# Patient Record
Sex: Female | Born: 1935
Health system: Southern US, Community
[De-identification: ages and names within clinical notes are randomized; demographics above are authoritative.]

## PROBLEM LIST (undated history)

## (undated) DIAGNOSIS — I10 Essential (primary) hypertension: Secondary | ICD-10-CM

## (undated) DIAGNOSIS — R7611 Nonspecific reaction to tuberculin skin test without active tuberculosis: Secondary | ICD-10-CM

## (undated) DIAGNOSIS — R197 Diarrhea, unspecified: Secondary | ICD-10-CM

## (undated) DIAGNOSIS — E1129 Type 2 diabetes mellitus with other diabetic kidney complication: Secondary | ICD-10-CM

## (undated) DIAGNOSIS — J069 Acute upper respiratory infection, unspecified: Secondary | ICD-10-CM

## (undated) DIAGNOSIS — Z9289 Personal history of other medical treatment: Secondary | ICD-10-CM

## (undated) DIAGNOSIS — I639 Cerebral infarction, unspecified: Secondary | ICD-10-CM

## (undated) DIAGNOSIS — I739 Peripheral vascular disease, unspecified: Secondary | ICD-10-CM

## (undated) DIAGNOSIS — E785 Hyperlipidemia, unspecified: Secondary | ICD-10-CM

## (undated) DIAGNOSIS — R079 Chest pain, unspecified: Secondary | ICD-10-CM

## (undated) DIAGNOSIS — D649 Anemia, unspecified: Secondary | ICD-10-CM

## (undated) DIAGNOSIS — M899 Disorder of bone, unspecified: Secondary | ICD-10-CM

## (undated) DIAGNOSIS — I70209 Unspecified atherosclerosis of native arteries of extremities, unspecified extremity: Secondary | ICD-10-CM

## (undated) DIAGNOSIS — R809 Proteinuria, unspecified: Secondary | ICD-10-CM

## (undated) DIAGNOSIS — B0229 Other postherpetic nervous system involvement: Secondary | ICD-10-CM

## (undated) DIAGNOSIS — I96 Gangrene, not elsewhere classified: Secondary | ICD-10-CM

## (undated) DIAGNOSIS — M25569 Pain in unspecified knee: Secondary | ICD-10-CM

## (undated) DIAGNOSIS — N289 Disorder of kidney and ureter, unspecified: Secondary | ICD-10-CM

## (undated) DIAGNOSIS — I129 Hypertensive chronic kidney disease with stage 1 through stage 4 chronic kidney disease, or unspecified chronic kidney disease: Secondary | ICD-10-CM

## (undated) DIAGNOSIS — E1165 Type 2 diabetes mellitus with hyperglycemia: Secondary | ICD-10-CM

## (undated) DIAGNOSIS — E119 Type 2 diabetes mellitus without complications: Secondary | ICD-10-CM

## (undated) DIAGNOSIS — M1990 Unspecified osteoarthritis, unspecified site: Secondary | ICD-10-CM

## (undated) DIAGNOSIS — M949 Disorder of cartilage, unspecified: Secondary | ICD-10-CM

## (undated) DIAGNOSIS — N182 Chronic kidney disease, stage 2 (mild): Secondary | ICD-10-CM

## (undated) HISTORY — DX: Other postherpetic nervous system involvement: B02.29

## (undated) HISTORY — DX: Nonspecific reaction to tuberculin skin test without active tuberculosis: R76.11

## (undated) HISTORY — DX: Disorder of kidney and ureter, unspecified: N28.9

## (undated) HISTORY — DX: Hyperlipidemia, unspecified: E78.5

## (undated) HISTORY — DX: Unspecified atherosclerosis of native arteries of extremities, unspecified extremity: I70.209

## (undated) HISTORY — DX: Type 2 diabetes mellitus with other diabetic kidney complication: E11.29

## (undated) HISTORY — DX: Essential (primary) hypertension: I10

## (undated) HISTORY — PX: TONSILLECTOMY: SUR1361

## (undated) HISTORY — PX: OTHER SURGICAL HISTORY: SHX169

## (undated) HISTORY — DX: Type 2 diabetes mellitus with hyperglycemia: E11.65

## (undated) HISTORY — DX: Diarrhea, unspecified: R19.7

## (undated) HISTORY — DX: Proteinuria, unspecified: R80.9

## (undated) HISTORY — DX: Acute upper respiratory infection, unspecified: J06.9

## (undated) HISTORY — DX: Hypercalcemia: E83.52

## (undated) HISTORY — DX: Hypertensive chronic kidney disease with stage 1 through stage 4 chronic kidney disease, or unspecified chronic kidney disease: I12.9

## (undated) HISTORY — DX: Chest pain, unspecified: R07.9

## (undated) HISTORY — DX: Disorder of bone, unspecified: M94.9

## (undated) HISTORY — DX: Peripheral vascular disease, unspecified: I73.9

## (undated) HISTORY — DX: Pain in unspecified knee: M25.569

## (undated) HISTORY — DX: Anemia, unspecified: D64.9

## (undated) HISTORY — DX: Chronic kidney disease, stage 2 (mild): N18.2

## (undated) HISTORY — DX: Disorder of bone, unspecified: M89.9

## (undated) HISTORY — DX: Cerebral infarction, unspecified: I63.9

## (undated) HISTORY — PX: COLONOSCOPY: SHX174

---

## 2000-01-29 ENCOUNTER — Ambulatory Visit (HOSPITAL_COMMUNITY): Admission: RE | Admit: 2000-01-29 | Discharge: 2000-01-29 | Payer: Self-pay | Admitting: Family Medicine

## 2001-05-28 ENCOUNTER — Emergency Department (HOSPITAL_COMMUNITY): Admission: EM | Admit: 2001-05-28 | Discharge: 2001-05-28 | Payer: Self-pay | Admitting: Emergency Medicine

## 2001-05-28 ENCOUNTER — Encounter: Payer: Self-pay | Admitting: Emergency Medicine

## 2004-02-20 ENCOUNTER — Encounter: Admission: RE | Admit: 2004-02-20 | Discharge: 2004-02-20 | Payer: Self-pay | Admitting: Cardiology

## 2004-03-03 ENCOUNTER — Ambulatory Visit (HOSPITAL_COMMUNITY): Admission: RE | Admit: 2004-03-03 | Discharge: 2004-03-03 | Payer: Self-pay | Admitting: *Deleted

## 2004-03-09 ENCOUNTER — Encounter: Admission: RE | Admit: 2004-03-09 | Discharge: 2004-03-09 | Payer: Self-pay | Admitting: Cardiology

## 2004-04-06 ENCOUNTER — Encounter: Admission: RE | Admit: 2004-04-06 | Discharge: 2004-07-05 | Payer: Self-pay | Admitting: Cardiology

## 2004-05-01 ENCOUNTER — Ambulatory Visit (HOSPITAL_COMMUNITY): Admission: RE | Admit: 2004-05-01 | Discharge: 2004-05-01 | Payer: Self-pay | Admitting: Cardiology

## 2004-06-25 ENCOUNTER — Encounter (HOSPITAL_COMMUNITY): Admission: RE | Admit: 2004-06-25 | Discharge: 2004-09-23 | Payer: Self-pay | Admitting: Gastroenterology

## 2004-07-02 ENCOUNTER — Encounter (INDEPENDENT_AMBULATORY_CARE_PROVIDER_SITE_OTHER): Payer: Self-pay | Admitting: *Deleted

## 2004-07-02 ENCOUNTER — Ambulatory Visit (HOSPITAL_COMMUNITY): Admission: RE | Admit: 2004-07-02 | Discharge: 2004-07-02 | Payer: Self-pay | Admitting: Gastroenterology

## 2005-01-30 ENCOUNTER — Emergency Department (HOSPITAL_COMMUNITY): Admission: EM | Admit: 2005-01-30 | Discharge: 2005-01-30 | Payer: Self-pay | Admitting: Emergency Medicine

## 2006-02-14 ENCOUNTER — Encounter: Admission: RE | Admit: 2006-02-14 | Discharge: 2006-02-14 | Payer: Self-pay | Admitting: Cardiology

## 2006-09-05 ENCOUNTER — Emergency Department (HOSPITAL_COMMUNITY): Admission: EM | Admit: 2006-09-05 | Discharge: 2006-09-05 | Payer: Self-pay | Admitting: Emergency Medicine

## 2006-09-06 ENCOUNTER — Emergency Department (HOSPITAL_COMMUNITY): Admission: EM | Admit: 2006-09-06 | Discharge: 2006-09-06 | Payer: Self-pay | Admitting: Family Medicine

## 2007-01-10 ENCOUNTER — Emergency Department (HOSPITAL_COMMUNITY): Admission: EM | Admit: 2007-01-10 | Discharge: 2007-01-10 | Payer: Self-pay | Admitting: Emergency Medicine

## 2007-02-15 ENCOUNTER — Encounter: Admission: RE | Admit: 2007-02-15 | Discharge: 2007-02-15 | Payer: Self-pay | Admitting: Orthopedic Surgery

## 2007-11-17 ENCOUNTER — Encounter: Admission: RE | Admit: 2007-11-17 | Discharge: 2007-11-17 | Payer: Self-pay | Admitting: Family Medicine

## 2009-11-21 ENCOUNTER — Encounter: Admission: RE | Admit: 2009-11-21 | Discharge: 2009-11-21 | Payer: Self-pay | Admitting: Sports Medicine

## 2009-12-24 LAB — HM DEXA SCAN

## 2010-01-14 ENCOUNTER — Encounter: Admission: RE | Admit: 2010-01-14 | Discharge: 2010-01-14 | Payer: Self-pay | Admitting: Internal Medicine

## 2010-05-19 ENCOUNTER — Encounter: Admission: RE | Admit: 2010-05-19 | Discharge: 2010-05-19 | Payer: Self-pay | Admitting: Internal Medicine

## 2011-05-14 NOTE — Op Note (Signed)
NAME:  AVERII, Monique Cabrera                        ACCOUNT NO.:  [id number]   MEDICAL RECORD NO.:  [id number]                   PATIENT TYPE:  AMB   LOCATION:  ENDO                                 FACILITY:  [facility]   PHYSICIAN:  Jeryl Columbia, M.D.                 DATE OF BIRTH:  08-May-1935   DATE OF PROCEDURE:  07/02/2004  DATE OF DISCHARGE:                                 OPERATIVE REPORT   PROCEDURE:  Colonoscopy with polypectomy.   INDICATIONS:  Iron deficiency, guaiac positivity.   Consent was signed after risks, benefits, methods, options thoroughly  discussed in the office.   MEDICINES USED:  Demerol 70, Versed 7.   PROCEDURE:  Rectal inspection is pertinent for external hemorrhoids, small.  Digital exam was negative.  The video pediatric adjustable colonoscope was  inserted and easily advanced around the colon to the cecum.  This did  require some abdominal pressure but no position changes.  No signs of  bleeding were seen on insertion.  The cecum is identified by the appendiceal  orifice and the ileocecal valve.  The scope was inserted short-ways in the  terminal ileum, which was normal.  No blood was seen coming from above.  The  scope was slowly withdrawn.  The prep was adequate.  There was some liquid  stool that required washing and suctioning, but on slow withdrawal through  the colon, the cecum, ascending, transverse, and descending were normal.  As  the scope was withdrawn around the sigmoid, in the distal sigmoid, a small  polyp was seen.  Snare electrocautery was applied, and the polyp was  suctioned through the scope and collected in the trap.  There was a nice  white coagulum without any obvious residual polypoid tissue.  She had some  difficulty holding air.  No other abnormality was seen as we slowly withdrew  back to the rectum.  Anorectal pull-through and retroflexion confirmed some  small hemorrhoids.  The scope was reinserted short-ways up the left side of  the colon.  Air was suctioned.  Scope removed.  The patient tolerated the  procedure well.  There were no obvious immediate complications.   ENDOSCOPIC DIAGNOSIS:  1. Internal and external hemorrhoids.  2. One small sigmoid polyp, snared.  3. Otherwise within normal limits to the terminal ileum without any signs of     bleeding.   PLAN:  Will await pathology.  Probably recheck colon screening in five years  and continue workup with a one-time EGD.                                               Jeryl Columbia, M.D.    MEM/MEDQ  D:  07/02/2004  T:  07/02/2004  Job:  [id number]   cc:  Ardyth Gal. Spruill, M.D.  [address] [place] [address]  Fax: [phone]

## 2011-05-14 NOTE — Op Note (Signed)
NAME:  Monique Cabrera, Monique Cabrera                        ACCOUNT NO.:  [id number]   MEDICAL RECORD NO.:  [id number]                   PATIENT TYPE:  AMB   LOCATION:  ENDO                                 FACILITY:  [facility]   PHYSICIAN:  Jeryl Columbia, M.D.                 DATE OF BIRTH:  Sep 02, 1935   DATE OF PROCEDURE:  07/02/2004  DATE OF DISCHARGE:                                 OPERATIVE REPORT   PROCEDURE:  Esophagogastroduodenoscopy with biopsy.   INDICATIONS:  Upper tract symptoms, history of ulcers.  Iron deficiency and  guaiac positivity on Plavix.  Nondiagnostic colonoscopy.   Consent was signed prior to any premeds given after risks, benefits,  methods, options thoroughly discussed in the office.  Additional medicines  for this procedure, 2 of Versed only.   PROCEDURE:  The video endoscope was inserted by direct vision.  The  procedure mid esophagus was normal.  The distal esophagus did have a small  hiatal hernia with a widely patent small fibrous ring.  The scope was passed  easily into the stomach, where some atrophic gastritis was seen.  Advanced  through a normal antrum, normal pylorus, into a normal duodenal bulb and  around the C loop to a normal second portion of the duodenum.  No blood was  seen distally.  The scope was slowly withdrawn back into the bulb, and a  good look there ruled out abnormalities in all locations.  The scope was  withdrawn back to the stomach and retroflexed.  High in the cardia, a small  submucosal, probably lipoma, was seen with nice, smooth round circumference  and normal overlying mucosa.  This was right at the cardia.  Photo  documentation was obtained.  The fundus and angularis were normal, as were  the lesser and greater curve except for some atrophic gastritis.  Straight  visualization in the stomach confirmed the atrophic gastritis.  Ruled out  any other abnormalities.  A few biopsies of the gastritis were obtained.  Air was suctioned.  The  scope was slowly withdrawn.  Again, a good look at  the esophagus was normal.  The scope was removed.  The patient tolerated the  procedure well.  There were no obvious immediate complications.   ENDOSCOPIC DIAGNOSIS:  1. Small hiatal hernia with a widely patent ring.  2. Atrophic gastritis, status post biopsy.  3. Cardia, probably submucosal lipoma, small and smooth without worrisome     features.  4. Otherwise normal esophagogastroduodenoscopy without any bleeding.   PLAN:  Hold aspirin and Plavix for now.  Long-term pump inhibitors.  Will  try to use only one of the above at some point in the future if definitely  needed, per Dr. Montez Morita.  In the meantime, would go ahead and begin iron.  Follow up in six weeks to recheck guaiac and CBC symptoms and make sure no  further workup plans are needed.  I would like a one-time small bowel follow-  through or capsule endoscopy.                                               Jeryl Columbia, M.D.    MEM/MEDQ  D:  07/02/2004  T:  07/02/2004  Job:  [id number]   cc:   Ardyth Gal. Spruill, M.D.  [address] [place] [address]  Fax: [phone]

## 2011-12-03 LAB — HM MAMMOGRAPHY: HM Mammogram: NEGATIVE

## 2012-01-11 ENCOUNTER — Ambulatory Visit: Payer: Self-pay

## 2012-08-21 ENCOUNTER — Encounter (HOSPITAL_COMMUNITY): Payer: Self-pay

## 2012-08-21 ENCOUNTER — Emergency Department (HOSPITAL_COMMUNITY): Payer: Medicare Other

## 2012-08-21 ENCOUNTER — Emergency Department (HOSPITAL_COMMUNITY)
Admission: EM | Admit: 2012-08-21 | Discharge: 2012-08-21 | Disposition: A | Discharge status: Home Health | Payer: Medicare Other | Attending: Emergency Medicine | Admitting: Emergency Medicine

## 2012-08-21 DIAGNOSIS — Z7982 Long term (current) use of aspirin: Secondary | ICD-10-CM | POA: Insufficient documentation

## 2012-08-21 DIAGNOSIS — Z79899 Other long term (current) drug therapy: Secondary | ICD-10-CM | POA: Insufficient documentation

## 2012-08-21 DIAGNOSIS — I10 Essential (primary) hypertension: Secondary | ICD-10-CM | POA: Insufficient documentation

## 2012-08-21 DIAGNOSIS — E119 Type 2 diabetes mellitus without complications: Secondary | ICD-10-CM | POA: Insufficient documentation

## 2012-08-21 DIAGNOSIS — R109 Unspecified abdominal pain: Secondary | ICD-10-CM

## 2012-08-21 HISTORY — DX: Essential (primary) hypertension: I10

## 2012-08-21 LAB — CBC WITH DIFFERENTIAL/PLATELET
Basophils Relative: 0 % (ref 0–1)
Eosinophils Absolute: 0.1 10*3/uL (ref 0.0–0.7)
HCT: 37.2 % (ref 36.0–46.0)
Hemoglobin: 12.5 g/dL (ref 12.0–15.0)
Lymphs Abs: 1.8 10*3/uL (ref 0.7–4.0)
Monocytes Absolute: 0.6 10*3/uL (ref 0.1–1.0)
Platelets: 245 10*3/uL (ref 150–400)
RBC: 4.15 MIL/uL (ref 3.87–5.11)
RDW: 12.5 % (ref 11.5–15.5)
WBC: 6.7 10*3/uL (ref 4.0–10.5)

## 2012-08-21 LAB — BASIC METABOLIC PANEL
Calcium: 10.7 mg/dL — ABNORMAL HIGH (ref 8.4–10.5)
Chloride: 100 mEq/L (ref 96–112)
Creatinine, Ser: 1.12 mg/dL — ABNORMAL HIGH (ref 0.50–1.10)
GFR calc Af Amer: 54 mL/min — ABNORMAL LOW (ref >90)
GFR calc non Af Amer: 47 mL/min — ABNORMAL LOW (ref >90)

## 2012-08-21 LAB — URINALYSIS, ROUTINE W REFLEX MICROSCOPIC
Bilirubin Urine: NEGATIVE
Protein, ur: 100 mg/dL — AB
Specific Gravity, Urine: 1.021 (ref 1.005–1.030)
pH: 5.5 (ref 5.0–8.0)

## 2012-08-21 LAB — HEPATIC FUNCTION PANEL
ALT: 14 U/L (ref 0–35)
AST: 20 U/L (ref 0–37)
Albumin: 4.2 g/dL (ref 3.5–5.2)
Alkaline Phosphatase: 57 U/L (ref 39–117)
Total Bilirubin: 0.3 mg/dL (ref 0.3–1.2)
Total Protein: 7.6 g/dL (ref 6.0–8.3)

## 2012-08-21 LAB — URINE MICROSCOPIC-ADD ON

## 2012-08-21 MED ORDER — MORPHINE SULFATE 4 MG/ML IJ SOLN
4.0000 mg | Freq: Once | INTRAMUSCULAR | Status: AC
  Administered 2012-08-21: 4 mg via INTRAVENOUS
  Filled 2012-08-21: qty 1

## 2012-08-21 MED ORDER — HYDROCODONE-ACETAMINOPHEN 5-500 MG PO TABS: 1.0000 | ORAL_TABLET | Freq: Four times a day (QID) | ORAL | Status: AC | PRN

## 2012-08-21 NOTE — ED Notes (Signed)
Flank pain on left side, seen md for same.

## 2012-08-21 NOTE — ED Provider Notes (Signed)
History     CSN: [id number]  Arrival date & time 08/21/12  1403   First MD Initiated Contact with Patient 08/21/12 1609      Chief Complaint  Patient presents with  . Flank Pain    (Consider location/radiation/quality/duration/timing/severity/associated sxs/prior treatment) Patient is a 77 y.o. female presenting with flank pain. The history is provided by the patient.  Flank Pain This is a new problem. The current episode started in the past 7 days. The problem occurs intermittently. The problem has been gradually worsening. Associated symptoms include abdominal pain (LUQ). Pertinent negatives include no chest pain, chills, coughing, fever, nausea, numbness, vomiting or weakness. Exacerbated by: laying down at night. She has tried nothing for the symptoms.    Past Medical History  Diagnosis Date  . Diabetes mellitus   . Hypertension     No past surgical history on file.  No family history on file.  History  Substance Use Topics  . Smoking status: Never Smoker   . Smokeless tobacco: Not on file  . Alcohol Use: No    OB History    Grav Para Term Preterm Abortions TAB SAB Ect Mult Living                  Review of Systems  Constitutional: Negative for fever and chills.  Respiratory: Negative for cough and shortness of breath.   Cardiovascular: Negative for chest pain and leg swelling.  Gastrointestinal: Positive for abdominal pain (LUQ). Negative for nausea, vomiting, diarrhea and constipation.  Genitourinary: Positive for flank pain. Negative for dysuria, urgency, frequency, hematuria, decreased urine volume and difficulty urinating.  Musculoskeletal: Positive for back pain (left back).  Neurological: Negative for weakness and numbness.  All other systems reviewed and are negative.    Allergies  Review of patient's allergies indicates no known allergies.  Home Medications   Current Outpatient Rx  Name Route Sig Dispense Refill  . AMLODIPINE BESYLATE 10 MG  PO TABS Oral Take 10 mg by mouth daily.    . ASPIRIN 81 MG PO TABS Oral Take 81 mg by mouth daily.    Marland Kitchen CARVEDILOL 25 MG PO TABS Oral Take 25 mg by mouth 2 (two) times daily with a meal.    . CLOPIDOGREL BISULFATE 75 MG PO TABS Oral Take 75 mg by mouth daily.    . ENALAPRIL MALEATE 10 MG PO TABS Oral Take 10 mg by mouth daily.    . INSULIN GLARGINE 100 UNIT/ML [place] SOLN Subcutaneous Inject 44 Units into the skin at bedtime.    Marland Kitchen LOVASTATIN 20 MG PO TABS Oral Take 20 mg by mouth at bedtime.    Marland Kitchen METFORMIN HCL 500 MG PO TABS Oral Take 1,000 mg by mouth 2 (two) times daily with a meal.    . VALSARTAN-HYDROCHLOROTHIAZIDE 320-25 MG PO TABS Oral Take 1 tablet by mouth daily.      BP 140/57  Pulse 65  Temp 98.3 F (36.8 C) (Oral)  Resp 18  SpO2 100%  Physical Exam  Nursing note and vitals reviewed. Constitutional: She is oriented to person, place, and time. She appears well-developed and well-nourished. No distress.  HENT:  Head: Normocephalic and atraumatic.  Right Ear: External ear normal.  Left Ear: External ear normal.  Nose: Nose normal.  Mouth/Throat: Oropharynx is clear and moist.  Eyes: Right eye exhibits no discharge. Left eye exhibits no discharge.  Neck: Neck supple.  Cardiovascular: Normal rate, regular rhythm, normal heart sounds and intact distal pulses.  Pulmonary/Chest: Effort normal and breath sounds normal. No respiratory distress. She has no wheezes. She has no rales.  Abdominal: Soft. She exhibits no distension. There is tenderness in the left upper quadrant. There is CVA tenderness (left sided).  Musculoskeletal: She exhibits no edema.  Neurological: She is alert and oriented to person, place, and time.  Skin: Skin is warm and dry. She is not diaphoretic. No pallor.    ED Course  Procedures (including critical care time)  Labs Reviewed  BASIC METABOLIC PANEL - Abnormal; Notable for the following:    Glucose, Bld 131 (*)     Creatinine, Ser 1.12 (*)     Calcium  10.7 (*)     GFR calc non Af Amer 47 (*)     GFR calc Af Amer 54 (*)     All other components within normal limits  URINALYSIS, ROUTINE W REFLEX MICROSCOPIC - Abnormal; Notable for the following:    APPearance CLOUDY (*)     Protein, ur 100 (*)     Leukocytes, UA TRACE (*)     All other components within normal limits  URINE MICROSCOPIC-ADD ON - Abnormal; Notable for the following:    Squamous Epithelial / LPF MANY (*)     Bacteria, UA MANY (*)     Casts HYALINE CASTS (*)     All other components within normal limits  CBC WITH DIFFERENTIAL  HEPATIC FUNCTION PANEL  LIPASE, BLOOD  URINE CULTURE   Ct Abdomen Pelvis Wo Contrast  08/21/2012  *RADIOLOGY REPORT*  Clinical Data: Left flank pain.  CT ABDOMEN AND PELVIS WITHOUT CONTRAST  Technique:  Multidetector CT imaging of the abdomen and pelvis was performed following the standard protocol without intravenous contrast.  Comparison: 11/17/2007  Findings: There are no renal or ureteral calculi.  No hydronephrosis.  There is an area of parenchymal scarring in the tip of the upper pole of the right kidney and there is a parapelvic cyst in the lower pole of the left kidney.  Both of these areas are minimally more prominent than on 11/17/2007.  There are multiple small benign-appearing cysts in the liver, minimally larger than on the prior exam.  There are multiple vascular calcifications in the spleen.  Pancreas and adrenal glands are normal.  There is a small hiatal hernia.  The bowel is normal including the terminal ileum and appendix.  No diverticular disease.  Uterus has been removed.  Ovaries are normal.  There is mild bilateral arthritis in the hips.  There is fairly severe bilateral facet arthritis in the lower lumbar spine particularly at L4-5 and L5-S1.  IMPRESSION: No acute abnormalities of the abdomen or pelvis.   Original Report Authenticated By: Larey Seat, M.D.      1. Left flank pain       MDM  77 yo female with 4-5 days of  left flank and back pain. Intermittently worsens but patient appears comfortable here. Urine with dirty catch, but no specific urine symptoms. Sent for culture but at this time will not treat. Abd exam benign. With anterior and posterior LUQ and left back symptoms. With tenderness in multiple locations atypical cardiac very unlikely. No abnormal lung sounds or cough to suggest PNA. No rash, but could be early zoster. D/w patient about this and need for PCP follow up and to watch for rash.        Sherwood Gambler, MD 08/21/12 2350

## 2012-08-22 NOTE — ED Provider Notes (Signed)
I saw and evaluated the patient, reviewed the resident's note and I agree with the findings and plan. Patient with flank pain. Negative x-ray and CT. Urinalysis was not clear infection but culture was sent. Patient was discharged home  Jasper Riling. Alvino Chapel, MD 08/22/12 1436

## 2012-08-23 LAB — URINE CULTURE: Colony Count: >=100000

## 2012-12-05 LAB — HM MAMMOGRAPHY: HM Mammogram: NEGATIVE

## 2013-03-23 ENCOUNTER — Other Ambulatory Visit: Payer: Self-pay | Admitting: *Deleted

## 2013-03-23 DIAGNOSIS — E1129 Type 2 diabetes mellitus with other diabetic kidney complication: Secondary | ICD-10-CM

## 2013-03-23 DIAGNOSIS — E785 Hyperlipidemia, unspecified: Secondary | ICD-10-CM

## 2013-03-23 DIAGNOSIS — D649 Anemia, unspecified: Secondary | ICD-10-CM

## 2013-04-05 ENCOUNTER — Other Ambulatory Visit: Payer: Self-pay

## 2013-04-06 ENCOUNTER — Other Ambulatory Visit: Payer: Medicare Other

## 2013-04-09 ENCOUNTER — Ambulatory Visit: Payer: Self-pay | Admitting: Pharmacotherapy

## 2013-04-12 ENCOUNTER — Other Ambulatory Visit: Payer: Medicare Other

## 2013-04-12 DIAGNOSIS — D649 Anemia, unspecified: Secondary | ICD-10-CM

## 2013-04-12 DIAGNOSIS — E785 Hyperlipidemia, unspecified: Secondary | ICD-10-CM

## 2013-04-12 DIAGNOSIS — E1129 Type 2 diabetes mellitus with other diabetic kidney complication: Secondary | ICD-10-CM

## 2013-04-13 ENCOUNTER — Encounter: Payer: Self-pay | Admitting: *Deleted

## 2013-04-13 LAB — CBC WITH DIFFERENTIAL/PLATELET
Basophils Absolute: 0 10*3/uL (ref 0.0–0.2)
Basos: 1 % (ref 0–3)
Eos: 3 % (ref 0–5)
Eosinophils Absolute: 0.2 10*3/uL (ref 0.0–0.4)
HCT: 33.5 % — ABNORMAL LOW (ref 34.0–46.6)
Hemoglobin: 11.3 g/dL (ref 11.1–15.9)
Immature Grans (Abs): 0 10*3/uL (ref 0.0–0.1)
Immature Granulocytes: 0 % (ref 0–2)
Lymphocytes Absolute: 2.1 10*3/uL (ref 0.7–3.1)
Lymphs: 36 % (ref 14–46)
MCH: 31 pg (ref 26.6–33.0)
MCHC: 33.7 g/dL (ref 31.5–35.7)
MCV: 92 fL (ref 79–97)
Monocytes Absolute: 0.5 10*3/uL (ref 0.1–0.9)
Monocytes: 8 % (ref 4–12)
Neutrophils Absolute: 3 10*3/uL (ref 1.4–7.0)
Neutrophils Relative %: 52 % (ref 40–74)
RBC: 3.64 x10E6/uL — ABNORMAL LOW (ref 3.77–5.28)
RDW: 13.8 % (ref 12.3–15.4)
WBC: 5.8 10*3/uL (ref 3.4–10.8)

## 2013-04-13 LAB — BASIC METABOLIC PANEL
BUN/Creatinine Ratio: 15 (ref 11–26)
BUN: 16 mg/dL (ref 8–27)
CO2: 24 mmol/L (ref 19–28)
Calcium: 10.1 mg/dL (ref 8.6–10.2)
Chloride: 107 mmol/L (ref 97–108)
Creatinine, Ser: 1.07 mg/dL — ABNORMAL HIGH (ref 0.57–1.00)
GFR calc Af Amer: 59 mL/min/{1.73_m2} — ABNORMAL LOW (ref >59)
GFR calc non Af Amer: 51 mL/min/{1.73_m2} — ABNORMAL LOW (ref >59)
Glucose: 79 mg/dL (ref 65–99)
Potassium: 4.3 mmol/L (ref 3.5–5.2)
Sodium: 142 mmol/L (ref 134–144)

## 2013-04-13 LAB — HEMOGLOBIN A1C
Est. average glucose Bld gHb Est-mCnc: 171 mg/dL
Hgb A1c MFr Bld: 7.6 % — ABNORMAL HIGH (ref 4.8–5.6)

## 2013-04-16 ENCOUNTER — Ambulatory Visit (INDEPENDENT_AMBULATORY_CARE_PROVIDER_SITE_OTHER): Payer: Medicare Other | Admitting: Internal Medicine

## 2013-04-16 ENCOUNTER — Encounter: Payer: Self-pay | Admitting: Internal Medicine

## 2013-04-16 VITALS — BP 132/68 | HR 67 | Temp 98.0°F | Resp 16 | Ht 65.0 in | Wt 152.0 lb

## 2013-04-16 DIAGNOSIS — E785 Hyperlipidemia, unspecified: Secondary | ICD-10-CM | POA: Insufficient documentation

## 2013-04-16 DIAGNOSIS — I739 Peripheral vascular disease, unspecified: Secondary | ICD-10-CM

## 2013-04-16 DIAGNOSIS — D649 Anemia, unspecified: Secondary | ICD-10-CM | POA: Insufficient documentation

## 2013-04-16 DIAGNOSIS — B0229 Other postherpetic nervous system involvement: Secondary | ICD-10-CM | POA: Insufficient documentation

## 2013-04-16 DIAGNOSIS — E162 Hypoglycemia, unspecified: Secondary | ICD-10-CM | POA: Insufficient documentation

## 2013-04-16 DIAGNOSIS — M949 Disorder of cartilage, unspecified: Secondary | ICD-10-CM

## 2013-04-16 DIAGNOSIS — E1129 Type 2 diabetes mellitus with other diabetic kidney complication: Secondary | ICD-10-CM

## 2013-04-16 DIAGNOSIS — M899 Disorder of bone, unspecified: Secondary | ICD-10-CM | POA: Insufficient documentation

## 2013-04-16 DIAGNOSIS — I70209 Unspecified atherosclerosis of native arteries of extremities, unspecified extremity: Secondary | ICD-10-CM

## 2013-04-16 DIAGNOSIS — I10 Essential (primary) hypertension: Secondary | ICD-10-CM | POA: Insufficient documentation

## 2013-04-16 DIAGNOSIS — N182 Chronic kidney disease, stage 2 (mild): Secondary | ICD-10-CM | POA: Insufficient documentation

## 2013-04-16 DIAGNOSIS — N058 Unspecified nephritic syndrome with other morphologic changes: Secondary | ICD-10-CM

## 2013-04-16 NOTE — Assessment & Plan Note (Signed)
Had low glucose one morning (presumably).  Needs to eat breakfast in morning before work--is aware of this and has been since that day. Discussed importance of checking glucose before breakfast and after at least one meal a day.

## 2013-04-16 NOTE — Progress Notes (Signed)
Patient ID: Monique Cabrera, female   DOB: 1935/10/27, 78 y.o.   MRN: [id number]  No Known Allergies  Chief Complaint  Patient presents with  . Medical Managment of Chronic Issues    patient had an unexplained episode of confusion that lasted 30 minutes about 7 days ago    HPI: Patient is a 78 y.o.  seen in the office today for medical management of chronic diseases.  Had episode at work where she felt fine until suddenly.  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  Felt like she couldn't see the words.  Was feeling strange.  Got worse and worse for 30 mins.  Usually if it's sugar, she gets nervous and shaky.  That didn't happen this time.  She couldn't concentrate and focus.  Put her head down, but couldn't answer people asking her what was wrong.  Was brought home.  Got OJ at home thinking she was hypoglycemic.  Had skipped breakfast that morning.  Did not take humalog that morning b/c glucose was less than 150.  Did feel better soon after the OJ and cleared up.  Does not check glucose regularly.  Sometimes checks in am and at night.    Had gotten her heart results which were ok.  Saw Dr. Einar Gip.  Given ntg pills to take when she has any chest pain.  He seemed to think her symptoms were more acid reflux.    Review of Systems:  Review of Systems  Constitutional: Negative for fever, chills, weight loss and malaise/fatigue.  Eyes: Negative for blurred vision.  Respiratory: Negative for shortness of breath.   Cardiovascular: Negative for chest pain.  Gastrointestinal: Positive for heartburn. Negative for nausea, vomiting, abdominal pain and constipation.  Genitourinary: Negative for dysuria.  Musculoskeletal: Negative for myalgias and falls.  Skin: Negative for rash.  Neurological: Negative for weakness and headaches.       See hpi  Psychiatric/Behavioral: Negative for depression and memory loss.     Past Medical History  Diagnosis Date  .  Diabetes mellitus   . Hypertension   . Chest pain, unspecified   . Herpes zoster with other nervous system complications   . Nonspecific reaction to tuberculin skin test without active tuberculosis   . Acute upper respiratory infections of unspecified site   . Type II or unspecified type diabetes mellitus with renal manifestations, uncontrolled(250.42)   . Hypertensive renal disease, benign   . Proteinuria   . Type II or unspecified type diabetes mellitus with renal manifestations, not stated as uncontrolled(250.40)   . Anemia, unspecified   . Unspecified disorder of kidney and ureter   . Disorder of bone and cartilage, unspecified   . Pain in joint, lower leg   . Diarrhea   . Hypercalcemia   . Type II or unspecified type diabetes mellitus without mention of complication, uncontrolled   . Type II or unspecified type diabetes mellitus without mention of complication, not stated as uncontrolled   . Other and unspecified hyperlipidemia   . Unspecified essential hypertension   . Atherosclerosis of native arteries of the extremities, unspecified    Past Surgical History  Procedure Laterality Date  . Removal of cyst from hand    . Hysterectomy    . Tonsillectomy     Social History:   reports that she has quit smoking. Her smoking use included Cigarettes. She smoked 0.00 packs per day. She does not have any smokeless tobacco  history on file. She reports that she does not drink alcohol or use illicit drugs.  Family History  Problem Relation Age of Onset  . Diabetes Mother   . Diabetes Father   . Diabetes Sister   . Diabetes Sister     Medications: Patient's Medications  New Prescriptions   No medications on file  Previous Medications   AMLODIPINE (NORVASC) 10 MG TABLET    Take 10 mg by mouth daily.   CARVEDILOL (COREG) 25 MG TABLET    Take 25 mg by mouth 2 (two) times daily with a meal.   CLOPIDOGREL (PLAVIX) 75 MG TABLET    Take 75 mg by mouth daily.   INSULIN GLARGINE  (LANTUS) 100 UNIT/ML INJECTION    Inject 44 Units into the skin at bedtime.   INSULIN LISPRO (HUMALOG KWIKPEN) 100 UNIT/ML INJECTION    Inject into the skin 3 (three) times daily before meals. Inject 5 units before every meal for diabetes Dx. 250.00   LOVASTATIN (MEVACOR) 20 MG TABLET    Take 20 mg by mouth at bedtime.   METFORMIN (GLUCOPHAGE) 500 MG TABLET    Take 1,000 mg by mouth 2 (two) times daily with a meal.   NITROSTAT 0.4 MG SL TABLET       OMEPRAZOLE (PRILOSEC) 20 MG CAPSULE       VALSARTAN-HYDROCHLOROTHIAZIDE (DIOVAN-HCT) 320-25 MG PER TABLET    Take 1 tablet by mouth daily.  Modified Medications   No medications on file  Discontinued Medications   ENALAPRIL (VASOTEC) 10 MG TABLET       LIDOCAINE (LIDODERM) 5 %    Place 1 patch onto the skin daily. Remove & Discard patch within 12 hours for shingles     Physical Exam:  Filed Vitals:   04/16/13 1332  BP: 132/68  Pulse: 67  Temp: 98 F (36.7 C)  TempSrc: Oral  Resp: 16  Height: 5\' 5"  (1.651 m)  Weight: 152 lb (68.947 kg)  SpO2: 99%  Physical Exam  Constitutional: She is oriented to person, place, and time. No distress.  HENT:  Head: Normocephalic and atraumatic.  Eyes: EOM are normal. Pupils are equal, round, and reactive to light.  Cardiovascular: Normal rate, regular rhythm, normal heart sounds and intact distal pulses.   Pulmonary/Chest: Effort normal and breath sounds normal.  Abdominal: Soft. Bowel sounds are normal. She exhibits no distension. There is no tenderness.  Musculoskeletal: Normal range of motion. She exhibits no edema and no tenderness.  Neurological: She is alert and oriented to person, place, and time. She has normal reflexes. No cranial nerve deficit. She exhibits normal muscle tone. Coordination normal.  Skin: Skin is warm and dry.       Labs reviewed: Basic Metabolic Panel:  Recent Labs  08/21/12 1633 04/12/13 0901  NA 138 142  K 4.2 4.3  CL 100 107  CO2 25 24  GLUCOSE 131* 79   BUN 19 16  CREATININE 1.12* 1.07*  CALCIUM 10.7* 10.1   Liver Function Tests:  Recent Labs  08/21/12 1633  AST 20  ALT 14  ALKPHOS 57  BILITOT 0.3  PROT 7.6  ALBUMIN 4.2    Recent Labs  08/21/12 1633  LIPASE 24   No results found for this basename: AMMONIA,  in the last 8760 hours CBC:  Recent Labs  08/21/12 1633 04/12/13 0901  WBC 6.7 5.8  NEUTROABS 4.2 3.0  HGB 12.5 11.3  HCT 37.2 33.5*  MCV 89.6 92  PLT 245  --  03/04/2011 CMP: Glucose 108, BUN 19, Creatinine 0.96 HgbA1c 7.4 Urine Microalbumin  341.7 Lipid: Cholesterol 143, Triglycerides 68, HDL 56, VLDL 14, LDL 73 05/28/2011 CMP: Glucose 106, BUN 23, Creatinine 1.09, Calcium 10.3 HA1C: 7.6 Lipid: Cholesterol 142, Triglycerides 79, HDL 50, VLDL 16, LDL 76 10/01/2011 CMP: Glucose 165, BUN 24, Creatinine 1.16 HA1C: 7.3 Lipid: CHolesterol 168, Trilgyceride 96, HDL 52VLDL 19, LDL 97 01/24/2012 CMP Glucose 124 Bun 20 Creatinine 1.17  HA1C 7.2  Lipid Panel Cholesterol 144 Triglycerides 70 Hdl 61 Ldl 69  Microalbumin Random 135.4  05/25/2012 CMP Glucose 97Bun 23 Creatinine 1.08 HA1C 8.5 08/03/2012  CMP: glucose 218, BUN 16, Creatinine 1.11 A1C: 7.6 12/11/12 HbA1c 8.5 Trig Chol 70, LDL 144, HDL 69  Past Procedures: 12/24/2009-Bone Density: Osteopenia 01/14/2010-Chest X-Ray: No acute findings 04/10/2010-Arterial Doppler: Occluded mid to distal right superficial femoral artery. Occlusion of the distal right posterior tibial artery. Occluded mid to distal left SFA and popliteal artery. Occlusion of the left posterior tibial artery. Resting ankle brachial indices suggest severe disease bilaterally. 05/19/2010-CT Head: No acute intracranial abnormality. Mild white matter changes which are nonspecific 11/18/2010-Mammogram: Negative 12/03/2011-Mammogram: Negative  Assessment/Plan 1. Disorder of bone and cartilage, unspecified 2010 had osteopenia on bone density, needs f/u on this and vitamin D level in the future.  2.  Other and unspecified hyperlipidemia F/u FLP before next visit  3. Unspecified essential hypertension Goal <130/80 due to her vasculopathy   4. Atherosclerosis of native arteries of the extremities, unspecified Just added this to her ongoing chronic problems  5. Chronic kidney disease (CKD), stage II (mild) F/u bmp, due to DMII and vascular disease, avoid nsaids, maintain good hydration  6. Peripheral arterial disease -stable, no new symptoms or signs of ischemia  7. Anemia -stable  8. Postherpetic neuralgia Over abdomen where had shingles  9. DM (diabetes mellitus) type II controlled with renal manifestation --need to f/u on level of control - Hemoglobin A1c; Future - Lipid panel; Future  10. Hypertension -cont current meds, nothing to suggest hypotension or bradycardia caused the event.   - Basic metabolic panel; Future  11. Hypoglycemia Suspect this was the etiology of her symptoms--advised to check her glucose regularly each morning fasting  Labs/tests ordered:  Bmp, flp, hba1c

## 2013-04-16 NOTE — Patient Instructions (Signed)
Check your sugar before breakfast and alternating after breakfast, lunch, or dinner and write it in your book.  Bring this for me and Cathey to see.  Follow the diet I gave you today, and make sure to eat breakfast to prevent low sugar.

## 2013-04-16 NOTE — Assessment & Plan Note (Signed)
Last hbac 7.6.  Likes to snack on fruit.  Suspect she is having postprandial highs and lows fasting, but is not consistent about glucose checks.

## 2013-04-23 ENCOUNTER — Ambulatory Visit (INDEPENDENT_AMBULATORY_CARE_PROVIDER_SITE_OTHER): Payer: Medicare Other | Admitting: Pharmacotherapy

## 2013-04-23 ENCOUNTER — Encounter: Payer: Self-pay | Admitting: Pharmacotherapy

## 2013-04-23 VITALS — BP 126/62 | HR 66 | Temp 98.2°F | Resp 15 | Wt 152.4 lb

## 2013-04-23 DIAGNOSIS — N182 Chronic kidney disease, stage 2 (mild): Secondary | ICD-10-CM

## 2013-04-23 DIAGNOSIS — E785 Hyperlipidemia, unspecified: Secondary | ICD-10-CM

## 2013-04-23 DIAGNOSIS — I10 Essential (primary) hypertension: Secondary | ICD-10-CM

## 2013-04-23 DIAGNOSIS — E1129 Type 2 diabetes mellitus with other diabetic kidney complication: Secondary | ICD-10-CM

## 2013-04-23 NOTE — Progress Notes (Signed)
  Subjective:    Monique Cabrera is a 78 y.o. female who presents for follow-up of Type 2 diabetes mellitus.   A1C is 7.6% She did bring her blood glucose meter, but she has not been checking every day. Average BG:  148m/dl (only checked 4 times in 30 days) Did have 1 episode of hypoglycemia.  She was unable to function and required assistance.  Her son was able to help her.  She has not missed any doses her medications. She has not missed any meals since her episode of hypoglycemia. Making healthy food choices. Has been walking for exercise.  Walks to get the mail now (was driving).  Her endurance has improved. Denies problems with feet.   Has a cardiology OV on 04/30/13 for PAD assessment Some blurry vision in the morning.  But she does have cataracts.  Last eye exam February 2014.  She goes every 6 months. Nocturia once or twice a night. Does have some polydipsia. Denies peripheral edema.  Has not been to nephrologist recently.  Next OV not until October 2014.  Review of Systems A comprehensive review of systems was negative except for: Eyes: positive for cataracts and blurry vision Cardiovascular: positive for chest pain the cardiologist thought was due to GERD Gastrointestinal: positive for reflux symptoms Genitourinary: positive for nocturia Endocrine: positive for diabetic symptoms including blurry vision and polydipsia    Objective:    BP 126/62  Pulse 66  Temp(Src) 98.2 F (36.8 C) (Oral)  Resp 15  Wt 152 lb 6.4 oz (69.128 kg)  BMI 25.36 kg/m2  General:  alert, cooperative, appears stated age and no distress  Oropharynx: normal findings: lips normal without lesions   Eyes:  negative findings: lids and lashes normal, conjunctivae and sclerae normal and corneas clear   Ears:  external ears normal        Lung: clear to auscultation bilaterally  Heart:  regular rate and rhythm     Extremities: extremities normal, atraumatic, no cyanosis or edema  Skin: dry      Neuro: mental status, speech normal, alert and oriented x3 and gait and station normal   Lab Review Glucose (mg/dL)  Date Value  04/12/2013 79      Glucose, Bld (mg/dL)  Date Value  08/21/2012 131*     CO2 (mmol/L)  Date Value  04/12/2013 24   08/21/2012 25      BUN (mg/dL)  Date Value  04/12/2013 16   08/21/2012 19      Creatinine, Ser (mg/dL)  Date Value  04/12/2013 1.07*  08/21/2012 1.12*     04/12/13 A1C 7.6%  Assessment:    Diabetes Mellitus type II, under good control. A1C has improved. HTN at goal <140/80 Lipids - no recent lipid panel available.  No myalgias with lovastatin CKD stable   Plan:    1.  Rx changes: none 2.  Continue Lantus 44 units daily 3.  Continue Humalog 5 units before meals. 4.  Continue Metformin 10034mtwice daily (SCr 1.07). 5.  Needs to self-monitor blood glucose more often.  Explained importance of improved self-care. 6.  Counseled on recognition and treatment of hypoglycemia. 7.  HTN well controlled on Diovan-HCT and Coreg  8.  CKD stable with SCr 1.07.  Not due to see nephrologist until October 2014 9.  No myalgias with lovastatin.  Will check lipids prior to next OV.

## 2013-06-14 ENCOUNTER — Other Ambulatory Visit: Payer: Medicare Other

## 2013-06-18 ENCOUNTER — Other Ambulatory Visit: Payer: Medicare Other | Admitting: Internal Medicine

## 2013-07-05 ENCOUNTER — Other Ambulatory Visit: Payer: Self-pay | Admitting: Geriatric Medicine

## 2013-07-05 MED ORDER — CARVEDILOL 25 MG PO TABS: 25.0000 mg | ORAL_TABLET | Freq: Two times a day (BID) | ORAL | Status: DC

## 2013-07-05 MED ORDER — METFORMIN HCL 500 MG PO TABS: 1000.0000 mg | ORAL_TABLET | Freq: Two times a day (BID) | ORAL | Status: DC

## 2013-07-05 MED ORDER — VALSARTAN-HYDROCHLOROTHIAZIDE 320-25 MG PO TABS: 1.0000 | ORAL_TABLET | Freq: Every day | ORAL | Status: DC

## 2013-07-30 ENCOUNTER — Other Ambulatory Visit: Payer: Self-pay | Admitting: Geriatric Medicine

## 2013-07-30 MED ORDER — INSULIN GLARGINE 100 UNIT/ML SOLOSTAR PEN: PEN_INJECTOR | SUBCUTANEOUS | Status: DC

## 2013-08-15 ENCOUNTER — Other Ambulatory Visit: Payer: Self-pay | Admitting: Geriatric Medicine

## 2013-08-15 MED ORDER — CLOPIDOGREL BISULFATE 75 MG PO TABS: 75.0000 mg | ORAL_TABLET | Freq: Every day | ORAL | Status: DC

## 2013-08-16 ENCOUNTER — Other Ambulatory Visit: Payer: Medicare Other

## 2013-08-16 DIAGNOSIS — E785 Hyperlipidemia, unspecified: Secondary | ICD-10-CM

## 2013-08-16 DIAGNOSIS — E1129 Type 2 diabetes mellitus with other diabetic kidney complication: Secondary | ICD-10-CM

## 2013-08-17 LAB — COMPREHENSIVE METABOLIC PANEL
ALT: 8 IU/L (ref 0–32)
AST: 12 IU/L (ref 0–40)
Albumin/Globulin Ratio: 1.8 (ref 1.1–2.5)
Albumin: 4 g/dL (ref 3.5–4.8)
Alkaline Phosphatase: 66 IU/L (ref 39–117)
BUN/Creatinine Ratio: 18 (ref 11–26)
BUN: 20 mg/dL (ref 8–27)
CO2: 23 mmol/L (ref 18–29)
Calcium: 10.1 mg/dL (ref 8.6–10.2)
Chloride: 104 mmol/L (ref 97–108)
Creatinine, Ser: 1.1 mg/dL — ABNORMAL HIGH (ref 0.57–1.00)
GFR calc Af Amer: 56 mL/min/{1.73_m2} — ABNORMAL LOW (ref >59)
GFR calc non Af Amer: 49 mL/min/{1.73_m2} — ABNORMAL LOW (ref >59)
Globulin, Total: 2.2 g/dL (ref 1.5–4.5)
Glucose: 99 mg/dL (ref 65–99)
Potassium: 4.5 mmol/L (ref 3.5–5.2)
Sodium: 141 mmol/L (ref 134–144)
Total Bilirubin: 0.2 mg/dL (ref 0.0–1.2)
Total Protein: 6.2 g/dL (ref 6.0–8.5)

## 2013-08-17 LAB — MICROALBUMIN / CREATININE URINE RATIO
Creatinine, Ur: 102.2 mg/dL (ref 15.0–278.0)
MICROALB/CREAT RATIO: 144.4 mg/g creat — ABNORMAL HIGH (ref 0.0–30.0)
Microalbumin, Urine: 147.6 ug/mL — ABNORMAL HIGH (ref 0.0–17.0)

## 2013-08-17 LAB — LIPID PANEL
Chol/HDL Ratio: 2.4 ratio units (ref 0.0–4.4)
Cholesterol, Total: 142 mg/dL (ref 100–199)
HDL: 58 mg/dL (ref >39)
LDL Calculated: 68 mg/dL (ref 0–99)
Triglycerides: 78 mg/dL (ref 0–149)
VLDL Cholesterol Cal: 16 mg/dL (ref 5–40)

## 2013-08-17 LAB — HEMOGLOBIN A1C
Est. average glucose Bld gHb Est-mCnc: 189 mg/dL
Hgb A1c MFr Bld: 8.2 % — ABNORMAL HIGH (ref 4.8–5.6)

## 2013-08-20 ENCOUNTER — Encounter: Payer: Self-pay | Admitting: Pharmacotherapy

## 2013-08-20 ENCOUNTER — Ambulatory Visit (INDEPENDENT_AMBULATORY_CARE_PROVIDER_SITE_OTHER): Payer: Medicare Other | Admitting: Pharmacotherapy

## 2013-08-20 VITALS — BP 118/68 | HR 57 | Temp 98.0°F | Ht 65.0 in | Wt 153.0 lb

## 2013-08-20 DIAGNOSIS — E1129 Type 2 diabetes mellitus with other diabetic kidney complication: Secondary | ICD-10-CM

## 2013-08-20 DIAGNOSIS — I10 Essential (primary) hypertension: Secondary | ICD-10-CM

## 2013-08-20 DIAGNOSIS — N182 Chronic kidney disease, stage 2 (mild): Secondary | ICD-10-CM

## 2013-08-20 MED ORDER — INSULIN LISPRO 100 UNIT/ML (KWIKPEN): 5.0000 [IU] | PEN_INJECTOR | Freq: Three times a day (TID) | SUBCUTANEOUS | Status: DC

## 2013-08-20 NOTE — Progress Notes (Signed)
  Subjective:    Monique Cabrera is a 78 y.o. female who presents for follow-up of Type 2 diabetes mellitus.   She stopped her Humalog for several weeks.  Didn't think she needed it. Recently resumed Humalog at 7 units with meals. She increased her Lantus to 46 units daily. She doesn't check her BG very often.  Says her AM values are ~100, but bedtime >200. Average BG:  151m/dl (only 19 checks in 30 days)  Denies problems with feet. Having some blurry vision.  Eye exam is due in October 2014. Nocturia 1-2 times per night. No peripheral edema.  Not making healthy food choices.  Avoids skipping meals. No routine exercise.  Review of Systems Eyes: positive for visual disturbance Genitourinary:positive for nocturia Endocrine: positive for diabetic symptoms including blurry vision, polyuria and skin dryness    Objective:    BP 118/68  Pulse 57  Temp(Src) 98 F (36.7 C) (Oral)  Ht 5' 5"$  (1.651 m)  Wt 153 lb (69.4 kg)  BMI 25.46 kg/m2  SpO2 99%  General:  alert, cooperative, appears stated age and no distress  Oropharynx: normal findings: lips normal without lesions and gums healthy   Eyes:  negative findings: lids and lashes normal and corneas clear   Ears:  external ears normal        Lung: clear to auscultation bilaterally  Heart:  regular rate and rhythm     Extremities: no edema  Skin: warm and dry, no hyperpigmentation, vitiligo, or suspicious lesions     Neuro: normal without focal findings, mental status, speech normal, alert and oriented x3 and gait and station normal   Lab Review Glucose (mg/dL)  Date Value  08/16/2013 99   04/12/2013 79      Glucose, Bld (mg/dL)  Date Value  08/21/2012 131*     CO2 (mmol/L)  Date Value  08/16/2013 23   04/12/2013 24   08/21/2012 25      BUN (mg/dL)  Date Value  08/16/2013 20   04/12/2013 16   08/21/2012 19      Creatinine, Ser (mg/dL)  Date Value  08/16/2013 1.10*  04/12/2013 1.07*  08/21/2012 1.12*      08/16/13: A1C 8.2% AST:  12 ALT:  8 Total cholesterol:  142 Triglycerides:  78 HDL:  58 LDL  68 Microalbumin:  147.6   Assessment:    Diabetes Mellitus type II, under fair control.  BP at goal <140/80 LDL at goal <100   Plan:    1.  Rx changes: Lantus 46 units daily.  Restart Humalog 5 units with each meals.  Counseled on basal / bolus therapy and why it is important for her to take her Humalog with each meal as prescribed. 2.  Continue Metformin. 3.  Counseled on hypoglycemia.  Explained real v relative hypoglycemia. 4.  Reviewed nutrition goals. 5.  Counseled on benefit of routine exercise.  Goal is 30-45 minutes 5 x week. 6.  Counseled on complications of uncontrolled DM. 7.  HTN at goal <140/80.  Continue current RX. (valsartan/HCTZ, amlodipine, carvedilol) 8.  LDL at goal <100.  Continue lovastatin.  No myalgias noted.

## 2013-08-20 NOTE — Patient Instructions (Signed)
Lantus 46 units daily. Humalog 5 units with EVERY meal. Bring medications to next office visit. Check sugar at least twice daily.

## 2013-09-10 ENCOUNTER — Other Ambulatory Visit: Payer: Self-pay | Admitting: *Deleted

## 2013-09-10 MED ORDER — AMLODIPINE BESYLATE 10 MG PO TABS: ORAL_TABLET | ORAL | Status: DC

## 2013-09-24 ENCOUNTER — Ambulatory Visit (INDEPENDENT_AMBULATORY_CARE_PROVIDER_SITE_OTHER): Payer: Medicare Other | Admitting: Internal Medicine

## 2013-09-24 ENCOUNTER — Encounter: Payer: Self-pay | Admitting: Internal Medicine

## 2013-09-24 VITALS — BP 142/78 | HR 70 | Temp 99.2°F | Wt 153.4 lb

## 2013-09-24 DIAGNOSIS — R0989 Other specified symptoms and signs involving the circulatory and respiratory systems: Secondary | ICD-10-CM

## 2013-09-24 DIAGNOSIS — N058 Unspecified nephritic syndrome with other morphologic changes: Secondary | ICD-10-CM

## 2013-09-24 DIAGNOSIS — E785 Hyperlipidemia, unspecified: Secondary | ICD-10-CM

## 2013-09-24 DIAGNOSIS — I10 Essential (primary) hypertension: Secondary | ICD-10-CM

## 2013-09-24 DIAGNOSIS — IMO0002 Reserved for concepts with insufficient information to code with codable children: Secondary | ICD-10-CM

## 2013-09-24 DIAGNOSIS — E1165 Type 2 diabetes mellitus with hyperglycemia: Secondary | ICD-10-CM

## 2013-09-24 DIAGNOSIS — I739 Peripheral vascular disease, unspecified: Secondary | ICD-10-CM

## 2013-09-24 DIAGNOSIS — E1129 Type 2 diabetes mellitus with other diabetic kidney complication: Secondary | ICD-10-CM

## 2013-09-24 DIAGNOSIS — Z23 Encounter for immunization: Secondary | ICD-10-CM

## 2013-09-24 NOTE — Progress Notes (Signed)
Patient ID: Monique Cabrera, female   DOB: 1935/06/22, 78 y.o.   MRN: [id number] Location:  [facility] / [place]  No Known Allergies  Chief Complaint  Patient presents with  . Medical Managment of Chronic Issues    1 month follow up  . Fatigue    more than usual since August 2014   . Headache    off/on- ongoing concern     HPI: Patient is a 78 y.o. white female seen in the office today for medical management of chronic diseases. Pt. is on Lantus and she increased it 46 from 44 because CBG 433. Saw Cathey on the 25th and she agreed with the increase. Pt. Last sugar was 151 but had been between 119-125. Cathey informed patient to start taking humalog 5units at each meal. Pt. States that since then it has been controlled.   Pt. states that heart MD (at [facility]) prescribed a new medication (Unsure of the name)  But pt. states that he told her a side effect was SOB so patient never took it. Also states that he is getting a CT of bilateral ankles because he thinks there is a blockage. Pt has been having a hard time walking long distance d/t pain in lower legs.  States that GERD is controlled with Prilosec.    Review of Systems:  Review of Systems  Constitutional: Negative for fever, chills and weight loss.  Respiratory: Negative for shortness of breath.   Cardiovascular: Negative for chest pain.  Gastrointestinal: Positive for diarrhea.       Intermittently   Musculoskeletal: Negative for falls.  Neurological: Negative for dizziness.       States that she feels that her equilibrium may be off because she sometimes walks sideways been occuring for 2-3 weeks  Psychiatric/Behavioral: Negative for depression and memory loss.   Past Medical History  Diagnosis Date  . Diabetes mellitus   . Hypertension   . Chest pain, unspecified   . Herpes zoster with other nervous system complications(053.19)   . Nonspecific reaction to tuberculin skin test  without active tuberculosis(795.51)   . Acute upper respiratory infections of unspecified site   . Type II or unspecified type diabetes mellitus with renal manifestations, uncontrolled(250.42)   . Hypertensive renal disease, benign   . Proteinuria   . Type II or unspecified type diabetes mellitus with renal manifestations, not stated as uncontrolled(250.40)   . Anemia, unspecified   . Unspecified disorder of kidney and ureter   . Disorder of bone and cartilage, unspecified   . Pain in joint, lower leg   . Diarrhea   . Hypercalcemia   . Other and unspecified hyperlipidemia   . Unspecified essential hypertension   . Atherosclerosis of native arteries of the extremities, unspecified   . DM (diabetes mellitus) type II controlled with renal manifestation   . Postherpetic neuralgia   . Nonspecific tuberculin test reaction   . Anemia   . Peripheral arterial disease   . Chronic kidney disease (CKD), stage II (mild)     Past Surgical History  Procedure Laterality Date  . Removal of cyst from hand    . Hysterectomy    . Tonsillectomy      Social History:   reports that she has quit smoking. Her smoking use included Cigarettes. She smoked 0.00 packs per day. She does not have any smokeless tobacco history on file. She reports that she does not drink alcohol or use illicit drugs.  Family  History  Problem Relation Age of Onset  . Diabetes Mother   . Diabetes Father   . Diabetes Sister   . Diabetes Sister     Medications: Patient's Medications  New Prescriptions   No medications on file  Previous Medications   AMBULATORY NON FORMULARY MEDICATION    Medication Name: Patient is taking medication for kidneys-? Name of medicine   AMLODIPINE (NORVASC) 10 MG TABLET    Take one tablet by mouth daily for high blood pressure   CARVEDILOL (COREG) 25 MG TABLET    Take 1 tablet (25 mg total) by mouth 2 (two) times daily with a meal.   CLOPIDOGREL (PLAVIX) 75 MG TABLET    Take 1 tablet (75 mg  total) by mouth daily.   ENALAPRIL (VASOTEC) 10 MG TABLET    Take 10 mg by mouth daily.   INSULIN GLARGINE (LANTUS SOLOSTAR) 100 UNIT/ML SOPN    Inject 44 units under the skin daily for diabetes.   INSULIN LISPRO (HUMALOG KWIKPEN) 100 UNIT/ML SOPN    Inject 5 Units into the skin 3 (three) times daily with meals.   LOVASTATIN (MEVACOR) 20 MG TABLET    Take 20 mg by mouth at bedtime.   METFORMIN (GLUCOPHAGE) 500 MG TABLET    Take 2 tablets (1,000 mg total) by mouth 2 (two) times daily with a meal.   NITROSTAT 0.4 MG SL TABLET       OMEPRAZOLE (PRILOSEC) 20 MG CAPSULE       ONE TOUCH ULTRA TEST TEST STRIP       VALSARTAN-HYDROCHLOROTHIAZIDE (DIOVAN-HCT) 320-25 MG PER TABLET    Take 1 tablet by mouth daily.  Modified Medications   No medications on file  Discontinued Medications   No medications on file   Physical Exam: Filed Vitals:   09/24/13 1433  BP: 142/78  Pulse: 70  Temp: 99.2 F (37.3 C)  TempSrc: Oral  Weight: 153 lb 6.4 oz (69.582 kg)  SpO2: 99%   Physical Exam  Constitutional: She is oriented to person, place, and time. She appears well-developed and well-nourished.  Cardiovascular: Normal rate, regular rhythm, normal heart sounds and intact distal pulses.   Right carotid bruit  Pulmonary/Chest: Effort normal and breath sounds normal.  Abdominal: Soft. Bowel sounds are normal.  Neurological: She is alert and oriented to person, place, and time.  Skin: Skin is warm and dry.  Psychiatric: She has a normal mood and affect.   Labs reviewed: Basic Metabolic Panel:  Recent Labs  04/12/13 0901 08/16/13 0909  NA 142 141  K 4.3 4.5  CL 107 104  CO2 24 23  GLUCOSE 79 99  BUN 16 20  CREATININE 1.07* 1.10*  CALCIUM 10.1 10.1   Liver Function Tests:  Recent Labs  08/16/13 0909  AST 12  ALT 8  ALKPHOS 66  BILITOT 0.2  PROT 6.2  CBC:  Recent Labs  04/12/13 0901  WBC 5.8  NEUTROABS 3.0  HGB 11.3  HCT 33.5*  MCV 92   Lipid Panel:  Recent Labs   08/16/13 0909  HDL 58  LDLCALC 68  TRIG 78  CHOLHDL 2.4   Lab Results  Component Value Date   HGBA1C 8.2* 08/16/2013   Assessment/Plan 1. Right carotid bruit - Ambulatory referral to Cardiology--for carotid doppler--pt says she has never had this and has PAD of her lower extremities that is currently being evaluated by Dr. Einar Gip -is having difficulty with equilibrium of uncertain etiology--may be due to her poorly controlled DMII (  is taking her regimen as prescribed, but I do not think she is very adherent to her diet) 2. Type II diabetes mellitus with renal manifestations, uncontrolled -cont current meds, has f/u hba1c ordered already for november -glucose better controlled, she says, but then tells me about how she can tell when it's high???   3. Hypertension -right around goal today, cont current therapy 4. Hyperlipidemia LDL goal < 100 -last lipids at goal with statin therapy 5. Peripheral arterial disease -I believe she had ABIs done at Dr. Irven Shelling, but she is saying she had a CT of her legs???  Labs/tests ordered:  Carotid dopplers, has labs ordered by Tivis Ringer already for her DMII Next appt:  3 mos

## 2013-10-01 ENCOUNTER — Other Ambulatory Visit: Payer: Self-pay | Admitting: Cardiology

## 2013-10-01 DIAGNOSIS — I739 Peripheral vascular disease, unspecified: Secondary | ICD-10-CM

## 2013-10-03 ENCOUNTER — Ambulatory Visit
Admission: RE | Admit: 2013-10-03 | Discharge: 2013-10-03 | Disposition: A | Discharge status: Home / Self Care | Payer: Medicare Other | Source: Ambulatory Visit | Attending: Cardiology | Admitting: Cardiology

## 2013-10-03 DIAGNOSIS — I739 Peripheral vascular disease, unspecified: Secondary | ICD-10-CM

## 2013-10-08 ENCOUNTER — Other Ambulatory Visit: Payer: Self-pay | Admitting: *Deleted

## 2013-10-08 MED ORDER — LOVASTATIN 20 MG PO TABS: ORAL_TABLET | ORAL | Status: DC

## 2013-11-15 ENCOUNTER — Other Ambulatory Visit: Payer: Medicare Other

## 2013-11-16 ENCOUNTER — Other Ambulatory Visit: Payer: Medicare Other

## 2013-11-16 DIAGNOSIS — E1129 Type 2 diabetes mellitus with other diabetic kidney complication: Secondary | ICD-10-CM

## 2013-11-16 DIAGNOSIS — E785 Hyperlipidemia, unspecified: Secondary | ICD-10-CM

## 2013-11-16 DIAGNOSIS — I10 Essential (primary) hypertension: Secondary | ICD-10-CM

## 2013-11-17 LAB — COMPREHENSIVE METABOLIC PANEL
ALT: 7 IU/L (ref 0–32)
AST: 11 IU/L (ref 0–40)
Albumin/Globulin Ratio: 2 (ref 1.1–2.5)
Albumin: 4.1 g/dL (ref 3.5–4.8)
Alkaline Phosphatase: 71 IU/L (ref 39–117)
BUN/Creatinine Ratio: 15 (ref 11–26)
BUN: 15 mg/dL (ref 8–27)
CO2: 25 mmol/L (ref 18–29)
Calcium: 10.3 mg/dL — ABNORMAL HIGH (ref 8.6–10.2)
Chloride: 101 mmol/L (ref 97–108)
Creatinine, Ser: 1.02 mg/dL — ABNORMAL HIGH (ref 0.57–1.00)
GFR calc Af Amer: 62 mL/min/{1.73_m2} (ref >59)
GFR calc non Af Amer: 54 mL/min/{1.73_m2} — ABNORMAL LOW (ref >59)
Globulin, Total: 2.1 g/dL (ref 1.5–4.5)
Glucose: 140 mg/dL — ABNORMAL HIGH (ref 65–99)
Potassium: 3.7 mmol/L (ref 3.5–5.2)
Sodium: 143 mmol/L (ref 134–144)
Total Bilirubin: 0.4 mg/dL (ref 0.0–1.2)
Total Protein: 6.2 g/dL (ref 6.0–8.5)

## 2013-11-17 LAB — LIPID PANEL
Chol/HDL Ratio: 2.9 ratio units (ref 0.0–4.4)
Cholesterol, Total: 176 mg/dL (ref 100–199)
HDL: 60 mg/dL (ref >39)
LDL Calculated: 98 mg/dL (ref 0–99)
Triglycerides: 88 mg/dL (ref 0–149)
VLDL Cholesterol Cal: 18 mg/dL (ref 5–40)

## 2013-11-17 LAB — HEMOGLOBIN A1C
Est. average glucose Bld gHb Est-mCnc: 192 mg/dL
Hgb A1c MFr Bld: 8.3 % — ABNORMAL HIGH (ref 4.8–5.6)

## 2013-11-19 ENCOUNTER — Encounter: Payer: Self-pay | Admitting: *Deleted

## 2013-11-19 ENCOUNTER — Encounter: Payer: Self-pay | Admitting: Pharmacotherapy

## 2013-11-19 ENCOUNTER — Ambulatory Visit (INDEPENDENT_AMBULATORY_CARE_PROVIDER_SITE_OTHER): Payer: Medicare Other | Admitting: Pharmacotherapy

## 2013-11-19 VITALS — BP 150/78 | HR 71 | Temp 97.4°F | Resp 18 | Ht 65.0 in | Wt 152.4 lb

## 2013-11-19 DIAGNOSIS — IMO0002 Reserved for concepts with insufficient information to code with codable children: Secondary | ICD-10-CM

## 2013-11-19 DIAGNOSIS — E785 Hyperlipidemia, unspecified: Secondary | ICD-10-CM

## 2013-11-19 DIAGNOSIS — E1165 Type 2 diabetes mellitus with hyperglycemia: Secondary | ICD-10-CM

## 2013-11-19 DIAGNOSIS — I10 Essential (primary) hypertension: Secondary | ICD-10-CM

## 2013-11-19 DIAGNOSIS — N058 Unspecified nephritic syndrome with other morphologic changes: Secondary | ICD-10-CM

## 2013-11-19 DIAGNOSIS — E1129 Type 2 diabetes mellitus with other diabetic kidney complication: Secondary | ICD-10-CM

## 2013-11-19 MED ORDER — CANAGLIFLOZIN 100 MG PO TABS: 100.0000 mg | ORAL_TABLET | Freq: Every day | ORAL | Status: DC

## 2013-11-19 NOTE — Progress Notes (Signed)
Subjective:    Monique Cabrera is a 78 y.o. female who presents for follow-up of Type 2 diabetes mellitus.   A1C is 8.3%.  No significant change from 3 months ago (A1C 8.2%) She has had another "episode" at work.  Not sure if her BG is high or low.  She feels "off" and breaks into a sweat and feels real hot.  She then gets so she feels sick and her mind is "fuzzy".  She drinks OJ and it helps. She is taking Lantus 44 units every morning. She is taking Humalog 5 units with each meal.  Her breakfast is actually in carb, but high in fat.  She has the "episode" when she skips breakfast. No exercise. She has been having leg pains.  Saw vein specialist.  She has been told she needs stents in her leg veins. Plans on taking a LOA from work after Christmas.  Has a new great-grandson she wants to spend time with.  Denies problems with feet. She is having blurry vision.  Eyesight not as good as it was.  Does have small cataracts. Eye exam is due. Nocturia at least once per night.  Denies polyuria. Denies polyphagia.  Few episodes of hypoglycemia. Fasting BG:  90-140 Not checking during the day. Some polydipsia.  Drinks a lot of water.   Review of Systems A comprehensive review of systems was negative except for: Eyes: positive for cataracts Genitourinary: positive for nocturia Endocrine: positive for diabetic symptoms including blurry vision and polydipsia    Objective:    BP 150/78  Pulse 71  Temp(Src) 97.4 F (36.3 C) (Oral)  Resp 18  Ht 5' 5"$  (1.651 m)  Wt 152 lb 6.4 oz (69.128 kg)  BMI 25.36 kg/m2  SpO2 96%  General:  alert, cooperative and no distress  Oropharynx: normal findings: lips normal without lesions and gums healthy   Eyes:  negative findings: lids and lashes normal and conjunctivae and sclerae normal   Ears:  external ears are normal        Lung: clear to auscultation bilaterally  Heart:  regular rate and rhythm     Extremities: no edema  Skin: dry and  warm     Neuro: mental status, speech normal, alert and oriented x3 and gait and station normal   Lab Review Glucose (mg/dL)  Date Value  11/16/2013 140*  08/16/2013 99   04/12/2013 79      Glucose, Bld (mg/dL)  Date Value  08/21/2012 131*     CO2 (mmol/L)  Date Value  11/16/2013 25   08/16/2013 23   04/12/2013 24      BUN (mg/dL)  Date Value  11/16/2013 15   08/16/2013 20   04/12/2013 16   08/21/2012 19      Creatinine, Ser (mg/dL)  Date Value  11/16/2013 1.02*  08/16/2013 1.10*  04/12/2013 1.07*    11/16/13: A1C:  8.3% AST:  11 ALT:  7 Total cholesterol:  175 Triglycerides:  88 HDL:  60 LDL:  98  Assessment:    Diabetes Mellitus type II, under fair control. A1C basically unchanged. BP above goal <140/80 on 4 agents. LDL at goal <100   Plan:    1.  Rx changes: start Invokana 170m daily in the morning.  Counseled on risk / benefit.  Counseled on importance of good personal hygeine and need for hydration. 2.  Continue Lantus 44 units daily. 3.  Continue Humalog 5 units with meals.  Counseled on why she must  take this only when eating.  If she skips the meal, skip the Humalog. 4.  Counseled on hypoglycemia. 5.  Counseled on nutrition goals.  Needs to stop skipping meals. 6.  Counseled on benefit of routine exercise.  Options are limited due to leg issues.  Goal is 30-45 minutes 5 x week. 7.  HTN above target of <140/80.  Continue valsartan/HCTZ, amlodipine, and carvedilol.  Addition of Invokana may help to lower SBP. 8.  LDL is at goal <100, HDL at goal >40 on lovastatin.   9.  Sees Dr. Mariea Clonts in about 4-6 weeks.  I'll follow up with her in 3 months.  Lab prior:  A1C, CMP, microalbumin.

## 2013-11-19 NOTE — Patient Instructions (Signed)
Start Invokana 100mg  every morning. Only take Humalog when eating. Call if hypoglycemia.

## 2013-12-10 ENCOUNTER — Other Ambulatory Visit: Payer: Self-pay | Admitting: *Deleted

## 2013-12-10 MED ORDER — VALSARTAN-HYDROCHLOROTHIAZIDE 320-25 MG PO TABS: 1.0000 | ORAL_TABLET | Freq: Every day | ORAL | Status: DC

## 2013-12-10 MED ORDER — CLOPIDOGREL BISULFATE 75 MG PO TABS: 75.0000 mg | ORAL_TABLET | Freq: Every day | ORAL | Status: DC

## 2013-12-10 MED ORDER — INSULIN GLARGINE 100 UNIT/ML SOLOSTAR PEN: PEN_INJECTOR | SUBCUTANEOUS | Status: DC

## 2013-12-28 ENCOUNTER — Other Ambulatory Visit: Payer: Self-pay | Admitting: *Deleted

## 2013-12-28 MED ORDER — LOVASTATIN 20 MG PO TABS: ORAL_TABLET | ORAL | Status: DC

## 2013-12-31 ENCOUNTER — Ambulatory Visit: Payer: Medicare Other | Admitting: Internal Medicine

## 2014-01-18 ENCOUNTER — Encounter: Payer: Self-pay | Admitting: *Deleted

## 2014-01-21 ENCOUNTER — Ambulatory Visit: Payer: Medicare Other | Admitting: Internal Medicine

## 2014-02-18 ENCOUNTER — Encounter: Payer: Self-pay | Admitting: Pharmacotherapy

## 2014-02-18 ENCOUNTER — Ambulatory Visit (INDEPENDENT_AMBULATORY_CARE_PROVIDER_SITE_OTHER): Payer: Medicare Other | Admitting: Pharmacotherapy

## 2014-02-18 VITALS — BP 130/80 | HR 73 | Resp 12 | Wt 141.6 lb

## 2014-02-18 DIAGNOSIS — IMO0002 Reserved for concepts with insufficient information to code with codable children: Secondary | ICD-10-CM

## 2014-02-18 DIAGNOSIS — I10 Essential (primary) hypertension: Secondary | ICD-10-CM

## 2014-02-18 DIAGNOSIS — E1165 Type 2 diabetes mellitus with hyperglycemia: Secondary | ICD-10-CM

## 2014-02-18 DIAGNOSIS — E785 Hyperlipidemia, unspecified: Secondary | ICD-10-CM

## 2014-02-18 DIAGNOSIS — E1129 Type 2 diabetes mellitus with other diabetic kidney complication: Secondary | ICD-10-CM

## 2014-02-18 NOTE — Patient Instructions (Signed)
Restart Invokana 100mg  daily.  Pick up at drug store. Continue insulin and metformin.

## 2014-02-18 NOTE — Progress Notes (Signed)
  Subjective:    Monique Cabrera is a 79 y.o.African American female who presents for follow-up of Type 2 diabetes mellitus.   She stopped taking Invokana when her samples ran out - did not understand she needed to pick up RX at the pharmacy.  Her BG was better when she was taking her Invokana, felt better. Fasting today. Her last A1C was >8%  She is not self-monitoring her blood glucose. Denies hypoglycemia. Has forgotten to take some doses of her medications.  She has been under stress.  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  Her great-grandchild was born premature with health issues.  She is now taking care of the baby. She had to move apartments.  She is on Lantus 46 units daily. She does take Humalog 5 units with breakfast and supper.  She often skips lunch. Continues the metformin.  Denies problems with feet. Has noticed some difficulty telling dark colors apart. Nocturia at least twice per night. No peripheral edema.   Review of Systems A comprehensive review of systems was negative except for: Genitourinary: positive for nocturia Behavioral/Psych: positive for stress    Objective:    BP 130/80  Pulse 73  Resp 12  Wt 141 lb 9.6 oz (64.229 kg)  SpO2 99%  General:  alert, cooperative, appears stated age and no distress      Eyes:  negative findings: lids and lashes normal and conjunctivae and sclerae normal   Ears:  external ears normal        Lung: clear to auscultation bilaterally  Heart:  regular rate and rhythm     Extremities: No edema noted  Skin: warm and dry, no hyperpigmentation, vitiligo, or suspicious lesions     Neuro: mental status, speech normal, alert and oriented x3 and gait and station normal   Lab Review Glucose (mg/dL)  Date Value  11/16/2013 140*  08/16/2013 99   04/12/2013 79      Glucose, Bld (mg/dL)  Date Value  08/21/2012 131*     CO2 (mmol/L)  Date Value   11/16/2013 25   08/16/2013 23   04/12/2013 24      BUN (mg/dL)  Date Value  11/16/2013 15   08/16/2013 20   04/12/2013 16   08/21/2012 19      Creatinine, Ser (mg/dL)  Date Value  11/16/2013 1.02*  08/16/2013 1.10*  04/12/2013 1.07*       Assessment:    Diabetes Mellitus type II, under fair control.  BP at goal <140/90   Plan:    1.  Rx changes: restart Invokana 11m daily. 2.  Continue Lantus 44 units daily. 3.  Continue Humalog 5 units with meals. 4.  Continue Metformin 10027mtwice daily (SCr 1.02). 5.  Labs today:  A1C, CMP, microalbumin. 6.  Counseled on nutrition goals.  Needs to stop skipping meals. 7.  Counseled on stress mgmt.  She will consider talking to her pastor for counseling. 8.  HTN at goal <140/90.

## 2014-02-19 LAB — COMPREHENSIVE METABOLIC PANEL
ALT: 9 IU/L (ref 0–32)
AST: 13 IU/L (ref 0–40)
Albumin/Globulin Ratio: 1.8 (ref 1.1–2.5)
Albumin: 4 g/dL (ref 3.5–4.8)
Alkaline Phosphatase: 72 IU/L (ref 39–117)
BUN/Creatinine Ratio: 12 (ref 11–26)
BUN: 12 mg/dL (ref 8–27)
CO2: 20 mmol/L (ref 18–29)
Calcium: 10.2 mg/dL (ref 8.7–10.3)
Chloride: 101 mmol/L (ref 97–108)
Creatinine, Ser: 1.04 mg/dL — ABNORMAL HIGH (ref 0.57–1.00)
GFR calc Af Amer: 60 mL/min/{1.73_m2} (ref >59)
GFR calc non Af Amer: 52 mL/min/{1.73_m2} — ABNORMAL LOW (ref >59)
Globulin, Total: 2.2 g/dL (ref 1.5–4.5)
Glucose: 256 mg/dL — ABNORMAL HIGH (ref 65–99)
Potassium: 4.1 mmol/L (ref 3.5–5.2)
Sodium: 137 mmol/L (ref 134–144)
Total Bilirubin: 0.4 mg/dL (ref 0.0–1.2)
Total Protein: 6.2 g/dL (ref 6.0–8.5)

## 2014-02-19 LAB — MICROALBUMIN / CREATININE URINE RATIO
Creatinine, Ur: 72.8 mg/dL (ref 15.0–278.0)
MICROALB/CREAT RATIO: 1183.5 mg/g creat — ABNORMAL HIGH (ref 0.0–30.0)
Microalbumin, Urine: 861.6 ug/mL — ABNORMAL HIGH (ref 0.0–17.0)

## 2014-02-19 LAB — HEMOGLOBIN A1C
Est. average glucose Bld gHb Est-mCnc: 232 mg/dL
Hgb A1c MFr Bld: 9.7 % — ABNORMAL HIGH (ref 4.8–5.6)

## 2014-02-23 ENCOUNTER — Other Ambulatory Visit: Payer: Self-pay | Admitting: Internal Medicine

## 2014-03-08 ENCOUNTER — Encounter: Payer: Self-pay | Admitting: *Deleted

## 2014-03-11 ENCOUNTER — Ambulatory Visit (INDEPENDENT_AMBULATORY_CARE_PROVIDER_SITE_OTHER): Payer: Medicare Other | Admitting: Internal Medicine

## 2014-03-11 ENCOUNTER — Telehealth: Payer: Self-pay | Admitting: *Deleted

## 2014-03-11 ENCOUNTER — Encounter: Payer: Self-pay | Admitting: Internal Medicine

## 2014-03-11 VITALS — BP 110/64 | HR 68 | Temp 98.2°F | Wt 142.0 lb

## 2014-03-11 DIAGNOSIS — E785 Hyperlipidemia, unspecified: Secondary | ICD-10-CM

## 2014-03-11 DIAGNOSIS — E13319 Other specified diabetes mellitus with unspecified diabetic retinopathy without macular edema: Secondary | ICD-10-CM

## 2014-03-11 DIAGNOSIS — IMO0002 Reserved for concepts with insufficient information to code with codable children: Secondary | ICD-10-CM

## 2014-03-11 DIAGNOSIS — I70219 Atherosclerosis of native arteries of extremities with intermittent claudication, unspecified extremity: Secondary | ICD-10-CM | POA: Insufficient documentation

## 2014-03-11 DIAGNOSIS — E1139 Type 2 diabetes mellitus with other diabetic ophthalmic complication: Secondary | ICD-10-CM

## 2014-03-11 DIAGNOSIS — E1129 Type 2 diabetes mellitus with other diabetic kidney complication: Secondary | ICD-10-CM | POA: Insufficient documentation

## 2014-03-11 DIAGNOSIS — E1339 Other specified diabetes mellitus with other diabetic ophthalmic complication: Secondary | ICD-10-CM

## 2014-03-11 DIAGNOSIS — E11319 Type 2 diabetes mellitus with unspecified diabetic retinopathy without macular edema: Secondary | ICD-10-CM

## 2014-03-11 DIAGNOSIS — E1165 Type 2 diabetes mellitus with hyperglycemia: Secondary | ICD-10-CM

## 2014-03-11 DIAGNOSIS — H35049 Retinal micro-aneurysms, unspecified, unspecified eye: Secondary | ICD-10-CM

## 2014-03-11 DIAGNOSIS — B1920 Unspecified viral hepatitis C without hepatic coma: Secondary | ICD-10-CM

## 2014-03-11 DIAGNOSIS — B182 Chronic viral hepatitis C: Secondary | ICD-10-CM | POA: Insufficient documentation

## 2014-03-11 DIAGNOSIS — Z23 Encounter for immunization: Secondary | ICD-10-CM

## 2014-03-11 MED ORDER — TETANUS-DIPHTH-ACELL PERTUSSIS 5-2.5-18.5 LF-MCG/0.5 IM SUSP: 0.5000 mL | Freq: Once | INTRAMUSCULAR | Status: DC

## 2014-03-11 NOTE — Progress Notes (Signed)
Patient ID: Monique Cabrera, female   DOB: 09-10-1935, 79 y.o.   MRN: [id number]   Location:  [facility]  Code Status: DNR  No Known Allergies  Chief Complaint  Patient presents with  . Medical Managment of Chronic Issues    3 month f/u   . other    ? unable to give blood due to a trace of Hep C, wants to ask questions    HPI: Patient is a 79 y.o. black female seen in the office today for medical mgt of chronic diseases.  Was giving blood about 24 years ago.  Was told she couldn't give blood anymore b/c after they took it the last time they found hep c in it.  Has never had any symptoms.  Now sees commercial about it and she wonders if she has it.    Continues to skip meals sometimes.  [place] working.  Eats breakfast.  Watches grandbaby.  Snacks frequently.  Eats chips and other starchy food she shouldn't.  Is on invokana, lantus 44 units, humalog 5 with meals and metformin 1000 mg po bid.    Is due to go to eye doctor.  Tivis Ringer notes difficulty with distinguishing colors.    Does not want to take the pletal with plavix as recommended by Dr. Einar Gip for her possible high grade stenosis of the distal sfa bilaterally and also refuses angiogram.  I explained that my concern and Dr. Irven Shelling is that her circulation is so poor, she could end up losing a foot or leg.  She says her pain is not bad enough to get anything done about this.    Worked at hospital for 15 Years.    Seems to be on both lisinopril and valsartan/hctz.  She says Dr. Florene Glen started the lisinopril--I wonder if he knew she was also on valsartan/hctz.    Review of Systems:  Review of Systems  Constitutional: Negative for fever, chills, weight loss and malaise/fatigue.  HENT: Negative for congestion and hearing loss.   Eyes: Negative for blurred vision.       Some increased difficulty seeing colors, wears glasses, see hpi  Cardiovascular: Positive for claudication. Negative  for chest pain, palpitations and leg swelling.  Gastrointestinal: Negative for abdominal pain, constipation, blood in stool and melena.  Genitourinary: Negative for dysuria.  Musculoskeletal: Positive for myalgias. Negative for back pain, falls and joint pain.  Skin: Negative for rash.  Neurological: Positive for sensory change. Negative for dizziness, tingling, focal weakness, loss of consciousness, weakness and headaches.  Endo/Heme/Allergies: Does not bruise/bleed easily.  Psychiatric/Behavioral: Negative for depression and memory loss. The patient is not nervous/anxious and does not have insomnia.     Past Medical History  Diagnosis Date  . Diabetes mellitus   . Hypertension   . Chest pain, unspecified   . Herpes zoster with other nervous system complications(053.19)   . Nonspecific reaction to tuberculin skin test without active tuberculosis(795.51)   . Acute upper respiratory infections of unspecified site   . Type II or unspecified type diabetes mellitus with renal manifestations, uncontrolled   . Hypertensive renal disease, benign   . Proteinuria   . Type II or unspecified type diabetes mellitus with renal manifestations, not stated as uncontrolled   . Anemia, unspecified   . Unspecified disorder of kidney and ureter   . Disorder of bone and cartilage, unspecified   . Pain in joint, lower leg   . Diarrhea   .  Hypercalcemia   . Other and unspecified hyperlipidemia   . Unspecified essential hypertension   . Atherosclerosis of native arteries of the extremities, unspecified   . DM (diabetes mellitus) type II controlled with renal manifestation   . Postherpetic neuralgia   . Nonspecific tuberculin test reaction   . Anemia   . Peripheral arterial disease   . Chronic kidney disease (CKD), stage II (mild)     Past Surgical History  Procedure Laterality Date  . Removal of cyst from hand    . Hysterectomy    . Tonsillectomy    . Removal of tumor from foot      Social  History:   reports that she has quit smoking. Her smoking use included Cigarettes. She smoked 0.00 packs per day. She does not have any smokeless tobacco history on file. She reports that she does not drink alcohol or use illicit drugs.  Family History  Problem Relation Age of Onset  . Diabetes Mother   . Diabetes Father   . Diabetes Sister   . Diabetes Sister     Medications: Patient's Medications  New Prescriptions   No medications on file  Previous Medications   AMLODIPINE (NORVASC) 10 MG TABLET    Take one tablet by mouth daily for high blood pressure   B-D ULTRAFINE III SHORT PEN 31G X 8 MM MISC    USE WITH LANTUS EVERY DAY FOR BLOOD SUGAR   CANAGLIFLOZIN (INVOKANA) 100 MG TABS    Take 1 tablet (100 mg total) by mouth daily.   CARVEDILOL (COREG) 25 MG TABLET    Take 1 tablet (25 mg total) by mouth 2 (two) times daily with a meal.   CILOSTAZOL (PLETAL) 100 MG TABLET    Take 100 mg by mouth daily.   CLOPIDOGREL (PLAVIX) 75 MG TABLET    Take 1 tablet (75 mg total) by mouth daily.   ENALAPRIL (VASOTEC) 10 MG TABLET    Take 10 mg by mouth daily.   INSULIN GLARGINE (LANTUS SOLOSTAR) 100 UNIT/ML SOPN    Inject 44 units under the skin daily for diabetes.   INSULIN LISPRO (HUMALOG KWIKPEN) 100 UNIT/ML SOPN    Inject 5 Units into the skin 3 (three) times daily with meals.   LOVASTATIN (MEVACOR) 20 MG TABLET    Take one tablet by mouth every day for cholesterol   METFORMIN (GLUCOPHAGE) 500 MG TABLET    Take 2 tablets (1,000 mg total) by mouth 2 (two) times daily with a meal.   NITROSTAT 0.4 MG SL TABLET       OMEPRAZOLE (PRILOSEC) 20 MG CAPSULE       ONE TOUCH ULTRA TEST TEST STRIP       TDAP (BOOSTRIX) 5-2.5-18.5 LF-MCG/0.5 INJECTION    Inject 0.5 mLs into the muscle once.   VALSARTAN-HYDROCHLOROTHIAZIDE (DIOVAN-HCT) 320-25 MG PER TABLET    Take 1 tablet by mouth daily.  Modified Medications   No medications on file  Discontinued Medications   AMBULATORY NON FORMULARY MEDICATION     Medication Name: Patient is taking medication for kidneys-? Name of medicine     Physical Exam: Filed Vitals:   03/11/14 1537  BP: 110/64  Pulse: 68  Temp: 98.2 F (36.8 C)  TempSrc: Oral  Weight: 142 lb (64.411 kg)  SpO2: 98%  Physical Exam  Constitutional: She is oriented to person, place, and time. No distress.  Chronically ill appearing black female  HENT:  Head: Normocephalic and atraumatic.  Eyes: EOM are normal. Pupils  are equal, round, and reactive to light.  Wears glasses  Neck: Neck supple. No JVD present.  Cardiovascular: Normal rate, regular rhythm, normal heart sounds and intact distal pulses.   Pulmonary/Chest: Effort normal and breath sounds normal. No respiratory distress. She has no rales.  Abdominal: Soft. Bowel sounds are normal. She exhibits no distension and no mass. There is no tenderness.  Musculoskeletal: Normal range of motion. She exhibits no edema and no tenderness.  Neurological: She is alert and oriented to person, place, and time.  Skin: Skin is warm and dry.  Psychiatric: She has a normal mood and affect.    Labs reviewed: Basic Metabolic Panel:  Recent Labs  08/16/13 0909 11/16/13 1018 02/18/14 0927  NA 141 143 137  K 4.5 3.7 4.1  CL 104 101 101  CO2 23 25 20   GLUCOSE 99 140* 256*  BUN 20 15 12   CREATININE 1.10* 1.02* 1.04*  CALCIUM 10.1 10.3* 10.2   Liver Function Tests:  Recent Labs  08/16/13 0909 11/16/13 1018 02/18/14 0927  AST 12 11 13   ALT 8 7 9   ALKPHOS 66 71 72  BILITOT 0.2 0.4 0.4  PROT 6.2 6.2 6.2   CBC:  Recent Labs  04/12/13 0901  WBC 5.8  NEUTROABS 3.0  HGB 11.3  HCT 33.5*  MCV 92   Lipid Panel:  Recent Labs  08/16/13 0909 11/16/13 1018  HDL 58 60  LDLCALC 68 98  TRIG 78 88  CHOLHDL 2.4 2.9   Lab Results  Component Value Date   HGBA1C 9.7* 02/18/2014   Assessment/Plan 1. Atherosclerosis of native arteries of the extremities with intermittent claudication -she has had ABIs done at Dr.  Irven Shelling office--his assessment was that she needed further studies but she refused angiogram -she also refuses to take pletal along with her plavix as he recommended--says she doesn't want to take more meds and she read that the side effects including death outweighed the benefits -fortunately, she no longer smokes -her walking is significantly limited due to her claudication, but the pain does resolve quickly with rest -I discussed in detail with her that her "possible high grade stenosis of her distal superficial femoral artery bilaterally" could eventually cause complete obstruction and limb loss -I recommended she have the angiogram for further assessment, but she refuses -I explained the symptoms of acute limb ischemia to her and explained that that would be an emergency at that point, and her leg may not be salvageable in that situation--she continued to refuse intervention saying that when she has pain at rest, she will get further testing done and possible intervention  2. Diabetic retinopathy -with h/o hemorrhages behind the eye requiring laser treatments--I don't have the records from this, but she previously saw someone at a facility on [address] about this--I emphasized that she needs an ophthalmologist at this point (see hpi for more details) - Ambulatory referral to Ophthalmology  3. 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 Type II diabetes mellitus with renal manifestations, uncontrolled -poorly controlled--she is not adherent with checking her glucose and does not follow a strict diabetic diet (talked about eating potato chips today) -she continues to follow with Tivis Ringer who is counseling her regularly -will check diabetic foot exam next visit -referral to ophtho done today -cont invokana, lantus, novolog and metformin  5. Hepatitis C -apparently was  told 24 years ago that she  was positive for hep c--? Whether she cleared this infection or developed chronic hep C -need to check Hepatitis Acute Panel to assess -fortunately, transaminases are normal -she is not sexually active--has not been since her husband passed away  6. Hyperlipidemia LDL goal < 100 -cont mevacor -last lipids at goal of <100 in November, has good HDL so will not push to less than 70  7. Need for prophylactic vaccination with combined diphtheria-tetanus-pertussis (DTP) vaccine -script given to take to [facility] for her boostrix: - Tdap ([place]) 5-2.5-18.5 LF-MCG/0.5 injection; Inject 0.5 mLs into the muscle once.  Dispense: 0.5 mL; Refill: 0  I asked our staff to check with Dr. Abel Presto office [facility]) to find out if Dr. Florene Glen wanted her to to be on both lisinopril and valsartan/hctz.  She has been on the valsartan/hctz long term and she tells me lisinopril was recently added.  I have his note from Sept 2014, but it does not mention any med changes at that time.   Labs/tests ordered: Orders Placed This Encounter  Procedures  . Hepatitis Acute Panel  . Ambulatory referral to Ophthalmology    Referral Priority:  Routine    Referral Type:  Consultation    Referral Reason:  Specialty Services Required    Requested Specialty:  Ophthalmology    Number of Visits Requested:  1    Next appt:  3 mos

## 2014-03-11 NOTE — Telephone Encounter (Signed)
She is taking Lisinopril and Valsartan/HCTZ.  Per Dr Mariea Clonts pt shouldn't be on both.  Left message to rtn call @ Dr Abel Presto office [phone]), with Pricilla Holm.

## 2014-03-12 LAB — HEPATITIS PANEL, ACUTE
Hep A IgM: NEGATIVE
Hep B C IgM: NEGATIVE
Hep C Virus Ab: <0.1 s/co ratio (ref 0.0–0.9)
Hepatitis B Surface Ag: NEGATIVE

## 2014-03-13 NOTE — Telephone Encounter (Signed)
Per [facility] @ Dr Abel Presto office, pt is not taking Lisinipril and

## 2014-03-13 NOTE — Telephone Encounter (Signed)
lft message again for a rtn call.

## 2014-03-21 NOTE — Telephone Encounter (Signed)
Good, please discontinue lisinopril in her med list.

## 2014-03-25 ENCOUNTER — Other Ambulatory Visit: Payer: Self-pay | Admitting: Internal Medicine

## 2014-03-25 NOTE — Telephone Encounter (Signed)
Medication removed already.

## 2014-03-26 LAB — SPECIMEN STATUS REPORT

## 2014-04-03 LAB — HCV RNA BY PCR, QN RFX GENO: HCV Quant Baseline: NOT DETECTED IU/mL

## 2014-04-03 LAB — SPECIMEN STATUS REPORT

## 2014-05-09 ENCOUNTER — Other Ambulatory Visit: Payer: Medicare Other

## 2014-05-09 DIAGNOSIS — E1165 Type 2 diabetes mellitus with hyperglycemia: Principal | ICD-10-CM

## 2014-05-09 DIAGNOSIS — E1129 Type 2 diabetes mellitus with other diabetic kidney complication: Secondary | ICD-10-CM

## 2014-05-09 DIAGNOSIS — IMO0002 Reserved for concepts with insufficient information to code with codable children: Secondary | ICD-10-CM

## 2014-05-09 DIAGNOSIS — E785 Hyperlipidemia, unspecified: Secondary | ICD-10-CM

## 2014-05-10 LAB — COMPREHENSIVE METABOLIC PANEL
ALT: 9 IU/L (ref 0–32)
AST: 15 IU/L (ref 0–40)
Albumin/Globulin Ratio: 1.9 (ref 1.1–2.5)
Albumin: 4.3 g/dL (ref 3.5–4.8)
Alkaline Phosphatase: 70 IU/L (ref 39–117)
BUN/Creatinine Ratio: 17 (ref 11–26)
BUN: 22 mg/dL (ref 8–27)
CO2: 24 mmol/L (ref 18–29)
Calcium: 10.1 mg/dL (ref 8.7–10.3)
Chloride: 100 mmol/L (ref 97–108)
Creatinine, Ser: 1.28 mg/dL — ABNORMAL HIGH (ref 0.57–1.00)
GFR calc Af Amer: 47 mL/min/{1.73_m2} — ABNORMAL LOW (ref >59)
GFR calc non Af Amer: 41 mL/min/{1.73_m2} — ABNORMAL LOW (ref >59)
Globulin, Total: 2.3 g/dL (ref 1.5–4.5)
Glucose: 142 mg/dL — ABNORMAL HIGH (ref 65–99)
Potassium: 4.1 mmol/L (ref 3.5–5.2)
Sodium: 138 mmol/L (ref 134–144)
Total Bilirubin: 0.2 mg/dL (ref 0.0–1.2)
Total Protein: 6.6 g/dL (ref 6.0–8.5)

## 2014-05-10 LAB — HEMOGLOBIN A1C
Est. average glucose Bld gHb Est-mCnc: 206 mg/dL
Hgb A1c MFr Bld: 8.8 % — ABNORMAL HIGH (ref 4.8–5.6)

## 2014-05-10 LAB — LIPID PANEL
Chol/HDL Ratio: 3 ratio units (ref 0.0–4.4)
Cholesterol, Total: 182 mg/dL (ref 100–199)
HDL: 61 mg/dL (ref >39)
LDL Calculated: 103 mg/dL — ABNORMAL HIGH (ref 0–99)
Triglycerides: 90 mg/dL (ref 0–149)
VLDL Cholesterol Cal: 18 mg/dL (ref 5–40)

## 2014-05-13 ENCOUNTER — Encounter: Payer: Self-pay | Admitting: Pharmacotherapy

## 2014-05-13 ENCOUNTER — Ambulatory Visit (INDEPENDENT_AMBULATORY_CARE_PROVIDER_SITE_OTHER): Payer: Medicare Other | Admitting: Pharmacotherapy

## 2014-05-13 VITALS — BP 128/86 | HR 70 | Temp 98.1°F | Wt 141.0 lb

## 2014-05-13 DIAGNOSIS — I10 Essential (primary) hypertension: Secondary | ICD-10-CM

## 2014-05-13 DIAGNOSIS — E785 Hyperlipidemia, unspecified: Secondary | ICD-10-CM

## 2014-05-13 DIAGNOSIS — IMO0002 Reserved for concepts with insufficient information to code with codable children: Secondary | ICD-10-CM

## 2014-05-13 DIAGNOSIS — E1165 Type 2 diabetes mellitus with hyperglycemia: Secondary | ICD-10-CM

## 2014-05-13 DIAGNOSIS — N182 Chronic kidney disease, stage 2 (mild): Secondary | ICD-10-CM

## 2014-05-13 DIAGNOSIS — E1129 Type 2 diabetes mellitus with other diabetic kidney complication: Secondary | ICD-10-CM

## 2014-05-13 NOTE — Patient Instructions (Signed)
A1C is much better.  New goal <8%. Need to check blood sugar twice a day. Take Humalog with meals.

## 2014-05-13 NOTE — Progress Notes (Signed)
  Subjective:    Monique Cabrera is a 79 y.o.African Amercian female who presents for follow-up of Type 2 diabetes mellitus.   A1C a little better. Did not bring BGM.  Not checking BG every day.  She only checks when she feels dizzy or has a HA. Denies hypoglycemia. Has some positional vertigo. Not staying well hydrated. Continues to skip meals. Doing a little exercise. No edema. Denies problems with feet. Her vision is poor - some vision loss, blurry vision. No UTI or vaginal yeast. Nocturia x 2 each night.  Complains of a scratchy throat / dry cough.  Post-nasal drip.  Lantus 46 units Skipping doses of Humalog - should be taking 5 units with each meal.   Review of Systems A comprehensive review of systems was negative except for: Eyes: positive for vision loss Ears, nose, mouth, throat, and face: positive for sore throat and post nasal drip Genitourinary: positive for nocturia Endocrine: positive for diabetic symptoms including blurry vision and polyuria    Objective:    BP 128/86  Pulse 70  Temp(Src) 98.1 F (36.7 C) (Oral)  Wt 141 lb (63.957 kg)  SpO2 95%  General:  alert, cooperative and no distress  Oropharynx: normal findings: lips normal without lesions and gums healthy   Eyes:  negative findings: lids and lashes normal and conjunctivae and sclerae normal   Ears:  external ears normal        Lung: clear to auscultation bilaterally  Heart:  regular rate and rhythm     Extremities: no edema  Skin: warm and dry, no hyperpigmentation, vitiligo, or suspicious lesions     Neuro: mental status, speech normal, alert and oriented x3 and gait and station normal   Lab Review Glucose (mg/dL)  Date Value  05/09/2014 142*  02/18/2014 256*  11/16/2013 140*     Glucose, Bld (mg/dL)  Date Value  08/21/2012 131*     CO2 (mmol/L)  Date Value  05/09/2014 24   02/18/2014 20   11/16/2013 25      BUN (mg/dL)  Date Value  05/09/2014 22   02/18/2014 12   11/16/2013  15   08/21/2012 19      Creatinine, Ser (mg/dL)  Date Value  05/09/2014 1.28*  02/18/2014 1.04*  11/16/2013 1.02*    05/09/14: A1C:  8.8% AST:  15 ALT:  9 Total cholesterol:  182 Triglycerides:  90 HDL:  61 LDL:  103 Estimated CrCl:  47 ml/min   Assessment:    Diabetes Mellitus type II, under fair control.  BP at goal <140/90 LDL close to goal <100   Plan:    1.  Rx changes: Needs to take Humalog with meals - stop skipping doses. 2.  Continue Lantus 46 units daily. 3.  Continue Invokana 100mg  daily. 4.  Continue metformin. 5.  Counseled on nutrition goals. 6.  Counseled on exercise goals. 7.  HTN at goal <140/90 8.  Lipids close to goal of LDL <100

## 2014-05-27 ENCOUNTER — Encounter (HOSPITAL_COMMUNITY): Payer: Self-pay | Admitting: Emergency Medicine

## 2014-05-27 ENCOUNTER — Emergency Department (INDEPENDENT_AMBULATORY_CARE_PROVIDER_SITE_OTHER)
Admission: EM | Admit: 2014-05-27 | Discharge: 2014-05-27 | Disposition: A | Discharge status: Home / Self Care | Payer: Medicare Other | Source: Home / Self Care | Attending: Emergency Medicine | Admitting: Emergency Medicine

## 2014-05-27 DIAGNOSIS — L089 Local infection of the skin and subcutaneous tissue, unspecified: Secondary | ICD-10-CM

## 2014-05-27 DIAGNOSIS — L723 Sebaceous cyst: Secondary | ICD-10-CM

## 2014-05-27 HISTORY — PX: INCISION AND DRAINAGE: SHX5863

## 2014-05-27 MED ORDER — DOXYCYCLINE HYCLATE 100 MG PO CAPS: 100.0000 mg | ORAL_CAPSULE | Freq: Two times a day (BID) | ORAL | Status: DC

## 2014-05-27 NOTE — Discharge Instructions (Signed)

## 2014-05-27 NOTE — ED Provider Notes (Signed)
  Chief Complaint   Chief Complaint  Patient presents with  . Skin Problem    History of Present Illness   Monique Cabrera is a 79 year old female who has had a four-day history of painful swelling of the helix of the left ear just above the tragus. This is sore to touch. It has not drained any pus. She denies any fever or chills. She's had no other skin lesions, no history of skin infections, MRSA, abscesses, or boils.  Review of Systems   Other than as noted above, the patient denies any of the following symptoms: Systemic:  No fever, chills or sweats. Skin:  No rash or itching.  [place]   Past medical history, family history, social history, meds, and allergies were reviewed. She has diabetes and hypertension. Current meds include amlodipine, Invokana, carvedilol, Plavix, enalapril, Lantus, Humalog, Mevacor, Glucophage, and Diovan/HCTZ.  Physical Examination     Vital signs:  BP 161/67  Pulse 70  Temp(Src) 98.3 F (36.8 C) (Oral)  Resp 16  SpO2 96% Skin:  On the anterior aspect of the helix of the left ear right above the tragus there was swelling, erythema, and tenderness to palpation, the ear canal and TM were normal.  There was no crepitus, necrosis, ecchymosis, or herrhagic bullae. Skin exam was otherwise normal.  No rash. Ext:  Distal pulses were full, patient has full ROM of all joints.  Procedure Note:  Verbal informed consent was obtained.  The patient was informed of the risks and benefits of the procedure and understands and accepts.  A time out was called and the identity of the patient and correct procedure was confirmed.   The abscess area described above was prepped with Betadine and alcohol and anesthetized with 1 mL of 2% Xylocaine without epinephrine.  Using a #11 scalpel blade, a singe straight incision was made into the area of fluctulence, yielding a small amount of prurulent drainage the abscess cavity was drained with gentle pressure, yielding a small cyst.   Routine cultures were obtained.  Bleeding was stopped with firm pressure.  A sterile pressure dressing was applied.   Assessment   The encounter diagnosis was Infected sebaceous cyst.  Plan     1.  Meds:  The following meds were prescribed:   Discharge Medication List as of 05/27/2014  1:56 PM    START taking these medications   Details  doxycycline (VIBRAMYCIN) 100 MG capsule Take 1 capsule (100 mg total) by mouth 2 (two) times daily., Starting 05/27/2014, Until Discontinued, Normal        2.  Patient Education/Counseling:  The patient was given appropriate handouts, self care instructions, and instructed in symptomatic relief.  Patient was instructed in wound care.  3.  Follow up:  The patient was instructed to return if he should become worse in any way.       Harden Mo, MD 05/27/14 [phone]

## 2014-05-27 NOTE — ED Notes (Signed)
States she woke w sore place on her left ear

## 2014-05-30 LAB — CULTURE, ROUTINE-ABSCESS: Gram Stain: NONE SEEN

## 2014-06-17 ENCOUNTER — Ambulatory Visit (INDEPENDENT_AMBULATORY_CARE_PROVIDER_SITE_OTHER): Payer: Medicare Other | Admitting: Internal Medicine

## 2014-06-17 ENCOUNTER — Encounter: Payer: Self-pay | Admitting: Internal Medicine

## 2014-06-17 VITALS — BP 118/72 | HR 72 | Temp 98.1°F | Wt 143.0 lb

## 2014-06-17 DIAGNOSIS — Z9119 Patient's noncompliance with other medical treatment and regimen: Secondary | ICD-10-CM

## 2014-06-17 DIAGNOSIS — E1165 Type 2 diabetes mellitus with hyperglycemia: Secondary | ICD-10-CM

## 2014-06-17 DIAGNOSIS — E1129 Type 2 diabetes mellitus with other diabetic kidney complication: Secondary | ICD-10-CM

## 2014-06-17 DIAGNOSIS — IMO0002 Reserved for concepts with insufficient information to code with codable children: Secondary | ICD-10-CM

## 2014-06-17 DIAGNOSIS — I70219 Atherosclerosis of native arteries of extremities with intermittent claudication, unspecified extremity: Secondary | ICD-10-CM

## 2014-06-17 DIAGNOSIS — Z91199 Patient's noncompliance with other medical treatment and regimen due to unspecified reason: Secondary | ICD-10-CM

## 2014-06-17 DIAGNOSIS — N182 Chronic kidney disease, stage 2 (mild): Secondary | ICD-10-CM

## 2014-06-17 NOTE — Progress Notes (Signed)
Patient ID: Monique Cabrera, female   DOB: 1935/10/16, 79 y.o.   MRN: [id number]   Location:  [facility]  Code Status: full code  No Known Allergies  Chief Complaint  Patient presents with  . Medical Management of Chronic Issues    blood sugar, blood pressure. Went to [facility] 05/27/14 for sebacous cyst left ear, they did I&D    HPI: Patient is a 79 y.o. black female seen in the office today for med mgt chronic diseases.    Went to urgent care--had cyst on top of left ear--went and had it lanced and put on abx.  Has resolved and no further problems.  Says she has mood swings, but talks with God about her problems, also realizes that a lot of people are worse off than she is.   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  He wears her out.  Gets irritated if she has him for a while.   Doing a little bit more walking b/c takes baby out in stroller.  Improvement from the nothing she was doing.   Saw Cathey and reviewed Cathey's note hba1c slowly improving. Doesn't like checking her sugar.  Does well for a month, then slacks off.  Cholesterol going up.  Will check her sugar if she has a high one.   Has no pain whatsoever. Had discussion about advanced care planning.   Is entirely independent in all ADLs, IADLs.    Review of Systems:  Review of Systems  Constitutional: Negative for fever.  HENT: Negative for congestion.   Eyes: Negative for blurred vision.  Respiratory: Negative for shortness of breath.   Cardiovascular: Negative for chest pain.  Gastrointestinal: Negative for abdominal pain.  Genitourinary: Negative for dysuria.  Musculoskeletal: Negative for myalgias and falls.  Skin: Negative for rash.  Neurological: Negative for dizziness.  Endo/Heme/Allergies: Does not bruise/bleed easily.  Psychiatric/Behavioral: Negative for memory loss.     Past Medical History  Diagnosis Date  . Diabetes mellitus    . Hypertension   . Chest pain, unspecified   . Herpes zoster with other nervous system complications(053.19)   . Nonspecific reaction to tuberculin skin test without active tuberculosis(795.51)   . Acute upper respiratory infections of unspecified site   . Type II or unspecified type diabetes mellitus with renal manifestations, uncontrolled   . Hypertensive renal disease, benign   . Proteinuria   . Type II or unspecified type diabetes mellitus with renal manifestations, not stated as uncontrolled   . Anemia, unspecified   . Unspecified disorder of kidney and ureter   . Disorder of bone and cartilage, unspecified   . Pain in joint, lower leg   . Diarrhea   . Hypercalcemia   . Other and unspecified hyperlipidemia   . Unspecified essential hypertension   . Atherosclerosis of native arteries of the extremities, unspecified   . DM (diabetes mellitus) type II controlled with renal manifestation   . Postherpetic neuralgia   . Nonspecific tuberculin test reaction   . Anemia   . Peripheral arterial disease   . Chronic kidney disease (CKD), stage II (mild)     Past Surgical History  Procedure Laterality Date  . Removal of cyst from hand    . Hysterectomy    . Tonsillectomy    . Removal of tumor from foot    . Incision and drainage Left 05/27/14    sebacous cyst, ear  Social History:   reports that she has quit smoking. Her smoking use included Cigarettes. She smoked 0.00 packs per day. She has never used smokeless tobacco. She reports that she does not drink alcohol or use illicit drugs.  Family History  Problem Relation Age of Onset  . Diabetes Mother   . Diabetes Father   . Diabetes Sister   . Diabetes Sister     Medications: Patient's Medications  New Prescriptions   No medications on file  Previous Medications   AMLODIPINE (NORVASC) 10 MG TABLET    Take one tablet by mouth daily for high blood pressure   B-D ULTRAFINE III SHORT PEN 31G X 8 MM MISC    USE WITH LANTUS  EVERY DAY FOR BLOOD SUGAR   CANAGLIFLOZIN (INVOKANA) 100 MG TABS    Take 1 tablet (100 mg total) by mouth daily.   CARVEDILOL (COREG) 25 MG TABLET    Take 1 tablet (25 mg total) by mouth 2 (two) times daily with a meal.   CLOPIDOGREL (PLAVIX) 75 MG TABLET    Take 1 tablet (75 mg total) by mouth daily.   DOXYCYCLINE (VIBRAMYCIN) 100 MG CAPSULE    Take 1 capsule (100 mg total) by mouth 2 (two) times daily.   ENALAPRIL (VASOTEC) 10 MG TABLET    Take 10 mg by mouth daily.   INSULIN GLARGINE (LANTUS SOLOSTAR) 100 UNIT/ML SOPN    Inject 44 units under the skin daily for diabetes.   INSULIN LISPRO (HUMALOG KWIKPEN) 100 UNIT/ML SOPN    Inject 5 Units into the skin 3 (three) times daily with meals.   LOVASTATIN (MEVACOR) 20 MG TABLET    Take one tablet by mouth every day for cholesterol   METFORMIN (GLUCOPHAGE) 500 MG TABLET    Take 2 tablets (1,000 mg total) by mouth 2 (two) times daily with a meal.   NITROSTAT 0.4 MG SL TABLET       ONE TOUCH ULTRA TEST TEST STRIP       TDAP (BOOSTRIX) 5-2.5-18.5 LF-MCG/0.5 INJECTION    Inject 0.5 mLs into the muscle once.   VALSARTAN-HYDROCHLOROTHIAZIDE (DIOVAN-HCT) 320-25 MG PER TABLET    Take 1 tablet by mouth daily.  Modified Medications   No medications on file  Discontinued Medications   No medications on file     Physical Exam: Filed Vitals:   06/17/14 1500  BP: 118/72  Pulse: 72  Temp: 98.1 F (36.7 C)  TempSrc: Oral  Weight: 143 lb (64.864 kg)  Physical Exam  Constitutional: She is oriented to person, place, and time. She appears well-developed and well-nourished.  Eyes:  glasses  Cardiovascular: Normal rate, regular rhythm, normal heart sounds and intact distal pulses.   Pulmonary/Chest: Effort normal and breath sounds normal.  Abdominal: Soft. Bowel sounds are normal. She exhibits no distension and no mass. There is no tenderness.  Musculoskeletal: Normal range of motion.  Neurological: She is alert and oriented to person, place, and time.    Skin: Skin is warm and dry.     Labs reviewed: Basic Metabolic Panel:  Recent Labs  11/16/13 1018 02/18/14 0927 05/09/14 0918  NA 143 137 138  K 3.7 4.1 4.1  CL 101 101 100  CO2 25 20 24   GLUCOSE 140* 256* 142*  BUN 15 12 22   CREATININE 1.02* 1.04* 1.28*  CALCIUM 10.3* 10.2 10.1   Liver Function Tests:  Recent Labs  11/16/13 1018 02/18/14 0927 05/09/14 0918  AST 11 13 15   ALT 7 9  9  ALKPHOS 71 72 70  BILITOT 0.4 0.4 0.2  PROT 6.2 6.2 6.6  Lipid Panel:  Recent Labs  08/16/13 0909 11/16/13 1018 05/09/14 0918  HDL 58 60 61  LDLCALC 68 98 103*  TRIG 78 88 90  CHOLHDL 2.4 2.9 3.0   Lab Results  Component Value Date   HGBA1C 8.8* 05/09/2014  Assessment/Plan 1. Type II diabetes mellitus with renal manifestations, uncontrolled -poorly controlled due to poor dietary and medication adherence -is supposed to be taking invokana, but ran out of samples and didn't call or do anything--Cathey had sent prescription to pharmacy and she just would have had to pick it up -cont lantus and humalog but needs to check glucose Also continue metformin 1000mg  po bid with meals -cont arb  2. Chronic kidney disease (CKD), stage II (mild) - cont arb -check for secondary hyperparathyroidism - Calcium, ionized; Future - PTH, Intact and Calcium; Future - Alkaline phosphatase; Future -avoid nsaids and other nephrotoxic agents and dose adjust -monitor and d/c metformin if cr creeps up  3. Atherosclerosis of native arteries of the extremities with intermittent claudication -noted, discussed importance of good glucose control, diet and exercise with this also -history changes each visit so difficult to tell if this is true claudication  4. Patient nonadherence -she was educated again today on importance of taking her meds, checking her glucose and following a diabetic diet and exercising--she was reminded about communicating with the office if she does not have a medication and the  pharmacy has been unable to get it for her  Labs/tests ordered:   Orders Placed This Encounter  Procedures  . Calcium, ionized    Standing Status: Future     Number of Occurrences:      Standing Expiration Date: 10/13/2014  . PTH, Intact and Calcium    Standing Status: Future     Number of Occurrences:      Standing Expiration Date: 10/13/2014  . Alkaline phosphatase    Standing Status: Future     Number of Occurrences:      Standing Expiration Date: 10/13/2014    Next appt:  3 mos

## 2014-06-21 ENCOUNTER — Other Ambulatory Visit: Payer: Self-pay | Admitting: *Deleted

## 2014-06-21 MED ORDER — CLOPIDOGREL BISULFATE 75 MG PO TABS: 75.0000 mg | ORAL_TABLET | Freq: Every day | ORAL | Status: DC

## 2014-06-21 NOTE — Telephone Encounter (Signed)
[employer] sent letter stating that patient can get #90 day supply Rx's on her medications for the same price as a 30 day supply. Dr. Mariea Clonts signed letter and we faxed it back to [facility] fax [phone]. Patient Notified. Patient stated that she needs a refill on her Plavix. Faxed Rx for Plavix  #90 into [facility]  Member ID #: [id number]

## 2014-07-02 ENCOUNTER — Other Ambulatory Visit: Payer: Self-pay

## 2014-07-02 DIAGNOSIS — E1165 Type 2 diabetes mellitus with hyperglycemia: Principal | ICD-10-CM

## 2014-07-02 DIAGNOSIS — E1129 Type 2 diabetes mellitus with other diabetic kidney complication: Secondary | ICD-10-CM

## 2014-07-02 DIAGNOSIS — IMO0002 Reserved for concepts with insufficient information to code with codable children: Secondary | ICD-10-CM

## 2014-07-02 MED ORDER — INSULIN GLARGINE 100 UNIT/ML SOLOSTAR PEN: PEN_INJECTOR | SUBCUTANEOUS | Status: DC

## 2014-07-02 MED ORDER — VALSARTAN-HYDROCHLOROTHIAZIDE 320-25 MG PO TABS: 1.0000 | ORAL_TABLET | Freq: Every day | ORAL | Status: DC

## 2014-07-02 MED ORDER — OMEPRAZOLE 20 MG PO CPDR: 20.0000 mg | DELAYED_RELEASE_CAPSULE | Freq: Every day | ORAL | Status: DC

## 2014-07-02 MED ORDER — CARVEDILOL 25 MG PO TABS: 25.0000 mg | ORAL_TABLET | Freq: Two times a day (BID) | ORAL | Status: DC

## 2014-07-02 MED ORDER — LOVASTATIN 20 MG PO TABS: ORAL_TABLET | ORAL | Status: DC

## 2014-07-02 MED ORDER — METFORMIN HCL 500 MG PO TABS: 1000.0000 mg | ORAL_TABLET | Freq: Two times a day (BID) | ORAL | Status: DC

## 2014-07-02 MED ORDER — AMLODIPINE BESYLATE 10 MG PO TABS: ORAL_TABLET | ORAL | Status: DC

## 2014-07-02 MED ORDER — CANAGLIFLOZIN 100 MG PO TABS: 100.0000 mg | ORAL_TABLET | Freq: Every day | ORAL | Status: DC

## 2014-07-02 MED ORDER — ENALAPRIL MALEATE 10 MG PO TABS: 10.0000 mg | ORAL_TABLET | Freq: Every day | ORAL | Status: DC

## 2014-07-02 NOTE — Telephone Encounter (Signed)
Patient called triage line requesting all rx's to be sent for 90. RX's sent. I called the pharmacy and had them void any rx's on file for 30 day supply for patient is wanting all medications for 90 day supplies

## 2014-07-30 ENCOUNTER — Encounter (HOSPITAL_COMMUNITY): Payer: Self-pay | Admitting: Emergency Medicine

## 2014-07-30 ENCOUNTER — Emergency Department (HOSPITAL_COMMUNITY): Payer: Medicare Other

## 2014-07-30 ENCOUNTER — Emergency Department (HOSPITAL_COMMUNITY)
Admission: EM | Admit: 2014-07-30 | Discharge: 2014-07-31 | Disposition: A | Discharge status: Home / Self Care | Payer: Medicare Other | Attending: Emergency Medicine | Admitting: Emergency Medicine

## 2014-07-30 DIAGNOSIS — Z87891 Personal history of nicotine dependence: Secondary | ICD-10-CM | POA: Diagnosis not present

## 2014-07-30 DIAGNOSIS — Z862 Personal history of diseases of the blood and blood-forming organs and certain disorders involving the immune mechanism: Secondary | ICD-10-CM | POA: Insufficient documentation

## 2014-07-30 DIAGNOSIS — E119 Type 2 diabetes mellitus without complications: Secondary | ICD-10-CM | POA: Diagnosis not present

## 2014-07-30 DIAGNOSIS — Z7902 Long term (current) use of antithrombotics/antiplatelets: Secondary | ICD-10-CM | POA: Insufficient documentation

## 2014-07-30 DIAGNOSIS — Z8619 Personal history of other infectious and parasitic diseases: Secondary | ICD-10-CM | POA: Diagnosis not present

## 2014-07-30 DIAGNOSIS — Z79899 Other long term (current) drug therapy: Secondary | ICD-10-CM | POA: Diagnosis not present

## 2014-07-30 DIAGNOSIS — M79602 Pain in left arm: Secondary | ICD-10-CM

## 2014-07-30 DIAGNOSIS — M79609 Pain in unspecified limb: Secondary | ICD-10-CM | POA: Diagnosis not present

## 2014-07-30 DIAGNOSIS — Z792 Long term (current) use of antibiotics: Secondary | ICD-10-CM | POA: Diagnosis not present

## 2014-07-30 DIAGNOSIS — R739 Hyperglycemia, unspecified: Secondary | ICD-10-CM

## 2014-07-30 DIAGNOSIS — E1129 Type 2 diabetes mellitus with other diabetic kidney complication: Secondary | ICD-10-CM | POA: Diagnosis not present

## 2014-07-30 DIAGNOSIS — I129 Hypertensive chronic kidney disease with stage 1 through stage 4 chronic kidney disease, or unspecified chronic kidney disease: Secondary | ICD-10-CM | POA: Diagnosis not present

## 2014-07-30 DIAGNOSIS — N182 Chronic kidney disease, stage 2 (mild): Secondary | ICD-10-CM | POA: Diagnosis not present

## 2014-07-30 DIAGNOSIS — Z794 Long term (current) use of insulin: Secondary | ICD-10-CM | POA: Diagnosis not present

## 2014-07-30 DIAGNOSIS — E785 Hyperlipidemia, unspecified: Secondary | ICD-10-CM | POA: Diagnosis not present

## 2014-07-30 LAB — CBC WITH DIFFERENTIAL/PLATELET
Basophils Absolute: 0 10*3/uL (ref 0.0–0.1)
Basophils Relative: 0 % (ref 0–1)
Eosinophils Absolute: 0.2 10*3/uL (ref 0.0–0.7)
Eosinophils Relative: 4 % (ref 0–5)
HCT: 33.6 % — ABNORMAL LOW (ref 36.0–46.0)
Hemoglobin: 11.3 g/dL — ABNORMAL LOW (ref 12.0–15.0)
LYMPHS PCT: 33 % (ref 12–46)
Lymphs Abs: 1.7 10*3/uL (ref 0.7–4.0)
MCH: 30.3 pg (ref 26.0–34.0)
MCHC: 33.6 g/dL (ref 30.0–36.0)
MCV: 90.1 fL (ref 78.0–100.0)
Monocytes Absolute: 0.4 10*3/uL (ref 0.1–1.0)
Monocytes Relative: 7 % (ref 3–12)
NEUTROS ABS: 3 10*3/uL (ref 1.7–7.7)
Neutrophils Relative %: 56 % (ref 43–77)
PLATELETS: 192 10*3/uL (ref 150–400)
RBC: 3.73 MIL/uL — AB (ref 3.87–5.11)
RDW: 12.1 % (ref 11.5–15.5)
WBC: 5.3 10*3/uL (ref 4.0–10.5)

## 2014-07-30 LAB — URINALYSIS, ROUTINE W REFLEX MICROSCOPIC
Bilirubin Urine: NEGATIVE
Hgb urine dipstick: NEGATIVE
Ketones, ur: NEGATIVE mg/dL
LEUKOCYTES UA: NEGATIVE
NITRITE: NEGATIVE
PH: 6 (ref 5.0–8.0)
Protein, ur: 100 mg/dL — AB
SPECIFIC GRAVITY, URINE: 1.025 (ref 1.005–1.030)
Urobilinogen, UA: 0.2 mg/dL (ref 0.0–1.0)

## 2014-07-30 LAB — COMPREHENSIVE METABOLIC PANEL
ALBUMIN: 3.6 g/dL (ref 3.5–5.2)
ALT: 12 U/L (ref 0–35)
AST: 14 U/L (ref 0–37)
Alkaline Phosphatase: 90 U/L (ref 39–117)
Anion gap: 15 (ref 5–15)
BUN: 14 mg/dL (ref 6–23)
CALCIUM: 9.6 mg/dL (ref 8.4–10.5)
CO2: 21 meq/L (ref 19–32)
CREATININE: 1.02 mg/dL (ref 0.50–1.10)
Chloride: 100 mEq/L (ref 96–112)
GFR calc Af Amer: 60 mL/min — ABNORMAL LOW (ref >90)
GFR, EST NON AFRICAN AMERICAN: 52 mL/min — AB (ref >90)
Glucose, Bld: 345 mg/dL — ABNORMAL HIGH (ref 70–99)
Potassium: 3.8 mEq/L (ref 3.7–5.3)
Sodium: 136 mEq/L — ABNORMAL LOW (ref 137–147)
Total Bilirubin: 0.2 mg/dL — ABNORMAL LOW (ref 0.3–1.2)
Total Protein: 6.7 g/dL (ref 6.0–8.3)

## 2014-07-30 LAB — I-STAT TROPONIN, ED: Troponin i, poc: 0 ng/mL (ref 0.00–0.08)

## 2014-07-30 LAB — URINE MICROSCOPIC-ADD ON

## 2014-07-30 LAB — CBG MONITORING, ED
GLUCOSE-CAPILLARY: 330 mg/dL — AB (ref 70–99)
Glucose-Capillary: 225 mg/dL — ABNORMAL HIGH (ref 70–99)

## 2014-07-30 MED ORDER — ASPIRIN 81 MG PO CHEW
324.0000 mg | CHEWABLE_TABLET | Freq: Once | ORAL | Status: AC
  Administered 2014-07-31: 324 mg via ORAL
  Filled 2014-07-30: qty 4

## 2014-07-30 NOTE — ED Provider Notes (Signed)
CSN: [id number]     Arrival date & time 07/30/14  1911 History   First MD Initiated Contact with Patient 07/30/14 2252     Chief Complaint  Patient presents with  . Hyperglycemia     (Consider location/radiation/quality/duration/timing/severity/associated sxs/prior Treatment) HPI Comments: 79 year old female with a history of diabetes as well as high blood pressure and high cholesterol, stage II kidney disease, peripheral vascular disease of her legs presents with a complaint of hyperglycemia. 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 She states that approximately 3:00 PM she developed a pain in her left forearm. This is unusual for her, she does have pain in her left shoulder when she holds her grandchild but not usually in the forearm. This has been intermittent, nothing seems to make this better or worse, it is not associated with fevers, chills, nausea, vomiting, coughing or shortness of breath. She states that exertion helps the pain go away, she has no associated radiation to her shoulder, her back and has no pain in her chest. She states that when she walks she does have pain in her bilateral legs below the knees and has been worked up for claudication by a Hydrographic surveyor, she declined intervention at that time. This has not worsened over time. She endorses having normal studies in the past including a stress test which she states was normal. She is unable to tell me who performed this or where it was performed but believes that it was within the last 2 years. She has been told to take nitroglycerin as needed for her chest pain which she was told was not related to her heart. At this time her symptoms are mild. She denies frequency of urination but does have  intermittent dysuria, denies excessive thirst, denies lightheadedness or near syncope. Because of her hyperglycemia she did take increased amounts of insulin prior to arrival, she took extra Lantus this evening hoping that would help with her arm pain. When she took the extra medication it seemed to make her pain go away.  Patient is a 79 y.o. female presenting with hyperglycemia. The history is provided by the patient and medical records.  Hyperglycemia   Past Medical History  Diagnosis Date  . Diabetes mellitus   . Hypertension   . Chest pain, unspecified   . Herpes zoster with other nervous system complications(053.19)   . Nonspecific reaction to tuberculin skin test without active tuberculosis(795.51)   . Acute upper respiratory infections of unspecified site   . Type II or unspecified type diabetes mellitus with renal manifestations, uncontrolled   . Hypertensive renal disease, benign   . Proteinuria   . Type II or unspecified type diabetes mellitus with renal manifestations, not stated as uncontrolled   . Anemia, unspecified   . Unspecified disorder of kidney and ureter   . Disorder of bone and cartilage, unspecified   . Pain in joint, lower leg   . Diarrhea   . Hypercalcemia   . Other and unspecified hyperlipidemia   . Unspecified essential hypertension   . Atherosclerosis of native arteries of the extremities, unspecified   . DM (diabetes mellitus) type II controlled with renal manifestation   . Postherpetic neuralgia   .  Nonspecific tuberculin test reaction   . Anemia   . Peripheral arterial disease   . Chronic kidney disease (CKD), stage II (mild)    Past Surgical History  Procedure Laterality Date  . Removal of cyst from hand    . Hysterectomy    . Tonsillectomy    . Removal of tumor from foot    . Incision and drainage Left 05/27/14    sebacous cyst, ear   Family History  Problem Relation Age of Onset  . Diabetes Mother   . Diabetes Father   . Diabetes Sister    . Diabetes Sister    History  Substance Use Topics  . Smoking status: Former Smoker    Types: Cigarettes  . Smokeless tobacco: Never Used     Comment: Quit about age 35   . Alcohol Use: No   OB History   Grav Para Term Preterm Abortions TAB SAB Ect Mult Living                 Review of Systems  All other systems reviewed and are negative.     Allergies  Review of patient's allergies indicates no known allergies.  Home Medications   Prior to Admission medications   Medication Sig Start Date End Date Taking? Authorizing Provider  amLODipine (NORVASC) 10 MG tablet Take 10 mg by mouth daily. Take one tablet by mouth daily for high blood pressure 07/02/14  Yes Tiffany L Reed, DO  Canagliflozin 100 MG TABS Take 100 mg by mouth daily. 07/02/14  Yes Tiffany L Reed, DO  carvedilol (COREG) 25 MG tablet Take 1 tablet (25 mg total) by mouth 2 (two) times daily with a meal. 07/02/14  Yes Tiffany L Reed, DO  clopidogrel (PLAVIX) 75 MG tablet Take 1 tablet (75 mg total) by mouth daily. 06/21/14  Yes Tiffany L Reed, DO  doxycycline (VIBRAMYCIN) 100 MG capsule Take 100 mg by mouth 2 (two) times daily. Take everyday per patient 05/27/14  Yes Harden Mo, MD  enalapril (VASOTEC) 10 MG tablet Take 10 mg by mouth at bedtime. 07/02/14  Yes Tiffany L Reed, DO  Insulin Glargine (LANTUS SOLOSTAR) 100 UNIT/ML Solostar Pen Inject 44 units under the skin daily for diabetes. 07/02/14  Yes Tiffany L Reed, DO  insulin lispro (HUMALOG KWIKPEN) 100 UNIT/ML SOPN Inject 5 Units into the skin 3 (three) times daily with meals. 08/20/13  Yes Tivis Ringer, RPH-CPP  lovastatin (MEVACOR) 20 MG tablet Take 20 mg by mouth at bedtime. Take one tablet by mouth every day for cholesterol 07/02/14  Yes Tiffany L Reed, DO  metFORMIN (GLUCOPHAGE) 500 MG tablet Take 2 tablets (1,000 mg total) by mouth 2 (two) times daily with a meal. 07/02/14  Yes Tiffany L Reed, DO  NITROSTAT 0.4 MG SL tablet Place 0.4 mg under the tongue every 5 (five)  minutes as needed for chest pain.  03/13/13  Yes Historical Provider, MD  B-D ULTRAFINE III SHORT PEN 31G X 8 MM MISC USE WITH LANTUS EVERY DAY FOR BLOOD SUGAR    Tiffany L Reed, DO  ONE TOUCH ULTRA TEST test strip  07/31/13   Historical Provider, MD   BP 164/69  Pulse 69  Temp(Src) 98.4 F (36.9 C) (Oral)  Resp 12  Ht 5\' 5"  (1.651 m)  Wt 142 lb 6 oz (64.581 kg)  BMI 23.69 kg/m2  SpO2 100% Physical Exam  Nursing note and vitals reviewed. Constitutional: She appears well-developed and well-nourished. No distress.  HENT:  Head:  Normocephalic and atraumatic.  Mouth/Throat: Oropharynx is clear and moist. No oropharyngeal exudate.  Eyes: Conjunctivae and EOM are normal. Pupils are equal, round, and reactive to light. Right eye exhibits no discharge. Left eye exhibits no discharge. No scleral icterus.  Neck: Normal range of motion. Neck supple. No JVD present. No thyromegaly present.  Cardiovascular: Normal rate, regular rhythm, normal heart sounds and intact distal pulses.  Exam reveals no gallop and no friction rub.   No murmur heard. No murmurs rubs or gallops, no JVD  Pulmonary/Chest: Effort normal and breath sounds normal. No respiratory distress. She has no wheezes. She has no rales.  Abdominal: Soft. Bowel sounds are normal. She exhibits no distension and no mass. There is no tenderness.  Musculoskeletal: Normal range of motion. She exhibits no edema and no tenderness.  Weak but palpable pulses at the bilateral dorsalis pedis and posterior tibial arteries, capillary refill less than 2 seconds at the toes. No peripheral edema  Lymphadenopathy:    She has no cervical adenopathy.  Neurological: She is alert. Coordination normal.  Skin: Skin is warm and dry. No rash noted. No erythema.  Psychiatric: She has a normal mood and affect. Her behavior is normal.    ED Course  Procedures (including critical care time) Labs Review Labs Reviewed  COMPREHENSIVE METABOLIC PANEL - Abnormal;  Notable for the following:    Sodium 136 (*)    Glucose, Bld 345 (*)    Total Bilirubin 0.2 (*)    GFR calc non Af Amer 52 (*)    GFR calc Af Amer 60 (*)    All other components within normal limits  CBC WITH DIFFERENTIAL - Abnormal; Notable for the following:    RBC 3.73 (*)    Hemoglobin 11.3 (*)    HCT 33.6 (*)    All other components within normal limits  URINALYSIS, ROUTINE W REFLEX MICROSCOPIC - Abnormal; Notable for the following:    APPearance HAZY (*)    Glucose, UA >1000 (*)    Protein, ur 100 (*)    All other components within normal limits  URINE MICROSCOPIC-ADD ON - Abnormal; Notable for the following:    Squamous Epithelial / LPF FEW (*)    All other components within normal limits  CBG MONITORING, ED - Abnormal; Notable for the following:    Glucose-Capillary 330 (*)    All other components within normal limits  CBG MONITORING, ED - Abnormal; Notable for the following:    Glucose-Capillary 225 (*)    All other components within normal limits  CBG MONITORING, ED - Abnormal; Notable for the following:    Glucose-Capillary 163 (*)    All other components within normal limits  I-STAT TROPOININ, ED    Imaging Review Dg Chest 2 View  07/31/2014   CLINICAL DATA:  Short of breath  EXAM: CHEST  2 VIEW  COMPARISON:  01/11/2012  FINDINGS: The heart size and mediastinal contours are within normal limits. Both lungs are clear. The visualized skeletal structures are unremarkable.  IMPRESSION: No active cardiopulmonary disease.   Electronically Signed   By: Franchot Gallo M.D.   On: 07/31/2014 00:39     EKG Interpretation None      MDM   Final diagnoses:  Hyperglycemia  Pain of left upper extremity    The patient has no reproducible arm pain on exam, she has no swelling of her arms, normal sensation and strength of the bilateral upper and lower extremities. She will need further workup including  a troponin and an EKG though given her past if she truly has had a normal  stress test within the last year she would be lower risk for acute myocardial infarction or coronary artery disease. That being said she is known to have significant peripheral arterial disease of her lower extremities and diabetes which are risk factors in and of themselves. She has had no chest pain or shortness of breath.  ED ECG REPORT  I personally interpreted this EKG   Date: 07/31/2014   Rate: 60  Rhythm: normal sinus rhythm  QRS Axis: left  Intervals: normal  ST/T Wave abnormalities: nonspecific ST/T changes  Conduction Disutrbances:left anterior fascicular block  Narrative Interpretation:   Old EKG Reviewed: unchanged c/w 03/01/13.    The patient was informed that her EKG was abnormal but unchanged from prior EKG, her lab work has been normal and her chest x-ray has been normal. She is pain-free, she does have a gradually rising blood pressure but has not had her evening blood pressure medication which she states she wants to take immediately when she goes on. She is already on Plavix, she appears stable for discharge and agrees to return to the hospital immediately if her symptoms worsen.  Johnna Acosta, MD 07/31/14 [phone]

## 2014-07-30 NOTE — ED Notes (Signed)
Pt states took her blood sugar at home and it was over 400 which is very elevated than her normal CBG. Pt also concerned because she was having pain in her left arm.

## 2014-07-31 DIAGNOSIS — E119 Type 2 diabetes mellitus without complications: Secondary | ICD-10-CM | POA: Diagnosis not present

## 2014-07-31 LAB — CBG MONITORING, ED: Glucose-Capillary: 163 mg/dL — ABNORMAL HIGH (ref 70–99)

## 2014-07-31 NOTE — Discharge Instructions (Signed)
Please call your doctor for a followup appointment within 24-48 hours. When you talk to your doctor please let them know that you were seen in the emergency department and have them acquire all of your records so that they can discuss the findings with you and formulate a treatment plan to fully care for your new and ongoing problems. ° °

## 2014-08-19 ENCOUNTER — Ambulatory Visit (INDEPENDENT_AMBULATORY_CARE_PROVIDER_SITE_OTHER): Payer: Medicare Other | Admitting: Pharmacotherapy

## 2014-08-19 ENCOUNTER — Encounter: Payer: Self-pay | Admitting: Pharmacotherapy

## 2014-08-19 ENCOUNTER — Ambulatory Visit: Payer: Medicare Other | Admitting: Pharmacotherapy

## 2014-08-19 VITALS — BP 132/70 | HR 58 | Temp 97.8°F | Ht 64.75 in | Wt 140.6 lb

## 2014-08-19 DIAGNOSIS — N182 Chronic kidney disease, stage 2 (mild): Secondary | ICD-10-CM

## 2014-08-19 DIAGNOSIS — I10 Essential (primary) hypertension: Secondary | ICD-10-CM

## 2014-08-19 DIAGNOSIS — E1129 Type 2 diabetes mellitus with other diabetic kidney complication: Secondary | ICD-10-CM

## 2014-08-19 DIAGNOSIS — I70219 Atherosclerosis of native arteries of extremities with intermittent claudication, unspecified extremity: Secondary | ICD-10-CM

## 2014-08-19 MED ORDER — GLUCOSE BLOOD VI STRP: ORAL_STRIP | Status: DC

## 2014-08-19 MED ORDER — INSULIN LISPRO 100 UNIT/ML (KWIKPEN): 5.0000 [IU] | PEN_INJECTOR | Freq: Three times a day (TID) | SUBCUTANEOUS | Status: DC

## 2014-08-19 MED ORDER — NITROGLYCERIN 0.4 MG SL SUBL: 0.4000 mg | SUBLINGUAL_TABLET | SUBLINGUAL | Status: DC | PRN

## 2014-08-19 NOTE — Patient Instructions (Signed)
1.  Continue Lantus 46 units daily 2.  Continue Humalog 5 units with each meal. 3.  Continue Invokana 100mg  daily 4.  Continue Metformin 1000mg  twice daily 5.  Must monitor blood sugar daily. 6.  Drink lots of water. 7.  No skipping meals.

## 2014-08-19 NOTE — Progress Notes (Signed)
Subjective:    Monique Cabrera is a 79 y.o.African American female who presents for follow-up of Type 2 diabetes mellitus.   She went to the ED 07/30/14 with BG >400 and pain in left arm.  Cardiac workup was negative.  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  She has been eating more "sweets" Only SMBG once a week .  Out of test strips.  Did not bring blood glucose meter. Much more concerned with lows that highs Reports lowest BG: 81m/dl She says her average is in the 200's.  She reports taking Lantus 46 units daily rather than 44 units. "issues" with Invokana.  Stopped taking for a while because of the "bad drug" commercials. No dysuria or vaginal yeast infections.  She has had no personal ADR with Invokana. Drinking more water.  Has polydipsia. Having some itching after urinating.  No vaginal discharge. Eating smaller portions to keep weight off.  Has been skipping supper. Scared of gaining weight. No routine exercise.  Limited due to PVD.  Needs surgical intervention (stent) but doesn't want it. Nocturia once per night. Not checking feet.  Denies problems with feet. Vision getting worse.  Saw eye doctor a few months ago.  Has cataracts.  She does not want to take more insulin.  Review of Systems A comprehensive review of systems was negative except for: Eyes: positive for cataracts Cardiovascular: positive for claudication and left arm pain that sent her to ED Genitourinary: positive for nocturia Endocrine: positive for diabetic symptoms including polydipsia and polyuria    Objective:    BP 132/70  Pulse 58  Temp(Src) 97.8 F (36.6 C) (Oral)  Ht 5' 4.75" (1.645 m)  Wt 140 lb 9.6 oz (63.776 kg)  BMI 23.57 kg/m2  SpO2 98%  General:  alert, cooperative and no distress  Oropharynx: normal findings: lips normal without lesions and gums healthy   Eyes:  negative  findings: lids and lashes normal and conjunctivae and sclerae normal   Ears:  external ears normal        Lung: clear to auscultation bilaterally  Heart:  regular rate and rhythm     Extremities: no edema noted  Skin: dry     Neuro: mental status, speech normal, alert and oriented x3 and gait and station normal   Lab Review Glucose (mg/dL)  Date Value  05/09/2014 142*  02/18/2014 256*  11/16/2013 140*     Glucose, Bld (mg/dL)  Date Value  07/30/2014 345*  08/21/2012 131*     CO2 (mEq/L)  Date Value  07/30/2014 21   05/09/2014 24   02/18/2014 20      BUN (mg/dL)  Date Value  07/30/2014 14   05/09/2014 22   02/18/2014 12   11/16/2013 15   08/21/2012 19      Creatinine, Ser (mg/dL)  Date Value  07/30/2014 1.02   05/09/2014 1.28*  02/18/2014 1.04*     Will check A1C, CMP, lipid, and microalbumin today.   Assessment:    Diabetes Mellitus type II, under poor control. Due to lack of self-care habits and missing doses of insulin. HTN at goal <140/90 Will check lipids today.   Plan:    1.  Rx changes: none.  Needs to take all RX as prescribed. 2.  Continue Lantus 46 units daily. 3.  Continue Humalog 5 units with each meal. 4.  Continue Invokana 127m daily.  Counseled at length on risk and benefit of medications.  She is aware and wants to continue the medication. 5.  Continue Metformin 10042mtwice daily. 6.  Counseled on nutrition goals.  Needs to stop skipping meals. 7.  Exercise goal is 30-45 minutes 5 x week. 8.  BP at goal <140/90.  Continue enalapril, amlodipine, and carvedilol. 9.  On losartan without ADR.   Will check lipids today. 1026 Keep Sept OV with Dr. ReMariea Clonts Needs to SMBG daily and bring log to OV for review. 11.  RTC in 3 months (A1C, CMP prior)

## 2014-08-20 LAB — LIPID PANEL
Chol/HDL Ratio: 2.7 ratio units (ref 0.0–4.4)
Cholesterol, Total: 171 mg/dL (ref 100–199)
HDL: 64 mg/dL (ref >39)
LDL Calculated: 95 mg/dL (ref 0–99)
Triglycerides: 61 mg/dL (ref 0–149)
VLDL Cholesterol Cal: 12 mg/dL (ref 5–40)

## 2014-08-20 LAB — COMPREHENSIVE METABOLIC PANEL
ALT: 9 IU/L (ref 0–32)
AST: 15 IU/L (ref 0–40)
Albumin/Globulin Ratio: 1.9 (ref 1.1–2.5)
Albumin: 4.3 g/dL (ref 3.5–4.8)
Alkaline Phosphatase: 76 IU/L (ref 39–117)
BUN/Creatinine Ratio: 16 (ref 11–26)
BUN: 21 mg/dL (ref 8–27)
CO2: 23 mmol/L (ref 18–29)
Calcium: 10.4 mg/dL — ABNORMAL HIGH (ref 8.7–10.3)
Chloride: 101 mmol/L (ref 97–108)
Creatinine, Ser: 1.31 mg/dL — ABNORMAL HIGH (ref 0.57–1.00)
GFR calc Af Amer: 45 mL/min/{1.73_m2} — ABNORMAL LOW (ref >59)
GFR calc non Af Amer: 39 mL/min/{1.73_m2} — ABNORMAL LOW (ref >59)
Globulin, Total: 2.3 g/dL (ref 1.5–4.5)
Glucose: 156 mg/dL — ABNORMAL HIGH (ref 65–99)
Potassium: 4.9 mmol/L (ref 3.5–5.2)
Sodium: 140 mmol/L (ref 134–144)
Total Bilirubin: 0.4 mg/dL (ref 0.0–1.2)
Total Protein: 6.6 g/dL (ref 6.0–8.5)

## 2014-08-20 LAB — MICROALBUMIN / CREATININE URINE RATIO
Creatinine, Ur: 55 mg/dL (ref 15.0–278.0)
MICROALB/CREAT RATIO: 444.4 mg/g creat — ABNORMAL HIGH (ref 0.0–30.0)
Microalbumin, Urine: 244.4 ug/mL — ABNORMAL HIGH (ref 0.0–17.0)

## 2014-08-20 LAB — HEMOGLOBIN A1C
Est. average glucose Bld gHb Est-mCnc: 203 mg/dL
Hgb A1c MFr Bld: 8.7 % — ABNORMAL HIGH (ref 4.8–5.6)

## 2014-08-21 ENCOUNTER — Encounter: Payer: Self-pay | Admitting: *Deleted

## 2014-08-26 ENCOUNTER — Ambulatory Visit: Payer: Medicare Other | Admitting: Pharmacotherapy

## 2014-09-12 ENCOUNTER — Other Ambulatory Visit: Payer: Medicare Other

## 2014-09-16 ENCOUNTER — Ambulatory Visit: Payer: Medicare Other | Admitting: Pharmacotherapy

## 2014-09-19 ENCOUNTER — Ambulatory Visit (INDEPENDENT_AMBULATORY_CARE_PROVIDER_SITE_OTHER): Payer: Medicare Other | Admitting: Internal Medicine

## 2014-09-19 ENCOUNTER — Encounter: Payer: Self-pay | Admitting: Internal Medicine

## 2014-09-19 VITALS — BP 124/72 | HR 78 | Temp 98.9°F | Resp 10 | Ht 65.0 in | Wt 141.0 lb

## 2014-09-19 DIAGNOSIS — E1129 Type 2 diabetes mellitus with other diabetic kidney complication: Secondary | ICD-10-CM

## 2014-09-19 DIAGNOSIS — Z23 Encounter for immunization: Secondary | ICD-10-CM

## 2014-09-19 DIAGNOSIS — N182 Chronic kidney disease, stage 2 (mild): Secondary | ICD-10-CM

## 2014-09-19 DIAGNOSIS — E785 Hyperlipidemia, unspecified: Secondary | ICD-10-CM

## 2014-09-19 DIAGNOSIS — I70219 Atherosclerosis of native arteries of extremities with intermittent claudication, unspecified extremity: Secondary | ICD-10-CM

## 2014-09-19 DIAGNOSIS — B182 Chronic viral hepatitis C: Secondary | ICD-10-CM

## 2014-09-19 DIAGNOSIS — IMO0002 Reserved for concepts with insufficient information to code with codable children: Secondary | ICD-10-CM

## 2014-09-19 DIAGNOSIS — E1165 Type 2 diabetes mellitus with hyperglycemia: Secondary | ICD-10-CM

## 2014-09-19 NOTE — Progress Notes (Signed)
Patient ID: Monique Cabrera, female   DOB: 06-07-1935, 79 y.o.   MRN: [id number]   Location:  [facility]  Code Status: full code  No Known Allergies  Chief Complaint  Patient presents with  . Medical Management of Chronic Issues    3 month follow-up, discuss labs (copy printed)   . Medication Management    Discuss if patient should continue Prilosec   . Medication Management    Patient would like to disucss taking ACEI and ARB [employer] is concerned that patient is taking both)     HPI: Patient is a 79 y.o. seen in the office today for f/u of chronic diseases.   Sugar yesterday 105, 115 last night and 169 this morning.  Did have sugar in her urine due to the invokana.  Has been taking it like she's supposed to.    Asking about prilosec.  No difficulty with acid reflux in a long time.   She is not taking both ace and arb so I'm not sure whey the housecalls nurse said she was.  She's on norvasc and enalapril only for bp.     Doing better b/c checking sugars first thing in morning and when gives herself insulin.  Last saw eye doctor in April--[facility].  Only slight improvement in hba1c since May.  Continues to follow with Tivis Ringer.    Review of Systems:  Review of Systems  Constitutional: Negative for fever and chills.  Eyes: Negative for blurred vision.  Respiratory: Negative for shortness of breath.   Cardiovascular: Negative for chest pain.  Gastrointestinal: Negative for heartburn, abdominal pain, constipation, blood in stool and melena.  Genitourinary: Negative for dysuria, urgency and frequency.  Musculoskeletal: Negative for falls and myalgias.  Skin: Negative for rash.  Neurological: Negative for dizziness and headaches.  Endo/Heme/Allergies: Does not bruise/bleed easily.  Psychiatric/Behavioral: Negative for depression and memory loss.    Past Medical History  Diagnosis Date  . Diabetes mellitus    . Hypertension   . Chest pain, unspecified   . Herpes zoster with other nervous system complications(053.19)   . Nonspecific reaction to tuberculin skin test without active tuberculosis(795.51)   . Acute upper respiratory infections of unspecified site   . Type II or unspecified type diabetes mellitus with renal manifestations, uncontrolled   . Hypertensive renal disease, benign   . Proteinuria   . Type II or unspecified type diabetes mellitus with renal manifestations, not stated as uncontrolled   . Anemia, unspecified   . Unspecified disorder of kidney and ureter   . Disorder of bone and cartilage, unspecified   . Pain in joint, lower leg   . Diarrhea   . Hypercalcemia   . Other and unspecified hyperlipidemia   . Unspecified essential hypertension   . Atherosclerosis of native arteries of the extremities, unspecified   . DM (diabetes mellitus) type II controlled with renal manifestation   . Postherpetic neuralgia   . Nonspecific tuberculin test reaction   . Anemia   . Peripheral arterial disease   . Chronic kidney disease (CKD), stage II (mild)     Past Surgical History  Procedure Laterality Date  . Removal of cyst from hand    . Hysterectomy    . Tonsillectomy    . Removal of tumor from foot    . Incision and drainage Left 05/27/14    sebacous cyst, ear    Social History:   reports that she has  quit smoking. Her smoking use included Cigarettes. She smoked 0.00 packs per day. She has never used smokeless tobacco. She reports that she does not drink alcohol or use illicit drugs.  Family History  Problem Relation Age of Onset  . Diabetes Mother   . Diabetes Father   . Diabetes Sister   . Diabetes Sister     Medications: Patient's Medications  New Prescriptions   No medications on file  Previous Medications   AMLODIPINE (NORVASC) 10 MG TABLET    Take 10 mg by mouth daily. Take one tablet by mouth daily for high blood pressure   B-D ULTRAFINE III SHORT PEN 31G X 8  MM MISC    USE WITH LANTUS EVERY DAY FOR BLOOD SUGAR   CANAGLIFLOZIN 100 MG TABS    Take 100 mg by mouth daily. For Diabetes (Invokana)   CARVEDILOL (COREG) 25 MG TABLET    Take 1 tablet (25 mg total) by mouth 2 (two) times daily with a meal.   CLOPIDOGREL (PLAVIX) 75 MG TABLET    Take 1 tablet (75 mg total) by mouth daily.   ENALAPRIL (VASOTEC) 10 MG TABLET    Take 10 mg by mouth at bedtime.   GLUCOSE BLOOD (ONE TOUCH ULTRA TEST) TEST STRIP    Use as directed.  Dx: 250.40   INSULIN LISPRO (HUMALOG KWIKPEN) 100 UNIT/ML KIWKPEN    Inject 0.05 mLs (5 Units total) into the skin 3 (three) times daily with meals.   LOVASTATIN (MEVACOR) 20 MG TABLET    Take 20 mg by mouth at bedtime. Take one tablet by mouth every day for cholesterol   METFORMIN (GLUCOPHAGE) 500 MG TABLET    Take 2 tablets (1,000 mg total) by mouth 2 (two) times daily with a meal.   NITROGLYCERIN (NITROSTAT) 0.4 MG SL TABLET    Place 1 tablet (0.4 mg total) under the tongue every 5 (five) minutes as needed for chest pain.   OMEPRAZOLE (PRILOSEC) 20 MG CAPSULE    Take 20 mg by mouth at bedtime.   Modified Medications   Modified Medication Previous Medication   INSULIN GLARGINE (LANTUS) 100 UNIT/ML SOLOSTAR PEN Insulin Glargine (LANTUS SOLOSTAR) 100 UNIT/ML Solostar Pen      Inject 46 units under the skin daily for diabetes.    Inject 44 units under the skin daily for diabetes.  Discontinued Medications   DOXYCYCLINE (VIBRAMYCIN) 100 MG CAPSULE    Take 100 mg by mouth 2 (two) times daily. Take everyday per patient     Physical Exam: Filed Vitals:   09/19/14 1042  BP: 124/72  Pulse: 78  Temp: 98.9 F (37.2 C)  TempSrc: Oral  Resp: 10  Height: 5\' 5"  (1.651 m)  Weight: 141 lb (63.957 kg)  SpO2: 98%  Physical Exam  Constitutional: She is oriented to person, place, and time. She appears well-developed and well-nourished.  Eyes:  glasses  Cardiovascular: Normal rate, regular rhythm, normal heart sounds and intact distal pulses.    Pulmonary/Chest: Effort normal and breath sounds normal.  Abdominal: Soft. Bowel sounds are normal. She exhibits no distension and no mass. There is no tenderness.  Musculoskeletal: Normal range of motion. She exhibits no edema and no tenderness.  See diabetic foot exam  Neurological: She is alert and oriented to person, place, and time.  Skin: Skin is warm and dry.  Psychiatric: She has a normal mood and affect.    Labs reviewed: Basic Metabolic Panel:  Recent Labs  05/09/14 0918 07/30/14 2000 08/19/14  1001  NA 138 136* 140  K 4.1 3.8 4.9  CL 100 100 101  CO2 24 21 23   GLUCOSE 142* 345* 156*  BUN 22 14 21   CREATININE 1.28* 1.02 1.31*  CALCIUM 10.1 9.6 10.4*   Liver Function Tests:  Recent Labs  05/09/14 0918 07/30/14 2000 08/19/14 1001  AST 15 14 15   ALT 9 12 9   ALKPHOS 70 90 76  BILITOT 0.2 0.2* 0.4  PROT 6.6 6.7 6.6  ALBUMIN  --  3.6  --    No results found for this basename: LIPASE, AMYLASE,  in the last 8760 hours No results found for this basename: AMMONIA,  in the last 8760 hours CBC:  Recent Labs  07/30/14 2000  WBC 5.3  NEUTROABS 3.0  HGB 11.3*  HCT 33.6*  MCV 90.1  PLT 192   Lipid Panel:  Recent Labs  11/16/13 1018 05/09/14 0918 08/19/14 1001  HDL 60 61 64  LDLCALC 98 103* 95  TRIG 88 90 61  CHOLHDL 2.9 3.0 2.7   Lab Results  Component Value Date   HGBA1C 8.7* 08/19/2014   Assessment/Plan 1. Type II diabetes mellitus with renal manifestations, uncontrolled - improved slightly from last hba1c, will see what it is with next labs before sees Cathey -says she is doing better with latest med changes and is actually checking her sugar at least 2x per day now - Ambulatory referral to Podiatry--previously saw Dr. Paulla Dolly for toenail trimming due to thickened nails and onychymycosis -MA called to get records from her last ophtho visit in April 2015  2. Chronic hepatitis C without hepatic coma -has not been treated, diabetes not well  controlled and pt not very compliant with regimen so doubt she will be able to get treated easily for hep c either  3. Atherosclerosis of native arteries of the extremities with intermittent claudication -does have palpable pulses and no ulcerations  4. Chronic kidney disease (CKD), stage II (mild) -continue to avoid nsaids, cont ace (is not on arb too so I am not sure what the insurance nurse was talking about)  5. Hyperlipidemia with target LDL less than 100 -cont mevacor for this and appropriate diet and exercise  6. Need for prophylactic vaccination and inoculation against influenza -flu shot given  Labs/tests ordered:   Orders Placed This Encounter  Procedures  . Ambulatory referral to Podiatry    Referral Priority:  Routine    Referral Type:  Consultation    Referral Reason:  Specialty Services Required    Requested Specialty:  Podiatry    Number of Visits Requested:  1    Next appt:  3 mos

## 2014-09-20 LAB — HM MAMMOGRAPHY: HM Mammogram: NORMAL

## 2014-10-02 ENCOUNTER — Encounter: Payer: Self-pay | Admitting: Podiatry

## 2014-10-02 ENCOUNTER — Ambulatory Visit (INDEPENDENT_AMBULATORY_CARE_PROVIDER_SITE_OTHER): Payer: Medicare Other | Admitting: Podiatry

## 2014-10-02 VITALS — BP 158/71 | HR 67 | Resp 13 | Ht 65.0 in | Wt 140.0 lb

## 2014-10-02 DIAGNOSIS — B353 Tinea pedis: Secondary | ICD-10-CM

## 2014-10-02 DIAGNOSIS — B351 Tinea unguium: Secondary | ICD-10-CM

## 2014-10-02 DIAGNOSIS — M79676 Pain in unspecified toe(s): Secondary | ICD-10-CM

## 2014-10-02 NOTE — Progress Notes (Signed)
   Subjective:    Patient ID: Monique Cabrera, female    DOB: 04-Oct-1935, 80 y.o.   MRN: [id number]  HPI Comments: Monique Cabrera, 79 year old female, presents the office they for diabetic risk assessment and painful elongated nails. She states that her blood sugar checked just is 115 on the higher side. She has a history of ulceration to her right foot. Denies any intermittent claudication symptoms. Does have some tingling and numbness to her feet. Also states that she has some peeling to her feet for which has been using some moisturizer. No other complaints at this time.  Diabetes      Review of Systems  All other systems reviewed and are negative.      Objective:   Physical Exam AAO x3, NAD DP/PT pulses palpable b/l. CRT < 3 sec Slight decrease in protective sensation with [name] monofilament, vibratory sensation. Achilles tendon reflex intact. Nails hypertrophic, dystrophic, elongated, brittle, yellow-brown discoloration x10. No surrounding erythema or drainage. Dry scaly skin on the plantar aspect of the feet. This is likely result of tinea pedis. No open lesions or pre-ulcerative lesions. MMT 5/5, ROM WNL      Assessment & Plan:  79 year old female symptomatically onychomycosis, likely tinea pedis. -Various treatment options discussed including alternatives, risks, complications. -Nail sharply debrided [id number] without complications. -Recommended OTC Lotrisone cream for likely tinea pedis. -Discussed with her [employer] symptoms of neuropathy. Discussed. Treatment with her primary care physician. -Discussed importance of daily foot inspection. -Followup in 3 months or sooner if any problems are to arise or any change in symptoms. In the meantime call the office with any questions, concerns

## 2014-10-02 NOTE — Patient Instructions (Signed)
Onychomycosis/Fungal Toenails  WHAT IS IT? An infection that lies within the keratin of your nail plate that is caused by a fungus.  WHY ME? Fungal infections affect all ages, sexes, races, and creeds.  There may be many factors that predispose you to a fungal infection such as age, coexisting medical conditions such as diabetes, or an autoimmune disease; stress, medications, fatigue, genetics, etc.  Bottom line: fungus thrives in a warm, moist environment and your shoes offer such a location.  IS IT CONTAGIOUS? Theoretically, yes.  You do not want to share shoes, nail clippers or files with someone who has fungal toenails.  Walking around barefoot in the same room or sleeping in the same bed is unlikely to transfer the organism.  It is important to realize, however, that fungus can spread easily from one nail to the next on the same foot.  HOW DO WE TREAT THIS?  There are several ways to treat this condition.  Treatment may depend on many factors such as age, medications, pregnancy, liver and kidney conditions, etc.  It is best to ask your doctor which options are available to you.  1. No treatment.   Unlike many other medical concerns, you can live with this condition.  However for many people this can be a painful condition and may lead to ingrown toenails or a bacterial infection.  It is recommended that you keep the nails cut short to help reduce the amount of fungal nail. 2. Topical treatment.  These range from herbal remedies to prescription strength nail lacquers.  About 40-50% effective, topicals require twice daily application for approximately 9 to 12 months or until an entirely new nail has grown out.  The most effective topicals are medical grade medications available through physicians offices. 3. Oral antifungal medications.  With an 80-90% cure rate, the most common oral medication requires 3 to 4 months of therapy and stays in your system for a year as the new nail grows out.  Oral  antifungal medications do require blood work to make sure it is a safe drug for you.  A liver function panel will be performed prior to starting the medication and after the first month of treatment.  It is important to have the blood work performed to avoid any harmful side effects.  In general, this medication safe but blood work is required. 4. Laser Therapy.  This treatment is performed by applying a specialized laser to the affected nail plate.  This therapy is noninvasive, fast, and non-painful.  It is not covered by insurance and is therefore, out of pocket.  The results have been very good with a 80-95% cure rate.  [facility] is the only practice in the area to offer this therapy. Permanent Nail Avulsion.  Removing the entire nail so that a new nail will not grow back.Athlete's Foot Athlete's foot (tinea pedis) is a fungal infection of the skin on the feet. It often occurs on the skin between the toes or underneath the toes. It can also occur on the soles of the feet. Athlete's foot is more likely to occur in hot, humid weather. Not washing your feet or changing your socks often enough can contribute to athlete's foot. The infection can spread from person to person (contagious). CAUSES Athlete's foot is caused by a fungus. This fungus thrives in warm, moist places. Most people get athlete's foot by sharing shower stalls, towels, and wet floors with an infected person. People with weakened immune systems, including  those with diabetes, may be more likely to get athlete's foot. SYMPTOMS   Itchy areas between the toes or on the soles of the feet.  White, flaky, or scaly areas between the toes or on the soles of the feet.  Tiny, intensely itchy blisters between the toes or on the soles of the feet.  Tiny cuts on the skin. These cuts can develop a bacterial infection.  Thick or discolored toenails. DIAGNOSIS  Your caregiver can usually tell what the problem is by doing a physical  exam. Your caregiver may also take a skin sample from the rash area. The skin sample may be examined under a microscope, or it may be tested to see if fungus will grow in the sample. A sample may also be taken from your toenail for testing. TREATMENT  Over-the-counter and prescription medicines can be used to kill the fungus. These medicines are available as powders or creams. Your caregiver can suggest medicines for you. Fungal infections respond slowly to treatment. You may need to continue using your medicine for several weeks. PREVENTION   Do not share towels.  Wear sandals in wet areas, such as shared locker rooms and shared showers.  Keep your feet dry. Wear shoes that allow air to circulate. Wear cotton or wool socks. HOME CARE INSTRUCTIONS   Take medicines as directed by your caregiver. Do not use steroid creams on athlete's foot.  Keep your feet clean and cool. Wash your feet daily and dry them thoroughly, especially between your toes.  Change your socks every day. Wear cotton or wool socks. In hot climates, you may need to change your socks 2 to 3 times per day.  Wear sandals or canvas tennis shoes with good air circulation.  If you have blisters, soak your feet in Burow's solution or Epsom salts for 20 to 30 minutes, 2 times a day to dry out the blisters. Make sure you dry your feet thoroughly afterward. SEEK MEDICAL CARE IF:   You have a fever.  You have swelling, soreness, warmth, or redness in your foot.  You are not getting better after 7 days of treatment.  You are not completely cured after 30 days.  You have any problems caused by your medicines. MAKE SURE YOU:   Understand these instructions.  Will watch your condition.  Will get help right away if you are not doing well or get worse. Document Released: 12/10/2000 Document Revised: 03/06/2012 Document Reviewed: 10/01/2011 [facility] Patient Information 2015 [place], [place]. This information is not intended to  replace advice given to you by your health care provider. Make sure you discuss any questions you have with your health care provider. Diabetes and Foot Care Diabetes may cause you to have problems because of poor blood supply (circulation) to your feet and legs. This may cause the skin on your feet to become thinner, break easier, and heal more slowly. Your skin may become dry, and the skin may peel and crack. You may also have nerve damage in your legs and feet causing decreased feeling in them. You may not notice minor injuries to your feet that could lead to infections or more serious problems. Taking care of your feet is one of the most important things you can do for yourself.  HOME CARE INSTRUCTIONS  Wear shoes at all times, even in the house. Do not go barefoot. Bare feet are easily injured.  Check your feet daily for blisters, cuts, and redness. If you cannot see the bottom of your  feet, use a mirror or ask someone for help.  Wash your feet with warm water (do not use hot water) and mild soap. Then pat your feet and the areas between your toes until they are completely dry. Do not soak your feet as this can dry your skin.  Apply a moisturizing lotion or petroleum jelly (that does not contain alcohol and is unscented) to the skin on your feet and to dry, brittle toenails. Do not apply lotion between your toes.  Trim your toenails straight across. Do not dig under them or around the cuticle. File the edges of your nails with an emery board or nail file.  Do not cut corns or calluses or try to remove them with medicine.  Wear clean socks or stockings every day. Make sure they are not too tight. Do not wear knee-high stockings since they may decrease blood flow to your legs.  Wear shoes that fit properly and have enough cushioning. To break in new shoes, wear them for just a few hours a day. This prevents you from injuring your feet. Always look in your shoes before you put them on to be sure  there are no objects inside.  Do not cross your legs. This may decrease the blood flow to your feet.  If you find a minor scrape, cut, or break in the skin on your feet, keep it and the skin around it clean and dry. These areas may be cleansed with mild soap and water. Do not cleanse the area with peroxide, alcohol, or iodine.  When you remove an adhesive bandage, be sure not to damage the skin around it.  If you have a wound, look at it several times a day to make sure it is healing.  Do not use heating pads or hot water bottles. They may burn your skin. If you have lost feeling in your feet or legs, you may not know it is happening until it is too late.  Make sure your health care provider performs a complete foot exam at least annually or more often if you have foot problems. Report any cuts, sores, or bruises to your health care provider immediately. SEEK MEDICAL CARE IF:   You have an injury that is not healing.  You have cuts or breaks in the skin.  You have an ingrown nail.  You notice redness on your legs or feet.  You feel burning or tingling in your legs or feet.  You have pain or cramps in your legs and feet.  Your legs or feet are numb.  Your feet always feel cold. SEEK IMMEDIATE MEDICAL CARE IF:   There is increasing redness, swelling, or pain in or around a wound.  There is a red line that goes up your leg.  Pus is coming from a wound.  You develop a fever or as directed by your health care provider.  You notice a bad smell coming from an ulcer or wound. Document Released: 12/10/2000 Document Revised: 08/15/2013 Document Reviewed: 05/22/2013 [facility] Patient Information 2015 [place], [place]. This information is not intended to replace advice given to you by your health care provider. Make sure you discuss any questions you have with your health care provider.

## 2014-11-14 ENCOUNTER — Other Ambulatory Visit: Payer: Medicare Other

## 2014-11-14 DIAGNOSIS — E1129 Type 2 diabetes mellitus with other diabetic kidney complication: Secondary | ICD-10-CM

## 2014-11-15 LAB — COMPREHENSIVE METABOLIC PANEL
ALT: 11 IU/L (ref 0–32)
AST: 14 IU/L (ref 0–40)
Albumin/Globulin Ratio: 1.7 (ref 1.1–2.5)
Albumin: 4.1 g/dL (ref 3.5–4.8)
Alkaline Phosphatase: 77 IU/L (ref 39–117)
BUN/Creatinine Ratio: 14 (ref 11–26)
BUN: 17 mg/dL (ref 8–27)
CO2: 22 mmol/L (ref 18–29)
Calcium: 10.2 mg/dL (ref 8.7–10.3)
Chloride: 103 mmol/L (ref 97–108)
Creatinine, Ser: 1.18 mg/dL — ABNORMAL HIGH (ref 0.57–1.00)
GFR calc Af Amer: 51 mL/min/{1.73_m2} — ABNORMAL LOW (ref >59)
GFR calc non Af Amer: 45 mL/min/{1.73_m2} — ABNORMAL LOW (ref >59)
Globulin, Total: 2.4 g/dL (ref 1.5–4.5)
Glucose: 120 mg/dL — ABNORMAL HIGH (ref 65–99)
Potassium: 4.2 mmol/L (ref 3.5–5.2)
Sodium: 140 mmol/L (ref 134–144)
Total Bilirubin: 0.4 mg/dL (ref 0.0–1.2)
Total Protein: 6.5 g/dL (ref 6.0–8.5)

## 2014-11-15 LAB — HEMOGLOBIN A1C
Est. average glucose Bld gHb Est-mCnc: 163 mg/dL
Hgb A1c MFr Bld: 7.3 % — ABNORMAL HIGH (ref 4.8–5.6)

## 2014-11-18 ENCOUNTER — Encounter: Payer: Self-pay | Admitting: Pharmacotherapy

## 2014-11-18 ENCOUNTER — Ambulatory Visit (INDEPENDENT_AMBULATORY_CARE_PROVIDER_SITE_OTHER): Payer: Medicare Other | Admitting: Pharmacotherapy

## 2014-11-18 VITALS — BP 136/88 | HR 53 | Temp 97.2°F | Resp 10 | Ht 65.0 in | Wt 141.0 lb

## 2014-11-18 DIAGNOSIS — E1165 Type 2 diabetes mellitus with hyperglycemia: Secondary | ICD-10-CM

## 2014-11-18 DIAGNOSIS — I10 Essential (primary) hypertension: Secondary | ICD-10-CM

## 2014-11-18 DIAGNOSIS — IMO0002 Reserved for concepts with insufficient information to code with codable children: Secondary | ICD-10-CM

## 2014-11-18 DIAGNOSIS — E1129 Type 2 diabetes mellitus with other diabetic kidney complication: Secondary | ICD-10-CM

## 2014-11-18 DIAGNOSIS — N182 Chronic kidney disease, stage 2 (mild): Secondary | ICD-10-CM

## 2014-11-18 MED ORDER — EMPAGLIFLOZIN 10 MG PO TABS: 10.0000 mg | ORAL_TABLET | Freq: Every day | ORAL | Status: DC

## 2014-11-18 NOTE — Patient Instructions (Signed)
Start Jardiance 10 mg daily

## 2014-11-18 NOTE — Progress Notes (Signed)
  Subjective:    Monique Cabrera is a 79 y.o.African American female who presents for follow-up of Type 2 diabetes mellitus.   A1C much improved. She thinks her BG are better than before. She forgot to bring her blood glucose meter.  Fasting BG 120's Lowest BG:  65mg /dl Never >200.  She stopped taking Invokana last week. About 2 weeks ago she had another vaginal yeast infection. Yeast infection now resolved.  Eating healthier. Skips dinner at times. No routine exercise. She is working again.  Teacher's assistant for 4 classrooms. Denies problems with feet.  She did see a podiatrist.  Performed toenail care. No peripheral edema. Wears corrective lenses. Nocturia once per night.  Review of Systems A comprehensive review of systems was negative except for: Eyes: positive for contacts/glasses Genitourinary: positive for vaginal discharge and nocturia fungal toenail    Objective:    BP 136/88 mmHg  Pulse 53  Temp(Src) 97.2 F (36.2 C) (Oral)  Resp 10  Ht 5\' 5"  (1.651 m)  Wt 141 lb (63.957 kg)  BMI 23.46 kg/m2  SpO2 99%  General:  alert, cooperative and no distress  Oropharynx: normal findings: lips normal without lesions and gums healthy   Eyes:  negative findings: lids and lashes normal and conjunctivae and sclerae normal   Ears:  external ears normal        Lung: clear to auscultation bilaterally  Heart:  regular rate and rhythm     Extremities: no edema noted  Skin: fungal toenail     Neuro: mental status, speech normal, alert and oriented x3 and gait and station normal   Lab Review GLUCOSE (mg/dL)  Date Value  11/14/2014 120*  08/19/2014 156*  05/09/2014 142*   GLUCOSE, BLD (mg/dL)  Date Value  07/30/2014 345*  08/21/2012 131*   CO2  Date Value  11/14/2014 22 mmol/L  08/19/2014 23 mmol/L  07/30/2014 21 mEq/L   BUN (mg/dL)  Date Value  11/14/2014 17  08/19/2014 21  07/30/2014 14  05/09/2014 22  08/21/2012 19   CREATININE, SER (mg/dL)   Date Value  11/14/2014 1.18*  08/19/2014 1.31*  07/30/2014 1.02    11/14/14: A1C:  7.3%  Assessment:    Diabetes Mellitus type II, under good control. A1C much improved. BP at goal <140/90   Plan:    1.  Rx changes: change Invokana to Jardiance 10mg  daily to see if she tolertaes different agent in SGLT-2 class. 2.  Continue Lantus 46 units daily. 3. Continue Humalog 5 units with meals. 4.  Continue Metformin 1000mg  twice daily. 5.  Counseled on nutrition goals. 6.  Exercise goal is 30-45 minutes 5 x week. 7.  BP at goal.  Continue enalapril, valsartan HCT, amlodipine, carvedilol. 8.  RTC in 3 months.

## 2014-12-23 ENCOUNTER — Encounter: Payer: Self-pay | Admitting: Internal Medicine

## 2014-12-23 ENCOUNTER — Ambulatory Visit: Payer: Medicare Other | Admitting: Internal Medicine

## 2014-12-23 DIAGNOSIS — Z0289 Encounter for other administrative examinations: Secondary | ICD-10-CM

## 2014-12-30 ENCOUNTER — Encounter: Payer: Self-pay | Admitting: Internal Medicine

## 2015-01-03 ENCOUNTER — Ambulatory Visit (INDEPENDENT_AMBULATORY_CARE_PROVIDER_SITE_OTHER): Payer: 59 | Admitting: Podiatry

## 2015-01-03 DIAGNOSIS — M79676 Pain in unspecified toe(s): Secondary | ICD-10-CM

## 2015-01-03 DIAGNOSIS — B351 Tinea unguium: Secondary | ICD-10-CM | POA: Diagnosis not present

## 2015-01-03 NOTE — Patient Instructions (Signed)
Diabetes and Foot Care Diabetes may cause you to have problems because of poor blood supply (circulation) to your feet and legs. This may cause the skin on your feet to become thinner, break easier, and heal more slowly. Your skin may become dry, and the skin may peel and crack. You may also have nerve damage in your legs and feet causing decreased feeling in them. You may not notice minor injuries to your feet that could lead to infections or more serious problems. Taking care of your feet is one of the most important things you can do for yourself.  HOME CARE INSTRUCTIONS  Wear shoes at all times, even in the house. Do not go barefoot. Bare feet are easily injured.  Check your feet daily for blisters, cuts, and redness. If you cannot see the bottom of your feet, use a mirror or ask someone for help.  Wash your feet with warm water (do not use hot water) and mild soap. Then pat your feet and the areas between your toes until they are completely dry. Do not soak your feet as this can dry your skin.  Apply a moisturizing lotion or petroleum jelly (that does not contain alcohol and is unscented) to the skin on your feet and to dry, brittle toenails. Do not apply lotion between your toes.  Trim your toenails straight across. Do not dig under them or around the cuticle. File the edges of your nails with an emery board or nail file.  Do not cut corns or calluses or try to remove them with medicine.  Wear clean socks or stockings every day. Make sure they are not too tight. Do not wear knee-high stockings since they may decrease blood flow to your legs.  Wear shoes that fit properly and have enough cushioning. To break in new shoes, wear them for just a few hours a day. This prevents you from injuring your feet. Always look in your shoes before you put them on to be sure there are no objects inside.  Do not cross your legs. This may decrease the blood flow to your feet.  If you find a minor scrape,  cut, or break in the skin on your feet, keep it and the skin around it clean and dry. These areas may be cleansed with mild soap and water. Do not cleanse the area with peroxide, alcohol, or iodine.  When you remove an adhesive bandage, be sure not to damage the skin around it.  If you have a wound, look at it several times a day to make sure it is healing.  Do not use heating pads or hot water bottles. They may burn your skin. If you have lost feeling in your feet or legs, you may not know it is happening until it is too late.  Make sure your health care provider performs a complete foot exam at least annually or more often if you have foot problems. Report any cuts, sores, or bruises to your health care provider immediately. SEEK MEDICAL CARE IF:   You have an injury that is not healing.  You have cuts or breaks in the skin.  You have an ingrown nail.  You notice redness on your legs or feet.  You feel burning or tingling in your legs or feet.  You have pain or cramps in your legs and feet.  Your legs or feet are numb.  Your feet always feel cold. SEEK IMMEDIATE MEDICAL CARE IF:   There is increasing redness,   swelling, or pain in or around a wound.  There is a red line that goes up your leg.  Pus is coming from a wound.  You develop a fever or as directed by your health care provider.  You notice a bad smell coming from an ulcer or wound. Document Released: 12/10/2000 Document Revised: 08/15/2013 Document Reviewed: 05/22/2013 ExitCare Patient Information 2015 [name]. This information is not intended to replace advice given to you by your health care provider. Make sure you discuss any questions you have with your health care provider.  

## 2015-01-06 ENCOUNTER — Other Ambulatory Visit: Payer: Self-pay | Admitting: Internal Medicine

## 2015-01-06 NOTE — Progress Notes (Signed)
Patient ID: Monique Cabrera, female   DOB: 1935-05-28, 80 y.o.   MRN: [id number]  Subjective: 80 year old female presents the fifth of diabetic risk assessment and for painful elongated toenails. She says her nails are painful with pressure to all of her nails. She has a states of the right hallux nail did split since last appointment. She denies any recent redness or drainage from the nail sites. Her last HbA1c check on November 19 was 7.3. Denies any recent ulceration to the area. No other complaints at this time.  Objective: AAO 3, NAD DP/PT pulses palpable, CRT less than 3 seconds Decrease in protective sensation with Simms Weinstein monofilament, decreased vibratory sensation. Achilles tendon reflex appears to be intact. Nails hypertrophic, dystrophic, elongated, brittle, discolored 10. No surrounding erythema or drainage from the nail sites. There continues to be some dry, scaly, pealing skin on the plantar aspect of feet with no underlying erythema. No open lesions or pre-ulcer lesions identified at this time. No interdigital maceration.  No overlying edema, erythema, increase in warmth to bilateral lower x-rays. There is no tenderness to palpation to bilateral lower x-rays. MMT 5/5, ROM WNL No pain with calf compression, swelling, warmth, erythema.  Assessment: 80 year old female with symptomatic onychomycosis  Plan: -Various treatment options were discussed the patient including alternatives, risks, complications. -Nails were sharply debrided 10 without complication/bleeding to patient comfort. -At this time recommended moisturizer to the feet however do not apply interdigitally.   -Continue daily foot inspections. -Follow-up in 3 months or sooner should any palms arise. In the meantime, encouraged call the office for any questions, concerns, change in symptoms.

## 2015-02-13 ENCOUNTER — Other Ambulatory Visit: Payer: Medicare Other

## 2015-02-14 ENCOUNTER — Other Ambulatory Visit: Payer: 59

## 2015-02-14 DIAGNOSIS — IMO0002 Reserved for concepts with insufficient information to code with codable children: Secondary | ICD-10-CM

## 2015-02-14 DIAGNOSIS — E1129 Type 2 diabetes mellitus with other diabetic kidney complication: Secondary | ICD-10-CM | POA: Diagnosis not present

## 2015-02-14 DIAGNOSIS — E1165 Type 2 diabetes mellitus with hyperglycemia: Secondary | ICD-10-CM | POA: Diagnosis not present

## 2015-02-15 LAB — LIPID PANEL
Chol/HDL Ratio: 2.7 ratio units (ref 0.0–4.4)
Cholesterol, Total: 205 mg/dL — ABNORMAL HIGH (ref 100–199)
HDL: 77 mg/dL (ref >39)
LDL Calculated: 110 mg/dL — ABNORMAL HIGH (ref 0–99)
Triglycerides: 88 mg/dL (ref 0–149)
VLDL Cholesterol Cal: 18 mg/dL (ref 5–40)

## 2015-02-15 LAB — COMPREHENSIVE METABOLIC PANEL
ALT: 19 IU/L (ref 0–32)
AST: 24 IU/L (ref 0–40)
Albumin/Globulin Ratio: 1.5 (ref 1.1–2.5)
Albumin: 3.8 g/dL (ref 3.5–4.8)
Alkaline Phosphatase: 90 IU/L (ref 39–117)
BUN/Creatinine Ratio: 15 (ref 11–26)
BUN: 18 mg/dL (ref 8–27)
Bilirubin Total: 0.4 mg/dL (ref 0.0–1.2)
CO2: 18 mmol/L (ref 18–29)
Calcium: 9.4 mg/dL (ref 8.7–10.3)
Chloride: 98 mmol/L (ref 97–108)
Creatinine, Ser: 1.2 mg/dL — ABNORMAL HIGH (ref 0.57–1.00)
GFR calc Af Amer: 50 mL/min/{1.73_m2} — ABNORMAL LOW (ref >59)
GFR calc non Af Amer: 44 mL/min/{1.73_m2} — ABNORMAL LOW (ref >59)
Globulin, Total: 2.5 g/dL (ref 1.5–4.5)
Glucose: 292 mg/dL — ABNORMAL HIGH (ref 65–99)
Potassium: 4.4 mmol/L (ref 3.5–5.2)
Sodium: 134 mmol/L (ref 134–144)
Total Protein: 6.3 g/dL (ref 6.0–8.5)

## 2015-02-15 LAB — MICROALBUMIN / CREATININE URINE RATIO
Creatinine, Urine: 91.8 mg/dL (ref 15.0–278.0)
MICROALB/CREAT RATIO: 2163.3 mg/g{creat} — ABNORMAL HIGH (ref 0.0–30.0)
Microalbumin, Urine: 1985.9 ug/mL — ABNORMAL HIGH (ref 0.0–17.0)

## 2015-02-15 LAB — HEMOGLOBIN A1C
Est. average glucose Bld gHb Est-mCnc: 200 mg/dL
Hgb A1c MFr Bld: 8.6 % — ABNORMAL HIGH (ref 4.8–5.6)

## 2015-02-17 ENCOUNTER — Ambulatory Visit (INDEPENDENT_AMBULATORY_CARE_PROVIDER_SITE_OTHER): Payer: 59 | Admitting: Pharmacotherapy

## 2015-02-17 ENCOUNTER — Encounter: Payer: Self-pay | Admitting: Pharmacotherapy

## 2015-02-17 VITALS — BP 136/72 | HR 62 | Temp 98.1°F | Resp 18 | Ht 65.0 in | Wt 141.4 lb

## 2015-02-17 DIAGNOSIS — R809 Proteinuria, unspecified: Secondary | ICD-10-CM

## 2015-02-17 DIAGNOSIS — E119 Type 2 diabetes mellitus without complications: Secondary | ICD-10-CM | POA: Diagnosis not present

## 2015-02-17 DIAGNOSIS — N182 Chronic kidney disease, stage 2 (mild): Secondary | ICD-10-CM | POA: Diagnosis not present

## 2015-02-17 DIAGNOSIS — E1129 Type 2 diabetes mellitus with other diabetic kidney complication: Secondary | ICD-10-CM | POA: Diagnosis not present

## 2015-02-17 DIAGNOSIS — E1165 Type 2 diabetes mellitus with hyperglycemia: Secondary | ICD-10-CM

## 2015-02-17 DIAGNOSIS — IMO0002 Reserved for concepts with insufficient information to code with codable children: Secondary | ICD-10-CM

## 2015-02-17 DIAGNOSIS — I10 Essential (primary) hypertension: Secondary | ICD-10-CM

## 2015-02-17 NOTE — Patient Instructions (Signed)
Increase Lantus to 50 units every day Take your Humalog 5 units with each meal

## 2015-02-17 NOTE — Progress Notes (Signed)
  Subjective:    Monique Cabrera is a 80 y.o.African American female who presents for follow-up of Type 2 diabetes mellitus.   A1C is higher - now at 8.6% Microalbumin >1000 Stopped taking Jardiance due to itching.  She has had nausea, diarrhea, abdominal pain since Friday.  Feeling better today. She is not drinking a lot. She is a little dehydrated.  She is currently taking Lantus 50 units daily Not taking Humalog with meals. Not making healthy food choices. No routine exercise. Denies problems with feet.  Goes to podiatrist for foot care. No peripheral edema. Wearing corrective lenses. She does take her metformin. Nocturia twice per night.   Review of Systems A comprehensive review of systems was negative except for: Constitutional: positive for fatigue Eyes: positive for contacts/glasses Gastrointestinal: positive for change in bowel habits and diarrhea Genitourinary: positive for nocturia    Objective:    BP 136/72 mmHg  Pulse 62  Temp(Src) 98.1 F (36.7 C) (Oral)  Resp 18  Ht 5\' 5"  (1.651 m)  Wt 141 lb 6.4 oz (64.139 kg)  BMI 23.53 kg/m2  SpO2 97%  General:  alert, cooperative and fatigued  Oropharynx: normal findings: lips normal without lesions and gums healthy   Eyes:  negative findings: lids and lashes normal and corneas clear   Ears:  external ears are OK        Lung: clear to auscultation bilaterally  Heart:  regular rate and rhythm     Extremities: no edema, redness or tenderness in the calves or thighs  Skin: dry     Neuro: mental status, speech normal, alert and oriented x3 and gait and station normal   Lab Review GLUCOSE (mg/dL)  Date Value  02/14/2015 292*  11/14/2014 120*  08/19/2014 156*   GLUCOSE, BLD (mg/dL)  Date Value  07/30/2014 345*  08/21/2012 131*   CO2 (mmol/L)  Date Value  02/14/2015 18  11/14/2014 22  08/19/2014 23   BUN (mg/dL)  Date Value  02/14/2015 18  11/14/2014 17  08/19/2014 21  07/30/2014 14   08/21/2012 19   CREATININE, SER (mg/dL)  Date Value  02/14/2015 1.20*  11/14/2014 1.18*  08/19/2014 1.31*      A1C 8.6% microalbumin >1900 LDL:  110   Assessment:    Diabetes Mellitus type II, under poor control.   BP at goal <140/90 LDL above target of <100 Microalbumin very high   Plan:    1.  Rx changes: increase Lantus 50 units daily.  MUST take Humalog 5 units with each meal.   2.  Continue Metformin 3.  Counseled at length on complications of uncontrolled DM. 4.  Counseled on nutrition goals. 5.  Needs routine exercise.  Goal is 30-45 minutes 5 x week. 6.  HTN at goal. 7.  LDL now >100.  Improved nutrition and exercise should help.  On lovastatin. 8.  Microalbumin very high.  Refer to nephrology per Dr. Mariea Clonts.

## 2015-02-21 ENCOUNTER — Emergency Department (HOSPITAL_COMMUNITY)
Admission: EM | Admit: 2015-02-21 | Discharge: 2015-02-21 | Disposition: A | Discharge status: Home / Self Care | Payer: Medicaid Other | Attending: Emergency Medicine | Admitting: Emergency Medicine

## 2015-02-21 ENCOUNTER — Encounter (HOSPITAL_COMMUNITY): Payer: Self-pay | Admitting: Emergency Medicine

## 2015-02-21 DIAGNOSIS — Z79899 Other long term (current) drug therapy: Secondary | ICD-10-CM | POA: Diagnosis not present

## 2015-02-21 DIAGNOSIS — N182 Chronic kidney disease, stage 2 (mild): Secondary | ICD-10-CM | POA: Diagnosis not present

## 2015-02-21 DIAGNOSIS — E11649 Type 2 diabetes mellitus with hypoglycemia without coma: Secondary | ICD-10-CM | POA: Insufficient documentation

## 2015-02-21 DIAGNOSIS — E1129 Type 2 diabetes mellitus with other diabetic kidney complication: Secondary | ICD-10-CM | POA: Insufficient documentation

## 2015-02-21 DIAGNOSIS — Z794 Long term (current) use of insulin: Secondary | ICD-10-CM | POA: Insufficient documentation

## 2015-02-21 DIAGNOSIS — E161 Other hypoglycemia: Secondary | ICD-10-CM | POA: Diagnosis not present

## 2015-02-21 DIAGNOSIS — Z862 Personal history of diseases of the blood and blood-forming organs and certain disorders involving the immune mechanism: Secondary | ICD-10-CM | POA: Insufficient documentation

## 2015-02-21 DIAGNOSIS — E785 Hyperlipidemia, unspecified: Secondary | ICD-10-CM | POA: Diagnosis not present

## 2015-02-21 DIAGNOSIS — Z7902 Long term (current) use of antithrombotics/antiplatelets: Secondary | ICD-10-CM | POA: Diagnosis not present

## 2015-02-21 DIAGNOSIS — Z8619 Personal history of other infectious and parasitic diseases: Secondary | ICD-10-CM | POA: Diagnosis not present

## 2015-02-21 DIAGNOSIS — Z87891 Personal history of nicotine dependence: Secondary | ICD-10-CM | POA: Insufficient documentation

## 2015-02-21 DIAGNOSIS — I129 Hypertensive chronic kidney disease with stage 1 through stage 4 chronic kidney disease, or unspecified chronic kidney disease: Secondary | ICD-10-CM | POA: Insufficient documentation

## 2015-02-21 DIAGNOSIS — E162 Hypoglycemia, unspecified: Secondary | ICD-10-CM

## 2015-02-21 DIAGNOSIS — R7309 Other abnormal glucose: Secondary | ICD-10-CM | POA: Diagnosis not present

## 2015-02-21 LAB — CBC WITH DIFFERENTIAL/PLATELET
BASOS ABS: 0 10*3/uL (ref 0.0–0.1)
Basophils Relative: 0 % (ref 0–1)
EOS PCT: 2 % (ref 0–5)
Eosinophils Absolute: 0.1 10*3/uL (ref 0.0–0.7)
HCT: 38.9 % (ref 36.0–46.0)
Hemoglobin: 13.6 g/dL (ref 12.0–15.0)
LYMPHS ABS: 2.5 10*3/uL (ref 0.7–4.0)
Lymphocytes Relative: 27 % (ref 12–46)
MCH: 31.3 pg (ref 26.0–34.0)
MCHC: 35 g/dL (ref 30.0–36.0)
MCV: 89.6 fL (ref 78.0–100.0)
MONO ABS: 0.7 10*3/uL (ref 0.1–1.0)
Monocytes Relative: 8 % (ref 3–12)
NEUTROS ABS: 5.8 10*3/uL (ref 1.7–7.7)
Neutrophils Relative %: 63 % (ref 43–77)
Platelets: 271 10*3/uL (ref 150–400)
RBC: 4.34 MIL/uL (ref 3.87–5.11)
RDW: 12.3 % (ref 11.5–15.5)
WBC: 9.1 10*3/uL (ref 4.0–10.5)

## 2015-02-21 LAB — COMPREHENSIVE METABOLIC PANEL
ALBUMIN: 3.7 g/dL (ref 3.5–5.2)
ALT: 16 U/L (ref 0–35)
ANION GAP: 8 (ref 5–15)
AST: 22 U/L (ref 0–37)
Alkaline Phosphatase: 84 U/L (ref 39–117)
BILIRUBIN TOTAL: 0.4 mg/dL (ref 0.3–1.2)
BUN: 15 mg/dL (ref 6–23)
CHLORIDE: 105 mmol/L (ref 96–112)
CO2: 25 mmol/L (ref 19–32)
Calcium: 10.3 mg/dL (ref 8.4–10.5)
Creatinine, Ser: 1.15 mg/dL — ABNORMAL HIGH (ref 0.50–1.10)
GFR calc Af Amer: 52 mL/min — ABNORMAL LOW (ref >90)
GFR calc non Af Amer: 45 mL/min — ABNORMAL LOW (ref >90)
Glucose, Bld: 94 mg/dL (ref 70–99)
Potassium: 3.4 mmol/L — ABNORMAL LOW (ref 3.5–5.1)
Sodium: 138 mmol/L (ref 135–145)
Total Protein: 6.9 g/dL (ref 6.0–8.3)

## 2015-02-21 LAB — CBG MONITORING, ED
GLUCOSE-CAPILLARY: 44 mg/dL — AB (ref 70–99)
GLUCOSE-CAPILLARY: 54 mg/dL — AB (ref 70–99)
GLUCOSE-CAPILLARY: 91 mg/dL (ref 70–99)
Glucose-Capillary: 114 mg/dL — ABNORMAL HIGH (ref 70–99)

## 2015-02-21 NOTE — ED Notes (Signed)
CBG 44, juice and peanut butter with crackers given. We'll check again in 15 min.

## 2015-02-21 NOTE — Discharge Instructions (Signed)
Hypoglycemia °Hypoglycemia occurs when the glucose in your blood is too low. Glucose is a type of sugar that is your body's main energy source. Hormones, such as insulin and glucagon, control the level of glucose in the blood. Insulin lowers blood glucose and glucagon increases blood glucose. Having too much insulin in your blood stream, or not eating enough food containing sugar, can result in hypoglycemia. Hypoglycemia can happen to people with or without diabetes. It can develop quickly and can be a medical emergency.  °CAUSES  °· Missing or delaying meals. °· Not eating enough carbohydrates at meals. °· Taking too much diabetes medicine. °· Not timing your oral diabetes medicine or insulin doses with meals, snacks, and exercise. °· Nausea and vomiting. °· Certain medicines. °· Severe illnesses, such as hepatitis, kidney disorders, and certain eating disorders. °· Increased activity or exercise without eating something extra or adjusting medicines. °· Drinking too much alcohol. °· A nerve disorder that affects body functions like your heart rate, blood pressure, and digestion (autonomic neuropathy). °· A condition where the stomach muscles do not function properly (gastroparesis). Therefore, medicines and food may not absorb properly. °· Rarely, a tumor of the pancreas can produce too much insulin. °SYMPTOMS  °· Hunger. °· Sweating (diaphoresis). °· Change in body temperature. °· Shakiness. °· Headache. °· Anxiety. °· Lightheadedness. °· Irritability. °· Difficulty concentrating. °· Dry mouth. °· Tingling or numbness in the hands or feet. °· Restless sleep or sleep disturbances. °· Altered speech and coordination. °· Change in mental status. °· Seizures or prolonged convulsions. °· Combativeness. °· Drowsiness (lethargic). °· Weakness. °· Increased heart rate or palpitations. °· Confusion. °· Pale, gray skin color. °· Blurred or double vision. °· Fainting. °DIAGNOSIS  °A physical exam and medical history will be  performed. Your caregiver may make a diagnosis based on your symptoms. Blood tests and other lab tests may be performed to confirm a diagnosis. Once the diagnosis is made, your caregiver will see if your signs and symptoms go away once your blood glucose is raised.  °TREATMENT  °Usually, you can easily treat your hypoglycemia when you notice symptoms. °· Check your blood glucose. If it is less than 70 mg/dl, take one of the following:   °¨ 3-4 glucose tablets.   °¨ ½ cup juice.   °¨ ½ cup regular soda.   °¨ 1 cup skim milk.   °¨ ½-1 tube of glucose gel.   °¨ 5-6 hard candies.   °· Avoid high-fat drinks or food that may delay a rise in blood glucose levels. °· Do not take more than the recommended amount of sugary foods, drinks, gel, or tablets. Doing so will cause your blood glucose to go too high.   °· Wait 10-15 minutes and recheck your blood glucose. If it is still less than 70 mg/dl or below your target range, repeat treatment.   °· Eat a snack if it is more than 1 hour until your next meal.   °There may be a time when your blood glucose may go so low that you are unable to treat yourself at home when you start to notice symptoms. You may need someone to help you. You may even faint or be unable to swallow. If you cannot treat yourself, someone will need to bring you to the hospital.  °HOME CARE INSTRUCTIONS °· If you have diabetes, follow your diabetes management plan by: °¨ Taking your medicines as directed. °¨ Following your exercise plan. °¨ Following your meal plan. Do not skip meals. Eat on time. °¨ Testing your blood   glucose regularly. Check your blood glucose before and after exercise. If you exercise longer or different than usual, be sure to check blood glucose more frequently. °¨ Wearing your medical alert jewelry that says you have diabetes. °· Identify the cause of your hypoglycemia. Then, develop ways to prevent the recurrence of hypoglycemia. °· Do not take a hot bath or shower right after an  insulin shot. °· Always carry treatment with you. Glucose tablets are the easiest to carry. °· If you are going to drink alcohol, drink it only with meals. °· Tell friends or family members ways to keep you safe during a seizure. This may include removing hard or sharp objects from the area or turning you on your side. °· Maintain a healthy weight. °SEEK MEDICAL CARE IF:  °· You are having problems keeping your blood glucose in your target range. °· You are having frequent episodes of hypoglycemia. °· You feel you might be having side effects from your medicines. °· You are not sure why your blood glucose is dropping so low. °· You notice a change in vision or a new problem with your vision. °SEEK IMMEDIATE MEDICAL CARE IF:  °· Confusion develops. °· A change in mental status occurs. °· The inability to swallow develops. °· Fainting occurs. °Document Released: 12/13/2005 Document Revised: 12/18/2013 Document Reviewed: 04/10/2012 °ExitCare® Patient Information ©2015 [name]. This information is not intended to replace advice given to you by your health care provider. Make sure you discuss any questions you have with your health care provider. ° °

## 2015-02-21 NOTE — ED Provider Notes (Addendum)
CSN: [id number]     Arrival date & time 02/21/15  0045 History  This chart was scribed for Monique Morn, MD by Delphia Grates, ED Scribe. This patient was seen in room A13C/A13C and the patient's care was started at 12:58 AM.   Chief Complaint  Patient presents with  . Hypoglycemia    The history is provided by the patient and the EMS personnel. No language interpreter was used.     HPI Comments: Monique Cabrera is a 80 y.o. female, with history of DM, who presents to the Emergency Department for hypoglycemia. Per EMS, patient is from home and was found unresponsive by her son. Patient was last seen normal at approximately 2.5 hours ago at 2230, prior to going to bed. Upon EMS arrival, they report a CBG of 30 with a heart rate in the 40s that was improved with D50. EMS reports the patient's last dose of insulin was yesterday morning (February 19, 2015). Patient is alert and oriented at this time, but is unable to recall any precipitating factors. Patient states she last ate a salad approximately 5 hours ago, which is typical of her diet/appetite. Patient states she feels fine at present. She reports nausea, vomiting, an diarrhea last week, but none today. Patient was seen by PCP 4 days ago.   Past Medical History  Diagnosis Date  . Diabetes mellitus   . Hypertension   . Chest pain, unspecified   . Herpes zoster with other nervous system complications(053.19)   . Nonspecific reaction to tuberculin skin test without active tuberculosis(795.51)   . Acute upper respiratory infections of unspecified site   . Type II or unspecified type diabetes mellitus with renal manifestations, uncontrolled   . Hypertensive renal disease, benign   . Proteinuria   . Type II or unspecified type diabetes mellitus with renal manifestations, not stated as uncontrolled   . Anemia, unspecified   . Unspecified disorder of kidney and ureter   . Disorder of bone and cartilage, unspecified   . Pain in joint,  lower leg   . Diarrhea   . Hypercalcemia   . Other and unspecified hyperlipidemia   . Unspecified essential hypertension   . Atherosclerosis of native arteries of the extremities, unspecified   . DM (diabetes mellitus) type II controlled with renal manifestation   . Postherpetic neuralgia   . Nonspecific tuberculin test reaction   . Anemia   . Peripheral arterial disease   . Chronic kidney disease (CKD), stage II (mild)    Past Surgical History  Procedure Laterality Date  . Removal of cyst from hand    . Hysterectomy    . Tonsillectomy    . Removal of tumor from foot    . Incision and drainage Left 05/27/14    sebacous cyst, ear   Family History  Problem Relation Age of Onset  . Diabetes Mother   . Diabetes Father   . Diabetes Sister   . Diabetes Sister    History  Substance Use Topics  . Smoking status: Former Smoker    Types: Cigarettes  . Smokeless tobacco: Former Systems developer     Comment: Quit about age 60   . Alcohol Use: No   OB History    No data available     Review of Systems  A complete 10 system review of systems was obtained and all systems are negative except as noted in the HPI and PMH.    Allergies  Invokana and Jardiance  Home Medications  Prior to Admission medications   Medication Sig Start Date End Date Taking? Authorizing Provider  amLODipine (NORVASC) 10 MG tablet Take 10 mg by mouth daily. Take one tablet by mouth daily for high blood pressure 07/02/14   Tiffany L Reed, DO  B-D ULTRAFINE III SHORT PEN 31G X 8 MM MISC USE WITH LANTUS EVERY DAY FOR BLOOD SUGAR    Tiffany L Reed, DO  carvedilol (COREG) 25 MG tablet Take 1 tablet (25 mg total) by mouth 2 (two) times daily with a meal. 07/02/14   Tiffany L Reed, DO  clopidogrel (PLAVIX) 75 MG tablet Take 1 tablet (75 mg total) by mouth daily. 06/21/14   Tiffany L Reed, DO  enalapril (VASOTEC) 10 MG tablet Take 10 mg by mouth at bedtime. 07/02/14   Tiffany L Reed, DO  glucose blood (ONE TOUCH ULTRA TEST) test  strip Use as directed.  Dx: 250.40 08/19/14   Tivis Ringer, RPH-CPP  Insulin Glargine (LANTUS) 100 UNIT/ML Solostar Pen Inject 50 units under the skin daily for diabetes.  07/02/14   Tiffany L Reed, DO  insulin lispro (HUMALOG KWIKPEN) 100 UNIT/ML KiwkPen Inject 0.05 mLs (5 Units total) into the skin 3 (three) times daily with meals. 08/19/14   Tivis Ringer, RPH-CPP  lovastatin (MEVACOR) 20 MG tablet Take 20 mg by mouth at bedtime. Take one tablet by mouth every day for cholesterol 07/02/14   Tiffany L Reed, DO  metFORMIN (GLUCOPHAGE) 500 MG tablet Take 2 tablets (1,000 mg total) by mouth 2 (two) times daily with a meal. 07/02/14   Tiffany L Reed, DO  nitroGLYCERIN (NITROSTAT) 0.4 MG SL tablet Place 1 tablet (0.4 mg total) under the tongue every 5 (five) minutes as needed for chest pain. 08/19/14   Tivis Ringer, RPH-CPP   Triage Vitals: BP 135/44 mmHg  Pulse 57  SpO2 99%  Physical Exam  Constitutional: She is oriented to person, place, and time. She appears well-developed and well-nourished. No distress.  HENT:  Head: Normocephalic and atraumatic.  Eyes: EOM are normal.  Neck: Normal range of motion.  Cardiovascular: Normal rate, regular rhythm and normal heart sounds.   Pulmonary/Chest: Effort normal and breath sounds normal.  Abdominal: Soft. She exhibits no distension. There is no tenderness.  Musculoskeletal: Normal range of motion.  Neurological: She is alert and oriented to person, place, and time.  Skin: Skin is warm and dry.  Psychiatric: She has a normal mood and affect. Judgment normal.  Nursing note and vitals reviewed.   ED Course  Procedures (including critical care time)  DIAGNOSTIC STUDIES: Oxygen Saturation is 99% on room air, normal by my interpretation.    COORDINATION OF CARE: At 0102 Discussed treatment plan with patient. Patient agrees.    Labs Review Labs Reviewed  COMPREHENSIVE METABOLIC PANEL - Abnormal; Notable for the following:    Potassium 3.4 (*)     Creatinine, Ser 1.15 (*)    GFR calc non Af Amer 45 (*)    GFR calc Af Amer 52 (*)    All other components within normal limits  CBG MONITORING, ED - Abnormal; Notable for the following:    Glucose-Capillary 114 (*)    All other components within normal limits  CBC WITH DIFFERENTIAL/PLATELET    Imaging Review No results found.   EKG Interpretation None      MDM   Final diagnoses:  None    3:34 AM Patient feels much better at this time.  Discharge home in good condition.  She is  tolerating orals fluids and food at this time.  Primary care follow-up.  At this time I'll not make any changes to her diabetic regimen.  I will ask that she call her primary care physician in the morning. Her blood sugar did drop in the ER again, however this was taken prior to her eating. She only ate a salad for dinner. She reports she and her family are going to get pancakes at this time.   I personally performed the services described in this documentation, which was scribed in my presence. The recorded information has been reviewed and is accurate.      Monique Morn, MD 02/21/15 [place], MD 02/21/15 [phone]

## 2015-02-21 NOTE — ED Notes (Signed)
Pt found unresponsive by her son at home, CBG check by [facility] on arrival was 30, respiration 6/min, HR 44 carotic. One Amp of D50 given by [facility] and CBG back to 194. Pt denies any pain or discomfort, AO x 4, states last time seen well was at 2230 before going to bed.

## 2015-02-27 ENCOUNTER — Encounter: Payer: Self-pay | Admitting: Internal Medicine

## 2015-02-27 ENCOUNTER — Ambulatory Visit (INDEPENDENT_AMBULATORY_CARE_PROVIDER_SITE_OTHER): Payer: 59 | Admitting: Internal Medicine

## 2015-02-27 VITALS — BP 150/76 | HR 73 | Temp 98.3°F | Resp 18 | Ht 65.0 in | Wt 146.0 lb

## 2015-02-27 DIAGNOSIS — N182 Chronic kidney disease, stage 2 (mild): Secondary | ICD-10-CM | POA: Diagnosis not present

## 2015-02-27 DIAGNOSIS — E162 Hypoglycemia, unspecified: Secondary | ICD-10-CM | POA: Diagnosis not present

## 2015-02-27 DIAGNOSIS — E1129 Type 2 diabetes mellitus with other diabetic kidney complication: Secondary | ICD-10-CM | POA: Diagnosis not present

## 2015-02-27 DIAGNOSIS — IMO0002 Reserved for concepts with insufficient information to code with codable children: Secondary | ICD-10-CM

## 2015-02-27 DIAGNOSIS — E1165 Type 2 diabetes mellitus with hyperglycemia: Secondary | ICD-10-CM | POA: Diagnosis not present

## 2015-02-27 DIAGNOSIS — I10 Essential (primary) hypertension: Secondary | ICD-10-CM

## 2015-02-27 NOTE — Patient Instructions (Addendum)
Take a small snack before bedtime each night.  If your sugars are still staying low in the morning, call me back and we will change your insulin.  Bring your glucometer to all visits.  Bring your blood pressure cuff with you next time and the MAs can explain it to you.  Low Blood Sugar Low blood sugar (hypoglycemia) means that the level of sugar in your blood is lower than it should be. Signs of low blood sugar include:  Getting sweaty.  Feeling hungry.  Feeling dizzy or weak.  Feeling sleepier than normal.  Feeling nervous.  Headaches.  Having a fast heartbeat. Low blood sugar can happen fast and can be an emergency. Your doctor can do tests to check your blood sugar level. You can have low blood sugar and not have diabetes. HOME CARE  Check your blood sugar as told by your doctor. If it is less than 70 mg/dl or as told by your doctor, take 1 of the following:  3 to 4 glucose tablets.   cup clear juice.   cup soda pop, not diet.  1 cup milk.  5 to 6 hard candies.  Recheck blood sugar after 15 minutes. Repeat until it is at the right level.  Eat a snack if it is more than 1 hour until the next meal.  Only take medicine as told by your doctor.  Do not skip meals. Eat on time.  Do not drink alcohol except with meals.  Check your blood glucose before driving.  Check your blood glucose before and after exercise.  Always carry treatment with you, such as glucose pills.  Always wear a medical alert bracelet if you have diabetes. GET HELP RIGHT AWAY IF:   Your blood glucose goes below 70 mg/dl or as told by your doctor, and you:  Are confused.  Are not able to swallow.  Pass out (faint).  You cannot treat yourself. You may need someone to help you.  You have low blood sugar problems often.  You have problems from your medicines.  You are not feeling better after 3 to 4 days.  You have vision changes. MAKE SURE YOU:   Understand these  instructions.  Will watch this condition.  Will get help right away if you are not doing well or get worse. Document Released: 03/09/2010 Document Revised: 03/06/2012 Document Reviewed: 03/09/2010 [facility] Patient Information 2015 [place], [place]. This information is not intended to replace advice given to you by your health care provider. Make sure you discuss any questions you have with your health care provider.

## 2015-02-27 NOTE — Progress Notes (Signed)
Patient ID: Monique Cabrera, female   DOB: 1935-05-07, 80 y.o.   MRN: [id number]   Location:  [facility]  Allergies  Allergen Reactions  . Invokana [Canagliflozin]     Vaginal Itching and irritation   . Jardiance [Empagliflozin] Itching    Chief Complaint  Patient presents with  . Medical Management of Chronic Issues    low BS    HPI: Patient is a 80 y.o. black female seen in the office today for hypoglycemia.  She had ben having n/v/d, abdominal pain and dizziness when seen here by Tivis Ringer 02/17/15.  Her urine microalbumin was very high.  She was referred to nephrology.  She stopped jardiance due to itching. hba1c had improved to 8.6.    Feels the best she's felt in 2 weeks.   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    Did not bring glucometer.  Has been checking it in mornings when she first gets up and it's "very low" and she feels shaky--60, 50, 69.  Last night she took a snack before bed.  This am sugar was 127.  Has woken up sweaty.    She tells me her syncopal episode scared her enough that she is now complying with her diet.    Has nephrology appt 03/20/15.  Has a bp cuff but does not know how to read it.    Review of Systems:  Review of Systems  Constitutional: Positive for weight loss, malaise/fatigue and diaphoresis.  Eyes: Positive for blurred vision.  Respiratory: Negative for shortness of breath.   Cardiovascular: Negative for chest pain.  Gastrointestinal: Negative for nausea, vomiting, abdominal pain and diarrhea.  Genitourinary: Negative for dysuria, urgency and frequency.  Musculoskeletal: Negative for falls.  Neurological: Positive for dizziness, loss of consciousness and weakness.     Past Medical History  Diagnosis Date  . Diabetes mellitus   . Hypertension   . Chest pain, unspecified   . Herpes zoster with other nervous system complications(053.19)   . Nonspecific reaction to  tuberculin skin test without active tuberculosis(795.51)   . Acute upper respiratory infections of unspecified site   . Type II or unspecified type diabetes mellitus with renal manifestations, uncontrolled   . Hypertensive renal disease, benign   . Proteinuria   . Type II or unspecified type diabetes mellitus with renal manifestations, not stated as uncontrolled   . Anemia, unspecified   . Unspecified disorder of kidney and ureter   . Disorder of bone and cartilage, unspecified   . Pain in joint, lower leg   . Diarrhea   . Hypercalcemia   . Other and unspecified hyperlipidemia   . Unspecified essential hypertension   . Atherosclerosis of native arteries of the extremities, unspecified   . DM (diabetes mellitus) type II controlled with renal manifestation   . Postherpetic neuralgia   . Nonspecific tuberculin test reaction   . Anemia   . Peripheral arterial disease   . Chronic kidney disease (CKD), stage II (mild)     Past Surgical History  Procedure Laterality Date  . Removal of cyst from hand    . Hysterectomy    . Tonsillectomy    . Removal of tumor from foot    . Incision and drainage Left 05/27/14    sebacous cyst, ear    Social History:   reports that she has quit smoking. Her smoking use included Cigarettes. She has quit using smokeless tobacco. She reports that  she does not drink alcohol or use illicit drugs.  Family History  Problem Relation Age of Onset  . Diabetes Mother   . Diabetes Father   . Diabetes Sister   . Diabetes Sister     Medications: Patient's Medications  New Prescriptions   No medications on file  Previous Medications   AMLODIPINE (NORVASC) 10 MG TABLET    Take 10 mg by mouth daily. Take one tablet by mouth daily for high blood pressure   B-D ULTRAFINE III SHORT PEN 31G X 8 MM MISC    USE WITH LANTUS EVERY DAY FOR BLOOD SUGAR   CARVEDILOL (COREG) 25 MG TABLET    Take 1 tablet (25 mg total) by mouth 2 (two) times daily with a meal.    CLOPIDOGREL (PLAVIX) 75 MG TABLET    Take 1 tablet (75 mg total) by mouth daily.   ENALAPRIL (VASOTEC) 10 MG TABLET    Take 10 mg by mouth at bedtime.   GLUCOSE BLOOD (ONE TOUCH ULTRA TEST) TEST STRIP    Use as directed.  Dx: 250.40   INSULIN GLARGINE (LANTUS) 100 UNIT/ML SOLOSTAR PEN    Inject 50 units under the skin daily for diabetes.    INSULIN LISPRO (HUMALOG KWIKPEN) 100 UNIT/ML KIWKPEN    Inject 0.05 mLs (5 Units total) into the skin 3 (three) times daily with meals.   LOVASTATIN (MEVACOR) 20 MG TABLET    Take 20 mg by mouth at bedtime. Take one tablet by mouth every day for cholesterol   METFORMIN (GLUCOPHAGE) 500 MG TABLET    Take 2 tablets (1,000 mg total) by mouth 2 (two) times daily with a meal.   NITROGLYCERIN (NITROSTAT) 0.4 MG SL TABLET    Place 1 tablet (0.4 mg total) under the tongue every 5 (five) minutes as needed for chest pain.  Modified Medications   No medications on file  Discontinued Medications   No medications on file     Physical Exam: Filed Vitals:   02/27/15 1521  BP: 150/76  Pulse: 73  Temp: 98.3 F (36.8 C)  TempSrc: Oral  Resp: 18  Height: 5\' 5"  (1.651 m)  Weight: 146 lb (66.225 kg)  SpO2: 97%  Physical Exam  Constitutional: She is oriented to person, place, and time.  Cardiovascular: Normal rate, regular rhythm, normal heart sounds and intact distal pulses.   Pulmonary/Chest: Effort normal and breath sounds normal.  Musculoskeletal: Normal range of motion.  Neurological: She is alert and oriented to person, place, and time.    Labs reviewed: Basic Metabolic Panel:  Recent Labs  11/14/14 0911 02/14/15 1021 02/21/15 0127  NA 140 134 138  K 4.2 4.4 3.4*  CL 103 98 105  CO2 22 18 25   GLUCOSE 120* 292* 94  BUN 17 18 15   CREATININE 1.18* 1.20* 1.15*  CALCIUM 10.2 9.4 10.3   Liver Function Tests:  Recent Labs  07/30/14 2000  11/14/14 0911 02/14/15 1021 02/21/15 0127  AST 14  < > 14 24 22   ALT 12  < > 11 19 16   ALKPHOS 90  < > 77  90 84  BILITOT 0.2*  < > 0.4 0.4 0.4  PROT 6.7  < > 6.5 6.3 6.9  ALBUMIN 3.6  --   --   --  3.7  < > = values in this interval not displayed. No results for input(s): LIPASE, AMYLASE in the last 8760 hours. No results for input(s): AMMONIA in the last 8760 hours. CBC:  Recent  Labs  07/30/14 2000 02/21/15 0127  WBC 5.3 9.1  NEUTROABS 3.0 5.8  HGB 11.3* 13.6  HCT 33.6* 38.9  MCV 90.1 89.6  PLT 192 271   Lipid Panel:  Recent Labs  05/09/14 0918 08/19/14 1001 02/14/15 1021  CHOL 182 171 205*  HDL 61 64 77  LDLCALC 103* 95 110*  TRIG 90 61 88  CHOLHDL 3.0 2.7 2.7   Lab Results  Component Value Date   HGBA1C 8.6* 02/14/2015    Assessment/Plan 1. Type II diabetes mellitus with renal manifestations, uncontrolled -cont lantus and humalog and metformin, but due to low glucose in early ams, take a bedtime snack as tried the previous night with benefit -cont to actually eat regular meals, walk for exercise, and take meds as prescribed  2. Chronic kidney disease (CKD), stage II (mild) -has been referred to nephrology due to proteinuria  3. Essential hypertension -cont vasotec and coreg and norvasc -bring bp cuff to next visit so we can explain it and calibrate it  4. Hypoglycemia -bedtime snack, to call me if still has lows  Labs/tests ordered:  No orders of the defined types were placed in this encounter.  labs already ordered by Tivis Ringer  Next appt:  3 mos  Jaclyn Carew L. Amonie Wisser, D.O. [facility] [address], [place] [address] Cell Phone (Mon-Fri 8am-5pm):  [phone] On Call:  [phone] & follow prompts after 5pm & weekends Office Phone:  [phone] Office Fax:  [phone]

## 2015-03-20 DIAGNOSIS — E1121 Type 2 diabetes mellitus with diabetic nephropathy: Secondary | ICD-10-CM | POA: Diagnosis not present

## 2015-03-20 DIAGNOSIS — N183 Chronic kidney disease, stage 3 (moderate): Secondary | ICD-10-CM | POA: Diagnosis not present

## 2015-03-20 DIAGNOSIS — I10 Essential (primary) hypertension: Secondary | ICD-10-CM | POA: Diagnosis not present

## 2015-03-24 ENCOUNTER — Other Ambulatory Visit: Payer: Self-pay | Admitting: Internal Medicine

## 2015-03-24 MED ORDER — ENALAPRIL MALEATE 20 MG PO TABS
10.0000 mg | ORAL_TABLET | Freq: Every day | ORAL | Status: DC
Start: 2015-03-24 — End: 2015-06-19

## 2015-04-04 ENCOUNTER — Ambulatory Visit (INDEPENDENT_AMBULATORY_CARE_PROVIDER_SITE_OTHER): Payer: Medicare Other | Admitting: Podiatry

## 2015-04-04 ENCOUNTER — Encounter: Payer: Self-pay | Admitting: Podiatry

## 2015-04-04 VITALS — BP 152/62 | HR 65 | Resp 18

## 2015-04-04 DIAGNOSIS — B351 Tinea unguium: Secondary | ICD-10-CM | POA: Diagnosis not present

## 2015-04-04 DIAGNOSIS — M79676 Pain in unspecified toe(s): Secondary | ICD-10-CM | POA: Diagnosis not present

## 2015-04-07 NOTE — Progress Notes (Signed)
Patient ID: Monique Cabrera, female   DOB: [birth year], 80 y.o.   MRN: [id number]  Subjective: 80 y.o.-year-old female returns the office today for painful, elongated, thickened toenails. Denies any redness or drainage around the nails. Denies any acute changes since last appointment and no new complaints today. Denies any systemic complaints such as fevers, chills, nausea, vomiting.   Objective: AAO 3, NAD DP/PT pulses palpable, CRT less than 3 seconds Protective sensation decreased with Simms Weinstein monofilament, Achilles tendon reflex intact.  Nails hypertrophic, dystrophic, elongated, brittle, discolored 10. There is tenderness overlying the nails 1-5 bilaterally. There is no surrounding erythema or drainage along the nail sites. No open lesions or pre-ulcerative lesions are identified. Bilateral HAV No other areas of tenderness bilateral lower extremities. No overlying edema, erythema, increased warmth. No pain with calf compression, swelling, warmth, erythema.  Assessment: Patient presents with symptomatic onychomycosis  Plan: -Treatment options including alternatives, risks, complications were discussed -Nails sharply debrided 10 without complication/bleeding. -Discussed daily foot inspection. If there are any changes, to call the office immediately.  -Follow-up in 3 months or sooner if any problems are to arise. In the meantime, encouraged to call the office with any questions, concerns, changes symptoms.

## 2015-05-08 ENCOUNTER — Other Ambulatory Visit: Payer: Medicare Other

## 2015-05-08 DIAGNOSIS — E1129 Type 2 diabetes mellitus with other diabetic kidney complication: Secondary | ICD-10-CM | POA: Diagnosis not present

## 2015-05-08 DIAGNOSIS — IMO0002 Reserved for concepts with insufficient information to code with codable children: Secondary | ICD-10-CM

## 2015-05-08 DIAGNOSIS — E1165 Type 2 diabetes mellitus with hyperglycemia: Principal | ICD-10-CM

## 2015-05-09 LAB — HEMOGLOBIN A1C
Est. average glucose Bld gHb Est-mCnc: 183 mg/dL
Hgb A1c MFr Bld: 8 % — ABNORMAL HIGH (ref 4.8–5.6)

## 2015-05-09 LAB — COMPREHENSIVE METABOLIC PANEL
ALT: 10 IU/L (ref 0–32)
AST: 16 IU/L (ref 0–40)
Albumin/Globulin Ratio: 1.5 (ref 1.1–2.5)
Albumin: 3.9 g/dL (ref 3.5–4.8)
Alkaline Phosphatase: 94 IU/L (ref 39–117)
BUN/Creatinine Ratio: 18 (ref 11–26)
BUN: 21 mg/dL (ref 8–27)
Bilirubin Total: 0.2 mg/dL (ref 0.0–1.2)
CO2: 24 mmol/L (ref 18–29)
Calcium: 10.2 mg/dL (ref 8.7–10.3)
Chloride: 100 mmol/L (ref 97–108)
Creatinine, Ser: 1.15 mg/dL — ABNORMAL HIGH (ref 0.57–1.00)
GFR calc Af Amer: 53 mL/min/{1.73_m2} — ABNORMAL LOW (ref >59)
GFR calc non Af Amer: 46 mL/min/{1.73_m2} — ABNORMAL LOW (ref >59)
Globulin, Total: 2.6 g/dL (ref 1.5–4.5)
Glucose: 185 mg/dL — ABNORMAL HIGH (ref 65–99)
Potassium: 4 mmol/L (ref 3.5–5.2)
Sodium: 139 mmol/L (ref 134–144)
Total Protein: 6.5 g/dL (ref 6.0–8.5)

## 2015-05-12 ENCOUNTER — Ambulatory Visit (INDEPENDENT_AMBULATORY_CARE_PROVIDER_SITE_OTHER): Payer: Medicare Other | Admitting: Pharmacotherapy

## 2015-05-12 ENCOUNTER — Encounter: Payer: Self-pay | Admitting: Pharmacotherapy

## 2015-05-12 VITALS — BP 140/80 | HR 70 | Temp 97.4°F | Resp 18 | Ht 65.0 in | Wt 146.0 lb

## 2015-05-12 DIAGNOSIS — E1165 Type 2 diabetes mellitus with hyperglycemia: Secondary | ICD-10-CM

## 2015-05-12 DIAGNOSIS — N182 Chronic kidney disease, stage 2 (mild): Secondary | ICD-10-CM | POA: Diagnosis not present

## 2015-05-12 DIAGNOSIS — E1129 Type 2 diabetes mellitus with other diabetic kidney complication: Secondary | ICD-10-CM | POA: Diagnosis not present

## 2015-05-12 DIAGNOSIS — IMO0002 Reserved for concepts with insufficient information to code with codable children: Secondary | ICD-10-CM

## 2015-05-12 DIAGNOSIS — I10 Essential (primary) hypertension: Secondary | ICD-10-CM | POA: Diagnosis not present

## 2015-05-12 NOTE — Progress Notes (Signed)
  Subjective:    Monique Cabrera is a 80 y.o.African American female who presents for follow-up of Type 2 diabetes mellitus.   A1C has improved from 8.6% to 8.0% She feels her BG are better.  She still is not consistent with taking her blood glucose.  Rare hypoglycemia - 3 times in the last 3 months.  Lowest BG was 33m/dl She tries to get fasting BG every morning. Self reports her BG is 90-120 in the morning.   Trying to eat healthy.  Rarely skips meals.  Uses Glucerna if she thinks she is going to miss a meal. No routine exercise. No peripheral edema. Denies problems with feet.  Sees podiatrist every 3 months. Wears glasses.  Eye exam is due.  She reports her vision is getting worse - cataracts. Nocturia once per night.  Skipping lunch time Humalog.    Review of Systems A comprehensive review of systems was negative except for: Eyes: positive for contacts/glasses eye exam due    Objective:    BP 140/80 mmHg  Pulse 70  Temp(Src) 97.4 F (36.3 C) (Oral)  Resp 18  Ht 5' 5"$  (1.651 m)  Wt 146 lb (66.225 kg)  BMI 24.30 kg/m2  SpO2 97%  General:  alert, cooperative and no distress  Oropharynx: normal findings: lips normal without lesions and gums healthy   Eyes:  negative findings: lids and lashes normal and conjunctivae and sclerae normal   Ears:  external ears normal        Lung: clear to auscultation bilaterally  Heart:  regular rate and rhythm     Extremities: no edema, redness or tenderness in the calves or thighs  Skin: warm and dry, no hyperpigmentation, vitiligo, or suspicious lesions     Neuro: mental status, speech normal, alert and oriented x3 and gait and station normal   Lab Review GLUCOSE (mg/dL)  Date Value  05/08/2015 185*  02/14/2015 292*  11/14/2014 120*   GLUCOSE, BLD (mg/dL)  Date Value  02/21/2015 94  07/30/2014 345*  08/21/2012 131*   CO2 (mmol/L)  Date Value  05/08/2015 24  02/21/2015 25  02/14/2015 18   BUN (mg/dL)  Date Value   05/08/2015 21  02/21/2015 15  02/14/2015 18  11/14/2014 17  07/30/2014 14  08/21/2012 19   CREATININE, SER (mg/dL)  Date Value  05/08/2015 1.15*  02/21/2015 1.15*  02/14/2015 1.20*       Assessment:    Diabetes Mellitus type II, under fair control. A1C improved to 8.0% BP at goal <140/90   Plan:    1.  Rx changes: Needs to take all doses of insulin as prescribed. 2.  Continue Lantus 50 units daily. 3.  Continue Metformin. 4.  Counseled on nutrition goals. 5.  Counseled on need for routine exercise.  Goal is 30-45 minutes 5 x week. 6.  HTN well controlled.

## 2015-05-12 NOTE — Patient Instructions (Signed)
Don't miss any doses of Humalog

## 2015-06-05 ENCOUNTER — Ambulatory Visit: Payer: 59 | Admitting: Internal Medicine

## 2015-06-11 ENCOUNTER — Other Ambulatory Visit: Payer: Self-pay | Admitting: Internal Medicine

## 2015-06-19 ENCOUNTER — Encounter: Payer: Self-pay | Admitting: Internal Medicine

## 2015-06-19 ENCOUNTER — Ambulatory Visit (INDEPENDENT_AMBULATORY_CARE_PROVIDER_SITE_OTHER): Payer: Medicare Other | Admitting: Internal Medicine

## 2015-06-19 VITALS — BP 136/76 | HR 59 | Temp 97.8°F | Resp 20 | Ht 65.0 in | Wt 146.4 lb

## 2015-06-19 DIAGNOSIS — E785 Hyperlipidemia, unspecified: Secondary | ICD-10-CM

## 2015-06-19 DIAGNOSIS — B182 Chronic viral hepatitis C: Secondary | ICD-10-CM | POA: Diagnosis not present

## 2015-06-19 DIAGNOSIS — E1129 Type 2 diabetes mellitus with other diabetic kidney complication: Secondary | ICD-10-CM

## 2015-06-19 DIAGNOSIS — E13319 Other specified diabetes mellitus with unspecified diabetic retinopathy without macular edema: Secondary | ICD-10-CM | POA: Diagnosis not present

## 2015-06-19 DIAGNOSIS — H35049 Retinal micro-aneurysms, unspecified, unspecified eye: Secondary | ICD-10-CM

## 2015-06-19 DIAGNOSIS — E1165 Type 2 diabetes mellitus with hyperglycemia: Secondary | ICD-10-CM

## 2015-06-19 DIAGNOSIS — N182 Chronic kidney disease, stage 2 (mild): Secondary | ICD-10-CM | POA: Diagnosis not present

## 2015-06-19 DIAGNOSIS — IMO0002 Reserved for concepts with insufficient information to code with codable children: Secondary | ICD-10-CM

## 2015-06-19 DIAGNOSIS — I10 Essential (primary) hypertension: Secondary | ICD-10-CM | POA: Diagnosis not present

## 2015-06-22 NOTE — Progress Notes (Signed)
Patient ID: Monique Cabrera, female   DOB: [birth year], 80 y.o.   MRN: [id number]   Location:  [facility] DOS:  06/19/15 but computer difficulty at time of appt  Code Status: full code Goals of Care: Advanced Directive information Does patient have an advance directive?: No, Would patient like information on creating an advanced directive?: Yes - Educational materials given   Chief Complaint  Patient presents with  . Medical Management of Chronic Issues    3 month follow-up, labs printed    HPI: Patient is a 80 y.o. black female seen in the office today for med mgt of chronic diseases.  She is historically a noncompliant diabetic.  She is doing a bit better recently, but still does not eat full meals.  Has breakfast, lunch, but snacks for dinner (sandwich or chicken nuggets, chips or pizza).  Discussed that these items have a lot of simple carbs and will cause her glucose to spike and drop and do not keep her insulin levels in her body steady either.  She does have a bedtime snack of an apple.  She says she has not had any lows but a few highs in the 200s.    She was previously referred for her cscope, but there was a billing issue.  Then she says she was told she wasn't due yet b/c her last one was 2007 so due in 2017.    She still has not gone to an ophthalmologist despite discussions for several years--she now says hers has retired so new referral will be placed.  Her blood pressure has been up due to stress in her family (could be the chips and pizza, of course, too).  Says a family member committed suicide.    Review of Systems:  Review of Systems  Constitutional: Negative for fever, chills and malaise/fatigue.  HENT: Negative for congestion.   Eyes: Positive for blurred vision.  Respiratory: Negative for shortness of breath.   Cardiovascular: Negative for chest pain.  Gastrointestinal: Negative for abdominal pain.  Genitourinary: Negative  for dysuria.  Musculoskeletal: Negative for falls.  Skin: Negative for rash.  Neurological: Negative for dizziness, weakness and headaches.  Psychiatric/Behavioral:       Mind clearer today as sugars improve    Past Medical History  Diagnosis Date  . Diabetes mellitus   . Hypertension   . Chest pain, unspecified   . Herpes zoster with other nervous system complications(053.19)   . Nonspecific reaction to tuberculin skin test without active tuberculosis(795.51)   . Acute upper respiratory infections of unspecified site   . Type II or unspecified type diabetes mellitus with renal manifestations, uncontrolled   . Hypertensive renal disease, benign   . Proteinuria   . Type II or unspecified type diabetes mellitus with renal manifestations, not stated as uncontrolled   . Anemia, unspecified   . Unspecified disorder of kidney and ureter   . Disorder of bone and cartilage, unspecified   . Pain in joint, lower leg   . Diarrhea   . Hypercalcemia   . Other and unspecified hyperlipidemia   . Unspecified essential hypertension   . Atherosclerosis of native arteries of the extremities, unspecified   . DM (diabetes mellitus) type II controlled with renal manifestation   . Postherpetic neuralgia   . Nonspecific tuberculin test reaction   . Anemia   . Peripheral arterial disease   . Chronic kidney disease (CKD), stage II (mild)     Past  Surgical History  Procedure Laterality Date  . Removal of cyst from hand    . Hysterectomy    . Tonsillectomy    . Removal of tumor from foot    . Incision and drainage Left 05/27/14    sebacous cyst, ear    Allergies  Allergen Reactions  . Invokana [Canagliflozin]     Vaginal Itching and irritation   . Jardiance [Empagliflozin] Itching   Medications: Patient's Medications  New Prescriptions   No medications on file  Previous Medications   AMLODIPINE (NORVASC) 10 MG TABLET    Take 10 mg by mouth daily. Take one tablet by mouth daily for high  blood pressure   B-D ULTRAFINE III SHORT PEN 31G X 8 MM MISC    USE WITH LANTUS EVERY DAY FOR BLOOD SUGAR   CARVEDILOL (COREG) 25 MG TABLET    Take 1 tablet (25 mg total) by mouth 2 (two) times daily with a meal.   CLOPIDOGREL (PLAVIX) 75 MG TABLET    Take 1 tablet (75 mg total) by mouth daily.   ENALAPRIL (VASOTEC) 20 MG TABLET    Take 20 mg by mouth at bedtime.   GLUCOSE BLOOD (ONE TOUCH ULTRA TEST) TEST STRIP    Use as directed.  Dx: 250.40   INSULIN GLARGINE (LANTUS) 100 UNIT/ML SOLOSTAR PEN    Inject 50 units under the skin daily for diabetes.    INSULIN LISPRO (HUMALOG KWIKPEN) 100 UNIT/ML KIWKPEN    Inject 0.05 mLs (5 Units total) into the skin 3 (three) times daily with meals.   LOVASTATIN (MEVACOR) 20 MG TABLET    Take 20 mg by mouth at bedtime. Take one tablet by mouth every day for cholesterol   METFORMIN (GLUCOPHAGE) 500 MG TABLET    TAKE 2 TABLETS (1,000 MG TOTAL) BY MOUTH 2 (TWO) TIMES DAILY WITH A MEAL.   NITROGLYCERIN (NITROSTAT) 0.4 MG SL TABLET    Place 1 tablet (0.4 mg total) under the tongue every 5 (five) minutes as needed for chest pain.   OMEPRAZOLE (PRILOSEC) 20 MG CAPSULE    Take 20 mg by mouth as needed.   VALSARTAN-HYDROCHLOROTHIAZIDE (DIOVAN-HCT) 320-25 MG PER TABLET      Modified Medications   No medications on file  Discontinued Medications   ENALAPRIL (VASOTEC) 20 MG TABLET    Take 0.5 tablets (10 mg total) by mouth at bedtime.   VALSARTAN-HYDROCHLOROTHIAZIDE (DIOVAN-HCT) 320-25 MG PER TABLET    TAKE 1 TABLET BY MOUTH DAILY.    Physical Exam: Filed Vitals:   06/19/15 1135  BP: 136/76  Pulse: 59  Temp: 97.8 F (36.6 C)  TempSrc: Oral  Resp: 20  Height: 5\' 5"  (1.651 m)  Weight: 146 lb 6.4 oz (66.407 kg)  SpO2: 98%   Physical Exam  Constitutional: She is oriented to person, place, and time. She appears well-nourished. No distress.  Cardiovascular: Normal rate, regular rhythm, normal heart sounds and intact distal pulses.   Pulmonary/Chest: Effort normal  and breath sounds normal. No respiratory distress.  Abdominal: Soft. Bowel sounds are normal. She exhibits no distension. There is no tenderness.  Musculoskeletal: Normal range of motion.  Neurological: She is alert and oriented to person, place, and time.  Psychiatric: She has a normal mood and affect.    Labs reviewed: Basic Metabolic Panel:  Recent Labs  02/14/15 1021 02/21/15 0127 05/08/15 0836  NA 134 138 139  K 4.4 3.4* 4.0  CL 98 105 100  CO2 18 25 24   GLUCOSE 292*  94 185*  BUN 18 15 21   CREATININE 1.20* 1.15* 1.15*  CALCIUM 9.4 10.3 10.2   Liver Function Tests:  Recent Labs  07/30/14 2000  02/14/15 1021 02/21/15 0127 05/08/15 0836  AST 14  < > 24 22 16   ALT 12  < > 19 16 10   ALKPHOS 90  < > 90 84 94  BILITOT 0.2*  < > 0.4 0.4 0.2  PROT 6.7  < > 6.3 6.9 6.5  ALBUMIN 3.6  --   --  3.7  --   < > = values in this interval not displayed. No results for input(s): LIPASE, AMYLASE in the last 8760 hours. No results for input(s): AMMONIA in the last 8760 hours. CBC:  Recent Labs  07/30/14 2000 02/21/15 0127  WBC 5.3 9.1  NEUTROABS 3.0 5.8  HGB 11.3* 13.6  HCT 33.6* 38.9  MCV 90.1 89.6  PLT 192 271   Lipid Panel:  Recent Labs  08/19/14 1001 02/14/15 1021  CHOL 171 205*  HDL 64 77  LDLCALC 95 110*  TRIG 61 88  CHOLHDL 2.7 2.7   Lab Results  Component Value Date   HGBA1C 8.0* 05/08/2015    Assessment/Plan 1. Type II diabetes mellitus with renal manifestations, uncontrolled - control is very gradually improving, but still not at goal -is following with Cathey and has improved her lifestyle some -again counseled about regular meals and discussed how simple carb snacks do not help her -cont metformin, lovastatin, lantus and humalog, enalapril, plavix -try to find alternative snacks to prevent glucose and insulin spikes  2. Chronic kidney disease (CKD), stage II (mild) -cont ace, avoid nsaids  3. Essential hypertension -bp less than 140/90,  but usually better than this--blames on stress, cont to monitor  4. Chronic hepatitis C without hepatic coma -has not been treated, I will refer her to the [facility] of Health next time for Hep A and B vaccines and further instruction on treatment options  5. Hyperlipidemia with target LDL less than 100 -cont statin therapy--last LDL still 110, should be ideally <70 with her diabetes, but I'd be thrilled if she reached less than 100 with her nonadherence  6. 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 Ambulatory referral to Ophthalmology    Referral Priority:  Routine    Referral Type:  Consultation    Referral Reason:  Specialty Services Required    Requested Specialty:  Ophthalmology    Number of Visits Requested:  1    Next appt:  4 mos (has labs before sees Cathey next) Cruzito Standre L. Jayleana Colberg, D.O. [facility] [address], [place] [address] Cell Phone (Mon-Fri 8am-5pm):  [phone] On Call:  [phone] & follow prompts after 5pm & weekends Office Phone:  [phone] Office Fax:  [phone]

## 2015-07-11 ENCOUNTER — Ambulatory Visit: Payer: Medicare Other | Admitting: Podiatry

## 2015-07-21 ENCOUNTER — Ambulatory Visit (INDEPENDENT_AMBULATORY_CARE_PROVIDER_SITE_OTHER): Payer: Medicare Other | Admitting: Podiatry

## 2015-07-21 ENCOUNTER — Encounter: Payer: Self-pay | Admitting: Podiatry

## 2015-07-21 DIAGNOSIS — M79676 Pain in unspecified toe(s): Secondary | ICD-10-CM

## 2015-07-21 DIAGNOSIS — B351 Tinea unguium: Secondary | ICD-10-CM

## 2015-07-21 NOTE — Progress Notes (Signed)
Patient ID: Monique Cabrera, female   DOB: [birth year], 80 y.o.   MRN: [id number]  Subjective: 80 y.o. returns the office today for painful, elongated, thickened toenails which she is unable to trim herself. Denies any redness or drainage around the nails. Denies any acute changes since last appointment and no new complaints today. Denies any systemic complaints such as fevers, chills, nausea, vomiting.   Objective: AAO 3, NAD DP/PT pulses palpable 1/4, CRT less than 3 seconds Protective sensation decreased with Simms Weinstein monofilament, Achilles tendon reflex intact.  Nails hypertrophic, dystrophic, elongated, brittle, discolored 10. There is tenderness overlying the nails 1-5 bilaterally. There is no surrounding erythema or drainage along the nail sites. No open lesions or pre-ulcerative lesions are identified. No other areas of tenderness bilateral lower extremities. No overlying edema, erythema, increased warmth. No pain with calf compression, swelling, warmth, erythema.  Assessment: Patient presents with symptomatic onychomycosis  Plan: -Treatment options including alternatives, risks, complications were discussed -Nails sharply debrided 10 without complication/bleeding. -Discussed daily foot inspection. If there are any changes, to call the office immediately.  -Follow-up in 3 months or sooner if any problems are to arise. In the meantime, encouraged to call the office with any questions, concerns, changes symptoms.   Celesta Gentile, DPM

## 2015-08-04 DIAGNOSIS — H25013 Cortical age-related cataract, bilateral: Secondary | ICD-10-CM | POA: Diagnosis not present

## 2015-08-04 DIAGNOSIS — E11351 Type 2 diabetes mellitus with proliferative diabetic retinopathy with macular edema: Secondary | ICD-10-CM | POA: Diagnosis not present

## 2015-08-04 DIAGNOSIS — H2513 Age-related nuclear cataract, bilateral: Secondary | ICD-10-CM | POA: Diagnosis not present

## 2015-08-04 DIAGNOSIS — Z794 Long term (current) use of insulin: Secondary | ICD-10-CM | POA: Diagnosis not present

## 2015-08-04 LAB — HM DIABETES EYE EXAM

## 2015-08-05 ENCOUNTER — Encounter: Payer: Self-pay | Admitting: *Deleted

## 2015-08-05 DIAGNOSIS — H2511 Age-related nuclear cataract, right eye: Secondary | ICD-10-CM | POA: Diagnosis not present

## 2015-08-05 DIAGNOSIS — E11359 Type 2 diabetes mellitus with proliferative diabetic retinopathy without macular edema: Secondary | ICD-10-CM | POA: Diagnosis not present

## 2015-08-05 DIAGNOSIS — E11349 Type 2 diabetes mellitus with severe nonproliferative diabetic retinopathy without macular edema: Secondary | ICD-10-CM | POA: Diagnosis not present

## 2015-08-05 DIAGNOSIS — H2512 Age-related nuclear cataract, left eye: Secondary | ICD-10-CM | POA: Diagnosis not present

## 2015-08-14 ENCOUNTER — Other Ambulatory Visit: Payer: Medicare Other

## 2015-08-14 DIAGNOSIS — E1165 Type 2 diabetes mellitus with hyperglycemia: Secondary | ICD-10-CM | POA: Diagnosis not present

## 2015-08-14 DIAGNOSIS — E1129 Type 2 diabetes mellitus with other diabetic kidney complication: Secondary | ICD-10-CM

## 2015-08-14 DIAGNOSIS — IMO0002 Reserved for concepts with insufficient information to code with codable children: Secondary | ICD-10-CM

## 2015-08-15 LAB — COMPREHENSIVE METABOLIC PANEL
ALT: 9 IU/L (ref 0–32)
AST: 15 IU/L (ref 0–40)
Albumin/Globulin Ratio: 1.5 (ref 1.1–2.5)
Albumin: 3.8 g/dL (ref 3.5–4.8)
Alkaline Phosphatase: 70 IU/L (ref 39–117)
BUN/Creatinine Ratio: 12 (ref 11–26)
BUN: 17 mg/dL (ref 8–27)
Bilirubin Total: 0.2 mg/dL (ref 0.0–1.2)
CO2: 24 mmol/L (ref 18–29)
Calcium: 9.8 mg/dL (ref 8.7–10.3)
Chloride: 103 mmol/L (ref 97–108)
Creatinine, Ser: 1.44 mg/dL — ABNORMAL HIGH (ref 0.57–1.00)
GFR calc Af Amer: 40 mL/min/{1.73_m2} — ABNORMAL LOW (ref >59)
GFR calc non Af Amer: 35 mL/min/{1.73_m2} — ABNORMAL LOW (ref >59)
Globulin, Total: 2.6 g/dL (ref 1.5–4.5)
Glucose: 109 mg/dL — ABNORMAL HIGH (ref 65–99)
Potassium: 4.1 mmol/L (ref 3.5–5.2)
Sodium: 141 mmol/L (ref 134–144)
Total Protein: 6.4 g/dL (ref 6.0–8.5)

## 2015-08-15 LAB — HEMOGLOBIN A1C
Est. average glucose Bld gHb Est-mCnc: 206 mg/dL
Hgb A1c MFr Bld: 8.8 % — ABNORMAL HIGH (ref 4.8–5.6)

## 2015-08-18 ENCOUNTER — Encounter: Payer: Self-pay | Admitting: Pharmacotherapy

## 2015-08-18 ENCOUNTER — Ambulatory Visit (INDEPENDENT_AMBULATORY_CARE_PROVIDER_SITE_OTHER): Payer: Medicare Other | Admitting: Pharmacotherapy

## 2015-08-18 VITALS — BP 140/78 | HR 83 | Temp 98.4°F | Resp 20 | Ht 65.0 in | Wt 148.0 lb

## 2015-08-18 DIAGNOSIS — E1136 Type 2 diabetes mellitus with diabetic cataract: Secondary | ICD-10-CM

## 2015-08-18 NOTE — Patient Instructions (Signed)
Lantus 54 units daily Do not miss any doses of Humalog - take 5 units with each meal

## 2015-08-18 NOTE — Progress Notes (Signed)
  Subjective:    Monique Cabrera is a 80 y.o.African American female who presents for follow-up of Type 2 diabetes mellitus.   A1C higher - now 8.8% (was 8.0%) She admits her BG are higher this summer. She has been eating junk food all day. No routine exercise. No peripheral edema Denies problems with feet. Had eye exam this month.  Dr Gershon Crane sent her to a retina specialist ([facility]).  Also has cataracts.  Legally blind in left eye. Nocturia 1-2 times per night.  Self reports BG in the 200's She is taking Lantus 54 units daily She is missing most doses of Humalog Denies missing metformin.  Review of Systems A comprehensive review of systems was negative except for: Eyes: positive for cataracts, contacts/glasses and legally blind in left eye Genitourinary: positive for nocturia Musculoskeletal: positive for muscular pain under left arm - occurs at night.  NO lumps in breast.  Non-radiating, just localized pain.    Objective:    BP 140/78 mmHg  Pulse 83  Temp(Src) 98.4 F (36.9 C) (Oral)  Resp 20  Ht 5' 5"$  (1.651 m)  Wt 148 lb (67.132 kg)  BMI 24.63 kg/m2  SpO2 99%  General:  alert, cooperative and no distress  Oropharynx: normal findings: lips normal without lesions and gums healthy   Eyes:  negative findings: lids and lashes normal and conjunctivae and sclerae normal   Ears:  external ears normal        Lung: clear to auscultation bilaterally  Heart:  regular rate and rhythm     Extremities: no edema, redness or tenderness in the calves or thighs  Skin: warm and dry, no hyperpigmentation, vitiligo, or suspicious lesions     Neuro: mental status, speech normal, alert and oriented x3 and gait and station normal   Lab Review GLUCOSE (mg/dL)  Date Value  08/14/2015 109*  05/08/2015 185*  02/14/2015 292*   GLUCOSE, BLD (mg/dL)  Date Value  02/21/2015 94  07/30/2014 345*  08/21/2012 131*   CO2 (mmol/L)  Date Value  08/14/2015 24  05/08/2015 24  02/21/2015  25   BUN (mg/dL)  Date Value  08/14/2015 17  05/08/2015 21  02/21/2015 15  02/14/2015 18  07/30/2014 14  08/21/2012 19   CREATININE, SER (mg/dL)  Date Value  08/14/2015 1.44*  05/08/2015 1.15*  02/21/2015 1.15*       Assessment:    Diabetes Mellitus type II, under poor control. A1C goal is <7% BP at goal <140/90 Left sided muscle pain   Plan:    1.  Rx changes: none 2.  Need to take Humalog with each meal - no missing doses. 3.  Increase Lantus 54 units daily. 4.  Continue Metformin. 5.  Counseled on nutrition goals 6.  Counseled on need for routine exercise.  Goal is 30-45 minutes 5 x week. 7.  BP at goal <140/90 8.  She is to try an Aleve (1) to see if pain improves.  Avoid sleeping on side.  She will follow up with Dr. Isaiah Blakes.

## 2015-08-24 ENCOUNTER — Emergency Department (HOSPITAL_COMMUNITY)
Admission: EM | Admit: 2015-08-24 | Discharge: 2015-08-24 | Disposition: A | Discharge status: Home / Self Care | Payer: Medicare Other | Attending: Emergency Medicine | Admitting: Emergency Medicine

## 2015-08-24 ENCOUNTER — Encounter (HOSPITAL_COMMUNITY): Payer: Self-pay

## 2015-08-24 DIAGNOSIS — Z7902 Long term (current) use of antithrombotics/antiplatelets: Secondary | ICD-10-CM | POA: Insufficient documentation

## 2015-08-24 DIAGNOSIS — E11649 Type 2 diabetes mellitus with hypoglycemia without coma: Secondary | ICD-10-CM | POA: Diagnosis not present

## 2015-08-24 DIAGNOSIS — Z7982 Long term (current) use of aspirin: Secondary | ICD-10-CM | POA: Diagnosis not present

## 2015-08-24 DIAGNOSIS — I129 Hypertensive chronic kidney disease with stage 1 through stage 4 chronic kidney disease, or unspecified chronic kidney disease: Secondary | ICD-10-CM | POA: Diagnosis not present

## 2015-08-24 DIAGNOSIS — E162 Hypoglycemia, unspecified: Secondary | ICD-10-CM | POA: Diagnosis not present

## 2015-08-24 DIAGNOSIS — E161 Other hypoglycemia: Secondary | ICD-10-CM | POA: Diagnosis not present

## 2015-08-24 DIAGNOSIS — N182 Chronic kidney disease, stage 2 (mild): Secondary | ICD-10-CM | POA: Diagnosis not present

## 2015-08-24 DIAGNOSIS — Z87891 Personal history of nicotine dependence: Secondary | ICD-10-CM | POA: Insufficient documentation

## 2015-08-24 DIAGNOSIS — Z862 Personal history of diseases of the blood and blood-forming organs and certain disorders involving the immune mechanism: Secondary | ICD-10-CM | POA: Diagnosis not present

## 2015-08-24 DIAGNOSIS — F4489 Other dissociative and conversion disorders: Secondary | ICD-10-CM | POA: Diagnosis not present

## 2015-08-24 DIAGNOSIS — Z79899 Other long term (current) drug therapy: Secondary | ICD-10-CM | POA: Insufficient documentation

## 2015-08-24 LAB — CBC WITH DIFFERENTIAL/PLATELET
Basophils Absolute: 0 10*3/uL (ref 0.0–0.1)
Basophils Relative: 1 % (ref 0–1)
Eosinophils Absolute: 0.2 10*3/uL (ref 0.0–0.7)
Eosinophils Relative: 4 % (ref 0–5)
HCT: 36.3 % (ref 36.0–46.0)
Hemoglobin: 12.5 g/dL (ref 12.0–15.0)
LYMPHS ABS: 1.8 10*3/uL (ref 0.7–4.0)
LYMPHS PCT: 28 % (ref 12–46)
MCH: 31.5 pg (ref 26.0–34.0)
MCHC: 34.4 g/dL (ref 30.0–36.0)
MCV: 91.4 fL (ref 78.0–100.0)
Monocytes Absolute: 0.5 10*3/uL (ref 0.1–1.0)
Monocytes Relative: 7 % (ref 3–12)
Neutro Abs: 4 10*3/uL (ref 1.7–7.7)
Neutrophils Relative %: 60 % (ref 43–77)
PLATELETS: 197 10*3/uL (ref 150–400)
RBC: 3.97 MIL/uL (ref 3.87–5.11)
RDW: 12.6 % (ref 11.5–15.5)
WBC: 6.6 10*3/uL (ref 4.0–10.5)

## 2015-08-24 LAB — BASIC METABOLIC PANEL
Anion gap: 13 (ref 5–15)
BUN: 20 mg/dL (ref 6–20)
CHLORIDE: 105 mmol/L (ref 101–111)
CO2: 23 mmol/L (ref 22–32)
CREATININE: 1.33 mg/dL — AB (ref 0.44–1.00)
Calcium: 10.3 mg/dL (ref 8.9–10.3)
GFR calc Af Amer: 43 mL/min — ABNORMAL LOW (ref >60)
GFR calc non Af Amer: 37 mL/min — ABNORMAL LOW (ref >60)
GLUCOSE: 108 mg/dL — AB (ref 65–99)
POTASSIUM: 4 mmol/L (ref 3.5–5.1)
Sodium: 141 mmol/L (ref 135–145)

## 2015-08-24 LAB — CBG MONITORING, ED
Glucose-Capillary: 150 mg/dL — ABNORMAL HIGH (ref 65–99)
Glucose-Capillary: 149 mg/dL — ABNORMAL HIGH (ref 65–99)

## 2015-08-24 MED ORDER — SODIUM CHLORIDE 0.9 % IV SOLN: INTRAVENOUS | Status: DC

## 2015-08-24 NOTE — Discharge Instructions (Signed)
As discussed, your evaluation today has been largely reassuring.  But, it is important that you monitor your condition carefully, and do not hesitate to return to the ED if you develop new, or concerning changes in your condition.  Otherwise, please follow-up with your physician for appropriate ongoing care.  Low Blood Sugar Low blood sugar (hypoglycemia) means that the level of sugar in your blood is lower than it should be. Signs of low blood sugar include:  Getting sweaty.  Feeling hungry.  Feeling dizzy or weak.  Feeling sleepier than normal.  Feeling nervous.  Headaches.  Having a fast heartbeat. Low blood sugar can happen fast and can be an emergency. Your doctor can do tests to check your blood sugar level. You can have low blood sugar and not have diabetes. HOME CARE  Check your blood sugar as told by your doctor. If it is less than 70 mg/dl or as told by your doctor, take 1 of the following:  3 to 4 glucose tablets.   cup clear juice.   cup soda pop, not diet.  1 cup milk.  5 to 6 hard candies.  Recheck blood sugar after 15 minutes. Repeat until it is at the right level.  Eat a snack if it is more than 1 hour until the next meal.  Only take medicine as told by your doctor.  Do not skip meals. Eat on time.  Do not drink alcohol except with meals.  Check your blood glucose before driving.  Check your blood glucose before and after exercise.  Always carry treatment with you, such as glucose pills.  Always wear a medical alert bracelet if you have diabetes. GET HELP RIGHT AWAY IF:   Your blood glucose goes below 70 mg/dl or as told by your doctor, and you:  Are confused.  Are not able to swallow.  Pass out (faint).  You cannot treat yourself. You may need someone to help you.  You have low blood sugar problems often.  You have problems from your medicines.  You are not feeling better after 3 to 4 days.  You have vision changes. MAKE SURE  YOU:   Understand these instructions.  Will watch this condition.  Will get help right away if you are not doing well or get worse. Document Released: 03/09/2010 Document Revised: 03/06/2012 Document Reviewed: 03/09/2010 [facility] Patient Information 2015 [place], [place]. This information is not intended to replace advice given to you by your health care provider. Make sure you discuss any questions you have with your health care provider.

## 2015-08-24 NOTE — ED Provider Notes (Signed)
CSN: [id number]     Arrival date & time 08/24/15  1202 History   First MD Initiated Contact with Patient 08/24/15 1205     Chief Complaint  Patient presents with  . Hypoglycemia     (Consider location/radiation/quality/duration/timing/severity/associated sxs/prior Treatment) HPI Patient presents after an episode of disorientation, diaphoresis. Currently the patient has minimal complaints, states that she feels generally well. She acknowledges recently changing her home insulin dosing from 50 up to 56 units, as her blood glucose has been unusually high. Otherwise, no recent health issues. Per report, the patient was found by family members in the hours prior to ED arrival, altered, minimally responsive, diaphoretic. Initial CBG was 30. Patient received glucose by EMS providers, and glucose was 47. She subsequently received D50, and CBG went to 228.  Past Medical History  Diagnosis Date  . Diabetes mellitus   . Hypertension   . Chest pain, unspecified   . Herpes zoster with other nervous system complications(053.19)   . Nonspecific reaction to tuberculin skin test without active tuberculosis(795.51)   . Acute upper respiratory infections of unspecified site   . Type II or unspecified type diabetes mellitus with renal manifestations, uncontrolled   . Hypertensive renal disease, benign   . Proteinuria   . Type II or unspecified type diabetes mellitus with renal manifestations, not stated as uncontrolled   . Anemia, unspecified   . Unspecified disorder of kidney and ureter   . Disorder of bone and cartilage, unspecified   . Pain in joint, lower leg   . Diarrhea   . Hypercalcemia   . Other and unspecified hyperlipidemia   . Unspecified essential hypertension   . Atherosclerosis of native arteries of the extremities, unspecified   . DM (diabetes mellitus) type II controlled with renal manifestation   . Postherpetic neuralgia   . Nonspecific tuberculin test reaction   . Anemia    . Peripheral arterial disease   . Chronic kidney disease (CKD), stage II (mild)    Past Surgical History  Procedure Laterality Date  . Removal of cyst from hand    . Hysterectomy    . Tonsillectomy    . Removal of tumor from foot    . Incision and drainage Left 05/27/14    sebacous cyst, ear   Family History  Problem Relation Age of Onset  . Diabetes Mother   . Diabetes Father   . Diabetes Sister   . Diabetes Sister    Social History  Substance Use Topics  . Smoking status: Former Smoker    Types: Cigarettes  . Smokeless tobacco: Never Used     Comment: Quit about age 60   . Alcohol Use: No   OB History    No data available     Review of Systems  Constitutional:       Per HPI, otherwise negative  HENT:       Per HPI, otherwise negative  Respiratory:       Per HPI, otherwise negative  Cardiovascular:       Per HPI, otherwise negative  Gastrointestinal: Negative for vomiting.  Endocrine:       Negative aside from HPI  Genitourinary:       Neg aside from HPI   Musculoskeletal:       Per HPI, otherwise negative  Skin: Negative.   Neurological: Positive for weakness. Negative for syncope.      Allergies  Invokana and Jardiance  Home Medications   Prior to Admission medications  Medication Sig Start Date End Date Taking? Authorizing Provider  amLODipine (NORVASC) 10 MG tablet Take 10 mg by mouth daily. Take one tablet by mouth daily for high blood pressure 07/02/14  Yes Tiffany L Reed, DO  aspirin 81 MG chewable tablet Chew 81 mg by mouth daily.   Yes Historical Provider, MD  B-D ULTRAFINE III SHORT PEN 31G X 8 MM MISC USE WITH LANTUS EVERY DAY FOR BLOOD SUGAR   Yes Tiffany L Reed, DO  carvedilol (COREG) 25 MG tablet Take 1 tablet (25 mg total) by mouth 2 (two) times daily with a meal. 07/02/14  Yes Tiffany L Reed, DO  clopidogrel (PLAVIX) 75 MG tablet Take 1 tablet (75 mg total) by mouth daily. 06/21/14  Yes Tiffany L Reed, DO  enalapril (VASOTEC) 20 MG tablet  Take 20 mg by mouth at bedtime.   Yes Historical Provider, MD  glucose blood (ONE TOUCH ULTRA TEST) test strip Use as directed.  Dx: 250.40 08/19/14  Yes Tivis Ringer, RPH-CPP  Insulin Glargine (LANTUS) 100 UNIT/ML Solostar Pen Inject 54 Units into the skin daily. 07/02/14  Yes Tiffany L Reed, DO  insulin lispro (HUMALOG KWIKPEN) 100 UNIT/ML KiwkPen Inject 0.05 mLs (5 Units total) into the skin 3 (three) times daily with meals. 08/19/14  Yes Tivis Ringer, RPH-CPP  lovastatin (MEVACOR) 20 MG tablet Take 20 mg by mouth at bedtime. Take one tablet by mouth every day for cholesterol 07/02/14  Yes Tiffany L Reed, DO  metFORMIN (GLUCOPHAGE) 500 MG tablet TAKE 2 TABLETS (1,000 MG TOTAL) BY MOUTH 2 (TWO) TIMES DAILY WITH A MEAL. 06/11/15  Yes Tiffany L Reed, DO  nitroGLYCERIN (NITROSTAT) 0.4 MG SL tablet Place 1 tablet (0.4 mg total) under the tongue every 5 (five) minutes as needed for chest pain. 08/19/14  Yes Tivis Ringer, RPH-CPP  omeprazole (PRILOSEC) 20 MG capsule Take 20 mg by mouth daily as needed (heartburn).    Yes Historical Provider, MD  valsartan-hydrochlorothiazide (DIOVAN-HCT) 320-25 MG per tablet Take 1 tablet by mouth daily.  02/16/15  Yes Historical Provider, MD   BP 148/48 mmHg  Pulse 44  Resp 18  Ht 5\' 5"  (1.651 m)  Wt 146 lb (66.225 kg)  BMI 24.30 kg/m2  SpO2 100% Physical Exam  Constitutional: She is oriented to person, place, and time. She appears well-developed and well-nourished. No distress.  HENT:  Head: Normocephalic and atraumatic.  Eyes: Conjunctivae and EOM are normal.  Cardiovascular: Normal rate and regular rhythm.   Pulmonary/Chest: Effort normal and breath sounds normal. No stridor. No respiratory distress.  Abdominal: She exhibits no distension.  Musculoskeletal: She exhibits no edema.  Neurological: She is alert and oriented to person, place, and time. No cranial nerve deficit.  Skin: Skin is warm and dry.  Psychiatric: She has a normal mood and affect.  Nursing  note and vitals reviewed.   ED Course  Procedures (including critical care time) Labs Review Labs Reviewed  BASIC METABOLIC PANEL - Abnormal; Notable for the following:    Glucose, Bld 108 (*)    Creatinine, Ser 1.33 (*)    GFR calc non Af Amer 37 (*)    GFR calc Af Amer 43 (*)    All other components within normal limits  CBG MONITORING, ED - Abnormal; Notable for the following:    Glucose-Capillary 150 (*)    All other components within normal limits  CBG MONITORING, ED - Abnormal; Notable for the following:    Glucose-Capillary 149 (*)    All  other components within normal limits  CBC WITH DIFFERENTIAL/PLATELET  URINALYSIS, ROUTINE W REFLEX MICROSCOPIC (NOT AT [facility])     Initial Accu-Chek in the emergency room and 150  On repeat exam the patient appears well. Vital signs remain similar.  2:57 PM Patient in no distress, awake and alert. I discussed all findings with her and 2 daughters. We discussed the need for primary care follow-up, reverting to her typical insulin dosing, close monitoring. MDM  Patient presents after being found listless, diaphoretic by family members. Here the patient has largely reassuring labs, and her hypoglycemia resolved. No evidence for concurrent infection, imminent decompensation, and the patient was discharged in stable condition.  Carmin Muskrat, MD 08/24/15 [phone]

## 2015-08-24 NOTE — ED Notes (Signed)
[facility]- pt coming form home after family called for AMS and diaphoresis. Pt is diabetic. CBG initially 30, up to 61 after oral glucose and dropped again to 47. Pt given 1/2 amp of D50 and CBG up to 228. Pt still reports some weakness.

## 2015-08-29 ENCOUNTER — Other Ambulatory Visit: Payer: Self-pay | Admitting: Internal Medicine

## 2015-09-23 DIAGNOSIS — M8580 Other specified disorders of bone density and structure, unspecified site: Secondary | ICD-10-CM | POA: Diagnosis not present

## 2015-09-23 DIAGNOSIS — Z1231 Encounter for screening mammogram for malignant neoplasm of breast: Secondary | ICD-10-CM | POA: Diagnosis not present

## 2015-09-23 LAB — HM DEXA SCAN

## 2015-09-23 LAB — HM MAMMOGRAPHY: HM MAMMO: NORMAL

## 2015-09-26 ENCOUNTER — Encounter: Payer: Self-pay | Admitting: *Deleted

## 2015-10-09 ENCOUNTER — Encounter: Payer: Self-pay | Admitting: *Deleted

## 2015-10-10 ENCOUNTER — Encounter: Payer: Self-pay | Admitting: *Deleted

## 2015-10-20 ENCOUNTER — Ambulatory Visit: Payer: Medicare Other | Admitting: Internal Medicine

## 2015-10-23 ENCOUNTER — Ambulatory Visit (INDEPENDENT_AMBULATORY_CARE_PROVIDER_SITE_OTHER): Payer: Medicare Other | Admitting: Internal Medicine

## 2015-10-23 ENCOUNTER — Encounter: Payer: Self-pay | Admitting: Internal Medicine

## 2015-10-23 VITALS — BP 140/80 | HR 77 | Temp 98.5°F | Resp 20 | Ht 65.0 in | Wt 150.8 lb

## 2015-10-23 DIAGNOSIS — E785 Hyperlipidemia, unspecified: Secondary | ICD-10-CM

## 2015-10-23 DIAGNOSIS — E11649 Type 2 diabetes mellitus with hypoglycemia without coma: Secondary | ICD-10-CM | POA: Diagnosis not present

## 2015-10-23 DIAGNOSIS — Z23 Encounter for immunization: Secondary | ICD-10-CM

## 2015-10-23 DIAGNOSIS — N182 Chronic kidney disease, stage 2 (mild): Secondary | ICD-10-CM | POA: Diagnosis not present

## 2015-10-23 DIAGNOSIS — E1122 Type 2 diabetes mellitus with diabetic chronic kidney disease: Secondary | ICD-10-CM | POA: Diagnosis not present

## 2015-10-23 DIAGNOSIS — E1165 Type 2 diabetes mellitus with hyperglycemia: Secondary | ICD-10-CM

## 2015-10-23 DIAGNOSIS — N183 Chronic kidney disease, stage 3 unspecified: Secondary | ICD-10-CM

## 2015-10-23 DIAGNOSIS — IMO0002 Reserved for concepts with insufficient information to code with codable children: Secondary | ICD-10-CM

## 2015-10-23 DIAGNOSIS — Z794 Long term (current) use of insulin: Secondary | ICD-10-CM

## 2015-10-23 DIAGNOSIS — E1169 Type 2 diabetes mellitus with other specified complication: Secondary | ICD-10-CM

## 2015-10-23 MED ORDER — LOVASTATIN 20 MG PO TABS: 20.0000 mg | ORAL_TABLET | Freq: Every day | ORAL | Status: DC

## 2015-10-23 MED ORDER — ZOSTER VACCINE LIVE 19400 UNT/0.65ML ~~LOC~~ SOLR: 0.6500 mL | Freq: Once | SUBCUTANEOUS | Status: DC

## 2015-10-23 MED ORDER — NITROGLYCERIN 0.4 MG SL SUBL: 0.4000 mg | SUBLINGUAL_TABLET | SUBLINGUAL | Status: DC | PRN

## 2015-10-23 NOTE — Progress Notes (Signed)
Patient ID: DELONDA DECLERCK, female   DOB: 1935-09-11, 79 y.o.   MRN: [id number]   Location: [facility] Provider: Rexene Edison. Mariea Clonts, D.O., C.M.D.  Goals of Care: Advanced Directive information Does patient have an advance directive?: No, Would patient like information on creating an advanced directive?: Yes - Educational materials givencounseled on need for advance directive   Chief Complaint  Patient presents with  . Medical Management of Chronic Issues    4 month follow-up for Hypertension, Hyperglycemia, DM  . OTHER    Discuss Advanced Directive    HPI: Patient is a 80 y.o. female seen in the office today for med mgt of chronic diseases and hypoglycemia episodes with ED visits.  Felt ok when she woke up on a Sunday.  As morning went on, she felt shaky.  She left to go to church.  Saw she had a run in her stockings so she went back in to change. Felt sweaty, clothes felt tight and took her jacket off, then ended up taking her skirt off.  Son came back in to check on her and she didn't know what was going on.  Felt out of sorts like she didn't have control over herself.  Went to hospital.  CBG was low again.  Came back up while EMS was there, but then increased again easily.  Pt had increased her insulin a bit b/c her sugars were too high.  So went back to 50 units again.  She admits she probably did not eat breakfast on Sunday.    She had another episode about 3 wks ago after not feeling well, she left work in the afternoon to drive home.  As she was driving, she felt something was off.  Pulled over into a friend's driveway.  Sat down and rested, then passed out.  Friend brought her home.  Sometimes takes pills late, but faithfully takes her insulin.  Laid down, got her OJ, and then she was ok.  Eats breakfast and lunch, but sometimes does not eat a full dinner.  Tries to eat before bed or stomach hurts in morning.    Says something is going on in her head.  Getting frequent  headaches mostly at night.  Has been taking advil for this and it will go away and come back.  Says she sometimes cannot find words or sentences come out jumbled.    Still doesn't check glucose even daily.   Takes if she feels bad.    Review of Systems:  Review of Systems  Constitutional: Positive for malaise/fatigue and diaphoresis. Negative for fever, chills and weight loss.       Weight gain  HENT: Negative for congestion.   Eyes: Positive for blurred vision.  Respiratory: Negative for shortness of breath.   Cardiovascular: Negative for chest pain.  Gastrointestinal: Negative for abdominal pain.  Genitourinary: Negative for dysuria.  Musculoskeletal: Negative for myalgias and falls.  Skin: Negative for rash.  Neurological: Positive for weakness. Negative for dizziness.  Endo/Heme/Allergies: Does not bruise/bleed easily.  Psychiatric/Behavioral: Positive for memory loss.    Past Medical History  Diagnosis Date  . Diabetes mellitus   . Hypertension   . Chest pain, unspecified   . Herpes zoster with other nervous system complications(053.19)   . Nonspecific reaction to tuberculin skin test without active tuberculosis(795.51)   . Acute upper respiratory infections of unspecified site   . Type II or unspecified type diabetes mellitus with renal manifestations, uncontrolled   . Hypertensive renal  disease, benign   . Proteinuria   . Type II or unspecified type diabetes mellitus with renal manifestations, not stated as uncontrolled   . Anemia, unspecified   . Unspecified disorder of kidney and ureter   . Disorder of bone and cartilage, unspecified   . Pain in joint, lower leg   . Diarrhea   . Hypercalcemia   . Other and unspecified hyperlipidemia   . Unspecified essential hypertension   . Atherosclerosis of native arteries of the extremities, unspecified   . DM (diabetes mellitus) type II controlled with renal manifestation ([place])   . Postherpetic neuralgia   . Nonspecific  tuberculin test reaction   . Anemia   . Peripheral arterial disease ([place])   . Chronic kidney disease (CKD), stage II (mild)     Past Surgical History  Procedure Laterality Date  . Removal of cyst from hand    . Hysterectomy    . Tonsillectomy    . Removal of tumor from foot    . Incision and drainage Left 05/27/14    sebacous cyst, ear    Allergies  Allergen Reactions  . Invokana [Canagliflozin]     Vaginal Itching and irritation   . Jardiance [Empagliflozin] Itching      Medication List       This list is accurate as of: 10/23/15  1:35 PM.  Always use your most recent med list.               amLODipine 10 MG tablet  Commonly known as:  NORVASC  Take 10 mg by mouth daily. Take one tablet by mouth daily for high blood pressure     aspirin 81 MG chewable tablet  Chew 81 mg by mouth daily.     B-D ULTRAFINE III SHORT PEN 31G X 8 MM Misc  Generic drug:  Insulin Pen Needle  USE WITH LANTUS EVERY DAY FOR BLOOD SUGAR     carvedilol 25 MG tablet  Commonly known as:  COREG  Take 1 tablet (25 mg total) by mouth 2 (two) times daily with a meal.     clopidogrel 75 MG tablet  Commonly known as:  PLAVIX  Take 1 tablet (75 mg total) by mouth daily.     enalapril 20 MG tablet  Commonly known as:  VASOTEC  Take 20 mg by mouth at bedtime.     Insulin Glargine 100 UNIT/ML Solostar Pen  Commonly known as:  LANTUS  Inject 54 Units into the skin daily.     insulin lispro 100 UNIT/ML KiwkPen  Commonly known as:  HUMALOG KWIKPEN  Inject 0.05 mLs (5 Units total) into the skin 3 (three) times daily with meals.     lovastatin 20 MG tablet  Commonly known as:  MEVACOR  Take 20 mg by mouth at bedtime. Take one tablet by mouth every day for cholesterol     metFORMIN 500 MG tablet  Commonly known as:  GLUCOPHAGE  TAKE 2 TABLETS (1,000 MG TOTAL) BY MOUTH 2 (TWO) TIMES DAILY WITH A MEAL.     nitroGLYCERIN 0.4 MG SL tablet  Commonly known as:  NITROSTAT  Place 1 tablet (0.4 mg  total) under the tongue every 5 (five) minutes as needed for chest pain.     omeprazole 20 MG capsule  Commonly known as:  PRILOSEC  TAKE 1 CAPSULE (20 MG TOTAL) BY MOUTH DAILY.     ONE TOUCH ULTRA TEST test strip  Generic drug:  glucose blood  USE AS DIRECTED.  DX: 250.40     valsartan-hydrochlorothiazide 320-25 MG tablet  Commonly known as:  DIOVAN-HCT  Take 1 tablet by mouth daily.        Health Maintenance  Topic Date Due  . TETANUS/TDAP  06/25/1956  . PNA vac Low Risk Adult (2 of 2 - PCV13) 12/27/2006  . ZOSTAVAX  12/29/2015 (Originally 06/25/1997)  . HEMOGLOBIN A1C  02/14/2016  . URINE MICROALBUMIN  02/15/2016  . FOOT EXAM  02/18/2016  . INFLUENZA VACCINE  07/27/2016  . OPHTHALMOLOGY EXAM  08/03/2016  . DEXA SCAN  Completed    Physical Exam: Filed Vitals:   10/23/15 1304  BP: 140/80  Pulse: 77  Temp: 98.5 F (36.9 C)  TempSrc: Oral  Resp: 20  Height: 5\' 5"  (1.651 m)  Weight: 150 lb 12.8 oz (68.402 kg)  SpO2: 96%   Body mass index is 25.09 kg/(m^2). Physical Exam  Constitutional: She is oriented to person, place, and time. She appears well-developed and well-nourished. No distress.  Cardiovascular: Normal rate, regular rhythm and normal heart sounds.   Pulmonary/Chest: Effort normal and breath sounds normal.  Musculoskeletal: Normal range of motion.  Neurological: She is alert and oriented to person, place, and time.  Psychiatric: She has a normal mood and affect.    Labs reviewed: Basic Metabolic Panel:  Recent Labs  05/08/15 0836 08/14/15 0938 08/24/15 1258  NA 139 141 141  K 4.0 4.1 4.0  CL 100 103 105  CO2 24 24 23   GLUCOSE 185* 109* 108*  BUN 21 17 20   CREATININE 1.15* 1.44* 1.33*  CALCIUM 10.2 9.8 10.3   Liver Function Tests:  Recent Labs  02/21/15 0127 05/08/15 0836 08/14/15 0938  AST 22 16 15   ALT 16 10 9   ALKPHOS 84 94 70  BILITOT 0.4 0.2 0.2  PROT 6.9 6.5 6.4  ALBUMIN 3.7 3.9 3.8   No results for input(s): LIPASE,  AMYLASE in the last 8760 hours. No results for input(s): AMMONIA in the last 8760 hours. CBC:  Recent Labs  02/21/15 0127 08/24/15 1258  WBC 9.1 6.6  NEUTROABS 5.8 4.0  HGB 13.6 12.5  HCT 38.9 36.3  MCV 89.6 91.4  PLT 271 197   Lipid Panel:  Recent Labs  02/14/15 1021  CHOL 205*  HDL 77  LDLCALC 110*  TRIG 88  CHOLHDL 2.7   Lab Results  Component Value Date   HGBA1C 8.8* 08/14/2015    Assessment/Plan 1. Hypoglycemia associated with diabetes ([place]) -majority of today's visit was focused on addressing this -I previously spent an entire visit on the same subject -this time she was given a hypoglycemia handout with pictures of her symptoms she experiences, educated also verbally on how to handle these situations and advised to check her glucose prior to taking her insulin to help avoid this problem -she needs to eat regular meals and should not be taking her mealtime insulin and waiting prolonged times before eating--she expressed understanding and said she will adjust her morning routine and try to eat a snack late afternoon (fruit, healthy option) as this was the other time she's been getting lows--this will also hopefully prevent poor choices after work  2. Uncontrolled type 2 diabetes mellitus with stage 3 chronic kidney disease, with long-term current use of insulin ([place]) -has improved, but now with lows--is back down to 50 units of lantus due to the last episode  -educated as above  3. Chronic kidney disease (CKD), stage II (mild) -stable, cont to monitor, avoid nsaids and  other nephrotoxic meds  4. Hyperlipidemia associated with type 2 diabetes mellitus ([facility]) - stable--cont lovastatin (MEVACOR) 20 MG tablet; Take 1 tablet (20 mg total) by mouth at bedtime. Take one tablet by mouth every day for cholesterol  Dispense: 30 tablet; Refill: 3  5. Need for shingles vaccine -RX provided to get at pharmacy--says she will do this - zoster vaccine live, PF, (ZOSTAVAX) 36644  UNT/0.65ML injection; Inject 19,400 Units into the skin once.  Dispense: 1 each; Refill: 0  6. Need for vaccination with 13-polyvalent pneumococcal conjugate vaccine -we were going to give this but had none so will give at next appt  Labs/tests ordered:  Keep as scheduled Next appt:  11/18/2015 labs, then appt with [place]. Sabrea Sankey, D.O. [facility] [address], [place] [address] Cell Phone (Mon-Fri 8am-5pm):  [phone] On Call:  [phone] & follow prompts after 5pm & weekends Office Phone:  [phone] Office Fax:  [phone]

## 2015-10-23 NOTE — Patient Instructions (Addendum)
Take your glucose before your morning insulin. Take your humalog just before you are about to eat your meal.  You need to eat something each time you take humalog. Try to take an afternoon snack to work like an apple or grapes as a snack instead of crackers, chips, unhealthy choices when you get home and feel too hungry.

## 2015-10-28 ENCOUNTER — Ambulatory Visit (INDEPENDENT_AMBULATORY_CARE_PROVIDER_SITE_OTHER): Payer: Medicare Other | Admitting: Podiatry

## 2015-10-28 ENCOUNTER — Encounter: Payer: Self-pay | Admitting: Podiatry

## 2015-10-28 DIAGNOSIS — B351 Tinea unguium: Secondary | ICD-10-CM

## 2015-10-28 DIAGNOSIS — M79676 Pain in unspecified toe(s): Secondary | ICD-10-CM | POA: Diagnosis not present

## 2015-10-28 NOTE — Progress Notes (Signed)
Patient ID: Monique Cabrera, female   DOB: 12-Aug-1935, 81 y.o.   MRN: [id number] Complaint:  Visit Type: Patient returns to my office for continued preventative foot care services. Complaint: Patient states" my nails have grown long and thick and become painful to walk and wear shoes" Patient has been diagnosed with DM with neuropathy.. The patient presents for preventative foot care services. No changes to ROS  Podiatric Exam: Vascular: dorsalis pedis and posterior tibial pulses are palpable bilateral. Capillary return is immediate. Temperature gradient is WNL. Skin turgor WNL  Sensorium: Diminished  Semmes Weinstein monofilament test. Normal tactile sensation bilaterally. Nail Exam: Pt has thick disfigured discolored nails with subungual debris noted bilateral entire nail hallux through fifth toenails Ulcer Exam: There is no evidence of ulcer or pre-ulcerative changes or infection. Orthopedic Exam: Muscle tone and strength are WNL. No limitations in general ROM. No crepitus or effusions noted. Foot type and digits show no abnormalities. Bony prominences are unremarkable. Skin: No Porokeratosis. No infection or ulcers  Diagnosis:  Onychomycosis, , Pain in right toe, pain in left toes  Treatment & Plan Procedures and Treatment: Consent by patient was obtained for treatment procedures. The patient understood the discussion of treatment and procedures well. All questions were answered thoroughly reviewed. Debridement of mycotic and hypertrophic toenails, 1 through 5 bilateral and clearing of subungual debris. No ulceration, no infection noted.  Return Visit-Office Procedure: Patient instructed to return to the office for a follow up visit 3 months for continued evaluation and treatment.

## 2015-11-07 DIAGNOSIS — I10 Essential (primary) hypertension: Secondary | ICD-10-CM | POA: Diagnosis not present

## 2015-11-07 DIAGNOSIS — E1121 Type 2 diabetes mellitus with diabetic nephropathy: Secondary | ICD-10-CM | POA: Diagnosis not present

## 2015-11-07 DIAGNOSIS — N183 Chronic kidney disease, stage 3 (moderate): Secondary | ICD-10-CM | POA: Diagnosis not present

## 2015-11-08 ENCOUNTER — Other Ambulatory Visit: Payer: Self-pay | Admitting: Internal Medicine

## 2015-11-18 ENCOUNTER — Other Ambulatory Visit: Payer: Medicare Other

## 2015-11-24 ENCOUNTER — Ambulatory Visit (INDEPENDENT_AMBULATORY_CARE_PROVIDER_SITE_OTHER): Payer: Medicare Other | Admitting: Pharmacotherapy

## 2015-11-24 ENCOUNTER — Encounter: Payer: Self-pay | Admitting: Pharmacotherapy

## 2015-11-24 VITALS — BP 170/82 | HR 69 | Temp 97.9°F | Resp 20 | Ht 65.0 in | Wt 150.2 lb

## 2015-11-24 DIAGNOSIS — E1136 Type 2 diabetes mellitus with diabetic cataract: Secondary | ICD-10-CM

## 2015-11-24 DIAGNOSIS — E1122 Type 2 diabetes mellitus with diabetic chronic kidney disease: Secondary | ICD-10-CM | POA: Diagnosis not present

## 2015-11-24 DIAGNOSIS — Z23 Encounter for immunization: Secondary | ICD-10-CM | POA: Diagnosis not present

## 2015-11-24 DIAGNOSIS — Z794 Long term (current) use of insulin: Secondary | ICD-10-CM

## 2015-11-24 DIAGNOSIS — IMO0002 Reserved for concepts with insufficient information to code with codable children: Secondary | ICD-10-CM

## 2015-11-24 DIAGNOSIS — N182 Chronic kidney disease, stage 2 (mild): Secondary | ICD-10-CM

## 2015-11-24 DIAGNOSIS — E1165 Type 2 diabetes mellitus with hyperglycemia: Secondary | ICD-10-CM | POA: Diagnosis not present

## 2015-11-24 DIAGNOSIS — I10 Essential (primary) hypertension: Secondary | ICD-10-CM | POA: Diagnosis not present

## 2015-11-24 DIAGNOSIS — N183 Chronic kidney disease, stage 3 (moderate): Secondary | ICD-10-CM | POA: Diagnosis not present

## 2015-11-24 MED ORDER — CLONIDINE HCL 0.1 MG PO TABS: 0.1000 mg | ORAL_TABLET | Freq: Two times a day (BID) | ORAL | Status: DC

## 2015-11-24 MED ORDER — GLUCOSE BLOOD VI STRP: ORAL_STRIP | Status: DC

## 2015-11-24 MED ORDER — INSULIN LISPRO 100 UNIT/ML (KWIKPEN): 5.0000 [IU] | PEN_INJECTOR | Freq: Three times a day (TID) | SUBCUTANEOUS | Status: DC

## 2015-11-24 NOTE — Progress Notes (Signed)
  Subjective:    Monique Cabrera is a 80 y.o.African American  female who presents for follow-up of Type 2 diabetes mellitus.   No labs since August.  Last A1C was 8.8%.  Plans to get labs today.  Needs new BGM. She has seen nephrologist in November 2106.  He wants tighter BG control also. She self reports her average BG in the 150-160 range.  She did take her antihypertensives this morning. She has not been taking her Humalog - "I ran out" a week ago. Continues Lantus and metformin.  Struggles with healthy eating habits.  Often skips supper. Binges on snacks. No routine exercise. Wears corrective lenses. No peripheral edema. Denies problems with feet.  Sees podiatrist regularly. Nocturia once per night.   Review of Systems A comprehensive review of systems was negative except for: Eyes: positive for contacts/glasses Genitourinary: positive for nocturia Endocrine: positive for diabetic symptoms including blurry vision    Objective:    BP 170/82 mmHg  Pulse 69  Temp(Src) 97.9 F (36.6 C) (Oral)  Resp 20  Ht 5\' 5"  (1.651 m)  Wt 150 lb 3.2 oz (68.13 kg)  BMI 24.99 kg/m2  SpO2 97%  General:  alert, cooperative and no distress  Oropharynx: normal findings: lips normal without lesions and gums healthy   Eyes:  negative findings: lids and lashes normal and conjunctivae and sclerae normal, positive findings: has some cataracts   Ears:  external ears normal        Lung: clear to auscultation bilaterally  Heart:  regular rate and rhythm     Extremities: extremities normal, atraumatic, no cyanosis or edema  Skin: warm and dry, no hyperpigmentation, vitiligo, or suspicious lesions     Neuro: mental status, speech normal, alert and oriented x3 and gait and station normal   Lab Review GLUCOSE (mg/dL)  Date Value  08/14/2015 109*  05/08/2015 185*  02/14/2015 292*   GLUCOSE, BLD (mg/dL)  Date Value  08/24/2015 108*  02/21/2015 94  07/30/2014 345*   CO2 (mmol/L)   Date Value  08/24/2015 23  08/14/2015 24  05/08/2015 24   BUN (mg/dL)  Date Value  08/24/2015 20  08/14/2015 17  05/08/2015 21  02/21/2015 15  02/14/2015 18  07/30/2014 14   CREATININE, SER (mg/dL)  Date Value  08/24/2015 1.33*  08/14/2015 1.44*  05/08/2015 1.15*      Will get labs today.   Assessment:    Diabetes Mellitus type II, under fair control.   BP above target <140/90 CKD - stable per nephrology.   Plan:    1.  Rx changes: will wait to make DM changes pending labs. 2.  Repeat BP:  158/80.  Start clonidine 0.1mg  twice daily.  Continue enalapril, amlodipine, carvedilol, valsartan, HCTZ. 3.  Counseled on risk / benefit of clonidine. 4.  Counseled on nutrition goals and portion control. 5   Counseled on need for routine exercise.  Goal is 30-45 minutes 5 x week. 6.  Continue Humalog 5 units with each meal.  Counseled on importance of taking insulin exactly as prescribed. 7.  Continue Lantus 50 units daily. 8.  Continue metformin. 9.  RTC in 1 month to follow up BP.

## 2015-11-24 NOTE — Patient Instructions (Signed)
Start clonidine 0.1 mg twice daily.

## 2015-11-24 NOTE — Addendum Note (Signed)
Addended by: Eilene Ghazi on: 11/24/2015 11:54 AM   Modules accepted: Orders

## 2015-11-25 LAB — COMPREHENSIVE METABOLIC PANEL
ALT: 8 IU/L (ref 0–32)
AST: 11 IU/L (ref 0–40)
Albumin/Globulin Ratio: 1.5 (ref 1.1–2.5)
Albumin: 3.8 g/dL (ref 3.5–4.8)
Alkaline Phosphatase: 83 IU/L (ref 39–117)
BUN/Creatinine Ratio: 18 (ref 11–26)
BUN: 25 mg/dL (ref 8–27)
Bilirubin Total: 0.3 mg/dL (ref 0.0–1.2)
CO2: 21 mmol/L (ref 18–29)
Calcium: 10.1 mg/dL (ref 8.7–10.3)
Chloride: 100 mmol/L (ref 97–106)
Creatinine, Ser: 1.38 mg/dL — ABNORMAL HIGH (ref 0.57–1.00)
GFR calc Af Amer: 42 mL/min/{1.73_m2} — ABNORMAL LOW (ref >59)
GFR calc non Af Amer: 37 mL/min/{1.73_m2} — ABNORMAL LOW (ref >59)
Globulin, Total: 2.6 g/dL (ref 1.5–4.5)
Glucose: 279 mg/dL — ABNORMAL HIGH (ref 65–99)
Potassium: 4.2 mmol/L (ref 3.5–5.2)
Sodium: 137 mmol/L (ref 136–144)
Total Protein: 6.4 g/dL (ref 6.0–8.5)

## 2015-11-25 LAB — LIPID PANEL
Chol/HDL Ratio: 3.8 ratio units (ref 0.0–4.4)
Cholesterol, Total: 204 mg/dL — ABNORMAL HIGH (ref 100–199)
HDL: 53 mg/dL (ref >39)
LDL Calculated: 125 mg/dL — ABNORMAL HIGH (ref 0–99)
Triglycerides: 131 mg/dL (ref 0–149)
VLDL Cholesterol Cal: 26 mg/dL (ref 5–40)

## 2015-11-25 LAB — HEMOGLOBIN A1C
Est. average glucose Bld gHb Est-mCnc: 192 mg/dL
Hgb A1c MFr Bld: 8.3 % — ABNORMAL HIGH (ref 4.8–5.6)

## 2015-12-17 DIAGNOSIS — E113592 Type 2 diabetes mellitus with proliferative diabetic retinopathy without macular edema, left eye: Secondary | ICD-10-CM | POA: Diagnosis not present

## 2015-12-17 DIAGNOSIS — E113491 Type 2 diabetes mellitus with severe nonproliferative diabetic retinopathy without macular edema, right eye: Secondary | ICD-10-CM | POA: Diagnosis not present

## 2015-12-17 DIAGNOSIS — H2512 Age-related nuclear cataract, left eye: Secondary | ICD-10-CM | POA: Diagnosis not present

## 2015-12-17 DIAGNOSIS — H353121 Nonexudative age-related macular degeneration, left eye, early dry stage: Secondary | ICD-10-CM | POA: Diagnosis not present

## 2015-12-17 DIAGNOSIS — H2511 Age-related nuclear cataract, right eye: Secondary | ICD-10-CM | POA: Diagnosis not present

## 2016-01-05 ENCOUNTER — Ambulatory Visit: Payer: Medicare Other | Admitting: Pharmacotherapy

## 2016-01-09 ENCOUNTER — Other Ambulatory Visit: Payer: Self-pay | Admitting: Internal Medicine

## 2016-01-09 MED ORDER — OMEPRAZOLE 20 MG PO CPDR: DELAYED_RELEASE_CAPSULE | ORAL | Status: DC

## 2016-01-09 MED ORDER — VALSARTAN-HYDROCHLOROTHIAZIDE 320-25 MG PO TABS: ORAL_TABLET | ORAL | Status: DC

## 2016-01-09 NOTE — Telephone Encounter (Signed)
[facility] 

## 2016-01-19 ENCOUNTER — Ambulatory Visit (INDEPENDENT_AMBULATORY_CARE_PROVIDER_SITE_OTHER): Payer: Medicare Other | Admitting: Pharmacotherapy

## 2016-01-19 ENCOUNTER — Encounter: Payer: Self-pay | Admitting: Pharmacotherapy

## 2016-01-19 VITALS — BP 132/78 | HR 77 | Temp 98.1°F | Ht 65.0 in | Wt 147.0 lb

## 2016-01-19 DIAGNOSIS — E1122 Type 2 diabetes mellitus with diabetic chronic kidney disease: Secondary | ICD-10-CM | POA: Diagnosis not present

## 2016-01-19 DIAGNOSIS — H2511 Age-related nuclear cataract, right eye: Secondary | ICD-10-CM | POA: Diagnosis not present

## 2016-01-19 DIAGNOSIS — H25011 Cortical age-related cataract, right eye: Secondary | ICD-10-CM | POA: Diagnosis not present

## 2016-01-19 DIAGNOSIS — E1165 Type 2 diabetes mellitus with hyperglycemia: Secondary | ICD-10-CM | POA: Diagnosis not present

## 2016-01-19 DIAGNOSIS — H2513 Age-related nuclear cataract, bilateral: Secondary | ICD-10-CM | POA: Diagnosis not present

## 2016-01-19 DIAGNOSIS — I10 Essential (primary) hypertension: Secondary | ICD-10-CM

## 2016-01-19 DIAGNOSIS — Z794 Long term (current) use of insulin: Secondary | ICD-10-CM | POA: Diagnosis not present

## 2016-01-19 DIAGNOSIS — N183 Chronic kidney disease, stage 3 (moderate): Secondary | ICD-10-CM

## 2016-01-19 DIAGNOSIS — H25013 Cortical age-related cataract, bilateral: Secondary | ICD-10-CM | POA: Diagnosis not present

## 2016-01-19 DIAGNOSIS — N182 Chronic kidney disease, stage 2 (mild): Secondary | ICD-10-CM | POA: Diagnosis not present

## 2016-01-19 DIAGNOSIS — IMO0002 Reserved for concepts with insufficient information to code with codable children: Secondary | ICD-10-CM

## 2016-01-19 NOTE — Progress Notes (Signed)
  Subjective:    Monique Cabrera is a 81 y.o.African American female who presents for follow-up of Type 2 diabetes mellitus.   Has not tolerated clonidine.  Made her feel sick.  Now admits that she had been missing her prior BP medication. Last A1C was 8.3% in November 2016  Has not been checking her blood glucose very often. She said she had a bad episode of hypoglycemia - says "it was in the 40's"  EMS was called 3 weeks ago. Uses OJ for hypoglycemia treatment.  Forgot to bring her blood glucose meter. She reports her highest BG: 123m/dl Denies missing doses of insulin. She is trying to eat better choices.  Not skipping meals as often.  Eating more fruit for snacks. No routine exercise.  Limited due to right knee pain. Wears glasses. Has eye exam today. Nocturia once per night. Denies problems with feet. No peripheral edema.  Review of Systems A comprehensive review of systems was negative except for: Eyes: positive for contacts/glasses Genitourinary: positive for nocturia Musculoskeletal: positive for arthralgias    Objective:    BP 132/78 mmHg  Pulse 77  Temp(Src) 98.1 F (36.7 C) (Oral)  Ht 5' 5"$  (1.651 m)  Wt 147 lb (66.679 kg)  BMI 24.46 kg/m2  SpO2 96%  General:  alert, cooperative and no distress  Oropharynx: normal findings: lips normal without lesions and gums healthy   Eyes:  negative findings: lids and lashes normal and conjunctivae and sclerae normal   Ears:  external ears normal        Lung: clear to auscultation bilaterally  Heart:  regular rate and rhythm     Extremities: extremities normal, atraumatic, no cyanosis or edema  Skin: warm and dry, no hyperpigmentation, vitiligo, or suspicious lesions     Neuro: mental status, speech normal, alert and oriented x3 and gait and station normal   Lab Review GLUCOSE (mg/dL)  Date Value  11/24/2015 279*  08/14/2015 109*  05/08/2015 185*   GLUCOSE, BLD (mg/dL)  Date Value  08/24/2015 108*   02/21/2015 94  07/30/2014 345*   CO2 (mmol/L)  Date Value  11/24/2015 21  08/24/2015 23  08/14/2015 24   BUN (mg/dL)  Date Value  11/24/2015 25  08/24/2015 20  08/14/2015 17  05/08/2015 21  02/21/2015 15  07/30/2014 14   CREATININE, SER (mg/dL)  Date Value  11/24/2015 1.38*  08/24/2015 1.33*  08/14/2015 1.44*   A1C, CMP today    Assessment:    Diabetes Mellitus type II, under fair control. Self reported BG are OK.  Will check A1C today. BP at goal <140/90  Plan:    1.  Rx changes: none 2.  Continue Lantus 50 units daily 3.  Continue Humalog with meals. 4.  Counseled on hypoglycemia. 5   Reviewed nutrition goals. 6.  Exercise goal is 30-45 minutes 5 x week. 7.  BP at goal <140/90

## 2016-01-19 NOTE — Patient Instructions (Signed)
Continue current insulin doses.

## 2016-01-20 ENCOUNTER — Other Ambulatory Visit: Payer: Self-pay | Admitting: Internal Medicine

## 2016-01-20 LAB — COMPREHENSIVE METABOLIC PANEL
ALT: 12 IU/L (ref 0–32)
AST: 16 IU/L (ref 0–40)
Albumin/Globulin Ratio: 1.5 (ref 1.1–2.5)
Albumin: 4 g/dL (ref 3.5–4.8)
Alkaline Phosphatase: 72 IU/L (ref 39–117)
BUN/Creatinine Ratio: 14 (ref 11–26)
BUN: 16 mg/dL (ref 8–27)
Bilirubin Total: 0.5 mg/dL (ref 0.0–1.2)
CO2: 24 mmol/L (ref 18–29)
Calcium: 10.1 mg/dL (ref 8.7–10.3)
Chloride: 99 mmol/L (ref 96–106)
Creatinine, Ser: 1.18 mg/dL — ABNORMAL HIGH (ref 0.57–1.00)
GFR calc Af Amer: 51 mL/min/{1.73_m2} — ABNORMAL LOW (ref >59)
GFR calc non Af Amer: 44 mL/min/{1.73_m2} — ABNORMAL LOW (ref >59)
Globulin, Total: 2.7 g/dL (ref 1.5–4.5)
Glucose: 321 mg/dL — ABNORMAL HIGH (ref 65–99)
Potassium: 4.1 mmol/L (ref 3.5–5.2)
Sodium: 137 mmol/L (ref 134–144)
Total Protein: 6.7 g/dL (ref 6.0–8.5)

## 2016-01-20 LAB — MICROALBUMIN / CREATININE URINE RATIO
Creatinine, Urine: 78.7 mg/dL
MICROALB/CREAT RATIO: 1529.7 mg/g creat — ABNORMAL HIGH (ref 0.0–30.0)
Microalbumin, Urine: 1203.9 ug/mL

## 2016-01-20 LAB — HEMOGLOBIN A1C
Est. average glucose Bld gHb Est-mCnc: 209 mg/dL
Hgb A1c MFr Bld: 8.9 % — ABNORMAL HIGH (ref 4.8–5.6)

## 2016-01-23 ENCOUNTER — Ambulatory Visit: Payer: Medicare Other | Admitting: Internal Medicine

## 2016-01-26 ENCOUNTER — Ambulatory Visit (INDEPENDENT_AMBULATORY_CARE_PROVIDER_SITE_OTHER): Payer: Medicare Other | Admitting: Internal Medicine

## 2016-01-26 ENCOUNTER — Encounter: Payer: Self-pay | Admitting: Internal Medicine

## 2016-01-26 VITALS — BP 118/70 | HR 60 | Temp 97.6°F | Ht 65.0 in | Wt 148.0 lb

## 2016-01-26 DIAGNOSIS — I10 Essential (primary) hypertension: Secondary | ICD-10-CM | POA: Diagnosis not present

## 2016-01-26 DIAGNOSIS — Z794 Long term (current) use of insulin: Secondary | ICD-10-CM

## 2016-01-26 DIAGNOSIS — E1122 Type 2 diabetes mellitus with diabetic chronic kidney disease: Secondary | ICD-10-CM

## 2016-01-26 DIAGNOSIS — E1165 Type 2 diabetes mellitus with hyperglycemia: Secondary | ICD-10-CM

## 2016-01-26 DIAGNOSIS — N182 Chronic kidney disease, stage 2 (mild): Secondary | ICD-10-CM | POA: Diagnosis not present

## 2016-01-26 DIAGNOSIS — H269 Unspecified cataract: Secondary | ICD-10-CM | POA: Diagnosis not present

## 2016-01-26 DIAGNOSIS — N183 Chronic kidney disease, stage 3 unspecified: Secondary | ICD-10-CM

## 2016-01-26 DIAGNOSIS — IMO0002 Reserved for concepts with insufficient information to code with codable children: Secondary | ICD-10-CM

## 2016-01-26 NOTE — Patient Instructions (Signed)
Follow instructions given by Dr. Kellie Moor office for before your cataract surgery.  Try to eat three meals per day and have healthy fruits or veggies as snacks rather than chips, etc.

## 2016-01-26 NOTE — Progress Notes (Signed)
Patient ID: Monique Cabrera, female   DOB: 16-Mar-1935, 81 y.o.   MRN: [id number]   Location: [facility] [facility] Provider: Rexene Edison. Mariea Clonts, D.O., C.M.D.  Goals of Care: Advanced Directive information Does patient have an advance directive?: No  Info provided  Chief Complaint  Patient presents with  . Medical Management of Chronic Issues    medication management, blood sugar, cholesterol. Blood sugar at home 01/25/16 was 126. Does not check BS's daily.    HPI: Patient is a 81 y.o. female seen in the office today for med mgt of chronic diseases.  About one month ago she had an episode of hypoglycemia to 46.  Stayed home and was treated there rather than going to hospital.  Once episode since, knew she was getting low due to sweating.  Drank OJ right away.  Waited until it came up to 3 and then was ok.  Says she is looking at there paper I gave her for symptoms of lows  It happens very quickly for her.  Is keeping OJ in her bedroom in case.  Talked about consistent meals.  Has gotten some fruit as snacks now.  Is only eating breakfast and lunch.  She wants to try smoothies so suggested just one at supper to avoid hyperglycemia from sweet fruits.  Green tea--asks if she could lose weight with this--not sure it's factual that that's the case.      Blood pressure is at goal now.  On valsartan/hctz, vasotec, coreg and amlodipine.  Did not tolerate clonidine bid.    Has cataract surgery on right eye with Dr. Gershon Crane.  Takes plavix for her PAD since before I started to see her.  Is to hold metformin, insulin before her procedure.  Also sees Dr. Zadie Rhine for retinopathy.    Review of Systems:  Review of Systems  Constitutional: Positive for diaphoresis. Negative for fever and chills.       With low glucose  HENT: Negative for congestion.   Eyes: Positive for blurred vision.  Respiratory: Negative for shortness of breath.   Cardiovascular: Negative for chest pain, palpitations and leg swelling.    Gastrointestinal: Negative for abdominal pain.  Genitourinary: Negative for dysuria.  Musculoskeletal: Negative for falls.  Neurological: Negative for dizziness and loss of consciousness.  Psychiatric/Behavioral: Positive for memory loss.       Related to her poor glucose control    Past Medical History  Diagnosis Date  . Diabetes mellitus   . Hypertension   . Chest pain, unspecified   . Herpes zoster with other nervous system complications(053.19)   . Nonspecific reaction to tuberculin skin test without active tuberculosis(795.51)   . Acute upper respiratory infections of unspecified site   . Type II or unspecified type diabetes mellitus with renal manifestations, uncontrolled   . Hypertensive renal disease, benign   . Proteinuria   . Type II or unspecified type diabetes mellitus with renal manifestations, not stated as uncontrolled   . Anemia, unspecified   . Unspecified disorder of kidney and ureter   . Disorder of bone and cartilage, unspecified   . Pain in joint, lower leg   . Diarrhea   . Hypercalcemia   . Other and unspecified hyperlipidemia   . Unspecified essential hypertension   . Atherosclerosis of native arteries of the extremities, unspecified   . DM (diabetes mellitus) type II controlled with renal manifestation ([place])   . Postherpetic neuralgia   . Nonspecific tuberculin test reaction   . Anemia   .  Peripheral arterial disease ([place])   . Chronic kidney disease (CKD), stage II (mild)     Past Surgical History  Procedure Laterality Date  . Removal of cyst from hand    . Hysterectomy    . Tonsillectomy    . Removal of tumor from foot    . Incision and drainage Left 05/27/14    sebacous cyst, ear    Allergies  Allergen Reactions  . Invokana [Canagliflozin]     Vaginal Itching and irritation   . Jardiance [Empagliflozin] Itching      Medication List       This list is accurate as of: 01/26/16 11:01 AM.  Always use your most recent med list.                amLODipine 10 MG tablet  Commonly known as:  NORVASC  TAKE ONE TABLET BY MOUTH DAILY FOR HIGH BLOOD PRESSURE     aspirin 81 MG chewable tablet  Chew 81 mg by mouth daily.     B-D ULTRAFINE III SHORT PEN 31G X 8 MM Misc  Generic drug:  Insulin Pen Needle  USE WITH LANTUS EVERY DAY FOR BLOOD SUGAR     carvedilol 25 MG tablet  Commonly known as:  COREG  Take one tablet by mouth twice daily with a meal     clopidogrel 75 MG tablet  Commonly known as:  PLAVIX  Take one tablet by mouth once daily     enalapril 20 MG tablet  Commonly known as:  VASOTEC  Take 20 mg by mouth at bedtime.     glucose blood test strip  Commonly known as:  ONETOUCH VERIO  Use as instructed     Insulin Glargine 100 UNIT/ML Solostar Pen  Commonly known as:  LANTUS  Inject 50 Units into the skin daily.     insulin lispro 100 UNIT/ML KiwkPen  Commonly known as:  HUMALOG KWIKPEN  Inject 0.05 mLs (5 Units total) into the skin 3 (three) times daily.     lovastatin 20 MG tablet  Commonly known as:  MEVACOR  Take 1 tablet (20 mg total) by mouth at bedtime. Take one tablet by mouth every day for cholesterol     metFORMIN 500 MG tablet  Commonly known as:  GLUCOPHAGE  TAKE 2 TABLETS (1,000 MG TOTAL) BY MOUTH 2 (TWO) TIMES DAILY WITH A MEAL.     nitroGLYCERIN 0.4 MG SL tablet  Commonly known as:  NITROSTAT  Place 1 tablet (0.4 mg total) under the tongue every 5 (five) minutes as needed for chest pain.     omeprazole 20 MG capsule  Commonly known as:  PRILOSEC  Take one tablet by mouth once daily for stomach     valsartan-hydrochlorothiazide 320-25 MG tablet  Commonly known as:  DIOVAN-HCT  Take one tablet by mouth once daily        Health Maintenance  Topic Date Due  . TETANUS/TDAP  06/25/1956  . ZOSTAVAX  06/25/1997  . FOOT EXAM  02/18/2016  . HEMOGLOBIN A1C  07/18/2016  . INFLUENZA VACCINE  07/27/2016  . OPHTHALMOLOGY EXAM  08/03/2016  . DEXA SCAN  Completed  . PNA vac Low Risk  Adult  Completed    Physical Exam: Filed Vitals:   01/26/16 1044  BP: 118/70  Pulse: 60  Temp: 97.6 F (36.4 C)  TempSrc: Oral  Height: 5\' 5"  (1.651 m)  Weight: 148 lb (67.132 kg)  SpO2: 98%   Body mass index is 24.63  kg/(m^2). Physical Exam  Constitutional: She is oriented to person, place, and time. She appears well-developed and well-nourished.  Cardiovascular: Normal rate, regular rhythm, normal heart sounds and intact distal pulses.   Pulmonary/Chest: Effort normal and breath sounds normal. No respiratory distress.  Abdominal: Soft. Bowel sounds are normal. She exhibits no distension and no mass. There is no tenderness.  Musculoskeletal: Normal range of motion. She exhibits no tenderness.  Neurological: She is alert and oriented to person, place, and time.  Skin: Skin is warm and dry.  Psychiatric: She has a normal mood and affect.    Labs reviewed: Basic Metabolic Panel:  Recent Labs  08/24/15 1258 11/24/15 0938 01/19/16 0930  NA 141 137 137  K 4.0 4.2 4.1  CL 105 100 99  CO2 23 21 24   GLUCOSE 108* 279* 321*  BUN 20 25 16   CREATININE 1.33* 1.38* 1.18*  CALCIUM 10.3 10.1 10.1   Liver Function Tests:  Recent Labs  08/14/15 0938 11/24/15 0938 01/19/16 0930  AST 15 11 16   ALT 9 8 12   ALKPHOS 70 83 72  BILITOT 0.2 0.3 0.5  PROT 6.4 6.4 6.7  ALBUMIN 3.8 3.8 4.0   No results for input(s): LIPASE, AMYLASE in the last 8760 hours. No results for input(s): AMMONIA in the last 8760 hours. CBC:  Recent Labs  02/21/15 0127 08/24/15 1258  WBC 9.1 6.6  NEUTROABS 5.8 4.0  HGB 13.6 12.5  HCT 38.9 36.3  MCV 89.6 91.4  PLT 271 197   Lipid Panel:  Recent Labs  02/14/15 1021 11/24/15 0938  CHOL 205* 204*  HDL 77 53  LDLCALC 110* 125*  TRIG 88 131  CHOLHDL 2.7 3.8   Lab Results  Component Value Date   HGBA1C 8.9* 01/19/2016   Assessment/Plan 1. Uncontrolled type 2 diabetes mellitus with stage 3 chronic kidney disease, with long-term current use  of insulin (HCC) -poorly controlled -still not adherent with three meals per day and eats unhealthy snacks  2. Essential hypertension -bp at goal this time, cont same regimen which includes ace and arb  3. Chronic kidney disease (CKD), stage II (mild) -stable at this time -avoid nsaids -drink more water  4. Right cataract -is for surgery later this week -follow instructions about meds to hold and those to take the morning of procedure--she was going to hold all and then her bp would have been through the roof  Labs/tests ordered:  Ordered by Dr. Sabra Heck Next appt:  05/27/2016 for med mgt  Garvis Downum L. Memori Sammon, D.O. [facility] [address], [place] [address] Cell Phone (Mon-Fri 8am-5pm):  [phone] On Call:  [phone] & follow prompts after 5pm & weekends Office Phone:  [phone] Office Fax:  [phone]

## 2016-01-28 DIAGNOSIS — H2511 Age-related nuclear cataract, right eye: Secondary | ICD-10-CM | POA: Diagnosis not present

## 2016-01-28 DIAGNOSIS — H25011 Cortical age-related cataract, right eye: Secondary | ICD-10-CM | POA: Diagnosis not present

## 2016-01-29 DIAGNOSIS — H25012 Cortical age-related cataract, left eye: Secondary | ICD-10-CM | POA: Diagnosis not present

## 2016-01-29 DIAGNOSIS — H2512 Age-related nuclear cataract, left eye: Secondary | ICD-10-CM | POA: Diagnosis not present

## 2016-02-04 DIAGNOSIS — H25012 Cortical age-related cataract, left eye: Secondary | ICD-10-CM | POA: Diagnosis not present

## 2016-02-04 DIAGNOSIS — H2512 Age-related nuclear cataract, left eye: Secondary | ICD-10-CM | POA: Diagnosis not present

## 2016-02-05 ENCOUNTER — Other Ambulatory Visit: Payer: Self-pay | Admitting: Internal Medicine

## 2016-02-10 ENCOUNTER — Ambulatory Visit: Payer: Medicare Other | Admitting: Podiatry

## 2016-02-24 ENCOUNTER — Encounter: Payer: Self-pay | Admitting: Podiatry

## 2016-02-24 ENCOUNTER — Ambulatory Visit (INDEPENDENT_AMBULATORY_CARE_PROVIDER_SITE_OTHER): Payer: Medicare Other | Admitting: Podiatry

## 2016-02-24 DIAGNOSIS — M79676 Pain in unspecified toe(s): Secondary | ICD-10-CM | POA: Diagnosis not present

## 2016-02-24 DIAGNOSIS — B351 Tinea unguium: Secondary | ICD-10-CM

## 2016-02-24 NOTE — Progress Notes (Signed)
Patient ID: Monique Cabrera, female   DOB: 1935/11/23, 81 y.o.   MRN: [id number] Complaint:  Visit Type: Patient returns to my office for continued preventative foot care services. Complaint: Patient states" my nails have grown long and thick and become painful to walk and wear shoes" Patient has been diagnosed with DM with neuropathy.. The patient presents for preventative foot care services. No changes to ROS  Podiatric Exam: Vascular: dorsalis pedis and posterior tibial pulses are palpable bilateral. Capillary return is immediate. Temperature gradient is WNL. Skin turgor WNL  Sensorium: Diminished  Semmes Weinstein monofilament test. Normal tactile sensation bilaterally. Nail Exam: Pt has thick disfigured discolored nails with subungual debris noted bilateral entire nail hallux through fifth toenails Ulcer Exam: There is no evidence of ulcer or pre-ulcerative changes or infection. Orthopedic Exam: Muscle tone and strength are WNL. No limitations in general ROM. No crepitus or effusions noted. Foot type and digits show no abnormalities. Bony prominences are unremarkable. Skin: No Porokeratosis. No infection or ulcers  Diagnosis:  Onychomycosis, , Pain in right toe, pain in left toes  Treatment & Plan Procedures and Treatment: Consent by patient was obtained for treatment procedures. The patient understood the discussion of treatment and procedures well. All questions were answered thoroughly reviewed. Debridement of mycotic and hypertrophic toenails, 1 through 5 bilateral and clearing of subungual debris. No ulceration, no infection noted.  Return Visit-Office Procedure: Patient instructed to return to the office for a follow up visit 3 months for continued evaluation and treatment.   Gardiner Barefoot DPM

## 2016-03-02 ENCOUNTER — Other Ambulatory Visit: Payer: Self-pay | Admitting: Nurse Practitioner

## 2016-03-02 MED ORDER — INSULIN GLARGINE 100 UNIT/ML SOLOSTAR PEN: 50.0000 [IU] | PEN_INJECTOR | Freq: Every day | SUBCUTANEOUS | Status: DC

## 2016-03-02 NOTE — Telephone Encounter (Signed)
Refill sent to pharmacy.   

## 2016-03-06 ENCOUNTER — Other Ambulatory Visit: Payer: Self-pay | Admitting: Internal Medicine

## 2016-03-08 DIAGNOSIS — Z961 Presence of intraocular lens: Secondary | ICD-10-CM | POA: Diagnosis not present

## 2016-04-14 ENCOUNTER — Other Ambulatory Visit: Payer: Medicare Other

## 2016-04-14 DIAGNOSIS — N183 Chronic kidney disease, stage 3 (moderate): Secondary | ICD-10-CM | POA: Diagnosis not present

## 2016-04-14 DIAGNOSIS — Z794 Long term (current) use of insulin: Principal | ICD-10-CM

## 2016-04-14 DIAGNOSIS — IMO0002 Reserved for concepts with insufficient information to code with codable children: Secondary | ICD-10-CM

## 2016-04-14 DIAGNOSIS — E1165 Type 2 diabetes mellitus with hyperglycemia: Principal | ICD-10-CM

## 2016-04-14 DIAGNOSIS — E1122 Type 2 diabetes mellitus with diabetic chronic kidney disease: Secondary | ICD-10-CM | POA: Diagnosis not present

## 2016-04-15 LAB — COMPREHENSIVE METABOLIC PANEL
ALT: 6 IU/L (ref 0–32)
AST: 16 IU/L (ref 0–40)
Albumin/Globulin Ratio: 1.4 (ref 1.2–2.2)
Albumin: 4.1 g/dL (ref 3.5–4.8)
Alkaline Phosphatase: 71 IU/L (ref 39–117)
BUN/Creatinine Ratio: 13 (ref 12–28)
BUN: 16 mg/dL (ref 8–27)
Bilirubin Total: 0.4 mg/dL (ref 0.0–1.2)
CO2: 28 mmol/L (ref 18–29)
Calcium: 10.2 mg/dL (ref 8.7–10.3)
Chloride: 94 mmol/L — ABNORMAL LOW (ref 96–106)
Creatinine, Ser: 1.21 mg/dL — ABNORMAL HIGH (ref 0.57–1.00)
GFR calc Af Amer: 50 mL/min/{1.73_m2} — ABNORMAL LOW (ref >59)
GFR calc non Af Amer: 43 mL/min/{1.73_m2} — ABNORMAL LOW (ref >59)
Globulin, Total: 3 g/dL (ref 1.5–4.5)
Glucose: 123 mg/dL — ABNORMAL HIGH (ref 65–99)
Potassium: 3.5 mmol/L (ref 3.5–5.2)
Sodium: 139 mmol/L (ref 134–144)
Total Protein: 7.1 g/dL (ref 6.0–8.5)

## 2016-04-15 LAB — HEMOGLOBIN A1C
Est. average glucose Bld gHb Est-mCnc: 269 mg/dL
Hgb A1c MFr Bld: 11 % — ABNORMAL HIGH (ref 4.8–5.6)

## 2016-04-19 ENCOUNTER — Ambulatory Visit (INDEPENDENT_AMBULATORY_CARE_PROVIDER_SITE_OTHER): Payer: Medicare Other | Admitting: Pharmacotherapy

## 2016-04-19 ENCOUNTER — Encounter: Payer: Self-pay | Admitting: Pharmacotherapy

## 2016-04-19 VITALS — BP 142/78 | HR 64 | Temp 98.4°F | Ht 65.0 in | Wt 147.0 lb

## 2016-04-19 DIAGNOSIS — I10 Essential (primary) hypertension: Secondary | ICD-10-CM

## 2016-04-19 DIAGNOSIS — E1122 Type 2 diabetes mellitus with diabetic chronic kidney disease: Secondary | ICD-10-CM

## 2016-04-19 DIAGNOSIS — N182 Chronic kidney disease, stage 2 (mild): Secondary | ICD-10-CM

## 2016-04-19 DIAGNOSIS — E1165 Type 2 diabetes mellitus with hyperglycemia: Secondary | ICD-10-CM | POA: Diagnosis not present

## 2016-04-19 DIAGNOSIS — N183 Chronic kidney disease, stage 3 (moderate): Secondary | ICD-10-CM | POA: Diagnosis not present

## 2016-04-19 DIAGNOSIS — IMO0002 Reserved for concepts with insufficient information to code with codable children: Secondary | ICD-10-CM

## 2016-04-19 DIAGNOSIS — Z794 Long term (current) use of insulin: Secondary | ICD-10-CM

## 2016-04-19 NOTE — Progress Notes (Signed)
Subjective:    Monique Cabrera is a 81 y.o.African American female who presents for follow-up of Type 2 diabetes mellitus.   A1C now 11.0% (up from 8.9%).  She states she is taking her medications. Renal function essentially stable She is supposed to be taking Lantus, Humalog, and Metformin.  She admits to not taking her Humalog and metformin several times.  She did not bring her blood glucose meter. She admits her glucose is "too high" She says she had a 56 last week.  Says "I have been sick in the bed all last week"  Felt nausea and stomach pain, felt dizzy.  BG was "HI"  >500.  She responded by taking her BG and drinking OJ. She says "something is wrong"  She feels different than she ever has.  She feels old.  Does not want to participate in activities.  She feels overwhelmed that she now feels like she has diabetes.  She had cataract removal.  Can see better, but still has blurry vision in left eye.  Legally blind in left eye.  Can't read very well - even with her glasses. No peripheral edema. Has a bunion on left great toe.  Has appointment with podiatrist in May. Nocturia at least twice per night. Denies polyuria during the day. Staying well hydrated with water during the day.  Has been eating a lot of Easter candy. No routine exercise.  She admits to missing her enalapril most of the time.  Review of Systems A comprehensive review of systems was negative except for: Constitutional: positive for fatigue Eyes: positive for cataracts and blurry vision Gastrointestinal: positive for change in bowel habits, constipation and diarrhea Genitourinary: positive for nocturia Musculoskeletal: positive for muscle weakness    Objective:    BP 142/78 mmHg  Pulse 64  Temp(Src) 98.4 F (36.9 C) (Oral)  Ht 5' 5"$  (1.651 m)  Wt 147 lb (66.679 kg)  BMI 24.46 kg/m2  SpO2 99%  General:  alert, cooperative and no distress  Oropharynx: normal findings: lips normal without lesions and  gums healthy   Eyes:  negative findings: lids and lashes normal and conjunctivae and sclerae normal   Ears:  external ears normal        Lung: clear to auscultation bilaterally  Heart:  regular rate and rhythm     Extremities: bunion on left great toe  Skin: dry     Neuro: mental status, speech normal, alert and oriented x3 and gait and station normal   Lab Review GLUCOSE (mg/dL)  Date Value  04/14/2016 123*  01/19/2016 321*  11/24/2015 279*   GLUCOSE, BLD (mg/dL)  Date Value  08/24/2015 108*  02/21/2015 94  07/30/2014 345*   CO2 (mmol/L)  Date Value  04/14/2016 28  01/19/2016 24  11/24/2015 21   BUN (mg/dL)  Date Value  04/14/2016 16  01/19/2016 16  11/24/2015 25  08/24/2015 20  02/21/2015 15  07/30/2014 14   CREATININE, SER (mg/dL)  Date Value  04/14/2016 1.21*  01/19/2016 1.18*  11/24/2015 1.38*       Assessment:    Diabetes Mellitus type II, under poor control. A1C above goal <7% BP above target <140/90 due to missed enalapril doses. Renal function essentially stable.   Plan:    1.  Rx changes: Restart Humalog 5 units with each meal.  Must take all doses exactly as prescribed. 2.  Counseled at length on complications of uncontrolled DM.  Made patient aware that many of her symptoms she is  expressing are directly related to uncontrolled hyperglycemia. 3.  Continue Lantus 50 units daily. 4.  Continue metformin. 5.  Exercise goal is 30-45 minutes 5 x week. 6.  Counseled on nutrition goals. 7.  BP above target due to missed enalapril dose.  Advised her to change administration time of enalapril to supper to avoid missed doses. 8.  RTC in 6 weeks to follow up Humalog efficacy.

## 2016-04-19 NOTE — Patient Instructions (Signed)
You MUST take Humalog 5 units with each meal.  No missed doses.

## 2016-04-24 ENCOUNTER — Other Ambulatory Visit: Payer: Self-pay | Admitting: Nurse Practitioner

## 2016-04-25 DIAGNOSIS — E162 Hypoglycemia, unspecified: Secondary | ICD-10-CM | POA: Diagnosis not present

## 2016-04-25 DIAGNOSIS — E161 Other hypoglycemia: Secondary | ICD-10-CM | POA: Diagnosis not present

## 2016-04-27 ENCOUNTER — Telehealth: Payer: Self-pay | Admitting: *Deleted

## 2016-04-27 DIAGNOSIS — R404 Transient alteration of awareness: Secondary | ICD-10-CM | POA: Diagnosis not present

## 2016-04-27 DIAGNOSIS — E162 Hypoglycemia, unspecified: Secondary | ICD-10-CM | POA: Diagnosis not present

## 2016-04-27 NOTE — Telephone Encounter (Signed)
Patient called regarding her BS being too low, and she has passed out. She stated that she may not be eating enough food, because her appetite is not good. We discussed her eating habits and the process that she is using may not be working for her, I told her that I would give her some meal plans and food that she should not eat. She will keep a chart of her food that she is eating and what her BS is daily.

## 2016-04-28 ENCOUNTER — Telehealth: Payer: Self-pay

## 2016-04-28 MED ORDER — PANTOPRAZOLE SODIUM 40 MG PO TBEC: 40.0000 mg | DELAYED_RELEASE_TABLET | Freq: Every day | ORAL | Status: DC

## 2016-04-28 NOTE — Telephone Encounter (Signed)
Patient aware of medication change. New RX for Protonix 40 mg sent to the pharmacy.   I called the pharmacy to inform them of medication change.

## 2016-04-28 NOTE — Telephone Encounter (Signed)
She should really be on protonix 40mg  daily if her insurance will cover it instead of omeprazole.  Not sure why it's taken this long for it to be flagged.  Suspect she cannot afford more than omeprazole.  Continue plavix.

## 2016-04-28 NOTE — Telephone Encounter (Signed)
Pharmacy is calling to confirm that it is ok to continue patient on Plavix and omeprazole. Medications flag a warning with each other, omeprazole decreases the effectiveness of Plavix.  Please advise

## 2016-04-29 ENCOUNTER — Encounter: Payer: Self-pay | Admitting: Internal Medicine

## 2016-04-29 ENCOUNTER — Ambulatory Visit (INDEPENDENT_AMBULATORY_CARE_PROVIDER_SITE_OTHER): Payer: Medicare Other | Admitting: Internal Medicine

## 2016-04-29 VITALS — BP 140/60 | HR 68 | Temp 98.0°F | Ht 65.0 in | Wt 150.0 lb

## 2016-04-29 DIAGNOSIS — Z794 Long term (current) use of insulin: Secondary | ICD-10-CM

## 2016-04-29 DIAGNOSIS — IMO0002 Reserved for concepts with insufficient information to code with codable children: Secondary | ICD-10-CM

## 2016-04-29 DIAGNOSIS — N183 Chronic kidney disease, stage 3 unspecified: Secondary | ICD-10-CM

## 2016-04-29 DIAGNOSIS — E1165 Type 2 diabetes mellitus with hyperglycemia: Secondary | ICD-10-CM

## 2016-04-29 DIAGNOSIS — E1122 Type 2 diabetes mellitus with diabetic chronic kidney disease: Secondary | ICD-10-CM

## 2016-04-29 DIAGNOSIS — E11649 Type 2 diabetes mellitus with hypoglycemia without coma: Secondary | ICD-10-CM | POA: Diagnosis not present

## 2016-04-29 DIAGNOSIS — I10 Essential (primary) hypertension: Secondary | ICD-10-CM | POA: Diagnosis not present

## 2016-04-29 DIAGNOSIS — K219 Gastro-esophageal reflux disease without esophagitis: Secondary | ICD-10-CM

## 2016-04-29 NOTE — Patient Instructions (Addendum)
Check sugar first thing in am.   Take your lantus insulin. Eat breakfast.  Take 5 units of humalog Eat lunch.  Check sugar again and if over 150, take 5 units of humalog; if under 150, do not take humalog Eat a small snack when you get home from work like fruit or yogurt Eat dinner.  Check sugar again and if over 150, take 5 units of humalog Before bed, eat a small snack like 1/2 a peanut butter sandwich or apple with peanut butter or fruit with cottage cheese or veggies with hummus  If sugar is less than 70, drink OJ or regular soda, then eat a snack like the bedtime snacks above.    STOP OMEPRAZOLE (PRILOSEC).  TAKE PROTONIX (AT PHARMACY) INSTEAD FOR YOUR REFLUX.

## 2016-04-29 NOTE — Progress Notes (Signed)
Patient ID: Monique Cabrera, female   DOB: [birth year], 81 y.o.   MRN: [id number]   Location:  [facility] clinic Provider:  Mikia Delaluz L. Mariea Clonts, D.O., C.M.D.  Code Status: full code Goals of Care:  Advanced Directives 04/19/2016  Does patient have an advance directive? No  Would patient like information on creating an advanced directive? Yes - Geneticist, molecular Complaint  Patient presents with  . Medical Management of Chronic Issues    discuss blood sugar    HPI: Patient is a 81 y.o. female seen today for medical management of chronic diseases.  She has concerns about her blood sugars.  Says sugars keep falling.  Had felt bad since Easter Sunday.  Had an ulcer several years ago.  Had been told it would heal, but it could flare back up.  Had EGD and that physician said he couldn't see where she's had an ulcer.  Was hurting across the top of her abdomen.  Felt nauseated, but did not vomit.  Started to drink milk to help the symptoms.  It went away and hasn't bothered her since.    Had been eating candy.  Say around Easter sugar was as high as 500.  Took her insulin and it came down which is in 120s.    Hba1c 11 now.  Cathey counseled on importance of checking her sugar, eating regularly and taking her medicine.  Has not missed her medicine and has been checking her sugar and insulin.  Is taking with her to work.  Doing what she should.  Now sugar is dropping.  Son has had to call ambulance 3 times due to low sugars:  Sun, Mon, and Wed.      Had been 126 when she got up yesterday.  Had been walking around and it dropped.  Got nervous, shaky and couldn't talk and felt like her tongue was swollen.  CBG was 56 yesterday.  Admits she was not checking her sugar like she should before last visit with Cathey and missed her meds on saturdays b/c she did not follow a schedule.    Ate a snack last night as I had advised probably over a year ago after the paramedic told her to take that.     Past Medical History  Diagnosis Date  . Diabetes mellitus   . Hypertension   . Chest pain, unspecified   . Herpes zoster with other nervous system complications(053.19)   . Nonspecific reaction to tuberculin skin test without active tuberculosis(795.51)   . Acute upper respiratory infections of unspecified site   . Type II or unspecified type diabetes mellitus with renal manifestations, uncontrolled   . Hypertensive renal disease, benign   . Proteinuria   . Type II or unspecified type diabetes mellitus with renal manifestations, not stated as uncontrolled   . Anemia, unspecified   . Unspecified disorder of kidney and ureter   . Disorder of bone and cartilage, unspecified   . Pain in joint, lower leg   . Diarrhea   . Hypercalcemia   . Other and unspecified hyperlipidemia   . Unspecified essential hypertension   . Atherosclerosis of native arteries of the extremities, unspecified   . DM (diabetes mellitus) type II controlled with renal manifestation ([place])   . Postherpetic neuralgia   . Nonspecific tuberculin test reaction   . Anemia   . Peripheral arterial disease ([place])   . Chronic kidney disease (CKD), stage II (mild)     Past Surgical History  Procedure Laterality Date  . Removal of cyst from hand    . Hysterectomy    . Tonsillectomy    . Removal of tumor from foot    . Incision and drainage Left 05/27/14    sebacous cyst, ear    Allergies  Allergen Reactions  . Invokana [Canagliflozin] Other (See Comments)    Vagina itching and swelling Vaginal Itching and irritation   . Jardiance [Empagliflozin] Itching and Other (See Comments)    Vaginal itching and swelling      Medication List       This list is accurate as of: 04/29/16  3:01 PM.  Always use your most recent med list.               amLODipine 10 MG tablet  Commonly known as:  NORVASC  TAKE ONE TABLET BY MOUTH DAILY FOR HIGH BLOOD PRESSURE     aspirin 81 MG chewable tablet  Chew 81 mg by mouth daily.      B-D ULTRAFINE III SHORT PEN 31G X 8 MM Misc  Generic drug:  Insulin Pen Needle  USE WITH LANTUS EVERY DAY FOR BLOOD SUGAR     carvedilol 25 MG tablet  Commonly known as:  COREG  Take one tablet by mouth twice daily with a meal     clopidogrel 75 MG tablet  Commonly known as:  PLAVIX  Take one tablet by mouth once daily     enalapril 20 MG tablet  Commonly known as:  VASOTEC  Take 20 mg by mouth at bedtime.     glucose blood test strip  Commonly known as:  ONETOUCH VERIO  Use as instructed     insulin lispro 100 UNIT/ML KiwkPen  Commonly known as:  HUMALOG KWIKPEN  Inject 0.05 mLs (5 Units total) into the skin 3 (three) times daily.     LANTUS SOLOSTAR 100 UNIT/ML Solostar Pen  Generic drug:  Insulin Glargine  Inject 50 Units into the skin daily.     lovastatin 20 MG tablet  Commonly known as:  MEVACOR  Take 1 tablet (20 mg total) by mouth at bedtime. Take one tablet by mouth every day for cholesterol     metFORMIN 500 MG tablet  Commonly known as:  GLUCOPHAGE  TAKE 2 TABLETS (1,000 MG TOTAL) BY MOUTH 2 (TWO) TIMES DAILY WITH A MEAL.     pantoprazole 40 MG tablet  Commonly known as:  PROTONIX  Take 1 tablet (40 mg total) by mouth daily.     valsartan-hydrochlorothiazide 320-25 MG tablet  Commonly known as:  DIOVAN-HCT  Take one tablet by mouth once daily        Review of Systems:  ROS  Health Maintenance  Topic Date Due  . TETANUS/TDAP  06/25/1956  . ZOSTAVAX  06/25/1997  . FOOT EXAM  02/18/2016  . INFLUENZA VACCINE  07/27/2016  . OPHTHALMOLOGY EXAM  08/03/2016  . HEMOGLOBIN A1C  10/14/2016  . DEXA SCAN  Completed  . PNA vac Low Risk Adult  Completed    Physical Exam: Filed Vitals:   04/29/16 1455  Height: 5\' 5"  (1.651 m)  Weight: 150 lb (68.04 kg)   Body mass index is 24.96 kg/(m^2). Physical Exam  Labs reviewed: Basic Metabolic Panel:  Recent Labs  11/24/15 0938 01/19/16 0930 04/14/16 0922  NA 137 137 139  K 4.2 4.1 3.5  CL 100  99 94*  CO2 21 24 28   GLUCOSE 279* 321* 123*  BUN 25 16 16  CREATININE 1.38* 1.18* 1.21*  CALCIUM 10.1 10.1 10.2   Liver Function Tests:  Recent Labs  11/24/15 0938 01/19/16 0930 04/14/16 0922  AST 11 16 16   ALT 8 12 6   ALKPHOS 83 72 71  BILITOT 0.3 0.5 0.4  PROT 6.4 6.7 7.1  ALBUMIN 3.8 4.0 4.1   No results for input(s): LIPASE, AMYLASE in the last 8760 hours. No results for input(s): AMMONIA in the last 8760 hours. CBC:  Recent Labs  08/24/15 1258  WBC 6.6  NEUTROABS 4.0  HGB 12.5  HCT 36.3  MCV 91.4  PLT 197   Lipid Panel:  Recent Labs  11/24/15 0938  CHOL 204*  HDL 53  LDLCALC 125*  TRIG 131  CHOLHDL 3.8   Lab Results  Component Value Date   HGBA1C 11.0* 04/14/2016    Assessment/Plan 1. Uncontrolled type 2 diabetes mellitus with stage 3 chronic kidney disease, with long-term current use of insulin ([place]) -detailed instructions provided on when to check glucose, take insulin, treat hypoglycemia and when and what to eat -she felt better at the end of the visit  2. Hypoglycemia due to type 2 diabetes mellitus (HCC) -sugars low overnight several times this week -counseled on need for bedtime snack  3. Essential hypertension -bp at upper limits today -cont amlodipine, coreg, enalapril, valsartan/hct  4. Gastroesophageal reflux disease without esophagitis -change prilosec to protonix to get rid of interaction with plavix  Next appt:  Keep appt next month  Ameri Cahoon L. Jamesrobert Ohanesian, D.O. [facility] [address], [place] [address] Cell Phone (Mon-Fri 8am-5pm):  [phone] On Call:  [phone] & follow prompts after 5pm & weekends Office Phone:  [phone] Office Fax:  [phone]

## 2016-05-25 ENCOUNTER — Ambulatory Visit: Payer: Medicare Other | Admitting: Podiatry

## 2016-05-27 ENCOUNTER — Encounter: Payer: Self-pay | Admitting: Internal Medicine

## 2016-05-27 ENCOUNTER — Ambulatory Visit: Payer: Medicare Other | Admitting: Internal Medicine

## 2016-05-31 ENCOUNTER — Encounter: Payer: Self-pay | Admitting: Pharmacotherapy

## 2016-05-31 ENCOUNTER — Ambulatory Visit (INDEPENDENT_AMBULATORY_CARE_PROVIDER_SITE_OTHER): Payer: Medicare Other | Admitting: Pharmacotherapy

## 2016-05-31 VITALS — BP 122/70 | HR 73 | Temp 98.2°F | Wt 144.4 lb

## 2016-05-31 DIAGNOSIS — E1122 Type 2 diabetes mellitus with diabetic chronic kidney disease: Secondary | ICD-10-CM | POA: Diagnosis not present

## 2016-05-31 DIAGNOSIS — Z794 Long term (current) use of insulin: Secondary | ICD-10-CM

## 2016-05-31 DIAGNOSIS — I10 Essential (primary) hypertension: Secondary | ICD-10-CM | POA: Diagnosis not present

## 2016-05-31 DIAGNOSIS — IMO0002 Reserved for concepts with insufficient information to code with codable children: Secondary | ICD-10-CM

## 2016-05-31 DIAGNOSIS — E1165 Type 2 diabetes mellitus with hyperglycemia: Secondary | ICD-10-CM

## 2016-05-31 DIAGNOSIS — N183 Chronic kidney disease, stage 3 (moderate): Secondary | ICD-10-CM

## 2016-05-31 NOTE — Patient Instructions (Signed)
Eat a bedtime snack if blood sugar is <100 before bedtime.

## 2016-05-31 NOTE — Progress Notes (Signed)
  Subjective:    Monique Cabrera is a 81 y.o.African American female who presents for follow-up of Type 2 diabetes mellitus.  A1C rose to 11% due to non-adherence with insulin. Was instructed to restart Humalog with meals.  Forgot to bring blood glucose meter. She says she is doing so much better.   She says she is taking her insulin as prescribed. No hypoglycemia since her OV with Dr. Mariea Clonts. She reports BG in the 120's.  She is making healthier food choices. She is walking for exercise. Denies problems with vision.  Not wearing her glasses today.  No peripheral edema. Has a bunion on right foot.  Has a podiatry appointment scheduled. Nocturia twice per night. No dysuria.  Review of Systems A comprehensive review of systems was negative except for: Genitourinary: positive for nocturia Musculoskeletal: positive for foot pain    Objective:    BP 122/70 mmHg  Pulse 73  Temp(Src) 98.2 F (36.8 C) (Oral)  Wt 144 lb 6.4 oz (65.499 kg)  SpO2 98%  General:  alert, cooperative and no distress  Oropharynx: normal findings: lips normal without lesions and gums healthy   Eyes:  negative findings: lids and lashes normal and conjunctivae and sclerae normal   Ears:  external ears normal        Lung: clear to auscultation bilaterally  Heart:  regular rate and rhythm     Extremities: bunion on right foot  Skin: dry     Neuro: mental status, speech normal, alert and oriented x3 and gait and station normal   Lab Review GLUCOSE (mg/dL)  Date Value  04/14/2016 123*  01/19/2016 321*  11/24/2015 279*   GLUCOSE, BLD (mg/dL)  Date Value  08/24/2015 108*  02/21/2015 94  07/30/2014 345*   CO2 (mmol/L)  Date Value  04/14/2016 28  01/19/2016 24  11/24/2015 21   BUN (mg/dL)  Date Value  04/14/2016 16  01/19/2016 16  11/24/2015 25  08/24/2015 20  02/21/2015 15  07/30/2014 14   CREATININE, SER (mg/dL)  Date Value  04/14/2016 1.21*  01/19/2016 1.18*  11/24/2015 1.38*       Assessment:    Diabetes Mellitus type II, under good control. Self reported BG are much improved. BP at goal <140/90   Plan:    1.  Rx changes: none 2.  Place LibrePro continuous glucose monitor to left arm for 14 days to better determine BG patterns for safe adjustment of insulin doses. 3.  Continue Lantus 50 units once daily. 4.  Continue Humalog 5 units with each meal. 5.  Continue metformin. 6.  Counseled on hypoglycemia 7.  BP at goal <140/90 8.  RTC in 2 weeks to follow up CGM interpretation.

## 2016-06-05 ENCOUNTER — Other Ambulatory Visit: Payer: Self-pay | Admitting: Internal Medicine

## 2016-06-08 ENCOUNTER — Other Ambulatory Visit: Payer: Self-pay

## 2016-06-10 DIAGNOSIS — I10 Essential (primary) hypertension: Secondary | ICD-10-CM | POA: Diagnosis not present

## 2016-06-10 DIAGNOSIS — E1121 Type 2 diabetes mellitus with diabetic nephropathy: Secondary | ICD-10-CM | POA: Diagnosis not present

## 2016-06-10 DIAGNOSIS — N183 Chronic kidney disease, stage 3 (moderate): Secondary | ICD-10-CM | POA: Diagnosis not present

## 2016-06-11 ENCOUNTER — Encounter: Payer: Self-pay | Admitting: Internal Medicine

## 2016-06-11 ENCOUNTER — Ambulatory Visit (INDEPENDENT_AMBULATORY_CARE_PROVIDER_SITE_OTHER): Payer: Medicare Other | Admitting: Internal Medicine

## 2016-06-11 VITALS — BP 122/60 | HR 71 | Temp 98.4°F | Ht 65.0 in | Wt 144.0 lb

## 2016-06-11 DIAGNOSIS — N183 Chronic kidney disease, stage 3 unspecified: Secondary | ICD-10-CM

## 2016-06-11 DIAGNOSIS — Z794 Long term (current) use of insulin: Secondary | ICD-10-CM

## 2016-06-11 DIAGNOSIS — E1165 Type 2 diabetes mellitus with hyperglycemia: Secondary | ICD-10-CM

## 2016-06-11 DIAGNOSIS — N182 Chronic kidney disease, stage 2 (mild): Secondary | ICD-10-CM

## 2016-06-11 DIAGNOSIS — K219 Gastro-esophageal reflux disease without esophagitis: Secondary | ICD-10-CM | POA: Diagnosis not present

## 2016-06-11 DIAGNOSIS — IMO0002 Reserved for concepts with insufficient information to code with codable children: Secondary | ICD-10-CM

## 2016-06-11 DIAGNOSIS — E785 Hyperlipidemia, unspecified: Secondary | ICD-10-CM

## 2016-06-11 DIAGNOSIS — E1169 Type 2 diabetes mellitus with other specified complication: Secondary | ICD-10-CM | POA: Diagnosis not present

## 2016-06-11 DIAGNOSIS — E11649 Type 2 diabetes mellitus with hypoglycemia without coma: Secondary | ICD-10-CM

## 2016-06-11 DIAGNOSIS — E1122 Type 2 diabetes mellitus with diabetic chronic kidney disease: Secondary | ICD-10-CM

## 2016-06-11 DIAGNOSIS — I10 Essential (primary) hypertension: Secondary | ICD-10-CM | POA: Diagnosis not present

## 2016-06-11 NOTE — Patient Instructions (Signed)
Please reschedule your appointment with Dr. Gershon Crane.   Make sure you keep your podiatry visit.   I will send Dr. Florene Glen my note and your lab results.

## 2016-06-11 NOTE — Progress Notes (Signed)
Location:  [facility] clinic Provider:  Kani Chauvin L. Mariea Clonts, D.O., C.M.D.  Goals of Care:  Advanced Directives 06/11/2016  Does patient have an advance directive? No  Would patient like information on creating an advanced directive? -  we have provided her with educational material and a blank living will/hcpoa form, but she has not completed this and returned a copy at this time  Chief Complaint  Patient presents with  . Medical Management of Chronic Issues    4 mth follow-up    HPI: Patient is a 81 y.o. female seen today for medical management of chronic diseases.    She comes back Monday for [place] to review her continuous glucose monitor.   Has not had any problems at all with low sugar since her last visit with me.  Has been following the directions at times.  Has been higher than it should be some mornings on occasion. Was 113 today.  She tells me she is taking her insulin and eating properly.    She says Dr. Florene Glen said her kidneys were stable and she does not need to f/u with him for 1 year.  He did request she have a BMP today.  She says that she was having difficulty remembering her medications at the appt--we need to confirm with his office what he wants her taking.    Is to go to podiatry soon.  I will do her foot exam today.  Discussed avoiding shoes that rub against her bunion.  She wants sandals, but the ones she has rub over that area and she can't stand the flip flop kind.  Discussed diabetic shoes, but she is not interested.  She forgot her appt with Dr. Gershon Crane.  She has called to reschedule but no one answered so she's going to call again today, she says.  Does have retina specialist appt 6/21.    BP was good today.  Is on both ace and arb.  I suspect that was the concern at nephrology also, but she is taking both right now--need to confirm with nephrology.    She has again refused tetanus and zostavax--will never get them done at pharmacy.    Past Medical History    Diagnosis Date  . Diabetes mellitus   . Hypertension   . Chest pain, unspecified   . Herpes zoster with other nervous system complications(053.19)   . Nonspecific reaction to tuberculin skin test without active tuberculosis(795.51)   . Acute upper respiratory infections of unspecified site   . Type II or unspecified type diabetes mellitus with renal manifestations, uncontrolled   . Hypertensive renal disease, benign   . Proteinuria   . Type II or unspecified type diabetes mellitus with renal manifestations, not stated as uncontrolled   . Anemia, unspecified   . Unspecified disorder of kidney and ureter   . Disorder of bone and cartilage, unspecified   . Pain in joint, lower leg   . Diarrhea   . Hypercalcemia   . Other and unspecified hyperlipidemia   . Unspecified essential hypertension   . Atherosclerosis of native arteries of the extremities, unspecified   . DM (diabetes mellitus) type II controlled with renal manifestation ([place])   . Postherpetic neuralgia   . Nonspecific tuberculin test reaction   . Anemia   . Peripheral arterial disease ([place])   . Chronic kidney disease (CKD), stage II (mild)     Past Surgical History  Procedure Laterality Date  . Removal of cyst from hand    .  Hysterectomy    . Tonsillectomy    . Removal of tumor from foot    . Incision and drainage Left 05/27/14    sebacous cyst, ear    Allergies  Allergen Reactions  . Invokana [Canagliflozin] Other (See Comments)    Vagina itching and swelling Vaginal Itching and irritation   . Jardiance [Empagliflozin] Itching and Other (See Comments)    Vaginal itching and swelling      Medication List       This list is accurate as of: 06/11/16  9:53 AM.  Always use your most recent med list.               amLODipine 10 MG tablet  Commonly known as:  NORVASC  TAKE ONE TABLET BY MOUTH DAILY FOR HIGH BLOOD PRESSURE     aspirin 81 MG chewable tablet  Chew 81 mg by mouth daily. Reported on 05/31/2016      B-D ULTRAFINE III SHORT PEN 31G X 8 MM Misc  Generic drug:  Insulin Pen Needle  USE WITH LANTUS EVERY DAY FOR BLOOD SUGAR     carvedilol 25 MG tablet  Commonly known as:  COREG  Take one tablet by mouth twice daily with a meal     clopidogrel 75 MG tablet  Commonly known as:  PLAVIX  Take one tablet by mouth once daily     enalapril 20 MG tablet  Commonly known as:  VASOTEC  Take 20 mg by mouth at bedtime. Reported on 05/31/2016     glucose blood test strip  Commonly known as:  ONETOUCH VERIO  Use as instructed     insulin lispro 100 UNIT/ML KiwkPen  Commonly known as:  HUMALOG KWIKPEN  Inject 0.05 mLs (5 Units total) into the skin 3 (three) times daily.     LANTUS SOLOSTAR 100 UNIT/ML Solostar Pen  Generic drug:  Insulin Glargine  Inject 50 Units into the skin daily. Reported on 05/31/2016     lovastatin 20 MG tablet  Commonly known as:  MEVACOR  Take 1 tablet (20 mg total) by mouth at bedtime. Take one tablet by mouth every day for cholesterol     metFORMIN 500 MG tablet  Commonly known as:  GLUCOPHAGE  TAKE 2 TABLETS BY MOUTH TWICE A DAY WITH MEALS     pantoprazole 40 MG tablet  Commonly known as:  PROTONIX  Take 1 tablet (40 mg total) by mouth daily.     valsartan-hydrochlorothiazide 320-25 MG tablet  Commonly known as:  DIOVAN-HCT  Take one tablet by mouth once daily        Review of Systems:  Review of Systems  Constitutional: Negative for fever, chills and malaise/fatigue.  HENT: Negative for congestion.   Eyes: Negative for blurred vision.       Retinopathy  Respiratory: Negative for shortness of breath.   Cardiovascular: Negative for chest pain, palpitations and leg swelling.  Gastrointestinal: Negative for abdominal pain.  Genitourinary: Negative for dysuria.  Musculoskeletal: Negative for falls.  Skin: Negative for rash.  Neurological: Positive for tingling, sensory change and focal weakness. Negative for dizziness, loss of consciousness, weakness  and headaches.  Endo/Heme/Allergies:       Uncontrolled dm due to noncompliance/nonadherence  Psychiatric/Behavioral: Positive for memory loss. Negative for depression.       Due to poor glucose control    Health Maintenance  Topic Date Due  . FOOT EXAM  02/18/2016  . INFLUENZA VACCINE  07/27/2016  . OPHTHALMOLOGY EXAM  08/03/2016  . HEMOGLOBIN A1C  10/14/2016  . DEXA SCAN  Completed  . PNA vac Low Risk Adult  Completed    Physical Exam: Filed Vitals:   06/11/16 0924  BP: 122/60  Pulse: 71  Temp: 98.4 F (36.9 C)  TempSrc: Oral  Height: 5\' 5"  (1.651 m)  Weight: 144 lb (65.318 kg)  SpO2: 98%   Body mass index is 23.96 kg/(m^2). Physical Exam  Constitutional: She is oriented to person, place, and time. No distress.  Cardiovascular: Normal rate, regular rhythm, normal heart sounds and intact distal pulses.   Pulmonary/Chest: Effort normal and breath sounds normal. No respiratory distress.  Abdominal: Soft. Bowel sounds are normal. She exhibits no distension and no mass. There is no tenderness.  Musculoskeletal: Normal range of motion.  See diabetic foot exam  Neurological: She is alert and oriented to person, place, and time.  Skin: Skin is warm and dry.  Psychiatric: She has a normal mood and affect.    Labs reviewed: Basic Metabolic Panel:  Recent Labs  11/24/15 0938 01/19/16 0930 04/14/16 0922  NA 137 137 139  K 4.2 4.1 3.5  CL 100 99 94*  CO2 21 24 28   GLUCOSE 279* 321* 123*  BUN 25 16 16   CREATININE 1.38* 1.18* 1.21*  CALCIUM 10.1 10.1 10.2   Liver Function Tests:  Recent Labs  11/24/15 0938 01/19/16 0930 04/14/16 0922  AST 11 16 16   ALT 8 12 6   ALKPHOS 83 72 71  BILITOT 0.3 0.5 0.4  PROT 6.4 6.7 7.1  ALBUMIN 3.8 4.0 4.1   No results for input(s): LIPASE, AMYLASE in the last 8760 hours. No results for input(s): AMMONIA in the last 8760 hours. CBC:  Recent Labs  08/24/15 1258  WBC 6.6  NEUTROABS 4.0  HGB 12.5  HCT 36.3  MCV 91.4    PLT 197   Lipid Panel:  Recent Labs  11/24/15 0938  CHOL 204*  HDL 53  LDLCALC 125*  TRIG 131  CHOLHDL 3.8   Lab Results  Component Value Date   HGBA1C 11.0* 04/14/2016    Assessment/Plan 1. Uncontrolled type 2 diabetes mellitus with stage 3 chronic kidney disease, with long-term current use of insulin ([place]) - says she is following the regimen I prescribed and has not had lows since, but occasional fasting glucose being higher than usual -wearing continuous glucose monitor thru [employer] so we can actually see what is going on as she is not honest with me - Basic metabolic panel  2. Essential hypertension -bp is at goal -on ace and arb which is not typical--I wonder which one she's meant to be taking--she has no idea -get Dr. Abel Presto notes  3. Hypoglycemia due to type 2 diabetes mellitus ([place]) -improved by report--await continuous monitoring results from Dr. Sabra Heck  4. Chronic kidney disease (CKD), stage II (mild) - following with nephrology for diabetic glomerulosclerosis--she is uncertain of the meds she takes from Dr. Florene Glen--? How many she actually does take, is out of, etc.   - Basic metabolic panel  5. Gastroesophageal reflux disease without esophagitis -continues on protonix which is the only ppi she can take while on plavix for her PAD  6. Hyperlipidemia associated with type 2 diabetes mellitus ([place]) -continues on lovastatin 20mg  daily -will need to adjust if no better next time as her LDL was 125 in Nov with goal of <70 assuming she is taking the medication currently  Labs/tests ordered:   Orders Placed This Encounter  Procedures  .  Basic metabolic panel    Order Specific Question:  Has the patient fasted?    Answer:  No   Next appt:  06/14/2016 with [place], f/u with me in 3 mos.09/13/2016  Deliliah Spranger L. Brinn Westby, D.O. [facility] [address], [place] [address] Cell Phone (Mon-Fri 8am-5pm):   [phone] On Call:  [phone] & follow prompts after 5pm & weekends Office Phone:  [phone] Office Fax:  [phone]

## 2016-06-14 ENCOUNTER — Ambulatory Visit (INDEPENDENT_AMBULATORY_CARE_PROVIDER_SITE_OTHER): Payer: Medicare Other | Admitting: Pharmacotherapy

## 2016-06-14 ENCOUNTER — Encounter: Payer: Self-pay | Admitting: Pharmacotherapy

## 2016-06-14 VITALS — BP 142/70 | HR 66 | Temp 97.3°F | Ht 65.0 in | Wt 144.2 lb

## 2016-06-14 DIAGNOSIS — IMO0002 Reserved for concepts with insufficient information to code with codable children: Secondary | ICD-10-CM

## 2016-06-14 DIAGNOSIS — Z794 Long term (current) use of insulin: Secondary | ICD-10-CM | POA: Diagnosis not present

## 2016-06-14 DIAGNOSIS — E1122 Type 2 diabetes mellitus with diabetic chronic kidney disease: Secondary | ICD-10-CM

## 2016-06-14 DIAGNOSIS — E1165 Type 2 diabetes mellitus with hyperglycemia: Secondary | ICD-10-CM | POA: Diagnosis not present

## 2016-06-14 DIAGNOSIS — I10 Essential (primary) hypertension: Secondary | ICD-10-CM

## 2016-06-14 DIAGNOSIS — N183 Chronic kidney disease, stage 3 (moderate): Secondary | ICD-10-CM | POA: Diagnosis not present

## 2016-06-14 MED ORDER — INSULIN LISPRO 100 UNIT/ML (KWIKPEN): 8.0000 [IU] | PEN_INJECTOR | Freq: Three times a day (TID) | SUBCUTANEOUS | Status: DC

## 2016-06-14 NOTE — Progress Notes (Signed)
Subjective:    Monique Cabrera is a 81 y.o.African American female who presents for follow-up of Type 2 diabetes mellitus.   We placed a LibrePro continuous glucose monitor on patient 2 weeks ago.  She is here today for download and interpretation.  She has noticed high BG (190-220) in the morning Afternoon BG in the 130s No hypoglycemia Lowest BG: 111mg /dl  Continues metformin Lantus 50 units daily Humalog 5 units with each meal.  Admits to missing 1 or 2 lunchtime doses.  CGMS interpretation: Estimated A1C:  11% Actual A1C:  11% (04/14/16) Average BG: 270mg /dl Time spent in target range (70-150):  11% Time spent above target range:  88% Time spent below target range:  1% Hypoglycemic events:  1 Duration of hypoglycemia:  105 minutes. Pattern indicates that when she does take all doses of insulin, her average BG is 162-185.  When she does not take all insulin doses her average BG: ranges:  240-452mg /dl.  Wears corrective lenses.  Missed eye exam. Denies vision problems. Denies problems with feet and legs. No peripheral edema. Nocturia twice per night. Denies dysuria. Staying well hydrated.  Eating about the same. Exercise is "iffy"   Review of Systems A comprehensive review of systems was negative except for: Eyes: positive for contacts/glasses Genitourinary: positive for nocturia Integument/breast: positive for dry skin Endocrine: positive for diabetic symptoms including polydipsia, polyuria and skin dryness    Objective:    BP 142/70 mmHg  Pulse 66  Temp(Src) 97.3 F (36.3 C) (Oral)  Ht 5\' 5"  (1.651 m)  Wt 144 lb 3.2 oz (65.409 kg)  BMI 24.00 kg/m2  SpO2 98%  General:  alert, cooperative and no distress  Oropharynx: normal findings: lips normal without lesions and gums healthy   Eyes:  negative findings: lids and lashes normal and conjunctivae and sclerae normal   Ears:  external ears normal        Lung: clear to auscultation bilaterally  Heart:   regular rate and rhythm     Extremities: no edema, redness or tenderness in the calves or thighs  Skin: dry     Neuro: mental status, speech normal, alert and oriented x3 and gait and station normal   Lab Review GLUCOSE (mg/dL)  Date Value  06/11/2016 331*  04/14/2016 123*  01/19/2016 321*   GLUCOSE, BLD (mg/dL)  Date Value  08/24/2015 108*  02/21/2015 94  07/30/2014 345*   CO2 (mmol/L)  Date Value  06/11/2016 19  04/14/2016 28  01/19/2016 24   BUN (mg/dL)  Date Value  06/11/2016 21  04/14/2016 16  01/19/2016 16  08/24/2015 20  02/21/2015 15  07/30/2014 14   CREATININE, SER (mg/dL)  Date Value  06/11/2016 1.28*  04/14/2016 1.21*  01/19/2016 1.18*       Assessment:    Diabetes Mellitus type II, under poor control. A1C above goal <7% BP above goal <140/90   Plan:    1.  Rx changes: Increase Humalog 8 units with each meal. 2.  Continue Lantus 50 units daily. 3.  Continue Metformin 1000mg  twice daily. 4.  Reviewed CGMS results with patient.  Explained why she cannot miss any dose of insulin.  Counseled on complications of uncontrolled DM. 5.  Exercise goal is 30-45 minutes 5 x week. 6.  BP slightly above target <140/90.  She missed dose of enalapril last night.  She frequently misses evening doses of medication.   7.  Counseled at length on importance of taking all RX as prescribed.  Explained her high risk for CV complications, renal failure, blindness due to non-adherence of medication. 8.  RTC in 1 month

## 2016-06-14 NOTE — Patient Instructions (Signed)
Continue Lantus 50 units every morning. Increase Humalog 8 units with each meal.   Cannot miss any doses of insulin.

## 2016-06-15 ENCOUNTER — Other Ambulatory Visit: Payer: Self-pay | Admitting: Nurse Practitioner

## 2016-06-16 DIAGNOSIS — Z961 Presence of intraocular lens: Secondary | ICD-10-CM | POA: Diagnosis not present

## 2016-06-16 DIAGNOSIS — E113491 Type 2 diabetes mellitus with severe nonproliferative diabetic retinopathy without macular edema, right eye: Secondary | ICD-10-CM | POA: Diagnosis not present

## 2016-06-16 DIAGNOSIS — E113552 Type 2 diabetes mellitus with stable proliferative diabetic retinopathy, left eye: Secondary | ICD-10-CM | POA: Diagnosis not present

## 2016-06-23 ENCOUNTER — Other Ambulatory Visit: Payer: Self-pay | Admitting: *Deleted

## 2016-06-23 ENCOUNTER — Other Ambulatory Visit: Payer: Self-pay | Admitting: Internal Medicine

## 2016-07-05 ENCOUNTER — Other Ambulatory Visit: Payer: Self-pay

## 2016-07-05 DIAGNOSIS — N186 End stage renal disease: Principal | ICD-10-CM

## 2016-07-05 DIAGNOSIS — E1122 Type 2 diabetes mellitus with diabetic chronic kidney disease: Secondary | ICD-10-CM

## 2016-07-05 DIAGNOSIS — Z794 Long term (current) use of insulin: Secondary | ICD-10-CM

## 2016-07-05 DIAGNOSIS — N183 Chronic kidney disease, stage 3 unspecified: Secondary | ICD-10-CM

## 2016-07-07 ENCOUNTER — Other Ambulatory Visit: Payer: Medicare Other

## 2016-07-07 DIAGNOSIS — N183 Chronic kidney disease, stage 3 unspecified: Secondary | ICD-10-CM

## 2016-07-07 DIAGNOSIS — E1122 Type 2 diabetes mellitus with diabetic chronic kidney disease: Secondary | ICD-10-CM

## 2016-07-07 DIAGNOSIS — Z794 Long term (current) use of insulin: Secondary | ICD-10-CM | POA: Diagnosis not present

## 2016-07-07 DIAGNOSIS — N186 End stage renal disease: Principal | ICD-10-CM

## 2016-07-07 LAB — COMPREHENSIVE METABOLIC PANEL
ALT: 8 U/L (ref 6–29)
AST: 12 U/L (ref 10–35)
Albumin: 3.7 g/dL (ref 3.6–5.1)
Alkaline Phosphatase: 67 U/L (ref 33–130)
BUN: 19 mg/dL (ref 7–25)
CO2: 24 mmol/L (ref 20–31)
Calcium: 9.1 mg/dL (ref 8.6–10.4)
Chloride: 106 mmol/L (ref 98–110)
Creat: 1.49 mg/dL — ABNORMAL HIGH (ref 0.60–0.93)
Glucose, Bld: 151 mg/dL — ABNORMAL HIGH (ref 65–99)
Potassium: 3.9 mmol/L (ref 3.5–5.3)
Sodium: 138 mmol/L (ref 135–146)
Total Bilirubin: 0.4 mg/dL (ref 0.2–1.2)
Total Protein: 6.2 g/dL (ref 6.1–8.1)

## 2016-07-07 LAB — SPECIMEN STATUS REPORT

## 2016-07-08 LAB — HEMOGLOBIN A1C
Hgb A1c MFr Bld: 9 % — ABNORMAL HIGH (ref <5.7)
Mean Plasma Glucose: 212 mg/dL

## 2016-07-12 ENCOUNTER — Ambulatory Visit (INDEPENDENT_AMBULATORY_CARE_PROVIDER_SITE_OTHER): Payer: Medicare Other | Admitting: Pharmacotherapy

## 2016-07-12 ENCOUNTER — Encounter: Payer: Self-pay | Admitting: Pharmacotherapy

## 2016-07-12 VITALS — BP 132/64 | HR 92 | Temp 98.4°F | Ht 65.0 in | Wt 145.6 lb

## 2016-07-12 DIAGNOSIS — N183 Chronic kidney disease, stage 3 (moderate): Secondary | ICD-10-CM | POA: Diagnosis not present

## 2016-07-12 DIAGNOSIS — E1165 Type 2 diabetes mellitus with hyperglycemia: Secondary | ICD-10-CM | POA: Diagnosis not present

## 2016-07-12 DIAGNOSIS — IMO0002 Reserved for concepts with insufficient information to code with codable children: Secondary | ICD-10-CM

## 2016-07-12 DIAGNOSIS — E1122 Type 2 diabetes mellitus with diabetic chronic kidney disease: Secondary | ICD-10-CM | POA: Diagnosis not present

## 2016-07-12 DIAGNOSIS — N182 Chronic kidney disease, stage 2 (mild): Secondary | ICD-10-CM

## 2016-07-12 DIAGNOSIS — Z794 Long term (current) use of insulin: Secondary | ICD-10-CM

## 2016-07-12 DIAGNOSIS — I10 Essential (primary) hypertension: Secondary | ICD-10-CM | POA: Diagnosis not present

## 2016-07-12 NOTE — Patient Instructions (Signed)
Keep up the good work!  Stay motivated.

## 2016-07-12 NOTE — Progress Notes (Signed)
  Subjective:    Monique Cabrera is a 81 y.o. African American  female who presents for follow-up of Type 2 diabetes mellitus.   A1C now 9.0% (was 11%) FBG improved to 152m/dl (was 3382mdl) EGFR:  32 ml/min  [facility]  She feels better.  Not as much pain. Most BG are <20070ml now Hypoglycemia x 2  Trying to make healthy food choices.  Still skipping meals.  Struggles with breakfast choices. Wears glasses. Denies problems with vision.  Went to retinologist recently.  He wants to do a laser treatment for right eye. No peripheral edema. Denies problems with feet. Dry skin improved. Nocturia 1-2 times per night. No dysuria. Staying well hydrated.  Lantus 50 units daily. She is doing her Humalog 8 units with each meal she eats.   Review of Systems A comprehensive review of systems was negative except for: Eyes: positive for contacts/glasses Genitourinary: positive for nocturia    Objective:    BP 132/64 mmHg  Pulse 92  Temp(Src) 98.4 F (36.9 C)  Ht 5' 5" (1.651 m)  Wt 145 lb 9.6 oz (66.044 kg)  BMI 24.23 kg/m2  General:  alert, cooperative and no distress  Oropharynx: normal findings: lips normal without lesions and gums healthy   Eyes:  negative findings: lids and lashes normal and conjunctivae and sclerae normal   Ears:  external ears normal        Lung: clear to auscultation bilaterally  Heart:  regular rate and rhythm     Extremities: extremities normal, atraumatic, no cyanosis or edema  Skin: dry     Neuro: mental status, speech normal, alert and oriented x3 and gait and station normal   Lab Review GLUCOSE (mg/dL)  Date Value  06/11/2016 331*  04/14/2016 123*  01/19/2016 321*   GLUCOSE, BLD (mg/dL)  Date Value  07/07/2016 151*  08/24/2015 108*  02/21/2015 94   CO2 (mmol/L)  Date Value  07/07/2016 24  06/11/2016 19  04/14/2016 28   BUN (mg/dL)  Date Value  07/07/2016 19  06/11/2016 21  04/14/2016 16   01/19/2016 16  08/24/2015 20  02/21/2015 15   CREAT (mg/dL)  Date Value  07/07/2016 1.49*   CREATININE, SER (mg/dL)  Date Value  06/11/2016 1.28*  04/14/2016 1.21*  01/19/2016 1.18*       Assessment:    Diabetes Mellitus type II, under fair control. A1C above target <8% - but much improved - down 2% from last check. BP at goal <140/90 Kidney function stable.   Plan:    1.  Rx changes: none 2.  Continue Lantus 50 units daily. 3.  Continue Humalog 8 units with each meal. 4.  Continue Metformin 1000m96mD. 5.  Counseled on nutrition goals. 6.  Praised improved exercise efforts.  Goal is for 30-45 minutes 5 x week. 7.  BP at goal <140/90

## 2016-07-13 LAB — BASIC METABOLIC PANEL
BUN/Creatinine Ratio: 16 (ref 12–28)
BUN: 21 mg/dL (ref 8–27)
CO2: 19 mmol/L (ref 18–29)
Calcium: 9.8 mg/dL (ref 8.7–10.3)
Chloride: 98 mmol/L (ref 96–106)
Creatinine, Ser: 1.28 mg/dL — ABNORMAL HIGH (ref 0.57–1.00)
GFR calc Af Amer: 46 mL/min/{1.73_m2} — ABNORMAL LOW (ref >59)
GFR calc non Af Amer: 40 mL/min/{1.73_m2} — ABNORMAL LOW (ref >59)
Glucose: 331 mg/dL — ABNORMAL HIGH (ref 65–99)
Potassium: 4.2 mmol/L (ref 3.5–5.2)
Sodium: 136 mmol/L (ref 134–144)

## 2016-07-15 ENCOUNTER — Encounter: Payer: Self-pay | Admitting: *Deleted

## 2016-07-22 DIAGNOSIS — Z961 Presence of intraocular lens: Secondary | ICD-10-CM | POA: Diagnosis not present

## 2016-07-29 DIAGNOSIS — E161 Other hypoglycemia: Secondary | ICD-10-CM | POA: Diagnosis not present

## 2016-08-25 DIAGNOSIS — E113491 Type 2 diabetes mellitus with severe nonproliferative diabetic retinopathy without macular edema, right eye: Secondary | ICD-10-CM | POA: Diagnosis not present

## 2016-09-13 ENCOUNTER — Encounter: Payer: Self-pay | Admitting: Internal Medicine

## 2016-09-13 ENCOUNTER — Ambulatory Visit (INDEPENDENT_AMBULATORY_CARE_PROVIDER_SITE_OTHER): Payer: Medicare Other | Admitting: Internal Medicine

## 2016-09-13 VITALS — BP 150/80 | HR 72 | Temp 98.0°F | Wt 144.0 lb

## 2016-09-13 DIAGNOSIS — I10 Essential (primary) hypertension: Secondary | ICD-10-CM

## 2016-09-13 DIAGNOSIS — N182 Chronic kidney disease, stage 2 (mild): Secondary | ICD-10-CM

## 2016-09-13 DIAGNOSIS — N183 Chronic kidney disease, stage 3 unspecified: Secondary | ICD-10-CM

## 2016-09-13 DIAGNOSIS — Z794 Long term (current) use of insulin: Secondary | ICD-10-CM

## 2016-09-13 DIAGNOSIS — E11649 Type 2 diabetes mellitus with hypoglycemia without coma: Secondary | ICD-10-CM

## 2016-09-13 DIAGNOSIS — E1122 Type 2 diabetes mellitus with diabetic chronic kidney disease: Secondary | ICD-10-CM | POA: Diagnosis not present

## 2016-09-13 DIAGNOSIS — E1165 Type 2 diabetes mellitus with hyperglycemia: Secondary | ICD-10-CM

## 2016-09-13 DIAGNOSIS — IMO0002 Reserved for concepts with insufficient information to code with codable children: Secondary | ICD-10-CM

## 2016-09-13 DIAGNOSIS — Z23 Encounter for immunization: Secondary | ICD-10-CM | POA: Diagnosis not present
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
## 2016-09-13 NOTE — Patient Instructions (Signed)
Please bring ALL of your medications next visit to be reviewed.

## 2016-09-16 ENCOUNTER — Telehealth: Payer: Self-pay

## 2016-09-16 NOTE — Telephone Encounter (Signed)
Called patient to let her know that Dr. Mariea Clonts completed the [facility] forms. There is a page that her eye doctor needs to complete, asked who he is, Dr. Gershon Crane. Took forms to Dr. Kellie Moor office, they will review her records, and call her if she needs to come in. Will call her when they complete it.

## 2016-10-07 ENCOUNTER — Other Ambulatory Visit: Payer: Medicare Other

## 2016-10-07 DIAGNOSIS — E1165 Type 2 diabetes mellitus with hyperglycemia: Principal | ICD-10-CM

## 2016-10-07 DIAGNOSIS — E1122 Type 2 diabetes mellitus with diabetic chronic kidney disease: Secondary | ICD-10-CM

## 2016-10-07 DIAGNOSIS — N182 Chronic kidney disease, stage 2 (mild): Secondary | ICD-10-CM

## 2016-10-07 DIAGNOSIS — N183 Chronic kidney disease, stage 3 (moderate): Principal | ICD-10-CM

## 2016-10-07 DIAGNOSIS — IMO0002 Reserved for concepts with insufficient information to code with codable children: Secondary | ICD-10-CM

## 2016-10-07 DIAGNOSIS — Z794 Long term (current) use of insulin: Principal | ICD-10-CM

## 2016-10-07 LAB — COMPLETE METABOLIC PANEL WITHOUT GFR
ALT: 8 U/L (ref 6–29)
AST: 13 U/L (ref 10–35)
Albumin: 3.8 g/dL (ref 3.6–5.1)
Alkaline Phosphatase: 73 U/L (ref 33–130)
BUN: 30 mg/dL — ABNORMAL HIGH (ref 7–25)
CO2: 21 mmol/L (ref 20–31)
Calcium: 9.6 mg/dL (ref 8.6–10.4)
Chloride: 101 mmol/L (ref 98–110)
Creat: 1.4 mg/dL — ABNORMAL HIGH (ref 0.60–0.93)
GFR, Est African American: 41 mL/min — ABNORMAL LOW
GFR, Est Non African American: 36 mL/min — ABNORMAL LOW
Glucose, Bld: 282 mg/dL — ABNORMAL HIGH (ref 65–99)
Potassium: 3.8 mmol/L (ref 3.5–5.3)
Sodium: 134 mmol/L — ABNORMAL LOW (ref 135–146)
Total Bilirubin: 0.4 mg/dL (ref 0.2–1.2)
Total Protein: 6.5 g/dL (ref 6.1–8.1)

## 2016-10-08 LAB — HEMOGLOBIN A1C
Hgb A1c MFr Bld: 9.5 % — ABNORMAL HIGH (ref <5.7)
Mean Plasma Glucose: 226 mg/dL

## 2016-10-11 ENCOUNTER — Encounter: Payer: Self-pay | Admitting: Pharmacotherapy

## 2016-10-11 ENCOUNTER — Ambulatory Visit (INDEPENDENT_AMBULATORY_CARE_PROVIDER_SITE_OTHER): Payer: Medicare Other | Admitting: Pharmacotherapy

## 2016-10-11 VITALS — BP 130/78 | HR 80 | Temp 98.1°F | Resp 20 | Ht 65.0 in | Wt 146.4 lb

## 2016-10-11 DIAGNOSIS — IMO0002 Reserved for concepts with insufficient information to code with codable children: Secondary | ICD-10-CM

## 2016-10-11 DIAGNOSIS — Z794 Long term (current) use of insulin: Secondary | ICD-10-CM | POA: Diagnosis not present

## 2016-10-11 DIAGNOSIS — E1122 Type 2 diabetes mellitus with diabetic chronic kidney disease: Secondary | ICD-10-CM

## 2016-10-11 DIAGNOSIS — N183 Chronic kidney disease, stage 3 (moderate): Secondary | ICD-10-CM | POA: Diagnosis not present

## 2016-10-11 DIAGNOSIS — I10 Essential (primary) hypertension: Secondary | ICD-10-CM

## 2016-10-11 DIAGNOSIS — E1165 Type 2 diabetes mellitus with hyperglycemia: Secondary | ICD-10-CM

## 2016-10-11 DIAGNOSIS — N182 Chronic kidney disease, stage 2 (mild): Secondary | ICD-10-CM

## 2016-10-11 MED ORDER — INSULIN LISPRO 100 UNIT/ML (KWIKPEN)
PEN_INJECTOR | SUBCUTANEOUS | 1 refills | Status: DC
Start: 2016-10-11 — End: 2017-01-10
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
## 2016-10-11 NOTE — Patient Instructions (Signed)
Increase Humalog 8 units breakfast, 10 units lunch and supper

## 2016-12-09 ENCOUNTER — Encounter: Payer: Self-pay | Admitting: Internal Medicine

## 2016-12-09 ENCOUNTER — Ambulatory Visit (INDEPENDENT_AMBULATORY_CARE_PROVIDER_SITE_OTHER): Payer: Medicare Other | Admitting: Internal Medicine

## 2016-12-09 VITALS — BP 148/70 | HR 64 | Temp 98.0°F | Wt 147.0 lb

## 2016-12-09 DIAGNOSIS — E1122 Type 2 diabetes mellitus with diabetic chronic kidney disease: Secondary | ICD-10-CM

## 2016-12-09 DIAGNOSIS — E785 Hyperlipidemia, unspecified: Secondary | ICD-10-CM | POA: Diagnosis not present

## 2016-12-09 DIAGNOSIS — E162 Hypoglycemia, unspecified: Secondary | ICD-10-CM | POA: Diagnosis not present

## 2016-12-09 DIAGNOSIS — E1165 Type 2 diabetes mellitus with hyperglycemia: Secondary | ICD-10-CM | POA: Diagnosis not present

## 2016-12-09 DIAGNOSIS — E11319 Type 2 diabetes mellitus with unspecified diabetic retinopathy without macular edema: Secondary | ICD-10-CM | POA: Diagnosis not present

## 2016-12-09 DIAGNOSIS — N183 Chronic kidney disease, stage 3 unspecified: Secondary | ICD-10-CM

## 2016-12-09 DIAGNOSIS — Z794 Long term (current) use of insulin: Secondary | ICD-10-CM

## 2016-12-09 DIAGNOSIS — IMO0002 Reserved for concepts with insufficient information to code with codable children: Secondary | ICD-10-CM

## 2016-12-09 DIAGNOSIS — I10 Essential (primary) hypertension: Secondary | ICD-10-CM

## 2016-12-09 NOTE — Progress Notes (Signed)
Location:  [facility] clinic Provider:  Fiona Coto L. Mariea Clonts, D.O., C.M.D.  Code Status: full code Goals of Care:  Advanced Directives 12/09/2016  Does Patient Have a Medical Advance Directive? No  Would patient like information on creating a medical advance directive? No - Patient declined  Pre-existing out of facility DNR order (yellow form or pink MOST form) -   Chief Complaint  Patient presents with  . Medical Management of Chronic Issues    3 mth follow-up    HPI: Patient is a 81 y.o. female seen today for medical management of chronic diseases.    Says she is doing well with eating and taking her insulin most of the time.  She says she is sometimes a little late eating or eats less than usual.  Still having some lows.  When she gets up in the morning, it's 225 or 2stg.  Around 2-2:30, she checks her sugar and it's low around 60.  She's had only breakfast, not lunch.  Then she eats crackers with peanut butter or sandwich.  Going on for 2-3 weeks.  She's been more active cleaning up the house.  Discussed that if she is doing more, she needs to eat more.  Talked about following a consistent schedule.  Says she was reading about what she should eat.  Says she actually ate off of that paper we gave her and she did not have a low after following it.   Continues on lantus 50 units daily.  humalog 08/05/09.  Metformin.   Last hba1c 9.5.    She took all of her bp meds this am.  bp still borderline.  Did not bring the bottle for her bp med she is supposedly taking from renal that they did not have in their list either.  She's going to bring it to [employer].     Past Medical History:  Diagnosis Date  . Acute upper respiratory infections of unspecified site   . Anemia   . Anemia, unspecified   . Atherosclerosis of native arteries of the extremities, unspecified   . Chest pain, unspecified   . Chronic kidney disease (CKD), stage II (mild)   . Diabetes mellitus   . Diarrhea   . Disorder of bone and  cartilage, unspecified   . DM (diabetes mellitus) type II controlled with renal manifestation ([place])   . Herpes zoster with other nervous system complications(053.19)   . Hypercalcemia   . Hypertension   . Hypertensive renal disease, benign   . Nonspecific reaction to tuberculin skin test without active tuberculosis(795.51)   . Nonspecific tuberculin test reaction   . Other and unspecified hyperlipidemia   . Pain in joint, lower leg   . Peripheral arterial disease ([place])   . Postherpetic neuralgia   . Proteinuria   . Type II or unspecified type diabetes mellitus with renal manifestations, not stated as uncontrolled(250.40)   . Type II or unspecified type diabetes mellitus with renal manifestations, uncontrolled(250.42)   . Unspecified disorder of kidney and ureter   . Unspecified essential hypertension     Past Surgical History:  Procedure Laterality Date  . hysterectomy    . INCISION AND DRAINAGE Left 05/27/14   sebacous cyst, ear  . removal of cyst from hand    . removal of tumor from foot    . TONSILLECTOMY      Allergies  Allergen Reactions  . Invokana [Canagliflozin] Other (See Comments)    Vagina itching and swelling Vaginal Itching and irritation   .  Jardiance [Empagliflozin] Itching and Other (See Comments)    Vaginal itching and swelling      Medication List       Accurate as of 12/09/16  8:52 AM. Always use your most recent med list.          amLODipine 10 MG tablet Commonly known as:  NORVASC TAKE ONE TABLET BY MOUTH DAILY FOR HIGH BLOOD PRESSURE   aspirin 81 MG chewable tablet Chew 81 mg by mouth daily. Reported on 05/31/2016   B-D ULTRAFINE III SHORT PEN 31G X 8 MM Misc Generic drug:  Insulin Pen Needle USE WITH LANTUS EVERY DAY FOR BLOOD SUGAR   carvedilol 25 MG tablet Commonly known as:  COREG Take one tablet by mouth twice daily with a meal   clopidogrel 75 MG tablet Commonly known as:  PLAVIX Take one tablet by mouth once daily   glucose  blood test strip Commonly known as:  ONETOUCH VERIO Use as instructed   insulin lispro 100 UNIT/ML KiwkPen Commonly known as:  HUMALOG KWIKPEN 8 units breakfast, 10 units lunch & supper   LANTUS SOLOSTAR 100 UNIT/ML Solostar Pen Generic drug:  Insulin Glargine INJECT 44 UNITS UNDER THE SKIN DAILY FOR DIABETES.   lovastatin 20 MG tablet Commonly known as:  MEVACOR Take 1 tablet (20 mg total) by mouth at bedtime. Take one tablet by mouth every day for cholesterol   metFORMIN 500 MG tablet Commonly known as:  GLUCOPHAGE TAKE 2 TABLETS BY MOUTH TWICE A DAY WITH MEALS   pantoprazole 40 MG tablet Commonly known as:  PROTONIX Take 1 tablet (40 mg total) by mouth daily.   valsartan-hydrochlorothiazide 320-25 MG tablet Commonly known as:  DIOVAN-HCT Take one tablet by mouth once daily       Review of Systems:  Review of Systems  Constitutional: Negative for chills and fever.  Eyes: Negative for blurred vision.       Glasses  Respiratory: Negative for cough and shortness of breath.   Cardiovascular: Negative for chest pain.  Gastrointestinal: Negative for abdominal pain, blood in stool, constipation and melena.  Genitourinary: Negative for dysuria.  Musculoskeletal: Negative for falls.  Skin: Negative for itching and rash.  Neurological: Positive for sensory change.  Endo/Heme/Allergies:       Hypoglycemia, diabetes  Psychiatric/Behavioral: Positive for memory loss.    Health Maintenance  Topic Date Due  . OPHTHALMOLOGY EXAM  08/03/2016  . HEMOGLOBIN A1C  04/07/2017  . FOOT EXAM  06/11/2017  . INFLUENZA VACCINE  Completed  . DEXA SCAN  Completed  . PNA vac Low Risk Adult  Completed    Physical Exam: Vitals:   12/09/16 0838  BP: (!) 148/70  Pulse: 64  Temp: 98 F (36.7 C)  TempSrc: Oral  SpO2: 98%  Weight: 147 lb (66.7 kg)   Body mass index is 24.46 kg/m. Physical Exam  Constitutional: She is oriented to person, place, and time. She appears well-developed  and well-nourished. No distress.  Cardiovascular: Normal rate, regular rhythm and normal heart sounds.   Pulmonary/Chest: Effort normal and breath sounds normal. No respiratory distress.  Abdominal: Bowel sounds are normal.  Musculoskeletal: Normal range of motion.  Neurological: She is alert and oriented to person, place, and time.  Seems to forget our discussions b/w visits   Skin: Skin is warm and dry.  Psychiatric: She has a normal mood and affect.  A little anxious and jittery    Labs reviewed: Basic Metabolic Panel:  Recent Labs  06/11/16 1025  07/07/16 0920 10/07/16 0949  NA 136 138 134*  K 4.2 3.9 3.8  CL 98 106 101  CO2 19 24 21   GLUCOSE 331* 151* 282*  BUN 21 19 30*  CREATININE 1.28* 1.49* 1.40*  CALCIUM 9.8 9.1 9.6   Liver Function Tests:  Recent Labs  04/14/16 0922 07/07/16 0920 10/07/16 0949  AST 16 12 13   ALT 6 8 8   ALKPHOS 71 67 73  BILITOT 0.4 0.4 0.4  PROT 7.1 6.2 6.5  ALBUMIN 4.1 3.7 3.8   No results for input(s): LIPASE, AMYLASE in the last 8760 hours. No results for input(s): AMMONIA in the last 8760 hours. CBC: No results for input(s): WBC, NEUTROABS, HGB, HCT, MCV, PLT in the last 8760 hours. Lipid Panel: No results for input(s): CHOL, HDL, LDLCALC, TRIG, CHOLHDL, LDLDIRECT in the last 8760 hours. Lab Results  Component Value Date   HGBA1C 9.5 (H) 10/07/2016    Assessment/Plan 1. Uncontrolled type 2 diabetes mellitus with stage 3 chronic kidney disease, with long-term current use of insulin (HCC) Lab Results  Component Value Date   HGBA1C 9.5 (H) 10/07/2016  -cont same insulin regimen -problem is that she does not eat right--skips meals and only eats small amounts of not recommended foods -counseled again about eating a good breakfast--she did notice that she felt much better yesterday when she did that  2. Hyperlipidemia with target LDL less than 100 - Lab Results  Component Value Date   CHOL 204 (H) 11/24/2015   HDL 53  11/24/2015   LDLCALC 125 (H) 11/24/2015   TRIG 131 11/24/2015   CHOLHDL 3.8 11/24/2015  -LDL still elevated last time--needs recheck  3. Diabetic retinopathy of both eyes associated with type 2 diabetes mellitus, macular edema presence unspecified, unspecified retinopathy severity ([place]) -f/u with Dr. Gershon Crane in July as planned  4. Hypoglycemia -ongoing, still not eating right--reinforced verbally and on paper today and noted correlations  5. Essential hypertension, benign -bp above goal, but anxious and jittery b/c she does not do what she's instructed so suspect bp at home at goal  Labs/tests ordered: No orders of the defined types were placed in this encounter. Has labs with cathey and AWV with Alisa coming up See me in 3 mos  Next appt:  01/06/2017   Geniene List L. Ahnaf Caponi, D.O. [facility] [address], [place] [address] Cell Phone (Mon-Fri 8am-5pm):  [phone] On Call:  [phone] & follow prompts after 5pm & weekends Office Phone:  [phone] Office Fax:  [phone]

## 2016-12-09 NOTE — Patient Instructions (Signed)
Please eat your breakfast after checking your sugar in the morning.    Be sure to eat something every 3-4 hours.  You do not want to wait until your sugar is low because it is dangerous.  Your sugar can go low and you may not have symptoms.

## 2016-12-13 ENCOUNTER — Ambulatory Visit: Payer: Medicare Other | Admitting: Internal Medicine

## 2017-01-06 ENCOUNTER — Ambulatory Visit (INDEPENDENT_AMBULATORY_CARE_PROVIDER_SITE_OTHER): Payer: Medicare Other

## 2017-01-06 ENCOUNTER — Other Ambulatory Visit: Payer: Medicare Other

## 2017-01-06 VITALS — BP 156/78 | HR 67 | Temp 98.1°F | Ht 65.0 in | Wt 147.2 lb

## 2017-01-06 DIAGNOSIS — Z0000 Encounter for general adult medical examination without abnormal findings: Secondary | ICD-10-CM

## 2017-01-06 DIAGNOSIS — N183 Chronic kidney disease, stage 3 (moderate): Principal | ICD-10-CM

## 2017-01-06 DIAGNOSIS — E1122 Type 2 diabetes mellitus with diabetic chronic kidney disease: Secondary | ICD-10-CM

## 2017-01-06 DIAGNOSIS — E1165 Type 2 diabetes mellitus with hyperglycemia: Principal | ICD-10-CM

## 2017-01-06 DIAGNOSIS — IMO0002 Reserved for concepts with insufficient information to code with codable children: Secondary | ICD-10-CM

## 2017-01-06 DIAGNOSIS — Z794 Long term (current) use of insulin: Principal | ICD-10-CM

## 2017-01-06 LAB — COMPLETE METABOLIC PANEL WITH GFR
ALT: 7 U/L (ref 6–29)
AST: 14 U/L (ref 10–35)
Albumin: 3.7 g/dL (ref 3.6–5.1)
Alkaline Phosphatase: 68 U/L (ref 33–130)
BUN: 28 mg/dL — ABNORMAL HIGH (ref 7–25)
CO2: 24 mmol/L (ref 20–31)
Calcium: 9.8 mg/dL (ref 8.6–10.4)
Chloride: 106 mmol/L (ref 98–110)
Creat: 1.49 mg/dL — ABNORMAL HIGH (ref 0.60–0.93)
GFR, Est African American: 38 mL/min — ABNORMAL LOW (ref >=60)
GFR, Est Non African American: 33 mL/min — ABNORMAL LOW (ref >=60)
Glucose, Bld: 148 mg/dL — ABNORMAL HIGH (ref 65–99)
Potassium: 4.2 mmol/L (ref 3.5–5.3)
Sodium: 138 mmol/L (ref 135–146)
Total Bilirubin: 0.3 mg/dL (ref 0.2–1.2)
Total Protein: 6.6 g/dL (ref 6.1–8.1)

## 2017-01-06 NOTE — Progress Notes (Signed)
Subjective:   Monique Cabrera is a 82 y.o. female who presents for an Initial Medicare Annual Wellness Visit.  Review of Systems     Cardiac Risk Factors include: advanced age (>45men, >21 women);diabetes mellitus;dyslipidemia;hypertension;sedentary lifestyle     Objective:    Today's Vitals   01/06/17 0929  BP: (!) 156/78  Pulse: 67  Temp: 98.1 F (36.7 C)  TempSrc: Oral  SpO2: 99%  Weight: 147 lb 3.2 oz (66.8 kg)  Height: 5\' 5"  (1.651 m)   Body mass index is 24.5 kg/m.   Current Medications (verified) Outpatient Encounter Prescriptions as of 01/06/2017  Medication Sig  . amLODipine (NORVASC) 10 MG tablet TAKE ONE TABLET BY MOUTH DAILY FOR HIGH BLOOD PRESSURE  . aspirin 81 MG chewable tablet Chew 81 mg by mouth daily. Reported on 05/31/2016  . B-D ULTRAFINE III SHORT PEN 31G X 8 MM MISC USE WITH LANTUS EVERY DAY FOR BLOOD SUGAR  . carvedilol (COREG) 25 MG tablet Take one tablet by mouth twice daily with a meal  . clopidogrel (PLAVIX) 75 MG tablet Take one tablet by mouth once daily  . glucose blood (ONETOUCH VERIO) test strip Use as instructed  . insulin lispro (HUMALOG KWIKPEN) 100 UNIT/ML KiwkPen 8 units breakfast, 10 units lunch & supper  . LANTUS SOLOSTAR 100 UNIT/ML Solostar Pen INJECT 44 UNITS UNDER THE SKIN DAILY FOR DIABETES.  Marland Kitchen lovastatin (MEVACOR) 20 MG tablet Take 1 tablet (20 mg total) by mouth at bedtime. Take one tablet by mouth every day for cholesterol  . metFORMIN (GLUCOPHAGE) 500 MG tablet TAKE 2 TABLETS BY MOUTH TWICE A DAY WITH MEALS  . pantoprazole (PROTONIX) 40 MG tablet Take 1 tablet (40 mg total) by mouth daily.  . valsartan-hydrochlorothiazide (DIOVAN-HCT) 320-25 MG tablet Take one tablet by mouth once daily   No facility-administered encounter medications on file as of 01/06/2017.     Allergies (verified) Invokana [canagliflozin] and Jardiance [empagliflozin]   History: Past Medical History:  Diagnosis Date  . Acute upper respiratory  infections of unspecified site   . Anemia   . Anemia, unspecified   . Atherosclerosis of native arteries of the extremities, unspecified   . Chest pain, unspecified   . Chronic kidney disease (CKD), stage II (mild)   . Diabetes mellitus   . Diarrhea   . Disorder of bone and cartilage, unspecified   . DM (diabetes mellitus) type II controlled with renal manifestation ([place])   . Herpes zoster with other nervous system complications(053.19)   . Hypercalcemia   . Hypertension   . Hypertensive renal disease, benign   . Nonspecific reaction to tuberculin skin test without active tuberculosis(795.51)   . Nonspecific tuberculin test reaction   . Other and unspecified hyperlipidemia   . Pain in joint, lower leg   . Peripheral arterial disease ([place])   . Postherpetic neuralgia   . Proteinuria   . Type II or unspecified type diabetes mellitus with renal manifestations, not stated as uncontrolled(250.40)   . Type II or unspecified type diabetes mellitus with renal manifestations, uncontrolled(250.42)   . Unspecified disorder of kidney and ureter   . Unspecified essential hypertension    Past Surgical History:  Procedure Laterality Date  . hysterectomy    . INCISION AND DRAINAGE Left 05/27/14   sebacous cyst, ear  . removal of cyst from hand    . removal of tumor from foot    . TONSILLECTOMY     Family History  Problem Relation Age of Onset  .  Diabetes Mother   . Diabetes Father   . Diabetes Sister   . Diabetes Sister    Social History   Occupational History  . Not on file.   Social History Main Topics  . Smoking status: Former Smoker    Types: Cigarettes  . Smokeless tobacco: Never Used     Comment: Quit about age 45   . Alcohol use No  . Drug use: No  . Sexual activity: No    Tobacco Counseling Counseling given: No   Activities of Daily Living In your present state of health, do you have any difficulty performing the following activities: 01/06/2017 04/29/2016  Hearing? N  N  Vision? N N  Difficulty concentrating or making decisions? N N  Walking or climbing stairs? N N  Dressing or bathing? N N  Doing errands, shopping? Y N  Preparing Food and eating ? N -  Using the Toilet? N -  In the past six months, have you accidently leaked urine? N -  Do you have problems with loss of bowel control? N -  Managing your Medications? N -  Managing your Finances? N -  Housekeeping or managing your Housekeeping? N -  Some recent data might be hidden    Immunizations and Health Maintenance Immunization History  Administered Date(s) Administered  . Influenza,inj,Quad PF,36+ Mos 09/24/2013, 09/19/2014, 09/13/2016  . Influenza-Unspecified 11/22/2009, 10/01/2011, 09/10/2015  . PPD Test 01/12/2010  . Pneumococcal Conjugate-13 11/24/2015  . Pneumococcal Polysaccharide-23 12/27/2005   Health Maintenance Due  Topic Date Due  . OPHTHALMOLOGY EXAM  08/03/2016    Patient Care Team: [name], DO as PCP - General (Geriatric Medicine) Estanislado Emms, MD as Consulting Physician (Nephrology) Adrian Prows, MD as Consulting Physician (Cardiology)  Indicate any recent Medical Services you may have received from other than [facility] providers in the past year (date may be approximate).     Assessment:   This is a routine wellness examination for [name].   Hearing/Vision screen Hearing Screening Comments: Pt states she has never had a hearing screen. Denies any decreases in hearing. Unable to do office hearing screen (battery died).  Vision Screening Comments: Last eye exam done in  Aug. 2017 with Dr. Gershon Crane and with Dr. Zadie Rhine. Hx of cataracts.   Dietary issues and exercise activities discussed: Current Exercise Habits: The patient does not participate in regular exercise at present  Goals    . Weight (lb) < 135 lb (61.2 kg)          Starting 01/06/2017, I will attempt to decrease my current weight of 147 lb to my goal weight of 135 lb.       Depression Screen PHQ  2/9 Scores 01/06/2017 12/09/2016 09/13/2016 04/29/2016 04/19/2016 11/18/2014 09/19/2014  PHQ - 2 Score 0 0 0 0 0 0 0    Fall Risk Fall Risk  01/06/2017 12/09/2016 09/13/2016 06/14/2016 05/31/2016  Falls in the past year? No No No No No  Number falls in past yr: - - - - -  Injury with Fall? - - - - -  Risk for fall due to : - - - - -    Cognitive Function: MMSE - Mini Mental State Exam 01/06/2017  Orientation to time 5  Orientation to Place 5  Registration 3  Attention/ Calculation 5  Recall 3  Language- name 2 objects 2  Language- repeat 1  Language- follow 3 step command 3  Language- read & follow direction 1  Write a sentence 1  Copy design 1  Total score 30        Screening Tests Health Maintenance  Topic Date Due  . OPHTHALMOLOGY EXAM  08/03/2016  . HEMOGLOBIN A1C  04/07/2017  . FOOT EXAM  06/11/2017  . INFLUENZA VACCINE  Completed  . DEXA SCAN  Completed  . PNA vac Low Risk Adult  Completed      Plan:    I have personally reviewed and addressed the Medicare Annual Wellness questionnaire and have noted the following in the patient's chart:  A. Medical and social history B. Use of alcohol, tobacco or illicit drugs  C. Current medications and supplements D. Functional ability and status E.  Nutritional status F.  Physical activity G. Advance directives H. List of other physicians I.  Hospitalizations, surgeries, and ER visits in previous 12 months J.  [place] to include hearing, vision, cognitive, depression L. Referrals and appointments - none  In addition, I have reviewed and discussed with patient certain preventive protocols, quality metrics, and best practice recommendations. A written personalized care plan for preventive services as well as general preventive health recommendations were provided to patient.  See attached scanned questionnaire for additional information.   Signed,   Allyn Kenner, LPN Health Advisor

## 2017-01-06 NOTE — Progress Notes (Signed)
   I reviewed health advisor's note, was available for consultation and agree with the assessment and plan as written.  Pt tends to be nonadherent with regular meals and mealtime insulin leading to hypoglycemia and worsened cognitive deficits (did not expect 30/30) considering these problems.  Will monitor.  Gino Garrabrant L. Jeanie Mccard, D.O. [facility] [address], [place] [address] Cell Phone (Mon-Fri 8am-5pm):  [phone] On Call:  [phone] & follow prompts after 5pm & weekends Office Phone:  [phone] Office Fax:  [phone]  Quick Notes   Health Maintenance:  UTD on health maintenance    Abnormal Screen: None; MMSE-30/30 Passed clock test    Patient Concerns:   None    Nurse Concerns:   None

## 2017-01-06 NOTE — Patient Instructions (Addendum)
Ms. Runkles , Thank you for taking time to come for your Medicare Wellness Visit. I appreciate your ongoing commitment to your health goals. Please review the following plan we discussed and let me know if I can assist you in the future.   These are the goals we discussed: Goals    . Weight (lb) < 135 lb (61.2 kg)          Starting 01/06/2017, I will attempt to decrease my current weight of 147 lb to my goal weight of 135 lb.        This is a list of the screening recommended for you and due dates:  Health Maintenance  Topic Date Due  . Eye exam for diabetics  08/03/2016  . Hemoglobin A1C  04/07/2017  . Complete foot exam   06/11/2017  . Flu Shot  Completed  . DEXA scan (bone density measurement)  Completed  . Pneumonia vaccines  Completed  Preventive Care for Adults  A healthy lifestyle and preventive care can promote health and wellness. Preventive health guidelines for adults include the following key practices.  . A routine yearly physical is a good way to check with your health care provider about your health and preventive screening. It is a chance to share any concerns and updates on your health and to receive a thorough exam.  . Visit your dentist for a routine exam and preventive care every 6 months. Brush your teeth twice a day and floss once a day. Good oral hygiene prevents tooth decay and gum disease.  . The frequency of eye exams is based on your age, health, family medical history, use  of contact lenses, and other factors. Follow your health care provider's ecommendations for frequency of eye exams.  . Eat a healthy diet. Foods like vegetables, fruits, whole grains, low-fat dairy products, and lean protein foods contain the nutrients you need without too many calories. Decrease your intake of foods high in solid fats, added sugars, and salt. Eat the right amount of calories for you. Get information about a proper diet from your health care provider, if necessary.  .  Regular physical exercise is one of the most important things you can do for your health. Most adults should get at least 150 minutes of moderate-intensity exercise (any activity that increases your heart rate and causes you to sweat) each week. In addition, most adults need muscle-strengthening exercises on 2 or more days a week.  Silver Sneakers may be a benefit available to you. To determine eligibility, you may visit the website: www.silversneakers.com or contact program at 901-617-5054 Mon-Fri between 8AM-8PM.   . Maintain a healthy weight. The body mass index (BMI) is a screening tool to identify possible weight problems. It provides an estimate of body fat based on height and weight. Your health care provider can find your BMI and can help you achieve or maintain a healthy weight.   For adults 20 years and older: ? A BMI below 18.5 is considered underweight. ? A BMI of 18.5 to 24.9 is normal. ? A BMI of 25 to 29.9 is considered overweight. ? A BMI of 30 and above is considered obese.   . Maintain normal blood lipids and cholesterol levels by exercising and minimizing your intake of saturated fat. Eat a balanced diet with plenty of fruit and vegetables. Blood tests for lipids and cholesterol should begin at age 47 and be repeated every 5 years. If your lipid or cholesterol levels are high, you are  over 80, or you are at high risk for heart disease, you may need your cholesterol levels checked more frequently. Ongoing high lipid and cholesterol levels should be treated with medicines if diet and exercise are not working.  . If you smoke, find out from your health care provider how to quit. If you do not use tobacco, please do not start.  . If you choose to drink alcohol, please do not consume more than 2 drinks per day. One drink is considered to be 12 ounces (355 mL) of beer, 5 ounces (148 mL) of wine, or 1.5 ounces (44 mL) of liquor.  . If you are 17-79 years old, ask your health care  provider if you should take aspirin to prevent strokes.  . Use sunscreen. Apply sunscreen liberally and repeatedly throughout the day. You should seek shade when your shadow is shorter than you. Protect yourself by wearing long sleeves, pants, a wide-brimmed hat, and sunglasses year round, whenever you are outdoors.  . Once a month, do a whole body skin exam, using a mirror to look at the skin on your back. Tell your health care provider of new moles, moles that have irregular borders, moles that are larger than a pencil eraser, or moles that have changed in shape or color.

## 2017-01-07 ENCOUNTER — Other Ambulatory Visit: Payer: Self-pay | Admitting: Internal Medicine

## 2017-01-07 DIAGNOSIS — E1169 Type 2 diabetes mellitus with other specified complication: Secondary | ICD-10-CM

## 2017-01-07 DIAGNOSIS — E785 Hyperlipidemia, unspecified: Principal | ICD-10-CM

## 2017-01-07 LAB — HEMOGLOBIN A1C
Hgb A1c MFr Bld: 8.5 % — ABNORMAL HIGH (ref <5.7)
Mean Plasma Glucose: 197 mg/dL

## 2017-01-10 ENCOUNTER — Encounter: Payer: Self-pay | Admitting: Pharmacotherapy

## 2017-01-10 ENCOUNTER — Ambulatory Visit (INDEPENDENT_AMBULATORY_CARE_PROVIDER_SITE_OTHER): Payer: Medicare Other | Admitting: Pharmacotherapy

## 2017-01-10 VITALS — BP 130/72 | HR 67 | Ht 65.0 in | Wt 146.0 lb

## 2017-01-10 DIAGNOSIS — E1122 Type 2 diabetes mellitus with diabetic chronic kidney disease: Secondary | ICD-10-CM

## 2017-01-10 DIAGNOSIS — IMO0002 Reserved for concepts with insufficient information to code with codable children: Secondary | ICD-10-CM

## 2017-01-10 DIAGNOSIS — Z794 Long term (current) use of insulin: Secondary | ICD-10-CM

## 2017-01-10 DIAGNOSIS — E1165 Type 2 diabetes mellitus with hyperglycemia: Secondary | ICD-10-CM | POA: Diagnosis not present

## 2017-01-10 DIAGNOSIS — I10 Essential (primary) hypertension: Secondary | ICD-10-CM

## 2017-01-10 DIAGNOSIS — N183 Chronic kidney disease, stage 3 (moderate): Secondary | ICD-10-CM | POA: Diagnosis not present

## 2017-01-10 MED ORDER — INSULIN LISPRO 100 UNIT/ML (KWIKPEN): PEN_INJECTOR | SUBCUTANEOUS | 1 refills | Status: DC

## 2017-01-10 MED ORDER — INSULIN GLARGINE 100 UNIT/ML SOLOSTAR PEN: 50.0000 [IU] | PEN_INJECTOR | Freq: Every day | SUBCUTANEOUS | 7 refills | Status: DC

## 2017-01-10 NOTE — Patient Instructions (Signed)
Keep up the good work! Increase Humalog to 8 units breakfast, 12 units lunch and supper

## 2017-01-10 NOTE — Progress Notes (Signed)
  Subjective:    Monique Cabrera is a 82 y.o.African American female who presents for follow-up of Type 2 diabetes mellitus.   A1C: 8.5% (was 9.5%) She reports she is not "eating well" causing her BG to fluctuate. She doesn't like that she isn't able to drive anywhere.   Forgot to bring blood glucose meter. She says her BG in the mornings are in the 120's Her afternoon BG are too high. Lantus is 50 units daily Humalog 8 units breakfast, 10 units lunch & supper Continues metformin.  No routine exercise. Wears glasses. Has dry skin. No peripheral edema. Denies problems with feet. Nocturia 1-2 times per night. No dysuria Staying well hydrated    Review of Systems A comprehensive review of systems was negative except for: Eyes: positive for contacts/glasses Genitourinary: positive for nocturia Integument/breast: positive for dryness    Objective:    BP 130/72   Pulse 67   Ht 5\' 5"  (1.651 m)   Wt 146 lb (66.2 kg)   SpO2 98%   BMI 24.30 kg/m   General:  alert, cooperative and no distress  Oropharynx: normal findings: lips normal without lesions and gums healthy   Eyes:  negative findings: lids and lashes normal and conjunctivae and sclerae normal   Ears:  external ears normal        Lung: clear to auscultation bilaterally  Heart:  regular rate and rhythm     Extremities: extremities normal, atraumatic, no cyanosis or edema  Skin: dry     Neuro: mental status, speech normal, alert and oriented x3 and gait and station normal   Lab Review Glucose, Bld (mg/dL)  Date Value  01/06/2017 148 (H)  10/07/2016 282 (H)  07/07/2016 151 (H)   CO2 (mmol/L)  Date Value  01/06/2017 24  10/07/2016 21  07/07/2016 24   BUN (mg/dL)  Date Value  01/06/2017 28 (H)  10/07/2016 30 (H)  07/07/2016 19  06/11/2016 21  04/14/2016 16  01/19/2016 16   Creat (mg/dL)  Date Value  01/06/2017 1.49 (H)  10/07/2016 1.40 (H)  07/07/2016 1.49 (H)       Assessment:    Diabetes Mellitus type II, under fair control. A1C is down 1% - best it has been since 2016. BP at goal <140/90   Plan:    1.  Rx changes: Increase Humalog to 8 units breakfast, 12 units lunch & supper.  2.  Continue Lantus 50 units daily. 3.  Continue Metformin. 4.  Counseled on nutrition goals. 5.  Exercise goal is 30-45 minutes 5 x week. 6.  BP at goal <140/90.

## 2017-01-27 DIAGNOSIS — I639 Cerebral infarction, unspecified: Secondary | ICD-10-CM

## 2017-01-27 HISTORY — DX: Cerebral infarction, unspecified: I63.9

## 2017-02-17 ENCOUNTER — Observation Stay (HOSPITAL_COMMUNITY): Payer: Medicare Other

## 2017-02-17 ENCOUNTER — Encounter (HOSPITAL_COMMUNITY): Payer: Self-pay

## 2017-02-17 ENCOUNTER — Emergency Department (HOSPITAL_COMMUNITY): Payer: Medicare Other

## 2017-02-17 ENCOUNTER — Inpatient Hospital Stay (HOSPITAL_COMMUNITY)
Admission: EM | Admit: 2017-02-17 | Discharge: 2017-02-19 | DRG: 065 | Disposition: A | Discharge status: Home Health | Payer: Medicare Other | Attending: Internal Medicine | Admitting: Internal Medicine

## 2017-02-17 DIAGNOSIS — N183 Chronic kidney disease, stage 3 (moderate): Secondary | ICD-10-CM | POA: Diagnosis not present

## 2017-02-17 DIAGNOSIS — I10 Essential (primary) hypertension: Secondary | ICD-10-CM | POA: Diagnosis present

## 2017-02-17 DIAGNOSIS — Z90710 Acquired absence of both cervix and uterus: Secondary | ICD-10-CM

## 2017-02-17 DIAGNOSIS — H5122 Internuclear ophthalmoplegia, left eye: Secondary | ICD-10-CM | POA: Diagnosis not present

## 2017-02-17 DIAGNOSIS — R509 Fever, unspecified: Secondary | ICD-10-CM | POA: Diagnosis not present

## 2017-02-17 DIAGNOSIS — R42 Dizziness and giddiness: Secondary | ICD-10-CM | POA: Diagnosis not present

## 2017-02-17 DIAGNOSIS — R269 Unspecified abnormalities of gait and mobility: Secondary | ICD-10-CM

## 2017-02-17 DIAGNOSIS — D649 Anemia, unspecified: Secondary | ICD-10-CM | POA: Diagnosis not present

## 2017-02-17 DIAGNOSIS — N182 Chronic kidney disease, stage 2 (mild): Secondary | ICD-10-CM | POA: Diagnosis not present

## 2017-02-17 DIAGNOSIS — I639 Cerebral infarction, unspecified: Secondary | ICD-10-CM | POA: Diagnosis not present

## 2017-02-17 DIAGNOSIS — Z833 Family history of diabetes mellitus: Secondary | ICD-10-CM | POA: Diagnosis not present

## 2017-02-17 DIAGNOSIS — E1136 Type 2 diabetes mellitus with diabetic cataract: Secondary | ICD-10-CM | POA: Diagnosis present

## 2017-02-17 DIAGNOSIS — H8110 Benign paroxysmal vertigo, unspecified ear: Secondary | ICD-10-CM | POA: Diagnosis not present

## 2017-02-17 DIAGNOSIS — E785 Hyperlipidemia, unspecified: Secondary | ICD-10-CM | POA: Diagnosis present

## 2017-02-17 DIAGNOSIS — I129 Hypertensive chronic kidney disease with stage 1 through stage 4 chronic kidney disease, or unspecified chronic kidney disease: Secondary | ICD-10-CM | POA: Diagnosis not present

## 2017-02-17 DIAGNOSIS — Z7982 Long term (current) use of aspirin: Secondary | ICD-10-CM | POA: Diagnosis not present

## 2017-02-17 DIAGNOSIS — E1151 Type 2 diabetes mellitus with diabetic peripheral angiopathy without gangrene: Secondary | ICD-10-CM | POA: Diagnosis present

## 2017-02-17 DIAGNOSIS — H5462 Unqualified visual loss, left eye, normal vision right eye: Secondary | ICD-10-CM | POA: Diagnosis present

## 2017-02-17 DIAGNOSIS — Z794 Long term (current) use of insulin: Secondary | ICD-10-CM

## 2017-02-17 DIAGNOSIS — B0229 Other postherpetic nervous system involvement: Secondary | ICD-10-CM | POA: Diagnosis present

## 2017-02-17 DIAGNOSIS — Z888 Allergy status to other drugs, medicaments and biological substances status: Secondary | ICD-10-CM

## 2017-02-17 DIAGNOSIS — Z7902 Long term (current) use of antithrombotics/antiplatelets: Secondary | ICD-10-CM | POA: Diagnosis not present

## 2017-02-17 DIAGNOSIS — I739 Peripheral vascular disease, unspecified: Secondary | ICD-10-CM | POA: Diagnosis present

## 2017-02-17 DIAGNOSIS — E041 Nontoxic single thyroid nodule: Secondary | ICD-10-CM | POA: Diagnosis present

## 2017-02-17 DIAGNOSIS — Z87891 Personal history of nicotine dependence: Secondary | ICD-10-CM

## 2017-02-17 DIAGNOSIS — J432 Centrilobular emphysema: Secondary | ICD-10-CM | POA: Diagnosis not present

## 2017-02-17 DIAGNOSIS — E1122 Type 2 diabetes mellitus with diabetic chronic kidney disease: Secondary | ICD-10-CM | POA: Diagnosis not present

## 2017-02-17 DIAGNOSIS — H5440 Blindness, one eye, unspecified eye: Secondary | ICD-10-CM | POA: Diagnosis not present

## 2017-02-17 HISTORY — DX: Dizziness and giddiness: R42

## 2017-02-17 LAB — BASIC METABOLIC PANEL
Anion gap: 11 (ref 5–15)
BUN: 26 mg/dL — AB (ref 6–20)
CO2: 22 mmol/L (ref 22–32)
CREATININE: 1.44 mg/dL — AB (ref 0.44–1.00)
Calcium: 10.1 mg/dL (ref 8.9–10.3)
Chloride: 103 mmol/L (ref 101–111)
GFR calc Af Amer: 39 mL/min — ABNORMAL LOW (ref >60)
GFR, EST NON AFRICAN AMERICAN: 34 mL/min — AB (ref >60)
Glucose, Bld: 100 mg/dL — ABNORMAL HIGH (ref 65–99)
Potassium: 3.5 mmol/L (ref 3.5–5.1)
SODIUM: 136 mmol/L (ref 135–145)

## 2017-02-17 LAB — CBC WITH DIFFERENTIAL/PLATELET
BASOS ABS: 0 10*3/uL (ref 0.0–0.1)
Basophils Relative: 1 %
EOS ABS: 0.2 10*3/uL (ref 0.0–0.7)
EOS PCT: 3 %
HCT: 34.1 % — ABNORMAL LOW (ref 36.0–46.0)
Hemoglobin: 11.7 g/dL — ABNORMAL LOW (ref 12.0–15.0)
LYMPHS ABS: 2 10*3/uL (ref 0.7–4.0)
Lymphocytes Relative: 25 %
MCH: 30.5 pg (ref 26.0–34.0)
MCHC: 34.3 g/dL (ref 30.0–36.0)
MCV: 89 fL (ref 78.0–100.0)
Monocytes Absolute: 0.6 10*3/uL (ref 0.1–1.0)
Monocytes Relative: 7 %
Neutro Abs: 5.3 10*3/uL (ref 1.7–7.7)
Neutrophils Relative %: 64 %
PLATELETS: 222 10*3/uL (ref 150–400)
RBC: 3.83 MIL/uL — AB (ref 3.87–5.11)
RDW: 12.7 % (ref 11.5–15.5)
WBC: 8.1 10*3/uL (ref 4.0–10.5)

## 2017-02-17 LAB — URINALYSIS, ROUTINE W REFLEX MICROSCOPIC
Bilirubin Urine: NEGATIVE
GLUCOSE, UA: NEGATIVE mg/dL
Hgb urine dipstick: NEGATIVE
Ketones, ur: NEGATIVE mg/dL
Leukocytes, UA: NEGATIVE
Nitrite: NEGATIVE
PH: 6 (ref 5.0–8.0)
Protein, ur: 30 mg/dL — AB
RBC / HPF: NONE SEEN RBC/hpf (ref 0–5)
SPECIFIC GRAVITY, URINE: 1.006 (ref 1.005–1.030)

## 2017-02-17 LAB — GLUCOSE, CAPILLARY
GLUCOSE-CAPILLARY: 161 mg/dL — AB (ref 65–99)
Glucose-Capillary: 106 mg/dL — ABNORMAL HIGH (ref 65–99)

## 2017-02-17 LAB — TROPONIN I

## 2017-02-17 LAB — CBG MONITORING, ED: GLUCOSE-CAPILLARY: 92 mg/dL (ref 65–99)

## 2017-02-17 MED ORDER — INSULIN GLARGINE 100 UNIT/ML ~~LOC~~ SOLN
50.0000 [IU] | Freq: Every day | SUBCUTANEOUS | Status: DC
  Administered 2017-02-17: 50 [IU] via SUBCUTANEOUS
  Filled 2017-02-17: qty 0.5

## 2017-02-17 MED ORDER — ACETAMINOPHEN 325 MG PO TABS: 650.0000 mg | ORAL_TABLET | Freq: Four times a day (QID) | ORAL | Status: DC | PRN

## 2017-02-17 MED ORDER — CARVEDILOL 12.5 MG PO TABS
12.5000 mg | ORAL_TABLET | Freq: Two times a day (BID) | ORAL | Status: DC
  Administered 2017-02-18 – 2017-02-19 (×3): 12.5 mg via ORAL
  Filled 2017-02-17 (×4): qty 1

## 2017-02-17 MED ORDER — HALOPERIDOL LACTATE 5 MG/ML IJ SOLN
1.0000 mg | Freq: Once | INTRAMUSCULAR | Status: AC
  Administered 2017-02-18: 1 mg via INTRAVENOUS
  Filled 2017-02-17: qty 1

## 2017-02-17 MED ORDER — HYDRALAZINE HCL 20 MG/ML IJ SOLN: 5.0000 mg | Freq: Four times a day (QID) | INTRAMUSCULAR | Status: DC | PRN

## 2017-02-17 MED ORDER — ONDANSETRON HCL 4 MG PO TABS: 4.0000 mg | ORAL_TABLET | Freq: Four times a day (QID) | ORAL | Status: DC | PRN

## 2017-02-17 MED ORDER — LORAZEPAM 2 MG/ML IJ SOLN: 0.5000 mg | Freq: Once | INTRAMUSCULAR | Status: DC

## 2017-02-17 MED ORDER — SODIUM CHLORIDE 0.9 % IV SOLN
INTRAVENOUS | Status: DC
  Administered 2017-02-17: 19:00:00 via INTRAVENOUS

## 2017-02-17 MED ORDER — LORAZEPAM 2 MG/ML IJ SOLN
0.5000 mg | Freq: Once | INTRAMUSCULAR | Status: AC
  Administered 2017-02-17: 0.5 mg via INTRAVENOUS
  Filled 2017-02-17: qty 1

## 2017-02-17 MED ORDER — ONDANSETRON HCL 4 MG/2ML IJ SOLN: 4.0000 mg | Freq: Four times a day (QID) | INTRAMUSCULAR | Status: DC | PRN

## 2017-02-17 MED ORDER — ENOXAPARIN SODIUM 30 MG/0.3ML ~~LOC~~ SOLN
30.0000 mg | SUBCUTANEOUS | Status: DC
  Administered 2017-02-17 – 2017-02-18 (×2): 30 mg via SUBCUTANEOUS
  Filled 2017-02-17 (×2): qty 0.3

## 2017-02-17 MED ORDER — PANTOPRAZOLE SODIUM 40 MG PO TBEC
40.0000 mg | DELAYED_RELEASE_TABLET | Freq: Every day | ORAL | Status: DC
  Administered 2017-02-17 – 2017-02-19 (×3): 40 mg via ORAL
  Filled 2017-02-17 (×3): qty 1

## 2017-02-17 MED ORDER — ASPIRIN 81 MG PO CHEW
81.0000 mg | CHEWABLE_TABLET | Freq: Every day | ORAL | Status: DC
  Administered 2017-02-17 – 2017-02-19 (×3): 81 mg via ORAL
  Filled 2017-02-17 (×3): qty 1

## 2017-02-17 MED ORDER — CLOPIDOGREL BISULFATE 75 MG PO TABS
75.0000 mg | ORAL_TABLET | Freq: Every day | ORAL | Status: DC
  Administered 2017-02-17 – 2017-02-19 (×3): 75 mg via ORAL
  Filled 2017-02-17 (×3): qty 1

## 2017-02-17 MED ORDER — AMLODIPINE BESYLATE 10 MG PO TABS
10.0000 mg | ORAL_TABLET | Freq: Every day | ORAL | Status: DC
  Administered 2017-02-17 – 2017-02-19 (×3): 10 mg via ORAL
  Filled 2017-02-17 (×3): qty 1

## 2017-02-17 MED ORDER — MECLIZINE HCL 25 MG PO TABS
25.0000 mg | ORAL_TABLET | Freq: Once | ORAL | Status: AC
  Administered 2017-02-17: 25 mg via ORAL
  Filled 2017-02-17: qty 1

## 2017-02-17 MED ORDER — SODIUM CHLORIDE 0.9 % IV BOLUS (SEPSIS)
1000.0000 mL | Freq: Once | INTRAVENOUS | Status: AC
  Administered 2017-02-17: 1000 mL via INTRAVENOUS

## 2017-02-17 MED ORDER — LEVALBUTEROL HCL 0.63 MG/3ML IN NEBU
0.6300 mg | INHALATION_SOLUTION | Freq: Four times a day (QID) | RESPIRATORY_TRACT | Status: DC
  Filled 2017-02-17: qty 3

## 2017-02-17 MED ORDER — PRAVASTATIN SODIUM 20 MG PO TABS
10.0000 mg | ORAL_TABLET | Freq: Every day | ORAL | Status: DC
  Administered 2017-02-18: 10 mg via ORAL
  Filled 2017-02-17 (×2): qty 1

## 2017-02-17 MED ORDER — LEVALBUTEROL HCL 0.63 MG/3ML IN NEBU: 0.6300 mg | INHALATION_SOLUTION | Freq: Four times a day (QID) | RESPIRATORY_TRACT | Status: DC | PRN

## 2017-02-17 MED ORDER — INSULIN ASPART 100 UNIT/ML ~~LOC~~ SOLN
0.0000 [IU] | Freq: Three times a day (TID) | SUBCUTANEOUS | Status: DC
  Administered 2017-02-18: 2 [IU] via SUBCUTANEOUS
  Administered 2017-02-18: 7 [IU] via SUBCUTANEOUS
  Administered 2017-02-19: 2 [IU] via SUBCUTANEOUS
  Administered 2017-02-19: 5 [IU] via SUBCUTANEOUS

## 2017-02-17 MED ORDER — ACETAMINOPHEN 650 MG RE SUPP: 650.0000 mg | Freq: Four times a day (QID) | RECTAL | Status: DC | PRN

## 2017-02-17 MED ORDER — STROKE: EARLY STAGES OF RECOVERY BOOK
Freq: Once | Status: DC
  Filled 2017-02-17: qty 1

## 2017-02-17 MED ORDER — SCOPOLAMINE 1 MG/3DAYS TD PT72
1.0000 | MEDICATED_PATCH | TRANSDERMAL | Status: DC
  Administered 2017-02-17: 1.5 mg via TRANSDERMAL
  Filled 2017-02-17: qty 1

## 2017-02-17 NOTE — ED Notes (Signed)
Pt assisted with trying to walk and got very dizzy, edp at bedside, pt assisted back into bed, still cannot give urine sample, will continue to monitor

## 2017-02-17 NOTE — Progress Notes (Signed)
Pt came down for MRI brain.  Pt was given ativan while up stairs.  Pt was not following commands, moving, when asked to hold still she would reply "I know."  But as soon as she was put back in the scanner would repeatedly move her entire body, scooted down out of the head holder.  RN was called who came and gave her additional meds.  Actually made pt more restless.  Unable to complete exam at this time.

## 2017-02-17 NOTE — Progress Notes (Signed)
MRI notified order obtained for medication prior to imaging. RN will be notified technician will send for patient

## 2017-02-17 NOTE — ED Notes (Signed)
Attempted report x1. 

## 2017-02-17 NOTE — H&P (Addendum)
[facility] History and Physical  Monique Cabrera AST:[id number] DOB: 1935-04-13 DOA: 02/17/2017  Referring physician:   PCP: Hollace Kinnier, DO   Chief Complaint: Vertigo   HPI:  82 year old female with a history of diabetes, hypertension, chronic kidney disease, peripheral arterial disease presented with acute onset of dizziness, unsteady gait, left facial pain and left ear pain. Onset, last night, associated with double vision and imbalance. Patient has been   fasting for today's visit her church members . Apparently her sugars have been running high at home . She has been afebrile, denies palpitations, chest pain, shortness of breath, nausea vomiting abdominal pain. Patient denies any recent falls. No tinnitus  Workup in the ER shows, blood pressure elevated, 989-211 systolic, a creatinine that baseline. 1.5, sodium 136, potassium 3.5, white blood cell count within normal limits, hemoglobin 11.7, UA negative, CT of the head negative. MRI of the brain was attempted after being administered some Ativan which made the patient even more restless.      Review of Systems: negative for the following  Constitutional: Denies fever, chills, diaphoresis, appetite change and positive fatigue.  HEENT: Denies photophobia, eye pain, redness, hearing loss, ear pain, congestion, sore throat, rhinorrhea, sneezing, mouth sores, trouble swallowing, neck pain, neck stiffness and tinnitus.  Respiratory: Denies SOB, DOE, cough, chest tightness, and wheezing.  Cardiovascular: Denies chest pain, palpitations and leg swelling.  Gastrointestinal: Denies nausea, vomiting, abdominal pain, diarrhea, constipation, blood in stool and abdominal distention.  Genitourinary: Denies dysuria, urgency, frequency, hematuria, flank pain and difficulty urinating.  Musculoskeletal: Denies myalgias, back pain, joint swelling, arthralgias and gait problem.  Skin: Denies pallor, rash and wound.  Neurological: Positive  dizziness, denies seizures, syncope, weakness, light-headedness, numbness and headaches.  Hematological: Denies adenopathy. Easy bruising, personal or family bleeding history  Psychiatric/Behavioral: Denies suicidal ideation, mood changes, confusion, nervousness, sleep disturbance and agitation       Past Medical History:  Diagnosis Date  . Acute upper respiratory infections of unspecified site   . Anemia   . Anemia, unspecified   . Atherosclerosis of native arteries of the extremities, unspecified   . Chest pain, unspecified   . Chronic kidney disease (CKD), stage II (mild)   . Diabetes mellitus   . Diarrhea   . Disorder of bone and cartilage, unspecified   . DM (diabetes mellitus) type II controlled with renal manifestation ([place])   . Herpes zoster with other nervous system complications(053.19)   . Hypercalcemia   . Hypertension   . Hypertensive renal disease, benign   . Nonspecific reaction to tuberculin skin test without active tuberculosis(795.51)   . Nonspecific tuberculin test reaction   . Other and unspecified hyperlipidemia   . Pain in joint, lower leg   . Peripheral arterial disease ([place])   . Postherpetic neuralgia   . Proteinuria   . Type II or unspecified type diabetes mellitus with renal manifestations, not stated as uncontrolled(250.40)   . Type II or unspecified type diabetes mellitus with renal manifestations, uncontrolled(250.42)   . Unspecified disorder of kidney and ureter   . Unspecified essential hypertension      Past Surgical History:  Procedure Laterality Date  . hysterectomy    . INCISION AND DRAINAGE Left 05/27/14   sebacous cyst, ear  . removal of cyst from hand    . removal of tumor from foot    . TONSILLECTOMY        Social History:  reports that she has quit smoking. Her smoking use included Cigarettes.  She has never used smokeless tobacco. She reports that she does not drink alcohol or use drugs.    Allergies  Allergen Reactions  .  Invokana [Canagliflozin] Other (See Comments)    Vagina itching and swelling Vaginal Itching and irritation   . Jardiance [Empagliflozin] Itching and Other (See Comments)    Vaginal itching and swelling    Family History  Problem Relation Age of Onset  . Diabetes Mother   . Diabetes Father   . Diabetes Sister   . Diabetes Sister         Prior to Admission medications   Medication Sig Start Date End Date Taking? Authorizing Provider  amLODipine (NORVASC) 10 MG tablet TAKE ONE TABLET BY MOUTH DAILY FOR HIGH BLOOD PRESSURE 03/08/16  Yes Tiffany L Reed, DO  aspirin 81 MG chewable tablet Chew 81 mg by mouth daily. Reported on 05/31/2016   Yes Historical Provider, MD  B-D ULTRAFINE III SHORT PEN 31G X 8 MM MISC USE WITH LANTUS EVERY DAY FOR BLOOD SUGAR 01/20/16  Yes Tiffany L Reed, DO  carvedilol (COREG) 25 MG tablet Take one tablet by mouth twice daily with a meal 01/09/16  Yes Tiffany L Reed, DO  clopidogrel (PLAVIX) 75 MG tablet Take one tablet by mouth once daily 01/09/16  Yes Tiffany L Reed, DO  enalapril (VASOTEC) 20 MG tablet Take 40 mg by mouth daily. Prescribed by kidney specialist Dr.Powell   Yes Historical Provider, MD  glucose blood (ONETOUCH VERIO) test strip Use as instructed 11/24/15  Yes Cathey Miller, RPH-CPP  Insulin Glargine (LANTUS SOLOSTAR) 100 UNIT/ML Solostar Pen Inject 50 Units into the skin daily. 01/10/17  Yes Tivis Ringer, RPH-CPP  insulin lispro (HUMALOG KWIKPEN) 100 UNIT/ML KiwkPen 8 units breakfast, 12 units lunch & supper 01/10/17  Yes Cathey Miller, RPH-CPP  lovastatin (MEVACOR) 20 MG tablet TAKE 1 TABLET (20 MG TOTAL) BY MOUTH AT BEDTIME FOR CHOLESTEROL. 01/07/17  Yes Tiffany L Reed, DO  metFORMIN (GLUCOPHAGE) 500 MG tablet TAKE 2 TABLETS BY MOUTH TWICE A DAY WITH MEALS 06/07/16  Yes Tiffany L Reed, DO  naproxen sodium (ANAPROX) 220 MG tablet Take 220 mg by mouth 2 (two) times daily as needed (pain).   Yes Historical Provider, MD  pantoprazole (PROTONIX) 40 MG tablet  Take 1 tablet (40 mg total) by mouth daily. 06/08/16  Yes Tiffany Lynelle Doctor, DO  valsartan-hydrochlorothiazide (DIOVAN-HCT) 320-25 MG tablet Take one tablet by mouth once daily 01/09/16  Yes [name], DO     Physical Exam: Vitals:   02/17/17 1422 02/17/17 1430 02/17/17 1615 02/17/17 1740  BP:  165/68 183/68 (!) 179/65  Pulse: 62 70 (!) 59 (!) 59  Resp:  17 15   Temp:    97.9 F (36.6 C)  TempSrc:    Oral  SpO2: 98% 100% 100% 100%  Weight:      Height:          Constitutional: NAD, calm, comfortable Vitals:   02/17/17 1422 02/17/17 1430 02/17/17 1615 02/17/17 1740  BP:  165/68 183/68 (!) 179/65  Pulse: 62 70 (!) 59 (!) 59  Resp:  17 15   Temp:    97.9 F (36.6 C)  TempSrc:    Oral  SpO2: 98% 100% 100% 100%  Weight:      Height:       Eyes: PERRL, lids and conjunctivae normal ENMT: Mucous membranes are moist. Posterior pharynx clear of any exudate or lesions.Normal dentition.  Neck: normal, supple, no masses, no thyromegaly  Respiratory: clear to auscultation bilaterally, no wheezing, no crackles. Normal respiratory effort. No accessory muscle use.  Cardiovascular: Regular rate and rhythm, no murmurs / rubs / gallops. No extremity edema. 2+ pedal pulses. No carotid bruits.  Abdomen: no tenderness, no masses palpated. No hepatosplenomegaly. Bowel sounds positive.  Musculoskeletal: no clubbing / cyanosis. No joint deformity upper and lower extremities. Good ROM, no contractures. Normal muscle tone.  Skin: no rashes, lesions, ulcers. No induration NeurologicCN II-XII intact, EOMs intact though pt loses conjugate gaze with looking to the right with left eye unable to full look right, no pronator drift, grip strengths equal bilaterally; strength 5/5 in all extremities, sensation intact in all extremities; finger to nose, heel to shin, rapid alternating movements normal.  Gait testing deferred Psychiatric: Normal judgment and insight. Alert and oriented x 3. Normal mood.      Labs on Admission: I have personally reviewed following labs and imaging studies  CBC:  Recent Labs Lab 02/17/17 0915  WBC 8.1  NEUTROABS 5.3  HGB 11.7*  HCT 34.1*  MCV 89.0  PLT 665    Basic Metabolic Panel:  Recent Labs Lab 02/17/17 0915  NA 136  K 3.5  CL 103  CO2 22  GLUCOSE 100*  BUN 26*  CREATININE 1.44*  CALCIUM 10.1    GFR: Estimated Creatinine Clearance: 28.5 mL/min (by C-G formula based on SCr of 1.44 mg/dL (H)).  Liver Function Tests: No results for input(s): AST, ALT, ALKPHOS, BILITOT, PROT, ALBUMIN in the last 168 hours. No results for input(s): LIPASE, AMYLASE in the last 168 hours. No results for input(s): AMMONIA in the last 168 hours.  Coagulation Profile: No results for input(s): INR, PROTIME in the last 168 hours. No results for input(s): DDIMER in the last 72 hours.  Cardiac Enzymes: No results for input(s): CKTOTAL, CKMB, CKMBINDEX, TROPONINI in the last 168 hours.  BNP (last 3 results) No results for input(s): PROBNP in the last 8760 hours.  HbA1C: No results for input(s): HGBA1C in the last 72 hours. Lab Results  Component Value Date   HGBA1C 8.5 (H) 01/06/2017   HGBA1C 9.5 (H) 10/07/2016   HGBA1C 9.0 (H) 07/07/2016     CBG:  Recent Labs Lab 02/17/17 0838  GLUCAP 92    Lipid Profile: No results for input(s): CHOL, HDL, LDLCALC, TRIG, CHOLHDL, LDLDIRECT in the last 72 hours.  Thyroid Function Tests: No results for input(s): TSH, T4TOTAL, FREET4, T3FREE, THYROIDAB in the last 72 hours.  Anemia Panel: No results for input(s): VITAMINB12, FOLATE, FERRITIN, TIBC, IRON, RETICCTPCT in the last 72 hours.  Urine analysis:    Component Value Date/Time   COLORURINE STRAW (A) 02/17/2017 1200   APPEARANCEUR CLEAR 02/17/2017 1200   LABSPEC 1.006 02/17/2017 1200   PHURINE 6.0 02/17/2017 1200   GLUCOSEU NEGATIVE 02/17/2017 1200   HGBUR NEGATIVE 02/17/2017 1200   BILIRUBINUR NEGATIVE 02/17/2017 1200   KETONESUR NEGATIVE  02/17/2017 1200   PROTEINUR 30 (A) 02/17/2017 1200   UROBILINOGEN 0.2 07/30/2014 1945   NITRITE NEGATIVE 02/17/2017 1200   LEUKOCYTESUR NEGATIVE 02/17/2017 1200    Sepsis Labs: @LABRCNTIP (procalcitonin:4,lacticidven:4) )No results found for this or any previous visit (from the past 240 hour(s)).       Radiological Exams on Admission: Ct Head Wo Contrast  Result Date: 02/17/2017 CLINICAL DATA:  Dizziness starting last night EXAM: CT HEAD WITHOUT CONTRAST TECHNIQUE: Contiguous axial images were obtained from the base of the skull through the vertex without intravenous contrast. COMPARISON:  05/19/2010 FINDINGS: Brain: No  intracranial hemorrhage, mass effect or midline shift. No acute cortical infarction. No mass lesion is noted on this unenhanced scan. Stable mild cerebral atrophy. Again noted mild periventricular and patchy subcortical white matter decreased attenuation consistent with chronic small vessel ischemic changes. Ventricular size is stable from prior exam. Vascular: Atherosclerotic calcifications of carotid siphon. Skull: No skull fracture is noted.  No bony lesion. Sinuses/Orbits: Mild mucosal thickening with partial opacification right ethmoid air cells. No paranasal sinuses air-fluid levels. Other: None IMPRESSION: No acute intracranial abnormality. Stable atrophy and chronic white matter disease. Atherosclerotic calcifications of carotid siphon again noted. No definite acute cortical infarction. Mucosal thickening with partial opacification right ethmoid air cells. Electronically Signed   By: Lahoma Crocker M.D.   On: 02/17/2017 11:03   Ct Head Wo Contrast  Result Date: 02/17/2017 CLINICAL DATA:  Dizziness starting last night EXAM: CT HEAD WITHOUT CONTRAST TECHNIQUE: Contiguous axial images were obtained from the base of the skull through the vertex without intravenous contrast. COMPARISON:  05/19/2010 FINDINGS: Brain: No intracranial hemorrhage, mass effect or midline shift. No acute  cortical infarction. No mass lesion is noted on this unenhanced scan. Stable mild cerebral atrophy. Again noted mild periventricular and patchy subcortical white matter decreased attenuation consistent with chronic small vessel ischemic changes. Ventricular size is stable from prior exam. Vascular: Atherosclerotic calcifications of carotid siphon. Skull: No skull fracture is noted.  No bony lesion. Sinuses/Orbits: Mild mucosal thickening with partial opacification right ethmoid air cells. No paranasal sinuses air-fluid levels. Other: None IMPRESSION: No acute intracranial abnormality. Stable atrophy and chronic white matter disease. Atherosclerotic calcifications of carotid siphon again noted. No definite acute cortical infarction. Mucosal thickening with partial opacification right ethmoid air cells. Electronically Signed   By: Lahoma Crocker M.D.   On: 02/17/2017 11:03      EKG: Independently reviewed.    Assessment/Plan Principal Problem:   Acute onset of severe vertigo Given vertiginous symptoms and unsteady gait she will be admitted to telemetry Will hydrate with IV fluids and start meclizine Will need to reattempt MRI of the brain in the morning, to rule out CVA/PRESS syndrome, if one done this evening is suboptimal PTOT evaluation for vestibular rehabilitation Scopolamine patch Check orthostatics, IV fluid that the patient orthostatics are positive Patient is on aspirin and Plavix for peripheral arterial disease which will be continued  neurology consult if MRI brain is positive      Essential hypertension, benign Uncontrolled on admission, continue home medications with the exception of diuretics Continue Coreg, Norvasc, hold  Vasotec and Diovan/HCTZ    Chronic kidney disease (CKD), stage II (mild) Baseline creatinine appears to be 1.4 Will hydrate gently with IV fluids that the patient has been fasting   Peripheral arterial disease (HCC)-continue aspirin and Plavix  Diabetes  mellitus type 2 Check hemoglobin A1c, continue Lantus Hold Glucophage Start sliding-scale insulin  Dyslipidemia continue statin    DVT prophylaxis:  Lovenox     Code Status Orders Full code        consults called:None  Family Communication: Admission, patients condition and plan of care including tests being ordered have been discussed with the patient  who indicates understanding and agree with the plan and Code Status  Admission status: Observation  Disposition plan: Further plan will depend as patient's clinical course evolves and further radiologic and laboratory data become available. Likely home when stable      [name] MD [facility] Pager [phone]  If 7PM-7AM, please contact night-coverage [email or web address] Password [email or web address]  02/17/2017, 5:44 PM

## 2017-02-17 NOTE — ED Triage Notes (Signed)
Pt from home feeling dizzy since last night. Pt thinks her blood sugar is messed up.

## 2017-02-17 NOTE — ED Notes (Signed)
MRI informed RN that pt not holding still for scan. RN obtained order for more ativan and administered to pt.

## 2017-02-17 NOTE — Progress Notes (Signed)
Patient arrived to unit, report given by ED nurse to [name], RN Charge. MD paged to bedside, orders recieved. VSS, NAD noted, patient oriented to self and situation, denies pain at this time. Diet order called in by family per patient request. Denies questions/concerns at this time.

## 2017-02-17 NOTE — ED Notes (Signed)
IV Team at bedside 

## 2017-02-17 NOTE — Progress Notes (Signed)
0.5 mg Ativan IV entered for MRI. Ativan increased patient confusion prior to previous unsuccessful MRI. MD paged for alternative. RN will continue to monitor.

## 2017-02-17 NOTE — ED Provider Notes (Signed)
[facility] DEPT Provider Note   CSN: [id number] Arrival date & time: 02/17/17  0813     History   Chief Complaint Chief Complaint  Patient presents with  . Dizziness    HPI Monique Cabrera is a 82 y.o. female.  HPI   Pt with hx DM, HTN, CKD p/w dizziness and feeling off balance since last night.  States with both eyes open, she sees things spinning around her, worse with moving her head.  When she walks she is bumping into walls.  Denies any pain, recent illness, vomiting, change in bowel habits, urinary symptoms.  Has been fasting for the past two days with her church.  Thought her blood sugar might have been high because she feels similar when this is the case.  Denies any recent falls.    Past Medical History:  Diagnosis Date  . Acute upper respiratory infections of unspecified site   . Anemia   . Anemia, unspecified   . Atherosclerosis of native arteries of the extremities, unspecified   . Chest pain, unspecified   . Chronic kidney disease (CKD), stage II (mild)   . Diabetes mellitus   . Diarrhea   . Disorder of bone and cartilage, unspecified   . DM (diabetes mellitus) type II controlled with renal manifestation ([place])   . Herpes zoster with other nervous system complications(053.19)   . Hypercalcemia   . Hypertension   . Hypertensive renal disease, benign   . Nonspecific reaction to tuberculin skin test without active tuberculosis(795.51)   . Nonspecific tuberculin test reaction   . Other and unspecified hyperlipidemia   . Pain in joint, lower leg   . Peripheral arterial disease ([place])   . Postherpetic neuralgia   . Proteinuria   . Type II or unspecified type diabetes mellitus with renal manifestations, not stated as uncontrolled(250.40)   . Type II or unspecified type diabetes mellitus with renal manifestations, uncontrolled(250.42)   . Unspecified disorder of kidney and ureter   . Unspecified essential hypertension     Patient Active Problem List   Diagnosis Date Noted  . Acute onset of severe vertigo 02/17/2017  . Atherosclerosis of native arteries of the extremities with intermittent claudication 03/11/2014  . Diabetic retinopathy ([place]) 03/11/2014  . Retinal hemorrhage due to secondary diabetes ([place]) 03/11/2014  . Type II diabetes mellitus with renal manifestations, uncontrolled ([place]) 03/11/2014  . Hepatitis C 03/11/2014  . Hyperlipidemia with target LDL less than 100 03/11/2014  . Hypoglycemia 04/16/2013  . Disorder of bone and cartilage, unspecified   . Other and unspecified hyperlipidemia   . Essential hypertension, benign   . Atherosclerosis of native arteries of the extremities, unspecified   . Chronic kidney disease (CKD), stage II (mild)   . Peripheral arterial disease ([place])   . Anemia   . Postherpetic neuralgia   . Hypertension     Past Surgical History:  Procedure Laterality Date  . hysterectomy    . INCISION AND DRAINAGE Left 05/27/14   sebacous cyst, ear  . removal of cyst from hand    . removal of tumor from foot    . TONSILLECTOMY      OB History    No data available       Home Medications    Prior to Admission medications   Medication Sig Start Date End Date Taking? Authorizing Provider  amLODipine (NORVASC) 10 MG tablet TAKE ONE TABLET BY MOUTH DAILY FOR HIGH BLOOD PRESSURE 03/08/16  Yes Tiffany L Reed, DO  aspirin  81 MG chewable tablet Chew 81 mg by mouth daily. Reported on 05/31/2016   Yes Historical Provider, MD  B-D ULTRAFINE III SHORT PEN 31G X 8 MM MISC USE WITH LANTUS EVERY DAY FOR BLOOD SUGAR 01/20/16  Yes Tiffany L Reed, DO  carvedilol (COREG) 25 MG tablet Take one tablet by mouth twice daily with a meal 01/09/16  Yes Tiffany L Reed, DO  clopidogrel (PLAVIX) 75 MG tablet Take one tablet by mouth once daily 01/09/16  Yes Tiffany L Reed, DO  enalapril (VASOTEC) 20 MG tablet Take 40 mg by mouth daily. Prescribed by kidney specialist Dr.Powell   Yes Historical Provider, MD  glucose blood (ONETOUCH  VERIO) test strip Use as instructed 11/24/15  Yes Cathey Miller, RPH-CPP  Insulin Glargine (LANTUS SOLOSTAR) 100 UNIT/ML Solostar Pen Inject 50 Units into the skin daily. 01/10/17  Yes Tivis Ringer, RPH-CPP  insulin lispro (HUMALOG KWIKPEN) 100 UNIT/ML KiwkPen 8 units breakfast, 12 units lunch & supper 01/10/17  Yes Cathey Miller, RPH-CPP  lovastatin (MEVACOR) 20 MG tablet TAKE 1 TABLET (20 MG TOTAL) BY MOUTH AT BEDTIME FOR CHOLESTEROL. 01/07/17  Yes Tiffany L Reed, DO  metFORMIN (GLUCOPHAGE) 500 MG tablet TAKE 2 TABLETS BY MOUTH TWICE A DAY WITH MEALS 06/07/16  Yes Tiffany L Reed, DO  naproxen sodium (ANAPROX) 220 MG tablet Take 220 mg by mouth 2 (two) times daily as needed (pain).   Yes Historical Provider, MD  pantoprazole (PROTONIX) 40 MG tablet Take 1 tablet (40 mg total) by mouth daily. 06/08/16  Yes Tiffany Lynelle Doctor, DO  valsartan-hydrochlorothiazide (DIOVAN-HCT) 320-25 MG tablet Take one tablet by mouth once daily 01/09/16  Yes [place], DO    Family History Family History  Problem Relation Age of Onset  . Diabetes Mother   . Diabetes Father   . Diabetes Sister   . Diabetes Sister     Social History Social History  Substance Use Topics  . Smoking status: Former Smoker    Types: Cigarettes  . Smokeless tobacco: Never Used     Comment: Quit about age 39   . Alcohol use No     Allergies   Invokana [canagliflozin] and Jardiance [empagliflozin]   Review of Systems Review of Systems  All other systems reviewed and are negative.    Physical Exam Updated Vital Signs BP 183/68   Pulse (!) 59   Temp 98 F (36.7 C) (Oral)   Resp 15   Ht 5\' 5"  (1.651 m)   Wt 65.3 kg   SpO2 100%   BMI 23.96 kg/m   Physical Exam  Constitutional: She appears well-developed and well-nourished. No distress.  HENT:  Head: Normocephalic and atraumatic.  Neck: Neck supple.  Cardiovascular: Normal rate and regular rhythm.   Pulmonary/Chest: Effort normal and breath sounds normal. No  respiratory distress. She has no wheezes. She has no rales.  Abdominal: Soft. She exhibits no distension. There is no tenderness. There is no rebound and no guarding.  Neurological: She is alert.  CN II-XII intact, EOMs intact though pt loses conjugate gaze with looking to the right with left eye unable to full look right, no pronator drift, grip strengths equal bilaterally; strength 5/5 in all extremities, sensation intact in all extremities; finger to nose, heel to shin, rapid alternating movements normal.  Gait testing deferred.      Skin: She is not diaphoretic.  Nursing note and vitals reviewed.  After meclizine, pt is unsteady with gait.  She takes 1 step and starts  to fall to the side.    ED Treatments / Results  Labs (all labs ordered are listed, but only abnormal results are displayed) Labs Reviewed  BASIC METABOLIC PANEL - Abnormal; Notable for the following:       Result Value   Glucose, Bld 100 (*)    BUN 26 (*)    Creatinine, Ser 1.44 (*)    GFR calc non Af Amer 34 (*)    GFR calc Af Amer 39 (*)    All other components within normal limits  CBC WITH DIFFERENTIAL/PLATELET - Abnormal; Notable for the following:    RBC 3.83 (*)    Hemoglobin 11.7 (*)    HCT 34.1 (*)    All other components within normal limits  URINALYSIS, ROUTINE W REFLEX MICROSCOPIC - Abnormal; Notable for the following:    Color, Urine STRAW (*)    Protein, ur 30 (*)    Bacteria, UA RARE (*)    Squamous Epithelial / LPF 0-5 (*)    All other components within normal limits  TROPONIN I  TROPONIN I  CBG MONITORING, ED    EKG  EKG Interpretation None       Radiology Ct Head Wo Contrast  Result Date: 02/17/2017 CLINICAL DATA:  Dizziness starting last night EXAM: CT HEAD WITHOUT CONTRAST TECHNIQUE: Contiguous axial images were obtained from the base of the skull through the vertex without intravenous contrast. COMPARISON:  05/19/2010 FINDINGS: Brain: No intracranial hemorrhage, mass effect or  midline shift. No acute cortical infarction. No mass lesion is noted on this unenhanced scan. Stable mild cerebral atrophy. Again noted mild periventricular and patchy subcortical white matter decreased attenuation consistent with chronic small vessel ischemic changes. Ventricular size is stable from prior exam. Vascular: Atherosclerotic calcifications of carotid siphon. Skull: No skull fracture is noted.  No bony lesion. Sinuses/Orbits: Mild mucosal thickening with partial opacification right ethmoid air cells. No paranasal sinuses air-fluid levels. Other: None IMPRESSION: No acute intracranial abnormality. Stable atrophy and chronic white matter disease. Atherosclerotic calcifications of carotid siphon again noted. No definite acute cortical infarction. Mucosal thickening with partial opacification right ethmoid air cells. Electronically Signed   By: Lahoma Crocker M.D.   On: 02/17/2017 11:03    Procedures Procedures (including critical care time)  Medications Ordered in ED Medications  0.9 %  sodium chloride infusion (not administered)   stroke: mapping our early stages of recovery book (not administered)  sodium chloride 0.9 % bolus 1,000 mL (0 mLs Intravenous Stopped 02/17/17 1429)  meclizine (ANTIVERT) tablet 25 mg (25 mg Oral Given 02/17/17 0933)  LORazepam (ATIVAN) injection 0.5 mg (0.5 mg Intravenous Given 02/17/17 1203)  LORazepam (ATIVAN) injection 0.5 mg (0.5 mg Intravenous Given 02/17/17 1303)     Initial Impression / Assessment and Plan / ED Course  I have reviewed the triage vital signs and the nursing notes.  Pertinent labs & imaging results that were available during my care of the patient were reviewed by me and considered in my medical decision making (see chart for details).  Clinical Course as of Feb 17 1631  Thu Feb 17, 2017  1627 Per MRI Anderson [place]), pt was fidgeting a lot after first dose of ativan.  After second dose she became even more fidgety.  They will attempt again after  ativan is out of her system.    [EW]  0630 Pt admitted to [facility], Dr Allyson Sabal accepting.    [EW]    Clinical Course User Index [EW] Clayton Bibles, PA-C  Afebrile nontoxic patient with DM, CKD p/w vertiginous symptoms and unsteady gait .  After IVF and meclizine she remains very unsteady.  I ambulated with patient and she is unable to take 1-2 steps without lurching to the side.  MRI ordered.  Pt was given ativan for claustrophobia but per MRI she was unable to follow directions.  We then gave her a second dose of ativan but she was apparently unable to complete the MRI.  It is unclear if she was too sedated or not sedated enough - per tech she was calm but could not remember to stay still.  Pt is currently still off her baseline with the ativan and cannot walk, still is off balance and starts to fall when attempting to ambulate. MRI staff described paradoxical reaction to ativan with pt becoming more fidgety after ativan.  Pt admitted to [facility] for symptomatic vertigo despite tx with IVF, meclizine, and ativan and r/o stroke with MRI when pt is able.  Dr Allyson Sabal accepting.    Final Clinical Impressions(s) / ED Diagnoses   Final diagnoses:  Dizziness    New Prescriptions New Prescriptions   No medications on file     Clayton Bibles, PA-C 02/17/17 [place], MD 02/21/17 [phone]

## 2017-02-17 NOTE — Progress Notes (Signed)
MRI technician called for patient to go for MRI. Patient very confused, poor attention span, does not follow commands well. Patient administered ativan prior to last MRI which increased confusion. TRH paged. RN will continue to monitor patient.

## 2017-02-18 ENCOUNTER — Encounter (HOSPITAL_COMMUNITY): Payer: Self-pay | Admitting: *Deleted

## 2017-02-18 ENCOUNTER — Inpatient Hospital Stay (HOSPITAL_COMMUNITY): Payer: Medicare Other

## 2017-02-18 ENCOUNTER — Observation Stay (HOSPITAL_COMMUNITY): Payer: Medicare Other

## 2017-02-18 DIAGNOSIS — I6789 Other cerebrovascular disease: Secondary | ICD-10-CM | POA: Diagnosis not present

## 2017-02-18 DIAGNOSIS — E1151 Type 2 diabetes mellitus with diabetic peripheral angiopathy without gangrene: Secondary | ICD-10-CM | POA: Diagnosis present

## 2017-02-18 DIAGNOSIS — J432 Centrilobular emphysema: Secondary | ICD-10-CM | POA: Diagnosis present

## 2017-02-18 DIAGNOSIS — N183 Chronic kidney disease, stage 3 (moderate): Secondary | ICD-10-CM | POA: Diagnosis present

## 2017-02-18 DIAGNOSIS — D649 Anemia, unspecified: Secondary | ICD-10-CM | POA: Diagnosis present

## 2017-02-18 DIAGNOSIS — Z794 Long term (current) use of insulin: Secondary | ICD-10-CM | POA: Diagnosis not present

## 2017-02-18 DIAGNOSIS — I639 Cerebral infarction, unspecified: Secondary | ICD-10-CM

## 2017-02-18 DIAGNOSIS — E041 Nontoxic single thyroid nodule: Secondary | ICD-10-CM | POA: Diagnosis present

## 2017-02-18 DIAGNOSIS — H5122 Internuclear ophthalmoplegia, left eye: Secondary | ICD-10-CM | POA: Diagnosis present

## 2017-02-18 DIAGNOSIS — I129 Hypertensive chronic kidney disease with stage 1 through stage 4 chronic kidney disease, or unspecified chronic kidney disease: Secondary | ICD-10-CM | POA: Diagnosis present

## 2017-02-18 DIAGNOSIS — H5462 Unqualified visual loss, left eye, normal vision right eye: Secondary | ICD-10-CM | POA: Diagnosis present

## 2017-02-18 DIAGNOSIS — E1136 Type 2 diabetes mellitus with diabetic cataract: Secondary | ICD-10-CM | POA: Diagnosis present

## 2017-02-18 DIAGNOSIS — Z7902 Long term (current) use of antithrombotics/antiplatelets: Secondary | ICD-10-CM | POA: Diagnosis not present

## 2017-02-18 DIAGNOSIS — E785 Hyperlipidemia, unspecified: Secondary | ICD-10-CM | POA: Diagnosis present

## 2017-02-18 DIAGNOSIS — Z7982 Long term (current) use of aspirin: Secondary | ICD-10-CM | POA: Diagnosis not present

## 2017-02-18 DIAGNOSIS — Z888 Allergy status to other drugs, medicaments and biological substances status: Secondary | ICD-10-CM | POA: Diagnosis not present

## 2017-02-18 DIAGNOSIS — H5440 Blindness, one eye, unspecified eye: Secondary | ICD-10-CM | POA: Diagnosis present

## 2017-02-18 DIAGNOSIS — B0229 Other postherpetic nervous system involvement: Secondary | ICD-10-CM | POA: Diagnosis present

## 2017-02-18 DIAGNOSIS — Z90710 Acquired absence of both cervix and uterus: Secondary | ICD-10-CM | POA: Diagnosis not present

## 2017-02-18 DIAGNOSIS — Z833 Family history of diabetes mellitus: Secondary | ICD-10-CM | POA: Diagnosis not present

## 2017-02-18 DIAGNOSIS — Z87891 Personal history of nicotine dependence: Secondary | ICD-10-CM | POA: Diagnosis not present

## 2017-02-18 DIAGNOSIS — E1122 Type 2 diabetes mellitus with diabetic chronic kidney disease: Secondary | ICD-10-CM | POA: Diagnosis present

## 2017-02-18 DIAGNOSIS — H8110 Benign paroxysmal vertigo, unspecified ear: Secondary | ICD-10-CM

## 2017-02-18 DIAGNOSIS — I63533 Cerebral infarction due to unspecified occlusion or stenosis of bilateral posterior cerebral arteries: Secondary | ICD-10-CM | POA: Diagnosis not present

## 2017-02-18 DIAGNOSIS — R42 Dizziness and giddiness: Secondary | ICD-10-CM | POA: Diagnosis not present

## 2017-02-18 DIAGNOSIS — I10 Essential (primary) hypertension: Secondary | ICD-10-CM | POA: Diagnosis not present

## 2017-02-18 DIAGNOSIS — R269 Unspecified abnormalities of gait and mobility: Secondary | ICD-10-CM

## 2017-02-18 DIAGNOSIS — I63513 Cerebral infarction due to unspecified occlusion or stenosis of bilateral middle cerebral arteries: Secondary | ICD-10-CM | POA: Diagnosis not present

## 2017-02-18 HISTORY — DX: Cerebral infarction, unspecified: I63.9

## 2017-02-18 LAB — LIPID PANEL
Cholesterol: 187 mg/dL (ref 0–200)
HDL: 61 mg/dL (ref >40)
LDL CALC: 102 mg/dL — AB (ref 0–99)
Total CHOL/HDL Ratio: 3.1 RATIO
Triglycerides: 121 mg/dL (ref <150)
VLDL: 24 mg/dL (ref 0–40)

## 2017-02-18 LAB — GLUCOSE, CAPILLARY
GLUCOSE-CAPILLARY: 77 mg/dL (ref 65–99)
GLUCOSE-CAPILLARY: 321 mg/dL — AB (ref 65–99)
Glucose-Capillary: 179 mg/dL — ABNORMAL HIGH (ref 65–99)
Glucose-Capillary: 102 mg/dL — ABNORMAL HIGH (ref 65–99)

## 2017-02-18 LAB — TROPONIN I: Troponin I: <0.03 ng/mL (ref <0.03)

## 2017-02-18 LAB — HEMOGLOBIN A1C
HEMOGLOBIN A1C: 8.3 % — AB (ref 4.8–5.6)
MEAN PLASMA GLUCOSE: 192

## 2017-02-18 LAB — VITAMIN B12: VITAMIN B 12: 274 pg/mL (ref 180–914)

## 2017-02-18 LAB — ECHOCARDIOGRAM COMPLETE
Height: 65 in
Weight: 2304 oz

## 2017-02-18 MED ORDER — INSULIN GLARGINE 100 UNIT/ML ~~LOC~~ SOLN
40.0000 [IU] | Freq: Every day | SUBCUTANEOUS | Status: DC
  Filled 2017-02-18 (×2): qty 0.4

## 2017-02-18 NOTE — Progress Notes (Signed)
Patient transported for MRI. CCMD notified.

## 2017-02-18 NOTE — Progress Notes (Signed)
Inpatient Diabetes Program Recommendations  AACE/ADA: New Consensus Statement on Inpatient Glycemic Control (2015)  Target Ranges:  Prepandial:   less than 140 mg/dL      Peak postprandial:   less than 180 mg/dL (1-2 hours)      Critically ill patients:  140 - 180 mg/dL   Lab Results  Component Value Date   GLUCAP 77 02/18/2017   HGBA1C 8.3 (H) 02/17/2017    Review of Glycemic Control  Diabetes history: dm2, elderly, GFR=39  Inpatient Diabetes Program Recommendations:   Insulin - Basal: Please consider decreasing Lantus to 42 units qhs (home dose is 50 units qhs) as low FBG's noted.  Correction (SSI): Agree with Novolog 0-9 units tidac  Insulin - Meal Coverage: Agree with holding home regimen of Novolog 8 units w/ breakfast and 12 units with lunch and dinner.   Oral Agents: Agree with holding Metformin 1000 mg bid  Thank you,  Windy Carina, RN, MSN Diabetes Coordinator Inpatient Diabetes Program [phone] (Team Pager)

## 2017-02-18 NOTE — Consult Note (Signed)
NEURO HOSPITALIST CONSULT NOTE   Requesting physician: Dr. Allyson Sabal   Reason for Consult: New stroke   History obtained from:  Patient and Chart     HPI:                                                                                                                                          Monique Cabrera is an 82 y.o. woman with PMHx of HTN, type 2 DM, hyperlipidemia, and peripheral vascular disease who was admitted on 02/17/17 for vision changes, dizziness, and feeling off balance. She reports her symptoms initially started Wed (2/21) night around 6PM. She describes feeling cross-eyed and that her vision is blurry. She denies double vision. She states when she closes her eyes she feels a spinning sensation but this will improve when she opens her eyes. She noticed she was bumping into things when she was walking and felt she was drifting to the right side. She notes when she covers her left eye she can see normally. She has prior hx of "retinal leakage" and cataract extraction in the left eye and has been told she is legally blind in that eye. She sometimes gets similar symptoms of vision changes and feeling dizzy when her blood sugar is low so she checked her BG and states it was normal. She also checked her BP at the time and this was normal too. She cannot remember the exact values. She describes getting frequent hypoglycemia, several times a week. She reports her DM was doing well until about 1.5 years ago she started to get hypoglycemia and then subsequent hyperglycemia. She denies any associated weakness, changes in speech, or difficulty swallowing. She denies tingling/numbness recently, but states she does get this occasionally in her toes and fingers. She takes aspirin 81 mg daily and plavix 75 mg daily for her peripheral vascular disease.   Past Medical History:  Diagnosis Date  . Acute upper respiratory infections of unspecified site   . Anemia   . Anemia, unspecified    . Atherosclerosis of native arteries of the extremities, unspecified   . Chest pain, unspecified   . Chronic kidney disease (CKD), stage II (mild)   . Diabetes mellitus   . Diarrhea   . Disorder of bone and cartilage, unspecified   . DM (diabetes mellitus) type II controlled with renal manifestation ([place])   . Herpes zoster with other nervous system complications(053.19)   . Hypercalcemia   . Hypertension   . Hypertensive renal disease, benign   . Nonspecific reaction to tuberculin skin test without active tuberculosis(795.51)   . Nonspecific tuberculin test reaction   . Other and unspecified hyperlipidemia   . Pain in joint, lower leg   . Peripheral arterial disease ([place])   . Postherpetic neuralgia   .  Proteinuria   . Type II or unspecified type diabetes mellitus with renal manifestations, not stated as uncontrolled(250.40)   . Type II or unspecified type diabetes mellitus with renal manifestations, uncontrolled(250.42)   . Unspecified disorder of kidney and ureter   . Unspecified essential hypertension     Past Surgical History:  Procedure Laterality Date  . hysterectomy    . INCISION AND DRAINAGE Left 05/27/14   sebacous cyst, ear  . removal of cyst from hand    . removal of tumor from foot    . TONSILLECTOMY      Family History  Problem Relation Age of Onset  . Diabetes Mother   . Diabetes Father   . Diabetes Sister   . Diabetes Sister     Family History: Mother DM Father CVA, DM  Social History:  reports that she has quit smoking. Her smoking use included Cigarettes. She has never used smokeless tobacco. She reports that she does not drink alcohol or use drugs.  Allergies  Allergen Reactions  . Invokana [Canagliflozin] Other (See Comments)    Vagina itching and swelling Vaginal Itching and irritation   . Jardiance [Empagliflozin] Itching and Other (See Comments)    Vaginal itching and swelling    MEDICATIONS:                                                                                                                      I have reviewed the patient's current medications.   ROS:                                                                                                                                       History obtained from the patient  General ROS: negative for - chills, fatigue, fever, night sweats, weight gain or weight loss Psychological ROS: negative for - behavioral disorder, hallucinations, memory difficulties, mood swings or suicidal ideation Ophthalmic ROS: negative for - eye pain or loss of vision ENT ROS: negative for - epistaxis, nasal discharge, oral lesions, sore throat, tinnitus Allergy and Immunology ROS: negative for - hives or itchy/watery eyes Hematological and Lymphatic ROS: negative for - bleeding problems, bruising or swollen lymph nodes Endocrine ROS: negative for - galactorrhea, hair pattern changes, polydipsia/polyuria or temperature intolerance Respiratory ROS: negative for - cough, hemoptysis, shortness of breath or wheezing Cardiovascular ROS: negative for - chest pain, dyspnea on exertion, edema or irregular heartbeat  Gastrointestinal ROS: negative for - abdominal pain, diarrhea, hematemesis, nausea/vomiting or stool incontinence Genito-Urinary ROS: negative for - dysuria, hematuria, incontinence or urinary frequency/urgency Musculoskeletal ROS: negative for - joint swelling or muscular weakness Neurological ROS: as noted in HPI Dermatological ROS: negative for rash and skin lesion changes   Blood pressure (!) 142/81, pulse 74, temperature 98.6 F (37 C), temperature source Oral, resp. rate 18, height 5\' 5"  (1.651 m), weight 144 lb (65.3 kg), SpO2 100 %.   Neurologic Examination:                                                                                                      HEENT-  [place]/AT, sclera anicteric, mucus membranes moist Cardiovascular- RRR, no m/g/r Lungs- CTA bilaterally, breaths  non-labored Abdomen- BS+, soft, non-tender  Extremities- no peripheral edema, moves all extremities  Lymph-no adenopathy palpable Musculoskeletal-no joint tenderness, deformity or swelling, no muscular tenderness noted, full range of motion without pain Skin-warm and dry, no hyperpigmentation, vitiligo, or suspicious lesions  Neurological Examination Mental Status: Alert and oriented to person, place, time, and situation. Thought content appropriate.  Speech fluent without evidence of aphasia.  Able to follow 3 step commands without difficulty. Cranial Nerves: II: Visual fields grossly normal, pupils equal, round, reactive to light and accommodation III,IV, VI: ptosis not present, EOMI but left eye unable to go medially from midline when looking to right.  V,VII: smile symmetric, facial light touch sensation normal bilaterally VIII: hearing normal bilaterally IX,X: uvula rises symmetrically XI: bilateral shoulder shrug XII: midline tongue extension Motor: Right : Upper extremity   5/5    Left:     Upper extremity   5/5  Lower extremity   5/5     Lower extremity   5/5 Tone and bulk:normal tone throughout; no atrophy noted Sensory: Light touch intact symmetrically and bilaterally. Reduced pinprick in lower extremities from toes to ankles bilaterally.  Deep Tendon Reflexes: 2+ in upper extremities. Diminished in lower extremities. Plantars: Right: downgoing   Left: downgoing Cerebellar: normal finger-to-nose, normal rapid alternating movements and normal heel-to-shin test Gait: Drifts towards right side with ambulation       Lab Results: Basic Metabolic Panel:  Recent Labs Lab 02/17/17 0915  NA 136  K 3.5  CL 103  CO2 22  GLUCOSE 100*  BUN 26*  CREATININE 1.44*  CALCIUM 10.1    CBC:  Recent Labs Lab 02/17/17 0915  WBC 8.1  NEUTROABS 5.3  HGB 11.7*  HCT 34.1*  MCV 89.0  PLT 222    Cardiac Enzymes:  Recent Labs Lab 02/17/17 1842 02/17/17 2232  TROPONINI  <0.03 <0.03    Lipid Panel:  Recent Labs Lab 02/17/17 2355  CHOL 187  TRIG 121  HDL 61  CHOLHDL 3.1  VLDL 24  LDLCALC 102*    CBG:  Recent Labs Lab 02/17/17 0838 02/17/17 1808 02/17/17 2124 02/18/17 0635  GLUCAP 92 106* 161* 25    Microbiology: Results for orders placed or performed during the hospital encounter of 05/27/14  Culture, routine-abscess     Status: None  Collection Time: 05/27/14  2:24 PM  Result Value Ref Range Status   Specimen Description ABSCESS EAR LEFT  Final   Special Requests NONE  Final   Gram Stain   Final    NO WBC SEEN NO SQUAMOUS EPITHELIAL CELLS SEEN NO ORGANISMS SEEN Performed at [facility]   Culture   Final    FEW STAPHYLOCOCCUS SPECIES (COAGULASE NEGATIVE) Performed at [facility]   Report Status 05/30/2014 FINAL  Final    Imaging: Ct Head Wo Contrast  Result Date: 02/17/2017 CLINICAL DATA:  Dizziness starting last night EXAM: CT HEAD WITHOUT CONTRAST TECHNIQUE: Contiguous axial images were obtained from the base of the skull through the vertex without intravenous contrast. COMPARISON:  05/19/2010 FINDINGS: Brain: No intracranial hemorrhage, mass effect or midline shift. No acute cortical infarction. No mass lesion is noted on this unenhanced scan. Stable mild cerebral atrophy. Again noted mild periventricular and patchy subcortical white matter decreased attenuation consistent with chronic small vessel ischemic changes. Ventricular size is stable from prior exam. Vascular: Atherosclerotic calcifications of carotid siphon. Skull: No skull fracture is noted.  No bony lesion. Sinuses/Orbits: Mild mucosal thickening with partial opacification right ethmoid air cells. No paranasal sinuses air-fluid levels. Other: None IMPRESSION: No acute intracranial abnormality. Stable atrophy and chronic white matter disease. Atherosclerotic calcifications of carotid siphon again noted. No definite acute cortical infarction. Mucosal  thickening with partial opacification right ethmoid air cells. Electronically Signed   By: Lahoma Crocker M.D.   On: 02/17/2017 11:03   Mr Brain Wo Contrast  Result Date: 02/18/2017 CLINICAL DATA:  Dizziness.  Off balance. EXAM: MRI HEAD WITHOUT CONTRAST TECHNIQUE: Multiplanar, multiecho pulse sequences of the brain and surrounding structures were obtained without intravenous contrast. COMPARISON:  Head CT from yesterday FINDINGS: Brain: Punctate acute infarct left para median along the upper floor of the fourth ventricle. Mild microvascular ischemic change for age in the cerebral white matter and pons. Remote lacunar infarct in the left periatrial white matter. Mild for age generalized volume loss. No acute or chronic blood products, hydrocephalus, or mass. Vascular: Major flow voids are preserved Skull and upper cervical spine: Negative Sinuses/Orbits: Bilateral cataract resection IMPRESSION: 1. Punctate acute infarct along the floor of the upper fourth ventricle. 2. Mild for age chronic microvascular ischemia and atrophy. Electronically Signed   By: Monte Fantasia M.D.   On: 02/18/2017 07:47   Dg Chest Port 1 View  Result Date: 02/17/2017 CLINICAL DATA:  fever EXAM: PORTABLE CHEST - 1 VIEW COMPARISON:  07/30/2014 FINDINGS: Lungs are clear. Heart size and mediastinal contours are within normal limits. Atheromatous aorta. No effusion. Visualized bones unremarkable. IMPRESSION: No acute cardiopulmonary disease. Aortic Atherosclerosis (ICD10-170.0) Electronically Signed   By: Lucrezia Europe M.D.   On: 02/17/2017 20:13     Assessment/Plan:  Acute Punctate Infarct in Fourth Ventricle: Very small infarct, which could explain her visual, vertigo, and gait disturbances. Echo completed and shows normal systolic function, grade 1 diastolic dysfunction, no clot. She is already on aspirin and Plavix at home for her peripheral vascular disease. She does not note any issues with bleeding or bruising. Would continue this  regimen now and modify her other risk factors (HTN, DM, hyperlipidemia) as these could be improved.  - f/u Carotid dopplers - PT/OT/SLP  Peripheral Neuropathy: She has some evidence of peripheral neuropathy on exam with decreased sensation to pinprick in a stocking-like distribution. This is most likely from her long standing diabetes and likely contributing to her gait disturbance.  Will check vitamin B12 and folate levels to be complete. Hopefully gait will improve with PT.   HTN: BP 142/81. We are close to 48 hours since her neuro symptoms started. Continue Amlodipine 10 mg daily and Coreg 12.5 mg BID. Would not be aggressive with BP control at this time. Can be further controlled as an outpatient.   Type 2 DM: HbA1c 8.5 in Jan this year. HbA1c here is 8.3. She reports frequent hypoglycemia every week. She is likely overcorrecting her hypoglycemia which is leading hyperglycemia. Recommend to decrease her Lantus to 40 units QHS. Her BG dropped from 161 last night to 77 this morning. She may need titration of her mealtime insulin but this can be done as an outpatient.   Hyperlipidemia: LDL 102. Would like for LDL to be less than 70. Will switch her from Pravastatin 10 mg daily to Atorvastatin 40 mg daily.    Attending note to follow  Albin Felling, MD, MPH Internal Medicine Resident, PGY-III Pager: [phone]

## 2017-02-18 NOTE — Evaluation (Signed)
Occupational Therapy Evaluation Patient Details Name: Monique Cabrera MRN: [id number] DOB: 1935-07-10 Today's Date: 02/18/2017    History of Present Illness Monique Cabrera is an 82 y.o. woman with PMHx of HTN, type 2 DM, hyperlipidemia, and peripheral vascular disease who was admitted on 02/17/17 for vision changes, dizziness, and feeling off balance. MRI: Punctate acute infarct left para median along the floor of the upper fourth ventricle and a remote infarct in left periatrial white matter   Clinical Impression   This 82 yo female admitted with above presents to acute OT with deficits below (see OT problem list) thus affecting her PLOF of being totally independent with basic ADLs and IADLs. She will benefit from acute OT with follow up [place].    Follow Up Recommendations  Home health OT;Supervision/Assistance - 24 hour    Equipment Recommendations  Other (comment) (pt states she will get a tub seat on her own)       Precautions / Restrictions Precautions Precautions: Fall      Mobility Bed Mobility Overal bed mobility: Modified Independent                Transfers Overall transfer level: Needs assistance Equipment used: None Transfers: Sit to/from Stand Sit to Stand: Min assist         General transfer comment: When first up in room I did not see any issues from a visual standpoint; however once we walked out in the hallway pt with tendency to go to the right and when entering room she was close to door frame, set off hand gel pump since she was so close to it on right and ran into the foot of bed on her right    Balance Overall balance assessment: Needs assistance Sitting-balance support: No upper extremity supported;Feet supported Sitting balance-Leahy Scale: Good     Standing balance support: No upper extremity supported   Standing balance comment: good statically, fair with dynamics                            ADL Overall ADL's : Needs  assistance/impaired Eating/Feeding: Independent;Sitting   Grooming: Supervision/safety;Wash/dry hands;Standing   Upper Body Bathing: Supervision/ safety;Sitting   Lower Body Bathing: Supervison/ safety;Sit to/from stand   Upper Body Dressing : Supervision/safety;Sitting   Lower Body Dressing: Supervision/safety;Sit to/from stand   Toilet Transfer: Min guard;Ambulation;Regular Toilet;Grab bars (no AD)   Toileting- Clothing Manipulation and Hygiene: Supervision/safety;Sit to/from stand         General ADL Comments: We did discuss safety in the kitchen as far as vision is concerned     Vision Baseline Vision/History: Wears glasses Wears Glasses: At all times Patient Visual Report: Blurring of vision (when she looks right) Vision Assessment?: Yes Eye Alignment: Within Functional Limits Ocular Range of Motion:  (left eye restricted to right) Alignment/Gaze Preference: Within Defined Limits Tracking/Visual Pursuits: Left eye does not track medially Saccades: Within functional limits Convergence: Impaired (comment) (cannot adduct left eye) Visual Fields: No apparent deficits Diplopia Assessment:  (blurriness diappears with one eye closed) Additional Comments: describes vision as blurry/fuzzy when looking to right ( as if seeing double but pretty much totally overlapping)            Pertinent Vitals/Pain Pain Assessment: No/denies pain     Hand Dominance Right   Extremity/Trunk Assessment Upper Extremity Assessment Upper Extremity Assessment: Overall WFL for tasks assessed           Communication  Communication Communication: No difficulties   Cognition Arousal/Alertness: Awake/alert Behavior During Therapy: WFL for tasks assessed/performed Overall Cognitive Status: Within Functional Limits for tasks assessed                                Home Living Family/patient expects to be discharged to:: Private residence Living Arrangements:  Children Available Help at Discharge: Family;Available 24 hours/day Type of Home: Apartment Home Access: Level entry     Home Layout: One level     Bathroom Shower/Tub: Tub/shower unit;Curtain Shower/tub characteristics: Architectural technologist: Standard     Home Equipment: None          Prior Functioning/Environment Level of Independence: Independent        Comments: pt DOES NOT drive        OT Problem List: Impaired balance (sitting and/or standing);Impaired vision/perception      OT Treatment/Interventions: Visual/perceptual remediation/compensation;Patient/family education;Balance training    OT Goals(Current goals can be found in the care plan section) Acute Rehab OT Goals Patient Stated Goal: to go home OT Goal Formulation: With patient Time For Goal Achievement: 02/25/17 Potential to Achieve Goals: Good  OT Frequency: Min 2X/week              End of Session Equipment Utilized During Treatment: Gait belt  Activity Tolerance: Patient tolerated treatment well Patient left: in chair;with call bell/phone within reach;with chair alarm set  OT Visit Diagnosis: Unsteadiness on feet (R26.81);Other (comment) (vision deficits left eye)                ADL either performed or assessed with clinical judgement  Time: [phone] OT Time Calculation (min): 29 min Charges:  OT General Charges $OT Visit: 1 Procedure OT Evaluation $OT Eval Moderate Complexity: 1 Procedure OT Treatments $Self Care/Home Management : 8-22 mins [place], OTR/L [phone] 02/18/2017

## 2017-02-18 NOTE — Evaluation (Addendum)
Physical Therapy Evaluation Patient Details Name: Monique Cabrera MRN: [id number] DOB: [birth year] Today's Date: 02/18/2017   History of Present Illness  Monique Cabrera is an 82 y.o. woman with PMHx of HTN, type 2 DM, hyperlipidemia, and peripheral vascular disease who was admitted on 02/17/17 for vision changes, dizziness, and feeling off balance. MRI: Punctate acute infarct left para median along the floor of the upper fourth ventricle and a remote infarct in left periatrial Arvle Grabe matter  Clinical Impression  Pt admitted with above diagnosis. Pt currently with functional limitations due to the deficits listed below (see PT Problem List). Pt was positive for right BPPV and treated pt x 2 to try and ensure that crystals were moved.  Pt reports dizziness better but still with slight unsteadiness therefore tried pt with RW and pt able to ambulate with min guard assist with RW.  Son to be present inittially at all times per pt. Will follow acutely.  Pt will benefit from skilled PT to increase their independence and safety with mobility to allow discharge to the venue listed below.      Follow Up Recommendations Home health PT;Supervision/Assistance - 24 hour    Equipment Recommendations  Rolling walker with 5" wheels    Recommendations for Other Services       Precautions / Restrictions Precautions Precautions: Fall Restrictions Weight Bearing Restrictions: No      Mobility  Bed Mobility Overal bed mobility: Modified Independent                Transfers Overall transfer level: Needs assistance Equipment used: Rolling walker (2 wheeled);None Transfers: Sit to/from Stand Sit to Stand: Min guard         General transfer comment: Steadying assist needed without device but steady to standing with device.   Ambulation/Gait Ambulation/Gait assistance: Min guard;Min assist Ambulation Distance (Feet): 120 Feet Assistive device: Rolling walker (2 wheeled);None Gait  Pattern/deviations: Step-through pattern;Decreased stride length;Staggering right;Drifts right/left   Gait velocity interpretation: Below normal speed for age/gender General Gait Details: Initially did not walk with device with pt able to ambulate with min assist at times as she would stagger right occasionally although would self correct most of time.  Tried RW at end of session and pt did much better with this. Still needed cues for focusing on object when she gets unsteady and pt has some difficulty following commands at times as she doesnt' seem to process what you are saying.    Stairs            Wheelchair Mobility    Modified Rankin (Stroke Patients Only)       Balance Overall balance assessment: Needs assistance Sitting-balance support: No upper extremity supported;Feet supported Sitting balance-Leahy Scale: Good     Standing balance support: Bilateral upper extremity supported;During functional activity Standing balance-Leahy Scale: Poor Standing balance comment: good statically with fair balance dynamically needing min assist to steady.             High level balance activites: Direction changes;Turns;Sudden stops High Level Balance Comments: min guard assist with RW with above.  did much better with challenges with RW.              Pertinent Vitals/Pain Pain Assessment: No/denies pain   VSS Home Living Family/patient expects to be discharged to:: Private residence Living Arrangements: Children Available Help at Discharge: Family;Available 24 hours/day Type of Home: Apartment Home Access: Level entry     Home Layout: One level Home Equipment: None  Prior Function Level of Independence: Independent         Comments: pt DOES NOT drive     Hand Dominance   Dominant Hand: Right    Extremity/Trunk Assessment   Upper Extremity Assessment Upper Extremity Assessment: Defer to OT evaluation    Lower Extremity Assessment Lower Extremity  Assessment: Generalized weakness    Cervical / Trunk Assessment Cervical / Trunk Assessment: Normal  Communication   Communication: No difficulties  Cognition Arousal/Alertness: Awake/alert Behavior During Therapy: WFL for tasks assessed/performed Overall Cognitive Status: Impaired/Different from baseline Area of Impairment: Attention;Following commands;Safety/judgement;Awareness;Problem solving   Current Attention Level: Focused   Following Commands: Follows one step commands inconsistently;Follows one step commands with increased time Safety/Judgement: Decreased awareness of safety Awareness: Intellectual Problem Solving: Difficulty sequencing;Requires verbal cues;Slow processing General Comments: Pt had a little trouble following directions needing repetition.  Unsure if she will be able to follow all vertigo precautions without f/u.  Recommend HHPT and HHOT.    General Comments General comments (skin integrity, edema, etc.): Pt positive for right BPPV.  treated for right BPPV once and then walked.  Pt felt better but still a little dizzy therefore performed treatment again for right BPPV.  After second treatment, pt reports dizziness even better. Tried to teach pt some compensation techniques however pt again due to slow processing, had trouble remembering unless cued.      Exercises     Assessment/Plan    PT Assessment Patient needs continued PT services  PT Problem List Decreased strength;Decreased activity tolerance;Decreased balance;Decreased mobility;Decreased safety awareness;Decreased knowledge of precautions;Decreased knowledge of use of DME       PT Treatment Interventions DME instruction;Gait training;Functional mobility training;Therapeutic exercise;Therapeutic activities;Balance training;Patient/family education (canalith repositioning manuever)    PT Goals (Current goals can be found in the Care Plan section)  Acute Rehab PT Goals Patient Stated Goal: to go  home PT Goal Formulation: With patient Time For Goal Achievement: 03/04/17 Potential to Achieve Goals: Good    Frequency Min 3X/week   Barriers to discharge        Co-evaluation               End of Session Equipment Utilized During Treatment: Gait belt Activity Tolerance: Patient limited by fatigue Patient left: in chair;with call bell/phone within reach;with chair alarm set Nurse Communication: Mobility status PT Visit Diagnosis: Unsteadiness on feet (R26.81);Other abnormalities of gait and mobility (R26.89);Dizziness and giddiness (R42);BPPV BPPV - Right/Left : Right         Time: 3244-0102 PT Time Calculation (min) (ACUTE ONLY): 20 min   Charges:   PT Evaluation $PT Eval Moderate Complexity: 1 Procedure     PT G Codes:         Monique Cabrera February 27, 2017, 2:55 PM [facility] Acute Rehabilitation [phone] (pager)

## 2017-02-18 NOTE — Progress Notes (Addendum)
[facility] PROGRESS NOTE  Monique Cabrera IRS:[id number] DOB: 12/31/1934 DOA: 02/17/2017   PCP: Hollace Kinnier, DO     Assessment/Plan: Principal Problem:   Acute onset of severe vertigo Active Problems:   Essential hypertension, benign   Chronic kidney disease (CKD), stage II (mild)   Peripheral arterial disease (HCC)   Vertigo   82 year old female with a history of diabetes, hypertension, chronic kidney disease, peripheral arterial disease presented with acute onset of dizziness, unsteady gait, left facial pain and left ear pain. Onset, last night, associated with double vision and imbalance. Patient has been   fasting for today's visit her church members . Apparently her sugars have been running high at home. Admitted for acute onset of vertigo/probable CVA   Assessment and plan  Acute onset of severe vertigo Given vertiginous symptoms and unsteady gait she will be admitted to telemetry Will hydrate with IV fluids and start meclizine Repeat MRI positive Punctate acute infarct along the floor of the upper fourth ventricle. Neurology consulted PTOT evaluation , Patient is on aspirin and Plavix for peripheral arterial disease which will be continued       Essential hypertension, benign Uncontrolled on admission, continue home medications with the exception of diuretics Continue Coreg, Norvasc, hold  Vasotec and Diovan/HCTZ    Chronic kidney disease (CKD), stage II (mild) Baseline creatinine appears to be 1.4, repeat BMP     Peripheral arterial disease (HCC)-continue aspirin and Plavix  Diabetes mellitus type 2 Check hemoglobin A1c, continue Lantus, reduce dose due to borderline low CBG today Hold Glucophage Start sliding-scale insulin  Dyslipidemia continue statin LDL 102   DVT prophylaxsis Lovenox  Code Status:  Full code      Family Communication: Discussed in detail with the patient, all imaging results, lab results explained to the  patient   Disposition Plan:  Neurology consult      Consultants:  Neurology  Procedures:  None  Antibiotics: Anti-infectives    None         HPI/Subjective: Still has vertigo and diplopia  Objective: Vitals:   02/17/17 2119 02/17/17 2254 02/18/17 0046 02/18/17 0517  BP: 113/80 (!) 145/75 (!) 164/62 (!) 151/62  Pulse: 66  67 69  Resp: 18  18 16   Temp: 98.8 F (37.1 C)     TempSrc: Oral     SpO2: 100%  99% 99%  Weight:      Height:        Intake/Output Summary (Last 24 hours) at 02/18/17 0804 Last data filed at 02/18/17 0300  Gross per 24 hour  Intake            98.33 ml  Output                0 ml  Net            98.33 ml    Exam:  Examination:  General exam: Appears calm and comfortable  Respiratory system: Clear to auscultation. Respiratory effort normal. Cardiovascular system: S1 & S2 heard, RRR. No JVD, murmurs, rubs, gallops or clicks. No pedal edema. Gastrointestinal system: Abdomen is nondistended, soft and nontender. No organomegaly or masses felt. Normal bowel sounds heard. Central nervous system: Alert and oriented. No focal neurological deficits. Extremities: Symmetric 5 x 5 power. Skin: No rashes, lesions or ulcers Psychiatry: Judgement and insight appear normal. Mood & affect appropriate.     Data Reviewed: I have personally reviewed following labs and imaging studies  Micro Results  No results found for this or any previous visit (from the past 240 hour(s)).  Radiology Reports Ct Head Wo Contrast  Result Date: 02/17/2017 CLINICAL DATA:  Dizziness starting last night EXAM: CT HEAD WITHOUT CONTRAST TECHNIQUE: Contiguous axial images were obtained from the base of the skull through the vertex without intravenous contrast. COMPARISON:  05/19/2010 FINDINGS: Brain: No intracranial hemorrhage, mass effect or midline shift. No acute cortical infarction. No mass lesion is noted on this unenhanced scan. Stable mild cerebral atrophy. Again noted  mild periventricular and patchy subcortical white matter decreased attenuation consistent with chronic small vessel ischemic changes. Ventricular size is stable from prior exam. Vascular: Atherosclerotic calcifications of carotid siphon. Skull: No skull fracture is noted.  No bony lesion. Sinuses/Orbits: Mild mucosal thickening with partial opacification right ethmoid air cells. No paranasal sinuses air-fluid levels. Other: None IMPRESSION: No acute intracranial abnormality. Stable atrophy and chronic white matter disease. Atherosclerotic calcifications of carotid siphon again noted. No definite acute cortical infarction. Mucosal thickening with partial opacification right ethmoid air cells. Electronically Signed   By: Lahoma Crocker M.D.   On: 02/17/2017 11:03   Mr Brain Wo Contrast  Result Date: 02/18/2017 CLINICAL DATA:  Dizziness.  Off balance. EXAM: MRI HEAD WITHOUT CONTRAST TECHNIQUE: Multiplanar, multiecho pulse sequences of the brain and surrounding structures were obtained without intravenous contrast. COMPARISON:  Head CT from yesterday FINDINGS: Brain: Punctate acute infarct left para median along the upper floor of the fourth ventricle. Mild microvascular ischemic change for age in the cerebral white matter and pons. Remote lacunar infarct in the left periatrial white matter. Mild for age generalized volume loss. No acute or chronic blood products, hydrocephalus, or mass. Vascular: Major flow voids are preserved Skull and upper cervical spine: Negative Sinuses/Orbits: Bilateral cataract resection IMPRESSION: 1. Punctate acute infarct along the floor of the upper fourth ventricle. 2. Mild for age chronic microvascular ischemia and atrophy. Electronically Signed   By: Monte Fantasia M.D.   On: 02/18/2017 07:47   Dg Chest Port 1 View  Result Date: 02/17/2017 CLINICAL DATA:  fever EXAM: PORTABLE CHEST - 1 VIEW COMPARISON:  07/30/2014 FINDINGS: Lungs are clear. Heart size and mediastinal contours are  within normal limits. Atheromatous aorta. No effusion. Visualized bones unremarkable. IMPRESSION: No acute cardiopulmonary disease. Aortic Atherosclerosis (ICD10-170.0) Electronically Signed   By: Lucrezia Europe M.D.   On: 02/17/2017 20:13     CBC  Recent Labs Lab 02/17/17 0915  WBC 8.1  HGB 11.7*  HCT 34.1*  PLT 222  MCV 89.0  MCH 30.5  MCHC 34.3  RDW 12.7  LYMPHSABS 2.0  MONOABS 0.6  EOSABS 0.2  BASOSABS 0.0    Chemistries   Recent Labs Lab 02/17/17 0915  NA 136  K 3.5  CL 103  CO2 22  GLUCOSE 100*  BUN 26*  CREATININE 1.44*  CALCIUM 10.1   ------------------------------------------------------------------------------------------------------------------ estimated creatinine clearance is 28.5 mL/min (by C-G formula based on SCr of 1.44 mg/dL (H)). ------------------------------------------------------------------------------------------------------------------  Recent Labs  02/17/17 1842  HGBA1C 8.3*   ------------------------------------------------------------------------------------------------------------------  Recent Labs  02/17/17 2355  CHOL 187  HDL 61  LDLCALC 102*  TRIG 121  CHOLHDL 3.1   ------------------------------------------------------------------------------------------------------------------ No results for input(s): TSH, T4TOTAL, T3FREE, THYROIDAB in the last 72 hours.  Invalid input(s): FREET3 ------------------------------------------------------------------------------------------------------------------ No results for input(s): VITAMINB12, FOLATE, FERRITIN, TIBC, IRON, RETICCTPCT in the last 72 hours.  Coagulation profile No results for input(s): INR, PROTIME in the last 168 hours.  No results for input(s): DDIMER in  the last 72 hours.  Cardiac Enzymes  Recent Labs Lab 02/17/17 1842 02/17/17 2232  TROPONINI <0.03 <0.03    ------------------------------------------------------------------------------------------------------------------ Invalid input(s): POCBNP   CBG:  Recent Labs Lab 02/17/17 0838 02/17/17 1808 02/17/17 2124 02/18/17 0635  GLUCAP 92 106* 161* 77       Studies: Ct Head Wo Contrast  Result Date: 02/17/2017 CLINICAL DATA:  Dizziness starting last night EXAM: CT HEAD WITHOUT CONTRAST TECHNIQUE: Contiguous axial images were obtained from the base of the skull through the vertex without intravenous contrast. COMPARISON:  05/19/2010 FINDINGS: Brain: No intracranial hemorrhage, mass effect or midline shift. No acute cortical infarction. No mass lesion is noted on this unenhanced scan. Stable mild cerebral atrophy. Again noted mild periventricular and patchy subcortical white matter decreased attenuation consistent with chronic small vessel ischemic changes. Ventricular size is stable from prior exam. Vascular: Atherosclerotic calcifications of carotid siphon. Skull: No skull fracture is noted.  No bony lesion. Sinuses/Orbits: Mild mucosal thickening with partial opacification right ethmoid air cells. No paranasal sinuses air-fluid levels. Other: None IMPRESSION: No acute intracranial abnormality. Stable atrophy and chronic white matter disease. Atherosclerotic calcifications of carotid siphon again noted. No definite acute cortical infarction. Mucosal thickening with partial opacification right ethmoid air cells. Electronically Signed   By: Lahoma Crocker M.D.   On: 02/17/2017 11:03   Mr Brain Wo Contrast  Result Date: 02/18/2017 CLINICAL DATA:  Dizziness.  Off balance. EXAM: MRI HEAD WITHOUT CONTRAST TECHNIQUE: Multiplanar, multiecho pulse sequences of the brain and surrounding structures were obtained without intravenous contrast. COMPARISON:  Head CT from yesterday FINDINGS: Brain: Punctate acute infarct left para median along the upper floor of the fourth ventricle. Mild microvascular ischemic  change for age in the cerebral white matter and pons. Remote lacunar infarct in the left periatrial white matter. Mild for age generalized volume loss. No acute or chronic blood products, hydrocephalus, or mass. Vascular: Major flow voids are preserved Skull and upper cervical spine: Negative Sinuses/Orbits: Bilateral cataract resection IMPRESSION: 1. Punctate acute infarct along the floor of the upper fourth ventricle. 2. Mild for age chronic microvascular ischemia and atrophy. Electronically Signed   By: Monte Fantasia M.D.   On: 02/18/2017 07:47   Dg Chest Port 1 View  Result Date: 02/17/2017 CLINICAL DATA:  fever EXAM: PORTABLE CHEST - 1 VIEW COMPARISON:  07/30/2014 FINDINGS: Lungs are clear. Heart size and mediastinal contours are within normal limits. Atheromatous aorta. No effusion. Visualized bones unremarkable. IMPRESSION: No acute cardiopulmonary disease. Aortic Atherosclerosis (ICD10-170.0) Electronically Signed   By: Lucrezia Europe M.D.   On: 02/17/2017 20:13      Lab Results  Component Value Date   HGBA1C 8.3 (H) 02/17/2017   HGBA1C 8.5 (H) 01/06/2017   HGBA1C 9.5 (H) 10/07/2016   Lab Results  Component Value Date   LDLCALC 102 (H) 02/17/2017   CREATININE 1.44 (H) 02/17/2017       Scheduled Meds: .  stroke: mapping our early stages of recovery book   Does not apply Once  . amLODipine  10 mg Oral Daily  . aspirin  81 mg Oral Daily  . carvedilol  12.5 mg Oral BID WC  . clopidogrel  75 mg Oral Daily  . enoxaparin (LOVENOX) injection  30 mg Subcutaneous Q24H  . insulin aspart  0-9 Units Subcutaneous TID WC  . insulin glargine  50 Units Subcutaneous QHS  . pantoprazole  40 mg Oral Daily  . pravastatin  10 mg Oral q1800  . scopolamine  1  patch Transdermal Q72H   Continuous Infusions: . sodium chloride Stopped (02/17/17 1940)     LOS: 0 days    Time spent: >30 MINS    [facility]  [facility] Pager [phone]. If 7PM-7AM, please contact night-coverage at  [email or web address], password [name] 02/18/2017, 8:04 AM  LOS: 0 days

## 2017-02-18 NOTE — Progress Notes (Signed)
MRI notified IV re-established.

## 2017-02-18 NOTE — Progress Notes (Signed)
  Echocardiogram 2D Echocardiogram has been performed.  Diamond Nickel 02/18/2017, 9:49 AM

## 2017-02-18 NOTE — Care Management Note (Signed)
Case Management Note  Patient Details  Name: Monique Cabrera MRN: [id number] Date of Birth: 11-25-1935  Subjective/Objective:  Pt admitted with CVA. She is from home.                   Action/Plan: Awaiting PT/OT recommendations. CM following for d/c needs, physician orders.   Expected Discharge Date:                  Expected Discharge Plan:     In-House Referral:     Discharge planning Services     Post Acute Care Choice:    Choice offered to:     DME Arranged:    DME Agency:     HH Arranged:    HH Agency:     Status of Service:  In process, will continue to follow  If discussed at Long Length of Stay Meetings, dates discussed:    Additional Comments:  Pollie Friar, RN 02/18/2017, 1:35 PM

## 2017-02-19 ENCOUNTER — Inpatient Hospital Stay (HOSPITAL_COMMUNITY): Payer: Medicare Other

## 2017-02-19 LAB — GLUCOSE, CAPILLARY
Glucose-Capillary: 158 mg/dL — ABNORMAL HIGH (ref 65–99)
Glucose-Capillary: 225 mg/dL — ABNORMAL HIGH (ref 65–99)

## 2017-02-19 LAB — COMPREHENSIVE METABOLIC PANEL
ALBUMIN: 3.3 g/dL — AB (ref 3.5–5.0)
ALT: 10 U/L — AB (ref 14–54)
AST: 15 U/L (ref 15–41)
Alkaline Phosphatase: 53 U/L (ref 38–126)
Anion gap: 10 (ref 5–15)
BUN: 30 mg/dL — AB (ref 6–20)
CHLORIDE: 103 mmol/L (ref 101–111)
CO2: 23 mmol/L (ref 22–32)
CREATININE: 1.45 mg/dL — AB (ref 0.44–1.00)
Calcium: 9.9 mg/dL (ref 8.9–10.3)
GFR calc Af Amer: 39 mL/min — ABNORMAL LOW (ref >60)
GFR calc non Af Amer: 33 mL/min — ABNORMAL LOW (ref >60)
GLUCOSE: 149 mg/dL — AB (ref 65–99)
POTASSIUM: 3.5 mmol/L (ref 3.5–5.1)
SODIUM: 136 mmol/L (ref 135–145)
Total Bilirubin: 0.5 mg/dL (ref 0.3–1.2)
Total Protein: 6.3 g/dL — ABNORMAL LOW (ref 6.5–8.1)

## 2017-02-19 LAB — CBC
HEMATOCRIT: 32.3 % — AB (ref 36.0–46.0)
Hemoglobin: 10.9 g/dL — ABNORMAL LOW (ref 12.0–15.0)
MCH: 29.9 pg (ref 26.0–34.0)
MCHC: 33.7 g/dL (ref 30.0–36.0)
MCV: 88.5 fL (ref 78.0–100.0)
Platelets: 228 10*3/uL (ref 150–400)
RBC: 3.65 MIL/uL — ABNORMAL LOW (ref 3.87–5.11)
RDW: 12.5 % (ref 11.5–15.5)
WBC: 6.2 10*3/uL (ref 4.0–10.5)

## 2017-02-19 LAB — HEMOGLOBIN A1C
Hgb A1c MFr Bld: 8.4 % — ABNORMAL HIGH (ref 4.8–5.6)
MEAN PLASMA GLUCOSE: 194 mg/dL

## 2017-02-19 MED ORDER — SCOPOLAMINE 1 MG/3DAYS TD PT72: 1.0000 | MEDICATED_PATCH | TRANSDERMAL | 12 refills | Status: DC

## 2017-02-19 MED ORDER — ASPIRIN EC 325 MG PO TBEC: 325.0000 mg | DELAYED_RELEASE_TABLET | Freq: Every day | ORAL | 3 refills | Status: DC

## 2017-02-19 MED ORDER — PRAVASTATIN SODIUM 40 MG PO TABS: 40.0000 mg | ORAL_TABLET | Freq: Every day | ORAL | Status: DC

## 2017-02-19 MED ORDER — ENALAPRIL MALEATE 20 MG PO TABS: 40.0000 mg | ORAL_TABLET | Freq: Every day | ORAL | 0 refills | Status: DC

## 2017-02-19 MED ORDER — IOPAMIDOL (ISOVUE-370) INJECTION 76%
INTRAVENOUS | Status: AC
  Administered 2017-02-19: 50 mL
  Filled 2017-02-19: qty 50

## 2017-02-19 NOTE — Progress Notes (Signed)
SLP Cancellation Note  Patient Details Name: Monique Cabrera MRN: [id number] DOB: 1935/04/22   Cancelled treatment:       Reason Eval/Treat Not Completed: Patient at procedure or test/unavailable  Deneise Lever, MS CF-SLP Speech-Language Pathologist [phone]  Aliene Altes 02/19/2017, 2:49 PM

## 2017-02-19 NOTE — Progress Notes (Signed)
STROKE TEAM PROGRESS NOTE   HISTORY OF PRESENT ILLNESS (per record) Monique Cabrera is an 82 y.o. woman with PMHx of HTN, type 2 DM, hyperlipidemia, and peripheral vascular disease who was admitted on 02/17/17 for vision changes, dizziness, and feeling off balance. She reports her symptoms initially started Wed (2/21) night around 6PM. She describes feeling cross-eyed and that her vision is blurry. She denies double vision. She states when she closes her eyes she feels a spinning sensation but this will improve when she opens her eyes. She noticed she was bumping into things when she was walking and felt she was drifting to the right side. She notes when she covers her left eye she can see normally. She has prior hx of "retinal leakage" and cataract extraction in the left eye and has been told she is legally blind in that eye. She sometimes gets similar symptoms of vision changes and feeling dizzy when her blood sugar is low so she checked her BG and states it was normal. She also checked her BP at the time and this was normal too. She cannot remember the exact values. She describes getting frequent hypoglycemia, several times a week. She reports her DM was doing well until about 1.5 years ago she started to get hypoglycemia and then subsequent hyperglycemia. She denies any associated weakness, changes in speech, or difficulty swallowing. She denies tingling/numbness recently, but states she does get this occasionally in her toes and fingers. She takes aspirin 81 mg daily and plavix 75 mg daily for her peripheral vascular disease.    SUBJECTIVE (INTERVAL HISTORY) Dr. Allyson Sabal is at the bedside.  Pt stated that her vision improved. No dizziness today. Worked with PT and recommend HH. She denies smoking, alcohol or illicit drugs. Follows with PCP for DM control. Pt stated that she is taking ASA 81 and plavix at home.    OBJECTIVE Temp:  [97.9 F (36.6 C)-98.7 F (37.1 C)] 97.9 F (36.6 C) (02/24  0535) Pulse Rate:  [64-85] 64 (02/24 0535) Cardiac Rhythm: Normal sinus rhythm (02/23 1901) Resp:  [18-20] 18 (02/24 0535) BP: (128-173)/(63-92) 142/68 (02/24 0535) SpO2:  [97 %-100 %] 99 % (02/24 0535)  CBC:   Recent Labs Lab 02/17/17 0915 02/19/17 0349  WBC 8.1 6.2  NEUTROABS 5.3  --   HGB 11.7* 10.9*  HCT 34.1* 32.3*  MCV 89.0 88.5  PLT 222 027    Basic Metabolic Panel:   Recent Labs Lab 02/17/17 0915 02/19/17 0349  NA 136 136  K 3.5 3.5  CL 103 103  CO2 22 23  GLUCOSE 100* 149*  BUN 26* 30*  CREATININE 1.44* 1.45*  CALCIUM 10.1 9.9    Lipid Panel:     Component Value Date/Time   CHOL 187 02/17/2017 2355   CHOL 204 (H) 11/24/2015 0938   TRIG 121 02/17/2017 2355   HDL 61 02/17/2017 2355   HDL 53 11/24/2015 0938   CHOLHDL 3.1 02/17/2017 2355   VLDL 24 02/17/2017 2355   LDLCALC 102 (H) 02/17/2017 2355   LDLCALC 125 (H) 11/24/2015 0938   HgbA1c:  Lab Results  Component Value Date   HGBA1C 8.4 (H) 02/17/2017   Urine Drug Screen: No results found for: LABOPIA, COCAINSCRNUR, LABBENZ, AMPHETMU, THCU, LABBARB    IMAGING I have personally reviewed the radiological images below and agree with the radiology interpretations.  Ct Angio Head and Neck W Or Wo Contrast 02/19/2017 1. Atherosclerotic calcifications at the aortic arch in both carotid bifurcations without significant  stenosis.  2. Dense atherosclerotic calcifications within the cavernous internal carotid artery bilaterally without a significant stenosis relative to the ICA terminus.  3. Atherosclerotic changes in the M1 segments bilaterally with approximately 50% narrowing on the left.  4. High-grade stenosis of the left anterior M2 segment.  5. Moderate diffuse distal small vessel disease.  6. Moderate stenoses of proximal P2 segments bilaterally.  7. Atherosclerotic irregularity of the basilar artery without a focal stenosis relative to the more distal vessel.  8. Multilevel spondylosis of the  cervical spine.  9. Moderate generalized atrophy.  10. The brainstem infarct is below the resolution of this study.  11. Dominant left thyroid nodule. Recommend further evaluation with thyroid ultrasound.   If patient is clinically hyperthyroid, consider nuclear medicine thyroid uptake and scan.   Imaging findings of potential clinical significance:  12. Centrilobular emphysema, aortic atherosclerosis   Ct Head Wo Contrast 02/17/2017 No acute intracranial abnormality. Stable atrophy and chronic white matter disease. Atherosclerotic calcifications of carotid siphon again noted. No definite acute cortical infarction. Mucosal thickening with partial opacification right ethmoid air cells.   Mr Brain Wo Contrast 02/18/2017 1. Punctate acute infarct along the floor of the upper fourth ventricle.  2. Mild for age chronic microvascular ischemia and atrophy.   Dg Chest Port 1 View 02/17/2017 No acute cardiopulmonary disease. Aortic Atherosclerosis.  Trans-thoracic echocardiogram 02/18/2017 Study Conclusions - Left ventricle: The cavity size was normal. There was severe   focal basal hypertrophy. Systolic function was normal. The   estimated ejection fraction was in the range of 60% to 65%. Wall   motion was normal; there were no regional wall motion   abnormalities. There was an increased relative contribution of   atrial contraction to ventricular filling. Doppler parameters are   consistent with abnormal left ventricular relaxation (grade 1   diastolic dysfunction). - Aortic valve: Trileaflet; normal thickness, moderately calcified   leaflets. - Pulmonary arteries: PA peak pressure: 33 mm Hg (S).   PHYSICAL EXAM  Temp:  [97.8 F (36.6 C)-98.7 F (37.1 C)] 97.8 F (36.6 C) (02/24 0936) Pulse Rate:  [63-85] 63 (02/24 0936) Resp:  [18-20] 20 (02/24 0936) BP: (128-173)/(54-92) 139/54 (02/24 0936) SpO2:  [97 %-100 %] 100 % (02/24 0936)  General - Well nourished, well developed, in no  apparent distress.  Ophthalmologic - Fundi not visualized due to eye movement.  Cardiovascular - Regular rate and rhythm.  Mental Status -  Level of arousal and orientation to time, place, and person were intact. Language including expression, naming, repetition, comprehension was assessed and found intact. Attention span and concentration were normal. Recent and remote memory were intact. Fund of Knowledge was assessed and was intact.  Cranial Nerves II - XII - II - Visual field intact OU. III, IV, VI - Extraocular movements intact except left eye adduction impaired for both left gaze and convergence. V - Facial sensation intact bilaterally. VII - Facial movement intact bilaterally. VIII - Hearing & vestibular intact bilaterally. X - Palate elevates symmetrically. XI - Chin turning & shoulder shrug intact bilaterally. XII - Tongue protrusion intact.  Motor Strength - The patient's strength was normal in all extremities and pronator drift was absent.  Bulk was normal and fasciculations were absent.   Motor Tone - Muscle tone was assessed at the neck and appendages and was normal.  Reflexes - The patient's reflexes were 1+ in all extremities and she had no pathological reflexes.  Sensory - Light touch, temperature/pinprick were assessed and were  symmetrical.    Coordination - The patient had normal movements in the hands and feet with no ataxia or dysmetria.  Tremor was absent.  Gait and Station - The patient's transfers, posture, gait, station, and turns were observed as normal.   ASSESSMENT/PLAN Monique Cabrera is a 82 y.o. female with history of HTN, DM, hyperlipidemia, CKD, anemia, peripheral vascular disease, and she is legally blind in her left eye, presenting with visual changes, vertigo, and feeling off balance.  She did not receive IV t-PA due to late presentation.  Stroke:  Punctate acute infarct along the floor of the upper fourth ventricle, most likely due to  small vessel disease.   Resultant  Left eye adduction impaired  MRI -  Punctate acute infarct along the floor of the upper fourth ventricle.   CTA H&N - High-grade stenosis of the left anterior M2 segment, diffuse athero.   2D Echo - EF 60-65%. No cardiac source of emboli identified.  LDL - 102  HgbA1c - 8.4  VTE prophylaxis - Lovenox Diet Carb Modified Fluid consistency: Thin; Room service appropriate? Yes  aspirin 81 mg daily and clopidogrel 75 mg daily prior to admission, now on aspirin 81 mg daily and clopidogrel 75 mg daily. Recommend ASA 325mg  and plavix for 3 months and then plavix alone due to intracranial stenosis.  Patient counseled to be compliant with her antithrombotic medications  Ongoing aggressive stroke risk factor management  Therapy recommendations: Home Health PT and OT recommended.  Disposition: Pending  Hypertension  Stable  Permissive hypertension (OK if < 220/120) but gradually normalize in 5-7 days  Long-term BP goal normotensive  Hyperlipidemia  Home meds: Mevacor 20 mg daily resumed in hospital -> Pravachol 10 mg daily  LDL 105, goal < 70  Increase Pravachol to 40 mg daily  Continue statin at discharge  Diabetes  HgbA1c 8.4, goal < 7.0  Uncontrolled  SSI  On lantus  Close PCP follow up for better DM control  Other Stroke Risk Factors  Advanced age  Former smoker  Other Active Problems  Dominant left thyroid nodule - further evaluation recommended  Chronic kidney disease - 30 / 1.45  Anemia - 10.9 / 32.3  Hospital day # 1  Neurology will sign off. Please call with questions. Pt will follow up with Cecille Rubin NP at [name] in about 6 weeks. Thanks for the consult.  Rosalin Hawking, MD PhD Stroke Neurology 02/19/2017 12:52 PM  To contact Stroke Continuity provider, please refer to [email or web address] After hours, contact General Neurology

## 2017-02-19 NOTE — Care Management Note (Signed)
Case Management Note  Patient Details  Name: Monique Cabrera MRN: [id number] Date of Birth: 1935/01/16  Subjective/Objective:                 Spoke with patient. Patient would like to have Page provided with [facility]. Referral made to [facility], clinical liaison for [facility] PT OT [facility]. Patient states she needs RW as well. Referral placed to [name] for RW to be delivered to room prior to DC. Bedside RN updated.    Action/Plan:   Expected Discharge Date:  02/19/17               Expected Discharge Plan:  [place]  In-House Referral:     Discharge planning Services  CM Consult  Post Acute Care Choice:  Durable Medical Equipment, Home Health Choice offered to:  Patient  DME Arranged:  [place] DME Agency:  [place] Arranged:  PT, OT, Nurse's Aide [place] Agency:  [place]  Status of Service:  Completed, signed off  If discussed at [place] of Stay Meetings, dates discussed:    Additional Comments:  Carles Collet, RN 02/19/2017, 10:55 AM

## 2017-02-19 NOTE — Discharge Summary (Addendum)
Physician Discharge Summary  Monique Cabrera MRN: [id number] DOB/AGE: 1935/12/13 82 y.o.  PCP: Hollace Kinnier, DO   Admit date: 02/17/2017 Discharge date: 02/19/2017  Discharge Diagnoses:    Principal Problem:   Acute onset of severe vertigo Active Problems:   Essential hypertension, benign   Chronic kidney disease (CKD), stage II (mild)   Peripheral arterial disease (HCC)   Vertigo   Acute CVA (cerebrovascular accident) ([place])   Acute ischemic stroke ([place])   Internuclear ophthalmoplegia of left eye   Benign paroxysmal positional vertigo   Abnormality of gait    Follow-up recommendations Follow-up with PCP in 3-5 days , including all  additional recommended appointments as below Follow-up CBC, CMP in 3-5 days Patient will follow-up with neurology in 4-6 weeks for her stroke Patient is being discharged with home health Metformin discontinued because of renal insufficiency Monitor renal function closely in the setting of ACE inhibitor and ARB and HCTZ  Patient needs outpatient evaluation for Dominant left thyroid nodule. Recommend further evaluation with thyroid ultrasound. If patient is clinically hyperthyroid, consider nuclear medicine thyroid uptake and scan. She will need endocrinology referral for this to be arranged for by PCP     Current Discharge Medication List    START taking these medications   Details  aspirin EC 325 MG tablet Take 1 tablet (325 mg total) by mouth daily. Qty: 30 tablet, Refills: 3    scopolamine (TRANSDERM-SCOP) 1 MG/3DAYS Place 1 patch (1.5 mg total) onto the skin every 3 (three) days. Qty: 10 patch, Refills: 12      CONTINUE these medications which have CHANGED   Details  enalapril (VASOTEC) 20 MG tablet Take 2 tablets (40 mg total) by mouth daily. Prescribed by kidney specialist Dr.Powell Qty: 30 tablet, Refills: 0      CONTINUE these medications which have NOT CHANGED   Details  amLODipine (NORVASC) 10 MG tablet TAKE ONE TABLET  BY MOUTH DAILY FOR HIGH BLOOD PRESSURE Qty: 90 tablet, Refills: 1    B-D ULTRAFINE III SHORT PEN 31G X 8 MM MISC USE WITH LANTUS EVERY DAY FOR BLOOD SUGAR Qty: 100 each, Refills: 11    carvedilol (COREG) 25 MG tablet Take one tablet by mouth twice daily with a meal Qty: 180 tablet, Refills: 3    clopidogrel (PLAVIX) 75 MG tablet Take one tablet by mouth once daily Qty: 90 tablet, Refills: 3    glucose blood (ONETOUCH VERIO) test strip Use as instructed Qty: 100 each, Refills: 12   Associated Diagnoses: Uncontrolled type 2 diabetes mellitus with stage 3 chronic kidney disease, with long-term current use of insulin (HCC)    Insulin Glargine (LANTUS SOLOSTAR) 100 UNIT/ML Solostar Pen Inject 50 Units into the skin daily. Qty: 15 pen, Refills: 7    insulin lispro (HUMALOG KWIKPEN) 100 UNIT/ML KiwkPen 8 units breakfast, 12 units lunch & supper Qty: 5 pen, Refills: 1   Associated Diagnoses: Uncontrolled type 2 diabetes mellitus with stage 3 chronic kidney disease, with long-term current use of insulin (HCC)    lovastatin (MEVACOR) 20 MG tablet TAKE 1 TABLET (20 MG TOTAL) BY MOUTH AT BEDTIME FOR CHOLESTEROL. Qty: 30 tablet, Refills: 2   Associated Diagnoses: Hyperlipidemia associated with type 2 diabetes mellitus (HCC)    pantoprazole (PROTONIX) 40 MG tablet Take 1 tablet (40 mg total) by mouth daily. Qty: 90 tablet, Refills: 3    valsartan-hydrochlorothiazide (DIOVAN-HCT) 320-25 MG tablet Take one tablet by mouth once daily Qty: 90 tablet, Refills: 3  STOP taking these medications     aspirin 81 MG chewable tablet      metFORMIN (GLUCOPHAGE) 500 MG tablet      naproxen sodium (ANAPROX) 220 MG tablet          Discharge Condition: Stable   Discharge Instructions Get Medicines reviewed and adjusted: Please take all your medications with you for your next visit with your Primary MD  Please request your Primary MD to go over all hospital tests and procedure/radiological  results at the follow up, please ask your Primary MD to get all Hospital records sent to his/her office.  If you experience worsening of your admission symptoms, develop shortness of breath, life threatening emergency, suicidal or homicidal thoughts you must seek medical attention immediately by calling 911 or calling your MD immediately if symptoms less severe.  You must read complete instructions/literature along with all the possible adverse reactions/side effects for all the Medicines you take and that have been prescribed to you. Take any new Medicines after you have completely understood and accpet all the possible adverse reactions/side effects.   Do not drive when taking Pain medications.   Do not take more than prescribed Pain, Sleep and Anxiety Medications  Special Instructions: If you have smoked or chewed Tobacco in the last 2 yrs please stop smoking, stop any regular Alcohol and or any Recreational drug use.  Wear Seat belts while driving.  Please note  You were cared for by a hospitalist during your hospital stay. Once you are discharged, your primary care physician will handle any further medical issues. Please note that NO REFILLS for any discharge medications will be authorized once you are discharged, as it is imperative that you return to your primary care physician (or establish a relationship with a primary care physician if you do not have one) for your aftercare needs so that they can reassess your need for medications and monitor your lab values.     Allergies  Allergen Reactions  . Invokana [Canagliflozin] Other (See Comments)    Vagina itching and swelling Vaginal Itching and irritation   . Jardiance [Empagliflozin] Itching and Other (See Comments)    Vaginal itching and swelling      Disposition: 01-Home or Self Care   Consults:  Neurology     Significant Diagnostic Studies:  Ct Angio Head W Or Wo Contrast  Result Date: 02/19/2017 CLINICAL  DATA:  Recent brainstem stroke and vertigo. Hypertension and dizziness. EXAM: CT ANGIOGRAPHY HEAD AND NECK TECHNIQUE: Multidetector CT imaging of the head and neck was performed using the standard protocol during bolus administration of intravenous contrast. Multiplanar CT image reconstructions and MIPs were obtained to evaluate the vascular anatomy. Carotid stenosis measurements (when applicable) are obtained utilizing NASCET criteria, using the distal internal carotid diameter as the denominator. CONTRAST:  MRI of the brain 02/18/2017. COMPARISON:  MRI of the brain 02/18/2017 FINDINGS: CT HEAD FINDINGS Brain: Moderate generalized atrophy is again seen. Basal ganglia and insular cortex are intact. The brainstem infarct is below the resolution of CT. Mild white matter changes are evident. The ventricles are of normal size. No significant extra-axial fluid collection is present. Vascular: Dense atherosclerotic calcifications are present within the cavernous internal carotid artery is. These extend into the M1 segments bilaterally. There is no hyperdense vessel. Skull: The calvarium is within normal limits. No focal lytic or blastic lesions are present. Sinuses: An anterior right ethmoid air cell is opacified. Mild mucosal thickening is present in the ethmoid air cells bilaterally.  There is mild mucosal thickening in the right maxillary sinus as well. No fluid levels are present. The paranasal sinuses and mastoid air cells are otherwise clear. Orbits: Bilateral lens replacements are present. The globes and orbits are otherwise within normal limits. Review of the MIP images confirms the above findings CTA NECK FINDINGS Aortic arch: A 3 vessel arch configuration is present. Atherosclerotic calcifications are present at the arch without aneurysm or focal stenosis of the great vessels. Right carotid system: Right common carotid artery is tortuous without significant stenosis. Mild atherosclerotic changes are present at  the right carotid bifurcation. There is no significant stenosis. The cervical right ICA demonstrates mild tortuosity but is otherwise normal. Left carotid system: The left common carotid artery is mildly tortuous. Atherosclerotic calcifications are present at the carotid bifurcation without a significant stenosis. There is mild a moderate tortuosity of the cervical left ICA is well without significant stenosis. Vertebral arteries: The vertebral arteries originate from the subclavian arteries bilaterally. The left vertebral artery is the dominant vessel. Moderate tortuosity is present in the right vertebral artery with focal kinking at the C4-5 foramen. There is no focal stenosis. Skeleton: Chronic endplate changes and loss of disc height are present at C5-6 and C6-7. Facet spurring is greatest at C4-5 with grade 1 anterolisthesis. Osseous foraminal narrowing is present on the right at C4-5 and C5-6 in particular. No focal lytic or blastic lesions are present. The patient is edentulous. Other neck: A dominant nodule within the left lobe of the thyroid measures 18 x 12 x 12 mm. No significant cervical adenopathy is present. The salivary glands are within normal limits. Upper chest: Centrilobular emphysema is evident. An 8 mm spiculated nodule is present in the left upper lobe. No other focal nodule or mass lesion is present. No significant mediastinal adenopathy is present. Review of the MIP images confirms the above findings CTA HEAD FINDINGS Anterior circulation: Dense atherosclerotic calcifications are present within the cavernous internal carotid arteries bilaterally without a significant stenosis through the ICA termini. Atherosclerotic irregularity is present within the M1 segments, left greater than right. A 50% stenosis is present in the mid left M1 segment. Anterior communicating artery is patent. MCA bifurcations are intact bilaterally. There is a high-grade stenosis in the proximal left anterior left M2  segment. There is moderate attenuation of distal ACA branch vessels bilaterally without a significant proximal stenosis. Distal MCA narrowing is present bilaterally as well. Posterior circulation: Left vertebral artery is the dominant vessel. PICA origins are visualized and normal. There is mild atherosclerotic change in the basilar artery without a significant stenosis relative to the more distal vessel. Both posterior cerebral arteries originate from the basilar tip. There is moderate proximal P2 segment stenosis bilaterally. There is significant attenuation of distal PCA branch vessels bilaterally. Venous sinuses: The dural sinuses are patent. The right transverse sinus is dominant. The straight sinus and deep cerebral veins are intact. The cortical veins are unremarkable. Anatomic variants: None Delayed phase: The postcontrast images demonstrate no pathologic enhancement. Review of the MIP images confirms the above findings IMPRESSION: 1. Atherosclerotic calcifications at the aortic arch in both carotid bifurcations without significant stenosis. 2. Dense atherosclerotic calcifications within the cavernous internal carotid artery bilaterally without a significant stenosis relative to the ICA terminus. 3. Atherosclerotic changes in the M1 segments bilaterally with approximately 50% narrowing on the left. 4. High-grade stenosis of the left anterior M2 segment. 5. Moderate diffuse distal small vessel disease. 6. Moderate stenoses of proximal P2 segments bilaterally.  7. Atherosclerotic irregularity of the basilar artery without a focal stenosis relative to the more distal vessel. 8. Multilevel spondylosis of the cervical spine. 9. Moderate generalized atrophy. 10. The brainstem infarct is below the resolution of this study. 11. Dominant left thyroid nodule. Recommend further evaluation with thyroid ultrasound. If patient is clinically hyperthyroid, consider nuclear medicine thyroid uptake and scan. Imaging findings  of potential clinical significance: Centrilobular emphysema, aortic atherosclerosis Electronically Signed   By: San Morelle M.D.   On: 02/19/2017 07:22   Ct Head Wo Contrast  Result Date: 02/17/2017 CLINICAL DATA:  Dizziness starting last night EXAM: CT HEAD WITHOUT CONTRAST TECHNIQUE: Contiguous axial images were obtained from the base of the skull through the vertex without intravenous contrast. COMPARISON:  05/19/2010 FINDINGS: Brain: No intracranial hemorrhage, mass effect or midline shift. No acute cortical infarction. No mass lesion is noted on this unenhanced scan. Stable mild cerebral atrophy. Again noted mild periventricular and patchy subcortical white matter decreased attenuation consistent with chronic small vessel ischemic changes. Ventricular size is stable from prior exam. Vascular: Atherosclerotic calcifications of carotid siphon. Skull: No skull fracture is noted.  No bony lesion. Sinuses/Orbits: Mild mucosal thickening with partial opacification right ethmoid air cells. No paranasal sinuses air-fluid levels. Other: None IMPRESSION: No acute intracranial abnormality. Stable atrophy and chronic white matter disease. Atherosclerotic calcifications of carotid siphon again noted. No definite acute cortical infarction. Mucosal thickening with partial opacification right ethmoid air cells. Electronically Signed   By: Lahoma Crocker M.D.   On: 02/17/2017 11:03   Ct Angio Neck W Or Wo Contrast  Result Date: 02/19/2017 CLINICAL DATA:  Recent brainstem stroke and vertigo. Hypertension and dizziness. EXAM: CT ANGIOGRAPHY HEAD AND NECK TECHNIQUE: Multidetector CT imaging of the head and neck was performed using the standard protocol during bolus administration of intravenous contrast. Multiplanar CT image reconstructions and MIPs were obtained to evaluate the vascular anatomy. Carotid stenosis measurements (when applicable) are obtained utilizing NASCET criteria, using the distal internal carotid  diameter as the denominator. CONTRAST:  MRI of the brain 02/18/2017. COMPARISON:  MRI of the brain 02/18/2017 FINDINGS: CT HEAD FINDINGS Brain: Moderate generalized atrophy is again seen. Basal ganglia and insular cortex are intact. The brainstem infarct is below the resolution of CT. Mild white matter changes are evident. The ventricles are of normal size. No significant extra-axial fluid collection is present. Vascular: Dense atherosclerotic calcifications are present within the cavernous internal carotid artery is. These extend into the M1 segments bilaterally. There is no hyperdense vessel. Skull: The calvarium is within normal limits. No focal lytic or blastic lesions are present. Sinuses: An anterior right ethmoid air cell is opacified. Mild mucosal thickening is present in the ethmoid air cells bilaterally. There is mild mucosal thickening in the right maxillary sinus as well. No fluid levels are present. The paranasal sinuses and mastoid air cells are otherwise clear. Orbits: Bilateral lens replacements are present. The globes and orbits are otherwise within normal limits. Review of the MIP images confirms the above findings CTA NECK FINDINGS Aortic arch: A 3 vessel arch configuration is present. Atherosclerotic calcifications are present at the arch without aneurysm or focal stenosis of the great vessels. Right carotid system: Right common carotid artery is tortuous without significant stenosis. Mild atherosclerotic changes are present at the right carotid bifurcation. There is no significant stenosis. The cervical right ICA demonstrates mild tortuosity but is otherwise normal. Left carotid system: The left common carotid artery is mildly tortuous. Atherosclerotic calcifications are present at  the carotid bifurcation without a significant stenosis. There is mild a moderate tortuosity of the cervical left ICA is well without significant stenosis. Vertebral arteries: The vertebral arteries originate from the  subclavian arteries bilaterally. The left vertebral artery is the dominant vessel. Moderate tortuosity is present in the right vertebral artery with focal kinking at the C4-5 foramen. There is no focal stenosis. Skeleton: Chronic endplate changes and loss of disc height are present at C5-6 and C6-7. Facet spurring is greatest at C4-5 with grade 1 anterolisthesis. Osseous foraminal narrowing is present on the right at C4-5 and C5-6 in particular. No focal lytic or blastic lesions are present. The patient is edentulous. Other neck: A dominant nodule within the left lobe of the thyroid measures 18 x 12 x 12 mm. No significant cervical adenopathy is present. The salivary glands are within normal limits. Upper chest: Centrilobular emphysema is evident. An 8 mm spiculated nodule is present in the left upper lobe. No other focal nodule or mass lesion is present. No significant mediastinal adenopathy is present. Review of the MIP images confirms the above findings CTA HEAD FINDINGS Anterior circulation: Dense atherosclerotic calcifications are present within the cavernous internal carotid arteries bilaterally without a significant stenosis through the ICA termini. Atherosclerotic irregularity is present within the M1 segments, left greater than right. A 50% stenosis is present in the mid left M1 segment. Anterior communicating artery is patent. MCA bifurcations are intact bilaterally. There is a high-grade stenosis in the proximal left anterior left M2 segment. There is moderate attenuation of distal ACA branch vessels bilaterally without a significant proximal stenosis. Distal MCA narrowing is present bilaterally as well. Posterior circulation: Left vertebral artery is the dominant vessel. PICA origins are visualized and normal. There is mild atherosclerotic change in the basilar artery without a significant stenosis relative to the more distal vessel. Both posterior cerebral arteries originate from the basilar tip. There  is moderate proximal P2 segment stenosis bilaterally. There is significant attenuation of distal PCA branch vessels bilaterally. Venous sinuses: The dural sinuses are patent. The right transverse sinus is dominant. The straight sinus and deep cerebral veins are intact. The cortical veins are unremarkable. Anatomic variants: None Delayed phase: The postcontrast images demonstrate no pathologic enhancement. Review of the MIP images confirms the above findings IMPRESSION: 1. Atherosclerotic calcifications at the aortic arch in both carotid bifurcations without significant stenosis. 2. Dense atherosclerotic calcifications within the cavernous internal carotid artery bilaterally without a significant stenosis relative to the ICA terminus. 3. Atherosclerotic changes in the M1 segments bilaterally with approximately 50% narrowing on the left. 4. High-grade stenosis of the left anterior M2 segment. 5. Moderate diffuse distal small vessel disease. 6. Moderate stenoses of proximal P2 segments bilaterally. 7. Atherosclerotic irregularity of the basilar artery without a focal stenosis relative to the more distal vessel. 8. Multilevel spondylosis of the cervical spine. 9. Moderate generalized atrophy. 10. The brainstem infarct is below the resolution of this study. 11. Dominant left thyroid nodule. Recommend further evaluation with thyroid ultrasound. If patient is clinically hyperthyroid, consider nuclear medicine thyroid uptake and scan. Imaging findings of potential clinical significance: Centrilobular emphysema, aortic atherosclerosis Electronically Signed   By: San Morelle M.D.   On: 02/19/2017 07:22   Mr Brain Wo Contrast  Result Date: 02/18/2017 CLINICAL DATA:  Dizziness.  Off balance. EXAM: MRI HEAD WITHOUT CONTRAST TECHNIQUE: Multiplanar, multiecho pulse sequences of the brain and surrounding structures were obtained without intravenous contrast. COMPARISON:  Head CT from yesterday FINDINGS: Brain: Punctate  acute infarct left para median along the upper floor of the fourth ventricle. Mild microvascular ischemic change for age in the cerebral white matter and pons. Remote lacunar infarct in the left periatrial white matter. Mild for age generalized volume loss. No acute or chronic blood products, hydrocephalus, or mass. Vascular: Major flow voids are preserved Skull and upper cervical spine: Negative Sinuses/Orbits: Bilateral cataract resection IMPRESSION: 1. Punctate acute infarct along the floor of the upper fourth ventricle. 2. Mild for age chronic microvascular ischemia and atrophy. Electronically Signed   By: Monte Fantasia M.D.   On: 02/18/2017 07:47   Dg Chest Port 1 View  Result Date: 02/17/2017 CLINICAL DATA:  fever EXAM: PORTABLE CHEST - 1 VIEW COMPARISON:  07/30/2014 FINDINGS: Lungs are clear. Heart size and mediastinal contours are within normal limits. Atheromatous aorta. No effusion. Visualized bones unremarkable. IMPRESSION: No acute cardiopulmonary disease. Aortic Atherosclerosis (ICD10-170.0) Electronically Signed   By: Lucrezia Europe M.D.   On: 02/17/2017 20:13    2-D echo LV EF: 60% -   65%  ------------------------------------------------------------------- Indications:      CVA 436.  ------------------------------------------------------------------- History:   PMH:  Chronic kidney disease. Anemia.  Risk factors: Hypertension. Diabetes mellitus.  ------------------------------------------------------------------- Study Conclusions  - Left ventricle: The cavity size was normal. There was severe   focal basal hypertrophy. Systolic function was normal. The   estimated ejection fraction was in the range of 60% to 65%. Wall   motion was normal; there were no regional wall motion   abnormalities. There was an increased relative contribution of   atrial contraction to ventricular filling. Doppler parameters are   consistent with abnormal left ventricular relaxation (grade 1    diastolic dysfunction). - Aortic valve: Trileaflet; normal thickness, moderately calcified   leaflets. - Pulmonary arteries: PA peak pressure: 33 mm Hg (S).   Filed Weights   02/17/17 0835  Weight: 65.3 kg (144 lb)     Microbiology: No results found for this or any previous visit (from the past 240 hour(s)).     Blood Culture    Component Value Date/Time   SDES ABSCESS EAR LEFT 05/27/2014 1424   SPECREQUEST NONE 05/27/2014 1424   CULT  05/27/2014 1424    FEW STAPHYLOCOCCUS SPECIES (COAGULASE NEGATIVE) Performed at [facility] 05/30/2014 FINAL 05/27/2014 1424      Labs: Results for orders placed or performed during the hospital encounter of 02/17/17 (from the past 48 hour(s))  Urinalysis, Routine w reflex microscopic     Status: Abnormal   Collection Time: 02/17/17 12:00 PM  Result Value Ref Range   Color, Urine STRAW (A) YELLOW   APPearance CLEAR CLEAR   Specific Gravity, Urine 1.006 1.005 - 1.030   pH 6.0 5.0 - 8.0   Glucose, UA NEGATIVE NEGATIVE mg/dL   Hgb urine dipstick NEGATIVE NEGATIVE   Bilirubin Urine NEGATIVE NEGATIVE   Ketones, ur NEGATIVE NEGATIVE mg/dL   Protein, ur 30 (A) NEGATIVE mg/dL   Nitrite NEGATIVE NEGATIVE   Leukocytes, UA NEGATIVE NEGATIVE   RBC / HPF NONE SEEN 0 - 5 RBC/hpf   WBC, UA 0-5 0 - 5 WBC/hpf   Bacteria, UA RARE (A) NONE SEEN   Squamous Epithelial / LPF 0-5 (A) NONE SEEN   Mucous PRESENT   Glucose, capillary     Status: Abnormal   Collection Time: 02/17/17  6:08 PM  Result Value Ref Range   Glucose-Capillary 106 (H) 65 - 99 mg/dL  Troponin I  Status: None   Collection Time: 02/17/17  6:42 PM  Result Value Ref Range   Troponin I <0.03 <0.03 ng/mL  Hemoglobin A1c     Status: Abnormal   Collection Time: 02/17/17  6:42 PM  Result Value Ref Range   Hgb A1c MFr Bld 8.3 (H) 4.8 - 5.6 %    Comment: (NOTE)         Pre-diabetes: 5.7 - 6.4         Diabetes: >6.4         Glycemic control for adults with  diabetes: <7.0    Mean Plasma Glucose 192     Comment: (NOTE) Performed At: [facility] [place], [place] [id number] Lindon Romp MD UV:[id number]   Glucose, capillary     Status: Abnormal   Collection Time: 02/17/17  9:24 PM  Result Value Ref Range   Glucose-Capillary 161 (H) 65 - 99 mg/dL   Comment 1 Notify RN    Comment 2 Document in Chart   Troponin I     Status: None   Collection Time: 02/17/17 10:32 PM  Result Value Ref Range   Troponin I <0.03 <0.03 ng/mL  Hemoglobin A1c     Status: Abnormal   Collection Time: 02/17/17 11:54 PM  Result Value Ref Range   Hgb A1c MFr Bld 8.4 (H) 4.8 - 5.6 %    Comment: (NOTE)         Pre-diabetes: 5.7 - 6.4         Diabetes: >6.4         Glycemic control for adults with diabetes: <7.0    Mean Plasma Glucose 194 mg/dL    Comment: (NOTE) Performed At: [name] [place], [place] [id number] Lindon Romp MD [id number]   Lipid panel     Status: Abnormal   Collection Time: 02/17/17 11:55 PM  Result Value Ref Range   Cholesterol 187 0 - 200 mg/dL   Triglycerides 121 <150 mg/dL   HDL 61 >40 mg/dL   Total CHOL/HDL Ratio 3.1 RATIO   VLDL 24 0 - 40 mg/dL   LDL Cholesterol 102 (H) 0 - 99 mg/dL    Comment:        Total Cholesterol/HDL:CHD Risk Coronary Heart Disease Risk Table                     Men   Women  1/2 Average Risk   3.4   3.3  Average Risk       5.0   4.4  2 X Average Risk   9.6   7.1  3 X Average Risk  23.4   11.0        Use the calculated Patient Ratio above and the CHD Risk Table to determine the patient's CHD Risk.        ATP III CLASSIFICATION (LDL):  <100     mg/dL   Optimal  100-129  mg/dL   Near or Above                    Optimal  130-159  mg/dL   Borderline  160-189  mg/dL   High  >190     mg/dL   Very High   Glucose, capillary     Status: None   Collection Time: 02/18/17  6:35 AM  Result Value Ref Range   Glucose-Capillary 77 65 - 99 mg/dL    Comment 1 Notify RN  Comment 2 Document in Chart   Glucose, capillary     Status: Abnormal   Collection Time: 02/18/17 11:02 AM  Result Value Ref Range   Glucose-Capillary 179 (H) 65 - 99 mg/dL  Vitamin B12     Status: None   Collection Time: 02/18/17 12:30 PM  Result Value Ref Range   Vitamin B-12 274 180 - 914 pg/mL    Comment: (NOTE) This assay is not validated for testing neonatal or myeloproliferative syndrome specimens for Vitamin B12 levels.   Glucose, capillary     Status: Abnormal   Collection Time: 02/18/17  4:17 PM  Result Value Ref Range   Glucose-Capillary 321 (H) 65 - 99 mg/dL  Glucose, capillary     Status: Abnormal   Collection Time: 02/18/17 10:38 PM  Result Value Ref Range   Glucose-Capillary 102 (H) 65 - 99 mg/dL   Comment 1 Notify RN    Comment 2 Document in Chart   Comprehensive metabolic panel     Status: Abnormal   Collection Time: 02/19/17  3:49 AM  Result Value Ref Range   Sodium 136 135 - 145 mmol/L   Potassium 3.5 3.5 - 5.1 mmol/L   Chloride 103 101 - 111 mmol/L   CO2 23 22 - 32 mmol/L   Glucose, Bld 149 (H) 65 - 99 mg/dL   BUN 30 (H) 6 - 20 mg/dL   Creatinine, Ser 1.45 (H) 0.44 - 1.00 mg/dL   Calcium 9.9 8.9 - 10.3 mg/dL   Total Protein 6.3 (L) 6.5 - 8.1 g/dL   Albumin 3.3 (L) 3.5 - 5.0 g/dL   AST 15 15 - 41 U/L   ALT 10 (L) 14 - 54 U/L   Alkaline Phosphatase 53 38 - 126 U/L   Total Bilirubin 0.5 0.3 - 1.2 mg/dL   GFR calc non Af Amer 33 (L) >60 mL/min   GFR calc Af Amer 39 (L) >60 mL/min    Comment: (NOTE) The eGFR has been calculated using the CKD EPI equation. This calculation has not been validated in all clinical situations. eGFR's persistently <60 mL/min signify possible Chronic Kidney Disease.    Anion gap 10 5 - 15  CBC     Status: Abnormal   Collection Time: 02/19/17  3:49 AM  Result Value Ref Range   WBC 6.2 4.0 - 10.5 K/uL   RBC 3.65 (L) 3.87 - 5.11 MIL/uL   Hemoglobin 10.9 (L) 12.0 - 15.0 g/dL   HCT 32.3 (L) 36.0 - 46.0  %   MCV 88.5 78.0 - 100.0 fL   MCH 29.9 26.0 - 34.0 pg   MCHC 33.7 30.0 - 36.0 g/dL   RDW 12.5 11.5 - 15.5 %   Platelets 228 150 - 400 K/uL  Glucose, capillary     Status: Abnormal   Collection Time: 02/19/17  6:49 AM  Result Value Ref Range   Glucose-Capillary 158 (H) 65 - 99 mg/dL     Lipid Panel     Component Value Date/Time   CHOL 187 02/17/2017 2355   CHOL 204 (H) 11/24/2015 0938   TRIG 121 02/17/2017 2355   HDL 61 02/17/2017 2355   HDL 53 11/24/2015 0938   CHOLHDL 3.1 02/17/2017 2355   VLDL 24 02/17/2017 2355   LDLCALC 102 (H) 02/17/2017 2355   LDLCALC 125 (H) 11/24/2015 0938     Lab Results  Component Value Date   HGBA1C 8.4 (H) 02/17/2017   HGBA1C 8.3 (H) 02/17/2017   HGBA1C 8.5 (H) 01/06/2017  HPI :  82 year old female with a history of diabetes, hypertension, chronic kidney disease, peripheral arterial disease presented with acute onset of dizziness, unsteady gait, left facial pain and left ear pain. Onset, last night, associated with double vision and imbalance. Patient has been   fasting for today's visit her church members . Apparently her sugars have been running high at home . She has been afebrile, denies palpitations, chest pain, shortness of breath, nausea vomiting abdominal pain. Patient denies any recent falls. No tinnitus  Workup in the ER shows, blood pressure elevated, 660-630 systolic, a creatinine that baseline. 1.5, sodium 136, potassium 3.5, white blood cell count within normal limits, hemoglobin 11.7, UA negative, CT of the head negative. MRI of the brain was attempted after being administered some Ativan which made the patient even more restless.  HOSPITAL COURSE:   Acute onset of severe vertigo 1. Acute ischemic stroke 2. Left internuclear ophthalmoplegia Given vertiginous symptoms and unsteady gait she  was admitted to telemetry Hydrated with IV fluids, given meclizine and scopolamine patch Telemetry within normal limits Repeat MRI  positive Punctate acute infarct along the floor of the upper fourth ventricle. Neurology consulted CT angina head and neck did not show any major large vessel occlusion TTE shows severe focal basal hypertrophy of the left ventricle; ejection fraction 60-65 percent; grade 1 diastolic dysfunction Neurology recommended to continue aspirin 325 mg  and Plavix 75 mg  for the stroke for at least 3 months Patient follow-up with neurology in 4-6 weeks PTOT evaluation ,-recommended home health      Essential hypertension, benign On multiple antihypertensive medications, creatinine at baseline is 1.2, now 1.4 Uncontrolled on admission, as the patient had not taken any of her regular medications cautiously continue home medications, with close monitoring of her renal function    Chronic kidney disease (CKD), stage II (mild) Baseline creatinine appears to be 1.4,     Peripheral arterial disease (HCC)-continue aspirin and Plavix  Diabetes mellitus type 2 Hemoglobin A1c 8.4, continue Lantus, NovoLog Discontinued Glucophage due to renal insufficiency    Dyslipidemia continue statin LDL 102    Discharge Exam:   Blood pressure (!) 142/68, pulse 64, temperature 97.9 F (36.6 C), temperature source Oral, resp. rate 18, height '5\' 5"'$  (1.651 m), weight 65.3 kg (144 lb), SpO2 99 %.  Cardiovascular: Heart is regular without murmur. No carotid bruits. Lungs: Clear to auscultation bilaterally. Neuro: Cranial nerves: She appears to have a left internuclear ophthalmoplegia. The remainder of her cranial nerve examination is unremarkable. Motor: Shows normal bulk, tone, strength throughout. No tremor. No drift. Sensation: This is intact to light touch. Coordination: Finger-nose and heel-to-shin are without dysmetria. DTRs: 2+ in the upper extremities, absent in the lower extremities. Toes are downgoing. Gait: She was observed ambulating with physical therapy. Initially, she was walking well  but as she continue walking seems to develop more unsteadiness with a tendency to drift to her right.    Follow-up Information    REED, TIFFANY, DO. Call.   Specialty:  Geriatric Medicine Why:  Hospital follow-up Contact information: [address] [place] [address] [phone]        Xu,Jindong, MD. Call.   Specialty:  Neurology Why:  To make follow-up appointment. You would need to see him in 4-6 weeks Contact information: [address] [place] [address] [phone]           Signed: Reyne Dumas 02/19/2017, 9:24 AM        Time spent >  45 mins

## 2017-02-19 NOTE — Progress Notes (Signed)
Occupational Therapy Treatment Patient Details Name: Monique Cabrera MRN: [id number] DOB: 15-Mar-1935 Today's Date: 02/19/2017    History of present illness SHARLYN ODONNEL is an 82 y.o. woman with PMHx of HTN, type 2 DM, hyperlipidemia, and peripheral vascular disease who was admitted on 02/17/17 for vision changes, dizziness, and feeling off balance. MRI: Punctate acute infarct left para median along the floor of the upper fourth ventricle and a remote infarct in left periatrial white matter   OT comments  Pt able to perform ADLs with supervision.  Reviewed walker safety, safe technique for tub transfer and need to sit to shower as well as recommendation for direct supervision with cooking, medication management and that she is to use RW at all times.  Follow Up Recommendations  Home health OT;Supervision/Assistance - 24 hour    Equipment Recommendations  None recommended by OT    Recommendations for Other Services      Precautions / Restrictions Precautions Precautions: Fall       Mobility Bed Mobility                  Transfers Overall transfer level: Needs assistance Equipment used: Rolling walker (2 wheeled);None Transfers: Sit to/from [employer] to Stand: Supervision Stand pivot transfers: Min guard            Balance Overall balance assessment: Needs assistance Sitting-balance support: No upper extremity supported;Feet supported Sitting balance-Leahy Scale: Good     Standing balance support: No upper extremity supported Standing balance-Leahy Scale: Fair                     ADL Overall ADL's : Needs assistance/impaired         Upper Body Bathing: Set up   Lower Body Bathing: Supervison/ safety;Sit to/from stand                   Tub/ Shower Transfer: Min guard;Shower seat;Ambulation Tub/Shower Transfer Details (indicate cue type and reason): Pt instructed in safe techniqe for tub transfer.  Discussed  options for tub seats.  She has fold down chair she plans to use at home.  Recommended to her that she not attempt showering unless seated due to risk of falls  Functional mobility during ADLs: Supervision/safety;Rolling walker General ADL Comments: Discussed safety concerns with pt and that it is recommended she have DIRECT supervision with cooking, medication management, and that she use the RW at all times at home.  She verbalized understanding       Vision                 Additional Comments: pt with dysconjugate gaze.  Lt eye is sluggish during pursuits.  Pt reports shadowing of objects which clears with one eye closed.  She reports she has a h/o visual deficits including being legally blind in Lt eye.  She is unable to read regular sized print with her glasses, but reports she utilized a magnifier PTA.   Pt able to negotiate around her room without running into objects today, and will have assist at home.    Perception     Praxis      Cognition   Behavior During Therapy: WFL for tasks assessed/performed Overall Cognitive Status: Impaired/Different from baseline Area of Impairment: Attention;Following commands;Problem solving   Current Attention Level: Selective    Following Commands: Follows multi-step commands consistently (with cues ) Safety/Judgement: Decreased awareness of safety Awareness: Intellectual Problem Solving: Difficulty sequencing;Requires verbal cues General  Comments: Pt requires information to be presented repeatedly at times       Exercises     Shoulder Instructions       General Comments      Pertinent Vitals/ Pain       Pain Assessment: No/denies pain  Home Living                                          Prior Functioning/Environment              Frequency  Min 2X/week        Progress Toward Goals  OT Goals(current goals can now be found in the care plan section)  Progress towards OT goals: Progressing  toward goals     Plan Discharge plan remains appropriate    Co-evaluation                 End of Session Equipment Utilized During Treatment: Rolling walker  OT Visit Diagnosis: Unsteadiness on feet (R26.81)   Activity Tolerance Patient tolerated treatment well   Patient Left in chair;with call bell/phone within reach;with chair alarm set   Nurse Communication Mobility status        Time: [id number] OT Time Calculation (min): 26 min  Charges: OT Treatments $Self Care/Home Management : 23-37 mins  [facility], OTR/L [phone]    Lucille Passy M 02/19/2017, 3:55 PM

## 2017-02-21 DIAGNOSIS — Z7982 Long term (current) use of aspirin: Secondary | ICD-10-CM | POA: Diagnosis not present

## 2017-02-21 DIAGNOSIS — E1169 Type 2 diabetes mellitus with other specified complication: Secondary | ICD-10-CM | POA: Diagnosis not present

## 2017-02-21 DIAGNOSIS — E1122 Type 2 diabetes mellitus with diabetic chronic kidney disease: Secondary | ICD-10-CM | POA: Diagnosis not present

## 2017-02-21 DIAGNOSIS — H5122 Internuclear ophthalmoplegia, left eye: Secondary | ICD-10-CM | POA: Diagnosis not present

## 2017-02-21 DIAGNOSIS — H8110 Benign paroxysmal vertigo, unspecified ear: Secondary | ICD-10-CM | POA: Diagnosis not present

## 2017-02-21 DIAGNOSIS — E1151 Type 2 diabetes mellitus with diabetic peripheral angiopathy without gangrene: Secondary | ICD-10-CM | POA: Diagnosis not present

## 2017-02-21 DIAGNOSIS — I129 Hypertensive chronic kidney disease with stage 1 through stage 4 chronic kidney disease, or unspecified chronic kidney disease: Secondary | ICD-10-CM | POA: Diagnosis not present

## 2017-02-21 DIAGNOSIS — R2689 Other abnormalities of gait and mobility: Secondary | ICD-10-CM | POA: Diagnosis not present

## 2017-02-21 DIAGNOSIS — Z794 Long term (current) use of insulin: Secondary | ICD-10-CM | POA: Diagnosis not present

## 2017-02-21 DIAGNOSIS — I69398 Other sequelae of cerebral infarction: Secondary | ICD-10-CM | POA: Diagnosis not present

## 2017-02-21 DIAGNOSIS — Z7902 Long term (current) use of antithrombotics/antiplatelets: Secondary | ICD-10-CM | POA: Diagnosis not present

## 2017-02-21 DIAGNOSIS — N182 Chronic kidney disease, stage 2 (mild): Secondary | ICD-10-CM | POA: Diagnosis not present

## 2017-02-21 DIAGNOSIS — E785 Hyperlipidemia, unspecified: Secondary | ICD-10-CM | POA: Diagnosis not present

## 2017-02-21 LAB — FOLATE RBC
FOLATE, RBC: 1343 ng/mL (ref >498)
Folate, Hemolysate: 480.9 ng/mL
Hematocrit: 35.8 % (ref 34.0–46.6)

## 2017-02-23 DIAGNOSIS — N182 Chronic kidney disease, stage 2 (mild): Secondary | ICD-10-CM | POA: Diagnosis not present

## 2017-02-23 DIAGNOSIS — E1151 Type 2 diabetes mellitus with diabetic peripheral angiopathy without gangrene: Secondary | ICD-10-CM | POA: Diagnosis not present

## 2017-02-23 DIAGNOSIS — Z7982 Long term (current) use of aspirin: Secondary | ICD-10-CM | POA: Diagnosis not present

## 2017-02-23 DIAGNOSIS — R2689 Other abnormalities of gait and mobility: Secondary | ICD-10-CM | POA: Diagnosis not present

## 2017-02-23 DIAGNOSIS — Z7902 Long term (current) use of antithrombotics/antiplatelets: Secondary | ICD-10-CM | POA: Diagnosis not present

## 2017-02-23 DIAGNOSIS — H5122 Internuclear ophthalmoplegia, left eye: Secondary | ICD-10-CM | POA: Diagnosis not present

## 2017-02-23 DIAGNOSIS — Z794 Long term (current) use of insulin: Secondary | ICD-10-CM | POA: Diagnosis not present

## 2017-02-23 DIAGNOSIS — I129 Hypertensive chronic kidney disease with stage 1 through stage 4 chronic kidney disease, or unspecified chronic kidney disease: Secondary | ICD-10-CM | POA: Diagnosis not present

## 2017-02-23 DIAGNOSIS — E1122 Type 2 diabetes mellitus with diabetic chronic kidney disease: Secondary | ICD-10-CM | POA: Diagnosis not present

## 2017-02-23 DIAGNOSIS — I69398 Other sequelae of cerebral infarction: Secondary | ICD-10-CM | POA: Diagnosis not present

## 2017-02-23 DIAGNOSIS — E785 Hyperlipidemia, unspecified: Secondary | ICD-10-CM | POA: Diagnosis not present

## 2017-02-23 DIAGNOSIS — H8110 Benign paroxysmal vertigo, unspecified ear: Secondary | ICD-10-CM | POA: Diagnosis not present

## 2017-02-23 DIAGNOSIS — E1169 Type 2 diabetes mellitus with other specified complication: Secondary | ICD-10-CM | POA: Diagnosis not present

## 2017-02-28 DIAGNOSIS — E1122 Type 2 diabetes mellitus with diabetic chronic kidney disease: Secondary | ICD-10-CM | POA: Diagnosis not present

## 2017-02-28 DIAGNOSIS — H8110 Benign paroxysmal vertigo, unspecified ear: Secondary | ICD-10-CM | POA: Diagnosis not present

## 2017-02-28 DIAGNOSIS — Z7982 Long term (current) use of aspirin: Secondary | ICD-10-CM | POA: Diagnosis not present

## 2017-02-28 DIAGNOSIS — I69398 Other sequelae of cerebral infarction: Secondary | ICD-10-CM | POA: Diagnosis not present

## 2017-02-28 DIAGNOSIS — Z7902 Long term (current) use of antithrombotics/antiplatelets: Secondary | ICD-10-CM | POA: Diagnosis not present

## 2017-02-28 DIAGNOSIS — H5122 Internuclear ophthalmoplegia, left eye: Secondary | ICD-10-CM | POA: Diagnosis not present

## 2017-02-28 DIAGNOSIS — R2689 Other abnormalities of gait and mobility: Secondary | ICD-10-CM | POA: Diagnosis not present

## 2017-02-28 DIAGNOSIS — E785 Hyperlipidemia, unspecified: Secondary | ICD-10-CM | POA: Diagnosis not present

## 2017-02-28 DIAGNOSIS — I129 Hypertensive chronic kidney disease with stage 1 through stage 4 chronic kidney disease, or unspecified chronic kidney disease: Secondary | ICD-10-CM | POA: Diagnosis not present

## 2017-02-28 DIAGNOSIS — E1151 Type 2 diabetes mellitus with diabetic peripheral angiopathy without gangrene: Secondary | ICD-10-CM | POA: Diagnosis not present

## 2017-02-28 DIAGNOSIS — N182 Chronic kidney disease, stage 2 (mild): Secondary | ICD-10-CM | POA: Diagnosis not present

## 2017-02-28 DIAGNOSIS — Z794 Long term (current) use of insulin: Secondary | ICD-10-CM | POA: Diagnosis not present

## 2017-02-28 DIAGNOSIS — E1169 Type 2 diabetes mellitus with other specified complication: Secondary | ICD-10-CM | POA: Diagnosis not present

## 2017-03-01 ENCOUNTER — Other Ambulatory Visit: Payer: Self-pay | Admitting: *Deleted

## 2017-03-01 DIAGNOSIS — Z7902 Long term (current) use of antithrombotics/antiplatelets: Secondary | ICD-10-CM | POA: Diagnosis not present

## 2017-03-01 DIAGNOSIS — N183 Chronic kidney disease, stage 3 (moderate): Principal | ICD-10-CM

## 2017-03-01 DIAGNOSIS — E785 Hyperlipidemia, unspecified: Secondary | ICD-10-CM | POA: Diagnosis not present

## 2017-03-01 DIAGNOSIS — E1122 Type 2 diabetes mellitus with diabetic chronic kidney disease: Secondary | ICD-10-CM | POA: Diagnosis not present

## 2017-03-01 DIAGNOSIS — I69398 Other sequelae of cerebral infarction: Secondary | ICD-10-CM | POA: Diagnosis not present

## 2017-03-01 DIAGNOSIS — E1169 Type 2 diabetes mellitus with other specified complication: Secondary | ICD-10-CM | POA: Diagnosis not present

## 2017-03-01 DIAGNOSIS — E1151 Type 2 diabetes mellitus with diabetic peripheral angiopathy without gangrene: Secondary | ICD-10-CM | POA: Diagnosis not present

## 2017-03-01 DIAGNOSIS — N182 Chronic kidney disease, stage 2 (mild): Secondary | ICD-10-CM | POA: Diagnosis not present

## 2017-03-01 DIAGNOSIS — Z7982 Long term (current) use of aspirin: Secondary | ICD-10-CM | POA: Diagnosis not present

## 2017-03-01 DIAGNOSIS — I129 Hypertensive chronic kidney disease with stage 1 through stage 4 chronic kidney disease, or unspecified chronic kidney disease: Secondary | ICD-10-CM | POA: Diagnosis not present

## 2017-03-01 DIAGNOSIS — H8110 Benign paroxysmal vertigo, unspecified ear: Secondary | ICD-10-CM | POA: Diagnosis not present

## 2017-03-01 DIAGNOSIS — Z794 Long term (current) use of insulin: Secondary | ICD-10-CM | POA: Diagnosis not present

## 2017-03-01 DIAGNOSIS — E1165 Type 2 diabetes mellitus with hyperglycemia: Principal | ICD-10-CM

## 2017-03-01 DIAGNOSIS — R2689 Other abnormalities of gait and mobility: Secondary | ICD-10-CM | POA: Diagnosis not present

## 2017-03-01 DIAGNOSIS — IMO0002 Reserved for concepts with insufficient information to code with codable children: Secondary | ICD-10-CM

## 2017-03-01 DIAGNOSIS — H5122 Internuclear ophthalmoplegia, left eye: Secondary | ICD-10-CM | POA: Diagnosis not present

## 2017-03-01 MED ORDER — GLUCOSE BLOOD VI STRP: ORAL_STRIP | 12 refills | Status: DC

## 2017-03-01 NOTE — Telephone Encounter (Signed)
Patient walked into office requesting Refill on test strips. Faxed.

## 2017-03-02 DIAGNOSIS — E1122 Type 2 diabetes mellitus with diabetic chronic kidney disease: Secondary | ICD-10-CM | POA: Diagnosis not present

## 2017-03-02 DIAGNOSIS — Z7902 Long term (current) use of antithrombotics/antiplatelets: Secondary | ICD-10-CM | POA: Diagnosis not present

## 2017-03-02 DIAGNOSIS — H8110 Benign paroxysmal vertigo, unspecified ear: Secondary | ICD-10-CM | POA: Diagnosis not present

## 2017-03-02 DIAGNOSIS — H5122 Internuclear ophthalmoplegia, left eye: Secondary | ICD-10-CM | POA: Diagnosis not present

## 2017-03-02 DIAGNOSIS — Z794 Long term (current) use of insulin: Secondary | ICD-10-CM | POA: Diagnosis not present

## 2017-03-02 DIAGNOSIS — I69398 Other sequelae of cerebral infarction: Secondary | ICD-10-CM | POA: Diagnosis not present

## 2017-03-02 DIAGNOSIS — R2689 Other abnormalities of gait and mobility: Secondary | ICD-10-CM | POA: Diagnosis not present

## 2017-03-02 DIAGNOSIS — I129 Hypertensive chronic kidney disease with stage 1 through stage 4 chronic kidney disease, or unspecified chronic kidney disease: Secondary | ICD-10-CM | POA: Diagnosis not present

## 2017-03-02 DIAGNOSIS — E1169 Type 2 diabetes mellitus with other specified complication: Secondary | ICD-10-CM | POA: Diagnosis not present

## 2017-03-02 DIAGNOSIS — E1151 Type 2 diabetes mellitus with diabetic peripheral angiopathy without gangrene: Secondary | ICD-10-CM | POA: Diagnosis not present

## 2017-03-02 DIAGNOSIS — Z7982 Long term (current) use of aspirin: Secondary | ICD-10-CM | POA: Diagnosis not present

## 2017-03-02 DIAGNOSIS — E785 Hyperlipidemia, unspecified: Secondary | ICD-10-CM | POA: Diagnosis not present

## 2017-03-02 DIAGNOSIS — N182 Chronic kidney disease, stage 2 (mild): Secondary | ICD-10-CM | POA: Diagnosis not present

## 2017-03-03 ENCOUNTER — Other Ambulatory Visit: Payer: Self-pay | Admitting: *Deleted

## 2017-03-03 DIAGNOSIS — E1169 Type 2 diabetes mellitus with other specified complication: Secondary | ICD-10-CM | POA: Diagnosis not present

## 2017-03-03 DIAGNOSIS — I129 Hypertensive chronic kidney disease with stage 1 through stage 4 chronic kidney disease, or unspecified chronic kidney disease: Secondary | ICD-10-CM | POA: Diagnosis not present

## 2017-03-03 DIAGNOSIS — H5122 Internuclear ophthalmoplegia, left eye: Secondary | ICD-10-CM | POA: Diagnosis not present

## 2017-03-03 DIAGNOSIS — E1122 Type 2 diabetes mellitus with diabetic chronic kidney disease: Secondary | ICD-10-CM | POA: Diagnosis not present

## 2017-03-03 DIAGNOSIS — Z7902 Long term (current) use of antithrombotics/antiplatelets: Secondary | ICD-10-CM | POA: Diagnosis not present

## 2017-03-03 DIAGNOSIS — N182 Chronic kidney disease, stage 2 (mild): Secondary | ICD-10-CM | POA: Diagnosis not present

## 2017-03-03 DIAGNOSIS — R2689 Other abnormalities of gait and mobility: Secondary | ICD-10-CM | POA: Diagnosis not present

## 2017-03-03 DIAGNOSIS — Z794 Long term (current) use of insulin: Secondary | ICD-10-CM | POA: Diagnosis not present

## 2017-03-03 DIAGNOSIS — E1151 Type 2 diabetes mellitus with diabetic peripheral angiopathy without gangrene: Secondary | ICD-10-CM | POA: Diagnosis not present

## 2017-03-03 DIAGNOSIS — Z7982 Long term (current) use of aspirin: Secondary | ICD-10-CM | POA: Diagnosis not present

## 2017-03-03 DIAGNOSIS — E785 Hyperlipidemia, unspecified: Secondary | ICD-10-CM | POA: Diagnosis not present

## 2017-03-03 DIAGNOSIS — I69398 Other sequelae of cerebral infarction: Secondary | ICD-10-CM | POA: Diagnosis not present

## 2017-03-03 DIAGNOSIS — H8110 Benign paroxysmal vertigo, unspecified ear: Secondary | ICD-10-CM | POA: Diagnosis not present

## 2017-03-03 NOTE — Patient Outreach (Signed)
[place] [facility]) Care Management  03/03/2017  VIVEKA WILMETH [birth year] [id number]  EMMI-Stroke referral via red dasboard for lost interest in things they use to enjoy:  Telephone call to patient; who advised that she was currently receiving home occupational therapy session & would not be able to take call at this time.   Plan: Follow up.  Sherrin Daisy, RN BSN [place] Management Coordinator [name] Care Management  [phone]

## 2017-03-04 ENCOUNTER — Other Ambulatory Visit: Payer: Self-pay | Admitting: *Deleted

## 2017-03-04 NOTE — Patient Outreach (Signed)
[place] [name]) Care Management  03/04/2017  Monique Cabrera 05-07-1935 [id number]  EMMI-Stroke referral:  Telephone call to patient who was advised of reason for call.  HIPPA verification received from patient.  Patient voices that she was recently hospitalized 2/22-2/24/2018 with symptoms of dizziness & being off balance.. States she was diagnosed with stroke.  States currently no further symptoms of dizziness. States had some symptoms of anxiety & not being able to sleep. Patient is fearful of having another stroke & currently had been advised to rest & take it easy and cannot do her housekeeping & get about like she is accustomed to doing.  Voices she lives alone but has access to calling a friend if needs to talk. Patient voices that she has home physical & occupational therapy services 4 times weekly since hospital discharge. Walking without assistance & had not had dizziness since hospital stay.  Patient was advised of importance of rest & repair of the body.  Advised that she needs to follow guidelines given to her by her doctors and therapists and gradually advance to activity that she is able to do safely. Patient voices understanding.  States she is sleeping better and understands she can talk to her doctor about her episodes of anxiety.   Patient states she is aware of other stroke symptoms besides dizziness & being off balance & knows to call 911. States she will be receiving her Medic Alert via insurance benefit. States she has Stroke Booklet and has been reading it. Voices that she has all of medications & takes them as prescribed by her doctor. States she has follow up appointment with primary care March 15th & has arranged transportation to visit through her insurance transportation benefit. Voices she also has scheduled appointment with neurologist.  Patient voices no further concerns & states does not need any further educational materials on stroke.  EMMI-Stroke call  completed.  Plan: Send case to care management assistant for closure.   Sherrin Daisy, RN BSN [place] Management Coordinator [facility] Care Management  [phone]

## 2017-03-06 ENCOUNTER — Other Ambulatory Visit: Payer: Self-pay | Admitting: Internal Medicine

## 2017-03-07 ENCOUNTER — Encounter: Payer: Medicare Other | Admitting: Internal Medicine

## 2017-03-07 DIAGNOSIS — H8110 Benign paroxysmal vertigo, unspecified ear: Secondary | ICD-10-CM | POA: Diagnosis not present

## 2017-03-07 DIAGNOSIS — N182 Chronic kidney disease, stage 2 (mild): Secondary | ICD-10-CM | POA: Diagnosis not present

## 2017-03-07 DIAGNOSIS — Z7902 Long term (current) use of antithrombotics/antiplatelets: Secondary | ICD-10-CM | POA: Diagnosis not present

## 2017-03-07 DIAGNOSIS — H5122 Internuclear ophthalmoplegia, left eye: Secondary | ICD-10-CM | POA: Diagnosis not present

## 2017-03-07 DIAGNOSIS — E785 Hyperlipidemia, unspecified: Secondary | ICD-10-CM | POA: Diagnosis not present

## 2017-03-07 DIAGNOSIS — E1151 Type 2 diabetes mellitus with diabetic peripheral angiopathy without gangrene: Secondary | ICD-10-CM | POA: Diagnosis not present

## 2017-03-07 DIAGNOSIS — R2689 Other abnormalities of gait and mobility: Secondary | ICD-10-CM | POA: Diagnosis not present

## 2017-03-07 DIAGNOSIS — E1169 Type 2 diabetes mellitus with other specified complication: Secondary | ICD-10-CM | POA: Diagnosis not present

## 2017-03-07 DIAGNOSIS — I69398 Other sequelae of cerebral infarction: Secondary | ICD-10-CM | POA: Diagnosis not present

## 2017-03-07 DIAGNOSIS — Z7982 Long term (current) use of aspirin: Secondary | ICD-10-CM | POA: Diagnosis not present

## 2017-03-07 DIAGNOSIS — Z794 Long term (current) use of insulin: Secondary | ICD-10-CM | POA: Diagnosis not present

## 2017-03-07 DIAGNOSIS — E1122 Type 2 diabetes mellitus with diabetic chronic kidney disease: Secondary | ICD-10-CM | POA: Diagnosis not present

## 2017-03-07 DIAGNOSIS — I129 Hypertensive chronic kidney disease with stage 1 through stage 4 chronic kidney disease, or unspecified chronic kidney disease: Secondary | ICD-10-CM | POA: Diagnosis not present

## 2017-03-08 DIAGNOSIS — E1151 Type 2 diabetes mellitus with diabetic peripheral angiopathy without gangrene: Secondary | ICD-10-CM | POA: Diagnosis not present

## 2017-03-08 DIAGNOSIS — R2689 Other abnormalities of gait and mobility: Secondary | ICD-10-CM | POA: Diagnosis not present

## 2017-03-08 DIAGNOSIS — I69398 Other sequelae of cerebral infarction: Secondary | ICD-10-CM | POA: Diagnosis not present

## 2017-03-08 DIAGNOSIS — Z7982 Long term (current) use of aspirin: Secondary | ICD-10-CM | POA: Diagnosis not present

## 2017-03-08 DIAGNOSIS — Z794 Long term (current) use of insulin: Secondary | ICD-10-CM | POA: Diagnosis not present

## 2017-03-08 DIAGNOSIS — E785 Hyperlipidemia, unspecified: Secondary | ICD-10-CM | POA: Diagnosis not present

## 2017-03-08 DIAGNOSIS — Z7902 Long term (current) use of antithrombotics/antiplatelets: Secondary | ICD-10-CM | POA: Diagnosis not present

## 2017-03-08 DIAGNOSIS — H8110 Benign paroxysmal vertigo, unspecified ear: Secondary | ICD-10-CM | POA: Diagnosis not present

## 2017-03-08 DIAGNOSIS — I129 Hypertensive chronic kidney disease with stage 1 through stage 4 chronic kidney disease, or unspecified chronic kidney disease: Secondary | ICD-10-CM | POA: Diagnosis not present

## 2017-03-08 DIAGNOSIS — N182 Chronic kidney disease, stage 2 (mild): Secondary | ICD-10-CM | POA: Diagnosis not present

## 2017-03-08 DIAGNOSIS — E1169 Type 2 diabetes mellitus with other specified complication: Secondary | ICD-10-CM | POA: Diagnosis not present

## 2017-03-08 DIAGNOSIS — H5122 Internuclear ophthalmoplegia, left eye: Secondary | ICD-10-CM | POA: Diagnosis not present

## 2017-03-08 DIAGNOSIS — E1122 Type 2 diabetes mellitus with diabetic chronic kidney disease: Secondary | ICD-10-CM | POA: Diagnosis not present

## 2017-03-09 DIAGNOSIS — E785 Hyperlipidemia, unspecified: Secondary | ICD-10-CM | POA: Diagnosis not present

## 2017-03-09 DIAGNOSIS — R2689 Other abnormalities of gait and mobility: Secondary | ICD-10-CM | POA: Diagnosis not present

## 2017-03-09 DIAGNOSIS — N182 Chronic kidney disease, stage 2 (mild): Secondary | ICD-10-CM | POA: Diagnosis not present

## 2017-03-09 DIAGNOSIS — I129 Hypertensive chronic kidney disease with stage 1 through stage 4 chronic kidney disease, or unspecified chronic kidney disease: Secondary | ICD-10-CM | POA: Diagnosis not present

## 2017-03-09 DIAGNOSIS — Z7982 Long term (current) use of aspirin: Secondary | ICD-10-CM | POA: Diagnosis not present

## 2017-03-09 DIAGNOSIS — I69398 Other sequelae of cerebral infarction: Secondary | ICD-10-CM | POA: Diagnosis not present

## 2017-03-09 DIAGNOSIS — Z794 Long term (current) use of insulin: Secondary | ICD-10-CM | POA: Diagnosis not present

## 2017-03-09 DIAGNOSIS — E1122 Type 2 diabetes mellitus with diabetic chronic kidney disease: Secondary | ICD-10-CM | POA: Diagnosis not present

## 2017-03-09 DIAGNOSIS — Z7902 Long term (current) use of antithrombotics/antiplatelets: Secondary | ICD-10-CM | POA: Diagnosis not present

## 2017-03-09 DIAGNOSIS — E1151 Type 2 diabetes mellitus with diabetic peripheral angiopathy without gangrene: Secondary | ICD-10-CM | POA: Diagnosis not present

## 2017-03-09 DIAGNOSIS — H8110 Benign paroxysmal vertigo, unspecified ear: Secondary | ICD-10-CM | POA: Diagnosis not present

## 2017-03-09 DIAGNOSIS — E1169 Type 2 diabetes mellitus with other specified complication: Secondary | ICD-10-CM | POA: Diagnosis not present

## 2017-03-09 DIAGNOSIS — H5122 Internuclear ophthalmoplegia, left eye: Secondary | ICD-10-CM | POA: Diagnosis not present

## 2017-03-10 ENCOUNTER — Encounter: Payer: Self-pay | Admitting: Internal Medicine

## 2017-03-10 ENCOUNTER — Ambulatory Visit (INDEPENDENT_AMBULATORY_CARE_PROVIDER_SITE_OTHER): Payer: Medicare Other | Admitting: Internal Medicine

## 2017-03-10 VITALS — BP 128/70 | HR 60 | Temp 98.1°F | Wt 147.0 lb

## 2017-03-10 DIAGNOSIS — N183 Chronic kidney disease, stage 3 unspecified: Secondary | ICD-10-CM

## 2017-03-10 DIAGNOSIS — Z794 Long term (current) use of insulin: Secondary | ICD-10-CM

## 2017-03-10 DIAGNOSIS — I10 Essential (primary) hypertension: Secondary | ICD-10-CM | POA: Diagnosis not present

## 2017-03-10 DIAGNOSIS — E11319 Type 2 diabetes mellitus with unspecified diabetic retinopathy without macular edema: Secondary | ICD-10-CM

## 2017-03-10 DIAGNOSIS — H8149 Vertigo of central origin, unspecified ear: Secondary | ICD-10-CM

## 2017-03-10 DIAGNOSIS — I639 Cerebral infarction, unspecified: Secondary | ICD-10-CM

## 2017-03-10 DIAGNOSIS — E1122 Type 2 diabetes mellitus with diabetic chronic kidney disease: Secondary | ICD-10-CM

## 2017-03-10 DIAGNOSIS — IMO0002 Reserved for concepts with insufficient information to code with codable children: Secondary | ICD-10-CM

## 2017-03-10 DIAGNOSIS — E1165 Type 2 diabetes mellitus with hyperglycemia: Secondary | ICD-10-CM

## 2017-03-10 DIAGNOSIS — H814 Vertigo of central origin: Secondary | ICD-10-CM

## 2017-03-10 NOTE — Progress Notes (Signed)
Location:  [facility] clinic Provider:  Aurilla Coulibaly L. Mariea Clonts, D.O., C.M.D.  Code Status: full code Goals of Care:  Advanced Directives 02/17/2017  Does Patient Have a Medical Advance Directive? No  Would patient like information on creating a medical advance directive? -  Pre-existing out of facility DNR order (yellow form or pink MOST form) -   Chief Complaint  Patient presents with  . Medical Management of Chronic Issues    15mth follow-up, hospital follow-up    HPI: Patient is a 82 y.o. female seen today for medical management of chronic diseases.    Since her stroke, her nerves are torn up.  Has trouble sleeping.  She continuously thinks if anything hurts that she's having another stroke.  Gets up and stands at front door to get fresh air or walks around the block.   She thought she had a low sugar with the fuzzy feeling in her head.  She took it and it was alright.  She thought it would go away (on a wed night). Had juice.  Went to bed, but head felt even fuzzier Thursday morning.  Asked grandson to take her to the hospital.  CT on 2/22 showed only atrophy and chronic white matter disease.  MRI showed punctate infarct on the floor of the upper 4th ventricle, mild for age chronic microvascular ischemia and atrophy.  She is using the scopolamine patch and changing every three days.  She also was started on full dose asa and enalapril was increase to 40mg  daily.  Metformin was stopped as well as baby asa and naproxen.    Using cane for balance now. Getting home therapy and they've been coming out and helping her.  They finish today.  Admits balance is poor.    BP controlled today at 128/70.    hba1c day of admission was good for her at 8.4 on 2/22 but metformin was stopped due to renal dysfunction.    Says eyesight is much worse since stroke.  Still has left ear and side of face pain every now and then, but not persistent.  No weakness of extremities.  Balance still an issue.    She says she is  eating regularly and taking her insulin.  Says it was a wake up call with the stroke.  Claims to be doing what she's supposed to.  Has had several episodes in the past of hypoglycemia that did not teach her this.    Needs me to request a medical alert button through [employer].  Past Medical History:  Diagnosis Date  . Acute upper respiratory infections of unspecified site   . Anemia   . Anemia, unspecified   . Atherosclerosis of native arteries of the extremities, unspecified   . Chest pain, unspecified   . Chronic kidney disease (CKD), stage II (mild)   . Diabetes mellitus   . Diarrhea   . Disorder of bone and cartilage, unspecified   . DM (diabetes mellitus) type II controlled with renal manifestation ([place])   . Herpes zoster with other nervous system complications(053.19)   . Hypercalcemia   . Hypertension   . Hypertensive renal disease, benign   . Nonspecific reaction to tuberculin skin test without active tuberculosis(795.51)   . Nonspecific tuberculin test reaction   . Other and unspecified hyperlipidemia   . Pain in joint, lower leg   . Peripheral arterial disease ([place])   . Postherpetic neuralgia   . Proteinuria   . Type II or unspecified type diabetes mellitus  with renal manifestations, not stated as uncontrolled(250.40)   . Type II or unspecified type diabetes mellitus with renal manifestations, uncontrolled(250.42)   . Unspecified disorder of kidney and ureter   . Unspecified essential hypertension     Past Surgical History:  Procedure Laterality Date  . hysterectomy    . INCISION AND DRAINAGE Left 05/27/14   sebacous cyst, ear  . removal of cyst from hand    . removal of tumor from foot    . TONSILLECTOMY      Allergies  Allergen Reactions  . Invokana [Canagliflozin] Other (See Comments)    Vagina itching and swelling Vaginal Itching and irritation   . Jardiance [Empagliflozin] Itching and Other (See Comments)    Vaginal itching and swelling     Allergies as of 03/10/2017      Reactions   Invokana [canagliflozin] Other (See Comments)   Vagina itching and swelling Vaginal Itching and irritation    Jardiance [empagliflozin] Itching, Other (See Comments)   Vaginal itching and swelling      Medication List       Accurate as of 03/10/17  8:15 AM. Always use your most recent med list.          amLODipine 10 MG tablet Commonly known as:  NORVASC TAKE ONE TABLET BY MOUTH DAILY FOR HIGH BLOOD PRESSURE   aspirin EC 325 MG tablet Take 1 tablet (325 mg total) by mouth daily.   B-D ULTRAFINE III SHORT PEN 31G X 8 MM Misc Generic drug:  Insulin Pen Needle USE WITH LANTUS EVERY DAY FOR BLOOD SUGAR   carvedilol 25 MG tablet Commonly known as:  COREG Take one tablet by mouth twice daily with a meal   clopidogrel 75 MG tablet Commonly known as:  PLAVIX TAKE ONE TABLET BY MOUTH ONCE DAILY   enalapril 20 MG tablet Commonly known as:  VASOTEC Take 2 tablets (40 mg total) by mouth daily. Prescribed by kidney specialist Dr.Powell   glucose blood test strip Commonly known as:  ONETOUCH VERIO Use to test blood sugar three times daily. Dx: E11.9   Insulin Glargine 100 UNIT/ML Solostar Pen Commonly known as:  LANTUS SOLOSTAR Inject 50 Units into the skin daily.   insulin lispro 100 UNIT/ML KiwkPen Commonly known as:  HUMALOG KWIKPEN 8 units breakfast, 12 units lunch & supper   lovastatin 20 MG tablet Commonly known as:  MEVACOR TAKE 1 TABLET (20 MG TOTAL) BY MOUTH AT BEDTIME FOR CHOLESTEROL.   pantoprazole 40 MG tablet Commonly known as:  PROTONIX Take 1 tablet (40 mg total) by mouth daily.   scopolamine 1 MG/3DAYS Commonly known as:  TRANSDERM-SCOP Place 1 patch (1.5 mg total) onto the skin every 3 (three) days.   valsartan-hydrochlorothiazide 320-25 MG tablet Commonly known as:  DIOVAN-HCT TAKE ONE TABLET BY MOUTH ONCE DAILY       Review of Systems:  Review of Systems  Constitutional: Negative for chills,  fever and malaise/fatigue.  HENT: Negative for congestion and hearing loss.        Left ear and side of face pain  Eyes: Positive for blurred vision.       Left eye blurrier since stroke, if covers it, she's ok  Respiratory: Negative for cough and shortness of breath.   Cardiovascular: Negative for chest pain, palpitations and leg swelling.  Gastrointestinal: Negative for abdominal pain, blood in stool, constipation and melena.  Genitourinary: Negative for dysuria.  Musculoskeletal: Negative for falls and myalgias.  Skin: Negative for itching and  rash.  Neurological: Negative for dizziness, loss of consciousness and weakness.       Balance poor, using cane s/p stroke  Endo/Heme/Allergies: Does not bruise/bleed easily.  Psychiatric/Behavioral: Positive for memory loss. Negative for depression. The patient is nervous/anxious. The patient does not have insomnia.     Health Maintenance  Topic Date Due  . OPHTHALMOLOGY EXAM  08/03/2016  . FOOT EXAM  06/11/2017  . HEMOGLOBIN A1C  08/17/2017  . INFLUENZA VACCINE  Completed  . DEXA SCAN  Completed  . PNA vac Low Risk Adult  Completed    Physical Exam: Vitals:   03/10/17 0757  BP: 128/70  Pulse: 60  Temp: 98.1 F (36.7 C)  TempSrc: Oral  SpO2: 99%  Weight: 147 lb (66.7 kg)   Body mass index is 24.46 kg/m. Physical Exam  Constitutional: She is oriented to person, place, and time. She appears well-developed and well-nourished. No distress.  HENT:  Head: Normocephalic and atraumatic.  Eyes: EOM are normal. Pupils are equal, round, and reactive to light.  Left vision blurry  Cardiovascular: Normal rate, regular rhythm and intact distal pulses.   Murmur heard. Pulmonary/Chest: Effort normal and breath sounds normal.  Abdominal: Bowel sounds are normal.  Musculoskeletal: Normal range of motion.  Neurological: She is alert and oriented to person, place, and time.  Some short term memory loss; balance poor  Skin: Skin is warm and  dry. Capillary refill takes less than 2 seconds.  Psychiatric: She has a normal mood and affect.  A bit more anxious than usual     Labs reviewed: Basic Metabolic Panel:  Recent Labs  01/06/17 0927 02/17/17 0915 02/19/17 0349  NA 138 136 136  K 4.2 3.5 3.5  CL 106 103 103  CO2 24 22 23   GLUCOSE 148* 100* 149*  BUN 28* 26* 30*  CREATININE 1.49* 1.44* 1.45*  CALCIUM 9.8 10.1 9.9   Liver Function Tests:  Recent Labs  10/07/16 0949 01/06/17 0927 02/19/17 0349  AST 13 14 15   ALT 8 7 10*  ALKPHOS 73 68 53  BILITOT 0.4 0.3 0.5  PROT 6.5 6.6 6.3*  ALBUMIN 3.8 3.7 3.3*   No results for input(s): LIPASE, AMYLASE in the last 8760 hours. No results for input(s): AMMONIA in the last 8760 hours. CBC:  Recent Labs  02/17/17 0915 02/18/17 1230 02/19/17 0349  WBC 8.1  --  6.2  NEUTROABS 5.3  --   --   HGB 11.7*  --  10.9*  HCT 34.1* 35.8 32.3*  MCV 89.0  --  88.5  PLT 222  --  228   Lipid Panel:  Recent Labs  02/17/17 2355  CHOL 187  HDL 61  LDLCALC 102*  TRIG 121  CHOLHDL 3.1   Lab Results  Component Value Date   HGBA1C 8.4 (H) 02/17/2017    Procedures since last visit: Ct Angio Head W Or Wo Contrast  Result Date: 02/19/2017 CLINICAL DATA:  Recent brainstem stroke and vertigo. Hypertension and dizziness. EXAM: CT ANGIOGRAPHY HEAD AND NECK TECHNIQUE: Multidetector CT imaging of the head and neck was performed using the standard protocol during bolus administration of intravenous contrast. Multiplanar CT image reconstructions and MIPs were obtained to evaluate the vascular anatomy. Carotid stenosis measurements (when applicable) are obtained utilizing NASCET criteria, using the distal internal carotid diameter as the denominator. CONTRAST:  MRI of the brain 02/18/2017. COMPARISON:  MRI of the brain 02/18/2017 FINDINGS: CT HEAD FINDINGS Brain: Moderate generalized atrophy is again seen. Basal ganglia  and insular cortex are intact. The brainstem infarct is below the  resolution of CT. Mild white matter changes are evident. The ventricles are of normal size. No significant extra-axial fluid collection is present. Vascular: Dense atherosclerotic calcifications are present within the cavernous internal carotid artery is. These extend into the M1 segments bilaterally. There is no hyperdense vessel. Skull: The calvarium is within normal limits. No focal lytic or blastic lesions are present. Sinuses: An anterior right ethmoid air cell is opacified. Mild mucosal thickening is present in the ethmoid air cells bilaterally. There is mild mucosal thickening in the right maxillary sinus as well. No fluid levels are present. The paranasal sinuses and mastoid air cells are otherwise clear. Orbits: Bilateral lens replacements are present. The globes and orbits are otherwise within normal limits. Review of the MIP images confirms the above findings CTA NECK FINDINGS Aortic arch: A 3 vessel arch configuration is present. Atherosclerotic calcifications are present at the arch without aneurysm or focal stenosis of the great vessels. Right carotid system: Right common carotid artery is tortuous without significant stenosis. Mild atherosclerotic changes are present at the right carotid bifurcation. There is no significant stenosis. The cervical right ICA demonstrates mild tortuosity but is otherwise normal. Left carotid system: The left common carotid artery is mildly tortuous. Atherosclerotic calcifications are present at the carotid bifurcation without a significant stenosis. There is mild a moderate tortuosity of the cervical left ICA is well without significant stenosis. Vertebral arteries: The vertebral arteries originate from the subclavian arteries bilaterally. The left vertebral artery is the dominant vessel. Moderate tortuosity is present in the right vertebral artery with focal kinking at the C4-5 foramen. There is no focal stenosis. Skeleton: Chronic endplate changes and loss of disc  height are present at C5-6 and C6-7. Facet spurring is greatest at C4-5 with grade 1 anterolisthesis. Osseous foraminal narrowing is present on the right at C4-5 and C5-6 in particular. No focal lytic or blastic lesions are present. The patient is edentulous. Other neck: A dominant nodule within the left lobe of the thyroid measures 18 x 12 x 12 mm. No significant cervical adenopathy is present. The salivary glands are within normal limits. Upper chest: Centrilobular emphysema is evident. An 8 mm spiculated nodule is present in the left upper lobe. No other focal nodule or mass lesion is present. No significant mediastinal adenopathy is present. Review of the MIP images confirms the above findings CTA HEAD FINDINGS Anterior circulation: Dense atherosclerotic calcifications are present within the cavernous internal carotid arteries bilaterally without a significant stenosis through the ICA termini. Atherosclerotic irregularity is present within the M1 segments, left greater than right. A 50% stenosis is present in the mid left M1 segment. Anterior communicating artery is patent. MCA bifurcations are intact bilaterally. There is a high-grade stenosis in the proximal left anterior left M2 segment. There is moderate attenuation of distal ACA branch vessels bilaterally without a significant proximal stenosis. Distal MCA narrowing is present bilaterally as well. Posterior circulation: Left vertebral artery is the dominant vessel. PICA origins are visualized and normal. There is mild atherosclerotic change in the basilar artery without a significant stenosis relative to the more distal vessel. Both posterior cerebral arteries originate from the basilar tip. There is moderate proximal P2 segment stenosis bilaterally. There is significant attenuation of distal PCA branch vessels bilaterally. Venous sinuses: The dural sinuses are patent. The right transverse sinus is dominant. The straight sinus and deep cerebral veins are  intact. The cortical veins are unremarkable. Anatomic  variants: None Delayed phase: The postcontrast images demonstrate no pathologic enhancement. Review of the MIP images confirms the above findings IMPRESSION: 1. Atherosclerotic calcifications at the aortic arch in both carotid bifurcations without significant stenosis. 2. Dense atherosclerotic calcifications within the cavernous internal carotid artery bilaterally without a significant stenosis relative to the ICA terminus. 3. Atherosclerotic changes in the M1 segments bilaterally with approximately 50% narrowing on the left. 4. High-grade stenosis of the left anterior M2 segment. 5. Moderate diffuse distal small vessel disease. 6. Moderate stenoses of proximal P2 segments bilaterally. 7. Atherosclerotic irregularity of the basilar artery without a focal stenosis relative to the more distal vessel. 8. Multilevel spondylosis of the cervical spine. 9. Moderate generalized atrophy. 10. The brainstem infarct is below the resolution of this study. 11. Dominant left thyroid nodule. Recommend further evaluation with thyroid ultrasound. If patient is clinically hyperthyroid, consider nuclear medicine thyroid uptake and scan. Imaging findings of potential clinical significance: Centrilobular emphysema, aortic atherosclerosis Electronically Signed   By: San Morelle M.D.   On: 02/19/2017 07:22   Ct Head Wo Contrast  Result Date: 02/17/2017 CLINICAL DATA:  Dizziness starting last night EXAM: CT HEAD WITHOUT CONTRAST TECHNIQUE: Contiguous axial images were obtained from the base of the skull through the vertex without intravenous contrast. COMPARISON:  05/19/2010 FINDINGS: Brain: No intracranial hemorrhage, mass effect or midline shift. No acute cortical infarction. No mass lesion is noted on this unenhanced scan. Stable mild cerebral atrophy. Again noted mild periventricular and patchy subcortical white matter decreased attenuation consistent with chronic small  vessel ischemic changes. Ventricular size is stable from prior exam. Vascular: Atherosclerotic calcifications of carotid siphon. Skull: No skull fracture is noted.  No bony lesion. Sinuses/Orbits: Mild mucosal thickening with partial opacification right ethmoid air cells. No paranasal sinuses air-fluid levels. Other: None IMPRESSION: No acute intracranial abnormality. Stable atrophy and chronic white matter disease. Atherosclerotic calcifications of carotid siphon again noted. No definite acute cortical infarction. Mucosal thickening with partial opacification right ethmoid air cells. Electronically Signed   By: Lahoma Crocker M.D.   On: 02/17/2017 11:03   Ct Angio Neck W Or Wo Contrast  Result Date: 02/19/2017 CLINICAL DATA:  Recent brainstem stroke and vertigo. Hypertension and dizziness. EXAM: CT ANGIOGRAPHY HEAD AND NECK TECHNIQUE: Multidetector CT imaging of the head and neck was performed using the standard protocol during bolus administration of intravenous contrast. Multiplanar CT image reconstructions and MIPs were obtained to evaluate the vascular anatomy. Carotid stenosis measurements (when applicable) are obtained utilizing NASCET criteria, using the distal internal carotid diameter as the denominator. CONTRAST:  MRI of the brain 02/18/2017. COMPARISON:  MRI of the brain 02/18/2017 FINDINGS: CT HEAD FINDINGS Brain: Moderate generalized atrophy is again seen. Basal ganglia and insular cortex are intact. The brainstem infarct is below the resolution of CT. Mild white matter changes are evident. The ventricles are of normal size. No significant extra-axial fluid collection is present. Vascular: Dense atherosclerotic calcifications are present within the cavernous internal carotid artery is. These extend into the M1 segments bilaterally. There is no hyperdense vessel. Skull: The calvarium is within normal limits. No focal lytic or blastic lesions are present. Sinuses: An anterior right ethmoid air cell is  opacified. Mild mucosal thickening is present in the ethmoid air cells bilaterally. There is mild mucosal thickening in the right maxillary sinus as well. No fluid levels are present. The paranasal sinuses and mastoid air cells are otherwise clear. Orbits: Bilateral lens replacements are present. The globes and orbits are otherwise  within normal limits. Review of the MIP images confirms the above findings CTA NECK FINDINGS Aortic arch: A 3 vessel arch configuration is present. Atherosclerotic calcifications are present at the arch without aneurysm or focal stenosis of the great vessels. Right carotid system: Right common carotid artery is tortuous without significant stenosis. Mild atherosclerotic changes are present at the right carotid bifurcation. There is no significant stenosis. The cervical right ICA demonstrates mild tortuosity but is otherwise normal. Left carotid system: The left common carotid artery is mildly tortuous. Atherosclerotic calcifications are present at the carotid bifurcation without a significant stenosis. There is mild a moderate tortuosity of the cervical left ICA is well without significant stenosis. Vertebral arteries: The vertebral arteries originate from the subclavian arteries bilaterally. The left vertebral artery is the dominant vessel. Moderate tortuosity is present in the right vertebral artery with focal kinking at the C4-5 foramen. There is no focal stenosis. Skeleton: Chronic endplate changes and loss of disc height are present at C5-6 and C6-7. Facet spurring is greatest at C4-5 with grade 1 anterolisthesis. Osseous foraminal narrowing is present on the right at C4-5 and C5-6 in particular. No focal lytic or blastic lesions are present. The patient is edentulous. Other neck: A dominant nodule within the left lobe of the thyroid measures 18 x 12 x 12 mm. No significant cervical adenopathy is present. The salivary glands are within normal limits. Upper chest: Centrilobular  emphysema is evident. An 8 mm spiculated nodule is present in the left upper lobe. No other focal nodule or mass lesion is present. No significant mediastinal adenopathy is present. Review of the MIP images confirms the above findings CTA HEAD FINDINGS Anterior circulation: Dense atherosclerotic calcifications are present within the cavernous internal carotid arteries bilaterally without a significant stenosis through the ICA termini. Atherosclerotic irregularity is present within the M1 segments, left greater than right. A 50% stenosis is present in the mid left M1 segment. Anterior communicating artery is patent. MCA bifurcations are intact bilaterally. There is a high-grade stenosis in the proximal left anterior left M2 segment. There is moderate attenuation of distal ACA branch vessels bilaterally without a significant proximal stenosis. Distal MCA narrowing is present bilaterally as well. Posterior circulation: Left vertebral artery is the dominant vessel. PICA origins are visualized and normal. There is mild atherosclerotic change in the basilar artery without a significant stenosis relative to the more distal vessel. Both posterior cerebral arteries originate from the basilar tip. There is moderate proximal P2 segment stenosis bilaterally. There is significant attenuation of distal PCA branch vessels bilaterally. Venous sinuses: The dural sinuses are patent. The right transverse sinus is dominant. The straight sinus and deep cerebral veins are intact. The cortical veins are unremarkable. Anatomic variants: None Delayed phase: The postcontrast images demonstrate no pathologic enhancement. Review of the MIP images confirms the above findings IMPRESSION: 1. Atherosclerotic calcifications at the aortic arch in both carotid bifurcations without significant stenosis. 2. Dense atherosclerotic calcifications within the cavernous internal carotid artery bilaterally without a significant stenosis relative to the ICA  terminus. 3. Atherosclerotic changes in the M1 segments bilaterally with approximately 50% narrowing on the left. 4. High-grade stenosis of the left anterior M2 segment. 5. Moderate diffuse distal small vessel disease. 6. Moderate stenoses of proximal P2 segments bilaterally. 7. Atherosclerotic irregularity of the basilar artery without a focal stenosis relative to the more distal vessel. 8. Multilevel spondylosis of the cervical spine. 9. Moderate generalized atrophy. 10. The brainstem infarct is below the resolution of this study.  11. Dominant left thyroid nodule. Recommend further evaluation with thyroid ultrasound. If patient is clinically hyperthyroid, consider nuclear medicine thyroid uptake and scan. Imaging findings of potential clinical significance: Centrilobular emphysema, aortic atherosclerosis Electronically Signed   By: San Morelle M.D.   On: 02/19/2017 07:22   Mr Brain Wo Contrast  Result Date: 02/18/2017 CLINICAL DATA:  Dizziness.  Off balance. EXAM: MRI HEAD WITHOUT CONTRAST TECHNIQUE: Multiplanar, multiecho pulse sequences of the brain and surrounding structures were obtained without intravenous contrast. COMPARISON:  Head CT from yesterday FINDINGS: Brain: Punctate acute infarct left para median along the upper floor of the fourth ventricle. Mild microvascular ischemic change for age in the cerebral white matter and pons. Remote lacunar infarct in the left periatrial white matter. Mild for age generalized volume loss. No acute or chronic blood products, hydrocephalus, or mass. Vascular: Major flow voids are preserved Skull and upper cervical spine: Negative Sinuses/Orbits: Bilateral cataract resection IMPRESSION: 1. Punctate acute infarct along the floor of the upper fourth ventricle. 2. Mild for age chronic microvascular ischemia and atrophy. Electronically Signed   By: Monte Fantasia M.D.   On: 02/18/2017 07:47   Dg Chest Port 1 View  Result Date: 02/17/2017 CLINICAL DATA:   fever EXAM: PORTABLE CHEST - 1 VIEW COMPARISON:  07/30/2014 FINDINGS: Lungs are clear. Heart size and mediastinal contours are within normal limits. Atheromatous aorta. No effusion. Visualized bones unremarkable. IMPRESSION: No acute cardiopulmonary disease. Aortic Atherosclerosis (ICD10-170.0) Electronically Signed   By: Lucrezia Europe M.D.   On: 02/17/2017 20:13    Assessment/Plan 1. Acute ischemic stroke (HCC) -with interocular ophthalmoplegia and central vertigo -pt has longstanding noncompliance with diabetes mgt (insulin and po intake) with tons of issues with hypoglycemia and hba1c over 8.5 until her last check when she entered the hospital with her stroke  2. Central nervous system origin vertigo, unspecified laterality -due to stroke in 4th ventricle -cont scopolamine patch, finish PT and use cane  3. 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 Uncontrolled type 2 diabetes mellitus with stage 3 chronic kidney disease, with long-term current use of insulin (HCC) -cont insulin therapy, no lows lately, claims she is being compliant but has historically been dishonest with me about her po intake and insulin use -metformin was stopped in hospital (but hba1c was finally less than 8.5), ace was increased  5. Essential hypertension, benign -bp at goal today with increased enalapril -apparently renal does want her on arb/hctz and ace at the same time  Labs/tests ordered:  No orders of the defined types were placed in this encounter.  Next appt:  04/07/2017   Kentavious Michele L. Elice Crigger, D.O. [facility] [address], [place] [address] Cell Phone (Mon-Fri 8am-5pm):  [phone] On Call:   [phone] & follow prompts after 5pm & weekends Office Phone:  [phone] Office Fax:  [phone]

## 2017-03-11 DIAGNOSIS — E1169 Type 2 diabetes mellitus with other specified complication: Secondary | ICD-10-CM | POA: Diagnosis not present

## 2017-03-11 DIAGNOSIS — E1151 Type 2 diabetes mellitus with diabetic peripheral angiopathy without gangrene: Secondary | ICD-10-CM | POA: Diagnosis not present

## 2017-03-11 DIAGNOSIS — N182 Chronic kidney disease, stage 2 (mild): Secondary | ICD-10-CM | POA: Diagnosis not present

## 2017-03-11 DIAGNOSIS — Z7902 Long term (current) use of antithrombotics/antiplatelets: Secondary | ICD-10-CM | POA: Diagnosis not present

## 2017-03-11 DIAGNOSIS — E785 Hyperlipidemia, unspecified: Secondary | ICD-10-CM | POA: Diagnosis not present

## 2017-03-11 DIAGNOSIS — E1122 Type 2 diabetes mellitus with diabetic chronic kidney disease: Secondary | ICD-10-CM | POA: Diagnosis not present

## 2017-03-11 DIAGNOSIS — Z7982 Long term (current) use of aspirin: Secondary | ICD-10-CM | POA: Diagnosis not present

## 2017-03-11 DIAGNOSIS — H5122 Internuclear ophthalmoplegia, left eye: Secondary | ICD-10-CM | POA: Diagnosis not present

## 2017-03-11 DIAGNOSIS — I69398 Other sequelae of cerebral infarction: Secondary | ICD-10-CM | POA: Diagnosis not present

## 2017-03-11 DIAGNOSIS — Z794 Long term (current) use of insulin: Secondary | ICD-10-CM | POA: Diagnosis not present

## 2017-03-11 DIAGNOSIS — H8110 Benign paroxysmal vertigo, unspecified ear: Secondary | ICD-10-CM | POA: Diagnosis not present

## 2017-03-11 DIAGNOSIS — I129 Hypertensive chronic kidney disease with stage 1 through stage 4 chronic kidney disease, or unspecified chronic kidney disease: Secondary | ICD-10-CM | POA: Diagnosis not present

## 2017-03-11 DIAGNOSIS — R2689 Other abnormalities of gait and mobility: Secondary | ICD-10-CM | POA: Diagnosis not present

## 2017-03-12 DIAGNOSIS — E161 Other hypoglycemia: Secondary | ICD-10-CM | POA: Diagnosis not present

## 2017-03-12 DIAGNOSIS — R739 Hyperglycemia, unspecified: Secondary | ICD-10-CM | POA: Diagnosis not present

## 2017-03-14 ENCOUNTER — Telehealth: Payer: Self-pay | Admitting: *Deleted

## 2017-03-14 NOTE — Telephone Encounter (Signed)
Ok to refill 

## 2017-03-14 NOTE — Telephone Encounter (Signed)
Patient walked into office and stated that she needed a refill on her [place]. Pharmacy will not refill because it is too early. Filled on 02/19/17. Patient stated that she is short 4 patches because they came off in the shower. Wants a refill. Please Advise.

## 2017-03-15 ENCOUNTER — Other Ambulatory Visit: Payer: Self-pay | Admitting: Internal Medicine

## 2017-03-15 MED ORDER — SCOPOLAMINE 1 MG/3DAYS TD PT72: 1.0000 | MEDICATED_PATCH | TRANSDERMAL | 12 refills | Status: DC

## 2017-03-15 NOTE — Telephone Encounter (Signed)
Faxed Refill to pharmacy and patient notified and agreed.

## 2017-03-17 ENCOUNTER — Other Ambulatory Visit: Payer: Self-pay | Admitting: Internal Medicine

## 2017-03-18 ENCOUNTER — Other Ambulatory Visit: Payer: Self-pay | Admitting: Internal Medicine

## 2017-03-22 ENCOUNTER — Other Ambulatory Visit: Payer: Self-pay

## 2017-03-22 NOTE — Patient Outreach (Signed)
I called [facility] to clarify why Monique Cabrera is on both an ARB and ACE inhibitor.  I spoke with Georgie Chard, LPN and she is going to send a message to Dr. Florene Glen. She will call me back once she hears back from Dr. Florene Glen.  I will follow up in a week if I do not hear back from her.   Deanne Coffer, PharmD, [employer] [phone]

## 2017-03-25 ENCOUNTER — Other Ambulatory Visit: Payer: Self-pay

## 2017-03-25 NOTE — Patient Outreach (Signed)
I received a call from Georgie Chard, LPN from Dr. Abel Presto office in regards to the message I left regarding the enalapril and valsartan-HCTZ duplication of therapy question.  I had asked if he wanted Ms. Bollig to be on both medications.  He stated he did not prescribe the valsartan-HCTZ.  I stated that the primary care provider thought he wanted Ms. Ernest to be on both the ACE inhibitor and the ARB.  I wanted to make sure he wanted her to be on both even though I could not find an indication for her to be on both.  Combination therapy leads to increased adverse effects and no more benefit than individual treatment (ONTARGET trial).  He stated he was fine with being on just one of them.  I will send a message to the primary care provider to see if she is willing to stop either the enalapril or valsartan-HCTZ.    Deanne Coffer, PharmD, [employer] [phone]

## 2017-03-29 ENCOUNTER — Ambulatory Visit: Payer: Self-pay

## 2017-03-31 ENCOUNTER — Other Ambulatory Visit: Payer: Self-pay | Admitting: Internal Medicine

## 2017-03-31 DIAGNOSIS — E1169 Type 2 diabetes mellitus with other specified complication: Secondary | ICD-10-CM

## 2017-03-31 DIAGNOSIS — E785 Hyperlipidemia, unspecified: Principal | ICD-10-CM

## 2017-04-07 ENCOUNTER — Other Ambulatory Visit: Payer: Medicare Other

## 2017-04-07 DIAGNOSIS — E1165 Type 2 diabetes mellitus with hyperglycemia: Principal | ICD-10-CM

## 2017-04-07 DIAGNOSIS — IMO0002 Reserved for concepts with insufficient information to code with codable children: Secondary | ICD-10-CM

## 2017-04-07 DIAGNOSIS — N183 Chronic kidney disease, stage 3 (moderate): Principal | ICD-10-CM

## 2017-04-07 DIAGNOSIS — Z794 Long term (current) use of insulin: Principal | ICD-10-CM

## 2017-04-07 DIAGNOSIS — E1122 Type 2 diabetes mellitus with diabetic chronic kidney disease: Secondary | ICD-10-CM

## 2017-04-07 LAB — COMPLETE METABOLIC PANEL WITH GFR
ALT: 17 U/L (ref 6–29)
AST: 17 U/L (ref 10–35)
Albumin: 3.8 g/dL (ref 3.6–5.1)
Alkaline Phosphatase: 57 U/L (ref 33–130)
BUN: 25 mg/dL (ref 7–25)
CO2: 24 mmol/L (ref 20–31)
Calcium: 10 mg/dL (ref 8.6–10.4)
Chloride: 104 mmol/L (ref 98–110)
Creat: 1.52 mg/dL — ABNORMAL HIGH (ref 0.60–0.93)
GFR, Est African American: 37 mL/min — ABNORMAL LOW (ref >=60)
GFR, Est Non African American: 32 mL/min — ABNORMAL LOW (ref >=60)
Glucose, Bld: 102 mg/dL — ABNORMAL HIGH (ref 65–99)
Potassium: 4 mmol/L (ref 3.5–5.3)
Sodium: 136 mmol/L (ref 135–146)
Total Bilirubin: 0.4 mg/dL (ref 0.2–1.2)
Total Protein: 6.7 g/dL (ref 6.1–8.1)

## 2017-04-08 LAB — MICROALBUMIN / CREATININE URINE RATIO
Creatinine, Urine: 64 mg/dL (ref 20–320)
Microalb Creat Ratio: 509 mcg/mg creat — ABNORMAL HIGH (ref <30)
Microalb, Ur: 32.6 mg/dL

## 2017-04-08 LAB — HEMOGLOBIN A1C
Hgb A1c MFr Bld: 7.3 % — ABNORMAL HIGH (ref <5.7)
Mean Plasma Glucose: 163 mg/dL

## 2017-04-11 ENCOUNTER — Encounter: Payer: Self-pay | Admitting: Pharmacotherapy

## 2017-04-11 ENCOUNTER — Ambulatory Visit (INDEPENDENT_AMBULATORY_CARE_PROVIDER_SITE_OTHER): Payer: Medicare Other | Admitting: Pharmacotherapy

## 2017-04-11 VITALS — BP 130/72 | HR 82 | Resp 12 | Ht 65.0 in | Wt 146.4 lb

## 2017-04-11 DIAGNOSIS — IMO0002 Reserved for concepts with insufficient information to code with codable children: Secondary | ICD-10-CM

## 2017-04-11 DIAGNOSIS — Z794 Long term (current) use of insulin: Secondary | ICD-10-CM

## 2017-04-11 DIAGNOSIS — N183 Chronic kidney disease, stage 3 (moderate): Secondary | ICD-10-CM | POA: Diagnosis not present

## 2017-04-11 DIAGNOSIS — E1122 Type 2 diabetes mellitus with diabetic chronic kidney disease: Secondary | ICD-10-CM

## 2017-04-11 DIAGNOSIS — E1165 Type 2 diabetes mellitus with hyperglycemia: Secondary | ICD-10-CM | POA: Diagnosis not present

## 2017-04-11 DIAGNOSIS — I10 Essential (primary) hypertension: Secondary | ICD-10-CM | POA: Diagnosis not present

## 2017-04-11 NOTE — Patient Instructions (Signed)
Keep up the good work

## 2017-04-11 NOTE — Progress Notes (Signed)
  Subjective:    Monique Cabrera is a 82 y.o.African American female who presents for follow-up of Type 2 diabetes mellitus.   Last A1C was 8.4% - now it is 7.3%.  Much improvement! Currently on Lantus and Humalog.  Sees the neurologist 04/19/17. Has blurry vision - has been "off" since her recent stroke. Her balance is still "off".  She is getting better about asking for help. Every morning has a non-specific "sick" episode.  No vomiting, diarrhea, constipation, nausea.  "I just feel sick".  Did not occur today. She is starting to feel a lot better. Wearing a scopolamine patch behind left ear.  Average BG: 150-160 range No hypoglycemia  Walking daily for exercise. Tries to eat healthy. No peripheral edema. Denies problems with feet. Nocturia at least once per night No dysuria. Staying well hydrated.   Review of Systems A comprehensive review of systems was negative except for: Eyes: positive for contacts/glasses and some blurriness Genitourinary: positive for nocturia Neurological: positive for vertigo    Objective:    BP 130/72   Pulse 82   Resp 12   Ht 5' 5"$  (1.651 m)   Wt 146 lb 6.4 oz (66.4 kg)   SpO2 98%   BMI 24.36 kg/m   General:  alert, cooperative and no distress  Oropharynx: normal findings: lips normal without lesions and gums healthy   Eyes:  negative findings: lids and lashes normal and conjunctivae and sclerae normal   Ears:  external ears normal        Lung: clear to auscultation bilaterally  Heart:  regular rate and rhythm     Extremities: extremities normal, atraumatic, no cyanosis or edema  Skin: dry     Neuro: mental status, speech normal, alert and oriented x3 and gait and station normal   Lab Review Glucose, Bld (mg/dL)  Date Value  04/07/2017 102 (H)  02/19/2017 149 (H)  02/17/2017 100 (H)   CO2 (mmol/L)  Date Value  04/07/2017 24  02/19/2017 23  02/17/2017 22   BUN (mg/dL)  Date Value  04/07/2017 25  02/19/2017 30 (H)   02/17/2017 26 (H)  06/11/2016 21  04/14/2016 16  01/19/2016 16   Creat (mg/dL)  Date Value  04/07/2017 1.52 (H)  01/06/2017 1.49 (H)  10/07/2016 1.40 (H)   Creatinine, Ser (mg/dL)  Date Value  02/19/2017 1.45 (H)  02/17/2017 1.44 (H)       Assessment:    Diabetes Mellitus type II, under good control.   A1C close to goal <7% BP at goal <130/80   Plan:    1.  Rx changes: none  2.  Continue Lantus 50 units daily 3.  Continue Humalog 8 units breakfast, 12 units lunch & supper. 4.  A1C is the lowest it has been in over a year. 5.  Counseled on nutrition goals 6.  Praised exercise efforts!  Goal is 30-45 minutes 5 x week. 7.  BP at goal <130/80

## 2017-04-12 ENCOUNTER — Ambulatory Visit: Payer: Self-pay

## 2017-04-19 ENCOUNTER — Ambulatory Visit (INDEPENDENT_AMBULATORY_CARE_PROVIDER_SITE_OTHER): Payer: Medicare Other | Admitting: Nurse Practitioner

## 2017-04-19 ENCOUNTER — Encounter: Payer: Self-pay | Admitting: Pharmacotherapy

## 2017-04-19 ENCOUNTER — Encounter: Payer: Self-pay | Admitting: Nurse Practitioner

## 2017-04-19 VITALS — BP 138/71 | HR 72 | Ht 65.0 in | Wt 149.8 lb

## 2017-04-19 DIAGNOSIS — I10 Essential (primary) hypertension: Secondary | ICD-10-CM

## 2017-04-19 DIAGNOSIS — R269 Unspecified abnormalities of gait and mobility: Secondary | ICD-10-CM | POA: Diagnosis not present

## 2017-04-19 DIAGNOSIS — I639 Cerebral infarction, unspecified: Secondary | ICD-10-CM

## 2017-04-19 DIAGNOSIS — E785 Hyperlipidemia, unspecified: Secondary | ICD-10-CM | POA: Diagnosis not present

## 2017-04-19 NOTE — Patient Instructions (Signed)
Stressed the importance of management of risk factors to prevent further stroke Continue aspirin and Plavix for secondary stroke prevention, stop aspirin in May 17, 2017.  Maintain strict control of hypertension with blood pressure goal below 130/90, today's reading 138/71 continue antihypertensive medications Control of diabetes with hemoglobin A1c below 6.5 followed by primary care most recent hemoglobin A1c 7.3  continue diabetic medications Cholesterol with LDL cholesterol less than 70, followed by primary care,   continue Lovastatin Exercise by walking, slowly increase , eat healthy diet with whole grains follow diabetic diet,  fresh fruits and vegetables Discussed risk for recurrent stroke/ TIA and answered additional questions Follow up in 3 months

## 2017-04-19 NOTE — Progress Notes (Signed)
I have read the note, and I agree with the clinical assessment and plan.  Skyeler Smola KEITH   

## 2017-04-19 NOTE — Progress Notes (Signed)
[facility]  PATIENT: Monique Cabrera DOB: [birth year]   REASON FOR VISIT:hospital follow up for stroke HISTORY FROM:patient     HISTORY OF PRESENT ILLNESS: [name], 82 year old female returns for hospital follow-up after having a stroke. She has a history of hypertension diabetes and hyperlipidemia chronic kidney disease anemia peripheral vascular disease and she is legally blind in her left eye. She was admitted on 02/23/2017 for vision changes dizziness and being off balance she was already taking aspirin 81 and Plavix for her peripheral vascular disease. MRI of the brain acute infarct along the floor of the upper fourth ventricle. CTA head and neck high-grade stenosis of the left anterior M2  segment diffuse atherosclerosis. 2-D echo 60-65% EF no cardiac source of emboli identified. LDL 102 hemoglobin A1c 8.4. It was recommended that she be on aspirin 325 Plavix for 3 months then Plavix alone due to her intracranial stenosis. She returns today for reevaluation in the stroke. She has not had further stroke or TIA symptoms. She has no bruising and no bleeding from Plavix and aspirin. She is now on lovastatin for hyperlipidemia without myalgias. Blood pressure in the office today 138/71. She is trying to exercise some. Her therapies have concluded. She is legally blind in the left eye. She returns for reevaluation    REVIEW OF SYSTEMS: Full 14 system review of systems performed and notable only for those listed, all others are neg:  Constitutional: neg  Cardiovascular: neg Ear/Nose/Throat: neg  Skin: neg Eyes: Blurred vision Respiratory: neg Gastroitestinal: neg  Hematology/Lymphatic: neg  Endocrine: neg Musculoskeletal:neg Allergy/Immunology: neg Neurological: neg Psychiatric: neg Sleep : neg   ALLERGIES: Allergies  Allergen Reactions  . Invokana [Canagliflozin] Other (See Comments)    Vagina itching and swelling Vaginal Itching and irritation   .  Jardiance [Empagliflozin] Itching and Other (See Comments)    Vaginal itching and swelling    HOME MEDICATIONS: Outpatient Medications Prior to Visit  Medication Sig Dispense Refill  . amLODipine (NORVASC) 10 MG tablet TAKE ONE TABLET BY MOUTH DAILY FOR HIGH BLOOD PRESSURE 90 tablet 1  . aspirin EC 325 MG tablet Take 1 tablet (325 mg total) by mouth daily. 30 tablet 3  . B-D ULTRAFINE III SHORT PEN 31G X 8 MM MISC USE WITH LANTUS EVERY DAY FOR BLOOD SUGAR 100 each 11  . carvedilol (COREG) 25 MG tablet Take one tablet by mouth twice daily with a meal 180 tablet 3  . clopidogrel (PLAVIX) 75 MG tablet TAKE ONE TABLET BY MOUTH ONCE DAILY 90 tablet 2  . enalapril (VASOTEC) 20 MG tablet Take 2 tablets (40 mg total) by mouth daily. Prescribed by kidney specialist Dr.Powell 30 tablet 0  . glucose blood (ONETOUCH VERIO) test strip Use to test blood sugar three times daily. Dx: E11.9 100 each 12  . Insulin Glargine (LANTUS SOLOSTAR) 100 UNIT/ML Solostar Pen Inject 50 Units into the skin daily. 15 pen 7  . insulin lispro (HUMALOG KWIKPEN) 100 UNIT/ML KiwkPen 8 units breakfast, 12 units lunch & supper 5 pen 1  . lovastatin (MEVACOR) 20 MG tablet TAKE 1 TABLET (20 MG TOTAL) BY MOUTH AT BEDTIME FOR CHOLESTEROL. 30 tablet 2  . pantoprazole (PROTONIX) 40 MG tablet Take 1 tablet (40 mg total) by mouth daily. 90 tablet 3  . scopolamine (TRANSDERM-SCOP) 1 MG/3DAYS Place 1 patch (1.5 mg total) onto the skin every 3 (three) days. 10 patch 12  . valsartan-hydrochlorothiazide (DIOVAN-HCT) 320-25 MG tablet TAKE ONE TABLET BY MOUTH ONCE DAILY  90 tablet 2   No facility-administered medications prior to visit.     PAST MEDICAL HISTORY: Past Medical History:  Diagnosis Date  . Acute upper respiratory infections of unspecified site   . Anemia   . Anemia, unspecified   . Atherosclerosis of native arteries of the extremities, unspecified   . Chest pain, unspecified   . Chronic kidney disease (CKD), stage II (mild)    . Diabetes mellitus   . Diarrhea   . Disorder of bone and cartilage, unspecified   . DM (diabetes mellitus) type II controlled with renal manifestation ([place])   . Herpes zoster with other nervous system complications(053.19)   . Hypercalcemia   . Hypertension   . Hypertensive renal disease, benign   . Nonspecific reaction to tuberculin skin test without active tuberculosis(795.51)   . Nonspecific tuberculin test reaction   . Other and unspecified hyperlipidemia   . Pain in joint, lower leg   . Peripheral arterial disease ([place])   . Postherpetic neuralgia   . Proteinuria   . Stroke ([place]) 01/2017  . Type II or unspecified type diabetes mellitus with renal manifestations, not stated as uncontrolled(250.40)   . Type II or unspecified type diabetes mellitus with renal manifestations, uncontrolled(250.42)   . Unspecified disorder of kidney and ureter   . Unspecified essential hypertension     PAST SURGICAL HISTORY: Past Surgical History:  Procedure Laterality Date  . hysterectomy    . INCISION AND DRAINAGE Left 05/27/14   sebacous cyst, ear  . removal of cyst from hand    . removal of tumor from foot    . TONSILLECTOMY      FAMILY HISTORY: Family History  Problem Relation Age of Onset  . Diabetes Mother   . Diabetes Father   . Diabetes Sister   . Diabetes Sister     SOCIAL HISTORY: Social History   Social History  . Marital status: Widowed    Spouse name: N/A  . Number of children: 6  . Years of education: 64   Occupational History  .      retired, [employer]   Social History Main Topics  . Smoking status: Former Smoker    Types: Cigarettes  . Smokeless tobacco: Never Used     Comment: Quit about age 67   . Alcohol use No  . Drug use: No  . Sexual activity: No   Other Topics Concern  . Not on file   Social History Narrative   Lives alone, son there occass   Caffeine- coffee 2-3 cups daily, soda off and on     PHYSICAL EXAM  Vitals:   04/19/17 1239  BP:  138/71  Pulse: 72  Weight: 149 lb 12.8 oz (67.9 kg)  Height: 5\' 5"  (1.651 m)   Body mass index is 24.93 kg/m.  Generalized: Well developed, in no acute distress  Head: normocephalic and atraumatic,. Oropharynx benign  Neck: Supple, no carotid bruits  Cardiac: Regular rate rhythm, no murmur  Musculoskeletal: No deformity   Neurological examination   Mentation: Alert oriented to time, place, history taking. Attention span and concentration appropriate. Recent and remote memory intact.  Follows all commands speech and language fluent.   Cranial nerve II-XII: Extraocular movements were full Except left adduction impaired, visual field were full on confrontational test. Facial sensation and strength were normal. hearing was intact to finger rubbing bilaterally. Uvula tongue midline. head turning and shoulder shrug were normal and symmetric.Tongue protrusion into cheek strength was normal. Motor: normal  bulk and tone, full strength in the BUE, BLE, fine finger movements normal, no pronator drift. No focal weakness Sensory: normal and symmetric to light touch, pinprick, and  Vibration, in the upper and lower extremities Coordination: finger-nose-finger, heel-to-shin bilaterally, no dysmetria Reflexes: 1+ upper lower and symmetric plantar responses were flexor bilaterally. Gait and Station: Rising up from seated position without assistance, normal stance,  moderate stride, good arm swing, smooth turning, able to perform tiptoe, and heel walking without difficulty. Tandem gait is mildly unsteady  DIAGNOSTIC DATA (LABS, IMAGING, TESTING) - I reviewed patient records, labs, notes, testing and imaging myself where available.  Lab Results  Component Value Date   WBC 6.2 02/19/2017   HGB 10.9 (L) 02/19/2017   HCT 32.3 (L) 02/19/2017   MCV 88.5 02/19/2017   PLT 228 02/19/2017      Component Value Date/Time   NA 136 04/07/2017 0915   NA 136 06/11/2016 1025   K 4.0 04/07/2017 0915   CL 104  04/07/2017 0915   CO2 24 04/07/2017 0915   GLUCOSE 102 (H) 04/07/2017 0915   BUN 25 04/07/2017 0915   BUN 21 06/11/2016 1025   CREATININE 1.52 (H) 04/07/2017 0915   CALCIUM 10.0 04/07/2017 0915   PROT 6.7 04/07/2017 0915   PROT 7.1 04/14/2016 0922   ALBUMIN 3.8 04/07/2017 0915   ALBUMIN 4.1 04/14/2016 0922   AST 17 04/07/2017 0915   ALT 17 04/07/2017 0915   ALKPHOS 57 04/07/2017 0915   BILITOT 0.4 04/07/2017 0915   BILITOT 0.4 04/14/2016 0922   GFRNONAA 32 (L) 04/07/2017 0915   GFRAA 37 (L) 04/07/2017 0915   Lab Results  Component Value Date   CHOL 187 02/17/2017   HDL 61 02/17/2017   LDLCALC 102 (H) 02/17/2017   TRIG 121 02/17/2017   CHOLHDL 3.1 02/17/2017   Lab Results  Component Value Date   HGBA1C 7.3 (H) 04/07/2017   Lab Results  Component Value Date   VITAMINB12 274 02/18/2017   ASSESSMENT AND PLAN  82 y.o. year old female  has a past medical history of  Chronic kidney disease (CKD), stage II (mild);  DM (diabetes mellitus) type II controlled with renal manifestation ([place]); Hypertension; Hypertensive renal disease, benign;  Peripheral arterial disease ([place]);  Stroke [facility]) (01/2017);  here for hospital follow up. The patient is a current patient of Dr. Erlinda Hong  who is out of the office today . This note is sent to the work in doctor.      PLAN: Stressed the importance of management of risk factors to prevent further stroke Continue aspirin and Plavix for secondary stroke prevention, stop aspirin in May 17, 2017.  Maintain strict control of hypertension with blood pressure goal below 130/90, today's reading 138/71 continue antihypertensive medications Control of diabetes with hemoglobin A1c below 6.5 followed by primary care most recent hemoglobin A1c 7.3  continue diabetic medications Cholesterol with LDL cholesterol less than 70, followed by primary care,   continue Lovastatin Exercise by walking, slowly increase , eat healthy diet with whole grains follow diabetic diet,   fresh fruits and vegetables Discussed risk for recurrent stroke/ TIA and answered additional questions Follow up in 3 months This was a prolonged visit requiring 30 minutes and medical decision making of high complexity with extensive review of history, hospital chart, counseling and answering questions Dennie Bible, [facility], [facility], APRN  [facility] [address] [place], [place] [address] [phone]

## 2017-06-03 DIAGNOSIS — Z7902 Long term (current) use of antithrombotics/antiplatelets: Secondary | ICD-10-CM | POA: Diagnosis not present

## 2017-06-03 DIAGNOSIS — E11649 Type 2 diabetes mellitus with hypoglycemia without coma: Secondary | ICD-10-CM | POA: Diagnosis not present

## 2017-06-03 DIAGNOSIS — Z7982 Long term (current) use of aspirin: Secondary | ICD-10-CM | POA: Diagnosis not present

## 2017-06-03 DIAGNOSIS — R471 Dysarthria and anarthria: Secondary | ICD-10-CM | POA: Diagnosis not present

## 2017-06-03 DIAGNOSIS — Z794 Long term (current) use of insulin: Secondary | ICD-10-CM | POA: Diagnosis not present

## 2017-06-03 DIAGNOSIS — R7989 Other specified abnormal findings of blood chemistry: Secondary | ICD-10-CM | POA: Diagnosis not present

## 2017-06-03 DIAGNOSIS — Z79899 Other long term (current) drug therapy: Secondary | ICD-10-CM | POA: Diagnosis not present

## 2017-06-03 DIAGNOSIS — I10 Essential (primary) hypertension: Secondary | ICD-10-CM | POA: Diagnosis not present

## 2017-06-10 ENCOUNTER — Ambulatory Visit (INDEPENDENT_AMBULATORY_CARE_PROVIDER_SITE_OTHER): Payer: Medicare Other | Admitting: Internal Medicine

## 2017-06-10 ENCOUNTER — Encounter: Payer: Self-pay | Admitting: Internal Medicine

## 2017-06-10 VITALS — BP 132/60 | HR 60 | Temp 98.4°F | Wt 151.0 lb

## 2017-06-10 DIAGNOSIS — E1165 Type 2 diabetes mellitus with hyperglycemia: Secondary | ICD-10-CM | POA: Diagnosis not present

## 2017-06-10 DIAGNOSIS — Z8673 Personal history of transient ischemic attack (TIA), and cerebral infarction without residual deficits: Secondary | ICD-10-CM | POA: Diagnosis not present

## 2017-06-10 DIAGNOSIS — E1122 Type 2 diabetes mellitus with diabetic chronic kidney disease: Secondary | ICD-10-CM

## 2017-06-10 DIAGNOSIS — Z794 Long term (current) use of insulin: Secondary | ICD-10-CM

## 2017-06-10 DIAGNOSIS — N182 Chronic kidney disease, stage 2 (mild): Secondary | ICD-10-CM

## 2017-06-10 DIAGNOSIS — N183 Chronic kidney disease, stage 3 unspecified: Secondary | ICD-10-CM

## 2017-06-10 DIAGNOSIS — H8149 Vertigo of central origin, unspecified ear: Secondary | ICD-10-CM | POA: Diagnosis not present

## 2017-06-10 DIAGNOSIS — I10 Essential (primary) hypertension: Secondary | ICD-10-CM | POA: Diagnosis not present

## 2017-06-10 DIAGNOSIS — H814 Vertigo of central origin: Secondary | ICD-10-CM

## 2017-06-10 DIAGNOSIS — IMO0002 Reserved for concepts with insufficient information to code with codable children: Secondary | ICD-10-CM

## 2017-06-10 DIAGNOSIS — K219 Gastro-esophageal reflux disease without esophagitis: Secondary | ICD-10-CM

## 2017-06-10 NOTE — Patient Instructions (Signed)
Stop enalapril Stop aspirin (finished 3 months of therapy after stroke).

## 2017-06-10 NOTE — Progress Notes (Signed)
Location:  [facility] clinic Provider:  Koda Defrank L. Mariea Clonts, D.O., C.M.D.  Code Status: full code Goals of Care:  Advanced Directives 06/10/2017  Does Patient Have a Medical Advance Directive? No  Would patient like information on creating a medical advance directive? No - Patient declined  Pre-existing out of facility DNR order (yellow form or pink MOST form) -     Chief Complaint  Patient presents with  . Medical Management of Chronic Issues    108mth follow-up    HPI: Patient is a 82 y.o. female seen today for medical management of chronic diseases.    6/8, she had another hypoglycemia episode.  She ate a small breakfast (sounds like mostly carbs except the PB) and then traveled to visit her sister.  They did not eat at lunchtime, and the family was busy so it got late.  They were about to go eat, and she thought she was going to make it, but her nephew saw her acting strange, she fell asleep.  She has the candy in her pocket book and the glucose pills, but family did not know what to do.  They took her to the hospital.  She ate there and was ok.    Started with having nausea in the mornings.  2 mos after her stroke, that stopped.  Now it is happening in the afternoons. Does not actually vomit, but feels so nauseous.  Happening daily.  Yesterday, took her insulin, but no pills so thinks it is some pill.  Having a glass of OJ seems to help the feeling. She also feels like she needs air and has to go outside on the porch.  Does eat before her pills.  CBGs are 170s or 200s in the afternoons.    Discussed with her that Dr. Florene Glen approved discontinuing either her enalapril or her valsartan/hctz.  We picked enalapril today.    Tongue will burn when she eats at times and foods don't taste as good.  Happening since stroke.    She has continued to use the scopolamine patches since her stroke.  When she first gets up, she is sometimes unsteady.  Still bumps into things more.  Also seems like this happens  when she is due to change the patch.  Past Medical History:  Diagnosis Date  . Acute upper respiratory infections of unspecified site   . Anemia   . Anemia, unspecified   . Atherosclerosis of native arteries of the extremities, unspecified   . Chest pain, unspecified   . Chronic kidney disease (CKD), stage II (mild)   . Diabetes mellitus   . Diarrhea   . Disorder of bone and cartilage, unspecified   . DM (diabetes mellitus) type II controlled with renal manifestation ([place])   . Herpes zoster with other nervous system complications(053.19)   . Hypercalcemia   . Hypertension   . Hypertensive renal disease, benign   . Nonspecific reaction to tuberculin skin test without active tuberculosis(795.51)   . Nonspecific tuberculin test reaction   . Other and unspecified hyperlipidemia   . Pain in joint, lower leg   . Peripheral arterial disease ([place])   . Postherpetic neuralgia   . Proteinuria   . Stroke ([place]) 01/2017  . Type II or unspecified type diabetes mellitus with renal manifestations, not stated as uncontrolled(250.40)   . Type II or unspecified type diabetes mellitus with renal manifestations, uncontrolled(250.42)   . Unspecified disorder of kidney and ureter   . Unspecified essential hypertension  Past Surgical History:  Procedure Laterality Date  . hysterectomy    . INCISION AND DRAINAGE Left 05/27/14   sebacous cyst, ear  . removal of cyst from hand    . removal of tumor from foot    . TONSILLECTOMY      Allergies  Allergen Reactions  . Invokana [Canagliflozin] Other (See Comments)    Vagina itching and swelling Vaginal Itching and irritation   . Jardiance [Empagliflozin] Itching and Other (See Comments)    Vaginal itching and swelling    Allergies as of 06/10/2017      Reactions   Invokana [canagliflozin] Other (See Comments)   Vagina itching and swelling Vaginal Itching and irritation    Jardiance [empagliflozin] Itching, Other (See Comments)   Vaginal  itching and swelling      Medication List       Accurate as of 06/10/17  9:58 AM. Always use your most recent med list.          amLODipine 10 MG tablet Commonly known as:  NORVASC TAKE ONE TABLET BY MOUTH DAILY FOR HIGH BLOOD PRESSURE   aspirin EC 325 MG tablet Take 1 tablet (325 mg total) by mouth daily.   B-D ULTRAFINE III SHORT PEN 31G X 8 MM Misc Generic drug:  Insulin Pen Needle USE WITH LANTUS EVERY DAY FOR BLOOD SUGAR   carvedilol 25 MG tablet Commonly known as:  COREG Take one tablet by mouth twice daily with a meal   clopidogrel 75 MG tablet Commonly known as:  PLAVIX TAKE ONE TABLET BY MOUTH ONCE DAILY   enalapril 20 MG tablet Commonly known as:  VASOTEC Take 2 tablets (40 mg total) by mouth daily. Prescribed by kidney specialist Dr.Powell   glucose blood test strip Commonly known as:  ONETOUCH VERIO Use to test blood sugar three times daily. Dx: E11.9   Insulin Glargine 100 UNIT/ML Solostar Pen Commonly known as:  LANTUS SOLOSTAR Inject 50 Units into the skin daily.   insulin lispro 100 UNIT/ML KiwkPen Commonly known as:  HUMALOG KWIKPEN 8 units breakfast, 12 units lunch & supper   lovastatin 20 MG tablet Commonly known as:  MEVACOR TAKE 1 TABLET (20 MG TOTAL) BY MOUTH AT BEDTIME FOR CHOLESTEROL.   pantoprazole 40 MG tablet Commonly known as:  PROTONIX Take 1 tablet (40 mg total) by mouth daily.   scopolamine 1 MG/3DAYS Commonly known as:  TRANSDERM-SCOP Place 1 patch (1.5 mg total) onto the skin every 3 (three) days.   valsartan-hydrochlorothiazide 320-25 MG tablet Commonly known as:  DIOVAN-HCT TAKE ONE TABLET BY MOUTH ONCE DAILY       Review of Systems:  Review of Systems  Constitutional: Negative for chills, fever and malaise/fatigue.  HENT:       Mouth pain  Eyes: Positive for blurred vision.  Respiratory: Negative for shortness of breath.   Cardiovascular: Negative for chest pain and palpitations.  Gastrointestinal: Negative  for abdominal pain.  Genitourinary: Negative for dysuria.  Musculoskeletal: Positive for joint pain. Negative for falls and myalgias.       In left foot  Skin: Negative for itching and rash.  Neurological: Positive for tingling and sensory change. Negative for weakness.  Endo/Heme/Allergies:       Diabetes with hypoglycemia issues from not eating  Psychiatric/Behavioral: Positive for memory loss. Negative for depression.    Health Maintenance  Topic Date Due  . OPHTHALMOLOGY EXAM  08/03/2016  . FOOT EXAM  06/11/2017  . INFLUENZA VACCINE  07/27/2017  .  HEMOGLOBIN A1C  10/07/2017  . DEXA SCAN  Completed  . PNA vac Low Risk Adult  Completed    Physical Exam: Vitals:   06/10/17 0950  BP: (!) 150/80  Pulse: 60  Temp: 98.4 F (36.9 C)  TempSrc: Oral  SpO2: 97%  Weight: 151 lb (68.5 kg)   Body mass index is 25.13 kg/m. Physical Exam  Constitutional: She is oriented to person, place, and time. She appears well-developed and well-nourished. No distress.  Cardiovascular: Normal rate, regular rhythm, normal heart sounds and intact distal pulses.   Pulmonary/Chest: Effort normal and breath sounds normal. No respiratory distress.  Musculoskeletal: Normal range of motion.  Neurological: She is alert and oriented to person, place, and time. A sensory deficit is present.  Some short term memory loss   Skin: Skin is warm and dry. Capillary refill takes less than 2 seconds.  Scab on right MTP joint  Psychiatric: She has a normal mood and affect.    Labs reviewed: Basic Metabolic Panel:  Recent Labs  02/17/17 0915 02/19/17 0349 04/07/17 0915  NA 136 136 136  K 3.5 3.5 4.0  CL 103 103 104  CO2 22 23 24   GLUCOSE 100* 149* 102*  BUN 26* 30* 25  CREATININE 1.44* 1.45* 1.52*  CALCIUM 10.1 9.9 10.0   Liver Function Tests:  Recent Labs  01/06/17 0927 02/19/17 0349 04/07/17 0915  AST 14 15 17   ALT 7 10* 17  ALKPHOS 68 53 57  BILITOT 0.3 0.5 0.4  PROT 6.6 6.3* 6.7    ALBUMIN 3.7 3.3* 3.8   No results for input(s): LIPASE, AMYLASE in the last 8760 hours. No results for input(s): AMMONIA in the last 8760 hours. CBC:  Recent Labs  02/17/17 0915 02/18/17 1230 02/19/17 0349  WBC 8.1  --  6.2  NEUTROABS 5.3  --   --   HGB 11.7*  --  10.9*  HCT 34.1* 35.8 32.3*  MCV 89.0  --  88.5  PLT 222  --  228   Lipid Panel:  Recent Labs  02/17/17 2355  CHOL 187  HDL 61  LDLCALC 102*  TRIG 121  CHOLHDL 3.1   Lab Results  Component Value Date   HGBA1C 7.3 (H) 04/07/2017    Assessment/Plan 1. Uncontrolled type 2 diabetes mellitus with stage 3 chronic kidney disease, with long-term current use of insulin (HCC) - cont ARB, lantus and humalog - again counseled on importance of eating regularly and not missing her usual meal times -advised to carry something in her purse to eat if she's with others not ready to eat, already has her glucose tabs and candies  - Ambulatory referral to Podiatry due to foot pain and needs regular toenail trimming with declining vision  2. Essential hypertension, benign -bp at goal on recheck -stop enalapril due to duplicate therapy with valsartan/hctz and approved by Dr. Florene Glen per [facility] consult--appreciate her assistance with this b/c it's been ongoing and we've been unable to get an answer for a long time before this  3. History of stroke -has residual vertigo, increased cognitive loss, and unusual burning pain in her mouth with majority of foods--no thrush seen -per neurology, pt was only to be on asa for 3 mos after stroke, so will d/c asa 325mg , continue plavix  4. Central nervous system origin vertigo, unspecified laterality -due to stroke in February -cont scopolamine patches b/c she is still starting with vertigo when they are wearing off  5. Chronic kidney disease (CKD),  stage II (mild) -Avoid nephrotoxic agents like nsaids, dose adjust renally excreted meds, hydrate. -cont f/u with Dr. Florene Glen  6.  Gastroesophageal reflux disease without esophagitis -cont protonix -suspect she had some exacerbation of this in afternoons b/c of asa and plavix  Labs/tests ordered:  No new, has labs before visit with Cathey--keep these Next appt:  10/06/2017   Octa Uplinger L. Owyn Raulston, D.O. [facility] [address], [place] [address] Cell Phone (Mon-Fri 8am-5pm):  [phone] On Call:  [phone] & follow prompts after 5pm & weekends Office Phone:  [phone] Office Fax:  [phone]

## 2017-06-20 ENCOUNTER — Other Ambulatory Visit: Payer: Self-pay | Admitting: Internal Medicine

## 2017-06-20 ENCOUNTER — Other Ambulatory Visit: Payer: Self-pay | Admitting: Nurse Practitioner

## 2017-06-25 ENCOUNTER — Other Ambulatory Visit: Payer: Self-pay | Admitting: Internal Medicine

## 2017-06-26 ENCOUNTER — Encounter (HOSPITAL_COMMUNITY): Payer: Self-pay | Admitting: Emergency Medicine

## 2017-06-26 ENCOUNTER — Emergency Department (HOSPITAL_COMMUNITY)
Admission: EM | Admit: 2017-06-26 | Discharge: 2017-06-27 | Disposition: A | Discharge status: Home / Self Care | Payer: Medicare Other | Attending: Emergency Medicine | Admitting: Emergency Medicine

## 2017-06-26 DIAGNOSIS — D649 Anemia, unspecified: Secondary | ICD-10-CM | POA: Diagnosis not present

## 2017-06-26 DIAGNOSIS — Z8673 Personal history of transient ischemic attack (TIA), and cerebral infarction without residual deficits: Secondary | ICD-10-CM | POA: Diagnosis not present

## 2017-06-26 DIAGNOSIS — Z79899 Other long term (current) drug therapy: Secondary | ICD-10-CM | POA: Insufficient documentation

## 2017-06-26 DIAGNOSIS — N289 Disorder of kidney and ureter, unspecified: Secondary | ICD-10-CM

## 2017-06-26 DIAGNOSIS — N189 Chronic kidney disease, unspecified: Secondary | ICD-10-CM | POA: Diagnosis not present

## 2017-06-26 DIAGNOSIS — Z87891 Personal history of nicotine dependence: Secondary | ICD-10-CM | POA: Diagnosis not present

## 2017-06-26 DIAGNOSIS — E1165 Type 2 diabetes mellitus with hyperglycemia: Secondary | ICD-10-CM | POA: Diagnosis not present

## 2017-06-26 DIAGNOSIS — I129 Hypertensive chronic kidney disease with stage 1 through stage 4 chronic kidney disease, or unspecified chronic kidney disease: Secondary | ICD-10-CM | POA: Diagnosis not present

## 2017-06-26 DIAGNOSIS — N182 Chronic kidney disease, stage 2 (mild): Secondary | ICD-10-CM | POA: Diagnosis not present

## 2017-06-26 DIAGNOSIS — E871 Hypo-osmolality and hyponatremia: Secondary | ICD-10-CM | POA: Diagnosis not present

## 2017-06-26 DIAGNOSIS — R739 Hyperglycemia, unspecified: Secondary | ICD-10-CM

## 2017-06-26 LAB — BASIC METABOLIC PANEL
Anion gap: 8 (ref 5–15)
BUN: 25 mg/dL — ABNORMAL HIGH (ref 6–20)
CHLORIDE: 102 mmol/L (ref 101–111)
CO2: 20 mmol/L — AB (ref 22–32)
CREATININE: 1.84 mg/dL — AB (ref 0.44–1.00)
Calcium: 9.4 mg/dL (ref 8.9–10.3)
GFR calc non Af Amer: 25 mL/min — ABNORMAL LOW (ref >60)
GFR, EST AFRICAN AMERICAN: 29 mL/min — AB (ref >60)
GLUCOSE: 209 mg/dL — AB (ref 65–99)
Potassium: 3.5 mmol/L (ref 3.5–5.1)
Sodium: 130 mmol/L — ABNORMAL LOW (ref 135–145)

## 2017-06-26 LAB — URINALYSIS, ROUTINE W REFLEX MICROSCOPIC
Bilirubin Urine: NEGATIVE
Glucose, UA: 150 mg/dL — AB
Hgb urine dipstick: NEGATIVE
Ketones, ur: NEGATIVE mg/dL
Leukocytes, UA: NEGATIVE
Nitrite: NEGATIVE
PROTEIN: 100 mg/dL — AB
Specific Gravity, Urine: 1.008 (ref 1.005–1.030)
pH: 6 (ref 5.0–8.0)

## 2017-06-26 LAB — CBC
HCT: 30.3 % — ABNORMAL LOW (ref 36.0–46.0)
Hemoglobin: 10.4 g/dL — ABNORMAL LOW (ref 12.0–15.0)
MCH: 30.7 pg (ref 26.0–34.0)
MCHC: 34.3 g/dL (ref 30.0–36.0)
MCV: 89.4 fL (ref 78.0–100.0)
Platelets: 218 10*3/uL (ref 150–400)
RBC: 3.39 MIL/uL — AB (ref 3.87–5.11)
RDW: 12.3 % (ref 11.5–15.5)
WBC: 7 10*3/uL (ref 4.0–10.5)

## 2017-06-26 LAB — CBG MONITORING, ED: GLUCOSE-CAPILLARY: 189 mg/dL — AB (ref 65–99)

## 2017-06-26 NOTE — ED Provider Notes (Signed)
[place] DEPT Provider Note   CSN: [id number] Arrival date & time: 06/26/17  2241  By signing my name below, I, Mayer Masker, attest that this documentation has been prepared under the direction and in the presence of Delora Fuel, MD. Electronically Signed: Mayer Masker, Scribe. 06/26/17. 11:23 PM.  History   Chief Complaint Chief Complaint  Patient presents with  . Hyperglycemia   The history is provided by the patient. No language interpreter was used.    HPI Comments: Monique Cabrera is a 82 y.o. female who presents to the Emergency Department complaining of rapid-onset hyperglycemia that occurred prior to arrival. She states she checked her blood sugar and it was 400 mg/dl, but thinks her meter might be off because it was fluctuating when she took another reading. She denies increased urination, nausea, vomiting, or any pain. She did not take any extra insulin when she checked her blood sugar.  Past Medical History:  Diagnosis Date  . Acute upper respiratory infections of unspecified site   . Anemia   . Anemia, unspecified   . Atherosclerosis of native arteries of the extremities, unspecified   . Chest pain, unspecified   . Chronic kidney disease (CKD), stage II (mild)   . Diabetes mellitus   . Diarrhea   . Disorder of bone and cartilage, unspecified   . DM (diabetes mellitus) type II controlled with renal manifestation ([place])   . Herpes zoster with other nervous system complications(053.19)   . Hypercalcemia   . Hypertension   . Hypertensive renal disease, benign   . Nonspecific reaction to tuberculin skin test without active tuberculosis(795.51)   . Nonspecific tuberculin test reaction   . Other and unspecified hyperlipidemia   . Pain in joint, lower leg   . Peripheral arterial disease ([place])   . Postherpetic neuralgia   . Proteinuria   . Stroke ([place]) 01/2017  . Type II or unspecified type diabetes mellitus with renal manifestations, not stated as  uncontrolled(250.40)   . Type II or unspecified type diabetes mellitus with renal manifestations, uncontrolled(250.42)   . Unspecified disorder of kidney and ureter   . Unspecified essential hypertension     Patient Active Problem List   Diagnosis Date Noted  . Acute CVA (cerebrovascular accident) ([place]) 02/18/2017  . Acute ischemic stroke ([place])   . Internuclear ophthalmoplegia of left eye   . Benign paroxysmal positional vertigo   . Abnormality of gait   . Acute onset of severe vertigo 02/17/2017  . Vertigo 02/17/2017  . Atherosclerosis of native arteries of the extremities with intermittent claudication 03/11/2014  . Diabetic retinopathy ([place]) 03/11/2014  . Retinal hemorrhage due to secondary diabetes ([place]) 03/11/2014  . Type II diabetes mellitus with renal manifestations, uncontrolled ([place]) 03/11/2014  . Hepatitis C 03/11/2014  . Hyperlipidemia 03/11/2014  . Hypoglycemia 04/16/2013  . Disorder of bone and cartilage, unspecified   . Other and unspecified hyperlipidemia   . Essential hypertension, benign   . Atherosclerosis of native arteries of the extremities, unspecified   . Chronic kidney disease (CKD), stage II (mild)   . Peripheral arterial disease ([place])   . Anemia   . Postherpetic neuralgia   . Hypertension     Past Surgical History:  Procedure Laterality Date  . hysterectomy    . INCISION AND DRAINAGE Left 05/27/14   sebacous cyst, ear  . removal of cyst from hand    . removal of tumor from foot    . TONSILLECTOMY      OB History  No data available       Home Medications    Prior to Admission medications   Medication Sig Start Date End Date Taking? Authorizing Provider  amLODipine (NORVASC) 10 MG tablet TAKE ONE TABLET BY MOUTH DAILY FOR HIGH BLOOD PRESSURE 03/18/17   Reed, Tiffany L, DO  carvedilol (COREG) 25 MG tablet TAKE 1 TABLET BY MOUTH TWICE A DAY WITH A MEAL 06/20/17   Reed, Tiffany L, DO  clopidogrel (PLAVIX) 75 MG tablet TAKE ONE TABLET BY MOUTH  ONCE DAILY 03/07/17   Reed, Tiffany L, DO  glucose blood (ONETOUCH VERIO) test strip Use to test blood sugar three times daily. Dx: E11.9 03/01/17   Reed, Tiffany L, DO  Insulin Glargine (LANTUS SOLOSTAR) 100 UNIT/ML Solostar Pen Inject 50 Units into the skin daily. 01/10/17   Tivis Ringer, RPH-CPP  Insulin Glargine (LANTUS SOLOSTAR) 100 UNIT/ML Solostar Pen Inject 50 Units into the skin daily at 10 pm. 06/20/17   Mariea Clonts, Tiffany L, DO  insulin lispro (HUMALOG KWIKPEN) 100 UNIT/ML KiwkPen 8 units breakfast, 12 units lunch & supper 01/10/17   Tivis Ringer, RPH-CPP  Insulin Pen Needle (B-D ULTRAFINE III SHORT PEN) 31G X 8 MM MISC Use to check blood sugar every day. Dx: 11.29; 11.65 06/20/17   Reed, Tiffany L, DO  lovastatin (MEVACOR) 20 MG tablet TAKE 1 TABLET (20 MG TOTAL) BY MOUTH AT BEDTIME FOR CHOLESTEROL. 03/31/17   Reed, Tiffany L, DO  pantoprazole (PROTONIX) 40 MG tablet Take 1 tablet (40 mg total) by mouth daily. 06/08/16   Reed, Tiffany L, DO  scopolamine (TRANSDERM-SCOP) 1 MG/3DAYS Place 1 patch (1.5 mg total) onto the skin every 3 (three) days. 03/15/17   Reed, Tiffany L, DO  valsartan-hydrochlorothiazide (DIOVAN-HCT) 320-25 MG tablet TAKE ONE TABLET BY MOUTH ONCE DAILY 03/07/17   [name], DO    Family History Family History  Problem Relation Age of Onset  . Diabetes Mother   . Diabetes Father   . Diabetes Sister   . Diabetes Sister     Social History Social History  Substance Use Topics  . Smoking status: Former Smoker    Types: Cigarettes  . Smokeless tobacco: Never Used     Comment: Quit about age 89   . Alcohol use No     Allergies   Invokana [canagliflozin] and Jardiance [empagliflozin]   Review of Systems Review of Systems  Gastrointestinal: Negative for nausea and vomiting.  Genitourinary: Negative for frequency.  All other systems reviewed and are negative.    Physical Exam Updated Vital Signs BP (!) 180/69 (BP Location: Left Arm)   Pulse 72   Temp 98.4  F (36.9 C) (Oral)   Resp 18   Ht 5\' 5"  (1.651 m)   Wt 147 lb (66.7 kg)   SpO2 99%   BMI 24.46 kg/m   Physical Exam  Constitutional: She is oriented to person, place, and time. She appears well-developed and well-nourished.  HENT:  Head: Normocephalic and atraumatic.  Eyes: EOM are normal. Pupils are equal, round, and reactive to light.  Neck: Normal range of motion. Neck supple. No JVD present.  Cardiovascular: Normal rate, regular rhythm and normal heart sounds.   No murmur heard. Pulmonary/Chest: Effort normal and breath sounds normal. She has no wheezes. She has no rales. She exhibits no tenderness.  Abdominal: Soft. Bowel sounds are normal. She exhibits no distension and no mass. There is no tenderness.  Musculoskeletal: Normal range of motion. She exhibits no edema.  Lymphadenopathy:  She has no cervical adenopathy.  Neurological: She is alert and oriented to person, place, and time. No cranial nerve deficit. She exhibits normal muscle tone. Coordination normal.  Skin: Skin is warm and dry. No rash noted.  Psychiatric: She has a normal mood and affect. Her behavior is normal. Judgment and thought content normal.  Nursing note and vitals reviewed.    ED Treatments / Results  DIAGNOSTIC STUDIES: Oxygen Saturation is 99% on RA, normal by my interpretation.    COORDINATION OF CARE: 11:23 PM Discussed treatment plan with pt at bedside and pt agreed to plan.  Labs (all labs ordered are listed, but only abnormal results are displayed) Labs Reviewed  BASIC METABOLIC PANEL - Abnormal; Notable for the following:       Result Value   Sodium 130 (*)    CO2 20 (*)    Glucose, Bld 209 (*)    BUN 25 (*)    Creatinine, Ser 1.84 (*)    GFR calc non Af Amer 25 (*)    GFR calc Af Amer 29 (*)    All other components within normal limits  CBC - Abnormal; Notable for the following:    RBC 3.39 (*)    Hemoglobin 10.4 (*)    HCT 30.3 (*)    All other components within normal  limits  URINALYSIS, ROUTINE W REFLEX MICROSCOPIC - Abnormal; Notable for the following:    Color, Urine STRAW (*)    Glucose, UA 150 (*)    Protein, ur 100 (*)    Bacteria, UA RARE (*)    Squamous Epithelial / LPF 0-5 (*)    All other components within normal limits  CBG MONITORING, ED - Abnormal; Notable for the following:    Glucose-Capillary 189 (*)    All other components within normal limits   Procedures Procedures (including critical care time)  Medications Ordered in ED Medications - No data to display   Initial Impression / Assessment and Plan / ED Course  I have reviewed the triage vital signs and the nursing notes.  Pertinent lab results that were available during my care of the patient were reviewed by me and considered in my medical decision making (see chart for details).  Report of elevated glucose at home, but glucose only mildly elevated in the ED. Suspect malfunctioning monitor at home. She has not changed batteries and long time, and that may be the source of her incorrect readings. Screening labs obtained here do show worsening of baseline renal function. Mild hyponatremia is present which is not felt to be clinically significant. Mild anemia is present which is unchanged from baseline and probably represents anemia of chronic disease. Old records were reviewed, and she does have prior ED visits for both hypoglycemia and hyperglycemia. She is referred back to her PCP and only to have her creatinine rechecked to make sure that it is not continuing to worsen.  Final Clinical Impressions(s) / ED Diagnoses   Final diagnoses:  Hyperglycemia  Renal insufficiency  Hyponatremia  Normochromic normocytic anemia    New Prescriptions New Prescriptions   No medications on file   I personally performed the services described in this documentation, which was scribed in my presence. The recorded information has been reviewed and is accurate.       Delora Fuel,  MD 73/22/02 [phone]

## 2017-06-26 NOTE — ED Triage Notes (Signed)
Pt presents C/O of hyperglycemia, checked sugar at home and noted to be upper 400's. No complaints, ambulatory.

## 2017-06-27 ENCOUNTER — Encounter: Payer: Self-pay | Admitting: Nurse Practitioner

## 2017-06-27 ENCOUNTER — Telehealth: Payer: Self-pay

## 2017-06-27 ENCOUNTER — Other Ambulatory Visit: Payer: Self-pay | Admitting: *Deleted

## 2017-06-27 ENCOUNTER — Ambulatory Visit (INDEPENDENT_AMBULATORY_CARE_PROVIDER_SITE_OTHER): Payer: Medicare Other | Admitting: Nurse Practitioner

## 2017-06-27 VITALS — BP 132/80 | HR 76 | Temp 97.7°F | Resp 18 | Ht 65.0 in | Wt 151.4 lb

## 2017-06-27 DIAGNOSIS — E1122 Type 2 diabetes mellitus with diabetic chronic kidney disease: Secondary | ICD-10-CM

## 2017-06-27 DIAGNOSIS — E1165 Type 2 diabetes mellitus with hyperglycemia: Secondary | ICD-10-CM

## 2017-06-27 DIAGNOSIS — E1169 Type 2 diabetes mellitus with other specified complication: Secondary | ICD-10-CM

## 2017-06-27 DIAGNOSIS — N183 Chronic kidney disease, stage 3 (moderate): Secondary | ICD-10-CM | POA: Diagnosis not present

## 2017-06-27 DIAGNOSIS — IMO0002 Reserved for concepts with insufficient information to code with codable children: Secondary | ICD-10-CM

## 2017-06-27 DIAGNOSIS — E108 Type 1 diabetes mellitus with unspecified complications: Secondary | ICD-10-CM | POA: Diagnosis not present

## 2017-06-27 DIAGNOSIS — E785 Hyperlipidemia, unspecified: Principal | ICD-10-CM

## 2017-06-27 DIAGNOSIS — R7309 Other abnormal glucose: Secondary | ICD-10-CM | POA: Diagnosis not present

## 2017-06-27 DIAGNOSIS — Z794 Long term (current) use of insulin: Secondary | ICD-10-CM

## 2017-06-27 MED ORDER — LOVASTATIN 20 MG PO TABS: ORAL_TABLET | ORAL | 0 refills | Status: DC

## 2017-06-27 MED ORDER — ONETOUCH VERIO IQ SYSTEM W/DEVICE KIT: PACK | 0 refills | Status: DC

## 2017-06-27 NOTE — Telephone Encounter (Signed)
Patient walked in c/o fluctuating blood sugar readings. Patient checked blood sugar about 12:15 pm today (non fasting) 49, a few min later patient checked again and it was 67. Last night blood sugar was 450, patient went to the ER.   Patient concerned about readings. Scheduled patient to see Janett Billow @ 1:30 pm

## 2017-06-27 NOTE — Progress Notes (Signed)
Careteam: Patient Care Team: [name], DO as PCP - General (Geriatric Medicine) Estanislado Emms, MD as Consulting Physician (Nephrology) Adrian Prows, MD as Consulting Physician (Cardiology)  Advanced Directive information Does Patient Have a Medical Advance Directive?: No  Allergies  Allergen Reactions  . Invokana [Canagliflozin] Other (See Comments)    Vagina itching and swelling Vaginal Itching and irritation   . Jardiance [Empagliflozin] Itching and Other (See Comments)    Vaginal itching and swelling    Chief Complaint  Patient presents with  . Acute Visit    Pt is being seen due to blood sugars being up and down. She also wants to have glucose meter checked and calibrated.      HPI: Patient is a 82 y.o. female seen in the office today due to blood sugars being up and down.  Pt with hx of DM, anemia, CAD, CKD, hyperlipidemia, htn, CVA, and others She went to the ED yesterday due to blood sugar being 400. When she was in the ED it was 200.  ED thought her meter may be off.  Today her meter has given her several different readings.  This morning her meter read 110 then she was feeling off so she took her blood sugar again and it was 49 but did not feel that bad.  She had eaten bacon, eggs and bread for breakfast. Took blood sugar at 11:30 and that is when she got the 49 reading.  She retook it after a min (did not eat or drink anything) then it was 39 Reports she has had this meter for several years and never changed the battery but her son attempted to change in the other day and when he did it would not turn on so they placed the old battery back into the machine and she questions if it has been working right since. Reports she feels fine but was alarmed by the abnormal readings Pt with hx of uncontrolled diabetes, she has several hypoglycemic episodes in the past and does not eat correctly.   Review of Systems:  Review of Systems  Constitutional: Negative for  chills, fever and malaise/fatigue.  HENT:       Mouth pain  Eyes: Negative for blurred vision.  Respiratory: Negative for shortness of breath.   Cardiovascular: Negative for chest pain and palpitations.  Gastrointestinal: Negative for abdominal pain.  Genitourinary: Negative for dysuria.  Musculoskeletal: Negative for falls.  Skin: Negative for itching and rash.  Neurological: Positive for tingling and sensory change. Negative for weakness.  Endo/Heme/Allergies:       Hx of Diabetes with hypoglycemia issues from not eating Blood sugars off since changing battery in meter.   Psychiatric/Behavioral: Positive for memory loss. Negative for depression.    Past Medical History:  Diagnosis Date  . Acute upper respiratory infections of unspecified site   . Anemia   . Anemia, unspecified   . Atherosclerosis of native arteries of the extremities, unspecified   . Chest pain, unspecified   . Chronic kidney disease (CKD), stage II (mild)   . Diabetes mellitus   . Diarrhea   . Disorder of bone and cartilage, unspecified   . DM (diabetes mellitus) type II controlled with renal manifestation ([place])   . Herpes zoster with other nervous system complications(053.19)   . Hypercalcemia   . Hypertension   . Hypertensive renal disease, benign   . Nonspecific reaction to tuberculin skin test without active tuberculosis(795.51)   . Nonspecific tuberculin test reaction   .  Other and unspecified hyperlipidemia   . Pain in joint, lower leg   . Peripheral arterial disease ([place])   . Postherpetic neuralgia   . Proteinuria   . Stroke ([place]) 01/2017  . Type II or unspecified type diabetes mellitus with renal manifestations, not stated as uncontrolled(250.40)   . Type II or unspecified type diabetes mellitus with renal manifestations, uncontrolled(250.42)   . Unspecified disorder of kidney and ureter   . Unspecified essential hypertension    Past Surgical History:  Procedure Laterality Date  .  hysterectomy    . INCISION AND DRAINAGE Left 05/27/14   sebacous cyst, ear  . removal of cyst from hand    . removal of tumor from foot    . TONSILLECTOMY     Social History:   reports that she has quit smoking. Her smoking use included Cigarettes. She has never used smokeless tobacco. She reports that she does not drink alcohol or use drugs.  Family History  Problem Relation Age of Onset  . Diabetes Mother   . Diabetes Father   . Diabetes Sister   . Diabetes Sister     Medications: Patient's Medications  New Prescriptions   No medications on file  Previous Medications   AMLODIPINE (NORVASC) 10 MG TABLET    TAKE ONE TABLET BY MOUTH DAILY FOR HIGH BLOOD PRESSURE   CARVEDILOL (COREG) 25 MG TABLET    TAKE 1 TABLET BY MOUTH TWICE A DAY WITH A MEAL   CLOPIDOGREL (PLAVIX) 75 MG TABLET    TAKE ONE TABLET BY MOUTH ONCE DAILY   GLUCOSE BLOOD (ONETOUCH VERIO) TEST STRIP    Use to test blood sugar three times daily. Dx: E11.9   INSULIN GLARGINE (LANTUS SOLOSTAR) 100 UNIT/ML SOLOSTAR PEN    Inject 50 Units into the skin daily.   INSULIN LISPRO (HUMALOG KWIKPEN) 100 UNIT/ML KIWKPEN    8 units breakfast, 12 units lunch & supper   INSULIN PEN NEEDLE (B-D ULTRAFINE III SHORT PEN) 31G X 8 MM MISC    Use to check blood sugar every day. Dx: 11.29; 11.65   LOVASTATIN (MEVACOR) 20 MG TABLET    TAKE 1 TABLET (20 MG TOTAL) BY MOUTH AT BEDTIME FOR CHOLESTEROL.   VALSARTAN-HYDROCHLOROTHIAZIDE (DIOVAN-HCT) 320-25 MG TABLET    TAKE ONE TABLET BY MOUTH ONCE DAILY  Modified Medications   No medications on file  Discontinued Medications   No medications on file     Physical Exam:  Vitals:   06/27/17 1319  BP: 132/80  Pulse: 76  Resp: 18  Temp: 97.7 F (36.5 C)  TempSrc: Oral  SpO2: 98%  Weight: 151 lb 6.4 oz (68.7 kg)  Height: 5\' 5"  (1.651 m)   Body mass index is 25.19 kg/m.  Physical Exam  Constitutional: She is oriented to person, place, and time. She appears well-developed and  well-nourished. No distress.  HENT:  Head: Normocephalic and atraumatic.  Eyes: Pupils are equal, round, and reactive to light.  Cardiovascular: Normal rate, regular rhythm and normal heart sounds.   Pulmonary/Chest: Effort normal and breath sounds normal. No respiratory distress.  Musculoskeletal: Normal range of motion.  Neurological: She is alert and oriented to person, place, and time.  Some short term memory loss   Skin: Skin is warm and dry.  Psychiatric: She has a normal mood and affect.    Labs reviewed: Basic Metabolic Panel:  Recent Labs  02/19/17 0349 04/07/17 0915 06/26/17 2252  NA 136 136 130*  K 3.5 4.0 3.5  CL 103 104 102  CO2 23 24 20*  GLUCOSE 149* 102* 209*  BUN 30* 25 25*  CREATININE 1.45* 1.52* 1.84*  CALCIUM 9.9 10.0 9.4   Liver Function Tests:  Recent Labs  01/06/17 0927 02/19/17 0349 04/07/17 0915  AST 14 15 17   ALT 7 10* 17  ALKPHOS 68 53 57  BILITOT 0.3 0.5 0.4  PROT 6.6 6.3* 6.7  ALBUMIN 3.7 3.3* 3.8   No results for input(s): LIPASE, AMYLASE in the last 8760 hours. No results for input(s): AMMONIA in the last 8760 hours. CBC:  Recent Labs  02/17/17 0915 02/18/17 1230 02/19/17 0349 06/26/17 2252  WBC 8.1  --  6.2 7.0  NEUTROABS 5.3  --   --   --   HGB 11.7*  --  10.9* 10.4*  HCT 34.1* 35.8 32.3* 30.3*  MCV 89.0  --  88.5 89.4  PLT 222  --  228 218   Lipid Panel:  Recent Labs  02/17/17 2355  CHOL 187  HDL 61  LDLCALC 102*  TRIG 121  CHOLHDL 3.1   TSH: No results for input(s): TSH in the last 8760 hours. A1C: Lab Results  Component Value Date   HGBA1C 7.3 (H) 04/07/2017     Assessment/Plan 1. Uncontrolled type 2 diabetes mellitus with stage 3 chronic kidney disease, with long-term current use of insulin (HCC) -blood sugars reviewed and they are variable from before when she stated her son changed the battery. Ranging from 86-380's. No other low blood sugars. Will send in new meter and strips to pharmacy.  Stressed importance of dietary compliance. To keep upcoming follow up with Tivis Ringer, pharm D   [place] Harle Battiest  [facility] [phone] 8 am - 5 pm) [phone] (after hours)

## 2017-06-27 NOTE — Patient Instructions (Signed)
Sending a new meter to your pharmacy Make sure you are eating properly   Diabetes Mellitus and Food It is important for you to manage your blood sugar (glucose) level. Your blood glucose level can be greatly affected by what you eat. Eating healthier foods in the appropriate amounts throughout the day at about the same time each day will help you control your blood glucose level. It can also help slow or prevent worsening of your diabetes mellitus. Healthy eating may even help you improve the level of your blood pressure and reach or maintain a healthy weight. General recommendations for healthful eating and cooking habits include:  Eating meals and snacks regularly. Avoid going long periods of time without eating to lose weight.  Eating a diet that consists mainly of plant-based foods, such as fruits, vegetables, nuts, legumes, and whole grains.  Using low-heat cooking methods, such as baking, instead of high-heat cooking methods, such as deep frying.  Work with your dietitian to make sure you understand how to use the Nutrition Facts information on food labels. How can food affect me? Carbohydrates Carbohydrates affect your blood glucose level more than any other type of food. Your dietitian will help you determine how many carbohydrates to eat at each meal and teach you how to count carbohydrates. Counting carbohydrates is important to keep your blood glucose at a healthy level, especially if you are using insulin or taking certain medicines for diabetes mellitus. Alcohol Alcohol can cause sudden decreases in blood glucose (hypoglycemia), especially if you use insulin or take certain medicines for diabetes mellitus. Hypoglycemia can be a life-threatening condition. Symptoms of hypoglycemia (sleepiness, dizziness, and disorientation) are similar to symptoms of having too much alcohol. If your health care provider has given you approval to drink alcohol, do so in moderation and use the following  guidelines:  Women should not have more than one drink per day, and men should not have more than two drinks per day. One drink is equal to: ? 12 oz of beer. ? 5 oz of wine. ? 1 oz of hard liquor.  Do not drink on an empty stomach.  Keep yourself hydrated. Have water, diet soda, or unsweetened iced tea.  Regular soda, juice, and other mixers might contain a lot of carbohydrates and should be counted.  What foods are not recommended? As you make food choices, it is important to remember that all foods are not the same. Some foods have fewer nutrients per serving than other foods, even though they might have the same number of calories or carbohydrates. It is difficult to get your body what it needs when you eat foods with fewer nutrients. Examples of foods that you should avoid that are high in calories and carbohydrates but low in nutrients include:  Trans fats (most processed foods list trans fats on the Nutrition Facts label).  Regular soda.  Juice.  Candy.  Sweets, such as cake, pie, doughnuts, and cookies.  Fried foods.  What foods can I eat? Eat nutrient-rich foods, which will nourish your body and keep you healthy. The food you should eat also will depend on several factors, including:  The calories you need.  The medicines you take.  Your weight.  Your blood glucose level.  Your blood pressure level.  Your cholesterol level.  You should eat a variety of foods, including:  Protein. ? Lean cuts of meat. ? Proteins low in saturated fats, such as fish, egg whites, and beans. Avoid processed meats.  Fruits and  vegetables. ? Fruits and vegetables that may help control blood glucose levels, such as apples, mangoes, and yams.  Dairy products. ? Choose fat-free or low-fat dairy products, such as milk, yogurt, and cheese.  Grains, bread, pasta, and rice. ? Choose whole grain products, such as multigrain bread, whole oats, and brown rice. These foods may help  control blood pressure.  Fats. ? Foods containing healthful fats, such as nuts, avocado, olive oil, canola oil, and fish.  Does everyone with diabetes mellitus have the same meal plan? Because every person with diabetes mellitus is different, there is not one meal plan that works for everyone. It is very important that you meet with a dietitian who will help you create a meal plan that is just right for you. This information is not intended to replace advice given to you by your health care provider. Make sure you discuss any questions you have with your health care provider. Document Released: 09/09/2005 Document Revised: 05/20/2016 Document Reviewed: 11/09/2013 Elsevier Interactive Patient Education  2017 [employer].

## 2017-06-27 NOTE — Discharge Instructions (Signed)
Have your creatinine (test of kidney function) checked in the next several weeks.   Change the battery in your home glucose monitor. Take it with you when you see your doctor - use it at the same time your doctor checks your blood sugar. The two readings should be very close if your monitor is working properly.

## 2017-07-01 ENCOUNTER — Other Ambulatory Visit: Payer: Self-pay | Admitting: Nurse Practitioner

## 2017-07-01 ENCOUNTER — Other Ambulatory Visit: Payer: Self-pay | Admitting: *Deleted

## 2017-07-01 ENCOUNTER — Telehealth: Payer: Self-pay

## 2017-07-01 DIAGNOSIS — E1122 Type 2 diabetes mellitus with diabetic chronic kidney disease: Secondary | ICD-10-CM

## 2017-07-01 DIAGNOSIS — N183 Chronic kidney disease, stage 3 (moderate): Principal | ICD-10-CM

## 2017-07-01 DIAGNOSIS — E1165 Type 2 diabetes mellitus with hyperglycemia: Secondary | ICD-10-CM

## 2017-07-01 DIAGNOSIS — IMO0002 Reserved for concepts with insufficient information to code with codable children: Secondary | ICD-10-CM

## 2017-07-01 DIAGNOSIS — Z0100 Encounter for examination of eyes and vision without abnormal findings: Secondary | ICD-10-CM

## 2017-07-01 DIAGNOSIS — Z794 Long term (current) use of insulin: Principal | ICD-10-CM

## 2017-07-01 MED ORDER — GLUCOSE BLOOD VI STRP: ORAL_STRIP | 3 refills | Status: DC

## 2017-07-01 NOTE — Telephone Encounter (Signed)
I called patient to confirm dose, patient confirmed 55 units

## 2017-07-01 NOTE — Telephone Encounter (Signed)
1.) Spoke with patient, patient would like to go to:  [facility] [address]  [phone] Dr. Tempie Hoist  Referral order placed   2.) Patient also questioned the status of podiatry referral placed a month ago. I informed patient that messages was left for her to return call regarding podiatry referral  Per Earlie Server patient can call [facility] @ [phone], patient informed.

## 2017-07-07 ENCOUNTER — Ambulatory Visit (INDEPENDENT_AMBULATORY_CARE_PROVIDER_SITE_OTHER): Payer: Medicare Other | Admitting: Nurse Practitioner

## 2017-07-07 ENCOUNTER — Other Ambulatory Visit: Payer: Medicare Other

## 2017-07-07 ENCOUNTER — Encounter: Payer: Self-pay | Admitting: Nurse Practitioner

## 2017-07-07 VITALS — BP 112/68 | HR 74 | Temp 98.2°F | Resp 17 | Ht 65.0 in | Wt 151.2 lb

## 2017-07-07 DIAGNOSIS — N183 Chronic kidney disease, stage 3 (moderate): Principal | ICD-10-CM

## 2017-07-07 DIAGNOSIS — E1165 Type 2 diabetes mellitus with hyperglycemia: Principal | ICD-10-CM

## 2017-07-07 DIAGNOSIS — E1122 Type 2 diabetes mellitus with diabetic chronic kidney disease: Secondary | ICD-10-CM

## 2017-07-07 DIAGNOSIS — IMO0002 Reserved for concepts with insufficient information to code with codable children: Secondary | ICD-10-CM

## 2017-07-07 DIAGNOSIS — Z794 Long term (current) use of insulin: Principal | ICD-10-CM

## 2017-07-07 NOTE — Progress Notes (Signed)
Careteam: Patient Care Team: [name], DO as PCP - General (Geriatric Medicine) Estanislado Emms, MD as Consulting Physician (Nephrology) Adrian Prows, MD as Consulting Physician (Cardiology)  Advanced Directive information Does Patient Have a Medical Advance Directive?: No  Allergies  Allergen Reactions  . Invokana [Canagliflozin] Other (See Comments)    Vagina itching and swelling Vaginal Itching and irritation   . Jardiance [Empagliflozin] Itching and Other (See Comments)    Vaginal itching and swelling    Chief Complaint  Patient presents with  . Acute Visit    Pt is being seen due to low blood sugar this morning. Pt took insulin without eating.   Tawni Pummel    Grandson in room      HPI: Patient is a 82 y.o. female seen in the office today to discuss blood sugar. Came to office today after taking insulin and not eating.  States her blood sugar was 35 then on recheck it was 71 (after OJ). Has high blood sugars as well.  Grandson with her today Pt is not eating correctly she takes her insulin and then does not eat.  Did not get her new meter because she thought it was too expensive however called pharmacy to verify and meter is completely covered by insurance.  States she normally does not take insulin and not eat but Yolanda Bonine states she has had many episodes of hypoglycemia due to skipped meals.  Does not really know what she is supposed to be eating despite education given.   Review of Systems:  By grandson and pt Review of Systems  Constitutional: Negative for chills, fever and malaise/fatigue.  Eyes: Negative for blurred vision.  Respiratory: Negative for shortness of breath.   Cardiovascular: Negative for chest pain and palpitations.  Gastrointestinal: Negative for abdominal pain.  Genitourinary: Negative for dysuria.  Musculoskeletal: Negative for falls.  Skin: Negative for itching and rash.  Neurological: Positive for tingling and sensory change. Negative  for weakness.  Endo/Heme/Allergies:       Hx of Diabetes with hypoglycemia issues from not eating States Blood sugars off since changing battery in meter however suspect she is down playing symptoms and meter is correct    Psychiatric/Behavioral: Positive for memory loss. Negative for depression.    Past Medical History:  Diagnosis Date  . Acute upper respiratory infections of unspecified site   . Anemia   . Anemia, unspecified   . Atherosclerosis of native arteries of the extremities, unspecified   . Chest pain, unspecified   . Chronic kidney disease (CKD), stage II (mild)   . Diabetes mellitus   . Diarrhea   . Disorder of bone and cartilage, unspecified   . DM (diabetes mellitus) type II controlled with renal manifestation ([place])   . Herpes zoster with other nervous system complications(053.19)   . Hypercalcemia   . Hypertension   . Hypertensive renal disease, benign   . Nonspecific reaction to tuberculin skin test without active tuberculosis(795.51)   . Nonspecific tuberculin test reaction   . Other and unspecified hyperlipidemia   . Pain in joint, lower leg   . Peripheral arterial disease ([place])   . Postherpetic neuralgia   . Proteinuria   . Stroke ([place]) 01/2017  . Type II or unspecified type diabetes mellitus with renal manifestations, not stated as uncontrolled(250.40)   . Type II or unspecified type diabetes mellitus with renal manifestations, uncontrolled(250.42)   . Unspecified disorder of kidney and ureter   . Unspecified essential hypertension  Past Surgical History:  Procedure Laterality Date  . hysterectomy    . INCISION AND DRAINAGE Left 05/27/14   sebacous cyst, ear  . removal of cyst from hand    . removal of tumor from foot    . TONSILLECTOMY     Social History:   reports that she has quit smoking. Her smoking use included Cigarettes. She has never used smokeless tobacco. She reports that she does not drink alcohol or use drugs.  Family History  Problem  Relation Age of Onset  . Diabetes Mother   . Diabetes Father   . Diabetes Sister   . Diabetes Sister     Medications: Patient's Medications  New Prescriptions   No medications on file  Previous Medications   AMLODIPINE (NORVASC) 10 MG TABLET    TAKE ONE TABLET BY MOUTH DAILY FOR HIGH BLOOD PRESSURE   BLOOD GLUCOSE MONITORING SUPPL (ONETOUCH VERIO IQ SYSTEM) W/DEVICE KIT    Use as directed to check blood glucose. Dx: E11.9   CARVEDILOL (COREG) 25 MG TABLET    TAKE 1 TABLET BY MOUTH TWICE A DAY WITH A MEAL   CLOPIDOGREL (PLAVIX) 75 MG TABLET    TAKE ONE TABLET BY MOUTH ONCE DAILY   GLUCOSE BLOOD (ONETOUCH VERIO) TEST STRIP    Use to test blood sugar three times daily. Dx: E11.9   INSULIN GLARGINE (LANTUS SOLOSTAR) 100 UNIT/ML SOLOSTAR PEN    Inject 50 units under the skin daily for Diabetes   INSULIN LISPRO (HUMALOG KWIKPEN) 100 UNIT/ML KIWKPEN    8 units breakfast, 12 units lunch & supper   INSULIN PEN NEEDLE (B-D ULTRAFINE III SHORT PEN) 31G X 8 MM MISC    Use to check blood sugar every day. Dx: 11.29; 11.65   LOVASTATIN (MEVACOR) 20 MG TABLET    TAKE 1 TABLET (20 MG TOTAL) BY MOUTH AT BEDTIME FOR CHOLESTEROL.   VALSARTAN-HYDROCHLOROTHIAZIDE (DIOVAN-HCT) 320-25 MG TABLET    TAKE ONE TABLET BY MOUTH ONCE DAILY  Modified Medications   No medications on file  Discontinued Medications   INSULIN GLARGINE (LANTUS SOLOSTAR) 100 UNIT/ML SOLOSTAR PEN    Inject 50 Units into the skin daily.     Physical Exam:  Vitals:   07/07/17 1111  BP: 112/68  Pulse: 74  Resp: 17  Temp: 98.2 F (36.8 C)  TempSrc: Oral  SpO2: 98%  Weight: 151 lb 3.2 oz (68.6 kg)  Height: '5\' 5"'  (1.651 m)   Body mass index is 25.16 kg/m.  Physical Exam  Constitutional: She is oriented to person, place, and time. She appears well-developed and well-nourished. No distress.  HENT:  Head: Normocephalic and atraumatic.  Eyes: Pupils are equal, round, and reactive to light.  Cardiovascular: Normal rate, regular  rhythm and normal heart sounds.   Pulmonary/Chest: Effort normal and breath sounds normal. No respiratory distress.  Musculoskeletal: Normal range of motion.  Neurological: She is alert and oriented to person, place, and time.  Some short term memory loss   Skin: Skin is warm and dry.  Psychiatric: She has a normal mood and affect.    Labs reviewed: Basic Metabolic Panel:  Recent Labs  02/19/17 0349 04/07/17 0915 06/26/17 2252  NA 136 136 130*  K 3.5 4.0 3.5  CL 103 104 102  CO2 23 24 20*  GLUCOSE 149* 102* 209*  BUN 30* 25 25*  CREATININE 1.45* 1.52* 1.84*  CALCIUM 9.9 10.0 9.4   Liver Function Tests:  Recent Labs  01/06/17 0927 02/19/17 0349  04/07/17 0915  AST '14 15 17  ' ALT 7 10* 17  ALKPHOS 68 53 57  BILITOT 0.3 0.5 0.4  PROT 6.6 6.3* 6.7  ALBUMIN 3.7 3.3* 3.8   No results for input(s): LIPASE, AMYLASE in the last 8760 hours. No results for input(s): AMMONIA in the last 8760 hours. CBC:  Recent Labs  02/17/17 0915 02/18/17 1230 02/19/17 0349 06/26/17 2252  WBC 8.1  --  6.2 7.0  NEUTROABS 5.3  --   --   --   HGB 11.7*  --  10.9* 10.4*  HCT 34.1* 35.8 32.3* 30.3*  MCV 89.0  --  88.5 89.4  PLT 222  --  228 218   Lipid Panel:  Recent Labs  02/17/17 2355  CHOL 187  HDL 61  LDLCALC 102*  TRIG 121  CHOLHDL 3.1   TSH: No results for input(s): TSH in the last 8760 hours. A1C: Lab Results  Component Value Date   HGBA1C 7.3 (H) 04/07/2017     Assessment/Plan 1. Uncontrolled type 2 diabetes mellitus with chronic kidney disease, unspecified CKD stage, unspecified whether long term insulin use ([place]) -discussed importance of eating while on insulin. Discussed not taking meal coverage without eating a meal. Discussed importance of taking blood sugars. -suspect pt is down playing symptoms around family and more support and reminders from family needed. Grandson agrees to get more involved. Discussed difference between simple sugars and complex  carbohydrates and proteins and how some quickly breakdown and others take longer. Referral to diabetic educator for further education on this.  Grandson at visit and educated on need to eat proper meals and proper insulin administration. He plans to attend diabetic education to help . -educated on severity of severe hypoglycemic which could involve seizure and possible death.  - Ambulatory referral to diabetic education on proper meals and eating habits.   To keep follow up with cathy miller pharm D on 07/18/17  [employer]. Harle Battiest  [facility] [phone] 8 am - 5 pm) [phone] (after hours)

## 2017-07-07 NOTE — Patient Instructions (Signed)
Diabetes Mellitus and Food It is important for you to manage your blood sugar (glucose) level. Your blood glucose level can be greatly affected by what you eat. Eating healthier foods in the appropriate amounts throughout the day at about the same time each day will help you control your blood glucose level. It can also help slow or prevent worsening of your diabetes mellitus. Healthy eating may even help you improve the level of your blood pressure and reach or maintain a healthy weight. General recommendations for healthful eating and cooking habits include:  Eating meals and snacks regularly. Avoid going long periods of time without eating to lose weight.  Eating a diet that consists mainly of plant-based foods, such as fruits, vegetables, nuts, legumes, and whole grains.  Using low-heat cooking methods, such as baking, instead of high-heat cooking methods, such as deep frying.  Work with your dietitian to make sure you understand how to use the Nutrition Facts information on food labels. How can food affect me? Carbohydrates Carbohydrates affect your blood glucose level more than any other type of food. Your dietitian will help you determine how many carbohydrates to eat at each meal and teach you how to count carbohydrates. Counting carbohydrates is important to keep your blood glucose at a healthy level, especially if you are using insulin or taking certain medicines for diabetes mellitus. Alcohol Alcohol can cause sudden decreases in blood glucose (hypoglycemia), especially if you use insulin or take certain medicines for diabetes mellitus. Hypoglycemia can be a life-threatening condition. Symptoms of hypoglycemia (sleepiness, dizziness, and disorientation) are similar to symptoms of having too much alcohol. If your health care provider has given you approval to drink alcohol, do so in moderation and use the following guidelines:  Women should not have more than one drink per day, and men  should not have more than two drinks per day. One drink is equal to: ? 12 oz of beer. ? 5 oz of wine. ? 1 oz of hard liquor.  Do not drink on an empty stomach.  Keep yourself hydrated. Have water, diet soda, or unsweetened iced tea.  Regular soda, juice, and other mixers might contain a lot of carbohydrates and should be counted.  What foods are not recommended? As you make food choices, it is important to remember that all foods are not the same. Some foods have fewer nutrients per serving than other foods, even though they might have the same number of calories or carbohydrates. It is difficult to get your body what it needs when you eat foods with fewer nutrients. Examples of foods that you should avoid that are high in calories and carbohydrates but low in nutrients include:  Trans fats (most processed foods list trans fats on the Nutrition Facts label).  Regular soda.  Juice.  Candy.  Sweets, such as cake, pie, doughnuts, and cookies.  Fried foods.  What foods can I eat? Eat nutrient-rich foods, which will nourish your body and keep you healthy. The food you should eat also will depend on several factors, including:  The calories you need.  The medicines you take.  Your weight.  Your blood glucose level.  Your blood pressure level.  Your cholesterol level.  You should eat a variety of foods, including:  Protein. ? Lean cuts of meat. ? Proteins low in saturated fats, such as fish, egg whites, and beans. Avoid processed meats.  Fruits and vegetables. ? Fruits and vegetables that may help control blood glucose levels, such as apples,   mangoes, and yams.  Dairy products. ? Choose fat-free or low-fat dairy products, such as milk, yogurt, and cheese.  Grains, bread, pasta, and rice. ? Choose whole grain products, such as multigrain bread, whole oats, and brown rice. These foods may help control blood pressure.  Fats. ? Foods containing healthful fats, such as  nuts, avocado, olive oil, canola oil, and fish.  Does everyone with diabetes mellitus have the same meal plan? Because every person with diabetes mellitus is different, there is not one meal plan that works for everyone. It is very important that you meet with a dietitian who will help you create a meal plan that is just right for you. This information is not intended to replace advice given to you by your health care provider. Make sure you discuss any questions you have with your health care provider. Document Released: 09/09/2005 Document Revised: 05/20/2016 Document Reviewed: 11/09/2013 Elsevier Interactive Patient Education  2017 Elsevier Inc.  

## 2017-07-11 ENCOUNTER — Other Ambulatory Visit: Payer: Medicare Other

## 2017-07-11 ENCOUNTER — Ambulatory Visit: Payer: Medicare Other | Admitting: Pharmacotherapy

## 2017-07-11 DIAGNOSIS — E1165 Type 2 diabetes mellitus with hyperglycemia: Principal | ICD-10-CM

## 2017-07-11 DIAGNOSIS — Z794 Long term (current) use of insulin: Principal | ICD-10-CM

## 2017-07-11 DIAGNOSIS — IMO0002 Reserved for concepts with insufficient information to code with codable children: Secondary | ICD-10-CM

## 2017-07-11 DIAGNOSIS — N183 Chronic kidney disease, stage 3 (moderate): Principal | ICD-10-CM

## 2017-07-11 DIAGNOSIS — E1122 Type 2 diabetes mellitus with diabetic chronic kidney disease: Secondary | ICD-10-CM

## 2017-07-11 LAB — COMPLETE METABOLIC PANEL WITH GFR
ALT: 17 U/L (ref 6–29)
AST: 16 U/L (ref 10–35)
Albumin: 3.7 g/dL (ref 3.6–5.1)
Alkaline Phosphatase: 72 U/L (ref 33–130)
BUN: 30 mg/dL — ABNORMAL HIGH (ref 7–25)
CO2: 23 mmol/L (ref 20–31)
Calcium: 9.8 mg/dL (ref 8.6–10.4)
Chloride: 106 mmol/L (ref 98–110)
Creat: 1.65 mg/dL — ABNORMAL HIGH (ref 0.60–0.88)
GFR, Est African American: 34 mL/min — ABNORMAL LOW (ref >=60)
GFR, Est Non African American: 29 mL/min — ABNORMAL LOW (ref >=60)
Glucose, Bld: 167 mg/dL — ABNORMAL HIGH (ref 65–99)
Potassium: 4.1 mmol/L (ref 3.5–5.3)
Sodium: 138 mmol/L (ref 135–146)
Total Bilirubin: 0.4 mg/dL (ref 0.2–1.2)
Total Protein: 6.5 g/dL (ref 6.1–8.1)

## 2017-07-12 ENCOUNTER — Encounter: Payer: Self-pay | Admitting: Podiatry

## 2017-07-12 ENCOUNTER — Ambulatory Visit (INDEPENDENT_AMBULATORY_CARE_PROVIDER_SITE_OTHER): Payer: Medicare Other | Admitting: Podiatry

## 2017-07-12 VITALS — BP 124/50 | HR 61

## 2017-07-12 DIAGNOSIS — E1142 Type 2 diabetes mellitus with diabetic polyneuropathy: Secondary | ICD-10-CM

## 2017-07-12 DIAGNOSIS — B351 Tinea unguium: Secondary | ICD-10-CM | POA: Diagnosis not present

## 2017-07-12 DIAGNOSIS — E1151 Type 2 diabetes mellitus with diabetic peripheral angiopathy without gangrene: Secondary | ICD-10-CM

## 2017-07-12 LAB — HEMOGLOBIN A1C
Hgb A1c MFr Bld: 7.6 % — ABNORMAL HIGH (ref <5.7)
Mean Plasma Glucose: 171 mg/dL

## 2017-07-12 NOTE — Patient Instructions (Signed)

## 2017-07-12 NOTE — Progress Notes (Signed)
   Subjective:    Patient ID: Monique Cabrera, female    DOB: 1935-05-21, 82 y.o.   MRN: [id number]  HPI This patient presents today complaining of thickened and elongated toenails which she says are cough walking wearing shoes and she is requesting nail debridement. Patient was last seen for a similar service on 02/24/2016. Patient has attempted trim her nails herself, however, was unable to do so Patient is a diabetic with history of claudication Patient has history of hepatitis C Patient is a former smoker   Review of Systems  All other systems reviewed and are negative.      Objective:   Physical Exam  Orientated 3 DP and PT pulses 0/4 bilaterally Capillary reflex delay bilaterally Sensation to 10 g monofilament wire intact 2/9 bilaterally Vibratory sensation reactive bilaterally Ankle reflex reactive bilaterally No open skin lesions bilaterally Atrophic skin with absent hair growth bilaterally Toenails elongated, brittle, deformed, discolored 6-10 Low-grade edema third left toe associated with history of trauma 4+ months ago HAV bilaterally Manual motor testing dorsi flexion, plantar flexion 5/5 bilaterally      Assessment & Plan:   Assessment: Diabetic with peripheral neuropathy and peripheral arterial disease Mycotic toenails 6-10  Plan: Debridement toenails 6-10 mechanically and electronically without any bleeding  Reappoint 3 months

## 2017-07-13 DIAGNOSIS — E161 Other hypoglycemia: Secondary | ICD-10-CM | POA: Diagnosis not present

## 2017-07-13 DIAGNOSIS — R739 Hyperglycemia, unspecified: Secondary | ICD-10-CM | POA: Diagnosis not present

## 2017-07-18 ENCOUNTER — Ambulatory Visit (INDEPENDENT_AMBULATORY_CARE_PROVIDER_SITE_OTHER): Payer: Medicare Other | Admitting: Pharmacotherapy

## 2017-07-18 ENCOUNTER — Encounter: Payer: Self-pay | Admitting: Pharmacotherapy

## 2017-07-18 VITALS — BP 140/70 | HR 65 | Temp 98.8°F | Ht 65.0 in | Wt 152.0 lb

## 2017-07-18 DIAGNOSIS — I10 Essential (primary) hypertension: Secondary | ICD-10-CM

## 2017-07-18 DIAGNOSIS — N183 Chronic kidney disease, stage 3 (moderate): Secondary | ICD-10-CM

## 2017-07-18 DIAGNOSIS — E1165 Type 2 diabetes mellitus with hyperglycemia: Secondary | ICD-10-CM

## 2017-07-18 DIAGNOSIS — IMO0002 Reserved for concepts with insufficient information to code with codable children: Secondary | ICD-10-CM

## 2017-07-18 DIAGNOSIS — Z794 Long term (current) use of insulin: Secondary | ICD-10-CM

## 2017-07-18 DIAGNOSIS — E1122 Type 2 diabetes mellitus with diabetic chronic kidney disease: Secondary | ICD-10-CM | POA: Diagnosis not present

## 2017-07-18 MED ORDER — VALSARTAN-HYDROCHLOROTHIAZIDE 320-25 MG PO TABS: 1.0000 | ORAL_TABLET | Freq: Every day | ORAL | 2 refills | Status: DC

## 2017-07-18 MED ORDER — INSULIN LISPRO 100 UNIT/ML (KWIKPEN): PEN_INJECTOR | SUBCUTANEOUS | 1 refills | Status: DC

## 2017-07-18 MED ORDER — INSULIN GLARGINE 100 UNIT/ML SOLOSTAR PEN: PEN_INJECTOR | SUBCUTANEOUS | 1 refills | Status: DC

## 2017-07-18 NOTE — Patient Instructions (Signed)
Start taking Humalog 6 units with each meal.  Need to eat 3 meals per day.  Need to find an activity that keeps you happy

## 2017-07-18 NOTE — Progress Notes (Signed)
  Subjective:    Monique Cabrera is a 82 y.o.African American female who presents for follow-up of Type 2 diabetes mellitus.   A1C: 7.6% (up from 7.3%)  She says she has been having "drops" in blood sugar without warning.  Frequently calling EMS. She says she is eating when she takes her insulin. Lantus 50 units every morning. Humalog 8 units with breakfast.  Often doesn't take her lunch & supper blood glucose.  Average BG: 127m/dl - has only SMBG 18 times in 90 days. No hypoglycemia in her meter.  However, she says EMS told her the BG was as low as 258mdl.  Struggles with healthy eating habits.  Eats smaller portions. Drinks more juice than water. Not much exercise due to leg pain. No peripheral edema. Numbness in legs and feet. She "feels nasty  Inside"  Cannot be specific with her symptoms.  Denies abdominal pain, headache, dizziness, nausea. Nocturia once per night No dysuria She is having low back pain. Denies new problems with vision.  Review of Systems A comprehensive review of systems was negative except for: Constitutional: positive for fatigue Eyes: positive for contacts/glasses Genitourinary: positive for nocturia Musculoskeletal: positive for arthralgias and back pain Neurological: positive for weakness    Objective:    BP 140/70   Pulse 65   Temp 98.8 F (37.1 C) (Oral)   Ht 5' 5"$  (1.651 m)   Wt 152 lb (68.9 kg)   SpO2 98%   BMI 25.29 kg/m   General:  alert, cooperative and no distress  Oropharynx: normal findings: lips normal without lesions and gums healthy   Eyes:  negative findings: lids and lashes normal and conjunctivae and sclerae normal   Ears:  external ears normal        Lung: clear to auscultation bilaterally  Heart:  regular rate and rhythm     Extremities: extremities normal, atraumatic, no cyanosis or edema  Skin: warm and dry, no hyperpigmentation, vitiligo, or suspicious lesions     Neuro: mental status, speech normal, alert and  oriented x3 and gait and station normal   Lab Review Glucose, Bld (mg/dL)  Date Value  07/11/2017 167 (H)  06/26/2017 209 (H)  04/07/2017 102 (H)   CO2 (mmol/L)  Date Value  07/11/2017 23  06/26/2017 20 (L)  04/07/2017 24   BUN (mg/dL)  Date Value  07/11/2017 30 (H)  06/26/2017 25 (H)  04/07/2017 25  06/11/2016 21  04/14/2016 16  01/19/2016 16   Creat (mg/dL)  Date Value  07/11/2017 1.65 (H)  04/07/2017 1.52 (H)  01/06/2017 1.49 (H)   Creatinine, Ser (mg/dL)  Date Value  06/26/2017 1.84 (H)  02/19/2017 1.45 (H)  02/17/2017 1.44 (H)       Assessment:    Diabetes Mellitus type II, under good control.  A1C up a little due to not taking insulin as prescribed. BP above target <130/80   Plan:    1.  Rx changes: Humalog 6 units with each meal.  2.  Continue Lantus 50 units daily 3.  Counseled on importance of taking insulin as prescribed. 4.  Counseled on hypoglycemia 5.  Counseled on nutrition goals. 6.  Counseled on importance of routine exercise.  Goal is 30-45 minutes 5 x week. 7.  BP above goal <130/80 - possibly due to needing refills on her medication.  BP usually OK.  Will continue current RX and monitor.

## 2017-07-20 ENCOUNTER — Encounter: Payer: Self-pay | Admitting: Nurse Practitioner

## 2017-07-20 ENCOUNTER — Ambulatory Visit (INDEPENDENT_AMBULATORY_CARE_PROVIDER_SITE_OTHER): Payer: Medicare Other | Admitting: Nurse Practitioner

## 2017-07-20 VITALS — BP 130/80 | HR 69 | Wt 149.6 lb

## 2017-07-20 DIAGNOSIS — E785 Hyperlipidemia, unspecified: Secondary | ICD-10-CM | POA: Diagnosis not present

## 2017-07-20 DIAGNOSIS — R269 Unspecified abnormalities of gait and mobility: Secondary | ICD-10-CM

## 2017-07-20 DIAGNOSIS — I10 Essential (primary) hypertension: Secondary | ICD-10-CM

## 2017-07-20 DIAGNOSIS — I639 Cerebral infarction, unspecified: Secondary | ICD-10-CM | POA: Diagnosis not present

## 2017-07-20 NOTE — Progress Notes (Addendum)
[facility]  PATIENT: Monique Cabrera DOB: [birth year]   REASON FOR VISIT: follow up for stroke Feb 2018 HISTORY FROM:patient     HISTORY OF PRESENT ILLNESS: UPDATE 07/20/17 CM [name], 82 year old female returns for follow-up with history of stroke event in February 2018. She has risk factors of age hyperlipidemia hypertension and diabetes and chronic kidney disease. She is currently on Plavix for secondary stroke prevention without further stroke TIA symptoms. She has no bruising and bleeding. She remains on lovastatin for cholesterol without myalgias she is still having fluctuations in her blood sugars are most recent hemoglobin A1c 7.6 on 07/11/2017. She has had adjustments to her insulin. Blood pressure in the office today 130/80. She is back to baseline from a stroke standpoint. 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 She returns for reevaluation   HISTORY 04/19/17 CMMs. Monique Cabrera, 82 year old female returns for hospital follow-up after having a stroke. She has a history of hypertension diabetes and hyperlipidemia chronic kidney disease anemia peripheral vascular disease and she is legally blind in her left eye. She was admitted on 02/23/2017 for vision changes dizziness and being off balance she was already taking aspirin 81 and Plavix for her peripheral vascular disease. MRI of the brain acute infarct along the floor of the upper fourth ventricle. CTA head and neck high-grade stenosis of the left anterior M2  segment diffuse atherosclerosis. 2-D echo 60-65% EF no cardiac source of emboli identified. LDL 102 hemoglobin A1c 8.4. It was recommended that she be on aspirin 325 Plavix for 3 months then Plavix alone due to her intracranial stenosis. She returns today for reevaluation in the stroke. She has not had further stroke or TIA symptoms. She has no bruising and no bleeding from Plavix and aspirin. She is now on lovastatin for  hyperlipidemia without myalgias. Blood pressure in the office today 138/71. She is trying to exercise some. Her therapies have concluded. She is legally blind in the left eye. She returns for reevaluation. Patient no longer drives    REVIEW OF SYSTEMS: Full 14 system review of systems performed and notable only for those listed, all others are neg:  Constitutional: neg  Cardiovascular: neg Ear/Nose/Throat: neg  Skin: neg Eyes: Blurred vision Respiratory: neg Gastroitestinal: neg  Hematology/Lymphatic: neg  Endocrine: neg Musculoskeletal:neg Allergy/Immunology: neg Neurological: neg Psychiatric: neg Sleep : neg   ALLERGIES: Allergies  Allergen Reactions  . Invokana [Canagliflozin] Other (See Comments)    Vagina itching and swelling Vaginal Itching and irritation   . Jardiance [Empagliflozin] Itching and Other (See Comments)    Vaginal itching and swelling    HOME MEDICATIONS: Outpatient Medications Prior to Visit  Medication Sig Dispense Refill  . amLODipine (NORVASC) 10 MG tablet TAKE ONE TABLET BY MOUTH DAILY FOR HIGH BLOOD PRESSURE 90 tablet 1  . Blood Glucose Monitoring Suppl (ONETOUCH VERIO IQ SYSTEM) w/Device KIT Use as directed to check blood glucose. Dx: E11.9 1 kit 0  . carvedilol (COREG) 25 MG tablet TAKE 1 TABLET BY MOUTH TWICE A DAY WITH A MEAL 180 tablet 1  . clopidogrel (PLAVIX) 75 MG tablet TAKE ONE TABLET BY MOUTH ONCE DAILY 90 tablet 2  . glucose blood (ONETOUCH VERIO) test strip Use to test blood sugar three times daily. Dx: E11.9 300 each 3  . Insulin Glargine (LANTUS SOLOSTAR) 100 UNIT/ML Solostar Pen Inject 50 units under the skin daily for Diabetes 15 pen 1  . insulin lispro (HUMALOG KWIKPEN)  100 UNIT/ML KiwkPen 6 units breakfast, 6 units lunch & supper 5 pen 1  . Insulin Pen Needle (B-D ULTRAFINE III SHORT PEN) 31G X 8 MM MISC Use to check blood sugar every day. Dx: 11.29; 11.65 100 each 1  . lovastatin (MEVACOR) 20 MG tablet TAKE 1 TABLET (20 MG TOTAL)  BY MOUTH AT BEDTIME FOR CHOLESTEROL. 90 tablet 0  . valsartan-hydrochlorothiazide (DIOVAN-HCT) 320-25 MG tablet Take 1 tablet by mouth daily. 90 tablet 2   No facility-administered medications prior to visit.     PAST MEDICAL HISTORY: Past Medical History:  Diagnosis Date  . Acute upper respiratory infections of unspecified site   . Anemia   . Anemia, unspecified   . Atherosclerosis of native arteries of the extremities, unspecified   . Chest pain, unspecified   . Chronic kidney disease (CKD), stage II (mild)   . Diabetes mellitus   . Diarrhea   . Disorder of bone and cartilage, unspecified   . DM (diabetes mellitus) type II controlled with renal manifestation ([place])   . Herpes zoster with other nervous system complications(053.19)   . Hypercalcemia   . Hypertension   . Hypertensive renal disease, benign   . Nonspecific reaction to tuberculin skin test without active tuberculosis(795.51)   . Nonspecific tuberculin test reaction   . Other and unspecified hyperlipidemia   . Pain in joint, lower leg   . Peripheral arterial disease ([place])   . Postherpetic neuralgia   . Proteinuria   . Stroke ([place]) 01/2017  . Type II or unspecified type diabetes mellitus with renal manifestations, not stated as uncontrolled(250.40)   . Type II or unspecified type diabetes mellitus with renal manifestations, uncontrolled(250.42)   . Unspecified disorder of kidney and ureter   . Unspecified essential hypertension     PAST SURGICAL HISTORY: Past Surgical History:  Procedure Laterality Date  . hysterectomy    . INCISION AND DRAINAGE Left 05/27/14   sebacous cyst, ear  . removal of cyst from hand    . removal of tumor from foot    . TONSILLECTOMY      FAMILY HISTORY: Family History  Problem Relation Age of Onset  . Diabetes Mother   . Diabetes Father   . Diabetes Sister   . Diabetes Sister     SOCIAL HISTORY: Social History   Social History  . Marital status: Widowed    Spouse name:  N/A  . Number of children: 6  . Years of education: 22   Occupational History  .      retired, [employer]   Social History Main Topics  . Smoking status: Former Smoker    Types: Cigarettes  . Smokeless tobacco: Never Used     Comment: Quit about age 87   . Alcohol use No  . Drug use: No  . Sexual activity: No   Other Topics Concern  . Not on file   Social History Narrative   Lives alone, son there occass   Caffeine- coffee 2-3 cups daily, soda off and on     PHYSICAL EXAM  Vitals:   07/20/17 0901  BP: (!) 130/80   Pulse: 69  Weight: 149 lb 9.6 oz (67.9 kg)   Body mass index is 24.89 kg/m.  Generalized: Well developed, in no acute distress  Head: normocephalic and atraumatic,. Oropharynx benign  Neck: Supple, no carotid bruits  Cardiac: Regular rate rhythm, no murmur  Musculoskeletal: No deformity   Neurological examination   Mentation: Alert oriented to time, place,  history taking. Attention span and concentration appropriate. Recent and remote memory intact.  Follows all commands speech and language fluent.   Cranial nerve II-XII: Extraocular movements were full Except left adduction impaired, visual field were full on confrontational test. Facial sensation and strength were normal. hearing was intact to finger rubbing bilaterally. Uvula tongue midline. head turning and shoulder shrug were normal and symmetric.Tongue protrusion into cheek strength was normal. Motor: normal bulk and tone, full strength in the BUE, BLE, fine finger movements normal, no pronator drift. No focal weakness Sensory: normal and symmetric to light touch, pinprick, and  Vibration, in the upper and lower extremities Coordination: finger-nose-finger, heel-to-shin bilaterally, no dysmetria Reflexes: 1+ upper lower and symmetric plantar responses were flexor bilaterally. Gait and Station: Rising up from seated position without assistance, normal stance,  moderate stride, good arm swing, smooth  turning, able to perform tiptoe, and heel walking without difficulty. Tandem gait is mildly unsteady. No assistive device  DIAGNOSTIC DATA (LABS, IMAGING, TESTING) - I reviewed patient records, labs, notes, testing and imaging myself where available.  Lab Results  Component Value Date   WBC 7.0 06/26/2017   HGB 10.4 (L) 06/26/2017   HCT 30.3 (L) 06/26/2017   MCV 89.4 06/26/2017   PLT 218 06/26/2017      Component Value Date/Time   NA 138 07/11/2017 0831   NA 136 06/11/2016 1025   K 4.1 07/11/2017 0831   CL 106 07/11/2017 0831   CO2 23 07/11/2017 0831   GLUCOSE 167 (H) 07/11/2017 0831   BUN 30 (H) 07/11/2017 0831   BUN 21 06/11/2016 1025   CREATININE 1.65 (H) 07/11/2017 0831   CALCIUM 9.8 07/11/2017 0831   PROT 6.5 07/11/2017 0831   PROT 7.1 04/14/2016 0922   ALBUMIN 3.7 07/11/2017 0831   ALBUMIN 4.1 04/14/2016 0922   AST 16 07/11/2017 0831   ALT 17 07/11/2017 0831   ALKPHOS 72 07/11/2017 0831   BILITOT 0.4 07/11/2017 0831   BILITOT 0.4 04/14/2016 0922   GFRNONAA 29 (L) 07/11/2017 0831   GFRAA 34 (L) 07/11/2017 0831   Lab Results  Component Value Date   CHOL 187 02/17/2017   HDL 61 02/17/2017   LDLCALC 102 (H) 02/17/2017   TRIG 121 02/17/2017   CHOLHDL 3.1 02/17/2017   Lab Results  Component Value Date   HGBA1C 7.6 (H) 07/11/2017   Lab Results  Component Value Date   VITAMINB12 274 02/18/2017   ASSESSMENT AND PLAN  82 y.o. year old female  has a past medical history of  Chronic kidney disease (CKD), stage II (mild);  DM (diabetes mellitus) type II controlled with renal manifestation ([place]); Hypertension; Hypertensive renal disease, benign;  Peripheral arterial disease ([place]);  Stroke [facility]) (01/2017);  here for stroke follow up. The patient is a current patient of Dr. Erlinda Hong  who is out of the office today . This note is sent to the work in doctor.      PLAN: Stressed the importance of management of risk factors to prevent further stroke Continue Plavix for  secondary stroke prevention,   Maintain strict control of hypertension with blood pressure goal below 130/90, today's reading 130/80 continue antihypertensive medications Control of diabetes with hemoglobin A1c below 6.5 followed by primary care most recent hemoglobin A1c 7.6 on 07/11/17   continue diabetic medications and close followup  Cholesterol with LDL cholesterol less than 70, followed by primary care,   continue Lovastatin Exercise by walking, 30 min daily eat healthy diet with whole grains  follow diabetic diet,  fresh fruits and vegetables Will discharge I spent 25 minutes in total face to face time with the patient more than 50% of which was spent counseling and coordination of care, reviewing test results reviewing medications and discussing and reviewing the diagnosis of stroke and management of risk factors. Written information given Dennie Bible, [name], [facility], APRN  [facility] [address], [place], [place] [address] [phone]  I reviewed the above note and documentation by the Nurse Practitioner and agree with the history, physical exam, assessment and plan as outlined above. I was immediately available for face-to-face consultation. Star Age, MD, PhD [facility])

## 2017-07-20 NOTE — Patient Instructions (Addendum)
Stressed the importance of management of risk factors to prevent further stroke Continue Plavix for secondary stroke prevention,   Maintain strict control of hypertension with blood pressure goal below 130/90, today's reading 130/80 continue antihypertensive medications Control of diabetes with hemoglobin A1c below 6.5 followed by primary care most recent hemoglobin A1c 7.6 on 07/11/17   continue diabetic medications and close followup  Cholesterol with LDL cholesterol less than 70, followed by primary care,   continue Lovastatin Exercise by walking,  eat healthy diet with whole grains follow diabetic diet,  fresh fruits and vegetables Will discharge  Stroke Prevention Some health problems and behaviors may make it more likely for you to have a stroke. Below are ways to lessen your risk of having a stroke.  Be active for at least 30 minutes on most or all days.  Do not smoke. Try not to be around others who smoke.  Do not drink too much alcohol. ? Do not have more than 2 drinks a day if you are a man. ? Do not have more than 1 drink a day if you are a woman and are not pregnant.  Eat healthy foods, such as fruits and vegetables. If you were put on a specific diet, follow the diet as told.  Keep your cholesterol levels under control through diet and medicines. Look for foods that are low in saturated fat, trans fat, cholesterol, and are high in fiber.  If you have diabetes, follow all diet plans and take your medicine as told.  Ask your doctor if you need treatment to lower your blood pressure. If you have high blood pressure (hypertension), follow all diet plans and take your medicine as told by your doctor.  If you are 54-34 years old, have your blood pressure checked every 3-5 years. If you are age 54 or older, have your blood pressure checked every year.  Keep a healthy weight. Eat foods that are low in calories, salt, saturated fat, trans fat, and cholesterol.  Do not take  drugs.  Avoid birth control pills, if this applies. Talk to your doctor about the risks of taking birth control pills.  Talk to your doctor if you have sleep problems (sleep apnea).  Take all medicine as told by your doctor. ? You may be told to take aspirin or blood thinner medicine. Take this medicine as told by your doctor. ? Understand your medicine instructions.  Make sure any other conditions you have are being taken care of.  Get help right away if:  You suddenly lose feeling (you feel numb) or have weakness in your face, arm, or leg.  Your face or eyelid hangs down to one side.  You suddenly feel confused.  You have trouble talking (aphasia) or understanding what people are saying.  You suddenly have trouble seeing in one or both eyes.  You suddenly have trouble walking.  You are dizzy.  You lose your balance or your movements are clumsy (uncoordinated).  You suddenly have a very bad headache and you do not know the cause.  You have new chest pain.  Your heart feels like it is fluttering or skipping a beat (irregular heartbeat). Do not wait to see if the symptoms above go away. Get help right away. Call your local emergency services (911 in [place] Do not drive yourself to the hospital. This information is not intended to replace advice given to you by your health care provider. Make sure you discuss any questions you have with your  health care provider. Document Released: 06/13/2012 Document Revised: 05/20/2016 Document Reviewed: 06/15/2013 Elsevier Interactive Patient Education  [employer].

## 2017-07-21 DIAGNOSIS — I639 Cerebral infarction, unspecified: Secondary | ICD-10-CM | POA: Diagnosis not present

## 2017-07-21 DIAGNOSIS — I10 Essential (primary) hypertension: Secondary | ICD-10-CM | POA: Diagnosis not present

## 2017-07-21 DIAGNOSIS — E1121 Type 2 diabetes mellitus with diabetic nephropathy: Secondary | ICD-10-CM | POA: Diagnosis not present

## 2017-07-21 DIAGNOSIS — N183 Chronic kidney disease, stage 3 (moderate): Secondary | ICD-10-CM | POA: Diagnosis not present

## 2017-07-25 ENCOUNTER — Encounter: Payer: Self-pay | Admitting: Internal Medicine

## 2017-07-27 ENCOUNTER — Other Ambulatory Visit: Payer: Self-pay | Admitting: Internal Medicine

## 2017-07-27 ENCOUNTER — Other Ambulatory Visit: Payer: Self-pay | Admitting: Nurse Practitioner

## 2017-07-27 MED ORDER — INSULIN GLARGINE 100 UNIT/ML SOLOSTAR PEN: PEN_INJECTOR | SUBCUTANEOUS | 3 refills | Status: DC

## 2017-08-18 ENCOUNTER — Encounter: Payer: Self-pay | Admitting: Registered"

## 2017-08-18 ENCOUNTER — Encounter: Payer: Medicare Other | Attending: Internal Medicine | Admitting: Registered"

## 2017-08-18 DIAGNOSIS — E119 Type 2 diabetes mellitus without complications: Secondary | ICD-10-CM

## 2017-08-18 DIAGNOSIS — E1122 Type 2 diabetes mellitus with diabetic chronic kidney disease: Secondary | ICD-10-CM | POA: Insufficient documentation

## 2017-08-18 DIAGNOSIS — Z713 Dietary counseling and surveillance: Secondary | ICD-10-CM | POA: Diagnosis not present

## 2017-08-18 DIAGNOSIS — E1165 Type 2 diabetes mellitus with hyperglycemia: Secondary | ICD-10-CM | POA: Diagnosis not present

## 2017-08-18 NOTE — Progress Notes (Signed)
Diabetes Self-Management Education  Visit Type: First/Initial  Appt. Start Time: 1115 Appt. End Time: 1215  08/18/2017  [name], identified by name and date of birth, is a 82 y.o. female with a diagnosis of Diabetes: Type 2.   ASSESSMENT Pt states she has had several hypoglycemic episodes where she described delirious symptoms. Pt states she didn't know that taking insulin without eating would cause a problem until recently. Pt reports checking blood sugar often now, every time she feels funny, but since her doctor reduced her insulin medication and she has been more diligent about eating regularly, she has not had very low BG readings as before.  Pt states she had a TIA in February and has recovered well except balance is affected but she did not report any falls.  Pt states she is not sleeping as well as she used to. RD discussed role of sleep in BG control as well as overall health and strategies to improve.     Diabetes Self-Management Education - 08/18/17 1119      Visit Information   Visit Type First/Initial     Initial Visit   Diabetes Type Type 2   Are you currently following a meal plan? Yes   What type of meal plan do you follow? 3 complete meals day    Are you taking your medications as prescribed? Yes   Date Diagnosed 56 (maybe)     Health Coping   How would you rate your overall health? Good     Psychosocial Assessment   Patient Belief/Attitude about Diabetes Motivated to manage diabetes   How often do you need to have someone help you when you read instructions, pamphlets, or other written materials from your doctor or pharmacy? 1 - Never   What is the last grade level you completed in school? college     Complications   Last HgB A1C per patient/outside source 7.3 %  july 2018   How often do you check your blood sugar? 3-4 times/day   Fasting Blood glucose range (mg/dL) 130-179  150-160   Number of hypoglycemic episodes per month 3   Can you  tell when your blood sugar is low? Yes   What do you do if your blood sugar is low? OJ and crackers   Number of hyperglycemic episodes per week 1   Can you tell when your blood sugar is high? No   Have you had a dilated eye exam in the past 12 months? No  looking for a new optometrist   Have you had a dental exam in the past 12 months? No   Are you checking your feet? Yes   How many days per week are you checking your feet? 2     Dietary Intake   Breakfast bacon eggs, grits, OR oatmeal, toast meat OR liver pudding   Snack (morning) pb crackers    Lunch Turnip greens in the can, OR soup cream of chicken, sandwich OR salad   Snack (afternoon) snack bars    Dinner Chicken, potatoes, broccoli   Snack (evening) diet coke    Beverage(s) diet coke, coffee      Exercise   How many days per week to you exercise? 3   How many minutes per day do you exercise? 15   Total minutes per week of exercise 45     Patient Education   Previous Diabetes Education No   Nutrition management  Role of diet in the treatment of diabetes and  the relationship between the three main macronutrients and blood glucose level;Food label reading, portion sizes and measuring food.   Monitoring Identified appropriate SMBG and/or A1C goals.     Individualized Goals (developed by patient)   Nutrition General guidelines for healthy choices and portions discussed   Monitoring  test my blood glucose as discussed     Outcomes   Expected Outcomes Demonstrated interest in learning. Expect positive outcomes   Future DMSE PRN   Program Status Completed    Individualized Plan for Diabetes Self-Management Training:   Learning Objective:  Patient will have a greater understanding of diabetes self-management. Patient education plan is to attend individual and/or group sessions per assessed needs and concerns.   Patient Instructions   To help with sleep consider not turning on bright lights in the bathroom.  Consider  choosing caffienne free, diet soda in the evening. Ask your doctor about dark soda.  Look into [employer] Continuous Glucose Monitoring system.  Continue eating balanced meals  Aim to balance your snacks with protein; bananas and pB, grapes and cheese.  Continue eating nutrient dense foods like beans, greens and fish regularly  Expected Outcomes:  Demonstrated interest in learning. Expect positive outcomes  Education material provided: Carb sheet, Snack sheet, My Plate, Freestyle libre brochure  If problems or questions, patient to contact team via:  Phone  Future DSME appointment: PRN

## 2017-08-18 NOTE — Patient Instructions (Addendum)
   To help with sleep consider not turning on bright lights in the bathroom.  Consider choosing caffienne free, diet soda in the evening. Ask your doctor about dark soda.  Look into Fre[employer] Continuous Glucose Monitoring system.  Continue eating balanced meals  Aim to balance your snacks with protein; bananas and pB, grapes and cheese.  Continue eating nutrient dense foods like beans, greens and fish regularly

## 2017-08-22 ENCOUNTER — Ambulatory Visit: Payer: Medicare Other

## 2017-09-02 ENCOUNTER — Other Ambulatory Visit: Payer: Self-pay

## 2017-09-02 NOTE — Patient Outreach (Signed)
[place] [name]) Care Management  09/02/2017  GERALENE AFSHAR 14-Jan-1935 [id number]   Medication Adherence call to Mrs. Hedaya Latendresse the reason for this call is because Mrs. Wiens is showing past due under [employer] [place] [employer]on her Lovastatin 20 mg spoke to Mrs. [place]ita she said she still has some medication that she always call her medication when she is running low on her bottle because the pharmacy always fill the prescription ahead of time she does not need any at this time.    [place] Management Direct Dial [phone]  Fax [phone] [email or web address]

## 2017-09-05 DIAGNOSIS — I10 Essential (primary) hypertension: Secondary | ICD-10-CM | POA: Diagnosis not present

## 2017-09-05 DIAGNOSIS — I639 Cerebral infarction, unspecified: Secondary | ICD-10-CM | POA: Diagnosis not present

## 2017-09-05 DIAGNOSIS — N183 Chronic kidney disease, stage 3 (moderate): Secondary | ICD-10-CM | POA: Diagnosis not present

## 2017-09-05 DIAGNOSIS — E1121 Type 2 diabetes mellitus with diabetic nephropathy: Secondary | ICD-10-CM | POA: Diagnosis not present

## 2017-09-06 ENCOUNTER — Other Ambulatory Visit: Payer: Self-pay | Admitting: *Deleted

## 2017-09-06 NOTE — Patient Outreach (Signed)
[place] [facility]) Care Management  09/06/2017  Monique Cabrera [birth year] [id number]  Referral via Patient Engagement Tool-DX Diabetes, HTN:  Telephone call #1 to patient.  Patient advised of reason for call & of [name] care management services. HIPPA verification received from patient. Patient agreed to screening assessment.    States major health concerns are Diabetes & Blood pressure. States she has had DM over 15 years & is learning things about it that she never knew.  Voices that she does own meter & checks her blood sugar several times a day depending on what the blood sugar level is before breakfast. States goal for A1c level is below 7.0 (last level on 06/1617 was 7.6).   States she had stroke 01/2017 and was having elevated blood pressure but now level is okay. States goal is to keep between 120/70-130/80.  States last visit to kidney doctor BP was low  & she was taken off one of medications. States she does own blood pressure monitor & has not taken blood pressure since level had been down. Encouraged patient to take blood pressure & record at least 1-2 times every 2 weeks.  Patient agreed to record BP and take to MD appointments.   States she gets medications at local pharmacy without problems. States she manages own medications & takes according to MD instructions.   Voices that she has transportation services via insurance & has no problems getting to medical appointments.   Patient voices she knows when to call emergency services & has Medic Alert.  States she has support from son as needed.   Patient voices that she has been to 1 class on diabetes done by Registered Dietician but would like to learn more about DM & managing her condition. Patient consents to [facility] care management .  Plan:  Refer to care management assistant to assign to [facility] for disease management.      Sherrin Daisy, RN BSN [place] Management Coordinator [facility] Care Management  [phone]

## 2017-09-12 ENCOUNTER — Other Ambulatory Visit: Payer: Self-pay

## 2017-09-12 ENCOUNTER — Encounter: Payer: Self-pay | Admitting: Internal Medicine

## 2017-09-12 NOTE — Patient Outreach (Signed)
[place] [name]) Care Management  [place]  09/12/2017   Monique Cabrera 1935/09/08 [id number]  Subjective: Telephone call to patient for initial assessment.  Patient reports she is doing well.  She reports that she lives alone but her children check on her frequently. Patient reports that she is independent with activities of daily living.  Patient reports that her A1c was 7.3 but her doctor wants it less than 7.  Patient has not seen her eye doctor in over a year.  Encouraged patient to have eyes checked yearly.  She verbalized understanding. Patient checks her sugar at least daily but not before meals.  Encouraged to check sugar before meals.  Last sugar check was 218.  Discussed with patient diet and lowering blood sugars.  She verbalized understanding.    Objective:   Encounter Medications:  Outpatient Encounter Prescriptions as of 09/12/2017  Medication Sig  . amLODipine (NORVASC) 10 MG tablet TAKE ONE TABLET BY MOUTH DAILY FOR HIGH BLOOD PRESSURE  . Blood Glucose Monitoring Suppl (ONETOUCH VERIO IQ SYSTEM) w/Device KIT Use as directed to check blood glucose. Dx: E11.9  . carvedilol (COREG) 25 MG tablet TAKE 1 TABLET BY MOUTH TWICE A DAY WITH A MEAL  . cloNIDine (CATAPRES) 0.1 MG tablet Take 0.1 mg by mouth at bedtime.  . clopidogrel (PLAVIX) 75 MG tablet TAKE ONE TABLET BY MOUTH ONCE DAILY  . glucose blood (ONETOUCH VERIO) test strip Use to test blood sugar three times daily. Dx: E11.9  . Insulin Glargine (LANTUS SOLOSTAR) 100 UNIT/ML Solostar Pen Inject 50 units under the skin daily for Diabetes  . insulin lispro (HUMALOG KWIKPEN) 100 UNIT/ML KiwkPen 6 units breakfast, 6 units lunch & supper  . lovastatin (MEVACOR) 20 MG tablet TAKE 1 TABLET (20 MG TOTAL) BY MOUTH AT BEDTIME FOR CHOLESTEROL.  . pantoprazole (PROTONIX) 40 MG tablet TAKE 1 TABLET (40 MG TOTAL) BY MOUTH DAILY.  . valsartan-hydrochlorothiazide (DIOVAN-HCT) 320-25 MG tablet Take 1 tablet by mouth  daily.  . Insulin Pen Needle (B-D ULTRAFINE III SHORT PEN) 31G X 8 MM MISC Use to check blood sugar every day. Dx: 11.29; 11.65   No facility-administered encounter medications on file as of 09/12/2017.     Functional Status:  In your present state of health, do you have any difficulty performing the following activities: 09/12/2017 02/18/2017  Hearing? N N  Vision? N N  Difficulty concentrating or making decisions? N N  Walking or climbing stairs? N Y  Dressing or bathing? N N  Doing errands, shopping? N [place] and eating ? N -  Using the Toilet? N -  In the past six months, have you accidently leaked urine? N -  Do you have problems with loss of bowel control? N -  Managing your Medications? N -  Managing your Finances? N -  Housekeeping or managing your Housekeeping? N -  Some recent data might be hidden    Fall/Depression Screening: Fall Risk  09/12/2017 09/06/2017 08/18/2017  Falls in the past year? No No No  Number falls in past yr: - - -  Comment - - -  Injury with Fall? - - -  Risk for fall due to : - Impaired balance/gait;Medication side effect -   PHQ 2/9 Scores 09/12/2017 09/06/2017 08/18/2017 06/10/2017 03/10/2017 03/04/2017 01/06/2017  PHQ - 2 Score 0 0 0 0 0 1 0    Assessment: Patient will benefit from health coach outreach for disease management and  support.     Plan:  [facility] CM Care Plan Problem One     Most Recent Value  Care Plan Problem One  Elevated A1c   Role Documenting the Problem One  [place] for Problem One  Active  THN Long Term Goal   Patient will decrease A1c by 0.2 points within 90 days.  THN Long Term Goal Start Date  09/12/17  Interventions for Problem One [place] discussed with patient goal of lowering A1c and diet.    THN CM Short Term Goal #1   Patient will report checking blood sugars prior to breakfast within 30 days.  THN CM Short Term Goal #1 Start Date  09/12/17  Interventions for  Short Term Goal #1  RN Health Coach discussed with patient checking blood sugar before breakfast and importance.   THN CM Short Term Goal #2   Patient will report checking feet daily within 30 days.   THN CM Short Term Goal #2 Start Date  09/12/17  Interventions for Short Term Goal #2  [place] discussed with patient importance of checking feet daily.       RN Health Coach will provide ongoing education for patient on diabetes through phone calls and sending printed information to patient for further discussion.  RN Health Coach will send welcome packet with consent to patient as well as printed information on diabetes.  RN Health Coach will send initial barriers letter, assessment, and care plan to primary care physician.  RN Health Coach will contact patient in the month of October and patient agrees to next outreach.

## 2017-09-19 ENCOUNTER — Encounter: Payer: Self-pay | Admitting: Podiatry

## 2017-09-20 ENCOUNTER — Encounter: Payer: Self-pay | Admitting: Podiatry

## 2017-09-30 NOTE — Progress Notes (Signed)
[facility] Note  10/03/2017     CHIEF COMPLAINT Patient presents for Retina Evaluation and Diabetic Eye Exam   HISTORY OF PRESENT ILLNESS: Monique Cabrera is a 82 y.o. female who presents to the clinic today for:   HPI    Retina Evaluation  In both eyes.  This started 20 years ago.  Associated Symptoms Floaters, Flashes and Glare.  Negative for Pain, Distortion, Redness, Trauma, Shoulder/Hip pain, Fatigue, Weight Loss, Jaw Claudication, Blind Spot, Photophobia, Scalp Tenderness and Fever.  Context:  distance vision, driving, night driving, reading, near vision and watching TV.  Treatments tried include no treatments.  I, the attending physician,  performed the HPI with the patient and updated documentation appropriately.        Diabetic Eye Exam  Vision is worsening.  Associated Symptoms Floaters, Flashes and Glare.  Negative for Blind Spot, Scalp Tenderness, Photophobia, Fever, Weight Loss, Jaw Claudication, Pain, Distortion, Redness, Trauma, Shoulder/Hip pain and Fatigue.  Diabetes characteristics include Type 2 and on insulin.  This started 20 years ago.  Blood sugar level fluctuates.  Last Blood Glucose 175.  Last A1C 7.  Associated Diagnosis Kidney Disease.  I, the attending physician,  performed the HPI with the patient and updated documentation appropriately.        Comments  Patient referred by  DR. Morton Peters, DO for retinal exam due to floaters, flashes and glare OU, Patient is uncontrolled  2 diabetic with Kidney disease . 10-02-17 CBG 175. She denies pain and is not using eye gtts or taking any vits. Last saw retina specialist in 2017 and was a former pt of Dr. Ricki Miller.     Last edited by Bernarda Caffey, MD on 10/04/2017  9:13 AM. (History)      Referring physician: [name], DO [place], [place] [id number]  HISTORICAL INFORMATION:   Selected notes from the MEDICAL RECORD NUMBER Referral from Dr. Morton Peters for DM exam; Ocular Hx  - pseudophakia OU PMH - DM type II; CKD; HTN; Stroke; Herpes Zoster;    CURRENT MEDICATIONS: No current outpatient prescriptions on file. (Ophthalmic Drugs)   No current facility-administered medications for this visit.  (Ophthalmic Drugs)   Current Outpatient Prescriptions (Other)  Medication Sig  . amLODipine (NORVASC) 10 MG tablet TAKE ONE TABLET BY MOUTH DAILY FOR HIGH BLOOD PRESSURE  . Blood Glucose Monitoring Suppl (ONETOUCH VERIO IQ SYSTEM) w/Device KIT Use as directed to check blood glucose. Dx: E11.9  . carvedilol (COREG) 25 MG tablet TAKE 1 TABLET BY MOUTH TWICE A DAY WITH A MEAL  . cloNIDine (CATAPRES) 0.1 MG tablet Take 0.1 mg by mouth at bedtime.  . clopidogrel (PLAVIX) 75 MG tablet TAKE ONE TABLET BY MOUTH ONCE DAILY  . glucose blood (ONETOUCH VERIO) test strip Use to test blood sugar three times daily. Dx: E11.9  . Insulin Glargine (LANTUS SOLOSTAR) 100 UNIT/ML Solostar Pen Inject 50 units under the skin daily for Diabetes  . insulin lispro (HUMALOG KWIKPEN) 100 UNIT/ML KiwkPen 6 units breakfast, 6 units lunch & supper  . Insulin Pen Needle (B-D ULTRAFINE III SHORT PEN) 31G X 8 MM MISC Use to check blood sugar every day. Dx: 11.29; 11.65  . lovastatin (MEVACOR) 20 MG tablet TAKE 1 TABLET (20 MG TOTAL) BY MOUTH AT BEDTIME FOR CHOLESTEROL.  . pantoprazole (PROTONIX) 40 MG tablet TAKE 1 TABLET (40 MG TOTAL) BY MOUTH DAILY.  . valsartan-hydrochlorothiazide (DIOVAN-HCT) 320-25 MG tablet Take 1  tablet by mouth daily.   No current facility-administered medications for this visit.  (Other)      REVIEW OF SYSTEMS: ROS    Positive for: Genitourinary, Endocrine, Cardiovascular, Eyes   Negative for: Constitutional, Gastrointestinal, Neurological, Skin, Musculoskeletal, HENT, Respiratory, Psychiatric, Allergic/Imm, Heme/Lymph   Last edited by Zenovia Jordan, LPN on 40/08/8118  1:47 PM. (History)       ALLERGIES Allergies  Allergen Reactions  . Invokana [Canagliflozin] Other  (See Comments)    Vagina itching and swelling Vaginal Itching and irritation   . Jardiance [Empagliflozin] Itching and Other (See Comments)    Vaginal itching and swelling    PAST MEDICAL HISTORY Past Medical History:  Diagnosis Date  . Acute upper respiratory infections of unspecified site   . Anemia   . Anemia, unspecified   . Atherosclerosis of native arteries of the extremities, unspecified   . Chest pain, unspecified   . Chronic kidney disease (CKD), stage II (mild)   . Diabetes mellitus   . Diarrhea   . Disorder of bone and cartilage, unspecified   . DM (diabetes mellitus) type II controlled with renal manifestation ([place])   . Herpes zoster with other nervous system complications(053.19)   . Hypercalcemia   . Hypertension   . Hypertensive renal disease, benign   . Nonspecific reaction to tuberculin skin test without active tuberculosis(795.51)   . Nonspecific tuberculin test reaction   . Other and unspecified hyperlipidemia   . Pain in joint, lower leg   . Peripheral arterial disease ([place])   . Postherpetic neuralgia   . Proteinuria   . Stroke ([place]) 01/2017  . Type II or unspecified type diabetes mellitus with renal manifestations, not stated as uncontrolled(250.40)   . Type II or unspecified type diabetes mellitus with renal manifestations, uncontrolled(250.42)   . Unspecified disorder of kidney and ureter   . Unspecified essential hypertension    Past Surgical History:  Procedure Laterality Date  . hysterectomy    . INCISION AND DRAINAGE Left 05/27/14   sebacous cyst, ear  . PRP Left    Dr. Ricki Miller  . removal of cyst from hand    . removal of tumor from foot    . TONSILLECTOMY      FAMILY HISTORY Family History  Problem Relation Age of Onset  . Diabetes Mother   . Diabetes Father   . Diabetes Sister   . Diabetes Sister     SOCIAL HISTORY Social History  Substance Use Topics  . Smoking status: Former Smoker    Types: Cigarettes  . Smokeless  tobacco: Never Used     Comment: Quit about age 14   . Alcohol use No         OPHTHALMIC EXAM:  Base Eye Exam    Visual Acuity (Snellen - Linear)      Right Left   Dist cc 20/80 +2 cf at face   Dist ph cc NI NI   Correction:  Glasses       Tonometry (Tonopen, 1:27 PM)      Right Left   Pressure 15 20       Pupils      Dark Light Shape React APD   Right 2 2 Round Minimal None   Left 3  Irregular NR None   Double checked and edited by [facility]       Visual Fields      Left Right     Full   Restrictions Total superior temporal, inferior temporal, superior  nasal, inferior nasal deficiencies    Double check by [facility] 2       Extraocular Movement      Right Left    Full Full       Neuro/Psych    Oriented x3:  Yes   Mood/Affect:  Normal       Dilation    Both eyes:  1.0% Mydriacyl, 2.5% Phenylephrine @ 1:40 PM        Slit Lamp and Fundus Exam    External Exam      Right Left   External Normal Normal       Slit Lamp Exam      Right Left   Lids/Lashes Dermatochalasis - upper lid; pigmented lesion non marginal on lower lid Dermatochalasis - upper lid   Conjunctiva/Sclera White and quiet White and quiet   Cornea 2+ [place] - pigmented; well healed cataract wounds 2+ [place] - pigmented; well healed cataract wounds   Anterior Chamber Deep and quiet Deep and quiet   [place] round and moderate dilation, 77m; No NVI; atrophy at 0800 round and moderate dilation, 575m No NVI; atrophy at 0130   Lens Posterior chamber intraocular lens - in good postion; 1+ PC folds Posterior chamber intraocular lens - in good postion; open PC;    Vitreous Vitreous syneresis Vitreous syneresis; anterior pigment       Fundus Exam      Right Left   Disc Pallor; pigment depostion along rim Pallor   C/D Ratio 0.5 0.8   Macula Retinal pigment epithelial mottling; ERM; Mas; Pigmented scars temporally Scarring with pigment depostion; +MAs, Dot blot hemorrhages temporally    Vessels Vascular  attenuation Vascular attenuation   Periphery Attached; 360 PRP laser scars - room for fill in; scattered dot and blot hemorrhages;  Attached; scattered dot blot hemorrhages; heavy PRP scarring 360;        Refraction    Wearing Rx      Sphere Cylinder Axis Add   Right -2.00 +1.75 029 +2.50   Left -2.00 +1.75 037 +2.50   Age:  1y51yrType:  Bifocal   [facility]    Manifest Refraction      Sphere Cylinder Axis Dist VA   Right -1.50 +1.75 030 20/40   Left [place]   CF (NI)   [facility]       IMAGING AND PROCEDURES  Imaging and Procedures for 10/04/17  OCT, Retina - OU - Both Eyes     Right Eye Quality was good. Central Foveal Thickness: 175. Progression has no prior data. Findings include outer retinal atrophy, normal foveal contour, epiretinal membrane, no IRF, no SRF, central atrophy (Central outer retinal atrophy; focal laser scars temporally).   Left Eye Quality was good. Central Foveal Thickness: 217. Progression has no prior data. Findings include abnormal foveal contour, outer retinal atrophy, central atrophy, no SRF, no IRF, pigment epithelial detachment (Extensive ellipsoid loss).   Notes Images taken, stored on drive   Diagnosis / Impression:  Central outer retinal atrophy OU (OS >> OD) No DME  Clinical management:  See below  Abbreviations: NFP - Normal foveal profile. CME - cystoid macular edema. PED - pigment epithelial detachment. IRF - intraretinal fluid. SRF - subretinal fluid. EZ - ellipsoid zone. ERM - epiretinal membrane. ORA - outer retinal atrophy. ORT - outer retinal tubulation. SRHM - subretinal hyper-reflective material       Fluorescein Angiography Optos (Transit OD)  Right Eye Progression has no prior data. Early phase findings include retinal neovascularization, microaneurysm, window defect, vascular perfusion defect. Mid/Late phase findings include retinal neovascularization, microaneurysm, staining, vascular perfusion defect.    Left Eye Progression has no prior data. Early phase findings include window defect, microaneurysm, vascular perfusion defect. Mid/Late phase findings include window defect, microaneurysm, vascular perfusion defect.   Notes OD: scattered Mas, 3 focal areas of leakage consistent with early NV (inf temp macula and sup nasal to disc); late peripheral leakage and peripheral nonperfusion. OS: heavy laser; scattered Mas; mild peripheral nonperfusion              ASSESSMENT/PLAN:    ICD-10-CM   1. Stable proliferative diabetic retinopathy of both eyes associated with type 2 diabetes mellitus ([place]) P71.0626 OCT, Retina - OU - Both Eyes    Fluorescein Angiography [place] (Transit OD)    Fluorescein Angiography Optos (Transit OD)  2. Low vision, one eye H54.50   3. Pseudophakia of both eyes Z96.1     1. Stable proliferative diabetic retinopathy, both eyes - The incidence, risk factors for progression, natural history and treatment options for diabetic retinopathy  were discussed with patient.   - The need for close monitoring of blood glucose, blood pressure, and serum lipids, avoiding cigarette or any type of tobacco, and the need for long term follow up was also discussed with patient. - No diabetic macular edema, both eyes  - FA shows 3 areas of mild punctate leakage OD - recommend light PRP fill in OD - pt wishes to proceed with additional laser in 2 wks - f/u in 2 wks for fill in PRP OD (poor dilator)  2. Low vision / legal blindness OS - extensive laser scarring in macula OS - reports low vision OS since ~2002-2003 - functionally monocular  3. Pseudophakia OU  - s/p CE/IOL OU  - doing well  - monitor   Ophthalmic Meds Ordered this visit:  No orders of the defined types were placed in this encounter.      Return in about 2 weeks (around 10/17/2017) for Dilated exam; fill in PRP OD.  There are no Patient Instructions on file for this visit.   Explained the  diagnoses, plan, and follow up with the patient and they expressed understanding.  Patient expressed understanding of the importance of proper follow up care.   Gardiner Sleeper, M.D., Ph.D. Vitreoretinal Surgeon [facility] 10/04/17     Abbreviations: M myopia (nearsighted); A astigmatism; H hyperopia (farsighted); P presbyopia; Mrx spectacle prescription;  CTL contact lenses; OD right eye; OS left eye; OU both eyes  XT exotropia; ET esotropia; PEK punctate epithelial keratitis; PEE punctate epithelial erosions; DES dry eye syndrome; MGD meibomian gland dysfunction; ATs artificial tears; PFAT's preservative free artificial tears; [place] nuclear sclerotic cataract; PSC posterior subcapsular cataract; ERM epi-retinal membrane; PVD posterior vitreous detachment; RD retinal detachment; DM diabetes mellitus; DR diabetic retinopathy; NPDR non-proliferative diabetic retinopathy; PDR proliferative diabetic retinopathy; CSME clinically significant macular edema; DME diabetic macular edema; dbh dot blot hemorrhages; CWS cotton wool spot; POAG primary open angle glaucoma; C/D cup-to-disc ratio; HVF humphrey visual field; GVF goldmann visual field; OCT optical coherence tomography; IOP intraocular pressure; BRVO Branch retinal vein occlusion; CRVO central retinal vein occlusion; CRAO central retinal artery occlusion; BRAO branch retinal artery occlusion; RT retinal tear; SB scleral buckle; PPV pars plana vitrectomy; VH Vitreous hemorrhage; PRP panretinal laser photocoagulation; IVK intravitreal kenalog; VMT vitreomacular traction; MH Macular hole;  NVD neovascularization of the disc; NVE neovascularization elsewhere; AREDS age related eye disease study; ARMD age related macular degeneration; POAG primary open angle glaucoma; EBMD epithelial/anterior basement membrane dystrophy; ACIOL anterior chamber intraocular lens; IOL intraocular lens; PCIOL posterior chamber intraocular lens; Phaco/IOL  phacoemulsification with intraocular lens placement; [place] photorefractive keratectomy; LASIK laser assisted in situ keratomileusis; HTN hypertension; DM diabetes mellitus; COPD chronic obstructive pulmonary disease

## 2017-10-03 ENCOUNTER — Ambulatory Visit (INDEPENDENT_AMBULATORY_CARE_PROVIDER_SITE_OTHER): Payer: Medicare Other | Admitting: Ophthalmology

## 2017-10-03 ENCOUNTER — Encounter (INDEPENDENT_AMBULATORY_CARE_PROVIDER_SITE_OTHER): Payer: Medicare Other | Admitting: Ophthalmology

## 2017-10-03 ENCOUNTER — Encounter (INDEPENDENT_AMBULATORY_CARE_PROVIDER_SITE_OTHER): Payer: Self-pay | Admitting: Ophthalmology

## 2017-10-03 ENCOUNTER — Other Ambulatory Visit: Payer: Self-pay | Admitting: *Deleted

## 2017-10-03 DIAGNOSIS — E785 Hyperlipidemia, unspecified: Principal | ICD-10-CM

## 2017-10-03 DIAGNOSIS — Z961 Presence of intraocular lens: Secondary | ICD-10-CM

## 2017-10-03 DIAGNOSIS — H5450 Low vision, one eye, unspecified eye: Secondary | ICD-10-CM

## 2017-10-03 DIAGNOSIS — E113553 Type 2 diabetes mellitus with stable proliferative diabetic retinopathy, bilateral: Secondary | ICD-10-CM

## 2017-10-03 DIAGNOSIS — E1169 Type 2 diabetes mellitus with other specified complication: Secondary | ICD-10-CM

## 2017-10-03 MED ORDER — LOVASTATIN 20 MG PO TABS: ORAL_TABLET | ORAL | 0 refills | Status: DC

## 2017-10-03 NOTE — Telephone Encounter (Signed)
[facility] 

## 2017-10-05 ENCOUNTER — Ambulatory Visit: Payer: Self-pay

## 2017-10-05 ENCOUNTER — Other Ambulatory Visit: Payer: Self-pay

## 2017-10-05 NOTE — Progress Notes (Signed)
Order(s) created erroneously. Erroneous order ID: [id number]  Order canceled by: Milas Hock  Order cancel date/time: 10/05/2017 2:03 PM

## 2017-10-05 NOTE — Patient Outreach (Signed)
[facility]) Care Management  10/05/2017  JALYNE BRODZINSKI [birth year] [id number]   Outreach attempt # 1  to patient. No answer.  Unable to leave a message.  Plan: RN HEALTH COACH will make outreach attempt to patient within two business days.    Lazaro Arms RN, BSN, [place] Direct Dial:  [phone] Fax: [phone]

## 2017-10-06 ENCOUNTER — Ambulatory Visit (INDEPENDENT_AMBULATORY_CARE_PROVIDER_SITE_OTHER): Payer: Medicare Other | Admitting: Internal Medicine

## 2017-10-06 ENCOUNTER — Telehealth: Payer: Self-pay | Admitting: *Deleted

## 2017-10-06 ENCOUNTER — Encounter: Payer: Self-pay | Admitting: Internal Medicine

## 2017-10-06 VITALS — BP 130/80 | HR 60 | Temp 98.6°F | Wt 152.0 lb

## 2017-10-06 DIAGNOSIS — E1129 Type 2 diabetes mellitus with other diabetic kidney complication: Secondary | ICD-10-CM

## 2017-10-06 DIAGNOSIS — R438 Other disturbances of smell and taste: Secondary | ICD-10-CM

## 2017-10-06 DIAGNOSIS — IMO0002 Reserved for concepts with insufficient information to code with codable children: Secondary | ICD-10-CM

## 2017-10-06 DIAGNOSIS — E11319 Type 2 diabetes mellitus with unspecified diabetic retinopathy without macular edema: Secondary | ICD-10-CM

## 2017-10-06 DIAGNOSIS — I739 Peripheral vascular disease, unspecified: Secondary | ICD-10-CM

## 2017-10-06 DIAGNOSIS — I10 Essential (primary) hypertension: Secondary | ICD-10-CM

## 2017-10-06 DIAGNOSIS — E785 Hyperlipidemia, unspecified: Secondary | ICD-10-CM

## 2017-10-06 DIAGNOSIS — M8589 Other specified disorders of bone density and structure, multiple sites: Secondary | ICD-10-CM

## 2017-10-06 DIAGNOSIS — E1169 Type 2 diabetes mellitus with other specified complication: Secondary | ICD-10-CM

## 2017-10-06 DIAGNOSIS — N182 Chronic kidney disease, stage 2 (mild): Secondary | ICD-10-CM

## 2017-10-06 DIAGNOSIS — E1165 Type 2 diabetes mellitus with hyperglycemia: Secondary | ICD-10-CM | POA: Diagnosis not present

## 2017-10-06 NOTE — Progress Notes (Signed)
Location:  [facility] clinic Provider:  Haden Suder L. Mariea Clonts, D.O., C.M.D.  Code Status: full code Goals of Care:  Advanced Directives 08/18/2017  Does Patient Have a Medical Advance Directive? No  Would patient like information on creating a medical advance directive? Yes (MAU/Ambulatory/Procedural Areas - Information given)  Pre-existing out of facility DNR order (yellow form or pink MOST form) -   Chief Complaint  Patient presents with  . Medical Management of Chronic Issues    94mh follow-up    HPI: Patient is a 82y.o. female seen today for medical management of chronic diseases.    Apparently, her nephrologist told her she should not take protonix.  The last note I have is from there is in Sept with plans for a 6 mo f/u.  She was continued on this as noted in this system in June due to ongoing symptoms in the afternoons.  She does not feel like she's had any recent symptoms of reflux.  We discussed that we can try to taper it.  She had been having a bitter taste of her tongue after her stroke and I suspected it may have been GERD (that was her June appt).  Discussed it can get worse when she's taken off.  Sees nephrologist for her proteinuria.    She saw her ophthalmologist for retinopathy and we have this note--appreciate this.  Stable proliferative diabetic retinopathy, both eyes was diagnosis.  She has historically seen Dr. SGershon Craneand Dr. RZadie Rhinebefore.  Now needs to go to get new glasses.  LDL not at goal.  Last was 102 in Feb of this year.    Has been eating and taking her insulin.  Says she's felt better ever since her trip to the beach with her daughter.  She's walking less due to leg pain.  Will walk to the trash.  Has to stop and wait before she comes all the way home.  Walks about monthly to the office of the complex.  Legs hurt at ankles, sits a little, then walks back.  Had prior ABIs in 2014 that showed arterial disease by Dr. GEinar Gip  Seems she needs a f/u test at this point due to  worsening claudication.  [facility]  She has quit mammograms at 89    Has upcoming podiatry visit for nail trimming and foot exam  Past Medical History:  Diagnosis Date  . Acute upper respiratory infections of unspecified site   . Anemia   . Anemia, unspecified   . Atherosclerosis of native arteries of the extremities, unspecified   . Chest pain, unspecified   . Chronic kidney disease (CKD), stage II (mild)   . Diabetes mellitus   . Diarrhea   . Disorder of bone and cartilage, unspecified   . DM (diabetes mellitus) type II controlled with renal manifestation ([place]   . Herpes zoster with other nervous system complications(053.19)   . Hypercalcemia   . Hypertension   . Hypertensive renal disease, benign   . Nonspecific reaction to tuberculin skin test without active tuberculosis(795.51)   . Nonspecific tuberculin test reaction   . Other and unspecified hyperlipidemia   . Pain in joint, lower leg   . Peripheral arterial disease ([place]   . Postherpetic neuralgia   . Proteinuria   . Stroke ([place] 01/2017  . Type II or unspecified type diabetes mellitus with renal manifestations, not stated as uncontrolled(250.40)   . Type II or unspecified type diabetes  mellitus with renal manifestations, uncontrolled(250.42)   . Unspecified disorder of kidney and ureter   . Unspecified essential hypertension     Past Surgical History:  Procedure Laterality Date  . hysterectomy    . INCISION AND DRAINAGE Left 05/27/14   sebacous cyst, ear  . PRP Left    Dr. Ricki Miller  . removal of cyst from hand    . removal of tumor from foot    . TONSILLECTOMY      Allergies  Allergen Reactions  . Invokana [Canagliflozin] Other (See Comments)    Vagina itching and swelling Vaginal Itching and irritation   . Jardiance [Empagliflozin] Itching and Other (See Comments)    Vaginal itching and swelling    Outpatient Encounter Prescriptions as of 10/06/2017    Medication Sig  . amLODipine (NORVASC) 10 MG tablet TAKE ONE TABLET BY MOUTH DAILY FOR HIGH BLOOD PRESSURE  . carvedilol (COREG) 25 MG tablet TAKE 1 TABLET BY MOUTH TWICE A DAY WITH A MEAL  . cloNIDine (CATAPRES) 0.1 MG tablet Take 0.1 mg by mouth at bedtime.  . clopidogrel (PLAVIX) 75 MG tablet TAKE ONE TABLET BY MOUTH ONCE DAILY  . glucose blood (ONETOUCH VERIO) test strip Use to test blood sugar three times daily. Dx: E11.9  . Insulin Glargine (LANTUS SOLOSTAR) 100 UNIT/ML Solostar Pen Inject 50 units under the skin daily for Diabetes  . insulin lispro (HUMALOG KWIKPEN) 100 UNIT/ML KiwkPen 6 units breakfast, 6 units lunch & supper  . Insulin Pen Needle (B-D ULTRAFINE III SHORT PEN) 31G X 8 MM MISC Use to check blood sugar every day. Dx: 11.29; 11.65  . lovastatin (MEVACOR) 20 MG tablet TAKE 1 TABLET (20 MG TOTAL) BY MOUTH AT BEDTIME FOR CHOLESTEROL.  . pantoprazole (PROTONIX) 40 MG tablet TAKE 1 TABLET (40 MG TOTAL) BY MOUTH DAILY.  . valsartan-hydrochlorothiazide (DIOVAN-HCT) 320-25 MG tablet Take 1 tablet by mouth daily.  . [DISCONTINUED] Blood Glucose Monitoring Suppl (ONETOUCH VERIO IQ SYSTEM) w/Device KIT Use as directed to check blood glucose. Dx: E11.9   No facility-administered encounter medications on file as of 10/06/2017.     Review of Systems:  Review of Systems  Constitutional: Negative for chills, fever and malaise/fatigue.  HENT: Negative for congestion.   Eyes:       Retinopathy  Respiratory: Negative for cough and shortness of breath.   Cardiovascular: Positive for claudication. Negative for chest pain and PND.  Gastrointestinal: Negative for abdominal pain, blood in stool, constipation and melena.  Genitourinary: Negative for dysuria.  Musculoskeletal: Negative for falls.  Neurological: Negative for dizziness, loss of consciousness and weakness.  Endo/Heme/Allergies: Does not bruise/bleed easily.       Diabetes  Psychiatric/Behavioral: Positive for memory loss.  Negative for depression.    Health Maintenance  Topic Date Due  . OPHTHALMOLOGY EXAM  08/03/2016  . FOOT EXAM  06/11/2017  . INFLUENZA VACCINE  07/27/2017  . HEMOGLOBIN A1C  01/11/2018  . DEXA SCAN  Completed  . PNA vac Low Risk Adult  Completed    Physical Exam: Vitals:   10/06/17 0822  BP: 130/80  Pulse: 60  Temp: 98.6 F (37 C)  TempSrc: Oral  SpO2: 95%  Weight: 152 lb (68.9 kg)   Body mass index is 25.29 kg/m. Physical Exam  Constitutional: She is oriented to person, place, and time. She appears well-developed. No distress.  Cardiovascular: Normal rate, regular rhythm, normal heart sounds and intact distal pulses.   Pulses weak, but palpable dorsalis pedis, feet warm  Pulmonary/Chest: Effort normal and breath sounds normal. No respiratory distress.  Abdominal: Bowel sounds are normal.  Neurological: She is alert and oriented to person, place, and time.  Poor historian  Psychiatric: She has a normal mood and affect.    Labs reviewed: Basic Metabolic Panel:  Recent Labs  04/07/17 0915 06/26/17 2252 07/11/17 0831  NA 136 130* 138  K 4.0 3.5 4.1  CL 104 102 106  CO2 24 20* 23  GLUCOSE 102* 209* 167*  BUN 25 25* 30*  CREATININE 1.52* 1.84* 1.65*  CALCIUM 10.0 9.4 9.8   Liver Function Tests:  Recent Labs  02/19/17 0349 04/07/17 0915 07/11/17 0831  AST '15 17 16  ' ALT 10* 17 17  ALKPHOS 53 57 72  BILITOT 0.5 0.4 0.4  PROT 6.3* 6.7 6.5  ALBUMIN 3.3* 3.8 3.7   No results for input(s): LIPASE, AMYLASE in the last 8760 hours. No results for input(s): AMMONIA in the last 8760 hours. CBC:  Recent Labs  02/17/17 0915 02/18/17 1230 02/19/17 0349 06/26/17 2252  WBC 8.1  --  6.2 7.0  NEUTROABS 5.3  --   --   --   HGB 11.7*  --  10.9* 10.4*  HCT 34.1* 35.8 32.3* 30.3*  MCV 89.0  --  88.5 89.4  PLT 222  --  228 218   Lipid Panel:  Recent Labs  02/17/17 2355  CHOL 187  HDL 61  LDLCALC 102*  TRIG 121  CHOLHDL 3.1   Lab Results  Component  Value Date   HGBA1C 7.6 (H) 07/11/2017    Assessment/Plan 1. Hyperlipidemia associated with type 2 diabetes mellitus ([place]) -on lovastatin 42m--cont this and get FLP before visit with Cathey with other labs - Lipid panel; Future  2. Type II diabetes mellitus with renal manifestations, uncontrolled (HCC) -cont insulin therapy, arb, statin therapy  3. Diabetic retinopathy of both eyes associated with type 2 diabetes mellitus, macular edema presence unspecified, unspecified retinopathy severity ([place] -keep regular f/u with retina specialist, also f/u for optometry for new glasses  4. Essential hypertension, benign -bp at goal with current therapy  5. Chronic kidney disease (CKD), stage II (mild) -Avoid nephrotoxic agents like nsaids, dose adjust renally excreted meds, hydrate. -will stop protonix due to her absence of symptoms  6. Bitter taste -resolved, unclear if due to neurologic issue or reflux-will see if it returns off protonix  7. Osteopenia of multiple sites - counseled on importance of bone density, stopping protonix should help this also - DG Bone Density; Future  8. PAD (peripheral artery disease) ([place] -noted in 2014 in study by Dr. GEinar Gip- will do f/u ABI now due to increased difficulty with claudication--may need intervention? -encouraged more walking - VAS [place]OWER EXT ART SEG MULTI ([place]; Future  Labs/tests ordered:   Orders Placed This Encounter  Procedures  . DG Bone Density    Standing Status:   Future    Standing Expiration Date:   12/06/2018    Order Specific Question:   Reason for Exam (SYMPTOM  OR DIAGNOSIS REQUIRED)    Answer:   osteopenia, due for 2 year f/u bone density    Order Specific Question:   Preferred imaging location?    Answer:   External    Comments:   [facility]   . Lipid panel    Standing Status:   Future    Standing Expiration Date:   11/17/2018    Order Specific Question:   Has the patient  fasted?    Answer:   Yes    Next appt:  10/20/2017   Kalei Meda L. Luceal Hollibaugh, D.O. [facility] [address], [place] [address] Cell Phone (Mon-Fri 8am-5pm):  [phone] On Call:  [phone] & follow prompts after 5pm & weekends Office Phone:  [phone] Office Fax:  [phone]

## 2017-10-06 NOTE — Telephone Encounter (Signed)
Monique Cabrera with [facility] [address] called and stated that they received an order placed by Dr. Mariea Clonts for segmentals but they do not do this at that location and the order needs to be changed to [facility] location.

## 2017-10-06 NOTE — Telephone Encounter (Signed)
I just want the usual arterial doppler with ABIs.  I thought I had the right one saved from previous orders, but apparently not.

## 2017-10-06 NOTE — Patient Instructions (Signed)
Stop protonix. You may have increased acid reflux for a couple of weeks for which you may take over the counter zantac if needed  Also, please get your bone density done at [facility].

## 2017-10-07 ENCOUNTER — Other Ambulatory Visit: Payer: Self-pay

## 2017-10-07 NOTE — Patient Outreach (Signed)
[facility] [facility]) Care Management  10/07/2017  Monique Cabrera 1935/03/30 [id number]   Outreach attempt # 2  to patient. Line busy.  Unable to leave a message.  Plan: RN HEALTH COACH will make outreach attempt to patient within five business days.    Lazaro Arms RN, BSN, [place] Direct Dial: [phone]

## 2017-10-10 ENCOUNTER — Ambulatory Visit: Payer: Medicare Other | Admitting: Podiatry

## 2017-10-14 ENCOUNTER — Other Ambulatory Visit: Payer: Self-pay | Admitting: Licensed Clinical Social Worker

## 2017-10-14 ENCOUNTER — Other Ambulatory Visit: Payer: Self-pay

## 2017-10-14 NOTE — Patient Outreach (Addendum)
[facility]) Care Management  10/14/2017  Monique Cabrera 11-16-1935 [id number]  Assessment- CSW received new referral on patient. Patient reported to [facility] that she is often stays home and wishes to gain some socialization. Patient is interested in both the [facility] and [facility] CSW completed initial call and was able to reach patient. HIPPA verifications were provided. CSW introduced self, reason for call and of social work services. Patient reports not needing a home visit. She shares that she prefers for information to be mailed out to her. CSW completed review of socialization resources: [place], [employer], [employer], Social research officer, government. Patient is very Patent attorney of information. She denies having any other social work concerns. She reports having stable transportation through [employer]. She states that she has not used [facility] before but is agreeable to start using service for non medical appointments. CSW provided [facility] reservation number. Patient is agreeable to social work discharge.  CSW will update [facility] RNCM.  CSW completed call to [facility] and requested that their newsletter be delivered by mail each month.  Plan-CSW will send requested community resources to patient's residence. CSW encouraged patient to contact her if she has any questions or concerns. CSW will close case at this time.  Eula Fried, BSW, MSW, [email or web address] Phone: [phone] Fax: [phone]

## 2017-10-14 NOTE — Patient Outreach (Signed)
[facility]) Care Management  [place]  10/14/2017   Monique Cabrera 07/10/1935 [id number]  Subjective: RN Health Coach called and spoke with the patient for monthly outreach.  HIPAA Verified. The patient was weary of me calling because she stated that she has been bombarded with calls about appointments that she knew nothing about.  It has become aggravating to her to the point she will not answer the phone if she does not know the number.  I re-explained who I was the services that we offer at [facility] and she was satisfied.  RN Health Coach advised her to call her physician office and discuss with them about the appointments. The patient stated that she has not checked her blood sugar this morning.  She stated that she is taking her medication regularly and eating 3 times a day.  She is feeling better after changing her eating habits and has not had any lows.  She is proud of her change but has gained weight and she is not happy with that. RN Health Coach talked with her about her diet and commended her on eating three times a day. The patient stated that she has an appointment with her podiatrist next week on the 23 rd. She also told me that she has been having pain in her legs and she rated the pain about an 8.  She stated that she has an appointment to have her legs checked on 1st of November. The patient stated that she stays in her home all day and has no one to interact with.  She would like to get out of the home and interact with people.  I advised her that I would make a referral with a social worker who could provide her with some resources and the patient agreed.  Objective:   Encounter Medications:  Outpatient Encounter Prescriptions as of 10/14/2017  Medication Sig  . amLODipine (NORVASC) 10 MG tablet TAKE ONE TABLET BY MOUTH DAILY FOR HIGH BLOOD PRESSURE  . carvedilol (COREG) 25 MG tablet TAKE 1 TABLET BY MOUTH TWICE A DAY WITH A MEAL  .  cloNIDine (CATAPRES) 0.1 MG tablet Take 0.1 mg by mouth at bedtime.  . clopidogrel (PLAVIX) 75 MG tablet TAKE ONE TABLET BY MOUTH ONCE DAILY  . glucose blood (ONETOUCH VERIO) test strip Use to test blood sugar three times daily. Dx: E11.9  . Insulin Glargine (LANTUS SOLOSTAR) 100 UNIT/ML Solostar Pen Inject 50 units under the skin daily for Diabetes  . insulin lispro (HUMALOG KWIKPEN) 100 UNIT/ML KiwkPen 6 units breakfast, 6 units lunch & supper  . Insulin Pen Needle (B-D ULTRAFINE III SHORT PEN) 31G X 8 MM MISC Use to check blood sugar every day. Dx: 11.29; 11.65  . lovastatin (MEVACOR) 20 MG tablet TAKE 1 TABLET (20 MG TOTAL) BY MOUTH AT BEDTIME FOR CHOLESTEROL.  Marland Kitchen valsartan-hydrochlorothiazide (DIOVAN-HCT) 320-25 MG tablet Take 1 tablet by mouth daily.   No facility-administered encounter medications on file as of 10/14/2017.     Functional Status:  In your present state of health, do you have any difficulty performing the following activities: 09/12/2017 02/18/2017  Hearing? N N  Vision? N N  Difficulty concentrating or making decisions? N N  Walking or climbing stairs? N Y  Dressing or bathing? N N  Doing errands, shopping? N [place] and eating ? N -  Using the Toilet? N -  In the past six months, have  you accidently leaked urine? N -  Do you have problems with loss of bowel control? N -  Managing your Medications? N -  Managing your Finances? N -  Housekeeping or managing your Housekeeping? N -  Some recent data might be hidden    Fall/Depression Screening: Fall Risk  10/14/2017 10/06/2017 09/12/2017  Falls in the past year? No No No  Number falls in past yr: - - -  Comment - - -  Injury with Fall? - - -  Risk for fall due to : - - -   PHQ 2/9 Scores 10/14/2017 10/06/2017 09/12/2017 09/06/2017 08/18/2017 06/10/2017 03/10/2017  PHQ - 2 Score 2 0 0 0 0 0 0  PHQ- 9 Score 3 - - - - - -    Assessment:Patient continues to benefit from health coach outreach  for disease management and support.   THN CM Care Plan Problem One     Most Recent Value  Care Plan Problem One  Elevated A1c   Role Documenting the Problem One  [place] for Problem One  Active  THN Long Term Goal   Patient will decrease A1c by 0.2 points within 90 days.  THN Long Term Goal Start Date  09/12/17  Interventions for Problem One [place] reinterated with patient goal of lowering A1c and diet.    THN CM Short Term Goal #1   Patient will report checking blood sugars prior to breakfast within 30 days.  THN CM Short Term Goal #1 Start Date  09/12/17  Interventions for Short Term Goal #1  RN Health Coach reinterated with patient checking blood sugar before breakfast and importance.   THN CM Short Term Goal #2   Patient will report checking feet daily within 30 days.   THN CM Short Term Goal #2 Start Date  09/12/17  Interventions for Short Term Goal #2  [place] voiced with patient importance of checking feet daily.          Plan:  RN Health Coach will contact patient in the month of November and patient agrees to next outreach.   Lazaro Arms RN, BSN, [place] Direct Dial:  [phone] Fax: [phone]

## 2017-10-17 ENCOUNTER — Encounter (INDEPENDENT_AMBULATORY_CARE_PROVIDER_SITE_OTHER): Payer: Medicare Other | Admitting: Ophthalmology

## 2017-10-17 ENCOUNTER — Encounter (HOSPITAL_COMMUNITY): Payer: Medicare Other

## 2017-10-17 NOTE — Patient Outreach (Signed)
Request received from Eula Fried, [place], to mail patient personal care resources.  Information mailed today.

## 2017-10-18 ENCOUNTER — Encounter: Payer: Self-pay | Admitting: Podiatry

## 2017-10-18 ENCOUNTER — Ambulatory Visit (INDEPENDENT_AMBULATORY_CARE_PROVIDER_SITE_OTHER): Payer: Medicare Other | Admitting: Podiatry

## 2017-10-18 DIAGNOSIS — E1151 Type 2 diabetes mellitus with diabetic peripheral angiopathy without gangrene: Secondary | ICD-10-CM

## 2017-10-18 DIAGNOSIS — E1142 Type 2 diabetes mellitus with diabetic polyneuropathy: Secondary | ICD-10-CM | POA: Diagnosis not present

## 2017-10-18 DIAGNOSIS — B351 Tinea unguium: Secondary | ICD-10-CM

## 2017-10-18 NOTE — Patient Instructions (Signed)

## 2017-10-18 NOTE — Progress Notes (Signed)
Patient ID: Monique Cabrera, female   DOB: 05/04/1935, 82 y.o.   MRN: [id number]   Subjective: This patient presents today complaining of thickened and elongated toenails which she says are cough walking wearing shoes and she is requesting nail debridement. Patient was last seen for a similar service on 02/24/2016. Patient has attempted trim her nails herself, however, was unable to do so Patient is a diabetic with history of claudication Patient has history of hepatitis C Patient is a former smoker  Objective: Orientated 3 DP and PT pulses 0/4 bilaterally Capillary reflex delay bilaterally Sensation to 10 g monofilament wire intact 2/9 bilaterally Vibratory sensation reactive bilaterally Ankle reflex reactive bilaterally No open skin lesions bilaterally Atrophic skin with absent hair growth bilaterally Toenails elongated, brittle, deformed, discolored 6-10 Low-grade edema third left toe associated with history of trauma 4+ months ago HAV bilaterally Manual motor testing dorsi flexion, plantar flexion 5/5 bilaterally  Assessment: Diabetic with peripheral neuropathy and peripheral arterial disease Mycotic toenails 6-10  Plan: Debridement toenails 6-10 mechanically and electronically without any bleeding  Reappoint 3 months

## 2017-10-20 ENCOUNTER — Other Ambulatory Visit: Payer: Medicare Other

## 2017-10-20 DIAGNOSIS — N183 Chronic kidney disease, stage 3 (moderate): Principal | ICD-10-CM

## 2017-10-20 DIAGNOSIS — E785 Hyperlipidemia, unspecified: Secondary | ICD-10-CM | POA: Diagnosis not present

## 2017-10-20 DIAGNOSIS — E1169 Type 2 diabetes mellitus with other specified complication: Secondary | ICD-10-CM

## 2017-10-20 DIAGNOSIS — E1165 Type 2 diabetes mellitus with hyperglycemia: Principal | ICD-10-CM

## 2017-10-20 DIAGNOSIS — IMO0002 Reserved for concepts with insufficient information to code with codable children: Secondary | ICD-10-CM

## 2017-10-20 DIAGNOSIS — Z794 Long term (current) use of insulin: Principal | ICD-10-CM

## 2017-10-20 DIAGNOSIS — E1122 Type 2 diabetes mellitus with diabetic chronic kidney disease: Secondary | ICD-10-CM

## 2017-10-20 LAB — LIPID PANEL
Cholesterol: 159 mg/dL (ref <200)
HDL: 57 mg/dL (ref >50)
LDL Cholesterol (Calc): 84 mg/dL (calc)
Non-HDL Cholesterol (Calc): 102 mg/dL (calc) (ref <130)
Total CHOL/HDL Ratio: 2.8 (calc) (ref <5.0)
Triglycerides: 88 mg/dL (ref <150)

## 2017-10-21 LAB — COMPLETE METABOLIC PANEL WITHOUT GFR
AG Ratio: 1.3 (calc) (ref 1.0–2.5)
ALT: 10 U/L (ref 6–29)
AST: 13 U/L (ref 10–35)
Albumin: 3.9 g/dL (ref 3.6–5.1)
Alkaline phosphatase (APISO): 72 U/L (ref 33–130)
BUN/Creatinine Ratio: 16 (calc) (ref 6–22)
BUN: 23 mg/dL (ref 7–25)
CO2: 25 mmol/L (ref 20–32)
Calcium: 9.8 mg/dL (ref 8.6–10.4)
Chloride: 103 mmol/L (ref 98–110)
Creat: 1.43 mg/dL — ABNORMAL HIGH (ref 0.60–0.88)
GFR, Est African American: 40 mL/min/{1.73_m2} — ABNORMAL LOW
GFR, Est Non African American: 35 mL/min/{1.73_m2} — ABNORMAL LOW
Globulin: 2.9 g/dL (ref 1.9–3.7)
Glucose, Bld: 178 mg/dL — ABNORMAL HIGH (ref 65–99)
Potassium: 4.2 mmol/L (ref 3.5–5.3)
Sodium: 135 mmol/L (ref 135–146)
Total Bilirubin: 0.4 mg/dL (ref 0.2–1.2)
Total Protein: 6.8 g/dL (ref 6.1–8.1)

## 2017-10-21 LAB — HEMOGLOBIN A1C
Hgb A1c MFr Bld: 8.8 % of total Hgb — ABNORMAL HIGH (ref <5.7)
Mean Plasma Glucose: 206 (calc)
eAG (mmol/L): 11.4 (calc)

## 2017-10-24 ENCOUNTER — Ambulatory Visit (INDEPENDENT_AMBULATORY_CARE_PROVIDER_SITE_OTHER): Payer: Medicare Other | Admitting: Pharmacotherapy

## 2017-10-24 ENCOUNTER — Encounter: Payer: Self-pay | Admitting: Pharmacotherapy

## 2017-10-24 ENCOUNTER — Telehealth: Payer: Self-pay

## 2017-10-24 VITALS — BP 136/64 | HR 54 | Resp 12 | Ht 65.0 in | Wt 151.0 lb

## 2017-10-24 DIAGNOSIS — E1129 Type 2 diabetes mellitus with other diabetic kidney complication: Secondary | ICD-10-CM | POA: Diagnosis not present

## 2017-10-24 DIAGNOSIS — E1165 Type 2 diabetes mellitus with hyperglycemia: Secondary | ICD-10-CM | POA: Diagnosis not present

## 2017-10-24 DIAGNOSIS — N182 Chronic kidney disease, stage 2 (mild): Secondary | ICD-10-CM | POA: Diagnosis not present

## 2017-10-24 DIAGNOSIS — I10 Essential (primary) hypertension: Secondary | ICD-10-CM

## 2017-10-24 DIAGNOSIS — IMO0002 Reserved for concepts with insufficient information to code with codable children: Secondary | ICD-10-CM

## 2017-10-24 MED ORDER — LOVASTATIN 40 MG PO TABS: 40.0000 mg | ORAL_TABLET | Freq: Every day | ORAL | 1 refills | Status: DC

## 2017-10-24 MED ORDER — CLONIDINE HCL 0.1 MG PO TABS: 0.1000 mg | ORAL_TABLET | Freq: Every day | ORAL | 1 refills | Status: DC

## 2017-10-24 MED ORDER — AMLODIPINE BESYLATE 10 MG PO TABS: 10.0000 mg | ORAL_TABLET | Freq: Every day | ORAL | 1 refills | Status: DC

## 2017-10-24 NOTE — Telephone Encounter (Signed)
Discussed with patient, patient verbalized understanding of results. Patient repeated information I provided to her. Patient will take 2 20mg  tablets to equal 40 mg and when she runs out will get new rx for 40 mg.   RX sent  Patient also asked for a refill on Amlodipine, rx sent

## 2017-10-24 NOTE — Patient Instructions (Signed)
Stop the daily orange juice.  Take Humalog 6 units with each meal.

## 2017-10-24 NOTE — Telephone Encounter (Signed)
-----   Message from [name], DO sent at 10/21/2017  2:02 PM EDT ----- Bad cholesterol is improved from what it was.  Goal is less than 70 and she's now at 84.  Increase lovastatin to 40mg  from 20mg  each day.  Will need to send new Rx.  Be sure to explain very clearly to her.

## 2017-10-24 NOTE — Progress Notes (Signed)
  Subjective:    Monique Cabrera is a 82 y.o.African American female who presents for follow-up of Type 2 diabetes mellitus.   A1C: 8.8% (was 7.6%) LDL: 84 EGFR:  76m/min  She says her BG are "doing a whole lot better" She is reports average 125-140 range.  Rarely >200.  She is checking fasting only. Denies hypoglycemia. Drinking more orange juice because she is afraid of lows. Lantus 50 units once daily. Admits to missing her Humalog frequently.  No routine exercise. Wears glasses.  She is having blurry vision.  Requires a magnifying glass for reading. No peripheral edema. Denies problems with feet.  Saw podiatrist last week. Nocturia once per night.  No dysuria Needs to drink more water.  Review of Systems A comprehensive review of systems was negative except for: Eyes: positive for contacts/glasses Genitourinary: positive for nocturia Endocrine: positive for diabetic symptoms including blurry vision and skin dryness    Objective:    BP 136/64   Pulse (!) 54   Resp 12   Ht 5' 5" (1.651 m)   Wt 151 lb (68.5 kg)   BMI 25.13 kg/m   General:  alert, cooperative and no distress  Oropharynx: normal findings: lips normal without lesions and gums healthy   Eyes:  negative findings: lids and lashes normal and conjunctivae and sclerae normal   Ears:  external ears normal        Lung: clear to auscultation bilaterally  Heart:  regular rate and rhythm     Extremities: extremities normal, atraumatic, no cyanosis or edema  Skin: dry     Neuro: mental status, speech normal, alert and oriented x3 and gait and station normal   Lab Review Glucose, Bld (mg/dL)  Date Value  10/20/2017 178 (H)  07/11/2017 167 (H)  06/26/2017 209 (H)   CO2 (mmol/L)  Date Value  10/20/2017 25  07/11/2017 23  06/26/2017 20 (L)   BUN (mg/dL)  Date Value  10/20/2017 23  07/11/2017 30 (H)  06/26/2017 25 (H)  06/11/2016 21  04/14/2016 16  01/19/2016 16   Creat (mg/dL)  Date Value   10/20/2017 1.43 (H)  07/11/2017 1.65 (H)  04/07/2017 1.52 (H)   Creatinine, Ser (mg/dL)  Date Value  06/26/2017 1.84 (H)       Assessment:    Diabetes Mellitus type II, under poor control. Due to missed doses of Humalog BP close to goal <130/80   Plan:    1.  Rx changes: none  2.  Counseled on importance of taking insulin as prescribed. 3.  Humalog 6 units with each meal.  No missed doses. 4.  Continue Lantus 50 units once daily 5.  Counseled on nutrition goals.  Needs to stop the OJ frequently during the day. 6.  Counseled on benefit of routine exercise.  Goal is 30-45 minutes 5 x week. 7.  BP close to goal <130/80.

## 2017-10-27 ENCOUNTER — Ambulatory Visit (HOSPITAL_COMMUNITY)
Admission: RE | Admit: 2017-10-27 | Discharge: 2017-10-27 | Disposition: A | Discharge status: Home / Self Care | Payer: Medicare Other | Source: Ambulatory Visit | Attending: Internal Medicine | Admitting: Internal Medicine

## 2017-10-27 DIAGNOSIS — I739 Peripheral vascular disease, unspecified: Secondary | ICD-10-CM | POA: Diagnosis not present

## 2017-10-27 DIAGNOSIS — R9439 Abnormal result of other cardiovascular function study: Secondary | ICD-10-CM | POA: Diagnosis not present

## 2017-11-07 ENCOUNTER — Other Ambulatory Visit: Payer: Self-pay | Admitting: *Deleted

## 2017-11-07 DIAGNOSIS — N183 Chronic kidney disease, stage 3 (moderate): Principal | ICD-10-CM

## 2017-11-07 DIAGNOSIS — E1122 Type 2 diabetes mellitus with diabetic chronic kidney disease: Secondary | ICD-10-CM

## 2017-11-07 DIAGNOSIS — E1165 Type 2 diabetes mellitus with hyperglycemia: Principal | ICD-10-CM

## 2017-11-07 DIAGNOSIS — Z794 Long term (current) use of insulin: Principal | ICD-10-CM

## 2017-11-07 DIAGNOSIS — IMO0002 Reserved for concepts with insufficient information to code with codable children: Secondary | ICD-10-CM

## 2017-11-07 MED ORDER — INSULIN LISPRO 100 UNIT/ML (KWIKPEN): PEN_INJECTOR | SUBCUTANEOUS | 1 refills | Status: DC

## 2017-11-23 DIAGNOSIS — E1151 Type 2 diabetes mellitus with diabetic peripheral angiopathy without gangrene: Secondary | ICD-10-CM | POA: Diagnosis not present

## 2017-11-23 DIAGNOSIS — E7800 Pure hypercholesterolemia, unspecified: Secondary | ICD-10-CM | POA: Diagnosis not present

## 2017-11-23 DIAGNOSIS — I739 Peripheral vascular disease, unspecified: Secondary | ICD-10-CM | POA: Diagnosis not present

## 2017-11-23 DIAGNOSIS — R9431 Abnormal electrocardiogram [ECG] [EKG]: Secondary | ICD-10-CM | POA: Diagnosis not present

## 2017-11-23 DIAGNOSIS — Z794 Long term (current) use of insulin: Secondary | ICD-10-CM | POA: Diagnosis not present

## 2017-12-08 ENCOUNTER — Other Ambulatory Visit: Payer: Self-pay | Admitting: Internal Medicine

## 2017-12-12 DIAGNOSIS — D649 Anemia, unspecified: Secondary | ICD-10-CM | POA: Diagnosis not present

## 2017-12-12 DIAGNOSIS — I10 Essential (primary) hypertension: Secondary | ICD-10-CM | POA: Diagnosis not present

## 2017-12-12 DIAGNOSIS — E7800 Pure hypercholesterolemia, unspecified: Secondary | ICD-10-CM | POA: Diagnosis not present

## 2017-12-12 DIAGNOSIS — I739 Peripheral vascular disease, unspecified: Secondary | ICD-10-CM | POA: Diagnosis not present

## 2017-12-12 DIAGNOSIS — E119 Type 2 diabetes mellitus without complications: Secondary | ICD-10-CM | POA: Diagnosis not present

## 2017-12-12 DIAGNOSIS — Z8673 Personal history of transient ischemic attack (TIA), and cerebral infarction without residual deficits: Secondary | ICD-10-CM | POA: Diagnosis not present

## 2017-12-12 DIAGNOSIS — Z0189 Encounter for other specified special examinations: Secondary | ICD-10-CM | POA: Diagnosis not present

## 2017-12-15 ENCOUNTER — Other Ambulatory Visit: Payer: Self-pay

## 2017-12-15 NOTE — Patient Outreach (Signed)
[name]) Care Management  12/15/2017   Monique Cabrera 02/02/1935 [id number]  Subjective:  Telephone call placed to the patient for monthly outreach. HIPAA verified. The patient states that she has been feeling well. The patient denies any pain or falls.  The patient had not checked her blood sugar today but yesterday's reading was 300 one hour after eating. .[place] encouraged the patient to check her blood sugars fasting in the morning and 2 hours after eating.  The patient stated that she has not been following a diabetic diet and drinking sodas.  The patient states that she has not had any lows.  RN Health Coach how this can affect her a1c. Patient verbalized understanding  The patient had a stress test at the beginning of the week and will go tomorrow to receive the results. She also states that Dr Einar Gip changed  her cholesterol medication to atorvastatin 20 mg daily.  Th patient has an appointment scheduled with her primary care physician in January.  Objective:   Current Medications:  Current Outpatient Medications  Medication Sig Dispense Refill  . amLODipine (NORVASC) 10 MG tablet Take 1 tablet (10 mg total) by mouth daily. for high blood pressure 90 tablet 1  . atorvastatin (LIPITOR) 20 MG tablet Take 20 mg by mouth daily.    . carvedilol (COREG) 25 MG tablet TAKE 1 TABLET BY MOUTH TWICE A DAY WITH A MEAL 180 tablet 1  . cloNIDine (CATAPRES) 0.1 MG tablet Take 1 tablet (0.1 mg total) by mouth at bedtime. 90 tablet 1  . clopidogrel (PLAVIX) 75 MG tablet TAKE ONE TABLET BY MOUTH ONCE DAILY 90 tablet 2  . glucose blood (ONETOUCH VERIO) test strip Use to test blood sugar three times daily. Dx: E11.9 300 each 3  . Insulin Glargine (LANTUS SOLOSTAR) 100 UNIT/ML Solostar Pen Inject 50 units under the skin daily for Diabetes 15 pen 3  . insulin lispro (HUMALOG KWIKPEN) 100 UNIT/ML KiwkPen 6 units breakfast, 6 units lunch & supper 5 pen 1  . Insulin Pen Needle  (B-D ULTRAFINE III SHORT PEN) 31G X 8 MM MISC Use to check blood sugar every day. Dx: 11.29; 11.65 100 each 1  . valsartan-hydrochlorothiazide (DIOVAN-HCT) 320-25 MG tablet Take 1 tablet by mouth daily. 90 tablet 2  . lovastatin (MEVACOR) 40 MG tablet Take 1 tablet (40 mg total) by mouth at bedtime. For cholesterol (Patient not taking: Reported on 12/15/2017) 90 tablet 1   No current facility-administered medications for this visit.     Functional Status:  In your present state of health, do you have any difficulty performing the following activities: 09/12/2017 02/18/2017  Hearing? N N  Vision? N N  Difficulty concentrating or making decisions? N N  Walking or climbing stairs? N Y  Dressing or bathing? N N  Doing errands, shopping? N [place] and eating ? N -  Using the Toilet? N -  In the past six months, have you accidently leaked urine? N -  Do you have problems with loss of bowel control? N -  Managing your Medications? N -  Managing your Finances? N -  Housekeeping or managing your Housekeeping? N -  Some recent data might be hidden    Fall/Depression Screening: Fall Risk  12/15/2017 10/14/2017 10/06/2017  Falls in the past year? No No No  Number falls in past yr: - - -  Comment - - -  Injury with Fall? - - -  Risk for fall due to : - - -   PHQ 2/9 Scores 10/14/2017 10/06/2017 09/12/2017 09/06/2017 08/18/2017 06/10/2017 03/10/2017  PHQ - 2 Score 2 0 0 0 0 0 0  PHQ- 9 Score 3 - - - - - -    Assessment: Patient continues to benefit from health coach outreach for disease management and support.   [facility] CM Care Plan Problem One     Most Recent Value  Care Plan Problem One  Elevated A1c   Role Documenting the Problem One  [place] for Problem One  Active  [facility] Long Term Goal   Patient will decrease A1c by 0.2 points within 90 days.  [facility] Long Term Goal Start Date  09/12/17  Interventions for Problem One [place]  reenforced with patient goal of lowering A1c and diet.    [facility] CM Short Term Goal #1   Patient will report checking blood sugars prior to breakfast within 30 days.  [facility] CM Short Term Goal #1 Start Date  09/12/17  Interventions for Short Term Goal #1  RN Health Coach reenforced with patient checking blood sugar before breakfast and importance.   [facility] CM Short Term Goal #2   Patient will report checking feet daily within 30 days.   [facility] CM Short Term Goal #2 Start Date  09/12/17  Interventions for Short Term Goal #2  [place] discussed with patient importance of checking feet daily.        Plan: RN Health Coach will contact patient in the month of January  and patient agrees to next outreach.  Lazaro Arms RN, BSN, [place] Direct Dial:  [phone] Fax: [phone]

## 2017-12-16 DIAGNOSIS — E7800 Pure hypercholesterolemia, unspecified: Secondary | ICD-10-CM | POA: Diagnosis not present

## 2017-12-16 DIAGNOSIS — I739 Peripheral vascular disease, unspecified: Secondary | ICD-10-CM | POA: Diagnosis not present

## 2017-12-16 DIAGNOSIS — E1151 Type 2 diabetes mellitus with diabetic peripheral angiopathy without gangrene: Secondary | ICD-10-CM | POA: Diagnosis not present

## 2017-12-16 DIAGNOSIS — Z794 Long term (current) use of insulin: Secondary | ICD-10-CM | POA: Diagnosis not present

## 2018-01-17 ENCOUNTER — Ambulatory Visit: Payer: Medicare Other | Admitting: Podiatry

## 2018-01-18 ENCOUNTER — Other Ambulatory Visit: Payer: Self-pay

## 2018-01-18 ENCOUNTER — Other Ambulatory Visit: Payer: Self-pay | Admitting: Licensed Clinical Social Worker

## 2018-01-18 NOTE — Patient Outreach (Signed)
[place] [name]) Care Management  01/18/2018  Monique Cabrera Dec 11, 1935 [id number]  Assessment- CSW received in basket message from [place] stating that patient has had some recent transportation issues and asked I could offer any resources. CSW completed call to patient and assessed her transportation options. Patient answered and provided HIPPA verifications. CSW introduced self, reason for call and of [facility] social work services. Patient reports having transportation through [place] but that she has had difficulty reaching them to schedule arrangements lately. CSW questioned if patient was still active with [employer] and she confirmed that she was but that she does not use service. Patient reports that she would prefer a free source of transportation and does not wish to use [employer] as it cost $1.50 each way. CSW provided education on [employer] transportation and patient feels that she has this service too but is not sure. CSW completed call to [employer] and confirmed that patient is active with program and eligible to use services at anytime. Patient was educated on how to make transportation arrangements through program and was encouraged to contact program several weeks in advance to make transportation arrangements as slots fill up fast. CSW questioned if patient would like for CSW to complete home visit to provide transportation resources and instructions in person. Patient declined this and confirmed that CSW mailing instructions on how to schedule rides with [employer] will be fine. CSW will update [facility] RNCM and will not open case at this time at this time.  Eula Fried, BSW, MSW, [email or web address] Phone: [phone] Fax: [phone]

## 2018-01-18 NOTE — Patient Outreach (Signed)
[facility]) Care Management  [place]  01/18/2018   Monique Cabrera 17-Jul-1935 [id number]  Subjective: Telephone call placed to the patient for monthly assessment.  HIPAA verified. The patient states that she is doing well. She denies any pain or falls. She states that she did not check her blood sugar today but yesterday it was 145 fasting. It has been running in the 200's.  She states that she is more aware of the things she eats but is having a problem with the portion size. RN Health Coach discussed portion size and will send educational material to the patient.  She verbalized understanding. She states that she will buy a 2 liter soda and drink it all week.  RN Health Coach explained that this will also increase her blood sugar and gave her options. She verbalized understanding.   The patient states that she has not been exercising much because of her leg pain.  RN Health Coach will send some material for chair exercises.  The patient also has an appointment with her PCP on the 64 th of this month.  She has complained about the transportation that she has.  They don't contact her to follow up on her appointments making her miss them.  RN Health Coach will contact a social worker to see if the patient has any other options.  Objective:   Encounter Medications:  Outpatient Encounter Medications as of 01/18/2018  Medication Sig  . amLODipine (NORVASC) 10 MG tablet Take 1 tablet (10 mg total) by mouth daily. for high blood pressure  . atorvastatin (LIPITOR) 20 MG tablet Take 20 mg by mouth daily.  . carvedilol (COREG) 25 MG tablet TAKE 1 TABLET BY MOUTH TWICE A DAY WITH A MEAL  . cloNIDine (CATAPRES) 0.1 MG tablet Take 1 tablet (0.1 mg total) by mouth at bedtime.  . clopidogrel (PLAVIX) 75 MG tablet TAKE ONE TABLET BY MOUTH ONCE DAILY  . glucose blood (ONETOUCH VERIO) test strip Use to test blood sugar three times daily. Dx: E11.9  . Insulin Glargine (LANTUS SOLOSTAR)  100 UNIT/ML Solostar Pen Inject 50 units under the skin daily for Diabetes  . insulin lispro (HUMALOG KWIKPEN) 100 UNIT/ML KiwkPen 6 units breakfast, 6 units lunch & supper  . Insulin Pen Needle (B-D ULTRAFINE III SHORT PEN) 31G X 8 MM MISC Use to check blood sugar every day. Dx: 11.29; 11.65  . valsartan-hydrochlorothiazide (DIOVAN-HCT) 320-25 MG tablet Take 1 tablet by mouth daily.  [place] Kitchen lovastatin (MEVACOR) 40 MG tablet Take 1 tablet (40 mg total) by mouth at bedtime. For cholesterol (Patient not taking: Reported on 12/15/2017)   No facility-administered encounter medications on file as of 01/18/2018.     Functional Status:  In your present state of health, do you have any difficulty performing the following activities: 09/12/2017 02/18/2017  Hearing? N N  Vision? N N  Difficulty concentrating or making decisions? N N  Walking or climbing stairs? N Y  Dressing or bathing? N N  Doing errands, shopping? N N  Preparing Food and eating ? N -  Using the Toilet? N -  In the past six months, have you accidently leaked urine? N -  Do you have problems with loss of bowel control? N -  Managing your Medications? N -  Managing your Finances? N -  Housekeeping or managing your Housekeeping? N -  Some recent data might be hidden    Fall/Depression Screening: Fall Risk  01/18/2018 12/15/2017 10/14/2017  Falls in  the past year? No No No  Number falls in past yr: - - -  Comment - - -  Injury with Fall? - - -  Risk for fall due to : - - -   PHQ 2/9 Scores 10/14/2017 10/06/2017 09/12/2017 09/06/2017 08/18/2017 06/10/2017 03/10/2017  PHQ - 2 Score 2 0 0 0 0 0 0  PHQ- 9 Score 3 - - - - - -    Assessment: Patient continues to benefit from health coach outreach for disease management and support.   [facility] CM Care Plan Problem One     Most Recent Value  Care Plan Problem One  Elevated A1c   Role Documenting the Problem One  [place] for Problem One  Active  [facility] Long Term Goal   Patient  will decrease A1c by 0.2 points within 90 days.  [facility] Long Term Goal Start Date  09/12/17  Interventions for Problem One [place]  tealked with the patient about portion size of her foods and will send educational information to help.  [facility] CM Short Term Goal #1   Patient will report checking blood sugars prior to breakfast within 30 days.  [facility] CM Short Term Goal #1 Start Date  01/18/18  Interventions for Short Term Goal #1  RN Health Coach discussed with the patient about cheking her blood sugars at alternating times  and writing them down along with her foods eaten when high  [facility] CM Short Term Goal #2   Patient will report checking feet daily within 30 days.   [facility] CM Short Term Goal #2 Start Date  09/12/17  [facility] CM Short Term Goal #2 Met Date  01/18/18  Interventions for Short Term Goal #2  [place] discussed with patient importance of checking feet daily.         Plan:

## 2018-01-19 ENCOUNTER — Other Ambulatory Visit: Payer: Medicare Other

## 2018-01-19 NOTE — Patient Outreach (Signed)
Request received from Brooke Joyce, LCSW to mail patient personal care resources.  Information mailed today. 

## 2018-01-20 ENCOUNTER — Other Ambulatory Visit: Payer: Medicare Other

## 2018-01-20 DIAGNOSIS — E1129 Type 2 diabetes mellitus with other diabetic kidney complication: Secondary | ICD-10-CM

## 2018-01-20 DIAGNOSIS — IMO0002 Reserved for concepts with insufficient information to code with codable children: Secondary | ICD-10-CM

## 2018-01-20 DIAGNOSIS — E1165 Type 2 diabetes mellitus with hyperglycemia: Principal | ICD-10-CM

## 2018-01-21 LAB — MICROALBUMIN / CREATININE URINE RATIO
Creatinine, Urine: 74 mg/dL (ref 20–275)
Microalb Creat Ratio: 1627 mcg/mg creat — ABNORMAL HIGH (ref <30)
Microalb, Ur: 120.4 mg/dL

## 2018-01-21 LAB — COMPLETE METABOLIC PANEL WITH GFR
AG Ratio: 1.3 (calc) (ref 1.0–2.5)
ALT: 16 U/L (ref 6–29)
AST: 15 U/L (ref 10–35)
Albumin: 3.9 g/dL (ref 3.6–5.1)
Alkaline phosphatase (APISO): 85 U/L (ref 33–130)
BUN/Creatinine Ratio: 21 (calc) (ref 6–22)
BUN: 28 mg/dL — ABNORMAL HIGH (ref 7–25)
CO2: 24 mmol/L (ref 20–32)
Calcium: 10.1 mg/dL (ref 8.6–10.4)
Chloride: 106 mmol/L (ref 98–110)
Creat: 1.36 mg/dL — ABNORMAL HIGH (ref 0.60–0.88)
GFR, Est African American: 42 mL/min/{1.73_m2} — ABNORMAL LOW (ref >=60)
GFR, Est Non African American: 37 mL/min/{1.73_m2} — ABNORMAL LOW (ref >=60)
Globulin: 3 g/dL (calc) (ref 1.9–3.7)
Glucose, Bld: 254 mg/dL — ABNORMAL HIGH (ref 65–99)
Potassium: 3.9 mmol/L (ref 3.5–5.3)
Sodium: 136 mmol/L (ref 135–146)
Total Bilirubin: 0.4 mg/dL (ref 0.2–1.2)
Total Protein: 6.9 g/dL (ref 6.1–8.1)

## 2018-01-21 LAB — HEMOGLOBIN A1C
Hgb A1c MFr Bld: 8.5 % of total Hgb — ABNORMAL HIGH (ref <5.7)
Mean Plasma Glucose: 197 (calc)
eAG (mmol/L): 10.9 (calc)

## 2018-01-22 ENCOUNTER — Other Ambulatory Visit: Payer: Self-pay | Admitting: Internal Medicine

## 2018-01-23 ENCOUNTER — Encounter: Payer: Self-pay | Admitting: Pharmacotherapy

## 2018-01-23 ENCOUNTER — Ambulatory Visit (INDEPENDENT_AMBULATORY_CARE_PROVIDER_SITE_OTHER): Payer: Medicare Other | Admitting: Pharmacotherapy

## 2018-01-23 VITALS — BP 138/72 | HR 75 | Temp 98.7°F | Ht 65.0 in | Wt 151.0 lb

## 2018-01-23 DIAGNOSIS — IMO0002 Reserved for concepts with insufficient information to code with codable children: Secondary | ICD-10-CM

## 2018-01-23 DIAGNOSIS — I10 Essential (primary) hypertension: Secondary | ICD-10-CM

## 2018-01-23 DIAGNOSIS — E1129 Type 2 diabetes mellitus with other diabetic kidney complication: Secondary | ICD-10-CM | POA: Diagnosis not present

## 2018-01-23 DIAGNOSIS — N182 Chronic kidney disease, stage 2 (mild): Secondary | ICD-10-CM

## 2018-01-23 DIAGNOSIS — E1165 Type 2 diabetes mellitus with hyperglycemia: Secondary | ICD-10-CM

## 2018-01-23 NOTE — Patient Instructions (Signed)
Start to write down every time you take your Humalog so that you do not miss a dose.

## 2018-01-23 NOTE — Progress Notes (Signed)
  Subjective:    Monique Cabrera is a 83 y.o.African American female who presents for follow-up of Type 2 diabetes mellitus.   A1C: 8.5% (was 8.8%) eGFR:  9m/min SCr: 1.36  Lantus 50 units daily Humalog 6 units with each meal.  She admits to missing lunchtime dose at times.  Did not bring blood glucose meter. She reports her BG have been higher - admits to "holiday eating" Says this morning was '145mg'$ /dl.  Lowest in the morning was '95mg'$ /dl Did have a '52mg'$ /dl while in church yesterday. This was her only hypoglycemic episode since last OV.  She is trying to eat better. No routine exercise. [facility] is following her - they told her they would send her a list of chair exercise.  No peripheral edema. Wears glasses.  Denies problems with vision.  Eye exam due. Denies problems with feet Nocturia at least 1 time per night No dysuria.  Review of Systems A comprehensive review of systems was negative except for: Eyes: positive for contacts/glasses Genitourinary: positive for nocturia    Objective:    BP 138/72   Pulse 75   Temp 98.7 F (37.1 C) (Oral)   Ht '5\' 5"'$  (1.651 m)   Wt 151 lb (68.5 kg)   SpO2 98%   BMI 25.13 kg/m   General:  alert, cooperative and no distress  Oropharynx: normal findings: lips normal without lesions and gums healthy   Eyes:  negative findings: lids and lashes normal and conjunctivae and sclerae normal   Ears:  external ears normal        Lung: clear to auscultation bilaterally  Heart:  regular rate and rhythm     Extremities: extremities normal, atraumatic, no cyanosis or edema  Skin: warm and dry, no hyperpigmentation, vitiligo, or suspicious lesions     Neuro: mental status, speech normal, alert and oriented x3 and gait and station normal   Lab Review Glucose, Bld (mg/dL)  Date Value  01/20/2018 254 (H)  10/20/2017 178 (H)  07/11/2017 167 (H)   CO2 (mmol/L)  Date Value  01/20/2018 24  10/20/2017 25  07/11/2017 23   BUN (mg/dL)  Date  Value  01/20/2018 28 (H)  10/20/2017 23  07/11/2017 30 (H)  06/11/2016 21  04/14/2016 16  01/19/2016 16   Creat (mg/dL)  Date Value  01/20/2018 1.36 (H)  10/20/2017 1.43 (H)  07/11/2017 1.65 (H)       Assessment:    Diabetes Mellitus type II, under fair control. A1C above target <7% due to missing doses of Humalog BP at goal <130/80    Plan:    1.  Rx changes: none  2.  Counseled on importance of taking insulin as prescribed.  Suspect A1C and DM control will bee closer to goal if she actually takes all insulin doses as prescribed. 3.  Continue Lantus 50 units daily. 4.  Continue Humalog 6 units with each meal. 5.  Counseled on nutrition goals. 6.  Counseled on benefit of routine exercise.  Goal is 30-45 minutes 5 x week. 7.  BP at goal <130/80

## 2018-01-26 DIAGNOSIS — H524 Presbyopia: Secondary | ICD-10-CM | POA: Diagnosis not present

## 2018-01-26 DIAGNOSIS — H52201 Unspecified astigmatism, right eye: Secondary | ICD-10-CM | POA: Diagnosis not present

## 2018-01-26 DIAGNOSIS — E119 Type 2 diabetes mellitus without complications: Secondary | ICD-10-CM | POA: Diagnosis not present

## 2018-01-26 DIAGNOSIS — H5211 Myopia, right eye: Secondary | ICD-10-CM | POA: Diagnosis not present

## 2018-01-26 DIAGNOSIS — Z961 Presence of intraocular lens: Secondary | ICD-10-CM | POA: Diagnosis not present

## 2018-01-26 LAB — HM DIABETES EYE EXAM

## 2018-01-27 ENCOUNTER — Encounter: Payer: Self-pay | Admitting: *Deleted

## 2018-02-06 ENCOUNTER — Ambulatory Visit (INDEPENDENT_AMBULATORY_CARE_PROVIDER_SITE_OTHER): Payer: Medicare Other | Admitting: Internal Medicine

## 2018-02-06 ENCOUNTER — Encounter: Payer: Self-pay | Admitting: Internal Medicine

## 2018-02-06 ENCOUNTER — Ambulatory Visit (INDEPENDENT_AMBULATORY_CARE_PROVIDER_SITE_OTHER): Payer: Medicare Other

## 2018-02-06 VITALS — BP 142/70 | HR 70 | Temp 98.1°F | Ht 65.0 in | Wt 151.0 lb

## 2018-02-06 VITALS — BP 152/64 | HR 70 | Temp 98.1°F | Ht 65.0 in | Wt 151.0 lb

## 2018-02-06 DIAGNOSIS — N182 Chronic kidney disease, stage 2 (mild): Secondary | ICD-10-CM

## 2018-02-06 DIAGNOSIS — E1142 Type 2 diabetes mellitus with diabetic polyneuropathy: Secondary | ICD-10-CM

## 2018-02-06 DIAGNOSIS — Z658 Other specified problems related to psychosocial circumstances: Secondary | ICD-10-CM

## 2018-02-06 DIAGNOSIS — Z9189 Other specified personal risk factors, not elsewhere classified: Secondary | ICD-10-CM | POA: Diagnosis not present

## 2018-02-06 DIAGNOSIS — E11319 Type 2 diabetes mellitus with unspecified diabetic retinopathy without macular edema: Secondary | ICD-10-CM | POA: Diagnosis not present

## 2018-02-06 DIAGNOSIS — E1165 Type 2 diabetes mellitus with hyperglycemia: Secondary | ICD-10-CM

## 2018-02-06 DIAGNOSIS — R4589 Other symptoms and signs involving emotional state: Secondary | ICD-10-CM

## 2018-02-06 DIAGNOSIS — Z0000 Encounter for general adult medical examination without abnormal findings: Secondary | ICD-10-CM | POA: Diagnosis not present

## 2018-02-06 DIAGNOSIS — E785 Hyperlipidemia, unspecified: Secondary | ICD-10-CM

## 2018-02-06 DIAGNOSIS — IMO0002 Reserved for concepts with insufficient information to code with codable children: Secondary | ICD-10-CM

## 2018-02-06 DIAGNOSIS — R110 Nausea: Secondary | ICD-10-CM

## 2018-02-06 DIAGNOSIS — E1169 Type 2 diabetes mellitus with other specified complication: Secondary | ICD-10-CM

## 2018-02-06 DIAGNOSIS — E1129 Type 2 diabetes mellitus with other diabetic kidney complication: Secondary | ICD-10-CM

## 2018-02-06 DIAGNOSIS — Z5982 Transportation insecurity: Secondary | ICD-10-CM

## 2018-02-06 NOTE — Patient Instructions (Signed)
[name] , Thank you for taking time to come for your Medicare Wellness Visit. I appreciate your ongoing commitment to your health goals. Please review the following plan we discussed and let me know if I can assist you in the future.   Screening recommendations/referrals: Colonoscopy up to date, you are over age 81 Mammogram up to date, you are over age 34 Bone Density up to date Recommended yearly ophthalmology/optometry visit for glaucoma screening and checkup Recommended yearly dental visit for hygiene and checkup  Vaccinations: Influenza vaccine up to date, due 2019 fall season Pneumococcal vaccine up to date Tdap vaccine due, declined Shingles vaccine due, declined    Advanced directives: Advance directive discussed with you today. I have provided a copy for you to complete at home and have notarized. Once this is complete please bring a copy in to our office so we can scan it into your chart.  Conditions/risks identified: none  Next appointment: [name], RN 02/07/2019 9:15am   Preventive Care 65 Years and Older, Female Preventive care refers to lifestyle choices and visits with your health care provider that can promote health and wellness. What does preventive care include?  A yearly physical exam. This is also called an annual well check.  Dental exams once or twice a year.  Routine eye exams. Ask your health care provider how often you should have your eyes checked.  Personal lifestyle choices, including:  Daily care of your teeth and gums.  Regular physical activity.  Eating a healthy diet.  Avoiding tobacco and drug use.  Limiting alcohol use.  Practicing safe sex.  Taking low-dose aspirin every day.  Taking vitamin and mineral supplements as recommended by your health care provider. What happens during an annual well check? The services and screenings done by your health care provider during your annual well check will depend on your age, overall  health, lifestyle risk factors, and family history of disease. Counseling  Your health care provider may ask you questions about your:  Alcohol use.  Tobacco use.  Drug use.  Emotional well-being.  Home and relationship well-being.  Sexual activity.  Eating habits.  History of falls.  Memory and ability to understand (cognition).  Work and work Statistician.  Reproductive health. Screening  You may have the following tests or measurements:  Height, weight, and BMI.  Blood pressure.  Lipid and cholesterol levels. These may be checked every 5 years, or more frequently if you are over 8 years old.  Skin check.  Lung cancer screening. You may have this screening every year starting at age 51 if you have a 30-pack-year history of smoking and currently smoke or have quit within the past 15 years.  Fecal occult blood test (FOBT) of the stool. You may have this test every year starting at age 56.  Flexible sigmoidoscopy or colonoscopy. You may have a sigmoidoscopy every 5 years or a colonoscopy every 10 years starting at age 62.  Hepatitis C blood test.  Hepatitis B blood test.  Sexually transmitted disease (STD) testing.  Diabetes screening. This is done by checking your blood sugar (glucose) after you have not eaten for a while (fasting). You may have this done every 1-3 years.  Bone density scan. This is done to screen for osteoporosis. You may have this done starting at age 58.  Mammogram. This may be done every 1-2 years. Talk to your health care provider about how often you should have regular mammograms. Talk with your health care provider  about your test results, treatment options, and if necessary, the need for more tests. Vaccines  Your health care provider may recommend certain vaccines, such as:  Influenza vaccine. This is recommended every year.  Tetanus, diphtheria, and acellular pertussis (Tdap, Td) vaccine. You may need a Td booster every 10  years.  Zoster vaccine. You may need this after age 73.  Pneumococcal 13-valent conjugate (PCV13) vaccine. One dose is recommended after age 51.  Pneumococcal polysaccharide (PPSV23) vaccine. One dose is recommended after age 53. Talk to your health care provider about which screenings and vaccines you need and how often you need them. This information is not intended to replace advice given to you by your health care provider. Make sure you discuss any questions you have with your health care provider. Document Released: 01/09/2016 Document Revised: 09/01/2016 Document Reviewed: 10/14/2015 Elsevier Interactive Patient Education  2017 [place] Prevention in the Home Falls can cause injuries. They can happen to people of all ages. There are many things you can do to make your home safe and to help prevent falls. What can I do on the outside of my home?  Regularly fix the edges of walkways and driveways and fix any cracks.  Remove anything that might make you trip as you walk through a door, such as a raised step or threshold.  Trim any bushes or trees on the path to your home.  Use bright outdoor lighting.  Clear any walking paths of anything that might make someone trip, such as rocks or tools.  Regularly check to see if handrails are loose or broken. Make sure that both sides of any steps have handrails.  Any raised decks and porches should have guardrails on the edges.  Have any leaves, snow, or ice cleared regularly.  Use sand or salt on walking paths during winter.  Clean up any spills in your garage right away. This includes oil or grease spills. What can I do in the bathroom?  Use night lights.  Install grab bars by the toilet and in the tub and shower. Do not use towel bars as grab bars.  Use non-skid mats or decals in the tub or shower.  If you need to sit down in the shower, use a plastic, non-slip stool.  Keep the floor dry. Clean up any water that  spills on the floor as soon as it happens.  Remove soap buildup in the tub or shower regularly.  Attach bath mats securely with double-sided non-slip rug tape.  Do not have throw rugs and other things on the floor that can make you trip. What can I do in the bedroom?  Use night lights.  Make sure that you have a light by your bed that is easy to reach.  Do not use any sheets or blankets that are too big for your bed. They should not hang down onto the floor.  Have a firm chair that has side arms. You can use this for support while you get dressed.  Do not have throw rugs and other things on the floor that can make you trip. What can I do in the kitchen?  Clean up any spills right away.  Avoid walking on wet floors.  Keep items that you use a lot in easy-to-reach places.  If you need to reach something above you, use a strong step stool that has a grab bar.  Keep electrical cords out of the way.  Do not use floor polish or  wax that makes floors slippery. If you must use wax, use non-skid floor wax.  Do not have throw rugs and other things on the floor that can make you trip. What can I do with my stairs?  Do not leave any items on the stairs.  Make sure that there are handrails on both sides of the stairs and use them. Fix handrails that are broken or loose. Make sure that handrails are as long as the stairways.  Check any carpeting to make sure that it is firmly attached to the stairs. Fix any carpet that is loose or worn.  Avoid having throw rugs at the top or bottom of the stairs. If you do have throw rugs, attach them to the floor with carpet tape.  Make sure that you have a light switch at the top of the stairs and the bottom of the stairs. If you do not have them, ask someone to add them for you. What else can I do to help prevent falls?  Wear shoes that:  Do not have high heels.  Have rubber bottoms.  Are comfortable and fit you well.  Are closed at the  toe. Do not wear sandals.  If you use a stepladder:  Make sure that it is fully opened. Do not climb a closed stepladder.  Make sure that both sides of the stepladder are locked into place.  Ask someone to hold it for you, if possible.  Clearly mark and make sure that you can see:  Any grab bars or handrails.  First and last steps.  Where the edge of each step is.  Use tools that help you move around (mobility aids) if they are needed. These include:  Canes.  Walkers.  Scooters.  Crutches.  Turn on the lights when you go into a dark area. Replace any light bulbs as soon as they burn out.  Set up your furniture so you have a clear path. Avoid moving your furniture around.  If any of your floors are uneven, fix them.  If there are any pets around you, be aware of where they are.  Review your medicines with your doctor. Some medicines can make you feel dizzy. This can increase your chance of falling. Ask your doctor what other things that you can do to help prevent falls. This information is not intended to replace advice given to you by your health care provider. Make sure you discuss any questions you have with your health care provider. Document Released: 10/09/2009 Document Revised: 05/20/2016 Document Reviewed: 01/17/2015 Elsevier Interactive Patient Education  2017 [employer].

## 2018-02-06 NOTE — Patient Instructions (Addendum)
Start doing the walks to the mailbox.  Do some of the [facility] chair exercises.   Try to do better about eating early in the morning to see if that helps your nauseated.   You can get a diabetes medical ID from the american diabetes association. You will hear from [facility] or one of her colleagues about your financial situation and transportation.

## 2018-02-06 NOTE — Progress Notes (Signed)
Location:  [facility] clinic Provider:  Kaspar Albornoz L. Mariea Clonts, D.O., C.M.D.  Code Status: full code--needs discussion Goals of Care:  Advanced Directives 02/06/2018  Does Patient Have a Medical Advance Directive? No  Would patient like information on creating a medical advance directive? No - Patient declined  Pre-existing out of facility DNR order (yellow form or pink MOST form) -     Chief Complaint  Patient presents with  . Medical Management of Chronic Issues    34mth follow-up    HPI: Patient is a 83 y.o. female seen today for medical management of chronic diseases.    She feels lonesome.  She hates to be alone. Worries about something happening to her at night.  Says her children don't come enough.  Says it's hard to get transportation and having trouble paying for food.  Feels bored.  Talked about retirement community, but does like to have her own space.  Says one of her daughters is talking about getting a new place and she'd like to have her own space there.   Goes to church on wed and Sunday and loves to be able to get out and leave her home.  Will have days where she doesn't get to talk to or see anyone.    Her check is being reduced and till she pays her rent, she has hardly any money left for bills, food, etc.  She says it said that her part b would no longer be covered?  Gets food stamps and $17.  Also does not have transportation for appts.  Cannot drive due to her visual decline and stroke she had.  29/30 on mmse.    Needs foot exam.  She is writing down each time she's taking her humalog.  Sometimes she'll eat and not write it and then forget when it was she took it.  hba1c down to 8.5 from 8.8.  She is trying not to miss doses of insulin.  She is walking, too when the weather is good.  She walked all the way to the mailbox and back. Did not have to sit and rest.  Then walked to the dumpster and put her trash out and walked back.    Has morning nausea--counseled on need to eat.     Past Medical History:  Diagnosis Date  . Acute upper respiratory infections of unspecified site   . Anemia   . Anemia, unspecified   . Atherosclerosis of native arteries of the extremities, unspecified   . Chest pain, unspecified   . Chronic kidney disease (CKD), stage II (mild)   . Diabetes mellitus   . Diarrhea   . Disorder of bone and cartilage, unspecified   . DM (diabetes mellitus) type II controlled with renal manifestation ([place])   . Herpes zoster with other nervous system complications(053.19)   . Hypercalcemia   . Hypertension   . Hypertensive renal disease, benign   . Nonspecific reaction to tuberculin skin test without active tuberculosis(795.51)   . Nonspecific tuberculin test reaction   . Other and unspecified hyperlipidemia   . Pain in joint, lower leg   . Peripheral arterial disease ([place])   . Postherpetic neuralgia   . Proteinuria   . Stroke ([place]) 01/2017  . Type II or unspecified type diabetes mellitus with renal manifestations, not stated as uncontrolled(250.40)   . Type II or unspecified type diabetes mellitus with renal manifestations, uncontrolled(250.42)   . Unspecified disorder of kidney and ureter   . Unspecified essential hypertension  Past Surgical History:  Procedure Laterality Date  . hysterectomy    . INCISION AND DRAINAGE Left 05/27/14   sebacous cyst, ear  . PRP Left    Dr. Ricki Miller  . removal of cyst from hand    . removal of tumor from foot    . TONSILLECTOMY      Allergies  Allergen Reactions  . Invokana [Canagliflozin] Other (See Comments)    Vagina itching and swelling Vaginal Itching and irritation   . Jardiance [Empagliflozin] Itching and Other (See Comments)    Vaginal itching and swelling    Outpatient Encounter Medications as of 02/06/2018  Medication Sig  . amLODipine (NORVASC) 10 MG tablet Take 1 tablet (10 mg total) by mouth daily. for high blood pressure  . atorvastatin (LIPITOR) 20 MG tablet Take 20 mg by mouth  daily.  . carvedilol (COREG) 25 MG tablet TAKE 1 TABLET BY MOUTH TWICE A DAY WITH A MEAL  . cloNIDine (CATAPRES) 0.1 MG tablet Take 1 tablet (0.1 mg total) by mouth at bedtime.  . clopidogrel (PLAVIX) 75 MG tablet TAKE ONE TABLET BY MOUTH ONCE DAILY  . glucose blood (ONETOUCH VERIO) test strip Use to test blood sugar three times daily. Dx: E11.9  . Insulin Glargine (LANTUS SOLOSTAR) 100 UNIT/ML Solostar Pen Inject 50 units under the skin daily for Diabetes  . insulin lispro (HUMALOG KWIKPEN) 100 UNIT/ML KiwkPen 6 units breakfast, 6 units lunch & supper  . Insulin Pen Needle (B-D ULTRAFINE III SHORT PEN) 31G X 8 MM MISC Use to check blood sugar every day. Dx: 11.29; 11.65  . valsartan-hydrochlorothiazide (DIOVAN-HCT) 320-25 MG tablet Take 1 tablet by mouth daily.   No facility-administered encounter medications on file as of 02/06/2018.     Review of Systems:  Review of Systems  Constitutional: Positive for malaise/fatigue. Negative for chills and fever.  HENT: Positive for hearing loss. Negative for congestion.   Eyes: Positive for blurred vision.  Respiratory: Negative for cough and shortness of breath.   Cardiovascular: Negative for chest pain, palpitations and leg swelling.  Gastrointestinal: Negative for abdominal pain, blood in stool, constipation and melena.  Genitourinary: Negative for dysuria.  Musculoskeletal: Negative for falls and joint pain.  Neurological: Positive for tingling and sensory change. Negative for dizziness and loss of consciousness.  Psychiatric/Behavioral: Positive for memory loss. The patient is nervous/anxious.        Loneliness    Health Maintenance  Topic Date Due  . FOOT EXAM  06/11/2017  . HEMOGLOBIN A1C  07/20/2018  . OPHTHALMOLOGY EXAM  01/26/2019  . INFLUENZA VACCINE  Completed  . DEXA SCAN  Completed  . PNA vac Low Risk Adult  Completed    Physical Exam: Vitals:   02/06/18 1039  BP: (!) 152/64  Pulse: 70  Temp: 98.1 F (36.7 C)    TempSrc: Oral  SpO2: 95%  Weight: 151 lb (68.5 kg)  Height: 5\' 5"  (1.651 m)   Body mass index is 25.13 kg/m. Physical Exam  Constitutional: She is oriented to person, place, and time. She appears well-developed and well-nourished. No distress.  Cardiovascular: Normal rate, regular rhythm, normal heart sounds and intact distal pulses.  Pulmonary/Chest: Effort normal and breath sounds normal. No respiratory distress.  Abdominal: Soft. Bowel sounds are normal. She exhibits no distension. There is no tenderness.  Musculoskeletal: Normal range of motion.  Neurological: She is alert and oriented to person, place, and time. A sensory deficit is present. No cranial nerve deficit.  Diabetic Foot Exam -  Simple   Simple Foot Form Diabetic Foot exam was performed with the following findings:  Yes  02/06/2018 12:30 PM  Visual Inspection No deformities, no ulcerations, no other skin breakdown bilaterally:  Yes Sensation Testing See comments:  Yes Pulse Check See comments:  Yes Comments Diminished pulses of bilateral LEs--DP and PT pulses; fungal toenails,  diminished sensation in great toes     Skin: Skin is warm and dry. Capillary refill takes less than 2 seconds.  Psychiatric: She has a normal mood and affect.    Labs reviewed: Basic Metabolic Panel: Recent Labs    07/11/17 0831 10/20/17 0928 01/20/18 0906  NA 138 135 136  K 4.1 4.2 3.9  CL 106 103 106  CO2 23 25 24   GLUCOSE 167* 178* 254*  BUN 30* 23 28*  CREATININE 1.65* 1.43* 1.36*  CALCIUM 9.8 9.8 10.1   Liver Function Tests: Recent Labs    02/19/17 0349 04/07/17 0915 07/11/17 0831 10/20/17 0928 01/20/18 0906  AST 15 17 16 13 15   ALT 10* 17 17 10 16   ALKPHOS 53 57 72  --   --   BILITOT 0.5 0.4 0.4 0.4 0.4  PROT 6.3* 6.7 6.5 6.8 6.9  ALBUMIN 3.3* 3.8 3.7  --   --    No results for input(s): LIPASE, AMYLASE in the last 8760 hours. No results for input(s): AMMONIA in the last 8760 hours. CBC: Recent Labs     02/17/17 0915 02/18/17 1230 02/19/17 0349 06/26/17 2252  WBC 8.1  --  6.2 7.0  NEUTROABS 5.3  --   --   --   HGB 11.7*  --  10.9* 10.4*  HCT 34.1* 35.8 32.3* 30.3*  MCV 89.0  --  88.5 89.4  PLT 222  --  228 218   Lipid Panel: Recent Labs    02/17/17 2355 10/20/17 0928  CHOL 187 159  HDL 61 57  LDLCALC 102*  --   TRIG 121 88  CHOLHDL 3.1 2.8   Lab Results  Component Value Date   HGBA1C 8.5 (H) 01/20/2018    Assessment/Plan 1. Type II diabetes mellitus with renal manifestations, uncontrolled (HCC) -slight improvement in hba1c with more consistent use of humalog--cont this and recording when used -try to eat healthier foods, but apparently cost is now an issue with change in social security check--C3 referral was placed   2. Chronic kidney disease (CKD), stage II (mild) -renal function stable, hydrate, monitor  3. Diabetic retinopathy of both eyes associated with type 2 diabetes mellitus, macular edema presence unspecified, unspecified retinopathy severity ([place]) -ongoing, cont regular ophtho visits, no driving  4. Hyperlipidemia associated with type 2 diabetes mellitus ([place]) -cont statin therapy and work on diet and increase exercise to help this and lower sugar and bp  5. Loneliness -would benefit from AL or at least improved transportation resources--C3 referral placed  6. Diabetic polyneuropathy associated with type 2 diabetes mellitus (HCC) -cont lantus with humalog meal coverage, statin, ARB, improved diet and exercise program, monitoring with Dr. Tivis Ringer  7. Nausea without vomiting -my suspicion is that this is due to inadequate intake early mornings  Labs/tests ordered:  No orders of the defined types were placed in this encounter. ordered by [facility]  Next appt:  04/20/2018--keep regular visits, f/u with me in 4 mos  Jillianne Gamino L. Jennifier Smitherman, D.O. [facility] [address], [place] [address] Cell Phone  (Mon-Fri 8am-5pm):  [phone] On Call:  [phone] &  follow prompts after 5pm & weekends Office Phone:  [phone] Office Fax:  [phone]

## 2018-02-06 NOTE — Progress Notes (Signed)
Subjective:   Monique Cabrera is a 83 y.o. female who presents for Medicare Annual (Subsequent) preventive examination.  Last AWV 01/06/2017     Objective:     Vitals: BP (!) 142/70 (BP Location: Right Arm, Patient Position: Sitting)   Pulse 70   Temp 98.1 F (36.7 C) (Oral)   Ht 5\' 5"  (1.651 m)   Wt 151 lb (68.5 kg)   SpO2 95%   BMI 25.13 kg/m   Body mass index is 25.13 kg/m.  Advanced Directives 02/06/2018 08/18/2017 07/07/2017 06/27/2017 06/26/2017 06/10/2017 02/17/2017  Does Patient Have a Medical Advance Directive? No No No No No No No  Would patient like information on creating a medical advance directive? Yes (MAU/Ambulatory/Procedural Areas - Information given) Yes (MAU/Ambulatory/Procedural Areas - Information given) - - - No - Patient declined -  Pre-existing out of facility DNR order (yellow form or pink MOST form) - - - - - - -    Tobacco Social History   Tobacco Use  Smoking Status Former Smoker  . Types: Cigarettes  Smokeless Tobacco Never Used  Tobacco Comment   Quit about age 8      Counseling given: Not Answered Comment: Quit about age 63    Clinical Intake:  Pre-visit preparation completed: No  Pain : No/denies pain     Nutritional Risks: None Diabetes: Yes CBG done?: No Did pt. bring in CBG monitor from home?: No  How often do you need to have someone help you when you read instructions, pamphlets, or other written materials from your doctor or pharmacy?: 1 - Never What is the last grade level you completed in school?: [employer]  Interpreter Needed?: No  Information entered by :: [name], RN  Past Medical History:  Diagnosis Date  . Acute upper respiratory infections of unspecified site   . Anemia   . Anemia, unspecified   . Atherosclerosis of native arteries of the extremities, unspecified   . Chest pain, unspecified   . Chronic kidney disease (CKD), stage II (mild)   . Diabetes mellitus   . Diarrhea   . Disorder of bone and  cartilage, unspecified   . DM (diabetes mellitus) type II controlled with renal manifestation ([place])   . Herpes zoster with other nervous system complications(053.19)   . Hypercalcemia   . Hypertension   . Hypertensive renal disease, benign   . Nonspecific reaction to tuberculin skin test without active tuberculosis(795.51)   . Nonspecific tuberculin test reaction   . Other and unspecified hyperlipidemia   . Pain in joint, lower leg   . Peripheral arterial disease ([place])   . Postherpetic neuralgia   . Proteinuria   . Stroke ([place]) 01/2017  . Type II or unspecified type diabetes mellitus with renal manifestations, not stated as uncontrolled(250.40)   . Type II or unspecified type diabetes mellitus with renal manifestations, uncontrolled(250.42)   . Unspecified disorder of kidney and ureter   . Unspecified essential hypertension    Past Surgical History:  Procedure Laterality Date  . hysterectomy    . INCISION AND DRAINAGE Left 05/27/14   sebacous cyst, ear  . PRP Left    Dr. Ricki Miller  . removal of cyst from hand    . removal of tumor from foot    . TONSILLECTOMY     Family History  Problem Relation Age of Onset  . Diabetes Mother   . Diabetes Father   . Diabetes Sister   . Diabetes Sister    Social History  Socioeconomic History  . Marital status: Widowed    Spouse name: None  . Number of children: 6  . Years of education: 26  . Highest education level: None  Social Needs  . Financial resource strain: Not hard at all  . Food insecurity - worry: Never true  . Food insecurity - inability: Never true  . Transportation needs - medical: Yes  . Transportation needs - non-medical: Yes  Occupational History    Comment: retired, [employer]  Tobacco Use  . Smoking status: Former Smoker    Types: Cigarettes  . Smokeless tobacco: Never Used  . Tobacco comment: Quit about age 85   Substance and Sexual Activity  . Alcohol use: No    Alcohol/week: 0.0 oz  . Drug use: No  . Sexual  activity: No  Other Topics Concern  . None  Social History Narrative   Lives alone, son there occass   Caffeine- coffee 2-3 cups daily, soda off and on    Outpatient Encounter Medications as of 02/06/2018  Medication Sig  . amLODipine (NORVASC) 10 MG tablet Take 1 tablet (10 mg total) by mouth daily. for high blood pressure  . atorvastatin (LIPITOR) 20 MG tablet Take 20 mg by mouth daily.  . carvedilol (COREG) 25 MG tablet TAKE 1 TABLET BY MOUTH TWICE A DAY WITH A MEAL  . cloNIDine (CATAPRES) 0.1 MG tablet Take 1 tablet (0.1 mg total) by mouth at bedtime.  . clopidogrel (PLAVIX) 75 MG tablet TAKE ONE TABLET BY MOUTH ONCE DAILY  . glucose blood (ONETOUCH VERIO) test strip Use to test blood sugar three times daily. Dx: E11.9  . Insulin Glargine (LANTUS SOLOSTAR) 100 UNIT/ML Solostar Pen Inject 50 units under the skin daily for Diabetes  . insulin lispro (HUMALOG KWIKPEN) 100 UNIT/ML KiwkPen 6 units breakfast, 6 units lunch & supper  . Insulin Pen Needle (B-D ULTRAFINE III SHORT PEN) 31G X 8 MM MISC Use to check blood sugar every day. Dx: 11.29; 11.65  . valsartan-hydrochlorothiazide (DIOVAN-HCT) 320-25 MG tablet Take 1 tablet by mouth daily.   No facility-administered encounter medications on file as of 02/06/2018.     Activities of Daily Living In your present state of health, do you have any difficulty performing the following activities: 02/06/2018 09/12/2017  Hearing? N N  Vision? N N  Difficulty concentrating or making decisions? Y N  Walking or climbing stairs? Y N  Dressing or bathing? N N  Doing errands, shopping? Y N  Preparing Food and eating ? N N  Using the Toilet? N N  In the past six months, have you accidently leaked urine? N N  Do you have problems with loss of bowel control? N N  Managing your Medications? N N  Managing your Finances? N N  Housekeeping or managing your Housekeeping? N N  Some recent data might be hidden    Patient Care Team: [name], DO  as PCP - General (Geriatric Medicine) Estanislado Emms, MD as Consulting Physician (Nephrology) Adrian Prows, MD as Consulting Physician (Cardiology) Lazaro Arms, RN as [place] Management Rutherford Guys, MD as Consulting Physician (Ophthalmology)    Assessment:   This is a routine wellness examination for [place].  Exercise Activities and Dietary recommendations Current Exercise Habits: The patient does not participate in regular exercise at present, Exercise limited by: orthopedic condition(s)  Goals    . Exercise 3x per week (30 min per time)     Patient would like to do chair  exercises and walk 2-3 times a week.    . Weight (lb) < 135 lb (61.2 kg)     Starting 01/06/2017, I will attempt to decrease my current weight of 147 lb to my goal weight of 135 lb.        Fall Risk Fall Risk  02/06/2018 01/18/2018 12/15/2017 10/14/2017 10/06/2017  Falls in the past year? No No No No No  Number falls in past yr: - - - - -  Comment - - - - -  Injury with Fall? - - - - -  Risk for fall due to : - - - - -   Is the patient's home free of loose throw rugs in walkways, pet beds, electrical cords, etc?   yes      Grab bars in the bathroom? yes      Handrails on the stairs?   yes      Adequate lighting?   yes  Timed Get Up and Go performed: 20 seconds, fall risk  Depression Screen PHQ 2/9 Scores 02/06/2018 10/14/2017 10/06/2017 09/12/2017  PHQ - 2 Score 0 2 0 0  PHQ- 9 Score - 3 - -     Cognitive Function MMSE - Mini Mental State Exam 02/06/2018 01/06/2017  Not completed: (No Data) -  Orientation to time 5 5  Orientation to Place 5 5  Registration 3 3  Attention/ Calculation 5 5  Recall 2 3  Language- name 2 objects 2 2  Language- repeat 1 1  Language- follow 3 step command 3 3  Language- read & follow direction 1 1  Write a sentence 1 1  Copy design 1 1  Total score 29 30        Immunization History  Administered Date(s) Administered  . Influenza,inj,Quad  PF,6+ Mos 09/24/2013, 09/19/2014, 09/13/2016  . Influenza-Unspecified 11/22/2009, 10/01/2011, 09/10/2015, 09/26/2017  . PPD Test 01/12/2010  . Pneumococcal Conjugate-13 11/24/2015  . Pneumococcal Polysaccharide-23 12/27/2005    Qualifies for Shingles Vaccine? Yes, educated and declined  Screening Tests Health Maintenance  Topic Date Due  . FOOT EXAM  06/11/2017  . HEMOGLOBIN A1C  07/20/2018  . OPHTHALMOLOGY EXAM  01/26/2019  . INFLUENZA VACCINE  Completed  . DEXA SCAN  Completed  . PNA vac Low Risk Adult  Completed    Cancer Screenings: Lung: Low Dose CT Chest recommended if Age 65-80 years, 30 pack-year currently smoking OR have quit w/in 15years. Patient does not qualify. Breast:  Up to date on Mammogram? Yes   Up to date of Bone Density/Dexa? Yes Colorectal: up to date  Additional Screenings:  Hepatitis B/HIV/Syphillis:Not indicated Hepatitis C Screening: Declined     Plan:    I have personally reviewed and addressed the Medicare Annual Wellness questionnaire and have noted the following in the patient's chart:  A. Medical and social history B. Use of alcohol, tobacco or illicit drugs  C. Current medications and supplements D. Functional ability and status E.  Nutritional status F.  Physical activity G. Advance directives H. List of other physicians I.  Hospitalizations, surgeries, and ER visits in previous 12 months J.  [place] to include hearing, vision, cognitive, depression L. Referrals and appointments -C3  In addition, I have reviewed and discussed with patient certain preventive protocols, quality metrics, and best practice recommendations. A written personalized care plan for preventive services as well as general preventive health recommendations were provided to patient.  See attached scanned questionnaire for additional information.   Signed,  [name], RN Nurse Health Advisor   Quick Notes   Health Maintenance: Foot exam due.  TDAP and shingrix due and declined.     Abnormal Screen: BP 142/70.  MMSE 29/30, passed clock drawing     Patient Concerns: C3 referral for lack of transportation and trouble paying for food Nauseous in the mornings     Nurse Concerns: None

## 2018-02-17 ENCOUNTER — Other Ambulatory Visit: Payer: Self-pay

## 2018-02-17 ENCOUNTER — Other Ambulatory Visit: Payer: Self-pay | Admitting: Licensed Clinical Social Worker

## 2018-02-17 NOTE — Patient Outreach (Signed)
[name]) Care Management  02/17/2018  Monique Cabrera [birth year] [id number]  Assessment- CSW received new referral on patient for food assistance resources. CSW completed initial outreach call to patient and patient successfully answered. HIPPA verifications were provided. CSW introduced self, reason for call and of [facility] social work services. Patient admits to recent difficultly with affording food. Patient receives $17 in food stamps per month. Patient is also active with [employer] which is a program that delivers groceries (1-2 times per month) directly to patient's home. Patient reports that she has been active with program for some time now but that they have not been able to provide as much groceries as they use to and she is unsure why. Patient states that she is very thankful for what program provides her so she has not questioned why there has been a decrease in in the food she receives. Patient prefers to gain other food resources outside of [employer]. CSW provided education on available food pantries within [facility] as well as their requirements. CSW also provided education on where to get free meals daily within the community. Patient denies wanting Mobile Meals as she will not be eligible for [employer] program if she is active with them. Patient is agreeable to CSW mailing out all food assistance resources discussed today. Patient denies having any other social work needs and was very Patent attorney of phone call and resource education. CSW will mail requested resources and update [place].  Plan- CSW will send request to [place] [employer] to mail out food assistance resources as well as financial resources. CSW will not open case at this time.  Eula Fried, BSW, MSW, [email or web address] Phone: [phone] Fax: [phone]

## 2018-02-17 NOTE — Patient Outreach (Signed)
[place] [facility]) Care Management  [place]  02/17/2018   Monique Cabrera 12-08-1935 [id number]  Subjective: Telephone call to the patient for monthly assessment. HIPAA verified.  The patient states that she is feeling well.  She denies any pain or falls.  She states that her blood sugars have been running a little high but better than they were.  Yesterday it was 145.  Her blood sugars have been running between 125-140.  The patient has been forgetting to take her insulin on a regular basis.  RN Health Coach talked with the patient about her insulin and the effects it has on her blood sugars and health.  The patient verbalized understanding. The patient also stated that she is having trouble paying for her food.  RN Health Coach will send a referral to Social Work to help with food resources.  The patient saw her physician on 02-06-2018 and her a1c has went down from  8.8 to 8.5.  The patient was very excited that she has been able to exercise more by walking further than she has been able to.  She said she will not over do it but will walk at least three times a week.  I congratulated the patient on a good job that she has done and encouraged her to keep up the good work.  She has her next appointment on April 25 th with he primary care.     Encounter Medications:  Outpatient Encounter Medications as of 02/17/2018  Medication Sig  . amLODipine (NORVASC) 10 MG tablet Take 1 tablet (10 mg total) by mouth daily. for high blood pressure  . atorvastatin (LIPITOR) 20 MG tablet Take 20 mg by mouth daily.  . carvedilol (COREG) 25 MG tablet TAKE 1 TABLET BY MOUTH TWICE A DAY WITH A MEAL  . cloNIDine (CATAPRES) 0.1 MG tablet Take 1 tablet (0.1 mg total) by mouth at bedtime.  . clopidogrel (PLAVIX) 75 MG tablet TAKE ONE TABLET BY MOUTH ONCE DAILY  . glucose blood (ONETOUCH VERIO) test strip Use to test blood sugar three times daily. Dx: E11.9  . Insulin Glargine (LANTUS SOLOSTAR) 100  UNIT/ML Solostar Pen Inject 50 units under the skin daily for Diabetes  . insulin lispro (HUMALOG KWIKPEN) 100 UNIT/ML KiwkPen 6 units breakfast, 6 units lunch & supper  . Insulin Pen Needle (B-D ULTRAFINE III SHORT PEN) 31G X 8 MM MISC Use to check blood sugar every day. Dx: 11.29; 11.65  . valsartan-hydrochlorothiazide (DIOVAN-HCT) 320-25 MG tablet Take 1 tablet by mouth daily.   No facility-administered encounter medications on file as of 02/17/2018.     Functional Status:  In your present state of health, do you have any difficulty performing the following activities: 02/06/2018 09/12/2017  Hearing? N N  Vision? N N  Difficulty concentrating or making decisions? Y N  Walking or climbing stairs? Y N  Dressing or bathing? N N  Doing errands, shopping? Y N  Preparing Food and eating ? N N  Using the Toilet? N N  In the past six months, have you accidently leaked urine? N N  Do you have problems with loss of bowel control? N N  Managing your Medications? N N  Managing your Finances? N N  Housekeeping or managing your Housekeeping? N N  Some recent data might be hidden    Fall/Depression Screening: Fall Risk  02/17/2018 02/06/2018 01/18/2018  Falls in the past year? No No No  Number falls in past yr: - - -  Comment - - -  Injury with Fall? - - -  Risk for fall due to : - - -   PHQ 2/9 Scores 02/06/2018 10/14/2017 10/06/2017 09/12/2017 09/06/2017 08/18/2017 06/10/2017  PHQ - 2 Score 0 2 0 0 0 0 0  PHQ- 9 Score - 3 - - - - -    Assessment: Patient continues to benefit from health coach outreach for disease management and support.     Plan: RN Health Coach will contact patient in the month of March and patient agrees to next outreach.           RN Health Coach will make a referral to social work for food resources.

## 2018-02-20 NOTE — Patient Outreach (Signed)
Request received from Brooke Joyce, LCSW to mail patient personal care resources.  Information mailed today. 

## 2018-03-24 ENCOUNTER — Other Ambulatory Visit: Payer: Self-pay

## 2018-03-24 NOTE — Patient Outreach (Signed)
[facility]) Care Management  [place]  03/24/2018   Monique Cabrera Nov 14, 1935 [id number]  Subjective: Telephone call placed to the patient for monthly assessment. HIPAA verified.  The patient states that she is doing well.  She denies any pain but she states that she did have fall.  She said that she was in a rush and the room was not well lighted.  She did not injure herself. She is being adherent with her medications.  The patient had not checked her blood sugar yet this morning but yesterday it was 175.  RNHC advised the patient about her food and fluid intake.  The patient verbalized understanding.  The patient has joined the recreational center and exercising two to three days a week. She states that she is happier getting out of the house. She haas an appointment for recheck on April 29 th.   Encounter Medications:  Outpatient Encounter Medications as of 03/24/2018  Medication Sig  . amLODipine (NORVASC) 10 MG tablet Take 1 tablet (10 mg total) by mouth daily. for high blood pressure  . atorvastatin (LIPITOR) 20 MG tablet Take 20 mg by mouth daily.  . carvedilol (COREG) 25 MG tablet TAKE 1 TABLET BY MOUTH TWICE A DAY WITH A MEAL  . cloNIDine (CATAPRES) 0.1 MG tablet Take 1 tablet (0.1 mg total) by mouth at bedtime.  . clopidogrel (PLAVIX) 75 MG tablet TAKE ONE TABLET BY MOUTH ONCE DAILY  . glucose blood (ONETOUCH VERIO) test strip Use to test blood sugar three times daily. Dx: E11.9  . Insulin Glargine (LANTUS SOLOSTAR) 100 UNIT/ML Solostar Pen Inject 50 units under the skin daily for Diabetes  . insulin lispro (HUMALOG KWIKPEN) 100 UNIT/ML KiwkPen 6 units breakfast, 6 units lunch & supper  . Insulin Pen Needle (B-D ULTRAFINE III SHORT PEN) 31G X 8 MM MISC Use to check blood sugar every day. Dx: 11.29; 11.65  . valsartan-hydrochlorothiazide (DIOVAN-HCT) 320-25 MG tablet Take 1 tablet by mouth daily.   No facility-administered encounter medications on file as  of 03/24/2018.     Functional Status:  In your present state of health, do you have any difficulty performing the following activities: 02/06/2018 09/12/2017  Hearing? N N  Vision? N N  Difficulty concentrating or making decisions? Y N  Walking or climbing stairs? Y N  Dressing or bathing? N N  Doing errands, shopping? Y N  Preparing Food and eating ? N N  Using the Toilet? N N  In the past six months, have you accidently leaked urine? N N  Do you have problems with loss of bowel control? N N  Managing your Medications? N N  Managing your Finances? N N  Housekeeping or managing your Housekeeping? N N  Some recent data might be hidden    Fall/Depression Screening: Fall Risk  02/17/2018 02/06/2018 01/18/2018  Falls in the past year? No No No  Number falls in past yr: - - -  Comment - - -  Injury with Fall? - - -  Risk for fall due to : - - -   PHQ 2/9 Scores 02/06/2018 10/14/2017 10/06/2017 09/12/2017 09/06/2017 08/18/2017 06/10/2017  PHQ - 2 Score 0 2 0 0 0 0 0  PHQ- 9 Score - 3 - - - - -    Assessment: Patient continues to benefit from health coach outreach for disease management and support.    THN CM Care Plan Problem One     Most Recent Value  THN Long  Term Goal   Patient will decrease A1c by 0.2 points within 90 days.  THN Long Term Goal Start Date  03/24/18  [name] CM Short Term Goal #1 Start Date  03/24/18  [facility] CM Short Term Goal #1 Met Date  03/24/18  Interventions for Short Term Goal #1  Patient stated that she is recording her blood sugars daily     Plan: RN Health Coach will contact patient in the month of April  and patient agrees to next outreach.  Lazaro Arms RN, BSN, [place] Direct Dial:  [phone]  Fax: [phone]

## 2018-04-20 ENCOUNTER — Other Ambulatory Visit: Payer: Medicare Other

## 2018-04-20 DIAGNOSIS — IMO0002 Reserved for concepts with insufficient information to code with codable children: Secondary | ICD-10-CM

## 2018-04-20 DIAGNOSIS — E1165 Type 2 diabetes mellitus with hyperglycemia: Principal | ICD-10-CM

## 2018-04-20 DIAGNOSIS — E1129 Type 2 diabetes mellitus with other diabetic kidney complication: Secondary | ICD-10-CM | POA: Diagnosis not present

## 2018-04-20 LAB — COMPLETE METABOLIC PANEL WITH GFR
AG Ratio: 1.3 (calc) (ref 1.0–2.5)
ALT: 15 U/L (ref 6–29)
AST: 17 U/L (ref 10–35)
Albumin: 3.9 g/dL (ref 3.6–5.1)
Alkaline phosphatase (APISO): 83 U/L (ref 33–130)
BUN/Creatinine Ratio: 22 (calc) (ref 6–22)
BUN: 33 mg/dL — ABNORMAL HIGH (ref 7–25)
CO2: 25 mmol/L (ref 20–32)
Calcium: 10.1 mg/dL (ref 8.6–10.4)
Chloride: 107 mmol/L (ref 98–110)
Creat: 1.47 mg/dL — ABNORMAL HIGH (ref 0.60–0.88)
GFR, Est African American: 39 mL/min/{1.73_m2} — ABNORMAL LOW (ref >=60)
GFR, Est Non African American: 33 mL/min/{1.73_m2} — ABNORMAL LOW (ref >=60)
Globulin: 3 g/dL (calc) (ref 1.9–3.7)
Glucose, Bld: 146 mg/dL — ABNORMAL HIGH (ref 65–99)
Potassium: 4.3 mmol/L (ref 3.5–5.3)
Sodium: 138 mmol/L (ref 135–146)
Total Bilirubin: 0.5 mg/dL (ref 0.2–1.2)
Total Protein: 6.9 g/dL (ref 6.1–8.1)

## 2018-04-21 LAB — HEMOGLOBIN A1C
Hgb A1c MFr Bld: 8 % of total Hgb — ABNORMAL HIGH (ref <5.7)
Mean Plasma Glucose: 183 (calc)
eAG (mmol/L): 10.1 (calc)

## 2018-04-24 ENCOUNTER — Ambulatory Visit (INDEPENDENT_AMBULATORY_CARE_PROVIDER_SITE_OTHER): Payer: Medicare Other | Admitting: Pharmacotherapy

## 2018-04-24 ENCOUNTER — Encounter: Payer: Self-pay | Admitting: Pharmacotherapy

## 2018-04-24 VITALS — BP 140/80 | HR 65 | Ht 65.0 in | Wt 153.0 lb

## 2018-04-24 DIAGNOSIS — I10 Essential (primary) hypertension: Secondary | ICD-10-CM | POA: Diagnosis not present

## 2018-04-24 DIAGNOSIS — E1165 Type 2 diabetes mellitus with hyperglycemia: Secondary | ICD-10-CM

## 2018-04-24 DIAGNOSIS — E1129 Type 2 diabetes mellitus with other diabetic kidney complication: Secondary | ICD-10-CM | POA: Diagnosis not present

## 2018-04-24 DIAGNOSIS — IMO0002 Reserved for concepts with insufficient information to code with codable children: Secondary | ICD-10-CM

## 2018-04-24 NOTE — Patient Instructions (Signed)
Be sure to take Humalog with each meal.  Continue to write down all Humalog doses.  Please make sure she has an office visit in the next 3 months with Dr Mariea Clonts.

## 2018-04-24 NOTE — Progress Notes (Signed)
Subjective:    Monique Cabrera is a 83 y.o.African American female who presents for follow-up of Type 2 diabetes mellitus.  She does not endorse any acute diabetic symptoms and reports that she feels like her sugars are much more stabilized than they have been in the past.   Current diabetic medications include Lantus 50 units once daily, Humalog 6 units TID with meals   Current diet: enjoys eating starch-rich foods such as hashbrowns/potatatoes/french fries, trying to watch portions, started slimfast  Current exercise: endorses attending seated exercises  Vision: seeing eye doctor annually Feet: no tingling/burning, some numbness in big toes Nocturia: 1-2 times per night  Hydration: drinking lots of wtaer Dysuria: denies Peripheral Edema: denies  Denies polydipsia, polyuria, and polyphagia  Patient brought in her meter and a logbook of Humalog doses for review. Current monitoring regimen: home blood tests - 1-2 times daily Home blood sugar records: fasting range: 135-166, post-prandial sugars up to 189 Any episodes of hypoglycemia? yes - 1 event after taking Humalog and not eating Consistently missing Humalog doses because she feels like she's not eating enough to take the dose  Is She on ACE inhibitor or angiotensin II receptor blocker?  Yes  valsartan + HCTZ (Diovan HCT)  Review of Systems Pertinent items are noted in HPI.    Objective:    BP 140/80   Pulse 65   Ht 5' 5"$  (1.651 m)   Wt 153 lb (69.4 kg)   SpO2 96%   BMI 25.46 kg/m   General:  alert, cooperative and no distress                          Extremities: extremities normal, atraumatic, no cyanosis or edema  Skin: warm and dry, no hyperpigmentation, vitiligo, or suspicious lesions     Neuro: normal without focal findings, mental status, speech normal, alert and oriented x3 and PERLA   Lab Review Glucose, Bld (mg/dL)  Date Value  04/20/2018 146 (H)  01/20/2018 254 (H)  10/20/2017 178 (H)   CO2  (mmol/L)  Date Value  04/20/2018 25  01/20/2018 24  10/20/2017 25   BUN (mg/dL)  Date Value  04/20/2018 33 (H)  01/20/2018 28 (H)  10/20/2017 23  06/11/2016 21  04/14/2016 16  01/19/2016 16   Creat (mg/dL)  Date Value  04/20/2018 1.47 (H)  01/20/2018 1.36 (H)  10/20/2017 1.43 (H)   Hemoglobin A1c: 8.0   Assessment:    Diabetes Mellitus type II, under good control. Hemoglobin A1c has improved from 8.6 to 8.0. Taking into account her hypoglycemia unawareness, age, and risk for falls she is under good control with room for improved Humalog compliance. Fasting blood sugars are in acceptable range for her age and co morbidities. If she were to stay compliant to her Humalog doses, post-prandial sugars would improve and her A1c would likely drop below 8%.    Plan:    1.  Rx changes: none. Encouraged Humalog compliance. She was instructed to write down her Humalog doses for accountability.  2.  Education: Reviewed 'ABCs' of diabetes management (respective goals in parentheses):  A1C (<8), blood pressure (<130/80), and cholesterol (LDL <100). Extensive amount of time educating her on portion sizes, the plate method, and to stay compliant with her Humalog during meals. 3.  Compliance at present is estimated to be poor. Efforts to improve compliance (if necessary) will be directed at dietary modifications: decrease starch portion sizes, increased exercise and regular blood  sugar monitoring: 1-2 times daily. 4. Follow up: 3 months   5. BP above goal <130/80 today.  She is stable, and will continue current dose of valsartan/HCTZ.

## 2018-04-25 ENCOUNTER — Other Ambulatory Visit: Payer: Self-pay

## 2018-04-25 NOTE — Patient Outreach (Signed)
[place] [facility]) Care Management  [place]  04/25/2018   Monique Cabrera [birth year] [id number]  Subjective: Telephone call to the patient for monthly assessment.  HIPAA verified.  The patient states that she is doing well.  She denies any pain or falls.  She states that she was seen at her physician office yesterday and her a1c has come down from 8.5 three months ago to 8.0.  She checked her blood sugar this morning and it was 223.  She states that she is not sure why it is that high this morning.  She states that she has not had a low blood sugar. RN Health Coach talked with the patient about her food intake and she states that she is more aware of what she eats and is eating three meals a day.  She is adherent with her medications.  She is going to the [facility] and walking three days a week.     Encounter Medications:  Outpatient Encounter Medications as of 04/25/2018  Medication Sig  . amLODipine (NORVASC) 10 MG tablet Take 1 tablet (10 mg total) by mouth daily. for high blood pressure  . atorvastatin (LIPITOR) 20 MG tablet Take 20 mg by mouth daily.  . carvedilol (COREG) 25 MG tablet TAKE 1 TABLET BY MOUTH TWICE A DAY WITH A MEAL  . cloNIDine (CATAPRES) 0.1 MG tablet Take 1 tablet (0.1 mg total) by mouth at bedtime.  . clopidogrel (PLAVIX) 75 MG tablet TAKE ONE TABLET BY MOUTH ONCE DAILY  . glucose blood (ONETOUCH VERIO) test strip Use to test blood sugar three times daily. Dx: E11.9  . Insulin Glargine (LANTUS SOLOSTAR) 100 UNIT/ML Solostar Pen Inject 50 units under the skin daily for Diabetes  . insulin lispro (HUMALOG KWIKPEN) 100 UNIT/ML KiwkPen 6 units breakfast, 6 units lunch & supper  . Insulin Pen Needle (B-D ULTRAFINE III SHORT PEN) 31G X 8 MM MISC Use to check blood sugar every day. Dx: 11.29; 11.65  . valsartan-hydrochlorothiazide (DIOVAN-HCT) 320-25 MG tablet Take 1 tablet by mouth daily.   No facility-administered encounter medications on file as  of 04/25/2018.     Functional Status:  In your present state of health, do you have any difficulty performing the following activities: 02/06/2018 09/12/2017  Hearing? N N  Vision? N N  Difficulty concentrating or making decisions? Y N  Walking or climbing stairs? Y N  Dressing or bathing? N N  Doing errands, shopping? Y N  Preparing Food and eating ? N N  Using the Toilet? N N  In the past six months, have you accidently leaked urine? N N  Do you have problems with loss of bowel control? N N  Managing your Medications? N N  Managing your Finances? N N  Housekeeping or managing your Housekeeping? N N  Some recent data might be hidden    Fall/Depression Screening: Fall Risk  04/25/2018 03/24/2018 02/17/2018  Falls in the past year? No Yes No  Number falls in past yr: - 1 -  Comment - - -  Injury with Fall? - No -  Risk for fall due to : - - -  Follow up - Falls evaluation completed;Education provided -   [facility] 2/9 Scores 02/06/2018 10/14/2017 10/06/2017 09/12/2017 09/06/2017 08/18/2017 06/10/2017  PHQ - 2 Score 0 2 0 0 0 0 0  PHQ- 9 Score - 3 - - - - -    Assessment: Patient will continue to benefit from health coach outreach for disease  management and support.  THN CM Care Plan Problem One     Most Recent Value  Care Plan for Problem One  Active  THN Long Term Goal   In 90 days the patient will decrease her a1c of 8.0 down by 0.2 points  THN Long Term Goal Start Date  04/25/18  Interventions for Problem One Long Term Goal  Discused with the patient about food intake , exercise and medication adherence     Plan: [place] will contact patient in the month of May and patient agrees to next outreach.  Lazaro Arms RN, BSN, [place] Direct Dial:  [phone]  Fax: [phone]

## 2018-05-10 ENCOUNTER — Other Ambulatory Visit: Payer: Self-pay | Admitting: *Deleted

## 2018-05-10 MED ORDER — INSULIN GLARGINE 100 UNIT/ML SOLOSTAR PEN: PEN_INJECTOR | SUBCUTANEOUS | 3 refills | Status: DC

## 2018-05-10 NOTE — Telephone Encounter (Signed)
Patient requested refill and 90 day supply. Faxed.

## 2018-05-26 ENCOUNTER — Other Ambulatory Visit: Payer: Self-pay

## 2018-05-26 NOTE — Patient Outreach (Signed)
[facility]) Care Management  05/26/2018  Monique Cabrera Aug 24, 1935 [id number]   1st attempt to outreach the patient for monthly assessment. No answer. Unable to leave a message.   Plan: RN Health Coach will send letter. RN Health Coach will make another attempt to the patient within four business days.  Lazaro Arms RN, BSN, [place] Direct Dial:  [phone]  Fax: [phone]

## 2018-06-02 ENCOUNTER — Other Ambulatory Visit: Payer: Self-pay

## 2018-06-02 NOTE — Patient Outreach (Signed)
[place] [facility]) Care Management  [place]  06/02/2018   Monique Cabrera May 14, 1935 [id number]  Subjective: Successful outreach to the patient for assessment. HIPAA verified.  The patient had not checked her blood sugar this morning yet and had eaten thirty minutes ago but yesterday it was 230.  She knows that she had eaten so sweets and that is what made her blood sugar high.  I discussed with her about monitoring her food intake and how it affects her blood sugars. I also talked with her about the proper times to check her blood sugars.  She verbalized understanding.  She states that she was having problems with her transportation and missed an appointment on June 3rd to see her Urologist.  She has spoken with her Education officer, museum and they have taken care of the problem.  She has been adherent with her medications and is still walking for exercise. The patient states that she has rescheduled an appointment with her urologist for June 21 st and has an appointment with her primary care on Monday June 10 th.    Encounter Medications:  Outpatient Encounter Medications as of 06/02/2018  Medication Sig  . amLODipine (NORVASC) 10 MG tablet Take 1 tablet (10 mg total) by mouth daily. for high blood pressure  . atorvastatin (LIPITOR) 20 MG tablet Take 20 mg by mouth daily.  . carvedilol (COREG) 25 MG tablet TAKE 1 TABLET BY MOUTH TWICE A DAY WITH A MEAL  . cloNIDine (CATAPRES) 0.1 MG tablet Take 1 tablet (0.1 mg total) by mouth at bedtime.  . clopidogrel (PLAVIX) 75 MG tablet TAKE ONE TABLET BY MOUTH ONCE DAILY  . glucose blood (ONETOUCH VERIO) test strip Use to test blood sugar three times daily. Dx: E11.9  . Insulin Glargine (LANTUS SOLOSTAR) 100 UNIT/ML Solostar Pen Inject 50 units under the skin daily for Diabetes  . insulin lispro (HUMALOG KWIKPEN) 100 UNIT/ML KiwkPen 6 units breakfast, 6 units lunch & supper  . Insulin Pen Needle (B-D ULTRAFINE III SHORT PEN) 31G X 8 MM MISC  Use to check blood sugar every day. Dx: 11.29; 11.65  . valsartan-hydrochlorothiazide (DIOVAN-HCT) 320-25 MG tablet Take 1 tablet by mouth daily.   No facility-administered encounter medications on file as of 06/02/2018.     Functional Status:  In your present state of health, do you have any difficulty performing the following activities: 02/06/2018 09/12/2017  Hearing? N N  Vision? N N  Difficulty concentrating or making decisions? Y N  Walking or climbing stairs? Y N  Dressing or bathing? N N  Doing errands, shopping? Y N  Preparing Food and eating ? N N  Using the Toilet? N N  In the past six months, have you accidently leaked urine? N N  Do you have problems with loss of bowel control? N N  Managing your Medications? N N  Managing your Finances? N N  Housekeeping or managing your Housekeeping? N N  Some recent data might be hidden    Fall/Depression Screening: Fall Risk  06/02/2018 04/25/2018 03/24/2018  Falls in the past year? No No Yes  Number falls in past yr: - - 1  Comment - - -  Injury with Fall? - - No  Risk for fall due to : - - -  Follow up - - Falls evaluation completed;Education provided   [facility] 2/9 Scores 02/06/2018 10/14/2017 10/06/2017 09/12/2017 09/06/2017 08/18/2017 06/10/2017  PHQ - 2 Score 0 2 0 0 0 0 0  PHQ- 9  Score - 3 - - - - -    Assessment: Patient will continue to benefit from health coach outreach for disease management and support.  THN CM Care Plan Problem One     Most Recent Value  THN Long Term Goal   In 90 days the patient will decrease her a1c of 8.0 down by 0.2 points  THN Long Term Goal Start Date  06/02/18  Interventions for Problem One Long Term Goal  reinterated to the patient about monitoring her diet, cutting back on sweets and medications adherence      Plan: [place] will contact patient in the month of July and patient agrees to next outreach.  Lazaro Arms RN, BSN, [place] Direct Dial:  [phone]  Fax: [phone]

## 2018-06-05 ENCOUNTER — Encounter: Payer: Self-pay | Admitting: Internal Medicine

## 2018-06-05 ENCOUNTER — Ambulatory Visit (INDEPENDENT_AMBULATORY_CARE_PROVIDER_SITE_OTHER): Payer: Medicare Other | Admitting: Internal Medicine

## 2018-06-05 VITALS — BP 126/70 | HR 63 | Temp 98.5°F | Ht 65.0 in | Wt 151.0 lb

## 2018-06-05 DIAGNOSIS — E1165 Type 2 diabetes mellitus with hyperglycemia: Secondary | ICD-10-CM | POA: Diagnosis not present

## 2018-06-05 DIAGNOSIS — E1169 Type 2 diabetes mellitus with other specified complication: Secondary | ICD-10-CM

## 2018-06-05 DIAGNOSIS — E785 Hyperlipidemia, unspecified: Secondary | ICD-10-CM | POA: Diagnosis not present

## 2018-06-05 DIAGNOSIS — D229 Melanocytic nevi, unspecified: Secondary | ICD-10-CM | POA: Diagnosis not present

## 2018-06-05 DIAGNOSIS — I739 Peripheral vascular disease, unspecified: Secondary | ICD-10-CM

## 2018-06-05 DIAGNOSIS — E113553 Type 2 diabetes mellitus with stable proliferative diabetic retinopathy, bilateral: Secondary | ICD-10-CM | POA: Insufficient documentation

## 2018-06-05 DIAGNOSIS — B182 Chronic viral hepatitis C: Secondary | ICD-10-CM

## 2018-06-05 DIAGNOSIS — G5603 Carpal tunnel syndrome, bilateral upper limbs: Secondary | ICD-10-CM | POA: Diagnosis not present

## 2018-06-05 DIAGNOSIS — IMO0002 Reserved for concepts with insufficient information to code with codable children: Secondary | ICD-10-CM

## 2018-06-05 DIAGNOSIS — E1129 Type 2 diabetes mellitus with other diabetic kidney complication: Secondary | ICD-10-CM

## 2018-06-05 NOTE — Patient Instructions (Addendum)
I recommend you get wrist splints for each wrist at the pharmacy and wear them at night.  It may help the tingling and cramping of your fingers.  If it does not, we can check a nerve conduction study or an evaluation by a hand surgeon.  Keep appt for fasting labs in July as planned.    Avoid nsaids like aleve, motrin/ibuprofen, goody's powder, etc.  Use only tylenol for headaches.  Try to write yourself a schedule at home to keep on track with your insulin and sugar checks, taking your medications.

## 2018-06-05 NOTE — Progress Notes (Signed)
Location:  [facility] clinic Provider:  Fuller Makin L. Mariea Clonts, D.O., C.M.D.  Code Status: full code Goals of Care:  Advanced Directives 06/05/2018  Does Patient Have a Medical Advance Directive? No  Would patient like information on creating a medical advance directive? No - Patient declined  Pre-existing out of facility DNR order (yellow form or pink MOST form) -   Chief Complaint  Patient presents with  . Medical Management of Chronic Issues    46mth follow-up    HPI: Patient is a 83 y.o. female seen today for medical management of chronic diseases.    DMII:   Lab Results  Component Value Date   HGBA1C 8.0 (H) 04/20/2018  Says she is doing ok.  Didn't bring meter.  Says sugars have been pretty good. A few 225 or so.  None in 300s or more. No lows. 130s in the mornings.  She might forget to check and eat sometimes.  Also late taking her medicine at times.    She is now having headaches every morning.  When she gets up and about, it goes away.  Discussed avoiding nsaids and using tylenol only.   This is always before her pills.  Sometimes her neck also hurts.  Says her pillows are lousy.  Says she has a problem with her hands and gets leg cramps.  Is avoiding salty foods.  Happens in the bed and has to jump up and walk around for it to go away.   Fingers will crook over and she can't move them like she did.  Her son got her "arthritis gloves". When she is combing her hair, it may happen.  Fingers are numb and tingling.  Does not affect thumbs, just other fingers.  She'll squeeze them and work them around and then can use them.    She accidentally through out her new glasses with her mail she wanted to throw out.  Her son took it out to the dumpster.  Notices her memory is just not what it used to be.  She also still feels down.  She says the program [place] [facility] rec where she was exercising and socializing.  It ended 5/10 and she's back to sittng at home.  Children have been coming over  to take her out a little bit.   Doesn't want to feel sorry for herself.  Has to reset her mindset.    Past Medical History:  Diagnosis Date  . Acute upper respiratory infections of unspecified site   . Anemia   . Anemia, unspecified   . Atherosclerosis of native arteries of the extremities, unspecified   . Chest pain, unspecified   . Chronic kidney disease (CKD), stage II (mild)   . Diabetes mellitus   . Diarrhea   . Disorder of bone and cartilage, unspecified   . DM (diabetes mellitus) type II controlled with renal manifestation ([place])   . Herpes zoster with other nervous system complications(053.19)   . Hypercalcemia   . Hypertension   . Hypertensive renal disease, benign   . Nonspecific reaction to tuberculin skin test without active tuberculosis(795.51)   . Nonspecific tuberculin test reaction   . Other and unspecified hyperlipidemia   . Pain in joint, lower leg   . Peripheral arterial disease ([place])   . Postherpetic neuralgia   . Proteinuria   . Stroke ([place]) 01/2017  . Type II or unspecified type diabetes mellitus with renal manifestations, not stated as uncontrolled(250.40)   . Type II  or unspecified type diabetes mellitus with renal manifestations, uncontrolled(250.42)   . Unspecified disorder of kidney and ureter   . Unspecified essential hypertension     Past Surgical History:  Procedure Laterality Date  . hysterectomy    . INCISION AND DRAINAGE Left 05/27/14   sebacous cyst, ear  . PRP Left    Dr. Ricki Miller  . removal of cyst from hand    . removal of tumor from foot    . TONSILLECTOMY      Allergies  Allergen Reactions  . Invokana [Canagliflozin] Other (See Comments)    Vagina itching and swelling Vaginal Itching and irritation   . Jardiance [Empagliflozin] Itching and Other (See Comments)    Vaginal itching and swelling    Outpatient Encounter Medications as of 06/05/2018  Medication Sig  . amLODipine (NORVASC) 10 MG tablet Take 1 tablet (10 mg total) by  mouth daily. for high blood pressure  . atorvastatin (LIPITOR) 20 MG tablet Take 20 mg by mouth daily.  . carvedilol (COREG) 25 MG tablet TAKE 1 TABLET BY MOUTH TWICE A DAY WITH A MEAL  . cloNIDine (CATAPRES) 0.1 MG tablet Take 1 tablet (0.1 mg total) by mouth at bedtime.  . clopidogrel (PLAVIX) 75 MG tablet TAKE ONE TABLET BY MOUTH ONCE DAILY  . glucose blood (ONETOUCH VERIO) test strip Use to test blood sugar three times daily. Dx: E11.9  . Insulin Glargine (LANTUS SOLOSTAR) 100 UNIT/ML Solostar Pen Inject 50 units under the skin daily for Diabetes  . insulin lispro (HUMALOG KWIKPEN) 100 UNIT/ML KiwkPen 6 units breakfast, 6 units lunch & supper  . Insulin Pen Needle (B-D ULTRAFINE III SHORT PEN) 31G X 8 MM MISC Use to check blood sugar every day. Dx: 11.29; 11.65  . valsartan-hydrochlorothiazide (DIOVAN-HCT) 320-25 MG tablet Take 1 tablet by mouth daily.   No facility-administered encounter medications on file as of 06/05/2018.     Review of Systems:  Review of Systems  Constitutional: Negative for chills and fever.  HENT: Positive for tinnitus. Negative for congestion.   Eyes: Positive for blurred vision.       Lost her glasses  Respiratory: Negative for cough and shortness of breath.   Cardiovascular: Negative for chest pain.  Gastrointestinal: Negative for abdominal pain.  Genitourinary: Negative for dysuria.  Musculoskeletal: Positive for neck pain. Negative for falls.  Skin: Negative for itching and rash.       Atypical raised nevus on right temple  Neurological: Positive for tingling, sensory change and headaches.  Endo/Heme/Allergies: Does not bruise/bleed easily.  Psychiatric/Behavioral: Positive for memory loss. Negative for depression.    Health Maintenance  Topic Date Due  . INFLUENZA VACCINE  07/27/2018  . HEMOGLOBIN A1C  10/20/2018  . OPHTHALMOLOGY EXAM  01/26/2019  . FOOT EXAM  02/06/2019  . DEXA SCAN  Completed  . PNA vac Low Risk Adult  Completed    Physical  Exam: Vitals:   06/05/18 1014  BP: 126/70  Pulse: 63  Temp: 98.5 F (36.9 C)  TempSrc: Oral  SpO2: 97%  Weight: 151 lb (68.5 kg)  Height: 5\' 5"  (1.651 m)   Body mass index is 25.13 kg/m. Physical Exam  Constitutional: She is oriented to person, place, and time. No distress.  HENT:  Head: Normocephalic and atraumatic.  Cardiovascular: Normal rate, regular rhythm, normal heart sounds and intact distal pulses.  Pulmonary/Chest: Effort normal and breath sounds normal. No respiratory distress.  Abdominal: Soft. Bowel sounds are normal. She exhibits no distension. There is  no tenderness.  Musculoskeletal: Normal range of motion.  Neurological: She is alert and oriented to person, place, and time.  Skin: Capillary refill takes less than 2 seconds.  Atypical raised nevus on right temple  Psychiatric: She has a normal mood and affect.    Labs reviewed: Basic Metabolic Panel: Recent Labs    10/20/17 0928 01/20/18 0906 04/20/18 0909  NA 135 136 138  K 4.2 3.9 4.3  CL 103 106 107  CO2 25 24 25   GLUCOSE 178* 254* 146*  BUN 23 28* 33*  CREATININE 1.43* 1.36* 1.47*  CALCIUM 9.8 10.1 10.1   Liver Function Tests: Recent Labs    07/11/17 0831 10/20/17 0928 01/20/18 0906 04/20/18 0909  AST 16 13 15 17   ALT 17 10 16 15   ALKPHOS 72  --   --   --   BILITOT 0.4 0.4 0.4 0.5  PROT 6.5 6.8 6.9 6.9  ALBUMIN 3.7  --   --   --    No results for input(s): LIPASE, AMYLASE in the last 8760 hours. No results for input(s): AMMONIA in the last 8760 hours. CBC: Recent Labs    06/26/17 2252  WBC 7.0  HGB 10.4*  HCT 30.3*  MCV 89.4  PLT 218   Lipid Panel: Recent Labs    10/20/17 0928  CHOL 159  HDL 57  LDLCALC 84  TRIG 88  CHOLHDL 2.8   Lab Results  Component Value Date   HGBA1C 8.0 (H) 04/20/2018   Assessment/Plan 1. Type II diabetes mellitus with renal manifestations, uncontrolled (HCC) - last hba1c actually quite good for her and w/o lows by her report, but still  having issues remembering to check cbgs and insulin as directed and to eat regularly--recommended she make herself a schedule since she's not currently following any schedule for things in life so she can remember these important things - Ambulatory referral to Podiatry  2. PAD (peripheral artery disease) (HCC) - no changes, one foot stays dryer than other - Ambulatory referral to Podiatry--her previous podiatrist, Dr. Little Ishikawa, is no longer in her insurance network  3. Chronic hepatitis C without hepatic coma (HCC) -previously treated per pt  4. Stable proliferative diabetic retinopathy of both eyes associated with type 2 diabetes mellitus (HCC) -cont current regimen, but must be more adherent  5. Hyperlipidemia associated with type 2 diabetes mellitus ([place]) -cont lipitor therapy  6. Carpal tunnel syndrome, bilateral upper limbs -new complaint, likely due to her diabetes, but could also have cervical stenosis--pt is not a great historian to get accurate details of symptoms -advised to try wrist splints at night for at least a month and if no better, then I will send her for a nerve conduction study   7. Nevus - usually covered by her hair -is growing on right temple -would like it removed so will refer to derm - Ambulatory referral to Dermatology   Labs/tests ordered:   Orders Placed This Encounter  Procedures  . Ambulatory referral to Dermatology    Referral Priority:   Routine    Referral Type:   Consultation    Referral Reason:   Specialty Services Required    Requested Specialty:   Dermatology    Number of Visits Requested:   1  . Ambulatory referral to Podiatry    Referral Priority:   Routine    Referral Type:   Consultation    Referral Reason:   Specialty Services Required    Requested Specialty:   Podiatry  Number of Visits Requested:   1    Next appt:  10/12/2018 to see me, but needs lab appt in July or early august for hba1c  Barri Neidlinger L. Sammantha Mehlhaff,  D.O. [facility] [address],  [address] Cell Phone (Mon-Fri 8am-5pm):  [phone] On Call:  [phone] & follow prompts after 5pm & weekends Office Phone:  [phone] Office Fax:  [phone]

## 2018-06-16 DIAGNOSIS — N183 Chronic kidney disease, stage 3 (moderate): Secondary | ICD-10-CM | POA: Diagnosis not present

## 2018-06-16 DIAGNOSIS — I639 Cerebral infarction, unspecified: Secondary | ICD-10-CM | POA: Diagnosis not present

## 2018-06-16 DIAGNOSIS — E1121 Type 2 diabetes mellitus with diabetic nephropathy: Secondary | ICD-10-CM | POA: Diagnosis not present

## 2018-06-16 DIAGNOSIS — I129 Hypertensive chronic kidney disease with stage 1 through stage 4 chronic kidney disease, or unspecified chronic kidney disease: Secondary | ICD-10-CM | POA: Diagnosis not present

## 2018-07-03 ENCOUNTER — Ambulatory Visit: Payer: Self-pay

## 2018-07-04 ENCOUNTER — Other Ambulatory Visit: Payer: Self-pay

## 2018-07-04 NOTE — Patient Outreach (Signed)
[facility]) Care Management  [place]  07/04/2018   Monique Cabrera 12-Jul-1935 [id number]  Subjective: Telephone call to the patient for assessment. HIPAA verified.  The patient states she is doing fine.  She checked her blood sugar this morning and it was 225.  I discussed with the patient about her food and fluid intake and she states that she understood.  She denies any pain but said the she had a fall crossing the street and her foot slipped.  She said that she just sat  on the ground and did not injure herself.  I talked with her about being careful of her surroundings and slowing down.  She stated that she understood.  The patient states that she has been feeling bored and misses going to the program at the center.  I advised the patient to call again and find out when the next program will begin.  The patient was seen at her doctor's office on 06/05/2018.  Her next appointment will not be until 10/17/82019.  She is also due to have her labs redrawn in July or early August to check her a1c.    Encounter Medications:  Outpatient Encounter Medications as of 07/04/2018  Medication Sig  . amLODipine (NORVASC) 10 MG tablet Take 1 tablet (10 mg total) by mouth daily. for high blood pressure  . atorvastatin (LIPITOR) 20 MG tablet Take 20 mg by mouth daily.  . carvedilol (COREG) 25 MG tablet TAKE 1 TABLET BY MOUTH TWICE A DAY WITH A MEAL  . cloNIDine (CATAPRES) 0.1 MG tablet Take 1 tablet (0.1 mg total) by mouth at bedtime.  . clopidogrel (PLAVIX) 75 MG tablet TAKE ONE TABLET BY MOUTH ONCE DAILY  . Insulin Glargine (LANTUS SOLOSTAR) 100 UNIT/ML Solostar Pen Inject 50 units under the skin daily for Diabetes  . insulin lispro (HUMALOG KWIKPEN) 100 UNIT/ML KiwkPen 6 units breakfast, 6 units lunch & supper  . Insulin Pen Needle (B-D ULTRAFINE III SHORT PEN) 31G X 8 MM MISC Use to check blood sugar every day. Dx: 11.29; 11.65  . valsartan-hydrochlorothiazide (DIOVAN-HCT)  320-25 MG tablet Take 1 tablet by mouth daily.  Marland Kitchen glucose blood (ONETOUCH VERIO) test strip Use to test blood sugar three times daily. Dx: E11.9   No facility-administered encounter medications on file as of 07/04/2018.     Functional Status:  In your present state of health, do you have any difficulty performing the following activities: 02/06/2018 09/12/2017  Hearing? N N  Vision? N N  Difficulty concentrating or making decisions? Y N  Walking or climbing stairs? Y N  Dressing or bathing? N N  Doing errands, shopping? Y N  Preparing Food and eating ? N N  Using the Toilet? N N  In the past six months, have you accidently leaked urine? N N  Do you have problems with loss of bowel control? N N  Managing your Medications? N N  Managing your Finances? N N  Housekeeping or managing your Housekeeping? N N  Some recent data might be hidden    Fall/Depression Screening: Fall Risk  06/05/2018 06/02/2018 04/25/2018  Falls in the past year? No No No  Number falls in past yr: - - -  Comment - - -  Injury with Fall? - - -  Risk for fall due to : - - -  Follow up - - -   PHQ 2/9 Scores 06/05/2018 02/06/2018 10/14/2017 10/06/2017 09/12/2017 09/06/2017 08/18/2017  PHQ - 2 Score 0 0  2 0 0 0 0  PHQ- 9 Score - - 3 - - - -    Assessment: Patient will continue to benefit from health coach outreach for disease management and support.  THN CM Care Plan Problem One     Most Recent Value  THN Long Term Goal   In 90 days the patient will decrease her a1c of 8.0 down by 0.2 points  THN Long Term Goal Start Date  07/04/18  Interventions for Problem One Long Term Goal  discussed food, fluid intake and medication adherence     Plan: RN Health Coach will contact patient in the month of August and patient agrees to next outreach.  Lazaro Arms RN, BSN, [place] Direct Dial:  [phone]  Fax: [phone]

## 2018-07-06 DIAGNOSIS — R402441 Other coma, without documented Glasgow coma scale score, or with partial score reported, in the field [EMT or ambulance]: Secondary | ICD-10-CM | POA: Diagnosis not present

## 2018-07-06 DIAGNOSIS — I959 Hypotension, unspecified: Secondary | ICD-10-CM | POA: Diagnosis not present

## 2018-07-06 DIAGNOSIS — E161 Other hypoglycemia: Secondary | ICD-10-CM | POA: Diagnosis not present

## 2018-07-06 DIAGNOSIS — E162 Hypoglycemia, unspecified: Secondary | ICD-10-CM | POA: Diagnosis not present

## 2018-07-15 ENCOUNTER — Other Ambulatory Visit: Payer: Self-pay | Admitting: Internal Medicine

## 2018-07-15 DIAGNOSIS — Z794 Long term (current) use of insulin: Principal | ICD-10-CM

## 2018-07-15 DIAGNOSIS — E1122 Type 2 diabetes mellitus with diabetic chronic kidney disease: Secondary | ICD-10-CM

## 2018-07-15 DIAGNOSIS — IMO0002 Reserved for concepts with insufficient information to code with codable children: Secondary | ICD-10-CM

## 2018-07-15 DIAGNOSIS — N183 Chronic kidney disease, stage 3 (moderate): Principal | ICD-10-CM

## 2018-07-15 DIAGNOSIS — E1165 Type 2 diabetes mellitus with hyperglycemia: Principal | ICD-10-CM

## 2018-07-19 ENCOUNTER — Other Ambulatory Visit: Payer: Self-pay | Admitting: Internal Medicine

## 2018-07-24 ENCOUNTER — Other Ambulatory Visit: Payer: Medicare Other

## 2018-07-24 ENCOUNTER — Telehealth: Payer: Self-pay

## 2018-07-24 DIAGNOSIS — E1165 Type 2 diabetes mellitus with hyperglycemia: Secondary | ICD-10-CM | POA: Diagnosis not present

## 2018-07-24 DIAGNOSIS — IMO0002 Reserved for concepts with insufficient information to code with codable children: Secondary | ICD-10-CM

## 2018-07-24 DIAGNOSIS — E1129 Type 2 diabetes mellitus with other diabetic kidney complication: Secondary | ICD-10-CM | POA: Diagnosis not present

## 2018-07-24 NOTE — Telephone Encounter (Signed)
Patient refused appointment this week due to transportation. Patient states she has to give transportation a 3 day notice.

## 2018-07-24 NOTE — Telephone Encounter (Signed)
Please advise on blood sugar parameters

## 2018-07-24 NOTE — Telephone Encounter (Signed)
I discussed Dr.Reed's response with patient, patient verbalized understanding and states she will follow these instructions. I have copied and pasted instructions into a word documented and mailed instructions to patient.   I asked if patient can have one of her daughters attend appointment and patient stated they all have jobs yet she will see if one can attend

## 2018-07-24 NOTE — Telephone Encounter (Signed)
Patient was in office to get labs and stopped in the clinical area to discuss low blood sugar episodes:  Patient with low blood sugar episode 2 weeks ago, blood sugar dropped to 32, patient did not have any warning symptoms this time to indicate low blood sugar episode. Patient's daughter was at home with her, and called EMS due to patient looking weird. EMS came out and evaluated patient, patient was disoriented. EMS was able to get blood sugar back up (patient was given glucose gel packet)  to a normal range and offered to take patient to the emergency room, patient refused.   Patient states she is tired of having these episodes and would like for Dr.Reed to help. Patient admits to eating regular meals daily. Patient is also eating glucose tablet through out the day, patient takes 3 at a time. Patient does not have an idea of how many tablets she is consuming daily states greater than 6 tablets daily  I confirmed dosage and instructions of all diabetic medications, patient taking medication as prescribed.  Patient took blood sugar yesterday around 2:00 pm (no symptoms, just decided to check) and BS was 56, patient took 3 glucose tablets.  Patient rechecked BS about 30 min later and BS increased to 100 something.   Patient has a fear that she is going to die and no one is going to find her until days later due to low blood sugar episodes, this is patient's reasoning for taking glucose tablets.   Patient states she needs blood sugar parameters and a plan to avoid low blood sugar episodes, please advise.

## 2018-07-24 NOTE — Telephone Encounter (Signed)
She should not take her mealtime insulin if her sugar is under 120.   She should always take her lantus unless we tell her otherwise.

## 2018-07-24 NOTE — Telephone Encounter (Signed)
I recommend the following: When she gets up, she should check her glucose, then eat breakfast and take her morning insulin. At lunch, she should eat a full meal, then check her sugar and take her insulin At dinner, she should eat a full meal, then check her sugar and take her insulin Near bedtime, she should take a snack, then check her sugar and take her long acting insulin (lantus).  This has been a recurrent problem for her.  I recommend that she ask her daughter to come with her to her next visit so we can review her diabetes regimen, eating habits and memory concerns.

## 2018-07-25 LAB — COMPLETE METABOLIC PANEL WITH GFR
AG Ratio: 1.5 (calc) (ref 1.0–2.5)
ALT: 15 U/L (ref 6–29)
AST: 15 U/L (ref 10–35)
Albumin: 4.1 g/dL (ref 3.6–5.1)
Alkaline phosphatase (APISO): 82 U/L (ref 33–130)
BUN/Creatinine Ratio: 23 (calc) — ABNORMAL HIGH (ref 6–22)
BUN: 35 mg/dL — ABNORMAL HIGH (ref 7–25)
CO2: 24 mmol/L (ref 20–32)
Calcium: 10.1 mg/dL (ref 8.6–10.4)
Chloride: 105 mmol/L (ref 98–110)
Creat: 1.5 mg/dL — ABNORMAL HIGH (ref 0.60–0.88)
GFR, Est African American: 37 mL/min/{1.73_m2} — ABNORMAL LOW (ref >=60)
GFR, Est Non African American: 32 mL/min/{1.73_m2} — ABNORMAL LOW (ref >=60)
Globulin: 2.8 g/dL (calc) (ref 1.9–3.7)
Glucose, Bld: 166 mg/dL — ABNORMAL HIGH (ref 65–99)
Potassium: 4 mmol/L (ref 3.5–5.3)
Sodium: 137 mmol/L (ref 135–146)
Total Bilirubin: 0.5 mg/dL (ref 0.2–1.2)
Total Protein: 6.9 g/dL (ref 6.1–8.1)

## 2018-07-25 LAB — HEMOGLOBIN A1C
Hgb A1c MFr Bld: 8.1 % of total Hgb — ABNORMAL HIGH (ref <5.7)
Mean Plasma Glucose: 186 (calc)
eAG (mmol/L): 10.3 (calc)

## 2018-07-27 ENCOUNTER — Encounter: Payer: Self-pay | Admitting: *Deleted

## 2018-08-07 ENCOUNTER — Other Ambulatory Visit: Payer: Self-pay

## 2018-08-07 NOTE — Patient Outreach (Signed)
[facility]) Care Management  08/07/2018   Monique Cabrera 11/07/1935 [id number]  Subjective: Successful telephone call to the patient for assessment.  HIPAA verified.  The patient states that she is doing well today.  Her blood sugar this morning was 185.  She states that three week ago her blood sugar was very low it was 30.  She states that she hot, sweating and some confusion. Her daughter was with her when it happened and EMS was called.  She states that she has not had any lows in a long time.  She has seen her physician since the event and they felt she was not eating enough and taking her meds.  I discussed fasting goals, discussed food intake and importance of eating  meals and compliance, reviewed signs and symptoms of hypoglycemia.  She verbalized understanding.  She expressed to me in great lengths that she does not have a full understanding on how her meds work.  I explained to the patient that I would put in a referral to pharmacy to talk with her to answer all of her questions.  She states that she has had diabetic education class last year that she attended and was able to receive some information. She states that she is compliant with her meds.  She denies any falls.  She states that she woke up this morning with back pain that she rates at a 8/10.  She has a follow up appointment with her PCP on 10/12/18.    Current Medications:  Current Outpatient Medications  Medication Sig Dispense Refill  . amLODipine (NORVASC) 10 MG tablet Take 1 tablet (10 mg total) by mouth daily. for high blood pressure 90 tablet 1  . atorvastatin (LIPITOR) 20 MG tablet Take 20 mg by mouth daily.    . carvedilol (COREG) 25 MG tablet TAKE 1 TABLET BY MOUTH TWICE A DAY WITH A MEAL 180 tablet 1  . cloNIDine (CATAPRES) 0.1 MG tablet Take 1 tablet (0.1 mg total) by mouth at bedtime. 90 tablet 1  . clopidogrel (PLAVIX) 75 MG tablet TAKE ONE TABLET BY MOUTH ONCE DAILY 90 tablet 2  . Insulin  Glargine (LANTUS SOLOSTAR) 100 UNIT/ML Solostar Pen Inject 50 units under the skin daily for Diabetes 15 pen 3  . insulin lispro (HUMALOG KWIKPEN) 100 UNIT/ML KiwkPen 6 units breakfast, 6 units lunch & supper 5 pen 1  . Insulin Pen Needle (B-D ULTRAFINE III SHORT PEN) 31G X 8 MM MISC Use to check blood sugar every day. Dx: 11.29; 11.65 100 each 1  . ONETOUCH VERIO test strip USE TO TEST BLOOD SUGAR THREE TIMES DAILY. DX: E11.9 300 each 3  . valsartan-hydrochlorothiazide (DIOVAN-HCT) 320-25 MG tablet TAKE 1 TABLET BY MOUTH EVERY DAY 90 tablet 1   No current facility-administered medications for this visit.     Functional Status:  In your present state of health, do you have any difficulty performing the following activities: 02/06/2018 09/12/2017  Hearing? N N  Vision? N N  Difficulty concentrating or making decisions? Y N  Walking or climbing stairs? Y N  Dressing or bathing? N N  Doing errands, shopping? Y N  Preparing Food and eating ? N N  Using the Toilet? N N  In the past six months, have you accidently leaked urine? N N  Do you have problems with loss of bowel control? N N  Managing your Medications? N N  Managing your Finances? N N  Housekeeping or managing your Housekeeping? N  N  Some recent data might be hidden    Fall/Depression Screening: Fall Risk  08/07/2018 06/05/2018 06/02/2018  Falls in the past year? No No No  Number falls in past yr: - - -  Comment - - -  Injury with Fall? - - -  Risk for fall due to : - - -  Follow up - - -   PHQ 2/9 Scores 06/05/2018 02/06/2018 10/14/2017 10/06/2017 09/12/2017 09/06/2017 08/18/2017  PHQ - 2 Score 0 0 2 0 0 0 0  PHQ- 9 Score - - 3 - - - -    Assessment: Patient will continue to benefit from health coach outreach for disease management and support.  THN CM Care Plan Problem One     Most Recent Value  THN Long Term Goal   In 90 days the patient will decrease her a1c of 8.0 down by 0.2 points  THN Long Term Goal Start Date  08/07/18   Interventions for Problem One Long Term Goal  Talked about food intake and medication adherence     Plan:  RN Health Coach will contact patient in the month of September and patient agrees to next outreach.  Lazaro Arms RN, BSN, [place] Direct Dial:  [phone]  Fax: [phone]

## 2018-08-09 ENCOUNTER — Telehealth: Payer: Self-pay | Admitting: Pharmacist

## 2018-08-09 NOTE — Patient Outreach (Signed)
[facility]) Care Management  08/09/2018  MERISSA RENWICK 03/17/1935 [id number]   Patient was called regarding medication management and to answer questions per referral. Unfortunately, she did not answer the phone. HIPAA compliant message was left on her voicemail.  Plan:  Send patient an unsuccessful contact letter Call patient back in 3-5 business days.   Elayne Guerin, PharmD, [place] Clinical Pharmacist [phone]

## 2018-08-16 ENCOUNTER — Other Ambulatory Visit: Payer: Self-pay | Admitting: Pharmacist

## 2018-08-16 NOTE — Patient Outreach (Addendum)
[place] [facility]) Care Management  [place]   08/16/2018  Monique Cabrera [birth year] [id number]  Subjective: Patient was called regarding medication management and to answer questions. HIPAA identifiers were obtained. Patient is an 83 year old female with multiple medical conditions including but not limited to:  Hyperlipidemia, hypertension, Type 2 diabetes, PAD, CKD stage III, chronic hepatitis, anemia and history of CVA.   Objective:  HgA1c-8.1% LDL-84 HDL-47 TG-88 SCr-1.50 Fasting Blood Sugar this morning-97mg /dl  Encounter Medications: Outpatient Encounter Medications as of 08/16/2018  Medication Sig Note  . amLODipine (NORVASC) 10 MG tablet Take 1 tablet (10 mg total) by mouth daily. for high blood pressure   . atorvastatin (LIPITOR) 20 MG tablet Take 20 mg by mouth daily.   . carvedilol (COREG) 25 MG tablet TAKE 1 TABLET BY MOUTH TWICE A DAY WITH A MEAL   . cloNIDine (CATAPRES) 0.1 MG tablet Take 1 tablet (0.1 mg total) by mouth at bedtime. 08/16/2018: Takes in the morning  . clopidogrel (PLAVIX) 75 MG tablet TAKE ONE TABLET BY MOUTH ONCE DAILY   . Insulin Glargine (LANTUS SOLOSTAR) 100 UNIT/ML Solostar Pen Inject 50 units under the skin daily for Diabetes   . insulin lispro (HUMALOG KWIKPEN) 100 UNIT/ML KiwkPen 6 units breakfast, 6 units lunch & supper   . Insulin Pen Needle (B-D ULTRAFINE III SHORT PEN) 31G X 8 MM MISC Use to check blood sugar every day. Dx: 11.29; 11.65   . ONETOUCH VERIO test strip USE TO TEST BLOOD SUGAR THREE TIMES DAILY. DX: E11.9   . valsartan-hydrochlorothiazide (DIOVAN-HCT) 320-25 MG tablet TAKE 1 TABLET BY MOUTH EVERY DAY    No facility-administered encounter medications on file as of 08/16/2018.     Functional Status: In your present state of health, do you have any difficulty performing the following activities: 02/06/2018 09/12/2017  Hearing? N N  Vision? N N  Difficulty concentrating or making decisions? Y N  Walking or  climbing stairs? Y N  Dressing or bathing? N N  Doing errands, shopping? Y N  Preparing Food and eating ? N N  Using the Toilet? N N  In the past six months, have you accidently leaked urine? N N  Do you have problems with loss of bowel control? N N  Managing your Medications? N N  Managing your Finances? N N  Housekeeping or managing your Housekeeping? N N  Some recent data might be hidden    Fall/Depression Screening: Fall Risk  08/07/2018 06/05/2018 06/02/2018  Falls in the past year? No No No  Number falls in past yr: - - -  Comment - - -  Injury with Fall? - - -  Risk for fall due to : - - -  Follow up - - -   [facility] 2/9 Scores 06/05/2018 02/06/2018 10/14/2017 10/06/2017 09/12/2017 09/06/2017 08/18/2017  PHQ - 2 Score 0 0 2 0 0 0 0  PHQ- 9 Score - - 3 - - - -   Assessment:  Patient's medications were reviewed via telephone:  Cardiology: Atorvastatin, Amlodipine, Carvedilol, Clonidine, Clopidogrel, Valsartan-HCTZ  Endocrine: Lantus Humalog  Medication Review Findings:  Adherence- Humalog-patient reported injecting herself with Humalog 30 minutes after each meal.  Patient was re-educated on how Humalog works and was encouraged to inject herself at the first bite of her meal vs 30 minutes afterwards to help with decreasing her risk of hypoglycemia.  Hypoglycemia management was also discussed.  Patient requested educational materials on hypertension and diabetes. She is working  with [place], Lazaro Arms, RN. After consulting with Traci via telephone an educational plan was devised that will be incorporated in the patients Health Coach calls.   Plan: Close pharmacy case Route note to PCP Send patient a printed medication list out of her chart   Elayne Guerin, PharmD, [place] Clinical Pharmacist [phone]

## 2018-09-12 ENCOUNTER — Other Ambulatory Visit: Payer: Self-pay

## 2018-09-12 NOTE — Patient Outreach (Signed)
[place] [facility]) Care Management  09/12/2018   Monique Cabrera 08-23-1935 [id number]  Subjective: Telephone call to the patient for assessment. HIPAA verified.  The patient states that she is doing well.  She denies any pain or falls.  The patient reports that she did not check her blood sugar yet this morning because she had just woke up.  Her blood sugar yesterday was 165. She has not had any lows since we last talked. She states that she is taking her medications as prescribed.  When the patient was asked did she talk with the pharmacist  concerning her medications she stated that she did not remember speaking with one.  I reminded the patient that in the notes a [facility] pharmacist spoke with her on 08/16/2018 regarding information about her Humalog insulin.  Some of the information started to come back to her. She states that she feels more comfortable about taking her insulin with the information that she has learned.  The patient states that she will start her exercise program back tomorrow at the center and she is happy about that.  RN Health Coach encouraged the patient to get her flu shot and she reports that she will get one at her pharmacy on October 1st.  The patient's next appointment with her primary physician will be on the 17th of October.    Current Medications:  Current Outpatient Medications  Medication Sig Dispense Refill  . amLODipine (NORVASC) 10 MG tablet Take 1 tablet (10 mg total) by mouth daily. for high blood pressure 90 tablet 1  . atorvastatin (LIPITOR) 20 MG tablet Take 20 mg by mouth daily.    . carvedilol (COREG) 25 MG tablet TAKE 1 TABLET BY MOUTH TWICE A DAY WITH A MEAL 180 tablet 1  . cloNIDine (CATAPRES) 0.1 MG tablet Take 1 tablet (0.1 mg total) by mouth at bedtime. 90 tablet 1  . clopidogrel (PLAVIX) 75 MG tablet TAKE ONE TABLET BY MOUTH ONCE DAILY 90 tablet 2  . Insulin Glargine (LANTUS SOLOSTAR) 100 UNIT/ML Solostar Pen Inject 50 units under the  skin daily for Diabetes 15 pen 3  . insulin lispro (HUMALOG KWIKPEN) 100 UNIT/ML KiwkPen 6 units breakfast, 6 units lunch & supper 5 pen 1  . Insulin Pen Needle (B-D ULTRAFINE III SHORT PEN) 31G X 8 MM MISC Use to check blood sugar every day. Dx: 11.29; 11.65 100 each 1  . ONETOUCH VERIO test strip USE TO TEST BLOOD SUGAR THREE TIMES DAILY. DX: E11.9 300 each 3  . valsartan-hydrochlorothiazide (DIOVAN-HCT) 320-25 MG tablet TAKE 1 TABLET BY MOUTH EVERY DAY 90 tablet 1   No current facility-administered medications for this visit.     Functional Status:  In your present state of health, do you have any difficulty performing the following activities: 02/06/2018 09/12/2017  Hearing? N N  Vision? N N  Difficulty concentrating or making decisions? Y N  Walking or climbing stairs? Y N  Dressing or bathing? N N  Doing errands, shopping? Y N  Preparing Food and eating ? N N  Using the Toilet? N N  In the past six months, have you accidently leaked urine? N N  Do you have problems with loss of bowel control? N N  Managing your Medications? N N  Managing your Finances? N N  Housekeeping or managing your Housekeeping? N N  Some recent data might be hidden    Fall/Depression Screening: Fall Risk  09/12/2018 08/07/2018 06/05/2018  Falls in the past year?  No No No  Number falls in past yr: - - -  Comment - - -  Injury with Fall? - - -  Risk for fall due to : - - -  Follow up - - -   PHQ 2/9 Scores 06/05/2018 02/06/2018 10/14/2017 10/06/2017 09/12/2017 09/06/2017 08/18/2017  PHQ - 2 Score 0 0 2 0 0 0 0  PHQ- 9 Score - - 3 - - - -    Assessment: Patient will continue to benefit from health coach outreach for disease management and support.   THN CM Care Plan Problem One     Most Recent Value  THN Long Term Goal   In 90 days the patient will decrease her a1c of 8.0 down by 0.2 points  THN Long Term Goal Start Date  09/12/18  Interventions for Problem One Long Term Goal  discussed food intake , cbg  readings, medication adherence and exercsie       Plan: RN Health Coach will contact patient in the month of December and patient agrees to next outreach.  Lazaro Arms RN, BSN, [place] Direct Dial:  [phone]  Fax: [phone]

## 2018-10-10 ENCOUNTER — Other Ambulatory Visit: Payer: Self-pay | Admitting: Internal Medicine

## 2018-10-12 ENCOUNTER — Encounter: Payer: Self-pay | Admitting: Internal Medicine

## 2018-10-12 ENCOUNTER — Ambulatory Visit (INDEPENDENT_AMBULATORY_CARE_PROVIDER_SITE_OTHER): Payer: Medicare Other | Admitting: Internal Medicine

## 2018-10-12 VITALS — BP 120/60 | HR 61 | Temp 98.5°F | Ht 65.0 in | Wt 156.0 lb

## 2018-10-12 DIAGNOSIS — E1169 Type 2 diabetes mellitus with other specified complication: Secondary | ICD-10-CM | POA: Diagnosis not present

## 2018-10-12 DIAGNOSIS — E1165 Type 2 diabetes mellitus with hyperglycemia: Secondary | ICD-10-CM

## 2018-10-12 DIAGNOSIS — G4762 Sleep related leg cramps: Secondary | ICD-10-CM

## 2018-10-12 DIAGNOSIS — Z23 Encounter for immunization: Secondary | ICD-10-CM

## 2018-10-12 DIAGNOSIS — I10 Essential (primary) hypertension: Secondary | ICD-10-CM | POA: Diagnosis not present

## 2018-10-12 DIAGNOSIS — Z7189 Other specified counseling: Secondary | ICD-10-CM

## 2018-10-12 DIAGNOSIS — E785 Hyperlipidemia, unspecified: Secondary | ICD-10-CM

## 2018-10-12 DIAGNOSIS — E1129 Type 2 diabetes mellitus with other diabetic kidney complication: Secondary | ICD-10-CM

## 2018-10-12 DIAGNOSIS — IMO0002 Reserved for concepts with insufficient information to code with codable children: Secondary | ICD-10-CM

## 2018-10-12 MED ORDER — ZOSTER VAC RECOMB ADJUVANTED 50 MCG/0.5ML IM SUSR: 0.5000 mL | Freq: Once | INTRAMUSCULAR | 1 refills | Status: AC

## 2018-10-12 NOTE — Patient Instructions (Addendum)
Try taking centrum silver multivitamin once a day.  Generic is ok.      Come on 10/30 or 10/31 for fasting labs.    I'd like you to get your shingles shots done at the pharmacy.  I have sent a prescription to the pharmacy.

## 2018-10-12 NOTE — Progress Notes (Signed)
Location:  [facility] clinic Provider:  Keimora Swartout L. Mariea Cabrera, D.O., C.M.D.  Code Status:DNR discussed and completed today Goals of Care:  Advanced Directives 06/05/2018  Does Patient Have a Medical Advance Directive? No  Would patient like information on creating a medical advance directive? No - Patient declined  Pre-existing out of facility DNR order (yellow form or pink MOST form) -   Chief Complaint  Patient presents with  . Medical Management of Chronic Issues    36mth follow-up    HPI: Patient is a 83 y.o. female seen today for medical management of chronic diseases.    Says doing ok as a whole.  Has not had labs yet b/c not three months since last hba1c.    Having cramps in legs at night.    Is eating her three meals.   Sometimes snacks.  Eats small amounts.  Asks about taking a vitamin.  Suggested senior MVI.  No low sugars lately.  Her pastor's wife explained to her the importance of taking humalog right after eating not waiting.  She listened to her and understood it better.    Says she is drinking enough water.    Already had her flu shot.  She's back to going to her program and seems much happier and content.  Also had her children over for the weekend.  They go out to eat, and now she's bowling with the group.  Discussed about importance of shingles vaccine.    Past Medical History:  Diagnosis Date  . Acute upper respiratory infections of unspecified site   . Anemia   . Anemia, unspecified   . Atherosclerosis of native arteries of the extremities, unspecified   . Chest pain, unspecified   . Chronic kidney disease (CKD), stage II (mild)   . Diabetes mellitus   . Diarrhea   . Disorder of bone and cartilage, unspecified   . DM (diabetes mellitus) type II controlled with renal manifestation ([place])   . Herpes zoster with other nervous system complications(053.19)   . Hypercalcemia   . Hypertension   . Hypertensive renal disease, benign   . Nonspecific reaction to  tuberculin skin test without active tuberculosis(795.51)   . Nonspecific tuberculin test reaction   . Other and unspecified hyperlipidemia   . Pain in joint, lower leg   . Peripheral arterial disease ([place])   . Postherpetic neuralgia   . Proteinuria   . Stroke ([place]) 01/2017  . Type II or unspecified type diabetes mellitus with renal manifestations, not stated as uncontrolled(250.40)   . Type II or unspecified type diabetes mellitus with renal manifestations, uncontrolled(250.42)   . Unspecified disorder of kidney and ureter   . Unspecified essential hypertension     Past Surgical History:  Procedure Laterality Date  . hysterectomy    . INCISION AND DRAINAGE Left 05/27/14   sebacous cyst, ear  . PRP Left    Dr. Ricki Cabrera  . removal of cyst from hand    . removal of tumor from foot    . TONSILLECTOMY      Allergies  Allergen Reactions  . Invokana [Canagliflozin] Other (See Comments)    Vagina itching and swelling Vaginal Itching and irritation   . Jardiance [Empagliflozin] Itching and Other (See Comments)    Vaginal itching and swelling    Outpatient Encounter Medications as of 10/12/2018  Medication Sig  . amLODipine (NORVASC) 10 MG tablet Take 1 tablet (10 mg total) by mouth daily. for high blood pressure  . atorvastatin (LIPITOR)  20 MG tablet Take 20 mg by mouth daily.  . carvedilol (COREG) 25 MG tablet TAKE 1 TABLET BY MOUTH TWICE A DAY WITH A MEAL  . cloNIDine (CATAPRES) 0.1 MG tablet Take 1 tablet (0.1 mg total) by mouth at bedtime.  . clopidogrel (PLAVIX) 75 MG tablet TAKE ONE TABLET BY MOUTH ONCE DAILY  . Insulin Glargine (LANTUS SOLOSTAR) 100 UNIT/ML Solostar Pen INJECT 50 UNITS UNDER THE SKIN DAILY FOR DIABETES  . insulin lispro (HUMALOG KWIKPEN) 100 UNIT/ML KiwkPen 6 units breakfast, 6 units lunch & supper  . Insulin Pen Needle (B-D ULTRAFINE III SHORT PEN) 31G X 8 MM MISC Use to check blood sugar every day. Dx: 11.29; 11.65  . ONETOUCH VERIO test strip USE TO TEST  BLOOD SUGAR THREE TIMES DAILY. DX: E11.9  . valsartan-hydrochlorothiazide (DIOVAN-HCT) 320-25 MG tablet TAKE 1 TABLET BY MOUTH EVERY DAY  . Zoster Vaccine Adjuvanted [facility]) injection Inject 0.5 mLs into the muscle once for 1 dose.   No facility-administered encounter medications on file as of 10/12/2018.     Review of Systems:  Review of Systems  Constitutional: Negative for chills and fever.  HENT: Negative for congestion.   Eyes: Negative for blurred vision.       Glasses  Respiratory: Negative for cough and shortness of breath.   Cardiovascular: Positive for claudication. Negative for chest pain, palpitations, orthopnea, leg swelling and PND.  Gastrointestinal: Negative for abdominal pain.  Genitourinary: Negative for dysuria.  Musculoskeletal: Negative for falls.  Neurological: Positive for tingling and sensory change. Negative for dizziness and loss of consciousness.  Endo/Heme/Allergies: Does not bruise/bleed easily.  Psychiatric/Behavioral: Positive for memory loss. Negative for depression. The patient is not nervous/anxious.        Without hypoglycemia, cognition much clearer    Health Maintenance  Topic Date Due  . HEMOGLOBIN A1C  01/24/2019  . OPHTHALMOLOGY EXAM  01/26/2019  . FOOT EXAM  02/06/2019  . INFLUENZA VACCINE  Completed  . DEXA SCAN  Completed  . PNA vac Low Risk Adult  Completed    Physical Exam: Vitals:   10/12/18 0838  BP: 120/60  Pulse: 61  Temp: 98.5 F (36.9 C)  TempSrc: Oral  SpO2: 95%  Weight: 156 lb (70.8 kg)  Height: 5\' 5"  (1.651 m)   Body mass index is 25.96 kg/m. Physical Exam  Constitutional: No distress.  Cardiovascular: Normal rate, regular rhythm, normal heart sounds and intact distal pulses.  Pulmonary/Chest: Effort normal and breath sounds normal. No respiratory distress.  Abdominal: Bowel sounds are normal.  Musculoskeletal: Normal range of motion.  Neurological: She is alert.  Skin: Skin is warm and dry.  Psychiatric:  She has a normal mood and affect.    Labs reviewed: Basic Metabolic Panel: Recent Labs    01/20/18 0906 04/20/18 0909 07/24/18 0823  NA 136 138 137  K 3.9 4.3 4.0  CL 106 107 105  CO2 24 25 24   GLUCOSE 254* 146* 166*  BUN 28* 33* 35*  CREATININE 1.36* 1.47* 1.50*  CALCIUM 10.1 10.1 10.1   Liver Function Tests: Recent Labs    01/20/18 0906 04/20/18 0909 07/24/18 0823  AST 15 17 15   ALT 16 15 15   BILITOT 0.4 0.5 0.5  PROT 6.9 6.9 6.9   No results for input(s): LIPASE, AMYLASE in the last 8760 hours. No results for input(s): AMMONIA in the last 8760 hours. CBC: No results for input(s): WBC, NEUTROABS, HGB, HCT, MCV, PLT in the last 8760 hours. Lipid Panel: Recent  Labs    10/20/17 0928  CHOL 159  HDL 57  LDLCALC 84  TRIG 88  CHOLHDL 2.8   Lab Results  Component Value Date   HGBA1C 8.1 (H) 07/24/2018    Procedures since last visit: No results found.  Assessment/Plan 1. Type II diabetes mellitus with renal manifestations, uncontrolled ([place]) - not yet due for labs and did not bring meter -no lows lately - check labs end of month when due - Hemoglobin A1c; Future - COMPLETE METABOLIC PANEL WITH GFR; Future - CBC with Differential/Platelet; Future  2. Essential hypertension, benign -bp at goal, cont same regimen  3. Nocturnal leg cramps -new concern, ? Electrolyte issue--check with labs, more likely due to poor arterial circulation and need for stent that she has refused in her leg  4. Hyperlipidemia associated with type 2 diabetes mellitus ([place]) -cont lipitor therapy - Lipid panel; Future  5. Need for shingles vaccine - Zoster Vaccine Adjuvanted [facility]) injection; Inject 0.5 mLs into the muscle once for 1 dose.  Dispense: 0.5 mL; Refill: 1  6. ACP (advance care planning) Last ACP Note 07/14/2018 to 10/12/2018       ACP (Advance Care Planning) by [name], DO at 10/12/2018  8:30 AM    Date of Service   Author Author Type Status Note Type  File Time  10/12/2018 Addend [name], DO Physician Signed ACP (Advance Care Planning) 10/12/2018             Discussed code status with Monique Cabrera. She reports that if she had a cardiopulmonary arrest, she would NOT want resuscitation.  Her husband required several sessions of CPR in his 64s with sepsis and did not survive.  She believes that God puts doctors here to help them with their problems, but if it's time for her to move on, he decides this and she wants to be left to pass away naturally.   [place] form completed.  Copy will be scanned in the documents and original given to patient to keep on her refrigerator or with her meds.  I reminded her the importance of discussing this again with her children and filling in her living will.    Code status:  DNR  17 mins spent on ACP.              Labs/tests ordered:   Orders Placed This Encounter  Procedures  . Hemoglobin A1c    Standing Status:   Future    Standing Expiration Date:   10/13/2019  . COMPLETE METABOLIC PANEL WITH GFR    Standing Status:   Future    Standing Expiration Date:   10/13/2019  . CBC with Differential/Platelet    Standing Status:   Future    Standing Expiration Date:   10/13/2019  . Lipid panel    Standing Status:   Future    Standing Expiration Date:   10/13/2019   Next appt:  02/07/2019   Eliyah Mcshea L. Shawndale Kilpatrick, D.O. [facility] [address], [place] [address] Cell Phone (Mon-Fri 8am-5pm):  [phone] On Call:  [phone] & follow prompts after 5pm & weekends Office Phone:  [phone] Office Fax:  [phone]

## 2018-10-12 NOTE — ACP (Advance Care Planning) (Signed)
Discussed code status with Monique Cabrera. She reports that if she had a cardiopulmonary arrest, she would NOT want resuscitation.  Her husband required several sessions of CPR in his 59s with sepsis and did not survive.  She believes that God puts doctors here to help them with their problems, but if it's time for her to move on, he decides this and she wants to be left to pass away naturally.   [place] form completed.  Copy will be scanned in the documents and original given to patient to keep on her refrigerator or with her meds.  I reminded her the importance of discussing this again with her children and filling in her living will.    Code status:  DNR  17 mins spent on ACP.

## 2018-10-24 ENCOUNTER — Other Ambulatory Visit: Payer: Self-pay | Admitting: Internal Medicine

## 2018-10-24 DIAGNOSIS — E1165 Type 2 diabetes mellitus with hyperglycemia: Principal | ICD-10-CM

## 2018-10-24 DIAGNOSIS — N183 Chronic kidney disease, stage 3 (moderate): Principal | ICD-10-CM

## 2018-10-24 DIAGNOSIS — Z794 Long term (current) use of insulin: Principal | ICD-10-CM

## 2018-10-24 DIAGNOSIS — IMO0002 Reserved for concepts with insufficient information to code with codable children: Secondary | ICD-10-CM

## 2018-10-24 DIAGNOSIS — E1122 Type 2 diabetes mellitus with diabetic chronic kidney disease: Secondary | ICD-10-CM

## 2018-10-25 ENCOUNTER — Other Ambulatory Visit: Payer: Medicare Other

## 2018-10-25 DIAGNOSIS — E1169 Type 2 diabetes mellitus with other specified complication: Secondary | ICD-10-CM

## 2018-10-25 DIAGNOSIS — E785 Hyperlipidemia, unspecified: Secondary | ICD-10-CM | POA: Diagnosis not present

## 2018-10-25 DIAGNOSIS — E1129 Type 2 diabetes mellitus with other diabetic kidney complication: Secondary | ICD-10-CM

## 2018-10-25 DIAGNOSIS — IMO0002 Reserved for concepts with insufficient information to code with codable children: Secondary | ICD-10-CM

## 2018-10-25 DIAGNOSIS — E1165 Type 2 diabetes mellitus with hyperglycemia: Secondary | ICD-10-CM

## 2018-10-26 ENCOUNTER — Encounter: Payer: Self-pay | Admitting: *Deleted

## 2018-10-26 LAB — CBC WITH DIFFERENTIAL/PLATELET
Basophils Absolute: 42 cells/uL (ref 0–200)
Basophils Relative: 0.5 %
Eosinophils Absolute: 92 cells/uL (ref 15–500)
Eosinophils Relative: 1.1 %
HCT: 32.5 % — ABNORMAL LOW (ref 35.0–45.0)
Hemoglobin: 11.2 g/dL — ABNORMAL LOW (ref 11.7–15.5)
Lymphs Abs: 1495 cells/uL (ref 850–3900)
MCH: 31.2 pg (ref 27.0–33.0)
MCHC: 34.5 g/dL (ref 32.0–36.0)
MCV: 90.5 fL (ref 80.0–100.0)
MPV: 10.3 fL (ref 7.5–12.5)
Monocytes Relative: 12.2 %
Neutro Abs: 5746 cells/uL (ref 1500–7800)
Neutrophils Relative %: 68.4 %
Platelets: 232 10*3/uL (ref 140–400)
RBC: 3.59 10*6/uL — ABNORMAL LOW (ref 3.80–5.10)
RDW: 12.1 % (ref 11.0–15.0)
Total Lymphocyte: 17.8 %
WBC mixed population: 1025 cells/uL — ABNORMAL HIGH (ref 200–950)
WBC: 8.4 10*3/uL (ref 3.8–10.8)

## 2018-10-26 LAB — COMPLETE METABOLIC PANEL WITH GFR
AG Ratio: 1.4 (calc) (ref 1.0–2.5)
ALT: 11 U/L (ref 6–29)
AST: 15 U/L (ref 10–35)
Albumin: 3.8 g/dL (ref 3.6–5.1)
Alkaline phosphatase (APISO): 66 U/L (ref 33–130)
BUN/Creatinine Ratio: 15 (calc) (ref 6–22)
BUN: 27 mg/dL — ABNORMAL HIGH (ref 7–25)
CO2: 26 mmol/L (ref 20–32)
Calcium: 9.9 mg/dL (ref 8.6–10.4)
Chloride: 101 mmol/L (ref 98–110)
Creat: 1.75 mg/dL — ABNORMAL HIGH (ref 0.60–0.88)
GFR, Est African American: 31 mL/min/{1.73_m2} — ABNORMAL LOW (ref >=60)
GFR, Est Non African American: 27 mL/min/{1.73_m2} — ABNORMAL LOW (ref >=60)
Globulin: 2.7 g/dL (calc) (ref 1.9–3.7)
Glucose, Bld: 219 mg/dL — ABNORMAL HIGH (ref 65–99)
Potassium: 4 mmol/L (ref 3.5–5.3)
Sodium: 137 mmol/L (ref 135–146)
Total Bilirubin: 0.6 mg/dL (ref 0.2–1.2)
Total Protein: 6.5 g/dL (ref 6.1–8.1)

## 2018-10-26 LAB — LIPID PANEL
Cholesterol: 123 mg/dL (ref <200)
HDL: 49 mg/dL — ABNORMAL LOW (ref >50)
LDL Cholesterol (Calc): 58 mg/dL (calc)
Non-HDL Cholesterol (Calc): 74 mg/dL (calc) (ref <130)
Total CHOL/HDL Ratio: 2.5 (calc) (ref <5.0)
Triglycerides: 81 mg/dL (ref <150)

## 2018-10-26 LAB — HEMOGLOBIN A1C
Hgb A1c MFr Bld: 8.7 % of total Hgb — ABNORMAL HIGH (ref <5.7)
Mean Plasma Glucose: 203 (calc)
eAG (mmol/L): 11.2 (calc)

## 2018-12-12 ENCOUNTER — Other Ambulatory Visit: Payer: Self-pay

## 2018-12-12 NOTE — Patient Outreach (Signed)
[place] [facility]) Care Management  12/12/2018   Monique Cabrera 1935/09/20 [id number]  Subjective: Successful telephone call to the patient for assessment,  HIPAA verified.  The patient states that she is doing fine.  She denies any pain or falls.  She states that she is taking her medications.  She does go on trips with the center and she states that when she goes on a trip out of town and eats she will not take her insulin with her.  She said " I will not be at the table and take my medication.  I don't want to take it with me."  Reinforced with the patient about diet and food choices.  She verbalized understanding.  She states that her blood sugar this morning was 195.  Her a1c was checked in October and it has gone up from 8.0 to 8.7.  She states that she does monitor food and not sure why it is going up.  Her next appointment with her physician is 01/28/19.    Current Medications:  Current Outpatient Medications  Medication Sig Dispense Refill  . amLODipine (NORVASC) 10 MG tablet Take 1 tablet (10 mg total) by mouth daily. for high blood pressure 90 tablet 1  . atorvastatin (LIPITOR) 20 MG tablet Take 20 mg by mouth daily.    . carvedilol (COREG) 25 MG tablet TAKE 1 TABLET BY MOUTH TWICE A DAY WITH A MEAL 180 tablet 1  . cloNIDine (CATAPRES) 0.1 MG tablet Take 1 tablet (0.1 mg total) by mouth at bedtime. 90 tablet 1  . clopidogrel (PLAVIX) 75 MG tablet TAKE ONE TABLET BY MOUTH ONCE DAILY 90 tablet 2  . Insulin Glargine (LANTUS SOLOSTAR) 100 UNIT/ML Solostar Pen INJECT 50 UNITS UNDER THE SKIN DAILY FOR DIABETES 15 mL 1  . insulin lispro (HUMALOG KWIKPEN) 100 UNIT/ML KiwkPen INJECT 6 UNITS BREAKFAST, 6 UNITS LUNCH & SUPPER 15 pen 1  . Insulin Pen Needle (B-D ULTRAFINE III SHORT PEN) 31G X 8 MM MISC Use to check blood sugar every day. Dx: 11.29; 11.65 100 each 1  . ONETOUCH VERIO test strip USE TO TEST BLOOD SUGAR THREE TIMES DAILY. DX: E11.9 300 each 3  .  valsartan-hydrochlorothiazide (DIOVAN-HCT) 320-25 MG tablet TAKE 1 TABLET BY MOUTH EVERY DAY 90 tablet 1   No current facility-administered medications for this visit.     Functional Status:  In your present state of health, do you have any difficulty performing the following activities: 02/06/2018  Hearing? N  Vision? N  Difficulty concentrating or making decisions? Y  Walking or climbing stairs? Y  Dressing or bathing? N  Doing errands, shopping? Y  Preparing Food and eating ? N  Using the Toilet? N  In the past six months, have you accidently leaked urine? N  Do you have problems with loss of bowel control? N  Managing your Medications? N  Managing your Finances? N  Housekeeping or managing your Housekeeping? N  Some recent data might be hidden    Fall/Depression Screening: Fall Risk  12/12/2018 12/12/2018 10/12/2018  Falls in the past year? 0 0 No  Number falls in past yr: - - -  Comment - - -  Injury with Fall? - - -  Risk for fall due to : - - -  Follow up - - -   PHQ 2/9 Scores 10/12/2018 06/05/2018 02/06/2018 10/14/2017 10/06/2017 09/12/2017 09/06/2017  PHQ - 2 Score 0 0 0 2 0 0 0  PHQ- 9 Score - - -  3 - - -    Assessment: Patient will continue to benefit from health coach outreach for disease management and support. THN CM Care Plan Problem One     Most Recent Value  THN Long Term Goal   In 60 days the patient will decrease her a1c of 8.7 down by 0.2 points  THN Long Term Goal Start Date  12/12/18  Interventions for Problem One Long Term Goal  Reinterated to the patient about dietand medication adherence      Plan: [place] will contact patient in the month of February and patient agrees to next outreach.   Lazaro Arms RN, BSN, [place] Direct Dial:  [phone]  Fax: [phone]

## 2018-12-26 ENCOUNTER — Other Ambulatory Visit: Payer: Self-pay | Admitting: Internal Medicine

## 2019-01-07 ENCOUNTER — Other Ambulatory Visit: Payer: Self-pay | Admitting: Internal Medicine

## 2019-01-08 ENCOUNTER — Telehealth: Payer: Self-pay | Admitting: *Deleted

## 2019-01-08 DIAGNOSIS — E1169 Type 2 diabetes mellitus with other specified complication: Secondary | ICD-10-CM

## 2019-01-08 DIAGNOSIS — E785 Hyperlipidemia, unspecified: Secondary | ICD-10-CM

## 2019-01-08 DIAGNOSIS — E1165 Type 2 diabetes mellitus with hyperglycemia: Principal | ICD-10-CM

## 2019-01-08 DIAGNOSIS — E1129 Type 2 diabetes mellitus with other diabetic kidney complication: Secondary | ICD-10-CM

## 2019-01-08 DIAGNOSIS — IMO0002 Reserved for concepts with insufficient information to code with codable children: Secondary | ICD-10-CM

## 2019-01-08 DIAGNOSIS — I10 Essential (primary) hypertension: Secondary | ICD-10-CM

## 2019-01-08 NOTE — Telephone Encounter (Signed)
Let' schedule her for cbc, cmp, flp, hba1c the same day as her wellness visit if it's at a reasonable time for her to come fasting.

## 2019-01-08 NOTE — Telephone Encounter (Signed)
Patient called and stated that she has an appointment on 02/15/2019 with you for a follow up and wants to know if she can get fasting lab work prior to her appointment. Stated that she normally gets it before and then can discuss at appointment. Please Advise.     Also has a AWV with [name] on 02/07/2019

## 2019-01-08 NOTE — Telephone Encounter (Signed)
Patient notified and agreed. Appointment scheduled for 02/07/2019 and lab orders placed.

## 2019-01-15 ENCOUNTER — Other Ambulatory Visit: Payer: Self-pay | Admitting: Internal Medicine

## 2019-01-15 DIAGNOSIS — E1121 Type 2 diabetes mellitus with diabetic nephropathy: Secondary | ICD-10-CM | POA: Diagnosis not present

## 2019-01-15 DIAGNOSIS — I129 Hypertensive chronic kidney disease with stage 1 through stage 4 chronic kidney disease, or unspecified chronic kidney disease: Secondary | ICD-10-CM | POA: Diagnosis not present

## 2019-01-15 DIAGNOSIS — I639 Cerebral infarction, unspecified: Secondary | ICD-10-CM | POA: Diagnosis not present

## 2019-01-15 DIAGNOSIS — N183 Chronic kidney disease, stage 3 (moderate): Secondary | ICD-10-CM | POA: Diagnosis not present

## 2019-01-15 DIAGNOSIS — E1122 Type 2 diabetes mellitus with diabetic chronic kidney disease: Secondary | ICD-10-CM | POA: Diagnosis not present

## 2019-02-07 ENCOUNTER — Ambulatory Visit (INDEPENDENT_AMBULATORY_CARE_PROVIDER_SITE_OTHER): Payer: Medicare Other | Admitting: Family

## 2019-02-07 ENCOUNTER — Ambulatory Visit: Payer: Self-pay

## 2019-02-07 ENCOUNTER — Other Ambulatory Visit: Payer: Medicare Other

## 2019-02-07 ENCOUNTER — Encounter: Payer: Self-pay | Admitting: Family

## 2019-02-07 VITALS — BP 124/64 | HR 58 | Temp 98.2°F | Resp 18 | Ht 65.0 in | Wt 146.4 lb

## 2019-02-07 DIAGNOSIS — E1165 Type 2 diabetes mellitus with hyperglycemia: Secondary | ICD-10-CM | POA: Diagnosis not present

## 2019-02-07 DIAGNOSIS — IMO0002 Reserved for concepts with insufficient information to code with codable children: Secondary | ICD-10-CM

## 2019-02-07 DIAGNOSIS — I10 Essential (primary) hypertension: Secondary | ICD-10-CM

## 2019-02-07 DIAGNOSIS — E1169 Type 2 diabetes mellitus with other specified complication: Secondary | ICD-10-CM | POA: Diagnosis not present

## 2019-02-07 DIAGNOSIS — E785 Hyperlipidemia, unspecified: Secondary | ICD-10-CM | POA: Diagnosis not present

## 2019-02-07 DIAGNOSIS — E1129 Type 2 diabetes mellitus with other diabetic kidney complication: Secondary | ICD-10-CM

## 2019-02-07 DIAGNOSIS — Z0000 Encounter for general adult medical examination without abnormal findings: Secondary | ICD-10-CM | POA: Diagnosis not present

## 2019-02-07 NOTE — Progress Notes (Signed)
Subjective:   Monique Cabrera is a 84 y.o. female who presents for Medicare Annual (Subsequent) preventive examination.  Review of Systems:   Cardiac Risk Factors include: diabetes mellitus;advanced age (>6men, >68 women);hypertension;dyslipidemia     Objective:     Vitals: BP 124/64   Pulse (!) 58   Temp 98.2 F (36.8 C) (Oral)   Resp 18   Ht 5\' 5"  (1.651 m)   Wt 146 lb 6.4 oz (66.4 kg)   SpO2 98%   BMI 24.36 kg/m   Body mass index is 24.36 kg/m.  Advanced Directives 02/07/2019 06/05/2018 02/06/2018 02/06/2018 08/18/2017 07/07/2017 06/27/2017  Does Patient Have a Medical Advance Directive? Yes No No No No No No  Type of Advance Directive Out of facility DNR (pink MOST or yellow form) - - - - - -  Does patient want to make changes to medical advance directive? No - Patient declined - - - - - -  Would patient like information on creating a medical advance directive? - No - Patient declined No - Patient declined Yes (MAU/Ambulatory/Procedural Areas - Information given) Yes (MAU/Ambulatory/Procedural Areas - Information given) - -  Pre-existing out of facility DNR order (yellow form or pink MOST form) Yellow form placed in chart (order not valid for inpatient use) - - - - - -    Tobacco Social History   Tobacco Use  Smoking Status Former Smoker  . Types: Cigarettes  Smokeless Tobacco Never Used  Tobacco Comment   Quit about age 49      Counseling given: Not Answered Comment: Quit about age 43    Clinical Intake:  Pre-visit preparation completed: No  Pain : No/denies pain     BMI - recorded: 24.36 Nutritional Status: BMI of 19-24  Normal Nutritional Risks: None Diabetes: Yes CBG done?: No Did pt. bring in CBG monitor from home?: No Glucose Meter Downloaded?: No  How often do you need to have someone help you when you read instructions, pamphlets, or other written materials from your doctor or pharmacy?: 1 - Never What is the last grade level you completed in  school?: 2 years college   Interpreter Needed?: No  Information entered by :: Verdis Koval FNP-C  Past Medical History:  Diagnosis Date  . Acute upper respiratory infections of unspecified site   . Anemia   . Anemia, unspecified   . Atherosclerosis of native arteries of the extremities, unspecified   . Chest pain, unspecified   . Chronic kidney disease (CKD), stage II (mild)   . Diabetes mellitus   . Diarrhea   . Disorder of bone and cartilage, unspecified   . DM (diabetes mellitus) type II controlled with renal manifestation ([place])   . Herpes zoster with other nervous system complications(053.19)   . Hypercalcemia   . Hypertension   . Hypertensive renal disease, benign   . Nonspecific reaction to tuberculin skin test without active tuberculosis(795.51)   . Nonspecific tuberculin test reaction   . Other and unspecified hyperlipidemia   . Pain in joint, lower leg   . Peripheral arterial disease ([place])   . Postherpetic neuralgia   . Proteinuria   . Stroke ([place]) 01/2017  . Type II or unspecified type diabetes mellitus with renal manifestations, not stated as uncontrolled(250.40)   . Type II or unspecified type diabetes mellitus with renal manifestations, uncontrolled(250.42)   . Unspecified disorder of kidney and ureter   . Unspecified essential hypertension    Past Surgical History:  Procedure Laterality Date  .  hysterectomy    . INCISION AND DRAINAGE Left 05/27/14   sebacous cyst, ear  . PRP Left    Dr. Ricki Miller  . removal of cyst from hand    . removal of tumor from foot    . TONSILLECTOMY     Family History  Problem Relation Age of Onset  . Diabetes Mother   . Diabetes Father   . Diabetes Sister   . Diabetes Sister    Social History   Socioeconomic History  . Marital status: Widowed    Spouse name: Not on file  . Number of children: 6  . Years of education: 79  . Highest education level: Not on file  Occupational History    Comment: retired, [employer]  Social  Needs  . Financial resource strain: Not hard at all  . Food insecurity:    Worry: Never true    Inability: Never true  . Transportation needs:    Medical: Yes    Non-medical: Yes  Tobacco Use  . Smoking status: Former Smoker    Types: Cigarettes  . Smokeless tobacco: Never Used  . Tobacco comment: Quit about age 8   Substance and Sexual Activity  . Alcohol use: No    Alcohol/week: 0.0 standard drinks  . Drug use: No  . Sexual activity: Never  Lifestyle  . Physical activity:    Days per week: 0 days    Minutes per session: 0 min  . Stress: Not at all  Relationships  . Social connections:    Talks on phone: More than three times a week    Gets together: Twice a week    Attends religious service: 1 to 4 times per year    Active member of club or organization: No    Attends meetings of clubs or organizations: Never    Relationship status: Widowed  Other Topics Concern  . Not on file  Social History Narrative   Lives alone, son there occass   Caffeine- coffee 2-3 cups daily, soda off and on    Outpatient Encounter Medications as of 02/07/2019  Medication Sig  . amLODipine (NORVASC) 10 MG tablet TAKE 1 TABLET (10 MG TOTAL) BY MOUTH DAILY. FOR HIGH BLOOD PRESSURE  . atorvastatin (LIPITOR) 20 MG tablet Take 20 mg by mouth daily.  . carvedilol (COREG) 25 MG tablet TAKE 1 TABLET BY MOUTH TWICE A DAY WITH A MEAL  . cloNIDine (CATAPRES) 0.1 MG tablet Take 1 tablet (0.1 mg total) by mouth at bedtime.  . clopidogrel (PLAVIX) 75 MG tablet TAKE ONE TABLET BY MOUTH ONCE DAILY  . Insulin Glargine (LANTUS) 100 UNIT/ML Solostar Pen INJECT 50 UNITS UNDER THE SKIN DAILY FOR DIABETES  . insulin lispro (HUMALOG KWIKPEN) 100 UNIT/ML KiwkPen INJECT 6 UNITS BREAKFAST, 6 UNITS LUNCH & SUPPER  . Insulin Pen Needle (B-D ULTRAFINE III SHORT PEN) 31G X 8 MM MISC Use to check blood sugar every day. Dx: 11.29; 11.65  . ONETOUCH VERIO test strip USE TO TEST BLOOD SUGAR THREE TIMES DAILY. DX: E11.9  .  valsartan-hydrochlorothiazide (DIOVAN-HCT) 320-25 MG tablet TAKE 1 TABLET BY MOUTH EVERY DAY   No facility-administered encounter medications on file as of 02/07/2019.     Activities of Daily Living In your present state of health, do you have any difficulty performing the following activities: 02/07/2019  Hearing? N  Vision? Y  Comment follows up with Opthalmology   Difficulty concentrating or making decisions? Y  Comment forgetful sometimes   Walking or climbing stairs?  N  Dressing or bathing? N  Doing errands, shopping? N  Preparing Food and eating ? N  Using the Toilet? N  In the past six months, have you accidently leaked urine? N  Do you have problems with loss of bowel control? N  Managing your Medications? N  Managing your Finances? N  Housekeeping or managing your Housekeeping? N  Some recent data might be hidden    Patient Care Team: [name], DO as PCP - General (Geriatric Medicine) Estanislado Emms, MD as Consulting Physician (Nephrology) Adrian Prows, MD as Consulting Physician (Cardiology) Lazaro Arms, RN as [place] Management Rutherford Guys, MD as Consulting Physician (Ophthalmology)    Assessment:   This is a routine wellness examination for [place].  Exercise Activities and Dietary recommendations Current Exercise Habits: Structured exercise class, Type of exercise: Other - see comments;yoga;walking(chair exercise ), Time (Minutes): 60, Frequency (Times/Week): 1, Weekly Exercise (Minutes/Week): 60, Intensity: Moderate, Exercise limited by: None identified  Goals    . Exercise 3x per week (30 min per time)     Patient would like to do chair exercises and walk 2-3 times a week.    . Weight (lb) < 135 lb (61.2 kg)     Starting 01/06/2017, I will attempt to decrease my current weight of 147 lb to my goal weight of 135 lb.        Fall Risk Fall Risk  02/07/2019 12/12/2018 12/12/2018 10/12/2018 09/12/2018  Falls in the past year? 0 0 0  No No  Number falls in past yr: 0 - - - -  Comment - - - - -  Injury with Fall? 0 - - - -  Risk for fall due to : - - - - -  Follow up - - - - -   Is the patient's home free of loose throw rugs in walkways, pet beds, electrical cords, etc?   no      Grab bars in the bathroom? yes      Handrails on the stairs?   yes      Adequate lighting?   yes   Depression Screen PHQ 2/9 Scores 02/07/2019 10/12/2018 06/05/2018 02/06/2018  PHQ - 2 Score 0 0 0 0  PHQ- 9 Score - - - -     Cognitive Function MMSE - Mini Mental State Exam 02/07/2019 02/06/2018 01/06/2017  Not completed: - (No Data) -  Orientation to time 5 5 5   Orientation to Place 5 5 5   Registration 3 3 3   Attention/ Calculation 5 5 5   Recall 3 2 3   Language- name 2 objects 2 2 2   Language- repeat 1 1 1   Language- follow 3 step command 3 3 3   Language- read & follow direction 1 1 1   Write a sentence 1 1 1   Copy design 0 1 1  Total score 29 29 30     Immunization History  Administered Date(s) Administered  . Influenza, High Dose Seasonal PF 09/30/2018  . Influenza,inj,Quad PF,6+ Mos 09/24/2013, 09/19/2014, 09/13/2016  . Influenza-Unspecified 11/22/2009, 10/01/2011, 09/10/2015, 09/26/2017  . PPD Test 01/12/2010  . Pneumococcal Conjugate-13 11/24/2015  . Pneumococcal Polysaccharide-23 12/27/2005    Qualifies for Shingles Vaccine? Had shingles  Screening Tests Health Maintenance  Topic Date Due  . OPHTHALMOLOGY EXAM  01/26/2019  . FOOT EXAM  02/06/2019  . HEMOGLOBIN A1C  04/26/2019  . INFLUENZA VACCINE  Completed  . DEXA SCAN  Completed  . PNA vac Low Risk Adult  Completed  Cancer Screenings: Lung: Low Dose CT Chest recommended if Age 86-80 years, 30 pack-year currently smoking OR have quit w/in 15years. Patient does not qualify. Breast:  Up to date on Mammogram? No   Up to date of Bone Density/Dexa? Yes Colorectal:Declined    Additional Screenings:  Hepatitis C Screening: Declined     Plan:  Declined  colonoscopy  Declined TDap vaccine   I have personally reviewed and noted the following in the patient's chart:   . Medical and social history . Use of alcohol, tobacco or illicit drugs  . Current medications and supplements . Functional ability and status . Nutritional status . Physical activity . Advanced directives . List of other physicians . Hospitalizations, surgeries, and ER visits in previous 12 months . Vitals . Screenings to include cognitive, depression, and falls . Referrals and appointments  In addition, I have reviewed and discussed with patient certain preventive protocols, quality metrics, and best practice recommendations. A written personalized care plan for preventive services as well as general preventive health recommendations were provided to patient.   Sandrea Hughs, NP  02/07/2019

## 2019-02-07 NOTE — Patient Instructions (Addendum)
Ms. Ferrentino , Thank you for taking time to come for your Medicare Wellness Visit. I appreciate your ongoing commitment to your health goals. Please review the following plan we discussed and let me know if I can assist you in the future.   Screening recommendations/referrals: Colonoscopy: Declined  Mammogram: N/A  Bone Density: Up to date  Recommended yearly ophthalmology/optometry visit for glaucoma screening and checkup Recommended yearly dental visit for hygiene and checkup  Vaccinations: Influenza vaccine: Up to date  Pneumococcal vaccine: Up to date  Tdap vaccine: Every 10 years  Shingles vaccine: Declined    Advanced directives: will provide copy   Conditions/risks identified:Type 2 DM,HTN,Hypelipidemia,Age > 65 yrs   Next appointment: 1 year   Preventive Care 59 Years and Older, Female Preventive care refers to lifestyle choices and visits with your health care provider that can promote health and wellness. What does preventive care include?  A yearly physical exam. This is also called an annual well check.  Dental exams once or twice a year.  Routine eye exams. Ask your health care provider how often you should have your eyes checked.  Personal lifestyle choices, including:  Daily care of your teeth and gums.  Regular physical activity.  Eating a healthy diet.  Avoiding tobacco and drug use.  Limiting alcohol use.  Practicing safe sex.  Taking low-dose aspirin every day.  Taking vitamin and mineral supplements as recommended by your health care provider. What happens during an annual well check? The services and screenings done by your health care provider during your annual well check will depend on your age, overall health, lifestyle risk factors, and family history of disease. Counseling  Your health care provider may ask you questions about your:  Alcohol use.  Tobacco use.  Drug use.  Emotional well-being.  Home and relationship  well-being.  Sexual activity.  Eating habits.  History of falls.  Memory and ability to understand (cognition).  Work and work Statistician.  Reproductive health. Screening  You may have the following tests or measurements:  Height, weight, and BMI.  Blood pressure.  Lipid and cholesterol levels. These may be checked every 5 years, or more frequently if you are over 44 years old.  Skin check.  Lung cancer screening. You may have this screening every year starting at age 4 if you have a 30-pack-year history of smoking and currently smoke or have quit within the past 15 years.  Fecal occult blood test (FOBT) of the stool. You may have this test every year starting at age 24.  Flexible sigmoidoscopy or colonoscopy. You may have a sigmoidoscopy every 5 years or a colonoscopy every 10 years starting at age 71.  Hepatitis C blood test.  Hepatitis B blood test.  Sexually transmitted disease (STD) testing.  Diabetes screening. This is done by checking your blood sugar (glucose) after you have not eaten for a while (fasting). You may have this done every 1-3 years.  Bone density scan. This is done to screen for osteoporosis. You may have this done starting at age 65.  Mammogram. This may be done every 1-2 years. Talk to your health care provider about how often you should have regular mammograms. Talk with your health care provider about your test results, treatment options, and if necessary, the need for more tests. Vaccines  Your health care provider may recommend certain vaccines, such as:  Influenza vaccine. This is recommended every year.  Tetanus, diphtheria, and acellular pertussis (Tdap, Td) vaccine. You may need  a Td booster every 10 years.  Zoster vaccine. You may need this after age 37.  Pneumococcal 13-valent conjugate (PCV13) vaccine. One dose is recommended after age 79.  Pneumococcal polysaccharide (PPSV23) vaccine. One dose is recommended after age  33. Talk to your health care provider about which screenings and vaccines you need and how often you need them. This information is not intended to replace advice given to you by your health care provider. Make sure you discuss any questions you have with your health care provider. Document Released: 01/09/2016 Document Revised: 09/01/2016 Document Reviewed: 10/14/2015 Elsevier Interactive Patient Education  2017 [place] Prevention in the Home Falls can cause injuries. They can happen to people of all ages. There are many things you can do to make your home safe and to help prevent falls. What can I do on the outside of my home?  Regularly fix the edges of walkways and driveways and fix any cracks.  Remove anything that might make you trip as you walk through a door, such as a raised step or threshold.  Trim any bushes or trees on the path to your home.  Use bright outdoor lighting.  Clear any walking paths of anything that might make someone trip, such as rocks or tools.  Regularly check to see if handrails are loose or broken. Make sure that both sides of any steps have handrails.  Any raised decks and porches should have guardrails on the edges.  Have any leaves, snow, or ice cleared regularly.  Use sand or salt on walking paths during winter.  Clean up any spills in your garage right away. This includes oil or grease spills. What can I do in the bathroom?  Use night lights.  Install grab bars by the toilet and in the tub and shower. Do not use towel bars as grab bars.  Use non-skid mats or decals in the tub or shower.  If you need to sit down in the shower, use a plastic, non-slip stool.  Keep the floor dry. Clean up any water that spills on the floor as soon as it happens.  Remove soap buildup in the tub or shower regularly.  Attach bath mats securely with double-sided non-slip rug tape.  Do not have throw rugs and other things on the floor that can make  you trip. What can I do in the bedroom?  Use night lights.  Make sure that you have a light by your bed that is easy to reach.  Do not use any sheets or blankets that are too big for your bed. They should not hang down onto the floor.  Have a firm chair that has side arms. You can use this for support while you get dressed.  Do not have throw rugs and other things on the floor that can make you trip. What can I do in the kitchen?  Clean up any spills right away.  Avoid walking on wet floors.  Keep items that you use a lot in easy-to-reach places.  If you need to reach something above you, use a strong step stool that has a grab bar.  Keep electrical cords out of the way.  Do not use floor polish or wax that makes floors slippery. If you must use wax, use non-skid floor wax.  Do not have throw rugs and other things on the floor that can make you trip. What can I do with my stairs?  Do not leave any items on the  stairs.  Make sure that there are handrails on both sides of the stairs and use them. Fix handrails that are broken or loose. Make sure that handrails are as long as the stairways.  Check any carpeting to make sure that it is firmly attached to the stairs. Fix any carpet that is loose or worn.  Avoid having throw rugs at the top or bottom of the stairs. If you do have throw rugs, attach them to the floor with carpet tape.  Make sure that you have a light switch at the top of the stairs and the bottom of the stairs. If you do not have them, ask someone to add them for you. What else can I do to help prevent falls?  Wear shoes that:  Do not have high heels.  Have rubber bottoms.  Are comfortable and fit you well.  Are closed at the toe. Do not wear sandals.  If you use a stepladder:  Make sure that it is fully opened. Do not climb a closed stepladder.  Make sure that both sides of the stepladder are locked into place.  Ask someone to hold it for you, if  possible.  Clearly mark and make sure that you can see:  Any grab bars or handrails.  First and last steps.  Where the edge of each step is.  Use tools that help you move around (mobility aids) if they are needed. These include:  Canes.  Walkers.  Scooters.  Crutches.  Turn on the lights when you go into a dark area. Replace any light bulbs as soon as they burn out.  Set up your furniture so you have a clear path. Avoid moving your furniture around.  If any of your floors are uneven, fix them.  If there are any pets around you, be aware of where they are.  Review your medicines with your doctor. Some medicines can make you feel dizzy. This can increase your chance of falling. Ask your doctor what other things that you can do to help prevent falls. This information is not intended to replace advice given to you by your health care provider. Make sure you discuss any questions you have with your health care provider. Document Released: 10/09/2009 Document Revised: 05/20/2016 Document Reviewed: 01/17/2015 Elsevier Interactive Patient Education  2017 [employer].

## 2019-02-08 ENCOUNTER — Encounter: Payer: Self-pay | Admitting: *Deleted

## 2019-02-08 LAB — CBC WITH DIFFERENTIAL/PLATELET
Absolute Monocytes: 666 cells/uL (ref 200–950)
Basophils Absolute: 59 cells/uL (ref 0–200)
Basophils Relative: 0.8 %
Eosinophils Absolute: 259 cells/uL (ref 15–500)
Eosinophils Relative: 3.5 %
HCT: 31.7 % — ABNORMAL LOW (ref 35.0–45.0)
Hemoglobin: 10.8 g/dL — ABNORMAL LOW (ref 11.7–15.5)
Lymphs Abs: 1776 cells/uL (ref 850–3900)
MCH: 31.5 pg (ref 27.0–33.0)
MCHC: 34.1 g/dL (ref 32.0–36.0)
MCV: 92.4 fL (ref 80.0–100.0)
MPV: 10.8 fL (ref 7.5–12.5)
Monocytes Relative: 9 %
Neutro Abs: 4640 cells/uL (ref 1500–7800)
Neutrophils Relative %: 62.7 %
Platelets: 230 10*3/uL (ref 140–400)
RBC: 3.43 10*6/uL — ABNORMAL LOW (ref 3.80–5.10)
RDW: 12.5 % (ref 11.0–15.0)
Total Lymphocyte: 24 %
WBC: 7.4 10*3/uL (ref 3.8–10.8)

## 2019-02-08 LAB — LIPID PANEL
Cholesterol: 121 mg/dL (ref <200)
HDL: 48 mg/dL — ABNORMAL LOW (ref >=50)
LDL Cholesterol (Calc): 60 mg/dL (calc)
Non-HDL Cholesterol (Calc): 73 mg/dL (calc) (ref <130)
Total CHOL/HDL Ratio: 2.5 (calc) (ref <5.0)
Triglycerides: 53 mg/dL (ref <150)

## 2019-02-08 LAB — HEMOGLOBIN A1C
Hgb A1c MFr Bld: 8.9 % of total Hgb — ABNORMAL HIGH (ref <5.7)
Mean Plasma Glucose: 209 (calc)
eAG (mmol/L): 11.6 (calc)

## 2019-02-08 LAB — COMPREHENSIVE METABOLIC PANEL
AG Ratio: 1.6 (calc) (ref 1.0–2.5)
ALT: 9 U/L (ref 6–29)
AST: 13 U/L (ref 10–35)
Albumin: 3.9 g/dL (ref 3.6–5.1)
Alkaline phosphatase (APISO): 71 U/L (ref 37–153)
BUN/Creatinine Ratio: 20 (calc) (ref 6–22)
BUN: 31 mg/dL — ABNORMAL HIGH (ref 7–25)
CO2: 22 mmol/L (ref 20–32)
Calcium: 9.7 mg/dL (ref 8.6–10.4)
Chloride: 108 mmol/L (ref 98–110)
Creat: 1.54 mg/dL — ABNORMAL HIGH (ref 0.60–0.88)
Globulin: 2.5 g/dL (calc) (ref 1.9–3.7)
Glucose, Bld: 183 mg/dL — ABNORMAL HIGH (ref 65–99)
Potassium: 4 mmol/L (ref 3.5–5.3)
Sodium: 137 mmol/L (ref 135–146)
Total Bilirubin: 0.4 mg/dL (ref 0.2–1.2)
Total Protein: 6.4 g/dL (ref 6.1–8.1)

## 2019-02-15 ENCOUNTER — Encounter: Payer: Self-pay | Admitting: Internal Medicine

## 2019-02-15 ENCOUNTER — Ambulatory Visit (INDEPENDENT_AMBULATORY_CARE_PROVIDER_SITE_OTHER): Payer: Medicare Other | Admitting: Internal Medicine

## 2019-02-15 VITALS — BP 128/60 | HR 68 | Temp 98.3°F | Ht 65.0 in | Wt 147.0 lb

## 2019-02-15 DIAGNOSIS — I10 Essential (primary) hypertension: Secondary | ICD-10-CM

## 2019-02-15 DIAGNOSIS — E1129 Type 2 diabetes mellitus with other diabetic kidney complication: Secondary | ICD-10-CM | POA: Diagnosis not present

## 2019-02-15 DIAGNOSIS — E1169 Type 2 diabetes mellitus with other specified complication: Secondary | ICD-10-CM | POA: Diagnosis not present

## 2019-02-15 DIAGNOSIS — E1165 Type 2 diabetes mellitus with hyperglycemia: Secondary | ICD-10-CM

## 2019-02-15 DIAGNOSIS — E113553 Type 2 diabetes mellitus with stable proliferative diabetic retinopathy, bilateral: Secondary | ICD-10-CM

## 2019-02-15 DIAGNOSIS — I739 Peripheral vascular disease, unspecified: Secondary | ICD-10-CM

## 2019-02-15 DIAGNOSIS — E785 Hyperlipidemia, unspecified: Secondary | ICD-10-CM

## 2019-02-15 DIAGNOSIS — IMO0002 Reserved for concepts with insufficient information to code with codable children: Secondary | ICD-10-CM

## 2019-02-15 NOTE — Patient Instructions (Addendum)
Please bring your bp cuff next time so we can calibrate it.   Bring your glucometer with you. You will hear from [place] about a podiatry appointment, as well as an eye appt with Dr. Gershon Crane.

## 2019-02-15 NOTE — Progress Notes (Signed)
Location:  [facility] clinic Provider:  Benjiman Sedgwick L. Mariea Clonts, D.O., C.M.D.  Code Status: DNR Goals of Care:  Advanced Directives 02/07/2019  Does Patient Have a Medical Advance Directive? Yes  Type of Advance Directive Out of facility DNR (pink MOST or yellow form)  Does patient want to make changes to medical advance directive? No - Patient declined  Would patient like information on creating a medical advance directive? -  Pre-existing out of facility DNR order (yellow form or pink MOST form) Yellow form placed in chart (order not valid for inpatient use)     Chief Complaint  Patient presents with  . Medical Management of Chronic Issues    104mth follow-up    HPI: Patient is a 84 y.o. female seen today for medical management of chronic diseases.    Kidney function looked better this time with cr 1.54 down from 1.75.  She's a bit more anemic.    LDL at goal.   hba1c has crept up a bit again.  Goal less than 8.5 and now 8.9. Glucometer--forgot--sometimes up, but not as much as they used to.  Is eating better and taking her medicine as directed better.  Has lost a couple lbs.  She is back to an exercise program again.  She is getting out all days except one now so she's less lonely.   That makes her feel better.   Will feel shaky if less than 100 and eats the glucose tablets.  Sometimes has two instead of one.  Has even used the gel when it's low.    Blood pressure is great today.  Was going to bring home cuff along.    Asks about having gas coming from her vagina a couple of times.  No discharge or stool in vagina or changes down there otherwise.  Eyes:  Says they are doing terrible.  Has to read with a magnifying glass.  Ok otherwise.  Is due for her ophtho appt.  Dr. Gershon Crane.  No changes in her leg pain.  Has her claudication. Has to stop and rest when walking.  Especially if she's on cement.  Fine if there are wood floors.  No sores on her feet.  Had been going to podiatry for toenails to  be trimmed.    Has new kidney doctor.  She talked with her more and educated her better.  Sees her again in a year.    Past Medical History:  Diagnosis Date  . Acute upper respiratory infections of unspecified site   . Anemia   . Anemia, unspecified   . Atherosclerosis of native arteries of the extremities, unspecified   . Chest pain, unspecified   . Chronic kidney disease (CKD), stage II (mild)   . Diabetes mellitus   . Diarrhea   . Disorder of bone and cartilage, unspecified   . DM (diabetes mellitus) type II controlled with renal manifestation ([place])   . Herpes zoster with other nervous system complications(053.19)   . Hypercalcemia   . Hypertension   . Hypertensive renal disease, benign   . Nonspecific reaction to tuberculin skin test without active tuberculosis(795.51)   . Nonspecific tuberculin test reaction   . Other and unspecified hyperlipidemia   . Pain in joint, lower leg   . Peripheral arterial disease ([place])   . Postherpetic neuralgia   . Proteinuria   . Stroke ([place]) 01/2017  . Type II or unspecified type diabetes mellitus with renal manifestations, not stated as uncontrolled(250.40)   . Type II  or unspecified type diabetes mellitus with renal manifestations, uncontrolled(250.42)   . Unspecified disorder of kidney and ureter   . Unspecified essential hypertension     Past Surgical History:  Procedure Laterality Date  . hysterectomy    . INCISION AND DRAINAGE Left 05/27/14   sebacous cyst, ear  . PRP Left    Dr. Ricki Miller  . removal of cyst from hand    . removal of tumor from foot    . TONSILLECTOMY      Allergies  Allergen Reactions  . Invokana [Canagliflozin] Other (See Comments)    Vagina itching and swelling Vaginal Itching and irritation   . Jardiance [Empagliflozin] Itching and Other (See Comments)    Vaginal itching and swelling    Outpatient Encounter Medications as of 02/15/2019  Medication Sig  . amLODipine (NORVASC) 10 MG tablet TAKE 1  TABLET (10 MG TOTAL) BY MOUTH DAILY. FOR HIGH BLOOD PRESSURE  . atorvastatin (LIPITOR) 20 MG tablet Take 20 mg by mouth daily.  . carvedilol (COREG) 25 MG tablet TAKE 1 TABLET BY MOUTH TWICE A DAY WITH A MEAL  . cloNIDine (CATAPRES) 0.1 MG tablet Take 1 tablet (0.1 mg total) by mouth at bedtime.  . clopidogrel (PLAVIX) 75 MG tablet TAKE ONE TABLET BY MOUTH ONCE DAILY  . Insulin Glargine (LANTUS) 100 UNIT/ML Solostar Pen INJECT 50 UNITS UNDER THE SKIN DAILY FOR DIABETES  . insulin lispro (HUMALOG KWIKPEN) 100 UNIT/ML KiwkPen INJECT 6 UNITS BREAKFAST, 6 UNITS LUNCH & SUPPER  . Insulin Pen Needle (B-D ULTRAFINE III SHORT PEN) 31G X 8 MM MISC Use to check blood sugar every day. Dx: 11.29; 11.65  . ONETOUCH VERIO test strip USE TO TEST BLOOD SUGAR THREE TIMES DAILY. DX: E11.9  . valsartan-hydrochlorothiazide (DIOVAN-HCT) 320-25 MG tablet TAKE 1 TABLET BY MOUTH EVERY DAY   No facility-administered encounter medications on file as of 02/15/2019.     Review of Systems:  Review of Systems  Constitutional: Negative for chills, fever and malaise/fatigue.  HENT: Positive for hearing loss.   Eyes: Negative for blurred vision.  Respiratory: Negative for cough and shortness of breath.   Cardiovascular: Negative for chest pain, palpitations and leg swelling.  Gastrointestinal: Negative for abdominal pain, blood in stool, constipation, diarrhea and melena.  Genitourinary: Negative for dysuria.  Musculoskeletal: Positive for myalgias. Negative for falls and joint pain.  Skin: Negative for itching and rash.  Neurological: Positive for tingling and sensory change. Negative for loss of consciousness and headaches.  Endo/Heme/Allergies: Does not bruise/bleed easily.  Psychiatric/Behavioral: Positive for memory loss. Negative for depression. The patient is not nervous/anxious and does not have insomnia.     Health Maintenance  Topic Date Due  . OPHTHALMOLOGY EXAM  01/26/2019  . FOOT EXAM  02/06/2019  .  HEMOGLOBIN A1C  08/08/2019  . INFLUENZA VACCINE  Completed  . DEXA SCAN  Completed  . PNA vac Low Risk Adult  Completed    Physical Exam: Vitals:   02/15/19 0947  BP: 128/60  Pulse: 68  Temp: 98.3 F (36.8 C)  TempSrc: Oral  SpO2: 95%  Weight: 147 lb (66.7 kg)  Height: 5\' 5"  (1.651 m)   Body mass index is 24.46 kg/m. Physical Exam Vitals signs reviewed.  Constitutional:      Appearance: Normal appearance.  HENT:     Head: Normocephalic and atraumatic.  Eyes:     Comments: glasses  Cardiovascular:     Rate and Rhythm: Normal rate and regular rhythm.  Pulses: Normal pulses.     Heart sounds: Normal heart sounds.  Pulmonary:     Effort: Pulmonary effort is normal.     Breath sounds: Normal breath sounds.  Abdominal:     General: Bowel sounds are normal.     Palpations: Abdomen is soft.  Musculoskeletal: Normal range of motion.  Skin:    General: Skin is warm and dry.     Capillary Refill: Capillary refill takes less than 2 seconds.  Neurological:     General: No focal deficit present.     Mental Status: She is alert and oriented to person, place, and time.     Comments: Some short term memory loss--clearer today  Psychiatric:        Mood and Affect: Mood normal.     Labs reviewed: Basic Metabolic Panel: Recent Labs    07/24/18 0823 10/25/18 0801 02/07/19 0900  NA 137 137 137  K 4.0 4.0 4.0  CL 105 101 108  CO2 24 26 22   GLUCOSE 166* 219* 183*  BUN 35* 27* 31*  CREATININE 1.50* 1.75* 1.54*  CALCIUM 10.1 9.9 9.7   Liver Function Tests: Recent Labs    07/24/18 0823 10/25/18 0801 02/07/19 0900  AST 15 15 13   ALT 15 11 9   BILITOT 0.5 0.6 0.4  PROT 6.9 6.5 6.4   No results for input(s): LIPASE, AMYLASE in the last 8760 hours. No results for input(s): AMMONIA in the last 8760 hours. CBC: Recent Labs    10/25/18 0801 02/07/19 0900  WBC 8.4 7.4  NEUTROABS 5,746 4,640  HGB 11.2* 10.8*  HCT 32.5* 31.7*  MCV 90.5 92.4  PLT 232 230    Lipid Panel: Recent Labs    10/25/18 0801 02/07/19 0900  CHOL 123 121  HDL 49* 48*  LDLCALC 58 60  TRIG 81 53  CHOLHDL 2.5 2.5   Lab Results  Component Value Date   HGBA1C 8.9 (H) 02/07/2019    Procedures since last visit: No results found.  Assessment/Plan 1. Type II diabetes mellitus with renal manifestations, uncontrolled (HCC) - cont current insulin regimen - counseled again about eating enough before going out - Ambulatory referral to Podiatry - CBC with Differential/Platelet; Future - COMPLETE METABOLIC PANEL WITH GFR; Future - Hemoglobin A1c; Future  2. PAD (peripheral artery disease) ([place]) - continues with intermittent claudication - Ambulatory referral to Podiatry  3. Stable proliferative diabetic retinopathy of both eyes associated with type 2 diabetes mellitus ([place]) - f/u with Dr. Gershon Crane - Ambulatory referral to Ophthalmology  4. Hyperlipidemia associated with type 2 diabetes mellitus ([place]) -cont lipitor therapy and exercise regimen she's resumed   5. Essential hypertension, benign -bp at goal today  Labs/tests ordered:   Orders Placed This Encounter  Procedures  . CBC with Differential/Platelet    Standing Status:   Future    Standing Expiration Date:   02/16/2020  . COMPLETE METABOLIC PANEL WITH GFR    Standing Status:   Future    Standing Expiration Date:   02/16/2020  . Hemoglobin A1c    Standing Status:   Future    Standing Expiration Date:   02/16/2020  . Ambulatory referral to Podiatry    Referral Priority:   Routine    Referral Type:   Consultation    Referral Reason:   Specialty Services Required    Requested Specialty:   Podiatry    Number of Visits Requested:   1  . Ambulatory referral to Ophthalmology    Referral  Priority:   Routine    Referral Type:   Consultation    Referral Reason:   Specialty Services Required    Requested Specialty:   Ophthalmology    Number of Visits Requested:   1   Next appt:  06/28/2019  Jasmeet Manton L.  Priyana Mccarey, D.O. [facility] [address], [place] [address] Cell Phone (Mon-Fri 8am-5pm):  [phone] On Call:  [phone] & follow prompts after 5pm & weekends Office Phone:  [phone] Office Fax:  [phone]

## 2019-02-19 ENCOUNTER — Encounter: Payer: Self-pay | Admitting: Internal Medicine

## 2019-02-19 ENCOUNTER — Other Ambulatory Visit: Payer: Self-pay

## 2019-02-19 NOTE — Patient Outreach (Signed)
[facility]) Care Management  [place]  02/19/2019   Monique Cabrera Jul 20, 1935 [id number]   [place] Every Other Month Outreach   Outreach Attempt:  Successful telephone outreach to patient for follow up.  HIPAA verified with patient.  Patient stating she is doing well.  Is a lot more involved with senior activities in the community and exercise programs.  Being more involved is making her feel more fulfilled.  Reports this mornings fasting blood sugar was 185 with fasting ranges of 100-225.  States she starts to feel symptomatic with hypoglycemia at rages of 100's and reports these episodes about a couple of times a week.  At these times patient treats her hypoglycemia to prevent her blood sugar from decreasing even more.  Last Hgb A1C was increased to 8.9 on 02/07/2019.  Encounter Medications:  Outpatient Encounter Medications as of 02/19/2019  Medication Sig  . amLODipine (NORVASC) 10 MG tablet TAKE 1 TABLET (10 MG TOTAL) BY MOUTH DAILY. FOR HIGH BLOOD PRESSURE  . atorvastatin (LIPITOR) 20 MG tablet Take 20 mg by mouth daily.  . carvedilol (COREG) 25 MG tablet TAKE 1 TABLET BY MOUTH TWICE A DAY WITH A MEAL  . cloNIDine (CATAPRES) 0.1 MG tablet Take 1 tablet (0.1 mg total) by mouth at bedtime.  . clopidogrel (PLAVIX) 75 MG tablet TAKE ONE TABLET BY MOUTH ONCE DAILY  . Insulin Glargine (LANTUS) 100 UNIT/ML Solostar Pen INJECT 50 UNITS UNDER THE SKIN DAILY FOR DIABETES  . insulin lispro (HUMALOG KWIKPEN) 100 UNIT/ML KiwkPen INJECT 6 UNITS BREAKFAST, 6 UNITS LUNCH & SUPPER  . Insulin Pen Needle (B-D ULTRAFINE III SHORT PEN) 31G X 8 MM MISC Use to check blood sugar every day. Dx: 11.29; 11.65  . ONETOUCH VERIO test strip USE TO TEST BLOOD SUGAR THREE TIMES DAILY. DX: E11.9  . valsartan-hydrochlorothiazide (DIOVAN-HCT) 320-25 MG tablet TAKE 1 TABLET BY MOUTH EVERY DAY   No facility-administered encounter medications on file as of 02/19/2019.     Functional  Status:  In your present state of health, do you have any difficulty performing the following activities: 02/07/2019  Hearing? N  Vision? Y  Comment follows up with Opthalmology   Difficulty concentrating or making decisions? Y  Comment forgetful sometimes   Walking or climbing stairs? N  Dressing or bathing? N  Doing errands, shopping? N  Preparing Food and eating ? N  Using the Toilet? N  In the past six months, have you accidently leaked urine? N  Do you have problems with loss of bowel control? N  Managing your Medications? N  Managing your Finances? N  Housekeeping or managing your Housekeeping? N  Some recent data might be hidden    Fall/Depression Screening: Fall Risk  02/19/2019 02/15/2019 02/07/2019  Falls in the past year? 0 0 0  Number falls in past yr: - 0 0  Comment - - -  Injury with Fall? - 0 0  Risk for fall due to : Impaired balance/gait;Medication side effect - -  Follow up Falls evaluation completed;Falls prevention discussed;Education provided - -   PHQ 2/9 Scores 02/15/2019 02/07/2019 10/12/2018 06/05/2018 02/06/2018 10/14/2017 10/06/2017  PHQ - 2 Score 0 0 0 0 0 2 0  PHQ- 9 Score - - - - - 3 -    THN CM Care Plan Problem One     Most Recent Value  Care Plan Problem One  Knowledge deficiet related to self care management to reduce Hgb A1C  Role Documenting  the Problem One  [place] for Problem One  Active  THN Long Term Goal   In 60 days the patient will decrease her a1c of 8.7 down by 0.2 points  [facility] Long Term Goal Start Date  02/19/19  Interventions for Problem One Long Term Goal  Discussed with patient low carbohydate diet, discussed healthier meal and drink options, encouraged continued active involvement in community activities and exercises, reviewed medications and encouraged medication compliance, encouraged to keep and attend medical appointments, discussed current increase in A1C and ways to help reduce it to goal of 7, reviewed and  discussed hypoglycemia     Appointments:  Patient last seen Dr. Mariea Clonts on 02/15/2019 and has scheduled follow up on 06/21/2019. Patient encouraged to keep and attend medical appointments.  Plan: RN Health Coach will send primary care provider quarterly update. RN Health Coach will schedule another telephone outreach to patient on primary Health Coach Schedule within the month of April  [place] [phone] [email or web address]

## 2019-03-17 ENCOUNTER — Other Ambulatory Visit: Payer: Self-pay | Admitting: Cardiology

## 2019-03-30 ENCOUNTER — Ambulatory Visit: Payer: Medicare Other | Admitting: Podiatry

## 2019-04-22 ENCOUNTER — Encounter: Payer: Self-pay | Admitting: Internal Medicine

## 2019-04-23 ENCOUNTER — Other Ambulatory Visit: Payer: Self-pay

## 2019-04-23 ENCOUNTER — Telehealth: Payer: Self-pay | Admitting: *Deleted

## 2019-04-23 NOTE — Patient Outreach (Signed)
[facility]) Care Management  04/23/2019   Monique Cabrera [birth year] [id number]  Subjective:Successful outreach to the patient.  HIPAA verified.  The patient states that she has felt the best she has in a while.  She denies any pain or falls.  She states that her blood sugars have been running in the 300's for the last three weeks.  After inquiring she states that she has been eating a lot of white potatoes in her diet cooked different ways daily.  I advised the patient that potatoes have a lot of carbohydrates and can keep her sugar elevated. I advised her to call her physician office and let them know that her blood sugars have been elevated. She verbalized understanding.  She states that she is taking her medications as prescribed.  She states that she has not been exercising as before since she is not going to the gym.  The patient states that she is walking a small amount where she lives. The patient states that she has an appointment next month to see the podiatrist.  Current Medications:  Current Outpatient Medications  Medication Sig Dispense Refill  . amLODipine (NORVASC) 10 MG tablet TAKE 1 TABLET (10 MG TOTAL) BY MOUTH DAILY. FOR HIGH BLOOD PRESSURE 90 tablet 1  . atorvastatin (LIPITOR) 20 MG tablet TAKE 1 TABLET BY MOUTH EVERY DAY 90 tablet 1  . carvedilol (COREG) 25 MG tablet TAKE 1 TABLET BY MOUTH TWICE A DAY WITH A MEAL 180 tablet 1  . cloNIDine (CATAPRES) 0.1 MG tablet Take 1 tablet (0.1 mg total) by mouth at bedtime. 90 tablet 1  . clopidogrel (PLAVIX) 75 MG tablet TAKE ONE TABLET BY MOUTH ONCE DAILY 90 tablet 1  . Insulin Glargine (LANTUS) 100 UNIT/ML Solostar Pen INJECT 50 UNITS UNDER THE SKIN DAILY FOR DIABETES 45 pen 1  . insulin lispro (HUMALOG KWIKPEN) 100 UNIT/ML KiwkPen INJECT 6 UNITS BREAKFAST, 6 UNITS LUNCH & SUPPER 15 pen 1  . Insulin Pen Needle (B-D ULTRAFINE III SHORT PEN) 31G X 8 MM MISC Use to check blood sugar every day. Dx: 11.29; 11.65 100 each  1  . ONETOUCH VERIO test strip USE TO TEST BLOOD SUGAR THREE TIMES DAILY. DX: E11.9 300 each 3  . valsartan-hydrochlorothiazide (DIOVAN-HCT) 320-25 MG tablet TAKE 1 TABLET BY MOUTH EVERY DAY 90 tablet 1   No current facility-administered medications for this visit.     Functional Status:  In your present state of health, do you have any difficulty performing the following activities: 02/07/2019  Hearing? N  Vision? Y  Comment follows up with Opthalmology   Difficulty concentrating or making decisions? Y  Comment forgetful sometimes   Walking or climbing stairs? N  Dressing or bathing? N  Doing errands, shopping? N  Preparing Food and eating ? N  Using the Toilet? N  In the past six months, have you accidently leaked urine? N  Do you have problems with loss of bowel control? N  Managing your Medications? N  Managing your Finances? N  Housekeeping or managing your Housekeeping? N  Some recent data might be hidden    Fall/Depression Screening: Fall Risk  04/23/2019 02/19/2019 02/15/2019  Falls in the past year? 0 0 0  Number falls in past yr: - - 0  Comment - - -  Injury with Fall? - - 0  Risk for fall due to : - Impaired balance/gait;Medication side effect -  Follow up - Falls evaluation completed;Falls prevention discussed;Education provided -  PHQ 2/9 Scores 02/15/2019 02/07/2019 10/12/2018 06/05/2018 02/06/2018 10/14/2017 10/06/2017  PHQ - 2 Score 0 0 0 0 0 2 0  PHQ- 9 Score - - - - - 3 -    Assessment: Patient will continue to benefit from health coach outreach for disease management and support. THN CM Care Plan Problem One     Most Recent Value  THN Long Term Goal   In 90 days the patient will decrease her a1c of 8.9 down by 0.2 points  THN Long Term Goal Start Date  04/23/19  Interventions for Problem One Long Term Goal  Discussed with the patient about heathier food choices,  Reviewed her FBS, encouraged the patient to exercise in the home, Encouraged meedication  adherence and discussed covid-19 precautionary measures.       Plan: RN Health Coach will contact patient in the month of July and patient agrees to next outreach.   Lazaro Arms RN, BSN, [place] Direct Dial:  [phone]  Fax: [phone]

## 2019-04-23 NOTE — Telephone Encounter (Signed)
Pt calling stating that her blood sugar has been running high around 300 in the morning but not every morning, pt takes lantus and humalog in the morning. She states she takes them a hour apart, I advised that maybe the lantus should be at bedtime and pt states " I TOLD YOU DR REED KNOW I TAKE IT IN THE MORNING" please advise

## 2019-04-24 NOTE — Telephone Encounter (Signed)
Patient called back. Message given and patent agreed.

## 2019-04-24 NOTE — Telephone Encounter (Signed)
Is she eating regular meals?  Is she taking her insulin as directed?  I know she takes the lantus in the morning--she was getting awful lows when she took it at night.  She has historically fallen off the wagon with following regular eating habits.  Does she have someone getting her some groceries at this point?

## 2019-04-24 NOTE — Telephone Encounter (Signed)
Pt stated she has been eating a lot of white potatoes, and wanted to know if that has anything to do with it? She is taking her insulin as she should

## 2019-04-24 NOTE — Telephone Encounter (Signed)
Son states pt was not at home, will try again later

## 2019-04-24 NOTE — Telephone Encounter (Signed)
The white potatoes will definitely increase her sugar.  She needs to focus more on greens, veggies, and protein (chicken, fish) with only small portions of things like potatoes, pasta, rice or bread. Ask her to look for the handout I've given her with the plate picture.

## 2019-05-02 ENCOUNTER — Ambulatory Visit: Payer: Medicare Other | Admitting: Podiatry

## 2019-06-10 ENCOUNTER — Other Ambulatory Visit: Payer: Self-pay | Admitting: Cardiology

## 2019-06-10 DIAGNOSIS — E7800 Pure hypercholesterolemia, unspecified: Secondary | ICD-10-CM

## 2019-06-13 ENCOUNTER — Other Ambulatory Visit: Payer: Self-pay | Admitting: Internal Medicine

## 2019-06-19 ENCOUNTER — Other Ambulatory Visit: Payer: Self-pay

## 2019-06-19 NOTE — Patient Outreach (Signed)
[facility]) Care Management  06/19/2019  SASHIA CAMPAS [birth year] [id number]   Medication Adherence call to Mrs. Weyman Croon [place] Identifiers Verify spoke with patient she is due on Valsartan/Hctz 320/25 mg patient explain she takes her medication on a regular  basis and never missed a dose patient said she always picks up on time and has plenty of medication at this time. Mrs. Mandrell is showing past due under [place].   [place] Management Direct Dial [phone]  Fax [phone] [email or web address]

## 2019-06-21 ENCOUNTER — Other Ambulatory Visit: Payer: Medicare Other

## 2019-06-25 ENCOUNTER — Other Ambulatory Visit: Payer: Self-pay

## 2019-06-25 ENCOUNTER — Encounter: Payer: Self-pay | Admitting: Podiatry

## 2019-06-25 ENCOUNTER — Ambulatory Visit (INDEPENDENT_AMBULATORY_CARE_PROVIDER_SITE_OTHER): Payer: Medicare Other | Admitting: Podiatry

## 2019-06-25 VITALS — Temp 97.3°F

## 2019-06-25 DIAGNOSIS — B351 Tinea unguium: Secondary | ICD-10-CM

## 2019-06-25 DIAGNOSIS — E1151 Type 2 diabetes mellitus with diabetic peripheral angiopathy without gangrene: Secondary | ICD-10-CM | POA: Diagnosis not present

## 2019-06-25 DIAGNOSIS — R21 Rash and other nonspecific skin eruption: Secondary | ICD-10-CM | POA: Diagnosis not present

## 2019-06-25 MED ORDER — TRIAMCINOLONE ACETONIDE 0.5 % EX CREA: TOPICAL_CREAM | CUTANEOUS | 1 refills | Status: DC

## 2019-06-25 NOTE — Patient Instructions (Addendum)
Rash, Adult  A rash is a change in the color of your skin. A rash can also change the way your skin feels. There are many different conditions and factors that can cause a rash. Follow these instructions at home: The goal of treatment is to stop the itching and keep the rash from spreading. Watch for any changes in your symptoms. Let your doctor know about them. Follow these instructions to help with your condition: Medicine Take or apply over-the-counter and prescription medicines only as told by your doctor. These may include medicines:  To treat red or swollen skin (corticosteroid creams).  To treat itching.  To treat an allergy (oral antihistamines).  To treat very bad symptoms (oral corticosteroids).  Skin care  Put cool cloths (compresses) on the affected areas.  Do not scratch or rub your skin.  Avoid covering the rash. Make sure that the rash is exposed to air as much as possible. Managing itching and discomfort  Avoid hot showers or baths. These can make itching worse. A cold shower may help.  Try taking a bath with: ? Epsom salts. You can get these at your local pharmacy or grocery store. Follow the instructions on the package. ? Baking soda. Pour a small amount into the bath as told by your doctor. ? Colloidal oatmeal. You can get this at your local pharmacy or grocery store. Follow the instructions on the package.  Try putting baking soda paste onto your skin. Stir water into baking soda until it gets like a paste.  Try putting on a lotion that relieves itchiness (calamine lotion).  Keep cool and out of the sun. Sweating and being hot can make itching worse. General instructions   Rest as needed.  Drink enough fluid to keep your pee (urine) pale yellow.  Wear loose-fitting clothing.  Avoid scented soaps, detergents, and perfumes. Use gentle soaps, detergents, perfumes, and other cosmetic products.  Avoid anything that causes your rash. Keep a journal to  help track what causes your rash. Write down: ? What you eat. ? What cosmetic products you use. ? What you drink. ? What you wear. This includes jewelry.  Keep all follow-up visits as told by your doctor. This is important. Contact a doctor if:  You sweat at night.  You lose weight.  You pee (urinate) more than normal.  You pee less than normal, or you notice that your pee is a darker color than normal.  You feel weak.  You throw up (vomit).  Your skin or the whites of your eyes look yellow (jaundice).  Your skin: ? Tingles. ? Is numb.  Your rash: ? Does not go away after a few days. ? Gets worse.  You are: ? More thirsty than normal. ? More tired than normal.  You have: ? New symptoms. ? Pain in your belly (abdomen). ? A fever. ? Watery poop (diarrhea). Get help right away if:  You have a fever and your symptoms suddenly get worse.  You start to feel mixed up (confused).  You have a very bad headache or a stiff neck.  You have very bad joint pains or stiffness.  You have jerky movements that you cannot control (seizure).  Your rash covers all or most of your body. The rash may or may not be painful.  You have blisters that: ? Are on top of the rash. ? Grow larger. ? Grow together. ? Are painful. ? Are inside your nose or mouth.  You have a rash   that: ? Looks like purple pinprick-sized spots all over your body. ? Has a "bull's eye" or looks like a target. ? Is red and painful, causes your skin to peel, and is not from being in the sun too long. Summary  A rash is a change in the color of your skin. A rash can also change the way your skin feels.  The goal of treatment is to stop the itching and keep the rash from spreading.  Take or apply over-the-counter and prescription medicines only as told by your doctor.  Contact a doctor if you have new symptoms or symptoms that get worse.  Keep all follow-up visits as told by your doctor. This is  important. This information is not intended to replace advice given to you by your health care provider. Make sure you discuss any questions you have with your health care provider. Document Released: 05/31/2008 Document Revised: 04/06/2019 Document Reviewed: 07/17/2018 Elsevier Patient Education  [place].  Diabetes Mellitus and [place] care is an important part of your health, especially when you have diabetes. Diabetes may cause you to have problems because of poor blood flow (circulation) to your feet and legs, which can cause your skin to:  Become thinner and drier.  Break more easily.  Heal more slowly.  Peel and crack. You may also have nerve damage (neuropathy) in your legs and feet, causing decreased feeling in them. This means that you may not notice minor injuries to your feet that could lead to more serious problems. Noticing and addressing any potential problems early is the best way to prevent future foot problems. How to care for your feet Foot hygiene  Wash your feet daily with warm water and mild soap. Do not use hot water. Then, pat your feet and the areas between your toes until they are completely dry. Do not soak your feet as this can dry your skin.  Trim your toenails straight across. Do not dig under them or around the cuticle. File the edges of your nails with an emery board or nail file.  Apply a moisturizing lotion or petroleum jelly to the skin on your feet and to dry, brittle toenails. Use lotion that does not contain alcohol and is unscented. Do not apply lotion between your toes. Shoes and socks  Wear clean socks or stockings every day. Make sure they are not too tight. Do not wear knee-high stockings since they may decrease blood flow to your legs.  Wear shoes that fit properly and have enough cushioning. Always look in your shoes before you put them on to be sure there are no objects inside.  To break in new shoes, wear them for just a  few hours a day. This prevents injuries on your feet. Wounds, scrapes, corns, and calluses  Check your feet daily for blisters, cuts, bruises, sores, and redness. If you cannot see the bottom of your feet, use a mirror or ask someone for help.  Do not cut corns or calluses or try to remove them with medicine.  If you find a minor scrape, cut, or break in the skin on your feet, keep it and the skin around it clean and dry. You may clean these areas with mild soap and water. Do not clean the area with peroxide, alcohol, or iodine.  If you have a wound, scrape, corn, or callus on your foot, look at it several times a day to make sure it is healing and not infected. Check  for: ? Redness, swelling, or pain. ? Fluid or blood. ? Warmth. ? Pus or a bad smell. General instructions  Do not cross your legs. This may decrease blood flow to your feet.  Do not use heating pads or hot water bottles on your feet. They may burn your skin. If you have lost feeling in your feet or legs, you may not know this is happening until it is too late.  Protect your feet from hot and cold by wearing shoes, such as at the beach or on hot pavement.  Schedule a complete foot exam at least once a year (annually) or more often if you have foot problems. If you have foot problems, report any cuts, sores, or bruises to your health care provider immediately. Contact a health care provider if:  You have a medical condition that increases your risk of infection and you have any cuts, sores, or bruises on your feet.  You have an injury that is not healing.  You have redness on your legs or feet.  You feel burning or tingling in your legs or feet.  You have pain or cramps in your legs and feet.  Your legs or feet are numb.  Your feet always feel cold.  You have pain around a toenail. Get help right away if:  You have a wound, scrape, corn, or callus on your foot and: ? You have pain, swelling, or redness that gets  worse. ? You have fluid or blood coming from the wound, scrape, corn, or callus. ? Your wound, scrape, corn, or callus feels warm to the touch. ? You have pus or a bad smell coming from the wound, scrape, corn, or callus. ? You have a fever. ? You have a red line going up your leg. Summary  Check your feet every day for cuts, sores, red spots, swelling, and blisters.  Moisturize feet and legs daily.  Wear shoes that fit properly and have enough cushioning.  If you have foot problems, report any cuts, sores, or bruises to your health care provider immediately.  Schedule a complete foot exam at least once a year (annually) or more often if you have foot problems. This information is not intended to replace advice given to you by your health care provider. Make sure you discuss any questions you have with your health care provider. Document Released: 12/10/2000 Document Revised: 01/25/2018 Document Reviewed: 01/14/2017 Elsevier Patient Education  2020 [employer].

## 2019-06-27 ENCOUNTER — Other Ambulatory Visit: Payer: Medicare Other

## 2019-06-27 ENCOUNTER — Other Ambulatory Visit: Payer: Self-pay

## 2019-06-27 DIAGNOSIS — E1129 Type 2 diabetes mellitus with other diabetic kidney complication: Secondary | ICD-10-CM

## 2019-06-27 DIAGNOSIS — IMO0002 Reserved for concepts with insufficient information to code with codable children: Secondary | ICD-10-CM

## 2019-06-27 DIAGNOSIS — E1165 Type 2 diabetes mellitus with hyperglycemia: Secondary | ICD-10-CM | POA: Diagnosis not present

## 2019-06-28 ENCOUNTER — Ambulatory Visit: Payer: Medicare Other | Admitting: Internal Medicine

## 2019-06-28 LAB — COMPLETE METABOLIC PANEL WITH GFR
AG Ratio: 1.5 (calc) (ref 1.0–2.5)
ALT: 20 U/L (ref 6–29)
AST: 20 U/L (ref 10–35)
Albumin: 3.9 g/dL (ref 3.6–5.1)
Alkaline phosphatase (APISO): 63 U/L (ref 37–153)
BUN/Creatinine Ratio: 23 (calc) — ABNORMAL HIGH (ref 6–22)
BUN: 36 mg/dL — ABNORMAL HIGH (ref 7–25)
CO2: 21 mmol/L (ref 20–32)
Calcium: 9.9 mg/dL (ref 8.6–10.4)
Chloride: 108 mmol/L (ref 98–110)
Creat: 1.6 mg/dL — ABNORMAL HIGH (ref 0.60–0.88)
GFR, Est African American: 34 mL/min/{1.73_m2} — ABNORMAL LOW (ref >=60)
GFR, Est Non African American: 30 mL/min/{1.73_m2} — ABNORMAL LOW (ref >=60)
Globulin: 2.6 g/dL (calc) (ref 1.9–3.7)
Glucose, Bld: 136 mg/dL — ABNORMAL HIGH (ref 65–99)
Potassium: 3.9 mmol/L (ref 3.5–5.3)
Sodium: 139 mmol/L (ref 135–146)
Total Bilirubin: 0.5 mg/dL (ref 0.2–1.2)
Total Protein: 6.5 g/dL (ref 6.1–8.1)

## 2019-06-28 LAB — CBC WITH DIFFERENTIAL/PLATELET
Absolute Monocytes: 777 cells/uL (ref 200–950)
Basophils Absolute: 42 cells/uL (ref 0–200)
Basophils Relative: 0.6 %
Eosinophils Absolute: 259 cells/uL (ref 15–500)
Eosinophils Relative: 3.7 %
HCT: 33.4 % — ABNORMAL LOW (ref 35.0–45.0)
Hemoglobin: 11.4 g/dL — ABNORMAL LOW (ref 11.7–15.5)
Lymphs Abs: 1792 cells/uL (ref 850–3900)
MCH: 31.3 pg (ref 27.0–33.0)
MCHC: 34.1 g/dL (ref 32.0–36.0)
MCV: 91.8 fL (ref 80.0–100.0)
MPV: 10.3 fL (ref 7.5–12.5)
Monocytes Relative: 11.1 %
Neutro Abs: 4130 cells/uL (ref 1500–7800)
Neutrophils Relative %: 59 %
Platelets: 231 10*3/uL (ref 140–400)
RBC: 3.64 10*6/uL — ABNORMAL LOW (ref 3.80–5.10)
RDW: 12.1 % (ref 11.0–15.0)
Total Lymphocyte: 25.6 %
WBC: 7 10*3/uL (ref 3.8–10.8)

## 2019-06-28 LAB — HEMOGLOBIN A1C
Hgb A1c MFr Bld: 9.5 % of total Hgb — ABNORMAL HIGH (ref <5.7)
Mean Plasma Glucose: 226 (calc)
eAG (mmol/L): 12.5 (calc)

## 2019-06-30 NOTE — Progress Notes (Signed)
Subjective: Monique Cabrera is a 84 y.o. y.o. female presents to clinic for preventative foot care today with diabetes and PAD. She presents for management of elongated, mycotic toenails which interfere with daily activities. Pain is aggravated when wearing enclosed shoe gear. Pain is relieved with periodic professional debridement.   Patient relates tenderness to left great toe stating it feels ingrown. She denies any redness, drainage or swelling. It is sore in enclosed shoe gear.  She also relates concern of skin peeling on her right foot and ankle. She states it has been peeling for the past month or so.  Reed, Tiffany L, DO is her PCP.  Last visit was 02/15/2019.  Objective: 84 y.o. AAF, obese in NAD. AAO x 3.  Vitals:   06/25/19 0819  Temp: (!) 97.3 F (36.3 C)  Vascular Examination: Capillary refill time <4 seconds x 10 digits.  Dorsalis pedis pulses absent b/l.  Posterior tibial pulses absent b/l.  No digital hair x 10 digits.  Skin temperature warm to cool b/l.  No ischemic changes noted b/l.  Dermatological Examination: Skin thin, shiny and atrophic b/l.  Toenails 1-5 b/l discolored, thick, dystrophic with subungual debris and pain with palpation to nailbeds due to thickness of nails.  Peeling skin noted along right lateral ankle area extending to foot. No blisters, no petechiae, no edema, no cellulitis, no flocculence, no warmth.  Musculoskeletal: Muscle strength 5/5 to all LE muscle groups.  HAV with bunion b/l.  Neurological: Sensation decreased 1/5 b/l with 10 gram monofilament.  Assessment: 1. Painful onychomycosis toenails 1-5 b/l 2. Rash right ankle/foot  3. NIDDM with Peripheral arterial disease  Plan: 1. Continue diabetic foot care principles. Literature dispensed. 2. Toenails 1-5 b/l were debrided in length and girth without iatrogenic bleeding. 3. Prescription written for Triamcinolone Cream 0.5 % to be applied to right foot ankle twice daily  for 30 days. 4. Patient to continue soft, supportive shoe gear daily. 5. Patient to report any pedal injuries to medical professional immediately. 6. Follow up 3 months.  7. Patient/POA to call should there be a concern in the interim.

## 2019-07-02 ENCOUNTER — Other Ambulatory Visit: Payer: Self-pay

## 2019-07-02 ENCOUNTER — Ambulatory Visit (INDEPENDENT_AMBULATORY_CARE_PROVIDER_SITE_OTHER): Payer: Medicare Other | Admitting: Internal Medicine

## 2019-07-02 ENCOUNTER — Encounter: Payer: Self-pay | Admitting: Internal Medicine

## 2019-07-02 VITALS — BP 150/80 | HR 64 | Temp 98.2°F | Ht 65.0 in | Wt 146.0 lb

## 2019-07-02 DIAGNOSIS — I10 Essential (primary) hypertension: Secondary | ICD-10-CM

## 2019-07-02 DIAGNOSIS — E785 Hyperlipidemia, unspecified: Secondary | ICD-10-CM

## 2019-07-02 DIAGNOSIS — B182 Chronic viral hepatitis C: Secondary | ICD-10-CM

## 2019-07-02 DIAGNOSIS — E1129 Type 2 diabetes mellitus with other diabetic kidney complication: Secondary | ICD-10-CM

## 2019-07-02 DIAGNOSIS — E113553 Type 2 diabetes mellitus with stable proliferative diabetic retinopathy, bilateral: Secondary | ICD-10-CM

## 2019-07-02 DIAGNOSIS — E1169 Type 2 diabetes mellitus with other specified complication: Secondary | ICD-10-CM | POA: Diagnosis not present

## 2019-07-02 DIAGNOSIS — E1165 Type 2 diabetes mellitus with hyperglycemia: Secondary | ICD-10-CM

## 2019-07-02 DIAGNOSIS — Z23 Encounter for immunization: Secondary | ICD-10-CM

## 2019-07-02 DIAGNOSIS — IMO0002 Reserved for concepts with insufficient information to code with codable children: Secondary | ICD-10-CM

## 2019-07-02 MED ORDER — BD PEN NEEDLE SHORT U/F 31G X 8 MM MISC: 1 refills | Status: DC

## 2019-07-02 MED ORDER — TETANUS-DIPHTH-ACELL PERTUSSIS 5-2.5-18.5 LF-MCG/0.5 IM SUSP: 0.5000 mL | Freq: Once | INTRAMUSCULAR | 0 refills | Status: AC

## 2019-07-02 NOTE — Patient Instructions (Addendum)
Please get back to eating regular meals and taking your insulin with your meals.  Please arrange to go to [facility] for your tdap vaccine.    I referred you to [name] for your eye exam and new glasses.    Try to walk around the block--just a short distance--when it's cooler out early in the morning.  Be sure to eat before your walk.

## 2019-07-02 NOTE — Progress Notes (Signed)
Location:  [facility] clinic Provider:  Sherrilynn Gudgel L. Mariea Clonts, D.O., C.M.D.  Code Status: DNR has been discussed a prior visit Goals of Care:  Advanced Directives 02/07/2019  Does Patient Have a Medical Advance Directive? Yes  Type of Advance Directive Out of facility DNR (pink MOST or yellow form)  Does patient want to make changes to medical advance directive? No - Patient declined  Would patient like information on creating a medical advance directive? -  Pre-existing out of facility DNR order (yellow form or pink MOST form) Yellow form placed in chart (order not valid for inpatient use)     Chief Complaint  Patient presents with  . Medical Management of Chronic Issues    36mth follow-up    HPI: Patient is a 84 y.o. Cabrera seen today for medical management of chronic diseases.    BP elevated this morning.  Did not take meds  hba1c up to 9.5 again from 8.9.  Had a period of low sugars, she says.  She would get cold sweats.  Reports it's been ok the past few weeks.  She does not have much energy.  She's been napping here lately.  She is back to having periods of not eating.  She nibbles and then skips a meal and skips her insulin.  She always eats at least twice.  I reminded her about how she does better if she has 3 consistent meals and does take her insulin.  She says she knows this does work better, but does not follow  She also is overdoing snacks.  Eyes:  She says they're not doing that good.  When she's home, she does not wear her glasses as much.  She noticed how much better she could see when she did wear them--could read.    She had her diabetic foot exam with podiatry.Had dry skin on feet and given cream for it (triamcinolone).  She had an ingrown toenail taken care of and thick nail debrided.  She sees her back soon.    Past Medical History:  Diagnosis Date  . Acute upper respiratory infections of unspecified site   . Anemia   . Anemia, unspecified   . Atherosclerosis of native  arteries of the extremities, unspecified   . Chest pain, unspecified   . Chronic kidney disease (CKD), stage II (mild)   . Diabetes mellitus   . Diarrhea   . Disorder of bone and cartilage, unspecified   . DM (diabetes mellitus) type II controlled with renal manifestation ([place])   . Herpes zoster with other nervous system complications(053.19)   . Hypercalcemia   . Hypertension   . Hypertensive renal disease, benign   . Nonspecific reaction to tuberculin skin test without active tuberculosis(795.51)   . Nonspecific tuberculin test reaction   . Other and unspecified hyperlipidemia   . Pain in joint, lower leg   . Peripheral arterial disease ([place])   . Postherpetic neuralgia   . Proteinuria   . Stroke ([place]) 01/2017  . Type II or unspecified type diabetes mellitus with renal manifestations, not stated as uncontrolled(250.40)   . Type II or unspecified type diabetes mellitus with renal manifestations, uncontrolled(250.42)   . Unspecified disorder of kidney and ureter   . Unspecified essential hypertension     Past Surgical History:  Procedure Laterality Date  . hysterectomy    . INCISION AND DRAINAGE Left 05/27/14   sebacous cyst, ear  . PRP Left    Dr. Ricki Miller  . removal of cyst from  hand    . removal of tumor from foot    . TONSILLECTOMY      Allergies  Allergen Reactions  . Invokana [Canagliflozin] Other (See Comments)    Vagina itching and swelling Vaginal Itching and irritation   . Jardiance [Empagliflozin] Itching and Other (See Comments)    Vaginal itching and swelling    Outpatient Encounter Medications as of 07/02/2019  Medication Sig  . amLODipine (NORVASC) 10 MG tablet TAKE 1 TABLET (10 MG TOTAL) BY MOUTH DAILY. FOR HIGH BLOOD PRESSURE  . atorvastatin (LIPITOR) 20 MG tablet TAKE 1 TABLET BY MOUTH EVERY DAY  . carvedilol (COREG) 25 MG tablet TAKE 1 TABLET BY MOUTH TWICE A DAY WITH A MEAL  . cloNIDine (CATAPRES) 0.1 MG tablet Take 1 tablet (0.1 mg total) by mouth  at bedtime.  . clopidogrel (PLAVIX) Monique MG tablet TAKE ONE TABLET BY MOUTH ONCE DAILY  . Insulin Glargine (LANTUS) 100 UNIT/ML Solostar Pen INJECT 50 UNITS UNDER THE SKIN DAILY FOR DIABETES  . insulin lispro (HUMALOG KWIKPEN) 100 UNIT/ML KiwkPen INJECT 6 UNITS BREAKFAST, 6 UNITS LUNCH & SUPPER  . Insulin Pen Needle (B-D ULTRAFINE III SHORT PEN) 31G X 8 MM MISC Use to check blood sugar every day. Dx: 11.29; 11.65  . ONETOUCH VERIO test strip USE TO TEST BLOOD SUGAR THREE TIMES DAILY. DX: E11.9  . triamcinolone cream (KENALOG) 0.5 % Apply to right foot/ankle twice daily for 30 days.  . valsartan-hydrochlorothiazide (DIOVAN-HCT) 320-25 MG tablet TAKE 1 TABLET BY MOUTH EVERY DAY   No facility-administered encounter medications on file as of 07/02/2019.     Review of Systems:  Review of Systems  Constitutional: Positive for malaise/fatigue. Negative for chills and fever.  Eyes: Positive for blurred vision.  Respiratory: Negative for cough and shortness of breath.   Cardiovascular: Negative for chest pain, palpitations and leg swelling.  Gastrointestinal: Negative for abdominal pain, blood in stool, constipation, diarrhea and melena.  Genitourinary: Negative for dysuria.  Musculoskeletal: Negative for falls and joint pain.  Skin: Negative for itching (dry skin of feet) and rash.  Neurological: Negative for dizziness and loss of consciousness.       Muscle cramps  Endo/Heme/Allergies: Does not bruise/bleed easily.  Psychiatric/Behavioral: Positive for memory loss. Negative for depression. The patient is not nervous/anxious and does not have insomnia.        Memory loss related to glucose control    Health Maintenance  Topic Date Due  . OPHTHALMOLOGY EXAM  01/26/2019  . FOOT EXAM  02/06/2019  . INFLUENZA VACCINE  07/28/2019  . HEMOGLOBIN A1C  12/28/2019  . DEXA SCAN  Completed  . PNA vac Low Risk Adult  Completed    Physical Exam: Vitals:   07/02/19 0907  BP: (!) 150/80  Pulse: 64    Temp: 98.2 F (36.8 C)  TempSrc: Oral  SpO2: 98%  Weight: 146 lb (66.2 kg)  Height: 5\' 5"  (1.651 m)   Body mass index is 24.3 kg/m. Physical Exam Vitals signs reviewed.  Constitutional:      General: She is not in acute distress.    Appearance: Normal appearance. She is normal weight. She is not toxic-appearing.  HENT:     Head: Normocephalic and atraumatic.  Eyes:     Comments: Glasses; eye contact not as good today  Cardiovascular:     Rate and Rhythm: Normal rate and regular rhythm.  Pulmonary:     Effort: Pulmonary effort is normal.     Breath sounds: Normal  breath sounds. No rales.  Musculoskeletal: Normal range of motion.  Skin:    General: Skin is warm and dry.  Neurological:     General: No focal deficit present.     Mental Status: She is alert and oriented to person, place, and time.     Cranial Nerves: No cranial nerve deficit.     Motor: No weakness.     Gait: Gait abnormal.  Psychiatric:        Mood and Affect: Mood normal.        Behavior: Behavior normal.     Labs reviewed: Basic Metabolic Panel: Recent Labs    10/25/18 0801 02/07/19 0900 06/27/19 0835  NA 137 137 139  K 4.0 4.0 3.9  CL 101 108 108  CO2 26 22 21   GLUCOSE 219* 183* 136*  BUN 27* 31* 36*  CREATININE 1.Monique* 1.54* 1.60*  CALCIUM 9.9 9.7 9.9   Liver Function Tests: Recent Labs    10/25/18 0801 02/07/19 0900 06/27/19 0835  AST 15 13 20   ALT 11 9 20   BILITOT 0.6 0.4 0.5  PROT 6.5 6.4 6.5   No results for input(s): LIPASE, AMYLASE in the last 8760 hours. No results for input(s): AMMONIA in the last 8760 hours. CBC: Recent Labs    10/25/18 0801 02/07/19 0900 06/27/19 0835  WBC 8.4 7.4 7.0  NEUTROABS 5,746 4,640 4,130  HGB 11.2* 10.8* 11.4*  HCT 32.5* 31.7* 33.4*  MCV 90.5 92.4 91.8  PLT 232 230 231   Lipid Panel: Recent Labs    10/25/18 0801 02/07/19 0900  CHOL 123 121  HDL 49* 48*  LDLCALC 58 60  TRIG 81 53  CHOLHDL 2.5 2.5   Lab Results  Component  Value Date   HGBA1C 9.5 (H) 06/27/2019    Assessment/Plan 1. Type II diabetes mellitus with renal manifestations, uncontrolled (HCC) - control remains poor--worse again w/ skipping meals and snacking, having highs and lows -reminded her about how regular meals and insulin use have worked best for her and she agrees to get back on track - Ambulatory referral to Ophthalmology - CBC with Differential/Platelet; Future - COMPLETE METABOLIC PANEL WITH GFR; Future - Hemoglobin A1c; Future - Lipid panel; Future  2. Chronic hepatitis C without hepatic coma ([place]) -not followed by GI for this - COMPLETE METABOLIC PANEL WITH GFR; Future  3. Essential hypertension, benign -bp elevated today, but turned out she did not take her am meds yet -recheck was higher -must take meds and eat breakfast before routine appts so we know if meds effective  4. Stable proliferative diabetic retinopathy of both eyes associated with type 2 diabetes mellitus ([place]) - is overdue for annual eye exam and wants to change doctors to a location where she can get her exam and her glasses at one location--lives near [facility] and [employer] - Ambulatory referral to Ophthalmology  5. Hyperlipidemia associated with type 2 diabetes mellitus ([place]) -cont llipitor therapy, f/u lab - CBC with Differential/Platelet; Future - COMPLETE METABOLIC PANEL WITH GFR; Future - Lipid panel; Future  6. Need for Tdap vaccination - Tdap ([place]) 5-2.5-18.5 LF-MCG/0.5 injection; Inject 0.5 mLs into the muscle once for 1 dose.  Dispense: 0.5 mL; Refill: 0  Labs/tests ordered:   Lab Orders     CBC with Differential/Platelet     COMPLETE METABOLIC PANEL WITH GFR     Hemoglobin A1c     Lipid panel  Next appt:  4 mos med mgt, fasting labs before  Buck Mcaffee L.  Adarius Tigges, D.O. [facility] [address], [place] [address] Cell Phone (Mon-Fri 8am-5pm):  [phone] On Call:  [phone] &  follow prompts after 5pm & weekends Office Phone:  [phone] Office Fax:  [phone]

## 2019-07-04 ENCOUNTER — Telehealth: Payer: Self-pay | Admitting: Internal Medicine

## 2019-07-04 NOTE — Telephone Encounter (Signed)
Referral was made for patient to see Dr. Rutherford Guys and she does not want to see him.

## 2019-07-19 ENCOUNTER — Other Ambulatory Visit: Payer: Self-pay

## 2019-07-19 NOTE — Patient Outreach (Signed)
[facility]) Care Management  07/19/2019   Monique Cabrera Oct 29, 1935 [id number]  Subjective: Successful call to the patient.  Two patient identifiers given.  The patient states that she is doing fine.  She denies any pain or falls. The patient's a1c went up from 8.9 to 9.5.  She states that she stays in the home due to covid.  She has been snaking more on chips, cookies, and candy.  She is not eating 3 meals a day and skipping her insulin if she does not eat.  Encouraged healthier food choices, having a protein with her meals and taking her meds as prescribed.  She verbalized understanding.  She states that she misses going to the [facility] but she will sit with her neighbors that are also in the home due to covid and talk.  She feels that helps her not to be lonely.  She states that she has an appointment on the 28 th of this month with [facility], follow up in October with podiatry and follow up for diabetes in November.   Current Medications:  Current Outpatient Medications  Medication Sig Dispense Refill  . amLODipine (NORVASC) 10 MG tablet TAKE 1 TABLET (10 MG TOTAL) BY MOUTH DAILY. FOR HIGH BLOOD PRESSURE 90 tablet 1  . atorvastatin (LIPITOR) 20 MG tablet TAKE 1 TABLET BY MOUTH EVERY DAY 90 tablet 1  . carvedilol (COREG) 25 MG tablet TAKE 1 TABLET BY MOUTH TWICE A DAY WITH A MEAL 180 tablet 1  . cloNIDine (CATAPRES) 0.1 MG tablet Take 1 tablet (0.1 mg total) by mouth at bedtime. 90 tablet 1  . clopidogrel (PLAVIX) 75 MG tablet TAKE ONE TABLET BY MOUTH ONCE DAILY 90 tablet 1  . Insulin Glargine (LANTUS) 100 UNIT/ML Solostar Pen INJECT 50 UNITS UNDER THE SKIN DAILY FOR DIABETES 45 pen 1  . insulin lispro (HUMALOG KWIKPEN) 100 UNIT/ML KiwkPen INJECT 6 UNITS BREAKFAST, 6 UNITS LUNCH & SUPPER 15 pen 1  . Insulin Pen Needle (B-D ULTRAFINE III SHORT PEN) 31G X 8 MM MISC Use to check blood sugar every day. Dx: 11.29; 11.65 100 each 1  . ONETOUCH VERIO test strip USE TO TEST BLOOD  SUGAR THREE TIMES DAILY. DX: E11.9 300 each 3  . triamcinolone cream (KENALOG) 0.5 % Apply to right foot/ankle twice daily for 30 days. 30 g 1  . valsartan-hydrochlorothiazide (DIOVAN-HCT) 320-25 MG tablet TAKE 1 TABLET BY MOUTH EVERY DAY 90 tablet 1   No current facility-administered medications for this visit.     Functional Status:  In your present state of health, do you have any difficulty performing the following activities: 02/07/2019  Hearing? N  Vision? Y  Comment follows up with Opthalmology   Difficulty concentrating or making decisions? Y  Comment forgetful sometimes   Walking or climbing stairs? N  Dressing or bathing? N  Doing errands, shopping? N  Preparing Food and eating ? N  Using the Toilet? N  In the past six months, have you accidently leaked urine? N  Do you have problems with loss of bowel control? N  Managing your Medications? N  Managing your Finances? N  Housekeeping or managing your Housekeeping? N  Some recent data might be hidden    Fall/Depression Screening: Fall Risk  07/19/2019 07/19/2019 07/02/2019  Falls in the past year? 0 0 0  Number falls in past yr: - - 0  Comment - - -  Injury with Fall? - - 0  Risk for fall due to : - - -  Follow up - - -   PHQ 2/9 Scores 07/02/2019 02/15/2019 02/07/2019 10/12/2018 06/05/2018 02/06/2018 10/14/2017  PHQ - 2 Score 0 0 0 0 0 0 2  PHQ- 9 Score - - - - - - 3    Assessment: Patient will continue to benefit from health coach outreach for disease management and support. [facility] CM Care Plan Problem One     Most Recent Value  [facility]N Long Term Goal   In 60 days the patient will decrease her a1c of 9.5 down by 0.2 points  [facility]N Long Term Goal Start Date  07/19/19  Interventions for Problem One Long Term Goal  Reviewed FBS, reviewed diet and talked with the patient about eating 3 meals a day with a protein.  Encouraged medication adherenc and stop skipping doses     Plan: [facility] will contact patient in the month of  September and patient agrees to next outreach.   Lazaro Arms RN, BSN, [place] Direct Dial:  [phone]  Fax: [phone]

## 2019-07-23 ENCOUNTER — Ambulatory Visit: Payer: Medicare Other

## 2019-07-24 DIAGNOSIS — E1121 Type 2 diabetes mellitus with diabetic nephropathy: Secondary | ICD-10-CM | POA: Diagnosis not present

## 2019-07-24 DIAGNOSIS — H5213 Myopia, bilateral: Secondary | ICD-10-CM | POA: Diagnosis not present

## 2019-08-10 ENCOUNTER — Ambulatory Visit (INDEPENDENT_AMBULATORY_CARE_PROVIDER_SITE_OTHER): Payer: Medicare Other | Admitting: Family

## 2019-08-10 ENCOUNTER — Encounter: Payer: Self-pay | Admitting: Family

## 2019-08-10 ENCOUNTER — Other Ambulatory Visit: Payer: Self-pay

## 2019-08-10 DIAGNOSIS — M7989 Other specified soft tissue disorders: Secondary | ICD-10-CM

## 2019-08-10 DIAGNOSIS — H5203 Hypermetropia, bilateral: Secondary | ICD-10-CM | POA: Diagnosis not present

## 2019-08-10 NOTE — Patient Instructions (Signed)
1.Apply ice daily for 5-10 minutes. 2. Elevate legs when seated.  3. Take Tylenol 500 mg tablet one by mouth every 6 hours as needed for pain. 4. Notify provider if symptoms not resolved or worsen or running any fever > 100.5

## 2019-08-10 NOTE — Progress Notes (Signed)
This service is provided via telemedicine  No vital signs collected/recorded due to the encounter was a telemedicine visit.   Location of patient (ex: home, work):  Home  Patient consents to a telephone visit:  Yes  Location of the provider (ex: office, home):  Office   Name of any referring provider:  Hollace Kinnier, DO  Names of all persons participating in the telemedicine service and their role in the encounter:  [name] Ngetich NP, Ruthell Rummage CMA, Weyman Croon   Time spent on call: Ruthell Rummage CMA spent 8  Minutes on phone with patient     [facility]  Provider: Marlowe Sax, NP   Code Status: DNR Goals of Care:  Advanced Directives 02/07/2019  Does Patient Have a Medical Advance Directive? Yes  Type of Advance Directive Out of facility DNR (pink MOST or yellow form)  Does patient want to make changes to medical advance directive? No - Patient declined  Would patient like information on creating a medical advance directive? -  Pre-existing out of facility DNR order (yellow form or pink MOST form) Yellow form placed in chart (order not valid for inpatient use)     Chief Complaint  Patient presents with  . Acute Visit    Feet pain in toes and swollen, patient states she thinks it started last thursday. She has tried rubbing alcohol to feet but did not help. States pain in ankel to big toe. Patient states she has a fatty tumor on bottom of right foot. Big toe on left foot hurts the most and just started today.     HPI: Patient is a 84 y.o. female seen today for an acute visit for swelling and pain on both Feet x 1 week.She has tried rubbing alcohol to feet but did not help.she states pain worst whenever she tries to walk.Pain is described as sore to touch or walking.she denies any fever,chills or injury to feet.Also denies any redness or increased warmth.     Past Medical History:  Diagnosis Date  . Acute upper respiratory infections of unspecified site   . Anemia    . Anemia, unspecified   . Atherosclerosis of native arteries of the extremities, unspecified   . Chest pain, unspecified   . Chronic kidney disease (CKD), stage II (mild)   . Diabetes mellitus   . Diarrhea   . Disorder of bone and cartilage, unspecified   . DM (diabetes mellitus) type II controlled with renal manifestation ([place])   . Herpes zoster with other nervous system complications(053.19)   . Hypercalcemia   . Hypertension   . Hypertensive renal disease, benign   . Nonspecific reaction to tuberculin skin test without active tuberculosis(795.51)   . Nonspecific tuberculin test reaction   . Other and unspecified hyperlipidemia   . Pain in joint, lower leg   . Peripheral arterial disease ([place])   . Postherpetic neuralgia   . Proteinuria   . Stroke ([place]) 01/2017  . Type II or unspecified type diabetes mellitus with renal manifestations, not stated as uncontrolled(250.40)   . Type II or unspecified type diabetes mellitus with renal manifestations, uncontrolled(250.42)   . Unspecified disorder of kidney and ureter   . Unspecified essential hypertension     Past Surgical History:  Procedure Laterality Date  . hysterectomy    . INCISION AND DRAINAGE Left 05/27/14   sebacous cyst, ear  . PRP Left    Dr. Ricki Miller  . removal of cyst from hand    . removal of  tumor from foot    . TONSILLECTOMY      Allergies  Allergen Reactions  . Invokana [Canagliflozin] Other (See Comments)    Vagina itching and swelling Vaginal Itching and irritation   . Jardiance [Empagliflozin] Itching and Other (See Comments)    Vaginal itching and swelling    Outpatient Encounter Medications as of 08/10/2019  Medication Sig  . amLODipine (NORVASC) 10 MG tablet TAKE 1 TABLET (10 MG TOTAL) BY MOUTH DAILY. FOR HIGH BLOOD PRESSURE  . atorvastatin (LIPITOR) 20 MG tablet TAKE 1 TABLET BY MOUTH EVERY DAY  . carvedilol (COREG) 25 MG tablet TAKE 1 TABLET BY MOUTH TWICE A DAY WITH A MEAL  . cloNIDine  (CATAPRES) 0.1 MG tablet Take 1 tablet (0.1 mg total) by mouth at bedtime.  . clopidogrel (PLAVIX) 75 MG tablet TAKE ONE TABLET BY MOUTH ONCE DAILY  . Insulin Glargine (LANTUS) 100 UNIT/ML Solostar Pen INJECT 50 UNITS UNDER THE SKIN DAILY FOR DIABETES  . insulin lispro (HUMALOG KWIKPEN) 100 UNIT/ML KiwkPen INJECT 6 UNITS BREAKFAST, 6 UNITS LUNCH & SUPPER  . Insulin Pen Needle (B-D ULTRAFINE III SHORT PEN) 31G X 8 MM MISC Use to check blood sugar every day. Dx: 11.29; 11.65  . ONETOUCH VERIO test strip USE TO TEST BLOOD SUGAR THREE TIMES DAILY. DX: E11.9  . triamcinolone cream (KENALOG) 0.5 % Apply to right foot/ankle twice daily for 30 days.  . valsartan-hydrochlorothiazide (DIOVAN-HCT) 320-25 MG tablet TAKE 1 TABLET BY MOUTH EVERY DAY   No facility-administered encounter medications on file as of 08/10/2019.     Review of Systems:  Review of Systems  Constitutional: Negative for appetite change, chills, fatigue and fever.  Respiratory: Negative for cough, chest tightness, shortness of breath and wheezing.   Cardiovascular: Negative for chest pain, palpitations and leg swelling.  Gastrointestinal: Negative for abdominal distention, abdominal pain, constipation, diarrhea, nausea and vomiting.  Endocrine: Negative for cold intolerance, heat intolerance, polydipsia, polyphagia and polyuria.  Musculoskeletal: Negative for joint swelling.       Has a cane but does not use it.   Skin: Negative for color change, pallor and rash.  Neurological: Negative for dizziness, light-headedness and headaches.       Chronic Tingling on feet   Psychiatric/Behavioral: Negative for agitation and sleep disturbance. The patient is not nervous/anxious.    Health Maintenance  Topic Date Due  . OPHTHALMOLOGY EXAM  01/26/2019  . FOOT EXAM  02/06/2019  . INFLUENZA VACCINE  07/28/2019  . HEMOGLOBIN A1C  12/28/2019  . DEXA SCAN  Completed  . PNA vac Low Risk Adult  Completed    Physical Exam: There were no  vitals filed for this visit. There is no height or weight on file to calculate BMI. Physical Exam  Unable to complete on telephone.  Labs reviewed: Basic Metabolic Panel: Recent Labs    10/25/18 0801 02/07/19 0900 06/27/19 0835  NA 137 137 139  K 4.0 4.0 3.9  CL 101 108 108  CO2 26 22 21   GLUCOSE 219* 183* 136*  BUN 27* 31* 36*  CREATININE 1.75* 1.54* 1.60*  CALCIUM 9.9 9.7 9.9   Liver Function Tests: Recent Labs    10/25/18 0801 02/07/19 0900 06/27/19 0835  AST 15 13 20   ALT 11 9 20   BILITOT 0.6 0.4 0.5  PROT 6.5 6.4 6.5   No results for input(s): LIPASE, AMYLASE in the last 8760 hours. No results for input(s): AMMONIA in the last 8760 hours. CBC: Recent Labs  10/25/18 0801 02/07/19 0900 06/27/19 0835  WBC 8.4 7.4 7.0  NEUTROABS 5,746 4,640 4,130  HGB 11.2* 10.8* 11.4*  HCT 32.5* 31.7* 33.4*  MCV 90.5 92.4 91.8  PLT 232 230 231   Lipid Panel: Recent Labs    10/25/18 0801 02/07/19 0900  CHOL 123 121  HDL 49* 48*  LDLCALC 58 60  TRIG 81 53  CHOLHDL 2.5 2.5   Lab Results  Component Value Date   HGBA1C 9.5 (H) 06/27/2019    Procedures since last visit:  Assessment/Plan  Bilateral swelling of feet Afebrile.swelling and sore feet x 1 week.No signs of infections reported.encouraged to apply ice daily for 5-10 minutes.Elevate legs when seated.  May take Tylenol 500 mg tablet one by mouth every 6 hours as needed for pain. Notify provider if symptoms not resolved or worsen or running any fever > 100.5  Will check labs to rule out gout if symptoms persist.  Labs/tests ordered: None  Next appt:  10/29/2019 Spent 11 minutes of non-face to face with patient

## 2019-08-11 ENCOUNTER — Other Ambulatory Visit: Payer: Self-pay | Admitting: Internal Medicine

## 2019-08-11 DIAGNOSIS — IMO0002 Reserved for concepts with insufficient information to code with codable children: Secondary | ICD-10-CM

## 2019-08-11 DIAGNOSIS — E1122 Type 2 diabetes mellitus with diabetic chronic kidney disease: Secondary | ICD-10-CM

## 2019-08-14 DIAGNOSIS — I129 Hypertensive chronic kidney disease with stage 1 through stage 4 chronic kidney disease, or unspecified chronic kidney disease: Secondary | ICD-10-CM | POA: Diagnosis not present

## 2019-08-14 DIAGNOSIS — R350 Frequency of micturition: Secondary | ICD-10-CM | POA: Diagnosis not present

## 2019-08-14 DIAGNOSIS — N183 Chronic kidney disease, stage 3 (moderate): Secondary | ICD-10-CM | POA: Diagnosis not present

## 2019-08-14 DIAGNOSIS — E1121 Type 2 diabetes mellitus with diabetic nephropathy: Secondary | ICD-10-CM | POA: Diagnosis not present

## 2019-08-14 DIAGNOSIS — D649 Anemia, unspecified: Secondary | ICD-10-CM | POA: Diagnosis not present

## 2019-08-14 DIAGNOSIS — E1122 Type 2 diabetes mellitus with diabetic chronic kidney disease: Secondary | ICD-10-CM | POA: Diagnosis not present

## 2019-08-23 ENCOUNTER — Ambulatory Visit (INDEPENDENT_AMBULATORY_CARE_PROVIDER_SITE_OTHER): Payer: Medicare Other | Admitting: Internal Medicine

## 2019-08-23 ENCOUNTER — Encounter: Payer: Self-pay | Admitting: Internal Medicine

## 2019-08-23 ENCOUNTER — Other Ambulatory Visit: Payer: Self-pay

## 2019-08-23 VITALS — BP 140/72 | HR 68 | Temp 97.9°F | Ht 65.0 in | Wt 146.0 lb

## 2019-08-23 DIAGNOSIS — M109 Gout, unspecified: Secondary | ICD-10-CM | POA: Diagnosis not present

## 2019-08-23 DIAGNOSIS — B353 Tinea pedis: Secondary | ICD-10-CM | POA: Diagnosis not present

## 2019-08-23 DIAGNOSIS — E1129 Type 2 diabetes mellitus with other diabetic kidney complication: Secondary | ICD-10-CM

## 2019-08-23 DIAGNOSIS — N3000 Acute cystitis without hematuria: Secondary | ICD-10-CM | POA: Diagnosis not present

## 2019-08-23 DIAGNOSIS — IMO0002 Reserved for concepts with insufficient information to code with codable children: Secondary | ICD-10-CM

## 2019-08-23 DIAGNOSIS — Z7189 Other specified counseling: Secondary | ICD-10-CM

## 2019-08-23 DIAGNOSIS — Z23 Encounter for immunization: Secondary | ICD-10-CM | POA: Diagnosis not present

## 2019-08-23 DIAGNOSIS — E1165 Type 2 diabetes mellitus with hyperglycemia: Secondary | ICD-10-CM

## 2019-08-23 MED ORDER — COLCHICINE 0.6 MG PO TABS: 0.6000 mg | ORAL_TABLET | Freq: Every day | ORAL | 0 refills | Status: DC

## 2019-08-23 NOTE — Progress Notes (Signed)
Location:  [facility] clinic Provider: Arlee Santosuosso L. Mariea Clonts, D.O., C.M.D.  Code Status: DNR--discussed last fall--see ACP note from then  Goals of Care:  Advanced Directives 02/07/2019  Does Patient Have a Medical Advance Directive? Yes  Type of Advance Directive Out of facility DNR (pink MOST or yellow form)  Does patient want to make changes to medical advance directive? No - Patient declined  Would patient like information on creating a medical advance directive? -  Pre-existing out of facility DNR order (yellow form or pink MOST form) Yellow form placed in chart (order not valid for inpatient use)     Chief Complaint  Patient presents with  . Acute Visit    swelling in legs and feet    HPI: Patient is a 84 y.o. female with diabetes mellitus 2 that's been uncontrolled, CKD, hyperlipidemia, PAD seen today for an acute visit for swelling of the first MTP region of her left foot.  She was seen already by [facility] about this.  She wound up getting treated for a UTI.  She says her nephrologist gave her three pills that she did take, but was told she did not have gout.  Reports the three pills helped.  Turns out the three pills were prednisone when we got the note from [facility] and checked with her pharmacy.  She continues to have enough pain that walking on her medial left foot has been challenging so she's been walking on the lateral aspects so that she's getting pain in her left lateral thigh as a result.  She's also had an open area b/w her right 4th and 5th toes which is wet and tender.  It burns.  Her skin of her feet is quite dry.  UTI resolved with keflex.  Past Medical History:  Diagnosis Date  . Acute upper respiratory infections of unspecified site   . Anemia   . Anemia, unspecified   . Atherosclerosis of native arteries of the extremities, unspecified   . Chest pain, unspecified   . Chronic kidney disease (CKD), stage II (mild)   . Diabetes mellitus   . Diarrhea   . Disorder of  bone and cartilage, unspecified   . DM (diabetes mellitus) type II controlled with renal manifestation ([place])   . Herpes zoster with other nervous system complications(053.19)   . Hypercalcemia   . Hypertension   . Hypertensive renal disease, benign   . Nonspecific reaction to tuberculin skin test without active tuberculosis(795.51)   . Nonspecific tuberculin test reaction   . Other and unspecified hyperlipidemia   . Pain in joint, lower leg   . Peripheral arterial disease ([place])   . Postherpetic neuralgia   . Proteinuria   . Stroke ([place]) 01/2017  . Type II or unspecified type diabetes mellitus with renal manifestations, not stated as uncontrolled(250.40)   . Type II or unspecified type diabetes mellitus with renal manifestations, uncontrolled(250.42)   . Unspecified disorder of kidney and ureter   . Unspecified essential hypertension     Past Surgical History:  Procedure Laterality Date  . hysterectomy    . INCISION AND DRAINAGE Left 05/27/14   sebacous cyst, ear  . PRP Left    Dr. Ricki Miller  . removal of cyst from hand    . removal of tumor from foot    . TONSILLECTOMY      Allergies  Allergen Reactions  . Invokana [Canagliflozin] Other (See Comments)    Vagina itching and swelling Vaginal Itching and irritation   .  Jardiance [Empagliflozin] Itching and Other (See Comments)    Vaginal itching and swelling    Outpatient Encounter Medications as of 08/23/2019  Medication Sig  . amLODipine (NORVASC) 10 MG tablet TAKE 1 TABLET (10 MG TOTAL) BY MOUTH DAILY. FOR HIGH BLOOD PRESSURE  . atorvastatin (LIPITOR) 20 MG tablet TAKE 1 TABLET BY MOUTH EVERY DAY  . carvedilol (COREG) 25 MG tablet TAKE 1 TABLET BY MOUTH TWICE A DAY WITH A MEAL  . cephALEXin (KEFLEX) 250 MG capsule Take 1 capsule by mouth 2 (two) times daily.  . cloNIDine (CATAPRES) 0.1 MG tablet Take 1 tablet (0.1 mg total) by mouth at bedtime.  . clopidogrel (PLAVIX) 75 MG tablet TAKE ONE TABLET BY MOUTH ONCE DAILY  .  Insulin Glargine (LANTUS) 100 UNIT/ML Solostar Pen INJECT 50 UNITS UNDER THE SKIN DAILY FOR DIABETES  . insulin lispro (HUMALOG KWIKPEN) 100 UNIT/ML KwikPen INJECT 6 UNITS BREAKFAST, 6 UNITS LUNCH & SUPPER  . Insulin Pen Needle (B-D ULTRAFINE III SHORT PEN) 31G X 8 MM MISC Use to check blood sugar every day. Dx: 11.29; 11.65  . ONETOUCH VERIO test strip USE TO TEST BLOOD SUGAR THREE TIMES DAILY. DX: E11.9  . triamcinolone cream (KENALOG) 0.5 % Apply to right foot/ankle twice daily for 30 days.  . valsartan-hydrochlorothiazide (DIOVAN-HCT) 320-25 MG tablet TAKE 1 TABLET BY MOUTH EVERY DAY   No facility-administered encounter medications on file as of 08/23/2019.     Review of Systems:  Review of Systems  Constitutional: Negative for chills, fever and malaise/fatigue.  HENT: Negative for congestion and hearing loss.   Eyes: Negative for blurred vision.       Glasses  Respiratory: Negative for cough and shortness of breath.   Cardiovascular: Negative for chest pain, palpitations and leg swelling.  Gastrointestinal: Negative for abdominal pain, blood in stool, constipation, diarrhea and melena.  Genitourinary: Negative for dysuria.  Musculoskeletal: Negative for falls and joint pain.  Skin: Negative for itching and rash.  Neurological: Negative for dizziness and loss of consciousness.  Endo/Heme/Allergies: Does not bruise/bleed easily.  Psychiatric/Behavioral: Positive for memory loss. Negative for depression. The patient is not nervous/anxious.     Health Maintenance  Topic Date Due  . OPHTHALMOLOGY EXAM  01/26/2019  . FOOT EXAM  02/06/2019  . INFLUENZA VACCINE  07/28/2019  . HEMOGLOBIN A1C  12/28/2019  . DEXA SCAN  Completed  . PNA vac Low Risk Adult  Completed    Physical Exam: Vitals:   08/23/19 1159  BP: 140/72  Pulse: 68  Temp: 97.9 F (36.6 C)  TempSrc: Oral  Weight: 146 lb (66.2 kg)  Height: 5\' 5"  (1.651 m)   Body mass index is 24.3 kg/m. Physical Exam Vitals  signs reviewed.  Constitutional:      General: She is not in acute distress.    Appearance: Normal appearance. She is normal weight. She is not toxic-appearing.  HENT:     Head: Normocephalic and atraumatic.  Cardiovascular:     Rate and Rhythm: Normal rate and regular rhythm.     Pulses: Normal pulses.     Heart sounds: Normal heart sounds.  Pulmonary:     Effort: Pulmonary effort is normal.     Breath sounds: Normal breath sounds.  Abdominal:     General: Bowel sounds are normal. There is no distension.     Palpations: Abdomen is soft. There is no mass.     Tenderness: There is no abdominal tenderness. There is no guarding.  Musculoskeletal: Normal range  of motion.     Comments: Left foot tender, erythematous and swollen over the left MTP region and into anterior aspect of arch  Skin:    Capillary Refill: Capillary refill takes less than 2 seconds.     Comments: Right foot has erythema and open area b/w 4th and 5th toes and 5th is tender; skin dry and scaly on both feet  Neurological:     General: No focal deficit present.     Mental Status: She is alert and oriented to person, place, and time.  Psychiatric:        Mood and Affect: Mood normal.     Labs reviewed: Basic Metabolic Panel: Recent Labs    10/25/18 0801 02/07/19 0900 06/27/19 0835  NA 137 137 139  K 4.0 4.0 3.9  CL 101 108 108  CO2 26 22 21   GLUCOSE 219* 183* 136*  BUN 27* 31* 36*  CREATININE 1.75* 1.54* 1.60*  CALCIUM 9.9 9.7 9.9   Liver Function Tests: Recent Labs    10/25/18 0801 02/07/19 0900 06/27/19 0835  AST 15 13 20   ALT 11 9 20   BILITOT 0.6 0.4 0.5  PROT 6.5 6.4 6.5   No results for input(s): LIPASE, AMYLASE in the last 8760 hours. No results for input(s): AMMONIA in the last 8760 hours. CBC: Recent Labs    10/25/18 0801 02/07/19 0900 06/27/19 0835  WBC 8.4 7.4 7.0  NEUTROABS 5,746 4,640 4,130  HGB 11.2* 10.8* 11.4*  HCT 32.5* 31.7* 33.4*  MCV 90.5 92.4 91.8  PLT 232 230 231     Lipid Panel: Recent Labs    10/25/18 0801 02/07/19 0900  CHOL 123 121  HDL 49* 48*  LDLCALC 58 60  TRIG 81 53  CHOLHDL 2.5 2.5   Lab Results  Component Value Date   HGBA1C 9.5 (H) 06/27/2019    Assessment/Plan 1. Acute gout of left foot, unspecified cause - appears this has already started to improve with the 3 days of prednisone she was given -seems like classic gout to me -will do short course of colchicine for her--she will use until resolved or used up -she will call if she does not get better - colchicine 0.6 MG tablet; Take 1 tablet (0.6 mg total) by mouth daily. Take two tablets today, then one daily until you use it up  Dispense: 20 tablet; Refill: 0 -she's not had gout before and it's not clear what led to this (CKD vs ham salad she was eating)  2. Type II diabetes mellitus with renal manifestations, uncontrolled (HCC) -hba1c 9.5 -pt reports that since that last check July 1st, she's been back to eat regular meals and taking her insulin as directed -she's historically done this for periods of time, then goes back to old habits of skipping meals and making poor food choices, not using insulin, getting lows -cognition clearer when she's on track and seems she is at this time -cont ARB, lantus and humalog  3. Acute cystitis without hematuria -resolved with keflex use  4. Tinea pedis of right foot -advised to buy otc athlete's foot cream for use to help heal the area b/w her last two toes of her right foot  5. Need for influenza vaccination - Flu Vaccine QUAD High Dose(Fluad) given  6. ACP (advance care planning) - Do not attempt resuscitation (DNR)  Labs/tests ordered:   Lab Orders  No laboratory test(s) ordered today  already has future AWV, lab and f/u appt upcoming  Next appt:  11/01/2019 with me  Milbert Bixler L. Nishanth Mccaughan, D.O. [facility] [address], [place] [address] Cell Phone (Mon-Fri 8am-5pm):   [phone] On Call:  [phone] & follow prompts after 5pm & weekends Office Phone:  [phone] Office Fax:  [phone]

## 2019-08-23 NOTE — Patient Instructions (Signed)
Get over the counter athlete's foot cream for between your toes.    Pick up the colchicine to use until your left foot pain and swelling get's better.

## 2019-08-28 DIAGNOSIS — Z899 Acquired absence of limb, unspecified: Secondary | ICD-10-CM

## 2019-08-28 HISTORY — DX: Acquired absence of limb, unspecified: Z89.9

## 2019-08-29 ENCOUNTER — Encounter: Payer: Self-pay | Admitting: Podiatry

## 2019-08-29 ENCOUNTER — Ambulatory Visit (INDEPENDENT_AMBULATORY_CARE_PROVIDER_SITE_OTHER): Payer: Medicare Other

## 2019-08-29 ENCOUNTER — Other Ambulatory Visit: Payer: Self-pay | Admitting: Podiatry

## 2019-08-29 ENCOUNTER — Ambulatory Visit (INDEPENDENT_AMBULATORY_CARE_PROVIDER_SITE_OTHER): Payer: Medicare Other | Admitting: Podiatry

## 2019-08-29 ENCOUNTER — Other Ambulatory Visit: Payer: Self-pay

## 2019-08-29 DIAGNOSIS — S92312A Displaced fracture of first metatarsal bone, left foot, initial encounter for closed fracture: Secondary | ICD-10-CM

## 2019-08-29 DIAGNOSIS — L97511 Non-pressure chronic ulcer of other part of right foot limited to breakdown of skin: Secondary | ICD-10-CM

## 2019-08-29 DIAGNOSIS — M7751 Other enthesopathy of right foot: Secondary | ICD-10-CM

## 2019-08-29 DIAGNOSIS — E08621 Diabetes mellitus due to underlying condition with foot ulcer: Secondary | ICD-10-CM

## 2019-08-29 DIAGNOSIS — M778 Other enthesopathies, not elsewhere classified: Secondary | ICD-10-CM

## 2019-08-29 MED ORDER — CEPHALEXIN 500 MG PO CAPS: 500.0000 mg | ORAL_CAPSULE | Freq: Two times a day (BID) | ORAL | 0 refills | Status: AC

## 2019-08-29 NOTE — Patient Instructions (Addendum)
DRESSING CHANGES RIGHT FOOT:    1. KEEP RIGHT  FOOT DRY AT ALL TIMES!!!!  2. CLEANSE ULCER WITH SALINE.  3. DAB DRY WITH GAUZE SPONGE.  4. APPLY A LIGHT AMOUNT OF IODOSORB TO BASE OF ULCER.  5. APPLY OUTER DRESSING/BAND-AID AS INSTRUCTED.  6. WEAR SURGICAL SHOE DAILY AT ALL TIMES.  7. DO NOT WALK BAREFOOT!!!  8.  IF YOU EXPERIENCE ANY FEVER, CHILLS, NIGHTSWEATS, NAUSEA OR VOMITING, ELEVATED OR LOW BLOOD SUGARS, REPORT TO EMERGENCY ROOM.  9. IF YOU EXPERIENCE INCREASED REDNESS, PAIN, SWELLING, DISCOLORATION, ODOR, PUS, DRAINAGE OR WARMTH OF YOUR FOOT, REPORT TO EMERGENCY ROOM.    Metatarsal Fracture A metatarsal fracture is a break in one of the five bones that connect the toes to the rest of the foot. This may also be called a forefoot fracture. A metatarsal fracture may be:  A crack in the surface of the bone (stress fracture). This often occurs in athletes.  A break all the way through the bone (complete fracture). The bone that connects to the little toe (fifth metatarsal) is most commonly fractured. Ballet dancers often fracture this bone. What are the causes? A metatarsal fracture may be caused by:  Sudden twisting of the foot.  Falling onto the foot.  Something heavy falling onto the foot.  Overuse or repetitive exercise. What increases the risk? This condition is more likely to develop in people who:  Play contact sports.  Do ballet.  Have a condition that causes the bones to become thin and brittle (osteoporosis).  Have a low calcium level. What are the signs or symptoms? Symptoms of this condition include:  Pain that gets worse when walking or standing.  Pain when pressing on the foot or moving the toes.  Swelling.  Bruising on the top or bottom of the foot. How is this diagnosed? This condition may be diagnosed based on:  Your symptoms.  Any recent foot injuries you have had.  A physical exam.  An X-ray of your foot. If you have a stress  fracture, it may not show up on an X-ray, and you may need other imaging tests, such as: ? A bone scan. ? CT scan. ? MRI. How is this treated? Treatment depends on how severe your fracture is and how the pieces of the broken bone line up with each other (alignment). Treatment may involve:  Wearing a cast, splint, or supportive boot on your foot.  Using crutches, and not putting any weight on your foot.  Having surgery to align broken bones (open reduction and internal fixation, ORIF).  Physical therapy.  Follow-up visits and X-rays to make sure you are healing. Follow these instructions at home: If you have a splint or a supportive boot:  Wear the splint or boot as told by your health care provider. Remove it only as told by your health care provider.  Loosen the splint or boot if your toes tingle, become numb, or turn cold and blue.  Keep the splint or boot clean.  If your splint or boot is not waterproof: ? Do not let it get wet. ? Cover it with a watertight covering when you take a bath or a shower. If you have a cast:  Do not stick anything inside the cast to scratch your skin. Doing that increases your risk for infection.  Check the skin around the cast every day. Tell your health care provider about any concerns.  You may put lotion on dry skin around the edges of  the cast. Do not put lotion on the skin underneath the cast.  Keep the cast clean.  If the cast is not waterproof: ? Do not let it get wet. ? Cover it with a watertight covering when you take a bath or a shower. Activity  Do not use your affected leg to support your body weight until your health care provider says that you can. Use crutches as directed.  Ask your health care provider what activities are safe for you during recovery, and ask what activities you need to avoid.  Do physical therapy exercises as directed. Driving  Do not drive or use heavy machinery while taking pain medicine.  Do not  drive while wearing a cast, splint, or boot on a foot that you use for driving. Managing pain, stiffness, and swelling   If directed, put ice on painful areas: ? Put ice in a plastic bag. ? Place a towel between your skin and the bag.  If you have a removable splint or boot, remove it as told by your health care provider.  If you have a cast, place a towel between your cast and the bag. ? Leave the ice on for 20 minutes, 2-3 times a day.  Move your toes often to avoid stiffness and to lessen swelling.  Raise (elevate) your lower leg above the level of your heart while you are sitting or lying down. General instructions  Do not put pressure on any part of the cast or splint until it is fully hardened. This may take several hours.  Take over-the-counter and prescription medicines only as told by your health care provider.  Do not use any products that contain nicotine or tobacco, such as cigarettes and e-cigarettes. These can delay bone healing. If you need help quitting, ask your health care provider.  Do not take baths, swim, or use a hot tub until your health care provider approves. Ask your health care provider if you may take showers.  Keep all follow-up visits as told by your health care provider. This is important. Contact a health care provider if you have:  Pain that gets worse or does not get better with medicine.  A fever.  A bad smell coming from your cast or splint. Get help right away if you have:  Any of the following in your toes or your foot, even after loosening your splint (if applicable): ? Numbness. ? Tingling. ? Coldness. ? Blue skin.  Redness or swelling that gets worse.  Pain that suddenly becomes severe. Summary  A metatarsal fracture is a break in one of the five bones that connect the toes to the rest of the foot.  Treatment depends on how severe your fracture is and how the pieces of the broken bone line up with each other (alignment). This  may include wearing a cast, splint, or supportive boot, or using crutches. Sometimes surgery is needed to align the bones.  Ice and elevate your foot to help lessen the pain and swelling.  Make sure you know what symptoms should cause you to get help right away. This information is not intended to replace advice given to you by your health care provider. Make sure you discuss any questions you have with your health care provider. Document Released: 09/04/2002 Document Revised: 04/05/2019 Document Reviewed: 01/09/2018 Elsevier Patient Education  [place].  Diabetes Mellitus and [place] care is an important part of your health, especially when you have diabetes. Diabetes may cause you to  have problems because of poor blood flow (circulation) to your feet and legs, which can cause your skin to:  Become thinner and drier.  Break more easily.  Heal more slowly.  Peel and crack. You may also have nerve damage (neuropathy) in your legs and feet, causing decreased feeling in them. This means that you may not notice minor injuries to your feet that could lead to more serious problems. Noticing and addressing any potential problems early is the best way to prevent future foot problems. How to care for your feet Foot hygiene  Wash your feet daily with warm water and mild soap. Do not use hot water. Then, pat your feet and the areas between your toes until they are completely dry. Do not soak your feet as this can dry your skin.  Trim your toenails straight across. Do not dig under them or around the cuticle. File the edges of your nails with an emery board or nail file.  Apply a moisturizing lotion or petroleum jelly to the skin on your feet and to dry, brittle toenails. Use lotion that does not contain alcohol and is unscented. Do not apply lotion between your toes. Shoes and socks  Wear clean socks or stockings every day. Make sure they are not too tight. Do not wear knee-high  stockings since they may decrease blood flow to your legs.  Wear shoes that fit properly and have enough cushioning. Always look in your shoes before you put them on to be sure there are no objects inside.  To break in new shoes, wear them for just a few hours a day. This prevents injuries on your feet. Wounds, scrapes, corns, and calluses  Check your feet daily for blisters, cuts, bruises, sores, and redness. If you cannot see the bottom of your feet, use a mirror or ask someone for help.  Do not cut corns or calluses or try to remove them with medicine.  If you find a minor scrape, cut, or break in the skin on your feet, keep it and the skin around it clean and dry. You may clean these areas with mild soap and water. Do not clean the area with peroxide, alcohol, or iodine.  If you have a wound, scrape, corn, or callus on your foot, look at it several times a day to make sure it is healing and not infected. Check for: ? Redness, swelling, or pain. ? Fluid or blood. ? Warmth. ? Pus or a bad smell. General instructions  Do not cross your legs. This may decrease blood flow to your feet.  Do not use heating pads or hot water bottles on your feet. They may burn your skin. If you have lost feeling in your feet or legs, you may not know this is happening until it is too late.  Protect your feet from hot and cold by wearing shoes, such as at the beach or on hot pavement.  Schedule a complete foot exam at least once a year (annually) or more often if you have foot problems. If you have foot problems, report any cuts, sores, or bruises to your health care provider immediately. Contact a health care provider if:  You have a medical condition that increases your risk of infection and you have any cuts, sores, or bruises on your feet.  You have an injury that is not healing.  You have redness on your legs or feet.  You feel burning or tingling in your legs or feet.  You have  pain or cramps  in your legs and feet.  Your legs or feet are numb.  Your feet always feel cold.  You have pain around a toenail. Get help right away if:  You have a wound, scrape, corn, or callus on your foot and: ? You have pain, swelling, or redness that gets worse. ? You have fluid or blood coming from the wound, scrape, corn, or callus. ? Your wound, scrape, corn, or callus feels warm to the touch. ? You have pus or a bad smell coming from the wound, scrape, corn, or callus. ? You have a fever. ? You have a red line going up your leg. Summary  Check your feet every day for cuts, sores, red spots, swelling, and blisters.  Moisturize feet and legs daily.  Wear shoes that fit properly and have enough cushioning.  If you have foot problems, report any cuts, sores, or bruises to your health care provider immediately.  Schedule a complete foot exam at least once a year (annually) or more often if you have foot problems. This information is not intended to replace advice given to you by your health care provider. Make sure you discuss any questions you have with your health care provider. Document Released: 12/10/2000 Document Revised: 01/25/2018 Document Reviewed: 01/14/2017 Elsevier Patient Education  2020 [employer].

## 2019-08-30 ENCOUNTER — Telehealth: Payer: Self-pay

## 2019-08-30 NOTE — Telephone Encounter (Signed)
Patient called to question if she should continue medication Dr.Reed prescribed. Patient was seen on 08/23/2019 and presented with left foot concerns. Patient states Dr.Reed did not know what was going on and concluded a possible gout flare up.   Patient was instructed to seek medical attention with the foot specialist if symptoms continued. Patient seen foot specialist yesterday and they concluded that patient has a broken foot.  Please advise if patient to continue medication for gout   Patient stated it is ok to leave a detailed message on voicemail if no answer for she is planning to leave the house for a few hours.

## 2019-08-30 NOTE — Telephone Encounter (Signed)
1.) Removed colchicine from medication list  2.) Called patient, no answer, and no voicemail. I will try to call again later

## 2019-08-30 NOTE — Progress Notes (Signed)
Subjective: Monique Cabrera presents today with diabetes and PAD with cc of painful left foot. She states about one month ago she was going to the bathroom during the night and felt a pop in her left foot. The next morning she experienced pain when ambulating. She states she did see her nephrologist who prescribed 3 pills. Her symptoms did not improve, so she went to see her PCP who worked her up for gout. She was given an Rx for colcicine and was told to follow up if her symptoms did not improve.   She also relates painful right foot 4th webspace. Duration is about one week. She states right 5th digit is sore.  She then scheduled appointment with our clinic.   Monique Cabrera also states she was told she needs a stent in her left lower extremity due to blockage.   Past Medical History:  Diagnosis Date  . Acute upper respiratory infections of unspecified site   . Anemia   . Anemia, unspecified   . Atherosclerosis of native arteries of the extremities, unspecified   . Chest pain, unspecified   . Chronic kidney disease (CKD), stage II (mild)   . Diabetes mellitus   . Diarrhea   . Disorder of bone and cartilage, unspecified   . DM (diabetes mellitus) type II controlled with renal manifestation ([place])   . Herpes zoster with other nervous system complications(053.19)   . Hypercalcemia   . Hypertension   . Hypertensive renal disease, benign   . Nonspecific reaction to tuberculin skin test without active tuberculosis(795.51)   . Nonspecific tuberculin test reaction   . Other and unspecified hyperlipidemia   . Pain in joint, lower leg   . Peripheral arterial disease ([place])   . Postherpetic neuralgia   . Proteinuria   . Stroke ([place]) 01/2017  . Type II or unspecified type diabetes mellitus with renal manifestations, not stated as uncontrolled(250.40)   . Type II or unspecified type diabetes mellitus with renal manifestations, uncontrolled(250.42)   . Unspecified disorder of kidney and ureter   .  Unspecified essential hypertension      Patient Active Problem List   Diagnosis Date Noted  . Stable proliferative diabetic retinopathy of both eyes associated with type 2 diabetes mellitus ([place]) 06/05/2018  . Acute CVA (cerebrovascular accident) ([place]) 02/18/2017  . Acute ischemic stroke ([place])   . Internuclear ophthalmoplegia of left eye   . Benign paroxysmal positional vertigo   . Abnormality of gait   . Acute onset of severe vertigo 02/17/2017  . Vertigo 02/17/2017  . Atherosclerosis of native arteries of the extremities with intermittent claudication 03/11/2014  . Diabetic retinopathy ([place]) 03/11/2014  . Retinal hemorrhage due to secondary diabetes ([place]) 03/11/2014  . Type II diabetes mellitus with renal manifestations, uncontrolled ([place]) 03/11/2014  . Chronic hepatitis C without hepatic coma ([place]) 03/11/2014  . Hyperlipidemia 03/11/2014  . Hypoglycemia 04/16/2013  . Disorder of bone and cartilage, unspecified   . Other and unspecified hyperlipidemia   . Essential hypertension, benign   . Atherosclerosis of native arteries of the extremities, unspecified   . Chronic kidney disease (CKD), stage II (mild)   . Peripheral arterial disease ([place])   . Anemia   . Postherpetic neuralgia   . Hypertension      Past Surgical History:  Procedure Laterality Date  . hysterectomy    . INCISION AND DRAINAGE Left 05/27/14   sebacous cyst, ear  . PRP Left    Dr. Ricki Miller  . removal of cyst  from hand    . removal of tumor from foot    . TONSILLECTOMY     Alpha-Beta Blockers carvedilol (COREG) 25 MG tablet    Antihypertensive Combinations valsartan-hydrochlorothiazide (DIOVAN-HCT) 320-25 MG tablet    Calcium Channel Blockers amLODipine (NORVASC) 10 MG tablet 10 mg, Daily  Corticosteroids - Topical triamcinolone cream (KENALOG) 0.5 %    Diagnostic Tests ONETOUCH VERIO test strip    Gout Agents colchicine 0.6 MG tablet 0.6 mg, Daily    HMG CoA Reductase Inhibitors atorvastatin  (LIPITOR) 20 MG tablet    Insulin Insulin Glargine (LANTUS) 100 UNIT/ML Solostar Pen    insulin lispro (HUMALOG KWIKPEN) 100 UNIT/ML KwikPen    Parenteral Therapy Supplies Insulin Pen Needle (B-D ULTRAFINE III SHORT PEN) 31G X 8 MM MISC    Platelet Aggregation Inhibitors clopidogrel (PLAVIX) 75 MG tablet    Allergies  Allergen Reactions  . Invokana [Canagliflozin] Other (See Comments)    Vagina itching and swelling Vaginal Itching and irritation   . Jardiance [Empagliflozin] Itching and Other (See Comments)    Vaginal itching and swelling     Social History   Occupational History    Comment: retired, [employer]  Tobacco Use  . Smoking status: Former Smoker    Types: Cigarettes  . Smokeless tobacco: Never Used  . Tobacco comment: Quit about age 43   Substance and Sexual Activity  . Alcohol use: No    Alcohol/week: 0.0 standard drinks  . Drug use: No  . Sexual activity: Never     Family History  Problem Relation Age of Onset  . Diabetes Mother   . Diabetes Father   . Diabetes Sister   . Diabetes Sister      Immunization History  Administered Date(s) Administered  . Fluad Quad(high Dose 65+) 08/23/2019  . Influenza, High Dose Seasonal PF 09/30/2018  . Influenza,inj,Quad PF,6+ Mos 09/24/2013, 09/19/2014, 09/13/2016  . Influenza-Unspecified 11/22/2009, 10/01/2011, 09/10/2015, 09/26/2017  . PPD Test 01/12/2010  . Pneumococcal Conjugate-13 11/24/2015  . Pneumococcal Polysaccharide-23 12/27/2005     Review of systems: Positive Findings in bold print.  Constitutional:  chills, fatigue, fever, sweats, weight change Communication: Optometrist, sign Ecologist, hand writing, iPad/Android device Head: headaches, head injury Eyes: changes in vision, eye pain, glaucoma, cataracts, macular degeneration, diplopia, glare,  light sensitivity, eyeglasses or contacts, blindness Ears nose mouth throat: hearing impaired, hearing aids,  ringing in ears, deaf, sign language,   vertigo,   nosebleeds,  rhinitis,  cold sores, snoring, swollen glands Cardiovascular: HTN, edema, arrhythmia, pacemaker in place, defibrillator in place, chest pain/tightness, chronic anticoagulation, blood clot, heart failure, MI Peripheral Vascular: leg cramps, varicose veins, blood clots, lymphedema, varicosities Respiratory:  difficulty breathing, denies congestion, SOB, wheezing, cough, emphysema Gastrointestinal: change in appetite or weight, abdominal pain, constipation, diarrhea, nausea, vomiting, vomiting blood, change in bowel habits, abdominal pain, jaundice, rectal bleeding, hemorrhoids, GERD Genitourinary:  nocturia,  pain on urination, polyuria,  blood in urine, Foley catheter, urinary urgency, ESRD on hemodialysis Musculoskeletal: amputation, cramping, stiff joints, painful joints, decreased joint motion, fractures, OA, gout, hemiplegia, paraplegia, uses cane, wheelchair bound, uses walker, uses rollator, painful feet Skin: +changes in toenails, color change, dryness, itching, mole changes,  rash, wound(s) Neurological: headaches, numbness in feet, paresthesias in feet, burning in feet, fainting,  seizures, change in speech. denies headaches, memory problems/poor historian, cerebral palsy, weakness, paralysis, CVA, TIA Endocrine: diabetes, hypothyroidism, hyperthyroidism,  goiter, dry mouth, flushing, heat intolerance,  cold intolerance,  excessive thirst, denies polyuria,  nocturia Hematological:  easy bleeding, excessive bleeding, easy bruising, enlarged lymph nodes, on long term blood thinner, history of past transusions Allergy/immunological:  hives, eczema, frequent infections, multiple drug allergies, seasonal allergies, transplant recipient, multiple food allergies Psychiatric:  anxiety, depression, mood disorder, suicidal ideations, hallucinations, insomnia  Objective:  Vascular Examination: Capillary refill time delayed x 10 digits.  Dorsalis pedis and posterior tibial  pulses absent b/l.  No digital hair x 10 digits.  Skin temperature warm to cool b/l.  No ischemia/gangrene noted b/l LE.  Dermatological Examination: Skin thin, shiny and atrophic b/l.  Webspace ulceration right 4th webspace. No odor, no edema, no evidence of deep space infection. Base of ulcer is pink and granular.  Toenails 1-5 b/l are painful, elongated, discolored, dystrophic with subungual debris. Pain with dorsal palpation of nailplates. No erythema, no edema, no drainage noted.   Musculoskeletal: Muscle strength examination deferred.  HAV with bunion b/l.  Pain along 1st metatarsal shaft left foot. There is edema left foot.   Neurological: Sensation decreased with 10 gram monofilament.  Xrays left foot reveal 1st met shaft ulceration at metaphyseal-diaphyseal junction. Distal fragment is displaced medially and superiorly.   Right foot xray reveals no bone erosion of digits to suggest osteomyelitis.  Assessment: Fracture 1st metatarsal left foot Diabetic ulceration 4th webspace right foot  Plan: 1. Patient also seen with Dr. Posey Pronto. Surgery not an option due to the age of the fracture, her comorbidities and her poor circulation.  2. Ulcer right 4th webspace was cleansed with wound cleanser. Iodosorb Gel was applied to base of wound with light dressing. 3. Prescription sent to pharmacy for Keflex 500 mg. Take 1 capsule (500 mg total) by mouth twice daily for 7 days. 4. Xrays b/l feet were performed and reviewed with patient.   5. Fracture boot was dispensed for left foot fracture. She is to wear at all times with exception of bathing/showering. 6. Patient was given instructions dressing changes for right foot was instructed to call immediately if any signs or symptoms of infection arise.  7. Patient is to follow up one week with Dr. Hardie Pulley for  ulceration right 4th webspace. Follow up 4 weeks for left foot fracture (new xrays).  8. Patient instructed to report to  emergency department with worsening appearance of ulcer/toe/foot, increased pain, foul odor, increased redness, swelling, drainage, fever, chills, nightsweats, nausea, vomiting, increased blood sugar.  9. Patient/POA related understanding.

## 2019-08-30 NOTE — Telephone Encounter (Signed)
Patient called back, gave instructions and pt understood.

## 2019-08-30 NOTE — Telephone Encounter (Signed)
Ok to stop all gout medication.  She had not reported any injury to her foot so I did not order xrays at that time and did not suspect fracture.

## 2019-09-06 ENCOUNTER — Ambulatory Visit (INDEPENDENT_AMBULATORY_CARE_PROVIDER_SITE_OTHER): Payer: Medicare Other

## 2019-09-06 ENCOUNTER — Other Ambulatory Visit: Payer: Self-pay

## 2019-09-06 ENCOUNTER — Other Ambulatory Visit: Payer: Self-pay | Admitting: Podiatry

## 2019-09-06 ENCOUNTER — Ambulatory Visit (INDEPENDENT_AMBULATORY_CARE_PROVIDER_SITE_OTHER): Payer: Medicare Other | Admitting: Podiatry

## 2019-09-06 VITALS — Temp 97.4°F

## 2019-09-06 DIAGNOSIS — L97511 Non-pressure chronic ulcer of other part of right foot limited to breakdown of skin: Secondary | ICD-10-CM | POA: Diagnosis not present

## 2019-09-06 DIAGNOSIS — S92312D Displaced fracture of first metatarsal bone, left foot, subsequent encounter for fracture with routine healing: Secondary | ICD-10-CM

## 2019-09-06 DIAGNOSIS — E08621 Diabetes mellitus due to underlying condition with foot ulcer: Secondary | ICD-10-CM | POA: Diagnosis not present

## 2019-09-06 DIAGNOSIS — S92312A Displaced fracture of first metatarsal bone, left foot, initial encounter for closed fracture: Secondary | ICD-10-CM

## 2019-09-11 ENCOUNTER — Emergency Department (HOSPITAL_COMMUNITY)
Admission: EM | Admit: 2019-09-11 | Discharge: 2019-09-11 | Disposition: A | Discharge status: Home / Self Care | Payer: Medicare Other | Attending: Emergency Medicine | Admitting: Emergency Medicine

## 2019-09-11 ENCOUNTER — Encounter (HOSPITAL_COMMUNITY): Payer: Self-pay | Admitting: *Deleted

## 2019-09-11 ENCOUNTER — Emergency Department (HOSPITAL_COMMUNITY): Payer: Medicare Other

## 2019-09-11 DIAGNOSIS — Z87891 Personal history of nicotine dependence: Secondary | ICD-10-CM | POA: Insufficient documentation

## 2019-09-11 DIAGNOSIS — Z79899 Other long term (current) drug therapy: Secondary | ICD-10-CM | POA: Insufficient documentation

## 2019-09-11 DIAGNOSIS — Z7901 Long term (current) use of anticoagulants: Secondary | ICD-10-CM | POA: Diagnosis not present

## 2019-09-11 DIAGNOSIS — N182 Chronic kidney disease, stage 2 (mild): Secondary | ICD-10-CM | POA: Insufficient documentation

## 2019-09-11 DIAGNOSIS — I129 Hypertensive chronic kidney disease with stage 1 through stage 4 chronic kidney disease, or unspecified chronic kidney disease: Secondary | ICD-10-CM | POA: Diagnosis not present

## 2019-09-11 DIAGNOSIS — I739 Peripheral vascular disease, unspecified: Secondary | ICD-10-CM | POA: Insufficient documentation

## 2019-09-11 DIAGNOSIS — M25871 Other specified joint disorders, right ankle and foot: Secondary | ICD-10-CM | POA: Diagnosis not present

## 2019-09-11 DIAGNOSIS — E1122 Type 2 diabetes mellitus with diabetic chronic kidney disease: Secondary | ICD-10-CM | POA: Insufficient documentation

## 2019-09-11 DIAGNOSIS — L03115 Cellulitis of right lower limb: Secondary | ICD-10-CM

## 2019-09-11 DIAGNOSIS — Z794 Long term (current) use of insulin: Secondary | ICD-10-CM | POA: Insufficient documentation

## 2019-09-11 DIAGNOSIS — L089 Local infection of the skin and subcutaneous tissue, unspecified: Secondary | ICD-10-CM | POA: Diagnosis not present

## 2019-09-11 DIAGNOSIS — J22 Unspecified acute lower respiratory infection: Secondary | ICD-10-CM | POA: Diagnosis not present

## 2019-09-11 DIAGNOSIS — M79671 Pain in right foot: Secondary | ICD-10-CM

## 2019-09-11 LAB — URINALYSIS, ROUTINE W REFLEX MICROSCOPIC
Bilirubin Urine: NEGATIVE
Glucose, UA: NEGATIVE mg/dL
Hgb urine dipstick: NEGATIVE
Ketones, ur: NEGATIVE mg/dL
Leukocytes,Ua: NEGATIVE
Nitrite: NEGATIVE
Protein, ur: 100 mg/dL — AB
Specific Gravity, Urine: 1.016 (ref 1.005–1.030)
pH: 5 (ref 5.0–8.0)

## 2019-09-11 LAB — CBC WITH DIFFERENTIAL/PLATELET
Abs Immature Granulocytes: 0.08 10*3/uL — ABNORMAL HIGH (ref 0.00–0.07)
Basophils Absolute: 0.1 10*3/uL (ref 0.0–0.1)
Basophils Relative: 1 %
Eosinophils Absolute: 0.1 10*3/uL (ref 0.0–0.5)
Eosinophils Relative: 2 %
HCT: 33.2 % — ABNORMAL LOW (ref 36.0–46.0)
Hemoglobin: 10.9 g/dL — ABNORMAL LOW (ref 12.0–15.0)
Immature Granulocytes: 1 %
Lymphocytes Relative: 25 %
Lymphs Abs: 2.3 10*3/uL (ref 0.7–4.0)
MCH: 31.2 pg (ref 26.0–34.0)
MCHC: 32.8 g/dL (ref 30.0–36.0)
MCV: 95.1 fL (ref 80.0–100.0)
Monocytes Absolute: 1.1 10*3/uL — ABNORMAL HIGH (ref 0.1–1.0)
Monocytes Relative: 11 %
Neutro Abs: 5.8 10*3/uL (ref 1.7–7.7)
Neutrophils Relative %: 60 %
Platelets: 275 10*3/uL (ref 150–400)
RBC: 3.49 MIL/uL — ABNORMAL LOW (ref 3.87–5.11)
RDW: 11.8 % (ref 11.5–15.5)
WBC: 9.5 10*3/uL (ref 4.0–10.5)
nRBC: 0 % (ref 0.0–0.2)

## 2019-09-11 LAB — COMPREHENSIVE METABOLIC PANEL
ALT: 13 U/L (ref 0–44)
AST: 18 U/L (ref 15–41)
Albumin: 3.4 g/dL — ABNORMAL LOW (ref 3.5–5.0)
Alkaline Phosphatase: 59 U/L (ref 38–126)
Anion gap: 12 (ref 5–15)
BUN: 32 mg/dL — ABNORMAL HIGH (ref 8–23)
CO2: 20 mmol/L — ABNORMAL LOW (ref 22–32)
Calcium: 10.3 mg/dL (ref 8.9–10.3)
Chloride: 104 mmol/L (ref 98–111)
Creatinine, Ser: 1.76 mg/dL — ABNORMAL HIGH (ref 0.44–1.00)
GFR calc Af Amer: 31 mL/min — ABNORMAL LOW (ref >60)
GFR calc non Af Amer: 26 mL/min — ABNORMAL LOW (ref >60)
Glucose, Bld: 135 mg/dL — ABNORMAL HIGH (ref 70–99)
Potassium: 3.9 mmol/L (ref 3.5–5.1)
Sodium: 136 mmol/L (ref 135–145)
Total Bilirubin: 0.6 mg/dL (ref 0.3–1.2)
Total Protein: 6.8 g/dL (ref 6.5–8.1)

## 2019-09-11 LAB — LACTIC ACID, PLASMA: Lactic Acid, Venous: 0.9 mmol/L (ref 0.5–1.9)

## 2019-09-11 MED ORDER — TRAMADOL HCL 50 MG PO TABS
50.0000 mg | ORAL_TABLET | Freq: Once | ORAL | Status: AC
  Administered 2019-09-11: 50 mg via ORAL
  Filled 2019-09-11: qty 1

## 2019-09-11 MED ORDER — DOXYCYCLINE HYCLATE 100 MG PO TABS
100.0000 mg | ORAL_TABLET | Freq: Once | ORAL | Status: AC
  Administered 2019-09-11: 100 mg via ORAL
  Filled 2019-09-11: qty 1

## 2019-09-11 MED ORDER — AMOXICILLIN-POT CLAVULANATE 500-125 MG PO TABS: 500.0000 mg | ORAL_TABLET | Freq: Two times a day (BID) | ORAL | 0 refills | Status: AC

## 2019-09-11 MED ORDER — TRAMADOL HCL 50 MG PO TABS: 50.0000 mg | ORAL_TABLET | Freq: Four times a day (QID) | ORAL | 0 refills | Status: DC | PRN

## 2019-09-11 MED ORDER — DOXYCYCLINE HYCLATE 100 MG PO CAPS: 100.0000 mg | ORAL_CAPSULE | Freq: Two times a day (BID) | ORAL | 0 refills | Status: AC

## 2019-09-11 MED ORDER — CEPHALEXIN 250 MG PO CAPS: 250.0000 mg | ORAL_CAPSULE | Freq: Once | ORAL | Status: DC

## 2019-09-11 MED ORDER — SODIUM CHLORIDE 0.9% FLUSH: 3.0000 mL | Freq: Once | INTRAVENOUS | Status: DC

## 2019-09-11 MED ORDER — AMOXICILLIN-POT CLAVULANATE 500-125 MG PO TABS
1.0000 | ORAL_TABLET | Freq: Once | ORAL | Status: AC
  Administered 2019-09-11: 17:00:00 500 mg via ORAL
  Filled 2019-09-11: qty 1

## 2019-09-11 NOTE — ED Triage Notes (Signed)
Pt has bil foot boots on upon arrival to ED, pt states, "I broke the left foot and the right one has a ulcer between the little toe and the one next to it.My foot doctor looked at it and told me to wear the boot to keep them from rubbing together." pt has DM, c/o throbbing with ambulation, A&O x4, afebrile

## 2019-09-11 NOTE — Progress Notes (Signed)
Orthopedic Tech Progress Note Patient Details:  Monique Cabrera 10/30/1935 [id number]  Ortho Devices Type of Ortho Device: Crutches Ortho Device/Splint Interventions: Application   Post Interventions Patient Tolerated: Ambulated well Instructions Provided: Care of device   [name] 09/11/2019, 3:51 PM

## 2019-09-11 NOTE — ED Notes (Signed)
Pt c/o pain at right foot. Swelling noted at distal 1/3 of foot and great toe. Pt c/o sore between 4/5 toes. Drainage noted at same

## 2019-09-11 NOTE — Discharge Instructions (Signed)
Your work-up today shows evidence of cellulitis on your foot.  As you completed antibiotics last week and it continued to worsen, we discussed admission for IV antibiotics however as your laboratory testing, vital signs, and x-ray did not show deeper or more systemic infection, we think it is reasonable to use 2 different new antibiotics to treat as an outpatient.  Please take the antibiotics and use the pain medicine and use the crutches.  Please follow-up with your PCP and podiatry.  If any symptoms change or worsen or the infection begins to spread, please return to the nearest emergency department medially for likely admission and IV antibiotics.

## 2019-09-11 NOTE — ED Notes (Signed)
Pt verbalized understanding of discharge paperwork and follow-up care.  °

## 2019-09-11 NOTE — ED Provider Notes (Signed)
[facility] EMERGENCY DEPARTMENT Provider Note   CSN: [id number] Arrival date & time: 09/11/19  1112     History   Chief Complaint Chief Complaint  Patient presents with   Foot Pain    foot infection/diabetic    HPI Monique Cabrera is a 84 y.o. female.     The history is provided by the patient and medical records. No language interpreter was used.  Foot Pain This is a new problem. The current episode started more than 1 week ago. The problem occurs constantly. The problem has been gradually worsening. Pertinent negatives include no chest pain, no abdominal pain, no headaches and no shortness of breath. The symptoms are aggravated by walking. Nothing relieves the symptoms. She has tried nothing for the symptoms. The treatment provided no relief.    Past Medical History:  Diagnosis Date   Acute upper respiratory infections of unspecified site    Anemia    Anemia, unspecified    Atherosclerosis of native arteries of the extremities, unspecified    Chest pain, unspecified    Chronic kidney disease (CKD), stage II (mild)    Diabetes mellitus    Diarrhea    Disorder of bone and cartilage, unspecified    DM (diabetes mellitus) type II controlled with renal manifestation (HCC)    Herpes zoster with other nervous system complications(053.19)    Hypercalcemia    Hypertension    Hypertensive renal disease, benign    Nonspecific reaction to tuberculin skin test without active tuberculosis(795.51)    Nonspecific tuberculin test reaction    Other and unspecified hyperlipidemia    Pain in joint, lower leg    Peripheral arterial disease (HCC)    Postherpetic neuralgia    Proteinuria    Stroke ([place]) 01/2017   Type II or unspecified type diabetes mellitus with renal manifestations, not stated as uncontrolled(250.40)    Type II or unspecified type diabetes mellitus with renal manifestations, uncontrolled(250.42)    Unspecified disorder of  kidney and ureter    Unspecified essential hypertension     Patient Active Problem List   Diagnosis Date Noted   Stable proliferative diabetic retinopathy of both eyes associated with type 2 diabetes mellitus ([place]) 06/05/2018   Acute CVA (cerebrovascular accident) ([place]) 02/18/2017   Acute ischemic stroke ([place])    Internuclear ophthalmoplegia of left eye    Benign paroxysmal positional vertigo    Abnormality of gait    Acute onset of severe vertigo 02/17/2017   Vertigo 02/17/2017   Atherosclerosis of native arteries of the extremities with intermittent claudication 03/11/2014   Diabetic retinopathy ([place]) 03/11/2014   Retinal hemorrhage due to secondary diabetes ([place]) 03/11/2014   Type II diabetes mellitus with renal manifestations, uncontrolled ([place]) 03/11/2014   Chronic hepatitis C without hepatic coma ([place]) 03/11/2014   Hyperlipidemia 03/11/2014   Hypoglycemia 04/16/2013   Disorder of bone and cartilage, unspecified    Other and unspecified hyperlipidemia    Essential hypertension, benign    Atherosclerosis of native arteries of the extremities, unspecified    Chronic kidney disease (CKD), stage II (mild)    Peripheral arterial disease ([place])    Anemia    Postherpetic neuralgia    Hypertension     Past Surgical History:  Procedure Laterality Date   hysterectomy     INCISION AND DRAINAGE Left 05/27/14   sebacous cyst, ear   PRP Left    Dr. Ricki Miller   removal of cyst from hand     removal of  tumor from foot     TONSILLECTOMY       OB History   No obstetric history on file.      Home Medications    Prior to Admission medications   Medication Sig Start Date End Date Taking? Authorizing Provider  amLODipine (NORVASC) 10 MG tablet TAKE 1 TABLET (10 MG TOTAL) BY MOUTH DAILY. FOR HIGH BLOOD PRESSURE 06/13/19   Reed, Tiffany L, DO  atorvastatin (LIPITOR) 20 MG tablet TAKE 1 TABLET BY MOUTH EVERY DAY 06/11/19   Despina Hick, MD  carvedilol  (COREG) 25 MG tablet TAKE 1 TABLET BY MOUTH TWICE A DAY WITH A MEAL 12/26/18   Reed, Tiffany L, DO  clopidogrel (PLAVIX) 75 MG tablet TAKE ONE TABLET BY MOUTH ONCE DAILY 12/26/18   Reed, Tiffany L, DO  Insulin Glargine (LANTUS) 100 UNIT/ML Solostar Pen INJECT 50 UNITS UNDER THE SKIN DAILY FOR DIABETES 01/15/19   Reed, Tiffany L, DO  insulin lispro (HUMALOG KWIKPEN) 100 UNIT/ML KwikPen INJECT 6 UNITS BREAKFAST, 6 UNITS LUNCH & SUPPER 08/13/19   Reed, Tiffany L, DO  Insulin Pen Needle (B-D ULTRAFINE III SHORT PEN) 31G X 8 MM MISC Use to check blood sugar every day. Dx: 11.29; 11.65 07/02/19   Reed, Tiffany L, DO  ONETOUCH VERIO test strip USE TO TEST BLOOD SUGAR THREE TIMES DAILY. DX: E11.9 07/17/18   Reed, Tiffany L, DO  triamcinolone cream (KENALOG) 0.5 % Apply to right foot/ankle twice daily for 30 days. 06/25/19   Marzetta Board, DPM  valsartan-hydrochlorothiazide (DIOVAN-HCT) 320-25 MG tablet TAKE 1 TABLET BY MOUTH EVERY DAY 01/08/19   [name], DO    Family History Family History  Problem Relation Age of Onset   Diabetes Mother    Diabetes Father    Diabetes Sister    Diabetes Sister     Social History Social History   Tobacco Use   Smoking status: Former Smoker    Types: Cigarettes   Smokeless tobacco: Never Used   Tobacco comment: Quit about age 5   Substance Use Topics   Alcohol use: No    Alcohol/week: 0.0 standard drinks   Drug use: No     Allergies   Invokana [canagliflozin] and Jardiance [empagliflozin]   Review of Systems Review of Systems  Constitutional: Negative for chills, diaphoresis, fatigue and fever.  HENT: Negative for congestion.   Respiratory: Negative for cough, choking, chest tightness, shortness of breath and wheezing.   Cardiovascular: Negative for chest pain.  Gastrointestinal: Negative for abdominal pain, constipation, diarrhea, nausea and vomiting.  Genitourinary: Negative for dysuria and flank pain.  Musculoskeletal: Negative  for back pain, neck pain and neck stiffness.  Skin: Positive for rash. Negative for wound.  Neurological: Negative for weakness, light-headedness, numbness and headaches.  Psychiatric/Behavioral: Negative for agitation.  All other systems reviewed and are negative.    Physical Exam Updated Vital Signs BP (!) 147/77 (BP Location: Right Arm)    Pulse 74    Temp 99.2 F (37.3 C) (Oral)    Resp 16    SpO2 98%   Physical Exam Vitals signs and nursing note reviewed.  Constitutional:      General: She is not in acute distress.    Appearance: She is well-developed. She is not diaphoretic.  HENT:     Head: Normocephalic and atraumatic.     Right Ear: External ear normal.     Left Ear: External ear normal.     Nose: Nose normal. No congestion  or rhinorrhea.     Mouth/Throat:     Mouth: Mucous membranes are moist.     Pharynx: No oropharyngeal exudate.  Eyes:     Conjunctiva/sclera: Conjunctivae normal.     Pupils: Pupils are equal, round, and reactive to light.  Neck:     Musculoskeletal: Normal range of motion and neck supple.  Cardiovascular:     Rate and Rhythm: Normal rate.     Pulses: Normal pulses.     Heart sounds: No murmur.  Pulmonary:     Effort: No respiratory distress.     Breath sounds: No stridor.  Abdominal:     General: There is no distension.     Tenderness: There is no abdominal tenderness. There is no right CVA tenderness, left CVA tenderness or rebound.  Musculoskeletal:        General: Tenderness present. No swelling or signs of injury.     Right lower leg: No edema.     Left lower leg: No edema.     Right foot: Normal range of motion and normal capillary refill. Tenderness present. No swelling, crepitus, deformity or laceration.       Feet:  Skin:    General: Skin is warm.     Capillary Refill: Capillary refill takes less than 2 seconds.     Findings: Erythema present. No rash.  Neurological:     General: No focal deficit present.     Mental Status:  She is alert and oriented to person, place, and time.     Sensory: No sensory deficit.     Motor: No weakness or abnormal muscle tone.     Deep Tendon Reflexes: Reflexes are normal and symmetric.  Psychiatric:        Mood and Affect: Mood normal.      ED Treatments / Results  Labs (all labs ordered are listed, but only abnormal results are displayed) Labs Reviewed  COMPREHENSIVE METABOLIC PANEL - Abnormal; Notable for the following components:      Result Value   CO2 20 (*)    Glucose, Bld 135 (*)    BUN 32 (*)    Creatinine, Ser 1.76 (*)    Albumin 3.4 (*)    GFR calc non Af Amer 26 (*)    GFR calc Af Amer 31 (*)    All other components within normal limits  CBC WITH DIFFERENTIAL/PLATELET - Abnormal; Notable for the following components:   RBC 3.49 (*)    Hemoglobin 10.9 (*)    HCT 33.2 (*)    Monocytes Absolute 1.1 (*)    Abs Immature Granulocytes 0.08 (*)    All other components within normal limits  URINALYSIS, ROUTINE W REFLEX MICROSCOPIC - Abnormal; Notable for the following components:   APPearance CLOUDY (*)    Protein, ur 100 (*)    Bacteria, UA MANY (*)    All other components within normal limits  LACTIC ACID, PLASMA  LACTIC ACID, PLASMA    EKG None  Radiology Dg Chest 2 View  Result Date: 09/11/2019 CLINICAL DATA:  Infection. EXAM: CHEST - 2 VIEW COMPARISON:  Radiographs of February 17, 2017. FINDINGS: The heart size and mediastinal contours are within normal limits. Both lungs are clear. Atherosclerosis of thoracic aorta is noted. The visualized skeletal structures are unremarkable. IMPRESSION: No active cardiopulmonary disease. Aortic Atherosclerosis (ICD10-I70.0). Electronically Signed   By: Marijo Conception M.D.   On: 09/11/2019 12:13   Dg Foot Complete Right  Result Date: 09/11/2019 CLINICAL DATA:  Infection. EXAM: RIGHT FOOT COMPLETE - 3+ VIEW COMPARISON:  09/06/2019. FINDINGS: Diffuse osteopenia and degenerative change. Old healed angulated fracture  of the distal right fifth metatarsal noted. No acute bony abnormality identified. No bony erosions. No soft tissue foreign bodies. Peripheral vascular calcification. IMPRESSION: 1. Diffuse osteopenia in degenerative change. Old left fifth metatarsal fracture. No acute bony abnormality identified. No bony erosions. No soft tissue foreign body. 2.  Peripheral vascular disease. Electronically Signed   By: Marcello Moores  Register   On: 09/11/2019 12:14    Procedures Procedures (including critical care time)  Medications Ordered in ED Medications  sodium chloride flush (NS) 0.9 % injection 3 mL (3 mLs Intravenous Not Given 09/11/19 1638)  traMADol (ULTRAM) tablet 50 mg (50 mg Oral Given 09/11/19 1620)  amoxicillin-clavulanate (AUGMENTIN) 500-125 MG per tablet 500 mg (500 mg Oral Given 09/11/19 1635)  doxycycline (VIBRA-TABS) tablet 100 mg (100 mg Oral Given 09/11/19 1620)     Initial Impression / Assessment and Plan / ED Course  I have reviewed the triage vital signs and the nursing notes.  Pertinent labs & imaging results that were available during my care of the patient were reviewed by me and considered in my medical decision making (see chart for details).        Monique Cabrera is a 84 y.o. female with a past medical history significant for hypertension, hyperlipidemia, prior stroke, diabetes, CKD, recent left foot fracture in a walking boot, who presents with right foot pain.  Patient reports that for the last 2 weeks she has had pain and redness develop on her right dorsum of the foot.  She reports it is very painful.  She denies fevers, chills, nausea, vomiting, urinary symptoms or GI symptoms.  She denies any systemic signs of infection but she is concerned that she has an infection starting between her fourth and fifth toes on the right foot.  She reports no diabetic neuropathy at this time.  She has never had serious infections of her feet.  She does not take any medicine for symptoms and was  told to come in to likely get antibiotics.  On exam, patient does have some warmth and erythema to the dorsum of her right foot.  There is some tenderness but no fluctuance.  Good sensation and strength in the toes.  The webspace does appear slightly infected.  Good pulses.  No tenderness in the knee or hip or abdomen.  No chest or back tenderness.  Lungs clear.  Patient resting comfortably with reassuring vital signs on arrival.  Patient is afebrile she is not tachycardic.  Patient had some screening work-up in triage including labs, chest x-ray, and urinalysis.  Patient also got x-ray of the foot to look for osteomyelitis.  X-ray of the foot showed no osteomyelitis or acute bony abnormality but did show osteopenia.  Patient was warned of this finding but we did not see evidence of foot infection.  Initial lactic acid was normal with no leukocytosis.  Other labs similar to prior with known kidney dysfunction similar to prior.  Patient and I had a shared decision made conversation about management and as she has not failed outpatient antibiotics yet, we decided to attempt oral antibiotics, oral pain medicine, and have her try outpatient antibiotic therapy for likely foot cellulitis that has not progressed to osteomyelitis at this time.  Patient still awaiting results of urinalysis however if urine is reassuring, anticipate discharge with good return precautions and follow-up.  3:44 PM Just spoke  with the pharmacy team who recommended 250 mg of Keflex every 8 hours for antibiotic management.   4:02 PM Prior to receiving the Keflex, a further chart review shows that patient actually completed 1 week of oral Keflex by podiatry last week.  I spoke with patient again and offered admission for IV antibiotics as she has technically failed outpatient antibiotic management however she would still like to try going home.  Spoke again with pharmacy who now recommends 10 days of Augmentin 500 mg and doxycycline.   A dose of these were ordered for the emergency department.  Urine shows no infection.  Patient's pain felt much better after medication.  Patient will follow-up with podiatry and PCP and understand strict return precautions.  She had no other questions or concerns and was discharged in good condition with improved symptoms.   Final Clinical Impressions(s) / ED Diagnoses   Final diagnoses:  Right foot pain  Cellulitis of right lower extremity    ED Discharge Orders         Ordered    amoxicillin-clavulanate (AUGMENTIN) 500-125 MG tablet  2 times daily     09/11/19 1737    doxycycline (VIBRAMYCIN) 100 MG capsule  2 times daily     09/11/19 1737    traMADol (ULTRAM) 50 MG tablet  Every 6 hours PRN     09/11/19 1747          Clinical Impression: 1. Right foot pain   2. Cellulitis of right lower extremity     Disposition: Discharge  Condition: Good  I have discussed the results, Dx and Tx plan with the pt(& family if present). He/she/they expressed understanding and agree(s) with the plan. Discharge instructions discussed at great length. Strict return precautions discussed and pt &/or family have verbalized understanding of the instructions. No further questions at time of discharge.    New Prescriptions   AMOXICILLIN-CLAVULANATE (AUGMENTIN) 500-125 MG TABLET    Take 1 tablet (500 mg total) by mouth 2 (two) times daily for 10 days.   DOXYCYCLINE (VIBRAMYCIN) 100 MG CAPSULE    Take 1 capsule (100 mg total) by mouth 2 (two) times daily for 10 days.   TRAMADOL (ULTRAM) 50 MG TABLET    Take 1 tablet (50 mg total) by mouth every 6 (six) hours as needed.    Follow Up: Reed, Tiffany L, DO [place]. [place] [address] [phone]     [place], Jennifer L, DPM [address] [place] [address] [phone]        Lonzo Saulter, Gwenyth Allegra, MD 09/11/19 [phone]

## 2019-09-14 ENCOUNTER — Encounter: Payer: Medicare Other | Admitting: Family

## 2019-09-14 ENCOUNTER — Other Ambulatory Visit: Payer: Self-pay

## 2019-09-14 ENCOUNTER — Telehealth: Payer: Self-pay

## 2019-09-14 NOTE — Progress Notes (Signed)
  This encounter was created in error - please disregard. Patient did not answer her phone for telephone visit.

## 2019-09-14 NOTE — Progress Notes (Signed)
This service is provided via telemedicine  No vital signs collected/recorded due to the encounter was a telemedicine visit.   Location of patient (ex: home, work):  Home  Patient consents to a telephone visit:  Yes  Location of the provider (ex: office, home):  Office  Name of any referring provider:  N/A  Names of all persons participating in the telemedicine service and their role in the encounter: Marisa Cyphers RMA, [name] Ngetich NP, Weyman Croon  Time spent on call:  10 min  This encounter was created in error - please disregard.

## 2019-09-14 NOTE — Telephone Encounter (Addendum)
Called patients residence first try no answer. I called the daughter to se if there was a different number to reach patient but she said there was not.

## 2019-09-17 ENCOUNTER — Other Ambulatory Visit: Payer: Self-pay

## 2019-09-17 ENCOUNTER — Ambulatory Visit: Payer: Self-pay | Admitting: Family

## 2019-09-17 ENCOUNTER — Encounter: Payer: Self-pay | Admitting: Family

## 2019-09-17 ENCOUNTER — Ambulatory Visit (INDEPENDENT_AMBULATORY_CARE_PROVIDER_SITE_OTHER): Payer: Medicare Other | Admitting: Family

## 2019-09-17 VITALS — BP 118/58 | HR 78 | Temp 98.0°F | Ht 65.0 in | Wt 144.4 lb

## 2019-09-17 DIAGNOSIS — IMO0002 Reserved for concepts with insufficient information to code with codable children: Secondary | ICD-10-CM

## 2019-09-17 DIAGNOSIS — E1129 Type 2 diabetes mellitus with other diabetic kidney complication: Secondary | ICD-10-CM

## 2019-09-17 DIAGNOSIS — S81801D Unspecified open wound, right lower leg, subsequent encounter: Secondary | ICD-10-CM | POA: Diagnosis not present

## 2019-09-17 DIAGNOSIS — E1165 Type 2 diabetes mellitus with hyperglycemia: Secondary | ICD-10-CM

## 2019-09-17 NOTE — Progress Notes (Signed)
Provider: Ronda Rajkumar FNP-C  [name], DO  Patient Care Team: [name], DO as PCP - General (Geriatric Medicine) Adrian Prows, MD as Consulting Physician (Cardiology) Lazaro Arms, RN as [place] Management Rutherford Guys, MD as Consulting Physician (Ophthalmology) Justin Mend, MD as Attending Physician (Internal Medicine)  Extended Emergency Contact Information Primary Emergency Contact: UQJFHLK,TGYB Address: [address]          [place], [place] [address] Johnnette Litter of [place] Phone: [phone] Relation: Daughter Secondary Emergency Contact: Kimberlee Nearing [place] of [place] Phone: [phone] Relation: Daughter  Code Status:  Goals of care: Advanced Directive information Advanced Directives 09/11/2019  Does Patient Have a Medical Advance Directive? No  Type of Advance Directive -  Does patient want to make changes to medical advance directive? -  Would patient like information on creating a medical advance directive? -  Pre-existing out of facility DNR order (yellow form or pink MOST form) -     Chief Complaint  Patient presents with  . Acute Visit    Wound on right foot. Patient states she has not been feeling well, each morning she wakes up feeling sick and not like herself.     HPI:  Pt is a 84 y.o. female seen today for an acute visit for ED follow up for right foot wound x 2 weeks.Patient states she has not been feeling well, each morning she wakes up feeling sick and not like herself.she was seen in the ED.she noted to have pain and redness of her right dorsum of the foot.she noted to have an infection between her 4 th and 5th toes of the right foot.she as a significant medical history of Type 2 DM but denied neuropathy.X-ray of the right foot showed no osteomyelitis or acute bone abnormality except osteopenia.Lab work,CXR,urine analysis was unremarkable.[place] to Hospital visit she was seen by Podiatrist and was  treated with Keflex.she declined IV antibiotic as the ED and was treated with Augmentin 500 mg tablet and Doxycycline for 10 days.she states has been taking the antibiotics though unclear which one she is taking.she states 10 day course almost completed.she is worried that the wound has been draining to her socks.  She has Orthopedic boot on left leg tells me she fractured her foot.she follows up with Orthopedic.she walks with crutches.          Past Medical History:  Diagnosis Date  . Acute upper respiratory infections of unspecified site   . Anemia   . Anemia, unspecified   . Atherosclerosis of native arteries of the extremities, unspecified   . Chest pain, unspecified   . Chronic kidney disease (CKD), stage II (mild)   . Diabetes mellitus   . Diarrhea   . Disorder of bone and cartilage, unspecified   . DM (diabetes mellitus) type II controlled with renal manifestation ([place])   . Herpes zoster with other nervous system complications(053.19)   . Hypercalcemia   . Hypertension   . Hypertensive renal disease, benign   . Nonspecific reaction to tuberculin skin test without active tuberculosis(795.51)   . Nonspecific tuberculin test reaction   . Other and unspecified hyperlipidemia   . Pain in joint, lower leg   . Peripheral arterial disease ([place])   . Postherpetic neuralgia   . Proteinuria   . Stroke ([place]) 01/2017  . Type II or unspecified type diabetes mellitus with renal manifestations, not stated as uncontrolled(250.40)   . Type II or unspecified type diabetes  mellitus with renal manifestations, uncontrolled(250.42)   . Unspecified disorder of kidney and ureter   . Unspecified essential hypertension    Past Surgical History:  Procedure Laterality Date  . hysterectomy    . INCISION AND DRAINAGE Left 05/27/14   sebacous cyst, ear  . PRP Left    Dr. Ricki Miller  . removal of cyst from hand    . removal of tumor from foot    . TONSILLECTOMY      Allergies  Allergen Reactions  .  Invokana [Canagliflozin] Other (See Comments)    Vagina itching and swelling Vaginal Itching and irritation   . Jardiance [Empagliflozin] Itching and Other (See Comments)    Vaginal itching and swelling    Outpatient Encounter Medications as of 09/17/2019  Medication Sig  . amLODipine (NORVASC) 10 MG tablet TAKE 1 TABLET (10 MG TOTAL) BY MOUTH DAILY. FOR HIGH BLOOD PRESSURE  . amoxicillin-clavulanate (AUGMENTIN) 500-125 MG tablet Take 1 tablet (500 mg total) by mouth 2 (two) times daily for 10 days.  Marland Kitchen atorvastatin (LIPITOR) 20 MG tablet TAKE 1 TABLET BY MOUTH EVERY DAY  . carvedilol (COREG) 25 MG tablet TAKE 1 TABLET BY MOUTH TWICE A DAY WITH A MEAL  . clopidogrel (PLAVIX) 75 MG tablet TAKE ONE TABLET BY MOUTH ONCE DAILY  . doxycycline (VIBRAMYCIN) 100 MG capsule Take 1 capsule (100 mg total) by mouth 2 (two) times daily for 10 days.  . Insulin Glargine (LANTUS) 100 UNIT/ML Solostar Pen INJECT 50 UNITS UNDER THE SKIN DAILY FOR DIABETES  . insulin lispro (HUMALOG KWIKPEN) 100 UNIT/ML KwikPen INJECT 6 UNITS BREAKFAST, 6 UNITS LUNCH & SUPPER  . Insulin Pen Needle (B-D ULTRAFINE III SHORT PEN) 31G X 8 MM MISC Use to check blood sugar every day. Dx: 11.29; 11.65  . ONETOUCH VERIO test strip USE TO TEST BLOOD SUGAR THREE TIMES DAILY. DX: E11.9  . traMADol (ULTRAM) 50 MG tablet Take 1 tablet (50 mg total) by mouth every 6 (six) hours as needed.  . triamcinolone cream (KENALOG) 0.5 % Apply to right foot/ankle twice daily for 30 days.  . valsartan-hydrochlorothiazide (DIOVAN-HCT) 320-25 MG tablet TAKE 1 TABLET BY MOUTH EVERY DAY   No facility-administered encounter medications on file as of 09/17/2019.     Review of Systems  Constitutional: Negative for appetite change, chills, fatigue and fever.  HENT: Negative for congestion, rhinorrhea, sinus pressure, sinus pain, sneezing and sore throat.   Eyes: Negative for pain, discharge, redness and itching.  Respiratory: Negative for cough, chest  tightness, shortness of breath and wheezing.   Cardiovascular: Negative for chest pain, palpitations and leg swelling.  Gastrointestinal: Negative for abdominal distention, abdominal pain, constipation, diarrhea, nausea and vomiting.  Genitourinary: Negative for difficulty urinating, dysuria, flank pain, frequency and urgency.  Musculoskeletal: Positive for arthralgias and gait problem.  Skin: Positive for wound. Negative for color change, pallor and rash.       Right foot wound per HPI   Neurological: Negative for dizziness, weakness, light-headedness, numbness and headaches.  Psychiatric/Behavioral: Negative for agitation and sleep disturbance. The patient is not nervous/anxious.     Immunization History  Administered Date(s) Administered  . Fluad Quad(high Dose 65+) 08/23/2019  . Influenza, High Dose Seasonal PF 09/30/2018  . Influenza,inj,Quad PF,6+ Mos 09/24/2013, 09/19/2014, 09/13/2016  . Influenza-Unspecified 11/22/2009, 10/01/2011, 09/10/2015, 09/26/2017  . PPD Test 01/12/2010  . Pneumococcal Conjugate-13 11/24/2015  . Pneumococcal Polysaccharide-23 12/27/2005   Pertinent  Health Maintenance Due  Topic Date Due  . OPHTHALMOLOGY EXAM  01/26/2019  .  FOOT EXAM  02/06/2019  . HEMOGLOBIN A1C  12/28/2019  . INFLUENZA VACCINE  Completed  . DEXA SCAN  Completed  . PNA vac Low Risk Adult  Completed   Fall Risk  09/17/2019 08/10/2019 07/19/2019 07/19/2019 07/02/2019  Falls in the past year? 0 0 0 0 0  Number falls in past yr: 0 0 - - 0  Comment - - - - -  Injury with Fall? - 0 - - 0  Risk for fall due to : - - - - -  Follow up - - - - -   Functional Status Survey:    Vitals:   09/17/19 1347  BP: (!) 118/58  Pulse: 78  Temp: 98 F (36.7 C)  SpO2: 99%  Weight: 144 lb 6.4 oz (65.5 kg)  Height: '5\' 5"'  (1.651 m)   Body mass index is 24.03 kg/m. Physical Exam Vitals signs reviewed.  Constitutional:      General: She is not in acute distress.    Appearance: She is normal  weight. She is not ill-appearing.  HENT:     Mouth/Throat:     Mouth: Mucous membranes are moist.     Pharynx: Oropharynx is clear. No oropharyngeal exudate or posterior oropharyngeal erythema.  Eyes:     General: No scleral icterus.       Right eye: No discharge.        Left eye: No discharge.     Conjunctiva/sclera: Conjunctivae normal.     Pupils: Pupils are equal, round, and reactive to light.  Cardiovascular:     Rate and Rhythm: Normal rate and regular rhythm.     Pulses: Normal pulses.     Heart sounds: Normal heart sounds. No murmur. No friction rub. No gallop.   Pulmonary:     Effort: Pulmonary effort is normal. No respiratory distress.     Breath sounds: Normal breath sounds. No wheezing, rhonchi or rales.  Chest:     Chest wall: No tenderness.  Abdominal:     General: Bowel sounds are normal. There is no distension.     Palpations: Abdomen is soft. There is no mass.     Tenderness: There is no abdominal tenderness. There is no right CVA tenderness, left CVA tenderness, guarding or rebound.  Musculoskeletal:        General: No swelling.     Right lower leg: No edema.     Left lower leg: No edema.     Comments: Unsteady gait ambulates with bilateral crutches.Right 4th and 5th toes web small open wound draining moderate amounts of serous-sanguinous drainage.dorsal right foot below toes area dark skin noted and very tender to touch.   Skin:    General: Skin is warm and dry.     Coloration: Skin is not pale.     Findings: No erythema or rash.     Comments: Wound on Right foot 4th and 5th toes web small open wound draining moderate amounts of serous-sanguinous drainage.dorsal right foot below toes area dark skin noted and very tender to touch. Wound cleansed with saline,pat dry,triple antibiotic ointment applied,covered with 2 X 2 gauze and foot wrapped with Kerlix for protections of wound and absorption.   Neurological:     Mental Status: She is alert and oriented to person,  place, and time.     Cranial Nerves: No cranial nerve deficit.     Sensory: No sensory deficit.     Motor: No weakness.     Coordination: Coordination normal.  Gait: Gait abnormal.  Psychiatric:        Mood and Affect: Mood normal.        Speech: Speech normal.        Behavior: Behavior normal.        Thought Content: Thought content normal.        Cognition and Memory: She exhibits impaired recent memory.        Judgment: Judgment normal.    Labs reviewed: Recent Labs    02/07/19 0900 06/27/19 0835 09/11/19 1136  NA 137 139 136  K 4.0 3.9 3.9  CL 108 108 104  CO2 22 21 20*  GLUCOSE 183* 136* 135*  BUN 31* 36* 32*  CREATININE 1.54* 1.60* 1.76*  CALCIUM 9.7 9.9 10.3   Recent Labs    02/07/19 0900 06/27/19 0835 09/11/19 1136  AST '13 20 18  ' ALT '9 20 13  ' ALKPHOS  --   --  59  BILITOT 0.4 0.5 0.6  PROT 6.4 6.5 6.8  ALBUMIN  --   --  3.4*   Recent Labs    02/07/19 0900 06/27/19 0835 09/11/19 1136  WBC 7.4 7.0 9.5  NEUTROABS 4,640 4,130 5.8  HGB 10.8* 11.4* 10.9*  HCT 31.7* 33.4* 33.2*  MCV 92.4 91.8 95.1  PLT 230 231 275   No results found for: TSH Lab Results  Component Value Date   HGBA1C 9.5 (H) 06/27/2019   Lab Results  Component Value Date   CHOL 121 02/07/2019   HDL 48 (L) 02/07/2019   LDLCALC 60 02/07/2019   TRIG 53 02/07/2019   CHOLHDL 2.5 02/07/2019    Significant Diagnostic Results in last 30 days:  Dg Chest 2 View  Result Date: 09/11/2019 CLINICAL DATA:  Infection. EXAM: CHEST - 2 VIEW COMPARISON:  Radiographs of February 17, 2017. FINDINGS: The heart size and mediastinal contours are within normal limits. Both lungs are clear. Atherosclerosis of thoracic aorta is noted. The visualized skeletal structures are unremarkable. IMPRESSION: No active cardiopulmonary disease. Aortic Atherosclerosis (ICD10-I70.0). Electronically Signed   By: Marijo Conception M.D.   On: 09/11/2019 12:13   Dg Foot Complete Left  Result Date: 09/06/2019 Please  see detailed radiograph report in office note.  Dg Foot Complete Left  Result Date: 08/29/2019 Please see detailed radiograph report in office note.  Dg Foot Complete Right  Result Date: 09/11/2019 CLINICAL DATA:  Infection. EXAM: RIGHT FOOT COMPLETE - 3+ VIEW COMPARISON:  09/06/2019. FINDINGS: Diffuse osteopenia and degenerative change. Old healed angulated fracture of the distal right fifth metatarsal noted. No acute bony abnormality identified. No bony erosions. No soft tissue foreign bodies. Peripheral vascular calcification. IMPRESSION: 1. Diffuse osteopenia in degenerative change. Old left fifth metatarsal fracture. No acute bony abnormality identified. No bony erosions. No soft tissue foreign body. 2.  Peripheral vascular disease. Electronically Signed   By: Marcello Moores  Register   On: 09/11/2019 12:14   Dg Foot Complete Right  Result Date: 08/29/2019 Please see detailed radiograph report in office note.   Assessment/Plan 1. Wound of right lower extremity, subsequent encounter Afebrile.Wound on Right foot 4th and 5th toes web small open wound draining moderate amounts of serous-sanguinous drainage.dorsal right foot below toes area dark skin noted and very tender to touch. Wound cleansed with saline,pat dry,triple antibiotic ointment applied,covered with 2 X 2 gauze and foot wraped with kerlix for protections of wound and absorption.  - AMB referral to wound care center for evaluation of wound.patient high risk due to Type 2 DM. -  CBC with Differential/Platelet - CMP with eGFR([facility]) - For home use only DME Other see comment: wound care orders to be faxed to [facility]. - Ambulatory referral to [facility] for wound management and Type 2 Dm education.   2. Type 2 DM  No CBG log for review.encourged to check CBG at home and record.HHN for diabetic education to improve wound healing. Continue current medication.   Family/ staff Communication: Reviewed plan of care with patient.  Labs/tests  ordered:  - CBC with Differential/Platelet - CMP with eGFR([facility])  Time spent with patient 25 minutes >50% time spent counseling; reviewing medical record; tests; labs; and developing future plan of care  Sandrea Hughs, NP

## 2019-09-17 NOTE — Patient Instructions (Signed)
Cleanse right 4th-5th toe web wound with saline,pat dry apply calcium alginate to keep wound dry,cover with 2 X 2 gauge and wrap foot with Kerlix for protection and secure with paper tape.

## 2019-09-18 LAB — COMPLETE METABOLIC PANEL WITH GFR
AG Ratio: 1.1 (calc) (ref 1.0–2.5)
ALT: 8 U/L (ref 6–29)
AST: 13 U/L (ref 10–35)
Albumin: 3.5 g/dL — ABNORMAL LOW (ref 3.6–5.1)
Alkaline phosphatase (APISO): 70 U/L (ref 37–153)
BUN/Creatinine Ratio: 22 (calc) (ref 6–22)
BUN: 38 mg/dL — ABNORMAL HIGH (ref 7–25)
CO2: 24 mmol/L (ref 20–32)
Calcium: 10.6 mg/dL — ABNORMAL HIGH (ref 8.6–10.4)
Chloride: 98 mmol/L (ref 98–110)
Creat: 1.71 mg/dL — ABNORMAL HIGH (ref 0.60–0.88)
GFR, Est African American: 32 mL/min/{1.73_m2} — ABNORMAL LOW (ref >=60)
GFR, Est Non African American: 27 mL/min/{1.73_m2} — ABNORMAL LOW (ref >=60)
Globulin: 3.2 g/dL (calc) (ref 1.9–3.7)
Glucose, Bld: 226 mg/dL — ABNORMAL HIGH (ref 65–139)
Potassium: 3.8 mmol/L (ref 3.5–5.3)
Sodium: 134 mmol/L — ABNORMAL LOW (ref 135–146)
Total Bilirubin: 0.4 mg/dL (ref 0.2–1.2)
Total Protein: 6.7 g/dL (ref 6.1–8.1)

## 2019-09-18 LAB — CBC WITH DIFFERENTIAL/PLATELET
Absolute Monocytes: 1295 cells/uL — ABNORMAL HIGH (ref 200–950)
Basophils Absolute: 61 cells/uL (ref 0–200)
Basophils Relative: 0.5 %
Eosinophils Absolute: 85 cells/uL (ref 15–500)
Eosinophils Relative: 0.7 %
HCT: 31.4 % — ABNORMAL LOW (ref 35.0–45.0)
Hemoglobin: 10.6 g/dL — ABNORMAL LOW (ref 11.7–15.5)
Lymphs Abs: 2686 cells/uL (ref 850–3900)
MCH: 31 pg (ref 27.0–33.0)
MCHC: 33.8 g/dL (ref 32.0–36.0)
MCV: 91.8 fL (ref 80.0–100.0)
MPV: 9.9 fL (ref 7.5–12.5)
Monocytes Relative: 10.7 %
Neutro Abs: 7974 cells/uL — ABNORMAL HIGH (ref 1500–7800)
Neutrophils Relative %: 65.9 %
Platelets: 366 10*3/uL (ref 140–400)
RBC: 3.42 10*6/uL — ABNORMAL LOW (ref 3.80–5.10)
RDW: 11.8 % (ref 11.0–15.0)
Total Lymphocyte: 22.2 %
WBC: 12.1 10*3/uL — ABNORMAL HIGH (ref 3.8–10.8)

## 2019-09-20 ENCOUNTER — Other Ambulatory Visit: Payer: Self-pay

## 2019-09-20 ENCOUNTER — Ambulatory Visit: Payer: Medicare Other | Admitting: Podiatry

## 2019-09-20 NOTE — Patient Outreach (Signed)
[facility]) Care Management  09/20/2019   KIERRA JEZEWSKI 09-15-1935 [id number]  Subjective: Successful outreach to the patient for assessment.  Two patient identifiers obtained.  The patient states that she is doing fair.  She had broken her left foot and has an ulcer on her right foot.  She has been seen by her physician and is on antibiotics for the wound.  It has been cleaned and dressed.  The physician has put in an order for the patient to have [facility] come for wound changes.  The patient states that she is not able to get around like she wants to and is a little sad.  She is taking her medications but she feel that the antibiotics make her sick on the stomach.  It is not as bad as it was when she started taking them.  She states that she is eating before she takes the medication but not enough.  Advised her of heathy food options and explained that she needs a well balance diet to help with wound healing.  She verbalized understanding.  She also understands that she needs to keep her blood sugars down.  She did not check her blood sugars this morning but states that they range between 115-215.  She missed her appointment today to follow up on her foot because she states she was unsure when it was.  Went over the appointments that she has coming up on the 30th and 10/5.  Advised her to call and confirm her appointments.  Advised the patient that I have been moved to another position and one of my fellow coworkers will follow her.  Current Medications:  Current Outpatient Medications  Medication Sig Dispense Refill  . amLODipine (NORVASC) 10 MG tablet TAKE 1 TABLET (10 MG TOTAL) BY MOUTH DAILY. FOR HIGH BLOOD PRESSURE 90 tablet 1  . amoxicillin-clavulanate (AUGMENTIN) 500-125 MG tablet Take 1 tablet (500 mg total) by mouth 2 (two) times daily for 10 days. 20 tablet 0  . atorvastatin (LIPITOR) 20 MG tablet TAKE 1 TABLET BY MOUTH EVERY DAY 90 tablet 1  . carvedilol (COREG) 25 MG  tablet TAKE 1 TABLET BY MOUTH TWICE A DAY WITH A MEAL 180 tablet 1  . clopidogrel (PLAVIX) 75 MG tablet TAKE ONE TABLET BY MOUTH ONCE DAILY 90 tablet 1  . doxycycline (VIBRAMYCIN) 100 MG capsule Take 1 capsule (100 mg total) by mouth 2 (two) times daily for 10 days. 20 capsule 0  . Insulin Glargine (LANTUS) 100 UNIT/ML Solostar Pen INJECT 50 UNITS UNDER THE SKIN DAILY FOR DIABETES 45 pen 1  . insulin lispro (HUMALOG KWIKPEN) 100 UNIT/ML KwikPen INJECT 6 UNITS BREAKFAST, 6 UNITS LUNCH & SUPPER 15 mL 1  . Insulin Pen Needle (B-D ULTRAFINE III SHORT PEN) 31G X 8 MM MISC Use to check blood sugar every day. Dx: 11.29; 11.65 100 each 1  . ONETOUCH VERIO test strip USE TO TEST BLOOD SUGAR THREE TIMES DAILY. DX: E11.9 300 each 3  . traMADol (ULTRAM) 50 MG tablet Take 1 tablet (50 mg total) by mouth every 6 (six) hours as needed. 15 tablet 0  . triamcinolone cream (KENALOG) 0.5 % Apply to right foot/ankle twice daily for 30 days. 30 g 1  . valsartan-hydrochlorothiazide (DIOVAN-HCT) 320-25 MG tablet TAKE 1 TABLET BY MOUTH EVERY DAY 90 tablet 1   No current facility-administered medications for this visit.     Functional Status:  In your present state of health, do you have any difficulty performing  the following activities: 02/07/2019  Hearing? N  Vision? Y  Comment follows up with Opthalmology   Difficulty concentrating or making decisions? Y  Comment forgetful sometimes   Walking or climbing stairs? N  Dressing or bathing? N  Doing errands, shopping? N  Preparing Food and eating ? N  Using the Toilet? N  In the past six months, have you accidently leaked urine? N  Do you have problems with loss of bowel control? N  Managing your Medications? N  Managing your Finances? N  Housekeeping or managing your Housekeeping? N  Some recent data might be hidden    Fall/Depression Screening: Fall Risk  09/20/2019 09/17/2019 08/10/2019  Falls in the past year? 0 0 0  Number falls in past yr: - 0 0   Comment - - -  Injury with Fall? - - 0  Risk for fall due to : - - -  Follow up - - -   PHQ 2/9 Scores 07/02/2019 02/15/2019 02/07/2019 10/12/2018 06/05/2018 02/06/2018 10/14/2017  PHQ - 2 Score 0 0 0 0 0 0 2  PHQ- 9 Score - - - - - - 3    Assessment: Patient will continue to benefit from health coach outreach for disease management and support. [facility] CM Care Plan Problem One     Most Recent Value  [facility] Long Term Goal   In 60 days the patient will decrease her a1c of 9.5 down by 0.2 points  [facility] Long Term Goal Start Date  09/20/19  Interventions for Problem One Long Term Goal  Reviewed healthy food choices for wound healing, encouraged the patient to eat before taking medications, encouraged the patient to talk with medicat staff about test result  she does not understand and follow up on appointment.     Plan: RN Health Coach will contact patient in the month of November and patient agrees to next outreach.    Lazaro Arms RN, BSN, [place] Direct Dial:  [phone]  Fax: [phone]

## 2019-09-20 NOTE — Progress Notes (Addendum)
Subjective:  Patient ID: Monique Cabrera, female    DOB: 1935/06/05,  MRN: [id number]  Chief Complaint  Patient presents with  . Foot Injury    Pt states left foot fracture is improving, states pain is less than previous visit, no concerns.  . Wound Check    Pt states right foot interdigital 4/5th ulcer is painful. Pt denies drainage. Denies fever/chills/nausea/vomiting.    84 y.o. female presents with the above complaint. Hx as above.    Review of Systems: Negative except as noted in the HPI. Denies N/V/F/Ch.  Past Medical History:  Diagnosis Date  . Acute upper respiratory infections of unspecified site   . Anemia   . Anemia, unspecified   . Atherosclerosis of native arteries of the extremities, unspecified   . Chest pain, unspecified   . Chronic kidney disease (CKD), stage II (mild)   . Diabetes mellitus   . Diarrhea   . Disorder of bone and cartilage, unspecified   . DM (diabetes mellitus) type II controlled with renal manifestation ([place])   . Herpes zoster with other nervous system complications(053.19)   . Hypercalcemia   . Hypertension   . Hypertensive renal disease, benign   . Nonspecific reaction to tuberculin skin test without active tuberculosis(795.51)   . Nonspecific tuberculin test reaction   . Other and unspecified hyperlipidemia   . Pain in joint, lower leg   . Peripheral arterial disease ([place])   . Postherpetic neuralgia   . Proteinuria   . Stroke ([place]) 01/2017  . Type II or unspecified type diabetes mellitus with renal manifestations, not stated as uncontrolled(250.40)   . Type II or unspecified type diabetes mellitus with renal manifestations, uncontrolled(250.42)   . Unspecified disorder of kidney and ureter   . Unspecified essential hypertension     Current Outpatient Medications:  .  amLODipine (NORVASC) 10 MG tablet, TAKE 1 TABLET (10 MG TOTAL) BY MOUTH DAILY. FOR HIGH BLOOD PRESSURE, Disp: 90 tablet, Rfl: 1 .  amoxicillin-clavulanate (AUGMENTIN)  500-125 MG tablet, Take 1 tablet (500 mg total) by mouth 2 (two) times daily for 10 days., Disp: 20 tablet, Rfl: 0 .  atorvastatin (LIPITOR) 20 MG tablet, TAKE 1 TABLET BY MOUTH EVERY DAY, Disp: 90 tablet, Rfl: 1 .  carvedilol (COREG) 25 MG tablet, TAKE 1 TABLET BY MOUTH TWICE A DAY WITH A MEAL, Disp: 180 tablet, Rfl: 1 .  clopidogrel (PLAVIX) 75 MG tablet, TAKE ONE TABLET BY MOUTH ONCE DAILY, Disp: 90 tablet, Rfl: 1 .  doxycycline (VIBRAMYCIN) 100 MG capsule, Take 1 capsule (100 mg total) by mouth 2 (two) times daily for 10 days., Disp: 20 capsule, Rfl: 0 .  Insulin Glargine (LANTUS) 100 UNIT/ML Solostar Pen, INJECT 50 UNITS UNDER THE SKIN DAILY FOR DIABETES, Disp: 45 pen, Rfl: 1 .  insulin lispro (HUMALOG KWIKPEN) 100 UNIT/ML KwikPen, INJECT 6 UNITS BREAKFAST, 6 UNITS LUNCH & SUPPER, Disp: 15 mL, Rfl: 1 .  Insulin Pen Needle (B-D ULTRAFINE III SHORT PEN) 31G X 8 MM MISC, Use to check blood sugar every day. Dx: 11.29; 11.65, Disp: 100 each, Rfl: 1 .  ONETOUCH VERIO test strip, USE TO TEST BLOOD SUGAR THREE TIMES DAILY. DX: E11.9, Disp: 300 each, Rfl: 3 .  traMADol (ULTRAM) 50 MG tablet, Take 1 tablet (50 mg total) by mouth every 6 (six) hours as needed., Disp: 15 tablet, Rfl: 0 .  triamcinolone cream (KENALOG) 0.5 %, Apply to right foot/ankle twice daily for 30 days., Disp: 30 g, Rfl: 1 .  valsartan-hydrochlorothiazide (DIOVAN-HCT) 320-25 MG tablet, TAKE 1 TABLET BY MOUTH EVERY DAY, Disp: 90 tablet, Rfl: 1  Social History   Tobacco Use  Smoking Status Former Smoker  . Types: Cigarettes  Smokeless Tobacco Never Used  Tobacco Comment   Quit about age 77     Allergies  Allergen Reactions  . Invokana [Canagliflozin] Other (See Comments)    Vagina itching and swelling Vaginal Itching and irritation   . Jardiance [Empagliflozin] Itching and Other (See Comments)    Vaginal itching and swelling   Objective:   Vitals:   09/06/19 1353  Temp: (!) 97.4 F (36.3 C)   There is no height or  weight on file to calculate BMI. Constitutional Well developed. Well nourished.  Vascular Dorsalis pedis pulses palpable bilaterally. Posterior tibial pulses palpable bilaterally. Capillary refill normal to all digits.  No cyanosis or clubbing noted. Pedal hair growth normal.  Neurologic Normal speech. Oriented to person, place, and time. Epicritic sensation to light touch grossly present bilaterally.  Dermatologic Nails normal Skin healing ulcer 4th interdigital space no probe to bone no warmth erythema no signs of acute infection.  Orthopedic: Normal joint ROM without pain or crepitus bilaterally. No visible deformities. No pain on palpation left 1st metatarsal   Radiographs: Taken and reviewed interval healing noted. Assessment:   1. Closed displaced fracture of first metatarsal bone of left foot with routine healing, subsequent encounter   2. Diabetic ulcer of right foot associated with diabetes mellitus due to underlying condition, limited to breakdown of skin, unspecified part of foot ([place])    Plan:  Patient was evaluated and treated and all questions answered.  Left 1st Metatarsal Fracture -XR as above -Continue immobilization -Unna boot applied.  Right 4th interdigital ulcer -continue iodosorb. No signs of acute infection -Sx shoe dispensed.  No follow-ups on file.

## 2019-09-26 ENCOUNTER — Ambulatory Visit (INDEPENDENT_AMBULATORY_CARE_PROVIDER_SITE_OTHER): Payer: Medicare Other | Admitting: Podiatry

## 2019-09-26 ENCOUNTER — Ambulatory Visit (INDEPENDENT_AMBULATORY_CARE_PROVIDER_SITE_OTHER): Payer: Medicare Other

## 2019-09-26 ENCOUNTER — Other Ambulatory Visit: Payer: Self-pay

## 2019-09-26 ENCOUNTER — Encounter: Payer: Self-pay | Admitting: Podiatry

## 2019-09-26 VITALS — Temp 97.0°F

## 2019-09-26 DIAGNOSIS — S92312D Displaced fracture of first metatarsal bone, left foot, subsequent encounter for fracture with routine healing: Secondary | ICD-10-CM | POA: Diagnosis not present

## 2019-09-26 DIAGNOSIS — L97511 Non-pressure chronic ulcer of other part of right foot limited to breakdown of skin: Secondary | ICD-10-CM | POA: Diagnosis not present

## 2019-09-26 DIAGNOSIS — E08621 Diabetes mellitus due to underlying condition with foot ulcer: Secondary | ICD-10-CM

## 2019-09-26 NOTE — Progress Notes (Signed)
Subjective:   Ms.  Monique Cabrera presents for continued care of ulceration of 4th webspace right foot.  Patient states she was seen in ER and prescription was given for antibiotics. She states she has also been seen in Geriatric clinic and was given referral for [place]. She states she was instructed to clean wound daily with antibacterial wound cleanser. She states she has an appointment with the [facility] on this Monday.  She completed a 7 day course of Keflex 500 mg prescribed by me on 08/29/2019.  She states she has completed prescribed antibiotics given by [facility] ED which was Augmentin 500/125 mg and Doxycycline 100 mg.   Today, she notes improvement in pain and decrease in swelling of foot. Pt. denies any new complaints.  Patient denies any fever, chills, nightsweats, nausea or vomiting.  Current Outpatient Medications on File Prior to Visit  Medication Sig Dispense Refill  . amLODipine (NORVASC) 10 MG tablet TAKE 1 TABLET (10 MG TOTAL) BY MOUTH DAILY. FOR HIGH BLOOD PRESSURE 90 tablet 1  . atorvastatin (LIPITOR) 20 MG tablet TAKE 1 TABLET BY MOUTH EVERY DAY 90 tablet 1  . carvedilol (COREG) 25 MG tablet TAKE 1 TABLET BY MOUTH TWICE A DAY WITH A MEAL 180 tablet 1  . clopidogrel (PLAVIX) 75 MG tablet TAKE ONE TABLET BY MOUTH ONCE DAILY 90 tablet 1  . Insulin Glargine (LANTUS) 100 UNIT/ML Solostar Pen INJECT 50 UNITS UNDER THE SKIN DAILY FOR DIABETES 45 pen 1  . insulin lispro (HUMALOG KWIKPEN) 100 UNIT/ML KwikPen INJECT 6 UNITS BREAKFAST, 6 UNITS LUNCH & SUPPER 15 mL 1  . Insulin Pen Needle (B-D ULTRAFINE III SHORT PEN) 31G X 8 MM MISC Use to check blood sugar every day. Dx: 11.29; 11.65 100 each 1  . ONETOUCH VERIO test strip USE TO TEST BLOOD SUGAR THREE TIMES DAILY. DX: E11.9 300 each 3  . traMADol (ULTRAM) 50 MG tablet Take 1 tablet (50 mg total) by mouth every 6 (six) hours as needed. 15 tablet 0  . triamcinolone cream (KENALOG) 0.5 % Apply to right foot/ankle twice  daily for 30 days. 30 g 1  . valsartan-hydrochlorothiazide (DIOVAN-HCT) 320-25 MG tablet TAKE 1 TABLET BY MOUTH EVERY DAY 90 tablet 1   No current facility-administered medications on file prior to visit.      Allergies  Allergen Reactions  . Invokana [Canagliflozin] Other (See Comments)    Vagina itching and swelling Vaginal Itching and irritation   . Jardiance [Empagliflozin] Itching and Other (See Comments)    Vaginal itching and swelling     Objective:   Vascular Examination: Vitals:   09/26/19 0853  Temp: (!) 97 F (36.1 C)    Capillary refill time delayed x 10 digits.  Dorsalis pedis and Posterior tibial pulses nonpalpable b/l..  Digital hair absent x 10 digits.  Skin temperature gradient WNL b/l  Dermatological Examination: Skin thin, shiny and atrophic b/l.  Right foot with peeling skin dorsally and skin lines indicating decrease in swelling. Open wound noted 4th webspace with serous drainage. No odor, no edema, no erythema, no purulence expressed.  No tracking, no tunneling. No probing to bone. Tenderness when 5th digit manipulated.   Toenails 1-5 b/l recently debrided.  Musculoskeletal: HAV with bunion deformity b/l.  Decreased edema left foot at fracture site.  Neurological: Sensation decreased bilaterally with 10 gram monofilament  Xrays: Right foot xray: No gas in tissues right foot. +Calcified vessels. +Hammertoe deformity lesser digits.  Left foot: Fracture  with healing noted on lateral view. +Calcified vessels.  Assessment:   1.  Diabetic Ulceration right 4th webspace, noninfected 2. 1st metatarsal shaft fracture left foot 2.    NIDDM with peripheral neuropathy  Plan: 1. Ulcer was cleansed with wound cleanser. Wound packed with Iodosorb Gel and Prisma and light dressing applied. She was given dressing supplies to last her until she is seen in [facility] on Monday. She was given written wound care instructions she is to perform  once daily. I have explained to her Wound Care will take over her care as this is their specialty. She related understanding. She is afebrile and notes improvement in pain and appearance of foot, so we will defer any more antibiotics at this time. Iodosorb Gel has antibacterial properties. 2. I have advised her to take her oral temp twice daily.  3. Xrays b/l taken and reviewed with Ms. Hamstra.   4. Continue surgical shoe right foot daily. 5. Patient was given instructions on  dressing change/aftercare and was instructed to call immediately if any signs or symptoms of infection arise.  6. Patient is to follow up with [place] in 3 weeks for left foot xray. 7. Patient instructed to report to emergency department with worsening appearance of ulcer/toe/foot, increased pain, foul odor, increased redness, swelling, drainage, fever, chills, nightsweats, nausea, vomiting, increased blood sugar.  8. Patient related understanding. All questions/concerns of patient answered/addressed during the visit. She has written dressing changes and was instructed to take her wound care products to her [place] appointment so they are aware of treatment prescribed on today.

## 2019-09-26 NOTE — Patient Instructions (Signed)
DRESSING CHANGES RIGHT FOOT:  WEAR SURGICAL SHOE AT ALL TIMES   PERFORM DRESSING CHANGES DAILY UNTIL SEEN BY WOUND CARE. THEY WILL TAKE OVER CARE OF YOUR WOUND.  1. KEEP RIGHT  FOOT DRY AT ALL TIMES!!!!  2. CLEANSE ULCER WITH SALINE.  3. DAB DRY WITH GAUZE SPONGE.  4. APPLY A LIGHT AMOUNT OF IODOSORB GEL TO PRISMA AND PLACE IN BASE OF ULCER.  5. APPLY OUTER DRESSING/BAND-AID AS INSTRUCTED.  6. WEAR SURGICAL SHOE DAILY AT ALL TIMES.  7. DO NOT WALK BAREFOOT!!!   8.  CHECK YOUR TEMPERATURE TWICE DAILY. IF YOU EXPERIENCE ANY FEVER, CHILLS, NIGHTSWEATS, NAUSEA OR VOMITING, ELEVATED OR LOW BLOOD SUGARS, REPORT TO EMERGENCY ROOM.  9. IF YOU EXPERIENCE INCREASED REDNESS, PAIN, SWELLING, DISCOLORATION, ODOR, PUS, DRAINAGE OR WARMTH OF YOUR FOOT, REPORT TO EMERGENCY ROOM.

## 2019-09-27 ENCOUNTER — Ambulatory Visit (INDEPENDENT_AMBULATORY_CARE_PROVIDER_SITE_OTHER): Payer: Medicare Other | Admitting: Family

## 2019-09-27 ENCOUNTER — Encounter: Payer: Self-pay | Admitting: Family

## 2019-09-27 VITALS — BP 98/60 | HR 74 | Temp 96.8°F | Ht 65.0 in | Wt 138.6 lb

## 2019-09-27 DIAGNOSIS — S81801D Unspecified open wound, right lower leg, subsequent encounter: Secondary | ICD-10-CM | POA: Diagnosis not present

## 2019-09-27 NOTE — Progress Notes (Signed)
Provider: Mosetta Ferdinand FNP-C  [name], DO  Patient Care Team: [name], DO as PCP - General (Geriatric Medicine) Adrian Prows, MD as Consulting Physician (Cardiology) Lazaro Arms, RN as [facility] Management Rutherford Guys, MD as Consulting Physician (Ophthalmology) Justin Mend, MD as Attending Physician (Internal Medicine)  Extended Emergency Contact Information Primary Emergency Contact: IWPYKDX,IPJA Address: [address]          [place], [place] [address] Johnnette Litter of [place] Phone: [phone] Relation: Daughter Secondary Emergency Contact: Kimberlee Nearing [place] of [place] Phone: [phone] Relation: Daughter  Code Status:  DNR Goals of care: Advanced Directive information Advanced Directives 09/11/2019  Does Patient Have a Medical Advance Directive? No  Type of Advance Directive -  Does patient want to make changes to medical advance directive? -  Would patient like information on creating a medical advance directive? -  Pre-existing out of facility DNR order (yellow form or pink MOST form) -     Chief Complaint  Patient presents with  . Acute Visit    Absess wound on right foot patient states 2 week duration patient states she has been to foot doctor yesterday and was prescribed some cream to apply to wound     HPI:  Pt is a 84 y.o. female seen today for an acute visit for follow up right leg wound.she states saw the podiatrist yesterday who changed the dressing on the right foot.she states pain and swelling on the foot has improved.she was directed to cleanse wound with saline,pack with Iodosorb gel and prisma and light dressing.she was was given supplies to last until seen by wound care center 10/01/2019.she states checks her temperature as directed no fever or chills.she wears surgical shoe on the right foot and has a boot on the left leg due to fracture.she continues to follow up with Orthopedic.has follow up with  Podiatrist in 3 weeks for left foot X-ray.she has completed course of antibiotics Keflex,Augmentin and doxycycline prescribed 09/11/2019 during her ED visit. Home health Nurse was ordered when  I saw 09/17/2019 still awaiting visit due to shortage of HHN during COVID-19 per [name] referral coordinator.   Reports CBG readings 160 after breakfast today.    Past Medical History:  Diagnosis Date  . Acute upper respiratory infections of unspecified site   . Anemia   . Anemia, unspecified   . Atherosclerosis of native arteries of the extremities, unspecified   . Chest pain, unspecified   . Chronic kidney disease (CKD), stage II (mild)   . Diabetes mellitus   . Diarrhea   . Disorder of bone and cartilage, unspecified   . DM (diabetes mellitus) type II controlled with renal manifestation ([place])   . Herpes zoster with other nervous system complications(053.19)   . Hypercalcemia   . Hypertension   . Hypertensive renal disease, benign   . Nonspecific reaction to tuberculin skin test without active tuberculosis(795.51)   . Nonspecific tuberculin test reaction   . Other and unspecified hyperlipidemia   . Pain in joint, lower leg   . Peripheral arterial disease ([place])   . Postherpetic neuralgia   . Proteinuria   . Stroke ([place]) 01/2017  . Type II or unspecified type diabetes mellitus with renal manifestations, not stated as uncontrolled(250.40)   . Type II or unspecified type diabetes mellitus with renal manifestations, uncontrolled(250.42)   . Unspecified disorder of kidney and ureter   . Unspecified essential hypertension    Past Surgical History:  Procedure Laterality Date  . hysterectomy    . INCISION AND DRAINAGE Left 05/27/14   sebacous cyst, ear  . PRP Left    Dr. Ricki Miller  . removal of cyst from hand    . removal of tumor from foot    . TONSILLECTOMY      Allergies  Allergen Reactions  . Invokana [Canagliflozin] Other (See Comments)    Vagina itching and swelling Vaginal Itching  and irritation   . Jardiance [Empagliflozin] Itching and Other (See Comments)    Vaginal itching and swelling    Outpatient Encounter Medications as of 09/27/2019  Medication Sig  . amLODipine (NORVASC) 10 MG tablet TAKE 1 TABLET (10 MG TOTAL) BY MOUTH DAILY. FOR HIGH BLOOD PRESSURE  . atorvastatin (LIPITOR) 20 MG tablet TAKE 1 TABLET BY MOUTH EVERY DAY  . carvedilol (COREG) 25 MG tablet TAKE 1 TABLET BY MOUTH TWICE A DAY WITH A MEAL  . clopidogrel (PLAVIX) 75 MG tablet TAKE ONE TABLET BY MOUTH ONCE DAILY  . Insulin Glargine (LANTUS) 100 UNIT/ML Solostar Pen INJECT 50 UNITS UNDER THE SKIN DAILY FOR DIABETES  . insulin lispro (HUMALOG KWIKPEN) 100 UNIT/ML KwikPen INJECT 6 UNITS BREAKFAST, 6 UNITS LUNCH & SUPPER  . Insulin Pen Needle (B-D ULTRAFINE III SHORT PEN) 31G X 8 MM MISC Use to check blood sugar every day. Dx: 11.29; 11.65  . ONETOUCH VERIO test strip USE TO TEST BLOOD SUGAR THREE TIMES DAILY. DX: E11.9  . traMADol (ULTRAM) 50 MG tablet Take 1 tablet (50 mg total) by mouth every 6 (six) hours as needed.  . triamcinolone cream (KENALOG) 0.5 % Apply to right foot/ankle twice daily for 30 days.  . valsartan-hydrochlorothiazide (DIOVAN-HCT) 320-25 MG tablet TAKE 1 TABLET BY MOUTH EVERY DAY   No facility-administered encounter medications on file as of 09/27/2019.     Review of Systems  Constitutional: Negative for activity change, appetite change, chills, fatigue and fever.  HENT: Negative for rhinorrhea, sinus pressure, sinus pain, sneezing and sore throat.   Respiratory: Negative for cough, chest tightness, shortness of breath and wheezing.   Cardiovascular: Negative for chest pain, palpitations and leg swelling.  Gastrointestinal: Negative for abdominal distention, abdominal pain, constipation, diarrhea, nausea and vomiting.  Musculoskeletal: Positive for arthralgias and gait problem.  Skin: Positive for wound. Negative for color change, pallor and rash.       Right foot wound per  HPI   Neurological: Negative for dizziness, weakness, light-headedness, numbness and headaches.  Psychiatric/Behavioral: Negative for confusion and sleep disturbance. The patient is not nervous/anxious.     Immunization History  Administered Date(s) Administered  . Fluad Quad(high Dose 65+) 08/23/2019  . Influenza, High Dose Seasonal PF 09/30/2018  . Influenza,inj,Quad PF,6+ Mos 09/24/2013, 09/19/2014, 09/13/2016  . Influenza-Unspecified 11/22/2009, 10/01/2011, 09/10/2015, 09/26/2017  . PPD Test 01/12/2010  . Pneumococcal Conjugate-13 11/24/2015  . Pneumococcal Polysaccharide-23 12/27/2005   Pertinent  Health Maintenance Due  Topic Date Due  . OPHTHALMOLOGY EXAM  01/26/2019  . FOOT EXAM  02/06/2019  . HEMOGLOBIN A1C  12/28/2019  . INFLUENZA VACCINE  Completed  . DEXA SCAN  Completed  . PNA vac Low Risk Adult  Completed   Fall Risk  09/27/2019 09/20/2019 09/17/2019 08/10/2019 07/19/2019  Falls in the past year? 0 0 0 0 0  Number falls in past yr: 0 - 0 0 -  Comment - - - - -  Injury with Fall? 0 - - 0 -  Risk for fall due to : - - - - -  Follow up - - - - -    Vitals:   09/27/19 1008  BP: 98/60  Pulse: 74  Temp: (!) 96.8 F (36 C)  TempSrc: Temporal  SpO2: 99%  Weight: 138 lb 9.6 oz (62.9 kg)  Height: 5\' 5"  (1.651 m)   Body mass index is 23.06 kg/m. Physical Exam Vitals signs reviewed.  Constitutional:      General: She is not in acute distress.    Appearance: She is normal weight. She is not ill-appearing or diaphoretic.  HENT:     Mouth/Throat:     Mouth: Mucous membranes are moist.     Pharynx: Oropharynx is clear. No oropharyngeal exudate or posterior oropharyngeal erythema.  Eyes:     General: No scleral icterus.       Right eye: No discharge.        Left eye: No discharge.     Extraocular Movements: Extraocular movements intact.     Conjunctiva/sclera: Conjunctivae normal.     Pupils: Pupils are equal, round, and reactive to light.     Comments: Eye  glasses in place   Cardiovascular:     Rate and Rhythm: Normal rate and regular rhythm.     Heart sounds: Normal heart sounds. No murmur. No friction rub. No gallop.   Pulmonary:     Effort: Pulmonary effort is normal. No respiratory distress.     Breath sounds: Normal breath sounds. No wheezing, rhonchi or rales.  Chest:     Chest wall: No tenderness.  Abdominal:     General: Bowel sounds are normal. There is no distension.     Palpations: Abdomen is soft. There is no mass.     Tenderness: There is no abdominal tenderness. There is no right CVA tenderness, left CVA tenderness, guarding or rebound.  Musculoskeletal:        General: No swelling or tenderness.     Right lower leg: No edema.     Left lower leg: No edema.     Comments: Unsteady gait walks with bilateral crutches.   Skin:    General: Skin is warm and dry.     Coloration: Skin is not pale.     Findings: No bruising, erythema or rash.     Comments: Right foot 4th and 5th webspace wound with serous drainage,no odor or swelling noted.dorsal foot tender to palpation and movement of 5 th toe though much better compared to previous visit.skin peeling on top of the foot with some black areas.   Neurological:     Mental Status: She is alert and oriented to person, place, and time.     Cranial Nerves: No cranial nerve deficit.     Sensory: Sensory deficit present.     Motor: No weakness.     Coordination: Coordination normal.     Gait: Gait abnormal.     Comments: Decreased sensation filament prick to foot noted.  Psychiatric:        Mood and Affect: Mood normal.        Behavior: Behavior normal.        Thought Content: Thought content normal.        Judgment: Judgment normal.    Labs reviewed: Recent Labs    06/27/19 0835 09/11/19 1136 09/17/19 1518  NA 139 136 134*  K 3.9 3.9 3.8  CL 108 104 98  CO2 21 20* 24  GLUCOSE 136* 135* 226*  BUN 36* 32* 38*  CREATININE 1.60* 1.76* 1.71*  CALCIUM 9.9 10.3  10.6*    Recent Labs    06/27/19 0835 09/11/19 1136 09/17/19 1518  AST 20 18 13   ALT 20 13 8   ALKPHOS  --  59  --   BILITOT 0.5 0.6 0.4  PROT 6.5 6.8 6.7  ALBUMIN  --  3.4*  --    Recent Labs    06/27/19 0835 09/11/19 1136 09/17/19 1518  WBC 7.0 9.5 12.1*  NEUTROABS 4,130 5.8 7,974*  HGB 11.4* 10.9* 10.6*  HCT 33.4* 33.2* 31.4*  MCV 91.8 95.1 91.8  PLT 231 275 366   No results found for: TSH Lab Results  Component Value Date   HGBA1C 9.5 (H) 06/27/2019   Lab Results  Component Value Date   CHOL 121 02/07/2019   HDL 48 (L) 02/07/2019   LDLCALC 60 02/07/2019   TRIG 53 02/07/2019   CHOLHDL 2.5 02/07/2019    Significant Diagnostic Results in last 30 days:  Dg Chest 2 View  Result Date: 09/11/2019 CLINICAL DATA:  Infection. EXAM: CHEST - 2 VIEW COMPARISON:  Radiographs of February 17, 2017. FINDINGS: The heart size and mediastinal contours are within normal limits. Both lungs are clear. Atherosclerosis of thoracic aorta is noted. The visualized skeletal structures are unremarkable. IMPRESSION: No active cardiopulmonary disease. Aortic Atherosclerosis (ICD10-I70.0). Electronically Signed   By: Marijo Conception M.D.   On: 09/11/2019 12:13   Dg Foot Complete Left  Result Date: 09/26/2019 Please see detailed radiograph report in office note.  Dg Foot Complete Left  Result Date: 09/06/2019 Please see detailed radiograph report in office note.  Dg Foot Complete Left  Result Date: 08/29/2019 Please see detailed radiograph report in office note.  Dg Foot Complete Right  Result Date: 09/26/2019 Please see detailed radiograph report in office note.  Dg Foot Complete Right  Result Date: 09/11/2019 CLINICAL DATA:  Infection. EXAM: RIGHT FOOT COMPLETE - 3+ VIEW COMPARISON:  09/06/2019. FINDINGS: Diffuse osteopenia and degenerative change. Old healed angulated fracture of the distal right fifth metatarsal noted. No acute bony abnormality identified. No bony erosions. No soft tissue  foreign bodies. Peripheral vascular calcification. IMPRESSION: 1. Diffuse osteopenia in degenerative change. Old left fifth metatarsal fracture. No acute bony abnormality identified. No bony erosions. No soft tissue foreign body. 2.  Peripheral vascular disease. Electronically Signed   By: Marcello Moores  Register   On: 09/11/2019 12:14   Dg Foot Complete Right  Result Date: 08/29/2019 Please see detailed radiograph report in office note.   Assessment/Plan   Wound of right lower extremity, subsequent encounter Afebrile.Pain and swelling has improved.Has completed oral antibiotics as directed.continue with wound dressing change daily as directed by Podiatrist cleanse with saline,pack with Iodosorb gel and Prisma and band aid.check temperature twice daily and notify provider if running any fever or go to the ED.continue to wear surgical shoe.Keep appointment with [facility] 10/01/2019.   Family/ staff Communication: Reviewed plan of care with patient.  Labs/tests ordered: None   Sani Madariaga C Mattheu Brodersen, NP

## 2019-10-01 ENCOUNTER — Ambulatory Visit: Payer: Medicare Other | Admitting: Family

## 2019-10-01 ENCOUNTER — Encounter (HOSPITAL_BASED_OUTPATIENT_CLINIC_OR_DEPARTMENT_OTHER): Payer: Medicare Other | Attending: Internal Medicine | Admitting: Internal Medicine

## 2019-10-01 ENCOUNTER — Other Ambulatory Visit: Payer: Self-pay

## 2019-10-01 ENCOUNTER — Other Ambulatory Visit (HOSPITAL_COMMUNITY)
Admission: RE | Admit: 2019-10-01 | Discharge: 2019-10-01 | Disposition: A | Discharge status: Home / Self Care | Payer: Medicare Other | Source: Other Acute Inpatient Hospital | Attending: Internal Medicine | Admitting: Internal Medicine

## 2019-10-01 DIAGNOSIS — L97512 Non-pressure chronic ulcer of other part of right foot with fat layer exposed: Secondary | ICD-10-CM | POA: Insufficient documentation

## 2019-10-01 DIAGNOSIS — L97519 Non-pressure chronic ulcer of other part of right foot with unspecified severity: Secondary | ICD-10-CM | POA: Insufficient documentation

## 2019-10-01 DIAGNOSIS — E11319 Type 2 diabetes mellitus with unspecified diabetic retinopathy without macular edema: Secondary | ICD-10-CM | POA: Insufficient documentation

## 2019-10-01 DIAGNOSIS — E1151 Type 2 diabetes mellitus with diabetic peripheral angiopathy without gangrene: Secondary | ICD-10-CM | POA: Diagnosis not present

## 2019-10-01 DIAGNOSIS — E1140 Type 2 diabetes mellitus with diabetic neuropathy, unspecified: Secondary | ICD-10-CM | POA: Diagnosis not present

## 2019-10-01 DIAGNOSIS — E11621 Type 2 diabetes mellitus with foot ulcer: Secondary | ICD-10-CM | POA: Insufficient documentation

## 2019-10-01 DIAGNOSIS — E1122 Type 2 diabetes mellitus with diabetic chronic kidney disease: Secondary | ICD-10-CM | POA: Diagnosis not present

## 2019-10-01 DIAGNOSIS — N182 Chronic kidney disease, stage 2 (mild): Secondary | ICD-10-CM | POA: Insufficient documentation

## 2019-10-01 DIAGNOSIS — B9689 Other specified bacterial agents as the cause of diseases classified elsewhere: Secondary | ICD-10-CM | POA: Insufficient documentation

## 2019-10-01 DIAGNOSIS — Z794 Long term (current) use of insulin: Secondary | ICD-10-CM | POA: Diagnosis not present

## 2019-10-01 DIAGNOSIS — E119 Type 2 diabetes mellitus without complications: Secondary | ICD-10-CM | POA: Diagnosis not present

## 2019-10-01 DIAGNOSIS — I129 Hypertensive chronic kidney disease with stage 1 through stage 4 chronic kidney disease, or unspecified chronic kidney disease: Secondary | ICD-10-CM | POA: Insufficient documentation

## 2019-10-01 LAB — GLUCOSE, CAPILLARY
Glucose-Capillary: 52 mg/dL — ABNORMAL LOW (ref 70–99)
Glucose-Capillary: 41 mg/dL — CL (ref 70–99)
Glucose-Capillary: 66 mg/dL — ABNORMAL LOW (ref 70–99)

## 2019-10-02 NOTE — Progress Notes (Signed)
Monique Cabrera, Monique Cabrera ([id number]) Visit Report for 10/01/2019 Abuse/Suicide Risk Screen Details Patient Name: Date of Service: Monique Cabrera, Monique Cabrera 10/01/2019 10:30 AM Medical Record Patient Account Number: [id number] Number: Treating RN: Baruch Gouty Date of Birth/Sex: 11-05-1935 (84 y.o. Female) Other Clinician: Primary Care Giovannina Mun: Hollace Kinnier Treating Idan Prime/Extender:Robson, Legrand Como Referring Luccia Reinheimer: Bevely Palmer in Treatment: 0 Abuse/Suicide Risk Screen Items Answer ABUSE RISK SCREEN: Has anyone close to you tried to hurt or harm you recentlyo No Do you feel uncomfortable with anyone in your familyo No Has anyone forced you do things that you didnt want to doo No Electronic Signature(s) Signed: 10/02/2019 5:55:43 PM By: Baruch Gouty RN, BSN Entered By: Baruch Gouty on 10/01/2019 10:26:14 -------------------------------------------------------------------------------- Activities of Daily Living Details Patient Name: Date of Service: Monique Cabrera 10/01/2019 10:30 AM Medical Record Patient Account Number: [id number] Number: Treating RN: Baruch Gouty Date of Birth/Sex: [birth year] (84 y.o. Female) Other Clinician: Primary Care Geriann Lafont: Hollace Kinnier Treating Perl Kerney/Extender:Robson, Legrand Como Referring Karizma Cheek: Bevely Palmer in Treatment: 0 Activities of Daily Living Items Answer Activities of Daily Living (Please select one for each item) Drive Automobile Not Able Take Medications Completely Able Use Telephone Completely Able Care for Appearance Completely Able Use Toilet Completely Able Bath / Shower Completely Able Dress Self Completely Able Feed Self Completely Able Walk Completely Able Get In / Out Bed Completely Able Housework Completely Able Prepare Meals Completely [place] Completely Able Shop for Self Completely Able Electronic Signature(s) Signed: 10/02/2019 5:55:43 PM By: Baruch Gouty RN,  BSN Entered By: Baruch Gouty on 10/01/2019 10:26:40 -------------------------------------------------------------------------------- Education Screening Details Patient Name: Date of Service: Monique Cabrera 10/01/2019 10:30 AM Medical Record Patient Account Number: [id number] Number: Treating RN: Baruch Gouty Date of Birth/Sex: 04-07-1935 (84 y.o. Female) Other Clinician: Primary Care Emilija Bohman: Hollace Kinnier Treating Tanisha Lutes/Extender:Robson, Legrand Como Referring Jasneet Schobert: Bevely Palmer in Treatment: 0 Primary Learner Assessed: Patient Learning Preferences/Education Level/Primary Language Learning Preference: Explanation, Demonstration, Printed Material Highest Education Level: College or Above Preferred Language: English Cognitive Barrier Language Barrier: No Translator Needed: No Memory Deficit: No Emotional Barrier: No Cultural/Religious Beliefs Affecting Medical Care: No Physical Barrier Impaired Vision: Yes Glasses Impaired Hearing: No Decreased Hand dexterity: No Knowledge/Comprehension Knowledge Level: High Comprehension Level: High Ability to understand written High instructions: Ability to understand verbal High instructions: Motivation Anxiety Level: Calm Cooperation: Cooperative Education Importance: Acknowledges Need Interest in Health Problems: Asks Questions Perception: Coherent Willingness to Engage in Self- High Management Activities: Readiness to Engage in Self- High Management Activities: Electronic Signature(s) Signed: 10/02/2019 5:55:43 PM By: Baruch Gouty RN, BSN Entered By: Baruch Gouty on 10/01/2019 10:27:16 -------------------------------------------------------------------------------- Fall Risk Assessment Details Patient Name: Date of Service: Monique Cabrera. 10/01/2019 10:30 AM Medical Record Patient Account Number: [id number] Number: Treating RN: Baruch Gouty Date of Birth/Sex: 1934-12-28  (84 y.o. Female) Other Clinician: Primary Care Jonell Brumbaugh: Hollace Kinnier Treating Audrey Thull/Extender:Robson, Legrand Como Referring Wynter Grave: Bevely Palmer in Treatment: 0 Fall Risk Assessment Items Have you had 2 or more falls in the last 12 monthso 0 No Have you had any fall that resulted in injury in the last 12 monthso 0 No FALLS RISK SCREEN History of falling - immediate or within 3 months 25 Yes Secondary diagnosis (Do you have 2 or more medical diagnoseso) 0 No Ambulatory aid None/bed rest/wheelchair/nurse 0 No Crutches/cane/walker 15 Yes Furniture 0 No Intravenous therapy Access/Saline/Heparin Lock 0 No Gait/Transferring Normal/ bed rest/ wheelchair 0 No Impaired (short steps with shuffle, may have difficulty arising from chair,  0 No head down, impaired balance) Mental Status Oriented to own ability 0 Yes Overestimates or forgets limitations 0 No Risk Level: Medium Risk Score: 50 Electronic Signature(s) Signed: 10/02/2019 5:55:43 PM By: Baruch Gouty RN, BSN Entered By: Baruch Gouty on 10/01/2019 10:27:38 -------------------------------------------------------------------------------- Foot Assessment Details Patient Name: Date of Service: Monique Cabrera 10/01/2019 10:30 AM Medical Record Patient Account Number: [id number] Number: Treating RN: Baruch Gouty Date of Birth/Sex: 02-09-1935 (84 y.o. Female) Other Clinician: Primary Care Jeronimo Hellberg: Hollace Kinnier Treating Rayan Dyal/Extender:Robson, Legrand Como Referring Kaarin Pardy: Bevely Palmer in Treatment: 0 Foot Assessment Items Site Locations + = Sensation present, - = Sensation absent, C = Callus, U = Ulcer R = Redness, W = Warmth, M = Maceration, PU = Pre-ulcerative lesion F = Fissure, S = Swelling, D = Dryness Assessment Right: Left: Other Deformity: No No Prior Foot Ulcer: No No Prior Amputation: No No Charcot Joint: No No Ambulatory Status: Ambulatory With Help Assistance Device:  Cane Gait: Steady Electronic Signature(s) Signed: 10/02/2019 5:55:43 PM By: Baruch Gouty RN, BSN Entered By: Baruch Gouty on 10/01/2019 10:31:32 -------------------------------------------------------------------------------- Nutrition Risk Screening Details Patient Name: Date of Service: Monique Cabrera 10/01/2019 10:30 AM Medical Record Patient Account Number: [id number] Number: Treating RN: Baruch Gouty Date of Birth/Sex: Apr 29, 1935 (84 y.o. Female) Other Clinician: Primary Care Hoyt Leanos: Hollace Kinnier Treating Tywanda Rice/Extender:Robson, Legrand Como Referring Verlia Kaney: Bevely Palmer in Treatment: 0 Height (in): 65 Weight (lbs): 138 Body Mass Index (BMI): 23 Nutrition Risk Screening Items Score Screening NUTRITION RISK SCREEN: I have an illness or condition that made me change the kind and/or 0 No amount of food I eat I eat fewer than two meals per day 0 No I eat few fruits and vegetables, or milk products 0 No I have three or more drinks of beer, liquor or wine almost every day 0 No I have tooth or mouth problems that make it hard for me to eat 0 No I don't always have enough money to buy the food I need 0 No I eat alone most of the time 1 Yes I take three or more different prescribed or over-the-counter drugs a day 1 Yes 0 No Without wanting to, I have lost or gained 10 pounds in the last six months I am not always physically able to shop, cook and/or feed myself 0 No Nutrition Protocols Good Risk Protocol 0 No interventions needed Moderate Risk Protocol High Risk Proctocol Risk Level: Good Risk Score: 2 Electronic Signature(s) Signed: 10/02/2019 5:55:43 PM By: Baruch Gouty RN, BSN Entered By: Baruch Gouty on 10/01/2019 10:29:21

## 2019-10-03 ENCOUNTER — Ambulatory Visit (INDEPENDENT_AMBULATORY_CARE_PROVIDER_SITE_OTHER): Payer: Medicare Other | Admitting: Family

## 2019-10-03 ENCOUNTER — Encounter: Payer: Self-pay | Admitting: Family

## 2019-10-03 ENCOUNTER — Other Ambulatory Visit: Payer: Self-pay

## 2019-10-03 DIAGNOSIS — M79671 Pain in right foot: Secondary | ICD-10-CM | POA: Diagnosis not present

## 2019-10-03 DIAGNOSIS — S81801D Unspecified open wound, right lower leg, subsequent encounter: Secondary | ICD-10-CM

## 2019-10-03 MED ORDER — ACETAMINOPHEN 500 MG PO TABS: 1000.0000 mg | ORAL_TABLET | Freq: Two times a day (BID) | ORAL | 0 refills | Status: DC

## 2019-10-03 NOTE — Progress Notes (Signed)
This service is provided via telemedicine  No vital signs collected/recorded due to the encounter was a telemedicine visit.   Location of patient (ex: home, work):  Home  Patient consents to a telephone visit:  Yes  Location of the provider (ex: office, home):  Office   Name of any referring provider:  Loma [place]   Names of all persons participating in the telemedicine service and their role in the encounter: [name] Ngetich NP, Ruthell Rummage CMA and patient   Time spent on call:  Ruthell Rummage CMA spent 8  minutes on phone with patient     [facility]  Provider: Marlowe Sax, NP   Code Status: FULL Goals of Care:  Advanced Directives 09/11/2019  Does Patient Have a Medical Advance Directive? No  Type of Advance Directive -  Does patient want to make changes to medical advance directive? -  Would patient like information on creating a medical advance directive? -  Pre-existing out of facility DNR order (yellow form or pink MOST form) -     Chief Complaint  Patient presents with  . Acute Visit    Right foot pain keeping patient up at night and discuss bs recently dropped in office   . Medication Management    patient would like to discuss tyenol pm for her pain     HPI: Patient is a 84 y.o. female seen today for an acute visit for right foot pain.Pain worsening on right foot wound keeping her up at night.she has taken extra strengthTylenol 500 mg tablet without any relief.she states was seen at the  Wound center 10/01/2019 and has upcoming appointment 10/08/2019.she contacted wound center concerning her pain but states was asked to call her PCP.she denies any fever,chills,odor or increase wound drainage.   Past Medical History:  Diagnosis Date  . Acute upper respiratory infections of unspecified site   . Anemia   . Anemia, unspecified   . Atherosclerosis of native arteries of the extremities, unspecified   . Chest pain, unspecified   . Chronic kidney disease  (CKD), stage II (mild)   . Diabetes mellitus   . Diarrhea   . Disorder of bone and cartilage, unspecified   . DM (diabetes mellitus) type II controlled with renal manifestation ([place])   . Herpes zoster with other nervous system complications(053.19)   . Hypercalcemia   . Hypertension   . Hypertensive renal disease, benign   . Nonspecific reaction to tuberculin skin test without active tuberculosis(795.51)   . Nonspecific tuberculin test reaction   . Other and unspecified hyperlipidemia   . Pain in joint, lower leg   . Peripheral arterial disease ([place])   . Postherpetic neuralgia   . Proteinuria   . Stroke ([place]) 01/2017  . Type II or unspecified type diabetes mellitus with renal manifestations, not stated as uncontrolled(250.40)   . Type II or unspecified type diabetes mellitus with renal manifestations, uncontrolled(250.42)   . Unspecified disorder of kidney and ureter   . Unspecified essential hypertension     Past Surgical History:  Procedure Laterality Date  . hysterectomy    . INCISION AND DRAINAGE Left 05/27/14   sebacous cyst, ear  . PRP Left    Dr. Ricki Miller  . removal of cyst from hand    . removal of tumor from foot    . TONSILLECTOMY      Allergies  Allergen Reactions  . Invokana [Canagliflozin] Other (See Comments)    Vagina itching and swelling Vaginal Itching and  irritation   . Jardiance [Empagliflozin] Itching and Other (See Comments)    Vaginal itching and swelling    Outpatient Encounter Medications as of 10/03/2019  Medication Sig  . amLODipine (NORVASC) 10 MG tablet TAKE 1 TABLET (10 MG TOTAL) BY MOUTH DAILY. FOR HIGH BLOOD PRESSURE  . atorvastatin (LIPITOR) 20 MG tablet TAKE 1 TABLET BY MOUTH EVERY DAY  . carvedilol (COREG) 25 MG tablet TAKE 1 TABLET BY MOUTH TWICE A DAY WITH A MEAL  . clopidogrel (PLAVIX) 75 MG tablet TAKE ONE TABLET BY MOUTH ONCE DAILY  . Insulin Glargine (LANTUS) 100 UNIT/ML Solostar Pen INJECT 50 UNITS UNDER THE SKIN DAILY FOR  DIABETES  . insulin lispro (HUMALOG KWIKPEN) 100 UNIT/ML KwikPen INJECT 6 UNITS BREAKFAST, 6 UNITS LUNCH & SUPPER  . Insulin Pen Needle (B-D ULTRAFINE III SHORT PEN) 31G X 8 MM MISC Use to check blood sugar every day. Dx: 11.29; 11.65  . ONETOUCH VERIO test strip USE TO TEST BLOOD SUGAR THREE TIMES DAILY. DX: E11.9  . valsartan-hydrochlorothiazide (DIOVAN-HCT) 320-25 MG tablet TAKE 1 TABLET BY MOUTH EVERY DAY  . [DISCONTINUED] traMADol (ULTRAM) 50 MG tablet Take 1 tablet (50 mg total) by mouth every 6 (six) hours as needed.  . [DISCONTINUED] triamcinolone cream (KENALOG) 0.5 % Apply to right foot/ankle twice daily for 30 days.   No facility-administered encounter medications on file as of 10/03/2019.     Review of Systems:  Review of Systems  Constitutional: Negative for appetite change, chills, fatigue and fever.  Respiratory: Negative for cough, chest tightness, shortness of breath and wheezing.   Cardiovascular: Negative for chest pain, palpitations and leg swelling.  Gastrointestinal: Negative for abdominal distention, abdominal pain, constipation, diarrhea, nausea and vomiting.  Musculoskeletal: Positive for arthralgias. Negative for myalgias.       Right foot wound pain  Skin: Negative for color change, pallor and rash.  Neurological: Negative for dizziness, weakness, light-headedness, numbness and headaches.  Psychiatric/Behavioral: Negative for agitation, confusion and sleep disturbance. The patient is not nervous/anxious.        Right foot pain keeping her awake at night    Health Maintenance  Topic Date Due  . OPHTHALMOLOGY EXAM  01/26/2019  . FOOT EXAM  02/06/2019  . HEMOGLOBIN A1C  12/28/2019  . INFLUENZA VACCINE  Completed  . DEXA SCAN  Completed  . PNA vac Low Risk Adult  Completed    Physical Exam: There were no vitals filed for this visit. There is no height or weight on file to calculate BMI. Physical Exam Unable to complete on telephone visit.   Labs  reviewed: Basic Metabolic Panel: Recent Labs    06/27/19 0835 09/11/19 1136 09/17/19 1518  NA 139 136 134*  K 3.9 3.9 3.8  CL 108 104 98  CO2 21 20* 24  GLUCOSE 136* 135* 226*  BUN 36* 32* 38*  CREATININE 1.60* 1.76* 1.71*  CALCIUM 9.9 10.3 10.6*   Liver Function Tests: Recent Labs    06/27/19 0835 09/11/19 1136 09/17/19 1518  AST 20 18 13   ALT 20 13 8   ALKPHOS  --  59  --   BILITOT 0.5 0.6 0.4  PROT 6.5 6.8 6.7  ALBUMIN  --  3.4*  --    No results for input(s): LIPASE, AMYLASE in the last 8760 hours. No results for input(s): AMMONIA in the last 8760 hours. CBC: Recent Labs    06/27/19 0835 09/11/19 1136 09/17/19 1518  WBC 7.0 9.5 12.1*  NEUTROABS 4,130 5.8 7,974*  HGB 11.4* 10.9* 10.6*  HCT 33.4* 33.2* 31.4*  MCV 91.8 95.1 91.8  PLT 231 275 366   Lipid Panel: Recent Labs    10/25/18 0801 02/07/19 0900  CHOL 123 121  HDL 49* 48*  LDLCALC 58 60  TRIG 81 53  CHOLHDL 2.5 2.5   Lab Results  Component Value Date   HGBA1C 9.5 (H) 06/27/2019    Procedures since last visit: Dg Chest 2 View  Result Date: 09/11/2019 CLINICAL DATA:  Infection. EXAM: CHEST - 2 VIEW COMPARISON:  Radiographs of February 17, 2017. FINDINGS: The heart size and mediastinal contours are within normal limits. Both lungs are clear. Atherosclerosis of thoracic aorta is noted. The visualized skeletal structures are unremarkable. IMPRESSION: No active cardiopulmonary disease. Aortic Atherosclerosis (ICD10-I70.0). Electronically Signed   By: Marijo Conception M.D.   On: 09/11/2019 12:13   Dg Foot Complete Left  Result Date: 09/26/2019 Please see detailed radiograph report in office note.  Dg Foot Complete Left  Result Date: 09/06/2019 Please see detailed radiograph report in office note.  Dg Foot Complete Right  Result Date: 09/26/2019 Please see detailed radiograph report in office note.  Dg Foot Complete Right  Result Date: 09/11/2019 CLINICAL DATA:  Infection. EXAM: RIGHT FOOT  COMPLETE - 3+ VIEW COMPARISON:  09/06/2019. FINDINGS: Diffuse osteopenia and degenerative change. Old healed angulated fracture of the distal right fifth metatarsal noted. No acute bony abnormality identified. No bony erosions. No soft tissue foreign bodies. Peripheral vascular calcification. IMPRESSION: 1. Diffuse osteopenia in degenerative change. Old left fifth metatarsal fracture. No acute bony abnormality identified. No bony erosions. No soft tissue foreign body. 2.  Peripheral vascular disease. Electronically Signed   By: [place]   On: 09/11/2019 12:14    Assessment/Plan 1. Right foot pain Report no fever.Unable to take NSAID's due to her renal functions.instucted to take extra Strength Tylenol 500 mg tablet take 2 Tablets by mouth twice daily and one by mouth daily at 2 pm.  Notify provider's office if pain not control.   2. Wound of right lower extremity, subsequent encounter No odor or worsening drainage.continue wound care per wound center instruction.continue to follow up with wound center.   Labs/tests ordered: None  Next appt:  10/09/2019 for right foot  Wound  Spent 11 minutes of non-face to face with patient

## 2019-10-04 ENCOUNTER — Other Ambulatory Visit: Payer: Self-pay

## 2019-10-04 ENCOUNTER — Encounter (HOSPITAL_COMMUNITY): Payer: Self-pay

## 2019-10-04 ENCOUNTER — Emergency Department (HOSPITAL_COMMUNITY): Payer: Medicare Other

## 2019-10-04 ENCOUNTER — Emergency Department (HOSPITAL_COMMUNITY)
Admission: EM | Admit: 2019-10-04 | Discharge: 2019-10-04 | Disposition: A | Discharge status: Home / Self Care | Payer: Medicare Other | Attending: Emergency Medicine | Admitting: Emergency Medicine

## 2019-10-04 DIAGNOSIS — I129 Hypertensive chronic kidney disease with stage 1 through stage 4 chronic kidney disease, or unspecified chronic kidney disease: Secondary | ICD-10-CM | POA: Diagnosis not present

## 2019-10-04 DIAGNOSIS — Z794 Long term (current) use of insulin: Secondary | ICD-10-CM | POA: Diagnosis not present

## 2019-10-04 DIAGNOSIS — R001 Bradycardia, unspecified: Secondary | ICD-10-CM | POA: Insufficient documentation

## 2019-10-04 DIAGNOSIS — Z79899 Other long term (current) drug therapy: Secondary | ICD-10-CM | POA: Insufficient documentation

## 2019-10-04 DIAGNOSIS — E1122 Type 2 diabetes mellitus with diabetic chronic kidney disease: Secondary | ICD-10-CM | POA: Diagnosis not present

## 2019-10-04 DIAGNOSIS — R918 Other nonspecific abnormal finding of lung field: Secondary | ICD-10-CM | POA: Diagnosis not present

## 2019-10-04 DIAGNOSIS — E11621 Type 2 diabetes mellitus with foot ulcer: Secondary | ICD-10-CM | POA: Diagnosis not present

## 2019-10-04 DIAGNOSIS — E162 Hypoglycemia, unspecified: Secondary | ICD-10-CM | POA: Diagnosis not present

## 2019-10-04 DIAGNOSIS — E11649 Type 2 diabetes mellitus with hypoglycemia without coma: Secondary | ICD-10-CM | POA: Diagnosis not present

## 2019-10-04 DIAGNOSIS — N183 Chronic kidney disease, stage 3 unspecified: Secondary | ICD-10-CM | POA: Insufficient documentation

## 2019-10-04 DIAGNOSIS — Z7901 Long term (current) use of anticoagulants: Secondary | ICD-10-CM | POA: Diagnosis not present

## 2019-10-04 DIAGNOSIS — E1165 Type 2 diabetes mellitus with hyperglycemia: Secondary | ICD-10-CM | POA: Diagnosis not present

## 2019-10-04 DIAGNOSIS — E161 Other hypoglycemia: Secondary | ICD-10-CM | POA: Diagnosis not present

## 2019-10-04 DIAGNOSIS — L97511 Non-pressure chronic ulcer of other part of right foot limited to breakdown of skin: Secondary | ICD-10-CM | POA: Insufficient documentation

## 2019-10-04 DIAGNOSIS — R410 Disorientation, unspecified: Secondary | ICD-10-CM | POA: Diagnosis not present

## 2019-10-04 DIAGNOSIS — Z87891 Personal history of nicotine dependence: Secondary | ICD-10-CM | POA: Diagnosis not present

## 2019-10-04 DIAGNOSIS — R404 Transient alteration of awareness: Secondary | ICD-10-CM | POA: Diagnosis not present

## 2019-10-04 LAB — CBC WITH DIFFERENTIAL/PLATELET
Abs Immature Granulocytes: 0.16 10*3/uL — ABNORMAL HIGH (ref 0.00–0.07)
Basophils Absolute: 0 10*3/uL (ref 0.0–0.1)
Basophils Relative: 0 %
Eosinophils Absolute: 0.1 10*3/uL (ref 0.0–0.5)
Eosinophils Relative: 2 %
HCT: 29.2 % — ABNORMAL LOW (ref 36.0–46.0)
Hemoglobin: 9.3 g/dL — ABNORMAL LOW (ref 12.0–15.0)
Immature Granulocytes: 2 %
Lymphocytes Relative: 14 %
Lymphs Abs: 1.3 10*3/uL (ref 0.7–4.0)
MCH: 30.1 pg (ref 26.0–34.0)
MCHC: 31.8 g/dL (ref 30.0–36.0)
MCV: 94.5 fL (ref 80.0–100.0)
Monocytes Absolute: 0.8 10*3/uL (ref 0.1–1.0)
Monocytes Relative: 9 %
Neutro Abs: 7.1 10*3/uL (ref 1.7–7.7)
Neutrophils Relative %: 73 %
Platelets: 236 10*3/uL (ref 150–400)
RBC: 3.09 MIL/uL — ABNORMAL LOW (ref 3.87–5.11)
RDW: 12 % (ref 11.5–15.5)
WBC: 9.6 10*3/uL (ref 4.0–10.5)
nRBC: 0 % (ref 0.0–0.2)

## 2019-10-04 LAB — URINALYSIS, ROUTINE W REFLEX MICROSCOPIC
Bilirubin Urine: NEGATIVE
Glucose, UA: 150 mg/dL — AB
Hgb urine dipstick: NEGATIVE
Ketones, ur: NEGATIVE mg/dL
Leukocytes,Ua: NEGATIVE
Nitrite: NEGATIVE
Protein, ur: 100 mg/dL — AB
Specific Gravity, Urine: 1.013 (ref 1.005–1.030)
pH: 6 (ref 5.0–8.0)

## 2019-10-04 LAB — BASIC METABOLIC PANEL
Anion gap: 11 (ref 5–15)
BUN: 18 mg/dL (ref 8–23)
CO2: 22 mmol/L (ref 22–32)
Calcium: 9.5 mg/dL (ref 8.9–10.3)
Chloride: 100 mmol/L (ref 98–111)
Creatinine, Ser: 1.42 mg/dL — ABNORMAL HIGH (ref 0.44–1.00)
GFR calc Af Amer: 40 mL/min — ABNORMAL LOW (ref >60)
GFR calc non Af Amer: 34 mL/min — ABNORMAL LOW (ref >60)
Glucose, Bld: 215 mg/dL — ABNORMAL HIGH (ref 70–99)
Potassium: 3.9 mmol/L (ref 3.5–5.1)
Sodium: 133 mmol/L — ABNORMAL LOW (ref 135–145)

## 2019-10-04 LAB — SEDIMENTATION RATE: Sed Rate: 63 mm/hr — ABNORMAL HIGH (ref 0–22)

## 2019-10-04 LAB — CBG MONITORING, ED: Glucose-Capillary: 232 mg/dL — ABNORMAL HIGH (ref 70–99)

## 2019-10-04 NOTE — Progress Notes (Signed)
Monique Cabrera, Monique Cabrera ([id number]) Visit Report for 10/01/2019 Allergy List Details Patient Name: Date of Service: Monique, Cabrera 10/01/2019 10:30 AM Medical Record [id number] Patient Account Number: [id number] Date of Birth/Sex: Treating RN: 09-28-1935 (84 y.o. Female) Baruch Gouty Primary Care Midas Daughety: Hollace Kinnier Other Clinician: Referring Randall Colden: Treating Rhen Dossantos/Extender:Robson, Arlyss Queen, Tiffany Weeks in Treatment: 0 Allergies Active Allergies Invokana Reaction: vaginal itching and swelling Severity: Moderate Jardiance Reaction: vaginal itching and swelling Severity: Moderate Allergy Notes Electronic Signature(s) Signed: 10/02/2019 5:55:43 PM By: Baruch Gouty RN, BSN Entered By: Baruch Gouty on 10/01/2019 10:12:51 -------------------------------------------------------------------------------- Arrival Information Details Patient Name: Date of Service: Monique Cabrera. 10/01/2019 10:30 AM Medical Record [id number] Patient Account Number: [id number] Date of Birth/Sex: Treating RN: 11-Oct-1935 (84 y.o. Female) Baruch Gouty Primary Care Jenetta Wease: Hollace Kinnier Other Clinician: Referring Apurva Reily: Treating Kemond Amorin/Extender:Robson, Arlyss Queen, Lindell Spar in Treatment: 0 Visit Information Patient Arrived: Cane Arrival Time: 10:09 Accompanied By: self Transfer Assistance: None Patient Identification Verified: Yes Secondary Verification Process Completed: Yes Patient Requires Transmission-Based No Precautions: Patient Has Alerts: No Electronic Signature(s) Signed: 10/02/2019 5:55:43 PM By: Baruch Gouty RN, BSN Entered By: Baruch Gouty on 10/01/2019 10:10:33 -------------------------------------------------------------------------------- Clinic Level of Care Assessment Details Patient Name: Date of Service: Monique Cabrera. 10/01/2019 10:30 AM Medical Record [id number] Patient Account Number: [id number] Date of  Birth/Sex: Treating RN: 08/10/1935 (84 y.o. Female) Kela Millin Primary Care Gerard Bonus: Hollace Kinnier Other Clinician: Referring Judaea Burgoon: Treating Perry Brucato/Extender:Robson, Arlyss Queen, Tiffany Weeks in Treatment: 0 Clinic Level of Care Assessment Items TOOL 2 Quantity Score X - Use when only an EandM is performed on the INITIAL visit 1 0 ASSESSMENTS - Nursing Assessment / Reassessment X - General Physical Exam (combine w/ comprehensive assessment (listed just below) 1 20 when performed on new pt. evals) X - Comprehensive Assessment (HX, ROS, Risk Assessments, Wounds Hx, etc.) 1 25 ASSESSMENTS - Wound and Skin Assessment / Reassessment X - Simple Wound Assessment / Reassessment - one wound 1 5 []  - Complex Wound Assessment / Reassessment - multiple wounds 0 []  - Dermatologic / Skin Assessment (not related to wound area) 0 ASSESSMENTS - Ostomy and/or Continence Assessment and Care []  - Incontinence Assessment and Management 0 []  - Ostomy Care Assessment and Management (repouching, etc.) 0 PROCESS - Coordination of Care X - Simple Patient / Family Education for ongoing care 1 15 []  - Complex (extensive) Patient / Family Education for ongoing care 0 X - Staff obtains Programmer, systems, Records, Test Results / Process Orders 1 10 []  - Staff telephones HHA, Nursing Homes / Clarify orders / etc 0 []  - Routine Transfer to another Facility (non-emergent condition) 0 []  - Routine Hospital Admission (non-emergent condition) 0 X - New Admissions / Biomedical engineer / Ordering NPWT, Apligraf, etc. 1 15 []  - Emergency Hospital Admission (emergent condition) 0 X - Simple Discharge Coordination 1 10 []  - Complex (extensive) Discharge Coordination 0 PROCESS - Special Needs []  - Pediatric / Minor Patient Management 0 []  - Isolation Patient Management 0 []  - Hearing / Language / Visual special needs 0 []  - Assessment of Community assistance (transportation, D/C planning, etc.) 0 []  -  Additional assistance / Altered mentation 0 []  - Support Surface(s) Assessment (bed, cushion, seat, etc.) 0 INTERVENTIONS - Wound Cleansing / Measurement X - Wound Imaging (photographs - any number of wounds) 1 5 []  - Wound Tracing (instead of photographs) 0 X - Simple Wound Measurement - one wound 1 5 []  - Complex Wound Measurement - multiple wounds 0 X - Simple  Wound Cleansing - one wound 1 5 []  - Complex Wound Cleansing - multiple wounds 0 INTERVENTIONS - Wound Dressings X - Small Wound Dressing one or multiple wounds 1 10 []  - Medium Wound Dressing one or multiple wounds 0 []  - Large Wound Dressing one or multiple wounds 0 []  - Application of Medications - injection 0 INTERVENTIONS - Miscellaneous []  - External ear exam 0 X - Specimen Collection (cultures, biopsies, blood, body fluids, etc.) 1 5 X - Specimen(s) / Culture(s) sent or taken to Lab for analysis 1 5 []  - Patient Transfer (multiple staff / Civil Service fast streamer / Similar devices) 0 []  - Simple Staple / Suture removal (25 or less) 0 []  - Complex Staple / Suture removal (26 or more) 0 []  - Hypo / Hyperglycemic Management (close monitor of Blood Glucose) 0 X - Ankle / Brachial Index (ABI) - do not check if billed separately 1 15 Has the patient been seen at the hospital within the last three years: Yes Total Score: 150 Level Of Care: New/Established - Level 4 Electronic Signature(s) Signed: 10/04/2019 4:29:59 PM By: Kela Millin Entered By: Kela Millin on 10/01/2019 11:11:46 -------------------------------------------------------------------------------- Encounter Discharge Information Details Patient Name: Date of Service: Monique Cabrera. 10/01/2019 10:30 AM Medical Record [id number] Patient Account Number: [id number] Date of Birth/Sex: Treating RN: 1935/11/07 (84 y.o. Female) Deon Pilling Primary Care Britain Anagnos: Hollace Kinnier Other Clinician: Referring Zarriah Starkel: Treating Masiyah Engen/Extender:Robson,  Arlyss Queen, Lindell Spar in Treatment: 0 Encounter Discharge Information Items Discharge Condition: Stable Ambulatory Status: Cane Discharge Destination: Home Transportation: Private Auto Accompanied By: self Schedule Follow-up Appointment: Yes Clinical Summary of Care: Notes 1130- patient stood up and notice she felt dizzy. Per patient felt as if her sugar was dropping. Check blood glucose 52. patient ate her own pack of glucose gel. Provided patient with two packs of graham crackers. 1140- blood glucose recheck 41. patient took her own glucose tablet. Peanut butter crackers provided along with lemonade. Will recheck her blood glucose 15 minutes. 1155- rechecked blood glucose now 66. Per patient is feeling better. MD made aware. Educated patient to carry the crackers with her on her transportation ride. Patient takes transportation and does not drive. Encouraged patient to eat a meal when she gets home and to notify PCP of her blood glucose dropping. Patient in agreement. Patient is stable walking with cane and escorted patient to lobby. Electronic Signature(s) Signed: 10/01/2019 5:53:08 PM By: Deon Pilling Entered By: Deon Pilling on 10/01/2019 12:01:14 -------------------------------------------------------------------------------- Lower Extremity Assessment Details Patient Name: Date of Service: LORIE, CLECKLEY 10/01/2019 10:30 AM Medical Record [id number] Patient Account Number: [id number] Date of Birth/Sex: Treating RN: 1935-05-28 (84 y.o. Female) Baruch Gouty Primary Care Teddie Mehta: Hollace Kinnier Other Clinician: Referring Bayli Quesinberry: Treating Lewie Deman/Extender:Robson, Arlyss Queen, Tiffany Weeks in Treatment: 0 Edema Assessment Assessed: [Left: No] [Right: No] Edema: [Left: N] [Right: o] Calf Left: Right: Point of Measurement: 30 cm From Medial Instep cm 29 cm Ankle Left: Right: Point of Measurement: 8 cm From Medial Instep cm 18.5 cm Vascular  Assessment Pulses: Dorsalis Pedis Palpable: [Right:No] Blood Pressure: Brachial: [Right:129] Ankle: [Right:Posterior Tibial: 100 0.78] Notes DP pulse noncompressible Electronic Signature(s) Signed: 10/02/2019 5:55:43 PM By: Baruch Gouty RN, BSN Entered By: Baruch Gouty on 10/01/2019 10:45:30 -------------------------------------------------------------------------------- Multi Wound Chart Details Patient Name: Date of Service: Monique Cabrera. 10/01/2019 10:30 AM Medical Record [id number] Patient Account Number: [id number] Date of Birth/Sex: Treating RN: 09-Oct-1935 (84 y.o. Female) Levan Hurst Primary Care Javarious Elsayed: Hollace Kinnier Other Clinician: Referring Charlesia Canaday: Treating Renise Gillies/Extender:Robson, Legrand Como  Reed, Tiffany Weeks in Treatment: 0 Vital Signs Height(in): 65 Capillary Blood 114 Glucose(mg/dl): Weight(lbs): 138 Pulse(bpm): 79 Body Mass Index(BMI): 23 Blood Pressure(mmHg): 129/69 Temperature(F): 98.7 Respiratory 18 18 Rate(breaths/min): Photos: [1:No Photos] [N/A:N/A] Wound Location: [1:Right Toe - Web between N/A 4th and 5th] Wounding Event: [1:Gradually Appeared] [N/A:N/A] Primary Etiology: [1:Diabetic Wound/Ulcer of the N/A Lower Extremity] Comorbid History: [1:Cataracts, Anemia, Hypertension, Peripheral Arterial Disease, Type II Diabetes, Neuropathy, Confinement Anxiety] [N/A:N/A] Date Acquired: [1:09/05/2019] [N/A:N/A] Weeks of Treatment: [1:0] [N/A:N/A] Wound Status: [1:Open] [N/A:N/A] Measurements L x W x D 0.4x0.3x2.7 [N/A:N/A] (cm) Area (cm) : [1:0.094] [N/A:N/A] Volume (cm) : [1:0.254] [N/A:N/A] Classification: [1:Grade 2] [N/A:N/A] Exudate Amount: [1:Medium] [N/A:N/A] Exudate Type: [1:Purulent] [N/A:N/A] Exudate Color: [1:yellow, brown, green] [N/A:N/A] Wound Margin: [1:Well defined, not attached N/A] Granulation Amount: [1:Large (67-100%)] [N/A:N/A] Granulation Quality: [1:Pink, Pale] [N/A:N/A] Necrotic Amount: [1:None  Present (0%)] [N/A:N/A] Exposed Structures: [1:Fat Layer (Subcutaneous N/A Tissue) Exposed: Yes Fascia: No Tendon: No Muscle: No Joint: No Bone: No None] [N/A:N/A] Treatment Notes Electronic Signature(s) Signed: 10/01/2019 6:11:18 PM By: Linton Ham MD Signed: 10/01/2019 6:17:12 PM By: Levan Hurst RN, BSN Entered By: Linton Ham on 10/01/2019 11:18:52 -------------------------------------------------------------------------------- Multi-Disciplinary Care Plan Details Patient Name: Date of Service: Monique Cabrera. 10/01/2019 10:30 AM Medical Record [id number] Patient Account Number: [id number] Date of Birth/Sex: Treating RN: [birth year] (85 y.o. Female) Kela Millin Primary Care Breigh Annett: Hollace Kinnier Other Clinician: Referring Marce Charlesworth: Treating Yarianna Varble/Extender:Robson, Arlyss Queen, Lindell Spar in Treatment: 0 Active Inactive Nutrition Nursing Diagnoses: Imbalanced nutrition Impaired glucose control: actual or potential Goals: Patient/caregiver agrees to and verbalizes understanding of need to use nutritional supplements and/or vitamins as prescribed Date Initiated: 10/01/2019 Target Resolution Date: 11/02/2019 Goal Status: Active Patient/caregiver verbalizes understanding of need to maintain therapeutic glucose control per primary care physician Date Initiated: 10/01/2019 Target Resolution Date: 11/02/2019 Goal Status: Active Interventions: Assess HgA1c results as ordered upon admission and as needed Assess patient nutrition upon admission and as needed per policy Provide education on elevated blood sugars and impact on wound healing Provide education on nutrition Notes: Wound/Skin Impairment Nursing Diagnoses: Impaired tissue integrity Knowledge deficit related to ulceration/compromised skin integrity Goals: Patient/caregiver will verbalize understanding of skin care regimen Date Initiated: 10/01/2019 Target Resolution Date: 11/02/2019 Goal  Status: Active Ulcer/skin breakdown will have a volume reduction of 30% by week 4 Date Initiated: 10/01/2019 Target Resolution Date: 11/02/2019 Goal Status: Active Interventions: Assess patient/caregiver ability to obtain necessary supplies Assess patient/caregiver ability to perform ulcer/skin care regimen upon admission and as needed Assess ulceration(s) every visit Provide education on ulcer and skin care Notes: Electronic Signature(s) Signed: 10/04/2019 4:29:59 PM By: Kela Millin Entered By: Kela Millin on 10/01/2019 10:57:38 -------------------------------------------------------------------------------- Pain Assessment Details Patient Name: Date of Service: Monique Cabrera 10/01/2019 10:30 AM Medical Record [id number] Patient Account Number: [id number] Date of Birth/Sex: Treating RN: 08-14-1935 (84 y.o. Female) Baruch Gouty Primary Care Tannar Broker: Hollace Kinnier Other Clinician: Referring Sabirin Baray: Treating Jaidynn Balster/Extender:Robson, Arlyss Queen, Tiffany Weeks in Treatment: 0 Active Problems Location of Pain Severity and Description of Pain Patient Has Paino Yes Site Locations Pain Location: Pain in Ulcers With Dressing Change: Yes Duration of the Pain. Constant / Intermittento Intermittent Rate the pain. Current Pain Level: 1 Worst Pain Level: 8 Least Pain Level: 0 Character of Pain Describe the Pain: Aching, Burning Pain Management and Medication Current Pain Management: Medication: No Cold Application: No Rest: No Massage: No Activity: Yes T.E.N.S.: No Heat Application: No Leg drop or elevation: No Is the Current Pain Management Adequate: Adequate How does  your wound impact your activities of daily livingo Sleep: Yes Bathing: No Appetite: No Relationship With Others: No Bladder Continence: No Emotions: No Bowel Continence: No Hobbies: Yes Toileting: No Dressing: No Electronic Signature(s) Signed: 10/02/2019 5:55:43 PM By:  Baruch Gouty RN, BSN Entered By: Baruch Gouty on 10/01/2019 10:42:30 -------------------------------------------------------------------------------- Patient/Caregiver Education Details Patient Name: Monique Cabrera 10/5/2020andnbsp10:30 Date of Service: AM Medical Record [id number] Number: Patient Account Number: [id number] Treating RN: [birth year] (84 y.o. Kela Millin Date of Birth/Gender: Female) Other Clinician: Primary Care Treating Jacqlyn Krauss Physician: Physician/Extender: Referring Physician: Bevely Palmer in Treatment: 0 Education Assessment Education Provided To: Patient Education Topics Provided Elevated Blood Sugar/ Impact on Healing: Methods: Explain/Verbal Responses: State content correctly Nutrition: Methods: Explain/Verbal Responses: State content correctly Wound/Skin Impairment: Methods: Explain/Verbal Responses: State content correctly Electronic Signature(s) Signed: 10/04/2019 4:29:59 PM By: Kela Millin Entered By: Kela Millin on 10/01/2019 11:00:12 -------------------------------------------------------------------------------- Wound Assessment Details Patient Name: Date of Service: Monique Cabrera. 10/01/2019 10:30 AM Medical Record [id number] Patient Account Number: [id number] Date of Birth/Sex: Treating RN: October 29, 1935 (84 y.o. Female) Baruch Gouty Primary Care Salbador Fiveash: Hollace Kinnier Other Clinician: Referring Miaisabella Bacorn: Treating Kamisha Ell/Extender:Robson, Arlyss Queen, Tiffany Weeks in Treatment: 0 Wound Status Wound Number: 1 Primary Diabetic Wound/Ulcer of the Lower Extremity Etiology: Wound Location: Right Toe - Web between 4th and 5th Wound Open Status: Status: Wounding Event: Gradually Appeared Comorbid Cataracts, Anemia, Hypertension, Peripheral Date Acquired: 09/05/2019 History: Arterial Disease, Type II Diabetes, Neuropathy, Weeks Of Treatment: 0 Confinement  Anxiety Clustered Wound: No Photos Wound Measurements Length: (cm) 0.4 Width: (cm) 0.3 Depth: (cm) 2.7 Area: (cm) 0.094 Volume: (cm) 0.254 Wound Description Classification: Grade 2 Wound Margin: Well defined, not attached Exudate Amount: Medium Exudate Type: Purulent Exudate Color: yellow, brown, green Wound Bed Granulation Amount: Large (67-100%) Granulation Quality: Pink, Pale Necrotic Amount: None Present (0%) dor After Cleansing: No /Fibrino No Exposed Structure Exposed: No yer (Subcutaneous Tissue) Exposed: Yes Exposed: No Exposed: No Exposed: No xposed: No % Reduction in Area: 0% % Reduction in Volume: 0% Epithelialization: None Tunneling: No Foul O Slough Fascia Fat La Tendon Muscle Joint Bone E Treatment Notes Wound #1 (Right Toe - Web between 4th and 5th) 1. Cleanse With Wound Cleanser 3. Primary Dressing Applied Calcium Alginate Ag 4. Secondary Dressing Dry Gauze Roll Gauze 5. Secured With Tape 7. Footwear/Offloading device applied Surgical shoe Notes explained the orders, dressings, frequency of change, and when to return to wound center. patient in agreement. Electronic Signature(s) Signed: 10/02/2019 12:19:24 PM By: Mikeal Hawthorne EMT/HBOT Signed: 10/02/2019 5:55:43 PM By: Baruch Gouty RN, BSN Entered By: Mikeal Hawthorne on 10/02/2019 09:08:11 -------------------------------------------------------------------------------- Vitals Details Patient Name: Date of Service: Monique Cabrera. 10/01/2019 10:30 AM Medical Record [id number] Patient Account Number: [id number] Date of Birth/Sex: Treating RN: May 03, 1935 (84 y.o. Female) Baruch Gouty Primary Care Blimie Vaness: Hollace Kinnier Other Clinician: Referring Mikeila Burgen: Treating Sidnie Swalley/Extender:Robson, Arlyss Queen, Tiffany Weeks in Treatment: 0 Vital Signs Time Taken: 10:10 Temperature (F): 98.7 Height (in): 65 Pulse (bpm): 79 Source: Stated Respiratory Rate (breaths/min):  18 Weight (lbs): 138 Blood Pressure (mmHg): 129/69 Source: Stated Capillary Blood Glucose (mg/dl): 114 Body Mass Index (BMI): 23 Reference Range: 80 - 120 mg / dl Notes glucose per pt report Electronic Signature(s) Signed: 10/02/2019 5:55:43 PM By: Baruch Gouty RN, BSN Entered By: Baruch Gouty on 10/01/2019 10:11:20

## 2019-10-04 NOTE — ED Provider Notes (Addendum)
[facility] EMERGENCY DEPARTMENT Provider Note   CSN: [id number] Arrival date & time: 10/04/19  1720     History   Chief Complaint Chief Complaint  Patient presents with  . Hypoglycemia  . Bradycardia    HPI Monique Cabrera is a 84 y.o. female.     HPI  The patient is an 84 year old female with a history of CKD, DM2 (on Lantus and Humalog), CVA, who presents to the ED following an episode of confusion and altered mental status.   EMS was reportedly called out to the patient's home due to confusion. They state that she was acutely altered at the time with a CBG of 35. She was given 82m oral glucose and her CBG came up to the 40s. Following this, she became more unresponsive. EMS then gave glucagon and 260mof D10. Recent CBG with EMS was 300. The patient's mental status improved following glucagon and D10 administration.  She arrived to the ED AAOx3, hemodynamically stable, with her only complaint being a chronic right foot ulceration that has made it hard to walk.  Past Medical History:  Diagnosis Date  . Acute upper respiratory infections of unspecified site   . Anemia   . Anemia, unspecified   . Atherosclerosis of native arteries of the extremities, unspecified   . Chest pain, unspecified   . Chronic kidney disease (CKD), stage II (mild)   . Diabetes mellitus   . Diarrhea   . Disorder of bone and cartilage, unspecified   . DM (diabetes mellitus) type II controlled with renal manifestation (HCHarmony  . Herpes zoster with other nervous system complications(053.19)   . Hypercalcemia   . Hypertension   . Hypertensive renal disease, benign   . Nonspecific reaction to tuberculin skin test without active tuberculosis(795.51)   . Nonspecific tuberculin test reaction   . Other and unspecified hyperlipidemia   . Pain in joint, lower leg   . Peripheral arterial disease (HCLiver[place]  . Postherpetic neuralgia   . Proteinuria   . Stroke (HCLowndesville02/2018  . Type II or  unspecified type diabetes mellitus with renal manifestations, not stated as uncontrolled(250.40)   . Type II or unspecified type diabetes mellitus with renal manifestations, uncontrolled(250.42)   . Unspecified disorder of kidney and ureter   . Unspecified essential hypertension     Patient Active Problem List   Diagnosis Date Noted  . Stable proliferative diabetic retinopathy of both eyes associated with type 2 diabetes mellitus (HCDuluth06/09/2018  . Acute CVA (cerebrovascular accident) (HCParsons02/23/2018  . Acute ischemic stroke (HCNew Port Richey East  . Internuclear ophthalmoplegia of left eye   . Benign paroxysmal positional vertigo   . Abnormality of gait   . Acute onset of severe vertigo 02/17/2017  . Vertigo 02/17/2017  . Atherosclerosis of native arteries of the extremities with intermittent claudication 03/11/2014  . Diabetic retinopathy (HCHillcrest Heights03/16/2015  . Retinal hemorrhage due to secondary diabetes (HCEast Glac[facility] [place]03/16/2015  . Type II diabetes mellitus with renal manifestations, uncontrolled (HCPierson03/16/2015  . Chronic hepatitis C without hepatic coma (HCDun[place]03/16/2015  . Hyperlipidemia 03/11/2014  . Hypoglycemia 04/16/2013  . Disorder of bone and cartilage, unspecified   . Other and unspecified hyperlipidemia   . Essential hypertension, benign   . Atherosclerosis of native arteries of the extremities, unspecified   . Chronic kidney disease (CKD), stage II (mild)   . Peripheral arterial disease (HCBelvidere  . Anemia   . Postherpetic neuralgia   . Hypertension  Past Surgical History:  Procedure Laterality Date  . hysterectomy    . INCISION AND DRAINAGE Left 05/27/14   sebacous cyst, ear  . PRP Left    Dr. Ricki Miller  . removal of cyst from hand    . removal of tumor from foot    . TONSILLECTOMY       OB History   No obstetric history on file.      Home Medications    Prior to Admission medications   Medication Sig Start Date End Date Taking? Authorizing Provider  acetaminophen  (TYLENOL) 500 MG tablet Take 2 tablets (1,000 mg total) by mouth 2 (two) times daily. And 500 mg tablet daily at 2 pm 10/03/19   Ngetich, [name] C, NP  amLODipine (NORVASC) 10 MG tablet TAKE 1 TABLET (10 MG TOTAL) BY MOUTH DAILY. FOR HIGH BLOOD PRESSURE 06/13/19   Reed, Tiffany L, DO  atorvastatin (LIPITOR) 20 MG tablet TAKE 1 TABLET BY MOUTH EVERY DAY 06/11/19   Despina Hick, MD  carvedilol (COREG) 25 MG tablet TAKE 1 TABLET BY MOUTH TWICE A DAY WITH A MEAL 12/26/18   Reed, Tiffany L, DO  clopidogrel (PLAVIX) 75 MG tablet TAKE ONE TABLET BY MOUTH ONCE DAILY 12/26/18   Reed, Tiffany L, DO  Insulin Glargine (LANTUS) 100 UNIT/ML Solostar Pen INJECT 50 UNITS UNDER THE SKIN DAILY FOR DIABETES 01/15/19   Reed, Tiffany L, DO  insulin lispro (HUMALOG KWIKPEN) 100 UNIT/ML KwikPen INJECT 6 UNITS BREAKFAST, 6 UNITS LUNCH & SUPPER 08/13/19   Reed, Tiffany L, DO  Insulin Pen Needle (B-D ULTRAFINE III SHORT PEN) 31G X 8 MM MISC Use to check blood sugar every day. Dx: 11.29; 11.65 07/02/19   Reed, Tiffany L, DO  ONETOUCH VERIO test strip USE TO TEST BLOOD SUGAR THREE TIMES DAILY. DX: E11.9 07/17/18   Reed, Tiffany L, DO  valsartan-hydrochlorothiazide (DIOVAN-HCT) 320-25 MG tablet TAKE 1 TABLET BY MOUTH EVERY DAY 01/08/19   [name], DO    Family History Family History  Problem Relation Age of Onset  . Diabetes Mother   . Diabetes Father   . Diabetes Sister   . Diabetes Sister     Social History Social History   Tobacco Use  . Smoking status: Former Smoker    Types: Cigarettes  . Smokeless tobacco: Never Used  . Tobacco comment: Quit about age 58   Substance Use Topics  . Alcohol use: No    Alcohol/week: 0.0 standard drinks  . Drug use: No     Allergies   Invokana [canagliflozin] and Jardiance [empagliflozin]   Review of Systems Review of Systems   Physical Exam Updated Vital Signs BP (!) 153/66   Pulse (!) 51   Resp 20   Ht _0  (1.651 m)   Wt 62.6 kg   SpO2 100%   BMI 22.96  kg/m   Physical Exam Vitals signs and nursing note reviewed.  Constitutional:      General: She is not in acute distress.    Appearance: She is well-developed. She is obese.  HENT:     Head: Normocephalic and atraumatic.  Eyes:     Conjunctiva/sclera: Conjunctivae normal.  Neck:     Musculoskeletal: Neck supple.  Cardiovascular:     Rate and Rhythm: Regular rhythm. Bradycardia present.     Pulses: Normal pulses.  Pulmonary:     Effort: Pulmonary effort is normal. No respiratory distress.     Breath sounds: Normal breath sounds.  Abdominal:  Palpations: Abdomen is soft.     Tenderness: There is no abdominal tenderness.  Musculoskeletal:     Comments: Chronic diabetic wound along the dorsum of the right foot and wound in between the 4th and 5th digit. No erythema or drainage.  Skin:    General: Skin is warm and dry.  Neurological:     Mental Status: She is alert.     Comments: AAOx3, CN II-XII intact, strength 5/5 all four extremities. Sensation intact all four extremities to light touch. No tremor.       ED Treatments / Results  Labs (all labs ordered are listed, but only abnormal results are displayed) Labs Reviewed  CBC WITH DIFFERENTIAL/PLATELET - Abnormal; Notable for the following components:      Result Value   RBC 3.09 (*)    Hemoglobin 9.3 (*)    HCT 29.2 (*)    Abs Immature Granulocytes 0.16 (*)    All other components within normal limits  BASIC METABOLIC PANEL - Abnormal; Notable for the following components:   Sodium 133 (*)    Glucose, Bld 215 (*)    Creatinine, Ser 1.42 (*)    GFR calc non Af Amer 34 (*)    GFR calc Af Amer 40 (*)    All other components within normal limits  SEDIMENTATION RATE - Abnormal; Notable for the following components:   Sed Rate 63 (*)    All other components within normal limits  CBG MONITORING, ED - Abnormal; Notable for the following components:   Glucose-Capillary 232 (*)    All other components within normal  limits  URINALYSIS, ROUTINE W REFLEX MICROSCOPIC    EKG EKG Interpretation  Date/Time:  Thursday October 04 2019 17:40:26 EDT Ventricular Rate:  49 PR Interval:    QRS Duration: 92 QT Interval:  476 QTC Calculation: 430 R Axis:   -29 Text Interpretation:  Sinus bradycardia Probable LVH with secondary repol abnrm Abnormal ECG Confirmed by Carmin Muskrat [phone]) on 10/04/2019 5:47:38 PM   Radiology Dg Chest Portable 1 View  Result Date: 10/04/2019 CLINICAL DATA:  Syncope. EXAM: PORTABLE CHEST 1 VIEW COMPARISON:  Chest radiograph 09/11/2019 FINDINGS: Monitoring leads overlie the patient. Stable cardiac and mediastinal contours. Aortic atherosclerosis. Left-greater-than-right basilar heterogeneous opacities. No large area pulmonary consolidation. No pleural effusion or pneumothorax. Thoracic spine degenerative changes. IMPRESSION: Minimal left greater right basilar opacities favored represent atelectasis. Infection not excluded. Electronically Signed   By: Lovey Newcomer M.D.   On: 10/04/2019 18:29   Dg Foot Complete Right  Result Date: 10/04/2019 CLINICAL DATA:  Diabetic foot ulcer right foot between fourth and fifth toes. Evaluate for osteomyelitis. EXAM: RIGHT FOOT COMPLETE - 3+ VIEW COMPARISON:  09/26/2019 and 09/11/2019 FINDINGS: Mild hallux valgus deformity with degenerative changes at the first MTP joint. Old distal fifth metatarsal fracture. No definite focal bone destruction to suggest osteomyelitis. No air within the soft tissues. Small vessel atherosclerotic disease is present. IMPRESSION: No acute findings. Electronically Signed   By: Marin Olp M.D.   On: 10/04/2019 19:12    Procedures Procedures (including critical care time)  Medications Ordered in ED Medications - No data to display   Initial Impression / Assessment and Plan / ED Course  I have reviewed the triage vital signs and the nursing notes.  Pertinent labs & imaging results that were available during my care  of the patient were reviewed by me and considered in my medical decision making (see chart for details).  Clinical Course as of  Oct 03 2025  Thu Oct 04, 2019  1748 Glucose-Capillary(!): 232 [JL]  1830 Hemoglobin(!): 9.3 [JL]  2021 Sed Rate(!): 63 [JL]    Clinical Course User Index [JL] Regan Lemming, MD       The patient arrived to the ED AAOx3, ABC intact, GCS15 after experiencing an episode of unresponsiveness associated with hypoglycemia. The patient's mental status improved following glucagon and D10 administration with EMS and had a CBG of 232 on arrival to the ED. On arrival, she stated that she had not had much to eat today but had taken her regularly scheduled Lantus and Humalog. She denies chest pain or shortness of breath.  DDX: Likely hypoglycemia from exogenous insulin administration in the setting of not eating. Given her non-healing diabetic ulcer on her right foot, additional consideration given to infectious trigger from possible osteomyelitis, UTI, or PNA.  EKG: sinus bradycardia, rate 49, possible LVH, no ST-T changes to suggest ongoing ischemia.  Imaging: CXR with L>R basilar opacities favored to represent atelectasis. XR of the right foot without evidence of fracture or osteomyelitis.   Labs: CBC without a leukocytosis, WBC 9.6, Hgb 9.3, a drop from 10.6 2 weeks ago. The patient denies melena or hematochezia. She denies hematemesis. MCV was 94.5. BMP showed improved Cr from prior labs at 1.42, BG 215. ESR was elevated. Doubt osteomyelitis due to negative imaging and negative WBC.   Urinalysis resulted with mild glucosuria and a few bacteria, negative nitrites or leukocytes.   At this time, the patient's hypoglycemia has resolved. No evidence of UTI or osteomyelitis. She was not dyspneic, did not have an O2 requirement, and was not febrile. Favor likely atelectasis as the cause of her CXR results. She was monitored for three hours in the Emergency Department without  recurrence of symptoms. Favor likely hypoglycemia from poor oral intake in the setting of insulin intake.   The patient requested help managing her chronic diabetic ulcers. Today her wounds are without evidence to suggest active infection. No erythema or drainage noted. Her WBC is normal. She has been attempting to manage her wounds at home but requested home health assistance. Social work contacted for further management and an order was placed for home health nursing. I advised that she follow-up with her PCP on recheck.   Final Clinical Impressions(s) / ED Diagnoses   Final diagnoses:  None    ED Discharge Orders              10/04/19 1953    Face-to-face encounter (required for Medicare/Medicaid patients)    Comments: I Carmin Muskrat certify that this patient is under my care and that I, or a nurse practitioner or physician's assistant working with me, had a face-to-face encounter that meets the physician face-to-face encounter requirements with this patient on 10/04/2019. The encounter with the patient was in whole, or in part for the following medical condition(s) which is the primary reason for home health care (List medical condition): additional orders per PMD   10/04/19 1953           Regan Lemming, MD 10/04/19 2138    Regan Lemming, MD 10/04/19 1062    Carmin Muskrat, MD 10/05/19 2357

## 2019-10-04 NOTE — TOC Transition Note (Signed)
Transition of Care [name]) - CM/SW Discharge Note   Patient Details  Name: Monique Cabrera MRN: [id number] Date of Birth: 1935-08-12  [name] CM/SW Contact:  Apolonio Schneiders, RN Phone Number: 10/04/2019, 8:05 PM   Clinical Narrative:    ED CM spoke with the patient and her daughter at the bedside. Provided with a Medicare [employer] [place] list. Patient's daughter selected [facility] for home health nursing visits. Patient states she has a healing fracture of her left foot and the wound is on her right foot. She states she spends most of the day in bed. She is alone most of the time but reports her son comes to check on her and prepares meals. She has transportation through social services. Patient reports she is able to afford her medications including her insulin. Orders, face sheet and MD notes faxed to [facility]. Confirmation received.    Patient Goals and CMS Choice        Discharge Placement                       Discharge Plan and Services                                     Social Determinants of Health (SDOH) Interventions     Readmission Risk Interventions No flowsheet data found.

## 2019-10-04 NOTE — Discharge Instructions (Addendum)
Please follow-up with your primary doctor in 2-3 days for a recheck. We have ordered home health nursing to help with wound care. There was no evidence of bone involvement on imaging or labs.

## 2019-10-04 NOTE — Progress Notes (Signed)
Monique Cabrera ([id number]) Visit Report for 10/01/2019 Chief Complaint Document Details Patient Name: Date of Service: Monique Cabrera 10/01/2019 10:30 AM Medical Record Patient Account Number: [id number] Number: Treating RN: Levan Hurst Date of Birth/Sex: 1935-10-05 (84 y.o. Female) Other Clinician: Primary Care Provider: Hollace Kinnier Treating Provider/Extender:Oluwatoyin Banales, Legrand Como Referring Provider: Bevely Palmer in Treatment: 0 Information Obtained from: Patient Chief Complaint 10/01/2019; patient is here for review of a difficult wound in the webspace between the right fourth and fifth toes Electronic Signature(s) Signed: 10/01/2019 6:11:18 PM By: Linton Ham MD Entered By: Linton Ham on 10/01/2019 11:19:11 -------------------------------------------------------------------------------- HPI Details Patient Name: Date of Service: Monique Cabrera. 10/01/2019 10:30 AM Medical Record Patient Account Number: [id number] Number: Treating RN: Levan Hurst Date of Birth/Sex: [birth year] (84 y.o. Female) Other Clinician: Primary Care Provider: Hollace Kinnier Treating Provider/Extender:Dent Plantz, Legrand Como Referring Provider: Bevely Palmer in Treatment: 0 History of Present Illness HPI Description: ADMISSION 10/01/2019 This is an 84 year old woman with type 2 diabetes on insulin. Last hemoglobin A1c 9.5 in July. Her problem started out initially in late August she felt a popping sound she was seen in primary care at [facility] and felt to have gout in the left first metatarsal phalangeal. She was referred to podiatry and found a fracture that they found a fracture of the first metatarsal. They also noted a ulcer in the right fourth webspace roughly 2 to 4 weeks ago. She was seen in the ER with pain and swelling in the right foot. An x-ray showed no osseous deformity. She was put on Keflex.. During her last appointment with podiatry she  was given Iodosorb and his mother placed on the wound. She lives alone and finds this difficult to do. She has just completed a course of Augmentin and doxycycline. This was apparently given to her in the ER on 9/15. The patient also tells me that she has a history of PAD. In fact several years ago was told that she needed a stent in the left leg but she never followed up with this. I am also not clear who she saw. She clearly has claudication with pain in the right ankle when she walks from her apartment to the mailbox in her building forcing her to stop and rest. ABIs in our clinic were at the posterior tibial on the right at 0.78. Past medical history; type 2 diabetes on insulin recent hemoglobin A1c of 9.5, PAD, retinopathy, hyperlipidemia, chronic kidney disease stage II, anemia, postherpetic neuralgia Electronic Signature(s) Signed: 10/01/2019 6:11:18 PM By: Linton Ham MD Entered By: Linton Ham on 10/01/2019 11:23:19 -------------------------------------------------------------------------------- Physical Exam Details Patient Name: Date of Service: Monique Cabrera 10/01/2019 10:30 AM Medical Record Patient Account Number: [id number] Number: Treating RN: Levan Hurst Date of Birth/Sex: [birth year] (84 y.o. Female) Other Clinician: Primary Care Provider: Hollace Kinnier Treating Provider/Extender:Remee Charley, Legrand Como Referring Provider: Hollace Kinnier Weeks in Treatment: 0 Constitutional Sitting or standing Blood Pressure is within target range for patient.. Pulse regular and within target range for patient.Marland Kitchen Respirations regular, non-labored and within target range.. Temperature is normal and within the target range for the patient.Marland Kitchen Appears in no distress. Eyes Conjunctivae clear. No discharge.no icterus. Respiratory work of breathing is normal. Bilateral breath sounds are clear and equal in all lobes with no wheezes, rales or rhonchi.. Cardiovascular Heart  rhythm and rate regular, without murmur or gallop.. I could not feel femoral or popliteal pulses on the right.. Pedal pulses absent on the right. Lymphatic None palpable in  the right popliteal or inguinal area. Musculoskeletal There is tenderness across the right fifth fourth and third MTPs. Integumentary (Hair, Skin) No primary skin issues were seen. There was surface epithelium removed from the lateral part of the dorsal right foot suggestive of significant cellulitis at one point. Psychiatric appears at normal baseline. Notes Wound exam; very tender area at the base between the fourth and fifth toes on the right. This has a surprising amount of probing depth at greater than 2 cm. Very clear purulent drainage. I do not see gout on her problem list except for the one visit last month with her primary doctor however this does not look like uric acid. It does not probe to bone. Electronic Signature(s) Signed: 10/01/2019 6:11:18 PM By: Linton Ham MD Entered By: Linton Ham on 10/01/2019 11:25:33 -------------------------------------------------------------------------------- Physician Orders Details Patient Name: Date of Service: Monique Cabrera. 10/01/2019 10:30 AM Medical Record Patient Account Number: [id number] Number: Treating RN: Kela Millin Date of Birth/Sex: October 01, 1935 (84 y.o. Female) Other Clinician: Primary Care Provider: Hollace Kinnier Treating Provider/Extender:Hettie Roselli, Legrand Como Referring Provider: Bevely Palmer in Treatment: 0 Verbal / Phone Orders: No Diagnosis Coding Follow-up Appointments Wound #1 Right Toe - Web between 4th and 5th Return Appointment in 1 week. Dressing Change Frequency Wound #1 Right Toe - Web between 4th and 5th Change dressing every day. Wound Cleansing Wound #1 Right Toe - Web between 4th and 5th May shower and wash wound with soap and water. Primary Wound Dressing Wound #1 Right Toe - Web between 4th and  5th Calcium Alginate with Silver - lightly pack Secondary Dressing Wound #1 Right Toe - Web between 4th and 5th Dry Gauze - secure with tape Laboratory Bacteria identified in Unspecified specimen by Anaerobe culture (MICRO) - right 4th and 5th toe web LOINC Code: 672-0 Convenience Name: Anerobic culture Services and Therapies Arterial Studies- Bilateral, with ABI's and TBI's - Dr. Gwenlyn Found. non-healing ulcer right 4th and 5th toe web. abnormal ABI's in clinic Electronic Signature(s) Signed: 10/01/2019 6:11:18 PM By: Linton Ham MD Signed: 10/04/2019 4:29:59 PM By: Kela Millin Entered By: Kela Millin on 10/01/2019 11:09:14 -------------------------------------------------------------------------------- Prescription 10/01/2019 Patient Name: Monique Cabrera Provider: Linton Ham MD Date of Birth: [birth year] NPI#: [id number] Sex: Female DEA#: [id number] Phone #: [phone] License #: [id number] Patient Address: [place] [address] [place], [place] [address] [place],  [address] [phone] Allergies Invokana Reaction: vaginal itching and swelling Severity: Moderate Jardiance Reaction: vaginal itching and swelling Severity: Moderate Provider's Orders Arterial Studies- Bilateral, with ABI's and TBI's - Dr. Gwenlyn Found. non-healing ulcer right 4th and 5th toe web. abnormal ABI's in clinic Signature(s): Date(s): Electronic Signature(s) Signed: 10/01/2019 6:11:18 PM By: Linton Ham MD Signed: 10/04/2019 4:29:59 PM By: Kela Millin Entered By: Kela Millin on 10/01/2019 11:09:15 --------------------------------------------------------------------------------  Problem List Details Patient Name: Date of Service: Monique Cabrera. 10/01/2019 10:30 AM Medical Record Patient Account Number: [id number] Number: Treating RN: Levan Hurst Date of Birth/Sex: 01-Jun-1935  (84 y.o. Female) Other Clinician: Primary Care Provider: Hollace Kinnier Treating Provider/Extender:Lindsi Bayliss, Legrand Como Referring Provider: Bevely Palmer in Treatment: 0 Active Problems ICD-10 Evaluated Encounter Code Description Active Date Today Diagnosis E11.621 Type 2 diabetes mellitus with foot ulcer 10/01/2019 No Yes E11.51 Type 2 diabetes mellitus with diabetic peripheral 10/01/2019 No Yes angiopathy without gangrene L97.518 Non-pressure chronic ulcer of other part of right foot 10/01/2019 No Yes with other specified severity Inactive Problems Resolved Problems Electronic Signature(s)  Signed: 10/01/2019 6:11:18 PM By: Linton Ham MD Entered By: Linton Ham on 10/01/2019 11:18:12 -------------------------------------------------------------------------------- Progress Note Details Patient Name: Date of Service: Monique Cabrera. 10/01/2019 10:30 AM Medical Record Patient Account Number: [id number] Number: Treating RN: Levan Hurst Date of Birth/Sex: August 30, 1935 (84 y.o. Female) Other Clinician: Primary Care Provider: Hollace Kinnier Treating Provider/Extender:Korrin Waterfield, Legrand Como Referring Provider: Bevely Palmer in Treatment: 0 Subjective Chief Complaint Information obtained from Patient 10/01/2019; patient is here for review of a difficult wound in the webspace between the right fourth and fifth toes History of Present Illness (HPI) ADMISSION 10/01/2019 This is an 84 year old woman with type 2 diabetes on insulin. Last hemoglobin A1c 9.5 in July. Her problem started out initially in late August she felt a popping sound she was seen in primary care at [facility] and felt to have gout in the left first metatarsal phalangeal. She was referred to podiatry and found a fracture that they found a fracture of the first metatarsal. They also noted a ulcer in the right fourth webspace roughly 2 to 4 weeks ago. She was seen in the ER with pain and swelling  in the right foot. An x-ray showed no osseous deformity. She was put on Keflex.. During her last appointment with podiatry she was given Iodosorb and his mother placed on the wound. She lives alone and finds this difficult to do. She has just completed a course of Augmentin and doxycycline. This was apparently given to her in the ER on 9/15. The patient also tells me that she has a history of PAD. In fact several years ago was told that she needed a stent in the left leg but she never followed up with this. I am also not clear who she saw. She clearly has claudication with pain in the right ankle when she walks from her apartment to the mailbox in her building forcing her to stop and rest. ABIs in our clinic were at the posterior tibial on the right at 0.78. Past medical history; type 2 diabetes on insulin recent hemoglobin A1c of 9.5, PAD, retinopathy, hyperlipidemia, chronic kidney disease stage II, anemia, postherpetic neuralgia Patient History Information obtained from Patient. Allergies Invokana (Severity: Moderate, Reaction: vaginal itching and swelling), Jardiance (Severity: Moderate, Reaction: vaginal itching and swelling) Family History Cancer - Siblings, Diabetes - Mother,Father,Siblings, Heart Disease - Father, Hypertension - Mother,Father,Siblings, Stroke - Father, Tuberculosis - Father, No family history of Hereditary Spherocytosis, Kidney Disease, Lung Disease, Seizures, Thyroid Problems. Social History Former smoker - quit age 35, Marital Status - Widowed, Alcohol Use - Never, Drug Use - No History, Caffeine Use - Daily - coffee. Medical History Eyes Patient has history of Cataracts - bil removed Denies history of Glaucoma, Optic Neuritis Hematologic/Lymphatic Patient has history of Anemia Denies history of Hemophilia, Human Immunodeficiency Virus, Lymphedema, Sickle Cell Disease Cardiovascular Patient has history of Hypertension, Peripheral Arterial  Disease Endocrine Patient has history of Type II Diabetes Denies history of Type I Diabetes Integumentary (Skin) Denies history of History of Burn Musculoskeletal Denies history of Gout, Rheumatoid Arthritis, Osteoarthritis, Osteomyelitis Neurologic Patient has history of Neuropathy Oncologic Denies history of Received Chemotherapy, Received Radiation Psychiatric Patient has history of Confinement Anxiety Denies history of Anorexia/bulimia Patient is treated with Insulin. Blood sugar is tested. Hospitalization/Surgery History - hysterectomy. - tonsillectomy. - sebateous cyst right wrist. - removal of fatty tumor left foot. Medical And Surgical History Notes Cardiovascular hyperlipidemia Genitourinary CKD stage 2 Review of Systems (ROS) Constitutional Symptoms (General Health) Complains or  has symptoms of Fatigue. Denies complaints or symptoms of Fever, Chills, Marked Weight Change. Eyes Complains or has symptoms of Glasses / Contacts. Denies complaints or symptoms of Dry Eyes, Vision Changes. Ear/Nose/Mouth/Throat Denies complaints or symptoms of Chronic sinus problems or rhinitis. Respiratory Denies complaints or symptoms of Chronic or frequent coughs, Shortness of Breath. Gastrointestinal Denies complaints or symptoms of Frequent diarrhea, Nausea, Vomiting. Genitourinary Denies complaints or symptoms of Frequent urination. Integumentary (Skin) Complains or has symptoms of Wounds - right foot. Musculoskeletal Denies complaints or symptoms of Muscle Pain, Muscle Weakness. Neurologic Complains or has symptoms of Numbness/parasthesias. Psychiatric Complains or has symptoms of Claustrophobia. Objective Constitutional Sitting or standing Blood Pressure is within target range for patient.. Pulse regular and within target range for patient.Marland Kitchen Respirations regular, non-labored and within target range.. Temperature is normal and within the target range for the patient.Marland Kitchen  Appears in no distress. Vitals Time Taken: 10:10 AM, Height: 65 in, Source: Stated, Weight: 138 lbs, Source: Stated, BMI: 23, Temperature: 98.7 F, Pulse: 79 bpm, Respiratory Rate: 18 breaths/min, Blood Pressure: 129/69 mmHg, Capillary Blood Glucose: 114 mg/dl. General Notes: glucose per pt report Eyes Conjunctivae clear. No discharge.no icterus. Respiratory work of breathing is normal. Bilateral breath sounds are clear and equal in all lobes with no wheezes, rales or rhonchi.. Cardiovascular Heart rhythm and rate regular, without murmur or gallop.. I could not feel femoral or popliteal pulses on the right.. Pedal pulses absent on the right. Lymphatic None palpable in the right popliteal or inguinal area. Musculoskeletal There is tenderness across the right fifth fourth and third MTPs. Psychiatric appears at normal baseline. General Notes: Wound exam; very tender area at the base between the fourth and fifth toes on the right. This has a surprising amount of probing depth at greater than 2 cm. Very clear purulent drainage. I do not see gout on her problem list except for the one visit last month with her primary doctor however this does not look like uric acid. It does not probe to bone. Integumentary (Hair, Skin) No primary skin issues were seen. There was surface epithelium removed from the lateral part of the dorsal right foot suggestive of significant cellulitis at one point. Wound #1 status is Open. Original cause of wound was Gradually Appeared. The wound is located on the Right Toe - Web between 4th and 5th. The wound measures 0.4cm length x 0.3cm width x 2.7cm depth; 0.094cm^2 area and 0.254cm^3 volume. There is Fat Layer (Subcutaneous Tissue) Exposed exposed. There is no tunneling noted. There is a medium amount of purulent drainage noted. The wound margin is well defined and not attached to the wound base. There is large (67-100%) pink, pale granulation within the wound bed.  There is no necrotic tissue within the wound bed. Assessment Active Problems ICD-10 Type 2 diabetes mellitus with foot ulcer Type 2 diabetes mellitus with diabetic peripheral angiopathy without gangrene Non-pressure chronic ulcer of other part of right foot with other specified severity Plan Follow-up Appointments: Wound #1 Right Toe - Web between 4th and 5th: Return Appointment in 1 week. Dressing Change Frequency: Wound #1 Right Toe - Web between 4th and 5th: Change dressing every day. Wound Cleansing: Wound #1 Right Toe - Web between 4th and 5th: May shower and wash wound with soap and water. Primary Wound Dressing: Wound #1 Right Toe - Web between 4th and 5th: Calcium Alginate with Silver - lightly pack Secondary Dressing: Wound #1 Right Toe - Web between 4th and 5th: Dry Gauze -  secure with tape Laboratory ordered were: Anerobic culture - right 4th and 5th toe web Services and Therapies ordered were: Arterial Studies- Bilateral, with ABI's and TBI's - Dr. Gwenlyn Found. non-healing ulcer right 4th and 5th toe web. abnormal ABI's in clinic 1. Worrisome wound in the fourth and fifth toes on the right with significant depth and purulent drainage 2. Specimen obtained for culture. She has just come off doxycycline on Augmentin apparently. I will wait to give her further antibiotics 3. I have reviewed x-rays done in the ER on 9/15 and recent x-rays done in podiatry. I do not see any evidence of osteomyelitis or soft tissue gas. She does have vessel calcification however suggestive of PAD 4. I could not feel pulses anywhere on her right foot, popliteal or inguinal. He is going to fairly urgent noninvasive studies. By history she also has short distance claudication on the right side. She tells [place] that at one point she was supposed to have a stent on the left but this was never completed. 5. Cannot rule out having to do an MRI here as well although I did not order this today 6. Strips of  silver alginate change daily. Electronic Signature(s) Signed: 10/01/2019 6:11:18 PM By: Linton Ham MD Entered By: Linton Ham on 10/01/2019 11:28:14 -------------------------------------------------------------------------------- HxROS Details Patient Name: Date of Service: Monique Cabrera. 10/01/2019 10:30 AM Medical Record Patient Account Number: [id number] Number: Treating RN: Baruch Gouty Date of Birth/Sex: [birth year] (84 y.o. Female) Other Clinician: Primary Care Provider: Hollace Kinnier Treating Provider/Extender:Shaquoia Miers, Legrand Como Referring Provider: Bevely Palmer in Treatment: 0 Information Obtained From Patient Constitutional Symptoms (General Health) Complaints and Symptoms: Positive for: Fatigue Negative for: Fever; Chills; Marked Weight Change Eyes Complaints and Symptoms: Positive for: Glasses / Contacts Negative for: Dry Eyes; Vision Changes Medical History: Positive for: Cataracts - bil removed Negative for: Glaucoma; Optic Neuritis Ear/Nose/Mouth/Throat Complaints and Symptoms: Negative for: Chronic sinus problems or rhinitis Respiratory Complaints and Symptoms: Negative for: Chronic or frequent coughs; Shortness of Breath Gastrointestinal Complaints and Symptoms: Negative for: Frequent diarrhea; Nausea; Vomiting Genitourinary Complaints and Symptoms: Negative for: Frequent urination Medical History: Past Medical History Notes: CKD stage 2 Integumentary (Skin) Complaints and Symptoms: Positive for: Wounds - right foot Medical History: Negative for: History of Burn Musculoskeletal Complaints and Symptoms: Negative for: Muscle Pain; Muscle Weakness Medical History: Negative for: Gout; Rheumatoid Arthritis; Osteoarthritis; Osteomyelitis Neurologic Complaints and Symptoms: Positive for: Numbness/parasthesias Medical History: Positive for: Neuropathy Psychiatric Complaints and Symptoms: Positive for:  Claustrophobia Medical History: Positive for: Confinement Anxiety Negative for: Anorexia/bulimia Hematologic/Lymphatic Medical History: Positive for: Anemia Negative for: Hemophilia; Human Immunodeficiency Virus; Lymphedema; Sickle Cell Disease Cardiovascular Medical History: Positive for: Hypertension; Peripheral Arterial Disease Past Medical History Notes: hyperlipidemia Endocrine Medical History: Positive for: Type II Diabetes Negative for: Type I Diabetes Time with diabetes: age 30 Treated with: Insulin Blood sugar tested every day: Yes Tested : 2-3 times per day Immunological Oncologic Medical History: Negative for: Received Chemotherapy; Received Radiation HBO Extended History Items Eyes: Cataracts Immunizations Pneumococcal Vaccine: Received Pneumococcal Vaccination: Yes Implantable Devices None Hospitalization / Surgery History Type of Hospitalization/Surgery hysterectomy tonsillectomy sebateous cyst right wrist removal of fatty tumor left foot Family and Social History Cancer: Yes - Siblings; Diabetes: Yes - Mother,Father,Siblings; Heart Disease: Yes - Father; Hereditary Spherocytosis: No; Hypertension: Yes - Mother,Father,Siblings; Kidney Disease: No; Lung Disease: No; Seizures: No; Stroke: Yes - Father; Thyroid Problems: No; Tuberculosis: Yes - Father; Former smoker - quit age 78; Marital Status - Widowed; Alcohol Use:  Never; Drug Use: No History; Caffeine Use: Daily - coffee; Financial Concerns: No; Food, Clothing or Shelter Needs: No; Support System Lacking: No; Transportation Concerns: No Electronic Signature(s) Signed: 10/01/2019 6:11:18 PM By: Linton Ham MD Signed: 10/02/2019 5:55:43 PM By: Baruch Gouty RN, BSN Entered By: Baruch Gouty on 10/01/2019 10:26:03 -------------------------------------------------------------------------------- SuperBill Details Patient Name: Date of Service: Monique Cabrera 10/01/2019 Medical Record  [id number]:[id number] Patient Account Number: [id number] Date of Birth/Sex: Treating RN: [birth year] (84 y.o. Female) Kela Millin Primary Care Provider: Hollace Kinnier Other Clinician: Referring Provider: Treating Provider/Extender:Madalina Rosman, Arlyss Queen, Lindell Spar in Treatment: 0 Diagnosis Coding ICD-10 Codes Code Description E11.621 Type 2 diabetes mellitus with foot ulcer E11.51 Type 2 diabetes mellitus with diabetic peripheral angiopathy without gangrene L97.518 Non-pressure chronic ulcer of other part of right foot with other specified severity Facility Procedures CPT4 Code: [id number] Description: 99214 - WOUND CARE VISIT-LEV 4 EST PT Modifier: Quantity: 1 Physician Procedures CPT4 Code Description: [id number] 19509 - WC PHYS LEVEL 4 - NEW PT ICD-10 Diagnosis Description E11.621 Type 2 diabetes mellitus with foot ulcer E11.51 Type 2 diabetes mellitus with diabetic peripheral angiopa L97.518 Non-pressure chronic ulcer of other  part of right foot wi Modifier: thy without gang th other specifi Quantity: 1 rene ed severity Electronic Signature(s) Signed: 10/01/2019 6:11:18 PM By: Linton Ham MD Entered By: Linton Ham on 10/01/2019 11:28:43

## 2019-10-04 NOTE — ED Triage Notes (Signed)
EMS called by family for AMS. Pt has hx of diabetes, took insulin and did not eat lunch. On arrival EMS reported pt became unresponsive, CBG read 35, gave 15 mg oral glucose, CBG came up to 40. EMS gave 25 g of D10. EMS most recent CBG was 300. Pt a/o x4. Reports no pain

## 2019-10-04 NOTE — ED Notes (Signed)
Discharge instructions discussed with pt and family at bedside. Pt verbalized understanding with no questions at this time.   Signature pad not available

## 2019-10-05 ENCOUNTER — Telehealth: Payer: Self-pay | Admitting: *Deleted

## 2019-10-05 NOTE — Telephone Encounter (Addendum)
TOC CM received call from [facility] and they are not able to accept referral. Sent referral to [facility]. Waiting confirmation.Jonnie Finner RN CCM, [facility] ED TOC CM [phone]  330 pm [facility] unable to accept referral. Contacted [facility] with referral. Waiting confirmation. Jonnie Finner RN CCM, [facility] ED TOC CM [phone]  5:30 pm Received call from [facility] and they cannot accept referral due to staffing. [place], [place] ED TOC CM [phone]

## 2019-10-06 ENCOUNTER — Other Ambulatory Visit: Payer: Self-pay | Admitting: Internal Medicine

## 2019-10-06 ENCOUNTER — Telehealth: Payer: Self-pay | Admitting: *Deleted

## 2019-10-06 DIAGNOSIS — M109 Gout, unspecified: Secondary | ICD-10-CM

## 2019-10-06 LAB — AEROBIC CULTURE W GRAM STAIN (SUPERFICIAL SPECIMEN)

## 2019-10-06 NOTE — Telephone Encounter (Signed)
TOC CM contacted [facility] with new referral and they are able to accept referral for HHRN/PT. Spoke to pt and updated her on [name]. States she has been doing the dressing changes and she has been to wound care. States her son and daughters help with her meals. She uses her cane and crutches in the home.  [place], [place] ED TOC CM [phone]

## 2019-10-08 ENCOUNTER — Other Ambulatory Visit: Payer: Self-pay | Admitting: Internal Medicine

## 2019-10-08 ENCOUNTER — Encounter (HOSPITAL_BASED_OUTPATIENT_CLINIC_OR_DEPARTMENT_OTHER): Payer: Medicare Other | Admitting: Internal Medicine

## 2019-10-08 ENCOUNTER — Other Ambulatory Visit: Payer: Self-pay

## 2019-10-08 ENCOUNTER — Ambulatory Visit: Payer: Medicare Other | Admitting: Family

## 2019-10-08 DIAGNOSIS — E1122 Type 2 diabetes mellitus with diabetic chronic kidney disease: Secondary | ICD-10-CM | POA: Diagnosis not present

## 2019-10-08 DIAGNOSIS — M79604 Pain in right leg: Secondary | ICD-10-CM | POA: Diagnosis not present

## 2019-10-08 DIAGNOSIS — L089 Local infection of the skin and subcutaneous tissue, unspecified: Secondary | ICD-10-CM | POA: Diagnosis not present

## 2019-10-08 DIAGNOSIS — I129 Hypertensive chronic kidney disease with stage 1 through stage 4 chronic kidney disease, or unspecified chronic kidney disease: Secondary | ICD-10-CM | POA: Diagnosis not present

## 2019-10-08 DIAGNOSIS — L97519 Non-pressure chronic ulcer of other part of right foot with unspecified severity: Secondary | ICD-10-CM | POA: Diagnosis not present

## 2019-10-08 DIAGNOSIS — N182 Chronic kidney disease, stage 2 (mild): Secondary | ICD-10-CM | POA: Diagnosis not present

## 2019-10-08 DIAGNOSIS — E1151 Type 2 diabetes mellitus with diabetic peripheral angiopathy without gangrene: Secondary | ICD-10-CM | POA: Diagnosis not present

## 2019-10-08 DIAGNOSIS — E1140 Type 2 diabetes mellitus with diabetic neuropathy, unspecified: Secondary | ICD-10-CM | POA: Diagnosis not present

## 2019-10-08 DIAGNOSIS — E11319 Type 2 diabetes mellitus with unspecified diabetic retinopathy without macular edema: Secondary | ICD-10-CM | POA: Diagnosis not present

## 2019-10-08 DIAGNOSIS — IMO0002 Reserved for concepts with insufficient information to code with codable children: Secondary | ICD-10-CM

## 2019-10-08 DIAGNOSIS — B9689 Other specified bacterial agents as the cause of diseases classified elsewhere: Secondary | ICD-10-CM | POA: Diagnosis not present

## 2019-10-08 DIAGNOSIS — E11621 Type 2 diabetes mellitus with foot ulcer: Secondary | ICD-10-CM | POA: Diagnosis not present

## 2019-10-08 DIAGNOSIS — L97512 Non-pressure chronic ulcer of other part of right foot with fat layer exposed: Secondary | ICD-10-CM | POA: Diagnosis not present

## 2019-10-08 DIAGNOSIS — Z794 Long term (current) use of insulin: Secondary | ICD-10-CM | POA: Diagnosis not present

## 2019-10-08 NOTE — Progress Notes (Signed)
Monique Cabrera ([id number]) Visit Report for 10/08/2019 HPI Details Patient Name: Date of Service: Monique Cabrera, Monique Cabrera 10/08/2019 1:45 PM Medical Record [id number] Patient Account Number: [id number] Date of Birth/Sex: Treating RN: 1935-03-11 (84 y.o. Monique Cabrera Primary Care Provider: Hollace Cabrera Other Clinician: Referring Provider: Treating Provider/Extender:Monique Cabrera, Monique Cabrera, Monique Cabrera in Treatment: 1 History of Present Illness HPI Description: ADMISSION 10/01/2019 This is an 84 year old woman with type 2 diabetes on insulin. Last hemoglobin A1c 9.5 in July. Her problem started out initially in late August she felt a popping sound she was seen in primary care at [facility] and felt to have gout in the left first metatarsal phalangeal. She was referred to podiatry and found a fracture that they found a fracture of the first metatarsal. They also noted a ulcer in the right fourth webspace roughly 2 to 4 Cabrera ago. She was seen in the ER with pain and swelling in the right foot. An x-ray showed no osseous deformity. She was put on Keflex.. During her last appointment with podiatry she was given Iodosorb and his mother placed on the wound. She lives alone and finds this difficult to do. She has just completed a course of Augmentin and doxycycline. This was apparently given to her in the ER on 9/15. The patient also tells me that she has a history of PAD. In fact several years ago was told that she needed a stent in the left leg but she never followed up with this. I am also not clear who she saw. She clearly has claudication with pain in the right ankle when she walks from her apartment to the mailbox in her building forcing her to stop and rest. ABIs in our clinic were at the posterior tibial on the right at 0.78. Past medical history; type 2 diabetes on insulin recent hemoglobin A1c of 9.5, PAD, retinopathy, hyperlipidemia, chronic kidney disease stage II,  anemia, postherpetic neuralgia 10/12; patient complains of more pain in the right leg that is keeping her up at night. She is not complaining of pain in the left foot however.foot is in a fracture boot. Culture I did of the wound between her fourth and fifth toes last week showed Serratia and a organism I am not familiar with cold and Pentoea. I will start her on ciprofloxacin today. She was in the ER over the weekend and x-ray of the foot did not show osteomyelitis Electronic Signature(s) Signed: 10/08/2019 5:50:55 PM By: Monique Ham MD Entered By: Monique Cabrera on 10/08/2019 14:45:17 -------------------------------------------------------------------------------- Physical Exam Details Patient Name: Date of Service: Monique Cabrera 10/08/2019 1:45 PM Medical Record [id number] Patient Account Number: [id number] Date of Birth/Sex: Treating RN: 27-Nov-1935 (84 y.o. Monique Cabrera Primary Care Provider: Hollace Cabrera Other Clinician: Referring Provider: Treating Provider/Extender:Monique Cabrera, Monique Cabrera, Monique Cabrera in Treatment: 1 Constitutional Sitting or standing Blood Pressure is within target range for patient.. Pulse regular and within target range for patient.Marland Kitchen Respirations regular, non-labored and within target range.. Temperature is normal and within the target range for the patient.Marland Kitchen Appears in no distress. Cardiovascular I cannot feel her posterior tibial or dorsalis pedis pulses. Popliteal pulses not palpable. I cannot feel a femoral pulse on the right. Notes Wound exam; very tender area between her fourth and fifth toes on the right. I can barely get near this to look at it today. There appears to be some drainage. Also some tenderness over her metatarsal heads but truthfully her entire forefoot is painful. It is also cool  Electronic Signature(s) Signed: 10/08/2019 5:50:55 PM By: Monique Ham MD Entered By: Monique Cabrera on 10/08/2019  14:46:51 -------------------------------------------------------------------------------- Physician Orders Details Patient Name: Date of Service: Monique Cabrera 10/08/2019 1:45 PM Medical Record [id number] Patient Account Number: [id number] Date of Birth/Sex: Treating RN: March 22, 1935 (84 y.o. Monique Cabrera Primary Care Provider: Hollace Cabrera Other Clinician: Referring Provider: Treating Provider/Extender:Monique Cabrera, Monique Cabrera, Monique Cabrera in Treatment: 1 Verbal / Phone Orders: No Diagnosis Coding Follow-up Appointments Wound #1 Right Toe - Web between 4th and 5th Return Appointment in 1 week. Dressing Change Frequency Wound #1 Right Toe - Web between 4th and 5th Change dressing every day. Wound Cleansing Wound #1 Right Toe - Web between 4th and 5th May shower and wash wound with soap and water. Primary Wound Dressing Wound #1 Right Toe - Web between 4th and 5th Calcium Alginate with Silver - lightly pack Secondary Dressing Wound #1 Right Toe - Web between 4th and 5th Dry Gauze - secure with tape Patient Medications Allergies: Invokana, Jardiance Notifications Medication Indication Start End Cipro 10/08/2019 DOSE oral 500 mg tablet - 1 tablet oral bid for 7 days Electronic Signature(s) Signed: 10/08/2019 2:51:14 PM By: Monique Ham MD Entered By: Monique Cabrera on 10/08/2019 14:51:13 -------------------------------------------------------------------------------- Problem List Details Patient Name: Date of Service: Monique Cabrera 10/08/2019 1:45 PM Medical Record [id number] Patient Account Number: [id number] Date of Birth/Sex: Treating RN: 08/03/1935 (84 y.o. Monique Cabrera Primary Care Provider: Hollace Cabrera Other Clinician: Referring Provider: Treating Provider/Extender:Monique Cabrera, Monique Cabrera, Monique Cabrera in Treatment: 1 Active Problems ICD-10 Evaluated Encounter Code Description Active Date Today Diagnosis E11.621 Type 2  diabetes mellitus with foot ulcer 10/01/2019 No Yes E11.51 Type 2 diabetes mellitus with diabetic peripheral 10/01/2019 No Yes angiopathy without gangrene L97.518 Non-pressure chronic ulcer of other part of right foot 10/01/2019 No Yes with other specified severity Inactive Problems Resolved Problems Electronic Signature(s) Signed: 10/08/2019 5:50:55 PM By: Monique Ham MD Entered By: Monique Cabrera on 10/08/2019 14:42:33 -------------------------------------------------------------------------------- Progress Note Details Patient Name: Date of Service: Monique Cabrera 10/08/2019 1:45 PM Medical Record [id number] Patient Account Number: [id number] Date of Birth/Sex: Treating RN: [birth year] (84 y.o. Monique Cabrera Primary Care Provider: Hollace Cabrera Other Clinician: Referring Provider: Treating Provider/Extender:Charline Hoskinson, Monique Cabrera, Monique Cabrera in Treatment: 1 Subjective History of Present Illness (HPI) ADMISSION 10/01/2019 This is an 84 year old woman with type 2 diabetes on insulin. Last hemoglobin A1c 9.5 in July. Her problem started out initially in late August she felt a popping sound she was seen in primary care at [facility] and felt to have gout in the left first metatarsal phalangeal. She was referred to podiatry and found a fracture that they found a fracture of the first metatarsal. They also noted a ulcer in the right fourth webspace roughly 2 to 4 Cabrera ago. She was seen in the ER with pain and swelling in the right foot. An x-ray showed no osseous deformity. She was put on Keflex.. During her last appointment with podiatry she was given Iodosorb and his mother placed on the wound. She lives alone and finds this difficult to do. She has just completed a course of Augmentin and doxycycline. This was apparently given to her in the ER on 9/15. The patient also tells me that she has a history of PAD. In fact several years ago was told that she  needed a stent in the left leg but she never followed up with this. I am also not clear who she saw. She clearly has  claudication with pain in the right ankle when she walks from her apartment to the mailbox in her building forcing her to stop and rest. ABIs in our clinic were at the posterior tibial on the right at 0.78. Past medical history; type 2 diabetes on insulin recent hemoglobin A1c of 9.5, PAD, retinopathy, hyperlipidemia, chronic kidney disease stage II, anemia, postherpetic neuralgia 10/12; patient complains of more pain in the right leg that is keeping her up at night. She is not complaining of pain in the left foot however.foot is in a fracture boot. Culture I did of the wound between her fourth and fifth toes last week showed Serratia and a organism I am not familiar with cold and Pentoea. I will start her on ciprofloxacin today. She was in the ER over the weekend and x-ray of the foot did not show osteomyelitis Objective Constitutional Sitting or standing Blood Pressure is within target range for patient.. Pulse regular and within target range for patient.Marland Kitchen Respirations regular, non-labored and within target range.. Temperature is normal and within the target range for the patient.Marland Kitchen Appears in no distress. Vitals Time Taken: 1:50 PM, Height: 65 in, Weight: 138 lbs, BMI: 23, Temperature: 99.1 F, Pulse: 90 bpm, Respiratory Rate: 18 breaths/min, Blood Pressure: 142/89 mmHg. Cardiovascular I cannot feel her posterior tibial or dorsalis pedis pulses. Popliteal pulses not palpable. I cannot feel a femoral pulse on the right. General Notes: Wound exam; very tender area between her fourth and fifth toes on the right. I can barely get near this to look at it today. There appears to be some drainage. Also some tenderness over her metatarsal heads but truthfully her entire forefoot is painful. It is also cool Integumentary (Hair, Skin) Wound #1 status is Open. Original cause of wound  was Gradually Appeared. The wound is located on the Right Toe - Web between 4th and 5th. The wound measures 0.4cm length x 0.3cm width x 2.3cm depth; 0.094cm^2 area and 0.217cm^3 volume. There is Fat Layer (Subcutaneous Tissue) Exposed exposed. There is no tunneling or undermining noted. There is a medium amount of serosanguineous drainage noted. The wound margin is well defined and not attached to the wound base. There is large (67-100%) pink, pale granulation within the wound bed. There is no necrotic tissue within the wound bed. Assessment Active Problems ICD-10 Type 2 diabetes mellitus with foot ulcer Type 2 diabetes mellitus with diabetic peripheral angiopathy without gangrene Non-pressure chronic ulcer of other part of right foot with other specified severity Plan Follow-up Appointments: Wound #1 Right Toe - Web between 4th and 5th: Return Appointment in 1 week. Dressing Change Frequency: Wound #1 Right Toe - Web between 4th and 5th: Change dressing every day. Wound Cleansing: Wound #1 Right Toe - Web between 4th and 5th: May shower and wash wound with soap and water. Primary Wound Dressing: Wound #1 Right Toe - Web between 4th and 5th: Calcium Alginate with Silver - lightly pack Secondary Dressing: Wound #1 Right Toe - Web between 4th and 5th: Dry Gauze - secure with tape The following medication(s) was prescribed: Cipro oral 500 mg tablet 1 tablet oral bid for 7 days starting 10/08/2019 1. I am going to continue with silver alginate although I cannot see how this patient would manage to get this even close to the opening between her fourth and fifth toes 2. Unfortunately we did not have anything from our vascular appointment from last week. Our staff is checking on this. 3. She had an x-ray done  on 10/8 that did not show osteomyelitis however I cannot rule out an MRI. 4. The patient very well might have peripheral neuropathy although her pain is almost exclusively on the  right not the left 5. Ciprofloxacin 500 twice daily for 7 days of the wound infection possibly with Serratia Electronic Signature(s) Signed: 10/08/2019 5:50:55 PM By: Monique Ham MD Entered By: Monique Cabrera on 10/08/2019 14:51:38 -------------------------------------------------------------------------------- SuperBill Details Patient Name: Date of Service: Monique Cabrera 10/08/2019 Medical Record [id number] Patient Account Number: [id number] Date of Birth/Sex: Treating RN: 1935-10-20 (84 y.o. Monique Cabrera Primary Care Provider: Hollace Cabrera Other Clinician: Referring Provider: Treating Provider/Extender:Tyrail Grandfield, Monique Cabrera, Monique Cabrera in Treatment: 1 Diagnosis Coding ICD-10 Codes Code Description E11.621 Type 2 diabetes mellitus with foot ulcer E11.51 Type 2 diabetes mellitus with diabetic peripheral angiopathy without gangrene L97.518 Non-pressure chronic ulcer of other part of right foot with other specified severity Facility Procedures CPT4 Code: [id number] Description: 99213 - WOUND CARE VISIT-LEV 3 EST PT Modifier: Quantity: 1 Physician Procedures CPT4 Code Description: [id number] [place] - WC PHYS LEVEL 3 - EST PT ICD-10 Diagnosis Description E11.621 Type 2 diabetes mellitus with foot ulcer E11.51 Type 2 diabetes mellitus with diabetic peripheral angiopathy Modifier: without gangr Quantity: 1 ene Electronic Signature(s) Signed: 10/08/2019 5:50:55 PM By: Monique Ham MD Entered By: Monique Cabrera on 10/08/2019 14:51:54

## 2019-10-09 ENCOUNTER — Encounter: Payer: Self-pay | Admitting: Family

## 2019-10-09 ENCOUNTER — Ambulatory Visit (INDEPENDENT_AMBULATORY_CARE_PROVIDER_SITE_OTHER): Payer: Medicare Other | Admitting: Family

## 2019-10-09 VITALS — BP 124/70 | HR 96 | Temp 97.1°F | Ht 65.0 in | Wt 139.6 lb

## 2019-10-09 DIAGNOSIS — E1165 Type 2 diabetes mellitus with hyperglycemia: Secondary | ICD-10-CM | POA: Diagnosis not present

## 2019-10-09 DIAGNOSIS — E1129 Type 2 diabetes mellitus with other diabetic kidney complication: Secondary | ICD-10-CM | POA: Diagnosis not present

## 2019-10-09 DIAGNOSIS — S81801D Unspecified open wound, right lower leg, subsequent encounter: Secondary | ICD-10-CM

## 2019-10-09 DIAGNOSIS — M79671 Pain in right foot: Secondary | ICD-10-CM

## 2019-10-09 DIAGNOSIS — IMO0002 Reserved for concepts with insufficient information to code with codable children: Secondary | ICD-10-CM

## 2019-10-09 MED ORDER — INSULIN GLARGINE 100 UNIT/ML SOLOSTAR PEN: PEN_INJECTOR | SUBCUTANEOUS | 1 refills | Status: DC

## 2019-10-09 NOTE — Progress Notes (Signed)
Provider: Breeana Sawtelle FNP-C  [name], DO  Patient Care Team: [name], DO as PCP - General (Geriatric Medicine) Adrian Prows, MD as Consulting Physician (Cardiology) Lazaro Arms, RN as [place] Management Rutherford Guys, MD as Consulting Physician (Ophthalmology) Justin Mend, MD as Attending Physician (Internal Medicine)  Extended Emergency Contact Information Primary Emergency Contact: ASNKNLZ,JQBH Address: [address]          [place], [place] [address] Johnnette Litter of [place] Phone: [phone] Relation: Daughter Secondary Emergency Contact: Kimberlee Nearing [place] Phone: [phone] Relation: Daughter  Code Status:  Goals of care: Advanced Directive information Advanced Directives 10/04/2019  Does Patient Have a Medical Advance Directive? No  Type of Advance Directive -  Does patient want to make changes to medical advance directive? -  Would patient like information on creating a medical advance directive? No - Patient declined  Pre-existing out of facility DNR order (yellow form or pink MOST form) -     Chief Complaint  Patient presents with  . Medical Management of Chronic Issues    2 week follow up patient states she is concerned with healing she states she feels her feet should be healing and not healing in timely manner     HPI:  Pt is a 84 y.o. female seen today at [facility] office for an acute visit for 2 weeks follow up for right foot wound and pain.she states continues to follow up with wound center last seen 10/08/2019.Cipro 500 mg tablet twice daily was prescribed.she states previous ordered home health Nurse will be coming tomorrow for right foot wound care management.shedenies any increased drainage,odor,fever or chills.tylenol  1000 mg tablet twice daily and 500 mg tablet at 2 pm daily ordered for patient on 10/03/2019 visit but states picked up medication  from pharmacy yesterday has not taken it.   She was in the ED 10/04/2019 due to unresponsive episode associated with hypoglycemia thought due to not eating with administration of insulin.she states her CBG was in the 40's-50's.CBG readings improved to 232 after EMS administered glucagon and D10 Her Lab work was unremarkable no signs of infection.CXR and right foot  X-ray was negative.Urien analysis showed mild glucosuria,few bacteria,negative nitrites and Leukocytes.  She also had Bradycardia.EKG showed Sinus bradycardia HR 49 possible LVH,no ST-T changes.   Past Medical History:  Diagnosis Date  . Acute upper respiratory infections of unspecified site   . Anemia   . Anemia, unspecified   . Atherosclerosis of native arteries of the extremities, unspecified   . Chest pain, unspecified   . Chronic kidney disease (CKD), stage II (mild)   . Diabetes mellitus   . Diarrhea   . Disorder of bone and cartilage, unspecified   . DM (diabetes mellitus) type II controlled with renal manifestation ([place])   . Herpes zoster with other nervous system complications(053.19)   . Hypercalcemia   . Hypertension   . Hypertensive renal disease, benign   . Nonspecific reaction to tuberculin skin test without active tuberculosis(795.51)   . Nonspecific tuberculin test reaction   . Other and unspecified hyperlipidemia   . Pain in joint, lower leg   . Peripheral arterial disease ([place])   . Postherpetic neuralgia   . Proteinuria   . Stroke ([place]) 01/2017  . Type II or unspecified type diabetes mellitus with renal manifestations, not stated as uncontrolled(250.40)   . Type II or unspecified type diabetes mellitus with renal manifestations, uncontrolled(250.42)   . Unspecified  disorder of kidney and ureter   . Unspecified essential hypertension    Past Surgical History:  Procedure Laterality Date  . hysterectomy    . INCISION AND DRAINAGE Left 05/27/14   sebacous cyst, ear  . PRP Left    Dr. Ricki Miller  . removal of cyst from hand    . removal of tumor  from foot    . TONSILLECTOMY      Allergies  Allergen Reactions  . Invokana [Canagliflozin] Other (See Comments)    Vagina itching and swelling Vaginal Itching and irritation   . Jardiance [Empagliflozin] Itching and Other (See Comments)    Vaginal itching and swelling    Outpatient Encounter Medications as of 10/09/2019  Medication Sig  . acetaminophen (TYLENOL) 500 MG tablet Take 2 tablets (1,000 mg total) by mouth 2 (two) times daily. And 500 mg tablet daily at 2 pm  . amLODipine (NORVASC) 10 MG tablet TAKE 1 TABLET (10 MG TOTAL) BY MOUTH DAILY. FOR HIGH BLOOD PRESSURE  . atorvastatin (LIPITOR) 20 MG tablet TAKE 1 TABLET BY MOUTH EVERY DAY  . carvedilol (COREG) 25 MG tablet TAKE 1 TABLET BY MOUTH TWICE A DAY WITH A MEAL  . ciprofloxacin (CIPRO) 500 MG tablet Take 500 mg by mouth 2 (two) times daily.  . clopidogrel (PLAVIX) 75 MG tablet TAKE ONE TABLET BY MOUTH ONCE DAILY  . Insulin Glargine (LANTUS) 100 UNIT/ML Solostar Pen INJECT 50 UNITS UNDER THE SKIN DAILY FOR DIABETES  . insulin lispro (HUMALOG KWIKPEN) 100 UNIT/ML KwikPen INJECT 6 UNITS BREAKFAST, 6 UNITS LUNCH & SUPPER  . Insulin Pen Needle (B-D ULTRAFINE III SHORT PEN) 31G X 8 MM MISC Use to check blood sugar every day. Dx: 11.29; 11.65  . ONETOUCH VERIO test strip USE TO TEST BLOOD SUGAR THREE TIMES DAILY. DX: E11.9  . valsartan-hydrochlorothiazide (DIOVAN-HCT) 320-25 MG tablet TAKE 1 TABLET BY MOUTH EVERY DAY   No facility-administered encounter medications on file as of 10/09/2019.     Review of Systems  Constitutional: Negative for appetite change, chills, fatigue and fever.  HENT: Negative for congestion, rhinorrhea, sinus pressure, sinus pain, sneezing and sore throat.   Respiratory: Negative for cough, chest tightness, shortness of breath and wheezing.   Cardiovascular: Negative for chest pain, palpitations and leg swelling.  Gastrointestinal: Negative for abdominal distention, abdominal pain, constipation,  diarrhea, nausea and vomiting.  Musculoskeletal: Positive for arthralgias and gait problem.  Skin: Positive for wound. Negative for color change, pallor and rash.       Right foot wound per HPI   Neurological: Negative for dizziness, weakness, light-headedness, numbness and headaches.  Psychiatric/Behavioral: Negative for agitation, confusion and sleep disturbance. The patient is not nervous/anxious.     Immunization History  Administered Date(s) Administered  . Fluad Quad(high Dose 65+) 08/23/2019  . Influenza, High Dose Seasonal PF 09/30/2018  . Influenza,inj,Quad PF,6+ Mos 09/24/2013, 09/19/2014, 09/13/2016  . Influenza-Unspecified 11/22/2009, 10/01/2011, 09/10/2015, 09/26/2017  . PPD Test 01/12/2010  . Pneumococcal Conjugate-13 11/24/2015  . Pneumococcal Polysaccharide-23 12/27/2005   Pertinent  Health Maintenance Due  Topic Date Due  . OPHTHALMOLOGY EXAM  01/26/2019  . FOOT EXAM  02/06/2019  . HEMOGLOBIN A1C  12/28/2019  . INFLUENZA VACCINE  Completed  . DEXA SCAN  Completed  . PNA vac Low Risk Adult  Completed   Fall Risk  10/09/2019 10/03/2019 09/27/2019 09/20/2019 09/17/2019  Falls in the past year? 0 0 0 0 0  Number falls in past yr: 0 0 0 - 0  Comment - - - - -  Injury with Fall? 0 0 0 - -  Risk for fall due to : - - - - -  Follow up - - - - -    Vitals:   10/09/19 1009  BP: 124/70  Pulse: 96  Temp: (!) 97.1 F (36.2 C)  TempSrc: Temporal  SpO2: 98%  Weight: 139 lb 9.6 oz (63.3 kg)  Height: 5\' 5"  (1.651 m)   Body mass index is 23.23 kg/m. Physical Exam Constitutional:      General: She is not in acute distress.    Appearance: She is normal weight. She is not ill-appearing.  Eyes:     General: No scleral icterus.       Right eye: No discharge.        Left eye: No discharge.     Extraocular Movements: Extraocular movements intact.     Conjunctiva/sclera: Conjunctivae normal.     Pupils: Pupils are equal, round, and reactive to light.     Comments:  Corrective lens in place   Neck:     Musculoskeletal: Normal range of motion. No neck rigidity or muscular tenderness.     Vascular: No carotid bruit.  Cardiovascular:     Rate and Rhythm: Normal rate and regular rhythm.     Pulses: Normal pulses.     Heart sounds: Normal heart sounds. No murmur. No friction rub. No gallop.   Pulmonary:     Effort: Pulmonary effort is normal. No respiratory distress.     Breath sounds: Normal breath sounds. No wheezing, rhonchi or rales.  Chest:     Chest wall: No tenderness.  Abdominal:     General: Bowel sounds are normal. There is no distension.     Palpations: Abdomen is soft. There is no mass.     Tenderness: There is no abdominal tenderness. There is no right CVA tenderness, left CVA tenderness, guarding or rebound.  Musculoskeletal:        General: No swelling or tenderness.     Right lower leg: No edema.     Left lower leg: No edema.     Comments: Ambulates using bilateral crutches.  Lymphadenopathy:     Cervical: No cervical adenopathy.  Skin:    General: Skin is warm and dry.     Coloration: Skin is not pale.     Findings: No bruising, erythema or rash.     Comments: Right foot 4th and 5 th web ulcer with small amounts serosanguinous drainage noted on patient's sock.Dorsal and bottom of the foot continues to be tender to touch. No redness or odor noted.dorsal foot dark scab area noted.wound cleansed with saline,pat dry.packing not removed this visit.No packing material to change dressing.    Neurological:     Mental Status: She is alert and oriented to person, place, and time.     Cranial Nerves: No cranial nerve deficit.     Sensory: No sensory deficit.     Motor: No weakness.     Gait: Gait abnormal.  Psychiatric:        Mood and Affect: Mood normal.        Behavior: Behavior normal.        Thought Content: Thought content normal.        Judgment: Judgment normal.    Labs reviewed: Recent Labs    09/11/19 1136 09/17/19 1518  10/04/19 1739  NA 136 134* 133*  K 3.9 3.8 3.9  CL 104 98 100  CO2 20* 24 22  GLUCOSE 135*  226* 215*  BUN 32* 38* 18  CREATININE 1.76* 1.71* 1.42*  CALCIUM 10.3 10.6* 9.5   Recent Labs    06/27/19 0835 09/11/19 1136 09/17/19 1518  AST 20 18 13   ALT 20 13 8   ALKPHOS  --  59  --   BILITOT 0.5 0.6 0.4  PROT 6.5 6.8 6.7  ALBUMIN  --  3.4*  --    Recent Labs    09/11/19 1136 09/17/19 1518 10/04/19 1739  WBC 9.5 12.1* 9.6  NEUTROABS 5.8 7,974* 7.1  HGB 10.9* 10.6* 9.3*  HCT 33.2* 31.4* 29.2*  MCV 95.1 91.8 94.5  PLT 275 366 236   No results found for: TSH Lab Results  Component Value Date   HGBA1C 9.5 (H) 06/27/2019   Lab Results  Component Value Date   CHOL 121 02/07/2019   HDL 48 (L) 02/07/2019   LDLCALC 60 02/07/2019   TRIG 53 02/07/2019   CHOLHDL 2.5 02/07/2019    Significant Diagnostic Results in last 30 days:  Dg Chest 2 View  Result Date: 09/11/2019 CLINICAL DATA:  Infection. EXAM: CHEST - 2 VIEW COMPARISON:  Radiographs of February 17, 2017. FINDINGS: The heart size and mediastinal contours are within normal limits. Both lungs are clear. Atherosclerosis of thoracic aorta is noted. The visualized skeletal structures are unremarkable. IMPRESSION: No active cardiopulmonary disease. Aortic Atherosclerosis (ICD10-I70.0). Electronically Signed   By: Marijo Conception M.D.   On: 09/11/2019 12:13   Dg Chest Portable 1 View  Result Date: 10/04/2019 CLINICAL DATA:  Syncope. EXAM: PORTABLE CHEST 1 VIEW COMPARISON:  Chest radiograph 09/11/2019 FINDINGS: Monitoring leads overlie the patient. Stable cardiac and mediastinal contours. Aortic atherosclerosis. Left-greater-than-right basilar heterogeneous opacities. No large area pulmonary consolidation. No pleural effusion or pneumothorax. Thoracic spine degenerative changes. IMPRESSION: Minimal left greater right basilar opacities favored represent atelectasis. Infection not excluded. Electronically Signed   By: Lovey Newcomer  M.D.   On: 10/04/2019 18:29   Dg Foot Complete Left  Result Date: 09/26/2019 Please see detailed radiograph report in office note.  Dg Foot Complete Right  Result Date: 10/04/2019 CLINICAL DATA:  Diabetic foot ulcer right foot between fourth and fifth toes. Evaluate for osteomyelitis. EXAM: RIGHT FOOT COMPLETE - 3+ VIEW COMPARISON:  09/26/2019 and 09/11/2019 FINDINGS: Mild hallux valgus deformity with degenerative changes at the first MTP joint. Old distal fifth metatarsal fracture. No definite focal bone destruction to suggest osteomyelitis. No air within the soft tissues. Small vessel atherosclerotic disease is present. IMPRESSION: No acute findings. Electronically Signed   By: Marin Olp M.D.   On: 10/04/2019 19:12   Dg Foot Complete Right  Result Date: 09/26/2019 Please see detailed radiograph report in office note.  Dg Foot Complete Right  Result Date: 09/11/2019 CLINICAL DATA:  Infection. EXAM: RIGHT FOOT COMPLETE - 3+ VIEW COMPARISON:  09/06/2019. FINDINGS: Diffuse osteopenia and degenerative change. Old healed angulated fracture of the distal right fifth metatarsal noted. No acute bony abnormality identified. No bony erosions. No soft tissue foreign bodies. Peripheral vascular calcification. IMPRESSION: 1. Diffuse osteopenia in degenerative change. Old left fifth metatarsal fracture. No acute bony abnormality identified. No bony erosions. No soft tissue foreign body. 2.  Peripheral vascular disease. Electronically Signed   By: [place]   On: 09/11/2019 12:14    Assessment/Plan 1. Right foot pain Has not taken extra strength tylenol 1000 mg tablet twice daily and 500 mg tablet at 2 pm daily as directed picked up medication yesterday.she will try this regimen then notify  provider if pain not under control.  2. Type II diabetes mellitus with renal manifestations, uncontrolled ([place]) Status post ED visit for hypoglycemia.Not checking her blood sugars prior to giving self  insulin.Not skipping meals.Instructed to reduce lantus from 50 units SQ to 48 units daily.continue on Humalog 6 units three times with meals if blood sugars are > 150     3. Wound of right lower extremity, subsequent encounter Afebrile.wound cleansed with saline and pat dry.packing not removed.No packing material to change this wound during visit.patient has packing at home.[facility] will be coming tomorrow.continue to follow up with wound center as directed.seen 10/08/2019 by wound center.Home health Nurse to change dressing on alternate days when patient is not going to wound center.continue on Cipro 500 mg tablet twice daily as directed by wound doctor.  Family/ staff Communication: Reviewed plan of care with patient.   Labs/tests ordered: None   Demika Langenderfer C Sanaa Zilberman, NP

## 2019-10-10 ENCOUNTER — Telehealth: Payer: Self-pay

## 2019-10-10 DIAGNOSIS — I70219 Atherosclerosis of native arteries of extremities with intermittent claudication, unspecified extremity: Secondary | ICD-10-CM | POA: Diagnosis not present

## 2019-10-10 DIAGNOSIS — D631 Anemia in chronic kidney disease: Secondary | ICD-10-CM

## 2019-10-10 DIAGNOSIS — E11621 Type 2 diabetes mellitus with foot ulcer: Secondary | ICD-10-CM | POA: Diagnosis not present

## 2019-10-10 DIAGNOSIS — E1151 Type 2 diabetes mellitus with diabetic peripheral angiopathy without gangrene: Secondary | ICD-10-CM | POA: Diagnosis not present

## 2019-10-10 DIAGNOSIS — Z4800 Encounter for change or removal of nonsurgical wound dressing: Secondary | ICD-10-CM | POA: Diagnosis not present

## 2019-10-10 DIAGNOSIS — E113593 Type 2 diabetes mellitus with proliferative diabetic retinopathy without macular edema, bilateral: Secondary | ICD-10-CM | POA: Diagnosis not present

## 2019-10-10 DIAGNOSIS — N182 Chronic kidney disease, stage 2 (mild): Secondary | ICD-10-CM

## 2019-10-10 DIAGNOSIS — E785 Hyperlipidemia, unspecified: Secondary | ICD-10-CM

## 2019-10-10 DIAGNOSIS — E1122 Type 2 diabetes mellitus with diabetic chronic kidney disease: Secondary | ICD-10-CM

## 2019-10-10 DIAGNOSIS — L97512 Non-pressure chronic ulcer of other part of right foot with fat layer exposed: Secondary | ICD-10-CM | POA: Diagnosis not present

## 2019-10-10 DIAGNOSIS — I129 Hypertensive chronic kidney disease with stage 1 through stage 4 chronic kidney disease, or unspecified chronic kidney disease: Secondary | ICD-10-CM | POA: Diagnosis not present

## 2019-10-10 DIAGNOSIS — H8110 Benign paroxysmal vertigo, unspecified ear: Secondary | ICD-10-CM | POA: Diagnosis not present

## 2019-10-10 NOTE — Telephone Encounter (Signed)
Monique Cabrera with [facility] called requesting verbal orders for 2 x weekly for 2 weeks 1 x weekly x 3 weeks and wound care from [name].    Patients admission visit for point of care was today   Per [name] standing order, verbal order given. Message will be sent to patient's provider as a FYI.

## 2019-10-11 ENCOUNTER — Emergency Department (HOSPITAL_COMMUNITY)
Admission: EM | Admit: 2019-10-11 | Discharge: 2019-10-11 | Disposition: A | Discharge status: Home / Self Care | Payer: Medicare Other | Attending: Emergency Medicine | Admitting: Emergency Medicine

## 2019-10-11 ENCOUNTER — Encounter (HOSPITAL_COMMUNITY): Payer: Self-pay | Admitting: Emergency Medicine

## 2019-10-11 ENCOUNTER — Other Ambulatory Visit: Payer: Self-pay

## 2019-10-11 DIAGNOSIS — Z87891 Personal history of nicotine dependence: Secondary | ICD-10-CM | POA: Insufficient documentation

## 2019-10-11 DIAGNOSIS — R404 Transient alteration of awareness: Secondary | ICD-10-CM | POA: Diagnosis not present

## 2019-10-11 DIAGNOSIS — E162 Hypoglycemia, unspecified: Secondary | ICD-10-CM

## 2019-10-11 DIAGNOSIS — Z794 Long term (current) use of insulin: Secondary | ICD-10-CM | POA: Diagnosis not present

## 2019-10-11 DIAGNOSIS — E876 Hypokalemia: Secondary | ICD-10-CM | POA: Insufficient documentation

## 2019-10-11 DIAGNOSIS — E1165 Type 2 diabetes mellitus with hyperglycemia: Secondary | ICD-10-CM | POA: Insufficient documentation

## 2019-10-11 DIAGNOSIS — R000 Tachycardia, unspecified: Secondary | ICD-10-CM | POA: Diagnosis not present

## 2019-10-11 DIAGNOSIS — R001 Bradycardia, unspecified: Secondary | ICD-10-CM | POA: Diagnosis not present

## 2019-10-11 DIAGNOSIS — E11649 Type 2 diabetes mellitus with hypoglycemia without coma: Secondary | ICD-10-CM | POA: Diagnosis not present

## 2019-10-11 DIAGNOSIS — E161 Other hypoglycemia: Secondary | ICD-10-CM | POA: Diagnosis not present

## 2019-10-11 DIAGNOSIS — N182 Chronic kidney disease, stage 2 (mild): Secondary | ICD-10-CM | POA: Diagnosis not present

## 2019-10-11 DIAGNOSIS — Z79899 Other long term (current) drug therapy: Secondary | ICD-10-CM | POA: Insufficient documentation

## 2019-10-11 DIAGNOSIS — I129 Hypertensive chronic kidney disease with stage 1 through stage 4 chronic kidney disease, or unspecified chronic kidney disease: Secondary | ICD-10-CM | POA: Diagnosis not present

## 2019-10-11 DIAGNOSIS — R4182 Altered mental status, unspecified: Secondary | ICD-10-CM | POA: Diagnosis present

## 2019-10-11 LAB — CBC
HCT: 26.5 % — ABNORMAL LOW (ref 36.0–46.0)
Hemoglobin: 9 g/dL — ABNORMAL LOW (ref 12.0–15.0)
MCH: 30.9 pg (ref 26.0–34.0)
MCHC: 34 g/dL (ref 30.0–36.0)
MCV: 91.1 fL (ref 80.0–100.0)
Platelets: 285 10*3/uL (ref 150–400)
RBC: 2.91 MIL/uL — ABNORMAL LOW (ref 3.87–5.11)
RDW: 11.8 % (ref 11.5–15.5)
WBC: 10.2 10*3/uL (ref 4.0–10.5)
nRBC: 0 % (ref 0.0–0.2)

## 2019-10-11 LAB — BASIC METABOLIC PANEL
Anion gap: 11 (ref 5–15)
BUN: 16 mg/dL (ref 8–23)
CO2: 21 mmol/L — ABNORMAL LOW (ref 22–32)
Calcium: 10 mg/dL (ref 8.9–10.3)
Chloride: 96 mmol/L — ABNORMAL LOW (ref 98–111)
Creatinine, Ser: 1.56 mg/dL — ABNORMAL HIGH (ref 0.44–1.00)
GFR calc Af Amer: 35 mL/min — ABNORMAL LOW (ref >60)
GFR calc non Af Amer: 31 mL/min — ABNORMAL LOW (ref >60)
Glucose, Bld: 194 mg/dL — ABNORMAL HIGH (ref 70–99)
Potassium: 2.9 mmol/L — ABNORMAL LOW (ref 3.5–5.1)
Sodium: 128 mmol/L — ABNORMAL LOW (ref 135–145)

## 2019-10-11 LAB — CBG MONITORING, ED
Glucose-Capillary: 183 mg/dL — ABNORMAL HIGH (ref 70–99)
Glucose-Capillary: 186 mg/dL — ABNORMAL HIGH (ref 70–99)
Glucose-Capillary: 182 mg/dL — ABNORMAL HIGH (ref 70–99)

## 2019-10-11 LAB — MAGNESIUM: Magnesium: 1.5 mg/dL — ABNORMAL LOW (ref 1.7–2.4)

## 2019-10-11 MED ORDER — SODIUM CHLORIDE 0.9 % IV SOLN: 250.0000 mL | INTRAVENOUS | Status: DC | PRN

## 2019-10-11 MED ORDER — MAG-OXIDE 200 MG PO TABS: 400.0000 mg | ORAL_TABLET | Freq: Every day | ORAL | 0 refills | Status: AC

## 2019-10-11 MED ORDER — MAGNESIUM OXIDE 400 (241.3 MG) MG PO TABS
400.0000 mg | ORAL_TABLET | Freq: Once | ORAL | Status: AC
  Administered 2019-10-11: 400 mg via ORAL
  Filled 2019-10-11: qty 1

## 2019-10-11 MED ORDER — SODIUM CHLORIDE 0.9% FLUSH: 3.0000 mL | INTRAVENOUS | Status: DC | PRN

## 2019-10-11 MED ORDER — SODIUM CHLORIDE 0.9% FLUSH: 3.0000 mL | Freq: Two times a day (BID) | INTRAVENOUS | Status: DC

## 2019-10-11 MED ORDER — POTASSIUM CHLORIDE CRYS ER 20 MEQ PO TBCR: 20.0000 meq | EXTENDED_RELEASE_TABLET | Freq: Two times a day (BID) | ORAL | 0 refills | Status: DC

## 2019-10-11 MED ORDER — POTASSIUM CHLORIDE CRYS ER 20 MEQ PO TBCR
40.0000 meq | EXTENDED_RELEASE_TABLET | Freq: Once | ORAL | Status: AC
  Administered 2019-10-11: 40 meq via ORAL
  Filled 2019-10-11: qty 2

## 2019-10-11 NOTE — Discharge Instructions (Addendum)
Reduce your insulin lispro (Humalog) down to 3 units subcutaneously for each meal.  Make sure you are eating before you give yourself insulin.  Be sure to keep track of your glucose for your primary care physician.

## 2019-10-11 NOTE — ED Triage Notes (Signed)
Per [facility] pt coming from home where son said patient continuously taking insulin without eating. Initial CBG 50 given D10 and CBG 459. Patient alert and orientated x 4 during triage. Patient also currently taking antibiotics for right foot.

## 2019-10-11 NOTE — ED Provider Notes (Signed)
[facility] EMERGENCY DEPARTMENT Provider Note   CSN: [id number] Arrival date & time: 10/11/19  1613     History   Chief Complaint Chief Complaint  Patient presents with  . Hypoglycemia  . Foot Pain    HPI Monique Cabrera is a 84 y.o. female.     HPI  84 year old female presents with hypoglycemia and change in mental status.  Family noticed a change after she had given herself her prelunch insulin but did not actually eat lunch.  Occurred around 130.  Glucose was around 50 and they tried oral glucose but no significant change in mental status they eventually gave D10.  Glucose went into the 400s eventually.  She is A&O x4 according to EMS but somewhat sleepy.  The patient tells me that she remembers giving herself her insulin and then was sleepy.  She has been having right foot pain and problems with the foot for weeks.  She was placed on Cipro about 3 days ago as well as some pain medicine.  Otherwise, she denies headache, fever, vomiting, diarrhea, abdominal pain.  No drainage from her foot.  She does think she is getting a cold with some sneezing over the last couple days.  No cough or shortness of breath  Past Medical History:  Diagnosis Date  . Acute upper respiratory infections of unspecified site   . Anemia   . Anemia, unspecified   . Atherosclerosis of native arteries of the extremities, unspecified   . Chest pain, unspecified   . Chronic kidney disease (CKD), stage II (mild)   . Diabetes mellitus   . Diarrhea   . Disorder of bone and cartilage, unspecified   . DM (diabetes mellitus) type II controlled with renal manifestation ([place])   . Herpes zoster with other nervous system complications(053.19)   . Hypercalcemia   . Hypertension   . Hypertensive renal disease, benign   . Nonspecific reaction to tuberculin skin test without active tuberculosis(795.51)   . Nonspecific tuberculin test reaction   . Other and unspecified hyperlipidemia   . Pain in  joint, lower leg   . Peripheral arterial disease ([place])   . Postherpetic neuralgia   . Proteinuria   . Stroke ([place]) 01/2017  . Type II or unspecified type diabetes mellitus with renal manifestations, not stated as uncontrolled(250.40)   . Type II or unspecified type diabetes mellitus with renal manifestations, uncontrolled(250.42)   . Unspecified disorder of kidney and ureter   . Unspecified essential hypertension     Patient Active Problem List   Diagnosis Date Noted  . Stable proliferative diabetic retinopathy of both eyes associated with type 2 diabetes mellitus ([place]) 06/05/2018  . Acute CVA (cerebrovascular accident) ([place]) 02/18/2017  . Acute ischemic stroke ([place])   . Internuclear ophthalmoplegia of left eye   . Benign paroxysmal positional vertigo   . Abnormality of gait   . Acute onset of severe vertigo 02/17/2017  . Vertigo 02/17/2017  . Atherosclerosis of native arteries of the extremities with intermittent claudication 03/11/2014  . Diabetic retinopathy ([place]) 03/11/2014  . Retinal hemorrhage due to secondary diabetes ([place]) 03/11/2014  . Type II diabetes mellitus with renal manifestations, uncontrolled ([place]) 03/11/2014  . Chronic hepatitis C without hepatic coma ([place]) 03/11/2014  . Hyperlipidemia 03/11/2014  . Hypoglycemia 04/16/2013  . Disorder of bone and cartilage, unspecified   . Other and unspecified hyperlipidemia   . Essential hypertension, benign   . Atherosclerosis of native arteries of the extremities, unspecified   .  Chronic kidney disease (CKD), stage II (mild)   . Peripheral arterial disease ([place])   . Anemia   . Postherpetic neuralgia   . Hypertension     Past Surgical History:  Procedure Laterality Date  . hysterectomy    . INCISION AND DRAINAGE Left 05/27/14   sebacous cyst, ear  . PRP Left    Dr. Ricki Miller  . removal of cyst from hand    . removal of tumor from foot    . TONSILLECTOMY       OB History   No obstetric history on file.       Home Medications    Prior to Admission medications   Medication Sig Start Date End Date Taking? Authorizing Provider  acetaminophen (TYLENOL) 500 MG tablet Take 2 tablets (1,000 mg total) by mouth 2 (two) times daily. And 500 mg tablet daily at 2 pm 10/03/19  Yes Ngetich, [name] C, NP  amLODipine (NORVASC) 10 MG tablet TAKE 1 TABLET (10 MG TOTAL) BY MOUTH DAILY. FOR HIGH BLOOD PRESSURE 06/13/19  Yes Reed, Tiffany L, DO  atorvastatin (LIPITOR) 20 MG tablet TAKE 1 TABLET BY MOUTH EVERY DAY Patient taking differently: Take 20 mg by mouth daily.  06/11/19  Yes Despina Hick, MD  carvedilol (COREG) 25 MG tablet TAKE 1 TABLET BY MOUTH TWICE A DAY WITH A MEAL Patient taking differently: Take 25 mg by mouth 2 (two) times daily with a meal.  12/26/18  Yes Reed, Tiffany L, DO  ciprofloxacin (CIPRO) 500 MG tablet Take 500 mg by mouth 2 (two) times daily. 10/08/19 10/22/19 Yes [provider]  clopidogrel (PLAVIX) 75 MG tablet TAKE ONE TABLET BY MOUTH ONCE DAILY 12/26/18  Yes Reed, Tiffany L, DO  Insulin Glargine (LANTUS) 100 UNIT/ML Solostar Pen INJECT 48 UNITS UNDER THE SKIN DAILY FOR DIABETES Patient taking differently: Inject 48 Units into the skin daily.  10/09/19  Yes Ngetich, [name] C, NP  insulin lispro (HUMALOG KWIKPEN) 100 UNIT/ML KwikPen INJECT 6 UNITS BREAKFAST, 6 UNITS LUNCH & SUPPER Patient taking differently: Inject 6 Units into the skin 3 (three) times daily.  08/13/19  Yes Reed, Tiffany L, DO  Insulin Pen Needle (B-D ULTRAFINE III SHORT PEN) 31G X 8 MM MISC Use to check blood sugar every day. Dx: 11.29; 11.65 07/02/19  Yes Reed, Tiffany L, DO  ONETOUCH VERIO test strip USE TO TEST BLOOD SUGAR THREE TIMES DAILY. DX: E11.9 10/08/19  Yes Reed, Tiffany L, DO  valsartan-hydrochlorothiazide (DIOVAN-HCT) 320-25 MG tablet TAKE 1 TABLET BY MOUTH EVERY DAY Patient taking differently: Take 1 tablet by mouth daily.  01/08/19  Yes Reed, Tiffany L, DO  Magnesium Oxide (MAG-OXIDE) 200 MG TABS Take 2  tablets (400 mg total) by mouth daily for 3 days. 10/11/19 10/14/19  Sherwood Gambler, MD  potassium chloride SA (KLOR-CON) 20 MEQ tablet Take 1 tablet (20 mEq total) by mouth 2 (two) times daily for 3 days. 10/11/19 10/14/19  Sherwood Gambler, MD    Family History Family History  Problem Relation Age of Onset  . Diabetes Mother   . Diabetes Father   . Diabetes Sister   . Diabetes Sister     Social History Social History   Tobacco Use  . Smoking status: Former Smoker    Types: Cigarettes  . Smokeless tobacco: Never Used  . Tobacco comment: Quit about age 5   Substance Use Topics  . Alcohol use: No    Alcohol/week: 0.0 standard drinks  . Drug use: No  Allergies   Invokana [canagliflozin] and Jardiance [empagliflozin]   Review of Systems Review of Systems  Constitutional: Negative for fever.  HENT: Positive for sneezing.   Respiratory: Negative for cough and shortness of breath.   Cardiovascular: Negative for chest pain.  Gastrointestinal: Negative for abdominal pain, diarrhea and vomiting.  Musculoskeletal: Positive for arthralgias.  Neurological: Negative for headaches.  All other systems reviewed and are negative.    Physical Exam Updated Vital Signs BP (!) 142/59   Pulse 77   Temp 98.1 F (36.7 C) (Oral)   Resp 19   Ht 5\' 5"  (1.651 m)   Wt 63.3 kg   SpO2 100%   BMI 23.23 kg/m   Physical Exam Vitals signs and nursing note reviewed.  Constitutional:      General: She is not in acute distress.    Appearance: She is well-developed. She is not ill-appearing or diaphoretic.  HENT:     Head: Normocephalic and atraumatic.     Right Ear: External ear normal.     Left Ear: External ear normal.     Nose: Nose normal.  Eyes:     General:        Right eye: No discharge.        Left eye: No discharge.  Cardiovascular:     Rate and Rhythm: Normal rate and regular rhythm.     Heart sounds: Normal heart sounds.  Pulmonary:     Effort: Pulmonary effort is  normal.     Breath sounds: Normal breath sounds.  Abdominal:     Palpations: Abdomen is soft.     Tenderness: There is no abdominal tenderness.  Musculoskeletal:     Comments: Mild swelling to the right dorsal foot. Appears to have a shallow wound with eschar. Tenderness diffusely through the foot. No erythema or drainage.   Skin:    General: Skin is warm and dry.  Neurological:     Mental Status: She is alert and oriented to person, place, and time.     Comments: CN 3-12 grossly intact. 5/5 strength in all 4 extremities. Grossly normal sensation. Normal finger to nose.   Psychiatric:        Mood and Affect: Mood is not anxious.      ED Treatments / Results  Labs (all labs ordered are listed, but only abnormal results are displayed) Labs Reviewed  BASIC METABOLIC PANEL - Abnormal; Notable for the following components:      Result Value   Sodium 128 (*)    Potassium 2.9 (*)    Chloride 96 (*)    CO2 21 (*)    Glucose, Bld 194 (*)    Creatinine, Ser 1.56 (*)    GFR calc non Af Amer 31 (*)    GFR calc Af Amer 35 (*)    All other components within normal limits  CBC - Abnormal; Notable for the following components:   RBC 2.91 (*)    Hemoglobin 9.0 (*)    HCT 26.5 (*)    All other components within normal limits  MAGNESIUM - Abnormal; Notable for the following components:   Magnesium 1.5 (*)    All other components within normal limits  CBG MONITORING, ED - Abnormal; Notable for the following components:   Glucose-Capillary 183 (*)    All other components within normal limits  CBG MONITORING, ED - Abnormal; Notable for the following components:   Glucose-Capillary 186 (*)    All other components within normal limits  CBG  MONITORING, ED - Abnormal; Notable for the following components:   Glucose-Capillary 182 (*)    All other components within normal limits  URINALYSIS, COMPLETE (UACMP) WITH MICROSCOPIC    EKG EKG Interpretation  Date/Time:  Thursday October 11 2019  16:25:39 EDT Ventricular Rate:  53 PR Interval:    QRS Duration: 85 QT Interval:  499 QTC Calculation: 469 R Axis:   -24 Text Interpretation:  Sinus bradycardia Consider left ventricular hypertrophy Borderline T abnormalities, diffuse leads no significant change since Oct 04 2019 Confirmed by Sherwood Gambler [phone]) on 10/11/2019 4:28:18 PM   Radiology No results found.  Procedures Procedures (including critical care time)  Medications Ordered in ED Medications  sodium chloride flush (NS) 0.9 % injection 3 mL (has no administration in time range)  sodium chloride flush (NS) 0.9 % injection 3 mL (has no administration in time range)  0.9 %  sodium chloride infusion (has no administration in time range)  potassium chloride SA (KLOR-CON) CR tablet 40 mEq (40 mEq Oral Given 10/11/19 1948)  magnesium oxide (MAG-OX) tablet 400 mg (400 mg Oral Given 10/11/19 1948)     Initial Impression / Assessment and Plan / ED Course  I have reviewed the triage vital signs and the nursing notes.  Pertinent labs & imaging results that were available during my care of the patient were reviewed by me and considered in my medical decision making (see chart for details).        Patient's hypoglycemia could be related to giving herself her Humalog without eating.  Her labs are indicative of hypokalemia and hypomagnesemia.  The anemia seems relatively stable.  Renal function is stable.  She also was put on Cipro which could cause the Humalog to be more or less effective.  In this setting, I have discussed that she should cut her Humalog dose in half for the next few days as it would be better to ride higher than recurrently have hypoglycemic.  Otherwise, I will replete her electrolytes but she appears stable for discharge home.  Follow-up with PCP.  No obvious signs of infection currently besides the foot which apparently is improving.  Final Clinical Impressions(s) / ED Diagnoses   Final diagnoses:   Hypoglycemia  Hypokalemia  Hypomagnesemia    ED Discharge Orders         Ordered    Magnesium Oxide (MAG-OXIDE) 200 MG TABS  Daily     10/11/19 2000    potassium chloride SA (KLOR-CON) 20 MEQ tablet  2 times daily     10/11/19 Joeseph Amor, MD 10/11/19 2138

## 2019-10-12 ENCOUNTER — Telehealth: Payer: Self-pay | Admitting: *Deleted

## 2019-10-12 NOTE — Telephone Encounter (Signed)
Monique Curry, DO  May, [name], CMA        She needs to come in again for ER f/u. She's not eating with her insulin AGAIN and getting lows. This is a recurrent issue. Needs family assistance. Recommend [facility] involvement if possible to get her on track again with meds        Called and spoke with patient and she scheduled an appointment with Dr. Mariea Clonts for Thursday for ER Follow up

## 2019-10-13 DIAGNOSIS — H8110 Benign paroxysmal vertigo, unspecified ear: Secondary | ICD-10-CM | POA: Diagnosis not present

## 2019-10-13 DIAGNOSIS — N182 Chronic kidney disease, stage 2 (mild): Secondary | ICD-10-CM | POA: Diagnosis not present

## 2019-10-13 DIAGNOSIS — I70219 Atherosclerosis of native arteries of extremities with intermittent claudication, unspecified extremity: Secondary | ICD-10-CM | POA: Diagnosis not present

## 2019-10-13 DIAGNOSIS — E1122 Type 2 diabetes mellitus with diabetic chronic kidney disease: Secondary | ICD-10-CM | POA: Diagnosis not present

## 2019-10-13 DIAGNOSIS — I129 Hypertensive chronic kidney disease with stage 1 through stage 4 chronic kidney disease, or unspecified chronic kidney disease: Secondary | ICD-10-CM | POA: Diagnosis not present

## 2019-10-13 DIAGNOSIS — Z4800 Encounter for change or removal of nonsurgical wound dressing: Secondary | ICD-10-CM | POA: Diagnosis not present

## 2019-10-13 DIAGNOSIS — E11621 Type 2 diabetes mellitus with foot ulcer: Secondary | ICD-10-CM | POA: Diagnosis not present

## 2019-10-13 DIAGNOSIS — D631 Anemia in chronic kidney disease: Secondary | ICD-10-CM | POA: Diagnosis not present

## 2019-10-13 DIAGNOSIS — L97512 Non-pressure chronic ulcer of other part of right foot with fat layer exposed: Secondary | ICD-10-CM | POA: Diagnosis not present

## 2019-10-13 DIAGNOSIS — E785 Hyperlipidemia, unspecified: Secondary | ICD-10-CM | POA: Diagnosis not present

## 2019-10-13 DIAGNOSIS — E113593 Type 2 diabetes mellitus with proliferative diabetic retinopathy without macular edema, bilateral: Secondary | ICD-10-CM | POA: Diagnosis not present

## 2019-10-13 DIAGNOSIS — E1151 Type 2 diabetes mellitus with diabetic peripheral angiopathy without gangrene: Secondary | ICD-10-CM | POA: Diagnosis not present

## 2019-10-15 ENCOUNTER — Encounter (HOSPITAL_BASED_OUTPATIENT_CLINIC_OR_DEPARTMENT_OTHER): Payer: Medicare Other | Admitting: Internal Medicine

## 2019-10-15 ENCOUNTER — Other Ambulatory Visit: Payer: Self-pay

## 2019-10-15 ENCOUNTER — Encounter (HOSPITAL_BASED_OUTPATIENT_CLINIC_OR_DEPARTMENT_OTHER): Payer: Self-pay

## 2019-10-15 DIAGNOSIS — Z794 Long term (current) use of insulin: Secondary | ICD-10-CM | POA: Diagnosis not present

## 2019-10-15 DIAGNOSIS — S91301A Unspecified open wound, right foot, initial encounter: Secondary | ICD-10-CM | POA: Diagnosis not present

## 2019-10-15 DIAGNOSIS — E1151 Type 2 diabetes mellitus with diabetic peripheral angiopathy without gangrene: Secondary | ICD-10-CM | POA: Diagnosis not present

## 2019-10-15 DIAGNOSIS — N182 Chronic kidney disease, stage 2 (mild): Secondary | ICD-10-CM | POA: Diagnosis not present

## 2019-10-15 DIAGNOSIS — E11621 Type 2 diabetes mellitus with foot ulcer: Secondary | ICD-10-CM | POA: Diagnosis not present

## 2019-10-15 DIAGNOSIS — I129 Hypertensive chronic kidney disease with stage 1 through stage 4 chronic kidney disease, or unspecified chronic kidney disease: Secondary | ICD-10-CM | POA: Diagnosis not present

## 2019-10-15 DIAGNOSIS — E1122 Type 2 diabetes mellitus with diabetic chronic kidney disease: Secondary | ICD-10-CM | POA: Diagnosis not present

## 2019-10-15 DIAGNOSIS — E11319 Type 2 diabetes mellitus with unspecified diabetic retinopathy without macular edema: Secondary | ICD-10-CM | POA: Diagnosis not present

## 2019-10-15 DIAGNOSIS — E1140 Type 2 diabetes mellitus with diabetic neuropathy, unspecified: Secondary | ICD-10-CM | POA: Diagnosis not present

## 2019-10-15 DIAGNOSIS — B9689 Other specified bacterial agents as the cause of diseases classified elsewhere: Secondary | ICD-10-CM | POA: Diagnosis not present

## 2019-10-15 DIAGNOSIS — L97512 Non-pressure chronic ulcer of other part of right foot with fat layer exposed: Secondary | ICD-10-CM | POA: Diagnosis not present

## 2019-10-15 DIAGNOSIS — M79671 Pain in right foot: Secondary | ICD-10-CM | POA: Diagnosis not present

## 2019-10-15 DIAGNOSIS — L97519 Non-pressure chronic ulcer of other part of right foot with unspecified severity: Secondary | ICD-10-CM | POA: Diagnosis not present

## 2019-10-15 NOTE — Patient Outreach (Signed)
[facility]) Care Management  10/15/2019  Monique Cabrera 08/29/1935 [id number]    Telephone Screen Referral Date :10/11/19 Referral Source:ED notification Referral Reason: Low Blood sugars Insurance:[employer]   Outreach attempt # 1 to patient. Two patient Identifiers given.  Call and spoke with the patient about her two ED visits for low blood sugars.  The patient states that she does not have an appetitive but she will eat something before taking her meals. She states that she maybe does not eat enough.  Discussed with her about eating protein, fruits and vegetables.  Trying to eat six small meals a day.  She verbalized understanding.   She reports that having to see so many doctors and they tell her different things that she gets confused. Advised the patient to have her nephew that takes her to appointment help her to keep up with the information.  She verbalized understanding.She states that having the wound on her right foot and a boot on her left makes it hard to get around.in her residence.  She reports that she literally has to crawl to her bathroom.  I have call and left a message on the clinical intake assistant line at her PCP office to see if she can get a prescription to have a bedside commode.  I let the patient know that I will ask for them to follow up with her.   Plan: RN Health Coach will make a referral to complex case management for low blood sugars and three ED visit within two moth time span.   Lazaro Arms RN, BSN, [place] Direct Dial:  [phone] Fax: [phone]

## 2019-10-16 ENCOUNTER — Other Ambulatory Visit: Payer: Self-pay | Admitting: *Deleted

## 2019-10-16 ENCOUNTER — Other Ambulatory Visit (HOSPITAL_COMMUNITY): Payer: Self-pay | Admitting: Internal Medicine

## 2019-10-16 ENCOUNTER — Other Ambulatory Visit: Payer: Self-pay

## 2019-10-16 ENCOUNTER — Ambulatory Visit (HOSPITAL_COMMUNITY)
Admission: RE | Admit: 2019-10-16 | Discharge: 2019-10-16 | Disposition: A | Discharge status: Home / Self Care | Payer: Medicare Other | Source: Ambulatory Visit | Attending: Cardiology | Admitting: Cardiology

## 2019-10-16 DIAGNOSIS — I739 Peripheral vascular disease, unspecified: Secondary | ICD-10-CM

## 2019-10-16 DIAGNOSIS — L97509 Non-pressure chronic ulcer of other part of unspecified foot with unspecified severity: Secondary | ICD-10-CM | POA: Diagnosis not present

## 2019-10-16 DIAGNOSIS — E13621 Other specified diabetes mellitus with foot ulcer: Secondary | ICD-10-CM | POA: Insufficient documentation

## 2019-10-16 DIAGNOSIS — E1151 Type 2 diabetes mellitus with diabetic peripheral angiopathy without gangrene: Secondary | ICD-10-CM | POA: Insufficient documentation

## 2019-10-16 DIAGNOSIS — L98491 Non-pressure chronic ulcer of skin of other sites limited to breakdown of skin: Secondary | ICD-10-CM

## 2019-10-16 NOTE — Patient Outreach (Signed)
Referral received from [place] coach for complex case management, pt with diagnoses DM and recent episodes of hypoglycemia warranting 2 ED visits, wound right foot and wearing boot on left foot, hx CVA, hepatitis C, DKD, HTN.  Outreach call to pt for screening/ initial telephone assessment, unidentified female answered the phone and states Monique Cabrera is not at home, she is at MD appointment, requests call back another day this week.  PLAN Outreach within 3-4 business days  Monique Cabrera [facility], [place] Coordinator [phone]

## 2019-10-16 NOTE — Patient Outreach (Signed)
[facility]) Care Management  10/16/2019  Monique Cabrera 11/26/1935 [id number]   [place] closing the program.  Patient has been transferred to complex case management.  Lazaro Arms RN, BSN, [place] Direct Dial:  [phone]  Fax: [phone]

## 2019-10-17 ENCOUNTER — Ambulatory Visit (INDEPENDENT_AMBULATORY_CARE_PROVIDER_SITE_OTHER): Payer: Medicare Other

## 2019-10-17 ENCOUNTER — Encounter: Payer: Self-pay | Admitting: *Deleted

## 2019-10-17 ENCOUNTER — Ambulatory Visit (INDEPENDENT_AMBULATORY_CARE_PROVIDER_SITE_OTHER): Payer: Medicare Other | Admitting: Podiatry

## 2019-10-17 ENCOUNTER — Other Ambulatory Visit: Payer: Self-pay

## 2019-10-17 ENCOUNTER — Encounter: Payer: Self-pay | Admitting: Podiatry

## 2019-10-17 ENCOUNTER — Other Ambulatory Visit: Payer: Self-pay | Admitting: *Deleted

## 2019-10-17 VITALS — Ht 65.0 in | Wt 138.0 lb

## 2019-10-17 DIAGNOSIS — I70219 Atherosclerosis of native arteries of extremities with intermittent claudication, unspecified extremity: Secondary | ICD-10-CM | POA: Diagnosis not present

## 2019-10-17 DIAGNOSIS — Z4800 Encounter for change or removal of nonsurgical wound dressing: Secondary | ICD-10-CM | POA: Diagnosis not present

## 2019-10-17 DIAGNOSIS — L97512 Non-pressure chronic ulcer of other part of right foot with fat layer exposed: Secondary | ICD-10-CM | POA: Diagnosis not present

## 2019-10-17 DIAGNOSIS — Z794 Long term (current) use of insulin: Secondary | ICD-10-CM

## 2019-10-17 DIAGNOSIS — E11621 Type 2 diabetes mellitus with foot ulcer: Secondary | ICD-10-CM | POA: Diagnosis not present

## 2019-10-17 DIAGNOSIS — S92312D Displaced fracture of first metatarsal bone, left foot, subsequent encounter for fracture with routine healing: Secondary | ICD-10-CM

## 2019-10-17 DIAGNOSIS — E785 Hyperlipidemia, unspecified: Secondary | ICD-10-CM | POA: Diagnosis not present

## 2019-10-17 DIAGNOSIS — E113593 Type 2 diabetes mellitus with proliferative diabetic retinopathy without macular edema, bilateral: Secondary | ICD-10-CM | POA: Diagnosis not present

## 2019-10-17 DIAGNOSIS — E1151 Type 2 diabetes mellitus with diabetic peripheral angiopathy without gangrene: Secondary | ICD-10-CM | POA: Diagnosis not present

## 2019-10-17 DIAGNOSIS — N182 Chronic kidney disease, stage 2 (mild): Secondary | ICD-10-CM | POA: Diagnosis not present

## 2019-10-17 DIAGNOSIS — H8110 Benign paroxysmal vertigo, unspecified ear: Secondary | ICD-10-CM | POA: Diagnosis not present

## 2019-10-17 DIAGNOSIS — E11319 Type 2 diabetes mellitus with unspecified diabetic retinopathy without macular edema: Secondary | ICD-10-CM

## 2019-10-17 DIAGNOSIS — E1122 Type 2 diabetes mellitus with diabetic chronic kidney disease: Secondary | ICD-10-CM | POA: Diagnosis not present

## 2019-10-17 DIAGNOSIS — L98499 Non-pressure chronic ulcer of skin of other sites with unspecified severity: Secondary | ICD-10-CM | POA: Diagnosis not present

## 2019-10-17 DIAGNOSIS — I129 Hypertensive chronic kidney disease with stage 1 through stage 4 chronic kidney disease, or unspecified chronic kidney disease: Secondary | ICD-10-CM | POA: Diagnosis not present

## 2019-10-17 DIAGNOSIS — D631 Anemia in chronic kidney disease: Secondary | ICD-10-CM | POA: Diagnosis not present

## 2019-10-17 NOTE — Patient Outreach (Signed)
Referral received from  [place] coach, pt with diagnoses 2 hypoglycemic episodes recently requiring ED visits, wound right foot and wearing a boot to left foot, DM type 2 with DM retinopathy, Hx CVA, Hepatitis C, CKD, HTN, outreach call to pt for screening, spoke with pt, HIPAA verified, pt reports she lives alone and her son and grandson check on her daily, she has 3 daughters who come by and 20 grandchildren and different ones stop by throughout the week, family provides transportation as well as "Print[place]".  pt states she could benefit from more steady assistance in the home (maybe through medicaid benefit but has never applied).  Pt is in bed most of day due to difficulty walking due to wound on foot and pain.  Pt receives food stamps and is able to afford medications, insulin dosage recently decreased.  Pt checks CBG several times daily and ranges 100-200's range but seems worse with foot wound, pain and overall stressful situation.  Pt feels controlling her diabetes and getting wound healed are biggest challenges for her.  RN CM placed order for [facility] social work to assist with application for [employer] through medicaid benefit.  RN CM faxed today's note and barrier letter to primary MD.  Outpatient Encounter Medications as of 10/17/2019  Medication Sig Note  . acetaminophen (TYLENOL) 500 MG tablet Take 2 tablets (1,000 mg total) by mouth 2 (two) times daily. And 500 mg tablet daily at 2 pm   . amLODipine (NORVASC) 10 MG tablet TAKE 1 TABLET (10 MG TOTAL) BY MOUTH DAILY. FOR HIGH BLOOD PRESSURE   . atorvastatin (LIPITOR) 20 MG tablet TAKE 1 TABLET BY MOUTH EVERY DAY (Patient taking differently: Take 20 mg by mouth daily. )   . carvedilol (COREG) 25 MG tablet TAKE 1 TABLET BY MOUTH TWICE A DAY WITH A MEAL (Patient taking differently: Take 25 mg by mouth 2 (two) times daily with a meal. )   . clopidogrel (PLAVIX) 75 MG tablet TAKE ONE TABLET BY MOUTH ONCE DAILY   . Insulin Glargine (LANTUS) 100  UNIT/ML Solostar Pen INJECT 48 UNITS UNDER THE SKIN DAILY FOR DIABETES (Patient taking differently: Inject 48 Units into the skin daily. )   . insulin lispro (HUMALOG KWIKPEN) 100 UNIT/ML KwikPen INJECT 6 UNITS BREAKFAST, 6 UNITS LUNCH & SUPPER (Patient taking differently: Inject 3 Units into the skin 3 (three) times daily. )   . Insulin Pen Needle (B-D ULTRAFINE III SHORT PEN) 31G X 8 MM MISC Use to check blood sugar every day. Dx: 11.29; 11.65   . ONETOUCH VERIO test strip USE TO TEST BLOOD SUGAR THREE TIMES DAILY. DX: E11.9   . valsartan-hydrochlorothiazide (DIOVAN-HCT) 320-25 MG tablet TAKE 1 TABLET BY MOUTH EVERY DAY (Patient taking differently: Take 1 tablet by mouth daily. )   . ciprofloxacin (CIPRO) 500 MG tablet Take 500 mg by mouth 2 (two) times daily. 10/11/2019: Taking everyday per patient   . potassium chloride SA (KLOR-CON) 20 MEQ tablet Take 1 tablet (20 mEq total) by mouth 2 (two) times daily for 3 days.    No facility-administered encounter medications on file as of 10/17/2019.     [facility] CM Care Plan Problem One     Most Recent Value  Care Plan Problem One  Knowledge deficit related to self management of diabetes  Role Documenting the Problem One  Care Management [place] for Problem One  Active  [facility]N Long Term Goal   Pt will verbalize, demonstrate improved  self care for diabetes management within 60 days aeb better glucose control and wound healing  [facility] Long Term Goal Start Date  10/17/19  Interventions for Problem One Long Term Goal  RN CM established plan of care with pt, reviewed medications, mailed successful outreach letter to pt home including consent form, pamphlet, 24 hour nurse line magnet, carbohydrate counting poster  [facility]N CM Short Term Goal #1   Pt will verbalize plate method within 30 days  [facility] CM Short Term Goal #1 Start Date  10/17/19  Interventions for Short Term Goal #1  RNCM reviewed plate method with, ask her to take small steps towards this goal  and try plate method  [facility]N CM Short Term Goal #2   Pt will verbalize signs/ symptoms hypoglycemia and actions to take within 30 days  [facility]N CM Short Term Goal #2 Start Date  10/17/19  Interventions for Short Term Goal #2  RN CM reviewed signs/ symptoms hypoglycemia and actions to take, pt has glucose tablets and gel with her, RN CM ask pt to keep food on hand and keep her telephone nearby also  THN CM Short Term Goal #3  Pt will show improvement in wound healing within 30 days  THN CM Short Term Goal #3 Start Date  10/17/19  Interventions for Short Tern Goal #3  RN CM reviewed importane of good nutrition and good blood glucose control for wound healing.    [facility]N CM Care Plan Problem Two     Most Recent Value  Care Plan Problem Two  High risk for falls  Role Documenting the Problem Two  Care Management Coordinator  Care Plan for Problem Two  Active  [facility]N CM Short Term Goal #1   Pt will report no falls and adhere to safety precautions within 30 days  [facility]N CM Short Term Goal #1 Start Date  10/17/19  Interventions for Short Term Goal #2   RN CM reviewed safety precautions, importance of using DME, asking for assistance from family members      PLAN Outreach pt in 2 weeks  Jacqlyn Larsen [facility], [place] [phone]

## 2019-10-17 NOTE — Progress Notes (Signed)
Subjective:   [name] presents today for f/u of fracture left 1st metatarsal. She states her left foot feels better pain wise. Swelling has subsided.   Her main concern today is increased right foot pain. She states she has seen Wound Care and also saw Vascular. She states she is scheduled for procedure to check blood flow on November 10th.  She has been prescribed Tylenol 500 mg and was instructed to take 2 twice daily. Her pain is not controlled. She is tearful today stating her pain is 9/10. She has not been able to sleep.  Patient denies any fever, chills, nightsweats, nausea or vomiting.  Current Outpatient Medications on File Prior to Visit  Medication Sig Dispense Refill  . acetaminophen (TYLENOL) 500 MG tablet Take 2 tablets (1,000 mg total) by mouth 2 (two) times daily. And 500 mg tablet daily at 2 pm 30 tablet 0  . amLODipine (NORVASC) 10 MG tablet TAKE 1 TABLET (10 MG TOTAL) BY MOUTH DAILY. FOR HIGH BLOOD PRESSURE 90 tablet 1  . atorvastatin (LIPITOR) 20 MG tablet TAKE 1 TABLET BY MOUTH EVERY DAY (Patient taking differently: Take 20 mg by mouth daily. ) 90 tablet 1  . carvedilol (COREG) 25 MG tablet TAKE 1 TABLET BY MOUTH TWICE A DAY WITH A MEAL (Patient taking differently: Take 25 mg by mouth 2 (two) times daily with a meal. ) 180 tablet 1  . ciprofloxacin (CIPRO) 500 MG tablet Take 500 mg by mouth 2 (two) times daily.    . clopidogrel (PLAVIX) 75 MG tablet TAKE ONE TABLET BY MOUTH ONCE DAILY 90 tablet 1  . Insulin Glargine (LANTUS) 100 UNIT/ML Solostar Pen INJECT 48 UNITS UNDER THE SKIN DAILY FOR DIABETES (Patient taking differently: Inject 48 Units into the skin daily. ) 45 pen 1  . insulin lispro (HUMALOG KWIKPEN) 100 UNIT/ML KwikPen INJECT 6 UNITS BREAKFAST, 6 UNITS LUNCH & SUPPER (Patient taking differently: Inject 3 Units into the skin 3 (three) times daily. ) 15 mL 1  . Insulin Pen Needle (B-D ULTRAFINE III SHORT PEN) 31G X 8 MM MISC Use to check blood sugar every day.  Dx: 11.29; 11.65 100 each 1  . ONETOUCH VERIO test strip USE TO TEST BLOOD SUGAR THREE TIMES DAILY. DX: E11.9 300 strip 3  . potassium chloride SA (KLOR-CON) 20 MEQ tablet Take 1 tablet (20 mEq total) by mouth 2 (two) times daily for 3 days. 6 tablet 0  . valsartan-hydrochlorothiazide (DIOVAN-HCT) 320-25 MG tablet TAKE 1 TABLET BY MOUTH EVERY DAY (Patient taking differently: Take 1 tablet by mouth daily. ) 90 tablet 1   No current facility-administered medications on file prior to visit.      Allergies  Allergen Reactions  . Invokana [Canagliflozin] Other (See Comments)    Vagina itching and swelling Vaginal Itching and irritation   . Jardiance [Empagliflozin] Itching and Other (See Comments)    Vaginal itching and swelling    Objective:   84 y.o. AAF, anxious, in pain. Tearful.   Constitutional:  98.1 degrees Fahrenheit  Vascular Examination: CFT delayed b/l.  Nonpalpable pedal pulses b/l.  Digital hair absent b/l.  Skin temperature gradient warm to cool b/l.  Dermatological: Ischemic wound noted dorsal aspect of right foot with eschar. Patient unable to keep leg still due to pain.   Musculoskeletal: HAV with bunion deformity b/l.  Xray left foot: Unchanged position of fracture fragments. Edema resolved. No pain on palpation along fracture site.  Assessment:   1. Diabetic ulcer with  ischemia right foot with increasing pain 2.   NIDDM with PAD  Plan: 1. Discussed diagnosis with patient. Her pain is uncontrolled and increasing in intensity. She is unable to sleep. 2. Patient instructed to report to emergency department due to increased pain.

## 2019-10-17 NOTE — Patient Instructions (Signed)
Dr. Elisha Ponder recommends going to the hospital today for pain level 9 out of 10.

## 2019-10-18 ENCOUNTER — Telehealth: Payer: Self-pay

## 2019-10-18 ENCOUNTER — Other Ambulatory Visit: Payer: Self-pay

## 2019-10-18 ENCOUNTER — Telehealth: Payer: Self-pay | Admitting: Internal Medicine

## 2019-10-18 ENCOUNTER — Ambulatory Visit (INDEPENDENT_AMBULATORY_CARE_PROVIDER_SITE_OTHER): Payer: Medicare Other | Admitting: Internal Medicine

## 2019-10-18 ENCOUNTER — Encounter: Payer: Self-pay | Admitting: Internal Medicine

## 2019-10-18 VITALS — BP 138/90 | HR 96 | Temp 98.7°F | Ht 65.0 in | Wt 137.6 lb

## 2019-10-18 DIAGNOSIS — I739 Peripheral vascular disease, unspecified: Secondary | ICD-10-CM

## 2019-10-18 DIAGNOSIS — M79671 Pain in right foot: Secondary | ICD-10-CM

## 2019-10-18 DIAGNOSIS — Z66 Do not resuscitate: Secondary | ICD-10-CM

## 2019-10-18 DIAGNOSIS — E1165 Type 2 diabetes mellitus with hyperglycemia: Secondary | ICD-10-CM

## 2019-10-18 DIAGNOSIS — E1122 Type 2 diabetes mellitus with diabetic chronic kidney disease: Secondary | ICD-10-CM

## 2019-10-18 DIAGNOSIS — IMO0002 Reserved for concepts with insufficient information to code with codable children: Secondary | ICD-10-CM

## 2019-10-18 DIAGNOSIS — L97518 Non-pressure chronic ulcer of other part of right foot with other specified severity: Secondary | ICD-10-CM

## 2019-10-18 DIAGNOSIS — E11621 Type 2 diabetes mellitus with foot ulcer: Secondary | ICD-10-CM | POA: Diagnosis not present

## 2019-10-18 DIAGNOSIS — N183 Chronic kidney disease, stage 3 unspecified: Secondary | ICD-10-CM

## 2019-10-18 DIAGNOSIS — Z794 Long term (current) use of insulin: Secondary | ICD-10-CM

## 2019-10-18 MED ORDER — GABAPENTIN 100 MG PO CAPS: 100.0000 mg | ORAL_CAPSULE | Freq: Three times a day (TID) | ORAL | 3 refills | Status: DC

## 2019-10-18 MED ORDER — INSULIN LISPRO (1 UNIT DIAL) 100 UNIT/ML (KWIKPEN): 3.0000 [IU] | PEN_INJECTOR | Freq: Three times a day (TID) | SUBCUTANEOUS | 5 refills | Status: DC

## 2019-10-18 MED ORDER — DOXYCYCLINE HYCLATE 100 MG PO TABS: 100.0000 mg | ORAL_TABLET | Freq: Two times a day (BID) | ORAL | 0 refills | Status: DC

## 2019-10-18 NOTE — Patient Outreach (Signed)
[name]) Care Management  10/18/2019  Monique Cabrera 10/06/1935 [id number]   Social work referral received from [facility], Jacqlyn Larsen, to assist patient with request for personal care services. Successful outreach to patient today.  Informed her about process for requesting/assessment for pcs.  Faxed request form to [employer].  Will follow up with patient when completed form has been sent to [name] and assessment is ready to be scheduled.   Ronn Melena, BSW Social Worker [phone]

## 2019-10-18 NOTE — Telephone Encounter (Signed)
Patient states she has 2 appointments on Monday and she is confused as to why the appointments were scheduled 15 min apart and 1 is in [place].  I informed patient that she will need to call those offices to pose her question for I am not sure why those appointments were scheduled like that. Patient had the number for Dr.Robson's office and I provided her with [facility]'s number.

## 2019-10-18 NOTE — Telephone Encounter (Signed)
Spoke with the patient and made her aware of Dr. Tyrell Antonio recommendation. Patient appt scheduled at the [facility] office on 10/22/19 @ 10:30am with Dr. Fletcher Anon. Patient and her grandson are aware of the appt date, time, and location. Patient voiced appreciation for the call.

## 2019-10-18 NOTE — Progress Notes (Signed)
DARWIN, GUASTELLA ([id number]) Visit Report for 10/15/2019 HPI Details Patient Name: Date of Service: Monique Cabrera, Monique Cabrera 10/15/2019 10:15 AM Medical Record [id number] Patient Account Number: [id number] Date of Birth/Sex: Treating RN: [birth year] (84 y.o. [name] Primary Care Provider: Hollace Kinnier Other Clinician: Referring Provider: Treating Provider/Extender:Hulet Ehrmann, Arlyss Queen, Lindell Spar in Treatment: 2 History of Present Illness HPI Description: ADMISSION 10/01/2019 This is an 84 year old woman with type 2 diabetes on insulin. Last hemoglobin A1c 9.5 in July. Her problem started out initially in late August she felt a popping sound she was seen in primary care at [name] and felt to have gout in the left first metatarsal phalangeal. She was referred to podiatry and found a fracture that they found a fracture of the first metatarsal. They also noted a ulcer in the right fourth webspace roughly 2 to 4 weeks ago. She was seen in the ER with pain and swelling in the right foot. An x-ray showed no osseous deformity. She was put on Keflex.. During her last appointment with podiatry she was given Iodosorb and his mother placed on the wound. She lives alone and finds this difficult to do. She has just completed a course of Augmentin and doxycycline. This was apparently given to her in the ER on 9/15. The patient also tells me that she has a history of PAD. In fact several years ago was told that she needed a stent in the left leg but she never followed up with this. I am also not clear who she saw. She clearly has claudication with pain in the right ankle when she walks from her apartment to the mailbox in her building forcing her to stop and rest. ABIs in our clinic were at the posterior tibial on the right at 0.78. Past medical history; type 2 diabetes on insulin recent hemoglobin A1c of 9.5, PAD, retinopathy, hyperlipidemia, chronic kidney disease stage  II, anemia, postherpetic neuralgia 10/12; patient complains of more pain in the right leg that is keeping her up at night. She is not complaining of pain in the left foot however.foot is in a fracture boot. Culture I did of the wound between her fourth and fifth toes last week showed Serratia and a organism I am not familiar with cold and Pentoea. I will start her on ciprofloxacin today. She was in the ER over the weekend and x-ray of the foot did not show osteomyelitis 10/19; arrives with a dark black eschar over the distal foot at the level of the fourth toe. This is in addition to the painful wound area between the fourth and fifth toes. She is completing her antibiotics. She has noninvasive arterial studies with Dr. Kennon Holter office tomorrow. We are using silver alginate Electronic Signature(s) Signed: 10/15/2019 5:54:03 PM By: Linton Ham MD Entered By: Linton Ham on 10/15/2019 13:51:35 -------------------------------------------------------------------------------- Physical Exam Details Patient Name: Date of Service: Monique Cabrera 10/15/2019 10:15 AM Medical Record [id number] Patient Account Number: [id number] Date of Birth/Sex: Treating RN: 31-Oct-1935 (84 y.o. [name] Primary Care Provider: Hollace Kinnier Other Clinician: Referring Provider: Treating Provider/Extender:Quadre Bristol, Arlyss Queen, Tiffany Weeks in Treatment: 2 Constitutional Patient is hypertensive.. Pulse regular and within target range for patient.Marland Kitchen Respirations regular, non-labored and within target range.. Temperature is normal and within the target range for the patient.Marland Kitchen Appears in no distress. Eyes Conjunctivae clear. No discharge.no icterus. Respiratory work of breathing is normal. Cardiovascular Pulses not palpable on the right foot. Integumentary (Hair, Skin) No primary skin issues are seen.  Psychiatric appears at normal baseline. Notes Wound exam; very tender between her  fourth and fifth toes. She now has dry eschar over the dorsal part of her foot just proximal to the toes. There is no overt infection here however the area is very painful. Her forefoot is cool Electronic Signature(s) Signed: 10/15/2019 5:54:03 PM By: Linton Ham MD Entered By: Linton Ham on 10/15/2019 14:03:42 -------------------------------------------------------------------------------- Physician Orders Details Patient Name: Date of Service: Monique Cabrera 10/15/2019 10:15 AM Medical Record [id number] Patient Account Number: [id number] Date of Birth/Sex: Treating RN: 11/10/1935 (84 y.o. [name] Primary Care Provider: Hollace Kinnier Other Clinician: Referring Provider: Treating Provider/Extender:Marita Burnsed, Arlyss Queen, Lindell Spar in Treatment: 2 Verbal / Phone Orders: No Diagnosis Coding ICD-10 Coding Code Description E11.621 Type 2 diabetes mellitus with foot ulcer E11.51 Type 2 diabetes mellitus with diabetic peripheral angiopathy without gangrene L97.518 Non-pressure chronic ulcer of other part of right foot with other specified severity Follow-up Appointments Return Appointment in 1 week. Dressing Change Frequency Wound #1 Right Toe - Web between 4th and 5th Change Dressing every other day. Wound #2 Right,Dorsal Foot Change Dressing every other day. Wound Cleansing Wound #1 Right Toe - Web between 4th and 5th Clean wound with Wound Cleanser - or normal saline Wound #2 Right,Dorsal Foot Clean wound with Wound Cleanser - or normal saline Primary Wound Dressing Wound #1 Right Toe - Web between 4th and 5th Calcium Alginate with Silver - lightly pack Wound #2 Right,Dorsal Foot Calcium Alginate with Silver Secondary Dressing Wound #1 Right Toe - Web between 4th and 5th Dry Gauze - secure with tape Wound #2 Right,Dorsal Foot Kerlix/Rolled Gauze Dry [place] skilled nursing for wound care. - [facility] Electronic  Signature(s) Signed: 10/15/2019 5:54:03 PM By: Linton Ham MD Signed: 10/17/2019 6:50:57 PM By: Levan Hurst RN, BSN Entered By: Levan Hurst on 10/15/2019 11:49:37 -------------------------------------------------------------------------------- Problem List Details Patient Name: Date of Service: Monique Cabrera 10/15/2019 10:15 AM Medical Record [id number] Patient Account Number: [id number] Date of Birth/Sex: Treating RN: [birth year] (84 y.o. [name] Primary Care Provider: Hollace Kinnier Other Clinician: Referring Provider: Treating Provider/Extender:Jame Morrell, Arlyss Queen, Lindell Spar in Treatment: 2 Active Problems ICD-10 Evaluated Encounter Code Description Active Date Today Diagnosis E11.621 Type 2 diabetes mellitus with foot ulcer 10/01/2019 No Yes E11.51 Type 2 diabetes mellitus with diabetic peripheral 10/01/2019 No Yes angiopathy without gangrene L97.518 Non-pressure chronic ulcer of other part of right foot 10/01/2019 No Yes with other specified severity Inactive Problems Resolved Problems Electronic Signature(s) Signed: 10/15/2019 5:54:03 PM By: Linton Ham MD Entered By: Linton Ham on 10/15/2019 13:48:35 -------------------------------------------------------------------------------- Progress Note Details Patient Name: Date of Service: Monique Cabrera 10/15/2019 10:15 AM Medical Record [id number] Patient Account Number: [id number] Date of Birth/Sex: Treating RN: 24-Jun-1935 (84 y.o. [name] Primary Care Provider: Hollace Kinnier Other Clinician: Referring Provider: Treating Provider/Extender:Lansing Sigmon, Arlyss Queen, Lindell Spar in Treatment: 2 Subjective History of Present Illness (HPI) ADMISSION 10/01/2019 This is an 84 year old woman with type 2 diabetes on insulin. Last hemoglobin A1c 9.5 in July. Her problem started out initially in late August she felt a popping sound she was seen in primary care at [facility] and felt to have gout in the left first metatarsal phalangeal. She was referred to podiatry and found a fracture that they found a fracture of the first metatarsal. They also noted a ulcer in the right fourth webspace roughly 2 to 4 weeks ago. She was seen in the ER with  pain and swelling in the right foot. An x-ray showed no osseous deformity. She was put on Keflex.. During her last appointment with podiatry she was given Iodosorb and his mother placed on the wound. She lives alone and finds this difficult to do. She has just completed a course of Augmentin and doxycycline. This was apparently given to her in the ER on 9/15. The patient also tells me that she has a history of PAD. In fact several years ago was told that she needed a stent in the left leg but she never followed up with this. I am also not clear who she saw. She clearly has claudication with pain in the right ankle when she walks from her apartment to the mailbox in her building forcing her to stop and rest. ABIs in our clinic were at the posterior tibial on the right at 0.78. Past medical history; type 2 diabetes on insulin recent hemoglobin A1c of 9.5, PAD, retinopathy, hyperlipidemia, chronic kidney disease stage II, anemia, postherpetic neuralgia 10/12; patient complains of more pain in the right leg that is keeping her up at night. She is not complaining of pain in the left foot however.foot is in a fracture boot. Culture I did of the wound between her fourth and fifth toes last week showed Serratia and a organism I am not familiar with cold and Pentoea. I will start her on ciprofloxacin today. She was in the ER over the weekend and x-ray of the foot did not show osteomyelitis 10/19; arrives with a dark black eschar over the distal foot at the level of the fourth toe. This is in addition to the painful wound area between the fourth and fifth toes. She is completing her antibiotics. She has noninvasive  arterial studies with Dr. Kennon Holter office tomorrow. We are using silver alginate Objective Constitutional Patient is hypertensive.. Pulse regular and within target range for patient.Marland Kitchen Respirations regular, non-labored and within target range.. Temperature is normal and within the target range for the patient.Marland Kitchen Appears in no distress. Vitals Time Taken: 11:09 AM, Height: 65 in, Weight: 138 lbs, BMI: 23, Temperature: 98.7 F, Pulse: 78 bpm, Respiratory Rate: 18 breaths/min, Blood Pressure: 147/60 mmHg. Eyes Conjunctivae clear. No discharge.no icterus. Respiratory work of breathing is normal. Cardiovascular Pulses not palpable on the right foot. Psychiatric appears at normal baseline. General Notes: Wound exam; very tender between her fourth and fifth toes. She now has dry eschar over the dorsal part of her foot just proximal to the toes. There is no overt infection here however the area is very painful. Her forefoot is cool Integumentary (Hair, Skin) No primary skin issues are seen. Wound #1 status is Open. Original cause of wound was Gradually Appeared. The wound is located on the Right Toe - Web between 4th and 5th. The wound measures 1.2cm length x 0.5cm width x 1.7cm depth; 0.471cm^2 area and 0.801cm^3 volume. There is Fat Layer (Subcutaneous Tissue) Exposed exposed. There is no tunneling or undermining noted. There is a medium amount of serosanguineous drainage noted. The wound margin is well defined and not attached to the wound base. There is large (67-100%) pink, pale granulation within the wound bed. There is no necrotic tissue within the wound bed. Wound #2 status is Open. Original cause of wound was Gradually Appeared. The wound is located on the Right,Dorsal Foot. The wound measures 3.5cm length x 1.7cm width x 0.1cm depth; 4.673cm^2 area and 0.467cm^3 volume. There is no tunneling or undermining noted. There is a small amount  of serosanguineous drainage noted. The wound  margin is flat and intact. There is no granulation within the wound bed. There is a large (67-100%) amount of necrotic tissue within the wound bed including Eschar. Assessment Active Problems ICD-10 Type 2 diabetes mellitus with foot ulcer Type 2 diabetes mellitus with diabetic peripheral angiopathy without gangrene Non-pressure chronic ulcer of other part of right foot with other specified severity Plan Follow-up Appointments: Return Appointment in 1 week. Dressing Change Frequency: Wound #1 Right Toe - Web between 4th and 5th: Change Dressing every other day. Wound #2 Right,Dorsal Foot: Change Dressing every other day. Wound Cleansing: Wound #1 Right Toe - Web between 4th and 5th: Clean wound with Wound Cleanser - or normal saline Wound #2 Right,Dorsal Foot: Clean wound with Wound Cleanser - or normal saline Primary Wound Dressing: Wound #1 Right Toe - Web between 4th and 5th: Calcium Alginate with Silver - lightly pack Wound #2 Right,Dorsal Foot: Calcium Alginate with Silver Secondary Dressing: Wound #1 Right Toe - Web between 4th and 5th: Dry Gauze - secure with tape Wound #2 Right,Dorsal Foot: Kerlix/Rolled Gauze Dry Gauze Home Health: [place] skilled nursing for wound care. - [facility] 1. Silver alginate 2. Vascular studies tomorrow. I suspect these will be poor and we will have to arrange a consultation for an arteriogram Electronic Signature(s) Signed: 10/15/2019 5:54:03 PM By: Linton Ham MD Entered By: Linton Ham on 10/15/2019 14:04:20 -------------------------------------------------------------------------------- SuperBill Details Patient Name: Date of Service: Monique Cabrera 10/15/2019 Medical Record [id number] Patient Account Number: [id number] Date of Birth/Sex: Treating RN: 15-Dec-1935 (84 y.o. [name] Primary Care Provider: Hollace Kinnier Other Clinician: Referring Provider: Treating Provider/Extender:Axten Pascucci,  Arlyss Queen, Lindell Spar in Treatment: 2 Diagnosis Coding ICD-10 Codes Code Description E11.621 Type 2 diabetes mellitus with foot ulcer E11.51 Type 2 diabetes mellitus with diabetic peripheral angiopathy without gangrene L97.518 Non-pressure chronic ulcer of other part of right foot with other specified severity Facility Procedures CPT4 Code: [id number] Description: 99214 - WOUND CARE VISIT-LEV 4 EST PT Modifier: Quantity: 1 Physician Procedures CPT4 Code Description: [id number] [place] - WC PHYS LEVEL 3 - EST PT ICD-10 Diagnosis Description L97.518 Non-pressure chronic ulcer of other part of right foot with E11.621 Type 2 diabetes mellitus with foot ulcer Modifier: other specifie Quantity: 1 d severity Electronic Signature(s) Signed: 10/15/2019 5:54:03 PM By: Linton Ham MD Entered By: Linton Ham on 10/15/2019 14:04:36

## 2019-10-18 NOTE — Telephone Encounter (Signed)
Please contact patient and let her know that Dr. Tyrell Antonio office (vascular) will see her Monday and plan to arrange an angiogram after that to look at her blood vessels in her legs more.  I also sent doxycycline to her pharmacy (another antibiotic) due to the odor, pain and redness surrounding her wound.

## 2019-10-18 NOTE — Patient Instructions (Signed)
Begin doxycycline 100mg  twice a day for 10 days for wound infection, odor. I will reach out to Dr. Dellia Nims about your foot and get a plan in place.   I'd like to see you again in a month myself.   I ordered your bedside commode and wheelchair to make it easier to get around at home. I've ordered gabapentin 100mg  by mouth three times a day to help the pain in your right leg.  Continue to take the tylenol scheduled as ordered.  Let me know if your pain is not improving by Monday.

## 2019-10-18 NOTE — Telephone Encounter (Signed)
-----   Message from Wellington Hampshire, MD sent at 10/18/2019  1:05 PM EDT ----- Lattie Haw, This patient has severe peripheral arterial disease and has an ischemic ulceration on the right foot.  She is scheduled to see me at the [facility] office on November 10.  However, I have to see her sooner.  I am off next week but I am coming to do a cardiac cath on Monday at 9:30. Can you please contact the patient and see if she can come see me here in the [place] office on Monday at 9 AM or 10:30 AM?  She will require an angiogram and we can schedule it for the week after once I see her.  The angiogram will be done at [facility].  Thanks

## 2019-10-18 NOTE — Progress Notes (Signed)
Location:  [facility] clinic Provider: Jeanne Terrance L. Mariea Clonts, D.O., C.M.D.  Code Status: DNR Goals of Care:  Advanced Directives 10/17/2019  Does Patient Have a Medical Advance Directive? No  Type of Advance Directive -  Does patient want to make changes to medical advance directive? -  Would patient like information on creating a medical advance directive? No - Patient declined  Pre-existing out of facility DNR order (yellow form or pink MOST form) -     Chief Complaint  Patient presents with  . Hospitalization Follow-up    ER Follow up. Needs a Rx for a Wheelchair and Bedside Commode due to Boot on Right Foot    HPI: Patient is a 84 y.o. female seen today for an acute visit for uncontrolled pain in foot and need for wheelchair and bedside commode due to challenges getting around her home since she's got one foot in a boot and one with a wound. Broken foot is ok.  It's the ulcer that is giving her all kinds of trouble.  She's been in pain.  She was told that her circulation remains poor and that is one of the causes of the pain.  When she goes to put her foot down.  Pain shoots up.  She has to gradually put the heel down on the right foot.  It was not getting better at first.    Her son had been cooking her meals.  He got frustrated b/c her appetite was small and she was not eating much. He's brought frozen dinners for her.  She does have a shower chair to do that on her own.  She has been crawling to the bathroom.  If she uses crutches her sandal and boot were getting crossed over.  She does not like to ask for help.    When she saw podiatry yesterday, she was told to go to the ED due to increased pain 9/10.  She opted not to do that and is here to see me for ER f/u.    REviewed arterial doppler with ABIs:   Right: Total occlusion noted in the superficial femoral artery. Total occlusion noted in the posterior tibial artery. Total occlusion noted in the peroneal artery. Left: Total occlusion  noted in the superficial femoral artery. Total occlusion noted in the superficial femoral artery and/or popliteal artery. Total occlusion noted in the posterior tibial artery. 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 Vascular consult recommended. Patient scheduled with Dr. Fletcher Anon on 11/06/19.  Past Medical History:  Diagnosis Date  . Acute upper respiratory infections of unspecified site   . Anemia   . Anemia, unspecified   . Atherosclerosis of native arteries of the extremities, unspecified   . Chest pain, unspecified   . Chronic kidney disease (CKD), stage II (mild)   . Diabetes mellitus   . Diarrhea   . Disorder of bone and cartilage, unspecified   . DM (diabetes mellitus) type II controlled with renal manifestation ([place])   . Herpes zoster with other nervous system complications(053.19)   . Hypercalcemia   . Hypertension   . Hypertensive renal disease, benign   . Nonspecific reaction to tuberculin skin test without active tuberculosis(795.51)   . Nonspecific tuberculin test reaction   . Other and unspecified hyperlipidemia   . Pain in joint, lower leg   . Peripheral arterial disease ([place])   . Postherpetic neuralgia   .  Proteinuria   . Stroke ([place]) 01/2017  . Type II or unspecified type diabetes mellitus with renal manifestations, not stated as uncontrolled(250.40)   . Type II or unspecified type diabetes mellitus with renal manifestations, uncontrolled(250.42)   . Unspecified disorder of kidney and ureter   . Unspecified essential hypertension     Past Surgical History:  Procedure Laterality Date  . hysterectomy    . INCISION AND DRAINAGE Left 05/27/14   sebacous cyst, ear  . PRP Left    Dr. Ricki Miller  . removal of cyst from hand    . removal of tumor from foot    . TONSILLECTOMY      Allergies  Allergen Reactions  . Invokana  [Canagliflozin] Other (See Comments)    Vagina itching and swelling Vaginal Itching and irritation   . Jardiance [Empagliflozin] Itching and Other (See Comments)    Vaginal itching and swelling    Outpatient Encounter Medications as of 10/18/2019  Medication Sig  . acetaminophen (TYLENOL) 500 MG tablet Take 2 tablets (1,000 mg total) by mouth 2 (two) times daily. And 500 mg tablet daily at 2 pm  . amLODipine (NORVASC) 10 MG tablet TAKE 1 TABLET (10 MG TOTAL) BY MOUTH DAILY. FOR HIGH BLOOD PRESSURE  . atorvastatin (LIPITOR) 20 MG tablet TAKE 1 TABLET BY MOUTH EVERY DAY (Patient taking differently: Take 20 mg by mouth daily. )  . carvedilol (COREG) 25 MG tablet TAKE 1 TABLET BY MOUTH TWICE A DAY WITH A MEAL (Patient taking differently: Take 25 mg by mouth 2 (two) times daily with a meal. )  . ciprofloxacin (CIPRO) 500 MG tablet Take 500 mg by mouth 2 (two) times daily.  . clopidogrel (PLAVIX) 75 MG tablet TAKE ONE TABLET BY MOUTH ONCE DAILY  . Insulin Glargine (LANTUS) 100 UNIT/ML Solostar Pen INJECT 48 UNITS UNDER THE SKIN DAILY FOR DIABETES (Patient taking differently: Inject 48 Units into the skin daily. )  . insulin lispro (HUMALOG KWIKPEN) 100 UNIT/ML KwikPen INJECT 6 UNITS BREAKFAST, 6 UNITS LUNCH & SUPPER (Patient taking differently: Inject 3 Units into the skin 3 (three) times daily. )  . Insulin Pen Needle (B-D ULTRAFINE III SHORT PEN) 31G X 8 MM MISC Use to check blood sugar every day. Dx: 11.29; 11.65  . ONETOUCH VERIO test strip USE TO TEST BLOOD SUGAR THREE TIMES DAILY. DX: E11.9  . valsartan-hydrochlorothiazide (DIOVAN-HCT) 320-25 MG tablet TAKE 1 TABLET BY MOUTH EVERY DAY (Patient taking differently: Take 1 tablet by mouth daily. )  . potassium chloride SA (KLOR-CON) 20 MEQ tablet Take 1 tablet (20 mEq total) by mouth 2 (two) times daily for 3 days.   No facility-administered encounter medications on file as of 10/18/2019.     Review of Systems:  Review of Systems    Constitutional: Negative for chills, fever and malaise/fatigue.  Eyes:       Glasses  Respiratory: Negative for cough and shortness of breath.   Cardiovascular: Positive for claudication. Negative for chest pain, palpitations, orthopnea and PND.       Foot swelling on right plantar surface and of lateral toes  Gastrointestinal: Negative for abdominal pain, blood in stool, constipation, diarrhea and melena.  Genitourinary: Negative for dysuria.  Musculoskeletal: Negative for falls and joint pain.  Skin: Negative for itching and rash.       Right foot pain, swelling, warmth, black discoloration of dorsum; wound b/w 4th and 5th toes; strong odor of infection and necrosis  Neurological: Negative for dizziness and loss of  consciousness.  Endo/Heme/Allergies: Does not bruise/bleed easily.  Psychiatric/Behavioral: Positive for memory loss. Negative for depression. The patient is not nervous/anxious and does not have insomnia.     Health Maintenance  Topic Date Due  . OPHTHALMOLOGY EXAM  01/26/2019  . FOOT EXAM  02/06/2019  . HEMOGLOBIN A1C  12/28/2019  . INFLUENZA VACCINE  Completed  . DEXA SCAN  Completed  . PNA vac Low Risk Adult  Completed    Physical Exam: Vitals:   10/18/19 1056  BP: 138/90  Pulse: 96  Temp: 98.7 F (37.1 C)  TempSrc: Oral  SpO2: 97%  Weight: 137 lb 9.6 oz (62.4 kg)  Height: 5\' 5"  (1.651 m)   Body mass index is 22.9 kg/m. Physical Exam Vitals signs reviewed.  Constitutional:      General: She is not in acute distress.    Appearance: Normal appearance. She is not toxic-appearing.  HENT:     Head: Normocephalic and atraumatic.  Eyes:     Comments: glasses  Cardiovascular:     Rate and Rhythm: Normal rate and regular rhythm.     Heart sounds: No murmur. No friction rub. No gallop.   Pulmonary:     Effort: Pulmonary effort is normal.     Breath sounds: Normal breath sounds. No wheezing, rhonchi or rales.  Abdominal:     General: Bowel sounds are  normal.  Musculoskeletal: Normal range of motion.     Comments: Left foot back in sneaker after foot fracture; right in orthopedic sandal with sock--has wound b/w her first and second toes and necrosis on dorsum of foot proximal to first and second toes; surrounding mild erythema and warmth, quite tender on entire lateral foot and even plantar foot  Skin:    Comments: See MSK above; photo in media  Neurological:     General: No focal deficit present.     Mental Status: She is alert and oriented to person, place, and time.     Comments: Some short term memory deficits (particularly when hypoglycemic)  Psychiatric:        Mood and Affect: Mood normal.     Comments: Tearful describing fear of amputation     Labs reviewed: Basic Metabolic Panel: Recent Labs    09/17/19 1518 10/04/19 1739 10/11/19 1715 10/11/19 1900  NA 134* 133* 128*  --   K 3.8 3.9 2.9*  --   CL 98 100 96*  --   CO2 24 22 21*  --   GLUCOSE 226* 215* 194*  --   BUN 38* 18 16  --   CREATININE 1.71* 1.42* 1.56*  --   CALCIUM 10.6* 9.5 10.0  --   MG  --   --   --  1.5*   Liver Function Tests: Recent Labs    06/27/19 0835 09/11/19 1136 09/17/19 1518  AST 20 18 13   ALT 20 13 8   ALKPHOS  --  59  --   BILITOT 0.5 0.6 0.4  PROT 6.5 6.8 6.7  ALBUMIN  --  3.4*  --    No results for input(s): LIPASE, AMYLASE in the last 8760 hours. No results for input(s): AMMONIA in the last 8760 hours. CBC: Recent Labs    09/11/19 1136 09/17/19 1518 10/04/19 1739 10/11/19 1715  WBC 9.5 12.1* 9.6 10.2  NEUTROABS 5.8 7,974* 7.1  --   HGB 10.9* 10.6* 9.3* 9.0*  HCT 33.2* 31.4* 29.2* 26.5*  MCV 95.1 91.8 94.5 91.1  PLT 275 366 236 285  Lipid Panel: Recent Labs    10/25/18 0801 02/07/19 0900  CHOL 123 121  HDL 49* 48*  LDLCALC 58 60  TRIG 81 53  CHOLHDL 2.5 2.5   Lab Results  Component Value Date   HGBA1C 9.5 (H) 06/27/2019    Procedures since last visit: Dg Chest Portable 1 View  Result Date:  10/04/2019 CLINICAL DATA:  Syncope. EXAM: PORTABLE CHEST 1 VIEW COMPARISON:  Chest radiograph 09/11/2019 FINDINGS: Monitoring leads overlie the patient. Stable cardiac and mediastinal contours. Aortic atherosclerosis. Left-greater-than-right basilar heterogeneous opacities. No large area pulmonary consolidation. No pleural effusion or pneumothorax. Thoracic spine degenerative changes. IMPRESSION: Minimal left greater right basilar opacities favored represent atelectasis. Infection not excluded. Electronically Signed   By: Lovey Newcomer M.D.   On: 10/04/2019 18:29   Dg Foot Complete Left  Result Date: 10/17/2019 Please see detailed radiograph report in office note.  Dg Foot Complete Left  Result Date: 09/26/2019 Please see detailed radiograph report in office note.  Dg Foot Complete Right  Result Date: 10/04/2019 CLINICAL DATA:  Diabetic foot ulcer right foot between fourth and fifth toes. Evaluate for osteomyelitis. EXAM: RIGHT FOOT COMPLETE - 3+ VIEW COMPARISON:  09/26/2019 and 09/11/2019 FINDINGS: Mild hallux valgus deformity with degenerative changes at the first MTP joint. Old distal fifth metatarsal fracture. No definite focal bone destruction to suggest osteomyelitis. No air within the soft tissues. Small vessel atherosclerotic disease is present. IMPRESSION: No acute findings. Electronically Signed   By: Marin Olp M.D.   On: 10/04/2019 19:12   Dg Foot Complete Right  Result Date: 09/26/2019 Please see detailed radiograph report in office note.  Vas [place] Burnard Bunting With/wo Tbi  Result Date: 10/17/2019 LOWER EXTREMITY DOPPLER STUDY Indications: Ulceration, and peripheral artery disease. Patient referred from              [place] for a wound on the top of the foot, between the fourth              and fifth toes on the right. The area is very tender as is the              entire right foot. She thinks the wound began about 6 weeks ago.              Also recently found to have a fracture in the  left foot but does              not complain of pain on that side. Also describes claudication              symptoms in the right ankle when she walking from her apartment to              the mailbox in her building, which forces her to stop and rest. High Risk Factors: Hypertension, hyperlipidemia, Diabetes, past history of                    smoking, prior CVA.  Comparison Study: In 11/18, a multilevel arterial Doppler study showed a right                   ABI of 0.83 (overestimated) and the left ABI was                   non-compressible; TBI's were 0.15 on the right and 0.09 on the  left. Performing Technologist: Mariane Masters RVT  Examination Guidelines: A complete evaluation includes at minimum, Doppler waveform signals and systolic blood pressure reading at the level of bilateral brachial, anterior tibial, and posterior tibial arteries, when vessel segments are accessible. Bilateral testing is considered an integral part of a complete examination. Photoelectric Plethysmograph (PPG) waveforms and toe systolic pressure readings are included as required and additional duplex testing as needed. Limited examinations for reoccurring indications may be performed as noted.  ABI Findings: +---------+------------------+-----+-------------------+--------+ Right    Rt Pressure (mmHg)IndexWaveform           Comment  +---------+------------------+-----+-------------------+--------+ Brachial 171                                                +---------+------------------+-----+-------------------+--------+ ATA      254               1.49 dampened monophasic         +---------+------------------+-----+-------------------+--------+ PTA      254               1.49 dampened monophasic         +---------+------------------+-----+-------------------+--------+ PERO     254               1.49 dampened monophasic          +---------+------------------+-----+-------------------+--------+ Great Toe0                 0.00 Absent                      +---------+------------------+-----+-------------------+--------+ +---------+------------------+-----+-------------------+-------+ Left     Lt Pressure (mmHg)IndexWaveform           Comment +---------+------------------+-----+-------------------+-------+ Brachial 166                                               +---------+------------------+-----+-------------------+-------+ ATA      254               1.49 dampened monophasic        +---------+------------------+-----+-------------------+-------+ PTA      254               1.49 absent                     +---------+------------------+-----+-------------------+-------+ PERO     254               1.49 dampened monophasic        +---------+------------------+-----+-------------------+-------+ Great Toe0                 0.00 Absent                     +---------+------------------+-----+-------------------+-------+ +-------+-----------+-----------+------------+------------+ ABI/TBIToday's ABIToday's TBIPrevious ABIPrevious TBI +-------+-----------+-----------+------------+------------+ Right  [place]         0          0.83        0.15         +-------+-----------+-----------+------------+------------+ Left   [place]         0          [place]          0.09         +-------+-----------+-----------+------------+------------+  TOES Findings: +----------+---------------+--------+-------+ Right ToesPressure (mmHg)WaveformComment +----------+---------------+--------+-------+ 1st Digit                Absent          +----------+---------------+--------+-------+ 2nd Digit                Absent          +----------+---------------+--------+-------+ 3rd Digit                Absent          +----------+---------------+--------+-------+ 4th Digit                Absent           +----------+---------------+--------+-------+ 5th Digit                Absent          +----------+---------------+--------+-------+  +---------+---------------+--------+-------+ Left ToesPressure (mmHg)WaveformComment +---------+---------------+--------+-------+ 1st Digit               Absent          +---------+---------------+--------+-------+ 2nd Digit               Absent          +---------+---------------+--------+-------+ 3rd Digit               Absent          +---------+---------------+--------+-------+ 4th Digit               Absent          +---------+---------------+--------+-------+ 5th Digit               Absent          +---------+---------------+--------+-------+   Arterial wall calcification precludes accurate ankle pressures and ABIs. Bilateral ABIs and TBIs appear decreased compared to prior study on 10/28/17.  Summary: Right: Resting right ankle-brachial index indicates noncompressible right lower extremity arteries. The right toe-brachial index is abnormal. Waveform analysis is consistent with severe peripheral arterial disease. Left: Resting left ankle-brachial index indicates noncompressible left lower extremity arteries. The left toe-brachial index is abnormal. Waveform analysis is consistent with severe perihperal arterial disease.  *See table(s) above for measurements and observations. See arterial duplex report.  Vascular consult recommended. Patient taken to check out and scheduled with Dr. Fletcher Anon on November 06, 2019. Electronically signed by Larae Grooms MD on 10/17/2019 at 3:14:38 PM.    Final    Vas [place] Lower Extremity Arterial Duplex  Result Date: 10/17/2019 LOWER EXTREMITY ARTERIAL DUPLEX STUDY Indications: Ulceration, and peripheral artery disease. Patient referred from              [place] for a wound on the top of the foot, between the fourth              and fifth toes on the right. The area is very tender as is the               entire right foot. She thinks the wound began about 6 weeks ago.              Also recently found to have a fracture in the left foot but does              not complain of pain on that side. Also describes claudication              symptoms in the right ankle when she walking from her apartment to  the mailbox in her building, which forces her to stop and rest. High Risk Factors: Hypertension, hyperlipidemia, Diabetes, past history of                    smoking, prior CVA.  Current ABI: Today's ABI's are non-compressible bilaterally. Limitations: Overlying bowel gas in the aorta-iliac portion of exam and calcific              plaque throughout Comparison Study: In 04/2013, an arterial duplex was performed at [facility]                   Cardiovascular which showed no significant stenosis at the                   time. Performing Technologist: Mariane Masters RVT  Examination Guidelines: A complete evaluation includes B-mode imaging, spectral Doppler, color Doppler, and power Doppler as needed of all accessible portions of each vessel. Bilateral testing is considered an integral part of a complete examination. Limited examinations for reoccurring indications may be performed as noted.  +-----------+--------+-----+--------+----------+------------------------------+ RIGHT      PSV cm/sRatioStenosisWaveform  Comments                       +-----------+--------+-----+--------+----------+------------------------------+ CIA Prox   43                   biphasic                                 +-----------+--------+-----+--------+----------+------------------------------+ CIA Mid    79                   biphasic                                 +-----------+--------+-----+--------+----------+------------------------------+ CIA Distal 82                   triphasic                                +-----------+--------+-----+--------+----------+------------------------------+ EIA Prox   86                    biphasic                                 +-----------+--------+-----+--------+----------+------------------------------+ EIA Mid    105                  biphasic                                 +-----------+--------+-----+--------+----------+------------------------------+ EIA Distal 96                   biphasic                                 +-----------+--------+-----+--------+----------+------------------------------+ CFA Prox   67                   biphasic                                 +-----------+--------+-----+--------+----------+------------------------------+  DFA        81                   biphasic                                 +-----------+--------+-----+--------+----------+------------------------------+ SFA Prox   11                   resistant pre-occlusive thump signal     +-----------+--------+-----+--------+----------+------------------------------+ SFA Mid    0            occluded                                         +-----------+--------+-----+--------+----------+------------------------------+ SFA Distal 72                   monophasicreconstituted vs. collateral                                             flow                           +-----------+--------+-----+--------+----------+------------------------------+ POP Prox   13                   monophasic                               +-----------+--------+-----+--------+----------+------------------------------+ POP Distal 21                   monophasic                               +-----------+--------+-----+--------+----------+------------------------------+ TP Trunk   21                   monophasic                               +-----------+--------+-----+--------+----------+------------------------------+ ATA Distal 27                   monophasic                                +-----------+--------+-----+--------+----------+------------------------------+ PTA Distal 0            occluded                                         +-----------+--------+-----+--------+----------+------------------------------+ PERO Distal0            occluded                                         +-----------+--------+-----+--------+----------+------------------------------+  +-----------+--------+-----+--------+----------+--------+ LEFT       PSV cm/sRatioStenosisWaveform  Comments +-----------+--------+-----+--------+----------+--------+ EIA Prox   61  monophasic         +-----------+--------+-----+--------+----------+--------+ EIA Mid    72                   monophasic         +-----------+--------+-----+--------+----------+--------+ EIA Distal 58                   monophasic         +-----------+--------+-----+--------+----------+--------+ CFA Prox   44                   monophasic         +-----------+--------+-----+--------+----------+--------+ DFA        48                   monophasic         +-----------+--------+-----+--------+----------+--------+ SFA Prox   30                   resistant          +-----------+--------+-----+--------+----------+--------+ SFA Mid    0            occluded                   +-----------+--------+-----+--------+----------+--------+ SFA Distal 0            occluded                   +-----------+--------+-----+--------+----------+--------+ POP Prox   0            occluded                   +-----------+--------+-----+--------+----------+--------+ POP Distal 19                   monophasic         +-----------+--------+-----+--------+----------+--------+ TP Trunk   16                   monophasic         +-----------+--------+-----+--------+----------+--------+ ATA Distal 30                   monophasic          +-----------+--------+-----+--------+----------+--------+ PTA Prox   0            occluded                   +-----------+--------+-----+--------+----------+--------+ PERO Distal17                   monophasic         +-----------+--------+-----+--------+----------+--------+  Aorta: +--------+-------+----------+----------+--------+--------+-----+         AP (cm)Trans (cm)PSV (cm/s)WaveformThrombusShape +--------+-------+----------+----------+--------+--------+-----+ Proximal       2.50      74        biphasic              +--------+-------+----------+----------+--------+--------+-----+ Mid     1.90   2.20      29        biphasic              +--------+-------+----------+----------+--------+--------+-----+ Distal  1.40   1.40      40        biphasic              +--------+-------+----------+----------+--------+--------+-----+ The IVC appears patent.  Summary: Calcific atherosclerosis throughout the aorta and bilateral lower extremity arteries. Right: Total occlusion noted in the superficial femoral artery. Total occlusion noted in  the posterior tibial artery. Total occlusion noted in the peroneal artery. Left: Total occlusion noted in the superficial femoral artery. Total occlusion noted in the superficial femoral artery and/or popliteal artery. Total occlusion noted in the posterior tibial artery. 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  See table(s) above for measurements and observations. Vascular consult recommended. Patient scheduled with Dr. Fletcher Anon on 11/06/19. Electronically signed by Larae Grooms MD on 10/17/2019 at 3:16:13 PM.    Final     Assessment/Plan 1. Right foot pain - due to diabetic ulcer and arterial disease -cont tylenol scheduled - ordered DME for her--getting [facility] already - DME  Bedside commode - DME Wheelchair manual - start gabapentin (NEURONTIN) 100 MG capsule; Take 1 capsule (100 mg total) by mouth 3 (three) times daily.  Dispense: 90 capsule; Refill: 3 due to some neuropathic nature of pain -may need opioid for adequate relief if this is not successful  2. Diabetic ulcer of toe of right foot associated with type 2 diabetes mellitus, with other ulcer severity ([place]) - cause of pain -DME Bedside commode - DME Wheelchair manual - gabapentin (NEURONTIN) 100 MG capsule; Take 1 capsule (100 mg total) by mouth 3 (three) times daily.  Dispense: 90 capsule; Refill: 3 - doxycycline (VIBRA-TABS) 100 MG tablet; Take 1 tablet (100 mg total) by mouth 2 (two) times daily.  Dispense: 20 tablet; Refill: 0  3. Uncontrolled type 2 diabetes mellitus with stage 3 chronic kidney disease, with long-term current use of insulin ([place]) - has had challenges with control long-term--either controlled with good hba1c and having significant hypoglycemia from not eating or hba1c sky high from eating wrong food and not taking insulin  Lab Results  Component Value Date   HGBA1C 9.5 (H) 06/27/2019   -has reduced humalog due to her lows - insulin lispro (HUMALOG KWIKPEN) 100 UNIT/ML KwikPen; Inject 0.03 mLs (3 Units total) into the skin 3 (three) times daily.  Dispense: 6 pen; Refill: 5  4.  PAD:   -total occlusion of arteries of both lower extremities identified on noninvasive studies 10/20 -spoke with Dr. Dellia Nims re: wound infection and critical limb ischemia and Dr. Doristine Section will see her on Monday and get her scheduled for an angiogram the next week -will treat with doxycycline 100mg  po bid for 10 days -pt to notify me if pain no better with abx and gabapentin  5. DNR (do not resuscitate) - Do not attempt resuscitation (DNR)  At least 40 mins spent with patient, reviewing records and coordinating care with [facility], home care, specialists  Labs/tests ordered:  No new Next appt:   11/01/2019  Aylen Stradford L. Arian Mcquitty, D.O. [facility] [address], [place] [address] Cell Phone (Mon-Fri 8am-5pm):  [phone] On Call:  [phone] & follow prompts after 5pm & weekends Office Phone:  [phone] Office Fax:  [phone]

## 2019-10-18 NOTE — Telephone Encounter (Signed)
Patient notified and agreed.  

## 2019-10-19 DIAGNOSIS — E11621 Type 2 diabetes mellitus with foot ulcer: Secondary | ICD-10-CM | POA: Diagnosis not present

## 2019-10-19 DIAGNOSIS — Z4800 Encounter for change or removal of nonsurgical wound dressing: Secondary | ICD-10-CM | POA: Diagnosis not present

## 2019-10-19 DIAGNOSIS — I129 Hypertensive chronic kidney disease with stage 1 through stage 4 chronic kidney disease, or unspecified chronic kidney disease: Secondary | ICD-10-CM | POA: Diagnosis not present

## 2019-10-19 DIAGNOSIS — D631 Anemia in chronic kidney disease: Secondary | ICD-10-CM | POA: Diagnosis not present

## 2019-10-19 DIAGNOSIS — I70219 Atherosclerosis of native arteries of extremities with intermittent claudication, unspecified extremity: Secondary | ICD-10-CM | POA: Diagnosis not present

## 2019-10-19 DIAGNOSIS — E113593 Type 2 diabetes mellitus with proliferative diabetic retinopathy without macular edema, bilateral: Secondary | ICD-10-CM | POA: Diagnosis not present

## 2019-10-19 DIAGNOSIS — N182 Chronic kidney disease, stage 2 (mild): Secondary | ICD-10-CM | POA: Diagnosis not present

## 2019-10-19 DIAGNOSIS — E1151 Type 2 diabetes mellitus with diabetic peripheral angiopathy without gangrene: Secondary | ICD-10-CM | POA: Diagnosis not present

## 2019-10-19 DIAGNOSIS — E785 Hyperlipidemia, unspecified: Secondary | ICD-10-CM | POA: Diagnosis not present

## 2019-10-19 DIAGNOSIS — L97512 Non-pressure chronic ulcer of other part of right foot with fat layer exposed: Secondary | ICD-10-CM | POA: Diagnosis not present

## 2019-10-19 DIAGNOSIS — H8110 Benign paroxysmal vertigo, unspecified ear: Secondary | ICD-10-CM | POA: Diagnosis not present

## 2019-10-19 DIAGNOSIS — E1122 Type 2 diabetes mellitus with diabetic chronic kidney disease: Secondary | ICD-10-CM | POA: Diagnosis not present

## 2019-10-22 ENCOUNTER — Telehealth: Payer: Self-pay

## 2019-10-22 ENCOUNTER — Other Ambulatory Visit: Payer: Self-pay

## 2019-10-22 ENCOUNTER — Ambulatory Visit (INDEPENDENT_AMBULATORY_CARE_PROVIDER_SITE_OTHER): Payer: Medicare Other | Admitting: Cardiovascular Disease

## 2019-10-22 ENCOUNTER — Telehealth: Payer: Self-pay | Admitting: *Deleted

## 2019-10-22 ENCOUNTER — Encounter: Payer: Self-pay | Admitting: Cardiovascular Disease

## 2019-10-22 ENCOUNTER — Encounter (HOSPITAL_BASED_OUTPATIENT_CLINIC_OR_DEPARTMENT_OTHER): Payer: Medicare Other | Admitting: Internal Medicine

## 2019-10-22 VITALS — BP 156/70 | HR 100 | Ht 65.0 in | Wt 137.0 lb

## 2019-10-22 DIAGNOSIS — L89899 Pressure ulcer of other site, unspecified stage: Secondary | ICD-10-CM | POA: Diagnosis not present

## 2019-10-22 DIAGNOSIS — E785 Hyperlipidemia, unspecified: Secondary | ICD-10-CM

## 2019-10-22 DIAGNOSIS — L97521 Non-pressure chronic ulcer of other part of left foot limited to breakdown of skin: Secondary | ICD-10-CM | POA: Diagnosis not present

## 2019-10-22 DIAGNOSIS — N182 Chronic kidney disease, stage 2 (mild): Secondary | ICD-10-CM | POA: Diagnosis not present

## 2019-10-22 DIAGNOSIS — I10 Essential (primary) hypertension: Secondary | ICD-10-CM

## 2019-10-22 DIAGNOSIS — I739 Peripheral vascular disease, unspecified: Secondary | ICD-10-CM

## 2019-10-22 NOTE — Telephone Encounter (Signed)
lmtcb with the patient's grandson. Called to let the patient know that she will need to have her COVID test at [facility] drive up testing for procedure on 10/29/19 @ 9:30am.

## 2019-10-22 NOTE — Telephone Encounter (Signed)
Received fax from [employer], Social Worker with [facility] for [employer] form for patient.  Placed in Dr. Magdalene Molly folder to review, fill out and sign.  To be faxed back to Fax: [phone]

## 2019-10-22 NOTE — H&P (View-Only) (Signed)
Cardiology Office Note   Date:  10/22/2019   ID:  Cabrera, Monique June 30, 1935, MRN [id number]  PCP:  [name], DO  Cardiologist:   Kathlyn Sacramento, MD   Chief Complaint  Patient presents with  . OTHER    PAD/Ischemic ulceration Right foot. Meds reviewed verbally with pt.      History of Present Illness: Monique Cabrera is a 84 y.o. female who was referred by Dr. Mariea Clonts for evaluation management of peripheral arterial disease with ischemic ulceration on the right foot. The patient has no prior cardiac history.  She has extensive medical problems including prolonged history of diabetes mellitus, essential hypertension, hyperlipidemia, previous stroke on clopidogrel, chronic kidney disease and peripheral arterial disease.  She smoked briefly in her 25s but none recently.  There is no family history of coronary artery disease. She was diagnosed with peripheral arterial disease in 2018 and was seen by Dr. Virgina Jock.  At that time she had claudication with no evidence of critical limb ischemia and was treated medically.  She has not been seen there since then. About 1 month ago, she developed an ulceration on the base of her right lateral toes with dark discoloration.  This has been worsening since then in spite of attending at the wound center.  She underwent noninvasive vascular studies in our office which showed noncompressible vessels to calculate ABI.  Toe pressure was absent.  Duplex showed occluded right SFA with one-vessel runoff below the knee via the anterior tibial artery.  On the left, the SFA and popliteal arteries were occluded with possible occlusion of the left common iliac artery due to sluggish flow.   Past Medical History:  Diagnosis Date  . Acute upper respiratory infections of unspecified site   . Anemia   . Anemia, unspecified   . Atherosclerosis of native arteries of the extremities, unspecified   . Chest pain, unspecified   . Chronic kidney disease  (CKD), stage II (mild)   . Diabetes mellitus   . Diarrhea   . Disorder of bone and cartilage, unspecified   . DM (diabetes mellitus) type II controlled with renal manifestation ([place])   . Herpes zoster with other nervous system complications(053.19)   . Hypercalcemia   . Hypertension   . Hypertensive renal disease, benign   . Nonspecific reaction to tuberculin skin test without active tuberculosis(795.51)   . Nonspecific tuberculin test reaction   . Other and unspecified hyperlipidemia   . Pain in joint, lower leg   . Peripheral arterial disease ([place])   . Postherpetic neuralgia   . Proteinuria   . Stroke ([place]) 01/2017  . Type II or unspecified type diabetes mellitus with renal manifestations, not stated as uncontrolled(250.40)   . Type II or unspecified type diabetes mellitus with renal manifestations, uncontrolled(250.42)   . Unspecified disorder of kidney and ureter   . Unspecified essential hypertension     Past Surgical History:  Procedure Laterality Date  . hysterectomy    . INCISION AND DRAINAGE Left 05/27/14   sebacous cyst, ear  . PRP Left    Dr. Ricki Miller  . removal of cyst from hand    . removal of tumor from foot    . TONSILLECTOMY       Current Outpatient Medications  Medication Sig Dispense Refill  . amLODipine (NORVASC) 10 MG tablet TAKE 1 TABLET (10 MG TOTAL) BY MOUTH DAILY. FOR HIGH BLOOD PRESSURE 90 tablet 1  . atorvastatin (LIPITOR) 20 MG tablet TAKE  1 TABLET BY MOUTH EVERY DAY (Patient taking differently: Take 20 mg by mouth daily. ) 90 tablet 1  . carvedilol (COREG) 25 MG tablet TAKE 1 TABLET BY MOUTH TWICE A DAY WITH A MEAL (Patient taking differently: Take 25 mg by mouth 2 (two) times daily with a meal. ) 180 tablet 1  . clopidogrel (PLAVIX) 75 MG tablet TAKE ONE TABLET BY MOUTH ONCE DAILY 90 tablet 1  . doxycycline (VIBRA-TABS) 100 MG tablet Take 100 mg by mouth 2 (two) times daily.    Marland Kitchen gabapentin (NEURONTIN) 100 MG capsule Take 1 capsule (100 mg  total) by mouth 3 (three) times daily. 90 capsule 3  . Insulin Glargine (LANTUS) 100 UNIT/ML Solostar Pen INJECT 48 UNITS UNDER THE SKIN DAILY FOR DIABETES (Patient taking differently: Inject 48 Units into the skin daily. ) 45 pen 1  . insulin lispro (HUMALOG KWIKPEN) 100 UNIT/ML KwikPen Inject 0.03 mLs (3 Units total) into the skin 3 (three) times daily. 6 pen 5  . Insulin Pen Needle (B-D ULTRAFINE III SHORT PEN) 31G X 8 MM MISC Use to check blood sugar every day. Dx: 11.29; 11.65 100 each 1  . ONETOUCH VERIO test strip USE TO TEST BLOOD SUGAR THREE TIMES DAILY. DX: E11.9 300 strip 3  . potassium chloride SA (KLOR-CON) 20 MEQ tablet Take 1 tablet (20 mEq total) by mouth 2 (two) times daily for 3 days. 6 tablet 0  . valsartan-hydrochlorothiazide (DIOVAN-HCT) 320-25 MG tablet TAKE 1 TABLET BY MOUTH EVERY DAY (Patient taking differently: Take 1 tablet by mouth daily. ) 90 tablet 1   No current facility-administered medications for this visit.     Allergies:   Invokana [canagliflozin] and Jardiance [empagliflozin]    Social History:  The patient  reports that she has quit smoking. Her smoking use included cigarettes. She has never used smokeless tobacco. She reports that she does not drink alcohol or use drugs.   Family History:  The patient's family history includes Diabetes in her father, mother, sister, and sister.    ROS:  Please see the history of present illness.   Otherwise, review of systems are positive for none.   All other systems are reviewed and negative.    PHYSICAL EXAM: VS:  BP (!) 156/70 (BP Location: Right Arm, Patient Position: Sitting, Cuff Size: Normal)   Pulse 100   Ht 5\' 5"  (1.651 m)   Wt 137 lb (62.1 kg)   SpO2 98%   BMI 22.80 kg/m  , BMI Body mass index is 22.8 kg/m. GEN: Well nourished, well developed, in no acute distress  HEENT: normal  Neck: no JVD, carotid bruits, or masses Cardiac: RRR; no murmurs, rubs, or gallops,no edema  Respiratory:  clear to  auscultation bilaterally, normal work of breathing GI: soft, nontender, nondistended, + BS MS: no deformity or atrophy  Skin: warm and dry, no rash Neuro:  Strength and sensation are intact Psych: euthymic mood, full affect Distal pulses are not palpable.  There is ischemic ulceration at the base of the lateral right toes with gangrenous changes.   EKG:  EKG is ordered today. The ekg ordered today demonstrates normal sinus rhythm with left axis deviation and minimal LVH.   Recent Labs: 09/17/2019: ALT 8 10/11/2019: BUN 16; Creatinine, Ser 1.56; Hemoglobin 9.0; Magnesium 1.5; Platelets 285; Potassium 2.9; Sodium 128    Lipid Panel    Component Value Date/Time   CHOL 121 02/07/2019 0900   CHOL 204 (H) 11/24/2015 0938   TRIG 53  02/07/2019 0900   HDL 48 (L) 02/07/2019 0900   HDL 53 11/24/2015 0938   CHOLHDL 2.5 02/07/2019 0900   VLDL 24 02/17/2017 2355   LDLCALC 60 02/07/2019 0900      Wt Readings from Last 3 Encounters:  10/22/19 137 lb (62.1 kg)  10/18/19 137 lb 9.6 oz (62.4 kg)  10/17/19 138 lb (62.6 kg)       No flowsheet data found.    ASSESSMENT AND PLAN:  1.  Peripheral arterial disease with critical limb ischemia with tissue loss (Rutherford class V).  Patient is at high risk for limb loss and she understands this.  This is an urgent situation.  I recommend proceeding with abdominal aortogram with lower extremity runoff and possible endovascular intervention.  She is already on Plavix for previous stroke.  Planned access is via the left common femoral artery but if we have difficulty due to iliac disease, we might need to consider left brachial access.  I discussed the procedure in details as well as risks and benefits.  Hydrate before the procedure given chronic kidney disease.  2.  Essential hypertension: Blood pressure is mildly elevated.  3.  Hyperlipidemia: Continue treatment with atorvastatin.    Disposition:   FU with me in 1  month  Signed,  Kathlyn Sacramento, MD  10/22/2019 10:55 AM    [place]

## 2019-10-22 NOTE — Progress Notes (Signed)
Cardiology Office Note   Date:  10/22/2019   ID:  Monique, Cabrera Jul 31, 1935, MRN [id number]  PCP:  [name], DO  Cardiologist:   Kathlyn Sacramento, MD   Chief Complaint  Patient presents with  . OTHER    PAD/Ischemic ulceration Right foot. Meds reviewed verbally with pt.      History of Present Illness: Monique Cabrera is a 84 y.o. female who was referred by Dr. Mariea Clonts for evaluation management of peripheral arterial disease with ischemic ulceration on the right foot. The patient has no prior cardiac history.  She has extensive medical problems including prolonged history of diabetes mellitus, essential hypertension, hyperlipidemia, previous stroke on clopidogrel, chronic kidney disease and peripheral arterial disease.  She smoked briefly in her 45s but none recently.  There is no family history of coronary artery disease. She was diagnosed with peripheral arterial disease in 2018 and was seen by Dr. Virgina Jock.  At that time she had claudication with no evidence of critical limb ischemia and was treated medically.  She has not been seen there since then. About 1 month ago, she developed an ulceration on the base of her right lateral toes with dark discoloration.  This has been worsening since then in spite of attending at the wound center.  She underwent noninvasive vascular studies in our office which showed noncompressible vessels to calculate ABI.  Toe pressure was absent.  Duplex showed occluded right SFA with one-vessel runoff below the knee via the anterior tibial artery.  On the left, the SFA and popliteal arteries were occluded with possible occlusion of the left common iliac artery due to sluggish flow.   Past Medical History:  Diagnosis Date  . Acute upper respiratory infections of unspecified site   . Anemia   . Anemia, unspecified   . Atherosclerosis of native arteries of the extremities, unspecified   . Chest pain, unspecified   . Chronic kidney disease  (CKD), stage II (mild)   . Diabetes mellitus   . Diarrhea   . Disorder of bone and cartilage, unspecified   . DM (diabetes mellitus) type II controlled with renal manifestation ([place])   . Herpes zoster with other nervous system complications(053.19)   . Hypercalcemia   . Hypertension   . Hypertensive renal disease, benign   . Nonspecific reaction to tuberculin skin test without active tuberculosis(795.51)   . Nonspecific tuberculin test reaction   . Other and unspecified hyperlipidemia   . Pain in joint, lower leg   . Peripheral arterial disease ([place])   . Postherpetic neuralgia   . Proteinuria   . Stroke ([place]) 01/2017  . Type II or unspecified type diabetes mellitus with renal manifestations, not stated as uncontrolled(250.40)   . Type II or unspecified type diabetes mellitus with renal manifestations, uncontrolled(250.42)   . Unspecified disorder of kidney and ureter   . Unspecified essential hypertension     Past Surgical History:  Procedure Laterality Date  . hysterectomy    . INCISION AND DRAINAGE Left 05/27/14   sebacous cyst, ear  . PRP Left    Dr. Ricki Miller  . removal of cyst from hand    . removal of tumor from foot    . TONSILLECTOMY       Current Outpatient Medications  Medication Sig Dispense Refill  . amLODipine (NORVASC) 10 MG tablet TAKE 1 TABLET (10 MG TOTAL) BY MOUTH DAILY. FOR HIGH BLOOD PRESSURE 90 tablet 1  . atorvastatin (LIPITOR) 20 MG tablet TAKE  1 TABLET BY MOUTH EVERY DAY (Patient taking differently: Take 20 mg by mouth daily. ) 90 tablet 1  . carvedilol (COREG) 25 MG tablet TAKE 1 TABLET BY MOUTH TWICE A DAY WITH A MEAL (Patient taking differently: Take 25 mg by mouth 2 (two) times daily with a meal. ) 180 tablet 1  . clopidogrel (PLAVIX) 75 MG tablet TAKE ONE TABLET BY MOUTH ONCE DAILY 90 tablet 1  . doxycycline (VIBRA-TABS) 100 MG tablet Take 100 mg by mouth 2 (two) times daily.    Marland Kitchen gabapentin (NEURONTIN) 100 MG capsule Take 1 capsule (100 mg  total) by mouth 3 (three) times daily. 90 capsule 3  . Insulin Glargine (LANTUS) 100 UNIT/ML Solostar Pen INJECT 48 UNITS UNDER THE SKIN DAILY FOR DIABETES (Patient taking differently: Inject 48 Units into the skin daily. ) 45 pen 1  . insulin lispro (HUMALOG KWIKPEN) 100 UNIT/ML KwikPen Inject 0.03 mLs (3 Units total) into the skin 3 (three) times daily. 6 pen 5  . Insulin Pen Needle (B-D ULTRAFINE III SHORT PEN) 31G X 8 MM MISC Use to check blood sugar every day. Dx: 11.29; 11.65 100 each 1  . ONETOUCH VERIO test strip USE TO TEST BLOOD SUGAR THREE TIMES DAILY. DX: E11.9 300 strip 3  . potassium chloride SA (KLOR-CON) 20 MEQ tablet Take 1 tablet (20 mEq total) by mouth 2 (two) times daily for 3 days. 6 tablet 0  . valsartan-hydrochlorothiazide (DIOVAN-HCT) 320-25 MG tablet TAKE 1 TABLET BY MOUTH EVERY DAY (Patient taking differently: Take 1 tablet by mouth daily. ) 90 tablet 1   No current facility-administered medications for this visit.     Allergies:   Invokana [canagliflozin] and Jardiance [empagliflozin]    Social History:  The patient  reports that she has quit smoking. Her smoking use included cigarettes. She has never used smokeless tobacco. She reports that she does not drink alcohol or use drugs.   Family History:  The patient's family history includes Diabetes in her father, mother, sister, and sister.    ROS:  Please see the history of present illness.   Otherwise, review of systems are positive for none.   All other systems are reviewed and negative.    PHYSICAL EXAM: VS:  BP (!) 156/70 (BP Location: Right Arm, Patient Position: Sitting, Cuff Size: Normal)   Pulse 100   Ht 5\' 5"  (1.651 m)   Wt 137 lb (62.1 kg)   SpO2 98%   BMI 22.80 kg/m  , BMI Body mass index is 22.8 kg/m. GEN: Well nourished, well developed, in no acute distress  HEENT: normal  Neck: no JVD, carotid bruits, or masses Cardiac: RRR; no murmurs, rubs, or gallops,no edema  Respiratory:  clear to  auscultation bilaterally, normal work of breathing GI: soft, nontender, nondistended, + BS MS: no deformity or atrophy  Skin: warm and dry, no rash Neuro:  Strength and sensation are intact Psych: euthymic mood, full affect Distal pulses are not palpable.  There is ischemic ulceration at the base of the lateral right toes with gangrenous changes.   EKG:  EKG is ordered today. The ekg ordered today demonstrates normal sinus rhythm with left axis deviation and minimal LVH.   Recent Labs: 09/17/2019: ALT 8 10/11/2019: BUN 16; Creatinine, Ser 1.56; Hemoglobin 9.0; Magnesium 1.5; Platelets 285; Potassium 2.9; Sodium 128    Lipid Panel    Component Value Date/Time   CHOL 121 02/07/2019 0900   CHOL 204 (H) 11/24/2015 0938   TRIG 53  02/07/2019 0900   HDL 48 (L) 02/07/2019 0900   HDL 53 11/24/2015 0938   CHOLHDL 2.5 02/07/2019 0900   VLDL 24 02/17/2017 2355   LDLCALC 60 02/07/2019 0900      Wt Readings from Last 3 Encounters:  10/22/19 137 lb (62.1 kg)  10/18/19 137 lb 9.6 oz (62.4 kg)  10/17/19 138 lb (62.6 kg)       No flowsheet data found.    ASSESSMENT AND PLAN:  1.  Peripheral arterial disease with critical limb ischemia with tissue loss (Rutherford class V).  Patient is at high risk for limb loss and she understands this.  This is an urgent situation.  I recommend proceeding with abdominal aortogram with lower extremity runoff and possible endovascular intervention.  She is already on Plavix for previous stroke.  Planned access is via the left common femoral artery but if we have difficulty due to iliac disease, we might need to consider left brachial access.  I discussed the procedure in details as well as risks and benefits.  Hydrate before the procedure given chronic kidney disease.  2.  Essential hypertension: Blood pressure is mildly elevated.  3.  Hyperlipidemia: Continue treatment with atorvastatin.    Disposition:   FU with me in 1 month  Signed,   Kathlyn Sacramento, MD  10/22/2019 10:55 AM    [place]

## 2019-10-22 NOTE — Telephone Encounter (Signed)
Patient returning call  Made patient aware of COVID drive up testing at [facility] on 11/2 at 9:30a Patient acknowledged understanding If any other information needed, please call

## 2019-10-22 NOTE — Telephone Encounter (Signed)
Noted. No further action required.

## 2019-10-22 NOTE — Patient Instructions (Signed)
Medication Instructions:  Your physician recommends that you continue on your current medications as directed. Please refer to the Current Medication list given to you today.  *If you need a refill on your cardiac medications before your next appointment, please call your pharmacy*  Lab Work: Bmet and Cbc today.  You will need a COVID test prior your procedure. It will need to be performed on 10/26/19 @ the [facility] drive test site.   If you have labs (blood work) drawn today and your tests are completely normal, you will receive your results only by: Marland Kitchen MyChart Message (if you have MyChart) OR . A paper copy in the mail If you have any lab test that is abnormal or we need to change your treatment, we will call you to review the results.  Testing/Procedures: Your physician has requested that you have a peripheral vascular angiogram. This exam is performed at the hospital. During this exam IV contrast is used to look at arterial blood flow. Please review the information sheet given for details.    Follow-Up: At [name], you and your health needs are our priority.  As part of our continuing mission to provide you with exceptional heart care, we have created designated Provider Care Teams.  These Care Teams include your primary Cardiologist (physician) and Advanced Practice Providers (APPs -  Physician Assistants and Nurse Practitioners) who all work together to provide you with the care you need, when you need it.  Your next appointment:   After the procedure  The format for your next appointment:   In Person  Provider:   You may see Dr. Fletcher Anon or one of the following Advanced Practice Providers on your designated Care Team:    Kerin Ransom, PA-C  [place], [place]  Coletta Memos, [place]   Other Instructions    [place] [address] Torrie Mayers [place] [address] Dept:  [place]: [place]  10/22/2019  You are scheduled for a Peripheral Angiogram on Wednesday, November 4 with Dr. Kathlyn [place].  1. Please arrive at the [facility] (Main Entrance A) at [facility]: [address] [place], [place] [address] at 8:30 AM (This time is two hours before your procedure to ensure your preparation). Free valet parking service is available.   Special note: Every effort is made to have your procedure done on time. Please understand that emergencies sometimes delay scheduled procedures.  2. Diet: Do not eat solid foods after midnight.  The patient may have clear liquids until 5am upon the day of the procedure.  3. Labs: You will need to have blood drawn today.  You will need a COVID test at the [facility] drive up test site on 10/26/19  4. Medication instructions in preparation for your procedure:   Contrast Allergy: No   Current Outpatient Medications (Endocrine & Metabolic):  Marland Kitchen  Insulin Glargine (LANTUS) 100 UNIT/ML Solostar Pen, INJECT 48 UNITS UNDER THE SKIN DAILY FOR DIABETES (Patient taking differently: Inject 48 Units into the skin daily. ) .  insulin lispro (HUMALOG KWIKPEN) 100 UNIT/ML KwikPen, Inject 0.03 mLs (3 Units total) into the skin 3 (three) times daily.  Current Outpatient Medications (Cardiovascular):  .  amLODipine (NORVASC) 10 MG tablet, TAKE 1 TABLET (10 MG TOTAL) BY MOUTH DAILY. FOR HIGH BLOOD PRESSURE .  atorvastatin (LIPITOR) 20 MG tablet, TAKE 1 TABLET BY MOUTH EVERY DAY (Patient taking differently: Take 20  mg by mouth daily. ) .  carvedilol (COREG) 25 MG tablet, TAKE 1 TABLET BY MOUTH TWICE A DAY WITH A MEAL (Patient taking differently: Take 25 mg by mouth 2 (two) times daily with a meal. ) .  valsartan-hydrochlorothiazide (DIOVAN-HCT) 320-25 MG tablet, TAKE 1 TABLET BY MOUTH EVERY DAY (Patient taking differently: Take 1 tablet by mouth daily. )    Current Outpatient Medications  (Hematological):  .  clopidogrel (PLAVIX) 75 MG tablet, TAKE ONE TABLET BY MOUTH ONCE DAILY  Current Outpatient Medications (Other):  .  doxycycline (VIBRA-TABS) 100 MG tablet, Take 100 mg by mouth 2 (two) times daily. Marland Kitchen  gabapentin (NEURONTIN) 100 MG capsule, Take 1 capsule (100 mg total) by mouth 3 (three) times daily. .  Insulin Pen Needle (B-D ULTRAFINE III SHORT PEN) 31G X 8 MM MISC, Use to check blood sugar every day. Dx: 11.29; 11.65 .  ONETOUCH VERIO test strip, USE TO TEST BLOOD SUGAR THREE TIMES DAILY. DX: E11.9 .  potassium chloride SA (KLOR-CON) 20 MEQ tablet, Take 1 tablet (20 mEq total) by mouth 2 (two) times daily for 3 days. *For reference purposes while preparing patient instructions.   Delete this med list prior to printing instructions for patient.*  Hold your  Valsartan HCTZ the morning of your procedure.  Take 1/2 of your insulin 1.5 units the night before the procedure. HOLD your insulin the morning of the procedure.    On the morning of your procedure, take your Plavix/Clopidogrel and any morning medicines NOT listed above.  You may use sips of water.  5. Plan for one night stay--bring personal belongings. 6. Bring a current list of your medications and current insurance cards. 7. You MUST have a responsible person to drive you home. 8. Someone MUST be with you the first 24 hours after you arrive home or your discharge will be delayed. 9. Please wear clothes that are easy to get on and off and wear slip-on shoes.  Thank you for allowing [place] to care for you!   --  Invasive Cardiovascular services

## 2019-10-23 ENCOUNTER — Encounter (HOSPITAL_BASED_OUTPATIENT_CLINIC_OR_DEPARTMENT_OTHER): Payer: Medicare Other | Attending: Internal Medicine | Admitting: Internal Medicine

## 2019-10-23 ENCOUNTER — Other Ambulatory Visit: Payer: Self-pay | Admitting: Internal Medicine

## 2019-10-23 ENCOUNTER — Other Ambulatory Visit: Payer: Self-pay

## 2019-10-23 DIAGNOSIS — D631 Anemia in chronic kidney disease: Secondary | ICD-10-CM | POA: Diagnosis not present

## 2019-10-23 DIAGNOSIS — M79671 Pain in right foot: Secondary | ICD-10-CM

## 2019-10-23 DIAGNOSIS — E1122 Type 2 diabetes mellitus with diabetic chronic kidney disease: Secondary | ICD-10-CM | POA: Diagnosis not present

## 2019-10-23 DIAGNOSIS — Z87890 Personal history of sex reassignment: Secondary | ICD-10-CM | POA: Insufficient documentation

## 2019-10-23 DIAGNOSIS — E11621 Type 2 diabetes mellitus with foot ulcer: Secondary | ICD-10-CM | POA: Diagnosis not present

## 2019-10-23 DIAGNOSIS — Z794 Long term (current) use of insulin: Secondary | ICD-10-CM | POA: Insufficient documentation

## 2019-10-23 DIAGNOSIS — I129 Hypertensive chronic kidney disease with stage 1 through stage 4 chronic kidney disease, or unspecified chronic kidney disease: Secondary | ICD-10-CM | POA: Insufficient documentation

## 2019-10-23 DIAGNOSIS — I96 Gangrene, not elsewhere classified: Secondary | ICD-10-CM | POA: Diagnosis not present

## 2019-10-23 DIAGNOSIS — E11319 Type 2 diabetes mellitus with unspecified diabetic retinopathy without macular edema: Secondary | ICD-10-CM | POA: Insufficient documentation

## 2019-10-23 DIAGNOSIS — E1151 Type 2 diabetes mellitus with diabetic peripheral angiopathy without gangrene: Secondary | ICD-10-CM | POA: Insufficient documentation

## 2019-10-23 DIAGNOSIS — I739 Peripheral vascular disease, unspecified: Secondary | ICD-10-CM

## 2019-10-23 DIAGNOSIS — Z4800 Encounter for change or removal of nonsurgical wound dressing: Secondary | ICD-10-CM | POA: Diagnosis not present

## 2019-10-23 DIAGNOSIS — L97512 Non-pressure chronic ulcer of other part of right foot with fat layer exposed: Secondary | ICD-10-CM | POA: Insufficient documentation

## 2019-10-23 DIAGNOSIS — E1140 Type 2 diabetes mellitus with diabetic neuropathy, unspecified: Secondary | ICD-10-CM | POA: Diagnosis not present

## 2019-10-23 DIAGNOSIS — N182 Chronic kidney disease, stage 2 (mild): Secondary | ICD-10-CM | POA: Insufficient documentation

## 2019-10-23 DIAGNOSIS — I70219 Atherosclerosis of native arteries of extremities with intermittent claudication, unspecified extremity: Secondary | ICD-10-CM | POA: Diagnosis not present

## 2019-10-23 DIAGNOSIS — E785 Hyperlipidemia, unspecified: Secondary | ICD-10-CM | POA: Diagnosis not present

## 2019-10-23 DIAGNOSIS — E113593 Type 2 diabetes mellitus with proliferative diabetic retinopathy without macular edema, bilateral: Secondary | ICD-10-CM | POA: Diagnosis not present

## 2019-10-23 DIAGNOSIS — H8110 Benign paroxysmal vertigo, unspecified ear: Secondary | ICD-10-CM | POA: Diagnosis not present

## 2019-10-23 DIAGNOSIS — L97519 Non-pressure chronic ulcer of other part of right foot with unspecified severity: Secondary | ICD-10-CM | POA: Diagnosis not present

## 2019-10-23 LAB — CBC WITH DIFFERENTIAL/PLATELET
Basophils Absolute: 0.1 10*3/uL (ref 0.0–0.2)
Basos: 0 %
EOS (ABSOLUTE): 0.1 10*3/uL (ref 0.0–0.4)
Eos: 0 %
Hematocrit: 26.7 % — ABNORMAL LOW (ref 34.0–46.6)
Hemoglobin: 8.6 g/dL — ABNORMAL LOW (ref 11.1–15.9)
Immature Grans (Abs): 0.3 10*3/uL — ABNORMAL HIGH (ref 0.0–0.1)
Immature Granulocytes: 2 %
Lymphocytes Absolute: 1.6 10*3/uL (ref 0.7–3.1)
Lymphs: 8 %
MCH: 29.3 pg (ref 26.6–33.0)
MCHC: 32.2 g/dL (ref 31.5–35.7)
MCV: 91 fL (ref 79–97)
Monocytes Absolute: 1.5 10*3/uL — ABNORMAL HIGH (ref 0.1–0.9)
Monocytes: 8 %
Neutrophils Absolute: 15.9 10*3/uL — ABNORMAL HIGH (ref 1.4–7.0)
Neutrophils: 82 %
Platelets: 508 10*3/uL — ABNORMAL HIGH (ref 150–450)
RBC: 2.94 x10E6/uL — ABNORMAL LOW (ref 3.77–5.28)
RDW: 12 % (ref 11.7–15.4)
WBC: 19.5 10*3/uL — ABNORMAL HIGH (ref 3.4–10.8)

## 2019-10-23 LAB — BASIC METABOLIC PANEL
BUN/Creatinine Ratio: 15 (ref 12–28)
BUN: 17 mg/dL (ref 8–27)
CO2: 23 mmol/L (ref 20–29)
Calcium: 10.5 mg/dL — ABNORMAL HIGH (ref 8.7–10.3)
Chloride: 91 mmol/L — ABNORMAL LOW (ref 96–106)
Creatinine, Ser: 1.12 mg/dL — ABNORMAL HIGH (ref 0.57–1.00)
GFR calc Af Amer: 53 mL/min/{1.73_m2} — ABNORMAL LOW (ref >59)
GFR calc non Af Amer: 46 mL/min/{1.73_m2} — ABNORMAL LOW (ref >59)
Glucose: 251 mg/dL — ABNORMAL HIGH (ref 65–99)
Potassium: 4.8 mmol/L (ref 3.5–5.2)
Sodium: 128 mmol/L — ABNORMAL LOW (ref 134–144)

## 2019-10-23 NOTE — Patient Outreach (Signed)
[place] [facility]) Care Management  10/23/2019  Monique Cabrera 03-27-1935 [id number]   Completed request for pcs faxed to [name].  Will follow up in 48 hours to ensure receipt and that request is being processed.  Ronn Melena, BSW Social Worker [phone]

## 2019-10-23 NOTE — Telephone Encounter (Signed)
Dr. Dellia Nims contacted me after he saw Monique Cabrera in wound care center today.  Her distal foot has developed worsening dry gangrene and her pain is severe.  The gabapentin I prescribed was ineffective.  He ordered some tramadol, but expects it will not be adequate.  I agreed to prescribe her some norco 5/325mg  and she may take one every 6 hrs as needed for severe pain.  She should not be getting up trying to walk after taking it b/c it is a strong medication and can make her prone to falls.  She would benefit from assistance from her family until she can get her procedure next week.  I didn't want to send the prescription until we spoke to her to let her know that my prescription will replace both the gabapentin and the tramadol (don't want her taking all of it and passing out!)  Let me know when you've spoken with her and I will sign it.

## 2019-10-23 NOTE — Telephone Encounter (Signed)
I had completed the form last week.  I opted to finish and sign again since it was oddly never received though we did not get a rejection on the fax.

## 2019-10-24 ENCOUNTER — Telehealth: Payer: Self-pay

## 2019-10-24 DIAGNOSIS — E113593 Type 2 diabetes mellitus with proliferative diabetic retinopathy without macular edema, bilateral: Secondary | ICD-10-CM | POA: Diagnosis not present

## 2019-10-24 DIAGNOSIS — H8110 Benign paroxysmal vertigo, unspecified ear: Secondary | ICD-10-CM | POA: Diagnosis not present

## 2019-10-24 DIAGNOSIS — I70219 Atherosclerosis of native arteries of extremities with intermittent claudication, unspecified extremity: Secondary | ICD-10-CM | POA: Diagnosis not present

## 2019-10-24 DIAGNOSIS — D631 Anemia in chronic kidney disease: Secondary | ICD-10-CM | POA: Diagnosis not present

## 2019-10-24 DIAGNOSIS — E1122 Type 2 diabetes mellitus with diabetic chronic kidney disease: Secondary | ICD-10-CM | POA: Diagnosis not present

## 2019-10-24 DIAGNOSIS — N182 Chronic kidney disease, stage 2 (mild): Secondary | ICD-10-CM | POA: Diagnosis not present

## 2019-10-24 DIAGNOSIS — Z4800 Encounter for change or removal of nonsurgical wound dressing: Secondary | ICD-10-CM | POA: Diagnosis not present

## 2019-10-24 DIAGNOSIS — E11621 Type 2 diabetes mellitus with foot ulcer: Secondary | ICD-10-CM | POA: Diagnosis not present

## 2019-10-24 DIAGNOSIS — E785 Hyperlipidemia, unspecified: Secondary | ICD-10-CM | POA: Diagnosis not present

## 2019-10-24 DIAGNOSIS — I129 Hypertensive chronic kidney disease with stage 1 through stage 4 chronic kidney disease, or unspecified chronic kidney disease: Secondary | ICD-10-CM | POA: Diagnosis not present

## 2019-10-24 DIAGNOSIS — L97512 Non-pressure chronic ulcer of other part of right foot with fat layer exposed: Secondary | ICD-10-CM | POA: Diagnosis not present

## 2019-10-24 DIAGNOSIS — E1151 Type 2 diabetes mellitus with diabetic peripheral angiopathy without gangrene: Secondary | ICD-10-CM | POA: Diagnosis not present

## 2019-10-24 MED ORDER — HYDROCODONE-ACETAMINOPHEN 5-325 MG PO TABS: 1.0000 | ORAL_TABLET | Freq: Four times a day (QID) | ORAL | 0 refills | Status: DC | PRN

## 2019-10-24 NOTE — Telephone Encounter (Signed)
Angel from [place] called to report patient BS reading was 347. Patient told nurse that she had forgot to check this morning. Patient had already eaten breakfast and some grapes. Nurse states patient's vitals are normal and patient is asymptomatic. She was given 48 units of Lantus and 3 units of Humalog. States patient is drinking water and will recheck bs in 30 minutes. Nurse would like to know what to do if patient's bs has not come down after 30 minutes or if BS spikes again. Please advise.

## 2019-10-24 NOTE — Telephone Encounter (Signed)
Patient Notified and agreed. Pended and forwarded to Dr. Mariea Clonts for approval.

## 2019-10-24 NOTE — Telephone Encounter (Signed)
She is prone to hypoglycemia which is more dangerous for her.  I would not give her additional short-acting insulin if her sugar remains high.  She also has her gangrenous foot which is likely causing her high sugars.

## 2019-10-24 NOTE — Telephone Encounter (Signed)
Called and spoke with patient. She states her blood sugar is coming down. I informed her to not take any fast acting insulin if bs remains high. She verbalized understanding. She checked while on the phone with me and reading was 314. Informed patient to call us if she had any more concerns or if bs got too low.

## 2019-10-25 ENCOUNTER — Other Ambulatory Visit: Payer: Self-pay

## 2019-10-25 ENCOUNTER — Other Ambulatory Visit: Payer: Self-pay | Admitting: Internal Medicine

## 2019-10-25 DIAGNOSIS — L97521 Non-pressure chronic ulcer of other part of left foot limited to breakdown of skin: Secondary | ICD-10-CM | POA: Diagnosis not present

## 2019-10-25 DIAGNOSIS — L89899 Pressure ulcer of other site, unspecified stage: Secondary | ICD-10-CM | POA: Diagnosis not present

## 2019-10-25 NOTE — Patient Outreach (Signed)
[facility] [facility]) Care Management  10/25/2019  Monique Cabrera October 01, 1935 [id number]   Successful follow up call to patient today.  Per patient, she has been contacted by representative from [facility] but assessment for pcs was not completed during call.  Per patient, she is supposed to receive a call back but could not recall when.  Will follow up with patient again next week.  If she has not been contacted again by [facility] at that point, will recommend that she call them to follow up.  Ronn Melena, BSW Social Worker [phone]

## 2019-10-29 ENCOUNTER — Emergency Department (HOSPITAL_COMMUNITY): Payer: Medicare Other

## 2019-10-29 ENCOUNTER — Other Ambulatory Visit: Payer: Self-pay

## 2019-10-29 ENCOUNTER — Inpatient Hospital Stay (HOSPITAL_COMMUNITY)
Admission: EM | Admit: 2019-10-29 | Discharge: 2019-11-04 | DRG: 854 | Disposition: A | Discharge status: Home Health | Payer: Medicare Other | Attending: Family Medicine | Admitting: Family Medicine

## 2019-10-29 ENCOUNTER — Encounter (HOSPITAL_COMMUNITY): Payer: Self-pay | Admitting: Emergency Medicine

## 2019-10-29 ENCOUNTER — Other Ambulatory Visit: Payer: Medicare Other

## 2019-10-29 ENCOUNTER — Inpatient Hospital Stay (HOSPITAL_COMMUNITY): Admission: RE | Admit: 2019-10-29 | Payer: Medicare Other | Source: Ambulatory Visit

## 2019-10-29 DIAGNOSIS — B182 Chronic viral hepatitis C: Secondary | ICD-10-CM | POA: Diagnosis present

## 2019-10-29 DIAGNOSIS — Z87891 Personal history of nicotine dependence: Secondary | ICD-10-CM

## 2019-10-29 DIAGNOSIS — H8110 Benign paroxysmal vertigo, unspecified ear: Secondary | ICD-10-CM | POA: Diagnosis not present

## 2019-10-29 DIAGNOSIS — E1152 Type 2 diabetes mellitus with diabetic peripheral angiopathy with gangrene: Secondary | ICD-10-CM | POA: Diagnosis present

## 2019-10-29 DIAGNOSIS — E876 Hypokalemia: Secondary | ICD-10-CM | POA: Diagnosis present

## 2019-10-29 DIAGNOSIS — N179 Acute kidney failure, unspecified: Secondary | ICD-10-CM | POA: Diagnosis not present

## 2019-10-29 DIAGNOSIS — E872 Acidosis: Secondary | ICD-10-CM | POA: Diagnosis not present

## 2019-10-29 DIAGNOSIS — I70261 Atherosclerosis of native arteries of extremities with gangrene, right leg: Secondary | ICD-10-CM | POA: Diagnosis not present

## 2019-10-29 DIAGNOSIS — I129 Hypertensive chronic kidney disease with stage 1 through stage 4 chronic kidney disease, or unspecified chronic kidney disease: Secondary | ICD-10-CM | POA: Diagnosis not present

## 2019-10-29 DIAGNOSIS — Z743 Need for continuous supervision: Secondary | ICD-10-CM | POA: Diagnosis not present

## 2019-10-29 DIAGNOSIS — L97512 Non-pressure chronic ulcer of other part of right foot with fat layer exposed: Secondary | ICD-10-CM | POA: Diagnosis not present

## 2019-10-29 DIAGNOSIS — E11621 Type 2 diabetes mellitus with foot ulcer: Secondary | ICD-10-CM | POA: Diagnosis not present

## 2019-10-29 DIAGNOSIS — E871 Hypo-osmolality and hyponatremia: Secondary | ICD-10-CM | POA: Diagnosis not present

## 2019-10-29 DIAGNOSIS — G8918 Other acute postprocedural pain: Secondary | ICD-10-CM | POA: Diagnosis not present

## 2019-10-29 DIAGNOSIS — H5122 Internuclear ophthalmoplegia, left eye: Secondary | ICD-10-CM | POA: Diagnosis present

## 2019-10-29 DIAGNOSIS — A419 Sepsis, unspecified organism: Secondary | ICD-10-CM | POA: Diagnosis not present

## 2019-10-29 DIAGNOSIS — I10 Essential (primary) hypertension: Secondary | ICD-10-CM

## 2019-10-29 DIAGNOSIS — E1129 Type 2 diabetes mellitus with other diabetic kidney complication: Secondary | ICD-10-CM | POA: Diagnosis present

## 2019-10-29 DIAGNOSIS — E1165 Type 2 diabetes mellitus with hyperglycemia: Secondary | ICD-10-CM | POA: Diagnosis not present

## 2019-10-29 DIAGNOSIS — E119 Type 2 diabetes mellitus without complications: Secondary | ICD-10-CM | POA: Diagnosis not present

## 2019-10-29 DIAGNOSIS — Z888 Allergy status to other drugs, medicaments and biological substances status: Secondary | ICD-10-CM

## 2019-10-29 DIAGNOSIS — D649 Anemia, unspecified: Secondary | ICD-10-CM | POA: Diagnosis present

## 2019-10-29 DIAGNOSIS — I69311 Memory deficit following cerebral infarction: Secondary | ICD-10-CM | POA: Diagnosis not present

## 2019-10-29 DIAGNOSIS — I70211 Atherosclerosis of native arteries of extremities with intermittent claudication, right leg: Secondary | ICD-10-CM | POA: Diagnosis not present

## 2019-10-29 DIAGNOSIS — I739 Peripheral vascular disease, unspecified: Secondary | ICD-10-CM | POA: Diagnosis present

## 2019-10-29 DIAGNOSIS — R4781 Slurred speech: Secondary | ICD-10-CM | POA: Diagnosis present

## 2019-10-29 DIAGNOSIS — I5032 Chronic diastolic (congestive) heart failure: Secondary | ICD-10-CM | POA: Diagnosis present

## 2019-10-29 DIAGNOSIS — M86171 Other acute osteomyelitis, right ankle and foot: Secondary | ICD-10-CM | POA: Diagnosis not present

## 2019-10-29 DIAGNOSIS — I959 Hypotension, unspecified: Secondary | ICD-10-CM | POA: Diagnosis not present

## 2019-10-29 DIAGNOSIS — I70213 Atherosclerosis of native arteries of extremities with intermittent claudication, bilateral legs: Secondary | ICD-10-CM | POA: Diagnosis not present

## 2019-10-29 DIAGNOSIS — Z20828 Contact with and (suspected) exposure to other viral communicable diseases: Secondary | ICD-10-CM | POA: Diagnosis present

## 2019-10-29 DIAGNOSIS — L089 Local infection of the skin and subcutaneous tissue, unspecified: Secondary | ICD-10-CM | POA: Diagnosis not present

## 2019-10-29 DIAGNOSIS — E1151 Type 2 diabetes mellitus with diabetic peripheral angiopathy without gangrene: Secondary | ICD-10-CM | POA: Diagnosis not present

## 2019-10-29 DIAGNOSIS — I96 Gangrene, not elsewhere classified: Secondary | ICD-10-CM

## 2019-10-29 DIAGNOSIS — M869 Osteomyelitis, unspecified: Secondary | ICD-10-CM | POA: Diagnosis present

## 2019-10-29 DIAGNOSIS — I130 Hypertensive heart and chronic kidney disease with heart failure and stage 1 through stage 4 chronic kidney disease, or unspecified chronic kidney disease: Secondary | ICD-10-CM | POA: Diagnosis not present

## 2019-10-29 DIAGNOSIS — S81801A Unspecified open wound, right lower leg, initial encounter: Secondary | ICD-10-CM | POA: Diagnosis present

## 2019-10-29 DIAGNOSIS — F0150 Vascular dementia without behavioral disturbance: Secondary | ICD-10-CM | POA: Diagnosis present

## 2019-10-29 DIAGNOSIS — E1122 Type 2 diabetes mellitus with diabetic chronic kidney disease: Secondary | ICD-10-CM | POA: Diagnosis not present

## 2019-10-29 DIAGNOSIS — Z833 Family history of diabetes mellitus: Secondary | ICD-10-CM

## 2019-10-29 DIAGNOSIS — Z4800 Encounter for change or removal of nonsurgical wound dressing: Secondary | ICD-10-CM | POA: Diagnosis not present

## 2019-10-29 DIAGNOSIS — Z794 Long term (current) use of insulin: Secondary | ICD-10-CM

## 2019-10-29 DIAGNOSIS — I70219 Atherosclerosis of native arteries of extremities with intermittent claudication, unspecified extremity: Secondary | ICD-10-CM | POA: Diagnosis present

## 2019-10-29 DIAGNOSIS — Z79899 Other long term (current) drug therapy: Secondary | ICD-10-CM | POA: Diagnosis not present

## 2019-10-29 DIAGNOSIS — E785 Hyperlipidemia, unspecified: Secondary | ICD-10-CM | POA: Diagnosis present

## 2019-10-29 DIAGNOSIS — I70209 Unspecified atherosclerosis of native arteries of extremities, unspecified extremity: Secondary | ICD-10-CM | POA: Diagnosis present

## 2019-10-29 DIAGNOSIS — E162 Hypoglycemia, unspecified: Secondary | ICD-10-CM | POA: Diagnosis present

## 2019-10-29 DIAGNOSIS — M7989 Other specified soft tissue disorders: Secondary | ICD-10-CM | POA: Diagnosis not present

## 2019-10-29 DIAGNOSIS — N189 Chronic kidney disease, unspecified: Secondary | ICD-10-CM | POA: Diagnosis not present

## 2019-10-29 DIAGNOSIS — N1831 Chronic kidney disease, stage 3a: Secondary | ICD-10-CM

## 2019-10-29 DIAGNOSIS — Z7902 Long term (current) use of antithrombotics/antiplatelets: Secondary | ICD-10-CM

## 2019-10-29 DIAGNOSIS — R52 Pain, unspecified: Secondary | ICD-10-CM | POA: Diagnosis not present

## 2019-10-29 DIAGNOSIS — I639 Cerebral infarction, unspecified: Secondary | ICD-10-CM | POA: Diagnosis not present

## 2019-10-29 DIAGNOSIS — Z03818 Encounter for observation for suspected exposure to other biological agents ruled out: Secondary | ICD-10-CM | POA: Diagnosis not present

## 2019-10-29 DIAGNOSIS — R404 Transient alteration of awareness: Secondary | ICD-10-CM | POA: Diagnosis not present

## 2019-10-29 DIAGNOSIS — N182 Chronic kidney disease, stage 2 (mild): Secondary | ICD-10-CM | POA: Diagnosis present

## 2019-10-29 DIAGNOSIS — I70238 Atherosclerosis of native arteries of right leg with ulceration of other part of lower right leg: Secondary | ICD-10-CM | POA: Diagnosis not present

## 2019-10-29 DIAGNOSIS — D631 Anemia in chronic kidney disease: Secondary | ICD-10-CM | POA: Diagnosis present

## 2019-10-29 DIAGNOSIS — E7800 Pure hypercholesterolemia, unspecified: Secondary | ICD-10-CM | POA: Diagnosis not present

## 2019-10-29 DIAGNOSIS — E860 Dehydration: Secondary | ICD-10-CM | POA: Diagnosis present

## 2019-10-29 DIAGNOSIS — N1832 Chronic kidney disease, stage 3b: Secondary | ICD-10-CM | POA: Diagnosis not present

## 2019-10-29 DIAGNOSIS — E113593 Type 2 diabetes mellitus with proliferative diabetic retinopathy without macular edema, bilateral: Secondary | ICD-10-CM | POA: Diagnosis not present

## 2019-10-29 LAB — CBC
HCT: 23.6 % — ABNORMAL LOW (ref 36.0–46.0)
Hemoglobin: 7.5 g/dL — ABNORMAL LOW (ref 12.0–15.0)
MCH: 29.9 pg (ref 26.0–34.0)
MCHC: 31.8 g/dL (ref 30.0–36.0)
MCV: 94 fL (ref 80.0–100.0)
Platelets: 454 10*3/uL — ABNORMAL HIGH (ref 150–400)
RBC: 2.51 MIL/uL — ABNORMAL LOW (ref 3.87–5.11)
RDW: 13.5 % (ref 11.5–15.5)
WBC: 25.6 10*3/uL — ABNORMAL HIGH (ref 4.0–10.5)
nRBC: 0 % (ref 0.0–0.2)

## 2019-10-29 LAB — LACTIC ACID, PLASMA: Lactic Acid, Venous: 1.4 mmol/L (ref 0.5–1.9)

## 2019-10-29 LAB — CBG MONITORING, ED
Glucose-Capillary: 321 mg/dL — ABNORMAL HIGH (ref 70–99)
Glucose-Capillary: 325 mg/dL — ABNORMAL HIGH (ref 70–99)
Glucose-Capillary: 321 mg/dL — ABNORMAL HIGH (ref 70–99)

## 2019-10-29 LAB — COMPREHENSIVE METABOLIC PANEL
ALT: 19 U/L (ref 0–44)
AST: 26 U/L (ref 15–41)
Albumin: 1.9 g/dL — ABNORMAL LOW (ref 3.5–5.0)
Alkaline Phosphatase: 68 U/L (ref 38–126)
Anion gap: 14 (ref 5–15)
BUN: 31 mg/dL — ABNORMAL HIGH (ref 8–23)
CO2: 19 mmol/L — ABNORMAL LOW (ref 22–32)
Calcium: 10.2 mg/dL (ref 8.9–10.3)
Chloride: 92 mmol/L — ABNORMAL LOW (ref 98–111)
Creatinine, Ser: 1.68 mg/dL — ABNORMAL HIGH (ref 0.44–1.00)
GFR calc Af Amer: 32 mL/min — ABNORMAL LOW (ref >60)
GFR calc non Af Amer: 28 mL/min — ABNORMAL LOW (ref >60)
Glucose, Bld: 328 mg/dL — ABNORMAL HIGH (ref 70–99)
Potassium: 4.2 mmol/L (ref 3.5–5.1)
Sodium: 125 mmol/L — ABNORMAL LOW (ref 135–145)
Total Bilirubin: 1.1 mg/dL (ref 0.3–1.2)
Total Protein: 6.3 g/dL — ABNORMAL LOW (ref 6.5–8.1)

## 2019-10-29 LAB — RETICULOCYTES
Immature Retic Fract: 20 % — ABNORMAL HIGH (ref 2.3–15.9)
RBC.: 2.17 MIL/uL — ABNORMAL LOW (ref 3.87–5.11)
Retic Count, Absolute: 27.1 10*3/uL (ref 19.0–186.0)
Retic Ct Pct: 1.3 % (ref 0.4–3.1)

## 2019-10-29 LAB — IRON AND TIBC
Iron: 14 ug/dL — ABNORMAL LOW (ref 28–170)
Saturation Ratios: 11 % (ref 10.4–31.8)
TIBC: 122 ug/dL — ABNORMAL LOW (ref 250–450)
UIBC: 108 ug/dL

## 2019-10-29 LAB — FERRITIN: Ferritin: 1801 ng/mL — ABNORMAL HIGH (ref 11–307)

## 2019-10-29 LAB — PREALBUMIN: Prealbumin: <5 mg/dL — ABNORMAL LOW (ref 18–38)

## 2019-10-29 LAB — VITAMIN B12: Vitamin B-12: 921 pg/mL — ABNORMAL HIGH (ref 180–914)

## 2019-10-29 LAB — SARS CORONAVIRUS 2 (TAT 6-24 HRS): SARS Coronavirus 2: NEGATIVE

## 2019-10-29 LAB — C-REACTIVE PROTEIN: CRP: 24.1 mg/dL — ABNORMAL HIGH (ref <1.0)

## 2019-10-29 LAB — FOLATE: Folate: 12.4 ng/mL (ref >5.9)

## 2019-10-29 LAB — SEDIMENTATION RATE: Sed Rate: >140 mm/hr — ABNORMAL HIGH (ref 0–22)

## 2019-10-29 MED ORDER — CLOPIDOGREL BISULFATE 75 MG PO TABS
75.0000 mg | ORAL_TABLET | Freq: Every day | ORAL | Status: DC
  Administered 2019-10-30 – 2019-11-04 (×6): 75 mg via ORAL
  Filled 2019-10-29 (×6): qty 1

## 2019-10-29 MED ORDER — VANCOMYCIN HCL IN DEXTROSE 1-5 GM/200ML-% IV SOLN: 1000.0000 mg | Freq: Once | INTRAVENOUS | Status: DC

## 2019-10-29 MED ORDER — SODIUM CHLORIDE 0.9 % IV SOLN
2.0000 g | Freq: Once | INTRAVENOUS | Status: AC
  Administered 2019-10-29: 2 g via INTRAVENOUS
  Filled 2019-10-29: qty 20

## 2019-10-29 MED ORDER — SODIUM CHLORIDE 0.9% FLUSH
3.0000 mL | Freq: Once | INTRAVENOUS | Status: AC
  Administered 2019-10-29: 3 mL via INTRAVENOUS

## 2019-10-29 MED ORDER — POLYETHYLENE GLYCOL 3350 17 G PO PACK: 17.0000 g | PACK | Freq: Every day | ORAL | Status: DC | PRN

## 2019-10-29 MED ORDER — ATORVASTATIN CALCIUM 10 MG PO TABS
20.0000 mg | ORAL_TABLET | Freq: Every day | ORAL | Status: DC
  Administered 2019-10-30 – 2019-11-04 (×6): 20 mg via ORAL
  Filled 2019-10-29 (×6): qty 2

## 2019-10-29 MED ORDER — SODIUM CHLORIDE 0.9 % IV SOLN
2.0000 g | INTRAVENOUS | Status: DC
  Administered 2019-10-30 – 2019-11-03 (×5): 2 g via INTRAVENOUS
  Filled 2019-10-29 (×3): qty 20
  Filled 2019-10-29 (×2): qty 2
  Filled 2019-10-29: qty 20

## 2019-10-29 MED ORDER — METRONIDAZOLE IN NACL 5-0.79 MG/ML-% IV SOLN
500.0000 mg | Freq: Three times a day (TID) | INTRAVENOUS | Status: DC
  Administered 2019-10-30 – 2019-11-03 (×16): 500 mg via INTRAVENOUS
  Filled 2019-10-29 (×17): qty 100

## 2019-10-29 MED ORDER — SODIUM CHLORIDE 0.9 % IV SOLN
INTRAVENOUS | Status: AC
  Administered 2019-10-30: 01:00:00 via INTRAVENOUS

## 2019-10-29 MED ORDER — ACETAMINOPHEN 650 MG RE SUPP: 650.0000 mg | Freq: Four times a day (QID) | RECTAL | Status: DC | PRN

## 2019-10-29 MED ORDER — ONDANSETRON HCL 4 MG PO TABS: 4.0000 mg | ORAL_TABLET | Freq: Four times a day (QID) | ORAL | Status: DC | PRN

## 2019-10-29 MED ORDER — AMLODIPINE BESYLATE 10 MG PO TABS
10.0000 mg | ORAL_TABLET | Freq: Every day | ORAL | Status: DC
  Administered 2019-10-30 – 2019-11-04 (×7): 10 mg via ORAL
  Filled 2019-10-29 (×7): qty 1

## 2019-10-29 MED ORDER — HYDROCODONE-ACETAMINOPHEN 5-325 MG PO TABS
1.0000 | ORAL_TABLET | ORAL | Status: DC | PRN
  Administered 2019-10-30 – 2019-11-03 (×6): 1 via ORAL
  Filled 2019-10-29 (×7): qty 1

## 2019-10-29 MED ORDER — SODIUM CHLORIDE 0.9 % IV BOLUS
1000.0000 mL | Freq: Once | INTRAVENOUS | Status: AC
  Administered 2019-10-29: 1000 mL via INTRAVENOUS

## 2019-10-29 MED ORDER — CARVEDILOL 25 MG PO TABS
25.0000 mg | ORAL_TABLET | Freq: Two times a day (BID) | ORAL | Status: DC
  Administered 2019-10-30: 25 mg via ORAL
  Filled 2019-10-29 (×2): qty 1

## 2019-10-29 MED ORDER — VANCOMYCIN HCL IN DEXTROSE 1-5 GM/200ML-% IV SOLN
1000.0000 mg | INTRAVENOUS | Status: DC
  Administered 2019-10-29: 1000 mg via INTRAVENOUS
  Filled 2019-10-29: qty 200

## 2019-10-29 MED ORDER — ACETAMINOPHEN 325 MG PO TABS: 650.0000 mg | ORAL_TABLET | Freq: Four times a day (QID) | ORAL | Status: DC | PRN

## 2019-10-29 MED ORDER — INSULIN GLARGINE 100 UNIT/ML ~~LOC~~ SOLN
48.0000 [IU] | Freq: Every day | SUBCUTANEOUS | Status: DC
  Administered 2019-10-30: 48 [IU] via SUBCUTANEOUS
  Filled 2019-10-29 (×2): qty 0.48

## 2019-10-29 MED ORDER — INSULIN ASPART 100 UNIT/ML ~~LOC~~ SOLN: 0.0000 [IU] | SUBCUTANEOUS | Status: DC

## 2019-10-29 MED ORDER — ONDANSETRON HCL 4 MG/2ML IJ SOLN
4.0000 mg | Freq: Four times a day (QID) | INTRAMUSCULAR | Status: DC | PRN
  Administered 2019-11-02: 4 mg via INTRAVENOUS
  Filled 2019-10-29: qty 2

## 2019-10-29 MED ORDER — ASPIRIN 81 MG PO CHEW
81.0000 mg | CHEWABLE_TABLET | Freq: Every day | ORAL | Status: DC
  Administered 2019-10-30 – 2019-11-04 (×5): 81 mg via ORAL
  Filled 2019-10-29 (×6): qty 1

## 2019-10-29 NOTE — ED Provider Notes (Signed)
[place] EMERGENCY DEPARTMENT Provider Note   CSN: [id number] Arrival date & time: 10/29/19  1247     History   Chief Complaint Chief Complaint  Patient presents with  . Altered Mental Status    HPI NEISHA HINGER is a 84 y.o. female.     HPI  84 year old female presents with transient altered mental status.  She was in the car with her grandson going to get Covid tested for an upcoming surgery.  She started slurring her words and mumbling and not making sense.  He went back to the house and gave her Snickers and coke.  This is very similar to multiple prior hypoglycemia episodes.  Did not check her sugar and was not sure how much of the food he got into her.  EMS was called.  She seemed to be staring off.  At some point she has improved.  She does not member what happened but now feels totally normal.  Has had many prior hypoglycemia episodes similar to this per grandson, often does not eat well but still takes her insulin. No seizure like activity.  Past Medical History:  Diagnosis Date  . Acute upper respiratory infections of unspecified site   . Anemia   . Anemia, unspecified   . Atherosclerosis of native arteries of the extremities, unspecified   . Chest pain, unspecified   . Chronic kidney disease (CKD), stage II (mild)   . Diabetes mellitus   . Diarrhea   . Disorder of bone and cartilage, unspecified   . DM (diabetes mellitus) type II controlled with renal manifestation ([place])   . Herpes zoster with other nervous system complications(053.19)   . Hypercalcemia   . Hypertension   . Hypertensive renal disease, benign   . Nonspecific reaction to tuberculin skin test without active tuberculosis(795.51)   . Nonspecific tuberculin test reaction   . Other and unspecified hyperlipidemia   . Pain in joint, lower leg   . Peripheral arterial disease ([place])   . Postherpetic neuralgia   . Proteinuria   . Stroke ([place]) 01/2017  . Type II or unspecified type  diabetes mellitus with renal manifestations, not stated as uncontrolled(250.40)   . Type II or unspecified type diabetes mellitus with renal manifestations, uncontrolled(250.42)   . Unspecified disorder of kidney and ureter   . Unspecified essential hypertension     Patient Active Problem List   Diagnosis Date Noted  . Stable proliferative diabetic retinopathy of both eyes associated with type 2 diabetes mellitus ([place]) 06/05/2018  . Acute CVA (cerebrovascular accident) ([place]) 02/18/2017  . Acute ischemic stroke ([place])   . Internuclear ophthalmoplegia of left eye   . Benign paroxysmal positional vertigo   . Abnormality of gait   . Acute onset of severe vertigo 02/17/2017  . Vertigo 02/17/2017  . Atherosclerosis of native arteries of the extremities with intermittent claudication 03/11/2014  . Diabetic retinopathy ([place]) 03/11/2014  . Retinal hemorrhage due to secondary diabetes ([place]) 03/11/2014  . Type II diabetes mellitus with renal manifestations, uncontrolled ([place]) 03/11/2014  . Chronic hepatitis C without hepatic coma ([place]) 03/11/2014  . Hyperlipidemia 03/11/2014  . Hypoglycemia 04/16/2013  . Disorder of bone and cartilage, unspecified   . Other and unspecified hyperlipidemia   . Essential hypertension, benign   . Atherosclerosis of native arteries of the extremities, unspecified   . Chronic kidney disease (CKD), stage II (mild)   . Peripheral arterial disease ([place])   . Anemia   . Postherpetic neuralgia   .  Hypertension     Past Surgical History:  Procedure Laterality Date  . hysterectomy    . INCISION AND DRAINAGE Left 05/27/14   sebacous cyst, ear  . PRP Left    Dr. Ricki Miller  . removal of cyst from hand    . removal of tumor from foot    . TONSILLECTOMY       OB History   No obstetric history on file.      Home Medications    Prior to Admission medications   Medication Sig Start Date End Date Taking? Authorizing Provider  amLODipine (NORVASC) 10 MG tablet  TAKE 1 TABLET (10 MG TOTAL) BY MOUTH DAILY. FOR HIGH BLOOD PRESSURE 06/13/19   Reed, Tiffany L, DO  atorvastatin (LIPITOR) 20 MG tablet TAKE 1 TABLET BY MOUTH EVERY DAY Patient taking differently: Take 20 mg by mouth daily.  06/11/19   Despina Hick, MD  carvedilol (COREG) 25 MG tablet TAKE 1 TABLET BY MOUTH TWICE A DAY WITH A MEAL Patient taking differently: Take 25 mg by mouth 2 (two) times daily with a meal.  12/26/18   Reed, Tiffany L, DO  clopidogrel (PLAVIX) 75 MG tablet TAKE 1 TABLET BY MOUTH EVERY DAY 10/25/19   Reed, Tiffany L, DO  doxycycline (VIBRA-TABS) 100 MG tablet Take 100 mg by mouth 2 (two) times daily.    [provider]  HYDROcodone-acetaminophen (NORCO) 5-325 MG tablet Take 1 tablet by mouth every 6 (six) hours as needed for severe pain. 10/24/19   Reed, Tiffany L, DO  Insulin Glargine (LANTUS) 100 UNIT/ML Solostar Pen INJECT 48 UNITS UNDER THE SKIN DAILY FOR DIABETES Patient taking differently: Inject 48 Units into the skin daily.  10/09/19   Ngetich, [name] C, NP  insulin lispro (HUMALOG KWIKPEN) 100 UNIT/ML KwikPen Inject 0.03 mLs (3 Units total) into the skin 3 (three) times daily. 10/18/19   Reed, Tiffany L, DO  Insulin Pen Needle (B-D ULTRAFINE III SHORT PEN) 31G X 8 MM MISC Use to check blood sugar every day. Dx: 11.29; 11.65 07/02/19   Reed, Tiffany L, DO  ONETOUCH VERIO test strip USE TO TEST BLOOD SUGAR THREE TIMES DAILY. DX: E11.9 10/08/19   Reed, Tiffany L, DO  potassium chloride SA (KLOR-CON) 20 MEQ tablet Take 1 tablet (20 mEq total) by mouth 2 (two) times daily for 3 days. 10/11/19 10/22/19  Sherwood Gambler, MD  valsartan-hydrochlorothiazide (DIOVAN-HCT) 320-25 MG tablet TAKE 1 TABLET BY MOUTH EVERY DAY 10/25/19   [name], DO    Family History Family History  Problem Relation Age of Onset  . Diabetes Mother   . Diabetes Father   . Diabetes Sister   . Diabetes Sister     Social History Social History   Tobacco Use  . Smoking status: Former  Smoker    Types: Cigarettes  . Smokeless tobacco: Never Used  . Tobacco comment: Quit about age 12   Substance Use Topics  . Alcohol use: No    Alcohol/week: 0.0 standard drinks  . Drug use: No     Allergies   Invokana [canagliflozin] and Jardiance [empagliflozin]   Review of Systems Review of Systems  Constitutional: Negative for fever.  Respiratory: Negative for shortness of breath.   Cardiovascular: Negative for chest pain.  Gastrointestinal: Negative for abdominal pain and vomiting.  Neurological: Negative for weakness, numbness and headaches.  Psychiatric/Behavioral: Positive for confusion.  All other systems reviewed and are negative.    Physical Exam Updated Vital Signs BP (!) 151/64  Pulse 81   Temp 98.4 F (36.9 C) (Oral)   Resp 18   SpO2 99%   Physical Exam Vitals signs and nursing note reviewed.  Constitutional:      Appearance: She is well-developed.  HENT:     Head: Normocephalic and atraumatic.     Right Ear: External ear normal.     Left Ear: External ear normal.     Nose: Nose normal.  Eyes:     General:        Right eye: No discharge.        Left eye: No discharge.     Extraocular Movements: Extraocular movements intact.     Pupils: Pupils are equal, round, and reactive to light.  Cardiovascular:     Rate and Rhythm: Normal rate and regular rhythm.     Pulses:          Posterior tibial pulses are detected w/ Doppler on the right side.     Heart sounds: Normal heart sounds.  Pulmonary:     Effort: Pulmonary effort is normal.     Breath sounds: Normal breath sounds.  Abdominal:     Palpations: Abdomen is soft.     Tenderness: There is no abdominal tenderness.  Musculoskeletal:     Comments: Right Foot: See picture. Diffuse tenderness. Purulent drainage with foul smell coming from webspace between 4th and 5th digit  Skin:    General: Skin is warm and dry.  Neurological:     Mental Status: She is alert and oriented to person, place, and  time.     Comments: CN 3-12 grossly intact. 5/5 strength in all 4 extremities.   Psychiatric:        Mood and Affect: Mood is not anxious.        ED Treatments / Results  Labs (all labs ordered are listed, but only abnormal results are displayed) Labs Reviewed  COMPREHENSIVE METABOLIC PANEL - Abnormal; Notable for the following components:      Result Value   Sodium 125 (*)    Chloride 92 (*)    CO2 19 (*)    Glucose, Bld 328 (*)    BUN 31 (*)    Creatinine, Ser 1.68 (*)    Total Protein 6.3 (*)    Albumin 1.9 (*)    GFR calc non Af Amer 28 (*)    GFR calc Af Amer 32 (*)    All other components within normal limits  CBC - Abnormal; Notable for the following components:   WBC 25.6 (*)    RBC 2.51 (*)    Hemoglobin 7.5 (*)    HCT 23.6 (*)    Platelets 454 (*)    All other components within normal limits  CBG MONITORING, ED - Abnormal; Notable for the following components:   Glucose-Capillary 321 (*)    All other components within normal limits  CBG MONITORING, ED - Abnormal; Notable for the following components:   Glucose-Capillary 325 (*)    All other components within normal limits  URINE CULTURE  CULTURE, BLOOD (ROUTINE X 2)  CULTURE, BLOOD (ROUTINE X 2)  SARS CORONAVIRUS 2 (TAT 6-24 HRS)  URINALYSIS, ROUTINE W REFLEX MICROSCOPIC  LACTIC ACID, PLASMA  LACTIC ACID, PLASMA  CBG MONITORING, ED  CBG MONITORING, ED    EKG EKG Interpretation  Date/Time:  Monday October 29 2019 15:14:21 EST Ventricular Rate:  78 PR Interval:    QRS Duration: 70 QT Interval:  386 QTC Calculation: 440 R Axis:  30 Text Interpretation: Sinus rhythm Borderline abnrm T, anterolateral leads Confirmed by Lennice Sites [phone]) on 10/29/2019 4:45:40 PM   Radiology Ct Head Wo Contrast  Result Date: 10/29/2019 CLINICAL DATA:  Altered mental status EXAM: CT HEAD WITHOUT CONTRAST TECHNIQUE: Contiguous axial images were obtained from the base of the skull through the vertex without  intravenous contrast. COMPARISON:  02/17/2017 FINDINGS: Brain: No evidence of acute infarction, hemorrhage, hydrocephalus, extra-axial collection or mass lesion/mass effect. Scattered low-density changes within the periventricular and subcortical white matter compatible with chronic microvascular ischemic change. Mild diffuse cerebral volume loss. Vascular: Mild atherosclerotic calcifications involving the large vessels of the skull base. No unexpected hyperdense vessel. Skull: Normal. Negative for fracture or focal lesion. Sinuses/Orbits: Chronic partial opacification involving a few right ethmoid air cells. Remaining paranasal sinuses and mastoid air cells are clear. Orbital structures within normal limits. Other: None. IMPRESSION: 1.  No acute intracranial findings. 2.  Chronic microvascular ischemic change and cerebral volume loss. Electronically Signed   By: Davina Poke M.D.   On: 10/29/2019 15:03    Procedures Procedures (including critical care time)  Medications Ordered in ED Medications  sodium chloride flush (NS) 0.9 % injection 3 mL (has no administration in time range)  sodium chloride 0.9 % bolus 1,000 mL (has no administration in time range)  cefTRIAXone (ROCEPHIN) 2 g in sodium chloride 0.9 % 100 mL IVPB (has no administration in time range)  vancomycin (VANCOCIN) IVPB 1000 mg/200 mL premix (has no administration in time range)     Initial Impression / Assessment and Plan / ED Course  I have reviewed the triage vital signs and the nursing notes.  Pertinent labs & imaging results that were available during my care of the patient were reviewed by me and considered in my medical decision making (see chart for details).        Patient originally presented for what sounds like a hypoglycemic episode.  However, her lab work is concerning due to worsening renal function and is significantly elevated white count.  I took off her foot bandage and her foot is concerning for  gangrene.  There is purulent drainage with foul smell.  She was given broad IV antibiotics.  I discussed with Dr. Oneida Alar who recommends I called Dr. Fletcher Anon given he was about to do an angiogram in 2 days.  Dr. Fletcher Anon will continue to do this in 2 days and will get cardiology to consult.  Foot x-rays currently pending, and then she will need admission for IV antibiotics.  I updated the grandson. Care to Dr. Ronnald Nian.  Final Clinical Impressions(s) / ED Diagnoses   Final diagnoses:  Gangrene of right foot [name])    ED Discharge Orders    None       Sherwood Gambler, MD 10/29/19 1658

## 2019-10-29 NOTE — ED Triage Notes (Signed)
Pt here via [facility] - was on here way to get covid swabbed for pre-op, had an episode of 'altered mental status" -- on EMS arrival, pt was alert, oriented x4 -- pt is somewhat confused as to incident today-  Has an infection in left foot that is scheduled for surgery this week.

## 2019-10-29 NOTE — ED Notes (Signed)
Pt is a hard stick & IV consult has been put in, EDP is aware.

## 2019-10-29 NOTE — ED Notes (Signed)
ED TO INPATIENT HANDOFF REPORT  ED Nurse Name and Phone #: Kathie Rhodes RN [phone]  S Name/Age/Gender Monique Cabrera 84 y.o. female Room/Bed: 040C/040C  Code Status   Code Status: Prior  Home/SNF/Other Home Patient oriented to: self and place Is this baseline? No   Triage Complete: Triage complete  Chief Complaint rt sided foot pain  Triage Note Pt here via [facility] - was on here way to get covid swabbed for pre-op, had an episode of 'altered mental status" -- on EMS arrival, pt was alert, oriented x4 -- pt is somewhat confused as to incident today-  Has an infection in left foot that is scheduled for surgery this week.    Allergies Allergies  Allergen Reactions  . Invokana [Canagliflozin] Other (See Comments)    Vagina itching and swelling Vaginal Itching and irritation   . Jardiance [Empagliflozin] Itching and Other (See Comments)    Vaginal itching and swelling    Level of Care/Admitting Diagnosis ED Disposition    ED Disposition Condition Comment   Admit  Hospital Area: [facility] [[id number]]  Level of Care: Telemetry Medical [104]  Covid Evaluation: Asymptomatic Screening Protocol (No Symptoms)  Diagnosis: Osteomyelitis ([place]) [062376]  Admitting Physician: Toy Baker [[id number]]  Attending Physician: Toy Baker [[id number]]  Estimated length of stay: 3 - 4 days  Certification:: I certify this patient will need inpatient services for at least 2 midnights  PT Class (Do Not Modify): Inpatient [101]  PT Acc Code (Do Not Modify): Private [1]       B Medical/Surgery History Past Medical History:  Diagnosis Date  . Acute upper respiratory infections of unspecified site   . Anemia   . Anemia, unspecified   . Atherosclerosis of native arteries of the extremities, unspecified   . Chest pain, unspecified   . Chronic kidney disease (CKD), stage II (mild)   . Diabetes mellitus   . Diarrhea   . Disorder of bone and cartilage, unspecified   .  DM (diabetes mellitus) type II controlled with renal manifestation ([place])   . Herpes zoster with other nervous system complications(053.19)   . Hypercalcemia   . Hypertension   . Hypertensive renal disease, benign   . Nonspecific reaction to tuberculin skin test without active tuberculosis(795.51)   . Nonspecific tuberculin test reaction   . Other and unspecified hyperlipidemia   . Pain in joint, lower leg   . Peripheral arterial disease ([place])   . Postherpetic neuralgia   . Proteinuria   . Stroke ([place]) 01/2017  . Type II or unspecified type diabetes mellitus with renal manifestations, not stated as uncontrolled(250.40)   . Type II or unspecified type diabetes mellitus with renal manifestations, uncontrolled(250.42)   . Unspecified disorder of kidney and ureter   . Unspecified essential hypertension    Past Surgical History:  Procedure Laterality Date  . hysterectomy    . INCISION AND DRAINAGE Left 05/27/14   sebacous cyst, ear  . PRP Left    Dr. Ricki Miller  . removal of cyst from hand    . removal of tumor from foot    . TONSILLECTOMY       A IV Location/Drains/Wounds Patient Lines/Drains/Airways Status   Active Line/Drains/Airways    Name:   Placement date:   Placement time:   Site:   Days:   Peripheral IV 10/29/19 Left Antecubital   10/29/19    1800    Antecubital   less than 1  Intake/Output Last 24 hours No intake or output data in the 24 hours ending 10/29/19 2036  Labs/Imaging Results for orders placed or performed during the hospital encounter of 10/29/19 (from the past 48 hour(s))  Comprehensive metabolic panel     Status: Abnormal   Collection Time: 10/29/19  1:09 PM  Result Value Ref Range   Sodium 125 (L) 135 - 145 mmol/L   Potassium 4.2 3.5 - 5.1 mmol/L   Chloride 92 (L) 98 - 111 mmol/L   CO2 19 (L) 22 - 32 mmol/L   Glucose, Bld 328 (H) 70 - 99 mg/dL   BUN 31 (H) 8 - 23 mg/dL   Creatinine, Ser 1.68 (H) 0.44 - 1.00 mg/dL   Calcium 10.2 8.9 -  10.3 mg/dL   Total Protein 6.3 (L) 6.5 - 8.1 g/dL   Albumin 1.9 (L) 3.5 - 5.0 g/dL   AST 26 15 - 41 U/L   ALT 19 0 - 44 U/L   Alkaline Phosphatase 68 38 - 126 U/L   Total Bilirubin 1.1 0.3 - 1.2 mg/dL   GFR calc non Af Amer 28 (L) >60 mL/min   GFR calc Af Amer 32 (L) >60 mL/min   Anion gap 14 5 - 15    Comment: Performed at [facility], [address] [place], [place] [address]  CBC     Status: Abnormal   Collection Time: 10/29/19  1:09 PM  Result Value Ref Range   WBC 25.6 (H) 4.0 - 10.5 K/uL   RBC 2.51 (L) 3.87 - 5.11 MIL/uL   Hemoglobin 7.5 (L) 12.0 - 15.0 g/dL   HCT 23.6 (L) 36.0 - 46.0 %   MCV 94.0 80.0 - 100.0 fL   MCH 29.9 26.0 - 34.0 pg   MCHC 31.8 30.0 - 36.0 g/dL   RDW 13.5 11.5 - 15.5 %   Platelets 454 (H) 150 - 400 K/uL   nRBC 0.0 0.0 - 0.2 %    Comment: Performed at [facility], [place] [address] [place], [place] [address]  CBG monitoring, ED     Status: Abnormal   Collection Time: 10/29/19  1:33 PM  Result Value Ref Range   Glucose-Capillary 321 (H) 70 - 99 mg/dL  CBG monitoring, ED (now and then every hour for 3 hours)     Status: Abnormal   Collection Time: 10/29/19  3:18 PM  Result Value Ref Range   Glucose-Capillary 325 (H) 70 - 99 mg/dL  Lactic acid, plasma     Status: None   Collection Time: 10/29/19  5:15 PM  Result Value Ref Range   Lactic Acid, Venous 1.4 0.5 - 1.9 mmol/L    Comment: Performed at [facility] [address] [place], [place] [address]  CBG monitoring, ED (now and then every hour for 3 hours)     Status: Abnormal   Collection Time: 10/29/19  5:48 PM  Result Value Ref Range   Glucose-Capillary 321 (H) 70 - 99 mg/dL  Reticulocytes     Status: Abnormal   Collection Time: 10/29/19  8:02 PM  Result Value Ref Range   Retic Ct Pct 1.3 0.4 - 3.1 %   RBC. 2.17 (L) 3.87 - 5.11 MIL/uL   Retic Count, Absolute 27.1 19.0 - 186.0 K/uL   Immature Retic Fract 20.0 (H) 2.3 - 15.9 %    Comment: Performed at [facility] [address], [place], [place] [address]   Ct Head Wo Contrast  Result Date: 10/29/2019 CLINICAL DATA:  Altered mental status EXAM: CT HEAD WITHOUT CONTRAST TECHNIQUE: Contiguous axial images were obtained from the base of the skull through the vertex without intravenous contrast. COMPARISON:  02/17/2017 FINDINGS: Brain: No evidence of acute infarction, hemorrhage, hydrocephalus, extra-axial collection or mass lesion/mass effect. Scattered low-density changes within the periventricular and subcortical white matter compatible with chronic microvascular ischemic change. Mild diffuse cerebral volume loss. Vascular: Mild atherosclerotic calcifications involving the large vessels of the skull base. No unexpected hyperdense vessel. Skull: Normal. Negative for fracture or focal lesion. Sinuses/Orbits: Chronic partial opacification involving a few right ethmoid air cells. Remaining paranasal sinuses and mastoid air cells are clear. Orbital structures within normal limits. Other: None. IMPRESSION: 1.  No acute intracranial findings. 2.  Chronic microvascular ischemic change and cerebral volume loss. Electronically Signed   By: Davina Poke M.D.   On: 10/29/2019 15:03   Dg Foot Complete Right  Result Date: 10/29/2019 CLINICAL DATA:  Pt c/o right foot infection and diabetes wounds for an unknown period of time. Hx of DM. Patient is scheduled for surgical removal of infection on the right foot. EXAM: RIGHT FOOT COMPLETE - 3+ VIEW COMPARISON:  10/04/2019 FINDINGS: Soft tissue swelling with soft tissue air is noted along the dorsal lateral aspect of the foot, from just distal to the ankle joint to the lateral metatarsophalangeal joints. There is a subtle area apparent loss of the white cortical line along the lateral plantar margin of the cuboid which could reflect osteomyelitis. There is evidence of resorption along the medial base of the proximal phalanx of the fifth toe which could reflect osteomyelitis. No other  evidence of osteomyelitis. No fractures. Joints are normally aligned. Stable first metatarsophalangeal joint osteoarthritis. Small plantar and dorsal calcaneal spurs. There are dense arterial vascular calcifications from the ankle to the foot. IMPRESSION: 1. Right foot infection with soft tissue swelling and soft tissue air along the dorsal lateral aspect of the foot as described. 2. Possible small focus of osteomyelitis along the plantar lateral aspect of the cuboid with another possible area of osteomyelitis along medial base of the proximal phalanx of the fifth toe. No other evidence of osteomyelitis. Electronically Signed   By: Lajean Manes M.D.   On: 10/29/2019 16:56    Pending Labs Unresulted Labs (From admission, onward)    Start     Ordered   10/29/19 2001  MRSA PCR Screening  Once,   STAT    Question:  Patient immune status  Answer:  Normal   10/29/19 2001   10/29/19 1917  Sedimentation rate  Add-on,   AD     10/29/19 1916   10/29/19 1917  C-reactive protein  Add-on,   AD     10/29/19 1916   10/29/19 1917  Prealbumin  Once,   STAT     10/29/19 1916   10/29/19 1916  Vitamin B12  (Anemia Panel ([facility]))  Once,   STAT     10/29/19 1915   10/29/19 1916  Folate  (Anemia Panel ([facility]))  Once,   STAT     10/29/19 1915   10/29/19 1916  Iron and TIBC  (Anemia Panel ([facility]))  Once,   STAT     10/29/19 1915   10/29/19 1916  Ferritin  (Anemia Panel ([facility]))  Once,   STAT     10/29/19 1915   10/29/19 1558  Culture, blood (routine x 2)  BLOOD CULTURE X 2,   STAT     10/29/19 1558  10/29/19 1558  SARS CORONAVIRUS 2 (TAT 6-24 HRS) Nasopharyngeal Nasopharyngeal Swab  (Asymptomatic/Tier 2 Patients Labs)  Once,   STAT    Question Answer Comment  Is this test for diagnosis or screening Screening   Symptomatic for COVID-19 as defined by CDC No   Hospitalized for COVID-19 No   Admitted to ICU for COVID-19 No   Previously tested for COVID-19 No   Resident in a congregate (group) care setting No    Employed in healthcare setting No   Pregnant No      10/29/19 1558   10/29/19 1548  Urinalysis, Routine w reflex microscopic  ONCE - STAT,   STAT     10/29/19 1547   10/29/19 1548  Urine culture  ONCE - STAT,   STAT     10/29/19 1547   Unscheduled  Occult blood card to lab, stool RN will collect  As needed,   R    Question:  Specimen to be collected by:  Answer:  RN will collect   10/29/19 1916   Signed and Held  Hemoglobin A1c  Tomorrow morning,   R    Comments: To assess prior glycemic control    Signed and Held   Signed and Held  Lactic acid, plasma  STAT Now then every 3 hours,   STAT     Signed and Held   Signed and Held  Protime-INR  ONCE - STAT,   R     Signed and Held   Signed and Held  APTT  ONCE - STAT,   R     Signed and Held          Vitals/Pain Today's Vitals   10/29/19 1830 10/29/19 1930 10/29/19 2000 10/29/19 2030  BP: (!) 147/57 (!) 145/65 (!) 146/67 (!) 145/68  Pulse: 84     Resp: 19 (!) 26 17 18   Temp:      TempSrc:      SpO2: 98%     PainSc:        Isolation Precautions No active isolations  Medications Medications  vancomycin (VANCOCIN) IVPB 1000 mg/200 mL premix (1,000 mg Intravenous Bolus from Bag 10/29/19 1845)  aspirin chewable tablet 81 mg (has no administration in time range)  sodium chloride flush (NS) 0.9 % injection 3 mL (3 mLs Intravenous Given 10/29/19 1802)  sodium chloride 0.9 % bolus 1,000 mL (1,000 mLs Intravenous Bolus from Bag 10/29/19 1800)  cefTRIAXone (ROCEPHIN) 2 g in sodium chloride 0.9 % 100 mL IVPB (0 g Intravenous Stopped 10/29/19 1845)    Mobility Unknown Low fall risk   Focused Assessments Cardiac Assessment Handoff:  Cardiac Rhythm: Normal sinus rhythm Lab Results  Component Value Date   TROPONINI <0.03 02/17/2017   No results found for: DDIMER Does the Patient currently have chest pain? No     R Recommendations: See Admitting Provider Note  Report given to:   Additional Notes: Pt still appears to be  altered, but is able to follow directions and cooperate.

## 2019-10-29 NOTE — H&P (Signed)
Monique Cabrera ZOX:[id number] DOB: 1935/08/11 DOA: 10/29/2019     PCP: [name], DO   Outpatient Specialists:  CARDS: Dr. Fletcher Anon    Patient arrived to ER on 10/29/19 at 1247  Patient coming from: home Lives with Son       Chief Complaint:   Chief Complaint  Patient presents with  . Altered Mental Status    HPI: Monique Cabrera is a 84 y.o. female with medical history significant of HTN, HLD, PAD, DM type 2, CVA on plavix, CKD stage 3 chronic anemia    Presented with episodes of slight confusion no localizing neurological events Hx of poorly healed right foot ulcer ABI done but was not accurate due to non-compressible vessels. Toe pressure was absent. Duplex showed occluded right SFA with one-vessel runoff below the knee via the anterior tibial artery and occluded left SFA and popliteal arteries with possible occlusion of lef common iliac artery. Plan for cardiology list undergone vascular study on 4 November Was undergoing Covid testing today prior to procedure and had a brief episode of slightly slurred speech and mumbling slight felt grandson thought was typical of her hypoglycemia episodes he took her home give a Snickers and coke symptoms did not resolve rapidly so he called EMS She has been feeling somewhat confused throughout the day and she states it usually happens when her blood sugars are low  Infectious risk factors:  Reports confusion In  ER RAPID COVID TEST  in house testing  Pending  No results found for: SARSCOV2NAA   Regarding pertinent Chronic problems:     Hyperlipidemia - on statins Lipitor   HTN on Norvasc, coreg diovan   CHF diastolic  - last echo 81/19/1478  ejection fraction was in the range of 60% to 65%. (grade 1   diastolic dysfunction).       DM 2 -  Lab Results  Component Value Date   HGBA1C 9.5 (H) 06/27/2019   on insulin,      Hx of CVA -  With out residual deficits on , Plavix     CKD stage III - baseline Cr 1.1 Lab  Results  Component Value Date   CREATININE 1.68 (H) 10/29/2019   CREATININE 1.12 (H) 10/22/2019   CREATININE 1.56 (H) 10/11/2019      While in ER: CT head was unremarkable She was noted to have nonhealing lower extremity ulceration Altered mental status felt to be secondary to cellulitis/osteomyelitis of her foot Initially vascular surgery was consulted but given neck cardiology has been aggressively involved in her care cardiology has been consulted instead. She was seen in consult by Dr. Sallyanne Kuster who recommended broad-spectrum antibiotics plan for abdominal aortogram with LE runoff and possible endovascular intervention on 4 November Okay with continuing Plavix and statin Initiate aspirin. Patient had blood cultures drawn started on vancomycin and ceftriaxone Right foot with imaging showed small focus of osteomyelitis The following Work up has been ordered so far:  Orders Placed This Encounter  Procedures  . Urine culture  . Culture, blood (routine x 2)  . SARS CORONAVIRUS 2 (TAT 6-24 HRS) Nasopharyngeal Nasopharyngeal Swab  . CT Head Wo Contrast  . DG Foot Complete Right  . Comprehensive metabolic panel  . CBC  . Urinalysis, Routine w reflex microscopic  . Diet NPO time specified  . Diet NPO time specified Except for: Sips with Meds  . Cardiac monitoring  . Saline Lock IV, Maintain IV access  . Neuro checks q 2  hours x12 hours  . Cardiac monitoring  . Offer Juice and Food  . vancomycin per pharmacy consult  . Consult to vascular surgery  ALL PATIENTS BEING ADMITTED/HAVING PROCEDURES NEED COVID-19 SCREENING  . Consult to hospitalist  ALL PATIENTS BEING ADMITTED/HAVING PROCEDURES NEED COVID-19 SCREENING  . Pulse oximetry, continuous  . CBG monitoring, ED  . CBG monitoring, ED (now and then every hour for 3 hours)  . ED EKG  . EKG 12-Lead     Following Medications were ordered in ER: Medications  vancomycin (VANCOCIN) IVPB 1000 mg/200 mL premix (1,000 mg Intravenous  Bolus from Bag 10/29/19 1845)  aspirin chewable tablet 81 mg (has no administration in time range)  sodium chloride flush (NS) 0.9 % injection 3 mL (3 mLs Intravenous Given 10/29/19 1802)  sodium chloride 0.9 % bolus 1,000 mL (1,000 mLs Intravenous Bolus from Bag 10/29/19 1800)  cefTRIAXone (ROCEPHIN) 2 g in sodium chloride 0.9 % 100 mL IVPB (0 g Intravenous Stopped 10/29/19 1845)        Consult Orders  (From admission, onward)         Start     Ordered   10/29/19 1800  Consult to hospitalist  ALL PATIENTS BEING ADMITTED/HAVING PROCEDURES NEED COVID-19 SCREENING  Once    Comments: ALL PATIENTS BEING ADMITTED/HAVING PROCEDURES NEED COVID-19 SCREENING  Provider:  (Not yet assigned)  Question Answer Comment  Place call to: [facility]   Reason for Consult Admit      10/29/19 1759          ER Provider Called: Cardiology   Dr.Croitoru They Recommend admit to medicine    seen in ER   Significant initial  Findings: Abnormal Labs Reviewed  COMPREHENSIVE METABOLIC PANEL - Abnormal; Notable for the following components:      Result Value   Sodium 125 (*)    Chloride 92 (*)    CO2 19 (*)    Glucose, Bld 328 (*)    BUN 31 (*)    Creatinine, Ser 1.68 (*)    Total Protein 6.3 (*)    Albumin 1.9 (*)    GFR calc non Af Amer 28 (*)    GFR calc Af Amer 32 (*)    All other components within normal limits  CBC - Abnormal; Notable for the following components:   WBC 25.6 (*)    RBC 2.51 (*)    Hemoglobin 7.5 (*)    HCT 23.6 (*)    Platelets 454 (*)    All other components within normal limits  CBG MONITORING, ED - Abnormal; Notable for the following components:   Glucose-Capillary 321 (*)    All other components within normal limits  CBG MONITORING, ED - Abnormal; Notable for the following components:   Glucose-Capillary 325 (*)    All other components within normal limits  CBG MONITORING, ED - Abnormal; Notable for the following components:   Glucose-Capillary 321 (*)     All other components within normal limits    Otherwise labs showing:    Recent Labs  Lab 10/29/19 1309  NA 125*  K 4.2  CO2 19*  GLUCOSE 328*  BUN 31*  CREATININE 1.68*  CALCIUM 10.2    Cr   Up from baseline see below Lab Results  Component Value Date   CREATININE 1.68 (H) 10/29/2019   CREATININE 1.12 (H) 10/22/2019   CREATININE 1.56 (H) 10/11/2019    Recent Labs  Lab 10/29/19 1309  AST 26  ALT 19  ALKPHOS  68  BILITOT 1.1  PROT 6.3*  ALBUMIN 1.9*   Lab Results  Component Value Date   CALCIUM 10.2 10/29/2019     WBC      Component Value Date/Time   WBC 25.6 (H) 10/29/2019 1309   ANC    Component Value Date/Time   NEUTROABS 15.9 (H) 10/22/2019 1056   ALC No components found for: LYMPHAB    Plt: Lab Results  Component Value Date   PLT 454 (H) 10/29/2019    Lactic Acid, Venous    Component Value Date/Time   LATICACIDVEN 1.4 10/29/2019 1715    Procalcitonin   Ordered   COVID-19 Labs  No results for input(s): DDIMER, FERRITIN, LDH, CRP in the last 72 hours.  No results found for: SARSCOV2NAA    HG/HCT   Down  from baseline see below    Component Value Date/Time   HGB 7.5 (L) 10/29/2019 1309   HGB 8.6 (L) 10/22/2019 1056   HCT 23.6 (L) 10/29/2019 1309   HCT 26.7 (L) 10/22/2019 1056    No results for input(s): LIPASE, AMYLASE in the last 168 hours. No results for input(s): AMMONIA in the last 168 hours.  No components found for: LABALBU     ECG: Ordered Personally reviewed by me showing: HR : 78 Rhythm: NSR    no evidence of ischemic changes QTC 440  DM  labs:  HbA1C: Recent Labs    02/07/19 0900 06/27/19 0835  HGBA1C 8.9* 9.5*       CBG (last 3)  Recent Labs    10/29/19 1333 10/29/19 1518 10/29/19 1748  GLUCAP 321* 325* 321*       UA  ordered      Ordered  CT HEAD   NON acute  Right foot -  Evidence of Osteo   ED Triage Vitals  Enc Vitals Group     BP 10/29/19 1306 (!) 139/53     Pulse Rate 10/29/19  1306 76     Resp 10/29/19 1306 14     Temp 10/29/19 1306 98.4 F (36.9 C)     Temp Source 10/29/19 1306 Oral     SpO2 10/29/19 1306 100 %     Weight --      Height --      Head Circumference --      Peak Flow --      Pain Score 10/29/19 1823 Asleep     Pain Loc --      Pain Edu? --      Excl. in [place]? --   TMAX(24)@       Latest  Blood pressure (!) 147/57, pulse 84, temperature 98.4 F (36.9 C), temperature source Oral, resp. rate 19, SpO2 98 %.     Hospitalist was called for admission for AMS and Osteomyelitis   Review of Systems:    Pertinent positives include: fatigue, lightheadedness, right foot ulceration  Constitutional:  No weight loss, night sweats, Fevers, chills, fatigue, weight loss  HEENT:  No headaches, Difficulty swallowing,Tooth/dental problems,Sore throat,  No sneezing, itching, ear ache, nasal congestion, post nasal drip,  Cardio-vascular:  No chest pain, Orthopnea, PND, anasarca, dizziness, palpitations.no Bilateral lower extremity swelling  GI:  No heartburn, indigestion, abdominal pain, nausea, vomiting, diarrhea, change in bowel habits, loss of appetite, melena, blood in stool, hematemesis Resp:  no shortness of breath at rest. No dyspnea on exertion, No excess mucus, no productive cough, No non-productive cough, No coughing up of blood.No change in color of mucus.No wheezing.  Skin:  no rash or lesions. No jaundice GU:  no dysuria, change in color of urine, no urgency or frequency. No straining to urinate.  No flank pain.  Musculoskeletal:  No joint pain or no joint swelling. No decreased range of motion. No back pain.  Psych:  No change in mood or affect. No depression or anxiety. No memory loss.  Neuro: no localizing neurological complaints, no tingling, no weakness, no double vision, no gait abnormality, no slurred speech, no confusion  All systems reviewed and apart from [place] all are negative  Past Medical History:   Past Medical History:   Diagnosis Date  . Acute upper respiratory infections of unspecified site   . Anemia   . Anemia, unspecified   . Atherosclerosis of native arteries of the extremities, unspecified   . Chest pain, unspecified   . Chronic kidney disease (CKD), stage II (mild)   . Diabetes mellitus   . Diarrhea   . Disorder of bone and cartilage, unspecified   . DM (diabetes mellitus) type II controlled with renal manifestation ([place])   . Herpes zoster with other nervous system complications(053.19)   . Hypercalcemia   . Hypertension   . Hypertensive renal disease, benign   . Nonspecific reaction to tuberculin skin test without active tuberculosis(795.51)   . Nonspecific tuberculin test reaction   . Other and unspecified hyperlipidemia   . Pain in joint, lower leg   . Peripheral arterial disease ([place])   . Postherpetic neuralgia   . Proteinuria   . Stroke ([place]) 01/2017  . Type II or unspecified type diabetes mellitus with renal manifestations, not stated as uncontrolled(250.40)   . Type II or unspecified type diabetes mellitus with renal manifestations, uncontrolled(250.42)   . Unspecified disorder of kidney and ureter   . Unspecified essential hypertension       Past Surgical History:  Procedure Laterality Date  . hysterectomy    . INCISION AND DRAINAGE Left 05/27/14   sebacous cyst, ear  . PRP Left    Dr. Ricki Miller  . removal of cyst from hand    . removal of tumor from foot    . TONSILLECTOMY      Social History:  Ambulatory   walker        reports that she has quit smoking. Her smoking use included cigarettes. She has never used smokeless tobacco. She reports that she does not drink alcohol or use drugs.    Family History:   Family History  Problem Relation Age of Onset  . Diabetes Mother   . Diabetes Father   . Diabetes Sister   . Diabetes Sister     Allergies: Allergies  Allergen Reactions  . Invokana [Canagliflozin] Other (See Comments)    Vagina itching and  swelling Vaginal Itching and irritation   . Jardiance [Empagliflozin] Itching and Other (See Comments)    Vaginal itching and swelling     Prior to Admission medications   Medication Sig Start Date End Date Taking? Authorizing Provider  amLODipine (NORVASC) 10 MG tablet TAKE 1 TABLET (10 MG TOTAL) BY MOUTH DAILY. FOR HIGH BLOOD PRESSURE 06/13/19   Reed, Tiffany L, DO  atorvastatin (LIPITOR) 20 MG tablet TAKE 1 TABLET BY MOUTH EVERY DAY Patient taking differently: Take 20 mg by mouth daily.  06/11/19   Despina Hick, MD  carvedilol (COREG) 25 MG tablet TAKE 1 TABLET BY MOUTH TWICE A DAY WITH A MEAL Patient taking differently: Take 25 mg by mouth 2 (two) times daily  with a meal.  12/26/18   Reed, Tiffany L, DO  clopidogrel (PLAVIX) 75 MG tablet TAKE 1 TABLET BY MOUTH EVERY DAY 10/25/19   Reed, Tiffany L, DO  doxycycline (VIBRA-TABS) 100 MG tablet Take 100 mg by mouth 2 (two) times daily.    [provider]  HYDROcodone-acetaminophen (NORCO) 5-325 MG tablet Take 1 tablet by mouth every 6 (six) hours as needed for severe pain. 10/24/19   Reed, Tiffany L, DO  Insulin Glargine (LANTUS) 100 UNIT/ML Solostar Pen INJECT 48 UNITS UNDER THE SKIN DAILY FOR DIABETES Patient taking differently: Inject 48 Units into the skin daily.  10/09/19   Ngetich, [name] C, NP  insulin lispro (HUMALOG KWIKPEN) 100 UNIT/ML KwikPen Inject 0.03 mLs (3 Units total) into the skin 3 (three) times daily. 10/18/19   Reed, Tiffany L, DO  Insulin Pen Needle (B-D ULTRAFINE III SHORT PEN) 31G X 8 MM MISC Use to check blood sugar every day. Dx: 11.29; 11.65 07/02/19   Reed, Tiffany L, DO  ONETOUCH VERIO test strip USE TO TEST BLOOD SUGAR THREE TIMES DAILY. DX: E11.9 10/08/19   Reed, Tiffany L, DO  potassium chloride SA (KLOR-CON) 20 MEQ tablet Take 1 tablet (20 mEq total) by mouth 2 (two) times daily for 3 days. 10/11/19 10/22/19  Sherwood Gambler, MD  valsartan-hydrochlorothiazide (DIOVAN-HCT) 320-25 MG tablet TAKE 1 TABLET BY  MOUTH EVERY DAY 10/25/19   [name], DO   Physical Exam: Blood pressure (!) 147/57, pulse 84, temperature 98.4 F (36.9 C), temperature source Oral, resp. rate 19, SpO2 98 %. 1. General:  in No  Acute distress   Chronically ill  -appearing 2. Psychological: Alert and Oriented 3. Head/ENT:    Dry Mucous Membranes                          Head Non traumatic, neck supple                          Poor Dentition 4. SKIN:   decreased Skin turgor,  Skin clean Dry right foot  ulcer 5. Heart: Regular rate and rhythm no  Murmur, no Rub or gallop 6. Lungs:   no wheezes or crackles   7. Abdomen: Soft,  non-tender, Non distended  Obese bowel sounds present 8. Lower extremities: no clubbing, cyanosis, no  edema 9. Neurologically Grossly intact, moving all 4 extremities equally   10. MSK: Normal range of motion   All other LABS:     Recent Labs  Lab 10/29/19 1309  WBC 25.6*  HGB 7.5*  HCT 23.6*  MCV 94.0  PLT 454*     Recent Labs  Lab 10/29/19 1309  NA 125*  K 4.2  CL 92*  CO2 19*  GLUCOSE 328*  BUN 31*  CREATININE 1.68*  CALCIUM 10.2     Recent Labs  Lab 10/29/19 1309  AST 26  ALT 19  ALKPHOS 68  BILITOT 1.1  PROT 6.3*  ALBUMIN 1.9*       Cultures:    Component Value Date/Time   SDES  10/01/2019 1100    WOUND Performed at [name], [place] [address], [place], [place] [address]    SPECREQUEST  10/01/2019 1100    RIGHT 4TH/5TH TOE WEB Performed at [facility], [place] [address] [place], [place] [address]    CULT RARE SERRATIA MARCESCENS RARE PANTOEA SPECIES  10/01/2019 1100   REPTSTATUS  10/06/2019 FINAL 10/01/2019 1100     Radiological Exams on Admission: Ct Head Wo Contrast  Result Date: 10/29/2019 CLINICAL DATA:  Altered mental status EXAM: CT HEAD WITHOUT CONTRAST TECHNIQUE: Contiguous axial images were obtained from the base of the skull through the vertex without intravenous contrast. COMPARISON:   02/17/2017 FINDINGS: Brain: No evidence of acute infarction, hemorrhage, hydrocephalus, extra-axial collection or mass lesion/mass effect. Scattered low-density changes within the periventricular and subcortical white matter compatible with chronic microvascular ischemic change. Mild diffuse cerebral volume loss. Vascular: Mild atherosclerotic calcifications involving the large vessels of the skull base. No unexpected hyperdense vessel. Skull: Normal. Negative for fracture or focal lesion. Sinuses/Orbits: Chronic partial opacification involving a few right ethmoid air cells. Remaining paranasal sinuses and mastoid air cells are clear. Orbital structures within normal limits. Other: None. IMPRESSION: 1.  No acute intracranial findings. 2.  Chronic microvascular ischemic change and cerebral volume loss. Electronically Signed   By: Davina Poke M.D.   On: 10/29/2019 15:03   Dg Foot Complete Right  Result Date: 10/29/2019 CLINICAL DATA:  Pt c/o right foot infection and diabetes wounds for an unknown period of time. Hx of DM. Patient is scheduled for surgical removal of infection on the right foot. EXAM: RIGHT FOOT COMPLETE - 3+ VIEW COMPARISON:  10/04/2019 FINDINGS: Soft tissue swelling with soft tissue air is noted along the dorsal lateral aspect of the foot, from just distal to the ankle joint to the lateral metatarsophalangeal joints. There is a subtle area apparent loss of the white cortical line along the lateral plantar margin of the cuboid which could reflect osteomyelitis. There is evidence of resorption along the medial base of the proximal phalanx of the fifth toe which could reflect osteomyelitis. No other evidence of osteomyelitis. No fractures. Joints are normally aligned. Stable first metatarsophalangeal joint osteoarthritis. Small plantar and dorsal calcaneal spurs. There are dense arterial vascular calcifications from the ankle to the foot. IMPRESSION: 1. Right foot infection with soft tissue  swelling and soft tissue air along the dorsal lateral aspect of the foot as described. 2. Possible small focus of osteomyelitis along the plantar lateral aspect of the cuboid with another possible area of osteomyelitis along medial base of the proximal phalanx of the fifth toe. No other evidence of osteomyelitis. Electronically Signed   By: Lajean Manes M.D.   On: 10/29/2019 16:56    Chart has been reviewed    Assessment/Plan   84 y.o. female with medical history significant of HTN, HLD, PAD, DM type 2, CVA on plavix, CKD stage 3 chronic anemia  Admitted for osteomyelitis  Present on Admission: . Osteomyelitis ([place]) - -admit per ext wound protocol will     Cover with vancomycin /flagyl/ceftriaxone          plain films showed  evidence of air evidence of osteomyelitis   No  foreign   objects      Will obtain MRSA screening,      obtained blood cultures      further antibiotic adjustment pending above results  . Essential hypertension, benign -chronic stable continue home medications  . Peripheral arterial disease (HCC)/Atherosclerosis of native artery of extremity [facility]) -appreciate cardiology input plan to continue Plavix and add aspirin plan for vascular study on Wednesday   . Chronic kidney disease (CKD), stage II (mild) -chronic stable continue to monitor avoid nephrotoxic medications   . Anemia - chronic order anemia panel and Hemoccult stool may need further work-up pending results Patient is already on  Plavix and will add aspirin    . Type II diabetes mellitus with renal manifestations, uncontrolled (HCC) -  - Order Sensitive    SSI   - continue home insulin regimen   -  check TSH and HgA1C  - Hold by mouth medications  hyperglycemia   . Chronic hepatitis C without hepatic coma (HCC) chronic stable  . Hyperlipidemia -chronic stable continue home medications  . Hyponatremia -check urine electrolytes, gently rehydrate  . Chronic diastolic CHF (congestive heart  failure) (HCC) avoid fluid overload, currently slightly on the dry side   Other plan as per orders.  DVT prophylaxis:  SCD    Code Status:  FULL CODE   as per  family  I had personally discussed CODE STATUS with   Family     Family Communication:   Family not at  Bedside  plan of care was discussed on the phone with   Daughter  Disposition Plan:      To home once workup is complete and patient is stable                      Would benefit from PT/OT eval prior to DC  Ordered                                      Nutrition    consulted                  Wound care  consulted                                Consults called: cardiology is aware    Admission status:  ED Disposition    ED Disposition Condition Comment   Admit  The patient appears reasonably stabilized for admission considering the current resources, flow, and capabilities available in the ED at this time, and I doubt any other [facility] requiring further screening and/or treatment in the ED prior to admission is  present.          inpatient     Expect 2 midnight stay secondary to severity of patient's current illness including   hemodynamic instability despite optimal treatment (tachycardia *hypotension * tachypnea *hypoxia, hypercapnia)   Severe lab/radiological/exam abnormalities including:     and extensive comorbidities including:  DM2    CHF  CKD  liver disease  history of stroke with/out residual deficits     That are currently affecting medical management.   I expect  patient to be hospitalized for 2 midnights requiring inpatient medical care.  Patient is at high risk for adverse outcome (such as loss of life or disability) if not treated.  Indication for inpatient stay as follows:  Severe change from baseline regarding mental status   Need for operative/procedural  intervention   Need for IV antibiotics, IV fluids,      Level of care    tele  For 24H    Precautions:   NONE   No active  isolations  PPE: Used by the provider:   P100  eye Goggles,  Gloves    Laquentin Loudermilk 10/29/2019, 9:12 PM    [facility]     after 2 AM please page floor coverage PA If 7AM-7PM, please contact the day team taking care of the patient using [email or web address]

## 2019-10-29 NOTE — ED Notes (Signed)
Patient transported to X-ray 

## 2019-10-29 NOTE — Progress Notes (Signed)
Pharmacy Antibiotic Note  Monique Cabrera is a 84 y.o. female admitted on 10/29/2019 with cellulitis.  Pharmacy has been consulted for Vancomycin dosing.     Temp (24hrs), Avg:98.4 F (36.9 C), Min:98.4 F (36.9 C), Max:98.4 F (36.9 C)  Recent Labs  Lab 10/29/19 1309  WBC 25.6*  CREATININE 1.68*    Estimated Creatinine Clearance: 23.2 mL/min (A) (by C-G formula based on SCr of 1.68 mg/dL (H)).    Allergies  Allergen Reactions  . Invokana [Canagliflozin] Other (See Comments)    Vagina itching and swelling Vaginal Itching and irritation   . Jardiance [Empagliflozin] Itching and Other (See Comments)    Vaginal itching and swelling    Antimicrobials this admission: 11/2 Ceftriaxone >>  11/2 Vancomcyin >>   Microbiology results: 11/2 BCx: pending  Plan: - Dosing vancomycin for cellulitis so no indication for a loading dose  - Will start patient on Vancomycin 1000 mg IV q48h - Calculated AUC est of 472 - Will continue to monitor and follow   Thank you for allowing pharmacy to be a part of this patient's care.  Duanne Limerick PharmD. BCPS  10/29/2019 4:09 PM

## 2019-10-29 NOTE — ED Provider Notes (Signed)
Patient with worsening right foot infection.  To be admitted to hospitalist.  Awaiting x-ray and then will admit.  Persistent leukocytosis despite outpatient antibiotics.  Cardiology is consulted as they are scheduled to do angiogram to evaluate for peripheral artery disease.  Broad-spectrum IV antibiotics have been started.  X-ray shows signs of osteomyelitis.  Admitted to medicine for further care.  Cardiology will continue work-up and angiogram likely Wednesday.  Patient hemodynamically stable throughout my care.   Lennice Sites, DO 10/29/19 1900

## 2019-10-29 NOTE — Consult Note (Addendum)
Cardiology Consultation:   Patient ID: Monique Cabrera; [id number]; 1935-12-24   Admit date: 10/29/2019 Date of Consult: 10/29/2019  Primary Care Provider: [name], DO Primary Cardiologist: Follows with Dr. Fletcher Anon for PAD Primary Electrophysiologist:  None    Patient Profile:   Monique Cabrera is a 84 y.o. female with a PMH of HTN, HLD, PAD, DM type 2, CVA on plavix, CKD stage 2, who is being seen today for the evaluation of PAD at the request of Dr. Regenia Skeeter.  History of Present Illness:   Monique Cabrera was seen outpatient by Dr. Fletcher Anon 10/22/2019 for the evaluation of a poorly healing right foot ulceration with associated skin discoloration. She underwent non-invasive vascular studies which showed non-compressible vessels to calculate ABI. Toe pressure was absent. Duplex showed occluded right SFA with one-vessel runoff below the knee via the anterior tibial artery and occluded left SFA and popliteal arteries with possible occlusion of lef common iliac artery. She was recommended to undergo an abdominal aortogram with LE runoff and possible endovascular intervention. She was planned for this procedure 10/31/2019. Her grandson was taking her for pre-procedure COVID testing today when the patient developed acute slurring of speech, mumbling, and AMS. He took her home and gave her a snickers and coke as she has had similar episodes with hypoglycemia, however symptoms did not improve, prompting EMS activation.    She reports feeling intermittent confusion throughout the day. States this has happened from time to time when blood sugars are low. No significant pain. RLE wound has been poorly healing. She understand she is high risk for losing digits. She denies fevers.    Past Medical History:  Diagnosis Date  . Acute upper respiratory infections of unspecified site   . Anemia   . Anemia, unspecified   . Atherosclerosis of native arteries of the extremities, unspecified   . Chest pain,  unspecified   . Chronic kidney disease (CKD), stage II (mild)   . Diabetes mellitus   . Diarrhea   . Disorder of bone and cartilage, unspecified   . DM (diabetes mellitus) type II controlled with renal manifestation ([place])   . Herpes zoster with other nervous system complications(053.19)   . Hypercalcemia   . Hypertension   . Hypertensive renal disease, benign   . Nonspecific reaction to tuberculin skin test without active tuberculosis(795.51)   . Nonspecific tuberculin test reaction   . Other and unspecified hyperlipidemia   . Pain in joint, lower leg   . Peripheral arterial disease ([place])   . Postherpetic neuralgia   . Proteinuria   . Stroke ([place]) 01/2017  . Type II or unspecified type diabetes mellitus with renal manifestations, not stated as uncontrolled(250.40)   . Type II or unspecified type diabetes mellitus with renal manifestations, uncontrolled(250.42)   . Unspecified disorder of kidney and ureter   . Unspecified essential hypertension     Past Surgical History:  Procedure Laterality Date  . hysterectomy    . INCISION AND DRAINAGE Left 05/27/14   sebacous cyst, ear  . PRP Left    Dr. Ricki Miller  . removal of cyst from hand    . removal of tumor from foot    . TONSILLECTOMY       Home Medications:  Prior to Admission medications   Medication Sig Start Date End Date Taking? Authorizing Provider  amLODipine (NORVASC) 10 MG tablet TAKE 1 TABLET (10 MG TOTAL) BY MOUTH DAILY. FOR HIGH BLOOD PRESSURE 06/13/19   Reed, Tiffany L, DO  atorvastatin (LIPITOR) 20 MG tablet TAKE 1 TABLET BY MOUTH EVERY DAY Patient taking differently: Take 20 mg by mouth daily.  06/11/19   Despina Hick, MD  carvedilol (COREG) 25 MG tablet TAKE 1 TABLET BY MOUTH TWICE A DAY WITH A MEAL Patient taking differently: Take 25 mg by mouth 2 (two) times daily with a meal.  12/26/18   Reed, Tiffany L, DO  clopidogrel (PLAVIX) 75 MG tablet TAKE 1 TABLET BY MOUTH EVERY DAY 10/25/19   Reed, Tiffany L, DO   doxycycline (VIBRA-TABS) 100 MG tablet Take 100 mg by mouth 2 (two) times daily.    [provider]  HYDROcodone-acetaminophen (NORCO) 5-325 MG tablet Take 1 tablet by mouth every 6 (six) hours as needed for severe pain. 10/24/19   Reed, Tiffany L, DO  Insulin Glargine (LANTUS) 100 UNIT/ML Solostar Pen INJECT 48 UNITS UNDER THE SKIN DAILY FOR DIABETES Patient taking differently: Inject 48 Units into the skin daily.  10/09/19   Ngetich, [name] C, NP  insulin lispro (HUMALOG KWIKPEN) 100 UNIT/ML KwikPen Inject 0.03 mLs (3 Units total) into the skin 3 (three) times daily. 10/18/19   Reed, Tiffany L, DO  Insulin Pen Needle (B-D ULTRAFINE III SHORT PEN) 31G X 8 MM MISC Use to check blood sugar every day. Dx: 11.29; 11.65 07/02/19   Reed, Tiffany L, DO  ONETOUCH VERIO test strip USE TO TEST BLOOD SUGAR THREE TIMES DAILY. DX: E11.9 10/08/19   Reed, Tiffany L, DO  potassium chloride SA (KLOR-CON) 20 MEQ tablet Take 1 tablet (20 mEq total) by mouth 2 (two) times daily for 3 days. 10/11/19 10/22/19  Sherwood Gambler, MD  valsartan-hydrochlorothiazide (DIOVAN-HCT) 320-25 MG tablet TAKE 1 TABLET BY MOUTH EVERY DAY 10/25/19   [name], DO    Inpatient Medications: Scheduled Meds: . sodium chloride flush  3 mL Intravenous Once   Continuous Infusions: . cefTRIAXone (ROCEPHIN)  IV    . sodium chloride    . vancomycin     PRN Meds:   Allergies:    Allergies  Allergen Reactions  . Invokana [Canagliflozin] Other (See Comments)    Vagina itching and swelling Vaginal Itching and irritation   . Jardiance [Empagliflozin] Itching and Other (See Comments)    Vaginal itching and swelling    Social History:   Social History   Socioeconomic History  . Marital status: Widowed    Spouse name: Not on file  . Number of children: 6  . Years of education: 51  . Highest education level: Not on file  Occupational History    Comment: retired, [employer]  Social Needs  . Financial resource strain: Not  hard at all  . Food insecurity    Worry: Never true    Inability: Never true  . Transportation needs    Medical: Yes    Non-medical: Yes  Tobacco Use  . Smoking status: Former Smoker    Types: Cigarettes  . Smokeless tobacco: Never Used  . Tobacco comment: Quit about age 36   Substance and Sexual Activity  . Alcohol use: No    Alcohol/week: 0.0 standard drinks  . Drug use: No  . Sexual activity: Never  Lifestyle  . Physical activity    Days per week: 0 days    Minutes per session: 0 min  . Stress: Not at all  Relationships  . Social connections    Talks on phone: More than three times a week    Gets together: Twice a week  Attends religious service: 1 to 4 times per year    Active member of club or organization: No    Attends meetings of clubs or organizations: Never    Relationship status: Widowed  . Intimate partner violence    Fear of current or ex partner: No    Emotionally abused: No    Physically abused: No    Forced sexual activity: No  Other Topics Concern  . Not on file  Social History Narrative   Lives alone, son there occass   Caffeine- coffee 2-3 cups daily, soda off and on    Family History:    Family History  Problem Relation Age of Onset  . Diabetes Mother   . Diabetes Father   . Diabetes Sister   . Diabetes Sister      ROS:  Please see the history of present illness.  ROS  All other ROS reviewed and negative.     Physical Exam/Data:   Vitals:   10/29/19 1306  BP: (!) 139/53  Pulse: 76  Resp: 14  Temp: 98.4 F (36.9 C)  TempSrc: Oral  SpO2: 100%   No intake or output data in the 24 hours ending 10/29/19 1626 There were no vitals filed for this visit. There is no height or weight on file to calculate BMI.  General:  Thin elderly female laying in bed in no acute distress HEENT: sclera anicteric  Lymph: no adenopathy Neck: no JVD Endocrine:  No thryomegaly Vascular: No carotid bruits; distal pulses not palpable, +femoral  pulses Cardiac:  normal S1, S2; RRR; no murmurs, rubs, or gallops Lungs:  clear to auscultation bilaterally, no wheezing, rhonchi or rales  Abd: NABS, soft, nontender, no hepatomegaly Ext: no edema Musculoskeletal:  No deformities, BUE and BLE strength normal and equal Skin: warm and dry; gangrenous 3rd/4th toes on RLE Neuro:  CNs 2-12 intact, no focal abnormalities noted Psych:  Normal affect   EKG:  The EKG was personally reviewed and demonstrates:  Telemetry:  Telemetry was personally reviewed and demonstrates:  Sinus rhythm, rate 78 bpm, no STE/D, no TWI; no significant change from previous  Relevant CV Studies: LE arterial duplex 10/16/2019: Calcific atherosclerosis throughout the aorta and bilateral lower extremity arteries.  Right: Total occlusion noted in the superficial femoral artery. Total occlusion noted in the posterior tibial artery. Total occlusion noted in the peroneal artery.  Left: Total occlusion noted in the superficial femoral artery. Total occlusion noted in the superficial femoral artery and/or popliteal artery. Total occlusion noted in the posterior tibial artery. Suspected occlusion of the left common iliac artery;  unable to detect flow within the vessel and blunted monophasic waveforms in the external iliac and common femoral arteries.  ABI's 10/16/2019: Right: Resting right ankle-brachial index indicates noncompressible right lower extremity arteries. The right toe-brachial index is abnormal.  Waveform analysis is consistent with severe peripheral arterial disease. Left: Resting left ankle-brachial index indicates noncompressible left lower extremity arteries. The left toe-brachial index is abnormal.  Waveform analysis is consistent with severe perihperal arterial disease.  Echocardiogram 01/2017: Study Conclusions  - Left ventricle: The cavity size was normal. There was severe   focal basal hypertrophy. Systolic function was normal. The   estimated  ejection fraction was in the range of 60% to 65%. Wall   motion was normal; there were no regional wall motion   abnormalities. There was an increased relative contribution of   atrial contraction to ventricular filling. Doppler parameters are   consistent with abnormal left  ventricular relaxation (grade 1   diastolic dysfunction). - Aortic valve: Trileaflet; normal thickness, moderately calcified   leaflets. - Pulmonary arteries: PA peak pressure: 33 mm Hg (S).    Laboratory Data:  Chemistry Recent Labs  Lab 10/29/19 1309  NA 125*  K 4.2  CL 92*  CO2 19*  GLUCOSE 328*  BUN 31*  CREATININE 1.68*  CALCIUM 10.2  GFRNONAA 28*  GFRAA 32*  ANIONGAP 14    Recent Labs  Lab 10/29/19 1309  PROT 6.3*  ALBUMIN 1.9*  AST 26  ALT 19  ALKPHOS 68  BILITOT 1.1   Hematology Recent Labs  Lab 10/29/19 1309  WBC 25.6*  RBC 2.51*  HGB 7.5*  HCT 23.6*  MCV 94.0  MCH 29.9  MCHC 31.8  RDW 13.5  PLT 454*   Cardiac EnzymesNo results for input(s): TROPONINI in the last 168 hours. No results for input(s): TROPIPOC in the last 168 hours.  BNPNo results for input(s): BNP, PROBNP in the last 168 hours.  DDimer No results for input(s): DDIMER in the last 168 hours.  Radiology/Studies:  Ct Head Wo Contrast  Result Date: 10/29/2019 CLINICAL DATA:  Altered mental status EXAM: CT HEAD WITHOUT CONTRAST TECHNIQUE: Contiguous axial images were obtained from the base of the skull through the vertex without intravenous contrast. COMPARISON:  02/17/2017 FINDINGS: Brain: No evidence of acute infarction, hemorrhage, hydrocephalus, extra-axial collection or mass lesion/mass effect. Scattered low-density changes within the periventricular and subcortical white matter compatible with chronic microvascular ischemic change. Mild diffuse cerebral volume loss. Vascular: Mild atherosclerotic calcifications involving the large vessels of the skull base. No unexpected hyperdense vessel. Skull: Normal.  Negative for fracture or focal lesion. Sinuses/Orbits: Chronic partial opacification involving a few right ethmoid air cells. Remaining paranasal sinuses and mastoid air cells are clear. Orbital structures within normal limits. Other: None. IMPRESSION: 1.  No acute intracranial findings. 2.  Chronic microvascular ischemic change and cerebral volume loss. Electronically Signed   By: Davina Poke M.D.   On: 10/29/2019 15:03    Assessment and Plan:   1. PAD with critical limb ischemia: seen outpatient by Dr. Fletcher Anon 10/22/2019 with plans for abdominal aortogram with LE runnoff and possible endovascular intervention 10/31/2019. Patient now here with AMS felt to be 2/2 cellulitis and possible osteomyelitis. Started on broad spectrum antibiotics - Plan to go forward with procedure 10/31/2019  - Continue plavix and statin - Will start aspirin - Will defer antibiotics to primary team  2. HTN: BP mildly elevated. - Resume home amlodipine, carvedilol, valsartan-HCTZ  3. DM type 2: A1C 9.5; poorly controlled.  - Continue management per primary team  4. HLD: LDL 60 01/2019 - Continue statin     For questions or updates, please contact [place] Please consult [email or web address] for contact info under Cardiology/STEMI.   Signed, Abigail Butts, PA-C  10/29/2019 4:26 PM [phone]  I have seen and examined the patient along with Abigail Butts, PA-C .  I have reviewed the chart, notes and new data.  I agree with PA/NP's note.  Key new complaints: recalls confusion, but is currently very well oriented [facility]", "2020", "Tomorrow is vote day"). Denies angina or dyspnea. Key examination changes: Lying fully supine without dyspnea, mucous membranes appear dry, no JVD or edema, normal CV exam without murmurs. Gangrenous 3rd and 4th toes and corresponding metatarsal rays of R foot, palpable femoral pulse, but no popliteal or pedal pulses. Key new findings / data: WBC 25.6, Hgb 7.5,  hyponatremia  125, elevated creat 1.68 (baseline 1.4-1.6), nonischemic ECG, xray suspicious for osteomyelitis L foot.  PLAN: Will make sure arrangements remain in place for angiography w Dr. Fletcher Anon as scheduled on Wednesday. Antibiotics. Gentle hydration. Unfortunately, the foot does not look viable.  Sanda Klein, MD, [place] [phone] 10/29/2019, 6:19 PM

## 2019-10-29 NOTE — Patient Outreach (Signed)
[facility]) Care Management  10/29/2019  Monique Cabrera Feb 14, 1935 [id number]   Follow up call to patient today.  During last outreach, patient reported that she was contacted by [employer] but has not yet been assessed.  She was expecting a call back from them but has not heard back as of today.  [employer] for clarification. Per [facility] representative, patient was assessed on 10/25/19 and has been approved for services.  She should receive a letter informing her of how many service hours she was approved for.  Patient needs to select pcs agency.   Attempted to call patient back x2 to inform her of this but there was no answer or option to leave message.  Will attempt to reach again within four business days.  Ronn Melena, BSW Social Worker [phone]

## 2019-10-29 NOTE — ED Notes (Signed)
Floor agreed to take pt, neuro checks changed to Q4 by admitting provider

## 2019-10-29 NOTE — ED Notes (Addendum)
Called pt daughter with info [phone]

## 2019-10-30 ENCOUNTER — Ambulatory Visit (HOSPITAL_BASED_OUTPATIENT_CLINIC_OR_DEPARTMENT_OTHER): Payer: Medicare Other | Admitting: Internal Medicine

## 2019-10-30 ENCOUNTER — Encounter (HOSPITAL_COMMUNITY): Payer: Self-pay | Admitting: Family Medicine

## 2019-10-30 DIAGNOSIS — I96 Gangrene, not elsewhere classified: Secondary | ICD-10-CM

## 2019-10-30 DIAGNOSIS — N182 Chronic kidney disease, stage 2 (mild): Secondary | ICD-10-CM

## 2019-10-30 DIAGNOSIS — E7800 Pure hypercholesterolemia, unspecified: Secondary | ICD-10-CM

## 2019-10-30 DIAGNOSIS — E1165 Type 2 diabetes mellitus with hyperglycemia: Secondary | ICD-10-CM

## 2019-10-30 DIAGNOSIS — E1129 Type 2 diabetes mellitus with other diabetic kidney complication: Secondary | ICD-10-CM

## 2019-10-30 DIAGNOSIS — I70213 Atherosclerosis of native arteries of extremities with intermittent claudication, bilateral legs: Secondary | ICD-10-CM

## 2019-10-30 DIAGNOSIS — M86171 Other acute osteomyelitis, right ankle and foot: Secondary | ICD-10-CM

## 2019-10-30 LAB — CBC
HCT: 20.6 % — ABNORMAL LOW (ref 36.0–46.0)
HCT: 19.7 % — ABNORMAL LOW (ref 36.0–46.0)
Hemoglobin: 6.8 g/dL — CL (ref 12.0–15.0)
Hemoglobin: 6.6 g/dL — CL (ref 12.0–15.0)
MCH: 29.1 pg (ref 26.0–34.0)
MCH: 28.9 pg (ref 26.0–34.0)
MCHC: 33 g/dL (ref 30.0–36.0)
MCHC: 33.5 g/dL (ref 30.0–36.0)
MCV: 88 fL (ref 80.0–100.0)
MCV: 86.4 fL (ref 80.0–100.0)
Platelets: 491 10*3/uL — ABNORMAL HIGH (ref 150–400)
Platelets: 511 10*3/uL — ABNORMAL HIGH (ref 150–400)
RBC: 2.34 MIL/uL — ABNORMAL LOW (ref 3.87–5.11)
RBC: 2.28 MIL/uL — ABNORMAL LOW (ref 3.87–5.11)
RDW: 13.5 % (ref 11.5–15.5)
RDW: 13.4 % (ref 11.5–15.5)
WBC: 25.5 10*3/uL — ABNORMAL HIGH (ref 4.0–10.5)
WBC: 25.3 10*3/uL — ABNORMAL HIGH (ref 4.0–10.5)
nRBC: 0 % (ref 0.0–0.2)
nRBC: 0 % (ref 0.0–0.2)

## 2019-10-30 LAB — BASIC METABOLIC PANEL
Anion gap: 10 (ref 5–15)
BUN: 25 mg/dL — ABNORMAL HIGH (ref 8–23)
CO2: 20 mmol/L — ABNORMAL LOW (ref 22–32)
Calcium: 10.5 mg/dL — ABNORMAL HIGH (ref 8.9–10.3)
Chloride: 99 mmol/L (ref 98–111)
Creatinine, Ser: 1.27 mg/dL — ABNORMAL HIGH (ref 0.44–1.00)
GFR calc Af Amer: 46 mL/min — ABNORMAL LOW (ref >60)
GFR calc non Af Amer: 39 mL/min — ABNORMAL LOW (ref >60)
Glucose, Bld: 78 mg/dL (ref 70–99)
Potassium: 3.6 mmol/L (ref 3.5–5.1)
Sodium: 129 mmol/L — ABNORMAL LOW (ref 135–145)

## 2019-10-30 LAB — HEMOGLOBIN A1C
Hgb A1c MFr Bld: 8.9 % — ABNORMAL HIGH (ref 4.8–5.6)
Mean Plasma Glucose: 208.73 mg/dL

## 2019-10-30 LAB — GLUCOSE, CAPILLARY
Glucose-Capillary: 257 mg/dL — ABNORMAL HIGH (ref 70–99)
Glucose-Capillary: 85 mg/dL (ref 70–99)
Glucose-Capillary: 72 mg/dL (ref 70–99)
Glucose-Capillary: 72 mg/dL (ref 70–99)

## 2019-10-30 LAB — SURGICAL PCR SCREEN
MRSA, PCR: NEGATIVE
Staphylococcus aureus: NEGATIVE

## 2019-10-30 LAB — LACTIC ACID, PLASMA
Lactic Acid, Venous: 1.2 mmol/L (ref 0.5–1.9)
Lactic Acid, Venous: 1.3 mmol/L (ref 0.5–1.9)

## 2019-10-30 LAB — PROTIME-INR
INR: 1.3 — ABNORMAL HIGH (ref 0.8–1.2)
Prothrombin Time: 16.2 seconds — ABNORMAL HIGH (ref 11.4–15.2)

## 2019-10-30 LAB — PREPARE RBC (CROSSMATCH)

## 2019-10-30 LAB — ABO/RH: ABO/RH(D): B POS

## 2019-10-30 LAB — PHOSPHORUS: Phosphorus: 2.1 mg/dL — ABNORMAL LOW (ref 2.5–4.6)

## 2019-10-30 LAB — APTT: aPTT: 34 seconds (ref 24–36)

## 2019-10-30 LAB — MAGNESIUM: Magnesium: 1.6 mg/dL — ABNORMAL LOW (ref 1.7–2.4)

## 2019-10-30 LAB — TSH: TSH: 0.989 u[IU]/mL (ref 0.350–4.500)

## 2019-10-30 MED ORDER — ADULT MULTIVITAMIN W/MINERALS CH
1.0000 | ORAL_TABLET | Freq: Every day | ORAL | Status: DC
  Administered 2019-10-31 – 2019-11-04 (×4): 1 via ORAL
  Filled 2019-10-30 (×4): qty 1

## 2019-10-30 MED ORDER — INSULIN ASPART 100 UNIT/ML ~~LOC~~ SOLN
0.0000 [IU] | Freq: Three times a day (TID) | SUBCUTANEOUS | Status: DC
  Administered 2019-10-31: 2 [IU] via SUBCUTANEOUS
  Administered 2019-11-01: 5 [IU] via SUBCUTANEOUS
  Administered 2019-11-01: 7 [IU] via SUBCUTANEOUS
  Administered 2019-11-01: 2 [IU] via SUBCUTANEOUS
  Administered 2019-11-02 – 2019-11-03 (×3): 1 [IU] via SUBCUTANEOUS
  Administered 2019-11-03: 2 [IU] via SUBCUTANEOUS
  Administered 2019-11-04: 1 [IU] via SUBCUTANEOUS

## 2019-10-30 MED ORDER — SODIUM CHLORIDE 0.9 % IV SOLN: 250.0000 mL | INTRAVENOUS | Status: DC | PRN

## 2019-10-30 MED ORDER — SODIUM CHLORIDE 0.9% FLUSH: 3.0000 mL | INTRAVENOUS | Status: DC | PRN

## 2019-10-30 MED ORDER — INSULIN GLARGINE 100 UNIT/ML ~~LOC~~ SOLN: 48.0000 [IU] | Freq: Every day | SUBCUTANEOUS | Status: DC

## 2019-10-30 MED ORDER — MAGNESIUM SULFATE 2 GM/50ML IV SOLN
2.0000 g | Freq: Once | INTRAVENOUS | Status: AC
  Administered 2019-10-30: 2 g via INTRAVENOUS
  Filled 2019-10-30: qty 50

## 2019-10-30 MED ORDER — ASPIRIN 81 MG PO CHEW
81.0000 mg | CHEWABLE_TABLET | ORAL | Status: AC
  Administered 2019-10-31: 81 mg via ORAL

## 2019-10-30 MED ORDER — SODIUM CHLORIDE 0.9 % IV SOLN
INTRAVENOUS | Status: DC
  Administered 2019-10-30 (×2): via INTRAVENOUS

## 2019-10-30 MED ORDER — SODIUM CHLORIDE 0.9 % IV SOLN
INTRAVENOUS | Status: DC
  Administered 2019-10-30 – 2019-10-31 (×2): via INTRAVENOUS

## 2019-10-30 MED ORDER — MUPIROCIN 2 % EX OINT
1.0000 "application " | TOPICAL_OINTMENT | Freq: Two times a day (BID) | CUTANEOUS | Status: DC
  Administered 2019-10-31 – 2019-11-04 (×9): 1 via NASAL
  Filled 2019-10-30 (×4): qty 22

## 2019-10-30 MED ORDER — ENSURE ENLIVE PO LIQD
237.0000 mL | Freq: Three times a day (TID) | ORAL | Status: DC
  Administered 2019-11-01 – 2019-11-03 (×5): 237 mL via ORAL

## 2019-10-30 MED ORDER — INSULIN GLARGINE 100 UNIT/ML ~~LOC~~ SOLN
20.0000 [IU] | Freq: Every day | SUBCUTANEOUS | Status: DC
  Filled 2019-10-30 (×2): qty 0.2

## 2019-10-30 MED ORDER — SODIUM CHLORIDE 0.9% IV SOLUTION
Freq: Once | INTRAVENOUS | Status: AC
  Administered 2019-10-30: 07:00:00 via INTRAVENOUS

## 2019-10-30 MED ORDER — PRO-STAT SUGAR FREE PO LIQD
30.0000 mL | Freq: Two times a day (BID) | ORAL | Status: DC
  Filled 2019-10-30: qty 30

## 2019-10-30 MED ORDER — SODIUM CHLORIDE 0.9% FLUSH
3.0000 mL | Freq: Two times a day (BID) | INTRAVENOUS | Status: DC
  Administered 2019-10-31: 3 mL via INTRAVENOUS

## 2019-10-30 MED ORDER — VANCOMYCIN HCL 10 G IV SOLR
1250.0000 mg | INTRAVENOUS | Status: DC
  Administered 2019-10-31: 1250 mg via INTRAVENOUS
  Filled 2019-10-30 (×2): qty 1250

## 2019-10-30 MED ORDER — SODIUM CHLORIDE 0.9% IV SOLUTION: Freq: Once | INTRAVENOUS | Status: DC

## 2019-10-30 NOTE — Progress Notes (Signed)
NT went to check blood sugar, pt was asleep, iv was out of arm, new iv placed and blood restarted which was off about 15 min still within infusion paramater, pt said she was not sure what happened, also the foot dressing was lying on the bedside table, pt was bathed and complete linen and gown changed, new iv wrapped with kerlix

## 2019-10-30 NOTE — Progress Notes (Signed)
Attempted to get pt to eat but grunts and pulls over her head, duaghter was here earlier and unable to et her to eat lunch, will not let nurse redress foot, daughter says she would not keep dressing on at home, consent for procedure was signed by  daughrter

## 2019-10-30 NOTE — Progress Notes (Signed)
Inpatient Diabetes Program Recommendations  AACE/ADA: New Consensus Statement on Inpatient Glycemic Control (2015)  Target Ranges:  Prepandial:   less than 140 mg/dL      Peak postprandial:   less than 180 mg/dL (1-2 hours)      Critically ill patients:  140 - 180 mg/dL   Lab Results  Component Value Date   GLUCAP 72 10/30/2019   HGBA1C 8.9 (H) 10/29/2019    Review of Glycemic Control  Diabetes history: DM2 Outpatient Diabetes medications: Lantus 48 units QD, Humalog 3 units tidwc Current orders for Inpatient glycemic control: Lantus 20 units QD   HgbA1C - 8.9% - not accurate with very low H/H  Inpatient Diabetes Program Recommendations:     Avoid hypoglycemia at discharge with decreasing basal insulin for home.   Will continue to follow.  Thank you. Lorenda Peck, RD, LDN, CDE Inpatient Diabetes Coordinator [phone]

## 2019-10-30 NOTE — Progress Notes (Signed)
Progress Note  Patient Name: Monique Cabrera Date of Encounter: 10/30/2019  Primary Cardiologist:  Fletcher Anon  Subjective   Asleep, calm. Appears calm, but confused. When asked about pain she reports none, but later started crying.  Inpatient Medications    Scheduled Meds: . amLODipine  10 mg Oral Daily  . aspirin  81 mg Oral Daily  . atorvastatin  20 mg Oral Daily  . carvedilol  25 mg Oral BID WC  . clopidogrel  75 mg Oral Daily  . feeding supplement (ENSURE ENLIVE)  237 mL Oral TID BM  . feeding supplement (PRO-STAT SUGAR FREE 64)  30 mL Oral BID WC  . insulin aspart  0-9 Units Subcutaneous TID WC  . [START ON 10/31/2019] insulin glargine  20 Units Subcutaneous Daily  . multivitamin with minerals  1 tablet Oral Daily   Continuous Infusions: . sodium chloride Stopped (10/30/19 1141)  . cefTRIAXone (ROCEPHIN)  IV    . metronidazole 500 mg (10/30/19 1062)  . [START ON 10/31/2019] vancomycin     PRN Meds: acetaminophen **OR** acetaminophen, HYDROcodone-acetaminophen, ondansetron **OR** ondansetron (ZOFRAN) IV, polyethylene glycol   Vital Signs    Vitals:   10/30/19 0451 10/30/19 0842 10/30/19 1129 10/30/19 1156  BP: 129/60 (!) 133/92 (!) 149/52 (!) 142/50  Pulse: 79 81 70 72  Resp: 18 16 18 16   Temp: 98.7 F (37.1 C) 98.6 F (37 C) 99.1 F (37.3 C) 98 F (36.7 C)  TempSrc:  Axillary Oral Oral  SpO2: 99% 98% 100% 98%  Weight: 58.7 kg       Intake/Output Summary (Last 24 hours) at 10/30/2019 1202 Last data filed at 10/30/2019 1141 Gross per 24 hour  Intake 1390.7 ml  Output -  Net 1390.7 ml   Last 3 Weights 10/30/2019 10/22/2019 10/18/2019  Weight (lbs) 129 lb 6.4 oz 137 lb 137 lb 9.6 oz  Weight (kg) 58.695 kg 62.143 kg 62.415 kg      Telemetry    NSR - Personally Reviewed  ECG    NSR, nonspecific T wave changes - Personally Reviewed  Physical Exam  Lying fully supine, no respiratory difficulty GEN: No acute distress.   Neck: No JVD Cardiac: RRR, no  murmurs, rubs, or gallops.  Respiratory: Clear to auscultation bilaterally. GI: Soft, nontender, non-distended  MS: gangrenous R 3rd and 4th metatarsal rays and toes. No palpable pulses beneath the R femoral Neuro:  Nonfocal  Psych: Crying, but does not acknowledge a cause  Labs    High Sensitivity Troponin:  No results for input(s): TROPONINIHS in the last 720 hours.    Chemistry Recent Labs  Lab 10/29/19 1309 10/30/19 0552  NA 125* 129*  K 4.2 3.6  CL 92* 99  CO2 19* 20*  GLUCOSE 328* 78  BUN 31* 25*  CREATININE 1.68* 1.27*  CALCIUM 10.2 10.5*  PROT 6.3*  --   ALBUMIN 1.9*  --   AST 26  --   ALT 19  --   ALKPHOS 68  --   BILITOT 1.1  --   GFRNONAA 28* 39*  GFRAA 32* 46*  ANIONGAP 14 10     Hematology Recent Labs  Lab 10/29/19 1309 10/29/19 2002 10/29/19 2336 10/30/19 0552  WBC 25.6*  --  25.5* 25.3*  RBC 2.51* 2.17* 2.34* 2.28*  HGB 7.5*  --  6.8* 6.6*  HCT 23.6*  --  20.6* 19.7*  MCV 94.0  --  88.0 86.4  MCH 29.9  --  29.1 28.9  MCHC  31.8  --  33.0 33.5  RDW 13.5  --  13.5 13.4  PLT 454*  --  491* 511*    BNPNo results for input(s): BNP, PROBNP in the last 168 hours.   DDimer No results for input(s): DDIMER in the last 168 hours.   Radiology    Ct Head Wo Contrast  Result Date: 10/29/2019 CLINICAL DATA:  Altered mental status EXAM: CT HEAD WITHOUT CONTRAST TECHNIQUE: Contiguous axial images were obtained from the base of the skull through the vertex without intravenous contrast. COMPARISON:  02/17/2017 FINDINGS: Brain: No evidence of acute infarction, hemorrhage, hydrocephalus, extra-axial collection or mass lesion/mass effect. Scattered low-density changes within the periventricular and subcortical white matter compatible with chronic microvascular ischemic change. Mild diffuse cerebral volume loss. Vascular: Mild atherosclerotic calcifications involving the large vessels of the skull base. No unexpected hyperdense vessel. Skull: Normal. Negative for  fracture or focal lesion. Sinuses/Orbits: Chronic partial opacification involving a few right ethmoid air cells. Remaining paranasal sinuses and mastoid air cells are clear. Orbital structures within normal limits. Other: None. IMPRESSION: 1.  No acute intracranial findings. 2.  Chronic microvascular ischemic change and cerebral volume loss. Electronically Signed   By: Davina Poke M.D.   On: 10/29/2019 15:03   Dg Foot Complete Right  Result Date: 10/29/2019 CLINICAL DATA:  Pt c/o right foot infection and diabetes wounds for an unknown period of time. Hx of DM. Patient is scheduled for surgical removal of infection on the right foot. EXAM: RIGHT FOOT COMPLETE - 3+ VIEW COMPARISON:  10/04/2019 FINDINGS: Soft tissue swelling with soft tissue air is noted along the dorsal lateral aspect of the foot, from just distal to the ankle joint to the lateral metatarsophalangeal joints. There is a subtle area apparent loss of the white cortical line along the lateral plantar margin of the cuboid which could reflect osteomyelitis. There is evidence of resorption along the medial base of the proximal phalanx of the fifth toe which could reflect osteomyelitis. No other evidence of osteomyelitis. No fractures. Joints are normally aligned. Stable first metatarsophalangeal joint osteoarthritis. Small plantar and dorsal calcaneal spurs. There are dense arterial vascular calcifications from the ankle to the foot. IMPRESSION: 1. Right foot infection with soft tissue swelling and soft tissue air along the dorsal lateral aspect of the foot as described. 2. Possible small focus of osteomyelitis along the plantar lateral aspect of the cuboid with another possible area of osteomyelitis along medial base of the proximal phalanx of the fifth toe. No other evidence of osteomyelitis. Electronically Signed   By: Lajean Manes M.D.   On: 10/29/2019 16:56    Cardiac Studies   Doppler 10/16/2019 Summary:  Calcific atherosclerosis  throughout the aorta and bilateral lower extremity arteries.  Right: Total occlusion noted in the superficial femoral artery. Total occlusion noted in the posterior tibial artery. Total occlusion noted in the peroneal artery.  Left: Total occlusion noted in the superficial femoral artery. Total occlusion noted in the superficial femoral artery and/or popliteal artery. Total occlusion noted in the posterior tibial artery. Suspected occlusion of the left common iliac artery;  unable to detect flow within the vessel and blunted monophasic waveforms in the external iliac and common femoral arteries.  Patient Profile     84 y.o. female with severe PAD and gangrene/osteomyelitis R foot.  Assessment & Plan    1,. PAD: severe bilateral lower extremity arterial disease. Plan aortogram tomorrow. This procedure has been fully reviewed with the patient and written informed consent  has been obtained. On dual antiplatelet therapy and statin. 2. HLP: LDL 60 earlier this year. 3. Ac on CKD: creatinine improved, probably at baseline. Na also improving w IV fluids. 4. Sepsis:  Intermittent confusion. 5. DM:  Poor chronic control. Possible recent hypoglycemia episodes related to infection. 6. HTN:  Fair control. ARB and thiazide on hold for cath and for AKI.      For questions or updates, please contact [place] Please consult [email or web address] for contact info under        Signed, Sanda Klein, MD  10/30/2019, 12:02 PM

## 2019-10-30 NOTE — Progress Notes (Signed)
Pt more alert and able to answer questions, she does not remember events of yesterday, was able to discuss blood tranfusion and sign consent, pt cyrig and says it hurts rubbing right leg, asked again if wanted tsometing for pain and said yes, am meds given, drank small liquid, vss, blood transfusion started, no reaction after15 min, pt will doze off and then wake up crying,pt did allow nurse to do wound care and dressing

## 2019-10-30 NOTE — Progress Notes (Signed)
Pt continues to cry and look at foot. I keep asking if she wants pain medication and says no. I sat with pt and tried to console

## 2019-10-30 NOTE — Consult Note (Signed)
   [facility] Inpatient Consult   10/30/2019  MAHLANI BERNINGER 1935/10/07 [id number]    [facility] Active status:   Patient is currently active with [facility] [employer].  She had been previously engaged by [facility], SW, pharmacy and pharmacy tech.Our community based plan of care has focused on disease management and community resource support.  Patient has been recently engaged by [facility] SW for community resources and [facility] RN CM for DM disease management.   Patient will receive a post hospital call and will be evaluated for assessments and disease process education if transitioning to home.  Patient was reviewed for 24% high risk score for unplanned readmission and hospitalizations with 3 ED visits in the past 6 months under her [employer] benefits.   Chart reviewed andper MDbrief admission historyreveals as: 84 y/o female from home with PAD, HLD, HTN, CKD stage 3, h/o CVA on plavix, anemia,    presents with critical ischemia of right foot. Dr. Fletcher Anon planning for abdominal aortogram with LE runoff and possible endovascular intervention 10/31/19.  [Sepsis secondary to osteomyelitis right foot, Type 2 DM - poorly controlled as evidenced by an a1c of 9.5%, anemia]  Her primary care provider is Hollace Kinnier, DO with [facility], listed to provide transition of care.  Plan: Will follow progress and recommendations for disposition (post planned interventions) with inpatient [facility] team and make aware that [facility] CM following. Will update [facility] RN CM and [facility] SW of disposition/ needs.   For additional questions or referrals,please contact:  Markevius Trombetta A. Reubin Bushnell, BSN, RN-BC [facility] Liaison Cell: [phone]

## 2019-10-30 NOTE — Progress Notes (Signed)
Patient refused NT to obtain vital signs this shift. Patient is also soaked in urine and refused for NT and this RN to help clean her. Patient has also refused for RN to change dressing to right foot. Will attempt again. RN will continue to monitor patient.   Ermalinda Memos, RN

## 2019-10-30 NOTE — Progress Notes (Signed)
PROGRESS NOTE    Monique Cabrera  MOQ:[id number]  DOB: [birth year]  DOA: 10/29/2019 PCP: [name], DO   Brief Admission Hx: 84 y/o female from home with PAD, HLD, HTN, CKDstage 3, h/o CVA on plavix, anemia and presents with critical ischemia right foot.   MDM/Assessment & Plan:   1. Critical ischemia of right foot - Dr. Fletcher Anon planning for abdominal aortogram with LE runoff and possible endovascular intervention 10/31/19.  Pt is on aspirin, plavix, statin therapy and broad spectrum antibiotics.  2. Sepsis secondary to osteomyelitis right foot - pt is clinically improving with supportive care and antibiotics.  3. Osteomyelitis right foot - Pt is on broad spectrum antibiotic coverage for now, but very high likelihood patient is going to require operative management of this. 4. Type 2 DM - poorly controlled as evidenced by an a1c of 9.5%.  Continue SSI coverage and CBG testing.  5. Essential hypertension - blood pressures are better controlled, following.  6. Dyslipidemia - continue statin treatment.  7.  PAD - possible endovascular intervention 11/4 with Dr. Fletcher Anon as scheduled.   8. Anemia in CKD - Pt is being transfused 1 unit PRBCs. Follow CBC. Follow anemia panel.  9. Hyponatremia - likely from dehydration, improving with IV normal saline. Follow.  10. AKI on CKD stage 2 - creatinine improving with IV fluids.  Following.   DVT prophylaxis: SCD Code Status: Full  Family Communication: could not reach family today Disposition Plan: continue inpatient treatments  Consultants:  cardiology  Procedures:    Antimicrobials:  Ceftriaxone 11/2 >>  Vancomycin  11/2 >  Metronidazole  11/2 >  Subjective: Pt refusing to talk to me this morning.   Objective: Vitals:   10/29/19 2147 10/30/19 0022 10/30/19 0451 10/30/19 0842  BP: (!) 163/57 (!) 158/62 129/60 (!) 133/92  Pulse: 79 76 79 81  Resp: 18 18 18 16   Temp: 99.4 F (37.4 C) 98.7 F (37.1 C) 98.7 F (37.1 C) 98.6  F (37 C)  TempSrc: Oral Oral  Axillary  SpO2: 97% 100% 99% 98%  Weight:   58.7 kg     Intake/Output Summary (Last 24 hours) at 10/30/2019 3546 Last data filed at 10/30/2019 0845 Gross per 24 hour  Intake 1234.56 ml  Output -  Net 1234.56 ml   Filed Weights   10/30/19 0451  Weight: 58.7 kg   REVIEW OF SYSTEMS  Unable to obtain, patient refusing to cooperate  Exam:  General exam: elderly frail female, lying in bed, no obvious distress or discomfort Respiratory system:  No increased work of breathing. Cardiovascular system: S1 & S2 heard. No JVD, murmurs, gallops, clicks or pedal edema. Gastrointestinal system: Abdomen is nondistended, soft and nontender. Normal bowel sounds heard. Central nervous system: Alert and oriented. No focal neurological deficits. Extremities: gangrene right foot, foul smell.  Data Reviewed: Basic Metabolic Panel: Recent Labs  Lab 10/29/19 1309 10/29/19 2336 10/30/19 0552  NA 125*  --  129*  K 4.2  --  3.6  CL 92*  --  99  CO2 19*  --  20*  GLUCOSE 328*  --  78  BUN 31*  --  25*  CREATININE 1.68*  --  1.27*  CALCIUM 10.2  --  10.5*  MG  --  1.6*  --   PHOS  --  2.1*  --    Liver Function Tests: Recent Labs  Lab 10/29/19 1309  AST 26  ALT 19  ALKPHOS 68  BILITOT 1.1  PROT 6.3*  ALBUMIN 1.9*   No results for input(s): LIPASE, AMYLASE in the last 168 hours. No results for input(s): AMMONIA in the last 168 hours. CBC: Recent Labs  Lab 10/29/19 1309 10/29/19 2336 10/30/19 0552  WBC 25.6* 25.5* 25.3*  HGB 7.5* 6.8* 6.6*  HCT 23.6* 20.6* 19.7*  MCV 94.0 88.0 86.4  PLT 454* 491* 511*   Cardiac Enzymes: No results for input(s): CKTOTAL, CKMB, CKMBINDEX, TROPONINI in the last 168 hours. CBG (last 3)  Recent Labs    10/29/19 1748 10/29/19 2158 10/30/19 0631  GLUCAP 321* 257* 85   Recent Results (from the past 240 hour(s))  SARS CORONAVIRUS 2 (TAT 6-24 HRS) Nasopharyngeal Nasopharyngeal Swab     Status: None   Collection  Time: 10/29/19  3:58 PM   Specimen: Nasopharyngeal Swab  Result Value Ref Range Status   SARS Coronavirus 2 NEGATIVE NEGATIVE Final    Comment: (NOTE) SARS-CoV-2 target nucleic acids are NOT DETECTED. The SARS-CoV-2 RNA is generally detectable in upper and lower respiratory specimens during the acute phase of infection. Negative results do not preclude SARS-CoV-2 infection, do not rule out co-infections with other pathogens, and should not be used as the sole basis for treatment or other patient management decisions. Negative results must be combined with clinical observations, patient history, and epidemiological information. The expected result is Negative. Fact Sheet for Patients: SugarRoll.be Fact Sheet for Healthcare Providers: https://www.woods-mathews.com/ This test is not yet approved or cleared by the [place] FDA and  has been authorized for detection and/or diagnosis of SARS-CoV-2 by FDA under an Emergency Use Authorization (EUA). This EUA will remain  in effect (meaning this test can be used) for the duration of the COVID-19 declaration under Section 56 4(b)(1) of the Act, 21 U.S.C. section 360bbb-3(b)(1), unless the authorization is terminated or revoked sooner. Performed at [facility], [place] [address], [place], [place] [address]   Culture, blood (routine x 2)     Status: None (Preliminary result)   Collection Time: 10/29/19  5:17 PM   Specimen: BLOOD RIGHT HAND  Result Value Ref Range Status   Specimen Description BLOOD RIGHT HAND  Final   Special Requests   Final    BOTTLES DRAWN AEROBIC AND ANAEROBIC Blood Culture results may not be optimal due to an inadequate volume of blood received in culture bottles   Culture   Final    NO GROWTH < 24 HOURS Performed at [facility], [place] [address], [place], [place] [address]    Report Status PENDING  Incomplete  Culture, blood (routine x 2)     Status: None  (Preliminary result)   Collection Time: 10/29/19  6:04 PM   Specimen: BLOOD  Result Value Ref Range Status   Specimen Description BLOOD LEFT ANTECUBITAL  Final   Special Requests   Final    BOTTLES DRAWN AEROBIC AND ANAEROBIC Blood Culture results may not be optimal due to an inadequate volume of blood received in culture bottles   Culture   Final    NO GROWTH < 24 HOURS Performed at [facility], [place] [address], [place], [place] [address]    Report Status PENDING  Incomplete     Studies: Ct Head Wo Contrast  Result Date: 10/29/2019 CLINICAL DATA:  Altered mental status EXAM: CT HEAD WITHOUT CONTRAST TECHNIQUE: Contiguous axial images were obtained from the base of the skull through the vertex without intravenous contrast. COMPARISON:  02/17/2017 FINDINGS: Brain: No evidence of  acute infarction, hemorrhage, hydrocephalus, extra-axial collection or mass lesion/mass effect. Scattered low-density changes within the periventricular and subcortical white matter compatible with chronic microvascular ischemic change. Mild diffuse cerebral volume loss. Vascular: Mild atherosclerotic calcifications involving the large vessels of the skull base. No unexpected hyperdense vessel. Skull: Normal. Negative for fracture or focal lesion. Sinuses/Orbits: Chronic partial opacification involving a few right ethmoid air cells. Remaining paranasal sinuses and mastoid air cells are clear. Orbital structures within normal limits. Other: None. IMPRESSION: 1.  No acute intracranial findings. 2.  Chronic microvascular ischemic change and cerebral volume loss. Electronically Signed   By: Davina Poke M.D.   On: 10/29/2019 15:03   Dg Foot Complete Right  Result Date: 10/29/2019 CLINICAL DATA:  Pt c/o right foot infection and diabetes wounds for an unknown period of time. Hx of DM. Patient is scheduled for surgical removal of infection on the right foot. EXAM: RIGHT FOOT COMPLETE - 3+ VIEW COMPARISON:  10/04/2019  FINDINGS: Soft tissue swelling with soft tissue air is noted along the dorsal lateral aspect of the foot, from just distal to the ankle joint to the lateral metatarsophalangeal joints. There is a subtle area apparent loss of the white cortical line along the lateral plantar margin of the cuboid which could reflect osteomyelitis. There is evidence of resorption along the medial base of the proximal phalanx of the fifth toe which could reflect osteomyelitis. No other evidence of osteomyelitis. No fractures. Joints are normally aligned. Stable first metatarsophalangeal joint osteoarthritis. Small plantar and dorsal calcaneal spurs. There are dense arterial vascular calcifications from the ankle to the foot. IMPRESSION: 1. Right foot infection with soft tissue swelling and soft tissue air along the dorsal lateral aspect of the foot as described. 2. Possible small focus of osteomyelitis along the plantar lateral aspect of the cuboid with another possible area of osteomyelitis along medial base of the proximal phalanx of the fifth toe. No other evidence of osteomyelitis. Electronically Signed   By: Lajean Manes M.D.   On: 10/29/2019 16:56   Scheduled Meds: . amLODipine  10 mg Oral Daily  . aspirin  81 mg Oral Daily  . atorvastatin  20 mg Oral Daily  . carvedilol  25 mg Oral BID WC  . clopidogrel  75 mg Oral Daily  . insulin aspart  0-9 Units Subcutaneous TID WC  . [START ON 10/31/2019] insulin glargine  48 Units Subcutaneous Daily   Continuous Infusions: . sodium chloride    . cefTRIAXone (ROCEPHIN)  IV    . metronidazole 500 mg (10/30/19 1308)  . vancomycin 1,000 mg (10/29/19 1845)    Active Problems:   Essential hypertension, benign   Atherosclerosis of native artery of extremity (HCC)   Chronic kidney disease (CKD), stage II (mild)   Peripheral arterial disease (HCC)   Anemia   Hypertension   Atherosclerosis of native artery of extremity with intermittent claudication (HCC)   Type II diabetes  mellitus with renal manifestations, uncontrolled (HCC)   Chronic hepatitis C without hepatic coma (HCC)   Hyperlipidemia   Osteomyelitis (HCC)   Hyponatremia   Chronic diastolic CHF (congestive heart failure) ([place])  Time spent:   Irwin Brakeman, MD [facility] 10/30/2019, 9:28 AM    LOS: 1 day  How to contact the [facility] Attending or Consulting provider [place] or covering provider during after hours [place], for this patient?  1. Check the care team in [facility] and look for a) attending/consulting TR[facility] provider listed and b) the [facility]  team listed 2. Log into [email or web address] and use [facility]'s universal password to access. If you do not have the password, please contact the hospital operator. 3. Locate the [facility] provider you are looking for under [facility] and page to a number that you can be directly reached. 4. If you still have difficulty reaching the provider, please page the [facility] (Director on Call) for the Hospitalists listed on amion for assistance.

## 2019-10-30 NOTE — Progress Notes (Signed)
Pt restles and taking off gown and covers, pt moans and cry, offered pain med but declined and refuses all meds, instructions given but still declines, spoke to son Corene Cornea on phone and hopefully he will be in and help get her to take medications, measure wound on rigth dorsal food as ordered but pt refused cleansing or putting on dressing, pt wont nurse hardly touch area. Pt refusing breakfast, mag infusing and  Almost complete, will start blood infusion after and ok per Dr Tery Sanfilippo

## 2019-10-30 NOTE — Progress Notes (Signed)
Patient refused her night before surgery/procedure CHG bath.

## 2019-10-30 NOTE — Consult Note (Signed)
[facility] Nurse wound consult note Patient consultation performed remotely with assistance from medical records, photographs taken in the ED yesterday and conversation with bedside RN. Reason for Consult:Patient with slow-to-heal right anterior foot ulcerations and discoloration of two digits due to PAD. Was previously worked up by Cardiology (Dr. Sallyanne Kuster) and scheduled for angiographywith Dr. Fletcher Anon on Wednesday, 11/4. Wound type:Severe PAD  Pressure Injury POA: N/A Measurement: Bedside RN to place measurements in the Nursing Flowsheet today. See Photo taken yesterday in [facility] ED by Dr. Tyron Russell. Wound DVO:UZHQU areas of red moist tissue beneath black discoloration that extens from digits 3 and 4 across dorsum of foot to lateral foot. Drainage (amount, consistency, odor): NonePeriwound: Dressing procedure/placement/frequency:Please see Dr. Victorino December note that indicates foot is not likely to be salvageable. In light of this and procedure tomorrow, I have provided Nursing with guidance for of the foot to include cleansing (to decrease bacterial count) and dressing only with dry gauze (between affected toes and over dorsal foot), secured with a few turns of Kerlix roll gauze.  [place] nursing team will not follow, but will remain available to this patient, the nursing and medical teams.  Please re-consult if needed. Thanks, Maudie Flakes, MSN, RN, [place], Arther Abbott  Pager# [phone]

## 2019-10-30 NOTE — Progress Notes (Signed)
Patient confused.  Patient removed gown, purewick, tele box. And IV.  Patient tearful.Patient refuses to answer questions.  Patient refused Lactic Acid lab draw.  Phlebotomy to retime due to patient confusion. Will page provider.

## 2019-10-30 NOTE — Progress Notes (Addendum)
Pharmacy Antibiotic Note  Monique Cabrera is a 84 y.o. female admitted on 10/29/2019 with osteomyelitis and critical limb ischemia of RLE .  Pharmacy has been consulted for vancomycin dosing. Scr trending down, vancomycin dose adjusted. Pending PAD vascular study 11/4, possible procedure this admission.   Plan: The dose of 1000 mg Q48 hr will be adjusted to 1250mg  Q48 hr  based on renal function.  Adjusted timing up (next dose due 40 hr from last dose) given improved renal function If Scr continues to downtrend, adjust to Q24 hr dosing  Order pk and tr levels around 3rd dose    Weight: 129 lb 6.4 oz (58.7 kg)  Temp (24hrs), Avg:98.8 F (37.1 C), Min:98.4 F (36.9 C), Max:99.4 F (37.4 C)  Recent Labs  Lab 10/29/19 1309 10/29/19 1715 10/29/19 2336 10/30/19 0552 10/30/19 0558  WBC 25.6*  --  25.5* 25.3*  --   CREATININE 1.68*  --   --  1.27*  --   LATICACIDVEN  --  1.4 1.2  --  1.3    Estimated Creatinine Clearance: 30.7 mL/min (A) (by C-G formula based on SCr of 1.27 mg/dL (H)).     Antimicrobials this admission:  Ceftriaxone 11/2 >  Vancomycin  11/2 >  Metronidazole  11/2 >  Dose adjustments this admission: 11/3 Vancomycin 1000mg   Q48 hr to 1250mg  Q48 hr   11/3 Vancomycin 1250mg  Q48hr   Scr used: 1.27   Est AUC: 482.2 mcg*h/mL  Microbiology results: 11/2 BCx: NGTD 11/2 UCx: pending  11/2 MRSA PCR: pending  Thank you for allowing pharmacy to be a part of this patient's care.  Benetta Spar, PharmD, BCPS, BCCP Clinical Pharmacist  Please check AMION for all [place] phone numbers After 10:00 PM, call [place] [phone]

## 2019-10-30 NOTE — Progress Notes (Signed)
Initial Nutrition Assessment  DOCUMENTATION CODES:   Not applicable  INTERVENTION:  Ensure Enlive TID each supplement provides 350 calories, 20g protein.,  Multivitamin with minerals daily.   Pro Stat 15mL BID with meals provides 100 calories and 15g protein.   Feeding Assistance with meals.   Downgrade to dysphagia 2 mechanical soft for ease of intake.     NUTRITION DIAGNOSIS:   Increased nutrient needs related to wound healing as evidenced by estimated needs.    GOAL:   Patient will meet greater than or equal to 90% of their needs    MONITOR:   PO intake, Supplement acceptance, Diet advancement, Labs, Weight trends, Skin, I & O's  REASON FOR ASSESSMENT:   Consult Wound healing  ASSESSMENT:   84 yo Female admitted for altered mental status and Osteomyelitis. PMH includes DM type II, CKD stage III, anemia, HTN, HLD, peripheral artery disease, CVA.  Patient was in a significant amount of pain because of osteomyelitis of right foot. Cried and gave short yes or no answers to questions and indicated that she wished for [place] to come back at a later time. Would not give her name and date of birth. Unable to perform NFPE as pt refused. She is refusing food and medications per the RN. Upon review of her wt history, pt has lost 6% of her body weight in the last week, which is significant for time frame. Noticed that she had poor dentition and downgraded her diet to Dysphagia 2 mechanical soft for ease of intake due to her lack of teeth. Patient would benefit from feeding assistance to promote intake. Labs and Medication reviewed.    Diet Order:   Diet Order            Diet Carb Modified Fluid consistency: Thin; Room service appropriate? Yes  Diet effective now              EDUCATION NEEDS:   Not appropriate for education at this time  Skin:  Skin Assessment: Reviewed RN Assessment  Last BM:  unknown  Height:   Ht Readings from Last 1 Encounters:  10/22/19 5\' 5"   (1.651 m)    Weight:   Wt Readings from Last 1 Encounters:  10/30/19 58.7 kg    Ideal Body Weight:  56.8 kg  BMI:  Body mass index is 21.53 kg/m.  Estimated Nutritional Needs:   Kcal:  1500-1700 cal  Protein:  70-85 g  Fluid:  1.5-1.7 L   Meda Klinefelter, Dietetic Intern

## 2019-10-31 ENCOUNTER — Ambulatory Visit: Payer: Self-pay | Admitting: *Deleted

## 2019-10-31 ENCOUNTER — Encounter (HOSPITAL_COMMUNITY)
Admission: EM | Disposition: A | Discharge status: Home Health | Payer: Self-pay | Source: Home / Self Care | Attending: Family Medicine

## 2019-10-31 ENCOUNTER — Ambulatory Visit (HOSPITAL_COMMUNITY): Admission: RE | Admit: 2019-10-31 | Payer: Medicare Other | Source: Ambulatory Visit | Admitting: Cardiovascular Disease

## 2019-10-31 ENCOUNTER — Encounter (HOSPITAL_COMMUNITY): Payer: Self-pay | Admitting: Cardiovascular Disease

## 2019-10-31 ENCOUNTER — Inpatient Hospital Stay (HOSPITAL_COMMUNITY): Payer: Medicare Other

## 2019-10-31 DIAGNOSIS — N189 Chronic kidney disease, unspecified: Secondary | ICD-10-CM

## 2019-10-31 DIAGNOSIS — I70261 Atherosclerosis of native arteries of extremities with gangrene, right leg: Secondary | ICD-10-CM

## 2019-10-31 DIAGNOSIS — I96 Gangrene, not elsewhere classified: Secondary | ICD-10-CM

## 2019-10-31 DIAGNOSIS — A419 Sepsis, unspecified organism: Principal | ICD-10-CM

## 2019-10-31 DIAGNOSIS — E119 Type 2 diabetes mellitus without complications: Secondary | ICD-10-CM

## 2019-10-31 DIAGNOSIS — I739 Peripheral vascular disease, unspecified: Secondary | ICD-10-CM

## 2019-10-31 HISTORY — PX: ABDOMINAL AORTOGRAM W/LOWER EXTREMITY: CATH118223

## 2019-10-31 LAB — CBC WITH DIFFERENTIAL/PLATELET
Abs Immature Granulocytes: 1.39 10*3/uL — ABNORMAL HIGH (ref 0.00–0.07)
Basophils Absolute: 0.1 10*3/uL (ref 0.0–0.1)
Basophils Relative: 0 %
Eosinophils Absolute: 0 10*3/uL (ref 0.0–0.5)
Eosinophils Relative: 0 %
HCT: 23.9 % — ABNORMAL LOW (ref 36.0–46.0)
Hemoglobin: 8.2 g/dL — ABNORMAL LOW (ref 12.0–15.0)
Immature Granulocytes: 5 %
Lymphocytes Relative: 5 %
Lymphs Abs: 1.4 10*3/uL (ref 0.7–4.0)
MCH: 29.3 pg (ref 26.0–34.0)
MCHC: 34.3 g/dL (ref 30.0–36.0)
MCV: 85.4 fL (ref 80.0–100.0)
Monocytes Absolute: 2.1 10*3/uL — ABNORMAL HIGH (ref 0.1–1.0)
Monocytes Relative: 8 %
Neutro Abs: 21.2 10*3/uL — ABNORMAL HIGH (ref 1.7–7.7)
Neutrophils Relative %: 82 %
Platelets: 457 10*3/uL — ABNORMAL HIGH (ref 150–400)
RBC: 2.8 MIL/uL — ABNORMAL LOW (ref 3.87–5.11)
RDW: 14.5 % (ref 11.5–15.5)
WBC: 26.1 10*3/uL — ABNORMAL HIGH (ref 4.0–10.5)
nRBC: 0 % (ref 0.0–0.2)

## 2019-10-31 LAB — COMPREHENSIVE METABOLIC PANEL
ALT: 17 U/L (ref 0–44)
AST: 25 U/L (ref 15–41)
Albumin: 1.7 g/dL — ABNORMAL LOW (ref 3.5–5.0)
Alkaline Phosphatase: 82 U/L (ref 38–126)
Anion gap: 11 (ref 5–15)
BUN: 21 mg/dL (ref 8–23)
CO2: 19 mmol/L — ABNORMAL LOW (ref 22–32)
Calcium: 10.2 mg/dL (ref 8.9–10.3)
Chloride: 102 mmol/L (ref 98–111)
Creatinine, Ser: 1.29 mg/dL — ABNORMAL HIGH (ref 0.44–1.00)
GFR calc Af Amer: 45 mL/min — ABNORMAL LOW (ref >60)
GFR calc non Af Amer: 39 mL/min — ABNORMAL LOW (ref >60)
Glucose, Bld: 124 mg/dL — ABNORMAL HIGH (ref 70–99)
Potassium: 3.3 mmol/L — ABNORMAL LOW (ref 3.5–5.1)
Sodium: 132 mmol/L — ABNORMAL LOW (ref 135–145)
Total Bilirubin: 0.7 mg/dL (ref 0.3–1.2)
Total Protein: 5.9 g/dL — ABNORMAL LOW (ref 6.5–8.1)

## 2019-10-31 LAB — TYPE AND SCREEN
ABO/RH(D): B POS
Antibody Screen: NEGATIVE
Unit division: 0

## 2019-10-31 LAB — MAGNESIUM: Magnesium: 1.8 mg/dL (ref 1.7–2.4)

## 2019-10-31 LAB — BPAM RBC
Blood Product Expiration Date: 202011272359
ISSUE DATE / TIME: 202011031127
Unit Type and Rh: 7300

## 2019-10-31 LAB — GLUCOSE, CAPILLARY
Glucose-Capillary: 62 mg/dL — ABNORMAL LOW (ref 70–99)
Glucose-Capillary: 160 mg/dL — ABNORMAL HIGH (ref 70–99)
Glucose-Capillary: 110 mg/dL — ABNORMAL HIGH (ref 70–99)
Glucose-Capillary: 65 mg/dL — ABNORMAL LOW (ref 70–99)
Glucose-Capillary: 68 mg/dL — ABNORMAL LOW (ref 70–99)
Glucose-Capillary: 169 mg/dL — ABNORMAL HIGH (ref 70–99)
Glucose-Capillary: 251 mg/dL — ABNORMAL HIGH (ref 70–99)

## 2019-10-31 SURGERY — ABDOMINAL AORTOGRAM W/LOWER EXTREMITY
Anesthesia: LOCAL

## 2019-10-31 MED ORDER — PRO-STAT SUGAR FREE PO LIQD
30.0000 mL | Freq: Two times a day (BID) | ORAL | Status: DC
  Administered 2019-10-31 – 2019-11-04 (×8): 30 mL via ORAL
  Filled 2019-10-31 (×7): qty 30

## 2019-10-31 MED ORDER — HEPARIN (PORCINE) IN NACL 1000-0.9 UT/500ML-% IV SOLN
INTRAVENOUS | Status: AC
  Filled 2019-10-31: qty 1000

## 2019-10-31 MED ORDER — HEPARIN (PORCINE) IN NACL 1000-0.9 UT/500ML-% IV SOLN
INTRAVENOUS | Status: DC | PRN
  Administered 2019-10-31 (×2): 500 mL

## 2019-10-31 MED ORDER — SODIUM CHLORIDE 0.9 % IV SOLN
INTRAVENOUS | Status: AC
  Administered 2019-10-31: 17:00:00 via INTRAVENOUS

## 2019-10-31 MED ORDER — DEXTROSE 50 % IV SOLN
INTRAVENOUS | Status: AC
  Administered 2019-10-31: 50 mL
  Filled 2019-10-31: qty 50

## 2019-10-31 MED ORDER — SODIUM CHLORIDE 0.9% FLUSH
3.0000 mL | Freq: Two times a day (BID) | INTRAVENOUS | Status: DC
  Administered 2019-11-01 – 2019-11-03 (×6): 3 mL via INTRAVENOUS

## 2019-10-31 MED ORDER — SODIUM CHLORIDE 0.9% FLUSH: 3.0000 mL | INTRAVENOUS | Status: DC | PRN

## 2019-10-31 MED ORDER — CARVEDILOL 25 MG PO TABS
25.0000 mg | ORAL_TABLET | Freq: Two times a day (BID) | ORAL | Status: DC
  Administered 2019-10-31 – 2019-11-04 (×8): 25 mg via ORAL
  Filled 2019-10-31 (×8): qty 1

## 2019-10-31 MED ORDER — POTASSIUM CHLORIDE CRYS ER 20 MEQ PO TBCR
40.0000 meq | EXTENDED_RELEASE_TABLET | Freq: Once | ORAL | Status: AC
  Administered 2019-10-31: 40 meq via ORAL
  Filled 2019-10-31: qty 2

## 2019-10-31 MED ORDER — LIDOCAINE HCL (PF) 1 % IJ SOLN
INTRAMUSCULAR | Status: DC | PRN
  Administered 2019-10-31 (×2): 15 mL via INTRADERMAL

## 2019-10-31 MED ORDER — LIDOCAINE HCL (PF) 1 % IJ SOLN
INTRAMUSCULAR | Status: AC
  Filled 2019-10-31: qty 30

## 2019-10-31 MED ORDER — SODIUM CHLORIDE 0.9 % IV SOLN
250.0000 mL | INTRAVENOUS | Status: DC | PRN
  Administered 2019-11-01: 250 mL via INTRAVENOUS

## 2019-10-31 MED ORDER — CLOPIDOGREL BISULFATE 75 MG PO TABS
75.0000 mg | ORAL_TABLET | ORAL | Status: AC
  Administered 2019-10-31: 75 mg via ORAL
  Filled 2019-10-31: qty 1

## 2019-10-31 MED ORDER — SODIUM CHLORIDE 0.9 % IV SOLN
510.0000 mg | Freq: Once | INTRAVENOUS | Status: AC
  Administered 2019-11-01: 510 mg via INTRAVENOUS
  Filled 2019-10-31: qty 17

## 2019-10-31 MED ORDER — IODIXANOL 320 MG/ML IV SOLN
INTRAVENOUS | Status: DC | PRN
  Administered 2019-10-31: 115 mL via INTRA_ARTERIAL

## 2019-10-31 SURGICAL SUPPLY — 12 items
CATH ANGIO 5F PIGTAIL 65CM (CATHETERS) ×1 IMPLANT
KIT MICROPUNCTURE NIT STIFF (SHEATH) ×2 IMPLANT
KIT PV (KITS) ×2 IMPLANT
SHEATH PINNACLE 5F 10CM (SHEATH) ×1 IMPLANT
SHEATH PROBE COVER 6X72 (BAG) ×1 IMPLANT
STOPCOCK MORSE 400PSI 3WAY (MISCELLANEOUS) ×1 IMPLANT
SYR MEDRAD MARK 7 150ML (SYRINGE) ×2 IMPLANT
TRANSDUCER W/STOPCOCK (MISCELLANEOUS) ×2 IMPLANT
TRAY PV CATH (CUSTOM PROCEDURE TRAY) ×2 IMPLANT
TUBING CIL FLEX 10 FLL-RA (TUBING) ×1 IMPLANT
WIRE BENTSON .035X145CM (WIRE) ×2 IMPLANT
WIRE TORQFLEX AUST .018X40CM (WIRE) ×1 IMPLANT

## 2019-10-31 NOTE — Progress Notes (Signed)
Site area: Right groin a 5 french arterial sheath was removed  Site Prior to Removal:  Level 0  Pressure Applied For 20 MINUTES    Bedrest Beginning at 1105am  Manual:   Yes.    Patient Status During Pull:  stable  Post Pull Groin Site:  Level 0  Post Pull Instructions Given:  Yes.    Post Pull Pulses Present:  Yes.    Dressing Applied:  Yes.    Comments:  stable

## 2019-10-31 NOTE — Progress Notes (Signed)
Progress Note  Patient Name: Monique Cabrera Date of Encounter: 10/31/2019  Primary Cardiologist:  Freeborn more alert and more oriented today. One of her three daughters Monique Cabrera) at bedside, oldest daughter Monique Cabrera) on conference call. No active CV complaints. Angiogram results and treatment options discussed w Dr. Fletcher Anon and reviewed at bedside w patient and family.  Inpatient Medications    Scheduled Meds: . amLODipine  10 mg Oral Daily  . aspirin  81 mg Oral Daily  . atorvastatin  20 mg Oral Daily  . carvedilol  25 mg Oral BID WC  . clopidogrel  75 mg Oral Daily  . feeding supplement (ENSURE ENLIVE)  237 mL Oral TID BM  . feeding supplement (PRO-STAT SUGAR FREE 64)  30 mL Oral BID WC  . insulin aspart  0-9 Units Subcutaneous TID WC  . insulin glargine  20 Units Subcutaneous Daily  . multivitamin with minerals  1 tablet Oral Daily  . mupirocin ointment  1 application Nasal BID  . sodium chloride flush  3 mL Intravenous Q12H   Continuous Infusions: . sodium chloride    . cefTRIAXone (ROCEPHIN)  IV 2 g (10/31/19 1721)  . [START ON 11/01/2019] ferumoxytol    . metronidazole 500 mg (10/31/19 1455)  . vancomycin 1,250 mg (10/31/19 0837)   PRN Meds: sodium chloride, acetaminophen **OR** acetaminophen, HYDROcodone-acetaminophen, ondansetron **OR** ondansetron (ZOFRAN) IV, polyethylene glycol, sodium chloride flush   Vital Signs    Vitals:   10/31/19 1400 10/31/19 1430 10/31/19 1618 10/31/19 1935  BP: 115/62 108/60 110/76 (!) 130/57  Pulse: 71 75 74 80  Resp:   16 16  Temp:   98.6 F (37 C) 99.9 F (37.7 C)  TempSrc:   Oral Oral  SpO2:   100% 95%  Weight:        Intake/Output Summary (Last 24 hours) at 10/31/2019 2041 Last data filed at 10/31/2019 1837 Gross per 24 hour  Intake 1101.39 ml  Output 200 ml  Net 901.39 ml   Last 3 Weights 10/31/2019 10/30/2019 10/22/2019  Weight (lbs) 132 lb 11.5 oz 129 lb 6.4 oz 137 lb  Weight (kg) 60.2 kg 58.695 kg  62.143 kg      Telemetry    NSR - Personally Reviewed  ECG    No new tracing - Personally Reviewed  Physical Exam  Comfortable lying flat GEN: No acute distress.   Neck: No JVD Cardiac: RRR, no murmurs, rubs, or gallops.  Respiratory: Clear to auscultation bilaterally. GI: Soft, nontender, non-distended  MS: extensive gangrene/skin necrosis R 3rd and 4th toes and anterior foot Neuro:  Nonfocal  Psych: Normal affect   Labs    High Sensitivity Troponin:  No results for input(s): TROPONINIHS in the last 720 hours.    Chemistry Recent Labs  Lab 10/29/19 1309 10/30/19 0552 10/31/19 0404  NA 125* 129* 132*  K 4.2 3.6 3.3*  CL 92* 99 102  CO2 19* 20* 19*  GLUCOSE 328* 78 124*  BUN 31* 25* 21  CREATININE 1.68* 1.27* 1.29*  CALCIUM 10.2 10.5* 10.2  PROT 6.3*  --  5.9*  ALBUMIN 1.9*  --  1.7*  AST 26  --  25  ALT 19  --  17  ALKPHOS 68  --  82  BILITOT 1.1  --  0.7  GFRNONAA 28* 39* 39*  GFRAA 32* 46* 45*  ANIONGAP 14 10 11      Hematology Recent Labs  Lab 10/29/19 2336 10/30/19 0552 10/31/19  0404  WBC 25.5* 25.3* 26.1*  RBC 2.34* 2.28* 2.80*  HGB 6.8* 6.6* 8.2*  HCT 20.6* 19.7* 23.9*  MCV 88.0 86.4 85.4  MCH 29.1 28.9 29.3  MCHC 33.0 33.5 34.3  RDW 13.5 13.4 14.5  PLT 491* 511* 457*    BNPNo results for input(s): BNP, PROBNP in the last 168 hours.   DDimer No results for input(s): DDIMER in the last 168 hours.   Radiology    Vas [place] Lower Extremity Saphenous Vein Mapping  Result Date: 10/31/2019 LOWER EXTREMITY VEIN MAPPING Indications: pre op bypass  Performing Technologist: Monique Cabrera RDMS, RVT  Examination Guidelines: A complete evaluation includes B-mode imaging, spectral Doppler, color Doppler, and power Doppler as needed of all accessible portions of each vessel. Bilateral testing is considered an integral part of a complete examination. Limited examinations for reoccurring indications may be performed as noted.  +--------------+-------------+--------------------+-------------+--------------+  RT Diameter   RT Findings         GSV          LT Diameter  LT Findings        (cm)                                          (cm)                    +--------------+-------------+--------------------+-------------+--------------+      0.30                     Saphenofemoral       0.35                                                     Junction                                  +--------------+-------------+--------------------+-------------+--------------+      0.25       branching     Proximal thigh       0.18                    +--------------+-------------+--------------------+-------------+--------------+                    not          Mid thigh          0.17                                   visualized                                                  +--------------+-------------+--------------------+-------------+--------------+                    not         Distal thigh                 not visualized  visualized                                                  +--------------+-------------+--------------------+-------------+--------------+                    not             Knee            0.12                                   visualized                                                  +--------------+-------------+--------------------+-------------+--------------+      0.13                       Prox calf          0.13       branching    +--------------+-------------+--------------------+-------------+--------------+      0.12                        Mid calf          0.12                    +--------------+-------------+--------------------+-------------+--------------+ Diagnosing physician: Curt Jews MD Electronically signed by Curt Jews MD on 10/31/2019 at 6:22:37 PM.    Final     Cardiac Studies  Doppler 10/16/2019  Calcific  atherosclerosis throughout the aorta and bilateral lower extremity arteries.  Right: Total occlusion noted in the superficial femoral artery. Total occlusion noted in the posterior tibial artery. Total occlusion noted in the peroneal artery.  Left: Total occlusion noted in the superficial femoral artery. Total occlusion noted in the superficial femoral artery and/or popliteal artery. Total occlusion noted in the posterior tibial artery. Suspected occlusion of the left common iliac artery;  unable to detect flow within the vessel and blunted monophasic waveforms in the external iliac and common femoral arteries.   Aortogram/runoff 10/31/2019 1.  No significant aortoiliac disease. 2.  Right lower extremity: Long occlusion of the SFA and proximal popliteal artery with reconstitution via collaterals in the mid popliteal artery with two-vessel runoff below the knee. 3.  Left lower extremity: Subtotal heavily calcified disease in the common femoral artery with long occlusion of the SFA and proximal popliteal artery with reconstitution via collaterals in the mid popliteal artery with one-vessel runoff below the knee via the anterior tibial artery.  Recommendations: I think the best option is right femoropopliteal bypass.  The patient also has severe disease on the left side and at some point might require common femoral endarterectomy and femoropopliteal bypass as well.  However, the right leg is the priority right now.  Patient Profile     84 y.o. female with severe PAD and gangrene/osteomyelitis R foot.  Assessment & Plan    1. PAD: severe bilateral lower extremity arterial disease. Bilateral lengthy SFA occlusion. Dr. Donnetta Hutching has evaluated and discussed R BKA. On dual antiplatelet therapy and statin. 2. HLP: LDL 60 earlier  this year. 3. Ac on CKD: back to baseline, stable. 4. Sepsis: WBC remains markedly elevated, but appears less confused and hemodynamically compensated. Unlikely to resolve  without gangrene resolution/amputation. 5. DM:  Poor chronic control. Possible recent hypoglycemia episodes related to infection. 6. HTN:  BP normal without meds, likely lower due to infection.     For questions or updates, please contact [place] Please consult [email or web address] for contact info under        Signed, Sanda Klein, MD  10/31/2019, 8:41 PM

## 2019-10-31 NOTE — Interval H&P Note (Signed)
History and Physical Interval Note:  10/31/2019 10:33 AM  Monique Cabrera  has presented today for surgery, with the diagnosis of pad.  The various methods of treatment have been discussed with the patient and family. After consideration of risks, benefits and other options for treatment, the patient has consented to  Procedure(s): ABDOMINAL AORTOGRAM W/LOWER EXTREMITY (N/A) as a surgical intervention.  The patient's history has been reviewed, patient examined, no change in status, stable for surgery.  I have reviewed the patient's chart and labs.  Questions were answered to the patient's satisfaction.     Kathlyn Sacramento

## 2019-10-31 NOTE — Consult Note (Addendum)
Hospital Consult    Reason for Consult:  PAD Requesting Physician:  Fletcher Anon MRN #:  [id number]  History of Present Illness: This is a 84 y.o. female pt of Dr. Fletcher Anon who has PAD and an ischemic ulcer on the right foot.  She had ABI's, which revealed non-compressible vessels and toe pressure was absent.   She underwent arteriogram today, which revealed the following: 1.  No significant aortoiliac disease. 2.  Right lower extremity: Long occlusion of the SFA and proximal popliteal artery with reconstitution via collaterals in the mid popliteal artery with two-vessel runoff below the knee. 3.  Left lower extremity: Subtotal heavily calcified disease in the common femoral artery with long occlusion of the SFA and proximal popliteal artery with reconstitution via collaterals in the mid popliteal artery with one-vessel runoff below the knee via the anterior tibial artery.  Dr. Fletcher Anon felt the pt's best option would be right femoral popliteal bypass graft.  She also has severe dz on the left and at some point might require femoral endarterectomy and femoral popliteal bypass as well but right leg is priority at this time.    She has about a one month hx of ulceration of the toes on the right foot and this has been worsening.    She states that earlier this year, she broke her left leg and continued to walk on it and it was too late for surgical repair once realized it was broken.  She states that prior to that, she did get cramping in her left leg when she would walk about a city block.  She would rest, the pain would resolve and she would continue.  She has not had claudication in the right leg.   She does not have any wound on the left foot.  She states she has had fever and chills on and off since her foot wounds present.  Her covid test was negative.   The pt is on a statin for cholesterol management.  The pt is on a daily aspirin.   Other AC:  Plavix The pt is on BB, CCB, ARB for hypertension.    The pt is diabetic.   Tobacco hx:  remote  Past Medical History:  Diagnosis Date  . Acute upper respiratory infections of unspecified site   . Anemia   . Anemia, unspecified   . Atherosclerosis of native arteries of the extremities, unspecified   . Chest pain, unspecified   . Chronic kidney disease (CKD), stage II (mild)   . Diabetes mellitus   . Diarrhea   . Disorder of bone and cartilage, unspecified   . DM (diabetes mellitus) type II controlled with renal manifestation ([place])   . Herpes zoster with other nervous system complications(053.19)   . Hypercalcemia   . Hypertension   . Hypertensive renal disease, benign   . Nonspecific reaction to tuberculin skin test without active tuberculosis(795.51)   . Nonspecific tuberculin test reaction   . Other and unspecified hyperlipidemia   . Pain in joint, lower leg   . Peripheral arterial disease ([place])   . Postherpetic neuralgia   . Proteinuria   . Stroke ([place]) 01/2017  . Type II or unspecified type diabetes mellitus with renal manifestations, not stated as uncontrolled(250.40)   . Type II or unspecified type diabetes mellitus with renal manifestations, uncontrolled(250.42)   . Unspecified disorder of kidney and ureter   . Unspecified essential hypertension     Past Surgical History:  Procedure Laterality Date  . ABDOMINAL AORTOGRAM  W/LOWER EXTREMITY N/A 10/31/2019   Procedure: ABDOMINAL AORTOGRAM W/LOWER EXTREMITY;  Surgeon: Wellington Hampshire, MD;  Location: [facility];  Service: Cardiovascular;  Laterality: N/A;  . hysterectomy    . INCISION AND DRAINAGE Left 05/27/14   sebacous cyst, ear  . PRP Left    Dr. Ricki Miller  . removal of cyst from hand    . removal of tumor from foot    . TONSILLECTOMY      Allergies  Allergen Reactions  . Invokana [Canagliflozin] Other (See Comments)    Vagina itching and swelling Vaginal Itching and irritation   . Jardiance [Empagliflozin] Itching and Other (See Comments)    Vaginal  itching and swelling    Prior to Admission medications   Medication Sig Start Date End Date Taking? Authorizing Provider  amLODipine (NORVASC) 10 MG tablet TAKE 1 TABLET (10 MG TOTAL) BY MOUTH DAILY. FOR HIGH BLOOD PRESSURE 06/13/19  Yes Reed, Tiffany L, DO  atorvastatin (LIPITOR) 20 MG tablet TAKE 1 TABLET BY MOUTH EVERY DAY Patient taking differently: Take 20 mg by mouth daily.  06/11/19   Despina Hick, MD  carvedilol (COREG) 25 MG tablet TAKE 1 TABLET BY MOUTH TWICE A DAY WITH A MEAL Patient taking differently: Take 25 mg by mouth 2 (two) times daily with a meal.  12/26/18   Reed, Tiffany L, DO  clopidogrel (PLAVIX) 75 MG tablet TAKE 1 TABLET BY MOUTH EVERY DAY 10/25/19   Reed, Tiffany L, DO  doxycycline (VIBRA-TABS) 100 MG tablet Take 100 mg by mouth 2 (two) times daily.    [provider]  HYDROcodone-acetaminophen (NORCO) 5-325 MG tablet Take 1 tablet by mouth every 6 (six) hours as needed for severe pain. 10/24/19   Reed, Tiffany L, DO  Insulin Glargine (LANTUS) 100 UNIT/ML Solostar Pen INJECT 48 UNITS UNDER THE SKIN DAILY FOR DIABETES Patient taking differently: Inject 48 Units into the skin daily.  10/09/19   Ngetich, [name] C, NP  insulin lispro (HUMALOG KWIKPEN) 100 UNIT/ML KwikPen Inject 0.03 mLs (3 Units total) into the skin 3 (three) times daily. 10/18/19   Reed, Tiffany L, DO  Insulin Pen Needle (B-D ULTRAFINE III SHORT PEN) 31G X 8 MM MISC Use to check blood sugar every day. Dx: 11.29; 11.65 07/02/19   Reed, Tiffany L, DO  ONETOUCH VERIO test strip USE TO TEST BLOOD SUGAR THREE TIMES DAILY. DX: E11.9 10/08/19   Reed, Tiffany L, DO  potassium chloride SA (KLOR-CON) 20 MEQ tablet Take 1 tablet (20 mEq total) by mouth 2 (two) times daily for 3 days. 10/11/19 10/22/19  Sherwood Gambler, MD  valsartan-hydrochlorothiazide (DIOVAN-HCT) 320-25 MG tablet TAKE 1 TABLET BY MOUTH EVERY DAY 10/25/19   [name], DO    Social History   Socioeconomic History  . Marital status:  Widowed    Spouse name: Not on file  . Number of children: 6  . Years of education: 38  . Highest education level: Not on file  Occupational History    Comment: retired, [employer]  Social Needs  . Financial resource strain: Not hard at all  . Food insecurity    Worry: Never true    Inability: Never true  . Transportation needs    Medical: Yes    Non-medical: Yes  Tobacco Use  . Smoking status: Former Smoker    Types: Cigarettes  . Smokeless tobacco: Never Used  . Tobacco comment: Quit about age 8   Substance and Sexual Activity  . Alcohol use:  No    Alcohol/week: 0.0 standard drinks  . Drug use: No  . Sexual activity: Never  Lifestyle  . Physical activity    Days per week: 0 days    Minutes per session: 0 min  . Stress: Not at all  Relationships  . Social connections    Talks on phone: More than three times a week    Gets together: Twice a week    Attends religious service: 1 to 4 times per year    Active member of club or organization: No    Attends meetings of clubs or organizations: Never    Relationship status: Widowed  . Intimate partner violence    Fear of current or ex partner: No    Emotionally abused: No    Physically abused: No    Forced sexual activity: No  Other Topics Concern  . Not on file  Social History Narrative   Lives alone, son there occass   Caffeine- coffee 2-3 cups daily, soda off and on     Family History  Problem Relation Age of Onset  . Diabetes Mother   . Diabetes Father   . Diabetes Sister   . Diabetes Sister     ROS: [x]  Positive   [ ]  Negative   [ ]  All sytems reviewed and are negative  Cardiac: [x]  high blood pressure  Vascular: [x]  pain in legs while walking-right leg [x]  non-healing ulcers []  swelling in legs  Pulmonary: []  home O2  Neurologic: [x]  hx of CVA  Hematologic: [x]  hx of anemia  Endocrine:   [x]  diabetes []  thyroid disease  GI []  vomiting blood []  blood in stool  GU: [x]  CKD  II  Psychiatric: []  anxiety []  depression  Musculoskeletal: [x]  broken left leg earlier this year  Integumentary: []  rashes [x]  ulcers  Constitutional: [x]  fever [x]  chills   Physical Examination  Vitals:   10/31/19 1430 10/31/19 1618  BP: 108/60 110/76  Pulse: 75 74  Resp:  16  Temp:  98.6 F (37 C)  SpO2:  100%   Body mass index is 22.09 kg/m.  General:  WDWN in NAD Gait: Not observed HENT: WNL, normocephalic Pulmonary: normal non-labored breathing, without Rales, rhonchi,  wheezing Cardiac: regular, without  Murmurs, rubs or gallops; without carotid bruits Abdomen:  soft, NT/ND, no masses Skin: without rashes Vascular Exam/Pulses:  Right Left  Radial 1+ (weak) 1+ (weak)  Femoral Right groin with bandage from previous cath 1+ (weak)  DP Unable to palpate  Unable to palpate   PT Unable to palpate  Unable to palpate    Extremities: right foot wrapped; malodorous.     Musculoskeletal: no muscle wasting or atrophy  Neurologic: A&O X 3;  No focal weakness or paresthesias are detected; speech is fluent/normal Psychiatric:  The pt has Normal affect.   CBC    Component Value Date/Time   WBC 26.1 (H) 10/31/2019 0404   RBC 2.80 (L) 10/31/2019 0404   HGB 8.2 (L) 10/31/2019 0404   HGB 8.6 (L) 10/22/2019 1056   HCT 23.9 (L) 10/31/2019 0404   HCT 26.7 (L) 10/22/2019 1056   PLT 457 (H) 10/31/2019 0404   PLT 508 (H) 10/22/2019 1056   MCV 85.4 10/31/2019 0404   MCV 91 10/22/2019 1056   MCH 29.3 10/31/2019 0404   MCHC 34.3 10/31/2019 0404   RDW 14.5 10/31/2019 0404   RDW 12.0 10/22/2019 1056   LYMPHSABS 1.4 10/31/2019 0404   LYMPHSABS 1.6 10/22/2019 1056   MONOABS  2.1 (H) 10/31/2019 0404   EOSABS 0.0 10/31/2019 0404   EOSABS 0.1 10/22/2019 1056   BASOSABS 0.1 10/31/2019 0404   BASOSABS 0.1 10/22/2019 1056    BMET    Component Value Date/Time   NA 132 (L) 10/31/2019 0404   NA 128 (L) 10/22/2019 1056   K 3.3 (L) 10/31/2019 0404   CL 102  10/31/2019 0404   CO2 19 (L) 10/31/2019 0404   GLUCOSE 124 (H) 10/31/2019 0404   BUN 21 10/31/2019 0404   BUN 17 10/22/2019 1056   CREATININE 1.29 (H) 10/31/2019 0404   CREATININE 1.71 (H) 09/17/2019 1518   CALCIUM 10.2 10/31/2019 0404   GFRNONAA 39 (L) 10/31/2019 0404   GFRNONAA 27 (L) 09/17/2019 1518   GFRAA 45 (L) 10/31/2019 0404   GFRAA 32 (L) 09/17/2019 1518    COAGS: Lab Results  Component Value Date   INR 1.3 (H) 10/29/2019     Non-Invasive Vascular Imaging:   Vein mapping 10/31/2019: +--------------+-------------+--------------------+-------------+--------------+  RT Diameter   RT Findings         GSV          LT Diameter  LT Findings        (cm)                                          (cm)                    +--------------+-------------+--------------------+-------------+--------------+      0.30                     Saphenofemoral       0.35                                                     Junction                                  +--------------+-------------+--------------------+-------------+--------------+      0.25       branching     Proximal thigh       0.18                    +--------------+-------------+--------------------+-------------+--------------+                    not          Mid thigh          0.17                                   visualized                                                  +--------------+-------------+--------------------+-------------+--------------+                    not         Distal thigh  not visualized                visualized                                                  +--------------+-------------+--------------------+-------------+--------------+                    not             Knee            0.12                                   visualized                                                   +--------------+-------------+--------------------+-------------+--------------+      0.13                       Prox calf          0.13       branching    +--------------+-------------+--------------------+-------------+--------------+      0.12                        Mid calf          0.12                    +--------------+-------------+--------------------+-------------+--------------+     ASSESSMENT/PLAN: This is a 84 y.o. female with non healing right foot ulcer in presence of PAD  -pt with non healing wound and long occlusion of the right SFA with reconstitution vial collaterals in the mid popliteal artery with one vessel runoff via the ATA.  Pt will need right femoral to popliteal bypass and will plan for this on Friday.  Vein mapping is not optimistic for saphenous vein, but will look again in OR to see if vein is adequate.   -pt does have leukocytosis of 26k, most likely due to her right foot wound.  -Dr. Donnetta Hutching will be by to evaluate pt this evening.     Leontine Locket, PA-C [facility] [phone]   I have examined the patient, reviewed and agree with above.  Reviewed arteriogram from earlier today.  Occlusion of her superficial femoral artery at its origin extending down to her popliteal with reconstitution of the below-knee popliteal artery and two-vessel runoff.  On physical exam her right foot has extensive gangrene.  She has full-thickness loss of her second third and fourth toes extending up onto the dorsum of her foot and including her lateral malleolus.  She has epidermal lysis extending even above her ankle.  The necrosis is into the subcutaneous tissue by physical exam at the level of the ankle.  I discussed these findings with the patient and her daughter present.  I explained that her foot is nonviable.  There is no way she could have a partial foot amputation due to the extent of her necrosis.  Unfortunately her only option is a  higher level of amputation.  I explained that this could be  either a below-knee or above-knee amputation.  With her superficial femoral artery occlusion she is certainly borderline at being able to heal a below-knee amputation.  She was independent prior to admission.  Explained that she would have a much higher chance of returning to some level of independence with a below-knee versus above-knee amputation.  Explained that she would have a much higher degree of healing ability with above-knee versus below-knee but much more difficult time with rehabilitation.  I do feel that she has a greater than 50% chance of healing a below-knee amputation.  She is quite upset at this discussion is understandable.  Will tentatively plan for amputation on Friday if she is agreeable.  Curt Jews, MD 10/31/2019 6:34 PM

## 2019-10-31 NOTE — Progress Notes (Signed)
Hypoglycemic Event  CBG: 62  Treatment: D50 50 mL (25 gm)  Symptoms: None  Follow-up CBG: Time:0234 CBG Result:160  Possible Reasons for Event: Inadequate meal intake   Monique Cabrera

## 2019-10-31 NOTE — Progress Notes (Signed)
LE vein mapping       has been completed. Preliminary results can be found under CV proc through chart review. Miklo Aken, BS, RDMS, RVT   

## 2019-10-31 NOTE — Progress Notes (Addendum)
Nutrition Follow-up  DOCUMENTATION CODES:   Not applicable  INTERVENTION:  D/C'd Prostat  D/C'd Feeding assistance  Magic Cup TID with meals provides 250 calories and 9g protein  Continue Ensure Enlive TID provides 350 calories and 20g protein  Continue Carb Modified Diet       NUTRITION DIAGNOSIS:   Increased nutrient needs related to wound healing as evidenced by estimated needs.   GOAL:   Patient will meet greater than or equal to 90% of their needs   MONITOR:   PO intake, Supplement acceptance, Diet advancement, Labs, Weight trends, Skin, I & O's  REASON FOR ASSESSMENT:   Consult Wound healing  ASSESSMENT:   84 yo Female admitted for altered mental status and Osteomyelitis. PMH includes DM type II, CKD stage III, anemia, HTN, HLD, peripheral artery disease, CVA.  Pt was feeling much better today. She was alert, oriented and willing to answer questions. Stated that she was living with her son PTA and he cooked her three meals per day as she was unable to get up due to her foot pain. She reports only being able to consume about 10-20% of her meals before she loses her appetite. She has support from the neighbors who live around her who come and bring her food and help her care for her home. Pt. States that her appetite has been waning over the past few months. She states that she has an appetite before she gets her meals and once she begins to eat, she quickly loses her appetite and food no longer sounds or tastes any good to her. She reports liking the ensure and had finished the one in her room prior to our assessment. Pt. reports approximately 10# weight loss in the past 3 months which is consistent with weight history. Pt expressed interest in the Magic cup and was educated on the importance of increasing calorie and protein intake to promote wound healing. Noted moderate muscle wasting in her anterior thigh, patellar and posterior calf regions on both legs. This is  likely due to her not being ambulatory because of her foot pain and not attributable to malnutrition.  NUTRITION - FOCUSED PHYSICAL EXAM:    Most Recent Value  Orbital Region  No depletion  Upper Arm Region  No depletion  Thoracic and Lumbar Region  Unable to assess  Buccal Region  No depletion  Temple Region  No depletion  Clavicle Bone Region  No depletion  Clavicle and Acromion Bone Region  No depletion  Scapular Bone Region  No depletion  Dorsal Hand  No depletion  Patellar Region  Moderate depletion  Anterior Thigh Region  Moderate depletion  Posterior Calf Region  Moderate depletion  Edema (RD Assessment)  None  Hair  Reviewed  Eyes  Reviewed  Mouth  Reviewed  Skin  Reviewed  Nails  Reviewed       Diet Order:   Diet Order            Diet Carb Modified Fluid consistency: Thin; Room service appropriate? Yes  Diet effective now              EDUCATION NEEDS:   Education needs have been addressed  Skin:  Skin Assessment: Skin Integrity Issues: Skin Integrity Issues:: Diabetic Ulcer Diabetic Ulcer: rt foot  Last BM:  unknown  Height:   Ht Readings from Last 1 Encounters:  10/22/19 5\' 5"  (1.651 m)    Weight:   Wt Readings from Last 1 Encounters:  10/31/19 60.2 kg  Ideal Body Weight:  56.8 kg  BMI:  Body mass index is 22.09 kg/m.  Estimated Nutritional Needs:   Kcal:  1500-1700 cal  Protein:  70-85 g  Fluid:  1.5-1.7 L   Meda Klinefelter, Dietetic Intern

## 2019-10-31 NOTE — Progress Notes (Signed)
Patient had an incontinent episode of urine in the bed and refused for staff to clean her up. Patient is asked multiple times and is increasingly becoming agitated. Patient states "Leave me alone!". Patient is alert and oriented to self. Patient is also going for an Aortogram this am and refused for staff to clip groin as well as completion of EKG this am. Patient did allow this RN to administer PO Aspirin and Plavix. RN will inform Public house manager.  Ermalinda Memos, RN

## 2019-10-31 NOTE — Progress Notes (Signed)
Hospitalist progress note  If 7PM-7AM,  night-coverage-look on AMION -prefer pages-not epic chat,please  Monique Cabrera  EPP:[id number] DOB: July 10, 1935 DOA: 10/29/2019 PCP: [name], DO  Narrative:  84 y/o home dwelling [place] fem htn, HLD, PAD, DM ty ii, CVA on plavix, CKD iii, EF 60-65% 2/20187 Prior CVA 01/2017 c L Internuclear ophthalmoplegia Admitted 10/29/19 with some slurred speech despite low CBG and felt could have been related to CVa so was brought to the ED for work-up\ Cardiolgy consulted 2/2 Crtiical ischemia of RLE-started on IV Vanc and ceftriaxone Assessment & Plan:  Critical R foot ischemia with osteomyelitis-cont vanc flagyll ceftriaxone-for Aortogram 11/4-NPO except plavix ASA  --saline 100 cc/h pre-cath DM ty ii-a1c this admit 8.9-CBG  62-124--monitor trends on SSI-home meds Lantus 48--> 20 in attendant procedure htn-continue coreg 25 bid, amlodipine 10 daily Mild hypokalemia-replaced with Kdur 40 x 1, magnesium given yesterday-recheck and replace labs in the am hyponatremia-continue saline for now-labs in the am  Anemia 2/2 CKD-given 1 U PRBC 11/3-iron level low at 14 and saturations borderline--post procedure will give some IV iron-Ferritin elevated liekly 2/2 infection ckd iiib with mild met acidosis-stopped this admit Diovan-check am labs again in am Probable multi-infarct dementia and some behavioural issues-at times confused not aware of plan-re-orient-  DVT prophylaxis: lovenox   Code Status:   full    Family Communication:   none  Disposition Plan: await Arotogram  Consultants:   Cardiology  Procedures:  Foot xray 11/2 1. Right foot infection with soft tissue swelling and soft tissue air along the dorsal lateral aspect of the foot as described. 2. Possible small focus of osteomyelitis along the plantar lateral aspect of the cuboid with another possible area of osteomyelitis along medial base of the proximal phalanx of the fifth toe. No other evidence of  osteomyelitis.  Other: None.  CT head 11/2 1.  No acute intracranial findings.  2.  Chronic microvascular ischemic change and cerebral volume loss.  Antimicrobials:   As above  Subjective:  Confused at times-cannot orient to proceudre-events overnight noted and patient confused about procedure--but knows "we are doing something for this Objective: Vitals:   10/30/19 1745 10/31/19 0309 10/31/19 0530 10/31/19 0734  BP: (!) 120/53  139/83 (!) 140/47  Pulse: 63  83 76  Resp: _0 Temp: 98.1 F (36.7 C)  98.7 F (37.1 C) 99.7 F (37.6 C)  TempSrc: Oral  Oral Oral  SpO2: 100%  100% 96%  Weight:  60.2 kg      Intake/Output Summary (Last 24 hours) at 10/31/2019 0832 Last data filed at 10/31/2019 6063 Gross per 24 hour  Intake 1749.81 ml  Output 0 ml  Net 1749.81 ml   Filed Weights   10/30/19 0451 10/31/19 0309  Weight: 58.7 kg 60.2 kg    Examination: eomi ncat s1 s 2no m/r/g cta b no added sound abd soft nt nd no rebound Foot as below     Data Reviewed: I have personally reviewed following labs and imaging studies CBC: Recent Labs  Lab 10/29/19 1309 10/29/19 2336 10/30/19 0552 10/31/19 0404  WBC 25.6* 25.5* 25.3* 26.1*  NEUTROABS  --   --   --  21.2*  HGB 7.5* 6.8* 6.6* 8.2*  HCT 23.6* 20.6* 19.7* 23.9*  MCV 94.0 88.0 86.4 85.4  PLT 454* 491* 511* 016*   Basic Metabolic Panel: Recent Labs  Lab 10/29/19 1309 10/29/19 2336 10/30/19 0552 10/31/19 0404  NA 125*  --  129* 132*  K 4.2  --  3.6 3.3*  CL 92*  --  99 102  CO2 19*  --  20* 19*  GLUCOSE 328*  --  78 124*  BUN 31*  --  25* 21  CREATININE 1.68*  --  1.27* 1.29*  CALCIUM 10.2  --  10.5* 10.2  MG  --  1.6*  --  1.8  PHOS  --  2.1*  --   --    GFR: Estimated Creatinine Clearance: 30.3 mL/min (A) (by C-G formula based on SCr of 1.29 mg/dL (H)). Liver Function Tests: Recent Labs  Lab 10/29/19 1309 10/31/19 0404  AST 26 25  ALT 19 17  ALKPHOS 68 82  BILITOT 1.1 0.7  PROT 6.3*  5.9*  ALBUMIN 1.9* 1.7*   No results for input(s): LIPASE, AMYLASE in the last 168 hours. No results for input(s): AMMONIA in the last 168 hours. Coagulation Profile: Recent Labs  Lab 10/29/19 2336  INR 1.3*   Cardiac Enzymes: Radiology Studies: Reviewed images personally in health database  Scheduled Meds: . amLODipine  10 mg Oral Daily  . aspirin  81 mg Oral Daily  . atorvastatin  20 mg Oral Daily  . carvedilol  25 mg Oral BID WC  . clopidogrel  75 mg Oral Daily  . feeding supplement (ENSURE ENLIVE)  237 mL Oral TID BM  . feeding supplement (PRO-STAT SUGAR FREE 64)  30 mL Oral BID WC  . insulin aspart  0-9 Units Subcutaneous TID WC  . insulin glargine  20 Units Subcutaneous Daily  . multivitamin with minerals  1 tablet Oral Daily  . mupirocin ointment  1 application Nasal BID  . potassium chloride  40 mEq Oral Once  . sodium chloride flush  3 mL Intravenous Q12H   Continuous Infusions: . sodium chloride    . sodium chloride 100 mL/hr at 10/31/19 0136  . cefTRIAXone (ROCEPHIN)  IV Stopped (10/30/19 1820)  . metronidazole 500 mg (10/31/19 0519)  . vancomycin      LOS: 2 days   Time spent: [place], MD [facility]  10/31/2019, 8:32 AM

## 2019-11-01 ENCOUNTER — Ambulatory Visit: Payer: Self-pay

## 2019-11-01 ENCOUNTER — Ambulatory Visit: Payer: Medicare Other | Admitting: Internal Medicine

## 2019-11-01 ENCOUNTER — Encounter (HOSPITAL_COMMUNITY): Payer: Self-pay | Admitting: *Deleted

## 2019-11-01 DIAGNOSIS — I70219 Atherosclerosis of native arteries of extremities with intermittent claudication, unspecified extremity: Secondary | ICD-10-CM | POA: Diagnosis not present

## 2019-11-01 DIAGNOSIS — E1122 Type 2 diabetes mellitus with diabetic chronic kidney disease: Secondary | ICD-10-CM | POA: Diagnosis not present

## 2019-11-01 DIAGNOSIS — H8110 Benign paroxysmal vertigo, unspecified ear: Secondary | ICD-10-CM | POA: Diagnosis not present

## 2019-11-01 DIAGNOSIS — E113593 Type 2 diabetes mellitus with proliferative diabetic retinopathy without macular edema, bilateral: Secondary | ICD-10-CM | POA: Diagnosis not present

## 2019-11-01 DIAGNOSIS — I129 Hypertensive chronic kidney disease with stage 1 through stage 4 chronic kidney disease, or unspecified chronic kidney disease: Secondary | ICD-10-CM | POA: Diagnosis not present

## 2019-11-01 DIAGNOSIS — E785 Hyperlipidemia, unspecified: Secondary | ICD-10-CM | POA: Diagnosis not present

## 2019-11-01 DIAGNOSIS — N182 Chronic kidney disease, stage 2 (mild): Secondary | ICD-10-CM | POA: Diagnosis not present

## 2019-11-01 DIAGNOSIS — Z4800 Encounter for change or removal of nonsurgical wound dressing: Secondary | ICD-10-CM | POA: Diagnosis not present

## 2019-11-01 DIAGNOSIS — E1151 Type 2 diabetes mellitus with diabetic peripheral angiopathy without gangrene: Secondary | ICD-10-CM | POA: Diagnosis not present

## 2019-11-01 DIAGNOSIS — L97512 Non-pressure chronic ulcer of other part of right foot with fat layer exposed: Secondary | ICD-10-CM | POA: Diagnosis not present

## 2019-11-01 DIAGNOSIS — E11621 Type 2 diabetes mellitus with foot ulcer: Secondary | ICD-10-CM | POA: Diagnosis not present

## 2019-11-01 DIAGNOSIS — D631 Anemia in chronic kidney disease: Secondary | ICD-10-CM | POA: Diagnosis not present

## 2019-11-01 LAB — CBC WITH DIFFERENTIAL/PLATELET
Abs Immature Granulocytes: 1.11 10*3/uL — ABNORMAL HIGH (ref 0.00–0.07)
Basophils Absolute: 0.1 10*3/uL (ref 0.0–0.1)
Basophils Relative: 0 %
Eosinophils Absolute: 0 10*3/uL (ref 0.0–0.5)
Eosinophils Relative: 0 %
HCT: 21.7 % — ABNORMAL LOW (ref 36.0–46.0)
Hemoglobin: 7.3 g/dL — ABNORMAL LOW (ref 12.0–15.0)
Immature Granulocytes: 5 %
Lymphocytes Relative: 6 %
Lymphs Abs: 1.4 10*3/uL (ref 0.7–4.0)
MCH: 28.6 pg (ref 26.0–34.0)
MCHC: 33.6 g/dL (ref 30.0–36.0)
MCV: 85.1 fL (ref 80.0–100.0)
Monocytes Absolute: 1.9 10*3/uL — ABNORMAL HIGH (ref 0.1–1.0)
Monocytes Relative: 8 %
Neutro Abs: 18.7 10*3/uL — ABNORMAL HIGH (ref 1.7–7.7)
Neutrophils Relative %: 81 %
Platelets: 407 10*3/uL — ABNORMAL HIGH (ref 150–400)
RBC: 2.55 MIL/uL — ABNORMAL LOW (ref 3.87–5.11)
RDW: 14.6 % (ref 11.5–15.5)
WBC: 23.1 10*3/uL — ABNORMAL HIGH (ref 4.0–10.5)
nRBC: 0 % (ref 0.0–0.2)

## 2019-11-01 LAB — RENAL FUNCTION PANEL
Albumin: 1.5 g/dL — ABNORMAL LOW (ref 3.5–5.0)
Anion gap: 10 (ref 5–15)
BUN: 24 mg/dL — ABNORMAL HIGH (ref 8–23)
CO2: 19 mmol/L — ABNORMAL LOW (ref 22–32)
Calcium: 9.9 mg/dL (ref 8.9–10.3)
Chloride: 103 mmol/L (ref 98–111)
Creatinine, Ser: 1.37 mg/dL — ABNORMAL HIGH (ref 0.44–1.00)
GFR calc Af Amer: 42 mL/min — ABNORMAL LOW (ref >60)
GFR calc non Af Amer: 36 mL/min — ABNORMAL LOW (ref >60)
Glucose, Bld: 219 mg/dL — ABNORMAL HIGH (ref 70–99)
Phosphorus: 1.8 mg/dL — ABNORMAL LOW (ref 2.5–4.6)
Potassium: 3.9 mmol/L (ref 3.5–5.1)
Sodium: 132 mmol/L — ABNORMAL LOW (ref 135–145)

## 2019-11-01 LAB — GLUCOSE, CAPILLARY
Glucose-Capillary: 195 mg/dL — ABNORMAL HIGH (ref 70–99)
Glucose-Capillary: 304 mg/dL — ABNORMAL HIGH (ref 70–99)
Glucose-Capillary: 280 mg/dL — ABNORMAL HIGH (ref 70–99)

## 2019-11-01 LAB — MAGNESIUM: Magnesium: 1.7 mg/dL (ref 1.7–2.4)

## 2019-11-01 MED ORDER — INSULIN GLARGINE 100 UNIT/ML ~~LOC~~ SOLN
35.0000 [IU] | Freq: Every day | SUBCUTANEOUS | Status: DC
  Administered 2019-11-01 – 2019-11-02 (×2): 35 [IU] via SUBCUTANEOUS
  Filled 2019-11-01 (×3): qty 0.35

## 2019-11-01 NOTE — Progress Notes (Signed)
Hospitalist progress note  If 7PM-7AM,  night-coverage-look on AMION -prefer pages-not epic chat,please  Monique Cabrera  DJT:[id number] DOB: 03/02/1935 DOA: 10/29/2019 PCP: [name], DO  Narrative:  84 y/o home dwelling [place] fem htn, HLD, PAD, DM ty ii, CVA on plavix, CKD iii, EF 60-65% 2/20187 Prior CVA 01/2017 c L Internuclear ophthalmoplegia Admitted 10/29/19 with some slurred speech despite low CBG and felt could have been related to CVa so was brought to the ED for work-up\ Cardiolgy consulted 2/2 Crtiical ischemia of RLE-started on IV Vanc and ceftriaxone Assessment & Plan:  Critical R foot ischemia with osteomyelitis and leukocytosis of 23 -cont vanc flagyll ceftriaxone-from 11 4 showing no target so vascular surgeon Dr. Donnetta Hutching consulted and planning for amputation 11/6  DM ty ii-a1c this admit 8.9-, home dose 48 units CBG now slightly elevated 195-219, increasing Lantus to 35 units today  htn continue coreg 25 bid, amlodipine 10 daily  Mild hypokalemia-replaced 11/4 Potassium now 3.9 magnesium 1.7 reviewed trends a.m.  hyponatremia-continue saline at current rate  Anemia 2/2 CKD-given 1 U PRBC 11/3-iron level low at 14 and saturations borderline--Feraheme to be given 11/5  ckd iiib with mild met acidosis-stopped this admit Diovan-trends are stable with a BUN/creatinine 24/1.3  Probable multi-infarct dementia and some behavioural issues-at times confused not aware of plan-re-orient  DVT prophylaxis: lovenox   Code Status:   full    Family Communication:   none  Disposition Plan: await Arotogram  Consultants:   Cardiology   Vascular surgery Procedures:  Foot xray 11/2 1. Right foot infection with soft tissue swelling and soft tissue air along the dorsal lateral aspect of the foot as described. 2. Possible small focus of osteomyelitis along the plantar lateral aspect of the cuboid with another possible area of osteomyelitis along medial base of the proximal  phalanx of the fifth toe. No other evidence of osteomyelitis.  Other: None.  CT head 11/2 1.  No acute intracranial findings.  2.  Chronic microvascular ischemic change and cerebral volume loss.  Antimicrobials:   As above   Subjective: Awake alert coherent less combative and understands it seems that she needs an amputation  Objective: Vitals:   10/31/19 1618 10/31/19 1935 11/01/19 0635 11/01/19 0640  BP: 110/76 (!) 130/57 (!) 132/55 139/67  Pulse: 74 80 83 82  Resp: '16 16 18 18  ' Temp: 98.6 F (37 C) 99.9 F (37.7 C) 98.8 F (37.1 C) 99.4 F (37.4 C)  TempSrc: Oral Oral Oral Oral  SpO2: 100% 95% 99% 99%  Weight:    60.4 kg    Intake/Output Summary (Last 24 hours) at 11/01/2019 0900 Last data filed at 11/01/2019 0819 Gross per 24 hour  Intake 1591.32 ml  Output 200 ml  Net 1391.32 ml   Filed Weights   10/30/19 0451 10/31/19 0309 11/01/19 0640  Weight: 58.7 kg 60.2 kg 60.4 kg    Examination: EOMI NCAT coherent no distress S1-S2 no murmur rub or gallop Not on telemetry Chest is clinically clear no added sound no rales no rhonchi Abdomen is soft nontender no rebound I did not examine foot today  Data Reviewed: I have personally reviewed following labs and imaging studies CBC: Recent Labs  Lab 10/29/19 1309 10/29/19 2336 10/30/19 0552 10/31/19 0404 11/01/19 0403  WBC 25.6* 25.5* 25.3* 26.1* 23.1*  NEUTROABS  --   --   --  21.2* 18.7*  HGB 7.5* 6.8* 6.6* 8.2* 7.3*  HCT 23.6* 20.6* 19.7* 23.9* 21.7*  MCV 94.0  88.0 86.4 85.4 85.1  PLT 454* 491* 511* 457* 160*   Basic Metabolic Panel: Recent Labs  Lab 10/29/19 1309 10/29/19 2336 10/30/19 0552 10/31/19 0404 11/01/19 0403  NA 125*  --  129* 132* 132*  K 4.2  --  3.6 3.3* 3.9  CL 92*  --  99 102 103  CO2 19*  --  20* 19* 19*  GLUCOSE 328*  --  78 124* 219*  BUN 31*  --  25* 21 24*  CREATININE 1.68*  --  1.27* 1.29* 1.37*  CALCIUM 10.2  --  10.5* 10.2 9.9  MG  --  1.6*  --  1.8 1.7  PHOS  --   2.1*  --   --  1.8*   GFR: Estimated Creatinine Clearance: 28.5 mL/min (A) (by C-G formula based on SCr of 1.37 mg/dL (H)). Liver Function Tests: Recent Labs  Lab 10/29/19 1309 10/31/19 0404 11/01/19 0403  AST 26 25  --   ALT 19 17  --   ALKPHOS 68 82  --   BILITOT 1.1 0.7  --   PROT 6.3* 5.9*  --   ALBUMIN 1.9* 1.7* 1.5*   No results for input(s): LIPASE, AMYLASE in the last 168 hours. No results for input(s): AMMONIA in the last 168 hours. Coagulation Profile: Recent Labs  Lab 10/29/19 2336  INR 1.3*   Cardiac Enzymes: Radiology Studies: Reviewed images personally in health database  Scheduled Meds: . amLODipine  10 mg Oral Daily  . aspirin  81 mg Oral Daily  . atorvastatin  20 mg Oral Daily  . carvedilol  25 mg Oral BID WC  . clopidogrel  75 mg Oral Daily  . feeding supplement (ENSURE ENLIVE)  237 mL Oral TID BM  . feeding supplement (PRO-STAT SUGAR FREE 64)  30 mL Oral BID WC  . insulin aspart  0-9 Units Subcutaneous TID WC  . insulin glargine  35 Units Subcutaneous Daily  . multivitamin with minerals  1 tablet Oral Daily  . mupirocin ointment  1 application Nasal BID  . sodium chloride flush  3 mL Intravenous Q12H   Continuous Infusions: . sodium chloride    . cefTRIAXone (ROCEPHIN)  IV 2 g (10/31/19 1721)  . ferumoxytol    . metronidazole 500 mg (11/01/19 0640)  . vancomycin 1,250 mg (10/31/19 0837)    LOS: 3 days   Time spent: [place], MD [facility]  11/01/2019, 9:00 AM

## 2019-11-01 NOTE — Progress Notes (Addendum)
Progress Note  Patient Name: Monique Cabrera Date of Encounter: 11/01/2019  Primary Cardiologist: Kathlyn Sacramento, MD   Subjective   Patient is awake and alert on exam. No CP or SOB. Plan for R BKA tomorrow.   Inpatient Medications    Scheduled Meds: . amLODipine  10 mg Oral Daily  . aspirin  81 mg Oral Daily  . atorvastatin  20 mg Oral Daily  . carvedilol  25 mg Oral BID WC  . clopidogrel  75 mg Oral Daily  . feeding supplement (ENSURE ENLIVE)  237 mL Oral TID BM  . feeding supplement (PRO-STAT SUGAR FREE 64)  30 mL Oral BID WC  . insulin aspart  0-9 Units Subcutaneous TID WC  . insulin glargine  35 Units Subcutaneous Daily  . multivitamin with minerals  1 tablet Oral Daily  . mupirocin ointment  1 application Nasal BID  . sodium chloride flush  3 mL Intravenous Q12H   Continuous Infusions: . sodium chloride    . cefTRIAXone (ROCEPHIN)  IV 2 g (10/31/19 1721)  . ferumoxytol    . metronidazole 500 mg (11/01/19 0640)  . vancomycin 1,250 mg (10/31/19 0837)   PRN Meds: sodium chloride, acetaminophen **OR** acetaminophen, HYDROcodone-acetaminophen, ondansetron **OR** ondansetron (ZOFRAN) IV, polyethylene glycol, sodium chloride flush   Vital Signs    Vitals:   10/31/19 1618 10/31/19 1935 11/01/19 0635 11/01/19 0640  BP: 110/76 (!) 130/57 (!) 132/55 139/67  Pulse: 74 80 83 82  Resp: 16 16 18 18   Temp: 98.6 F (37 C) 99.9 F (37.7 C) 98.8 F (37.1 C) 99.4 F (37.4 C)  TempSrc: Oral Oral Oral Oral  SpO2: 100% 95% 99% 99%  Weight:    60.4 kg    Intake/Output Summary (Last 24 hours) at 11/01/2019 0913 Last data filed at 11/01/2019 0819 Gross per 24 hour  Intake 1591.32 ml  Output -  Net 1591.32 ml   Last 3 Weights 11/01/2019 10/31/2019 10/30/2019  Weight (lbs) 133 lb 2.5 oz 132 lb 11.5 oz 129 lb 6.4 oz  Weight (kg) 60.4 kg 60.2 kg 58.695 kg      Telemetry    N/A - Personally Reviewed  ECG    No new - Personally Reviewed  Physical Exam   GEN: No acute  distress.   Neck: No JVD Cardiac: RRR, no murmurs, rubs, or gallops.  Respiratory: Clear to auscultation bilaterally. GI: Soft, nontender, non-distended  MS: right gangrenous.necrotic third and fourth toe Neuro:  Nonfocal  Psych: Normal affect   Labs    High Sensitivity Troponin:  No results for input(s): TROPONINIHS in the last 720 hours.    Chemistry Recent Labs  Lab 10/29/19 1309 10/30/19 0552 10/31/19 0404 11/01/19 0403  NA 125* 129* 132* 132*  K 4.2 3.6 3.3* 3.9  CL 92* 99 102 103  CO2 19* 20* 19* 19*  GLUCOSE 328* 78 124* 219*  BUN 31* 25* 21 24*  CREATININE 1.68* 1.27* 1.29* 1.37*  CALCIUM 10.2 10.5* 10.2 9.9  PROT 6.3*  --  5.9*  --   ALBUMIN 1.9*  --  1.7* 1.5*  AST 26  --  25  --   ALT 19  --  17  --   ALKPHOS 68  --  82  --   BILITOT 1.1  --  0.7  --   GFRNONAA 28* 39* 39* 36*  GFRAA 32* 46* 45* 42*  ANIONGAP 14 10 11 10      Hematology Recent Labs  Lab 10/30/19  8242 10/31/19 0404 11/01/19 0403  WBC 25.3* 26.1* 23.1*  RBC 2.28* 2.80* 2.55*  HGB 6.6* 8.2* 7.3*  HCT 19.7* 23.9* 21.7*  MCV 86.4 85.4 85.1  MCH 28.9 29.3 28.6  MCHC 33.5 34.3 33.6  RDW 13.4 14.5 14.6  PLT 511* 457* 407*    BNPNo results for input(s): BNP, PROBNP in the last 168 hours.   DDimer No results for input(s): DDIMER in the last 168 hours.   Radiology    Vas [place] Lower Extremity Saphenous Vein Mapping  Result Date: 10/31/2019 LOWER EXTREMITY VEIN MAPPING Indications: pre op bypass  Performing Technologist: June Leap RDMS, RVT  Examination Guidelines: A complete evaluation includes B-mode imaging, spectral Doppler, color Doppler, and power Doppler as needed of all accessible portions of each vessel. Bilateral testing is considered an integral part of a complete examination. Limited examinations for reoccurring indications may be performed as noted. +--------------+-------------+--------------------+-------------+--------------+  RT Diameter   RT Findings         GSV           LT Diameter  LT Findings        (cm)                                          (cm)                    +--------------+-------------+--------------------+-------------+--------------+      0.30                     Saphenofemoral       0.35                                                     Junction                                  +--------------+-------------+--------------------+-------------+--------------+      0.25       branching     Proximal thigh       0.18                    +--------------+-------------+--------------------+-------------+--------------+                    not          Mid thigh          0.17                                   visualized                                                  +--------------+-------------+--------------------+-------------+--------------+                    not         Distal thigh                 not visualized  visualized                                                  +--------------+-------------+--------------------+-------------+--------------+                    not             Knee            0.12                                   visualized                                                  +--------------+-------------+--------------------+-------------+--------------+      0.13                       Prox calf          0.13       branching    +--------------+-------------+--------------------+-------------+--------------+      0.12                        Mid calf          0.12                    +--------------+-------------+--------------------+-------------+--------------+ Diagnosing physician: Curt Jews MD Electronically signed by Curt Jews MD on 10/31/2019 at 6:22:37 PM.    Final     Cardiac Studies   Peripheral vascular catheterization 10/31/19 1.  No significant aortoiliac disease. 2.  Right lower extremity: Long occlusion of the SFA and proximal  popliteal artery with reconstitution via collaterals in the mid popliteal artery with two-vessel runoff below the knee. 3.  Left lower extremity: Subtotal heavily calcified disease in the common femoral artery with long occlusion of the SFA and proximal popliteal artery with reconstitution via collaterals in the mid popliteal artery with one-vessel runoff below the knee via the anterior tibial artery.  Recommendations: I think the best option is right femoropopliteal bypass.  The patient also has severe disease on the left side and at some point might require common femoral endarterectomy and femoropopliteal bypass as well.  However, the right leg is the priority right now.  I discussed the case with Dr. Donnetta Hutching and consulted him.  Doppler 10/16/2019  Calcific atherosclerosis throughout the aorta and bilateral lower extremity arteries.  Right: Total occlusion noted in the superficial femoral artery. Total occlusion noted in the posterior tibial artery. Total occlusion noted in the peroneal artery.  Left: Total occlusion noted in the superficial femoral artery. Total occlusion noted in the superficial femoral artery and/or popliteal artery. Total occlusion noted in the posterior tibial artery. Suspected occlusion of the left common iliac artery;  unable to detect flow within the vessel and blunted monophasic waveforms in the external iliac and common femoral arteries.  Patient Profile     84 y.o. female with severe PAD and gangrene/osteomylitis in the right foot.  Assessment & Plan    1. PAD - Patient has severe bilateral lower extremity arterial disease.  - Underwent aortogram lower  extremity with Dr Fletcher Anon yesterday which showed bilateral SFA occlusion. Case was discussed with Dr. Donnetta Hutching.  - Plan to proceed with R BKA tomorrow. - continue Asa, plavix, statin  2. HLD - continue Lipitor 20 mg  3. AKI/ CKD  - creatinine 1.37, appears to be around baseline  4. Sepsis - WBC remains  markedly elevated 23.1 - abx per IM - Patient appears less confused  5. DM  - Poor chronic control - A1C 8.9  6. HTN  - continue amlodipine - Coreg held       For questions or updates, please contact [place] Please consult [email or web address] for contact info under        Signed, Cadence Ninfa Meeker, PA-C  11/01/2019, 9:13 AM    I have seen and examined the patient along with Cadence Ninfa Meeker, PA-C .  I have reviewed the chart, notes and new data.  I agree with PA/NP's note.  Key new complaints: clearly upset about prospect of BKA, otherwise no new somatic complaints. Key examination changes: gangrenous R foot Key new findings / data: rev'd Dr. Luther Parody recommendations  PLAN: Will follow periop.  Sanda Klein, MD, [place] [phone] 11/01/2019, 1:19 PM

## 2019-11-01 NOTE — Progress Notes (Signed)
Patient ID: Monique Cabrera, female   DOB: [birth year], 84 y.o.   MRN: [id number] I had a long discussion again this morning with the patient.  She has now accepted that there is no alternative other than amputation.  I again discussed the option of below-knee amputation with better rehab potential versus above-knee amputation with higher rate of initial healing.  She understands and wishes to proceed with right below-knee amputation tomorrow.

## 2019-11-01 NOTE — H&P (View-Only) (Signed)
Patient ID: Monique Cabrera, female   DOB: 11-17-1935, 84 y.o.   MRN: [id number] I had a long discussion again this morning with the patient.  She has now accepted that there is no alternative other than amputation.  I again discussed the option of below-knee amputation with better rehab potential versus above-knee amputation with higher rate of initial healing.  She understands and wishes to proceed with right below-knee amputation tomorrow.

## 2019-11-01 NOTE — Plan of Care (Signed)
  Problem: Safety: Goal: Ability to remain free from injury will improve Outcome: Not Progressing   

## 2019-11-02 ENCOUNTER — Inpatient Hospital Stay (HOSPITAL_COMMUNITY): Payer: Medicare Other | Admitting: Certified Registered"

## 2019-11-02 ENCOUNTER — Encounter (HOSPITAL_COMMUNITY)
Admission: EM | Disposition: A | Discharge status: Home Health | Payer: Self-pay | Source: Home / Self Care | Attending: Family Medicine

## 2019-11-02 ENCOUNTER — Encounter (HOSPITAL_COMMUNITY): Payer: Self-pay

## 2019-11-02 HISTORY — PX: AMPUTATION: SHX166

## 2019-11-02 LAB — GLUCOSE, CAPILLARY
Glucose-Capillary: 139 mg/dL — ABNORMAL HIGH (ref 70–99)
Glucose-Capillary: 137 mg/dL — ABNORMAL HIGH (ref 70–99)
Glucose-Capillary: 125 mg/dL — ABNORMAL HIGH (ref 70–99)
Glucose-Capillary: 103 mg/dL — ABNORMAL HIGH (ref 70–99)
Glucose-Capillary: 64 mg/dL — ABNORMAL LOW (ref 70–99)
Glucose-Capillary: 96 mg/dL (ref 70–99)
Glucose-Capillary: 59 mg/dL — ABNORMAL LOW (ref 70–99)
Glucose-Capillary: 75 mg/dL (ref 70–99)

## 2019-11-02 LAB — CBC WITH DIFFERENTIAL/PLATELET
Abs Immature Granulocytes: 0 10*3/uL (ref 0.00–0.07)
Basophils Absolute: 0 10*3/uL (ref 0.0–0.1)
Basophils Relative: 0 %
Eosinophils Absolute: 0 10*3/uL (ref 0.0–0.5)
Eosinophils Relative: 0 %
HCT: 22.9 % — ABNORMAL LOW (ref 36.0–46.0)
Hemoglobin: 7.7 g/dL — ABNORMAL LOW (ref 12.0–15.0)
Lymphocytes Relative: 4 %
Lymphs Abs: 1.1 10*3/uL (ref 0.7–4.0)
MCH: 29.2 pg (ref 26.0–34.0)
MCHC: 33.6 g/dL (ref 30.0–36.0)
MCV: 86.7 fL (ref 80.0–100.0)
Monocytes Absolute: 1.4 10*3/uL — ABNORMAL HIGH (ref 0.1–1.0)
Monocytes Relative: 5 %
Neutro Abs: 24.9 10*3/uL — ABNORMAL HIGH (ref 1.7–7.7)
Neutrophils Relative %: 91 %
Platelets: 447 10*3/uL — ABNORMAL HIGH (ref 150–400)
RBC: 2.64 MIL/uL — ABNORMAL LOW (ref 3.87–5.11)
RDW: 14.6 % (ref 11.5–15.5)
WBC: 27.4 10*3/uL — ABNORMAL HIGH (ref 4.0–10.5)
nRBC: 0 % (ref 0.0–0.2)
nRBC: 0 /100 WBC

## 2019-11-02 LAB — BASIC METABOLIC PANEL
Anion gap: 10 (ref 5–15)
BUN: 29 mg/dL — ABNORMAL HIGH (ref 8–23)
CO2: 21 mmol/L — ABNORMAL LOW (ref 22–32)
Calcium: 10.5 mg/dL — ABNORMAL HIGH (ref 8.9–10.3)
Chloride: 103 mmol/L (ref 98–111)
Creatinine, Ser: 1.53 mg/dL — ABNORMAL HIGH (ref 0.44–1.00)
GFR calc Af Amer: 36 mL/min — ABNORMAL LOW (ref >60)
GFR calc non Af Amer: 31 mL/min — ABNORMAL LOW (ref >60)
Glucose, Bld: 147 mg/dL — ABNORMAL HIGH (ref 70–99)
Potassium: 3.6 mmol/L (ref 3.5–5.1)
Sodium: 134 mmol/L — ABNORMAL LOW (ref 135–145)

## 2019-11-02 SURGERY — AMPUTATION BELOW KNEE
Anesthesia: Choice | Site: Knee | Laterality: Right

## 2019-11-02 MED ORDER — 0.9 % SODIUM CHLORIDE (POUR BTL) OPTIME
TOPICAL | Status: DC | PRN
  Administered 2019-11-02: 1000 mL

## 2019-11-02 MED ORDER — PHENOL 1.4 % MT LIQD: 1.0000 | OROMUCOSAL | Status: DC | PRN

## 2019-11-02 MED ORDER — SODIUM CHLORIDE 0.9 % IV SOLN
INTRAVENOUS | Status: DC
  Administered 2019-11-02 – 2019-11-03 (×2): via INTRAVENOUS

## 2019-11-02 MED ORDER — GUAIFENESIN-DM 100-10 MG/5ML PO SYRP: 15.0000 mL | ORAL_SOLUTION | ORAL | Status: DC | PRN

## 2019-11-02 MED ORDER — HEPARIN SODIUM (PORCINE) 5000 UNIT/ML IJ SOLN
5000.0000 [IU] | Freq: Three times a day (TID) | INTRAMUSCULAR | Status: DC
  Administered 2019-11-03 – 2019-11-04 (×4): 5000 [IU] via SUBCUTANEOUS
  Filled 2019-11-02 (×4): qty 1

## 2019-11-02 MED ORDER — PHENYLEPHRINE HCL-NACL 10-0.9 MG/250ML-% IV SOLN
INTRAVENOUS | Status: DC | PRN
  Administered 2019-11-02: 60 ug/min via INTRAVENOUS

## 2019-11-02 MED ORDER — DEXTROSE 50 % IV SOLN
INTRAVENOUS | Status: AC
  Filled 2019-11-02: qty 50

## 2019-11-02 MED ORDER — ONDANSETRON HCL 4 MG/2ML IJ SOLN
INTRAMUSCULAR | Status: AC
  Filled 2019-11-02: qty 2

## 2019-11-02 MED ORDER — METOPROLOL TARTRATE 5 MG/5ML IV SOLN: 2.0000 mg | INTRAVENOUS | Status: DC | PRN

## 2019-11-02 MED ORDER — ROPIVACAINE HCL 5 MG/ML IJ SOLN
INTRAMUSCULAR | Status: DC | PRN
  Administered 2019-11-02: 22 mL via PERINEURAL
  Administered 2019-11-02: 8 mL via PERINEURAL

## 2019-11-02 MED ORDER — LIDOCAINE 2% (20 MG/ML) 5 ML SYRINGE
INTRAMUSCULAR | Status: DC | PRN
  Administered 2019-11-02: 100 mg via INTRAVENOUS

## 2019-11-02 MED ORDER — LABETALOL HCL 5 MG/ML IV SOLN: 10.0000 mg | INTRAVENOUS | Status: DC | PRN

## 2019-11-02 MED ORDER — CLONIDINE HCL (ANALGESIA) 100 MCG/ML EP SOLN
EPIDURAL | Status: DC | PRN
  Administered 2019-11-02 (×2): 50 ug

## 2019-11-02 MED ORDER — FENTANYL CITRATE (PF) 100 MCG/2ML IJ SOLN
INTRAMUSCULAR | Status: AC
  Administered 2019-11-02: 100 ug via INTRAVENOUS
  Filled 2019-11-02: qty 2

## 2019-11-02 MED ORDER — FENTANYL CITRATE (PF) 250 MCG/5ML IJ SOLN
INTRAMUSCULAR | Status: DC | PRN
  Administered 2019-11-02: 50 ug via INTRAVENOUS
  Administered 2019-11-02: 25 ug via INTRAVENOUS
  Administered 2019-11-02: 50 ug via INTRAVENOUS

## 2019-11-02 MED ORDER — LACTATED RINGERS IV SOLN
INTRAVENOUS | Status: DC
  Administered 2019-11-02: 13:00:00 via INTRAVENOUS

## 2019-11-02 MED ORDER — PROPOFOL 10 MG/ML IV BOLUS
INTRAVENOUS | Status: DC | PRN
  Administered 2019-11-02: 30 mg via INTRAVENOUS
  Administered 2019-11-02: 100 mg via INTRAVENOUS

## 2019-11-02 MED ORDER — VANCOMYCIN HCL IN DEXTROSE 1-5 GM/200ML-% IV SOLN
1000.0000 mg | INTRAVENOUS | Status: DC
  Administered 2019-11-02: 1000 mg via INTRAVENOUS
  Filled 2019-11-02 (×2): qty 200

## 2019-11-02 MED ORDER — MIDAZOLAM HCL 2 MG/2ML IJ SOLN
INTRAMUSCULAR | Status: AC
  Filled 2019-11-02: qty 2

## 2019-11-02 MED ORDER — HYDRALAZINE HCL 20 MG/ML IJ SOLN: 5.0000 mg | INTRAMUSCULAR | Status: DC | PRN

## 2019-11-02 MED ORDER — FENTANYL CITRATE (PF) 100 MCG/2ML IJ SOLN
100.0000 ug | Freq: Once | INTRAMUSCULAR | Status: AC
  Administered 2019-11-02: 15:00:00 100 ug via INTRAVENOUS

## 2019-11-02 MED ORDER — MORPHINE SULFATE (PF) 2 MG/ML IV SOLN
2.0000 mg | INTRAVENOUS | Status: DC | PRN
  Administered 2019-11-03: 2 mg via INTRAVENOUS
  Filled 2019-11-02: qty 1

## 2019-11-02 MED ORDER — FENTANYL CITRATE (PF) 250 MCG/5ML IJ SOLN
INTRAMUSCULAR | Status: AC
  Filled 2019-11-02: qty 5

## 2019-11-02 MED ORDER — ALUM & MAG HYDROXIDE-SIMETH 200-200-20 MG/5ML PO SUSP: 15.0000 mL | ORAL | Status: DC | PRN

## 2019-11-02 MED ORDER — EPHEDRINE SULFATE-NACL 50-0.9 MG/10ML-% IV SOSY
PREFILLED_SYRINGE | INTRAVENOUS | Status: DC | PRN
  Administered 2019-11-02 (×2): 10 mg via INTRAVENOUS
  Administered 2019-11-02 (×2): 15 mg via INTRAVENOUS

## 2019-11-02 MED ORDER — DEXTROSE 50 % IV SOLN
12.5000 mL | Freq: Once | INTRAVENOUS | Status: AC
  Administered 2019-11-02: 12.5 mL via INTRAVENOUS

## 2019-11-02 SURGICAL SUPPLY — 44 items
BANDAGE ESMARK 6X9 LF (GAUZE/BANDAGES/DRESSINGS) IMPLANT
BLADE SAW GIGLI 510 (BLADE) ×2 IMPLANT
BLADE SAW GIGLI 510MM (BLADE) ×1
BNDG ELASTIC 4X5.8 VLCR STR LF (GAUZE/BANDAGES/DRESSINGS) ×3 IMPLANT
BNDG ELASTIC 6X15 VLCR STRL LF (GAUZE/BANDAGES/DRESSINGS) ×2 IMPLANT
BNDG ELASTIC 6X5.8 VLCR STR LF (GAUZE/BANDAGES/DRESSINGS) IMPLANT
BNDG ESMARK 6X9 LF (GAUZE/BANDAGES/DRESSINGS)
BNDG GAUZE ELAST 4 BULKY (GAUZE/BANDAGES/DRESSINGS) ×2 IMPLANT
CANISTER SUCT 3000ML PPV (MISCELLANEOUS) ×3 IMPLANT
CLIP LIGATING EXTRA MED SLVR (CLIP) ×3 IMPLANT
CLIP LIGATING EXTRA SM BLUE (MISCELLANEOUS) ×3 IMPLANT
COVER SURGICAL LIGHT HANDLE (MISCELLANEOUS) ×6 IMPLANT
COVER WAND RF STERILE (DRAPES) ×3 IMPLANT
CUFF TOURN SGL QUICK 34 (TOURNIQUET CUFF)
CUFF TOURN SGL QUICK 42 (TOURNIQUET CUFF) IMPLANT
CUFF TRNQT CYL 34X4.125X (TOURNIQUET CUFF) IMPLANT
DRAIN SNY 10X20 3/4 PERF (WOUND CARE) IMPLANT
DRAPE HALF SHEET 40X57 (DRAPES) ×3 IMPLANT
DRAPE ORTHO SPLIT 77X108 STRL (DRAPES) ×4
DRAPE SURG ORHT 6 SPLT 77X108 (DRAPES) ×2 IMPLANT
ELECT REM PT RETURN 9FT ADLT (ELECTROSURGICAL) ×3
ELECTRODE REM PT RTRN 9FT ADLT (ELECTROSURGICAL) ×1 IMPLANT
EVACUATOR SILICONE 100CC (DRAIN) IMPLANT
GAUZE SPONGE 4X4 12PLY STRL (GAUZE/BANDAGES/DRESSINGS) ×3 IMPLANT
GAUZE XEROFORM 5X9 LF (GAUZE/BANDAGES/DRESSINGS) ×3 IMPLANT
GLOVE SS BIOGEL STRL SZ 7.5 (GLOVE) ×1 IMPLANT
GLOVE SUPERSENSE BIOGEL SZ 7.5 (GLOVE) ×2
GOWN STRL REUS W/ TWL LRG LVL3 (GOWN DISPOSABLE) ×3 IMPLANT
GOWN STRL REUS W/TWL LRG LVL3 (GOWN DISPOSABLE) ×6
KIT BASIN OR (CUSTOM PROCEDURE TRAY) ×3 IMPLANT
KIT TURNOVER KIT B (KITS) ×3 IMPLANT
NS IRRIG 1000ML POUR BTL (IV SOLUTION) ×3 IMPLANT
PACK GENERAL/GYN (CUSTOM PROCEDURE TRAY) ×3 IMPLANT
PAD ARMBOARD 7.5X6 YLW CONV (MISCELLANEOUS) ×6 IMPLANT
PADDING CAST COTTON 6X4 STRL (CAST SUPPLIES) IMPLANT
STAPLER VISISTAT 35W (STAPLE) ×3 IMPLANT
STOCKINETTE IMPERVIOUS LG (DRAPES) ×3 IMPLANT
SUT ETHILON 3 0 PS 1 (SUTURE) IMPLANT
SUT VIC AB 0 CT1 18XCR BRD 8 (SUTURE) ×2 IMPLANT
SUT VIC AB 0 CT1 8-18 (SUTURE) ×4
SUT VICRYL AB 2 0 TIES (SUTURE) ×3 IMPLANT
TOWEL GREEN STERILE (TOWEL DISPOSABLE) ×6 IMPLANT
UNDERPAD 30X30 (UNDERPADS AND DIAPERS) ×3 IMPLANT
WATER STERILE IRR 1000ML POUR (IV SOLUTION) ×3 IMPLANT

## 2019-11-02 NOTE — Anesthesia Procedure Notes (Signed)
Anesthesia Regional Block: Popliteal block   Pre-Anesthetic Checklist: ,, timeout performed, Correct Patient, Correct Site, Correct Laterality, Correct Procedure, Correct Position, site marked, Risks and benefits discussed, pre-op evaluation,  At surgeon's request and post-op pain management  Laterality: Right  Prep: Maximum Sterile Barrier Precautions used, chloraprep       Needles:  Injection technique: Single-shot  Needle Type: Echogenic Needle     Needle Length: 9cm  Needle Gauge: 21     Additional Needles:   Procedures:,,,, ultrasound used (permanent image in chart),,,,  Narrative:  Start time: 11/02/2019 2:30 PM End time: 11/02/2019 2:35 PM Injection made incrementally with aspirations every 5 mL.  Performed by: Personally  Anesthesiologist: Barnet Glasgow, MD  Additional Notes: Block assessed. Patient tolerated procedure well.

## 2019-11-02 NOTE — Anesthesia Procedure Notes (Signed)
Anesthesia Regional Block: Adductor canal block   Pre-Anesthetic Checklist: ,, timeout performed, Correct Patient, Correct Site, Correct Laterality, Correct Procedure, Correct Position, site marked, Risks and benefits discussed,  Surgical consent,  Pre-op evaluation,  At surgeon's request and post-op pain management  Laterality: Right  Prep: chloraprep       Needles:  Injection technique: Single-shot  Needle Type: Echogenic Needle     Needle Length: 9cm  Needle Gauge: 22     Additional Needles:   Procedures:,,,, ultrasound used (permanent image in chart),,,,  Narrative:  Start time: 11/02/2019 2:35 PM End time: 11/02/2019 2:39 PM Injection made incrementally with aspirations every 5 mL.  Performed by: Personally  Anesthesiologist: Barnet Glasgow, MD  Additional Notes: Block assessed prior to surgery. Pt tolerated procedure well.

## 2019-11-02 NOTE — Transfer of Care (Signed)
Immediate Anesthesia Transfer of Care Note  Patient: Monique Cabrera  Procedure(s) Performed: AMPUTATION BELOW KNEE RIGHT (Right Knee)  Patient Location: PACU  Anesthesia Type:GA combined with regional for post-op pain  Level of Consciousness: lethargic and responds to stimulation  Airway & Oxygen Therapy: Patient Spontanous Breathing and Patient connected to nasal cannula oxygen  Post-op Assessment: Report given to RN and Post -op Vital signs reviewed and stable  Post vital signs: Reviewed and stable  Last Vitals:  Vitals Value Taken Time  BP 101/46 11/02/19 1644  Temp    Pulse 56 11/02/19 1645  Resp 27 11/02/19 1645  SpO2 89 % 11/02/19 1645  Vitals shown include unvalidated device data.  Last Pain:  Vitals:   11/02/19 1445  TempSrc:   PainSc: 0-No pain      Patients Stated Pain Goal: 2 (29/56/21 3086)  Complications: No apparent anesthesia complications

## 2019-11-02 NOTE — Progress Notes (Signed)
Pharmacy Antibiotic Note  Monique Cabrera is a 84 y.o. female admitted on 10/29/2019 with osteomyelitis and critical limb ischemia of RLE .  Pharmacy has been consulted for vancomycin dosing. Scr trending down, vancomycin dose adjusted. Pending PAD vascular study 11/4, possible procedure this admission.   SCr increase to 1.53, UOP not well recorded, for BKA per vascular likely today for source control.  Continues on ceftriaxone + flagyl per MD.  Plan: Decrease vancomycin to 1000mg  IV every 48 hours Goal AUC 400-550. Expected AUC: 449 F/u renal function, LOT for abx post BKA Vancomycin levels as needed   Height: 5\' 5"  (165.1 cm) Weight: 133 lb 2.5 oz (60.4 kg) IBW/kg (Calculated) : 57  Temp (24hrs), Avg:98.6 F (37 C), Min:98.5 F (36.9 C), Max:98.9 F (37.2 C)  Recent Labs  Lab 10/29/19 1309 10/29/19 1715 10/29/19 2336 10/30/19 0552 10/30/19 0558 10/31/19 0404 11/01/19 0403 11/02/19 0521  WBC 25.6*  --  25.5* 25.3*  --  26.1* 23.1* 27.4*  CREATININE 1.68*  --   --  1.27*  --  1.29* 1.37* 1.53*  LATICACIDVEN  --  1.4 1.2  --  1.3  --   --   --     Estimated Creatinine Clearance: 25.5 mL/min (A) (by C-G formula based on SCr of 1.53 mg/dL (H)).     Antimicrobials this admission:  Ceftriaxone 11/2 >  Vancomycin  11/2 >  Metronidazole  11/2 >  Dose adjustments this admission: 11/3 Vancomycin 1000mg   Q48 hr to 1250mg  Q48 hr   11/3 Vancomycin 1250mg  Q48hr   Scr used: 1.27   Est AUC: 482.2 mcg*h/mL  Microbiology results: 11/2 BCx: NGTD 11/2 UCx: pending  11/2 MRSA PCR:neg  Bertis Ruddy, PharmD Clinical Pharmacist Please check AMION for all [place] numbers 11/02/2019 8:52 AM

## 2019-11-02 NOTE — Care Management Important Message (Signed)
Important Message  Patient Details  Name: Monique Cabrera MRN: [id number] Date of Birth: 1935/04/03   Medicare Important Message Given:  Yes     Orbie Pyo 11/02/2019, 4:09 PM

## 2019-11-02 NOTE — TOC Initial Note (Signed)
Transition of Care [facility]) - Initial/Assessment Note    Patient Details  Name: Monique Cabrera MRN: [id number] Date of Birth: 10/08/1935  Transition of Care [facility] CM/SW Contact:    Atilano Median, LCSW Phone Number: 11/02/2019, 1:06 PM  Clinical Narrative:                 CSW spoke with patient regarding the discharge plan. Patient preparing for a R BKA today. Dispo plan unknown at this time. CSW will continue to follow for needs.  Expected Discharge Plan: Skilled Nursing Facility Barriers to Discharge: Continued Medical Work up   Patient Goals and CMS Choice Patient states their goals for this hospitalization and ongoing recovery are:: go home      Expected Discharge Plan and Services Expected Discharge Plan: [place] In-house Referral: Clinical Social Work Discharge Planning Services: CM Consult   Living arrangements for the past 2 months: [place]                       Prior Living Arrangements/Services Living arrangements for the past 2 months: Single Family Home Lives with:: Self, Adult Children   Do you feel safe going back to the place where you live?: Yes      Need for Family Participation in Patient Care: Yes (Comment) Care giver support system in place?: Yes (comment)      Activities of Daily Living Home Assistive Devices/Equipment: Eyeglasses, CBG Meter, Dentures (specify type), Bedside commode/3-in-1 ADL Screening (condition at time of admission) Patient's cognitive ability adequate to safely complete daily activities?: Yes Is the patient deaf or have difficulty hearing?: No Does the patient have difficulty seeing, even when wearing glasses/contacts?: No Does the patient have difficulty concentrating, remembering, or making decisions?: No Patient able to express need for assistance with ADLs?: Yes Does the patient have difficulty dressing or bathing?: Yes Independently performs ADLs?: Yes (appropriate for developmental  age) Does the patient have difficulty walking or climbing stairs?: Yes Weakness of Legs: Both Weakness of Arms/Hands: None  Permission Sought/Granted                  Emotional Assessment       Orientation: : Oriented to Self, Oriented to Place, Oriented to  Time, Oriented to Situation      Admission diagnosis:  Gangrene of right foot [name]) [I96] Patient Active Problem List   Diagnosis Date Noted  . Gangrene of right foot ([place])   . Osteomyelitis ([place]) 10/29/2019  . Hyponatremia 10/29/2019  . Chronic diastolic CHF (congestive heart failure) ([place]) 10/29/2019  . Stable proliferative diabetic retinopathy of both eyes associated with type 2 diabetes mellitus ([place]) 06/05/2018  . Acute CVA (cerebrovascular accident) ([place]) 02/18/2017  . Acute ischemic stroke ([place])   . Internuclear ophthalmoplegia of left eye   . Benign paroxysmal positional vertigo   . Abnormality of gait   . Acute onset of severe vertigo 02/17/2017  . Vertigo 02/17/2017  . Atherosclerosis of native artery of extremity with intermittent claudication ([place]) 03/11/2014  . Diabetic retinopathy ([place]) 03/11/2014  . Retinal hemorrhage due to secondary diabetes ([place]) 03/11/2014  . Type II diabetes mellitus with renal manifestations, uncontrolled ([place]) 03/11/2014  . Chronic hepatitis C without hepatic coma ([place]) 03/11/2014  . Hyperlipidemia 03/11/2014  . Hypoglycemia 04/16/2013  . Disorder of bone and cartilage, unspecified   . Other and unspecified hyperlipidemia   . Essential hypertension, benign   . Atherosclerosis of native artery of extremity ([place])   .  Chronic kidney disease (CKD), stage II (mild)   . Peripheral arterial disease ([place])   . Anemia   . Postherpetic neuralgia   . Hypertension    PCP:  [name], DO Pharmacy:   [id number] [place], [place] [address] [place] [address] Phone: [phone] Fax:  [phone]     Social Determinants of Health (SDOH) Interventions    Readmission Risk Interventions Readmission Risk Prevention Plan 11/02/2019  Transportation Screening Complete  PCP or Specialist Appt within 3-5 Days Complete  HRI or [place] Complete  Social Work Consult for [place] Planning/Counseling Complete  Palliative Care Screening Not Applicable  Medication Review (RN Care Manager) Referral to Pharmacy  Some recent data might be hidden

## 2019-11-02 NOTE — Anesthesia Procedure Notes (Signed)
Procedure Name: LMA Insertion Date/Time: 11/02/2019 3:25 PM Performed by: Janace Litten, CRNA Pre-anesthesia Checklist: Patient identified, Emergency Drugs available, Suction available and Patient being monitored Patient Re-evaluated:Patient Re-evaluated prior to induction Oxygen Delivery Method: Circle System Utilized Preoxygenation: Pre-oxygenation with 100% oxygen Induction Type: IV induction Ventilation: Mask ventilation without difficulty LMA: LMA inserted LMA Size: 3.0 Number of attempts: 1 Placement Confirmation: positive ETCO2 Tube secured with: Tape Dental Injury: Teeth and Oropharynx as per pre-operative assessment

## 2019-11-02 NOTE — Interval H&P Note (Signed)
History and Physical Interval Note:  11/02/2019 3:00 PM  Monique Cabrera  has presented today for surgery, with the diagnosis of NON-VIABLE TISSUE RIGHT LOWER EXTREMITY.  The various methods of treatment have been discussed with the patient and family. After consideration of risks, benefits and other options for treatment, the patient has consented to  Procedure(s): AMPUTATION BELOW KNEE RIGHT (Right) as a surgical intervention.  The patient's history has been reviewed, patient examined, no change in status, stable for surgery.  I have reviewed the patient's chart and labs.  Questions were answered to the patient's satisfaction.     Curt Jews

## 2019-11-02 NOTE — Plan of Care (Signed)
  Problem: Safety: Goal: Ability to remain free from injury will improve Outcome: Progressing   Problem: Pain Managment: Goal: General experience of comfort will improve Outcome: Progressing   Problem: Elimination: Goal: Will not experience complications related to bowel motility Outcome: Progressing   Problem: Clinical Measurements: Goal: Diagnostic test results will improve Outcome: Progressing   Problem: Clinical Measurements: Goal: Ability to maintain clinical measurements within normal limits will improve Outcome: Progressing

## 2019-11-02 NOTE — Progress Notes (Signed)
Hypoglycemic Event  CBG: 64  Treatment: D50 25 mL (12.5 gm)  Symptoms: None  Follow-up CBG: Time:1707 CBG Result:96  Possible Reasons for Event: Unknown  Comments/MD notified:Dr. Zollie Pee

## 2019-11-02 NOTE — Anesthesia Preprocedure Evaluation (Addendum)
Anesthesia Evaluation  Patient identified by MRN, date of birth, ID band Patient awake    Reviewed: Allergy & Precautions, NPO status , Patient's Chart, lab work & pertinent test results  Airway Mallampati: I  TM Distance: >3 FB Neck ROM: Full    Dental no notable dental hx. (+) Edentulous Upper, Edentulous Lower   Pulmonary former smoker,    Pulmonary exam normal breath sounds clear to auscultation       Cardiovascular hypertension, Pt. on medications + Peripheral Vascular Disease and +CHF  Normal cardiovascular exam Rhythm:Regular Rate:Normal     Neuro/Psych CVA    GI/Hepatic negative GI ROS, Neg liver ROS,   Endo/Other  diabetes  Renal/GU Renal disease     Musculoskeletal negative musculoskeletal ROS (+)   Abdominal   Peds  Hematology  (+) anemia ,   Anesthesia Other Findings   Reproductive/Obstetrics                             Anesthesia Physical Anesthesia Plan  ASA: III  Anesthesia Plan: General   Post-op Pain Management: GA combined w/ Regional for post-op pain   Induction: Intravenous  PONV Risk Score and Plan: Treatment may vary due to age or medical condition, Ondansetron and Dexamethasone  Airway Management Planned: LMA  Additional Equipment:   Intra-op Plan:   Post-operative Plan:   Informed Consent: I have reviewed the patients History and Physical, chart, labs and discussed the procedure including the risks, benefits and alternatives for the proposed anesthesia with the patient or authorized representative who has indicated his/her understanding and acceptance.     Dental advisory given  Plan Discussed with: CRNA  Anesthesia Plan Comments: (Ga w pop and addctor canal blocs )       Anesthesia Quick Evaluation

## 2019-11-02 NOTE — Op Note (Signed)
    OPERATIVE REPORT  DATE OF SURGERY: 11/02/2019  PATIENT: Monique Cabrera, 84 y.o. female MRN: [id number]  DOB: 1935-07-11  PRE-OPERATIVE DIAGNOSIS: Gangrene right foot  POST-OPERATIVE DIAGNOSIS:  Same  PROCEDURE: Right below-knee amputation  SURGEON:  Curt Jews, M.D.  PHYSICIAN ASSISTANT: Nurse  ANESTHESIA: General  EBL: per anesthesia record  Total I/O In: 700 [I.V.:700] Out: 100 [Blood:100]  BLOOD ADMINISTERED: none  DRAINS: none  SPECIMEN: none  COUNTS CORRECT:  YES  PATIENT DISPOSITION:  PACU - hemodynamically stable  PROCEDURE DETAILS: Patient was taken operating placed supine position with area of the right leg is prepped draped in sterile fashion.  Incision was made several fingerbreadths below the tibial prominence anteriorly and carried around through the anterior tibial muscle bodies.  The posterior based flap with the gastrocnemius was left in place.  The anterior tibial muscle bodies were divided with electrocautery in line with the anterior incision.  The gastrocnemius was divided in line with the posterior incision.  The anterior tibial neurovascular bundle was occluded with hemostats and divided and was ligated with 0 Vicryl tie.  Periosteum was elevated off the fibula and the fibula was divided proximally with bone shears.  The soleus muscle was divided in line with the anterior incision and the popliteal artery and vein were ligated and divided.  Periosteum was elevated off the tibia and the tibia was divided with a Gigli saw.  The specimen was passed off the field.  The bone edges were smoothed with a bone rasp.  The wound was irrigated with saline and hemostasis was obtained with electrocautery.  The posterior based flap was closed to the anterior flap with interrupted 0 Vicryl figure-of-eight sutures.  The skin was closed with skin staples.  A sterile dressing and Ace wrap were applied and the patient was transferred to the recovery room in stable  condition   Rosetta Posner, M.D., [facility] 11/02/2019 4:49 PM

## 2019-11-02 NOTE — Progress Notes (Signed)
Hospitalist progress note  If 7PM-7AM,  night-coverage-look on AMION -prefer pages-not epic chat,please  Monique Cabrera  ZOX:[id number] DOB: December 09, 1935 DOA: 10/29/2019 PCP: [name], DO  Narrative:  84 y/o home dwelling [place] fem htn, HLD, PAD, DM ty ii, CVA on plavix, CKD iii, EF 60-65% 2/20187 Prior CVA 01/2017 c L Internuclear ophthalmoplegia Admitted 10/29/19 with some slurred speech despite low CBG and felt could have been related to CVa so was brought to the ED for work-up\ Cardiolgy consulted 2/2 Crtiical ischemia of RLE-started on IV Vanc and ceftriaxone Assessment & Plan:  Critical R foot ischemia with osteomyelitis and leukocytosis of 23 -cont vanc flagyll ceftriaxone-aortogram with runoff from 11 4 showing no target, for amputation 11/6  DM ty ii-a1c this admit 8.9-, home dose 48 units CBG 130s n.p.o.-continue 35 units Lantus and sliding scale Continue saline 75 cc/H  htn continue coreg 25 bid, amlodipine 10 daily  Mild hypokalemia-replaced 11/4 Potassium now 3.9 magnesium 1.7 .  hyponatremia-much improved-saline as below  Anemia 2/2 CKD-given 1 U PRBC 11/3-iron level low at 14 and saturations borderline--Feraheme to be given 11/5  ckd iiib with mild met acidosis-Diovan DC-BUN/creatinine up to 29/1.5 from 24/1.3 started saline 75 cc/H  Probable multi-infarct dementia and some behavioural issues-at times confused not aware of plan-re-orient  DVT prophylaxis: lovenox   Code Status:   full    Family Communication:   none  Disposition Plan: await Arotogram  Consultants:   Cardiology   Vascular surgery Procedures:  Foot xray 11/2 1. Right foot infection with soft tissue swelling and soft tissue air along the dorsal lateral aspect of the foot as described. 2. Possible small focus of osteomyelitis along the plantar lateral aspect of the cuboid with another possible area of osteomyelitis along medial base of the proximal phalanx of the fifth toe. No  other evidence of osteomyelitis.  Other: None.  CT head 11/2 1.  No acute intracranial findings.  2.  Chronic microvascular ischemic change and cerebral volume loss.  Antimicrobials:   As above   Subjective: Asking when she will have surgery no other complaints  Objective: Vitals:   11/01/19 1838 11/01/19 1937 11/02/19 0254 11/02/19 0754  BP:  133/60 (!) 142/51 (!) 160/55  Pulse:  71 70 82  Resp:  _0 Temp:  98.5 F (36.9 C) 98.6 F (37 C) 98.6 F (37 C)  TempSrc:  Oral Oral Oral  SpO2:  99% 100% 96%  Weight:      Height: _1  (1.651 m)       Intake/Output Summary (Last 24 hours) at 11/02/2019 1022 Last data filed at 11/01/2019 2157 Gross per 24 hour  Intake 629.71 ml  Output -  Net 629.71 ml   Filed Weights   10/30/19 0451 10/31/19 0309 11/01/19 0640  Weight: 58.7 kg 60.2 kg 60.4 kg    Examination: Awake coherent S1-S2 no murmur Chest clear Abdomen soft I did not examine foot today  Data Reviewed: I have personally reviewed following labs and imaging studies CBC: Recent Labs  Lab 10/29/19 2336 10/30/19 0552 10/31/19 0404 11/01/19 0403 11/02/19 0521  WBC 25.5* 25.3* 26.1* 23.1* 27.4*  NEUTROABS  --   --  21.2* 18.7* 24.9*  HGB 6.8* 6.6* 8.2* 7.3* 7.7*  HCT 20.6* 19.7* 23.9* 21.7* 22.9*  MCV 88.0 86.4 85.4 85.1 86.7  PLT 491* 511* 457* 407* 811*   Basic Metabolic Panel: Recent Labs  Lab 10/29/19 1309 10/29/19 2336 10/30/19 0552 10/31/19 0404 11/01/19 0403  11/02/19 0521  NA 125*  --  129* 132* 132* 134*  K 4.2  --  3.6 3.3* 3.9 3.6  CL 92*  --  99 102 103 103  CO2 19*  --  20* 19* 19* 21*  GLUCOSE 328*  --  78 124* 219* 147*  BUN 31*  --  25* 21 24* 29*  CREATININE 1.68*  --  1.27* 1.29* 1.37* 1.53*  CALCIUM 10.2  --  10.5* 10.2 9.9 10.5*  MG  --  1.6*  --  1.8 1.7  --   PHOS  --  2.1*  --   --  1.8*  --    GFR: Estimated Creatinine Clearance: 25.5 mL/min (A) (by C-G formula based on SCr of 1.53 mg/dL (H)). Liver Function  Tests: Recent Labs  Lab 10/29/19 1309 10/31/19 0404 11/01/19 0403  AST 26 25  --   ALT 19 17  --   ALKPHOS 68 82  --   BILITOT 1.1 0.7  --   PROT 6.3* 5.9*  --   ALBUMIN 1.9* 1.7* 1.5*   No results for input(s): LIPASE, AMYLASE in the last 168 hours. No results for input(s): AMMONIA in the last 168 hours. Coagulation Profile: Recent Labs  Lab 10/29/19 2336  INR 1.3*   Cardiac Enzymes: Radiology Studies: Reviewed images personally in health database  Scheduled Meds: . amLODipine  10 mg Oral Daily  . aspirin  81 mg Oral Daily  . atorvastatin  20 mg Oral Daily  . carvedilol  25 mg Oral BID WC  . clopidogrel  75 mg Oral Daily  . feeding supplement (ENSURE ENLIVE)  237 mL Oral TID BM  . feeding supplement (PRO-STAT SUGAR FREE 64)  30 mL Oral BID WC  . insulin aspart  0-9 Units Subcutaneous TID WC  . insulin glargine  35 Units Subcutaneous Daily  . multivitamin with minerals  1 tablet Oral Daily  . mupirocin ointment  1 application Nasal BID  . sodium chloride flush  3 mL Intravenous Q12H   Continuous Infusions: . sodium chloride Stopped (11/01/19 1758)  . sodium chloride 75 mL/hr at 11/02/19 0834  . cefTRIAXone (ROCEPHIN)  IV 200 mL/hr at 11/01/19 1800  . metronidazole 500 mg (11/02/19 0556)  . vancomycin 1,000 mg (11/02/19 0957)    LOS: 4 days   Time spent: [place], MD [facility]  11/02/2019, 10:22 AM

## 2019-11-03 ENCOUNTER — Encounter (HOSPITAL_COMMUNITY): Payer: Self-pay | Admitting: Vascular Surgery

## 2019-11-03 LAB — BASIC METABOLIC PANEL
Anion gap: 8 (ref 5–15)
BUN: 27 mg/dL — ABNORMAL HIGH (ref 8–23)
CO2: 21 mmol/L — ABNORMAL LOW (ref 22–32)
Calcium: 10.1 mg/dL (ref 8.9–10.3)
Chloride: 107 mmol/L (ref 98–111)
Creatinine, Ser: 1.45 mg/dL — ABNORMAL HIGH (ref 0.44–1.00)
GFR calc Af Amer: 39 mL/min — ABNORMAL LOW (ref >60)
GFR calc non Af Amer: 33 mL/min — ABNORMAL LOW (ref >60)
Glucose, Bld: 107 mg/dL — ABNORMAL HIGH (ref 70–99)
Potassium: 3.9 mmol/L (ref 3.5–5.1)
Sodium: 136 mmol/L (ref 135–145)

## 2019-11-03 LAB — CULTURE, BLOOD (ROUTINE X 2)
Culture: NO GROWTH
Culture: NO GROWTH

## 2019-11-03 LAB — CBC WITH DIFFERENTIAL/PLATELET
Abs Immature Granulocytes: 1.69 10*3/uL — ABNORMAL HIGH (ref 0.00–0.07)
Basophils Absolute: 0.1 10*3/uL (ref 0.0–0.1)
Basophils Relative: 0 %
Eosinophils Absolute: 0.2 10*3/uL (ref 0.0–0.5)
Eosinophils Relative: 1 %
HCT: 22.1 % — ABNORMAL LOW (ref 36.0–46.0)
Hemoglobin: 7.4 g/dL — ABNORMAL LOW (ref 12.0–15.0)
Immature Granulocytes: 7 %
Lymphocytes Relative: 10 %
Lymphs Abs: 2.3 10*3/uL (ref 0.7–4.0)
MCH: 29.5 pg (ref 26.0–34.0)
MCHC: 33.5 g/dL (ref 30.0–36.0)
MCV: 88 fL (ref 80.0–100.0)
Monocytes Absolute: 1.9 10*3/uL — ABNORMAL HIGH (ref 0.1–1.0)
Monocytes Relative: 8 %
Neutro Abs: 16.8 10*3/uL — ABNORMAL HIGH (ref 1.7–7.7)
Neutrophils Relative %: 74 %
Platelets: 400 10*3/uL (ref 150–400)
RBC: 2.51 MIL/uL — ABNORMAL LOW (ref 3.87–5.11)
RDW: 14.8 % (ref 11.5–15.5)
WBC: 23 10*3/uL — ABNORMAL HIGH (ref 4.0–10.5)
nRBC: 0 % (ref 0.0–0.2)

## 2019-11-03 LAB — GLUCOSE, CAPILLARY
Glucose-Capillary: 66 mg/dL — ABNORMAL LOW (ref 70–99)
Glucose-Capillary: 92 mg/dL (ref 70–99)
Glucose-Capillary: 183 mg/dL — ABNORMAL HIGH (ref 70–99)
Glucose-Capillary: 147 mg/dL — ABNORMAL HIGH (ref 70–99)
Glucose-Capillary: 104 mg/dL — ABNORMAL HIGH (ref 70–99)

## 2019-11-03 MED ORDER — INSULIN GLARGINE 100 UNIT/ML ~~LOC~~ SOLN
28.0000 [IU] | Freq: Every day | SUBCUTANEOUS | Status: DC
  Administered 2019-11-03 – 2019-11-04 (×2): 28 [IU] via SUBCUTANEOUS
  Filled 2019-11-03 (×2): qty 0.28

## 2019-11-03 NOTE — Progress Notes (Signed)
Hospitalist progress note  If 7PM-7AM,  night-coverage-look on AMION -prefer pages-not epic chat,please  Monique Cabrera  DJS:[id number] DOB: 1935/02/11 DOA: 10/29/2019 PCP: [name], DO  Narrative:  84 y/o home dwelling [place] fem htn, HLD, PAD, DM ty ii, CVA on plavix, CKD iii, EF 60-65% 2/20187 Prior CVA 01/2017 c L Internuclear ophthalmoplegia Admitted 10/29/19 with some slurred speech 2/2 low CBG admitted to rule out CVA so was brought to the ED for work-up Cardiolgy consulted 2/2 Crtiical ischemia of RLE-started on IV Vanc and ceftriaxone  Assessment & Plan:  Critical R foot ischemia with osteomyelitis and leukocytosis of 23 -cont vanc flagyll ceftriaxone and defer duration to surgeon-may be able to discontinue in the next day or so -aortogram with runoff from 11 4 showing no target, status post R BKA 11/6-pain control Norco 5/325 1-2 every 4 as needed-morphine 2-4 for severe pain and has been encouraged by surgeon to take the meds as he was having increasing pain this morning  DM ty ii-a1c this admit 8.9-, home dose 48 units Low blood sugar of 66 up to 92 cut back dose to 28 units and monitor trends Can saline lock 11/7  htn continue coreg 25 bid, amlodipine 10 daily as well as as as needed Lopressor heart rate above 130  Mild hypokalemia-resolved not on replacement  hyponatremia-much improved-saline as below  Anemia 2/2 CKD-given 1 U PRBC 11/3-iron level low at 14 and saturations borderline--Feraheme to be given 11/5 Hemoglobin ranging between the 7 and 8 range  ckd iiib with mild met acidosis-Diovan DC-BUN/creatinine seems to range between 1.3 and 1.5, saline lock today, monitor trends  Probable multi-infarct dementia and some behavioural issues-at times confused not aware of plan-re-orient  DVT prophylaxis: lovenox   Code Status:   full    Family Communication:   none at the bedside Disposition Plan: await Arotogram  Consultants:   Cardiology   Vascular  surgery Procedures:  Foot xray 11/2 1. Right foot infection with soft tissue swelling and soft tissue air along the dorsal lateral aspect of the foot as described. 2. Possible small focus of osteomyelitis along the plantar lateral aspect of the cuboid with another possible area of osteomyelitis along medial base of the proximal phalanx of the fifth toe. No other evidence of osteomyelitis.  Other: None.  CT head 11/2 1.  No acute intracranial findings.  2.  Chronic microvascular ischemic change and cerebral volume loss.  Antimicrobials:   As above   Subjective:  Awake alert coherent no distress Nursing reported some low blood sugars earlier this morning tolerating diet meds adjusted Pain is better controlled as she is taking pain meds She seems reluctant to consider skilled placement I have told her this is only short-term  Objective: Vitals:   11/03/19 0045 11/03/19 0456 11/03/19 0828 11/03/19 0900  BP: (!) 126/96 (!) 147/58 128/63 (!) 143/22  Pulse: 61 67    Resp:      Temp: 97.6 F (36.4 C) 98.2 F (36.8 C)  98.2 F (36.8 C)  TempSrc: Oral Oral  Oral  SpO2: 95% 93%  97%  Weight:      Height:        Intake/Output Summary (Last 24 hours) at 11/03/2019 1041 Last data filed at 11/03/2019 0819 Gross per 24 hour  Intake 2428.73 ml  Output 700 ml  Net 1728.73 ml   Filed Weights   10/31/19 0309 11/01/19 0640 11/02/19 1324  Weight: 60.2 kg 60.4 kg 60.4 kg  Examination: Awake coherent no distress S1-S2 no murmur Chest clear without added sound Abdomen soft Right BKA noted and stump wrapped  Data Reviewed: I have personally reviewed following labs and imaging studies CBC: Recent Labs  Lab 10/30/19 0552 10/31/19 0404 11/01/19 0403 11/02/19 0521 11/03/19 0254  WBC 25.3* 26.1* 23.1* 27.4* 23.0*  NEUTROABS  --  21.2* 18.7* 24.9* 16.8*  HGB 6.6* 8.2* 7.3* 7.7* 7.4*  HCT 19.7* 23.9* 21.7* 22.9* 22.1*  MCV 86.4 85.4 85.1 86.7 88.0  PLT 511* 457* 407*  447* 161   Basic Metabolic Panel: Recent Labs  Lab 10/29/19 2336 10/30/19 0552 10/31/19 0404 11/01/19 0403 11/02/19 0521 11/03/19 0254  NA  --  129* 132* 132* 134* 136  K  --  3.6 3.3* 3.9 3.6 3.9  CL  --  99 102 103 103 107  CO2  --  20* 19* 19* 21* 21*  GLUCOSE  --  78 124* 219* 147* 107*  BUN  --  25* 21 24* 29* 27*  CREATININE  --  1.27* 1.29* 1.37* 1.53* 1.45*  CALCIUM  --  10.5* 10.2 9.9 10.5* 10.1  MG 1.6*  --  1.8 1.7  --   --   PHOS 2.1*  --   --  1.8*  --   --    GFR: Estimated Creatinine Clearance: 26.9 mL/min (A) (by C-G formula based on SCr of 1.45 mg/dL (H)). Liver Function Tests: Recent Labs  Lab 10/29/19 1309 10/31/19 0404 11/01/19 0403  AST 26 25  --   ALT 19 17  --   ALKPHOS 68 82  --   BILITOT 1.1 0.7  --   PROT 6.3* 5.9*  --   ALBUMIN 1.9* 1.7* 1.5*   No results for input(s): LIPASE, AMYLASE in the last 168 hours. No results for input(s): AMMONIA in the last 168 hours. Coagulation Profile: Recent Labs  Lab 10/29/19 2336  INR 1.3*   Cardiac Enzymes: Radiology Studies: Reviewed images personally in health database  Scheduled Meds: . amLODipine  10 mg Oral Daily  . aspirin  81 mg Oral Daily  . atorvastatin  20 mg Oral Daily  . carvedilol  25 mg Oral BID WC  . clopidogrel  75 mg Oral Daily  . feeding supplement (ENSURE ENLIVE)  237 mL Oral TID BM  . feeding supplement (PRO-STAT SUGAR FREE 64)  30 mL Oral BID WC  . heparin  5,000 Units Subcutaneous Q8H  . insulin aspart  0-9 Units Subcutaneous TID WC  . insulin glargine  28 Units Subcutaneous Daily  . multivitamin with minerals  1 tablet Oral Daily  . mupirocin ointment  1 application Nasal BID  . sodium chloride flush  3 mL Intravenous Q12H   Continuous Infusions: . sodium chloride Stopped (11/01/19 1758)  . sodium chloride 75 mL/hr at 11/03/19 0625  . cefTRIAXone (ROCEPHIN)  IV Stopped (11/02/19 1904)  . metronidazole 500 mg (11/03/19 0627)  . vancomycin Stopped (11/02/19 1057)     LOS: 5 days   Time spent: [place], MD [facility]  11/03/2019, 10:41 AM

## 2019-11-03 NOTE — Progress Notes (Signed)
  Progress Note    11/03/2019 7:46 AM 1 Day Post-Op  Subjective: Reports pain in her right below-knee amputation site   Vitals:   11/03/19 0045 11/03/19 0456  BP: (!) 126/96 (!) 147/58  Pulse: 61 67  Resp:    Temp: 97.6 F (36.4 C) 98.2 F (36.8 C)  SpO2: 95% 93%   Physical Exam: BKA dressing intact  CBC    Component Value Date/Time   WBC 23.0 (H) 11/03/2019 0254   RBC 2.51 (L) 11/03/2019 0254   HGB 7.4 (L) 11/03/2019 0254   HGB 8.6 (L) 10/22/2019 1056   HCT 22.1 (L) 11/03/2019 0254   HCT 26.7 (L) 10/22/2019 1056   PLT 400 11/03/2019 0254   PLT 508 (H) 10/22/2019 1056   MCV 88.0 11/03/2019 0254   MCV 91 10/22/2019 1056   MCH 29.5 11/03/2019 0254   MCHC 33.5 11/03/2019 0254   RDW 14.8 11/03/2019 0254   RDW 12.0 10/22/2019 1056   LYMPHSABS 2.3 11/03/2019 0254   LYMPHSABS 1.6 10/22/2019 1056   MONOABS 1.9 (H) 11/03/2019 0254   EOSABS 0.2 11/03/2019 0254   EOSABS 0.1 10/22/2019 1056   BASOSABS 0.1 11/03/2019 0254   BASOSABS 0.1 10/22/2019 1056    BMET    Component Value Date/Time   NA 136 11/03/2019 0254   NA 128 (L) 10/22/2019 1056   K 3.9 11/03/2019 0254   CL 107 11/03/2019 0254   CO2 21 (L) 11/03/2019 0254   GLUCOSE 107 (H) 11/03/2019 0254   BUN 27 (H) 11/03/2019 0254   BUN 17 10/22/2019 1056   CREATININE 1.45 (H) 11/03/2019 0254   CREATININE 1.71 (H) 09/17/2019 1518   CALCIUM 10.1 11/03/2019 0254   GFRNONAA 33 (L) 11/03/2019 0254   GFRNONAA 27 (L) 09/17/2019 1518   GFRAA 39 (L) 11/03/2019 0254   GFRAA 32 (L) 09/17/2019 1518    INR    Component Value Date/Time   INR 1.3 (H) 10/29/2019 2336     Intake/Output Summary (Last 24 hours) at 11/03/2019 0746 Last data filed at 11/03/2019 0507 Gross per 24 hour  Intake 2425.73 ml  Output 700 ml  Net 1725.73 ml     Assessment/Plan:  84 y.o. female postop day 1.  Encourage the patient to take pain meds.  Discussed with nurse to encourage pain medication.  Will take down her dressing  tomorrow.     Rosetta Posner, MD [facility] [phone] 11/03/2019 7:46 AM Patient ID: Monique Cabrera, female   DOB: [birth year], 84 y.o.   MRN: [id number]

## 2019-11-03 NOTE — Progress Notes (Signed)
CSW acknowledges consult for SNF. OT/PT is recommending CIR at this time. Please re-consult if disposition plan changes. Consult is being closed out.

## 2019-11-03 NOTE — Plan of Care (Signed)
  Problem: Health Behavior/Discharge Planning: Goal: Ability to manage health-related needs will improve Outcome: Progressing   Problem: Clinical Measurements: Goal: Will remain free from infection Outcome: Progressing   Problem: Pain Managment: Goal: General experience of comfort will improve Outcome: Progressing   Problem: Safety: Goal: Ability to remain free from injury will improve Outcome: Progressing

## 2019-11-03 NOTE — Anesthesia Postprocedure Evaluation (Signed)
Anesthesia Post Note  Patient: Monique Cabrera  Procedure(s) Performed: AMPUTATION BELOW KNEE RIGHT (Right Knee)     Patient location during evaluation: PACU Anesthesia Type: General and Regional Level of consciousness: awake and alert Pain management: pain level controlled Vital Signs Assessment: post-procedure vital signs reviewed and stable Respiratory status: spontaneous breathing, nonlabored ventilation, respiratory function stable and patient connected to nasal cannula oxygen Cardiovascular status: blood pressure returned to baseline and stable Postop Assessment: no apparent nausea or vomiting Anesthetic complications: no    Last Vitals:  Vitals:   11/03/19 0045 11/03/19 0456  BP: (!) 126/96 (!) 147/58  Pulse: 61 67  Resp:    Temp: 36.4 C 36.8 C  SpO2: 95% 93%    Last Pain:  Vitals:   11/03/19 0456  TempSrc: Oral  PainSc:                  Barnet Glasgow

## 2019-11-03 NOTE — Evaluation (Signed)
Physical Therapy Evaluation Patient Details Name: Monique Cabrera MRN: [id number] DOB: 1935-01-27 Today's Date: 11/03/2019   History of Present Illness  Monique Cabrera is a 84 y.o. female with medical history significant of HTN, HLD, PAD, DM type 2, CVA on plavix, CKD stage 3 chronic anemia. Presented to the ED with critical ischemia right foot. Pt now s/p right transtibial amputation.    Clinical Impression  Pt presented supine in bed with HOB elevated, awake and willing to participate in therapy session. Prior to admission, pt reported that she ambulated with use of a cane and was independent with ADLs. However, recently her R foot had been bothering her so much that she had resulted to crawling around her home. Pt lives alone but has her son and grandson with her 24/7. She lives in a single level apartment with a level entry. At the time of evaluation, pt at supervision level for bed mobility and min A x2 for transfers. Pt is an excellent candidate for CIR as she is highly motivated and has great family support. Pt would continue to benefit from skilled physical therapy services at this time while admitted and after d/c to address the below listed limitations in order to improve overall safety and independence with functional mobility.     Follow Up Recommendations CIR    Equipment Recommendations  Rolling walker with 5" wheels    Recommendations for Other Services Rehab consult     Precautions / Restrictions Precautions Precautions: Fall Restrictions Weight Bearing Restrictions: Yes RLE Weight Bearing: Non weight bearing      Mobility  Bed Mobility Overal bed mobility: Needs Assistance Bed Mobility: Supine to Sit     Supine to sit: Supervision;HOB elevated     General bed mobility comments: supervision for safety no physical assistance provided  Transfers Overall transfer level: Needs assistance Equipment used: Rolling walker (2 wheeled) Transfers: Sit to/from  [facility] Sit to Stand: Min assist;+2 safety/equipment;+2 physical assistance Stand pivot transfers: Min assist;+2 physical assistance;+2 safety/equipment       General transfer comment: bed in elevated position, assistance needed to power into standing and for stability with transitional movement. Pt initially able to take a few small hops on L LE but then quickly fatiguing and pivoting on L foot to transfer to chair  Ambulation/Gait                Stairs            Wheelchair Mobility    Modified Rankin (Stroke Patients Only)       Balance Overall balance assessment: Needs assistance Sitting-balance support: No upper extremity supported;Feet supported Sitting balance-Leahy Scale: Fair     Standing balance support: Bilateral upper extremity supported;During functional activity Standing balance-Leahy Scale: Poor Standing balance comment: reliant on BUE support on RW                             Pertinent Vitals/Pain Pain Assessment: 0-10 Pain Score: 7  Pain Location: R residual limb Pain Descriptors / Indicators: Sore Pain Intervention(s): Monitored during session;Repositioned    Home Living Family/patient expects to be discharged to:: Private residence Living Arrangements: Alone Available Help at Discharge: Family;Available 24 hours/day Type of Home: Apartment Home Access: Level entry     Home Layout: One level Home Equipment: Bedside commode;Cane - single point;Crutches;Shower seat;Wheelchair - manual Additional Comments: pts son and grandson are able to be there 24/7  Prior Function Level of Independence: Independent with assistive device(s)         Comments: used a cane for mobility;pt reports she had been crawling around at pain due to increased pain      Hand Dominance   Dominant Hand: Right    Extremity/Trunk Assessment   Upper Extremity Assessment Upper Extremity Assessment: Overall WFL for tasks  assessed    Lower Extremity Assessment Lower Extremity Assessment: RLE deficits/detail RLE Deficits / Details: s/p transtibial amputation with ACE wrap in place to distal aspect; able to fully extend and flex knee    Cervical / Trunk Assessment Cervical / Trunk Assessment: Normal  Communication   Communication: No difficulties  Cognition Arousal/Alertness: Awake/alert Behavior During Therapy: WFL for tasks assessed/performed Overall Cognitive Status: Within Functional Limits for tasks assessed                                        General Comments General comments (skin integrity, edema, etc.): vss;educated pt on importance for positioning and ROM of R knee in preparation for prosthesis    Exercises     Assessment/Plan    PT Assessment Patient needs continued PT services  PT Problem List Decreased strength;Decreased range of motion;Decreased activity tolerance;Decreased mobility;Decreased coordination;Decreased balance;Decreased knowledge of use of DME;Decreased safety awareness;Decreased knowledge of precautions;Pain       PT Treatment Interventions DME instruction;Gait training;Functional mobility training;Stair training;Therapeutic activities;Therapeutic exercise;Balance training;Neuromuscular re-education;Patient/family education    PT Goals (Current goals can be found in the Care Plan section)  Acute Rehab PT Goals Patient Stated Goal: to get stronger and go home PT Goal Formulation: With patient Time For Goal Achievement: 11/17/19 Potential to Achieve Goals: Good    Frequency Min 4X/week   Barriers to discharge        Co-evaluation PT/OT/SLP Co-Evaluation/Treatment: Yes Reason for Co-Treatment: For patient/therapist safety;To address functional/ADL transfers PT goals addressed during session: Mobility/safety with mobility;Balance;Proper use of DME;Strengthening/ROM OT goals addressed during session: ADL's and self-care       AM-PAC PT "6  Clicks" Mobility  Outcome Measure Help needed turning from your back to your side while in a flat bed without using bedrails?: None Help needed moving from lying on your back to sitting on the side of a flat bed without using bedrails?: None Help needed moving to and from a bed to a chair (including a wheelchair)?: A Lot Help needed standing up from a chair using your arms (e.g., wheelchair or bedside chair)?: A Lot Help needed to walk in hospital room?: A Lot Help needed climbing 3-5 steps with a railing? : Total 6 Click Score: 15    End of Session Equipment Utilized During Treatment: Gait belt Activity Tolerance: Patient tolerated treatment well Patient left: in chair;with call bell/phone within reach;with chair alarm set Nurse Communication: Mobility status PT Visit Diagnosis: Other abnormalities of gait and mobility (R26.89)    Time: 4818-5631 PT Time Calculation (min) (ACUTE ONLY): 18 min   Charges:   PT Evaluation $PT Eval Moderate Complexity: 1 Mod          Eduard Clos, PT, DPT  Acute Rehabilitation Services Pager [phone] Office [place] 11/03/2019, 12:39 PM

## 2019-11-03 NOTE — Progress Notes (Signed)
Hypoglycemic Event  CBG: 66  Treatment: juice administered, hypoglycemic protocol started  Symptoms: none exhibiting  Follow-up CBG: Time:0751 CBG Result:92  Comments/MD notified: Nita Sells, MD   Monique Cabrera

## 2019-11-03 NOTE — Progress Notes (Signed)
Occupational Therapy Evaluation Patient Details Name: Monique Cabrera MRN: [id number] DOB: July 03, 1935 Today's Date: 11/03/2019    History of Present Illness Monique Cabrera is a 84 y.o. female with medical history significant of HTN, HLD, PAD, DM type 2, CVA on plavix, CKD stage 3 chronic anemia. Presented to the ED with critical ischemia right foot. S/p right BKA.   Clinical Impression   PTA, pt was living at home with her son and grandson who are able to provide 24/7 support, pt reports she was independent with ADL/IADL and functional mobility, but had recently been crawling due to pain in her RLE. Pt currently requires minA+2 for safety/equipment for stand-pivot from the edge of bed to the recliner at RW level. Pt requires minA for LB dressing. Due to decline in current level of function, pt would benefit from acute OT to address established goals to facilitate safe D/C to venue listed below. At this time, recommend CIR follow-up. Will continue to follow acutely.     Follow Up Recommendations  CIR    Equipment Recommendations  3 in 1 bedside commode(RW)    Recommendations for Other Services Rehab consult     Precautions / Restrictions Precautions Precautions: Fall Restrictions Weight Bearing Restrictions: Yes RLE Weight Bearing: Non weight bearing      Mobility Bed Mobility Overal bed mobility: Needs Assistance Bed Mobility: Supine to Sit     Supine to sit: Supervision;HOB elevated     General bed mobility comments: supervision for safety no physical assistance provided  Transfers Overall transfer level: Needs assistance Equipment used: Rolling walker (2 wheeled) Transfers: Sit to/from [facility] Sit to Stand: Min assist;+2 safety/equipment;+2 physical assistance Stand pivot transfers: Min assist;+2 physical assistance;+2 safety/equipment       General transfer comment: minA+2 for stand-pivot from EOB toward Left side;pt able to hop on LLE      Balance Overall balance assessment: Needs assistance Sitting-balance support: No upper extremity supported;Feet supported Sitting balance-Leahy Scale: Fair     Standing balance support: Bilateral upper extremity supported;During functional activity Standing balance-Leahy Scale: Poor Standing balance comment: reliant on BUE support on RW                           ADL either performed or assessed with clinical judgement   ADL Overall ADL's : Needs assistance/impaired Eating/Feeding: Set up;Sitting   Grooming: Set up;Sitting   Upper Body Bathing: Min guard;Sitting   Lower Body Bathing: Minimal assistance;+2 for physical assistance;Sit to/from stand   Upper Body Dressing : Min guard;Sitting   Lower Body Dressing: Minimal assistance;Sit to/from stand;+2 for physical assistance   Toilet Transfer: Minimal assistance;+2 for physical assistance;+2 for safety/equipment;Stand-pivot;RW Toilet Transfer Details (indicate cue type and reason): simulated from EOB to recliner Toileting- Clothing Manipulation and Hygiene: Minimal assistance;+2 for safety/equipment;+2 for physical assistance       Functional mobility during ADLs: Minimal assistance;+2 for physical assistance;+2 for safety/equipment;Rolling walker       Vision         Perception     Praxis      Pertinent Vitals/Pain Pain Assessment: 0-10 Pain Score: 7  Pain Location: RLE Pain Descriptors / Indicators: Sore Pain Intervention(s): Limited activity within patient's tolerance;Monitored during session     Hand Dominance Right   Extremity/Trunk Assessment Upper Extremity Assessment Upper Extremity Assessment: Overall WFL for tasks assessed   Lower Extremity Assessment Lower Extremity Assessment: RLE deficits/detail;Defer to PT evaluation RLE Deficits / Details:  s/p BKA;able to fully extend knee   Cervical / Trunk Assessment Cervical / Trunk Assessment: Normal   Communication  Communication Communication: No difficulties   Cognition Arousal/Alertness: Awake/alert Behavior During Therapy: WFL for tasks assessed/performed Overall Cognitive Status: Within Functional Limits for tasks assessed                                     General Comments  vss;educated pt on importance for positioning and ROM of R knee in preparation for prosthesis    Exercises     Shoulder Instructions      Home Living Family/patient expects to be discharged to:: Private residence Living Arrangements: Alone Available Help at Discharge: Family;Available 24 hours/day Type of Home: Apartment Home Access: Level entry     Home Layout: One level     Bathroom Shower/Tub: Teacher, early years/pre: Standard     Home Equipment: Bedside commode;Cane - single point;Crutches;Shower seat;Wheelchair - manual   Additional Comments: pts son and grandson are able to be there 24/7      Prior Functioning/Environment Level of Independence: Independent with assistive device(s)        Comments: used a cane for mobility;pt reports she had been crawling around at pain due to increased pain         OT Problem List: Decreased activity tolerance;Impaired balance (sitting and/or standing);Decreased safety awareness;Decreased knowledge of use of DME or AE;Pain      OT Treatment/Interventions: Self-care/ADL training;Therapeutic exercise;DME and/or AE instruction;Therapeutic activities;Patient/family education;Balance training    OT Goals(Current goals can be found in the care plan section) Acute Rehab OT Goals Patient Stated Goal: to get stronger and go home OT Goal Formulation: With patient Time For Goal Achievement: 11/17/19 Potential to Achieve Goals: Good ADL Goals Pt Will Perform Grooming: with modified independence;standing;sitting Pt Will Perform Upper Body Dressing: with modified independence;sitting;standing Pt Will Perform Lower Body Dressing: with modified  independence;sit to/from stand Pt Will Transfer to Toilet: with modified independence;ambulating Pt Will Perform Toileting - Clothing Manipulation and hygiene: with modified independence;sit to/from stand  OT Frequency: Min 2X/week   Barriers to D/C: Decreased caregiver support          Co-evaluation PT/OT/SLP Co-Evaluation/Treatment: Yes Reason for Co-Treatment: For patient/therapist safety;To address functional/ADL transfers   OT goals addressed during session: ADL's and self-care      AM-PAC OT "6 Clicks" Daily Activity     Outcome Measure Help from another person eating meals?: A Little Help from another person taking care of personal grooming?: A Little Help from another person toileting, which includes using toliet, bedpan, or urinal?: A Lot Help from another person bathing (including washing, rinsing, drying)?: A Little Help from another person to put on and taking off regular upper body clothing?: A Little Help from another person to put on and taking off regular lower body clothing?: A Lot 6 Click Score: 16   End of Session Equipment Utilized During Treatment: Gait belt;Rolling walker Nurse Communication: Mobility status  Activity Tolerance: Patient tolerated treatment well Patient left: in chair;with call bell/phone within reach;with chair alarm set  OT Visit Diagnosis: Other abnormalities of gait and mobility (R26.89);Unsteadiness on feet (R26.81);Pain Pain - Right/Left: Right Pain - part of body: Leg                Time: 6433-2951 OT Time Calculation (min): 17 min Charges:  OT General Charges $OT Visit: 1 Visit  OT Evaluation $OT Eval Moderate Complexity: [place] OTR/L Acute Rehabilitation Services Office: [place] 11/03/2019, 10:49 AM

## 2019-11-04 LAB — BASIC METABOLIC PANEL
Anion gap: 10 (ref 5–15)
BUN: 23 mg/dL (ref 8–23)
CO2: 20 mmol/L — ABNORMAL LOW (ref 22–32)
Calcium: 9.9 mg/dL (ref 8.9–10.3)
Chloride: 103 mmol/L (ref 98–111)
Creatinine, Ser: 1.2 mg/dL — ABNORMAL HIGH (ref 0.44–1.00)
GFR calc Af Amer: 49 mL/min — ABNORMAL LOW (ref >60)
GFR calc non Af Amer: 42 mL/min — ABNORMAL LOW (ref >60)
Glucose, Bld: 138 mg/dL — ABNORMAL HIGH (ref 70–99)
Potassium: 3.9 mmol/L (ref 3.5–5.1)
Sodium: 133 mmol/L — ABNORMAL LOW (ref 135–145)

## 2019-11-04 LAB — CBC
HCT: 23.7 % — ABNORMAL LOW (ref 36.0–46.0)
Hemoglobin: 7.8 g/dL — ABNORMAL LOW (ref 12.0–15.0)
MCH: 28.6 pg (ref 26.0–34.0)
MCHC: 32.9 g/dL (ref 30.0–36.0)
MCV: 86.8 fL (ref 80.0–100.0)
Platelets: 443 10*3/uL — ABNORMAL HIGH (ref 150–400)
RBC: 2.73 MIL/uL — ABNORMAL LOW (ref 3.87–5.11)
RDW: 14.9 % (ref 11.5–15.5)
WBC: 19.1 10*3/uL — ABNORMAL HIGH (ref 4.0–10.5)
nRBC: 0.4 % — ABNORMAL HIGH (ref 0.0–0.2)

## 2019-11-04 LAB — GLUCOSE, CAPILLARY
Glucose-Capillary: 126 mg/dL — ABNORMAL HIGH (ref 70–99)
Glucose-Capillary: 78 mg/dL (ref 70–99)

## 2019-11-04 MED ORDER — INSULIN GLARGINE 100 UNIT/ML ~~LOC~~ SOLN: 28.0000 [IU] | Freq: Every day | SUBCUTANEOUS | 11 refills | Status: DC

## 2019-11-04 MED ORDER — ASPIRIN 81 MG PO CHEW: 81.0000 mg | CHEWABLE_TABLET | Freq: Every day | ORAL | Status: DC

## 2019-11-04 MED ORDER — HYDROCODONE-ACETAMINOPHEN 5-325 MG PO TABS: 1.0000 | ORAL_TABLET | ORAL | 0 refills | Status: DC | PRN

## 2019-11-04 NOTE — TOC Transition Note (Signed)
Transition of Care [facility]) - CM/SW Discharge Note   Patient Details  Name: Monique Cabrera MRN: [id number] Date of Birth: 1935/07/13  Transition of Care [facility]) CM/SW Contact:  Claudie Leach, RN Phone Number: [phone] 11/04/2019, 12:02 PM   Clinical Narrative:    Pt to d/c home to 24 hour of her children, who take turns staying with her.  Per daughter, patient has 3n1.  She was using a borrowed, old, WC that is not adequate for her apartment.  She needs a new WC and RW.    They have no preference of [place] agency. Medicare list presented to daughter.  Daughter requested the highest rated agency that will accept patient's insurance.  [facility] declined.  [facility] is able to take patient referral and will start her care tomorrow.    WC and RW will be delivered to room prior to d/c.    Final next level of care: [place] Barriers to Discharge: No Barriers Identified   Patient Goals and CMS Choice Patient states their goals for this hospitalization and ongoing recovery are:: go home CMS Medicare.gov Compare Post Acute Care list provided to:: Patient Represenative (must comment)(daughter, Jody) Choice offered to / list presented to : Adult Children   Discharge Plan and Services In-house Referral: Clinical Social Work Discharge Planning Services: CM Consult            DME Arranged: Gilford Rile rolling, Youth worker wheelchair with seat cushion DME Agency: [employer] Date DME Agency Contacted: 11/04/19 Time DME Agency Contacted: 46 Representative spoke with at DME Agency: [place] [facility] Arranged: PT, OT, RN, Nurse's Aide, Social Work [employer] Agency: [place] ([place]) Date [place]: 11/04/19 Time [place]: 1202 Representative spoke with at [place]: [place] ([place]) Interventions     Readmission Risk Interventions Readmission Risk Prevention Plan 11/02/2019  Transportation Screening  Complete  PCP or Specialist Appt within 3-5 Days Complete  HRI or [place] Complete  Social Work Consult for [place] Planning/Counseling [place] Not Applicable  Medication Review Press photographer) Referral to Pharmacy  Some recent data might be hidden

## 2019-11-04 NOTE — Progress Notes (Signed)
Patient ID: Monique Cabrera, female   DOB: April 19, 1935, 84 y.o.   MRN: [id number]  Progress Note    11/04/2019 8:44 AM 2 Days Post-Op  Subjective: Very emotional this morning.  Wants to go home from the hospital.  Does not want to consider CIR or SNF rehab.   Vitals:   11/03/19 1913 11/04/19 0345  BP: 135/64 135/65  Pulse: 68 66  Resp: 18 16  Temp: 98.3 F (36.8 C) 98.2 F (36.8 C)  SpO2: 100%    Physical Exam: Dressing removed from below-knee amputation.  Excellent healing with no evidence of infection.  CBC    Component Value Date/Time   WBC 23.0 (H) 11/03/2019 0254   RBC 2.51 (L) 11/03/2019 0254   HGB 7.4 (L) 11/03/2019 0254   HGB 8.6 (L) 10/22/2019 1056   HCT 22.1 (L) 11/03/2019 0254   HCT 26.7 (L) 10/22/2019 1056   PLT 400 11/03/2019 0254   PLT 508 (H) 10/22/2019 1056   MCV 88.0 11/03/2019 0254   MCV 91 10/22/2019 1056   MCH 29.5 11/03/2019 0254   MCHC 33.5 11/03/2019 0254   RDW 14.8 11/03/2019 0254   RDW 12.0 10/22/2019 1056   LYMPHSABS 2.3 11/03/2019 0254   LYMPHSABS 1.6 10/22/2019 1056   MONOABS 1.9 (H) 11/03/2019 0254   EOSABS 0.2 11/03/2019 0254   EOSABS 0.1 10/22/2019 1056   BASOSABS 0.1 11/03/2019 0254   BASOSABS 0.1 10/22/2019 1056    BMET    Component Value Date/Time   NA 133 (L) 11/04/2019 0459   NA 128 (L) 10/22/2019 1056   K 3.9 11/04/2019 0459   CL 103 11/04/2019 0459   CO2 20 (L) 11/04/2019 0459   GLUCOSE 138 (H) 11/04/2019 0459   BUN 23 11/04/2019 0459   BUN 17 10/22/2019 1056   CREATININE 1.20 (H) 11/04/2019 0459   CREATININE 1.71 (H) 09/17/2019 1518   CALCIUM 9.9 11/04/2019 0459   GFRNONAA 42 (L) 11/04/2019 0459   GFRNONAA 27 (L) 09/17/2019 1518   GFRAA 49 (L) 11/04/2019 0459   GFRAA 32 (L) 09/17/2019 1518    INR    Component Value Date/Time   INR 1.3 (H) 10/29/2019 2336     Intake/Output Summary (Last 24 hours) at 11/04/2019 0844 Last data filed at 11/04/2019 0345 Gross per 24 hour  Intake 780 ml  Output 300 ml  Net  480 ml     Assessment/Plan:  84 y.o. female stable postop day 2 from right below-knee amputation due to foot gangrene.  I discussed options of inpatient physical therapy and rehab versus outpatient.  Also discussed this by telephone with her daughter Mable Fill.  Her daughter states that they understand and are capable of caring for the patient at home.  There apparently are several daughters who work in the home to help care for her.  I feel that it is safe for her to be discharged to home today.  Also discussed this with Dr.Samtani.  I will see her in the office in 3 weeks for wound check and staple removal     Rosetta Posner, MD [facility] [phone] 11/04/2019 8:44 AM

## 2019-11-04 NOTE — Care Management (Signed)
Patient suffers from right BKA which impairs their ability to perform daily activities like moving from room to room, toileting, dressing, feeding in the home. A walker will not resolve  issue with performing activities of daily living. A wheelchair will allow patient to safely perform daily activities. Patient is not able to propel themselves in the home using a standard weight wheelchair due to weakness. Patient can self propel in the lightweight wheelchair. Length of need lifetime.  Accessories: elevating leg rests (ELRs), wheel locks, extensions, back cushion and anti-tippers.

## 2019-11-04 NOTE — Plan of Care (Signed)
  Problem: Clinical Measurements: Goal: Will remain free from infection Outcome: Progressing   Problem: Pain Managment: Goal: General experience of comfort will improve Outcome: Progressing   Problem: Safety: Goal: Ability to remain free from injury will improve Outcome: Progressing   

## 2019-11-04 NOTE — Discharge Summary (Signed)
Physician Discharge Summary  Monique Cabrera UXL:[id number] DOB: 1935-04-15 DOA: 10/29/2019  PCP: [name], DO  Admit date: 10/29/2019 Discharge date: 11/04/2019  Time spent: 25 minutes  Recommendations for Outpatient Follow-up:  1. Discontinued Diovan and cut back dose of Lantus this admission limited prescription of pain meds given for BKA pain 2. Needs labs in about 1 week 3. Recommend outpatient follow-up Dr. Donnetta Hutching vascular surgery for BKA staple removal etc. 4. Does require in-home therapy which has been ordered in addition to social worker and case they change their minds and want to go to skilled  Discharge Diagnoses:  Active Problems:   Essential hypertension, benign   Atherosclerosis of native artery of extremity (HCC)   Chronic kidney disease (CKD), stage II (mild)   Peripheral arterial disease (HCC)   Anemia   Hypertension   Atherosclerosis of native artery of extremity with intermittent claudication (HCC)   Type II diabetes mellitus with renal manifestations, uncontrolled (HCC)   Chronic hepatitis C without hepatic coma (HCC)   Hyperlipidemia   Osteomyelitis (HCC)   Hyponatremia   Chronic diastolic CHF (congestive heart failure) (HCC)   Gangrene of right foot [name])   Discharge Condition: Improved  Diet recommendation: Heart healthy  Filed Weights   10/31/19 0309 11/01/19 0640 11/02/19 1324  Weight: 60.2 kg 60.4 kg 60.4 kg    History of present illness:  84 y/o home dwelling BLk fem htn, HLD, PAD, DM ty ii, CVA on plavix, CKD iii, EF 60-65% 2/20187 Prior CVA 01/2017 c L Internuclear ophthalmoplegia Admitted 10/29/19 with some slurred speech 2/2 low CBG admitted to rule out CVA so was brought to the ED for work-up Cardiolgy consulted 2/2 Crtiical ischemia of RLE-started on IV Vanc and ceftriaxone  Hospital Course:  Critical R foot ischemia with osteomyelitis and leukocytosis of 23 -Was on broad-spectrum Vanco Flagyl ceftriaxone discontinued on  discharge -aortogram with runoff from 11 4 showing no target, status post R BKA 11/6-pain control Norco 5/325 1-2 every 4 as needed-did not require much morphine given prescription of meds on discharge  DM ty ii-a1c this admit 8.9-, home dose 48 units Low blood sugar of 66 up to 92 cut back dose to 28 units and monitor trends Will discharge home on lower dose 28 units and 3 times daily insulin as per home regimen  htn continue coreg 25 bid, amlodipine 10 daily as well as as as needed Lopressor heart rate above 130  Mild hypokalemia-resolved not on replacement  hyponatremia-much improved-saline as below-needs labs in next week or so  Anemia 2/2 CKD-given 1 U PRBC 11/3-iron level low at 14 and saturations borderline--Feraheme to be given 11/5 Hemoglobin ranging between the 7 and 8 range  ckd iiib with mild met acidosis-Diovan DC-BUN/creatinine seems to range between 1.3 and 1.5, saline lock today, monitor trends  Probable multi-infarct dementia and some behavioural issues-at times confused not aware of plan-re-orient  Procedures:  As above   Consultations:  Vascular surgery  Discharge Exam: Vitals:   11/03/19 1913 11/04/19 0345  BP: 135/64 135/65  Pulse: 68 66  Resp: 18 16  Temp: 98.3 F (36.8 C) 98.2 F (36.8 C)  SpO2: 100%     General: EOMI NCAT no focal deficit coherent making sense Cardiovascular: S1-S2 no murmur rub or gallop Respiratory: Clinically clear no added sound no rales no rhonchi Abdomen soft no rebound BKA stump seems intact   Discharge Instructions   Discharge Instructions    Diet - low sodium heart healthy  Complete by: As directed    Discharge instructions   Complete by: As directed    Notice that some of your blood pressure medications have been discontinued You will get a prescription for pain meds that can be filled at your [facility] Notice that your doses for insulin have changed to some degree you will require less long-acting  insulin You should follow-up with vascular surgeon in about 3 weeks to ensure that you have follow-up for your leg and removal of staples keep the area dry until then Need labs in about 1 to 2 weeks Blood pressure medications etc. can be adjusted in the outpatient setting   Increase activity slowly   Complete by: As directed      Allergies as of 11/04/2019      Reactions   Invokana [canagliflozin] Itching, Swelling, Other (See Comments)   Vaginal itching, swelling and irritation   Jardiance [empagliflozin] Itching, Swelling, Other (See Comments)   Vaginal itching and swelling      Medication List    STOP taking these medications   Insulin Glargine 100 UNIT/ML Solostar Pen Commonly known as: LANTUS Replaced by: insulin glargine 100 UNIT/ML injection   valsartan-hydrochlorothiazide 320-25 MG tablet Commonly known as: DIOVAN-HCT     TAKE these medications   amLODipine 10 MG tablet Commonly known as: NORVASC TAKE 1 TABLET (10 MG TOTAL) BY MOUTH DAILY. FOR HIGH BLOOD PRESSURE What changed: additional instructions   aspirin 81 MG chewable tablet Chew 1 tablet (81 mg total) by mouth daily. Start taking on: November 05, 2019   atorvastatin 20 MG tablet Commonly known as: LIPITOR TAKE 1 TABLET BY MOUTH EVERY DAY   B-D ULTRAFINE III SHORT PEN 31G X 8 MM Misc Generic drug: Insulin Pen Needle Use to check blood sugar every day. Dx: 11.29; 11.65   carvedilol 25 MG tablet Commonly known as: COREG TAKE 1 TABLET BY MOUTH TWICE A DAY WITH A MEAL What changed: See the new instructions.   clopidogrel 75 MG tablet Commonly known as: PLAVIX TAKE 1 TABLET BY MOUTH EVERY DAY   doxycycline 100 MG tablet Commonly known as: VIBRA-TABS Take 100 mg by mouth 2 (two) times daily.   gabapentin 100 MG capsule Commonly known as: NEURONTIN Take 100 mg by mouth 3 (three) times daily.   HYDROcodone-acetaminophen 5-325 MG tablet Commonly known as: NORCO/VICODIN Take 1-2 tablets by mouth  every 4 (four) hours as needed for moderate pain. What changed:   how much to take  when to take this  reasons to take this   insulin glargine 100 UNIT/ML injection Commonly known as: LANTUS Inject 0.28 mLs (28 Units total) into the skin daily. Start taking on: November 05, 2019 Replaces: Insulin Glargine 100 UNIT/ML Solostar Pen   insulin lispro 100 UNIT/ML KwikPen Commonly known as: HumaLOG KwikPen Inject 0.03 mLs (3 Units total) into the skin 3 (three) times daily. What changed: when to take this   OneTouch Verio test strip Generic drug: glucose blood USE TO TEST BLOOD SUGAR THREE TIMES DAILY. DX: E11.9 What changed: See the new instructions.   potassium chloride SA 20 MEQ tablet Commonly known as: KLOR-CON Take 1 tablet (20 mEq total) by mouth 2 (two) times daily for 3 days.      Allergies  Allergen Reactions  . Invokana [Canagliflozin] Itching, Swelling and Other (See Comments)    Vaginal itching, swelling and irritation  . Jardiance [Empagliflozin] Itching, Swelling and Other (See Comments)    Vaginal itching and swelling  The results of significant diagnostics from this hospitalization (including imaging, microbiology, ancillary and laboratory) are listed below for reference.    Significant Diagnostic Studies: Ct Head Wo Contrast  Result Date: 10/29/2019 CLINICAL DATA:  Altered mental status EXAM: CT HEAD WITHOUT CONTRAST TECHNIQUE: Contiguous axial images were obtained from the base of the skull through the vertex without intravenous contrast. COMPARISON:  02/17/2017 FINDINGS: Brain: No evidence of acute infarction, hemorrhage, hydrocephalus, extra-axial collection or mass lesion/mass effect. Scattered low-density changes within the periventricular and subcortical white matter compatible with chronic microvascular ischemic change. Mild diffuse cerebral volume loss. Vascular: Mild atherosclerotic calcifications involving the large vessels of the skull base. No  unexpected hyperdense vessel. Skull: Normal. Negative for fracture or focal lesion. Sinuses/Orbits: Chronic partial opacification involving a few right ethmoid air cells. Remaining paranasal sinuses and mastoid air cells are clear. Orbital structures within normal limits. Other: None. IMPRESSION: 1.  No acute intracranial findings. 2.  Chronic microvascular ischemic change and cerebral volume loss. Electronically Signed   By: Davina Poke M.D.   On: 10/29/2019 15:03   Dg Foot Complete Left  Result Date: 10/17/2019 Please see detailed radiograph report in office note.  Dg Foot Complete Right  Result Date: 10/29/2019 CLINICAL DATA:  Pt c/o right foot infection and diabetes wounds for an unknown period of time. Hx of DM. Patient is scheduled for surgical removal of infection on the right foot. EXAM: RIGHT FOOT COMPLETE - 3+ VIEW COMPARISON:  10/04/2019 FINDINGS: Soft tissue swelling with soft tissue air is noted along the dorsal lateral aspect of the foot, from just distal to the ankle joint to the lateral metatarsophalangeal joints. There is a subtle area apparent loss of the white cortical line along the lateral plantar margin of the cuboid which could reflect osteomyelitis. There is evidence of resorption along the medial base of the proximal phalanx of the fifth toe which could reflect osteomyelitis. No other evidence of osteomyelitis. No fractures. Joints are normally aligned. Stable first metatarsophalangeal joint osteoarthritis. Small plantar and dorsal calcaneal spurs. There are dense arterial vascular calcifications from the ankle to the foot. IMPRESSION: 1. Right foot infection with soft tissue swelling and soft tissue air along the dorsal lateral aspect of the foot as described. 2. Possible small focus of osteomyelitis along the plantar lateral aspect of the cuboid with another possible area of osteomyelitis along medial base of the proximal phalanx of the fifth toe. No other evidence of  osteomyelitis. Electronically Signed   By: Lajean Manes M.D.   On: 10/29/2019 16:56   Vas [place] Burnard Bunting With/wo Tbi  Result Date: 10/17/2019 LOWER EXTREMITY DOPPLER STUDY Indications: Ulceration, and peripheral artery disease. Patient referred from              [place] for a wound on the top of the foot, between the fourth              and fifth toes on the right. The area is very tender as is the              entire right foot. She thinks the wound began about 6 weeks ago.              Also recently found to have a fracture in the left foot but does              not complain of pain on that side. Also describes claudication  symptoms in the right ankle when she walking from her apartment to              the mailbox in her building, which forces her to stop and rest. High Risk Factors: Hypertension, hyperlipidemia, Diabetes, past history of                    smoking, prior CVA.  Comparison Study: In 11/18, a multilevel arterial Doppler study showed a right                   ABI of 0.83 (overestimated) and the left ABI was                   non-compressible; TBI's were 0.15 on the right and 0.09 on the                   left. Performing Technologist: Mariane Masters RVT  Examination Guidelines: A complete evaluation includes at minimum, Doppler waveform signals and systolic blood pressure reading at the level of bilateral brachial, anterior tibial, and posterior tibial arteries, when vessel segments are accessible. Bilateral testing is considered an integral part of a complete examination. Photoelectric Plethysmograph (PPG) waveforms and toe systolic pressure readings are included as required and additional duplex testing as needed. Limited examinations for reoccurring indications may be performed as noted.  ABI Findings: +---------+------------------+-----+-------------------+--------+ Right    Rt Pressure (mmHg)IndexWaveform           Comment   +---------+------------------+-----+-------------------+--------+ Brachial 171                                                +---------+------------------+-----+-------------------+--------+ ATA      254               1.49 dampened monophasic         +---------+------------------+-----+-------------------+--------+ PTA      254               1.49 dampened monophasic         +---------+------------------+-----+-------------------+--------+ PERO     254               1.49 dampened monophasic         +---------+------------------+-----+-------------------+--------+ Great Toe0                 0.00 Absent                      +---------+------------------+-----+-------------------+--------+ +---------+------------------+-----+-------------------+-------+ Left     Lt Pressure (mmHg)IndexWaveform           Comment +---------+------------------+-----+-------------------+-------+ Brachial 166                                               +---------+------------------+-----+-------------------+-------+ ATA      254               1.49 dampened monophasic        +---------+------------------+-----+-------------------+-------+ PTA      254               1.49 absent                     +---------+------------------+-----+-------------------+-------+ PERO  254               1.49 dampened monophasic        +---------+------------------+-----+-------------------+-------+ Great Toe0                 0.00 Absent                     +---------+------------------+-----+-------------------+-------+ +-------+-----------+-----------+------------+------------+ ABI/TBIToday's ABIToday's TBIPrevious ABIPrevious TBI +-------+-----------+-----------+------------+------------+ Right  [place]         0          0.83        0.15         +-------+-----------+-----------+------------+------------+ Left   [place]         0          [place]          0.09          +-------+-----------+-----------+------------+------------+ TOES Findings: +----------+---------------+--------+-------+ Right ToesPressure (mmHg)WaveformComment +----------+---------------+--------+-------+ 1st Digit                Absent          +----------+---------------+--------+-------+ 2nd Digit                Absent          +----------+---------------+--------+-------+ 3rd Digit                Absent          +----------+---------------+--------+-------+ 4th Digit                Absent          +----------+---------------+--------+-------+ 5th Digit                Absent          +----------+---------------+--------+-------+  +---------+---------------+--------+-------+ Left ToesPressure (mmHg)WaveformComment +---------+---------------+--------+-------+ 1st Digit               Absent          +---------+---------------+--------+-------+ 2nd Digit               Absent          +---------+---------------+--------+-------+ 3rd Digit               Absent          +---------+---------------+--------+-------+ 4th Digit               Absent          +---------+---------------+--------+-------+ 5th Digit               Absent          +---------+---------------+--------+-------+   Arterial wall calcification precludes accurate ankle pressures and ABIs. Bilateral ABIs and TBIs appear decreased compared to prior study on 10/28/17.  Summary: Right: Resting right ankle-brachial index indicates noncompressible right lower extremity arteries. The right toe-brachial index is abnormal. Waveform analysis is consistent with severe peripheral arterial disease. Left: Resting left ankle-brachial index indicates noncompressible left lower extremity arteries. The left toe-brachial index is abnormal. Waveform analysis is consistent with severe perihperal arterial disease.  *See table(s) above for measurements and observations. See arterial duplex report.  Vascular consult  recommended. Patient taken to check out and scheduled with Dr. Fletcher Anon on November 06, 2019. Electronically signed by Larae Grooms MD on 10/17/2019 at 3:14:38 PM.    Final    Vas [place] Lower Extremity Saphenous Vein Mapping  Result Date: 10/31/2019 LOWER EXTREMITY VEIN MAPPING Indications: pre op bypass  Performing Technologist: June Leap RDMS, RVT  Examination Guidelines: A  complete evaluation includes B-mode imaging, spectral Doppler, color Doppler, and power Doppler as needed of all accessible portions of each vessel. Bilateral testing is considered an integral part of a complete examination. Limited examinations for reoccurring indications may be performed as noted. +--------------+-------------+--------------------+-------------+--------------+  RT Diameter   RT Findings         GSV          LT Diameter  LT Findings        (cm)                                          (cm)                    +--------------+-------------+--------------------+-------------+--------------+      0.30                     Saphenofemoral       0.35                                                     Junction                                  +--------------+-------------+--------------------+-------------+--------------+      0.25       branching     Proximal thigh       0.18                    +--------------+-------------+--------------------+-------------+--------------+                    not          Mid thigh          0.17                                   visualized                                                  +--------------+-------------+--------------------+-------------+--------------+                    not         Distal thigh                 not visualized                visualized                                                  +--------------+-------------+--------------------+-------------+--------------+                    not             Knee             0.12  visualized                                                  +--------------+-------------+--------------------+-------------+--------------+      0.13                       Prox calf          0.13       branching    +--------------+-------------+--------------------+-------------+--------------+      0.12                        Mid calf          0.12                    +--------------+-------------+--------------------+-------------+--------------+ Diagnosing physician: Curt Jews MD Electronically signed by Curt Jews MD on 10/31/2019 at 6:22:37 PM.    Final    Vas [place] Lower Extremity Arterial Duplex  Result Date: 10/17/2019 LOWER EXTREMITY ARTERIAL DUPLEX STUDY Indications: Ulceration, and peripheral artery disease. Patient referred from              [place] for a wound on the top of the foot, between the fourth              and fifth toes on the right. The area is very tender as is the              entire right foot. She thinks the wound began about 6 weeks ago.              Also recently found to have a fracture in the left foot but does              not complain of pain on that side. Also describes claudication              symptoms in the right ankle when she walking from her apartment to              the mailbox in her building, which forces her to stop and rest. High Risk Factors: Hypertension, hyperlipidemia, Diabetes, past history of                    smoking, prior CVA.  Current ABI: Today's ABI's are non-compressible bilaterally. Limitations: Overlying bowel gas in the aorta-iliac portion of exam and calcific              plaque throughout Comparison Study: In 04/2013, an arterial duplex was performed at [facility]                   Cardiovascular which showed no significant stenosis at the                   time. Performing Technologist: Mariane Masters RVT  Examination Guidelines: A complete evaluation includes B-mode imaging,  spectral Doppler, color Doppler, and power Doppler as needed of all accessible portions of each vessel. Bilateral testing is considered an integral part of a complete examination. Limited examinations for reoccurring indications may be performed as noted.  +-----------+--------+-----+--------+----------+------------------------------+ RIGHT      PSV cm/sRatioStenosisWaveform  Comments                       +-----------+--------+-----+--------+----------+------------------------------+ CIA Prox  43                   biphasic                                 +-----------+--------+-----+--------+----------+------------------------------+ CIA Mid    79                   biphasic                                 +-----------+--------+-----+--------+----------+------------------------------+ CIA Distal 82                   triphasic                                +-----------+--------+-----+--------+----------+------------------------------+ EIA Prox   86                   biphasic                                 +-----------+--------+-----+--------+----------+------------------------------+ EIA Mid    105                  biphasic                                 +-----------+--------+-----+--------+----------+------------------------------+ EIA Distal 96                   biphasic                                 +-----------+--------+-----+--------+----------+------------------------------+ CFA Prox   67                   biphasic                                 +-----------+--------+-----+--------+----------+------------------------------+ DFA        81                   biphasic                                 +-----------+--------+-----+--------+----------+------------------------------+ SFA Prox   11                   resistant pre-occlusive thump signal     +-----------+--------+-----+--------+----------+------------------------------+ SFA Mid     0            occluded                                         +-----------+--------+-----+--------+----------+------------------------------+ SFA Distal 72                   monophasicreconstituted vs. collateral  flow                           +-----------+--------+-----+--------+----------+------------------------------+ POP Prox   13                   monophasic                               +-----------+--------+-----+--------+----------+------------------------------+ POP Distal 21                   monophasic                               +-----------+--------+-----+--------+----------+------------------------------+ TP Trunk   21                   monophasic                               +-----------+--------+-----+--------+----------+------------------------------+ ATA Distal 27                   monophasic                               +-----------+--------+-----+--------+----------+------------------------------+ PTA Distal 0            occluded                                         +-----------+--------+-----+--------+----------+------------------------------+ PERO Distal0            occluded                                         +-----------+--------+-----+--------+----------+------------------------------+  +-----------+--------+-----+--------+----------+--------+ LEFT       PSV cm/sRatioStenosisWaveform  Comments +-----------+--------+-----+--------+----------+--------+ EIA Prox   61                   monophasic         +-----------+--------+-----+--------+----------+--------+ EIA Mid    72                   monophasic         +-----------+--------+-----+--------+----------+--------+ EIA Distal 58                   monophasic         +-----------+--------+-----+--------+----------+--------+ CFA Prox   44                   monophasic          +-----------+--------+-----+--------+----------+--------+ DFA        48                   monophasic         +-----------+--------+-----+--------+----------+--------+ SFA Prox   30                   resistant          +-----------+--------+-----+--------+----------+--------+ SFA Mid    0            occluded                   +-----------+--------+-----+--------+----------+--------+  SFA Distal 0            occluded                   +-----------+--------+-----+--------+----------+--------+ POP Prox   0            occluded                   +-----------+--------+-----+--------+----------+--------+ POP Distal 19                   monophasic         +-----------+--------+-----+--------+----------+--------+ TP Trunk   16                   monophasic         +-----------+--------+-----+--------+----------+--------+ ATA Distal 30                   monophasic         +-----------+--------+-----+--------+----------+--------+ PTA Prox   0            occluded                   +-----------+--------+-----+--------+----------+--------+ PERO Distal17                   monophasic         +-----------+--------+-----+--------+----------+--------+  Aorta: +--------+-------+----------+----------+--------+--------+-----+         AP (cm)Trans (cm)PSV (cm/s)WaveformThrombusShape +--------+-------+----------+----------+--------+--------+-----+ Proximal       2.50      74        biphasic              +--------+-------+----------+----------+--------+--------+-----+ Mid     1.90   2.20      29        biphasic              +--------+-------+----------+----------+--------+--------+-----+ Distal  1.40   1.40      40        biphasic              +--------+-------+----------+----------+--------+--------+-----+ The IVC appears patent.  Summary: Calcific atherosclerosis throughout the aorta and bilateral lower extremity arteries. Right: Total  occlusion noted in the superficial femoral artery. Total occlusion noted in the posterior tibial artery. Total occlusion noted in the peroneal artery. Left: Total occlusion noted in the superficial femoral artery. Total occlusion noted in the superficial femoral artery and/or popliteal artery. Total occlusion noted in the posterior tibial artery. 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  See table(s) above for measurements and observations. Vascular consult recommended. Patient scheduled with Dr. Fletcher Anon on 11/06/19. Electronically signed by Larae Grooms MD on 10/17/2019 at 3:16:13 PM.    Final     Microbiology: Recent Results (from the past 240 hour(s))  SARS CORONAVIRUS 2 (TAT 6-24 HRS) Nasopharyngeal Nasopharyngeal Swab     Status: None   Collection Time: 10/29/19  3:58 PM   Specimen: Nasopharyngeal Swab  Result Value Ref Range Status   SARS Coronavirus 2 NEGATIVE NEGATIVE Final    Comment: (NOTE) SARS-CoV-2 target nucleic acids are NOT DETECTED. The SARS-CoV-2 RNA is generally detectable in upper and lower respiratory specimens during the acute phase of infection. Negative results do not preclude SARS-CoV-2 infection, do not rule out co-infections with other pathogens, and should not be used as the sole basis for treatment or other patient management decisions. Negative  results must be combined with clinical observations, patient history, and epidemiological information. The expected result is Negative. Fact Sheet for Patients: SugarRoll.be Fact Sheet for Healthcare Providers: https://www.woods-mathews.com/ This test is not yet approved or cleared by the [place] FDA and  has been authorized for detection and/or diagnosis of SARS-CoV-2 by FDA under an Emergency Use Authorization (EUA). This EUA will remain   in effect (meaning this test can be used) for the duration of the COVID-19 declaration under Section 56 4(b)(1) of the Act, 21 U.S.C. section 360bbb-3(b)(1), unless the authorization is terminated or revoked sooner. Performed at [facility], [place] [address], [place], [place] [address]   Culture, blood (routine x 2)     Status: None   Collection Time: 10/29/19  5:17 PM   Specimen: BLOOD RIGHT HAND  Result Value Ref Range Status   Specimen Description BLOOD RIGHT HAND  Final   Special Requests   Final    BOTTLES DRAWN AEROBIC AND ANAEROBIC Blood Culture results may not be optimal due to an inadequate volume of blood received in culture bottles   Culture   Final    NO GROWTH 5 DAYS Performed at [facility], [place] [address] [place], [place] [address]    Report Status 11/03/2019 FINAL  Final  Culture, blood (routine x 2)     Status: None   Collection Time: 10/29/19  6:04 PM   Specimen: BLOOD  Result Value Ref Range Status   Specimen Description BLOOD LEFT ANTECUBITAL  Final   Special Requests   Final    BOTTLES DRAWN AEROBIC AND ANAEROBIC Blood Culture results may not be optimal due to an inadequate volume of blood received in culture bottles   Culture   Final    NO GROWTH 5 DAYS Performed at [facility], [place] [address], [place], [place] [address]    Report Status 11/03/2019 FINAL  Final  Surgical PCR screen     Status: None   Collection Time: 10/30/19  6:42 PM   Specimen: Nasal Mucosa; Nasal Swab  Result Value Ref Range Status   MRSA, PCR NEGATIVE NEGATIVE Final   Staphylococcus aureus NEGATIVE NEGATIVE Final    Comment: (NOTE) The Xpert SA Assay (FDA approved for NASAL specimens in patients 67 years of age and older), is one component of a comprehensive surveillance program. It is not intended to diagnose infection nor to guide or monitor treatment. Performed at [facility], [place] [address] [place], [place] [address]      Labs: Basic  Metabolic Panel: Recent Labs  Lab 10/29/19 2336  10/31/19 0404 11/01/19 0403 11/02/19 0521 11/03/19 0254 11/04/19 0459  NA  --    < > 132* 132* 134* 136 133*  K  --    < > 3.3* 3.9 3.6 3.9 3.9  CL  --    < > 102 103 103 107 103  CO2  --    < > 19* 19* 21* 21* 20*  GLUCOSE  --    < > 124* 219* 147* 107* 138*  BUN  --    < > 21 24* 29* 27* 23  CREATININE  --    < > 1.29* 1.37* 1.53* 1.45* 1.20*  CALCIUM  --    < > 10.2 9.9 10.5* 10.1 9.9  MG 1.6*  --  1.8 1.7  --   --   --   PHOS 2.1*  --   --  1.8*  --   --   --    < > =  values in this interval not displayed.   Liver Function Tests: Recent Labs  Lab 10/29/19 1309 10/31/19 0404 11/01/19 0403  AST 26 25  --   ALT 19 17  --   ALKPHOS 68 82  --   BILITOT 1.1 0.7  --   PROT 6.3* 5.9*  --   ALBUMIN 1.9* 1.7* 1.5*   No results for input(s): LIPASE, AMYLASE in the last 168 hours. No results for input(s): AMMONIA in the last 168 hours. CBC: Recent Labs  Lab 10/30/19 0552 10/31/19 0404 11/01/19 0403 11/02/19 0521 11/03/19 0254  WBC 25.3* 26.1* 23.1* 27.4* 23.0*  NEUTROABS  --  21.2* 18.7* 24.9* 16.8*  HGB 6.6* 8.2* 7.3* 7.7* 7.4*  HCT 19.7* 23.9* 21.7* 22.9* 22.1*  MCV 86.4 85.4 85.1 86.7 88.0  PLT 511* 457* 407* 447* 400   Cardiac Enzymes: No results for input(s): CKTOTAL, CKMB, CKMBINDEX, TROPONINI in the last 168 hours. BNP: BNP (last 3 results) No results for input(s): BNP in the last 8760 hours.  ProBNP (last 3 results) No results for input(s): PROBNP in the last 8760 hours.  CBG: Recent Labs  Lab 11/03/19 0751 11/03/19 1150 11/03/19 1643 11/03/19 2148 11/04/19 0653  GLUCAP 92 183* 147* 104* 126*       Signed:  Nita Sells MD   [facility] 11/04/2019, 9:15 AM

## 2019-11-04 NOTE — Progress Notes (Signed)
DISCHARGE NOTE HOME Monique Cabrera to be discharged Home per MD order. Discussed prescriptions and follow up appointments with the patient. Prescriptions given to patient; medication list explained in detail. Patient verbalized understanding.  Skin clean, dry and intact without evidence of skin break down, no evidence of skin tears noted. IV catheter discontinued intact. Site without signs and symptoms of complications. Dressing and pressure applied. Pt denies pain at the site currently. No complaints noted.  Patient free of lines, drains, and wounds.   An After Visit Summary (AVS) was printed and given to the patient. Patient escorted via wheelchair, and discharged home via private auto.  Arlyss Repress, RN

## 2019-11-04 NOTE — Progress Notes (Signed)
[facility] called to restart PIV. MD in patient's room,wants to hold off with PIV at this time.

## 2019-11-05 ENCOUNTER — Other Ambulatory Visit: Payer: Self-pay

## 2019-11-05 ENCOUNTER — Telehealth: Payer: Self-pay | Admitting: *Deleted

## 2019-11-05 DIAGNOSIS — N182 Chronic kidney disease, stage 2 (mild): Secondary | ICD-10-CM | POA: Diagnosis not present

## 2019-11-05 DIAGNOSIS — E113593 Type 2 diabetes mellitus with proliferative diabetic retinopathy without macular edema, bilateral: Secondary | ICD-10-CM | POA: Diagnosis not present

## 2019-11-05 DIAGNOSIS — L97512 Non-pressure chronic ulcer of other part of right foot with fat layer exposed: Secondary | ICD-10-CM | POA: Diagnosis not present

## 2019-11-05 DIAGNOSIS — E1151 Type 2 diabetes mellitus with diabetic peripheral angiopathy without gangrene: Secondary | ICD-10-CM | POA: Diagnosis not present

## 2019-11-05 DIAGNOSIS — D631 Anemia in chronic kidney disease: Secondary | ICD-10-CM | POA: Diagnosis not present

## 2019-11-05 DIAGNOSIS — E11621 Type 2 diabetes mellitus with foot ulcer: Secondary | ICD-10-CM | POA: Diagnosis not present

## 2019-11-05 DIAGNOSIS — I129 Hypertensive chronic kidney disease with stage 1 through stage 4 chronic kidney disease, or unspecified chronic kidney disease: Secondary | ICD-10-CM | POA: Diagnosis not present

## 2019-11-05 DIAGNOSIS — I70219 Atherosclerosis of native arteries of extremities with intermittent claudication, unspecified extremity: Secondary | ICD-10-CM | POA: Diagnosis not present

## 2019-11-05 DIAGNOSIS — E785 Hyperlipidemia, unspecified: Secondary | ICD-10-CM | POA: Diagnosis not present

## 2019-11-05 DIAGNOSIS — E1122 Type 2 diabetes mellitus with diabetic chronic kidney disease: Secondary | ICD-10-CM | POA: Diagnosis not present

## 2019-11-05 DIAGNOSIS — H8110 Benign paroxysmal vertigo, unspecified ear: Secondary | ICD-10-CM | POA: Diagnosis not present

## 2019-11-05 DIAGNOSIS — Z4800 Encounter for change or removal of nonsurgical wound dressing: Secondary | ICD-10-CM | POA: Diagnosis not present

## 2019-11-05 LAB — SURGICAL PATHOLOGY

## 2019-11-05 NOTE — Telephone Encounter (Signed)
Transition Care Management Follow-up Telephone Call  Date of discharge and from where: 11/04/19 [place]  How have you been since you were released from the hospital? good  Any questions or concerns? No   Items Reviewed:  Did the pt receive and understand the discharge instructions provided? Yes   Medications obtained and verified? Yes   Any new allergies since your discharge? No   Dietary orders reviewed? Yes  Do you have support at home? Yes  Family  Other (ie: DME, Home Health, etc) Home health  Functional Questionnaire: (I = Independent and D = Dependent) ADL's: I with assistance  Bathing/Dressing- I with assistance   Meal Prep- I with assistance  Eating- I  Maintaining continence- I  Transferring/Ambulation- I with assistance  Managing Meds- I   Follow up appointments reviewed:    PCP Hospital f/u appt confirmed? Yes  Scheduled to see [name] on 11/07/19.  [facility] f/u appt confirmed? Yes    Are transportation arrangements needed? No   If their condition worsens, is the pt aware to call  their PCP or go to the ED? Yes  Was the patient provided with contact information for the PCP's office or ED? Yes  Was the pt encouraged to call back with questions or concerns? Yes

## 2019-11-05 NOTE — Patient Outreach (Signed)
[facility]) Care Management  11/05/2019  Monique Cabrera 03-01-1935 [id number]   Contacted patient' daughter, Monique Cabrera, today to inform her that patient has been approved for pcs and should have received a Determination Letter from [employer].  Daughter was unaware that she was assessed/approved for these services.  She was grateful for this information and stated that these services will be a great help to the patient and family.  Daughter stated that she will check for letter.  Encouraged her to contact [place] if it has not been received in the next couple of days as it was mailed on 10/25/19.  Provided her with contact information.  Also informed her that service agency will need to be selected.  Will follow up again before the end of the week.  Ronn Melena, BSW Social Worker [phone]

## 2019-11-06 ENCOUNTER — Institutional Professional Consult (permissible substitution): Payer: Medicare Other | Admitting: Cardiovascular Disease

## 2019-11-06 ENCOUNTER — Ambulatory Visit: Payer: Self-pay

## 2019-11-06 DIAGNOSIS — R4781 Slurred speech: Secondary | ICD-10-CM | POA: Diagnosis not present

## 2019-11-06 DIAGNOSIS — E162 Hypoglycemia, unspecified: Secondary | ICD-10-CM | POA: Diagnosis not present

## 2019-11-06 DIAGNOSIS — E1165 Type 2 diabetes mellitus with hyperglycemia: Secondary | ICD-10-CM | POA: Diagnosis not present

## 2019-11-06 DIAGNOSIS — E161 Other hypoglycemia: Secondary | ICD-10-CM | POA: Diagnosis not present

## 2019-11-06 DIAGNOSIS — I10 Essential (primary) hypertension: Secondary | ICD-10-CM | POA: Diagnosis not present

## 2019-11-07 ENCOUNTER — Ambulatory Visit (INDEPENDENT_AMBULATORY_CARE_PROVIDER_SITE_OTHER): Payer: Medicare Other | Admitting: Family

## 2019-11-07 ENCOUNTER — Encounter: Payer: Self-pay | Admitting: Family

## 2019-11-07 ENCOUNTER — Other Ambulatory Visit: Payer: Self-pay

## 2019-11-07 ENCOUNTER — Other Ambulatory Visit: Payer: Self-pay | Admitting: *Deleted

## 2019-11-07 VITALS — BP 142/86 | HR 76 | Temp 97.5°F | Resp 18 | Ht 65.0 in

## 2019-11-07 DIAGNOSIS — D72829 Elevated white blood cell count, unspecified: Secondary | ICD-10-CM

## 2019-11-07 DIAGNOSIS — N182 Chronic kidney disease, stage 2 (mild): Secondary | ICD-10-CM

## 2019-11-07 DIAGNOSIS — E1165 Type 2 diabetes mellitus with hyperglycemia: Secondary | ICD-10-CM

## 2019-11-07 DIAGNOSIS — I10 Essential (primary) hypertension: Secondary | ICD-10-CM

## 2019-11-07 DIAGNOSIS — IMO0002 Reserved for concepts with insufficient information to code with codable children: Secondary | ICD-10-CM

## 2019-11-07 DIAGNOSIS — I739 Peripheral vascular disease, unspecified: Secondary | ICD-10-CM | POA: Diagnosis not present

## 2019-11-07 DIAGNOSIS — Z89511 Acquired absence of right leg below knee: Secondary | ICD-10-CM

## 2019-11-07 DIAGNOSIS — E1129 Type 2 diabetes mellitus with other diabetic kidney complication: Secondary | ICD-10-CM

## 2019-11-07 NOTE — Progress Notes (Signed)
Provider: Zinedine Ellner FNP-C  [name], DO  Patient Care Team: [name], DO as PCP - General (Geriatric Medicine) Wellington Hampshire, MD as PCP - Cardiology (Cardiology) Adrian Prows, MD as Consulting Physician (Cardiology) Rutherford Guys, MD as Consulting Physician (Ophthalmology) Justin Mend, MD as Attending Physician (Internal Medicine) Kassie Mends, RN as [place], Museum/gallery conservator as [facility] [employer]  Extended Emergency Contact Information Primary Emergency Contact: [place] Address: [address]          [place], [place] [address] Johnnette Litter of [place] Phone: [phone] Relation: Daughter Secondary Emergency Contact: Kimberlee [place] Phone: [phone] Relation: Daughter  Code Status:  DNR Goals of care: Advanced Directive information Advanced Directives 11/15/2019  Does Patient Have a Medical Advance Directive? Yes  Type of Advance Directive Out of facility DNR (pink MOST or yellow form)  Does patient want to make changes to medical advance directive? No - Patient declined  Would patient like information on creating a medical advance directive? -  Pre-existing out of facility DNR order (yellow form or pink MOST form) -     Chief Complaint  Patient presents with   Transitions Of Care    Hospitalization 10/29/2019 - 11/04/2019 gangrene of right foot, patient would to discuss talk about aspirin and  was told by surgeon to start taking aspirin,    Medication Management    patient is confused with insulin changes and all of medication changes     HPI:  Pt is a 84 y.o. female seen today for an acute visit for evaluation of Hospitalization 10/29/2019 - 11/04/2019 gangrene of right foot.she has a medical history of Hypertension,type 2 DM with CKD stage 2,PAD,hyperlipidmia,hx CVA,chronic diastolic congestive heart failure EF 60-65% 2018,Chronic hepatitis C without  hepatic coma,anema among other conditions.[facility] was consulted due to critical Right leg ischemia with osteomyelitis and leukocytosis of 23 > 19.1(11/04/2019).she was started on IV vancomycin and ceftriaxone.she underwent right BKA 11/02/2019.she had low blood sugars in the 66 -92 her lantus was reduced from 48 units SQ to 28 units and Humalog 3 times daily was continued.Also had mild hypokalemia and hyponatremia but was replaced with much improvement.she had low Hgb 6.8 > 8.2 > 7.8 was transfused with 1 PRBC.she was discharge home with Home health Therapy.also instructed to follow up with Dr.[facility] for staple removal. patient would to discuss talk about aspirin and was told by surgeon to start taking aspirin, patient is confused with insulin and medication changes.Her Diovan was discontinued.I've discussed with her that due to right leg infection she needed more insulin but now CBG are low dose required to be reduced.Home health Nurse will assist with medication.she states right BKA pain under control has not required Prescribed Norco from the hospital she prefers to take tylenol for pain.states Norco is too strong.  Past Medical History:  Diagnosis Date   Acute upper respiratory infections of unspecified site    Anemia    Anemia, unspecified    Atherosclerosis of native arteries of the extremities, unspecified    Chest pain, unspecified    Chronic kidney disease (CKD), stage II (mild)    Diabetes mellitus    Diarrhea    Disorder of bone and cartilage, unspecified    DM (diabetes mellitus) type II controlled with renal manifestation (HCC)    Herpes zoster with other nervous system complications(053.19)    Hypercalcemia    Hypertension    Hypertensive  renal disease, benign    Nonspecific reaction to tuberculin skin test without active tuberculosis(795.51)    Nonspecific tuberculin test reaction    Other and unspecified hyperlipidemia    Pain in joint, lower  leg    Peripheral arterial disease (HCC)    Postherpetic neuralgia    Proteinuria    Stroke ([place]) 01/2017   Type II or unspecified type diabetes mellitus with renal manifestations, not stated as uncontrolled(250.40)    Type II or unspecified type diabetes mellitus with renal manifestations, uncontrolled(250.42)    Unspecified disorder of kidney and ureter    Unspecified essential hypertension    Past Surgical History:  Procedure Laterality Date   ABDOMINAL AORTOGRAM W/LOWER EXTREMITY N/A 10/31/2019   Procedure: ABDOMINAL AORTOGRAM W/LOWER EXTREMITY;  Surgeon: Wellington Hampshire, MD;  Location: [facility];  Service: Cardiovascular;  Laterality: N/A;   AMPUTATION Right 11/02/2019   Procedure: AMPUTATION BELOW KNEE RIGHT;  Surgeon: Rosetta Posner, MD;  Location: [facility];  Service: Vascular;  Laterality: Right;   hysterectomy     INCISION AND DRAINAGE Left 05/27/14   sebacous cyst, ear   PRP Left    Dr. Ricki Miller   removal of cyst from hand     removal of tumor from foot     TONSILLECTOMY      Allergies  Allergen Reactions   Invokana [Canagliflozin] Itching, Swelling and Other (See Comments)    Vaginal itching, swelling and irritation   Jardiance [Empagliflozin] Itching, Swelling and Other (See Comments)    Vaginal itching and swelling    Outpatient Encounter Medications as of 11/07/2019  Medication Sig   amLODipine (NORVASC) 10 MG tablet TAKE 1 TABLET (10 MG TOTAL) BY MOUTH DAILY. FOR HIGH BLOOD PRESSURE   atorvastatin (LIPITOR) 20 MG tablet TAKE 1 TABLET BY MOUTH EVERY DAY   carvedilol (COREG) 25 MG tablet TAKE 1 TABLET BY MOUTH TWICE A DAY WITH A MEAL   clopidogrel (PLAVIX) 75 MG tablet TAKE 1 TABLET BY MOUTH EVERY DAY   HYDROcodone-acetaminophen (NORCO/VICODIN) 5-325 MG tablet Take 1-2 tablets by mouth every 4 (four) hours as needed for moderate pain.   insulin glargine (LANTUS) 100 UNIT/ML injection Inject 0.28 mLs (28 Units total) into the skin  daily.   insulin lispro (HUMALOG KWIKPEN) 100 UNIT/ML KwikPen Inject 0.03 mLs (3 Units total) into the skin 3 (three) times daily.   Insulin Pen Needle (B-D ULTRAFINE III SHORT PEN) 31G X 8 MM MISC Use to check blood sugar every day. Dx: 11.29; 11.65   ONETOUCH VERIO test strip USE TO TEST BLOOD SUGAR THREE TIMES DAILY. DX: E11.9   [DISCONTINUED] aspirin 81 MG chewable tablet Chew 1 tablet (81 mg total) by mouth daily.   [DISCONTINUED] gabapentin (NEURONTIN) 100 MG capsule Take 100 mg by mouth 3 (three) times daily.   [DISCONTINUED] doxycycline (VIBRA-TABS) 100 MG tablet Take 100 mg by mouth 2 (two) times daily.   [DISCONTINUED] potassium chloride SA (KLOR-CON) 20 MEQ tablet Take 1 tablet (20 mEq total) by mouth 2 (two) times daily for 3 days.   No facility-administered encounter medications on file as of 11/07/2019.     Review of Systems  Constitutional: Negative for appetite change, chills, fatigue and fever.  Respiratory: Negative for cough, chest tightness, shortness of breath and wheezing.   Cardiovascular: Negative for chest pain, palpitations and leg swelling.  Gastrointestinal: Negative for abdominal distention, abdominal pain, constipation, diarrhea, nausea and vomiting.  Endocrine: Negative for cold intolerance, heat intolerance, polydipsia, polyphagia and polyuria.  Genitourinary: Negative for difficulty urinating, dysuria, flank pain, frequency and urgency.  Musculoskeletal: Positive for gait problem.       Status right below knee amputation.pain under control.   Skin: Negative for color change, pallor and rash.  Neurological: Negative for dizziness, weakness, light-headedness, numbness and headaches.  Psychiatric/Behavioral: Negative for agitation, confusion and sleep disturbance. The patient is not nervous/anxious.     Immunization History  Administered Date(s) Administered   Fluad Quad(high Dose 65+) 08/23/2019   Influenza, High Dose Seasonal PF 09/30/2018    Influenza,inj,Quad PF,6+ Mos 09/24/2013, 09/19/2014, 09/13/2016   Influenza-Unspecified 11/22/2009, 10/01/2011, 09/10/2015, 09/26/2017   PPD Test 01/12/2010   Pneumococcal Conjugate-13 11/24/2015   Pneumococcal Polysaccharide-23 12/27/2005   Pertinent  Health Maintenance Due  Topic Date Due   HEMOGLOBIN A1C  04/27/2020   OPHTHALMOLOGY EXAM  06/26/2020   FOOT EXAM  11/14/2020   INFLUENZA VACCINE  Completed   DEXA SCAN  Completed   PNA vac Low Risk Adult  Completed   Fall Risk  11/15/2019 11/07/2019 10/17/2019 10/09/2019 10/03/2019  Falls in the past year? 0 1 0 0 0  Number falls in past yr: - 0 0 0 0  Comment - - - - -  Injury with Fall? - 0 - 0 0  Risk for fall due to : - - Impaired balance/gait;Medication side effect - -  Follow up - - - - -    Vitals:   11/07/19 1358  BP: (!) 142/86  Pulse: 76  Resp: 18  Temp: (!) 97.5 F (36.4 C)  TempSrc: Temporal  SpO2: 95%  Height: _0  (1.651 m)   Body mass index is 22.16 kg/m. Physical Exam Vitals signs reviewed.  Constitutional:      General: She is not in acute distress.    Appearance: She is normal weight. She is not ill-appearing.  HENT:     Head: Normocephalic.     Right Ear: Tympanic membrane, ear canal and external ear normal. There is no impacted cerumen.     Left Ear: Tympanic membrane, ear canal and external ear normal. There is no impacted cerumen.     Nose: Nose normal. No congestion or rhinorrhea.     Mouth/Throat:     Mouth: Mucous membranes are moist.     Pharynx: Oropharynx is clear. No oropharyngeal exudate or posterior oropharyngeal erythema.  Eyes:     General: No scleral icterus.       Right eye: No discharge.        Left eye: No discharge.     Extraocular Movements: Extraocular movements intact.     Conjunctiva/sclera: Conjunctivae normal.     Pupils: Pupils are equal, round, and reactive to light.  Neck:     Musculoskeletal: Normal range of motion. No neck rigidity or muscular  tenderness.     Vascular: No carotid bruit.  Cardiovascular:     Rate and Rhythm: Normal rate and regular rhythm.     Heart sounds: Normal heart sounds. No murmur. No friction rub. No gallop.   Pulmonary:     Effort: Pulmonary effort is normal. No respiratory distress.     Breath sounds: Normal breath sounds. No wheezing, rhonchi or rales.  Chest:     Chest wall: No tenderness.  Abdominal:     General: Bowel sounds are normal. There is no distension.     Palpations: Abdomen is soft. There is no mass.     Tenderness: There is no abdominal tenderness. There is no right CVA  tenderness, left CVA tenderness, guarding or rebound.  Musculoskeletal:        General: No swelling or tenderness.     Right lower leg: No edema.     Left lower leg: No edema.     Comments: Right BKA on wheelchair.  Lymphadenopathy:     Cervical: No cervical adenopathy.  Skin:    General: Skin is warm and dry.     Coloration: Skin is not pale.     Findings: No bruising, erythema or rash.     Comments: Right knee BKA staples intact no signs of infection or drainage.   Neurological:     Mental Status: She is alert and oriented to person, place, and time.     Cranial Nerves: No cranial nerve deficit.     Sensory: No sensory deficit.     Motor: No weakness.     Coordination: Coordination normal.     Gait: Gait abnormal.  Psychiatric:        Mood and Affect: Mood normal.        Behavior: Behavior normal.        Thought Content: Thought content normal.        Judgment: Judgment normal.     Labs reviewed: Recent Labs    10/29/19 2336  10/31/19 0404 11/01/19 0403  11/04/19 0459 11/07/19 1439 11/13/19 1636  NA  --    < > 132* 132*   < > 133* 134* 133*  K  --    < > 3.3* 3.9   < > 3.9 4.3 3.3*  CL  --    < > 102 103   < > 103 102 101  CO2  --    < > 19* 19*   < > 20* 25 22  GLUCOSE  --    < > 124* 219*   < > 138* 201* 119*  BUN  --    < > 21 24*   < > _0 CREATININE  --    < > 1.29* 1.37*   < >  1.20* 1.16* 1.27*  CALCIUM  --    < > 10.2 9.9   < > 9.9 9.3 9.6  MG 1.6*  --  1.8 1.7  --   --   --   --   PHOS 2.1*  --   --  1.8*  --   --   --   --    < > = values in this interval not displayed.   Recent Labs    09/11/19 1136 09/17/19 1518 10/29/19 1309 10/31/19 0404 11/01/19 0403  AST _1 --   ALT _2 --   ALKPHOS 59  --  68 82  --   BILITOT 0.6 0.4 1.1 0.7  --   PROT 6.8 6.7 6.3* 5.9*  --   ALBUMIN 3.4*  --  1.9* 1.7* 1.5*   Recent Labs    11/03/19 0254 11/04/19 1041 11/07/19 1439 11/13/19 1636  WBC 23.0* 19.1* 17.6* 11.3*  NEUTROABS 16.8*  --  13,904* 7.9*  HGB 7.4* 7.8* 7.4* 9.0*  HCT 22.1* 23.7* 22.9* 28.0*  MCV 88.0 86.8 90.9 94.3  PLT 400 443* 406* 374   Lab Results  Component Value Date   TSH 0.989 10/29/2019   Lab Results  Component Value Date   HGBA1C 8.9 (H) 10/29/2019   Lab Results  Component Value Date   CHOL 121 02/07/2019   HDL  48 (L) 02/07/2019   LDLCALC 60 02/07/2019   TRIG 53 02/07/2019   CHOLHDL 2.5 02/07/2019    Significant Diagnostic Results in last 30 days:  Ct Head Wo Contrast  Result Date: 11/13/2019 CLINICAL DATA:  Encephalopathy, unresponsive episode, concern for stroke or bleed. EXAM: CT HEAD WITHOUT CONTRAST TECHNIQUE: Contiguous axial images were obtained from the base of the skull through the vertex without intravenous contrast. COMPARISON:  Head CT 10/29/2019. FINDINGS: Brain: No evidence of acute intracranial hemorrhage. No demarcated cortical infarction. No evidence of intracranial mass. No midline shift or extra-axial fluid collection. Mild ill-defined hypoattenuation of the cerebral white matter is nonspecific, but consistent with chronic small vessel ischemic disease. Mild generalized parenchymal atrophy. Vascular: No hyperdense vessel.  Atherosclerotic calcifications. Skull: Normal. Negative for fracture or focal lesion. Sinuses/Orbits: Visualized orbits demonstrate no acute abnormality. Partial  opacification of right ethmoid air cells. Mucosal thickening within the partially imaged right maxillary sinus. No significant mastoid effusion. IMPRESSION: 1. No evidence of acute intracranial abnormality. 2. Mild generalized parenchymal atrophy and chronic small vessel ischemic disease. 3. Paranasal sinus disease as described. Correlate for acute sinusitis. Electronically Signed   By: Kellie Simmering DO   On: 11/13/2019 19:24   Ct Head Wo Contrast  Result Date: 10/29/2019 CLINICAL DATA:  Altered mental status EXAM: CT HEAD WITHOUT CONTRAST TECHNIQUE: Contiguous axial images were obtained from the base of the skull through the vertex without intravenous contrast. COMPARISON:  02/17/2017 FINDINGS: Brain: No evidence of acute infarction, hemorrhage, hydrocephalus, extra-axial collection or mass lesion/mass effect. Scattered low-density changes within the periventricular and subcortical white matter compatible with chronic microvascular ischemic change. Mild diffuse cerebral volume loss. Vascular: Mild atherosclerotic calcifications involving the large vessels of the skull base. No unexpected hyperdense vessel. Skull: Normal. Negative for fracture or focal lesion. Sinuses/Orbits: Chronic partial opacification involving a few right ethmoid air cells. Remaining paranasal sinuses and mastoid air cells are clear. Orbital structures within normal limits. Other: None. IMPRESSION: 1.  No acute intracranial findings. 2.  Chronic microvascular ischemic change and cerebral volume loss. Electronically Signed   By: Davina Poke M.D.   On: 10/29/2019 15:03   Dg Foot Complete Left  Result Date: 11/13/2019 Please see detailed radiograph report in office note.  Dg Foot Complete Right  Result Date: 10/29/2019 CLINICAL DATA:  Pt c/o right foot infection and diabetes wounds for an unknown period of time. Hx of DM. Patient is scheduled for surgical removal of infection on the right foot. EXAM: RIGHT FOOT COMPLETE - 3+ VIEW  COMPARISON:  10/04/2019 FINDINGS: Soft tissue swelling with soft tissue air is noted along the dorsal lateral aspect of the foot, from just distal to the ankle joint to the lateral metatarsophalangeal joints. There is a subtle area apparent loss of the white cortical line along the lateral plantar margin of the cuboid which could reflect osteomyelitis. There is evidence of resorption along the medial base of the proximal phalanx of the fifth toe which could reflect osteomyelitis. No other evidence of osteomyelitis. No fractures. Joints are normally aligned. Stable first metatarsophalangeal joint osteoarthritis. Small plantar and dorsal calcaneal spurs. There are dense arterial vascular calcifications from the ankle to the foot. IMPRESSION: 1. Right foot infection with soft tissue swelling and soft tissue air along the dorsal lateral aspect of the foot as described. 2. Possible small focus of osteomyelitis along the plantar lateral aspect of the cuboid with another possible area of osteomyelitis along medial base of the proximal phalanx of the fifth  toe. No other evidence of osteomyelitis. Electronically Signed   By: Lajean Manes M.D.   On: 10/29/2019 16:56   Vas [place] Lower Extremity Saphenous Vein Mapping  Result Date: 10/31/2019 LOWER EXTREMITY VEIN MAPPING Indications: pre op bypass  Performing Technologist: June Leap RDMS, RVT  Examination Guidelines: A complete evaluation includes B-mode imaging, spectral Doppler, color Doppler, and power Doppler as needed of all accessible portions of each vessel. Bilateral testing is considered an integral part of a complete examination. Limited examinations for reoccurring indications may be performed as noted. +--------------+-------------+--------------------+-------------+--------------+   RT Diameter    RT Findings          GSV           LT Diameter   LT Findings          (cm)                                             (cm)                       +--------------+-------------+--------------------+-------------+--------------+       0.30                       Saphenofemoral        0.35                                                          Junction                                     +--------------+-------------+--------------------+-------------+--------------+       0.25        branching      Proximal thigh        0.18                      +--------------+-------------+--------------------+-------------+--------------+                      not           Mid thigh           0.17                                       visualized                                                      +--------------+-------------+--------------------+-------------+--------------+                      not          Distal thigh                   not visualized  visualized                                                      +--------------+-------------+--------------------+-------------+--------------+                      not              Knee             0.12                                       visualized                                                      +--------------+-------------+--------------------+-------------+--------------+       0.13                         Prox calf           0.13        branching     +--------------+-------------+--------------------+-------------+--------------+       0.12                          Mid calf           0.12                      +--------------+-------------+--------------------+-------------+--------------+ Diagnosing physician: Curt Jews MD Electronically signed by Curt Jews MD on 10/31/2019 at 6:22:37 PM.    Final     Assessment/Plan 1. Type II diabetes mellitus with renal manifestations, uncontrolled ([place]) Lab Results  Component Value Date   HGBA1C 8.9 (H) 10/29/2019   Had hypoglycemia during hospital admission.Lantus reduced from 48 units to 28 units.continue on Lantus 28 units and Humalog 3  units SQ three times daily.also continue on ASA,BBB and Statin.on Glucose 4 GM chewable tablet as needed for low blood sugars.   2. Chronic kidney disease (CKD), stage II (mild) CR high during hospitalization.encouraged to avoid Nephrotoxins. - BMP with eGFR([facility])  3. Leukocytosis, unspecified type Afebrile.WBC 23 > 19.1(11/04/2019) due to osteomyelitis.Status post I.V ABX treatment and post Right BKA.will recheck CBC. - CBC with Differential/Platelet  4. Essential hypertension, benign Diavon discontinued during hospitalization.continue on amlodipine 10 mg tablet daily and Carvedilil 25 mg tablet daily.On ASA and Statin for cardiovascular event prophylaxis  5. PAD (peripheral artery disease) (HCC) Status post right BKA secondary to critical ischemia.continue on clopidogrel 75 mg tablet,ASA 81 mg tablet daily,Carvedilil 25 mg tablet daily and atorvastatin 20 mg tablet daily.  6. Status post below-knee amputation of right lower extremity (HCC) Afebrile.Pain under control.Staples dry,clean and intact.folow up with Dr.[facility] V[facility] Surgery for staple removal.continue with Home health Physical Therapy.  Family/ staff Communication: Reviewed plan of care with patient.  Labs/tests ordered:  - CBC with Differential/Platelet - BMP with eGFR([facility])  Nelda Bucks Pearse Shiffler, NP

## 2019-11-07 NOTE — Patient Outreach (Signed)
Outreach call to pt for post hospital follow up, Primary MD completes transition of care, pt hospitalized 11/2-11/8/20 for right foot ischemia and osteomyelitis/ gangrene, right BKA, patient's family member answered the phone and states they are on the way taking pt to the doctor and request call back another day.  PLAN Outreach pt in 3-4 business days  Jacqlyn Larsen [facility], [place] [phone]

## 2019-11-08 ENCOUNTER — Other Ambulatory Visit: Payer: Self-pay

## 2019-11-08 LAB — CBC WITH DIFFERENTIAL/PLATELET
Absolute Monocytes: 1408 cells/uL — ABNORMAL HIGH (ref 200–950)
Basophils Absolute: 70 cells/uL (ref 0–200)
Basophils Relative: 0.4 %
Eosinophils Absolute: 194 cells/uL (ref 15–500)
Eosinophils Relative: 1.1 %
HCT: 22.9 % — ABNORMAL LOW (ref 35.0–45.0)
Hemoglobin: 7.4 g/dL — ABNORMAL LOW (ref 11.7–15.5)
Lymphs Abs: 2024 cells/uL (ref 850–3900)
MCH: 29.4 pg (ref 27.0–33.0)
MCHC: 32.3 g/dL (ref 32.0–36.0)
MCV: 90.9 fL (ref 80.0–100.0)
MPV: 9.1 fL (ref 7.5–12.5)
Monocytes Relative: 8 %
Neutro Abs: 13904 cells/uL — ABNORMAL HIGH (ref 1500–7800)
Neutrophils Relative %: 79 %
Platelets: 406 10*3/uL — ABNORMAL HIGH (ref 140–400)
RBC: 2.52 10*6/uL — ABNORMAL LOW (ref 3.80–5.10)
RDW: 14.2 % (ref 11.0–15.0)
Total Lymphocyte: 11.5 %
WBC: 17.6 10*3/uL — ABNORMAL HIGH (ref 3.8–10.8)

## 2019-11-08 LAB — BASIC METABOLIC PANEL WITH GFR
BUN/Creatinine Ratio: 14 (calc) (ref 6–22)
BUN: 16 mg/dL (ref 7–25)
CO2: 25 mmol/L (ref 20–32)
Calcium: 9.3 mg/dL (ref 8.6–10.4)
Chloride: 102 mmol/L (ref 98–110)
Creat: 1.16 mg/dL — ABNORMAL HIGH (ref 0.60–0.88)
GFR, Est African American: 51 mL/min/{1.73_m2} — ABNORMAL LOW (ref >=60)
GFR, Est Non African American: 44 mL/min/{1.73_m2} — ABNORMAL LOW (ref >=60)
Glucose, Bld: 201 mg/dL — ABNORMAL HIGH (ref 65–139)
Potassium: 4.3 mmol/L (ref 3.5–5.3)
Sodium: 134 mmol/L — ABNORMAL LOW (ref 135–146)

## 2019-11-08 NOTE — Patient Outreach (Signed)
[facility]) Care Management  11/08/2019  Monique Cabrera [birth year] [id number]   Successful follow up call to patient's daughter, Jeral Fruit, regarding status of request for pcs.  Patient did receive determination letter from [facility] and has decided on agency to provide service.  Daughter could not remember name of agency but stated that patient selected it because a friend of the family works for them and said that she can provide aide services for patient.   Daughter attempted to contact [facility] but was told that patient needs to be present during call.  Per daughter, another family member is supposed to be assisting patient with contacting [facility] today.  Will follow up again within the next two weeks.  Ronn Melena, BSW Social Worker [phone]

## 2019-11-09 ENCOUNTER — Telehealth: Payer: Self-pay

## 2019-11-09 DIAGNOSIS — E1122 Type 2 diabetes mellitus with diabetic chronic kidney disease: Secondary | ICD-10-CM | POA: Diagnosis not present

## 2019-11-09 DIAGNOSIS — I129 Hypertensive chronic kidney disease with stage 1 through stage 4 chronic kidney disease, or unspecified chronic kidney disease: Secondary | ICD-10-CM | POA: Diagnosis not present

## 2019-11-09 DIAGNOSIS — H8110 Benign paroxysmal vertigo, unspecified ear: Secondary | ICD-10-CM | POA: Diagnosis not present

## 2019-11-09 DIAGNOSIS — E785 Hyperlipidemia, unspecified: Secondary | ICD-10-CM | POA: Diagnosis not present

## 2019-11-09 DIAGNOSIS — L97512 Non-pressure chronic ulcer of other part of right foot with fat layer exposed: Secondary | ICD-10-CM | POA: Diagnosis not present

## 2019-11-09 DIAGNOSIS — N182 Chronic kidney disease, stage 2 (mild): Secondary | ICD-10-CM | POA: Diagnosis not present

## 2019-11-09 DIAGNOSIS — Z4800 Encounter for change or removal of nonsurgical wound dressing: Secondary | ICD-10-CM | POA: Diagnosis not present

## 2019-11-09 DIAGNOSIS — E1151 Type 2 diabetes mellitus with diabetic peripheral angiopathy without gangrene: Secondary | ICD-10-CM | POA: Diagnosis not present

## 2019-11-09 DIAGNOSIS — D631 Anemia in chronic kidney disease: Secondary | ICD-10-CM | POA: Diagnosis not present

## 2019-11-09 DIAGNOSIS — E113593 Type 2 diabetes mellitus with proliferative diabetic retinopathy without macular edema, bilateral: Secondary | ICD-10-CM | POA: Diagnosis not present

## 2019-11-09 DIAGNOSIS — I70219 Atherosclerosis of native arteries of extremities with intermittent claudication, unspecified extremity: Secondary | ICD-10-CM | POA: Diagnosis not present

## 2019-11-09 DIAGNOSIS — E11621 Type 2 diabetes mellitus with foot ulcer: Secondary | ICD-10-CM | POA: Diagnosis not present

## 2019-11-09 NOTE — Telephone Encounter (Signed)
Fax orders for Home health Physical therapy for ROM,exercise,muscle strengthening and transfers.

## 2019-11-09 NOTE — Telephone Encounter (Signed)
Frankie a physical therapist with [facility] states she still waiting for orders to be faxed over for patient. Routing to provider.

## 2019-11-13 ENCOUNTER — Encounter: Payer: Self-pay | Admitting: Podiatry

## 2019-11-13 ENCOUNTER — Ambulatory Visit (INDEPENDENT_AMBULATORY_CARE_PROVIDER_SITE_OTHER): Payer: Medicare Other | Admitting: Podiatry

## 2019-11-13 ENCOUNTER — Encounter: Payer: Self-pay | Admitting: *Deleted

## 2019-11-13 ENCOUNTER — Other Ambulatory Visit: Payer: Self-pay

## 2019-11-13 ENCOUNTER — Ambulatory Visit (INDEPENDENT_AMBULATORY_CARE_PROVIDER_SITE_OTHER): Payer: Medicare Other

## 2019-11-13 ENCOUNTER — Other Ambulatory Visit: Payer: Self-pay | Admitting: *Deleted

## 2019-11-13 ENCOUNTER — Emergency Department (HOSPITAL_COMMUNITY)
Admission: EM | Admit: 2019-11-13 | Discharge: 2019-11-13 | Disposition: A | Discharge status: Home / Self Care | Payer: Medicare Other | Attending: Emergency Medicine | Admitting: Emergency Medicine

## 2019-11-13 ENCOUNTER — Emergency Department (HOSPITAL_COMMUNITY): Payer: Medicare Other

## 2019-11-13 DIAGNOSIS — Z79899 Other long term (current) drug therapy: Secondary | ICD-10-CM | POA: Insufficient documentation

## 2019-11-13 DIAGNOSIS — E161 Other hypoglycemia: Secondary | ICD-10-CM | POA: Diagnosis not present

## 2019-11-13 DIAGNOSIS — Z87891 Personal history of nicotine dependence: Secondary | ICD-10-CM | POA: Diagnosis not present

## 2019-11-13 DIAGNOSIS — S92312D Displaced fracture of first metatarsal bone, left foot, subsequent encounter for fracture with routine healing: Secondary | ICD-10-CM

## 2019-11-13 DIAGNOSIS — I130 Hypertensive heart and chronic kidney disease with heart failure and stage 1 through stage 4 chronic kidney disease, or unspecified chronic kidney disease: Secondary | ICD-10-CM | POA: Insufficient documentation

## 2019-11-13 DIAGNOSIS — I5032 Chronic diastolic (congestive) heart failure: Secondary | ICD-10-CM | POA: Diagnosis not present

## 2019-11-13 DIAGNOSIS — E1151 Type 2 diabetes mellitus with diabetic peripheral angiopathy without gangrene: Secondary | ICD-10-CM | POA: Diagnosis not present

## 2019-11-13 DIAGNOSIS — E162 Hypoglycemia, unspecified: Secondary | ICD-10-CM

## 2019-11-13 DIAGNOSIS — E1165 Type 2 diabetes mellitus with hyperglycemia: Secondary | ICD-10-CM | POA: Diagnosis not present

## 2019-11-13 DIAGNOSIS — Z794 Long term (current) use of insulin: Secondary | ICD-10-CM | POA: Diagnosis not present

## 2019-11-13 DIAGNOSIS — Z89511 Acquired absence of right leg below knee: Secondary | ICD-10-CM

## 2019-11-13 DIAGNOSIS — E11649 Type 2 diabetes mellitus with hypoglycemia without coma: Secondary | ICD-10-CM | POA: Diagnosis not present

## 2019-11-13 DIAGNOSIS — R4182 Altered mental status, unspecified: Secondary | ICD-10-CM | POA: Insufficient documentation

## 2019-11-13 DIAGNOSIS — N182 Chronic kidney disease, stage 2 (mild): Secondary | ICD-10-CM | POA: Diagnosis not present

## 2019-11-13 DIAGNOSIS — Z743 Need for continuous supervision: Secondary | ICD-10-CM | POA: Diagnosis not present

## 2019-11-13 DIAGNOSIS — R55 Syncope and collapse: Secondary | ICD-10-CM | POA: Diagnosis not present

## 2019-11-13 DIAGNOSIS — R4020 Unspecified coma: Secondary | ICD-10-CM | POA: Diagnosis not present

## 2019-11-13 DIAGNOSIS — I10 Essential (primary) hypertension: Secondary | ICD-10-CM | POA: Diagnosis not present

## 2019-11-13 LAB — CBC WITH DIFFERENTIAL/PLATELET
Abs Immature Granulocytes: 0.36 10*3/uL — ABNORMAL HIGH (ref 0.00–0.07)
Basophils Absolute: 0.1 10*3/uL (ref 0.0–0.1)
Basophils Relative: 1 %
Eosinophils Absolute: 0.3 10*3/uL (ref 0.0–0.5)
Eosinophils Relative: 3 %
HCT: 28 % — ABNORMAL LOW (ref 36.0–46.0)
Hemoglobin: 9 g/dL — ABNORMAL LOW (ref 12.0–15.0)
Immature Granulocytes: 3 %
Lymphocytes Relative: 15 %
Lymphs Abs: 1.6 10*3/uL (ref 0.7–4.0)
MCH: 30.3 pg (ref 26.0–34.0)
MCHC: 32.1 g/dL (ref 30.0–36.0)
MCV: 94.3 fL (ref 80.0–100.0)
Monocytes Absolute: 1.1 10*3/uL — ABNORMAL HIGH (ref 0.1–1.0)
Monocytes Relative: 9 %
Neutro Abs: 7.9 10*3/uL — ABNORMAL HIGH (ref 1.7–7.7)
Neutrophils Relative %: 69 %
Platelets: 374 10*3/uL (ref 150–400)
RBC: 2.97 MIL/uL — ABNORMAL LOW (ref 3.87–5.11)
RDW: 18.5 % — ABNORMAL HIGH (ref 11.5–15.5)
WBC: 11.3 10*3/uL — ABNORMAL HIGH (ref 4.0–10.5)
nRBC: 0 % (ref 0.0–0.2)

## 2019-11-13 LAB — URINALYSIS, ROUTINE W REFLEX MICROSCOPIC
Bilirubin Urine: NEGATIVE
Glucose, UA: NEGATIVE mg/dL
Hgb urine dipstick: NEGATIVE
Ketones, ur: NEGATIVE mg/dL
Leukocytes,Ua: NEGATIVE
Nitrite: NEGATIVE
Protein, ur: 100 mg/dL — AB
Specific Gravity, Urine: 1.01 (ref 1.005–1.030)
pH: 7 (ref 5.0–8.0)

## 2019-11-13 LAB — BASIC METABOLIC PANEL
Anion gap: 10 (ref 5–15)
BUN: 20 mg/dL (ref 8–23)
CO2: 22 mmol/L (ref 22–32)
Calcium: 9.6 mg/dL (ref 8.9–10.3)
Chloride: 101 mmol/L (ref 98–111)
Creatinine, Ser: 1.27 mg/dL — ABNORMAL HIGH (ref 0.44–1.00)
GFR calc Af Amer: 46 mL/min — ABNORMAL LOW (ref >60)
GFR calc non Af Amer: 39 mL/min — ABNORMAL LOW (ref >60)
Glucose, Bld: 119 mg/dL — ABNORMAL HIGH (ref 70–99)
Potassium: 3.3 mmol/L — ABNORMAL LOW (ref 3.5–5.1)
Sodium: 133 mmol/L — ABNORMAL LOW (ref 135–145)

## 2019-11-13 LAB — CBG MONITORING, ED
Glucose-Capillary: 116 mg/dL — ABNORMAL HIGH (ref 70–99)
Glucose-Capillary: 86 mg/dL (ref 70–99)
Glucose-Capillary: 72 mg/dL (ref 70–99)
Glucose-Capillary: 270 mg/dL — ABNORMAL HIGH (ref 70–99)

## 2019-11-13 NOTE — ED Provider Notes (Signed)
[facility] EMERGENCY DEPARTMENT Provider Note   CSN: [id number] Arrival date & time: 11/13/19  1604     History   Chief Complaint Chief Complaint  Patient presents with  . Hypoglycemia    HPI Monique Cabrera is a 84 y.o. female.  Presents emerge department chief complaint low blood sugar.  Patient reports that she remembers waking up this morning with no symptoms, blood sugar this morning was 150, took her 28 units of long-acting insulin as per her prescription.  Went to a appointment for podiatry this morning.  Then she does not remember what happened after that.  Additional history obtained from patient's daughter who reports that while they were at [facility] patient became lethargic and had difficulty arousing her.  No focal neurologic complaints or deficits were noted at that time.  Rescue squad was called, EMS reported initial blood sugar was 40 after she was given dextrose, patient became alert immediately and had no complaints.  At time of my interview patient has no complaints.     HPI  Past Medical History:  Diagnosis Date  . Acute upper respiratory infections of unspecified site   . Anemia   . Anemia, unspecified   . Atherosclerosis of native arteries of the extremities, unspecified   . Chest pain, unspecified   . Chronic kidney disease (CKD), stage II (mild)   . Diabetes mellitus   . Diarrhea   . Disorder of bone and cartilage, unspecified   . DM (diabetes mellitus) type II controlled with renal manifestation ([place])   . Herpes zoster with other nervous system complications(053.19)   . Hypercalcemia   . Hypertension   . Hypertensive renal disease, benign   . Nonspecific reaction to tuberculin skin test without active tuberculosis(795.51)   . Nonspecific tuberculin test reaction   . Other and unspecified hyperlipidemia   . Pain in joint, lower leg   . Peripheral arterial disease ([place])   . Postherpetic neuralgia   . Proteinuria   . Stroke ([place])  01/2017  . Type II or unspecified type diabetes mellitus with renal manifestations, not stated as uncontrolled(250.40)   . Type II or unspecified type diabetes mellitus with renal manifestations, uncontrolled(250.42)   . Unspecified disorder of kidney and ureter   . Unspecified essential hypertension     Patient Active Problem List   Diagnosis Date Noted  . Status post below-knee amputation of right lower extremity ([place]) 11/07/2019  . Gangrene of right foot ([place])   . Osteomyelitis ([place]) 10/29/2019  . Hyponatremia 10/29/2019  . Chronic diastolic CHF (congestive heart failure) ([place]) 10/29/2019  . Stable proliferative diabetic retinopathy of both eyes associated with type 2 diabetes mellitus ([place]) 06/05/2018  . Acute CVA (cerebrovascular accident) ([place]) 02/18/2017  . Acute ischemic stroke ([place])   . Internuclear ophthalmoplegia of left eye   . Benign paroxysmal positional vertigo   . Abnormality of gait   . Acute onset of severe vertigo 02/17/2017  . Vertigo 02/17/2017  . Atherosclerosis of native artery of extremity with intermittent claudication ([place]) 03/11/2014  . Diabetic retinopathy ([place]) 03/11/2014  . Retinal hemorrhage due to secondary diabetes ([place]) 03/11/2014  . Type II diabetes mellitus with renal manifestations, uncontrolled ([place]) 03/11/2014  . Chronic hepatitis C without hepatic coma ([place]) 03/11/2014  . Hyperlipidemia 03/11/2014  . Hypoglycemia 04/16/2013  . Disorder of bone and cartilage, unspecified   . Other and unspecified hyperlipidemia   . Essential hypertension, benign   . Atherosclerosis of native artery of extremity ([place])   .  Chronic kidney disease (CKD), stage II (mild)   . Peripheral arterial disease ([place])   . Anemia   . Postherpetic neuralgia   . Hypertension     Past Surgical History:  Procedure Laterality Date  . ABDOMINAL AORTOGRAM W/LOWER EXTREMITY N/A 10/31/2019   Procedure: ABDOMINAL AORTOGRAM W/LOWER EXTREMITY;  Surgeon: Wellington Hampshire, MD;   Location: [facility];  Service: Cardiovascular;  Laterality: N/A;  . AMPUTATION Right 11/02/2019   Procedure: AMPUTATION BELOW KNEE RIGHT;  Surgeon: Rosetta Posner, MD;  Location: [facility];  Service: Vascular;  Laterality: Right;  . hysterectomy    . INCISION AND DRAINAGE Left 05/27/14   sebacous cyst, ear  . PRP Left    Dr. Ricki Miller  . removal of cyst from hand    . removal of tumor from foot    . TONSILLECTOMY       OB History   No obstetric history on file.      Home Medications    Prior to Admission medications   Medication Sig Start Date End Date Taking? Authorizing Provider  amLODipine (NORVASC) 10 MG tablet TAKE 1 TABLET (10 MG TOTAL) BY MOUTH DAILY. FOR HIGH BLOOD PRESSURE 06/13/19   Reed, Tiffany L, DO  aspirin 81 MG chewable tablet Chew 1 tablet (81 mg total) by mouth daily. 11/05/19   Nita Sells, MD  atorvastatin (LIPITOR) 20 MG tablet TAKE 1 TABLET BY MOUTH EVERY DAY 06/11/19   Despina Hick, MD  carvedilol (COREG) 25 MG tablet TAKE 1 TABLET BY MOUTH TWICE A DAY WITH A MEAL 12/26/18   Reed, Tiffany L, DO  clopidogrel (PLAVIX) 75 MG tablet TAKE 1 TABLET BY MOUTH EVERY DAY 10/25/19   Reed, Tiffany L, DO  gabapentin (NEURONTIN) 100 MG capsule Take 100 mg by mouth 3 (three) times daily.    [provider]  HYDROcodone-acetaminophen (NORCO/VICODIN) 5-325 MG tablet Take 1-2 tablets by mouth every 4 (four) hours as needed for moderate pain. 11/04/19   Nita Sells, MD  insulin glargine (LANTUS) 100 UNIT/ML injection Inject 0.28 mLs (28 Units total) into the skin daily. 11/05/19   Nita Sells, MD  insulin lispro (HUMALOG KWIKPEN) 100 UNIT/ML KwikPen Inject 0.03 mLs (3 Units total) into the skin 3 (three) times daily. 10/18/19   Reed, Tiffany L, DO  Insulin Pen Needle (B-D ULTRAFINE III SHORT PEN) 31G X 8 MM MISC Use to check blood sugar every day. Dx: 11.29; 11.65 07/02/19   Reed, Tiffany L, DO  ONETOUCH VERIO test strip USE TO TEST BLOOD SUGAR  THREE TIMES DAILY. DX: E11.9 10/08/19   [name], DO    Family History Family History  Problem Relation Age of Onset  . Diabetes Mother   . Diabetes Father   . Diabetes Sister   . Diabetes Sister     Social History Social History   Tobacco Use  . Smoking status: Former Smoker    Types: Cigarettes  . Smokeless tobacco: Never Used  . Tobacco comment: Quit about age 62   Substance Use Topics  . Alcohol use: No    Alcohol/week: 0.0 standard drinks  . Drug use: No     Allergies   Invokana [canagliflozin] and Jardiance [empagliflozin]   Review of Systems Review of Systems  Constitutional: Negative for chills and fever.  HENT: Negative for ear pain and sore throat.   Eyes: Negative for pain and visual disturbance.  Respiratory: Negative for cough and shortness of breath.   Cardiovascular: Negative for  chest pain and palpitations.  Gastrointestinal: Negative for abdominal pain and vomiting.  Genitourinary: Negative for dysuria and hematuria.  Musculoskeletal: Negative for arthralgias and back pain.  Skin: Negative for color change and rash.  Neurological: Positive for weakness. Negative for seizures and syncope.  All other systems reviewed and are negative.    Physical Exam Updated Vital Signs BP (!) 154/66 (BP Location: Left Arm)   Pulse (!) 58   Temp 97.9 F (36.6 C) (Oral)   Resp 16   SpO2 100%   Physical Exam Vitals signs and nursing note reviewed.  Constitutional:      General: She is not in acute distress.    Appearance: She is well-developed.  HENT:     Head: Normocephalic and atraumatic.  Eyes:     Conjunctiva/sclera: Conjunctivae normal.  Neck:     Musculoskeletal: Neck supple.  Cardiovascular:     Rate and Rhythm: Normal rate and regular rhythm.     Heart sounds: No murmur.  Pulmonary:     Effort: Pulmonary effort is normal. No respiratory distress.     Breath sounds: Normal breath sounds.  Abdominal:     Palpations: Abdomen is soft.      Tenderness: There is no abdominal tenderness.  Musculoskeletal:        General: No swelling or tenderness.  Skin:    General: Skin is warm and dry.  Neurological:     General: No focal deficit present.     Mental Status: She is alert and oriented to person, place, and time. Mental status is at baseline.     Cranial Nerves: No cranial nerve deficit.     Motor: No weakness.     Coordination: Coordination normal.     Gait: Gait normal.  Psychiatric:        Mood and Affect: Mood normal.        Behavior: Behavior normal.      ED Treatments / Results  Labs (all labs ordered are listed, but only abnormal results are displayed) Labs Reviewed  CBC WITH DIFFERENTIAL/PLATELET - Abnormal; Notable for the following components:      Result Value   WBC 11.3 (*)    RBC 2.97 (*)    Hemoglobin 9.0 (*)    HCT 28.0 (*)    RDW 18.5 (*)    Neutro Abs 7.9 (*)    Monocytes Absolute 1.1 (*)    Abs Immature Granulocytes 0.36 (*)    All other components within normal limits  BASIC METABOLIC PANEL - Abnormal; Notable for the following components:   Sodium 133 (*)    Potassium 3.3 (*)    Glucose, Bld 119 (*)    Creatinine, Ser 1.27 (*)    GFR calc non Af Amer 39 (*)    GFR calc Af Amer 46 (*)    All other components within normal limits  URINALYSIS, ROUTINE W REFLEX MICROSCOPIC - Abnormal; Notable for the following components:   APPearance HAZY (*)    Protein, ur 100 (*)    Bacteria, UA RARE (*)    All other components within normal limits  CBG MONITORING, ED - Abnormal; Notable for the following components:   Glucose-Capillary 116 (*)    All other components within normal limits  CBG MONITORING, ED - Abnormal; Notable for the following components:   Glucose-Capillary 270 (*)    All other components within normal limits  CBG MONITORING, ED  CBG MONITORING, ED    EKG None  Radiology Ct Head  Wo Contrast  Result Date: 11/13/2019 CLINICAL DATA:  Encephalopathy, unresponsive  episode, concern for stroke or bleed. EXAM: CT HEAD WITHOUT CONTRAST TECHNIQUE: Contiguous axial images were obtained from the base of the skull through the vertex without intravenous contrast. COMPARISON:  Head CT 10/29/2019. FINDINGS: Brain: No evidence of acute intracranial hemorrhage. No demarcated cortical infarction. No evidence of intracranial mass. No midline shift or extra-axial fluid collection. Mild ill-defined hypoattenuation of the cerebral white matter is nonspecific, but consistent with chronic small vessel ischemic disease. Mild generalized parenchymal atrophy. Vascular: No hyperdense vessel.  Atherosclerotic calcifications. Skull: Normal. Negative for fracture or focal lesion. Sinuses/Orbits: Visualized orbits demonstrate no acute abnormality. Partial opacification of right ethmoid air cells. Mucosal thickening within the partially imaged right maxillary sinus. No significant mastoid effusion. IMPRESSION: 1. No evidence of acute intracranial abnormality. 2. Mild generalized parenchymal atrophy and chronic small vessel ischemic disease. 3. Paranasal sinus disease as described. Correlate for acute sinusitis. Electronically Signed   By: Kellie Simmering DO   On: 11/13/2019 19:24   Dg Foot Complete Left  Result Date: 11/13/2019 Please see detailed radiograph report in office note.   Procedures Procedures (including critical care time)  Medications Ordered in ED Medications - No data to display   Initial Impression / Assessment and Plan / ED Course  I have reviewed the triage vital signs and the nursing notes.  Pertinent labs & imaging results that were available during my care of the patient were reviewed by me and considered in my medical decision making (see chart for details).        84 year old lady past medical history diabetes, on insulin presents to ER after episode of hypoglycemia, unresponsiveness.  Here patient was normoglycemic, had no ongoing symptoms.  No preceding chest  pain or shortness of breath.  EKG without acute changes.  CT head negative for acute changes.  No focal deficits on exam, doubt stroke.  Suspect hypoglycemia related to diet changes and her long-acting insulin from this morning.  She was observed in the ER, provided food and drink.  She did not have any additional precipitous drops in her blood sugars and remained asymptomatic.  I discussed admission given her significant medical comorbidities, episode of hypoglycemia earlier today and somewhat labile sugar level in ER (ranged from 72 - 270).  She states she strongly desires to go home.  Family later came to bedside, she will have family with her this evening and tomorrow.  I recommended that tomorrow morning she will hold her long-acting insulin but continue with her regular mealtime insulin.  Additionally recommended calling her primary doctor to discuss ongoing management of her insulin regimen.  She has an appointment on Thursday morning with her primary doctor.  Precautions, will discharge home.  Final Clinical Impressions(s) / ED Diagnoses   Final diagnoses:  Hypoglycemia  Type 2 diabetes mellitus with hypoglycemia without coma, with long-term current use of insulin [facility])    ED Discharge Orders    None       Lucrezia Starch, MD 11/13/19 2057

## 2019-11-13 NOTE — Discharge Instructions (Addendum)
For tomorrow, recommend holding her dose of Lantus, but continuing her mealtime insulin.  Please continue to check sugars frequently throughout the day as discussed.  Tomorrow morning, please call her primary care doctor to discuss her episode of low blood sugar today and discuss ongoing insulin management.  Please keep your appointment on 10 AM with your primary doctor on Thursday.  If you have any episodes of feeling weak, passing out, chest pain, difficulty breathing, please return to ER for reassessment.

## 2019-11-13 NOTE — Patient Outreach (Signed)
Pt hospitalized 11/2-11/8/20 for right foot ischemia with osteomyelitis/ gangrene, right foot amputation, Primary MD completes transition of care, outreach call to pt (2nd attempt) for post hospital follow up, spoke with pt, HIPAA verified, pt states " I'm doing much better"  Pt reports she has 24/7 round the clock care with her adult children assisting,  Pt has home health PT, RN and reports she uses mostly wheelchair and is transferring from chair to chair and following safety precautions.  Pt states there is no wound care to do as incision is dry and intact.  CBG today 158 and most readings in low 100's, no hypoglycemia reported,  No new concerns reported by pt.  THN CM Care Plan Problem One     Most Recent Value  Care Plan Problem One  Knowledge deficit related to self management of diabetes  Role Documenting the Problem One  Care Management Coordinator  Care Plan for Problem One  Active  THN Long Term Goal   Pt will verbalize, demonstrate improved self care for diabetes management within 60 days aeb better glucose control and wound healing  THN Long Term Goal Start Date  10/17/19  Interventions for Problem One Long Term Goal  RN CM reviewed plan of care with pt including upcoming appointments, to follow up with Dr. Donnetta Hutching 12/04/19 for staple removal, verified home health RN and PT is working with pt, pt reports she has all medications and taking as presribed  THN CM Short Term Goal #1   Pt will verbalize plate method within 30 days  THN CM Short Term Goal #1 Start Date  10/17/19  Interventions for Short Term Goal #1  RN CM reinforced plate method, portion control, importance of nutritious food choices  THN CM Short Term Goal #2   Pt will verbalize signs/ symptoms hypoglycemia and actions to take within 30 days  THN CM Short Term Goal #2 Start Date  10/17/19  Interventions for Short Term Goal #2  RN CM reinforced action plan for hypoglycemia and signs/ symptoms hypoglycemia, pt denies any episodes  of hypoglycemia  THN CM Short Term Goal #3  Pt will show improvement in wound healing within 30 days  THN CM Short Term Goal #3 Start Date  10/17/19  Interventions for Short Tern Goal #3  Pt states incision is not draining and is healing well,   denies any signs/ symtptoms infection    THN CM Care Plan Problem Two     Most Recent Value  Care Plan Problem Two  High risk for falls  Role Documenting the Problem Two  Care Management [place] for Problem Two  Active  THN CM Short Term Goal #1   Pt will report no falls and adhere to safety precautions within 30 days  THN CM Short Term Goal #1 Start Date  10/17/19  Interventions for Short Term Goal #2   RN CM reinforced safety precautions, importance of working home health PT, pt states she has all needed DME        PLAN Outreach pt for telephone assessment in 2 weeks  Jacqlyn Larsen [facility], [place] [phone]

## 2019-11-13 NOTE — ED Notes (Signed)
Patient returned from CT

## 2019-11-13 NOTE — ED Notes (Signed)
Pt moved to trauma B for EKG, vitals update and urinalysis. Pt used bedside commode without assistance. VSS.

## 2019-11-13 NOTE — ED Notes (Signed)
Patient verbalizes understanding of discharge instructions. Opportunity for questioning and answers were provided. Armband removed by staff, pt discharged from ED.  

## 2019-11-13 NOTE — ED Notes (Signed)
Patient transported to CT 

## 2019-11-13 NOTE — ED Triage Notes (Signed)
BIB EMS. Family called for stroke like symptoms. When EMS arrived CBG was 40. EMS administered 25g D10. Last CBG 272, post D10. Pt is now alert and oriented.

## 2019-11-15 ENCOUNTER — Encounter: Payer: Self-pay | Admitting: Internal Medicine

## 2019-11-15 ENCOUNTER — Telehealth: Payer: Self-pay | Admitting: *Deleted

## 2019-11-15 ENCOUNTER — Ambulatory Visit (INDEPENDENT_AMBULATORY_CARE_PROVIDER_SITE_OTHER): Payer: Medicare Other | Admitting: Internal Medicine

## 2019-11-15 ENCOUNTER — Other Ambulatory Visit: Payer: Self-pay

## 2019-11-15 VITALS — BP 138/70 | HR 72 | Temp 97.1°F | Ht 65.0 in | Wt 133.2 lb

## 2019-11-15 DIAGNOSIS — Z89511 Acquired absence of right leg below knee: Secondary | ICD-10-CM | POA: Diagnosis not present

## 2019-11-15 DIAGNOSIS — E1129 Type 2 diabetes mellitus with other diabetic kidney complication: Secondary | ICD-10-CM

## 2019-11-15 DIAGNOSIS — I739 Peripheral vascular disease, unspecified: Secondary | ICD-10-CM

## 2019-11-15 DIAGNOSIS — E11649 Type 2 diabetes mellitus with hypoglycemia without coma: Secondary | ICD-10-CM | POA: Diagnosis not present

## 2019-11-15 DIAGNOSIS — E1165 Type 2 diabetes mellitus with hyperglycemia: Secondary | ICD-10-CM

## 2019-11-15 DIAGNOSIS — IMO0002 Reserved for concepts with insufficient information to code with codable children: Secondary | ICD-10-CM

## 2019-11-15 MED ORDER — GLUCOSE 4 G PO CHEW: 1.0000 | CHEWABLE_TABLET | ORAL | 12 refills | Status: DC | PRN

## 2019-11-15 MED ORDER — ASPIRIN 81 MG PO CHEW: 81.0000 mg | CHEWABLE_TABLET | Freq: Every day | ORAL | 3 refills | Status: DC

## 2019-11-15 NOTE — Patient Instructions (Addendum)
Restart taking baby aspirin.  This is to help with your circulation.  Please bring me your glucometer at your next visit.    Be sure to eat enough!  Eat a snack if you have a long time expected between meals.  Continue on the reduced doses of your insulin.  If you find that your sugars are staying high, let me know.  If you are still having lows, let me know.

## 2019-11-15 NOTE — Telephone Encounter (Signed)
Frankie with [facility] called requesting verbal orders for frequency of Home Health Visits.  Verbal orders given.

## 2019-11-15 NOTE — Progress Notes (Signed)
Location:  [facility] clinic Provider:  Fotios Amos L. Mariea Clonts, D.O., C.M.D.  Code Status: DNR has been discussed with patient  Goals of Care:  Advanced Directives 11/15/2019  Does Patient Have a Medical Advance Directive? Yes  Type of Advance Directive Out of facility DNR (pink MOST or yellow form)  Does patient want to make changes to medical advance directive? No - Patient declined  Would patient like information on creating a medical advance directive? -  Pre-existing out of facility DNR order (yellow form or pink MOST form) -     Chief Complaint  Patient presents with  . Follow-up    4-week followup for diabetic foot ulcer, had amputation  . Quality Metric Gaps    Foot exam performed in the office today    HPI: Patient is a 84 y.o. female seen today for medical management of chronic diseases.    This is our first visit after her right BKA.  She is doing very well from this perspective.  She said that after talking with her surgeon, she thought hard about her options before the surgery and though she was determined not to have the amputation, she talked with God about it and he made her more comfortable with it and she accepted it after that.  She did well with the surgery and has home care nursing and therapy coming out.  She is trying very hard to be independent and even managed to take a bath on her own last night.    Yesterday, she had a hypoglycemic episode again that took her to the ED:  Story goes, she had a little small breakfast real early at 7am.  Her daughter had taken her to the doctor at 1:30pm.  She did not have a snack in b/w at all.  Then they were on the way to [employer] and she said she was sleepy and fell on out on the way there.  They went to the ED.  She's now on 28 units lantus instead of 50 units and 3 units humalog instead of 6 units.    She had not been taking her baby aspirin before b/c of not taking nsaids.  She seems confused about this.    hba1c 8.9 11/2 amid the  stress of her arterial ulcer/PAD/need for amputation and two prior cellulitis episodes.    Past Medical History:  Diagnosis Date  . Acute upper respiratory infections of unspecified site   . Anemia   . Anemia, unspecified   . Atherosclerosis of native arteries of the extremities, unspecified   . Chest pain, unspecified   . Chronic kidney disease (CKD), stage II (mild)   . Diabetes mellitus   . Diarrhea   . Disorder of bone and cartilage, unspecified   . DM (diabetes mellitus) type II controlled with renal manifestation ([place])   . Herpes zoster with other nervous system complications(053.19)   . Hypercalcemia   . Hypertension   . Hypertensive renal disease, benign   . Nonspecific reaction to tuberculin skin test without active tuberculosis(795.51)   . Nonspecific tuberculin test reaction   . Other and unspecified hyperlipidemia   . Pain in joint, lower leg   . Peripheral arterial disease ([place])   . Postherpetic neuralgia   . Proteinuria   . Stroke ([place]) 01/2017  . Type II or unspecified type diabetes mellitus with renal manifestations, not stated as uncontrolled(250.40)   . Type II or unspecified type diabetes mellitus with renal manifestations, uncontrolled(250.42)   . Unspecified  disorder of kidney and ureter   . Unspecified essential hypertension     Past Surgical History:  Procedure Laterality Date  . ABDOMINAL AORTOGRAM W/LOWER EXTREMITY N/A 10/31/2019   Procedure: ABDOMINAL AORTOGRAM W/LOWER EXTREMITY;  Surgeon: Wellington Hampshire, MD;  Location: [place] [facility];  Service: Cardiovascular;  Laterality: N/A;  . AMPUTATION Right 11/02/2019   Procedure: AMPUTATION BELOW KNEE RIGHT;  Surgeon: Rosetta Posner, MD;  Location: [place];  Service: Vascular;  Laterality: Right;  . hysterectomy    . INCISION AND DRAINAGE Left 05/27/14   sebacous cyst, ear  . PRP Left    Dr. Ricki Miller  . removal of cyst from hand    . removal of tumor from foot    . TONSILLECTOMY      Allergies    Allergen Reactions  . Invokana [Canagliflozin] Itching, Swelling and Other (See Comments)    Vaginal itching, swelling and irritation  . Jardiance [Empagliflozin] Itching, Swelling and Other (See Comments)    Vaginal itching and swelling    Outpatient Encounter Medications as of 11/15/2019  Medication Sig  . amLODipine (NORVASC) 10 MG tablet TAKE 1 TABLET (10 MG TOTAL) BY MOUTH DAILY. FOR HIGH BLOOD PRESSURE  . atorvastatin (LIPITOR) 20 MG tablet TAKE 1 TABLET BY MOUTH EVERY DAY  . carvedilol (COREG) 25 MG tablet TAKE 1 TABLET BY MOUTH TWICE A DAY WITH A MEAL  . clopidogrel (PLAVIX) 75 MG tablet TAKE 1 TABLET BY MOUTH EVERY DAY  . HYDROcodone-acetaminophen (NORCO/VICODIN) 5-325 MG tablet Take 1-2 tablets by mouth every 4 (four) hours as needed for moderate pain.  Marland Kitchen insulin glargine (LANTUS) 100 UNIT/ML injection Inject 0.28 mLs (28 Units total) into the skin daily.  . insulin lispro (HUMALOG KWIKPEN) 100 UNIT/ML KwikPen Inject 0.03 mLs (3 Units total) into the skin 3 (three) times daily.  . Insulin Pen Needle (B-D ULTRAFINE III SHORT PEN) 31G X 8 MM MISC Use to check blood sugar every day. Dx: 11.29; 11.65  . ONETOUCH VERIO test strip USE TO TEST BLOOD SUGAR THREE TIMES DAILY. DX: E11.9  . [DISCONTINUED] aspirin 81 MG chewable tablet Chew 1 tablet (81 mg total) by mouth daily.  . [DISCONTINUED] gabapentin (NEURONTIN) 100 MG capsule Take 100 mg by mouth 3 (three) times daily.   No facility-administered encounter medications on file as of 11/15/2019.     Review of Systems:  Review of Systems  Constitutional: Negative for chills, fever and malaise/fatigue.  HENT: Negative for hearing loss.   Eyes: Negative for blurred vision.       Glasses  Respiratory: Negative for cough and shortness of breath.   Cardiovascular: Negative for chest pain, palpitations and leg swelling.  Gastrointestinal: Negative for abdominal pain, blood in stool, constipation, diarrhea and melena.  Genitourinary:  Negative for dysuria.  Musculoskeletal: Negative for falls.       Right phantom pain  Skin: Negative for itching and rash.  Neurological: Positive for tingling and sensory change. Negative for dizziness, focal weakness and loss of consciousness.  Endo/Heme/Allergies: Does not bruise/bleed easily.  Psychiatric/Behavioral: Positive for memory loss. Negative for depression. The patient is not nervous/anxious and does not have insomnia.     Health Maintenance  Topic Date Due  . FOOT EXAM  02/06/2019  . HEMOGLOBIN A1C  04/27/2020  . OPHTHALMOLOGY EXAM  06/26/2020  . INFLUENZA VACCINE  Completed  . DEXA SCAN  Completed  . PNA vac Low Risk Adult  Completed    Physical Exam: Vitals:   11/15/19  1130  BP: 138/70  Pulse: 72  Temp: (!) 97.1 F (36.2 C)  TempSrc: Temporal  SpO2: 98%  Weight: 133 lb 2.6 oz (60.4 kg)  Height: 5\' 5"  (1.651 m)   Body mass index is 22.16 kg/m. Physical Exam Vitals signs reviewed.  Constitutional:      General: She is not in acute distress.    Appearance: Normal appearance. She is not ill-appearing or toxic-appearing.  HENT:     Head: Normocephalic and atraumatic.  Eyes:     Comments: glasses  Cardiovascular:     Rate and Rhythm: Normal rate and regular rhythm.  Pulmonary:     Effort: Pulmonary effort is normal.     Breath sounds: Normal breath sounds. No wheezing, rhonchi or rales.  Abdominal:     General: Bowel sounds are normal.  Musculoskeletal: Normal range of motion.     Right lower leg: No edema.     Left lower leg: No edema.     Comments: Using manual wheelchair  Skin:    General: Skin is warm and dry.     Comments: Right bka incision site with staples intact, no erythema, warmth, drainage or swelling, very clean  Neurological:     General: No focal deficit present.     Mental Status: She is alert and oriented to person, place, and time.     Cranial Nerves: No cranial nerve deficit.  Psychiatric:        Mood and Affect: Mood normal.         Behavior: Behavior normal.     Labs reviewed: Basic Metabolic Panel: Recent Labs    10/29/19 2336  10/31/19 0404 11/01/19 0403  11/04/19 0459 11/07/19 1439 11/13/19 1636  NA  --    < > 132* 132*   < > 133* 134* 133*  K  --    < > 3.3* 3.9   < > 3.9 4.3 3.3*  CL  --    < > 102 103   < > 103 102 101  CO2  --    < > 19* 19*   < > 20* 25 22  GLUCOSE  --    < > 124* 219*   < > 138* 201* 119*  BUN  --    < > 21 24*   < > 23 16 20   CREATININE  --    < > 1.29* 1.37*   < > 1.20* 1.16* 1.27*  CALCIUM  --    < > 10.2 9.9   < > 9.9 9.3 9.6  MG 1.6*  --  1.8 1.7  --   --   --   --   PHOS 2.1*  --   --  1.8*  --   --   --   --   TSH 0.989  --   --   --   --   --   --   --    < > = values in this interval not displayed.   Liver Function Tests: Recent Labs    09/11/19 1136 09/17/19 1518 10/29/19 1309 10/31/19 0404 11/01/19 0403  AST 18 13 26 25   --   ALT 13 8 19 17   --   ALKPHOS 59  --  68 82  --   BILITOT 0.6 0.4 1.1 0.7  --   PROT 6.8 6.7 6.3* 5.9*  --   ALBUMIN 3.4*  --  1.9* 1.7* 1.5*   No results for input(s): LIPASE, AMYLASE in  the last 8760 hours. No results for input(s): AMMONIA in the last 8760 hours. CBC: Recent Labs    11/03/19 0254 11/04/19 1041 11/07/19 1439 11/13/19 1636  WBC 23.0* 19.1* 17.6* 11.3*  NEUTROABS 16.8*  --  13,904* 7.9*  HGB 7.4* 7.8* 7.4* 9.0*  HCT 22.1* 23.7* 22.9* 28.0*  MCV 88.0 86.8 90.9 94.3  PLT 400 443* 406* 374   Lipid Panel: Recent Labs    02/07/19 0900  CHOL 121  HDL 48*  LDLCALC 60  TRIG 53  CHOLHDL 2.5   Lab Results  Component Value Date   HGBA1C 8.9 (H) 10/29/2019    Procedures since last visit: Ct Head Wo Contrast  Result Date: 11/13/2019 CLINICAL DATA:  Encephalopathy, unresponsive episode, concern for stroke or bleed. EXAM: CT HEAD WITHOUT CONTRAST TECHNIQUE: Contiguous axial images were obtained from the base of the skull through the vertex without intravenous contrast. COMPARISON:  Head CT 10/29/2019.  FINDINGS: Brain: No evidence of acute intracranial hemorrhage. No demarcated cortical infarction. No evidence of intracranial mass. No midline shift or extra-axial fluid collection. Mild ill-defined hypoattenuation of the cerebral white matter is nonspecific, but consistent with chronic small vessel ischemic disease. Mild generalized parenchymal atrophy. Vascular: No hyperdense vessel.  Atherosclerotic calcifications. Skull: Normal. Negative for fracture or focal lesion. Sinuses/Orbits: Visualized orbits demonstrate no acute abnormality. Partial opacification of right ethmoid air cells. Mucosal thickening within the partially imaged right maxillary sinus. No significant mastoid effusion. IMPRESSION: 1. No evidence of acute intracranial abnormality. 2. Mild generalized parenchymal atrophy and chronic small vessel ischemic disease. 3. Paranasal sinus disease as described. Correlate for acute sinusitis. Electronically Signed   By: Kellie Simmering DO   On: 11/13/2019 19:24   Ct Head Wo Contrast  Result Date: 10/29/2019 CLINICAL DATA:  Altered mental status EXAM: CT HEAD WITHOUT CONTRAST TECHNIQUE: Contiguous axial images were obtained from the base of the skull through the vertex without intravenous contrast. COMPARISON:  02/17/2017 FINDINGS: Brain: No evidence of acute infarction, hemorrhage, hydrocephalus, extra-axial collection or mass lesion/mass effect. Scattered low-density changes within the periventricular and subcortical white matter compatible with chronic microvascular ischemic change. Mild diffuse cerebral volume loss. Vascular: Mild atherosclerotic calcifications involving the large vessels of the skull base. No unexpected hyperdense vessel. Skull: Normal. Negative for fracture or focal lesion. Sinuses/Orbits: Chronic partial opacification involving a few right ethmoid air cells. Remaining paranasal sinuses and mastoid air cells are clear. Orbital structures within normal limits. Other: None. IMPRESSION:  1.  No acute intracranial findings. 2.  Chronic microvascular ischemic change and cerebral volume loss. Electronically Signed   By: Davina Poke M.D.   On: 10/29/2019 15:03   Dg Foot Complete Left  Result Date: 11/13/2019 Please see detailed radiograph report in office note.  Dg Foot Complete Left  Result Date: 10/17/2019 Please see detailed radiograph report in office note.  Dg Foot Complete Right  Result Date: 10/29/2019 CLINICAL DATA:  Pt c/o right foot infection and diabetes wounds for an unknown period of time. Hx of DM. Patient is scheduled for surgical removal of infection on the right foot. EXAM: RIGHT FOOT COMPLETE - 3+ VIEW COMPARISON:  10/04/2019 FINDINGS: Soft tissue swelling with soft tissue air is noted along the dorsal lateral aspect of the foot, from just distal to the ankle joint to the lateral metatarsophalangeal joints. There is a subtle area apparent loss of the white cortical line along the lateral plantar margin of the cuboid which could reflect osteomyelitis. There is evidence of resorption along the  medial base of the proximal phalanx of the fifth toe which could reflect osteomyelitis. No other evidence of osteomyelitis. No fractures. Joints are normally aligned. Stable first metatarsophalangeal joint osteoarthritis. Small plantar and dorsal calcaneal spurs. There are dense arterial vascular calcifications from the ankle to the foot. IMPRESSION: 1. Right foot infection with soft tissue swelling and soft tissue air along the dorsal lateral aspect of the foot as described. 2. Possible small focus of osteomyelitis along the plantar lateral aspect of the cuboid with another possible area of osteomyelitis along medial base of the proximal phalanx of the fifth toe. No other evidence of osteomyelitis. Electronically Signed   By: Lajean Manes M.D.   On: 10/29/2019 16:56   Vas [place] Burnard Bunting With/wo Tbi  Result Date: 10/17/2019 LOWER EXTREMITY DOPPLER STUDY Indications: Ulceration, and  peripheral artery disease. Patient referred from              [place] for a wound on the top of the foot, between the fourth              and fifth toes on the right. The area is very tender as is the              entire right foot. She thinks the wound began about 6 weeks ago.              Also recently found to have a fracture in the left foot but does              not complain of pain on that side. Also describes claudication              symptoms in the right ankle when she walking from her apartment to              the mailbox in her building, which forces her to stop and rest. High Risk Factors: Hypertension, hyperlipidemia, Diabetes, past history of                    smoking, prior CVA.  Comparison Study: In 11/18, a multilevel arterial Doppler study showed a right                   ABI of 0.83 (overestimated) and the left ABI was                   non-compressible; TBI's were 0.15 on the right and 0.09 on the                   left. Performing Technologist: Mariane Masters RVT  Examination Guidelines: A complete evaluation includes at minimum, Doppler waveform signals and systolic blood pressure reading at the level of bilateral brachial, anterior tibial, and posterior tibial arteries, when vessel segments are accessible. Bilateral testing is considered an integral part of a complete examination. Photoelectric Plethysmograph (PPG) waveforms and toe systolic pressure readings are included as required and additional duplex testing as needed. Limited examinations for reoccurring indications may be performed as noted.  ABI Findings: +---------+------------------+-----+-------------------+--------+ Right    Rt Pressure (mmHg)IndexWaveform           Comment  +---------+------------------+-----+-------------------+--------+ Brachial 171                                                +---------+------------------+-----+-------------------+--------+ ATA  254               1.49 dampened  monophasic         +---------+------------------+-----+-------------------+--------+ PTA      254               1.49 dampened monophasic         +---------+------------------+-----+-------------------+--------+ PERO     254               1.49 dampened monophasic         +---------+------------------+-----+-------------------+--------+ Great Toe0                 0.00 Absent                      +---------+------------------+-----+-------------------+--------+ +---------+------------------+-----+-------------------+-------+ Left     Lt Pressure (mmHg)IndexWaveform           Comment +---------+------------------+-----+-------------------+-------+ Brachial 166                                               +---------+------------------+-----+-------------------+-------+ ATA      254               1.49 dampened monophasic        +---------+------------------+-----+-------------------+-------+ PTA      254               1.49 absent                     +---------+------------------+-----+-------------------+-------+ PERO     254               1.49 dampened monophasic        +---------+------------------+-----+-------------------+-------+ Great Toe0                 0.00 Absent                     +---------+------------------+-----+-------------------+-------+ +-------+-----------+-----------+------------+------------+ ABI/TBIToday's ABIToday's TBIPrevious ABIPrevious TBI +-------+-----------+-----------+------------+------------+ Right  [place]         0          0.83        0.15         +-------+-----------+-----------+------------+------------+ Left   [place]         0          [place]          0.09         +-------+-----------+-----------+------------+------------+ TOES Findings: +----------+---------------+--------+-------+ Right ToesPressure (mmHg)WaveformComment +----------+---------------+--------+-------+ 1st Digit                Absent           +----------+---------------+--------+-------+ 2nd Digit                Absent          +----------+---------------+--------+-------+ 3rd Digit                Absent          +----------+---------------+--------+-------+ 4th Digit                Absent          +----------+---------------+--------+-------+ 5th Digit                Absent          +----------+---------------+--------+-------+  +---------+---------------+--------+-------+ Left ToesPressure (mmHg)WaveformComment +---------+---------------+--------+-------+ 1st Digit  Absent          +---------+---------------+--------+-------+ 2nd Digit               Absent          +---------+---------------+--------+-------+ 3rd Digit               Absent          +---------+---------------+--------+-------+ 4th Digit               Absent          +---------+---------------+--------+-------+ 5th Digit               Absent          +---------+---------------+--------+-------+   Arterial wall calcification precludes accurate ankle pressures and ABIs. Bilateral ABIs and TBIs appear decreased compared to prior study on 10/28/17.  Summary: Right: Resting right ankle-brachial index indicates noncompressible right lower extremity arteries. The right toe-brachial index is abnormal. Waveform analysis is consistent with severe peripheral arterial disease. Left: Resting left ankle-brachial index indicates noncompressible left lower extremity arteries. The left toe-brachial index is abnormal. Waveform analysis is consistent with severe perihperal arterial disease.  *See table(s) above for measurements and observations. See arterial duplex report.  Vascular consult recommended. Patient taken to check out and scheduled with Dr. Fletcher Anon on November 06, 2019. Electronically signed by Larae Grooms MD on 10/17/2019 at 3:14:38 PM.    Final    Vas [place] Lower Extremity Saphenous Vein Mapping  Result Date: 10/31/2019 LOWER  EXTREMITY VEIN MAPPING Indications: pre op bypass  Performing Technologist: June Leap RDMS, RVT  Examination Guidelines: A complete evaluation includes B-mode imaging, spectral Doppler, color Doppler, and power Doppler as needed of all accessible portions of each vessel. Bilateral testing is considered an integral part of a complete examination. Limited examinations for reoccurring indications may be performed as noted. +--------------+-------------+--------------------+-------------+--------------+  RT Diameter   RT Findings         GSV          LT Diameter  LT Findings        (cm)                                          (cm)                    +--------------+-------------+--------------------+-------------+--------------+      0.30                     Saphenofemoral       0.35                                                     Junction                                  +--------------+-------------+--------------------+-------------+--------------+      0.25       branching     Proximal thigh       0.18                    +--------------+-------------+--------------------+-------------+--------------+  not          Mid thigh          0.17                                   visualized                                                  +--------------+-------------+--------------------+-------------+--------------+                    not         Distal thigh                 not visualized                visualized                                                  +--------------+-------------+--------------------+-------------+--------------+                    not             Knee            0.12                                   visualized                                                  +--------------+-------------+--------------------+-------------+--------------+      0.13                       Prox calf          0.13        branching    +--------------+-------------+--------------------+-------------+--------------+      0.12                        Mid calf          0.12                    +--------------+-------------+--------------------+-------------+--------------+ Diagnosing physician: Curt Jews MD Electronically signed by Curt Jews MD on 10/31/2019 at 6:22:37 PM.    Final    Vas [place] Lower Extremity Arterial Duplex  Result Date: 10/17/2019 LOWER EXTREMITY ARTERIAL DUPLEX STUDY Indications: Ulceration, and peripheral artery disease. Patient referred from              [place] for a wound on the top of the foot, between the fourth              and fifth toes on the right. The area is very tender as is the              entire right foot. She thinks the wound began about 6 weeks ago.  Also recently found to have a fracture in the left foot but does              not complain of pain on that side. Also describes claudication              symptoms in the right ankle when she walking from her apartment to              the mailbox in her building, which forces her to stop and rest. High Risk Factors: Hypertension, hyperlipidemia, Diabetes, past history of                    smoking, prior CVA.  Current ABI: Today's ABI's are non-compressible bilaterally. Limitations: Overlying bowel gas in the aorta-iliac portion of exam and calcific              plaque throughout Comparison Study: In 04/2013, an arterial duplex was performed at [facility]                   Cardiovascular which showed no significant stenosis at the                   time. Performing Technologist: Mariane Masters RVT  Examination Guidelines: A complete evaluation includes B-mode imaging, spectral Doppler, color Doppler, and power Doppler as needed of all accessible portions of each vessel. Bilateral testing is considered an integral part of a complete examination. Limited examinations for reoccurring indications may be performed as noted.   +-----------+--------+-----+--------+----------+------------------------------+ RIGHT      PSV cm/sRatioStenosisWaveform  Comments                       +-----------+--------+-----+--------+----------+------------------------------+ CIA Prox   43                   biphasic                                 +-----------+--------+-----+--------+----------+------------------------------+ CIA Mid    79                   biphasic                                 +-----------+--------+-----+--------+----------+------------------------------+ CIA Distal 82                   triphasic                                +-----------+--------+-----+--------+----------+------------------------------+ EIA Prox   86                   biphasic                                 +-----------+--------+-----+--------+----------+------------------------------+ EIA Mid    105                  biphasic                                 +-----------+--------+-----+--------+----------+------------------------------+ EIA Distal 96                   biphasic                                 +-----------+--------+-----+--------+----------+------------------------------+  CFA Prox   67                   biphasic                                 +-----------+--------+-----+--------+----------+------------------------------+ DFA        81                   biphasic                                 +-----------+--------+-----+--------+----------+------------------------------+ SFA Prox   11                   resistant pre-occlusive thump signal     +-----------+--------+-----+--------+----------+------------------------------+ SFA Mid    0            occluded                                         +-----------+--------+-----+--------+----------+------------------------------+ SFA Distal 72                   monophasicreconstituted vs. collateral                                              flow                           +-----------+--------+-----+--------+----------+------------------------------+ POP Prox   13                   monophasic                               +-----------+--------+-----+--------+----------+------------------------------+ POP Distal 21                   monophasic                               +-----------+--------+-----+--------+----------+------------------------------+ TP Trunk   21                   monophasic                               +-----------+--------+-----+--------+----------+------------------------------+ ATA Distal 27                   monophasic                               +-----------+--------+-----+--------+----------+------------------------------+ PTA Distal 0            occluded                                         +-----------+--------+-----+--------+----------+------------------------------+ PERO Distal0            occluded                                         +-----------+--------+-----+--------+----------+------------------------------+  +-----------+--------+-----+--------+----------+--------+  LEFT       PSV cm/sRatioStenosisWaveform  Comments +-----------+--------+-----+--------+----------+--------+ EIA Prox   61                   monophasic         +-----------+--------+-----+--------+----------+--------+ EIA Mid    72                   monophasic         +-----------+--------+-----+--------+----------+--------+ EIA Distal 58                   monophasic         +-----------+--------+-----+--------+----------+--------+ CFA Prox   44                   monophasic         +-----------+--------+-----+--------+----------+--------+ DFA        48                   monophasic         +-----------+--------+-----+--------+----------+--------+ SFA Prox   30                   resistant           +-----------+--------+-----+--------+----------+--------+ SFA Mid    0            occluded                   +-----------+--------+-----+--------+----------+--------+ SFA Distal 0            occluded                   +-----------+--------+-----+--------+----------+--------+ POP Prox   0            occluded                   +-----------+--------+-----+--------+----------+--------+ POP Distal 19                   monophasic         +-----------+--------+-----+--------+----------+--------+ TP Trunk   16                   monophasic         +-----------+--------+-----+--------+----------+--------+ ATA Distal 30                   monophasic         +-----------+--------+-----+--------+----------+--------+ PTA Prox   0            occluded                   +-----------+--------+-----+--------+----------+--------+ PERO Distal17                   monophasic         +-----------+--------+-----+--------+----------+--------+  Aorta: +--------+-------+----------+----------+--------+--------+-----+         AP (cm)Trans (cm)PSV (cm/s)WaveformThrombusShape +--------+-------+----------+----------+--------+--------+-----+ Proximal       2.50      74        biphasic              +--------+-------+----------+----------+--------+--------+-----+ Mid     1.90   2.20      29        biphasic              +--------+-------+----------+----------+--------+--------+-----+ Distal  1.40   1.40      40        biphasic              +--------+-------+----------+----------+--------+--------+-----+  The IVC appears patent.  Summary: Calcific atherosclerosis throughout the aorta and bilateral lower extremity arteries. Right: Total occlusion noted in the superficial femoral artery. Total occlusion noted in the posterior tibial artery. Total occlusion noted in the peroneal artery. Left: Total occlusion noted in the superficial femoral artery. Total occlusion noted  in the superficial femoral artery and/or popliteal artery. Total occlusion noted in the posterior tibial artery. 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  See table(s) above for measurements and observations. Vascular consult recommended. Patient scheduled with Dr. Fletcher Anon on 11/06/19. Electronically signed by Larae Grooms MD on 10/17/2019 at 3:16:13 PM.    Final     Assessment/Plan 1. PAD (peripheral artery disease) ([place]) -ongoing, continues to follow with vascular surgery now and will need intervention on her left leg to improve circulation and prevent future wounds  -is now s/p right BKA due to diabetic/arterial ulcer that was on first MTP region  2. Status post below-knee amputation of right lower extremity ([place]) -doing amazingly well and incision site looks fabulous -cont PT, OT, and RN at home -keep vascular f/u for staple removal  3. Type II diabetes mellitus with renal manifestations, uncontrolled ([place]) -unclear why she stopped baby asa (seems she thought she couldn't take it due to it being an nsaid) and advised to resume -continue lowered doses of insulin and regular cbgs -improve intake so she does not get lows--larger meals before appts to avoid getting lows, snacks in b/w if needed -bring meter to next visit so we can review results and adjust accordingly  4. Hypoglycemia due to type 2 diabetes mellitus ([place]) -had again not eaten enough and for too long span, counseled on her intake again -cont nurse visits and pt to call me if lows or highs remain an issue b/w visits -also to take sugar tablets with her for emergencies  Labs/tests ordered:  Lab Orders  No laboratory test(s) ordered today  none today--needs next full set of labs in February (3 mos from last hba1c which was early in Nov)  Next appt:  02/12/2020   Deran Barro L. Corrina Steffensen,  D.O. [facility] [address], [place] [address] Cell Phone (Mon-Fri 8am-5pm):  [phone] On Call:  [phone] & follow prompts after 5pm & weekends Office Phone:  [phone] Office Fax:  [phone]

## 2019-11-16 DIAGNOSIS — E1151 Type 2 diabetes mellitus with diabetic peripheral angiopathy without gangrene: Secondary | ICD-10-CM | POA: Diagnosis not present

## 2019-11-16 DIAGNOSIS — Z4800 Encounter for change or removal of nonsurgical wound dressing: Secondary | ICD-10-CM | POA: Diagnosis not present

## 2019-11-16 DIAGNOSIS — H8110 Benign paroxysmal vertigo, unspecified ear: Secondary | ICD-10-CM | POA: Diagnosis not present

## 2019-11-16 DIAGNOSIS — I70219 Atherosclerosis of native arteries of extremities with intermittent claudication, unspecified extremity: Secondary | ICD-10-CM | POA: Diagnosis not present

## 2019-11-16 DIAGNOSIS — I129 Hypertensive chronic kidney disease with stage 1 through stage 4 chronic kidney disease, or unspecified chronic kidney disease: Secondary | ICD-10-CM | POA: Diagnosis not present

## 2019-11-16 DIAGNOSIS — D631 Anemia in chronic kidney disease: Secondary | ICD-10-CM | POA: Diagnosis not present

## 2019-11-16 DIAGNOSIS — E785 Hyperlipidemia, unspecified: Secondary | ICD-10-CM | POA: Diagnosis not present

## 2019-11-16 DIAGNOSIS — L97512 Non-pressure chronic ulcer of other part of right foot with fat layer exposed: Secondary | ICD-10-CM | POA: Diagnosis not present

## 2019-11-16 DIAGNOSIS — E11621 Type 2 diabetes mellitus with foot ulcer: Secondary | ICD-10-CM | POA: Diagnosis not present

## 2019-11-16 DIAGNOSIS — E1122 Type 2 diabetes mellitus with diabetic chronic kidney disease: Secondary | ICD-10-CM | POA: Diagnosis not present

## 2019-11-16 DIAGNOSIS — E113593 Type 2 diabetes mellitus with proliferative diabetic retinopathy without macular edema, bilateral: Secondary | ICD-10-CM | POA: Diagnosis not present

## 2019-11-16 DIAGNOSIS — N182 Chronic kidney disease, stage 2 (mild): Secondary | ICD-10-CM | POA: Diagnosis not present

## 2019-11-19 ENCOUNTER — Ambulatory Visit: Payer: Medicare Other

## 2019-11-20 NOTE — Progress Notes (Signed)
Subjective: Monique Cabrera is a 84 y.o. y.o. female who presents for follow up visit. She is accompanied by her daughter, Monique Cabrera on today's visit. [name] has h/o diabetes and PAD. She recently had below knee amputation performed on the right lower extremity due to ischemic wound.    Her daughter Monique Cabrera wanted to meet all of her doctors and ask questions regarding her Mom's care. [name] is currently undergoing therapy at home and is doing well. Pain-wise, she feels much better since she's had the amputation. Daughter admits [name] looks better as well since amputation. She states family is at peace with the outcome now.  [name] also has h/o fracture left 1st metatarsal. Foot is no longer painful per [name].   Reed, Tiffany L, DO is her PCP.   Medications reviewed in chart.  Allergies  Allergen Reactions  . Invokana [Canagliflozin] Itching, Swelling and Other (See Comments)    Vaginal itching, swelling and irritation  . Jardiance [Empagliflozin] Itching, Swelling and Other (See Comments)    Vaginal itching and swelling    Objective:  Vascular Examination: Capillary refill time <3 seconds left foot.  Dorsalis pedis pulses absent left foot.  Posterior tibial pulses left foot.  No digital hair left foot.  No edema.   Skin temperature warm to cool left foot.  Dermatological Examination: Skin thin, shiny and atrophic left foot.   Toenails 1-5 left foot adequate length. Nail polish on toenails 1-5. No erythema, no edema, no open wounds.   Musculoskeletal: HAV with bunion left foot.  No pain/edema noted at fracture site.  Xrays left foot: Displaced fracture left 1st met base/shaft unchanged No gas in tissues +Vessel calcification  Assessment: 1. Displaced fracture left 1st metatarsal left foot 2. S/p below knee amputation RLE 3. NIDDM with Peripheral arterial disease  Plan: 1. Discussed xrays of left foot with [name] and daughter. 2. Her xrays are  unchanged. She is currently asymptomatic. Discussed with daughter we will only perform new xrays if she becomes symptomatic. Daughter and [name] related understanding. 3. Educated [name] and daughter on signs/symptoms which would require her to seek evaluation by her vascular specialist immediately: discoloration/coolness of toes, pain, drainage, odor. They related understanding. 4. She is not a candidate for ORIF of fracture due to severe PAD. 5. Follow up 1 month for toenail trimming. Avoid self trimming due to use of blood thinner/PAD. 6. Patient to continue soft, supportive shoe gear daily. 7. Patient to report any pedal injuries to medical professional immediately. 8. Follow up 1 month for nail trimming.  9. Patient/POA to call should there be a concern in the interim.

## 2019-11-21 ENCOUNTER — Other Ambulatory Visit: Payer: Self-pay

## 2019-11-21 DIAGNOSIS — D631 Anemia in chronic kidney disease: Secondary | ICD-10-CM | POA: Diagnosis not present

## 2019-11-21 DIAGNOSIS — I129 Hypertensive chronic kidney disease with stage 1 through stage 4 chronic kidney disease, or unspecified chronic kidney disease: Secondary | ICD-10-CM | POA: Diagnosis not present

## 2019-11-21 DIAGNOSIS — L97512 Non-pressure chronic ulcer of other part of right foot with fat layer exposed: Secondary | ICD-10-CM | POA: Diagnosis not present

## 2019-11-21 DIAGNOSIS — E1122 Type 2 diabetes mellitus with diabetic chronic kidney disease: Secondary | ICD-10-CM | POA: Diagnosis not present

## 2019-11-21 DIAGNOSIS — E113593 Type 2 diabetes mellitus with proliferative diabetic retinopathy without macular edema, bilateral: Secondary | ICD-10-CM | POA: Diagnosis not present

## 2019-11-21 DIAGNOSIS — N182 Chronic kidney disease, stage 2 (mild): Secondary | ICD-10-CM | POA: Diagnosis not present

## 2019-11-21 DIAGNOSIS — E1151 Type 2 diabetes mellitus with diabetic peripheral angiopathy without gangrene: Secondary | ICD-10-CM | POA: Diagnosis not present

## 2019-11-21 DIAGNOSIS — H8110 Benign paroxysmal vertigo, unspecified ear: Secondary | ICD-10-CM | POA: Diagnosis not present

## 2019-11-21 DIAGNOSIS — E11621 Type 2 diabetes mellitus with foot ulcer: Secondary | ICD-10-CM | POA: Diagnosis not present

## 2019-11-21 DIAGNOSIS — I70219 Atherosclerosis of native arteries of extremities with intermittent claudication, unspecified extremity: Secondary | ICD-10-CM | POA: Diagnosis not present

## 2019-11-21 DIAGNOSIS — E785 Hyperlipidemia, unspecified: Secondary | ICD-10-CM | POA: Diagnosis not present

## 2019-11-21 DIAGNOSIS — Z4800 Encounter for change or removal of nonsurgical wound dressing: Secondary | ICD-10-CM | POA: Diagnosis not present

## 2019-11-21 NOTE — Patient Outreach (Signed)
[facility]) Care Management  11/21/2019  LORENIA HOSTON Apr 12, 1935 [id number]   Follow up call to patient's daughter regarding status of pcs.  Per daughter, [facility] was contacted to request that [facility] provide service, however, patient has not been contacted. Called [facility] to clarify.  [facility] representative did state that patient has been authorized for services through [place]. Called and left a message for [name], supervisor for [name] to inquire about status of services.  Requested that she call me and/or daughter back.   Called daughter to provide update and to provide contact number for Antionette with [name] in case return call is not received.  Will follow up with daughter again next week if no return call from [place] before then   [employer], [place] Worker [phone]

## 2019-11-23 DIAGNOSIS — L97512 Non-pressure chronic ulcer of other part of right foot with fat layer exposed: Secondary | ICD-10-CM | POA: Diagnosis not present

## 2019-11-23 DIAGNOSIS — E11621 Type 2 diabetes mellitus with foot ulcer: Secondary | ICD-10-CM | POA: Diagnosis not present

## 2019-11-23 DIAGNOSIS — D631 Anemia in chronic kidney disease: Secondary | ICD-10-CM | POA: Diagnosis not present

## 2019-11-23 DIAGNOSIS — Z4800 Encounter for change or removal of nonsurgical wound dressing: Secondary | ICD-10-CM | POA: Diagnosis not present

## 2019-11-23 DIAGNOSIS — E113593 Type 2 diabetes mellitus with proliferative diabetic retinopathy without macular edema, bilateral: Secondary | ICD-10-CM | POA: Diagnosis not present

## 2019-11-23 DIAGNOSIS — E785 Hyperlipidemia, unspecified: Secondary | ICD-10-CM | POA: Diagnosis not present

## 2019-11-23 DIAGNOSIS — I129 Hypertensive chronic kidney disease with stage 1 through stage 4 chronic kidney disease, or unspecified chronic kidney disease: Secondary | ICD-10-CM | POA: Diagnosis not present

## 2019-11-23 DIAGNOSIS — E1122 Type 2 diabetes mellitus with diabetic chronic kidney disease: Secondary | ICD-10-CM | POA: Diagnosis not present

## 2019-11-23 DIAGNOSIS — I70219 Atherosclerosis of native arteries of extremities with intermittent claudication, unspecified extremity: Secondary | ICD-10-CM | POA: Diagnosis not present

## 2019-11-23 DIAGNOSIS — N182 Chronic kidney disease, stage 2 (mild): Secondary | ICD-10-CM | POA: Diagnosis not present

## 2019-11-23 DIAGNOSIS — H8110 Benign paroxysmal vertigo, unspecified ear: Secondary | ICD-10-CM | POA: Diagnosis not present

## 2019-11-23 DIAGNOSIS — E1151 Type 2 diabetes mellitus with diabetic peripheral angiopathy without gangrene: Secondary | ICD-10-CM | POA: Diagnosis not present

## 2019-11-26 ENCOUNTER — Telehealth: Payer: Self-pay

## 2019-11-26 ENCOUNTER — Other Ambulatory Visit: Payer: Self-pay

## 2019-11-26 NOTE — Patient Outreach (Signed)
[facility]) Care Management  11/26/2019  Monique Cabrera August 17, 1935 [id number]   Follow up call to patient's daughter today.  She has not received a return call from [place] with [place] regarding the status of pcs.  Daughter reported that she is planning to visit patient today and will attempt to connect with [name] while there.  Told daughter that I will also attempt to reach her again.  Will follow up with daughter again within the next couple of days.  Will contact [place] office if no return call from [place] before then.   Ronn Melena, BSW Social Worker [phone]

## 2019-11-26 NOTE — Telephone Encounter (Signed)
Patient and daughter called today and stated patient would like referral to diabetic specialist to help with control of diabetes. Informed patient she would need an appointment to speak with provider. Patient and daughter were in agreement with scheduling an appointment for tele-visit. Scheduled an appointment for patient for referral.

## 2019-11-27 ENCOUNTER — Encounter: Payer: Self-pay | Admitting: Family

## 2019-11-27 ENCOUNTER — Other Ambulatory Visit: Payer: Self-pay | Admitting: *Deleted

## 2019-11-27 ENCOUNTER — Encounter: Payer: Self-pay | Admitting: *Deleted

## 2019-11-27 ENCOUNTER — Ambulatory Visit (INDEPENDENT_AMBULATORY_CARE_PROVIDER_SITE_OTHER): Payer: Medicare Other | Admitting: Family

## 2019-11-27 ENCOUNTER — Other Ambulatory Visit: Payer: Self-pay

## 2019-11-27 VITALS — Ht 65.0 in | Wt 133.2 lb

## 2019-11-27 DIAGNOSIS — I129 Hypertensive chronic kidney disease with stage 1 through stage 4 chronic kidney disease, or unspecified chronic kidney disease: Secondary | ICD-10-CM | POA: Diagnosis not present

## 2019-11-27 DIAGNOSIS — Z4800 Encounter for change or removal of nonsurgical wound dressing: Secondary | ICD-10-CM | POA: Diagnosis not present

## 2019-11-27 DIAGNOSIS — E11621 Type 2 diabetes mellitus with foot ulcer: Secondary | ICD-10-CM | POA: Diagnosis not present

## 2019-11-27 DIAGNOSIS — I70219 Atherosclerosis of native arteries of extremities with intermittent claudication, unspecified extremity: Secondary | ICD-10-CM | POA: Diagnosis not present

## 2019-11-27 DIAGNOSIS — E113593 Type 2 diabetes mellitus with proliferative diabetic retinopathy without macular edema, bilateral: Secondary | ICD-10-CM | POA: Diagnosis not present

## 2019-11-27 DIAGNOSIS — E11649 Type 2 diabetes mellitus with hypoglycemia without coma: Secondary | ICD-10-CM | POA: Diagnosis not present

## 2019-11-27 DIAGNOSIS — E785 Hyperlipidemia, unspecified: Secondary | ICD-10-CM | POA: Diagnosis not present

## 2019-11-27 DIAGNOSIS — L97512 Non-pressure chronic ulcer of other part of right foot with fat layer exposed: Secondary | ICD-10-CM | POA: Diagnosis not present

## 2019-11-27 DIAGNOSIS — D631 Anemia in chronic kidney disease: Secondary | ICD-10-CM | POA: Diagnosis not present

## 2019-11-27 DIAGNOSIS — E1122 Type 2 diabetes mellitus with diabetic chronic kidney disease: Secondary | ICD-10-CM | POA: Diagnosis not present

## 2019-11-27 DIAGNOSIS — H8110 Benign paroxysmal vertigo, unspecified ear: Secondary | ICD-10-CM | POA: Diagnosis not present

## 2019-11-27 DIAGNOSIS — E1151 Type 2 diabetes mellitus with diabetic peripheral angiopathy without gangrene: Secondary | ICD-10-CM | POA: Diagnosis not present

## 2019-11-27 DIAGNOSIS — N182 Chronic kidney disease, stage 2 (mild): Secondary | ICD-10-CM | POA: Diagnosis not present

## 2019-11-27 NOTE — Progress Notes (Signed)
This service is provided via telemedicine  No vital signs collected/recorded due to the encounter was a telemedicine visit.   Location of patient (ex: home, work):  Home  Patient consents to a telephone visit:  Yes  Location of the provider (ex: office, home):  [place] office  Name of any referring provider:  Hollace Kinnier, DO  Names of all persons participating in the telemedicine service and their role in the encounter:  Bonney Leitz, [place]; Marlowe Sax, NP; patient.   Time spent on call:  18.32 minutes - CMA time only   Provider: [name] Ngetich FNP-C  [name], DO  Patient Care Team: [name], DO as PCP - General (Geriatric Medicine) Wellington Hampshire, MD as PCP - Cardiology (Cardiology) Adrian Prows, MD as Consulting Physician (Cardiology) Rutherford Guys, MD as Consulting Physician (Ophthalmology) Justin Mend, MD as Attending Physician (Internal Medicine) Kassie Mends, RN as [place] [facility] [place], Museum/gallery conservator as [place] Management  Extended Emergency Contact Information Primary Emergency Contact: [place] Address: [address]          [place], [place] [address] Johnnette Litter of [place] Phone: [phone] Relation: Daughter Secondary Emergency Contact: Kimberlee Nearing [place] of [place] Phone: [phone] Relation: Daughter  Code Status:  DNR Goals of care: Advanced Directive information Advanced Directives 11/27/2019  Does Patient Have a Medical Advance Directive? Yes  Type of Advance Directive Out of facility DNR (pink MOST or yellow form)  Does patient want to make changes to medical advance directive? No - Patient declined  Would patient like information on creating a medical advance directive? -  Pre-existing out of facility DNR order (yellow form or pink MOST form) -     Chief Complaint  Patient presents with  . Medical Management of Chronic Issues    Would like referral to a diabetic  specialist. A1c was 8/9 on 10/29/19.     HPI:  Pt is a 84 y.o. female seen today for an acute visit for evaluation of low blood sugars.she states her blood sugars have been running low.CBG in the mornings are in the 190's.she has also had low CBG readings and increased sleepiness.she has woken up several times being surrounded by EMS trying to wake her up.she states does not feel hungry so doesn't eat.If she wakes up late she will skip breakfast.she states eats lunch around 2 or 4 pm at times.she also likes to eat candy sometimes eats a whole bag then stays about a month before eating candy.she is currently on lantus 28 units SQ daily in the morning and Humalog 3 units SQ three times with meals.also has glucose 4 GM chewable tablet as needed for low blood sugars.she is status post right BKA done 11/02/2019 due to right foot gangrene.she has follow up appointment for staples removed withDr.[facility].Pain under control with Tylenol. She denies any acute illness.states feel good overall.       Past Medical History:  Diagnosis Date  . Acute upper respiratory infections of unspecified site   . Anemia   . Anemia, unspecified   . Atherosclerosis of native arteries of the extremities, unspecified   . Chest pain, unspecified   . Chronic kidney disease (CKD), stage II (mild)   . Diabetes mellitus   . Diarrhea   . Disorder of bone and cartilage, unspecified   . DM (diabetes mellitus) type II controlled with renal manifestation ([place])   . Herpes zoster with other nervous system complications(053.19)   .  Hypercalcemia   . Hypertension   . Hypertensive renal disease, benign   . Nonspecific reaction to tuberculin skin test without active tuberculosis(795.51)   . Nonspecific tuberculin test reaction   . Other and unspecified hyperlipidemia   . Pain in joint, lower leg   . Peripheral arterial disease ([place])   . Postherpetic neuralgia   . Proteinuria   . Stroke ([place]) 01/2017  . Type II or  unspecified type diabetes mellitus with renal manifestations, not stated as uncontrolled(250.40)   . Type II or unspecified type diabetes mellitus with renal manifestations, uncontrolled(250.42)   . Unspecified disorder of kidney and ureter   . Unspecified essential hypertension    Past Surgical History:  Procedure Laterality Date  . ABDOMINAL AORTOGRAM W/LOWER EXTREMITY N/A 10/31/2019   Procedure: ABDOMINAL AORTOGRAM W/LOWER EXTREMITY;  Surgeon: Wellington Hampshire, MD;  Location: [facility];  Service: Cardiovascular;  Laterality: N/A;  . AMPUTATION Right 11/02/2019   Procedure: AMPUTATION BELOW KNEE RIGHT;  Surgeon: Rosetta Posner, MD;  Location: [facility];  Service: Vascular;  Laterality: Right;  . hysterectomy    . INCISION AND DRAINAGE Left 05/27/14   sebacous cyst, ear  . PRP Left    Dr. Ricki Miller  . removal of cyst from hand    . removal of tumor from foot    . TONSILLECTOMY      Allergies  Allergen Reactions  . Invokana [Canagliflozin] Itching, Swelling and Other (See Comments)    Vaginal itching, swelling and irritation  . Jardiance [Empagliflozin] Itching, Swelling and Other (See Comments)    Vaginal itching and swelling    Outpatient Encounter Medications as of 11/27/2019  Medication Sig  . amLODipine (NORVASC) 10 MG tablet TAKE 1 TABLET (10 MG TOTAL) BY MOUTH DAILY. FOR HIGH BLOOD PRESSURE  . aspirin 81 MG chewable tablet Chew 1 tablet (81 mg total) by mouth daily.  Marland Kitchen atorvastatin (LIPITOR) 20 MG tablet TAKE 1 TABLET BY MOUTH EVERY DAY  . carvedilol (COREG) 25 MG tablet TAKE 1 TABLET BY MOUTH TWICE A DAY WITH A MEAL  . clopidogrel (PLAVIX) 75 MG tablet TAKE 1 TABLET BY MOUTH EVERY DAY  . glucose 4 GM chewable tablet Chew 1 tablet (4 g total) by mouth as needed for low blood sugar.  . HYDROcodone-acetaminophen (NORCO/VICODIN) 5-325 MG tablet Take 1-2 tablets by mouth every 4 (four) hours as needed for moderate pain.  Marland Kitchen insulin glargine (LANTUS) 100 UNIT/ML injection  Inject 0.28 mLs (28 Units total) into the skin daily.  . insulin lispro (HUMALOG KWIKPEN) 100 UNIT/ML KwikPen Inject 0.03 mLs (3 Units total) into the skin 3 (three) times daily.  . Insulin Pen Needle (B-D ULTRAFINE III SHORT PEN) 31G X 8 MM MISC Use to check blood sugar every day. Dx: 11.29; 11.65  . ONETOUCH VERIO test strip USE TO TEST BLOOD SUGAR THREE TIMES DAILY. DX: E11.9   No facility-administered encounter medications on file as of 11/27/2019.     Review of Systems  Constitutional: Negative for appetite change, chills, fatigue and fever.  HENT: Negative for congestion, rhinorrhea, sinus pressure, sinus pain, sneezing and sore throat.   Eyes: Negative for discharge, redness and itching.  Respiratory: Negative for cough, chest tightness, shortness of breath and wheezing.   Cardiovascular: Negative for chest pain, palpitations and leg swelling.  Gastrointestinal: Negative for abdominal distention, abdominal pain, constipation, diarrhea, nausea and vomiting.  Endocrine: Negative for polydipsia, polyphagia and polyuria.  Genitourinary: Negative for difficulty urinating, dysuria, flank pain, frequency and  urgency.  Musculoskeletal:       Self propel on wheelchair status post right BKA   Skin: Negative for color change, pallor and rash.       Right BKA staples states no drainage or redness.  Neurological: Negative for dizziness, light-headedness and headaches.  Psychiatric/Behavioral: Negative for agitation, confusion and sleep disturbance. The patient is not nervous/anxious.     Immunization History  Administered Date(s) Administered  . Fluad Quad(high Dose 65+) 08/23/2019  . Influenza, High Dose Seasonal PF 09/30/2018  . Influenza,inj,Quad PF,6+ Mos 09/24/2013, 09/19/2014, 09/13/2016  . Influenza-Unspecified 11/22/2009, 10/01/2011, 09/10/2015, 09/26/2017  . PPD Test 01/12/2010  . Pneumococcal Conjugate-13 11/24/2015  . Pneumococcal Polysaccharide-23 12/27/2005   Pertinent   Health Maintenance Due  Topic Date Due  . HEMOGLOBIN A1C  04/27/2020  . OPHTHALMOLOGY EXAM  07/25/2020  . FOOT EXAM  11/14/2020  . INFLUENZA VACCINE  Completed  . DEXA SCAN  Completed  . PNA vac Low Risk Adult  Completed   Fall Risk  11/27/2019 11/15/2019 11/07/2019 10/17/2019 10/09/2019  Falls in the past year? 1 0 1 0 0  Number falls in past yr: 1 - 0 0 0  Comment New amputee, not used to not being able to get up on her own - - - -  Injury with Fall? 0 - 0 - 0  Risk for fall due to : - - - Impaired balance/gait;Medication side effect -  Follow up - - - - -    Vitals:   11/27/19 0848  Weight: 133 lb 2.6 oz (60.4 kg)  Height: 5\' 5"  (1.651 m)   Body mass index is 22.16 kg/m. Physical Exam  Unable to complete on telephone visit.   Labs reviewed: Recent Labs    10/29/19 2336  10/31/19 0404 11/01/19 0403  11/04/19 0459 11/07/19 1439 11/13/19 1636  NA  --    < > 132* 132*   < > 133* 134* 133*  K  --    < > 3.3* 3.9   < > 3.9 4.3 3.3*  CL  --    < > 102 103   < > 103 102 101  CO2  --    < > 19* 19*   < > 20* 25 22  GLUCOSE  --    < > 124* 219*   < > 138* 201* 119*  BUN  --    < > 21 24*   < > 23 16 20   CREATININE  --    < > 1.29* 1.37*   < > 1.20* 1.16* 1.27*  CALCIUM  --    < > 10.2 9.9   < > 9.9 9.3 9.6  MG 1.6*  --  1.8 1.7  --   --   --   --   PHOS 2.1*  --   --  1.8*  --   --   --   --    < > = values in this interval not displayed.   Recent Labs    09/11/19 1136 09/17/19 1518 10/29/19 1309 10/31/19 0404 11/01/19 0403  AST 18 13 26 25   --   ALT 13 8 19 17   --   ALKPHOS 59  --  68 82  --   BILITOT 0.6 0.4 1.1 0.7  --   PROT 6.8 6.7 6.3* 5.9*  --   ALBUMIN 3.4*  --  1.9* 1.7* 1.5*   Recent Labs    11/03/19 0254 11/04/19 1041 11/07/19 1439 11/13/19 1636  WBC 23.0* 19.1* 17.6* 11.3*  NEUTROABS 16.8*  --  13,904* 7.9*  HGB 7.4* 7.8* 7.4* 9.0*  HCT 22.1* 23.7* 22.9* 28.0*  MCV 88.0 86.8 90.9 94.3  PLT 400 443* 406* 374   Lab Results  Component  Value Date   TSH 0.989 10/29/2019   Lab Results  Component Value Date   HGBA1C 8.9 (H) 10/29/2019   Lab Results  Component Value Date   CHOL 121 02/07/2019   HDL 48 (L) 02/07/2019   LDLCALC 60 02/07/2019   TRIG 53 02/07/2019   CHOLHDL 2.5 02/07/2019    Significant Diagnostic Results in last 30 days:  Ct Head Wo Contrast  Result Date: 11/13/2019 CLINICAL DATA:  Encephalopathy, unresponsive episode, concern for stroke or bleed. EXAM: CT HEAD WITHOUT CONTRAST TECHNIQUE: Contiguous axial images were obtained from the base of the skull through the vertex without intravenous contrast. COMPARISON:  Head CT 10/29/2019. FINDINGS: Brain: No evidence of acute intracranial hemorrhage. No demarcated cortical infarction. No evidence of intracranial mass. No midline shift or extra-axial fluid collection. Mild ill-defined hypoattenuation of the cerebral white matter is nonspecific, but consistent with chronic small vessel ischemic disease. Mild generalized parenchymal atrophy. Vascular: No hyperdense vessel.  Atherosclerotic calcifications. Skull: Normal. Negative for fracture or focal lesion. Sinuses/Orbits: Visualized orbits demonstrate no acute abnormality. Partial opacification of right ethmoid air cells. Mucosal thickening within the partially imaged right maxillary sinus. No significant mastoid effusion. IMPRESSION: 1. No evidence of acute intracranial abnormality. 2. Mild generalized parenchymal atrophy and chronic small vessel ischemic disease. 3. Paranasal sinus disease as described. Correlate for acute sinusitis. Electronically Signed   By: Kellie Simmering DO   On: 11/13/2019 19:24   Ct Head Wo Contrast  Result Date: 10/29/2019 CLINICAL DATA:  Altered mental status EXAM: CT HEAD WITHOUT CONTRAST TECHNIQUE: Contiguous axial images were obtained from the base of the skull through the vertex without intravenous contrast. COMPARISON:  02/17/2017 FINDINGS: Brain: No evidence of acute infarction,  hemorrhage, hydrocephalus, extra-axial collection or mass lesion/mass effect. Scattered low-density changes within the periventricular and subcortical white matter compatible with chronic microvascular ischemic change. Mild diffuse cerebral volume loss. Vascular: Mild atherosclerotic calcifications involving the large vessels of the skull base. No unexpected hyperdense vessel. Skull: Normal. Negative for fracture or focal lesion. Sinuses/Orbits: Chronic partial opacification involving a few right ethmoid air cells. Remaining paranasal sinuses and mastoid air cells are clear. Orbital structures within normal limits. Other: None. IMPRESSION: 1.  No acute intracranial findings. 2.  Chronic microvascular ischemic change and cerebral volume loss. Electronically Signed   By: Davina Poke M.D.   On: 10/29/2019 15:03   Dg Foot Complete Left  Result Date: 11/13/2019 Please see detailed radiograph report in office note.  Dg Foot Complete Right  Result Date: 10/29/2019 CLINICAL DATA:  Pt c/o right foot infection and diabetes wounds for an unknown period of time. Hx of DM. Patient is scheduled for surgical removal of infection on the right foot. EXAM: RIGHT FOOT COMPLETE - 3+ VIEW COMPARISON:  10/04/2019 FINDINGS: Soft tissue swelling with soft tissue air is noted along the dorsal lateral aspect of the foot, from just distal to the ankle joint to the lateral metatarsophalangeal joints. There is a subtle area apparent loss of the white cortical line along the lateral plantar margin of the cuboid which could reflect osteomyelitis. There is evidence of resorption along the medial base of the proximal phalanx of the fifth toe which could reflect osteomyelitis. No other evidence of osteomyelitis. No fractures.  Joints are normally aligned. Stable first metatarsophalangeal joint osteoarthritis. Small plantar and dorsal calcaneal spurs. There are dense arterial vascular calcifications from the ankle to the foot.  IMPRESSION: 1. Right foot infection with soft tissue swelling and soft tissue air along the dorsal lateral aspect of the foot as described. 2. Possible small focus of osteomyelitis along the plantar lateral aspect of the cuboid with another possible area of osteomyelitis along medial base of the proximal phalanx of the fifth toe. No other evidence of osteomyelitis. Electronically Signed   By: Lajean Manes M.D.   On: 10/29/2019 16:56   Vas [place] Lower Extremity Saphenous Vein Mapping  Result Date: 10/31/2019 LOWER EXTREMITY VEIN MAPPING Indications: pre op bypass  Performing Technologist: June Leap RDMS, RVT  Examination Guidelines: A complete evaluation includes B-mode imaging, spectral Doppler, color Doppler, and power Doppler as needed of all accessible portions of each vessel. Bilateral testing is considered an integral part of a complete examination. Limited examinations for reoccurring indications may be performed as noted. +--------------+-------------+--------------------+-------------+--------------+  RT Diameter   RT Findings         GSV          LT Diameter  LT Findings        (cm)                                          (cm)                    +--------------+-------------+--------------------+-------------+--------------+      0.30                     Saphenofemoral       0.35                                                     Junction                                  +--------------+-------------+--------------------+-------------+--------------+      0.25       branching     Proximal thigh       0.18                    +--------------+-------------+--------------------+-------------+--------------+                    not          Mid thigh          0.17                                   visualized                                                  +--------------+-------------+--------------------+-------------+--------------+                    not          Distal thigh  not visualized                visualized                                                  +--------------+-------------+--------------------+-------------+--------------+                    not             Knee            0.12                                   visualized                                                  +--------------+-------------+--------------------+-------------+--------------+      0.13                       Prox calf          0.13       branching    +--------------+-------------+--------------------+-------------+--------------+      0.12                        Mid calf          0.12                    +--------------+-------------+--------------------+-------------+--------------+ Diagnosing physician: Curt Jews MD Electronically signed by Curt Jews MD on 10/31/2019 at 6:22:37 PM.    Final     Assessment/Plan   Hypoglycemia due to type 2 diabetes mellitus ([place]) Reports CBG readings in the 40's she was seen in the ED 11/13/2019 with low blood sugars.also reports several times being woken up by EMS.she has someone living with her now.she is not consistent with her meals and continues to take insulin.Highest CBG reported was 195 in the morning.I've discussed with patient to eat meals on regular time to be consistent with her insulin administration.also to avoid just eating a whole bag of candy instead of meals. - will cut down Lantus from 28 units to 10 units SQ daily in the morning.will need further reduction post her right BKA but will monitor for now. - continue on Humalog 3 units SQ three times with meals.Instructed to use if CBG > 150. She verbalized understanding patient repeated back the instruction. - Encouraged to write down her CBG and notify provider if readings are consistently above 200.will consider referral to Nutritionist if she continues to be inconsistent with her meals.  - recommended follow up in 1  week but states has upcoming appointment with Dr.Reed 12/13/2019.  Family/ staff Communication: Reviewed plan of care with patient.  Labs/tests ordered: None   Spent 19 minutes of non-face to face with patient   Sandrea Hughs, NP

## 2019-11-27 NOTE — Patient Instructions (Signed)
-   Reduce Lantus from 28 units to 10 units SQ daily in the morning.  - continue on Humalog 3 units SQ three times with meals  if blood sugar is  > 150.  - Notify provider's office if blood sugars are > 200

## 2019-11-27 NOTE — Patient Outreach (Signed)
Outreach call to pt for telephone assessment, spoke with pt, HIPAA verified, pt reports she went to ED on 11/13/19 for hypoglycemia and has had several low readings 40-50's over past few weeks, pt states she has tele-visit with primary MD today regarding this issue and getting referral to endocrinologist and this is in process.  Pt reports CBG vary from 100-200's range most days, pt states amputation site has healed well, pt reports she is in [facility] most of time, has all medications and taking as prescribed, now has assistance 1.5 hours daily, 3 days per week and her adult children continue to assist and grandchildren spend the night.  Pt states she is trying to do better about eating 3 meals per day as she has been skipping lunch some days.  THN CM Care Plan Problem One     Most Recent Value  Care Plan Problem One  Knowledge deficit related to self management of diabetes  Role Documenting the Problem One  Care Management Coordinator  Care Plan for Problem One  Active  THN Long Term Goal   Pt will verbalize, demonstrate improved self care for diabetes management within 60 days aeb better glucose control and wound healing  THN Long Term Goal Start Date  10/17/19  Interventions for Problem One Long Term Goal  RN CM reinforced plan of care with pt, reviewed instructions from MD tele-visit today, decrease lantus from 28 units to 10 units SQ daily in am, Continue humalog 3 units SQ TID with meals if CBG >150, Notify provider office if CBG >200.  THN CM Short Term Goal #1   Pt will verbalize plate method within 30 days  THN CM Short Term Goal #1 Start Date  10/17/19  [facility] CM Short Term Goal #1 Met Date  11/27/19  Interventions for Short Term Goal #1  RN CM reviewed plate method, healthy food choices.  THN CM Short Term Goal #2   Pt will verbalize signs/ symptoms hypoglycemia and actions to take within 30 days  THN CM Short Term Goal #2 Start Date  10/17/19 [goal re-established]  Interventions for Short Term  Goal #2  RN CM reviewed signs/ symptoms hypoglycemia, reviewed importance of keeping glucose tablets nearby as well as food, snacks, eat regularly throughout the day, do not skip meals.  THN CM Short Term Goal #3  Pt will show improvement in wound healing within 30 days  THN CM Short Term Goal #3 Start Date  10/17/19  [facility] CM Short Term Goal #3 Met Date  11/27/19  Interventions for Short Tern Goal #3  Pt states incision is unremarkable and she will have staples removed on 12/04/19, pt reports incision is well approximated, no drainage, no signs/symptoms infection.     THN CM Care Plan Problem Two     Most Recent Value  Care Plan Problem Two  High risk for falls  Role Documenting the Problem Two  Care Management Coordinator  Care Plan for Problem Two  Active  THN CM Short Term Goal #1   Pt will report no falls and adhere to safety precautions within 30 days  THN CM Short Term Goal #1 Start Date  10/17/19  [facility] CM Short Term Goal #1 Met Date   11/27/19  Interventions for Short Term Goal #2   RN CM reviewed safety precautions, importance of using DME and asking for assistance as needed.      PLAN Outreach pt for telephone assessment next month Assess CBG readings Ask if pt has appointment  with endocrinologist  Jacqlyn Larsen [name], BSN [place] Coordinator [phone]

## 2019-11-28 ENCOUNTER — Other Ambulatory Visit: Payer: Self-pay

## 2019-11-28 NOTE — Patient Outreach (Signed)
[facility]) Care Management  11/28/2019  [name] [birth year] [phone]   Follow up call to patient's daughter regarding status of personal care services.  Patient started receiving pcs through [facility] on 11/27/19. She is receiving 1.5 hours/3 days per week. Per daughter, patient was informed that she is being discharged from PT with [facility].  Daughter has some concerns about patient being discharged from service.  She requested that I call Advance Therapist, Tharon Aquas, to inquire about this.  Chance. Was informed that patient was discharged from PT because she has not yet received her prosthesis so maximum benefit of therapy has been achieved at this point.  Called daughter back to inform her of this.  Encouraged daughter to talk with MD about new order for PT once prosthesis is received. Closing social work case at this time.  Ronn Melena, BSW Social Worker [phone]

## 2019-12-04 ENCOUNTER — Encounter: Payer: Self-pay | Admitting: Vascular Surgery

## 2019-12-04 ENCOUNTER — Other Ambulatory Visit: Payer: Self-pay

## 2019-12-04 ENCOUNTER — Ambulatory Visit (INDEPENDENT_AMBULATORY_CARE_PROVIDER_SITE_OTHER): Payer: Self-pay | Admitting: Vascular Surgery

## 2019-12-04 VITALS — BP 137/71 | HR 74 | Temp 97.3°F | Resp 20 | Ht 65.0 in | Wt 133.0 lb

## 2019-12-04 DIAGNOSIS — I639 Cerebral infarction, unspecified: Secondary | ICD-10-CM | POA: Diagnosis not present

## 2019-12-04 DIAGNOSIS — I96 Gangrene, not elsewhere classified: Secondary | ICD-10-CM | POA: Diagnosis not present

## 2019-12-04 DIAGNOSIS — Z89511 Acquired absence of right leg below knee: Secondary | ICD-10-CM

## 2019-12-04 NOTE — Progress Notes (Signed)
Monique Cabrera, Monique Cabrera ([id number]) Visit Report for 10/08/2019 Arrival Information Details Patient Name: Date of Service: Monique Cabrera, Monique Cabrera 10/08/2019 1:45 PM Medical Record [id number] Patient Account Number: [id number] Date of Birth/Sex: Treating RN: August 04, 1935 (84 y.o. [name] Primary Care Kristiana Jacko: Hollace Kinnier Other Clinician: Referring Layni Kreamer: Treating Sheliah Fiorillo/Extender:Robson, Arlyss Queen, Lindell Spar in Treatment: 1 Visit Information History Since Last Visit Added or deleted any medications: No Patient Arrived: Wheel Chair Any new allergies or adverse reactions: No Arrival Time: 13:45 Had a fall or experienced change in No activities of daily living that may affect Accompanied By: self risk of falls: Transfer Assistance: None Signs or symptoms of abuse/neglect since last No Patient Identification Verified: Yes visito Secondary Verification Process Completed: Yes Hospitalized since last visit: No Patient Requires Transmission-Based No Implantable device outside of the clinic excluding No Precautions: cellular tissue based products placed in the center Patient Has Alerts: No since last visit: Has Dressing in Place as Prescribed: Yes Pain Present Now: Yes Electronic Signature(s) Signed: 12/04/2019 3:02:15 PM By: Sandre Kitty Entered By: Sandre Kitty on 10/08/2019 13:50:19 -------------------------------------------------------------------------------- Clinic Level of Care Assessment Details Patient Name: Date of Service: Monique Cabrera 10/08/2019 1:45 PM Medical Record [id number] Patient Account Number: [id number] Date of Birth/Sex: Treating RN: 1935/12/07 (84 y.o. [name] Primary Care Lavonne Cass: Hollace Kinnier Other Clinician: Referring Rosina Cressler: Treating Dewanda Fennema/Extender:Robson, Arlyss Queen, Lindell Spar in Treatment: 1 Clinic Level of Care Assessment Items TOOL 4 Quantity Score X - Use when only an EandM is  performed on FOLLOW-UP visit 1 0 ASSESSMENTS - Nursing Assessment / Reassessment X - Reassessment of Co-morbidities (includes updates in patient status) 1 10 X - Reassessment of Adherence to Treatment Plan 1 5 ASSESSMENTS - Wound and Skin Assessment / Reassessment X - Simple Wound Assessment / Reassessment - one wound 1 5 []  - Complex Wound Assessment / Reassessment - multiple wounds 0 []  - Dermatologic / Skin Assessment (not related to wound area) 0 ASSESSMENTS - Focused Assessment X - Circumferential Edema Measurements - multi extremities 1 5 []  - Nutritional Assessment / Counseling / Intervention 0 X - Lower Extremity Assessment (monofilament, tuning fork, pulses) 1 5 []  - Peripheral Arterial Disease Assessment (using hand held doppler) 0 ASSESSMENTS - Ostomy and/or Continence Assessment and Care []  - Incontinence Assessment and Management 0 []  - Ostomy Care Assessment and Management (repouching, etc.) 0 PROCESS - Coordination of Care X - Simple Patient / Family Education for ongoing care 1 15 []  - Complex (extensive) Patient / Family Education for ongoing care 0 []  - Staff obtains Programmer, systems, Records, Test Results / Process Orders 0 X - Staff telephones HHA, Nursing Homes / Clarify orders / etc 1 10 []  - Routine Transfer to another Facility (non-emergent condition) 0 []  - Routine Hospital Admission (non-emergent condition) 0 []  - New Admissions / Biomedical engineer / Ordering NPWT, Apligraf, etc. 0 []  - Emergency Hospital Admission (emergent condition) 0 []  - Simple Discharge Coordination 0 []  - Complex (extensive) Discharge Coordination 0 PROCESS - Special Needs []  - Pediatric / Minor Patient Management 0 []  - Isolation Patient Management 0 []  - Hearing / Language / Visual special needs 0 []  - Assessment of Community assistance (transportation, D/C planning, etc.) 0 []  - Additional assistance / Altered mentation 0 []  - Support Surface(s) Assessment (bed, cushion, seat, etc.)  0 INTERVENTIONS - Wound Cleansing / Measurement X - Simple Wound Cleansing - one wound 1 5 []  - Complex Wound Cleansing - multiple wounds 0 X - Wound Imaging (  photographs - any number of wounds) 1 5 []  - Wound Tracing (instead of photographs) 0 X - Simple Wound Measurement - one wound 1 5 []  - Complex Wound Measurement - multiple wounds 0 INTERVENTIONS - Wound Dressings X - Small Wound Dressing one or multiple wounds 1 10 []  - Medium Wound Dressing one or multiple wounds 0 []  - Large Wound Dressing one or multiple wounds 0 X - Application of Medications - topical 1 5 []  - Application of Medications - injection 0 INTERVENTIONS - Miscellaneous []  - External ear exam 0 []  - Specimen Collection (cultures, biopsies, blood, body fluids, etc.) 0 []  - Specimen(s) / Culture(s) sent or taken to Lab for analysis 0 []  - Patient Transfer (multiple staff / [employer] Service fast streamer / Similar devices) 0 []  - Simple Staple / Suture removal (25 or less) 0 []  - Complex Staple / Suture removal (26 or more) 0 []  - Hypo / Hyperglycemic Management (close monitor of Blood Glucose) 0 []  - Ankle / Brachial Index (ABI) - do not check if billed separately 0 X - Vital Signs 1 5 Has the patient been seen at the hospital within the last three years: Yes Total Score: 90 Level Of Care: New/Established - Level 3 Electronic Signature(s) Signed: 10/08/2019 5:18:03 PM By: Kela Millin Entered By: Kela Millin on 10/08/2019 14:39:06 -------------------------------------------------------------------------------- Encounter Discharge Information Details Patient Name: Date of Service: Monique Linea. 10/08/2019 1:45 PM Medical Record [id number] Patient Account Number: [id number] Date of Birth/Sex: Treating RN: [birth year] (84 y.o. Debby Bud Primary Care Laterrian Hevener: Hollace Kinnier Other Clinician: Referring Fernado Brigante: Treating Joan Avetisyan/Extender:Robson, Arlyss Queen, Lindell Spar in Treatment: 1 Encounter  Discharge Information Items Discharge Condition: Stable Ambulatory Status: Wheelchair Discharge Destination: Home Transportation: Private Auto Accompanied By: self Schedule Follow-up Appointment: Yes Clinical Summary of Care: Electronic Signature(s) Signed: 10/08/2019 6:01:15 PM By: Deon Pilling Entered By: Deon Pilling on 10/08/2019 15:00:23 -------------------------------------------------------------------------------- Lower Extremity Assessment Details Patient Name: Date of Service: SERINA, NICHTER 10/08/2019 1:45 PM Medical Record [id number] Patient Account Number: [id number] Date of Birth/Sex: Treating RN: 11-11-1935 (84 y.o. Orvan Falconer Primary Care Yareth Macdonnell: Hollace Kinnier Other Clinician: Referring Amijah Timothy: Treating Delana Manganello/Extender:Robson, Arlyss Queen, Tiffany Weeks in Treatment: 1 Edema Assessment Assessed: [Left: No] [Right: No] Edema: [Left: N] [Right: o] Calf Left: Right: Point of Measurement: 30 cm From Medial Instep cm 27 cm Ankle Left: Right: Point of Measurement: 8 cm From Medial Instep cm 18 cm Electronic Signature(s) Signed: 12/04/2019 3:01:15 PM By: Carlene Coria RN Entered By: Carlene Coria on 10/08/2019 14:15:51 -------------------------------------------------------------------------------- Multi Wound Chart Details Patient Name: Date of Service: Monique Linea. 10/08/2019 1:45 PM Medical Record [id number] Patient Account Number: [id number] Date of Birth/Sex: Treating RN: January 25, 1935 (84 y.o. [name] Primary Care Grantley Savage: Hollace Kinnier Other Clinician: Referring Eddison Searls: Treating Hermenia Fritcher/Extender:Robson, Arlyss Queen, Tiffany Weeks in Treatment: 1 Vital Signs Height(in): 65 Pulse(bpm): 90 Weight(lbs): 138 Blood Pressure(mmHg): 142/89 Body Mass Index(BMI): 23 Temperature(F): 99.1 Respiratory 18 Rate(breaths/min): Photos: [1:No Photos] [N/A:N/A] Wound Location: [1:Right Toe - Web between N/A 4th and  5th] Wounding Event: [1:Gradually Appeared] [N/A:N/A] Primary Etiology: [1:Diabetic Wound/Ulcer of the N/A Lower Extremity] Comorbid History: [1:Cataracts, Anemia, Hypertension, Peripheral Arterial Disease, Type II Diabetes, Neuropathy, Confinement Anxiety] [N/A:N/A] Date Acquired: [1:09/05/2019] [N/A:N/A] Weeks of Treatment: [1:1] [N/A:N/A] Wound Status: [1:Open] [N/A:N/A] Measurements L x W x D 0.4x0.3x2.3 [N/A:N/A] (cm) Area (cm) : [1:0.094] [N/A:N/A] Volume (cm) : [1:0.217] [N/A:N/A] % Reduction in Area: [1:0.00%] [N/A:N/A] % Reduction in Volume: 14.60% [N/A:N/A] Classification: [1:Grade 2] [N/A:N/A] Exudate Amount: [  1:Medium] [N/A:N/A] Exudate Type: [1:Serosanguineous] [N/A:N/A] Exudate Color: [1:red, brown] [N/A:N/A] Wound Margin: [1:Well defined, not attached N/A] Granulation Amount: [1:Large (67-100%)] [N/A:N/A] Granulation Quality: [1:Pink, Pale] [N/A:N/A] Necrotic Amount: [1:None Present (0%)] [N/A:N/A] Exposed Structures: [1:Fat Layer (Subcutaneous N/A Tissue) Exposed: Yes Fascia: No Tendon: No Muscle: No Joint: No Bone: No None] [N/A:N/A] Treatment Notes Electronic Signature(s) Signed: 10/08/2019 5:50:55 PM By: Linton Ham MD Signed: 10/09/2019 5:52:09 PM By: Levan Hurst RN, BSN Entered By: Linton Ham on 10/08/2019 14:42:40 -------------------------------------------------------------------------------- Multi-Disciplinary Care Plan Details Patient Name: Date of Service: Monique Linea. 10/08/2019 1:45 PM Medical Record [id number] Patient Account Number: [id number] Date of Birth/Sex: Treating RN: [birth year] (84 y.o. [name] Primary Care Dvid Pendry: Hollace Kinnier Other Clinician: Referring Hazael Olveda: Treating America Sandall/Extender:Robson, Arlyss Queen, Lindell Spar in Treatment: 1 Active Inactive Nutrition Nursing Diagnoses: Imbalanced nutrition Impaired glucose control: actual or potential Goals: Patient/caregiver agrees to and  verbalizes understanding of need to use nutritional supplements and/or vitamins as prescribed Date Initiated: 10/01/2019 Target Resolution Date: 11/02/2019 Goal Status: Active Patient/caregiver verbalizes understanding of need to maintain therapeutic glucose control per primary care physician Date Initiated: 10/01/2019 Target Resolution Date: 11/02/2019 Goal Status: Active Interventions: Assess HgA1c results as ordered upon admission and as needed Assess patient nutrition upon admission and as needed per policy Provide education on elevated blood sugars and impact on wound healing Provide education on nutrition Treatment Activities: Education provided on Nutrition : 10/01/2019 Notes: Wound/Skin Impairment Nursing Diagnoses: Impaired tissue integrity Knowledge deficit related to ulceration/compromised skin integrity Goals: Patient/caregiver will verbalize understanding of skin care regimen Date Initiated: 10/01/2019 Target Resolution Date: 11/02/2019 Goal Status: Active Ulcer/skin breakdown will have a volume reduction of 30% by week 4 Date Initiated: 10/01/2019 Target Resolution Date: 11/02/2019 Goal Status: Active Interventions: Assess patient/caregiver ability to obtain necessary supplies Assess patient/caregiver ability to perform ulcer/skin care regimen upon admission and as needed Assess ulceration(s) every visit Provide education on ulcer and skin care Notes: Electronic Signature(s) Signed: 10/08/2019 5:18:03 PM By: Kela Millin Entered By: Kela Millin on 10/08/2019 14:27:29 -------------------------------------------------------------------------------- Pain Assessment Details Patient Name: Date of Service: Monique Linea 10/08/2019 1:45 PM Medical Record [id number] Patient Account Number: [id number] Date of Birth/Sex: Treating RN: 03-Mar-1935 (84 y.o. [name] Primary Care Erricka Falkner: Hollace Kinnier Other Clinician: Referring Atwell Mcdanel: Treating  Zaiah Eckerson/Extender:Robson, Arlyss Queen, Tiffany Weeks in Treatment: 1 Active Problems Location of Pain Severity and Description of Pain Patient Has Paino Yes Site Locations Rate the pain. Current Pain Level: 3 Pain Management and Medication Current Pain Management: Electronic Signature(s) Signed: 10/09/2019 5:52:09 PM By: Levan Hurst RN, BSN Signed: 12/04/2019 3:02:15 PM By: Sandre Kitty Entered By: Sandre Kitty on 10/08/2019 13:52:57 -------------------------------------------------------------------------------- Patient/Caregiver Education Details Patient Name: Date of Service: Monique Linea 10/12/2020andnbsp1:45 PM Medical Record [id number] Patient Account Number: [id number] Date of Birth/Gender: Treating RN: 08-May-1935 (84 y.o. [name] Primary Care Physician: Hollace Kinnier Other Clinician: Referring Physician: Treating Physician/Extender:Robson, Arlyss Queen, Lindell Spar in Treatment: 1 Education Assessment Education Provided To: Patient Education Topics Provided Elevated Blood Sugar/ Impact on Healing: Methods: Explain/Verbal Responses: State content correctly Nutrition: Methods: Explain/Verbal Responses: State content correctly Wound/Skin Impairment: Methods: Explain/Verbal Responses: State content correctly Electronic Signature(s) Signed: 10/08/2019 5:18:03 PM By: Kela Millin Entered By: Kela Millin on 10/08/2019 14:28:07 -------------------------------------------------------------------------------- Wound Assessment Details Patient Name: Date of Service: Monique Linea 10/08/2019 1:45 PM Medical Record [id number] Patient Account Number: [id number] Date of Birth/Sex: Treating RN: [birth year] (84 y.o. [name] Primary Care Malyn Aytes: Hollace Kinnier Other Clinician: Referring Amaiya Scruton:  Treating Sundeep Destin/Extender:Robson, Arlyss Queen, Tiffany Weeks in Treatment: 1 Wound Status Wound Number: 1  Primary Diabetic Wound/Ulcer of the Lower Extremity Etiology: Wound Location: Right Toe - Web between 4th and 5th Wound Open Status: Wounding Event: Gradually Appeared Comorbid Cataracts, Anemia, Hypertension, Peripheral Date Acquired: 09/05/2019 History: Arterial Disease, Type II Diabetes, Neuropathy, Weeks Of Treatment: 1 Confinement Anxiety Clustered Wound: No Photos Wound Measurements Length: (cm) 0.4 Width: (cm) 0.3 Depth: (cm) 2.3 Area: (cm) 0.094 Volume: (cm) 0.217 Wound Description Classification: Grade 2 Wound Margin: Well defined, not attached Exudate Amount: Medium Exudate Type: Serosanguineous Exudate Color: red, brown Wound Bed Granulation Amount: Large (67-100%) Granulation Quality: Pink, Pale Necrotic Amount: None Present (0%) After Cleansing: No brino No Exposed Structure osed: No (Subcutaneous Tissue) Exposed: Yes osed: No osed: No sed: No ed: No % Reduction in Area: 0% % Reduction in Volume: 14.6% Epithelialization: None Tunneling: No Undermining: No Foul Odor Slough/Fi Fascia Exp Fat Layer Tendon Exp Muscle Exp Joint Expo Bone Expos Electronic Signature(s) Signed: 10/09/2019 5:52:09 PM By: Levan Hurst RN, BSN Signed: 10/31/2019 4:01:42 PM By: Mikeal Hawthorne EMT/HBOT Entered By: Mikeal Hawthorne on 10/09/2019 11:07:46 -------------------------------------------------------------------------------- Vitals Details Patient Name: Date of Service: Monique Linea. 10/08/2019 1:45 PM Medical Record [id number] Patient Account Number: [id number] Date of Birth/Sex: Treating RN: 1935/02/08 (84 y.o. [name] Primary Care Lorne Winkels: Hollace Kinnier Other Clinician: Referring Elan Mcelvain: Treating Tamico Mundo/Extender:Robson, Arlyss Queen, Tiffany Weeks in Treatment: 1 Vital Signs Time Taken: 13:50 Temperature (F): 99.1 Height (in): 65 Pulse (bpm): 90 Weight (lbs): 138 Respiratory Rate (breaths/min): 18 Body Mass Index (BMI):  23 Blood Pressure (mmHg): 142/89 Reference Range: 80 - 120 mg / dl Electronic Signature(s) Signed: 12/04/2019 3:02:15 PM By: Sandre Kitty Entered By: Sandre Kitty on 10/08/2019 13:52:46

## 2019-12-04 NOTE — Progress Notes (Signed)
   Patient name: Monique Cabrera MRN: [id number] DOB: 27-Mar-1935 Sex: female  REASON FOR VISIT: Follow-up right below-knee amputation from 11/02/2019  HPI: Monique Cabrera is a 84 y.o. female here today for follow-up.  She had presented with unsalvageable gangrene to her right foot.  She underwent right below-knee amputation and has had no difficulty following this.  She looks quite good today.  She reports that she is independent from bed to wheelchair.  She has no pain associated with her amputation  Current Outpatient Medications  Medication Sig Dispense Refill  . amLODipine (NORVASC) 10 MG tablet TAKE 1 TABLET (10 MG TOTAL) BY MOUTH DAILY. FOR HIGH BLOOD PRESSURE 90 tablet 1  . aspirin 81 MG chewable tablet Chew 1 tablet (81 mg total) by mouth daily. 90 tablet 3  . atorvastatin (LIPITOR) 20 MG tablet TAKE 1 TABLET BY MOUTH EVERY DAY 90 tablet 1  . carvedilol (COREG) 25 MG tablet TAKE 1 TABLET BY MOUTH TWICE A DAY WITH A MEAL 180 tablet 1  . clopidogrel (PLAVIX) 75 MG tablet TAKE 1 TABLET BY MOUTH EVERY DAY 90 tablet 1  . glucose 4 GM chewable tablet Chew 1 tablet (4 g total) by mouth as needed for low blood sugar. 50 tablet 12  . HYDROcodone-acetaminophen (NORCO/VICODIN) 5-325 MG tablet Take 1-2 tablets by mouth every 4 (four) hours as needed for moderate pain. 15 tablet 0  . insulin glargine (LANTUS) 100 UNIT/ML injection Inject 0.28 mLs (28 Units total) into the skin daily. 10 mL 11  . insulin lispro (HUMALOG KWIKPEN) 100 UNIT/ML KwikPen Inject 0.03 mLs (3 Units total) into the skin 3 (three) times daily. 6 pen 5  . Insulin Pen Needle (B-D ULTRAFINE III SHORT PEN) 31G X 8 MM MISC Use to check blood sugar every day. Dx: 11.29; 11.65 100 each 1  . ONETOUCH VERIO test strip USE TO TEST BLOOD SUGAR THREE TIMES DAILY. DX: E11.9 300 strip 3   No current facility-administered medications for this visit.      PHYSICAL EXAM: Vitals:   12/04/19 1025  BP:  137/71  Pulse: 74  Resp: 20  Temp: (!) 97.3 F (36.3 C)  SpO2: 98%  Weight: 133 lb (60.3 kg)  Height: 5\' 5"  (1.651 m)    GENERAL: The patient is a well-nourished female, in no acute distress. The vital signs are documented above. Her below-knee amputation is completely healed.  She will have staples removed today.  MEDICAL ISSUES: Stable status post right below-knee amputation for gangrene of her right foot.  She presented with extensive gangrenous changes and a nonsalvageable foot.  We will refer her to [employer] for stump shrinker and hopefully eventual prosthetic fitting.  She will see [place] again on an as-needed basis   Rosetta Posner, MD [facility] Tel [phone] Pager [phone]

## 2019-12-04 NOTE — Progress Notes (Signed)
EVONA, WESTRA ([id number]) Visit Report for 10/23/2019 HPI Details Patient Name: Date of Service: Monique Cabrera, Monique Cabrera 10/23/2019 2:00 PM Medical Record [id number] Patient Account Number: [id number] Date of Birth/Sex: Treating RN: 05-04-1935 (84 y.o. Monique Cabrera Primary Care Provider: Hollace Kinnier Other Clinician: Referring Provider: Treating Provider/Extender:, Arlyss Queen, Lindell Spar in Treatment: 3 History of Present Illness HPI Description: ADMISSION 10/01/2019 This is an 84 year old woman with type 2 diabetes on insulin. Last hemoglobin A1c 9.5 in July. Her problem started out initially in late August she felt a popping sound she was seen in primary care at [facility] and felt to have gout in the left first metatarsal phalangeal. She was referred to podiatry and found a fracture that they found a fracture of the first metatarsal. They also noted a ulcer in the right fourth webspace roughly 2 to 4 weeks ago. She was seen in the ER with pain and swelling in the right foot. An x-ray showed no osseous deformity. She was put on Keflex.. During her last appointment with podiatry she was given Iodosorb and his mother placed on the wound. She lives alone and finds this difficult to do. She has just completed a course of Augmentin and doxycycline. This was apparently given to her in the ER on 9/15. The patient also tells me that she has a history of PAD. In fact several years ago was told that she needed a stent in the left leg but she never followed up with this. I am also not clear who she saw. She clearly has claudication with pain in the right ankle when she walks from her apartment to the mailbox in her building forcing her to stop and rest. ABIs in our clinic were at the posterior tibial on the right at 0.78. Past medical history; type 2 diabetes on insulin recent hemoglobin A1c of 9.5, PAD, retinopathy, hyperlipidemia, chronic kidney disease stage II,  anemia, postherpetic neuralgia 10/12; patient complains of more pain in the right leg that is keeping her up at night. She is not complaining of pain in the left foot however.foot is in a fracture boot. Culture I did of the wound between her fourth and fifth toes last week showed Serratia and a organism I am not familiar with cold and Pentoea. I will start her on ciprofloxacin today. She was in the ER over the weekend and x-ray of the foot did not show osteomyelitis 10/19; arrives with a dark black eschar over the distal foot at the level of the fourth toe. This is in addition to the painful wound area between the fourth and fifth toes. She is completing her antibiotics. She has noninvasive arterial studies with Dr. Kennon Holter office tomorrow. We are using silver alginate 10/27; the patient has continued dry gangrene over the right foot. This is dorsally but certainly worse than 8 days ago. The wound between her fourth and fifth toes is a tunneling wound. She saw Dr. Fletcher Anon of interventional radiology yesterday and she is booked for an angiogram next week. This is on 11/4 Her noninvasive studies were not very good. On the right the posterior tibial artery and peroneal artery were occluded monophasic waveforms elsewhere. Also likely total occlusion noted in the superficial femoral artery I reviewed Dr. Cyndi Lennert note from 10/27. He felt he she had peripheral artery disease with critical limb ischemia and tissue loss. He felt she is at high risk for limb loss. This is to be done on 11/4. I have also discussed the situation  with regards to pain control with the patient's primary physician Dr. Mariea Clonts. She will send her in some [place] Signature(s) Signed: 10/23/2019 6:05:35 PM By: Linton Ham MD Entered By: Linton Ham on 10/23/2019 17:54:18 -------------------------------------------------------------------------------- Physical Exam Details Patient Name: Date of  Service: Monique Cabrera 10/23/2019 2:00 PM Medical Record [id number] Patient Account Number: [id number] Date of Birth/Sex: Treating RN: 1935-08-19 (84 y.o. Monique Cabrera Primary Care Provider: Hollace Kinnier Other Clinician: Referring Provider: Treating Provider/Extender:, Arlyss Queen, Lindell Spar in Treatment: 3 Constitutional Patient is hypertensive.. Pulse regular and within target range for patient.Marland Kitchen Respirations regular, non-labored and within target range.. Temperature is normal and within the target range for the patient.Marland Kitchen Appears in no distress. Eyes Conjunctivae clear. No discharge.no icterus. Respiratory work of breathing is normal. Cardiovascular Popliteal pulses not palpable on the right. None palpable on the right. Integumentary (Hair, Skin) Progressive dry gangrene on the dorsal foot with some threatened tissue as well. Psychiatric appears at normal baseline. Notes Wound exam; wide open probing hole between her fourth and fifth toes. It is far too painful to meaningfully examine this area but this is a deep tunnel with necrotic tissue. She is completing a course of doxycycline although I do not really see any overt infection. Electronic Signature(s) Signed: 10/23/2019 6:05:35 PM By: Linton Ham MD Entered By: Linton Ham on 10/23/2019 17:55:35 -------------------------------------------------------------------------------- Physician Orders Details Patient Name: Date of Service: Monique Cabrera 10/23/2019 2:00 PM Medical Record [id number] Patient Account Number: [id number] Date of Birth/Sex: Treating RN: [birth year] (84 y.o. Monique Cabrera Primary Care Provider: Hollace Kinnier Other Clinician: Referring Provider: Treating Provider/Extender:, Arlyss Queen, Lindell Spar in Treatment: 3 Verbal / Phone Orders: No Diagnosis Coding ICD-10 Coding Code Description E11.621 Type 2 diabetes mellitus with foot ulcer E11.51 Type  2 diabetes mellitus with diabetic peripheral angiopathy without gangrene L97.518 Non-pressure chronic ulcer of other part of right foot with other specified severity Follow-up Appointments Return Appointment in 1 week. Dressing Change Frequency Wound #1 Right Toe - Web between 4th and 5th Change Dressing every other day. Wound #2 Right,Dorsal Foot Change Dressing every other day. Wound Cleansing Wound #1 Right Toe - Web between 4th and 5th Clean wound with Wound Cleanser - or normal saline Wound #2 Right,Dorsal Foot Clean wound with Wound Cleanser - or normal saline Primary Wound Dressing Wound #1 Right Toe - Web between 4th and 5th Calcium Alginate with Silver - lightly pack Wound #2 Right,Dorsal Foot Calcium Alginate with Silver Secondary Dressing Wound #1 Right Toe - Web between 4th and 5th Dry Gauze - secure with tape Wound #2 Right,Dorsal Foot Kerlix/Rolled Gauze Dry [place] skilled nursing for wound care. - Advanced Patient Medications Allergies: Invokana, Jardiance Notifications Medication Indication Start End tramadol 10/23/2019 DOSE 1 - oral 50 mg tablet - 1 tablet oral, every 6 hours as needed for pain Electronic Signature(s) Signed: 10/23/2019 6:05:35 PM By: Linton Ham MD Signed: 12/04/2019 3:01:15 PM By: Carlene Coria RN Entered By: Carlene Coria on 10/23/2019 17:02:00 -------------------------------------------------------------------------------- Prescription 10/23/2019 Patient Name: Monique Cabrera Provider: Linton Ham MD Date of Birth: Oct 27, 1935 NPI#: [id number] Sex: F DEA#: [id number] Phone #: [phone] License #: [id number] Patient Address: [facility] [place] [address] [place] [address] [place], [place] [address] [place], [place] [address] [phone] Allergies Invokana Reaction: vaginal itching and swelling Severity: Moderate Jardiance Reaction:  vaginal itching and swelling Severity: Moderate Medication Note: This prescription was automatically  generated because the medication is a controlled substance, and cannot be prescribed electronically. Medication: Route: Strength: Form: tramadol oral 50 mg tablet Class: OPIOID ANALGESICS Dose: Frequency / Time: Indication: 1 1 tablet oral, every 6 hours as needed for pain Number of Refills: Number of Units: 0 Thirty (30) Generic Substitution: Start Date: End Date: Administered at Substitution Permitted 68/11/7516 Facility: No Note to Pharmacy: Signature(s): Date(s): Electronic Signature(s) Signed: 10/23/2019 6:05:35 PM By: Linton Ham MD Signed: 12/04/2019 3:01:15 PM By: Carlene Coria RN Entered By: Carlene Coria on 10/23/2019 17:02:00 --------------------------------------------------------------------------------  Problem List Details Patient Name: Date of Service: Monique Cabrera. 10/23/2019 2:00 PM Medical Record [id number] Patient Account Number: [id number] Date of Birth/Sex: Treating RN: 1935-12-25 (84 y.o. 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 Monique Cabrera Primary Care Provider: Hollace Kinnier Other Clinician: Referring Provider: Treating Provider/Extender:, Arlyss Queen, Lindell Spar in Treatment: 3 Subjective History of Present Illness (HPI) ADMISSION 10/01/2019 This is an 84 year old woman with type 2 diabetes on insulin. Last hemoglobin A1c 9.5 in July. Her problem started out initially in late August she felt a popping sound she was seen in primary care at [facility] and felt to have gout in the left first metatarsal phalangeal. She was referred to podiatry and found a fracture that they found a fracture of the first metatarsal. They also noted a ulcer in the right fourth webspace roughly 2 to 4 weeks ago. She was seen in the ER with pain and swelling in the right foot. An x-ray showed no osseous deformity. She was put on Keflex.. During her last appointment with podiatry she was given Iodosorb and his mother placed on the wound. She lives alone and finds this difficult to do. She has just completed a course of Augmentin and doxycycline. This was apparently given to her in the ER on 9/15. The patient also tells me that she has a history of PAD. In fact several years ago was told that she needed a stent in the left leg but she never followed up with this. I am also not clear who she saw. She clearly has claudication with pain in the right ankle when she walks from her apartment to the mailbox in her building forcing her to stop and rest. ABIs in our clinic were at the posterior tibial on the right at 0.78. Past medical history; type 2 diabetes on insulin recent hemoglobin A1c of 9.5, PAD, retinopathy, hyperlipidemia, chronic kidney disease stage II, anemia, postherpetic neuralgia 10/12; patient complains of more pain in the right leg that is keeping her up at night. She is  not complaining of pain in the left foot however.foot is in a fracture boot. Culture I did of the wound between her fourth and fifth toes last week showed Serratia and a organism I am  not familiar with cold and Pentoea. I will start her on ciprofloxacin today. She was in the ER over the weekend and x-ray of the foot did not show osteomyelitis 10/19; arrives with a dark black eschar over the distal foot at the level of the fourth toe. This is in addition to the painful wound area between the fourth and fifth toes. She is completing her antibiotics. She has noninvasive arterial studies with Dr. Kennon Holter office tomorrow. We are using silver alginate 10/27; the patient has continued dry gangrene over the right foot. This is dorsally but certainly worse than 8 days ago. The wound between her fourth and fifth toes is a tunneling wound. She saw Dr. Fletcher Anon of interventional radiology yesterday and she is booked for an angiogram next week. This is on 11/4 Her noninvasive studies were not very good. On the right the posterior tibial artery and peroneal artery were occluded monophasic waveforms elsewhere. Also likely total occlusion noted in the superficial femoral artery I reviewed Dr. Cyndi Lennert note from 10/27. He felt he she had peripheral artery disease with critical limb ischemia and tissue loss. He felt she is at high risk for limb loss. This is to be done on 11/4. I have also discussed the situation with regards to pain control with the patient's primary physician Dr. Mariea Clonts. She will send her in some [place] tomorrow Objective Constitutional Patient is hypertensive.. Pulse regular and within target range for patient.Marland Kitchen Respirations regular, non-labored and within target range.. Temperature is normal and within the target range for the patient.Marland Kitchen Appears in no distress. Vitals Time Taken: 2:49 PM, Height: 65 in, Weight: 138 lbs, BMI: 23, Temperature: 98.8 F, Pulse: 102 bpm, Respiratory Rate: 18 breaths/min,  Blood Pressure: 160/66 mmHg. Eyes Conjunctivae clear. No discharge.no icterus. Respiratory work of breathing is normal. Cardiovascular Popliteal pulses not palpable on the right. None palpable on the right. Psychiatric appears at normal baseline. General Notes: Wound exam; wide open probing hole between her fourth and fifth toes. It is far too painful to meaningfully examine this area but this is a deep tunnel with necrotic tissue. She is completing a course of doxycycline although I do not really see any overt infection. Integumentary (Hair, Skin) Progressive dry gangrene on the dorsal foot with some threatened tissue as well. Wound #1 status is Open. Original cause of wound was Gradually Appeared. The wound is located on the Right Toe - Web between 4th and 5th. The wound measures 1.4cm length x 0.4cm width x 2cm depth; 0.44cm^2 area and 0.88cm^3 volume. There is Fat Layer (Subcutaneous Tissue) Exposed exposed. There is no tunneling or undermining noted. There is a medium amount of serosanguineous drainage noted. The wound margin is well defined and not attached to the wound base. There is large (67-100%) pink, pale granulation within the wound bed. There is no necrotic tissue within the wound bed. Wound #2 status is Open. Original cause of wound was Gradually Appeared. The wound is located on the Right,Dorsal Foot. The wound measures 5.7cm length x 3cm width x 0.1cm depth; 13.43cm^2 area and 1.343cm^3 volume. There is no tunneling or undermining noted. There is a none present amount of drainage noted. The wound margin is flat and intact. There is no granulation within the wound bed. There is a large (67-100%) amount of necrotic tissue within the wound bed including Eschar. Assessment Active Problems ICD-10 Type 2 diabetes mellitus with foot ulcer Type 2 diabetes mellitus with diabetic peripheral angiopathy without gangrene Non-pressure chronic ulcer of other part  of right foot with  other specified severity Plan Follow-up Appointments: Return Appointment in 1 week. Dressing Change Frequency: Wound #1 Right Toe - Web between 4th and 5th: Change Dressing every other day. Wound #2 Right,Dorsal Foot: Change Dressing every other day. Wound Cleansing: Wound #1 Right Toe - Web between 4th and 5th: Clean wound with Wound Cleanser - or normal saline Wound #2 Right,Dorsal Foot: Clean wound with Wound Cleanser - or normal saline Primary Wound Dressing: Wound #1 Right Toe - Web between 4th and 5th: Calcium Alginate with Silver - lightly pack Wound #2 Right,Dorsal Foot: Calcium Alginate with Silver Secondary Dressing: Wound #1 Right Toe - Web between 4th and 5th: Dry Gauze - secure with tape Wound #2 Right,Dorsal Foot: Kerlix/Rolled Gauze Dry Gauze Home Health: [facility] skilled nursing for wound care. - Advanced The following medication(s) was prescribed: tramadol oral 50 mg tablet 1 1 tablet oral, every 6 hours as needed for pain starting 10/23/2019 1. Continue silver alginate and Betadine to the area on the dorsal foot 2. I spoke with your primary care doctor about pain control 3. I am a little bit concerned about the delay in angiogram but apparently that is as early as it can be done. There has been progression of the tissue ischemia since I last saw her 8 days ago. I do not see any evidence of infection 4. Patient is obviously at risk of limb loss. I did talk to her about this today although she is still hopeful that Dr. Cicero Duck can improve blood flow to this area of her foot. She has an occluded right SFA. She has one-vessel runoff below the knee. The anterior tibial artery. She also has severe disc disease on the left but she does not have an open wound here Electronic Signature(s) Signed: 10/23/2019 6:05:35 PM By: Linton Ham MD Entered By: Linton Ham on 10/23/2019  17:58:08 -------------------------------------------------------------------------------- SuperBill Details Patient Name: Date of Service: Monique Cabrera 10/23/2019 Medical Record [id number] Patient Account Number: [id number] Date of Birth/Sex: Treating RN: 11/23/1935 (84 y.o. Monique Cabrera Primary Care Provider: Hollace Kinnier Other Clinician: Referring Provider: Treating Provider/Extender:, Arlyss Queen, Lindell Spar in Treatment: 3 Diagnosis Coding ICD-10 Codes Code Description E11.621 Type 2 diabetes mellitus with foot ulcer E11.51 Type 2 diabetes mellitus with diabetic peripheral angiopathy without gangrene L97.518 Non-pressure chronic ulcer of other part of right foot with other specified severity Facility Procedures CPT4 Code: [id number] Description: 99213 - WOUND CARE VISIT-LEV 3 EST PT Modifier: Quantity: 1 Physician Procedures CPT4 Code Description: [id number] 99214 - WC PHYS LEVEL 4 - EST PT ICD-10 Diagnosis Description E11.621 Type 2 diabetes mellitus with foot ulcer E11.51 Type 2 diabetes mellitus with diabetic peripheral angiop L97.518 Non-pressure chronic ulcer of other part  of right foot w Modifier: athy without gang ith other specifi Quantity: 1 rene ed severity Electronic Signature(s) Signed: 10/23/2019 6:05:35 PM By: Linton Ham MD Entered By: Linton Ham on 10/23/2019 17:58:29

## 2019-12-05 NOTE — Progress Notes (Signed)
Monique Cabrera, Monique Cabrera ([id number]) Visit Report for 10/15/2019 Arrival Information Details Patient Name: Date of Service: Monique Cabrera, Monique Cabrera 10/15/2019 10:15 AM Medical Record [id number] Patient Account Number: [id number] Date of Birth/Sex: Treating RN: 31-Jan-1935 (84 y.o. Monique Cabrera Primary Care Monique Cabrera: Monique Cabrera Other Clinician: Referring Monique Cabrera: Treating Monique Cabrera/Extender:Monique Cabrera, Monique Cabrera in Treatment: 2 Visit Information History Since Last Visit Added or deleted any medications: No Patient Arrived: Wheel Chair Any new allergies or adverse reactions: No Arrival Time: 11:07 Had a fall or experienced change in No activities of daily living that may affect Accompanied By: self risk of falls: Transfer Assistance: None Signs or symptoms of abuse/neglect since last No Patient Identification Verified: Yes visito Secondary Verification Process Completed: Yes Hospitalized since last visit: No Patient Requires Transmission-Based No Implantable device outside of the clinic excluding No Precautions: cellular tissue based products placed in the center Patient Has Alerts: No since last visit: Has Dressing in Place as Prescribed: Yes Pain Present Now: Yes Electronic Signature(s) Signed: 12/04/2019 3:02:15 PM By: Monique Cabrera Entered By: Monique Cabrera on 10/15/2019 11:09:15 -------------------------------------------------------------------------------- Clinic Level of Care Assessment Details Patient Name: Date of Service: Monique, Cabrera 10/15/2019 10:15 AM Medical Record [id number] Patient Account Number: [id number] Date of Birth/Sex: Treating RN: [birth year] (84 y.o. Monique Cabrera Primary Care Monique Cabrera: Monique Cabrera Other Clinician: Referring Monique Cabrera: Treating Monique Cabrera/Extender:Monique Cabrera, Monique Cabrera in Treatment: 2 Clinic Level of Care Assessment Items TOOL 4 Quantity Score X - Use when only an EandM is  performed on FOLLOW-UP visit 1 0 ASSESSMENTS - Nursing Assessment / Reassessment X - Reassessment of Co-morbidities (includes updates in patient status) 1 10 X - Reassessment of Adherence to Treatment Plan 1 5 ASSESSMENTS - Wound and Skin Assessment / Reassessment []  - Simple Wound Assessment / Reassessment - one wound 0 X - Complex Wound Assessment / Reassessment - multiple wounds 2 5 []  - Dermatologic / Skin Assessment (not related to wound area) 0 ASSESSMENTS - Focused Assessment []  - Circumferential Edema Measurements - multi extremities 0 []  - Nutritional Assessment / Counseling / Intervention 0 X - Lower Extremity Assessment (monofilament, tuning fork, pulses) 1 5 []  - Peripheral Arterial Disease Assessment (using hand held doppler) 0 ASSESSMENTS - Ostomy and/or Continence Assessment and Care []  - Incontinence Assessment and Management 0 []  - Ostomy Care Assessment and Management (repouching, etc.) 0 PROCESS - Coordination of Care X - Simple Patient / Family Education for ongoing care 1 15 []  - Complex (extensive) Patient / Family Education for ongoing care 0 X - Staff obtains Programmer, systems, Records, Test Results / Process Orders 1 10 X - Staff telephones HHA, Nursing Homes / Clarify orders / etc 1 10 []  - Routine Transfer to another Facility (non-emergent condition) 0 []  - Routine Hospital Admission (non-emergent condition) 0 []  - New Admissions / Biomedical engineer / Ordering NPWT, Apligraf, etc. 0 []  - Emergency Hospital Admission (emergent condition) 0 X - Simple Discharge Coordination 1 10 []  - Complex (extensive) Discharge Coordination 0 PROCESS - Special Needs []  - Pediatric / Minor Patient Management 0 []  - Isolation Patient Management 0 []  - Hearing / Language / Visual special needs 0 []  - Assessment of Community assistance (transportation, D/C planning, etc.) 0 []  - Additional assistance / Altered mentation 0 []  - Support Surface(s) Assessment (bed, cushion, seat,  etc.) 0 INTERVENTIONS - Wound Cleansing / Measurement []  - Simple Wound Cleansing - one wound 0 X - Complex Wound Cleansing - multiple wounds 2 5 X - Wound  Imaging (photographs - any number of wounds) 1 5 []  - Wound Tracing (instead of photographs) 0 []  - Simple Wound Measurement - one wound 0 X - Complex Wound Measurement - multiple wounds 2 5 INTERVENTIONS - Wound Dressings []  - Small Wound Dressing one or multiple wounds 0 X - Medium Wound Dressing one or multiple wounds 1 15 []  - Large Wound Dressing one or multiple wounds 0 X - Application of Medications - topical 1 5 []  - Application of Medications - injection 0 INTERVENTIONS - Miscellaneous []  - External ear exam 0 []  - Specimen Collection (cultures, biopsies, blood, body fluids, etc.) 0 []  - Specimen(s) / Culture(s) sent or taken to Lab for analysis 0 []  - Patient Transfer (multiple staff / Civil Service fast streamer / Similar devices) 0 []  - Simple Staple / Suture removal (25 or less) 0 []  - Complex Staple / Suture removal (26 or more) 0 []  - Hypo / Hyperglycemic Management (close monitor of Blood Glucose) 0 []  - Ankle / Brachial Index (ABI) - do not check if billed separately 0 X - Vital Signs 1 5 Has the patient been seen at the hospital within the last three years: Yes Total Score: 125 Level Of Care: New/Established - Level 4 Electronic Signature(s) Signed: 10/17/2019 6:50:57 PM By: Monique Hurst RN, BSN Entered By: Monique Cabrera on 10/15/2019 11:53:05 -------------------------------------------------------------------------------- Encounter Discharge Information Details Patient Name: Date of Service: Monique Cabrera 10/15/2019 10:15 AM Medical Record [id number] Patient Account Number: [id number] Date of Birth/Sex: Treating RN: May 28, 1935 (84 y.o. Monique Cabrera Primary Care Monique Cabrera: Monique Cabrera Other Clinician: Referring Monique Cabrera: Treating Ailani Governale/Extender:Monique Cabrera, Monique Cabrera in Treatment:  2 Encounter Discharge Information Items Discharge Condition: Stable Ambulatory Status: Wheelchair Discharge Destination: Home Transportation: Other Accompanied By: self Schedule Follow-up Appointment: Yes Clinical Summary of Care: Patient Declined Electronic Signature(s) Signed: 10/16/2019 6:16:57 PM By: Kela Millin Entered By: Kela Millin on 10/15/2019 12:10:08 -------------------------------------------------------------------------------- Lower Extremity Assessment Details Patient Name: Date of Service: Monique Cabrera, Monique Cabrera 10/15/2019 10:15 AM Medical Record [id number] Patient Account Number: [id number] Date of Birth/Sex: Treating RN: 07-Mar-1935 (84 y.o. Monique Cabrera Primary Care Cheris Tweten: Monique Cabrera Other Clinician: Referring Aeon Kessner: Treating Anthonette Lesage/Extender:Monique Cabrera, Tiffany Weeks in Treatment: 2 Edema Assessment Assessed: [Left: No] [Right: No] Edema: [Left: N] [Right: o] Calf Left: Right: Point of Measurement: 30 cm From Medial Instep cm 27 cm Ankle Left: Right: Point of Measurement: 8 cm From Medial Instep cm 18 cm Vascular Assessment Pulses: Dorsalis Pedis Palpable: [Right:No] Electronic Signature(s) Signed: 10/17/2019 6:50:57 PM By: Monique Hurst RN, BSN Entered By: Monique Cabrera on 10/15/2019 11:48:09 -------------------------------------------------------------------------------- Multi Wound Chart Details Patient Name: Date of Service: Monique Cabrera 10/15/2019 10:15 AM Medical Record [id number] Patient Account Number: [id number] Date of Birth/Sex: Treating RN: 02/24/1935 (84 y.o. Monique Cabrera Primary Care Ailany Koren: Monique Cabrera Other Clinician: Referring Crytal Pensinger: Treating Glorya Bartley/Extender:Monique Cabrera, Tiffany Weeks in Treatment: 2 Vital Signs Height(in): 56 Pulse(bpm): 37 Weight(lbs): 138 Blood Pressure(mmHg): 147/60 Body Mass Index(BMI): 23 Temperature(F): 98.7 Respiratory  18 Rate(breaths/min): Photos: [1:No Photos] [2:No Photos] [N/A:N/A] Wound Location: [1:Right Toe - Web between Right Foot - Dorsal 4th and 5th] [N/A:N/A] Wounding Event: [1:Gradually Appeared] [2:Gradually Appeared] [N/A:N/A] Primary Etiology: [1:Diabetic Wound/Ulcer of the Diabetic Wound/Ulcer of the N/A Lower Extremity] [2:Lower Extremity] Comorbid History: [1:Cataracts, Anemia, Hypertension, Peripheral Hypertension, Peripheral Arterial Disease, Type II Diabetes, Neuropathy, Confinement Anxiety] [2:Cataracts, Anemia, Arterial Disease, Type II Diabetes, Neuropathy, Confinement Anxiety]  [N/A:N/A] Date Acquired: [1:09/05/2019] [2:10/15/2019] [N/A:N/A] Weeks of Treatment: [1:2] [2:0] [N/A:N/A]  Wound Status: [1:Open] [2:Open] [N/A:N/A] Measurements L x W x D 1.2x0.5x1.7 [2:3.5x1.7x0.1] [N/A:N/A] (cm) Area (cm) : [1:0.471] [2:4.673] [N/A:N/A] Volume (cm) : [1:0.801] [2:0.467] [N/A:N/A] % Reduction in Area: [1:-401.10%] [2:0.00%] [N/A:N/A] % Reduction in Volume: -215.40% [2:0.00%] [N/A:N/A] Classification: [1:Grade 2] [2:Unable to visualize wound N/A bed] Exudate Amount: [1:Medium] [2:Small] [N/A:N/A] Exudate Type: [1:Serosanguineous] [2:Serosanguineous] [N/A:N/A] Exudate Color: [1:red, brown] [2:red, brown] [N/A:N/A] Wound Margin: [1:Well defined, not attached Flat and Intact] [N/A:N/A] Granulation Amount: [1:Large (67-100%)] [2:None Present (0%)] [N/A:N/A] Granulation Quality: [1:Pink, Pale] [2:N/A] [N/A:N/A] Necrotic Amount: [1:None Present (0%)] [2:Large (67-100%)] [N/A:N/A] Necrotic Tissue: [1:N/A] [2:Eschar] [N/A:N/A] Exposed Structures: [1:Fat Layer (Subcutaneous Tissue) Exposed: Yes Fascia: No Tendon: No Muscle: No Joint: No Bone: No None] [2:Fascia: No Fat Layer (Subcutaneous Tissue) Exposed: No Tendon: No Muscle: No Joint: No Bone: No None] [N/A:N/A N/A] Treatment Notes Wound #1 (Right Toe - Web between 4th and 5th) 1. Cleanse With Wound Cleanser 2. Periwound Care Skin Prep 3.  Primary Dressing Applied Calcium Alginate Ag 4. Secondary Dressing Dry Gauze Roll Gauze 5. Secured With Tape Wound #2 (Right, Dorsal Foot) 1. Cleanse With Wound Cleanser 2. Periwound Care Skin Prep 3. Primary Dressing Applied Calcium Alginate Ag 4. Secondary Dressing Dry Gauze Roll Gauze 5. Secured With Recruitment consultant) Signed: 10/15/2019 5:54:03 PM By: Linton Ham MD Signed: 10/17/2019 6:50:57 PM By: Monique Hurst RN, BSN Entered By: Linton Ham on 10/15/2019 13:50:30 -------------------------------------------------------------------------------- Multi-Disciplinary Care Plan Details Patient Name: Date of Service: Monique Cabrera, Monique Cabrera 10/15/2019 10:15 AM Medical Record [id number] Patient Account Number: [id number] Date of Birth/Sex: Treating RN: Oct 23, 1935 (84 y.o. Monique Cabrera Primary Care Antavious Spanos: Monique Cabrera Other Clinician: Referring Quantisha Marsicano: Treating Tanyla Stege/Extender:Monique Cabrera, Monique Cabrera in Treatment: 2 Active Inactive Nutrition Nursing Diagnoses: Imbalanced nutrition Impaired glucose control: actual or potential Goals: Patient/caregiver agrees to and verbalizes understanding of need to use nutritional supplements and/or vitamins as prescribed Date Initiated: 10/01/2019 Target Resolution Date: 11/02/2019 Goal Status: Active Patient/caregiver verbalizes understanding of need to maintain therapeutic glucose control per primary care physician Date Initiated: 10/01/2019 Target Resolution Date: 11/02/2019 Goal Status: Active Interventions: Assess HgA1c results as ordered upon admission and as needed Assess patient nutrition upon admission and as needed per policy Provide education on elevated blood sugars and impact on wound healing Provide education on nutrition Treatment Activities: Education provided on Nutrition : 10/08/2019 Notes: Wound/Skin Impairment Nursing Diagnoses: Impaired tissue  integrity Knowledge deficit related to ulceration/compromised skin integrity Goals: Patient/caregiver will verbalize understanding of skin care regimen Date Initiated: 10/01/2019 Target Resolution Date: 11/02/2019 Goal Status: Active Ulcer/skin breakdown will have a volume reduction of 30% by week 4 Date Initiated: 10/01/2019 Target Resolution Date: 11/02/2019 Goal Status: Active Interventions: Assess patient/caregiver ability to obtain necessary supplies Assess patient/caregiver ability to perform ulcer/skin care regimen upon admission and as needed Assess ulceration(s) every visit Provide education on ulcer and skin care Notes: Electronic Signature(s) Signed: 10/17/2019 6:50:57 PM By: Monique Hurst RN, BSN Entered By: Monique Cabrera on 10/15/2019 11:43:45 -------------------------------------------------------------------------------- Pain Assessment Details Patient Name: Date of Service: Monique Cabrera 10/15/2019 10:15 AM Medical Record [id number] Patient Account Number: [id number] Date of Birth/Sex: Treating RN: Sep 20, 1935 (84 y.o. Monique Cabrera Primary Care Anthonee Gelin: Monique Cabrera Other Clinician: Referring Jazlene Bares: Treating Tonette Koehne/Extender:Monique Cabrera, Tiffany Weeks in Treatment: 2 Active Problems Location of Pain Severity and Description of Pain Patient Has Paino Yes Site Locations Rate the pain. Current Pain Level: 5 Pain Management and Medication Current Pain Management: Electronic Signature(s) Signed: 10/17/2019 6:50:57 PM By: Monique Hurst  RN, BSN Signed: 12/04/2019 3:02:15 PM By: Monique Cabrera Entered By: Monique Cabrera on 10/15/2019 11:09:41 -------------------------------------------------------------------------------- Patient/Caregiver Education Details Patient Name: Date of Service: Monique Cabrera 10/19/2020andnbsp10:15 AM Medical Record Patient Account Number: [id number] Number: Treating RN: Monique Cabrera Date of Birth/Gender: [birth year] (84 y.o. F) Other Clinician: Primary Care Physician: Burman Freestone Referring Physician: Physician/Extender: Bevely Palmer in Treatment: 2 Education Assessment Education Provided To: Patient Education Topics Provided Wound/Skin Impairment: Methods: Explain/Verbal Responses: State content correctly Electronic Signature(s) Signed: 10/17/2019 6:50:57 PM By: Monique Hurst RN, BSN Entered By: Monique Cabrera on 10/15/2019 11:43:56 -------------------------------------------------------------------------------- Wound Assessment Details Patient Name: Date of Service: Monique Cabrera 10/15/2019 10:15 AM Medical Record [id number] Patient Account Number: [id number] Date of Birth/Sex: Treating RN: [birth year] (84 y.o. Monique Cabrera Primary Care Cortnie Ringel: Monique Cabrera Other Clinician: Referring Jalexia Lalli: Treating Caisen Mangas/Extender:Monique Cabrera, Tiffany Weeks in Treatment: 2 Wound Status Wound Number: 1 Primary Diabetic Wound/Ulcer of the Lower Extremity Etiology: Wound Location: Right Toe - Web between 4th and 5th Wound Open Status: Wounding Event: Gradually Appeared Comorbid Cataracts, Anemia, Hypertension, Peripheral Date Acquired: 09/05/2019 History: Arterial Disease, Type II Diabetes, Neuropathy, Weeks Of Treatment: 2 Confinement Anxiety Clustered Wound: No Photos Wound Measurements Length: (cm) 1.2 Width: (cm) 0.5 Depth: (cm) 1.7 Area: (cm) 0.471 Volume: (cm) 0.801 Wound Description Classification: Grade 2 Wound Margin: Well defined, not attached Exudate Amount: Medium Exudate Type: Serosanguineous Exudate Color: red, brown Wound Bed Granulation Amount: Large (67-100%) Granulation Quality: Pink, Pale Necrotic Amount: None Present (0%) r After Cleansing: No ibrino No Exposed Structure posed: No (Subcutaneous Tissue) Exposed: Yes posed: No posed: No osed: No sed: No %  Reduction in Area: -401.1% % Reduction in Volume: -215.4% Epithelialization: None Tunneling: No Undermining: No Foul Odo Slough/F Fascia Ex Fat Layer Tendon Ex Muscle Ex Joint Exp Bone Expo Electronic Signature(s) Signed: 10/16/2019 1:32:19 PM By: Mikeal Hawthorne EMT/HBOT Signed: 10/17/2019 6:50:57 PM By: Monique Hurst RN, BSN Entered By: Mikeal Hawthorne on 10/16/2019 12:04:58 -------------------------------------------------------------------------------- Wound Assessment Details Patient Name: Date of Service: Monique Cabrera 10/15/2019 10:15 AM Medical Record [id number] Patient Account Number: [id number] Date of Birth/Sex: Treating RN: Aug 03, 1935 (84 y.o. Monique Cabrera Primary Care Hannahmarie Asberry: Monique Cabrera Other Clinician: Referring Ramello Cordial: Treating Statia Burdick/Extender:Monique Cabrera, Tiffany Weeks in Treatment: 2 Wound Status Wound Number: 2 Primary Diabetic Wound/Ulcer of the Lower Extremity Etiology: Wound Location: Right Foot - Dorsal Wound Open Wounding Event: Gradually Appeared Status: Date Acquired: 10/15/2019 Comorbid Cataracts, Anemia, Hypertension, Peripheral Weeks Of Treatment: 0 History: Arterial Disease, Type II Diabetes, Neuropathy, Clustered Wound: No Confinement Anxiety Photos Wound Measurements Length: (cm) 3.5 Width: (cm) 1.7 Depth: (cm) 0.1 Area: (cm) 4.673 Volume: (cm) 0.467 Wound Description Classification: Unable to visualize wound bed Wound Margin: Flat and Intact Exudate Amount: Small Exudate Type: Serosanguineous Exudate Color: red, brown Wound Bed Granulation Amount: None Present (0%) Necrotic Amount: Large (67-100%) Necrotic Quality: Eschar Exposed Structure sed: No Subcutaneous Tissue) Exposed: No sed: No sed: No ed: No d: No % Reduction in Area: 0% % Reduction in Volume: 0% Epithelialization: None Tunneling: No Undermining: No Fascia Expo Fat Layer ( Tendon Expo Muscle Expo Joint Expos Bone  Expose Electronic Signature(s) Signed: 10/16/2019 1:32:19 PM By: Mikeal Hawthorne EMT/HBOT Signed: 10/17/2019 6:50:57 PM By: Monique Hurst RN, BSN Entered By: Mikeal Hawthorne on 10/16/2019 12:05:23 -------------------------------------------------------------------------------- Vitals Details Patient Name: Date of Service: Monique Cabrera. 10/15/2019 10:15 AM Medical Record [id number] Patient Account Number: [id number] Date of Birth/Sex: Treating RN: 28-Dec-1934 (84 y.o.  Monique Cabrera Primary Care Rayshad Riviello: Monique Cabrera Other Clinician: Referring Balin Vandegrift: Treating Quintell Bonnin/Extender:Monique Cabrera, Tiffany Weeks in Treatment: 2 Vital Signs Time Taken: 11:09 Temperature (F): 98.7 Height (in): 65 Pulse (bpm): 78 Weight (lbs): 138 Respiratory Rate (breaths/min): 18 Body Mass Index (BMI): 23 Blood Pressure (mmHg): 147/60 Reference Range: 80 - 120 mg / dl Electronic Signature(s) Signed: 12/04/2019 3:02:15 PM By: Monique Cabrera Entered By: Monique Cabrera on 10/15/2019 11:09:33

## 2019-12-11 ENCOUNTER — Ambulatory Visit (INDEPENDENT_AMBULATORY_CARE_PROVIDER_SITE_OTHER): Payer: Medicare Other | Admitting: Podiatry

## 2019-12-11 ENCOUNTER — Other Ambulatory Visit: Payer: Self-pay

## 2019-12-11 ENCOUNTER — Ambulatory Visit: Payer: Medicare Other | Admitting: Podiatry

## 2019-12-11 ENCOUNTER — Encounter: Payer: Self-pay | Admitting: Podiatry

## 2019-12-11 DIAGNOSIS — Z89511 Acquired absence of right leg below knee: Secondary | ICD-10-CM

## 2019-12-11 DIAGNOSIS — M722 Plantar fascial fibromatosis: Secondary | ICD-10-CM | POA: Diagnosis not present

## 2019-12-11 DIAGNOSIS — B351 Tinea unguium: Secondary | ICD-10-CM

## 2019-12-11 DIAGNOSIS — E1151 Type 2 diabetes mellitus with diabetic peripheral angiopathy without gangrene: Secondary | ICD-10-CM | POA: Diagnosis not present

## 2019-12-11 NOTE — Patient Instructions (Signed)
Diabetes Mellitus and Foot Care Foot care is an important part of your health, especially when you have diabetes. Diabetes may cause you to have problems because of poor blood flow (circulation) to your feet and legs, which can cause your skin to:  Become thinner and drier.  Break more easily.  Heal more slowly.  Peel and crack. You may also have nerve damage (neuropathy) in your legs and feet, causing decreased feeling in them. This means that you may not notice minor injuries to your feet that could lead to more serious problems. Noticing and addressing any potential problems early is the best way to prevent future foot problems. How to care for your feet Foot hygiene  Wash your feet daily with warm water and mild soap. Do not use hot water. Then, pat your feet and the areas between your toes until they are completely dry. Do not soak your feet as this can dry your skin.  Trim your toenails straight across. Do not dig under them or around the cuticle. File the edges of your nails with an emery board or nail file.  Apply a moisturizing lotion or petroleum jelly to the skin on your feet and to dry, brittle toenails. Use lotion that does not contain alcohol and is unscented. Do not apply lotion between your toes. Shoes and socks  Wear clean socks or stockings every day. Make sure they are not too tight. Do not wear knee-high stockings since they may decrease blood flow to your legs.  Wear shoes that fit properly and have enough cushioning. Always look in your shoes before you put them on to be sure there are no objects inside.  To break in new shoes, wear them for just a few hours a day. This prevents injuries on your feet. Wounds, scrapes, corns, and calluses  Check your feet daily for blisters, cuts, bruises, sores, and redness. If you cannot see the bottom of your feet, use a mirror or ask someone for help.  Do not cut corns or calluses or try to remove them with medicine.  If you  find a minor scrape, cut, or break in the skin on your feet, keep it and the skin around it clean and dry. You may clean these areas with mild soap and water. Do not clean the area with peroxide, alcohol, or iodine.  If you have a wound, scrape, corn, or callus on your foot, look at it several times a day to make sure it is healing and not infected. Check for: ? Redness, swelling, or pain. ? Fluid or blood. ? Warmth. ? Pus or a bad smell. General instructions  Do not cross your legs. This may decrease blood flow to your feet.  Do not use heating pads or hot water bottles on your feet. They may burn your skin. If you have lost feeling in your feet or legs, you may not know this is happening until it is too late.  Protect your feet from hot and cold by wearing shoes, such as at the beach or on hot pavement.  Schedule a complete foot exam at least once a year (annually) or more often if you have foot problems. If you have foot problems, report any cuts, sores, or bruises to your health care provider immediately. Contact a health care provider if:  You have a medical condition that increases your risk of infection and you have any cuts, sores, or bruises on your feet.  You have an injury that is not   healing.  You have redness on your legs or feet.  You feel burning or tingling in your legs or feet.  You have pain or cramps in your legs and feet.  Your legs or feet are numb.  Your feet always feel cold.  You have pain around a toenail. Get help right away if:  You have a wound, scrape, corn, or callus on your foot and: ? You have pain, swelling, or redness that gets worse. ? You have fluid or blood coming from the wound, scrape, corn, or callus. ? Your wound, scrape, corn, or callus feels warm to the touch. ? You have pus or a bad smell coming from the wound, scrape, corn, or callus. ? You have a fever. ? You have a red line going up your leg. Summary  Check your feet every day  for cuts, sores, red spots, swelling, and blisters.  Moisturize feet and legs daily.  Wear shoes that fit properly and have enough cushioning.  If you have foot problems, report any cuts, sores, or bruises to your health care provider immediately.  Schedule a complete foot exam at least once a year (annually) or more often if you have foot problems. This information is not intended to replace advice given to you by your health care provider. Make sure you discuss any questions you have with your health care provider. Document Released: 12/10/2000 Document Revised: 01/25/2018 Document Reviewed: 01/14/2017 Elsevier Patient Education  2020 [employer]  

## 2019-12-13 ENCOUNTER — Other Ambulatory Visit: Payer: Self-pay

## 2019-12-13 ENCOUNTER — Ambulatory Visit (INDEPENDENT_AMBULATORY_CARE_PROVIDER_SITE_OTHER): Payer: Medicare Other | Admitting: Internal Medicine

## 2019-12-13 ENCOUNTER — Encounter: Payer: Self-pay | Admitting: Internal Medicine

## 2019-12-13 DIAGNOSIS — Z66 Do not resuscitate: Secondary | ICD-10-CM | POA: Diagnosis not present

## 2019-12-13 DIAGNOSIS — IMO0002 Reserved for concepts with insufficient information to code with codable children: Secondary | ICD-10-CM

## 2019-12-13 DIAGNOSIS — E11649 Type 2 diabetes mellitus with hypoglycemia without coma: Secondary | ICD-10-CM

## 2019-12-13 DIAGNOSIS — E1129 Type 2 diabetes mellitus with other diabetic kidney complication: Secondary | ICD-10-CM | POA: Diagnosis not present

## 2019-12-13 DIAGNOSIS — E1165 Type 2 diabetes mellitus with hyperglycemia: Secondary | ICD-10-CM

## 2019-12-13 DIAGNOSIS — Z89511 Acquired absence of right leg below knee: Secondary | ICD-10-CM

## 2019-12-13 MED ORDER — INSULIN GLARGINE 100 UNIT/ML ~~LOC~~ SOLN: 20.0000 [IU] | Freq: Every day | SUBCUTANEOUS | 11 refills | Status: DC

## 2019-12-13 NOTE — Patient Instructions (Addendum)
Increase your lantus back to 20 units as long as CBG over 150.  Continue the humalog 3 units at meals if CBG over 150.  Continue to write down your sugars for me.  Call us back if they are staying over 200 OR they begin to drop low.  Continue regular meals.

## 2019-12-13 NOTE — Progress Notes (Signed)
Patient ID: Monique Cabrera, female   DOB: 1935/12/25, 84 y.o.   MRN: [id number] This service is provided via telemedicine  No vital signs collected/recorded due to the encounter was a telemedicine visit.   Location of patient (ex: home, work):  HOME  Patient consents to a telephone visit:  YES  Location of the provider (ex: office, home):  OFFICE  Name of any referring provider:  DR [facility] Jaeger Trueheart, DO  Names of all persons participating in the telemedicine service and their role in the encounter:  PATIENT, Edwin Dada, [place], [place], DO  Time spent on call:  6:33    Provider:  Eliza Grissinger L. Mariea Clonts, D.O., C.M.D.  Code Status: DNR Goals of Care:  Advanced Directives 12/04/2019  Does Patient Have a Medical Advance Directive? Yes  Type of Paramedic of [place];Living will  Does patient want to make changes to medical advance directive? No - Patient declined  Would patient like information on creating a medical advance directive? -  Pre-existing out of facility DNR order (yellow form or pink MOST form) -     Chief Complaint  Patient presents with  . Medical Management of Chronic Issues    4 WEEK FOLLOW-UP, TELEPHONE    HPI: Patient is a 84 y.o. female seen today for medical management of chronic diseases.  I wanted to f/u on her diabetes.    She's proud of being as independent as possible at home since her amputation.    She had a visit for lows on 12/1.  She says her sugar are 269, 299.  This will be in the am.  She takes it before her meals and at bedtime.  259 or so at bedtime, too.  She is to take her lantus 10 units only if CBG>150.  Also has humalog 3 units with meals for CBGs over 150.  She doesn't understand why the sugar is staying about the same in the mid 200s all of the time.    She now has the stump shrinker that was put on yesterday.    It feels better wearing it.  It's tighter.  Sees him next 12/31/19.  She feels reassured about hearing  more about the prosthesis and how that will all work.    Past Medical History:  Diagnosis Date  . Acute upper respiratory infections of unspecified site   . Anemia   . Anemia, unspecified   . Atherosclerosis of native arteries of the extremities, unspecified   . Chest pain, unspecified   . Chronic kidney disease (CKD), stage II (mild)   . Diabetes mellitus   . Diarrhea   . Disorder of bone and cartilage, unspecified   . DM (diabetes mellitus) type II controlled with renal manifestation ([place])   . Herpes zoster with other nervous system complications(053.19)   . Hypercalcemia   . Hypertension   . Hypertensive renal disease, benign   . Nonspecific reaction to tuberculin skin test without active tuberculosis(795.51)   . Nonspecific tuberculin test reaction   . Other and unspecified hyperlipidemia   . Pain in joint, lower leg   . Peripheral arterial disease ([place])   . Postherpetic neuralgia   . Proteinuria   . Stroke ([place]) 01/2017  . Type II or unspecified type diabetes mellitus with renal manifestations, not stated as uncontrolled(250.40)   . Type II or unspecified type diabetes mellitus with renal manifestations, uncontrolled(250.42)   . Unspecified disorder of kidney and ureter   . Unspecified essential hypertension  Past Surgical History:  Procedure Laterality Date  . ABDOMINAL AORTOGRAM W/LOWER EXTREMITY N/A 10/31/2019   Procedure: ABDOMINAL AORTOGRAM W/LOWER EXTREMITY;  Surgeon: Wellington Hampshire, MD;  Location: [facility];  Service: Cardiovascular;  Laterality: N/A;  . AMPUTATION Right 11/02/2019   Procedure: AMPUTATION BELOW KNEE RIGHT;  Surgeon: Rosetta Posner, MD;  Location: [place];  Service: Vascular;  Laterality: Right;  . hysterectomy    . INCISION AND DRAINAGE Left 05/27/14   sebacous cyst, ear  . PRP Left    Dr. Ricki Miller  . removal of cyst from hand    . removal of tumor from foot    . TONSILLECTOMY      Allergies  Allergen Reactions  . Invokana  [Canagliflozin] Itching, Swelling and Other (See Comments)    Vaginal itching, swelling and irritation  . Jardiance [Empagliflozin] Itching, Swelling and Other (See Comments)    Vaginal itching and swelling    Outpatient Encounter Medications as of 12/13/2019  Medication Sig  . amLODipine (NORVASC) 10 MG tablet TAKE 1 TABLET (10 MG TOTAL) BY MOUTH DAILY. FOR HIGH BLOOD PRESSURE  . aspirin 81 MG chewable tablet Chew 1 tablet (81 mg total) by mouth daily.  Marland Kitchen atorvastatin (LIPITOR) 20 MG tablet TAKE 1 TABLET BY MOUTH EVERY DAY  . carvedilol (COREG) 25 MG tablet TAKE 1 TABLET BY MOUTH TWICE A DAY WITH A MEAL  . clopidogrel (PLAVIX) 75 MG tablet TAKE 1 TABLET BY MOUTH EVERY DAY  . glucose 4 GM chewable tablet Chew 1 tablet (4 g total) by mouth as needed for low blood sugar.  . insulin glargine (LANTUS) 100 UNIT/ML injection Inject 0.28 mLs (28 Units total) into the skin daily.  . insulin lispro (HUMALOG KWIKPEN) 100 UNIT/ML KwikPen Inject 0.03 mLs (3 Units total) into the skin 3 (three) times daily.  . Insulin Pen Needle (B-D ULTRAFINE III SHORT PEN) 31G X 8 MM MISC Use to check blood sugar every day. Dx: 11.29; 11.65  . ONETOUCH VERIO test strip USE TO TEST BLOOD SUGAR THREE TIMES DAILY. DX: E11.9  . valsartan-hydrochlorothiazide (DIOVAN-HCT) 320-25 MG tablet Take 1 tablet by mouth daily.  . [DISCONTINUED] HYDROcodone-acetaminophen (NORCO/VICODIN) 5-325 MG tablet Take 1-2 tablets by mouth every 4 (four) hours as needed for moderate pain.   No facility-administered encounter medications on file as of 12/13/2019.    Review of Systems:  Review of Systems  Constitutional: Negative for chills, fever and malaise/fatigue.  HENT: Negative for congestion, hearing loss and sore throat.   Eyes: Negative for blurred vision.       Glasses  Respiratory: Negative for cough and shortness of breath.   Cardiovascular: Negative for chest pain, palpitations and leg swelling.  Gastrointestinal: Negative for  abdominal pain, constipation, diarrhea, nausea and vomiting.  Genitourinary: Negative for dysuria.  Musculoskeletal: Negative for back pain, falls and joint pain.       S/p BKA  Skin: Negative for itching and rash.       Some dry skin on stump  Neurological: Negative for dizziness and loss of consciousness.  Endo/Heme/Allergies:       Diabetes--hyperglycemia  Psychiatric/Behavioral: Positive for memory loss. Negative for depression. The patient is not nervous/anxious.        MCI    Health Maintenance  Topic Date Due  . HEMOGLOBIN A1C  04/27/2020  . OPHTHALMOLOGY EXAM  07/25/2020  . FOOT EXAM  11/14/2020  . INFLUENZA VACCINE  Completed  . DEXA SCAN  Completed  . PNA vac  Low Risk Adult  Completed    Physical Exam: Could not be performed as visit non face-to-face via phone  Labs reviewed: Basic Metabolic Panel: Recent Labs    10/29/19 2336 10/31/19 0404 11/01/19 0403 11/04/19 0459 11/07/19 1439 11/13/19 1636  NA  --  132* 132* 133* 134* 133*  K  --  3.3* 3.9 3.9 4.3 3.3*  CL  --  102 103 103 102 101  CO2  --  19* 19* 20* 25 22  GLUCOSE  --  124* 219* 138* 201* 119*  BUN  --  21 24* 23 16 20   CREATININE  --  1.29* 1.37* 1.20* 1.16* 1.27*  CALCIUM  --  10.2 9.9 9.9 9.3 9.6  MG 1.6* 1.8 1.7  --   --   --   PHOS 2.1*  --  1.8*  --   --   --   TSH 0.989  --   --   --   --   --    Liver Function Tests: Recent Labs    09/11/19 1136 09/17/19 1518 10/29/19 1309 10/31/19 0404 11/01/19 0403  AST 18 13 26 25   --   ALT 13 8 19 17   --   ALKPHOS 59  --  68 82  --   BILITOT 0.6 0.4 1.1 0.7  --   PROT 6.8 6.7 6.3* 5.9*  --   ALBUMIN 3.4*  --  1.9* 1.7* 1.5*   No results for input(s): LIPASE, AMYLASE in the last 8760 hours. No results for input(s): AMMONIA in the last 8760 hours. CBC: Recent Labs    11/03/19 0254 11/04/19 1041 11/07/19 1439 11/13/19 1636  WBC 23.0* 19.1* 17.6* 11.3*  NEUTROABS 16.8*  --  13,904* 7.9*  HGB 7.4* 7.8* 7.4* 9.0*  HCT 22.1* 23.7*  22.9* 28.0*  MCV 88.0 86.8 90.9 94.3  PLT 400 443* 406* 374   Lipid Panel: Recent Labs    02/07/19 0900  CHOL 121  HDL 48*  LDLCALC 60  TRIG 53  CHOLHDL 2.5   Lab Results  Component Value Date   HGBA1C 8.9 (H) 10/29/2019    Procedures since last visit: CT Head Wo Contrast  Result Date: 11/13/2019 CLINICAL DATA:  Encephalopathy, unresponsive episode, concern for stroke or bleed. EXAM: CT HEAD WITHOUT CONTRAST TECHNIQUE: Contiguous axial images were obtained from the base of the skull through the vertex without intravenous contrast. COMPARISON:  Head CT 10/29/2019. FINDINGS: Brain: No evidence of acute intracranial hemorrhage. No demarcated cortical infarction. No evidence of intracranial mass. No midline shift or extra-axial fluid collection. Mild ill-defined hypoattenuation of the cerebral white matter is nonspecific, but consistent with chronic small vessel ischemic disease. Mild generalized parenchymal atrophy. Vascular: No hyperdense vessel.  Atherosclerotic calcifications. Skull: Normal. Negative for fracture or focal lesion. Sinuses/Orbits: Visualized orbits demonstrate no acute abnormality. Partial opacification of right ethmoid air cells. Mucosal thickening within the partially imaged right maxillary sinus. No significant mastoid effusion. IMPRESSION: 1. No evidence of acute intracranial abnormality. 2. Mild generalized parenchymal atrophy and chronic small vessel ischemic disease. 3. Paranasal sinus disease as described. Correlate for acute sinusitis. Electronically Signed   By: Kellie Simmering DO   On: 11/13/2019 19:24   DG Foot Complete Left  Result Date: 11/13/2019 Please see detailed radiograph report in office note.   Assessment/Plan 1. Type II diabetes mellitus with renal manifestations, uncontrolled ([place]) Lab Results  Component Value Date   HGBA1C 8.9 (H) 10/29/2019  -has had no more lows -CBGs all running  in mid-200s regardless of time of day -will increase lantus  from 10 units to 20 units as long as cbg over 150 -cont humalog 3 units with meals for cbg over 150 -cont regular meals -f/u in 6 wks  2. Status post below-knee amputation of right lower extremity ([place]) -recovering well, now has stump shrinker and functioning quite well independently now -she seems more invested in her care after going through this   3. Hypoglycemia due to type 2 diabetes mellitus ([place]) -none since insulin was dramatically lowered but now sugars have not been below 259 by her report which is way too high -insulin adjusted as above -reinforced importance of regular healthy meals  Labs/tests ordered:  No new; will order labs for before her March/April routine visit when hba1c due again Next appt:  02/12/2020  Non face-to-face time spent on televisit:  27 mins  Gemma Ruan L. Ramie Erman, D.O. [facility] [address], [place] [address] Cell Phone (Mon-Fri 8am-5pm):  [phone] On Call:  [phone] & follow prompts after 5pm & weekends Office Phone:  [phone] Office Fax:  [phone]

## 2019-12-23 NOTE — Progress Notes (Signed)
Subjective: Monique Cabrera is a 84 y.o. y.o. female with h/o diabetes with PAD who presents today for preventative diabetic foot care. Patient has h/o right BKA.    She has question regarding her left arch area where she feels a "bump". Denies any trauma to foot.  Cabrera, Monique L, DO is patient's PCP.   Medications reviewed in chart.  Allergies  Allergen Reactions  . Invokana [Canagliflozin] Itching, Swelling and Other (See Comments)    Vaginal itching, swelling and irritation  . Jardiance [Empagliflozin] Itching, Swelling and Other (See Comments)    Vaginal itching and swelling    Objective: There were no vitals filed for this visit.  Vascular Examination: Capillary refill time <3 seconds to digits left foot.  Dorsalis pedis and posterior tibial pulses nonpalpable left LE.  Digital hair absent LLE.  Skin temperature gradient WNL LLE.  Dermatological Examination: Skin thin, shiny and atrophic left foot.  She has healing scab noted on the dorsum of the left foot. No erythema, no edema, no drainage, no swelling, no signs of infection.  Toenails 1-5 left discolored, thick, dystrophic with subungual debris.  Musculoskeletal: Muscle strength 5/5 to all LE muscle groups b/l.  Fixed, palpable nodule plantar arch left foot. No pain on palpation. No erythema, no edema, no drainage, no flocculence. Characteristic of plantar fibroma.  HAV with bunion LLE.  Neurological: Sensation diminished with 10 gram monofilament.  Last A1c:  Hemoglobin A1C Latest Ref Rng & Units 10/29/2019 06/27/2019 02/07/2019  HGBA1C 4.8 - 5.6 % 8.9(H) 9.5(H) 8.9(H)  Some recent data might be hidden    Assessment: 1.  Painful onychomycosis toenails 1-5 left foot 2.  Plantar fibroma left foot 3.  Status post BKA right LE 4.  NIDDM  Plan: 1. Discussed plantar fibroma. Currently asymptomatic. Monitor. Continue diabetic foot care principles. Literature dispensed on today. 2. Toenails 1-5 left foot  were debrided in length and girth without iatrogenic bleeding. 3. Patient to continue soft, supportive shoe gear daily. 4. Patient to report any pedal injuries to medical professional immediately. 5. Follow up 9 weeks.  6. Patient/POA to call should there be a concern in the interim.

## 2019-12-29 ENCOUNTER — Other Ambulatory Visit: Payer: Self-pay | Admitting: Internal Medicine

## 2019-12-31 ENCOUNTER — Other Ambulatory Visit: Payer: Self-pay | Admitting: *Deleted

## 2019-12-31 NOTE — Patient Outreach (Signed)
Outreach call to pt for telephone assessment, pt reports " I'm doing very good right now, in the process of getting a prosthesis so I can walk"  Pt states she saw MD today but cannot remember his name and " he looked at my leg and says everything looking ok"  Pt reports lantus insulin dosage changed again recently, increased to 20 units from 10 units and pt feels blood sugar " is running a little high around 200 most of the time" but pt feels better that she has had no low readings recently, pt states she would like referral for endocrinolgist,  Pt states she continues to have assistance in the home 1.5 hours daily/ 3 days per week, pt states her amputation site has healed well.  RN CM faxed note to primary care provider regarding referral for endocrinologist per pt request.  [facility] CM Care Plan Problem One     Most Recent Value  Care Plan Problem One  Knowledge deficit related to self management of diabetes  Role Documenting the Problem One  Care Management [place] for Problem One  Active  THN Long Term Goal   Pt will verbalize, demonstrate improved self care for diabetes management within 60 days aeb better glucose control and wound healing  THN Long Term Goal Start Date  12/31/19 Barrie Folk re-established]  Interventions for Problem One Long Term Goal  RN CM reviewed plan of care with pt, insulin dosage has recently been adjusted, pt wants referral to endocrinologist and states this was started but pt did not hear back from anyone  THN CM Short Term Goal #1   Pt will verbalize plate method within 30 days  THN CM Short Term Goal #1 Start Date  10/17/19  [facility] CM Short Term Goal #1 Met Date  11/27/19  THN CM Short Term Goal #2   Pt will verbalize signs/ symptoms hypoglycemia and actions to take within 30 days  THN CM Short Term Goal #2 Start Date  10/17/19 [goal re-established]  THN CM Short Term Goal #2 Met Date  12/31/19  Interventions for Short Term Goal #2  RNCM reviewed signs/ symptoms  hypoglycemia and actions to take, pt denies any episodes of hypoglycemia since last conversation  THN CM Short Term Goal #3  Pt will show improvement in wound healing within 30 days  THN CM Short Term Goal #3 Start Date  10/17/19  [facility] CM Short Term Goal #3 Met Date  11/27/19  THN CM Short Term Goal #4  Pt will have appointment with endocrinogist within 30 days  THN CM Short Term Goal #4 Start Date  12/31/19  Interventions for Short Term Goal #4  RN CM faxed note to primary care provider Hollace Kinnier DO requesting referral to endocrinologist     [facility] CM Care Plan Problem Two     Most Recent Value  Care Plan Problem Two  High risk for falls  Role Documenting the Problem Two  Care Management [place] for Problem Two  Active  THN CM Short Term Goal #1   Pt will report no falls and adhere to safety precautions within 30 days  THN CM Short Term Goal #1 Start Date  10/17/19  [facility] CM Short Term Goal #1 Met Date   11/27/19      PLAN Outreach pt in 1-2 weeks for follow up on endocrinologist referral  Jacqlyn Larsen [facility], BSN [place] Coordinator [phone]

## 2020-01-04 DIAGNOSIS — I639 Cerebral infarction, unspecified: Secondary | ICD-10-CM | POA: Diagnosis not present

## 2020-01-04 DIAGNOSIS — I96 Gangrene, not elsewhere classified: Secondary | ICD-10-CM | POA: Diagnosis not present

## 2020-01-07 ENCOUNTER — Other Ambulatory Visit: Payer: Self-pay | Admitting: *Deleted

## 2020-01-07 NOTE — Patient Outreach (Signed)
Outreach call to pt to discuss primary care provider will not be completing referral to endocrinologist and to reinforce pt needs to eat regularly and take insulin as directed.  No answer to telephone and no option to leave voicemail.  RN CM mailed unsuccessful outreach letter to pt home.  PLAN Outreach pt in 3-4 business days  Jacqlyn Larsen [facility], [place] [phone]

## 2020-01-10 ENCOUNTER — Other Ambulatory Visit: Payer: Self-pay | Admitting: *Deleted

## 2020-01-10 NOTE — Patient Outreach (Signed)
Outreach call to pt to inform about status of referral to diabetes specialist, spoke with pt, HIPAA verified,  RNCM informed pt that referral has not been made and primary care provider sent message with update that pt needs to eat regularly and take insulin as directed.  Pt states she will discuss further at next appointment with primary care provider.  Pt feels amputation site has healed well and pt is in process of getting prosthesis.  No new goals identified.  THN CM Care Plan Problem One     Most Recent Value  Care Plan Problem One  Knowledge deficit related to self management of diabetes  Role Documenting the Problem One  Care Management Coordinator  Care Plan for Problem One  Active  THN Long Term Goal   Pt will verbalize, demonstrate improved self care for diabetes management within 60 days aeb better glucose control and wound healing  THN Long Term Goal Start Date  12/31/19 [goal re-established]  THN Long Term Goal Met Date  01/10/20  Interventions for Problem One Long Term Goal  RN CM reviewed discharge plan and will close case today,  Pt does not verbalize any further goals to work towards.  RN CM informed pt that primary care provider will not be making referral to endocrinologist at present and advises pt to eat regularly and take insulin as directed, pt plans to discuss further with primary care provider about referral to endocrinologist.   RN CM mailed case closure letter to pt home and faxed case closure letter to primary care provider.  THN CM Short Term Goal #1   Pt will verbalize plate method within 30 days  THN CM Short Term Goal #1 Start Date  10/17/19  [facility] CM Short Term Goal #1 Met Date  11/27/19  THN CM Short Term Goal #2   Pt will verbalize signs/ symptoms hypoglycemia and actions to take within 30 days  THN CM Short Term Goal #2 Start Date  10/17/19 [goal re-established]  THN CM Short Term Goal #2 Met Date  12/31/19  THN CM Short Term Goal #3  Pt will show improvement in  wound healing within 30 days  THN CM Short Term Goal #3 Start Date  10/17/19  [facility] CM Short Term Goal #3 Met Date  11/27/19  THN CM Short Term Goal #4  Pt will have appointment with endocrinogist within 30 days  THN CM Short Term Goal #4 Start Date  12/31/19  Interventions for Short Term Goal #4  Referral to endocrinologist will not be completed at present,  pt aware.    THN CM Care Plan Problem Two     Most Recent Value  Care Plan Problem Two  High risk for falls  Role Documenting the Problem Two  Care Management Coordinator  Care Plan for Problem Two  Active  THN CM Short Term Goal #1   Pt will report no falls and adhere to safety precautions within 30 days  THN CM Short Term Goal #1 Start Date  10/17/19  [facility] CM Short Term Goal #1 Met Date   11/27/19      Close case  Jacqlyn Larsen [facility], BSN [place] Coordinator [phone]

## 2020-01-24 ENCOUNTER — Ambulatory Visit (INDEPENDENT_AMBULATORY_CARE_PROVIDER_SITE_OTHER): Payer: Medicare Other | Admitting: Internal Medicine

## 2020-01-24 ENCOUNTER — Encounter: Payer: Self-pay | Admitting: Internal Medicine

## 2020-01-24 ENCOUNTER — Other Ambulatory Visit: Payer: Self-pay

## 2020-01-24 VITALS — BP 128/78 | HR 75 | Temp 97.1°F | Ht 65.0 in | Wt 133.0 lb

## 2020-01-24 DIAGNOSIS — I739 Peripheral vascular disease, unspecified: Secondary | ICD-10-CM

## 2020-01-24 DIAGNOSIS — E1122 Type 2 diabetes mellitus with diabetic chronic kidney disease: Secondary | ICD-10-CM | POA: Diagnosis not present

## 2020-01-24 DIAGNOSIS — N183 Chronic kidney disease, stage 3 unspecified: Secondary | ICD-10-CM

## 2020-01-24 DIAGNOSIS — I5032 Chronic diastolic (congestive) heart failure: Secondary | ICD-10-CM

## 2020-01-24 DIAGNOSIS — E1165 Type 2 diabetes mellitus with hyperglycemia: Secondary | ICD-10-CM | POA: Diagnosis not present

## 2020-01-24 DIAGNOSIS — E1129 Type 2 diabetes mellitus with other diabetic kidney complication: Secondary | ICD-10-CM

## 2020-01-24 DIAGNOSIS — I10 Essential (primary) hypertension: Secondary | ICD-10-CM | POA: Diagnosis not present

## 2020-01-24 DIAGNOSIS — Z794 Long term (current) use of insulin: Secondary | ICD-10-CM

## 2020-01-24 DIAGNOSIS — IMO0002 Reserved for concepts with insufficient information to code with codable children: Secondary | ICD-10-CM

## 2020-01-24 DIAGNOSIS — Z89511 Acquired absence of right leg below knee: Secondary | ICD-10-CM

## 2020-01-24 LAB — GLUCOSE, POCT (MANUAL RESULT ENTRY): POC Glucose: 173 mg/dl — AB (ref 70–99)

## 2020-01-24 MED ORDER — INSULIN LISPRO (1 UNIT DIAL) 100 UNIT/ML (KWIKPEN): PEN_INJECTOR | SUBCUTANEOUS | 5 refills | Status: DC

## 2020-01-24 NOTE — Patient Instructions (Addendum)
Please adjust your insulin: Continue lantus 20 units daily as long as CBG over 150. Increase your humalog at meals:  6 units at breakfast if over 150, 4 units at lunch if over 150, and 6 units at supper if over 150. Call me if you get readings under 150. BRING YOUR GLUCOMETER TO EACH VISIT WITH ME.  Keep your appt about your leg for next Tuesday, 2/2.   Hypoglycemia Hypoglycemia is when the sugar (glucose) level in your blood is too low. Signs of low blood sugar may include:  Feeling: ? Hungry. ? Worried or nervous (anxious). ? Sweaty and clammy. ? Confused. ? Dizzy. ? Sleepy. ? Sick to your stomach (nauseous).  Having: ? A fast heartbeat. ? A headache. ? A change in your vision. ? Tingling or no feeling (numbness) around your mouth, lips, or tongue. ? Jerky movements that you cannot control (seizure).  Having trouble with: ? Moving (coordination). ? Sleeping. ? Passing out (fainting). ? Getting upset easily (irritability). Low blood sugar can happen to people who have diabetes and people who do not have diabetes. Low blood sugar can happen quickly, and it can be an emergency. Treating low blood sugar Low blood sugar is often treated by eating or drinking something sugary right away, such as:  Fruit juice, 4-6 oz (120-150 mL).  Regular soda (not diet soda), 4-6 oz (120-150 mL).  Low-fat milk, 4 oz (120 mL).  Several pieces of hard candy.  Sugar or honey, 1 Tbsp (15 mL). Treating low blood sugar if you have diabetes If you can think clearly and swallow safely, follow the 15:15 rule:  Take 15 grams of a fast-acting carb (carbohydrate). Talk with your doctor about how much you should take.  Always keep a source of fast-acting carb with you, such as: ? Sugar tablets (glucose pills). Take 3-4 pills. ? 6-8 pieces of hard candy. ? 4-6 oz (120-150 mL) of fruit juice. ? 4-6 oz (120-150 mL) of regular (not diet) soda. ? 1 Tbsp (15 mL) honey or sugar.  Check your blood  sugar 15 minutes after you take the carb.  If your blood sugar is still at or below 70 mg/dL (3.9 mmol/L), take 15 grams of a carb again.  If your blood sugar does not go above 70 mg/dL (3.9 mmol/L) after 3 tries, get help right away.  After your blood sugar goes back to normal, eat a meal or a snack within 1 hour.  Treating very low blood sugar If your blood sugar is at or below 54 mg/dL (3 mmol/L), you have very low blood sugar (severe hypoglycemia). This may also cause:  Passing out.  Jerky movements you cannot control (seizure).  Losing consciousness (coma). This is an emergency. Do not wait to see if the symptoms will go away. Get medical help right away. Call your local emergency services (911 in the [place] Do not drive yourself to the hospital. If you have very low blood sugar and you cannot eat or drink, you may need a glucagon shot (injection). A family member or friend should learn how to check your blood sugar and how to give you a glucagon shot. Ask your doctor if you need to have a glucagon shot kit at home. Follow these instructions at home: General instructions  Take over-the-counter and prescription medicines only as told by your doctor.  Stay aware of your blood sugar as told by your doctor.  Limit alcohol intake to no more than 1 drink a day for  nonpregnant women and 2 drinks a day for men. One drink equals 12 oz of beer (355 mL), 5 oz of wine (148 mL), or 1 oz of hard liquor (44 mL).  Keep all follow-up visits as told by your doctor. This is important. If you have diabetes:   Follow your diabetes care plan as told by your doctor. Make sure you: ? Know the signs of low blood sugar. ? Take your medicines as told. ? Follow your exercise and meal plan. ? Eat on time. Do not skip meals. ? Check your blood sugar as often as told by your doctor. Always check it before and after exercise. ? Follow your sick day plan when you cannot eat or drink normally. Make this  plan ahead of time with your doctor.  Share your diabetes care plan with: ? Your work or school. ? People you live with.  Check your pee (urine) for ketones: ? When you are sick. ? As told by your doctor.  Carry a card or wear jewelry that says you have diabetes. Contact a doctor if:  You have trouble keeping your blood sugar in your target range.  You have low blood sugar often. Get help right away if:  You still have symptoms after you eat or drink something sugary.  Your blood sugar is at or below 54 mg/dL (3 mmol/L).  You have jerky movements that you cannot control.  You pass out. These symptoms may be an emergency. Do not wait to see if the symptoms will go away. Get medical help right away. Call your local emergency services (911 in the [place] Do not drive yourself to the hospital. Summary  Hypoglycemia happens when the level of sugar (glucose) in your blood is too low.  Low blood sugar can happen to people who have diabetes and people who do not have diabetes. Low blood sugar can happen quickly, and it can be an emergency.  Make sure you know the signs of low blood sugar and know how to treat it.  Always keep a source of sugar (fast-acting carb) with you to treat low blood sugar. This information is not intended to replace advice given to you by your health care provider. Make sure you discuss any questions you have with your health care provider. Document Revised: 04/05/2019 Document Reviewed: 01/16/2016 Elsevier Patient Education  2020 [employer].

## 2020-01-24 NOTE — Progress Notes (Signed)
Location:  [facility] clinic Provider:  Rodrick Payson L. Mariea Clonts, D.O., C.M.D.  Code Status: DNR Goals of Care:  Advanced Directives 01/24/2020  Does Patient Have a Medical Advance Directive? Yes  Type of Advance Directive Out of facility DNR (pink MOST or yellow form)  Does patient want to make changes to medical advance directive? -  Would patient like information on creating a medical advance directive? -  Pre-existing out of facility DNR order (yellow form or pink MOST form) -     Chief Complaint  Patient presents with  . Medical Management of Chronic Issues    6 week follow up on Diabetes ,     HPI: Patient is a 85 y.o. female seen today for medical management of chronic diseases--6 week f/u on diabetes.    CBGs reviewed with her today:  Last time after insulin was reduced to just 10 units of lantus during her hospitalization when she was acutely ill, cbgs had been running in the mid-200s all day.  She was also on humalog 3 units with meals for cbg >150.  I had increased her lantus dose to 20 units.  CBG this am was 173 (had forgotten to do at home).  She did not bring her cbg machine.  She does not think it's correct.  She got some kind of date/time error.  CBGs 220, 250, 1 or 2 have been 300.  She has been taking lantus 20 units and 3 units of humalog with meals.  She feels good.   She's not had any CBGs less than 150.  She eats the most at breakfast and dinner, less for lunch.    She has been eating "pretty good".  Sometimes eats a little, but not a whole lot, then starts back eating.  Her son had come up to see her and when he left he kissed everybody on the outside of their masks (this was three weeks ago).  Then her other son had a sore throat and a day or two later she had a sore throat.  Everybody within the week, had a sore throat a couple of days each.  Then they were ok after that.    She is on the wait list at [facility] for the covid vaccine.  Counseled about answering her phone for  that.  Chronic diastolic chf:  No sob, no swelling.  Only on hctz.  BP at goal.  No dizziness or lightheadedness.    Past Medical History:  Diagnosis Date  . Acute upper respiratory infections of unspecified site   . Amputee 08/2019  . Anemia   . Anemia, unspecified   . Atherosclerosis of native arteries of the extremities, unspecified   . Chest pain, unspecified   . Chronic kidney disease (CKD), stage II (mild)   . Diabetes mellitus   . Diarrhea   . Disorder of bone and cartilage, unspecified   . DM (diabetes mellitus) type II controlled with renal manifestation ([place])   . Herpes zoster with other nervous system complications(053.19)   . Hypercalcemia   . Hypertension   . Hypertensive renal disease, benign   . Nonspecific reaction to tuberculin skin test without active tuberculosis(795.51)   . Nonspecific tuberculin test reaction   . Other and unspecified hyperlipidemia   . Pain in joint, lower leg   . Peripheral arterial disease ([place])   . Postherpetic neuralgia   . Proteinuria   . Stroke ([place]) 01/2017  . Type II or unspecified type diabetes mellitus with renal manifestations,  not stated as uncontrolled(250.40)   . Type II or unspecified type diabetes mellitus with renal manifestations, uncontrolled(250.42)   . Unspecified disorder of kidney and ureter   . Unspecified essential hypertension     Past Surgical History:  Procedure Laterality Date  . ABDOMINAL AORTOGRAM W/LOWER EXTREMITY N/A 10/31/2019   Procedure: ABDOMINAL AORTOGRAM W/LOWER EXTREMITY;  Surgeon: Wellington Hampshire, MD;  Location: [facility];  Service: Cardiovascular;  Laterality: N/A;  . AMPUTATION Right 11/02/2019   Procedure: AMPUTATION BELOW KNEE RIGHT;  Surgeon: Rosetta Posner, MD;  Location: [facility];  Service: Vascular;  Laterality: Right;  . hysterectomy    . INCISION AND DRAINAGE Left 05/27/14   sebacous cyst, ear  . PRP Left    Dr. Ricki Miller  . removal of cyst from hand    . removal of tumor from  foot    . TONSILLECTOMY      Allergies  Allergen Reactions  . Invokana [Canagliflozin] Itching, Swelling and Other (See Comments)    Vaginal itching, swelling and irritation  . Jardiance [Empagliflozin] Itching, Swelling and Other (See Comments)    Vaginal itching and swelling    Outpatient Encounter Medications as of 01/24/2020  Medication Sig  . amLODipine (NORVASC) 10 MG tablet TAKE 1 TABLET (10 MG TOTAL) BY MOUTH DAILY. FOR HIGH BLOOD PRESSURE  . aspirin 81 MG chewable tablet Chew 1 tablet (81 mg total) by mouth daily.  Marland Kitchen atorvastatin (LIPITOR) 20 MG tablet TAKE 1 TABLET BY MOUTH EVERY DAY  . carvedilol (COREG) 25 MG tablet TAKE 1 TABLET BY MOUTH TWICE A DAY WITH MEALS  . clopidogrel (PLAVIX) 75 MG tablet TAKE 1 TABLET BY MOUTH EVERY DAY  . glucose 4 GM chewable tablet Chew 1 tablet (4 g total) by mouth as needed for low blood sugar.  . insulin glargine (LANTUS) 100 UNIT/ML injection Inject 0.2 mLs (20 Units total) into the skin daily.  . insulin lispro (HUMALOG KWIKPEN) 100 UNIT/ML KwikPen Inject 0.03 mLs (3 Units total) into the skin 3 (three) times daily.  . Insulin Pen Needle (B-D ULTRAFINE III SHORT PEN) 31G X 8 MM MISC Use to check blood sugar every day. Dx: 11.29; 11.65  . ONETOUCH VERIO test strip USE TO TEST BLOOD SUGAR THREE TIMES DAILY. DX: E11.9  . valsartan-hydrochlorothiazide (DIOVAN-HCT) 320-25 MG tablet Take 1 tablet by mouth daily.   No facility-administered encounter medications on file as of 01/24/2020.    Review of Systems:  Review of Systems  Constitutional: Negative for chills, fever and malaise/fatigue.  HENT: Negative for congestion.   Eyes: Positive for blurred vision.       Uses magnifying glass to read  Respiratory: Negative for cough and shortness of breath.   Cardiovascular: Negative for chest pain, palpitations and leg swelling.  Gastrointestinal: Negative for abdominal pain, blood in stool, constipation, diarrhea and melena.  Genitourinary:  Negative for dysuria.  Musculoskeletal: Negative for falls, joint pain and myalgias.  Skin: Negative for itching and rash.       Amputation site healed  Neurological: Negative for dizziness and loss of consciousness.  Psychiatric/Behavioral: Positive for memory loss. Negative for depression. The patient is not nervous/anxious.     Health Maintenance  Topic Date Due  . HEMOGLOBIN A1C  04/27/2020  . OPHTHALMOLOGY EXAM  07/25/2020  . FOOT EXAM  11/14/2020  . INFLUENZA VACCINE  Completed  . DEXA SCAN  Completed  . PNA vac Low Risk Adult  Completed    Physical Exam: Vitals:  01/24/20 0807  Height: 5\' 5"  (1.651 m)   Body mass index is 22.13 kg/m. Physical Exam Vitals reviewed.  Constitutional:      General: She is not in acute distress.    Appearance: She is not toxic-appearing.  HENT:     Head: Normocephalic and atraumatic.  Cardiovascular:     Rate and Rhythm: Normal rate and regular rhythm.     Pulses: Normal pulses.     Heart sounds: Normal heart sounds.  Pulmonary:     Effort: Pulmonary effort is normal.     Breath sounds: No wheezing, rhonchi or rales.  Abdominal:     General: Bowel sounds are normal.  Musculoskeletal:        General: Normal range of motion.     Right lower leg: No edema.     Left lower leg: No edema.  Skin:    General: Skin is warm and dry.     Comments: Right LE amputation site healing well  Neurological:     General: No focal deficit present.     Mental Status: She is alert and oriented to person, place, and time.     Comments: But short-term memory loss becoming more prominent each visit  Psychiatric:        Mood and Affect: Mood normal.     Labs reviewed: Basic Metabolic Panel: Recent Labs    10/29/19 2336 10/30/19 0552 10/31/19 0404 10/31/19 0404 11/01/19 0403 11/02/19 0521 11/04/19 0459 11/07/19 1439 11/13/19 1636  NA  --    < > 132*   < > 132*   < > 133* 134* 133*  K  --    < > 3.3*   < > 3.9   < > 3.9 4.3 3.3*  CL  --     < > 102   < > 103   < > 103 102 101  CO2  --    < > 19*   < > 19*   < > 20* 25 22  GLUCOSE  --    < > 124*   < > 219*   < > 138* 201* 119*  BUN  --    < > 21   < > 24*   < > 23 16 20   CREATININE  --    < > 1.29*   < > 1.37*   < > 1.20* 1.16* 1.27*  CALCIUM  --    < > 10.2   < > 9.9   < > 9.9 9.3 9.6  MG 1.6*  --  1.8  --  1.7  --   --   --   --   PHOS 2.1*  --   --   --  1.8*  --   --   --   --   TSH 0.989  --   --   --   --   --   --   --   --    < > = values in this interval not displayed.   Liver Function Tests: Recent Labs    09/11/19 1136 09/11/19 1136 09/17/19 1518 10/29/19 1309 10/31/19 0404 11/01/19 0403  AST 18   < > 13 26 25   --   ALT 13   < > 8 19 17   --   ALKPHOS 59  --   --  68 82  --   BILITOT 0.6   < > 0.4 1.1 0.7  --   PROT 6.8   < >  6.7 6.3* 5.9*  --   ALBUMIN 3.4*   < >  --  1.9* 1.7* 1.5*   < > = values in this interval not displayed.   No results for input(s): LIPASE, AMYLASE in the last 8760 hours. No results for input(s): AMMONIA in the last 8760 hours. CBC: Recent Labs    11/03/19 0254 11/03/19 0254 11/04/19 1041 11/07/19 1439 11/13/19 1636  WBC 23.0*   < > 19.1* 17.6* 11.3*  NEUTROABS 16.8*  --   --  13,904* 7.9*  HGB 7.4*   < > 7.8* 7.4* 9.0*  HCT 22.1*   < > 23.7* 22.9* 28.0*  MCV 88.0   < > 86.8 90.9 94.3  PLT 400   < > 443* 406* 374   < > = values in this interval not displayed.   Lipid Panel: Recent Labs    02/07/19 0900  CHOL 121  HDL 48*  LDLCALC 60  TRIG 53  CHOLHDL 2.5   Lab Results  Component Value Date   HGBA1C 8.9 (H) 10/29/2019   Assessment/Plan 1. Type II diabetes mellitus with renal manifestations, uncontrolled (HCC) - hba1c was 10/28/20 8.9 during hospitalization  - POC Glucose (CBG) today was 256  - Basic metabolic panel; Future - Hemoglobin A1c; Future - Lipid panel; Future -plan:  Continue lantus 20 units daily Increase humalog to  6 units at breakfast if over 150, 4 units at lunch if over 150, and 6 units  at supper if over 150. Call me if you get readings under 150. BRING YOUR GLUCOMETER TO EACH VISIT WITH ME.  Keep your appt about your leg for next Tuesday, 2/2.   2. Uncontrolled type 2 diabetes mellitus with stage 3 chronic kidney disease, with long-term current use of insulin (HCC) -as above--dxs associated with insulin and cbg so could not delete duplicates - insulin lispro (HUMALOG KWIKPEN) 100 UNIT/ML KwikPen; 6 units after breakfast, 4 units after lunch and 6 units after supper; only for CBG > 150  Dispense: 6 pen; Refill: 5  3. PAD (peripheral artery disease) (HCC) - s/p amputation, healed, working on learning to walk with prosthesis - Lipid panel; Future  4. Essential hypertension, benign - bp well controlled on current meds w/o low bp or dizziness, cont same regimen including arb - Basic metabolic panel; Future  5. Status post below-knee amputation of right lower extremity ([place]) -well healed, cont therapy for ambulating with prosthesis -keep vascular f/us  6. Chronic diastolic (congestive) heart failure (HCC) - no signs of this, cont hctz as only diuretic (? Truly needs hctz except for bp--I'm concerned she is prone to dehydration b/c fluid intake is not impressive) - Basic metabolic panel; Future  Labs/tests ordered:   Lab Orders     Basic metabolic panel     Hemoglobin A1c     Lipid panel     POC Glucose (CBG)  Next appt:   4 wks with fasting labs before--did not make when left!  Laura Caldas L. Ryin Schillo, D.O. [facility] [address], [place] [address] Cell Phone (Mon-Fri 8am-5pm):  [phone] On Call:  [phone] & follow prompts after 5pm & weekends Office Phone:  [phone] Office Fax:  [phone]

## 2020-01-29 ENCOUNTER — Other Ambulatory Visit: Payer: Self-pay | Admitting: Internal Medicine

## 2020-01-29 NOTE — Telephone Encounter (Signed)
rx sent to pharmacy by e-script  

## 2020-02-04 DIAGNOSIS — I639 Cerebral infarction, unspecified: Secondary | ICD-10-CM | POA: Diagnosis not present

## 2020-02-04 DIAGNOSIS — I96 Gangrene, not elsewhere classified: Secondary | ICD-10-CM | POA: Diagnosis not present

## 2020-02-05 ENCOUNTER — Encounter: Payer: Self-pay | Admitting: Internal Medicine

## 2020-02-08 ENCOUNTER — Emergency Department (HOSPITAL_COMMUNITY): Payer: Medicare Other

## 2020-02-08 ENCOUNTER — Encounter (HOSPITAL_COMMUNITY): Payer: Self-pay | Admitting: Emergency Medicine

## 2020-02-08 ENCOUNTER — Inpatient Hospital Stay (HOSPITAL_COMMUNITY): Payer: Medicare Other

## 2020-02-08 ENCOUNTER — Inpatient Hospital Stay (HOSPITAL_COMMUNITY)
Admission: EM | Admit: 2020-02-08 | Discharge: 2020-02-14 | DRG: 177 | Disposition: A | Discharge status: Skilled Nursing Facility | Payer: Medicare Other | Attending: Internal Medicine | Admitting: Internal Medicine

## 2020-02-08 ENCOUNTER — Other Ambulatory Visit: Payer: Self-pay

## 2020-02-08 DIAGNOSIS — R29721 NIHSS score 21: Secondary | ICD-10-CM | POA: Diagnosis present

## 2020-02-08 DIAGNOSIS — Z89511 Acquired absence of right leg below knee: Secondary | ICD-10-CM | POA: Diagnosis not present

## 2020-02-08 DIAGNOSIS — I70209 Unspecified atherosclerosis of native arteries of extremities, unspecified extremity: Secondary | ICD-10-CM | POA: Diagnosis present

## 2020-02-08 DIAGNOSIS — G9341 Metabolic encephalopathy: Secondary | ICD-10-CM

## 2020-02-08 DIAGNOSIS — Z87891 Personal history of nicotine dependence: Secondary | ICD-10-CM

## 2020-02-08 DIAGNOSIS — N179 Acute kidney failure, unspecified: Secondary | ICD-10-CM | POA: Diagnosis present

## 2020-02-08 DIAGNOSIS — Z7902 Long term (current) use of antithrombotics/antiplatelets: Secondary | ICD-10-CM

## 2020-02-08 DIAGNOSIS — I129 Hypertensive chronic kidney disease with stage 1 through stage 4 chronic kidney disease, or unspecified chronic kidney disease: Secondary | ICD-10-CM | POA: Diagnosis not present

## 2020-02-08 DIAGNOSIS — F05 Delirium due to known physiological condition: Secondary | ICD-10-CM | POA: Diagnosis present

## 2020-02-08 DIAGNOSIS — E1110 Type 2 diabetes mellitus with ketoacidosis without coma: Secondary | ICD-10-CM | POA: Diagnosis present

## 2020-02-08 DIAGNOSIS — E871 Hypo-osmolality and hyponatremia: Secondary | ICD-10-CM | POA: Diagnosis present

## 2020-02-08 DIAGNOSIS — E785 Hyperlipidemia, unspecified: Secondary | ICD-10-CM | POA: Diagnosis present

## 2020-02-08 DIAGNOSIS — Z8673 Personal history of transient ischemic attack (TIA), and cerebral infarction without residual deficits: Secondary | ICD-10-CM | POA: Diagnosis not present

## 2020-02-08 DIAGNOSIS — Z7401 Bed confinement status: Secondary | ICD-10-CM | POA: Diagnosis not present

## 2020-02-08 DIAGNOSIS — I10 Essential (primary) hypertension: Secondary | ICD-10-CM | POA: Diagnosis not present

## 2020-02-08 DIAGNOSIS — E1122 Type 2 diabetes mellitus with diabetic chronic kidney disease: Secondary | ICD-10-CM | POA: Diagnosis not present

## 2020-02-08 DIAGNOSIS — R4182 Altered mental status, unspecified: Secondary | ICD-10-CM

## 2020-02-08 DIAGNOSIS — Z743 Need for continuous supervision: Secondary | ICD-10-CM | POA: Diagnosis not present

## 2020-02-08 DIAGNOSIS — R7989 Other specified abnormal findings of blood chemistry: Secondary | ICD-10-CM | POA: Diagnosis not present

## 2020-02-08 DIAGNOSIS — R32 Unspecified urinary incontinence: Secondary | ICD-10-CM | POA: Diagnosis present

## 2020-02-08 DIAGNOSIS — Z888 Allergy status to other drugs, medicaments and biological substances status: Secondary | ICD-10-CM

## 2020-02-08 DIAGNOSIS — Z0189 Encounter for other specified special examinations: Secondary | ICD-10-CM

## 2020-02-08 DIAGNOSIS — U071 COVID-19: Secondary | ICD-10-CM | POA: Diagnosis not present

## 2020-02-08 DIAGNOSIS — Z66 Do not resuscitate: Secondary | ICD-10-CM | POA: Diagnosis present

## 2020-02-08 DIAGNOSIS — Z833 Family history of diabetes mellitus: Secondary | ICD-10-CM

## 2020-02-08 DIAGNOSIS — E11649 Type 2 diabetes mellitus with hypoglycemia without coma: Secondary | ICD-10-CM | POA: Diagnosis not present

## 2020-02-08 DIAGNOSIS — N189 Chronic kidney disease, unspecified: Secondary | ICD-10-CM | POA: Diagnosis present

## 2020-02-08 DIAGNOSIS — E1129 Type 2 diabetes mellitus with other diabetic kidney complication: Secondary | ICD-10-CM | POA: Diagnosis not present

## 2020-02-08 DIAGNOSIS — N1831 Chronic kidney disease, stage 3a: Secondary | ICD-10-CM | POA: Diagnosis not present

## 2020-02-08 DIAGNOSIS — I671 Cerebral aneurysm, nonruptured: Secondary | ICD-10-CM | POA: Diagnosis present

## 2020-02-08 DIAGNOSIS — I739 Peripheral vascular disease, unspecified: Secondary | ICD-10-CM | POA: Diagnosis present

## 2020-02-08 DIAGNOSIS — M6281 Muscle weakness (generalized): Secondary | ICD-10-CM | POA: Diagnosis not present

## 2020-02-08 DIAGNOSIS — E1165 Type 2 diabetes mellitus with hyperglycemia: Secondary | ICD-10-CM | POA: Diagnosis not present

## 2020-02-08 DIAGNOSIS — R159 Full incontinence of feces: Secondary | ICD-10-CM | POA: Diagnosis present

## 2020-02-08 DIAGNOSIS — R55 Syncope and collapse: Secondary | ICD-10-CM | POA: Diagnosis not present

## 2020-02-08 DIAGNOSIS — Z9181 History of falling: Secondary | ICD-10-CM

## 2020-02-08 DIAGNOSIS — Z7982 Long term (current) use of aspirin: Secondary | ICD-10-CM

## 2020-02-08 DIAGNOSIS — D649 Anemia, unspecified: Secondary | ICD-10-CM | POA: Diagnosis not present

## 2020-02-08 DIAGNOSIS — R404 Transient alteration of awareness: Secondary | ICD-10-CM | POA: Diagnosis not present

## 2020-02-08 DIAGNOSIS — Z90710 Acquired absence of both cervix and uterus: Secondary | ICD-10-CM

## 2020-02-08 DIAGNOSIS — R29818 Other symptoms and signs involving the nervous system: Secondary | ICD-10-CM | POA: Diagnosis not present

## 2020-02-08 DIAGNOSIS — E876 Hypokalemia: Secondary | ICD-10-CM | POA: Diagnosis not present

## 2020-02-08 DIAGNOSIS — R4789 Other speech disturbances: Secondary | ICD-10-CM | POA: Diagnosis not present

## 2020-02-08 DIAGNOSIS — R531 Weakness: Secondary | ICD-10-CM | POA: Diagnosis not present

## 2020-02-08 DIAGNOSIS — R4701 Aphasia: Secondary | ICD-10-CM | POA: Diagnosis present

## 2020-02-08 DIAGNOSIS — Z20822 Contact with and (suspected) exposure to covid-19: Secondary | ICD-10-CM | POA: Diagnosis not present

## 2020-02-08 DIAGNOSIS — I6389 Other cerebral infarction: Secondary | ICD-10-CM | POA: Diagnosis not present

## 2020-02-08 DIAGNOSIS — I6611 Occlusion and stenosis of right anterior cerebral artery: Secondary | ICD-10-CM | POA: Diagnosis not present

## 2020-02-08 DIAGNOSIS — G4489 Other headache syndrome: Secondary | ICD-10-CM | POA: Diagnosis not present

## 2020-02-08 DIAGNOSIS — R569 Unspecified convulsions: Secondary | ICD-10-CM

## 2020-02-08 DIAGNOSIS — Z01818 Encounter for other preprocedural examination: Secondary | ICD-10-CM | POA: Diagnosis not present

## 2020-02-08 DIAGNOSIS — Z79899 Other long term (current) drug therapy: Secondary | ICD-10-CM

## 2020-02-08 DIAGNOSIS — Z794 Long term (current) use of insulin: Secondary | ICD-10-CM

## 2020-02-08 DIAGNOSIS — Z1389 Encounter for screening for other disorder: Secondary | ICD-10-CM

## 2020-02-08 DIAGNOSIS — G8384 Todd's paralysis (postepileptic): Secondary | ICD-10-CM | POA: Diagnosis present

## 2020-02-08 DIAGNOSIS — E1151 Type 2 diabetes mellitus with diabetic peripheral angiopathy without gangrene: Secondary | ICD-10-CM | POA: Diagnosis present

## 2020-02-08 DIAGNOSIS — I5030 Unspecified diastolic (congestive) heart failure: Secondary | ICD-10-CM | POA: Diagnosis not present

## 2020-02-08 DIAGNOSIS — R1311 Dysphagia, oral phase: Secondary | ICD-10-CM | POA: Diagnosis not present

## 2020-02-08 DIAGNOSIS — M2550 Pain in unspecified joint: Secondary | ICD-10-CM | POA: Diagnosis not present

## 2020-02-08 DIAGNOSIS — IMO0002 Reserved for concepts with insufficient information to code with codable children: Secondary | ICD-10-CM

## 2020-02-08 HISTORY — DX: Metabolic encephalopathy: G93.41

## 2020-02-08 LAB — PROTIME-INR
INR: 1 (ref 0.8–1.2)
Prothrombin Time: 12.9 seconds (ref 11.4–15.2)

## 2020-02-08 LAB — APTT: aPTT: 20 seconds — ABNORMAL LOW (ref 24–36)

## 2020-02-08 LAB — URINALYSIS, ROUTINE W REFLEX MICROSCOPIC
Bacteria, UA: NONE SEEN
Bilirubin Urine: NEGATIVE
Glucose, UA: >=500 mg/dL — AB
Ketones, ur: 20 mg/dL — AB
Leukocytes,Ua: NEGATIVE
Nitrite: NEGATIVE
Protein, ur: >=300 mg/dL — AB
Specific Gravity, Urine: 1.028 (ref 1.005–1.030)
pH: 7 (ref 5.0–8.0)

## 2020-02-08 LAB — CBC
HCT: 36.5 % (ref 36.0–46.0)
Hemoglobin: 12.5 g/dL (ref 12.0–15.0)
MCH: 30.9 pg (ref 26.0–34.0)
MCHC: 34.2 g/dL (ref 30.0–36.0)
MCV: 90.3 fL (ref 80.0–100.0)
Platelets: 309 10*3/uL (ref 150–400)
RBC: 4.04 MIL/uL (ref 3.87–5.11)
RDW: 12.4 % (ref 11.5–15.5)
WBC: 17.2 10*3/uL — ABNORMAL HIGH (ref 4.0–10.5)
nRBC: 0 % (ref 0.0–0.2)

## 2020-02-08 LAB — CBG MONITORING, ED
Glucose-Capillary: 437 mg/dL — ABNORMAL HIGH (ref 70–99)
Glucose-Capillary: 418 mg/dL — ABNORMAL HIGH (ref 70–99)
Glucose-Capillary: 387 mg/dL — ABNORMAL HIGH (ref 70–99)
Glucose-Capillary: 326 mg/dL — ABNORMAL HIGH (ref 70–99)
Glucose-Capillary: 309 mg/dL — ABNORMAL HIGH (ref 70–99)

## 2020-02-08 LAB — COMPREHENSIVE METABOLIC PANEL
ALT: 17 U/L (ref 0–44)
AST: 22 U/L (ref 15–41)
Albumin: 3.4 g/dL — ABNORMAL LOW (ref 3.5–5.0)
Alkaline Phosphatase: 69 U/L (ref 38–126)
Anion gap: 18 — ABNORMAL HIGH (ref 5–15)
BUN: 21 mg/dL (ref 8–23)
CO2: 21 mmol/L — ABNORMAL LOW (ref 22–32)
Calcium: 10.8 mg/dL — ABNORMAL HIGH (ref 8.9–10.3)
Chloride: 93 mmol/L — ABNORMAL LOW (ref 98–111)
Creatinine, Ser: 1.7 mg/dL — ABNORMAL HIGH (ref 0.44–1.00)
GFR calc Af Amer: 32 mL/min — ABNORMAL LOW (ref >60)
GFR calc non Af Amer: 28 mL/min — ABNORMAL LOW (ref >60)
Glucose, Bld: 443 mg/dL — ABNORMAL HIGH (ref 70–99)
Potassium: 4 mmol/L (ref 3.5–5.1)
Sodium: 132 mmol/L — ABNORMAL LOW (ref 135–145)
Total Bilirubin: 1.2 mg/dL (ref 0.3–1.2)
Total Protein: 7.2 g/dL (ref 6.5–8.1)

## 2020-02-08 LAB — DIFFERENTIAL
Abs Immature Granulocytes: 0.19 10*3/uL — ABNORMAL HIGH (ref 0.00–0.07)
Basophils Absolute: 0 10*3/uL (ref 0.0–0.1)
Basophils Relative: 0 %
Eosinophils Absolute: 0 10*3/uL (ref 0.0–0.5)
Eosinophils Relative: 0 %
Immature Granulocytes: 1 %
Lymphocytes Relative: 7 %
Lymphs Abs: 1.2 10*3/uL (ref 0.7–4.0)
Monocytes Absolute: 0.8 10*3/uL (ref 0.1–1.0)
Monocytes Relative: 5 %
Neutro Abs: 14.9 10*3/uL — ABNORMAL HIGH (ref 1.7–7.7)
Neutrophils Relative %: 87 %

## 2020-02-08 LAB — I-STAT CHEM 8, ED
BUN: 23 mg/dL (ref 8–23)
Calcium, Ion: 1.3 mmol/L (ref 1.15–1.40)
Chloride: 95 mmol/L — ABNORMAL LOW (ref 98–111)
Creatinine, Ser: 1.4 mg/dL — ABNORMAL HIGH (ref 0.44–1.00)
Glucose, Bld: 446 mg/dL — ABNORMAL HIGH (ref 70–99)
HCT: 38 % (ref 36.0–46.0)
Hemoglobin: 12.9 g/dL (ref 12.0–15.0)
Potassium: 4 mmol/L (ref 3.5–5.1)
Sodium: 131 mmol/L — ABNORMAL LOW (ref 135–145)
TCO2: 25 mmol/L (ref 22–32)

## 2020-02-08 LAB — PROCALCITONIN: Procalcitonin: 1.54 ng/mL

## 2020-02-08 LAB — GLUCOSE, CAPILLARY
Glucose-Capillary: 186 mg/dL — ABNORMAL HIGH (ref 70–99)
Glucose-Capillary: 221 mg/dL — ABNORMAL HIGH (ref 70–99)
Glucose-Capillary: 227 mg/dL — ABNORMAL HIGH (ref 70–99)

## 2020-02-08 LAB — FERRITIN: Ferritin: 1248 ng/mL — ABNORMAL HIGH (ref 11–307)

## 2020-02-08 LAB — FIBRINOGEN: Fibrinogen: 601 mg/dL — ABNORMAL HIGH (ref 210–475)

## 2020-02-08 LAB — LACTATE DEHYDROGENASE: LDH: 205 U/L — ABNORMAL HIGH (ref 98–192)

## 2020-02-08 LAB — RESPIRATORY PANEL BY RT PCR (FLU A&B, COVID)
Influenza A by PCR: NEGATIVE
Influenza B by PCR: NEGATIVE
SARS Coronavirus 2 by RT PCR: POSITIVE — AB

## 2020-02-08 LAB — D-DIMER, QUANTITATIVE: D-Dimer, Quant: 4.48 ug/mL-FEU — ABNORMAL HIGH (ref 0.00–0.50)

## 2020-02-08 LAB — C-REACTIVE PROTEIN: CRP: 1.5 mg/dL — ABNORMAL HIGH (ref <1.0)

## 2020-02-08 MED ORDER — SODIUM CHLORIDE 0.9 % IV SOLN: INTRAVENOUS | Status: DC

## 2020-02-08 MED ORDER — ACETAMINOPHEN 160 MG/5ML PO SOLN: 650.0000 mg | ORAL | Status: DC | PRN

## 2020-02-08 MED ORDER — SODIUM CHLORIDE 0.9% FLUSH
3.0000 mL | Freq: Once | INTRAVENOUS | Status: AC
Start: 2020-02-08 — End: 2020-02-08
  Administered 2020-02-08: 3 mL via INTRAVENOUS

## 2020-02-08 MED ORDER — ATORVASTATIN CALCIUM 10 MG PO TABS: 20.0000 mg | ORAL_TABLET | Freq: Every day | ORAL | Status: DC

## 2020-02-08 MED ORDER — LORAZEPAM 2 MG/ML IJ SOLN
2.0000 mg | Freq: Once | INTRAMUSCULAR | Status: AC
  Administered 2020-02-08: 1 mg via INTRAVENOUS
  Filled 2020-02-08: qty 1

## 2020-02-08 MED ORDER — DEXTROSE 50 % IV SOLN: 0.0000 mL | INTRAVENOUS | Status: DC | PRN

## 2020-02-08 MED ORDER — SODIUM CHLORIDE 0.9 % IV SOLN
100.0000 mg | Freq: Every day | INTRAVENOUS | Status: AC
  Administered 2020-02-09 – 2020-02-12 (×4): 100 mg via INTRAVENOUS
  Filled 2020-02-08 (×4): qty 20

## 2020-02-08 MED ORDER — ASPIRIN 81 MG PO CHEW
81.0000 mg | CHEWABLE_TABLET | Freq: Every day | ORAL | Status: DC
  Filled 2020-02-08: qty 1

## 2020-02-08 MED ORDER — IOHEXOL 350 MG/ML SOLN
50.0000 mL | Freq: Once | INTRAVENOUS | Status: AC | PRN
  Administered 2020-02-08: 50 mL via INTRAVENOUS

## 2020-02-08 MED ORDER — STROKE: EARLY STAGES OF RECOVERY BOOK
Freq: Once | Status: AC
  Filled 2020-02-08: qty 1

## 2020-02-08 MED ORDER — INSULIN GLARGINE 100 UNIT/ML ~~LOC~~ SOLN
20.0000 [IU] | Freq: Every day | SUBCUTANEOUS | Status: DC
  Administered 2020-02-08 – 2020-02-09 (×2): 20 [IU] via SUBCUTANEOUS
  Filled 2020-02-08 (×3): qty 0.2

## 2020-02-08 MED ORDER — SENNOSIDES-DOCUSATE SODIUM 8.6-50 MG PO TABS: 1.0000 | ORAL_TABLET | Freq: Every evening | ORAL | Status: DC | PRN

## 2020-02-08 MED ORDER — ENOXAPARIN SODIUM 30 MG/0.3ML ~~LOC~~ SOLN
30.0000 mg | SUBCUTANEOUS | Status: DC
  Administered 2020-02-08 – 2020-02-13 (×6): 30 mg via SUBCUTANEOUS
  Filled 2020-02-08 (×6): qty 0.3

## 2020-02-08 MED ORDER — INSULIN REGULAR(HUMAN) IN NACL 100-0.9 UT/100ML-% IV SOLN
INTRAVENOUS | Status: DC
  Administered 2020-02-08: 7.5 [IU]/h via INTRAVENOUS
  Filled 2020-02-08: qty 100

## 2020-02-08 MED ORDER — SODIUM CHLORIDE 0.9% FLUSH
3.0000 mL | Freq: Two times a day (BID) | INTRAVENOUS | Status: DC
  Administered 2020-02-08 – 2020-02-13 (×11): 3 mL via INTRAVENOUS

## 2020-02-08 MED ORDER — SODIUM CHLORIDE 0.9 % IV SOLN
200.0000 mg | Freq: Once | INTRAVENOUS | Status: AC
  Administered 2020-02-08: 200 mg via INTRAVENOUS
  Filled 2020-02-08: qty 200

## 2020-02-08 MED ORDER — INSULIN ASPART 100 UNIT/ML ~~LOC~~ SOLN
0.0000 [IU] | Freq: Three times a day (TID) | SUBCUTANEOUS | Status: DC
  Administered 2020-02-09: 5 [IU] via SUBCUTANEOUS

## 2020-02-08 MED ORDER — ACETAMINOPHEN 325 MG PO TABS: 650.0000 mg | ORAL_TABLET | ORAL | Status: DC | PRN

## 2020-02-08 MED ORDER — ACETAMINOPHEN 650 MG RE SUPP: 650.0000 mg | RECTAL | Status: DC | PRN

## 2020-02-08 MED ORDER — NICARDIPINE HCL IN NACL 20-0.86 MG/200ML-% IV SOLN
3.0000 mg/h | INTRAVENOUS | Status: DC
  Filled 2020-02-08: qty 200

## 2020-02-08 MED ORDER — INSULIN ASPART 100 UNIT/ML ~~LOC~~ SOLN
0.0000 [IU] | Freq: Every day | SUBCUTANEOUS | Status: DC
  Administered 2020-02-08: 2 [IU] via SUBCUTANEOUS

## 2020-02-08 MED ORDER — CLOPIDOGREL BISULFATE 75 MG PO TABS
75.0000 mg | ORAL_TABLET | Freq: Every day | ORAL | Status: DC
  Administered 2020-02-10 – 2020-02-14 (×5): 75 mg via ORAL
  Filled 2020-02-08 (×6): qty 1

## 2020-02-08 MED ORDER — LORAZEPAM 2 MG/ML IJ SOLN
1.0000 mg | Freq: Once | INTRAMUSCULAR | Status: AC
  Administered 2020-02-10: 1 mg via INTRAVENOUS
  Filled 2020-02-08: qty 1

## 2020-02-08 MED ORDER — ONDANSETRON HCL 4 MG/2ML IJ SOLN: 4.0000 mg | Freq: Four times a day (QID) | INTRAMUSCULAR | Status: DC | PRN

## 2020-02-08 MED ORDER — ATORVASTATIN CALCIUM 40 MG PO TABS
40.0000 mg | ORAL_TABLET | Freq: Every day | ORAL | Status: DC
  Administered 2020-02-10: 40 mg via ORAL
  Filled 2020-02-08: qty 1

## 2020-02-08 MED ORDER — DEXTROSE-NACL 5-0.45 % IV SOLN: INTRAVENOUS | Status: DC

## 2020-02-08 NOTE — Consult Note (Addendum)
NEURO HOSPITALIST  CONSULT   Requesting Physician: Dr. Sabra Heck    Chief Complaint: unresponsive right side weakness  History obtained from:  Chart review/ son  HPI:                                                                                                                                         Monique Cabrera is an 85 y.o. female  With [place] Stroke (2018), DM 2, CKD 2, HTN who presented to [facility] ED as a code stroke for right side weakness, and unresponsiveness.    Per son who found her:  Patient was last talking/ communicating with him normally on Wednesday. She was able to communicate and refuse EMS on Thursday, but him and his sister both felt that she was not her normal self. He reports that since Wednesday she has been lying around in bed in the fetal position. She also ate 1 meal that day and maybe had a protein shake on Thursday. She was c/o of a HA yesterday which is why EMS was called but she refused transport to the hospital.   He last saw her at 2030 2/11. This morning at about 0900 he went and found the patient not talking and on the floor. EMS was called. At baseline patient's son does live with her " most days", but patient can maneuver around the house in her wheelchair. She is able to get herself in and out of the wheel chair, she cooks, and able to toilet herself.  On Sunday patient fell and hit her head on the night stand. According to EMS patient is only taking ASA. She had a BKA on the right knee in 2020. Patient was found to have been incontinent of bowel and bladder. ED course:  CTH: no hemorrhage CTA: no LVO BG: 424 BP: 140/78  01/2017:  Punctate acute infarct left para median along the upper floor of the fourth ventricle  Date last known well:02/06/11 Time last known well:2030 ? Acting confused for 1- 2 days tPA Given: no; outside of window Modified Rankin: Rankin Score=2 NIHSS:21  Past Medical History:  Diagnosis  Date  . Acute upper respiratory infections of unspecified site   . Amputee 08/2019  . Anemia   . Anemia, unspecified   . Atherosclerosis of native arteries of the extremities, unspecified   . Chest pain, unspecified   . Chronic kidney disease (CKD), stage II (mild)   . Diabetes mellitus   . Diarrhea   . Disorder of bone and cartilage, unspecified   . DM (diabetes mellitus) type II controlled with renal manifestation ([place])   . Herpes zoster  with other nervous system complications(053.19)   . Hypercalcemia   . Hypertension   . Hypertensive renal disease, benign   . Nonspecific reaction to tuberculin skin test without active tuberculosis(795.51)   . Nonspecific tuberculin test reaction   . Other and unspecified hyperlipidemia   . Pain in joint, lower leg   . Peripheral arterial disease ([place])   . Postherpetic neuralgia   . Proteinuria   . Stroke ([place]) 01/2017  . Type II or unspecified type diabetes mellitus with renal manifestations, not stated as uncontrolled(250.40)   . Type II or unspecified type diabetes mellitus with renal manifestations, uncontrolled(250.42)   . Unspecified disorder of kidney and ureter   . Unspecified essential hypertension     Past Surgical History:  Procedure Laterality Date  . ABDOMINAL AORTOGRAM W/LOWER EXTREMITY N/A 10/31/2019   Procedure: ABDOMINAL AORTOGRAM W/LOWER EXTREMITY;  Surgeon: Wellington Hampshire, MD;  Location: [facility];  Service: Cardiovascular;  Laterality: N/A;  . AMPUTATION Right 11/02/2019   Procedure: AMPUTATION BELOW KNEE RIGHT;  Surgeon: Rosetta Posner, MD;  Location: [facility];  Service: Vascular;  Laterality: Right;  . hysterectomy    . INCISION AND DRAINAGE Left 05/27/14   sebacous cyst, ear  . PRP Left    Dr. Ricki Miller  . removal of cyst from hand    . removal of tumor from foot    . TONSILLECTOMY      Family History  Problem Relation Age of Onset  . Diabetes Mother   . Diabetes Father   . Diabetes Sister   . Diabetes  Sister         Social History:  reports that she has quit smoking. Her smoking use included cigarettes. She has never used smokeless tobacco. She reports that she does not drink alcohol or use drugs.  Allergies:  Allergies  Allergen Reactions  . Invokana [Canagliflozin] Itching, Swelling and Other (See Comments)    Vaginal itching, swelling and irritation  . Jardiance [Empagliflozin] Itching, Swelling and Other (See Comments)    Vaginal itching and swelling    Medications:                                                                                                                          Current Facility-Administered Medications  Medication Dose Route Frequency Provider Last Rate Last Admin  . sodium chloride flush (NS) 0.9 % injection 3 mL  3 mL Intravenous Once Noemi Chapel, MD       Current Outpatient Medications  Medication Sig Dispense Refill  . amLODipine (NORVASC) 10 MG tablet TAKE 1 TABLET (10 MG TOTAL) BY MOUTH DAILY. FOR HIGH BLOOD PRESSURE 90 tablet 1  . aspirin 81 MG chewable tablet Chew 1 tablet (81 mg total) by mouth daily. 90 tablet 3  . atorvastatin (LIPITOR) 20 MG tablet TAKE 1 TABLET BY MOUTH EVERY DAY 90 tablet 1  . carvedilol (COREG) 25 MG tablet TAKE 1 TABLET BY MOUTH TWICE A DAY WITH  MEALS 180 tablet 1  . clopidogrel (PLAVIX) 75 MG tablet TAKE 1 TABLET BY MOUTH EVERY DAY 90 tablet 1  . glucose 4 GM chewable tablet Chew 1 tablet (4 g total) by mouth as needed for low blood sugar. 50 tablet 12  . insulin glargine (LANTUS) 100 UNIT/ML injection Inject 0.2 mLs (20 Units total) into the skin daily. 10 mL 11  . insulin lispro (HUMALOG KWIKPEN) 100 UNIT/ML KwikPen 6 units after breakfast, 4 units after lunch and 6 units after supper; only for CBG > 150 6 pen 5  . Insulin Pen Needle (B-D ULTRAFINE III SHORT PEN) 31G X 8 MM MISC Use to check blood sugar every day. Dx: 11.29; 11.65 100 each 1  . ONETOUCH VERIO test strip USE TO TEST BLOOD SUGAR THREE TIMES DAILY. DX:  E11.9 300 strip 3  . valsartan-hydrochlorothiazide (DIOVAN-HCT) 320-25 MG tablet Take 1 tablet by mouth daily.       ROS:                                                                                                                                        unobtainable from patient due to mental status  General Examination:                                                                                                      There were no vitals taken for this visit.  Physical Exam  Constitutional: Appears well-developed and well-nourished.  Psych: Affect appropriate to situation Eyes: Normal external eye and conjunctiva. HENT: Normocephalic, no lesions, without obvious abnormality.   Musculoskeletal- right BKA Cardiovascular: Normal rate and regular rhythm.  Respiratory: Effort normal, non-labored breathing saturations WNL GI: Soft.  No distension. There is no tenderness.  Skin: WDI  Neurological Examination Mental Status: Alert, non verbal, not oriented.  Not following commands Cranial Nerves: II: legally blind in left eye, but blinks to threat from left. Does not blink to threat from right.  PERRL, gaze preference to left, does not cross midline to right. Motor/ sensory: Moves all extremities spontaneously, but not to command. Localizes and withdraws to noxious stimuli. Tone and bulk: increased tone and tremor/ Twitching in RUE. Plantars: Right BKA   Left: downgoing UTA Gait: deferred   Lab Results: Basic Metabolic Panel: Recent Labs  Lab 02/08/20 0953  NA 131*  K 4.0  CL 95*  GLUCOSE 446*  BUN 23  CREATININE 1.40*    CBC: Recent Labs  Lab 02/08/20  0950 02/08/20 0953  WBC 17.2*  --   NEUTROABS 14.9*  --   HGB 12.5 12.9  HCT 36.5 38.0  MCV 90.3  --   PLT 309  --     CBG: Recent Labs  Lab 02/08/20 0944  GLUCAP 437*    Imaging: CT HEAD CODE STROKE WO CONTRAST  Result Date: 02/08/2020 CLINICAL DATA:  Code stroke. Neuro deficit, acute, stroke  suspected. EXAM: CT HEAD WITHOUT CONTRAST TECHNIQUE: Contiguous axial images were obtained from the base of the skull through the vertex without intravenous contrast. COMPARISON:  None. FINDINGS: Brain: No acute infarct, hemorrhage, or mass lesion is present. Moderate generalized atrophy and white matter disease is stable. The ventricles are proportionate to the degree of atrophy. Basal ganglia and insular cortex is bilaterally. No acute or focal cortical abnormality is present. No significant extraaxial fluid collection is present. The brainstem and cerebellum are within normal limits. Vascular: Extensive vascular calcifications extend to the MCA bifurcations bilaterally without significant interval change. No other asymmetric hyperdense vessel is present. Dense calcifications in the basilar artery vertebral arteries are also stable. Skull: Insert normal skull No significant extracranial soft tissue lesion is present. Sinuses/Orbits: The paranasal sinuses and mastoid air cells are clear. Bilateral lens replacements are noted. Globes and orbits are otherwise unremarkable. Other: ASPECTS ([place] Stroke Program Early CT Score) - Ganglionic level infarction (caudate, lentiform nuclei, internal capsule, insula, M1-M3 cortex): 3/3 - Supraganglionic infarction (M4-M6 cortex): 7/7 Total score (0-10 with 10 being normal): 10/10 IMPRESSION: 1. No acute intracranial abnormality or significant interval change. 2. Stable advanced atrophy and diffuse white matter disease. 3. Advanced calcific atherosclerotic changes extend to the MCA bifurcations and the basilar artery are stable. 4. ASPECTS is 10/10 1. The above was relayed via text pager to Dr. Lorraine Lax on 02/08/2020 at 10:03 . Electronically Signed   By: San Morelle M.D.   On: 02/08/2020 10:04       Laurey Morale, MSN, NP-C [facility] [phone]  02/08/2020, 9:53 AM   Attending physician note to follow with Assessment and plan  .   Assessment: 85 y.o. female With [place] Stroke (2018), DM 2, CKD 2, HTN, Right BKA (2020) who presented to [facility] ED as a code stroke for unresponsiveness and right side weakness. CTH was negative for hemorrhage. Patient was not a candidate for TPA d/t presenting outside of the window. Not a candidate for IR d/t CTA was negative for LVO.  Given presentation and history will obtain an STAT EEG to r.o seizure. Stroke Risk Factors - diabetes mellitus, hyperlipidemia and hypertension    Recommendations: -- BP goal : Permissive HTN upto 220/120 mmHg  --MRI brain WO --telemetry -- continue ASA -- Echo --PT/OT/  --bedside swallow --STAT EEG    --please page stroke NP  Or  PA  Or MD from 8am -4 pm  as this patient from this time will be  followed by the stroke.   You can look them up on [email or web address]  Password [email or web address]    NEUROHOSPITALIST ADDENDUM Performed a face to face diagnostic evaluation.   I have reviewed the contents of history and physical exam as documented by PA/ARNP/Resident and agree with above documentation.  I have discussed and formulated the above plan as documented. Edits to the note have been made as needed.  85 year old female with past medical history of stroke in 2018,  intracranial atherosclerotic disease with high-grade stenosis of the left anterior M2 segment, diabetes mellitus, CKD, hypertension, peripheral vascular disease, blind in  the left eye, right below the knee amputation presents with aphasia and right-sided weakness.  NIH stroke scale on arrival was 21. CT head was done, aspects was 10, no acute findings.  CT angiogram showed no large vessel occlusion (there was significant motion, however radiologist no circle of Willis was visualized well enough for interpretation and did not feel a repeat CT Angio was necessary).  Given patient's profound findings, stat EEG was performed to make sure patient was not in nonconvulsive status epilepticus.  EEG was negative for  seizures or epileptiform discharges.    Impression  #Aphasia and right hemiplegia:  likely due to cerebral hypoperfusion given history of intracranial atherosclerotic disease and left MCA M2 stenosis vs suspected ischemic stroke. - Unfortunately not a candidate for IV TPA as that she is outside the window and not an IR candidate as no LVO.  -Recommend permissive hypertension up to 532 systolic -Continue home aspirin Plavix as well as high-dose statin as well further stroke workup as detailed above  #Acute metabolic encephalopathy from COVID-19 infection     Mihail Prettyman MD [facility] [phone]   If 7pm to 7am, please call on call as listed on AMION.

## 2020-02-08 NOTE — Progress Notes (Signed)
Patient non verbal at this time. Will not follow commands at this time. Unable to assess swallowing.

## 2020-02-08 NOTE — Progress Notes (Signed)
Patient is non-verbal at this time. Does not appear to be in pain.

## 2020-02-08 NOTE — ED Provider Notes (Signed)
Medical screening examination/treatment/procedure(s) were conducted as a shared visit with non-physician practitioner(s) and myself.  I personally evaluated the patient during the encounter.  Clinical Impression:   Final diagnoses:  Altered mental status, unspecified altered mental status type    This patient is a 85 year old female who has had a prior right lower extremity amputation, she is a diabetic, paramedics know this patient from prior visits as she frequently has hypoglycemia and refuses transport after getting D50.  She actually refused transport yesterday after calling out for headache.  She evidently fell couple of days ago striking her head on the bedside table.  This morning she was found by family to have a leftward gaze, decreased level of responsiveness, inability to follow commands.  CBG was over 400.  On my exam she does not fact have right-sided partial hemiparesis, left side with difficulty following commands, leftward gaze, speech is unintelligible.  Code stroke activated.; I saw the patient on arrival at the bridge and evaluated with Neurology.  The pt is critically ill - can't speak - level 5 caveat applies  .Critical Care Performed by: Noemi Chapel, MD Authorized by: Noemi Chapel, MD   Critical care provider statement:    Critical care time (minutes):  35   Critical care time was exclusive of:  Separately billable procedures and treating other patients and teaching time   Critical care was necessary to treat or prevent imminent or life-threatening deterioration of the following conditions:  CNS failure or compromise   Critical care was time spent personally by me on the following activities:  Blood draw for specimens, development of treatment plan with patient or surrogate, discussions with consultants, evaluation of patient's response to treatment, examination of patient, obtaining history from patient or surrogate, ordering and performing treatments and interventions,  ordering and review of laboratory studies, ordering and review of radiographic studies, pulse oximetry, re-evaluation of patient's condition and review of old charts   8:58 AM   EKG Interpretation  Date/Time:  Friday February 08 2020 10:25:31 EST Ventricular Rate:  103 PR Interval:    QRS Duration: 90 QT Interval:  399 QTC Calculation: 523 R Axis:   -33 Text Interpretation: Sinus tachycardia Prolonged QT interval Artifact in lead(s) I II III aVR aVL aVF V1 V2 Confirmed by [name] [phone]) on 02/10/2020 7:50:40 AM         Noemi Chapel, MD 02/12/20 [phone]

## 2020-02-08 NOTE — ED Notes (Signed)
  Pt has not been awake enough to be given the swallow test safely.

## 2020-02-08 NOTE — Progress Notes (Signed)
Patient is non verbal at this time. Unable to answer questions.

## 2020-02-08 NOTE — Code Documentation (Signed)
85 yo female with past medical hx of HTN, DM, and right BKA coming from home with complaints of new onset of right sided weakness and left gaze. Pt was noted to have a fall on Sunday where she hit her right temple. Family reports pt has been complaining of headache since then worsening yesterday. She was noted to be abnormal since Wednesday, but was talking and speaking with family. EMS was called yesterday and patient refused transport. LKW at 0830 last night per family. This morning at 0900, family came to check on her and found her down in the floor unable to use her right arm, gazing to the left, and mumbling. EMS called and activated a Code Stroke. Stroke Team met patient upon arrival to the ED. Labs drawn and EDP cleared airway. Pt taken directly to CT. NIHSS 21 - See Stroke Timeline for details. Not a tPA candidate due to being outside window. CTA completed - No LVO noted per radiology. Delay to the patient not lying still well. Pt brought back to ED. Placed on cardiac monitor. Not an IR candidate. Handoff given to Mitzi Hansen, Therapist, sports.

## 2020-02-08 NOTE — Progress Notes (Signed)
Patient non verbal at this time. Unable to assess.

## 2020-02-08 NOTE — Procedures (Signed)
Patient Name: Monique Cabrera  MRN: [id number]  Epilepsy Attending: Lora Havens  Referring Physician/Provider: Laurey Morale, NP Date: 02/08/2020 Duration: 36.20 minutes  Patient history: 85 year old female with prior medical history of stroke who presented with unresponsiveness and right-sided weakness.  CT head negative for hemorrhage.  EEG evaluate for seizures.  Level of alertness: Lethargic  AEDs during EEG study: None  Technical aspects: This EEG study was done with scalp electrodes positioned according to the 10-20 International system of electrode placement. Electrical activity was acquired at a sampling rate of 500Hz  and reviewed with a high frequency filter of 70Hz  and a low frequency filter of 1Hz . EEG data were recorded continuously and digitally stored.   Description: EEG showed continuous generalized low amplitude 2 to 3 Hz delta slowing as well as 13 to 15 Hz frontocentral beta activity.  Hyperventilation and photic stimulation were not performed.  Of note in the beginning, study was technically difficult due to significant myogenic artifact.  Abnormality -Continuous slow, generalized  IMPRESSION: This study is suggestive of moderate to severe diffuse encephalopathy, nonspecific etiology. No seizures or epileptiform discharges were seen throughout the recording.    Monique Cabrera Monique Cabrera

## 2020-02-08 NOTE — Progress Notes (Signed)
EEG complete - results pending 

## 2020-02-08 NOTE — ED Triage Notes (Signed)
   Patient BIB EMS for AMS and possible stroke.  Patient [place] 2030 last night.  Patient unable to follow commands. Hx HTN, DM, R BKA.  CBG on arrival 434.  Patient taken to CT 2 for CODE stroke.  Dr. Lorraine Lax and stroke RNs at bedside.

## 2020-02-08 NOTE — H&P (Signed)
History and Physical    Monique Cabrera IRW:[id number] DOB: Sep 11, 1935 DOA: 02/08/2020  PCP: [name], DO Consultants:  [facility] - vascular surgery; [facility] - cardiology Patient coming from:  Home - lives alone; NOK: Daughter, Doyle Askew, [phone]; Marion Downer, [phone]  Chief Complaint: Code stroke  HPI: Monique Cabrera is a 85 y.o. female with medical history significant of HTN; DM; CVA (2018); PAD s/p R BKA (10/2019); HLD; HTN; and stage 2 CKD presenting with decreased LOC and mumbling speech, concerning for CVA.  She is obtunded and essentially unresponsive and so unable to answer questions.  I was unable to reach either of her daughters.   ED Course:  AMS - EMS knows her well, usually A&O x 4.  EMS called for fall yesterday, declined transport.  AMS today, concern for stroke.  Neuro recommends EEG and if negative then MRI.  Review of Systems:  Unable to perform  Ambulatory Status:  wheelchair  Past Medical History:  Diagnosis Date  . Acute upper respiratory infections of unspecified site   . Amputee 08/2019  . Anemia   . Anemia, unspecified   . Atherosclerosis of native arteries of the extremities, unspecified   . Chest pain, unspecified   . Chronic kidney disease (CKD), stage II (mild)   . Diarrhea   . Disorder of bone and cartilage, unspecified   . Herpes zoster with other nervous system complications(053.19)   . Hypercalcemia   . Hypertension   . Hypertensive renal disease, benign   . Nonspecific reaction to tuberculin skin test without active tuberculosis(795.51)   . Other and unspecified hyperlipidemia   . Pain in joint, lower leg   . Peripheral arterial disease ([place])   . Postherpetic neuralgia   . Proteinuria   . Stroke ([place]) 01/2017  . Type II or unspecified type diabetes mellitus with renal manifestations, uncontrolled(250.42)   . Unspecified disorder of kidney and ureter     Past Surgical History:  Procedure Laterality Date  . ABDOMINAL  AORTOGRAM W/LOWER EXTREMITY N/A 10/31/2019   Procedure: ABDOMINAL AORTOGRAM W/LOWER EXTREMITY;  Surgeon: Wellington Hampshire, MD;  Location: [facility];  Service: Cardiovascular;  Laterality: N/A;  . AMPUTATION Right 11/02/2019   Procedure: AMPUTATION BELOW KNEE RIGHT;  Surgeon: Rosetta Posner, MD;  Location: [facility];  Service: Vascular;  Laterality: Right;  . hysterectomy    . INCISION AND DRAINAGE Left 05/27/14   sebacous cyst, ear  . PRP Left    Dr. Ricki Miller  . removal of cyst from hand    . removal of tumor from foot    . TONSILLECTOMY      Social History   Socioeconomic History  . Marital status: Widowed    Spouse name: Not on file  . Number of children: 6  . Years of education: 50  . Highest education level: Not on file  Occupational History    Comment: retired, [employer]  Tobacco Use  . Smoking status: Former Smoker    Types: Cigarettes  . Smokeless tobacco: Never Used  . Tobacco comment: Quit about age 35   Substance and Sexual Activity  . Alcohol use: No    Alcohol/week: 0.0 standard drinks  . Drug use: No  . Sexual activity: Never  Other Topics Concern  . Not on file  Social History Narrative   Lives alone, son there occass   Caffeine- coffee 2-3 cups daily, soda off and on   Social Determinants of Health   Financial Resource Strain:   .  Difficulty of Paying Living Expenses: Not on file  Food Insecurity:   . Worried About Charity fundraiser in the Last Year: Not on file  . Ran Out of Food in the Last Year: Not on file  Transportation Needs:   . Lack of Transportation (Medical): Not on file  . Lack of Transportation (Non-Medical): Not on file  Physical Activity:   . Days of Exercise per Week: Not on file  . Minutes of Exercise per Session: Not on file  Stress:   . Feeling of Stress : Not on file  Social Connections:   . Frequency of Communication with Friends and Family: Not on file  . Frequency of Social Gatherings with Friends and Family: Not on file    . Attends Religious Services: Not on file  . Active Member of Clubs or Organizations: Not on file  . Attends Archivist Meetings: Not on file  . Marital Status: Not on file  Intimate Partner Violence:   . Fear of Current or Ex-Partner: Not on file  . Emotionally Abused: Not on file  . Physically Abused: Not on file  . Sexually Abused: Not on file    Allergies  Allergen Reactions  . Invokana [Canagliflozin] Itching, Swelling and Other (See Comments)    Vaginal itching, swelling and irritation  . Jardiance [Empagliflozin] Itching, Swelling and Other (See Comments)    Vaginal itching and swelling    Family History  Problem Relation Age of Onset  . Diabetes Mother   . Diabetes Father   . Diabetes Sister   . Diabetes Sister     Prior to Admission medications   Medication Sig Start Date End Date Taking? Authorizing Provider  amLODipine (NORVASC) 10 MG tablet TAKE 1 TABLET (10 MG TOTAL) BY MOUTH DAILY. FOR HIGH BLOOD PRESSURE 01/29/20   Reed, Tiffany L, DO  aspirin 81 MG chewable tablet Chew 1 tablet (81 mg total) by mouth daily. 11/15/19   Reed, Tiffany L, DO  atorvastatin (LIPITOR) 20 MG tablet TAKE 1 TABLET BY MOUTH EVERY DAY 06/11/19   Despina Hick, MD  carvedilol (COREG) 25 MG tablet TAKE 1 TABLET BY MOUTH TWICE A DAY WITH MEALS 12/31/19   Reed, Tiffany L, DO  clopidogrel (PLAVIX) 75 MG tablet TAKE 1 TABLET BY MOUTH EVERY DAY 10/25/19   Reed, Tiffany L, DO  glucose 4 GM chewable tablet Chew 1 tablet (4 g total) by mouth as needed for low blood sugar. 11/15/19   Reed, Tiffany L, DO  insulin glargine (LANTUS) 100 UNIT/ML injection Inject 0.2 mLs (20 Units total) into the skin daily. 12/13/19   Reed, Tiffany L, DO  insulin lispro (HUMALOG KWIKPEN) 100 UNIT/ML KwikPen 6 units after breakfast, 4 units after lunch and 6 units after supper; only for CBG > 150 01/24/20   Reed, Tiffany L, DO  Insulin Pen Needle (B-D ULTRAFINE III SHORT PEN) 31G X 8 MM MISC Use to check blood sugar  every day. Dx: 11.29; 11.65 07/02/19   Reed, Tiffany L, DO  ONETOUCH VERIO test strip USE TO TEST BLOOD SUGAR THREE TIMES DAILY. DX: E11.9 10/08/19   Reed, Tiffany L, DO  valsartan-hydrochlorothiazide (DIOVAN-HCT) 320-25 MG tablet Take 1 tablet by mouth daily.    [provider]    Physical Exam: Vitals:   02/08/20 1530 02/08/20 1545 02/08/20 1600 02/08/20 1702  BP: (!) 170/76 (!) 193/86 (!) 155/75   Pulse: (!) 101 (!) 109 100   Resp: (!) 21 (!) 28 19  Temp:    98.1 F (36.7 C)  TempSrc:    Axillary  SpO2: 97% 97% 97%   Weight:      Height:         . General:  Appears calm and comfortable and is NAD; lying on her left side, curled up and unresponsive . Eyes:  Normal lids . ENT:  Normal lips  . Neck:  no LAD, masses or thyromegaly; no carotid bruits . Cardiovascular:  RR with mild tachycardia, no m/r/g. No LE edema.  Marland Kitchen Respiratory:   CTA bilaterally with no wheezes/rales/rhonchi.  Normal respiratory effort. . Abdomen:  soft, NT, ND, NABS . Skin:  no rash or induration seen on limited exam . Musculoskeletal:   no bony abnormality; appears to spontaneous move left arm and left leg at least; s/p R BKA . Psychiatric:  Obtunded, minimal effort to respond even on command . Neurologic:  Unable to perform    Radiological Exams on Admission: CT ANGIO HEAD W OR WO CONTRAST  Addendum Date: 02/08/2020   ADDENDUM REPORT: 02/08/2020 14:54 ADDENDUM: A 2 mm left MCA bifurcation aneurysm is stable from the prior exam. This is noted on further inquiry for explanation of right-sided symptoms. This is unlikely to be related to the patient's symptoms. Electronically Signed   By: San Morelle M.D.   On: 02/08/2020 14:54   Result Date: 02/08/2020 CLINICAL DATA:  Right-sided weakness. EXAM: CT ANGIOGRAPHY HEAD AND NECK TECHNIQUE: Multidetector CT imaging of the head and neck was performed using the standard protocol during bolus administration of intravenous contrast. Multiplanar CT  image reconstructions and MIPs were obtained to evaluate the vascular anatomy. Carotid stenosis measurements (when applicable) are obtained utilizing NASCET criteria, using the distal internal carotid diameter as the denominator. CONTRAST:  35mL OMNIPAQUE IOHEXOL 350 MG/ML SOLN COMPARISON:  None. FINDINGS: CTA NECK FINDINGS Aortic arch: Atherosclerotic calcifications are present within the aortic arch and at the origins the great vessels without significant stenosis or change. There is no aneurysm. Right carotid system: Right common carotid artery is within normal limits. Bifurcation is somewhat obscured by patient motion. No significant stenosis is present. Mild tortuosity is present in the cervical right ICA without significant stenosis. Left carotid system: Left common carotid artery is tortuous. Atherosclerotic changes are present at the bifurcation without significant stenosis. There is mild tortuosity of the cervical left ICA without significant stenosis. Vertebral arteries: The left vertebral artery is the dominant vessel. Both vertebral arteries originate from the subclavian arteries without significant stenosis. There is no definite stenosis in the neck. Skeleton: Degenerative changes are present lower cervical spine. Osseous structures are somewhat distorted by patient motion. No acute abnormalities are present. Other neck: The soft tissues the neck are otherwise unremarkable. Upper chest: Centrilobular emphysematous changes are present. No nodule or mass lesion is present. Thoracic inlet is normal. Review of the MIP images confirms the above findings CTA HEAD FINDINGS Anterior circulation: Atherosclerotic changes are present within the cavernous internal carotid arteries bilaterally without significant stenosis. Mild narrowing is present in the proximal right A1 segment. No emergent large vessel occlusion is present. There is some attenuation of distal small vessels. Posterior circulation: The left  vertebral artery is the dominant vessel. No significant stenosis is present. There is mild narrowing the mid basilar artery. Both posterior cerebral arteries originate from basilar tip. Distal vessel attenuation is present without a significant proximal stenosis or occlusion. Venous sinuses: The dural sinuses patent. The straight sinus and deep cerebral veins are intact. Cortical  veins are distorted by patient motion. Anatomic variants: None Review of the MIP images confirms the above findings IMPRESSION: 1. No emergent large vessel occlusion. 2. Atherosclerotic changes at the aortic arch, left carotid bifurcation, and cavernous internal carotid arteries bilaterally without significant stenosis. 3. Mild narrowing of the proximal right A1 segment. 4. Distal small vessel attenuation without a significant proximal stenosis, aneurysm, or branch vessel occlusion within the Circle of Willis. These results were called by telephone at the time of interpretation on 02/08/2020 at 10:05am to provider Dr. Lorraine Lax, Who verbally acknowledged these results. Electronically Signed: By: San Morelle M.D. On: 02/08/2020 10:34   CT Angio Neck W and/or Wo Contrast  Addendum Date: 02/08/2020   ADDENDUM REPORT: 02/08/2020 14:54 ADDENDUM: A 2 mm left MCA bifurcation aneurysm is stable from the prior exam. This is noted on further inquiry for explanation of right-sided symptoms. This is unlikely to be related to the patient's symptoms. Electronically Signed   By: San Morelle M.D.   On: 02/08/2020 14:54   Result Date: 02/08/2020 CLINICAL DATA:  Right-sided weakness. EXAM: CT ANGIOGRAPHY HEAD AND NECK TECHNIQUE: Multidetector CT imaging of the head and neck was performed using the standard protocol during bolus administration of intravenous contrast. Multiplanar CT image reconstructions and MIPs were obtained to evaluate the vascular anatomy. Carotid stenosis measurements (when applicable) are obtained utilizing NASCET  criteria, using the distal internal carotid diameter as the denominator. CONTRAST:  61mL OMNIPAQUE IOHEXOL 350 MG/ML SOLN COMPARISON:  None. FINDINGS: CTA NECK FINDINGS Aortic arch: Atherosclerotic calcifications are present within the aortic arch and at the origins the great vessels without significant stenosis or change. There is no aneurysm. Right carotid system: Right common carotid artery is within normal limits. Bifurcation is somewhat obscured by patient motion. No significant stenosis is present. Mild tortuosity is present in the cervical right ICA without significant stenosis. Left carotid system: Left common carotid artery is tortuous. Atherosclerotic changes are present at the bifurcation without significant stenosis. There is mild tortuosity of the cervical left ICA without significant stenosis. Vertebral arteries: The left vertebral artery is the dominant vessel. Both vertebral arteries originate from the subclavian arteries without significant stenosis. There is no definite stenosis in the neck. Skeleton: Degenerative changes are present lower cervical spine. Osseous structures are somewhat distorted by patient motion. No acute abnormalities are present. Other neck: The soft tissues the neck are otherwise unremarkable. Upper chest: Centrilobular emphysematous changes are present. No nodule or mass lesion is present. Thoracic inlet is normal. Review of the MIP images confirms the above findings CTA HEAD FINDINGS Anterior circulation: Atherosclerotic changes are present within the cavernous internal carotid arteries bilaterally without significant stenosis. Mild narrowing is present in the proximal right A1 segment. No emergent large vessel occlusion is present. There is some attenuation of distal small vessels. Posterior circulation: The left vertebral artery is the dominant vessel. No significant stenosis is present. There is mild narrowing the mid basilar artery. Both posterior cerebral arteries  originate from basilar tip. Distal vessel attenuation is present without a significant proximal stenosis or occlusion. Venous sinuses: The dural sinuses patent. The straight sinus and deep cerebral veins are intact. Cortical veins are distorted by patient motion. Anatomic variants: None Review of the MIP images confirms the above findings IMPRESSION: 1. No emergent large vessel occlusion. 2. Atherosclerotic changes at the aortic arch, left carotid bifurcation, and cavernous internal carotid arteries bilaterally without significant stenosis. 3. Mild narrowing of the proximal right A1 segment. 4. Distal small vessel  attenuation without a significant proximal stenosis, aneurysm, or branch vessel occlusion within the Circle of Willis. These results were called by telephone at the time of interpretation on 02/08/2020 at 10:05am to provider Dr. Lorraine Lax, Who verbally acknowledged these results. Electronically Signed: By: San Morelle M.D. On: 02/08/2020 10:34   EEG adult  Result Date: 02/08/2020 Lora Havens, MD     02/08/2020  1:32 PM Patient Name: Monique Cabrera MRN: [id number] Epilepsy Attending: Lora Havens Referring Physician/Provider: Laurey Morale, NP Date: 02/08/2020 Duration: 36.20 minutes Patient history: 85 year old female with prior medical history of stroke who presented with unresponsiveness and right-sided weakness.  CT head negative for hemorrhage.  EEG evaluate for seizures. Level of alertness: Lethargic AEDs during EEG study: None Technical aspects: This EEG study was done with scalp electrodes positioned according to the 10-20 International system of electrode placement. Electrical activity was acquired at a sampling rate of 500Hz  and reviewed with a high frequency filter of 70Hz  and a low frequency filter of 1Hz . EEG data were recorded continuously and digitally stored. Description: EEG showed continuous generalized low amplitude 2 to 3 Hz delta slowing as well as 13 to 15 Hz  frontocentral beta activity.  Hyperventilation and photic stimulation were not performed. Of note in the beginning, study was technically difficult due to significant myogenic artifact. Abnormality -Continuous slow, generalized IMPRESSION: This study is suggestive of moderate to severe diffuse encephalopathy, nonspecific etiology. No seizures or epileptiform discharges were seen throughout the recording. Lora Havens   CT HEAD CODE STROKE WO CONTRAST  Result Date: 02/08/2020 CLINICAL DATA:  Code stroke. Neuro deficit, acute, stroke suspected. EXAM: CT HEAD WITHOUT CONTRAST TECHNIQUE: Contiguous axial images were obtained from the base of the skull through the vertex without intravenous contrast. COMPARISON:  None. FINDINGS: Brain: No acute infarct, hemorrhage, or mass lesion is present. Moderate generalized atrophy and white matter disease is stable. The ventricles are proportionate to the degree of atrophy. Basal ganglia and insular cortex is bilaterally. No acute or focal cortical abnormality is present. No significant extraaxial fluid collection is present. The brainstem and cerebellum are within normal limits. Vascular: Extensive vascular calcifications extend to the MCA bifurcations bilaterally without significant interval change. No other asymmetric hyperdense vessel is present. Dense calcifications in the basilar artery vertebral arteries are also stable. Skull: Insert normal skull No significant extracranial soft tissue lesion is present. Sinuses/Orbits: The paranasal sinuses and mastoid air cells are clear. Bilateral lens replacements are noted. Globes and orbits are otherwise unremarkable. Other: ASPECTS ([place] Stroke Program Early CT Score) - Ganglionic level infarction (caudate, lentiform nuclei, internal capsule, insula, M1-M3 cortex): 3/3 - Supraganglionic infarction (M4-M6 cortex): 7/7 Total score (0-10 with 10 being normal): 10/10 IMPRESSION: 1. No acute intracranial abnormality or  significant interval change. 2. Stable advanced atrophy and diffuse white matter disease. 3. Advanced calcific atherosclerotic changes extend to the MCA bifurcations and the basilar artery are stable. 4. ASPECTS is 10/10 1. The above was relayed via text pager to Dr. Lorraine Lax on 02/08/2020 at 10:03 . Electronically Signed   By: San Morelle M.D.   On: 02/08/2020 10:04    EKG: Independently reviewed.  Sinus tachcyardia with rate 103; prolonged QTc 523; nonspecific ST changes with no evidence of acute ischemia   Labs on Admission: I have personally reviewed the available labs and imaging studies at the time of the admission.  Pertinent labs:   Na++ 132 CO2 21 Glucose 443, 418 BUN 21/Creatinine 1.70/GFR 32; 20/1.27/46 on 11/17;  16/1.16/51 on 11/11 Calcium 10.8 Anion gap 18 Albumin 3.4 WBC 17.2 INR 1.0 COVID POSITIVE UA: >500 glucose; small Hgb; 20 ketones; >300 protein LDH 205 Ferritin 1248 CRP 1.5 Procalcitonin 1.54 D-dimer 4.48 Fibrinogen 601   Assessment/Plan Principal Problem:   Acute metabolic encephalopathy Active Problems:   Peripheral arterial disease (HCC)   Hypertension   Hyperlipidemia   COVID-19 virus infection   Acute kidney injury superimposed on CKD (HCC)   Acute metabolic encephalopathy -Initial concern for CVA/seizure -While this may be the case, COVID-associated encephalopathy is increasingly likely -Will admit for further evaluation and management -Telemetry monitoring -MRI -Neurology consult - current concern is for cerebral hypoperfusion vs. Suspected ischemic stroke -PT/OT/ST/Nutrition Consults -TOC team consult for placement -Continue ASA/Plavix for now -EEG with moderate to severe diffuse encephalopathy  COVID-19 infection -Patient with a fall yesterday and refused EMS transport; today agreed to transport, clearly confused and altered from baseline according to EMS (who know her well) -Initial concern for CVA/seizures; while these also may  be present, she was found to be COVID POSITIVE and so this may be COVID-associated encephalopathy -The patient has comorbidities which may increase the risk for ARDS/MODS including: age, HTN, DM -Pertinent labs concerning for COVID include increased BUN/Creatinine; increased LFTs; markedly elevated D-dimer (>1); markedly increased ferritin; increased fibrinogen -CXR pending -Will treat with broad-spectrum antibiotics given procalcitonin >0.1 if opacities are present -Will admit to [facility] for further evaluation, close monitoring, and treatment -Monitor on telemetry x at least 24 hours -At this time, will attempt to avoid use of aerosolized medications and use HFAs instead -Will check daily labs including BMP with Mag, Phos; LFTs; CBC with differential; CRP; ferritin; fibrinogen; D-dimer -Will order Remdesivir (pharmacy consult) given +COVID test and encephalopathy -If the patient shows clinical deterioration, consider transfer to ICU with PCCM consultation -Consider Tocilizumab and/or convalescent plasma if the patient does not stabilize on current treatment or if the patient has marked clinical decompensation; the patient does not appear to require these treatments at this time. -Will attempt to maintain euvolemia to a net negative fluid status -Will ask the patient to maintain an awake prone position for 16+ hours a day, if possible, with a minimum of 2-3 hours at a time -With D-dimer <5, will use standard-dosed Lovenox for DVT prevention -Patient was seen wearing full PPE including: gown, gloves, head cover, N95, and face shield; donning and doffing was in compliance with current standards.  HTN -Allow permissive HTN for now -Treat BP only if >220/120, and then with goal of 15% reduction -Hold Norvasc, Coreg, Diovan-HCT and plan to restart in 48-72 hours   HLD -Check FLP -Continue Lipitor but increase to 40 mg daily   DM -Recent A1c shows poor control (8.9 on 11/2) -Continue Lantus -Will  order moderate-scale SSI  PAD -s/p R BKA with good stump healing  AKI on Stage 3a CKD -Likely associated with COVID-19 infection -Since the patient is too obtunded to take PO right now, will start judicious IVF    DVT prophylaxis:  Lovenox  Code Status:  DNR - based on prior admission Family Communication: None present; I attempted to reach both of the patient's daughters by telephone and was unable to reach them Disposition Plan:  She is anticipated to d/c to SNF.  She may require home O2 at the time of discharge. Consults called: Neurology; PT/OT/ST/Nutrition/TOC Admission status: Admit - It is my clinical opinion that admission to INPATIENT is reasonable and necessary because of the expectation that this patient  will require hospital care that crosses at least 2 midnights to treat this condition based on the medical complexity of the problems presented.  Given the aforementioned information, the predictability of an adverse outcome is felt to be significant.     Karmen Bongo MD [facility]   How to contact the [facility] Attending or Consulting provider [place] or covering provider during after hours [place], for this patient?  1. Check the care team in [facility] and look for a) attending/consulting [facility] provider listed and b) the [facility] team listed 2. Log into [email or web address] and use [place]'s universal password to access. If you do not have the password, please contact the hospital operator. 3. Locate the [facility] provider you are looking for under [facility] and page to a number that you can be directly reached. 4. If you still have difficulty reaching the provider, please page the [facility] (Director on Call) for the Hospitalists listed on amion for assistance.   02/08/2020, 7:02 PM

## 2020-02-08 NOTE — ED Notes (Signed)
Pt  Resisted moving arm to allow iv to run I will staraighten it out and she bends it back

## 2020-02-08 NOTE — ED Notes (Signed)
Attempted to give pt  Her po meds , she was unco operative , she will move arms a with purpose but will not acknowledge nurse, has arm with IV bent and resist nurses atempt to straighten it as before

## 2020-02-08 NOTE — Progress Notes (Signed)
   Vital Signs MEWS/VS Documentation      02/08/2020 1712 02/08/2020 1822 02/08/2020 1902 02/08/2020 2032   MEWS Score:  3  4  4  4    MEWS Score Color:  Yellow  Red  Red  Red   Resp:  --  --  --  20   Pulse:  --  --  --  (!) 118   BP:  --  --  --  (!) 171/90   Temp:  --  --  --  98.4 F (36.9 C)   O2 Device:  --  --  --  Room Air   Level of Consciousness:  --  Responds to Pain  --  Responds to Pain           Harl Bowie 02/08/2020,8:36 PM

## 2020-02-08 NOTE — ED Notes (Signed)
Spoke pts daughter about  Pt being admitted  And that she is covid positive  Which pts daughter did not know. pts daughter given number for [facility]  Report to [facility]

## 2020-02-08 NOTE — ED Provider Notes (Signed)
[facility] EMERGENCY DEPARTMENT Provider Note   CSN: [id number] Arrival date & time: 02/08/20  0940  An emergency department physician performed an initial assessment on this suspected stroke patient at 0941.  History Chief Complaint  Patient presents with  . Code Stroke   Monique Cabrera is a 85 y.o. female with past medical history significant for right BKA, type 2 diabetes, hypertension, Kd stage III, chronic anemia presents via EMS as code stroke.  EMS knows patient well. She is alert and oriented x4 usually. She is able to transfer herself to wheelchair without assistance and drives herself to doctors appointments. EMS is frequently called for hypoglycemia, she receives D50 and then usually denies to the hospital.  EMS states they were called yesterday as patient had a headache.  She declined transfer at the time as well.  EMS is now provides all of history.  They state she fell several days ago and struck her head on the bedside table.  She lives home alone but family checks on her frequently.  Today her son found her to have decreased level of responsiveness and incoherent mumbling speech.  Last seen normal was 830 last night.  Level 5 caveat applies as patient cannot speak to provide history.  She takes aspirin.  Past Medical History:  Diagnosis Date  . Acute upper respiratory infections of unspecified site   . Amputee 08/2019  . Anemia   . Anemia, unspecified   . Atherosclerosis of native arteries of the extremities, unspecified   . Chest pain, unspecified   . Chronic kidney disease (CKD), stage II (mild)   . Diarrhea   . Disorder of bone and cartilage, unspecified   . Herpes zoster with other nervous system complications(053.19)   . Hypercalcemia   . Hypertension   . Hypertensive renal disease, benign   . Nonspecific reaction to tuberculin skin test without active tuberculosis(795.51)   . Other and unspecified hyperlipidemia   . Pain in joint,  lower leg   . Peripheral arterial disease ([place])   . Postherpetic neuralgia   . Proteinuria   . Stroke ([place]) 01/2017  . Type II or unspecified type diabetes mellitus with renal manifestations, uncontrolled(250.42)   . Unspecified disorder of kidney and ureter     Patient Active Problem List   Diagnosis Date Noted  . Status post below-knee amputation of right lower extremity ([place]) 11/07/2019  . Gangrene of right foot ([place])   . Hyponatremia 10/29/2019  . Chronic diastolic CHF (congestive heart failure) ([place]) 10/29/2019  . Stable proliferative diabetic retinopathy of both eyes associated with type 2 diabetes mellitus ([place]) 06/05/2018  . Acute CVA (cerebrovascular accident) ([place]) 02/18/2017  . Acute ischemic stroke ([place])   . Internuclear ophthalmoplegia of left eye   . Benign paroxysmal positional vertigo   . Abnormality of gait   . Acute onset of severe vertigo 02/17/2017  . Vertigo 02/17/2017  . Atherosclerosis of native artery of extremity with intermittent claudication ([place]) 03/11/2014  . Diabetic retinopathy ([place]) 03/11/2014  . Retinal hemorrhage due to secondary diabetes ([place]) 03/11/2014  . Type II diabetes mellitus with renal manifestations, uncontrolled ([place]) 03/11/2014  . Chronic hepatitis C without hepatic coma ([place]) 03/11/2014  . Hyperlipidemia 03/11/2014  . Hypoglycemia 04/16/2013  . Disorder of bone and cartilage, unspecified   . Other and unspecified hyperlipidemia   . Essential hypertension, benign   . Atherosclerosis of native artery of extremity ([place])   . Chronic kidney disease (CKD), stage II (mild)   .  Peripheral arterial disease ([place])   . Anemia   . Postherpetic neuralgia   . Hypertension     Past Surgical History:  Procedure Laterality Date  . ABDOMINAL AORTOGRAM W/LOWER EXTREMITY N/A 10/31/2019   Procedure: ABDOMINAL AORTOGRAM W/LOWER EXTREMITY;  Surgeon: Wellington Hampshire, MD;  Location: [facility];  Service: Cardiovascular;  Laterality: N/A;  .  AMPUTATION Right 11/02/2019   Procedure: AMPUTATION BELOW KNEE RIGHT;  Surgeon: Rosetta Posner, MD;  Location: [facility];  Service: Vascular;  Laterality: Right;  . hysterectomy    . INCISION AND DRAINAGE Left 05/27/14   sebacous cyst, ear  . PRP Left    Dr. Ricki Miller  . removal of cyst from hand    . removal of tumor from foot    . TONSILLECTOMY       OB History   No obstetric history on file.     Family History  Problem Relation Age of Onset  . Diabetes Mother   . Diabetes Father   . Diabetes Sister   . Diabetes Sister     Social History   Tobacco Use  . Smoking status: Former Smoker    Types: Cigarettes  . Smokeless tobacco: Never Used  . Tobacco comment: Quit about age 44   Substance Use Topics  . Alcohol use: No    Alcohol/week: 0.0 standard drinks  . Drug use: No    Home Medications Prior to Admission medications   Medication Sig Start Date End Date Taking? Authorizing Provider  amLODipine (NORVASC) 10 MG tablet TAKE 1 TABLET (10 MG TOTAL) BY MOUTH DAILY. FOR HIGH BLOOD PRESSURE 01/29/20   Reed, Tiffany L, DO  aspirin 81 MG chewable tablet Chew 1 tablet (81 mg total) by mouth daily. 11/15/19   Reed, Tiffany L, DO  atorvastatin (LIPITOR) 20 MG tablet TAKE 1 TABLET BY MOUTH EVERY DAY 06/11/19   Despina Hick, MD  carvedilol (COREG) 25 MG tablet TAKE 1 TABLET BY MOUTH TWICE A DAY WITH MEALS 12/31/19   Reed, Tiffany L, DO  clopidogrel (PLAVIX) 75 MG tablet TAKE 1 TABLET BY MOUTH EVERY DAY 10/25/19   Reed, Tiffany L, DO  glucose 4 GM chewable tablet Chew 1 tablet (4 g total) by mouth as needed for low blood sugar. 11/15/19   Reed, Tiffany L, DO  insulin glargine (LANTUS) 100 UNIT/ML injection Inject 0.2 mLs (20 Units total) into the skin daily. 12/13/19   Reed, Tiffany L, DO  insulin lispro (HUMALOG KWIKPEN) 100 UNIT/ML KwikPen 6 units after breakfast, 4 units after lunch and 6 units after supper; only for CBG > 150 01/24/20   Reed, Tiffany L, DO  Insulin Pen Needle (B-D  ULTRAFINE III SHORT PEN) 31G X 8 MM MISC Use to check blood sugar every day. Dx: 11.29; 11.65 07/02/19   Reed, Tiffany L, DO  ONETOUCH VERIO test strip USE TO TEST BLOOD SUGAR THREE TIMES DAILY. DX: E11.9 10/08/19   Reed, Tiffany L, DO  valsartan-hydrochlorothiazide (DIOVAN-HCT) 320-25 MG tablet Take 1 tablet by mouth daily.    [provider]    Allergies    Invokana [canagliflozin] and Jardiance [empagliflozin]  Review of Systems   Review of Systems  Unable to perform ROS: Acuity of condition      Physical Exam Updated Vital Signs BP (!) 202/84   Pulse 91   Temp 99.2 F (37.3 C) (Rectal)   Resp 16   Ht 5\' 5"  (1.651 m)   Wt 60 kg   SpO2  99%   BMI 22.01 kg/m    Physical Exam Vitals and nursing note reviewed.  Constitutional:      General: She is not in acute distress.    Appearance: She is not ill-appearing.  HENT:     Head: Normocephalic and atraumatic.     Right Ear: Tympanic membrane and external ear normal.     Left Ear: Tympanic membrane and external ear normal.     Nose: Nose normal.     Mouth/Throat:     Mouth: Mucous membranes are moist.     Pharynx: Oropharynx is clear.  Eyes:     General: No scleral icterus.       Right eye: No discharge.        Left eye: No discharge.     Extraocular Movements: Extraocular movements intact.     Conjunctiva/sclera: Conjunctivae normal.     Pupils: Pupils are equal, round, and reactive to light.  Neck:     Vascular: No JVD.  Cardiovascular:     Rate and Rhythm: Normal rate and regular rhythm.     Pulses: Normal pulses.          Radial pulses are 2+ on the right side and 2+ on the left side.     Heart sounds: Normal heart sounds.  Pulmonary:     Comments: Lungs clear to auscultation in all fields. Symmetric chest rise. No wheezing, rales, or rhonchi. Abdominal:     Comments: Abdomen is soft, non-distended, and non-tender in all quadrants. No rigidity, no guarding. No peritoneal signs.  Musculoskeletal:         General: Normal range of motion.     Cervical back: Normal range of motion.     Comments: Right BKA.  Spontaneous movement of extremities  Skin:    General: Skin is warm and dry.     Capillary Refill: Capillary refill takes less than 2 seconds.  Neurological:     GCS: GCS eye subscore is 3. GCS verbal subscore is 2. GCS motor subscore is 4.     Comments: Patient unable to follow commands. Speech is not clear.  Unable to hold up left leg or bilateral arms, lets them fall to the bed immediately.   Psychiatric:        Behavior: Behavior normal.     ED Results / Procedures / Treatments   Labs (all labs ordered are listed, but only abnormal results are displayed) Labs Reviewed  APTT - Abnormal; Notable for the following components:      Result Value   aPTT 20 (*)    All other components within normal limits  CBC - Abnormal; Notable for the following components:   WBC 17.2 (*)    All other components within normal limits  DIFFERENTIAL - Abnormal; Notable for the following components:   Neutro Abs 14.9 (*)    Abs Immature Granulocytes 0.19 (*)    All other components within normal limits  COMPREHENSIVE METABOLIC PANEL - Abnormal; Notable for the following components:   Sodium 132 (*)    Chloride 93 (*)    CO2 21 (*)    Glucose, Bld 443 (*)    Creatinine, Ser 1.70 (*)    Calcium 10.8 (*)    Albumin 3.4 (*)    GFR calc non Af Amer 28 (*)    GFR calc Af Amer 32 (*)    Anion gap 18 (*)    All other components within normal limits  I-STAT CHEM 8, ED -  Abnormal; Notable for the following components:   Sodium 131 (*)    Chloride 95 (*)    Creatinine, Ser 1.40 (*)    Glucose, Bld 446 (*)    All other components within normal limits  CBG MONITORING, ED - Abnormal; Notable for the following components:   Glucose-Capillary 437 (*)    All other components within normal limits  RESPIRATORY PANEL BY RT PCR (FLU A&B, COVID)  PROTIME-INR  BASIC METABOLIC PANEL  CBC WITH  DIFFERENTIAL/PLATELET  URINALYSIS, ROUTINE W REFLEX MICROSCOPIC    EKG None  Radiology CT ANGIO HEAD W OR WO CONTRAST  Result Date: 02/08/2020 CLINICAL DATA:  Right-sided weakness. EXAM: CT ANGIOGRAPHY HEAD AND NECK TECHNIQUE: Multidetector CT imaging of the head and neck was performed using the standard protocol during bolus administration of intravenous contrast. Multiplanar CT image reconstructions and MIPs were obtained to evaluate the vascular anatomy. Carotid stenosis measurements (when applicable) are obtained utilizing NASCET criteria, using the distal internal carotid diameter as the denominator. CONTRAST:  46mL OMNIPAQUE IOHEXOL 350 MG/ML SOLN COMPARISON:  None. FINDINGS: CTA NECK FINDINGS Aortic arch: Atherosclerotic calcifications are present within the aortic arch and at the origins the great vessels without significant stenosis or change. There is no aneurysm. Right carotid system: Right common carotid artery is within normal limits. Bifurcation is somewhat obscured by patient motion. No significant stenosis is present. Mild tortuosity is present in the cervical right ICA without significant stenosis. Left carotid system: Left common carotid artery is tortuous. Atherosclerotic changes are present at the bifurcation without significant stenosis. There is mild tortuosity of the cervical left ICA without significant stenosis. Vertebral arteries: The left vertebral artery is the dominant vessel. Both vertebral arteries originate from the subclavian arteries without significant stenosis. There is no definite stenosis in the neck. Skeleton: Degenerative changes are present lower cervical spine. Osseous structures are somewhat distorted by patient motion. No acute abnormalities are present. Other neck: The soft tissues the neck are otherwise unremarkable. Upper chest: Centrilobular emphysematous changes are present. No nodule or mass lesion is present. Thoracic inlet is normal. Review of the MIP  images confirms the above findings CTA HEAD FINDINGS Anterior circulation: Atherosclerotic changes are present within the cavernous internal carotid arteries bilaterally without significant stenosis. Mild narrowing is present in the proximal right A1 segment. No emergent large vessel occlusion is present. There is some attenuation of distal small vessels. Posterior circulation: The left vertebral artery is the dominant vessel. No significant stenosis is present. There is mild narrowing the mid basilar artery. Both posterior cerebral arteries originate from basilar tip. Distal vessel attenuation is present without a significant proximal stenosis or occlusion. Venous sinuses: The dural sinuses patent. The straight sinus and deep cerebral veins are intact. Cortical veins are distorted by patient motion. Anatomic variants: None Review of the MIP images confirms the above findings IMPRESSION: 1. No emergent large vessel occlusion. 2. Atherosclerotic changes at the aortic arch, left carotid bifurcation, and cavernous internal carotid arteries bilaterally without significant stenosis. 3. Mild narrowing of the proximal right A1 segment. 4. Distal small vessel attenuation without a significant proximal stenosis, aneurysm, or branch vessel occlusion within the Circle of Willis. These results were called by telephone at the time of interpretation on 02/08/2020 at 10:05am to provider Dr. Lorraine Lax, Who verbally acknowledged these results. Electronically Signed   By: San Morelle M.D.   On: 02/08/2020 10:34   CT Angio Neck W and/or Wo Contrast  Result Date: 02/08/2020 CLINICAL DATA:  Right-sided weakness.  EXAM: CT ANGIOGRAPHY HEAD AND NECK TECHNIQUE: Multidetector CT imaging of the head and neck was performed using the standard protocol during bolus administration of intravenous contrast. Multiplanar CT image reconstructions and MIPs were obtained to evaluate the vascular anatomy. Carotid stenosis measurements (when  applicable) are obtained utilizing NASCET criteria, using the distal internal carotid diameter as the denominator. CONTRAST:  47mL OMNIPAQUE IOHEXOL 350 MG/ML SOLN COMPARISON:  None. FINDINGS: CTA NECK FINDINGS Aortic arch: Atherosclerotic calcifications are present within the aortic arch and at the origins the great vessels without significant stenosis or change. There is no aneurysm. Right carotid system: Right common carotid artery is within normal limits. Bifurcation is somewhat obscured by patient motion. No significant stenosis is present. Mild tortuosity is present in the cervical right ICA without significant stenosis. Left carotid system: Left common carotid artery is tortuous. Atherosclerotic changes are present at the bifurcation without significant stenosis. There is mild tortuosity of the cervical left ICA without significant stenosis. Vertebral arteries: The left vertebral artery is the dominant vessel. Both vertebral arteries originate from the subclavian arteries without significant stenosis. There is no definite stenosis in the neck. Skeleton: Degenerative changes are present lower cervical spine. Osseous structures are somewhat distorted by patient motion. No acute abnormalities are present. Other neck: The soft tissues the neck are otherwise unremarkable. Upper chest: Centrilobular emphysematous changes are present. No nodule or mass lesion is present. Thoracic inlet is normal. Review of the MIP images confirms the above findings CTA HEAD FINDINGS Anterior circulation: Atherosclerotic changes are present within the cavernous internal carotid arteries bilaterally without significant stenosis. Mild narrowing is present in the proximal right A1 segment. No emergent large vessel occlusion is present. There is some attenuation of distal small vessels. Posterior circulation: The left vertebral artery is the dominant vessel. No significant stenosis is present. There is mild narrowing the mid basilar  artery. Both posterior cerebral arteries originate from basilar tip. Distal vessel attenuation is present without a significant proximal stenosis or occlusion. Venous sinuses: The dural sinuses patent. The straight sinus and deep cerebral veins are intact. Cortical veins are distorted by patient motion. Anatomic variants: None Review of the MIP images confirms the above findings IMPRESSION: 1. No emergent large vessel occlusion. 2. Atherosclerotic changes at the aortic arch, left carotid bifurcation, and cavernous internal carotid arteries bilaterally without significant stenosis. 3. Mild narrowing of the proximal right A1 segment. 4. Distal small vessel attenuation without a significant proximal stenosis, aneurysm, or branch vessel occlusion within the Circle of Willis. These results were called by telephone at the time of interpretation on 02/08/2020 at 10:05am to provider Dr. Lorraine Lax, Who verbally acknowledged these results. Electronically Signed   By: San Morelle M.D.   On: 02/08/2020 10:34   CT HEAD CODE STROKE WO CONTRAST  Result Date: 02/08/2020 CLINICAL DATA:  Code stroke. Neuro deficit, acute, stroke suspected. EXAM: CT HEAD WITHOUT CONTRAST TECHNIQUE: Contiguous axial images were obtained from the base of the skull through the vertex without intravenous contrast. COMPARISON:  None. FINDINGS: Brain: No acute infarct, hemorrhage, or mass lesion is present. Moderate generalized atrophy and white matter disease is stable. The ventricles are proportionate to the degree of atrophy. Basal ganglia and insular cortex is bilaterally. No acute or focal cortical abnormality is present. No significant extraaxial fluid collection is present. The brainstem and cerebellum are within normal limits. Vascular: Extensive vascular calcifications extend to the MCA bifurcations bilaterally without significant interval change. No other asymmetric hyperdense vessel is present. Dense calcifications in  the basilar artery  vertebral arteries are also stable. Skull: Insert normal skull No significant extracranial soft tissue lesion is present. Sinuses/Orbits: The paranasal sinuses and mastoid air cells are clear. Bilateral lens replacements are noted. Globes and orbits are otherwise unremarkable. Other: ASPECTS ([place] Stroke Program Early CT Score) - Ganglionic level infarction (caudate, lentiform nuclei, internal capsule, insula, M1-M3 cortex): 3/3 - Supraganglionic infarction (M4-M6 cortex): 7/7 Total score (0-10 with 10 being normal): 10/10 IMPRESSION: 1. No acute intracranial abnormality or significant interval change. 2. Stable advanced atrophy and diffuse white matter disease. 3. Advanced calcific atherosclerotic changes extend to the MCA bifurcations and the basilar artery are stable. 4. ASPECTS is 10/10 1. The above was relayed via text pager to Dr. Lorraine Lax on 02/08/2020 at 10:03 . Electronically Signed   By: San Morelle M.D.   On: 02/08/2020 10:04    Procedures Procedures (including critical care time)  Medications Ordered in ED Medications  sodium chloride flush (NS) 0.9 % injection 3 mL (has no administration in time range)  nicardipine (CARDENE) 20mg  in 0.86% saline 258ml IV infusion (0.1 mg/ml) (has no administration in time range)  insulin regular, human (MYXREDLIN) 100 units/ 100 mL infusion (has no administration in time range)  0.9 %  sodium chloride infusion (has no administration in time range)  dextrose 5 %-0.45 % sodium chloride infusion (has no administration in time range)  dextrose 50 % solution 0-50 mL (has no administration in time range)  LORazepam (ATIVAN) injection 2 mg (has no administration in time range)  iohexol (OMNIPAQUE) 350 MG/ML injection 50 mL (50 mLs Intravenous Contrast Given 02/08/20 1016)    ED Course  I have reviewed the triage vital signs and the nursing notes.  Pertinent labs & imaging results that were available during my care of the patient were reviewed by me  and considered in my medical decision making (see chart for details).  Vitals:   02/08/20 1043 02/08/20 1045 02/08/20 1100 02/08/20 1115  BP:  (!) 216/80 (!) 223/95 (!) 202/84  Pulse:  91 88 91  Resp:  (!) 21 17 16   Temp: 99.2 F (37.3 C)     TempSrc: Rectal     SpO2:  100% 99% 99%  Weight:      Height:         MDM Rules/Calculators/A&P                      Patient seen and examined. Patient presents awake,  hemodynamically stable, afebrile, non toxic appearing. She is altered on arrival, does not follow commands, unable to lift and hold leg or bilateral arms is moving them spontaneously.  Pupils are equal and reactive, there is a gaze preference to the left. CBG on arrival 437.  CT is negative for a stroke. CTA head and neck without acute findings. No LVO.  Neuro recommends EEG and if negative then proceed to MRI. Leukocytosis of 17.2, no anemia. Hyperglycemia 446, creatinine elevated at 1.4 compared to baseline. Will start cardene drip for BP control and insulin drip for hyperglycemia without DKA.  This case was discussed with Dr. Sabra Heck who has seen the patient and agrees with plan to admit. I spoke with patient's daughter Jeral Fruit [phone]  to update her on plan of care.   Spoke with Dr. Lorin Mercy  with hospitalist service who agrees to assume care of patient and bring into the hospital for further evaluation and management.    Portions of this note were generated with  Lobbyist. Dictation errors may occur despite best attempts at proofreading.   Final Clinical Impression(s) / ED Diagnoses Final diagnoses:  Altered mental status, unspecified altered mental status type    Rx / DC Orders ED Discharge Orders    None       Flint Melter 02/08/20 1152    Noemi [place], MD 02/12/20 [phone]

## 2020-02-09 ENCOUNTER — Inpatient Hospital Stay (HOSPITAL_COMMUNITY): Payer: Medicare Other

## 2020-02-09 DIAGNOSIS — E1129 Type 2 diabetes mellitus with other diabetic kidney complication: Secondary | ICD-10-CM

## 2020-02-09 DIAGNOSIS — E1165 Type 2 diabetes mellitus with hyperglycemia: Secondary | ICD-10-CM

## 2020-02-09 DIAGNOSIS — I6389 Other cerebral infarction: Secondary | ICD-10-CM

## 2020-02-09 DIAGNOSIS — I10 Essential (primary) hypertension: Secondary | ICD-10-CM

## 2020-02-09 LAB — URINALYSIS, ROUTINE W REFLEX MICROSCOPIC
Bilirubin Urine: NEGATIVE
Glucose, UA: >=500 mg/dL — AB
Ketones, ur: 5 mg/dL — AB
Leukocytes,Ua: NEGATIVE
Nitrite: NEGATIVE
Protein, ur: >=300 mg/dL — AB
Specific Gravity, Urine: 1.043 — ABNORMAL HIGH (ref 1.005–1.030)
pH: 5 (ref 5.0–8.0)

## 2020-02-09 LAB — HEMOGLOBIN A1C
Hgb A1c MFr Bld: 11 % — ABNORMAL HIGH (ref 4.8–5.6)
Mean Plasma Glucose: 269 mg/dL

## 2020-02-09 LAB — CBC WITH DIFFERENTIAL/PLATELET
Abs Immature Granulocytes: 0.12 10*3/uL — ABNORMAL HIGH (ref 0.00–0.07)
Basophils Absolute: 0 10*3/uL (ref 0.0–0.1)
Basophils Relative: 0 %
Eosinophils Absolute: 0 10*3/uL (ref 0.0–0.5)
Eosinophils Relative: 0 %
HCT: 35 % — ABNORMAL LOW (ref 36.0–46.0)
Hemoglobin: 12.2 g/dL (ref 12.0–15.0)
Immature Granulocytes: 1 %
Lymphocytes Relative: 11 %
Lymphs Abs: 1.5 10*3/uL (ref 0.7–4.0)
MCH: 30.8 pg (ref 26.0–34.0)
MCHC: 34.9 g/dL (ref 30.0–36.0)
MCV: 88.4 fL (ref 80.0–100.0)
Monocytes Absolute: 1.1 10*3/uL — ABNORMAL HIGH (ref 0.1–1.0)
Monocytes Relative: 8 %
Neutro Abs: 11.6 10*3/uL — ABNORMAL HIGH (ref 1.7–7.7)
Neutrophils Relative %: 80 %
Platelets: 282 10*3/uL (ref 150–400)
RBC: 3.96 MIL/uL (ref 3.87–5.11)
RDW: 12.5 % (ref 11.5–15.5)
WBC: 14.3 10*3/uL — ABNORMAL HIGH (ref 4.0–10.5)
nRBC: 0 % (ref 0.0–0.2)

## 2020-02-09 LAB — COMPREHENSIVE METABOLIC PANEL
ALT: 13 U/L (ref 0–44)
AST: 18 U/L (ref 15–41)
Albumin: 3.1 g/dL — ABNORMAL LOW (ref 3.5–5.0)
Alkaline Phosphatase: 62 U/L (ref 38–126)
Anion gap: 15 (ref 5–15)
BUN: 31 mg/dL — ABNORMAL HIGH (ref 8–23)
CO2: 24 mmol/L (ref 22–32)
Calcium: 10.8 mg/dL — ABNORMAL HIGH (ref 8.9–10.3)
Chloride: 101 mmol/L (ref 98–111)
Creatinine, Ser: 1.8 mg/dL — ABNORMAL HIGH (ref 0.44–1.00)
GFR calc Af Amer: 30 mL/min — ABNORMAL LOW (ref >60)
GFR calc non Af Amer: 26 mL/min — ABNORMAL LOW (ref >60)
Glucose, Bld: 223 mg/dL — ABNORMAL HIGH (ref 70–99)
Potassium: 3.3 mmol/L — ABNORMAL LOW (ref 3.5–5.1)
Sodium: 140 mmol/L (ref 135–145)
Total Bilirubin: 0.5 mg/dL (ref 0.3–1.2)
Total Protein: 7.1 g/dL (ref 6.5–8.1)

## 2020-02-09 LAB — ECHOCARDIOGRAM LIMITED
Height: 65 in
Weight: 2116.42 oz

## 2020-02-09 LAB — C-REACTIVE PROTEIN: CRP: 3.8 mg/dL — ABNORMAL HIGH (ref <1.0)

## 2020-02-09 LAB — LIPID PANEL
Cholesterol: 242 mg/dL — ABNORMAL HIGH (ref 0–200)
HDL: 69 mg/dL (ref >40)
LDL Cholesterol: 153 mg/dL — ABNORMAL HIGH (ref 0–99)
Total CHOL/HDL Ratio: 3.5 RATIO
Triglycerides: 98 mg/dL (ref <150)
VLDL: 20 mg/dL (ref 0–40)

## 2020-02-09 LAB — FERRITIN: Ferritin: 1091 ng/mL — ABNORMAL HIGH (ref 11–307)

## 2020-02-09 LAB — GLUCOSE, CAPILLARY
Glucose-Capillary: 278 mg/dL — ABNORMAL HIGH (ref 70–99)
Glucose-Capillary: 294 mg/dL — ABNORMAL HIGH (ref 70–99)
Glucose-Capillary: 207 mg/dL — ABNORMAL HIGH (ref 70–99)
Glucose-Capillary: 56 mg/dL — ABNORMAL LOW (ref 70–99)
Glucose-Capillary: 108 mg/dL — ABNORMAL HIGH (ref 70–99)
Glucose-Capillary: 100 mg/dL — ABNORMAL HIGH (ref 70–99)

## 2020-02-09 LAB — D-DIMER, QUANTITATIVE: D-Dimer, Quant: 3.4 ug/mL-FEU — ABNORMAL HIGH (ref 0.00–0.50)

## 2020-02-09 LAB — LACTIC ACID, PLASMA: Lactic Acid, Venous: 1.9 mmol/L (ref 0.5–1.9)

## 2020-02-09 MED ORDER — HYDRALAZINE HCL 20 MG/ML IJ SOLN: 5.0000 mg | Freq: Four times a day (QID) | INTRAMUSCULAR | Status: DC | PRN

## 2020-02-09 MED ORDER — ASPIRIN 300 MG RE SUPP
300.0000 mg | Freq: Every day | RECTAL | Status: DC
  Administered 2020-02-09 – 2020-02-10 (×2): 300 mg via RECTAL
  Filled 2020-02-09 (×2): qty 1

## 2020-02-09 MED ORDER — KCL IN DEXTROSE-NACL 40-5-0.9 MEQ/L-%-% IV SOLN
INTRAVENOUS | Status: DC
  Filled 2020-02-09 (×7): qty 1000

## 2020-02-09 MED ORDER — DEXTROSE-NACL 5-0.9 % IV SOLN: INTRAVENOUS | Status: DC

## 2020-02-09 MED ORDER — DEXTROSE 50 % IV SOLN
0.0000 mL | INTRAVENOUS | Status: DC | PRN
  Administered 2020-02-09 – 2020-02-10 (×2): 30 mL via INTRAVENOUS
  Filled 2020-02-09 (×3): qty 50

## 2020-02-09 MED ORDER — INSULIN ASPART 100 UNIT/ML ~~LOC~~ SOLN
0.0000 [IU] | SUBCUTANEOUS | Status: DC
  Administered 2020-02-09: 3 [IU] via SUBCUTANEOUS
  Administered 2020-02-11: 2 [IU] via SUBCUTANEOUS
  Administered 2020-02-11: 1 [IU] via SUBCUTANEOUS
  Administered 2020-02-12 (×2): 2 [IU] via SUBCUTANEOUS
  Administered 2020-02-12: 5 [IU] via SUBCUTANEOUS
  Administered 2020-02-12: 3 [IU] via SUBCUTANEOUS

## 2020-02-09 MED ORDER — INSULIN GLARGINE 100 UNIT/ML ~~LOC~~ SOLN
10.0000 [IU] | Freq: Every day | SUBCUTANEOUS | Status: DC
  Filled 2020-02-09: qty 0.1

## 2020-02-09 NOTE — Progress Notes (Signed)
This nurse spoke with the patient's son, Stevan Born, regarding the patient's status and plan of care.  All questions were answered to his satisfaction, and he expressed appreciation for the update.    [name] stated that he will take responsibility as the point of contact.  His phone number is [phone].

## 2020-02-09 NOTE — Progress Notes (Signed)
It was noted that the patient had not voided in the course of this shift.  Bladder scan revealed a volume of >220ml.  Paged [facility] provider Jeannette Corpus, NP, who ordered a one-time in-and-out catheterization and urinalysis.    This RN performed the in-and-out catheterization with the assistance of Ranelle Oyster, RN, and Wenda Overland, RN.  249ml of foul-smelling urine was drained from the bladder during procedure.  Some sediment was noted in the urine.  A sample was collected using sterile technique and was sent to the lab.  Patient tolerated procedure well.

## 2020-02-09 NOTE — Progress Notes (Signed)
Rehab Admissions Coordinator Note:  Per PT recommendation, patient was screened by Michel Santee for appropriateness for an Inpatient Acute Rehab Consult.  At this time, pt is not mobilizing enough to determine whether she is appropriate for CIR.  Will follow from a distance.   Michel Santee 02/09/2020, 3:31 PM  I can be reached at [phone].

## 2020-02-09 NOTE — Evaluation (Signed)
Physical Therapy Evaluation Patient Details Name: Monique Cabrera MRN: [id number] DOB: 30-Jul-1935 Today's Date: 02/09/2020   History of Present Illness  85 yo female adm to [facility] with AMS, differential is COVID encephalopathy, CVA or seizure.  Pt PMH significant for stroke, DM2, Diabetic retinopathy, PAD, chronic hep B, herpetic neuralgia, gait abnormality, left gangrene s/p BKA in 10/2019.  MRI ordered for when able to conduct MRI w/ + COVID.  CT head negative for acute event.  Clinical Impression  Prior to admission, pt lives alone, has good family support based on information obtained from prior admission. Pt initially lethargic, but able to arouse with stimulation. Pt not following any commands, but very pleasant, smiling, and occasionally visually tracking therapist. She is moving all extremities spontaneously.Requiring total assist for progressing to edge of bed; tolerated sitting edge of bed without hands on assist. SpO2 89-97% on RA, HR 92-122 bpm, BP sitting 141/91 (107), BP supine post mobility 174/84 (110). Pt displays potential left inattention. Unable to safety initiate transfers at this time. Considering PLOF, recommend post acute rehab upon discharge. Suspect good progress if mental status improves.     Follow Up Recommendations CIR    Equipment Recommendations  Other (comment)(TBA)    Recommendations for Other Services Rehab consult     Precautions / Restrictions Precautions Precautions: Fall;Other (comment) Precaution Comments: R BKA Restrictions Weight Bearing Restrictions: No      Mobility  Bed Mobility Overal bed mobility: Needs Assistance Bed Mobility: Supine to Sit;Sit to Supine     Supine to sit: Total assist;+2 for physical assistance Sit to supine: Total assist;+2 for physical assistance   General bed mobility comments: TotalA + 2 as pt unable to follow commands to execute  Transfers                 General transfer comment: Unable to  initiate  Ambulation/Gait                Stairs            Wheelchair Mobility    Modified Rankin (Stroke Patients Only) Modified Rankin (Stroke Patients Only) Pre-Morbid Rankin Score: No symptoms Modified Rankin: Severe disability     Balance Overall balance assessment: Needs assistance Sitting-balance support: Feet supported;No upper extremity supported Sitting balance-Leahy Scale: Fair Sitting balance - Comments: Pt requiring close supervision, at times minA but due to restlessness and decreased awareness                                     Pertinent Vitals/Pain Pain Assessment: Faces Faces Pain Scale: No hurt    Home Living Family/patient expects to be discharged to:: Private residence Living Arrangements: Alone Available Help at Discharge: Family;Available 24 hours/day Type of Home: Apartment Home Access: Level entry     Home Layout: One level Home Equipment: Bedside commode;Cane - single point;Crutches;Shower seat;Wheelchair - [employer] - 2 wheels Additional Comments: Information obtained from prior admission 10/2019    Prior Function Level of Independence: Independent with assistive device(s)         Comments: Assume independent as she lived alone; pt unable to provide PLOF     Hand Dominance   Dominant Hand: Right    Extremity/Trunk Assessment   Upper Extremity Assessment Upper Extremity Assessment: Defer to OT evaluation    Lower Extremity Assessment Lower Extremity Assessment: RLE deficits/detail;LLE deficits/detail RLE Deficits / Details: R BKA, ROM WFL LLE Deficits /  Details: Moving spontaneously    Cervical / Trunk Assessment Cervical / Trunk Assessment: Normal  Communication   Communication: Receptive difficulties;Expressive difficulties  Cognition Arousal/Alertness: Awake/alert Behavior During Therapy: Restless Overall Cognitive Status: Difficult to assess Area of Impairment: Attention;Following  commands                   Current Attention Level: Focused   Following Commands: (unable to follow commands)       General Comments: Pt initially lethargic, but able to be aroused with stimulation i.e. washcloth and sitting up on EOB. Once sitting up, pt pleasantly smiling, restless, gazing up towards right side of ceiling and reaching into air with right arm. Several attempts to verbalize, mostly unintelligble, did laugh and say "yeah, yeah," at one time.       General Comments      Exercises     Assessment/Plan    PT Assessment Patient needs continued PT services  PT Problem List Decreased strength;Decreased activity tolerance;Decreased balance;Decreased mobility;Decreased cognition;Decreased safety awareness       PT Treatment Interventions Gait training;Stair training;Functional mobility training;Therapeutic activities;Therapeutic exercise;Balance training;DME instruction;Patient/family education;Wheelchair mobility training    PT Goals (Current goals can be found in the Care Plan section)  Acute Rehab PT Goals Patient Stated Goal: unable PT Goal Formulation: Patient unable to participate in goal setting Time For Goal Achievement: 02/23/20 Potential to Achieve Goals: Fair    Frequency Min 3X/week   Barriers to discharge        Co-evaluation PT/OT/SLP Co-Evaluation/Treatment: Yes Reason for Co-Treatment: Complexity of the patient's impairments (multi-system involvement);Necessary to address cognition/behavior during functional activity;For patient/therapist safety;To address functional/ADL transfers PT goals addressed during session: Mobility/safety with mobility;Balance         AM-PAC PT "6 Clicks" Mobility  Outcome Measure Help needed turning from your back to your side while in a flat bed without using bedrails?: Total Help needed moving from lying on your back to sitting on the side of a flat bed without using bedrails?: Total Help needed moving to  and from a bed to a chair (including a wheelchair)?: Total Help needed standing up from a chair using your arms (e.g., wheelchair or bedside chair)?: Total Help needed to walk in hospital room?: Total Help needed climbing 3-5 steps with a railing? : Total 6 Click Score: 6    End of Session   Activity Tolerance: Patient tolerated treatment well(VSS) Patient left: in bed;with call bell/phone within reach;with bed alarm set Nurse Communication: Mobility status PT Visit Diagnosis: Other abnormalities of gait and mobility (R26.89);Other symptoms and signs involving the nervous system (R29.898)    Time: 1552-0802 PT Time Calculation (min) (ACUTE ONLY): 25 min   Charges:   PT Evaluation $PT Eval Moderate Complexity: 1 Mod            Wyona Almas, PT, DPT Acute Rehabilitation Services Pager [phone] Office [phone]   Deno Etienne 02/09/2020, 4:30 PM

## 2020-02-09 NOTE — Evaluation (Signed)
Occupational Therapy Evaluation Patient Details Name: Monique Cabrera MRN: [id number] DOB: 1935-05-12 Today's Date: 02/09/2020    History of Present Illness 85 yo female adm to [facility] with AMS, differential is COVID encephalopathy, CVA or seizure.  Pt PMH significant for stroke, DM2, Diabetic retinopathy, PAD, chronic hep B, herpetic neuralgia, gait abnormality, left gangrene s/p BKA in 10/2019.  MRI ordered for when able to conduct MRI w/ + COVID.  CT head negative for acute event.   Clinical Impression   This 85 y/o female presents with the above. PLOF and home setup obtained via chart review and previous admits as pt unable to relay information during this session. Per chart pt was living alone, suspect mod independent with ADL/mobility. Pt initially lethargic today, though able to arouse via external stimuli and once awake pt pleasant, intermittently attempting to engage with therapists given max cues. Pt presenting with notable cognitive impairments, overall is not able to follow any commands and often with unintelligble speech. She currently requires totalA+2 for bed mobility and ADL tasks (moreso due to cognition at this time), once positioned EOB pt able to maintain static balance with minguard-minA throughout. ?suspect likely L inattention as well. Pt will benefit from continued acute OT services and currently recommend post acute therapy services after discharge to progress pt towards her PLOF. Will follow.     Follow Up Recommendations  CIR;SNF;Supervision/Assistance - 24 hour(CIR vs SNF)    Equipment Recommendations  Other (comment)(TBD)           Precautions / Restrictions Precautions Precautions: Fall;Other (comment) Precaution Comments: R BKA Restrictions Weight Bearing Restrictions: No      Mobility Bed Mobility Overal bed mobility: Needs Assistance Bed Mobility: Supine to Sit;Sit to Supine     Supine to sit: Total assist;+2 for physical assistance Sit to supine:  Total assist;+2 for physical assistance   General bed mobility comments: TotalA + 2 as pt unable to follow commands to execute  Transfers                 General transfer comment: Unable to initiate    Balance Overall balance assessment: Needs assistance Sitting-balance support: Feet supported;No upper extremity supported Sitting balance-Leahy Scale: Fair Sitting balance - Comments: Pt requiring close supervision, at times minA but due to restlessness and decreased awareness                                   ADL either performed or assessed with clinical judgement   ADL Overall ADL's : Needs assistance/impaired                                     Functional mobility during ADLs: Total assistance;+2 for physical assistance;+2 for safety/equipment General ADL Comments: pt currently totalA for ADL tasks given weakness and impaired cognition, attempted hand over hand for basic ADL task (face washing) with no initiation to carry over task on her own      Vision   Vision Assessment?: Vision impaired- to be further tested in functional context Additional Comments: ?suspect L inattention, difficult to formally assess given impaired cognition, will continue to asess in functional context      Perception     Praxis      Pertinent Vitals/Pain Pain Assessment: Faces Faces Pain Scale: No hurt     Hand Dominance Right  Extremity/Trunk Assessment Upper Extremity Assessment Upper Extremity Assessment: Generalized weakness;Difficult to assess due to impaired cognition   Lower Extremity Assessment Lower Extremity Assessment: Defer to PT evaluation RLE Deficits / Details: R BKA, ROM WFL LLE Deficits / Details: Moving spontaneously   Cervical / Trunk Assessment Cervical / Trunk Assessment: Normal   Communication Communication Communication: Receptive difficulties;Expressive difficulties   Cognition Arousal/Alertness: Awake/alert Behavior  During Therapy: Restless Overall Cognitive Status: Difficult to assess Area of Impairment: Attention;Following commands                   Current Attention Level: Focused   Following Commands: (unable to follow commands)       General Comments: Pt initially lethargic, but able to be aroused with stimulation i.e. washcloth and sitting up on EOB. Once sitting up, pt pleasantly smiling, restless, gazing up towards right side of ceiling and reaching into air with right arm. Several attempts to verbalize, mostly unintelligble, did laugh and say "yeah, yeah," at one time.    General Comments  SBP in the low 200s much of the day, seated EOB BP 141/91 (107), 174/84 (110) after return to supine. Max HR 122 with activity, spO2 89-97% on RA    Exercises     Shoulder Instructions      Home Living Family/patient expects to be discharged to:: Private residence Living Arrangements: Alone Available Help at Discharge: Family;Available 24 hours/day Type of Home: Apartment Home Access: Level entry     Home Layout: One level     Bathroom Shower/Tub: Teacher, early years/pre: Standard     Home Equipment: Bedside commode;Cane - single point;Crutches;Shower seat;Wheelchair - [employer] - 2 wheels   Additional Comments: Information obtained from prior admission 10/2019      Prior Functioning/Environment Level of Independence: Independent with assistive device(s)        Comments: Assume independent as she lived alone; pt unable to provide PLOF        OT Problem List: Decreased strength;Decreased range of motion;Decreased activity tolerance;Impaired balance (sitting and/or standing);Decreased coordination;Decreased cognition;Impaired vision/perception;Decreased safety awareness;Decreased knowledge of use of DME or AE;Cardiopulmonary status limiting activity      OT Treatment/Interventions: Self-care/ADL training;Therapeutic exercise;DME and/or AE instruction;Energy  conservation;Therapeutic activities;Patient/family education;Balance training;Cognitive remediation/compensation;Visual/perceptual remediation/compensation;Neuromuscular education    OT Goals(Current goals can be found in the care plan section) Acute Rehab OT Goals Patient Stated Goal: unable OT Goal Formulation: Patient unable to participate in goal setting Time For Goal Achievement: 02/23/20 Potential to Achieve Goals: Good  OT Frequency: Min 2X/week   Barriers to D/C:            Co-evaluation PT/OT/SLP Co-Evaluation/Treatment: Yes Reason for Co-Treatment: Complexity of the patient's impairments (multi-system involvement);For patient/therapist safety;To address functional/ADL transfers;Necessary to address cognition/behavior during functional activity PT goals addressed during session: Mobility/safety with mobility;Balance OT goals addressed during session: ADL's and self-care      AM-PAC OT "6 Clicks" Daily Activity     Outcome Measure Help from another person eating meals?: Total Help from another person taking care of personal grooming?: Total Help from another person toileting, which includes using toliet, bedpan, or urinal?: Total Help from another person bathing (including washing, rinsing, drying)?: Total Help from another person to put on and taking off regular upper body clothing?: Total Help from another person to put on and taking off regular lower body clothing?: Total 6 Click Score: 6   End of Session Nurse Communication: Mobility status  Activity Tolerance: Patient tolerated treatment well  Patient left: in bed;with call bell/phone within reach;with bed alarm set;with restraints reapplied  OT Visit Diagnosis: Muscle weakness (generalized) (M62.81);Other symptoms and signs involving cognitive function;Other symptoms and signs involving the nervous system (R29.898)                Time: 1415-9733 OT Time Calculation (min): 25 min Charges:  OT General Charges $OT  Visit: 1 Visit OT Evaluation $OT Eval Moderate Complexity: 1 Mod  Lou Cal, OT [employer] Pager [phone] Office [phone]  Raymondo Band 02/09/2020, 4:52 PM

## 2020-02-09 NOTE — Progress Notes (Signed)
  Echocardiogram 2D Echocardiogram has been performed.  Monique Cabrera 02/09/2020, 12:41 PM

## 2020-02-09 NOTE — Progress Notes (Signed)
Pt is currently resting and alert. Did not need to call family to give an update because the son said he will call in the morning per day nurse Ubaldo Glassing, RN. Will continue to monitor.

## 2020-02-09 NOTE — Progress Notes (Addendum)
MD at bedside. Discussed MEWS, HTN and current unchanged mental status. MD aware and to allow permissive HTN. Pt continues to only respond to voice. Stable on room air. Awaiting MRI. Will continue plan of care.

## 2020-02-09 NOTE — Evaluation (Signed)
Clinical/Bedside Swallow Evaluation Patient Details  Name: Monique Cabrera MRN: [id number] Date of Birth: [birth year]  Today's Date: 02/09/2020 Time: SLP Start Time (ACUTE ONLY): 5597 SLP Stop Time (ACUTE ONLY): 1340 SLP Time Calculation (min) (ACUTE ONLY): 12 min  Past Medical History:  Past Medical History:  Diagnosis Date  . Acute upper respiratory infections of unspecified site   . Amputee 08/2019  . Anemia   . Anemia, unspecified   . Atherosclerosis of native arteries of the extremities, unspecified   . Chest pain, unspecified   . Chronic kidney disease (CKD), stage II (mild)   . Diarrhea   . Disorder of bone and cartilage, unspecified   . Herpes zoster with other nervous system complications(053.19)   . Hypercalcemia   . Hypertension   . Hypertensive renal disease, benign   . Nonspecific reaction to tuberculin skin test without active tuberculosis(795.51)   . Other and unspecified hyperlipidemia   . Pain in joint, lower leg   . Peripheral arterial disease ([place])   . Postherpetic neuralgia   . Proteinuria   . Stroke ([place]) 01/2017  . Type II or unspecified type diabetes mellitus with renal manifestations, uncontrolled(250.42)   . Unspecified disorder of kidney and ureter    Past Surgical History:  Past Surgical History:  Procedure Laterality Date  . ABDOMINAL AORTOGRAM W/LOWER EXTREMITY N/A 10/31/2019   Procedure: ABDOMINAL AORTOGRAM W/LOWER EXTREMITY;  Surgeon: Wellington Hampshire, MD;  Location: [place] [facility];  Service: Cardiovascular;  Laterality: N/A;  . AMPUTATION Right 11/02/2019   Procedure: AMPUTATION BELOW KNEE RIGHT;  Surgeon: Rosetta Posner, MD;  Location: [facility];  Service: Vascular;  Laterality: Right;  . hysterectomy    . INCISION AND DRAINAGE Left 05/27/14   sebacous cyst, ear  . PRP Left    Dr. Ricki Miller  . removal of cyst from hand    . removal of tumor from foot    . TONSILLECTOMY     HPI:  85 yo female adm to [facility] with AMS, differential is COVID  encephalopathy, CVA or seizure.  Pt PMH + for CVAs- one 02/18/17, DM2, Diabetic retinopathy, PAD, chronic hep B, herpetic neuralgia, gait abnormality, left gangrene s/p BKA in 10/2019.  MRI ordered for when able to conduct MRI w/ + COVID.  CT head negative for acute event, stable atrohy.   Assessment / Plan / Recommendation Clinical Impression  Pt with waxing/waning attention which is impairing her ability to consume po safely.  She did not follow commands but did verbalize nonsensical output but also uh huh and uh uh to indicate yes/no appropriately x2 during session.  She was moving her arms upward in an athetoid type movement and toward SlP for approx one minute and then stopped.  Pt did open mouth slightly to accept single small ice chip but demonstrated poor awareness and inadequate labial seal resulting in anterior loss.  Furhter trials were greeted with her turning her head and not opening mouth to accept intake.  She also demonstrate decreased responses (but all vitals stable) for approx 3 minutes.  When she did phonate, her voice was soft but appears to be managing secretions.  She is not appropriate for po at this time and SlP will follow up for readiness as well as speech/language evaluation. SLP Visit Diagnosis: Dysphagia, oropharyngeal phase (R13.12)    Aspiration Risk  Severe aspiration risk;Risk for inadequate nutrition/hydration    Diet Recommendation NPO        Other  Recommendations Oral Care Recommendations:  Oral care QID   Follow up Recommendations Skilled Nursing facility      Frequency and Duration min 2x/week  2 weeks       Prognosis Prognosis for Safe Diet Advancement: Guarded Barriers to Reach Goals: Cognitive deficits      Swallow Study   General Date of Onset: 02/09/20 HPI: 85 yo female adm to [facility] with AMS, differential is COVID encephalopathy, CVA or seizure.  Pt PMH + for CVAs- one 02/18/17, DM2, Diabetic retinopathy, PAD, chronic hep B, herpetic neuralgia,  gait abnormality, left gangrene s/p BKA in 10/2019.  MRI ordered for when able to conduct MRI w/ + COVID.  CT head negative for acute event, stable atrohy. Type of Study: Bedside Swallow Evaluation Diet Prior to this Study: NPO Temperature Spikes Noted: No Respiratory Status: Nasal cannula History of Recent Intubation: No Behavior/Cognition: Alert;Doesn't follow directions Oral Cavity Assessment: Other (comment)(xerostomic with dry white secretions on lateral tongue but pt did not allow SLP to provide oral care) Oral Care Completed by SLP: No(pt closed lips tightly) Oral Cavity - Dentition: Other (Comment)(difficult to view but appeared to lack dentition) Vision: Impaired for self-feeding(pt with diabetic opthamalic deficits suspect decreased ability) Patient Positioning: Upright in bed Baseline Vocal Quality: Low vocal intensity(she did say "uh huh" and "uh uh" with vocal tongue and nonsensical language heard) Volitional Cough: Cognitively unable to elicit Volitional Swallow: Unable to elicit    Oral/Motor/Sensory Function Overall Oral Motor/Sensory Function: Generalized oral weakness(pt did not follow directions, able to seal lips closed to protest po trials)   Ice Chips Ice chips: Impaired Oral Phase Impairments: Reduced labial seal;Poor awareness of bolus Oral Phase Functional Implications: Left anterior spillage Other Comments: pt did open mouth x1 to accept small ice chips - did not seal lips on spoon and ice fell from her anterior oral cavity with two attempts   Thin Liquid Other Comments: use of applejuice on end of straw provided for taste at lips - pt shook her head to indicate no and did not open for any further trials    Nectar Thick Nectar Thick Liquid: Not tested   Honey Thick Honey Thick Liquid: Not tested   Puree Puree: Not tested   Solid     Solid: Not tested      Macario Golds 02/09/2020,1:53 PM Kathleen Lime, MS [place] Office  [phone]

## 2020-02-09 NOTE — Progress Notes (Addendum)
Initial Nutrition Assessment  RD working remotely.  DOCUMENTATION CODES:   Not applicable  INTERVENTION:  Will monitor for SLP evaluation and diet advancement once patient is more alert. Patient will benefit from oral nutrition supplements once diet able to be advanced.  If patient expected to remain obtunded and diet advancement is unlikely in the next few days consider placement of small-bore feeding tube for initiation of tube feeding if this aligns with patient's goals of care.  If tube feeds are initiated recommend: -Jevity 1.5 Cal at 40 mL/hr + Pro-Stat 30 mL daily per tube. Provides 1540 kcal, 76 grams of protein, 730 mL H2O daily. -MVI daily -Minimum free water flush of 30 mL Q4hrs to maintain tube patency  NUTRITION DIAGNOSIS:   Inadequate oral intake related to inability to eat, lethargy/confusion as evidenced by NPO status.  GOAL:   Patient will meet greater than or equal to 90% of their needs  MONITOR:   Diet advancement, Labs, Weight trends, Skin, I & O's  REASON FOR ASSESSMENT:   Malnutrition Screening Tool, Consult(encephalopathy)    ASSESSMENT:   85 year old female with PMHx of HTN, DM, HLD, PAD, CKD, hx CVA, hx right BKA 11/02/2019 who is admitted for acute neurological deficits/acute encephalopathy, COVID-19 infection, DKA, AKI on CKD stage III.   Patient is obtunded and unable to provide any nutrition/weight history over the phone at this time. She is NPO at this time. Pending SLP evaluation but this will likely be held until patient more alert. According to documentation in chart patient's family reported she had one meal on Wednesday and then maybe a protein shake on Thursday.  According to weight history in chart patient was around 150 lbs in 2019. She was 147 lbs on 02/15/2019. She is currently 60 kg (132.28 lbs). She has lost 14.72 lbs (10% body weight) over the past year, which is not significant for time frame.  Medications reviewed and include:  Novolog 0-9 units Q4hrs, Lantus 20 units daily, NS at 100 mL/hr, remdesivir.  Labs reviewed: CBG 207-294. On 2/12 Sodium 131, Chloride 95, Creatinine 1.4.  Unable to determine if patient meets criteria for malnutrition at this time.  NUTRITION - FOCUSED PHYSICAL EXAM:  Unable to complete as RD working remotely.  Diet Order:   Diet Order            Diet NPO time specified  Diet effective now             EDUCATION NEEDS:   No education needs have been identified at this time  Skin:  Skin Assessment: Reviewed RN Assessment  Last BM:  Unknown  Height:   Ht Readings from Last 1 Encounters:  02/08/20 5\' 5"  (1.651 m)   Weight:   Wt Readings from Last 1 Encounters:  02/08/20 60 kg   Ideal Body Weight:  56.8 kg  BMI:  Body mass index is 22.01 kg/m.  Estimated Nutritional Needs:   Kcal:  1300-1500  Protein:  70-80 grams  Fluid:  1.5 L/day  Jacklynn Barnacle, MS, RD, LDN Pager number available on Amion

## 2020-02-09 NOTE — Progress Notes (Addendum)
PROGRESS NOTE                                                                                                                                                                                                             Patient Demographics:    Monique Cabrera, is a 85 y.o. female, DOB - 05/04/1935, KCL:[id number]  Admit date - 02/08/2020   Admitting Physician Karmen Bongo, MD  Outpatient Primary MD for the patient is [name], DO  LOS - 1   Chief Complaint  Patient presents with  . Code Stroke       Brief Narrative    85 y.o. female with medical history significant of HTN; DM; CVA (2018); PAD s/p R BKA (10/2019); HLD; HTN; and stage 2 CKD presenting with unresponsiveness and right-sided weakness  , concerning for CVA.  She is obtunded and essentially unresponsive and so unable to answer questions.  Patient was seen by neurology, with clinical suspicion for CVA, admitted for CVA work-up.   Subjective:    [name] today remains obtunded, unable to provide any complaints, no significant events as discussed with staff.   Assessment  & Plan :    Principal Problem:   Acute metabolic encephalopathy Active Problems:   Peripheral arterial disease (HCC)   Hypertension   Type II diabetes mellitus with renal manifestations, uncontrolled (HCC)   Hyperlipidemia   COVID-19 virus infection   Acute kidney injury superimposed on CKD ([place])   Acute neurological deficits/acute encephalopathy -Management per neurology, highly suspicious for acute CVA. - CT head with no acute intracranial abnormalities. - CTA head and neck with no high-grade stenosis or occlusion.  No indication for carotid Dopplers -MRI head pending. -2D echo pending -EEG suggestive of moderate to severe diffuse encephalopathy, no seizures or epileptiform discharges were seen. -LDL 60 - A1c is pending. -Patient on aspirin and Plavix, but she is also cannot take any oral  intake, so we will give aspirin suppository.  COVID-19 infection  -So far no hypoxia, no indication for steroids -Continue with IV remdesivir. -Dimers elevated at 4.5, will check venous Doppler, 2D echo, especially in the setting of possible acute CVA.  HTN -Allow permissive HTNfor now -Treat BP only if >220/120, and then with goal of 15% reduction -HoldNorvasc, Coreg, Diovan-HCTand plan to restart in 48-72 hours  HLD -LDL is  60, resume statin when able to take oral  DM with mild DKA on admission -Patient is known to be diabetic, anion gap elevated 18 on presentation, with evidence of ketonuria and hyperglycemia. -Follow-up on CMP this morning to ensure an anion gap has closed, but CBG is better controlled, continue with Lantus and insulin sliding scale every 4 hours. -Follow on hemoglobin A1c.Recent A1c shows poor control (8.9 on 11/2)  PAD -s/p R BKA with good stump healing  AKI on Stage 3a CKD -Likely associated with COVID-19 infection -Since the patient is too obtunded to take PO right now, will start judicious IVF     COVID-19 Labs  Recent Labs    02/08/20 1530  DDIMER 4.48*  FERRITIN 1,248*  LDH 205*  CRP 1.5*    Lab Results  Component Value Date   SARSCOV2NAA POSITIVE (A) 02/08/2020   [place] NEGATIVE 10/29/2019     Code Status : DNR  Family Communication  : I have tried to reach family on by 4  contact available phone numbers, able to leave voicemail for [employer].  Disposition Plan  : Pending PT/OT/SLP evaluation  Barriers For Discharge : Patient still having active CVA work-up, remains altered, unable to take an oral intake.  Consults  :  Neurology  Procedures  : None  DVT Prophylaxis  :  [place] lovenox  Lab Results  Component Value Date   PLT 309 02/08/2020    Antibiotics  :    Anti-infectives (From admission, onward)   Start     Dose/Rate Route Frequency Ordered Stop   02/09/20 1000  remdesivir 100 mg in sodium chloride  0.9 % 100 mL IVPB     100 mg 200 mL/hr over 30 Minutes Intravenous Daily 02/08/20 1924 02/13/20 0959   02/08/20 2000  remdesivir 200 mg in sodium chloride 0.9% 250 mL IVPB     200 mg 580 mL/hr over 30 Minutes Intravenous Once 02/08/20 1924 02/08/20 2312        Objective:   Vitals:   02/09/20 0400 02/09/20 0409 02/09/20 0746 02/09/20 0800  BP:  (!) 191/91  (!) 208/98  Pulse: (!) 104 (!) 106    Resp: (!) 25 20  16   Temp:  98.2 F (36.8 C) 98.6 F (37 C)   TempSrc:  Axillary Axillary   SpO2:  96%  96%  Weight:      Height:        Wt Readings from Last 3 Encounters:  02/08/20 60 kg  01/24/20 60.3 kg  12/04/19 60.3 kg     Intake/Output Summary (Last 24 hours) at 02/09/2020 1036 Last data filed at 02/09/2020 0431 Gross per 24 hour  Intake 591.17 ml  Output 200 ml  Net 391.17 ml     Physical Exam  Patient is lethargic, does not open her eyes, does not follow commands or answer any questions . Symmetrical Chest wall movement, Good air movement bilaterally, CTAB RRR,No Gallops,Rubs or new Murmurs, No Parasternal Heave +ve B.Sounds, Abd Soft, No tenderness, N No rebound - guarding or rigidity. No Cyanosis, Clubbing or edema, No new Rash or bruise  The patient left the office before the visit was finished. BKA    Data Review:    CBC Recent Labs  Lab 02/08/20 0950 02/08/20 0953  WBC 17.2*  --   HGB 12.5 12.9  HCT 36.5 38.0  PLT 309  --   MCV 90.3  --   MCH 30.9  --   MCHC 34.2  --  RDW 12.4  --   LYMPHSABS 1.2  --   MONOABS 0.8  --   EOSABS 0.0  --   BASOSABS 0.0  --     Chemistries  Recent Labs  Lab 02/08/20 0950 02/08/20 0953  NA 132* 131*  K 4.0 4.0  CL 93* 95*  CO2 21*  --   GLUCOSE 443* 446*  BUN 21 23  CREATININE 1.70* 1.40*  CALCIUM 10.8*  --   AST 22  --   ALT 17  --   ALKPHOS 69  --   BILITOT 1.2  --    ------------------------------------------------------------------------------------------------------------------ No results for  input(s): CHOL, HDL, LDLCALC, TRIG, CHOLHDL, LDLDIRECT in the last 72 hours.  Lab Results  Component Value Date   HGBA1C 8.9 (H) 10/29/2019   ------------------------------------------------------------------------------------------------------------------ No results for input(s): TSH, T4TOTAL, T3FREE, THYROIDAB in the last 72 hours.  Invalid input(s): FREET3 ------------------------------------------------------------------------------------------------------------------ Recent Labs    02/08/20 1530  FERRITIN 1,248*    Coagulation profile Recent Labs  Lab 02/08/20 0950  INR 1.0    Recent Labs    02/08/20 1530  DDIMER 4.48*    Cardiac Enzymes No results for input(s): CKMB, TROPONINI, MYOGLOBIN in the last 168 hours.  Invalid input(s): CK ------------------------------------------------------------------------------------------------------------------ No results found for: BNP  Inpatient Medications  Scheduled Meds: . aspirin  81 mg Oral Daily  . atorvastatin  40 mg Oral q1800  . clopidogrel  75 mg Oral Daily  . enoxaparin (LOVENOX) injection  30 mg Subcutaneous Q24H  . insulin aspart  0-15 Units Subcutaneous TID WC  . insulin aspart  0-5 Units Subcutaneous QHS  . insulin glargine  20 Units Subcutaneous Daily  . LORazepam  1 mg Intravenous Once  . sodium chloride flush  3 mL Intravenous Q12H   Continuous Infusions: . sodium chloride Stopped (02/09/20 0352)  . remdesivir 100 mg in NS 100 mL 100 mg (02/09/20 0916)   PRN Meds:.acetaminophen **OR** acetaminophen (TYLENOL) oral liquid 160 mg/5 mL **OR** acetaminophen, ondansetron (ZOFRAN) IV, senna-docusate  Micro Results Recent Results (from the past 240 hour(s))  Respiratory Panel by RT PCR (Flu A&B, Covid) - Nasopharyngeal Swab     Status: Abnormal   Collection Time: 02/08/20  9:45 AM   Specimen: Nasopharyngeal Swab  Result Value Ref Range Status   SARS Coronavirus 2 by RT PCR POSITIVE (A) NEGATIVE Final     Comment: RESULT CALLED TO, READ BACK BY AND VERIFIED WITH: A. Florene Glen RN 11:50 02/08/20 ([email or web address]) (NOTE) SARS-CoV-2 target nucleic acids are DETECTED. SARS-CoV-2 RNA is generally detectable in upper respiratory specimens  during the acute phase of infection. Positive results are indicative of the presence of the identified virus, but do not rule out bacterial infection or co-infection with other pathogens not detected by the test. Clinical correlation with patient history and other diagnostic information is necessary to determine patient infection status. The expected result is Negative. Fact Sheet for Patients:  PinkCheek.be Fact Sheet for Healthcare Providers: GravelBags.it This test is not yet approved or cleared by the [place] FDA and  has been authorized for detection and/or diagnosis of SARS-CoV-2 by FDA under an Emergency Use Authorization (EUA).  This EUA will remain in effect (meaning this test can be used)  for the duration of  the COVID-19 declaration under Section 564(b)(1) of the Act, 21 U.S.C. section 360bbb-3(b)(1), unless the authorization is terminated or revoked sooner.    Influenza A by PCR NEGATIVE NEGATIVE Final   Influenza B by PCR NEGATIVE NEGATIVE Final  Comment: (NOTE) The Xpert Xpress SARS-CoV-2/FLU/RSV assay is intended as an aid in  the diagnosis of influenza from Nasopharyngeal swab specimens and  should not be used as a sole basis for treatment. Nasal washings and  aspirates are unacceptable for Xpert Xpress SARS-CoV-2/FLU/RSV  testing. Fact Sheet for Patients: PinkCheek.be Fact Sheet for Healthcare Providers: GravelBags.it This test is not yet approved or cleared by the [place] FDA and  has been authorized for detection and/or diagnosis of SARS-CoV-2 by  FDA under an Emergency Use Authorization (EUA). This EUA will remain    in effect (meaning this test can be used) for the duration of the  Covid-19 declaration under Section 564(b)(1) of the Act, 21  U.S.C. section 360bbb-3(b)(1), unless the authorization is  terminated or revoked. Performed at [facility], [place] [address], [place], [place] [address]     Radiology Reports CT ANGIO HEAD W OR WO CONTRAST  Addendum Date: 02/08/2020   ADDENDUM REPORT: 02/08/2020 14:54 ADDENDUM: A 2 mm left MCA bifurcation aneurysm is stable from the prior exam. This is noted on further inquiry for explanation of right-sided symptoms. This is unlikely to be related to the patient's symptoms. Electronically Signed   By: San Morelle M.D.   On: 02/08/2020 14:54   Result Date: 02/08/2020 CLINICAL DATA:  Right-sided weakness. EXAM: CT ANGIOGRAPHY HEAD AND NECK TECHNIQUE: Multidetector CT imaging of the head and neck was performed using the standard protocol during bolus administration of intravenous contrast. Multiplanar CT image reconstructions and MIPs were obtained to evaluate the vascular anatomy. Carotid stenosis measurements (when applicable) are obtained utilizing NASCET criteria, using the distal internal carotid diameter as the denominator. CONTRAST:  75mL OMNIPAQUE IOHEXOL 350 MG/ML SOLN COMPARISON:  None. FINDINGS: CTA NECK FINDINGS Aortic arch: Atherosclerotic calcifications are present within the aortic arch and at the origins the great vessels without significant stenosis or change. There is no aneurysm. Right carotid system: Right common carotid artery is within normal limits. Bifurcation is somewhat obscured by patient motion. No significant stenosis is present. Mild tortuosity is present in the cervical right ICA without significant stenosis. Left carotid system: Left common carotid artery is tortuous. Atherosclerotic changes are present at the bifurcation without significant stenosis. There is mild tortuosity of the cervical left ICA without significant stenosis.  Vertebral arteries: The left vertebral artery is the dominant vessel. Both vertebral arteries originate from the subclavian arteries without significant stenosis. There is no definite stenosis in the neck. Skeleton: Degenerative changes are present lower cervical spine. Osseous structures are somewhat distorted by patient motion. No acute abnormalities are present. Other neck: The soft tissues the neck are otherwise unremarkable. Upper chest: Centrilobular emphysematous changes are present. No nodule or mass lesion is present. Thoracic inlet is normal. Review of the MIP images confirms the above findings CTA HEAD FINDINGS Anterior circulation: Atherosclerotic changes are present within the cavernous internal carotid arteries bilaterally without significant stenosis. Mild narrowing is present in the proximal right A1 segment. No emergent large vessel occlusion is present. There is some attenuation of distal small vessels. Posterior circulation: The left vertebral artery is the dominant vessel. No significant stenosis is present. There is mild narrowing the mid basilar artery. Both posterior cerebral arteries originate from basilar tip. Distal vessel attenuation is present without a significant proximal stenosis or occlusion. Venous sinuses: The dural sinuses patent. The straight sinus and deep cerebral veins are intact. Cortical veins are distorted by patient motion. Anatomic variants: None Review of the MIP images confirms  the above findings IMPRESSION: 1. No emergent large vessel occlusion. 2. Atherosclerotic changes at the aortic arch, left carotid bifurcation, and cavernous internal carotid arteries bilaterally without significant stenosis. 3. Mild narrowing of the proximal right A1 segment. 4. Distal small vessel attenuation without a significant proximal stenosis, aneurysm, or branch vessel occlusion within the Circle of Willis. These results were called by telephone at the time of interpretation on 02/08/2020  at 10:05am to provider Dr. Lorraine Lax, Who verbally acknowledged these results. Electronically Signed: By: San Morelle M.D. On: 02/08/2020 10:34   CT Angio Neck W and/or Wo Contrast  Addendum Date: 02/08/2020   ADDENDUM REPORT: 02/08/2020 14:54 ADDENDUM: A 2 mm left MCA bifurcation aneurysm is stable from the prior exam. This is noted on further inquiry for explanation of right-sided symptoms. This is unlikely to be related to the patient's symptoms. Electronically Signed   By: San Morelle M.D.   On: 02/08/2020 14:54   Result Date: 02/08/2020 CLINICAL DATA:  Right-sided weakness. EXAM: CT ANGIOGRAPHY HEAD AND NECK TECHNIQUE: Multidetector CT imaging of the head and neck was performed using the standard protocol during bolus administration of intravenous contrast. Multiplanar CT image reconstructions and MIPs were obtained to evaluate the vascular anatomy. Carotid stenosis measurements (when applicable) are obtained utilizing NASCET criteria, using the distal internal carotid diameter as the denominator. CONTRAST:  43mL OMNIPAQUE IOHEXOL 350 MG/ML SOLN COMPARISON:  None. FINDINGS: CTA NECK FINDINGS Aortic arch: Atherosclerotic calcifications are present within the aortic arch and at the origins the great vessels without significant stenosis or change. There is no aneurysm. Right carotid system: Right common carotid artery is within normal limits. Bifurcation is somewhat obscured by patient motion. No significant stenosis is present. Mild tortuosity is present in the cervical right ICA without significant stenosis. Left carotid system: Left common carotid artery is tortuous. Atherosclerotic changes are present at the bifurcation without significant stenosis. There is mild tortuosity of the cervical left ICA without significant stenosis. Vertebral arteries: The left vertebral artery is the dominant vessel. Both vertebral arteries originate from the subclavian arteries without significant stenosis.  There is no definite stenosis in the neck. Skeleton: Degenerative changes are present lower cervical spine. Osseous structures are somewhat distorted by patient motion. No acute abnormalities are present. Other neck: The soft tissues the neck are otherwise unremarkable. Upper chest: Centrilobular emphysematous changes are present. No nodule or mass lesion is present. Thoracic inlet is normal. Review of the MIP images confirms the above findings CTA HEAD FINDINGS Anterior circulation: Atherosclerotic changes are present within the cavernous internal carotid arteries bilaterally without significant stenosis. Mild narrowing is present in the proximal right A1 segment. No emergent large vessel occlusion is present. There is some attenuation of distal small vessels. Posterior circulation: The left vertebral artery is the dominant vessel. No significant stenosis is present. There is mild narrowing the mid basilar artery. Both posterior cerebral arteries originate from basilar tip. Distal vessel attenuation is present without a significant proximal stenosis or occlusion. Venous sinuses: The dural sinuses patent. The straight sinus and deep cerebral veins are intact. Cortical veins are distorted by patient motion. Anatomic variants: None Review of the MIP images confirms the above findings IMPRESSION: 1. No emergent large vessel occlusion. 2. Atherosclerotic changes at the aortic arch, left carotid bifurcation, and cavernous internal carotid arteries bilaterally without significant stenosis. 3. Mild narrowing of the proximal right A1 segment. 4. Distal small vessel attenuation without a significant proximal stenosis, aneurysm, or branch vessel occlusion within the Circle of  Will[place]. These results were called by telephone at the time of interpretation on 02/08/2020 at 10:05am to provider Dr. Lorraine Lax, Who verbally acknowledged these results. Electronically Signed: By: San Morelle M.D. On: 02/08/2020 10:34   DG CHEST  PORT 1 VIEW  Result Date: 02/08/2020 CLINICAL DATA:  COVID-19 positive, altered mental status EXAM: PORTABLE CHEST 1 VIEW COMPARISON:  Radiograph 10/04/2019 FINDINGS: Coarse interstitial changes are similar to priors. No consolidation, features of edema, pneumothorax, or effusion. Pulmonary vascularity is normally distributed. The aorta is calcified. The remaining cardiomediastinal contours are unremarkable. No acute osseous or soft tissue abnormality. No acute osseous or soft tissue abnormality. IMPRESSION: No acute cardiopulmonary findings. Coarse interstitial changes are similar to priors. Aortic Atherosclerosis (ICD10-I70.0). Electronically Signed   By: Lovena Le M.D.   On: 02/08/2020 19:47   DG Abd Portable 1V  Result Date: 02/09/2020 CLINICAL DATA:  Metal screening prior to MRI. EXAM: PORTABLE ABDOMEN - 1 VIEW COMPARISON:  CT abdomen and pelvis 08/21/2012 FINDINGS: No radiopaque foreign body is identified in the abdomen or pelvis with incomplete imaging of the inferior pelvic ring. No dilated loops of bowel are seen to suggest obstruction. Atherosclerotic vascular calcifications are noted. The visualized lung bases are grossly clear. IMPRESSION: No metallic foreign body identified. Electronically Signed   By: Logan Bores M.D.   On: 02/09/2020 10:04   EEG adult  Result Date: 02/08/2020 Lora Havens, MD     02/08/2020  1:32 PM Patient Name: TRISTINE LANGI MRN: [id number] Epilepsy Attending: Lora Havens Referring Physician/Provider: Laurey Morale, NP Date: 02/08/2020 Duration: 36.20 minutes Patient history: 85 year old female with prior medical history of stroke who presented with unresponsiveness and right-sided weakness.  CT head negative for hemorrhage.  EEG evaluate for seizures. Level of alertness: Lethargic AEDs during EEG study: None Technical aspects: This EEG study was done with scalp electrodes positioned according to the 10-20 International system of electrode placement.  Electrical activity was acquired at a sampling rate of 500Hz  and reviewed with a high frequency filter of 70Hz  and a low frequency filter of 1Hz . EEG data were recorded continuously and digitally stored. Description: EEG showed continuous generalized low amplitude 2 to 3 Hz delta slowing as well as 13 to 15 Hz frontocentral beta activity.  Hyperventilation and photic stimulation were not performed. Of note in the beginning, study was technically difficult due to significant myogenic artifact. Abnormality -Continuous slow, generalized IMPRESSION: This study is suggestive of moderate to severe diffuse encephalopathy, nonspecific etiology. No seizures or epileptiform discharges were seen throughout the recording. Lora Havens   CT HEAD CODE STROKE WO CONTRAST  Result Date: 02/08/2020 CLINICAL DATA:  Code stroke. Neuro deficit, acute, stroke suspected. EXAM: CT HEAD WITHOUT CONTRAST TECHNIQUE: Contiguous axial images were obtained from the base of the skull through the vertex without intravenous contrast. COMPARISON:  None. FINDINGS: Brain: No acute infarct, hemorrhage, or mass lesion is present. Moderate generalized atrophy and white matter disease is stable. The ventricles are proportionate to the degree of atrophy. Basal ganglia and insular cortex is bilaterally. No acute or focal cortical abnormality is present. No significant extraaxial fluid collection is present. The brainstem and cerebellum are within normal limits. Vascular: Extensive vascular calcifications extend to the MCA bifurcations bilaterally without significant interval change. No other asymmetric hyperdense vessel is present. Dense calcifications in the basilar artery vertebral arteries are also stable. Skull: Insert normal skull No significant extracranial soft tissue lesion is present. Sinuses/Orbits: The paranasal sinuses and mastoid air cells are  clear. Bilateral lens replacements are noted. Globes and orbits are otherwise unremarkable.  Other: ASPECTS ([place] Stroke Program Early CT Score) - Ganglionic level infarction (caudate, lentiform nuclei, internal capsule, insula, M1-M3 cortex): 3/3 - Supraganglionic infarction (M4-M6 cortex): 7/7 Total score (0-10 with 10 being normal): 10/10 IMPRESSION: 1. No acute intracranial abnormality or significant interval change. 2. Stable advanced atrophy and diffuse white matter disease. 3. Advanced calcific atherosclerotic changes extend to the MCA bifurcations and the basilar artery are stable. 4. ASPECTS is 10/10 1. The above was relayed via text pager to Dr. Lorraine Lax on 02/08/2020 at 10:03 . Electronically Signed   By: San Morelle M.D.   On: 02/08/2020 10:04      Phillips Climes M.D on 02/09/2020 at 10:36 AM  Between 7am to 7pm - Pager - [phone]  After 7pm go to [email or web address] - password [facility]  [facility] -  Office  [phone]

## 2020-02-09 NOTE — Progress Notes (Addendum)
STROKE TEAM PROGRESS NOTE   HISTORY OF PRESENT ILLNESS (per record) Monique Cabrera is an 85 y.o. female  With [place] Stroke (2018), DM 2, CKD 2, HTN who presented to [name] ED as a code stroke for right side weakness, and unresponsiveness.  Per son who found her:  Patient was last talking/ communicating with him normally on Wednesday. She was able to communicate and refuse EMS on Thursday, but him and his sister both felt that she was not her normal self. He reports that since Wednesday she has been lying around in bed in the fetal position. She also ate 1 meal that day and maybe had a protein shake on Thursday. She was c/o of a HA yesterday which is why EMS was called but she refused transport to the hospital.   He last saw her at 2030 2/11. This morning at about 0900 he went and found the patient not talking and on the floor. EMS was called. At baseline patient's son does live with her " most days", but patient can maneuver around the house in her wheelchair. She is able to get herself in and out of the wheel chair, she cooks, and able to toilet herself.  On Sunday patient fell and hit her head on the night stand. According to EMS patient is only taking ASA. She had a BKA on the right knee in 2020. Patient was found to have been incontinent of bowel and bladder. ED course:  CTH: no hemorrhage CTA: no LVO BG: 424 BP: 140/78 01/2017:  Punctate acute infarct left para median along the upper floor of the fourth ventricle Date last known well:02/06/11 Time last known well:2030 ? Acting confused for 1- 2 days tPA Given: no; outside of window Modified Rankin: Rankin Score=2 NIHSS:21    INTERVAL HISTORY Presented with AMS and R HP. Overnight today seems quite stuporous with non focal exam     OBJECTIVE Vitals:   02/09/20 0200 02/09/20 0400 02/09/20 0409 02/09/20 0746  BP: (!) 189/88  (!) 191/91   Pulse: 99 (!) 104 (!) 106   Resp: 19 (!) 25 20   Temp: (!) 97.4 F (36.3 C)  98.2 F (36.8 C)  98.6 F (37 C)  TempSrc: Axillary  Axillary Axillary  SpO2: 96%  96%   Weight:      Height:        CBC:  Recent Labs  Lab 02/08/20 0950 02/08/20 0953  WBC 17.2*  --   NEUTROABS 14.9*  --   HGB 12.5 12.9  HCT 36.5 38.0  MCV 90.3  --   PLT 309  --     Basic Metabolic Panel:  Recent Labs  Lab 02/08/20 0950 02/08/20 0953  NA 132* 131*  K 4.0 4.0  CL 93* 95*  CO2 21*  --   GLUCOSE 443* 446*  BUN 21 23  CREATININE 1.70* 1.40*  CALCIUM 10.8*  --     Lipid Panel:     Component Value Date/Time   CHOL 121 02/07/2019 0900   CHOL 204 (H) 11/24/2015 0938   TRIG 53 02/07/2019 0900   HDL 48 (L) 02/07/2019 0900   HDL 53 11/24/2015 0938   CHOLHDL 2.5 02/07/2019 0900   VLDL 24 02/17/2017 2355   LDLCALC 60 02/07/2019 0900   HgbA1c:  Lab Results  Component Value Date   HGBA1C 8.9 (H) 10/29/2019   Urine Drug Screen: No results found for: LABOPIA, COCAINSCRNUR, LABBENZ, AMPHETMU, THCU, LABBARB  Alcohol Level No results found for:  ETH  IMAGING  CT ANGIO HEAD W OR WO CONTRAST CT Angio Neck W and/or Wo Contrast Addendum Date: 02/08/2020   ADDENDUM: A 2 mm left MCA bifurcation aneurysm is stable from the prior exam. This is noted on further inquiry for explanation of right-sided symptoms. This is unlikely to be related to the patient's symptoms.  02/08/2020 IMPRESSION:  1. No emergent large vessel occlusion.  2. Atherosclerotic changes at the aortic arch, left carotid bifurcation, and cavernous internal carotid arteries bilaterally without significant stenosis.  3. Mild narrowing of the proximal right A1 segment.  4. Distal small vessel attenuation without a significant proximal stenosis, aneurysm, or branch vessel occlusion within the Circle of Willis.   CT HEAD CODE STROKE WO CONTRAST 02/08/2020 IMPRESSION:  1. No acute intracranial abnormality or significant interval change.  2. Stable advanced atrophy and diffuse white matter disease.  3. Advanced calcific  atherosclerotic changes extend to the MCA bifurcations and the basilar artery are stable.  4. ASPECTS is 10/10   MRI WO Contrast - pending  DG CHEST PORT 1 VIEW 02/08/2020 IMPRESSION:  No acute cardiopulmonary findings. Coarse interstitial changes are similar to priors. Aortic Atherosclerosis (ICD10-I70.0).   EEG adult 02/08/2020 IMPRESSION:  This study is suggestive of moderate to severe diffuse encephalopathy, nonspecific etiology. No seizures or epileptiform discharges were seen throughout the recording.   Transthoracic Echocardiogram  00/00/2021 Pending  ECG - ST rate 103 BPM. (See cardiology reading for complete details)  PHYSICAL EXAM Blood pressure (!) 191/91, pulse (!) 106, temperature 98.6 F (37 C), temperature source Axillary, resp. rate 20, height 5\' 5"  (1.651 m), weight 60 kg, SpO2 96 %.    GENERAL: Stuporous; some under norished    HEENT: No trauma; supple  ABDOMEN: soft  EXTREMITIES: No edema R BKA; reaching for things  - suggestive of visual hallucinations  BACK: Normal  SKIN: Normal by inspection.    MENTAL STATUS: Eye closed but opens to painful stimuli; mute; does not follow commands   CRANIAL NERVES: Pupils are equal, round and reactive to light; extra ocular movements are full, there is no significant nystagmus; visual fields with possible reduce response to L threat, R fine; upper and lower facial muscles are normal in strength and symmetric, there is no flattening of the nasolabial folds.   MOTOR: Bulk and tone normal; moves all 4 vigorously   COORDINATION: No dysmetria; no tremos; no myclonus    REFLEXES: Deep tendon reflexes are symmetrical and normal  SENSATION: Normal to  pain.         ASSESSMENT/PLAN Monique Cabrera is a 85 y.o. female with history of stroke (2018), DM 2, PAD, Rt BKA, CKD 2, HTN and recent cofusion who presented to [name] ED as a code stroke for right side weakness, and unresponsiveness.   She did not receive IV  t-PA due to late presentation (>4.5 hours from time of onset)  Stroke:  MRI - pending  Resultant  AMS - non focal exam  Code Stroke CT Head - No acute intracranial abnormality or significant interval change. Stable advanced atrophy and diffuse white matter disease. Advanced calcific atherosclerotic changes extend to the MCA bifurcations and the basilar artery are stable. ASPECTS is 10/10   CT head - not ordered  MRI head - pending  MRA head - not ordered  CTA H&N - no high grade stenosis or occlusion   CT Perfusion - not ordered  Carotid Doppler - CTA neck performed - carotid dopplers not indicated.  2D Echo - pending  EEG - This study is suggestive of moderate to severe diffuse encephalopathy, nonspecific etiology. No seizures or epileptiform discharges were seen throughout the recording.   Sars Corona Virus 2 - positive  LDL - 60  HgbA1c - pending  UDS - not ordered  VTE prophylaxis - Lovenox Diet  Diet Order            Diet NPO time specified  Diet effective now              Aspirin 81 mg daily and Plavix 75 mg daily prior to admission, now on aspirin 81 mg daily and clopidogrel 75 mg daily  Patient counseled to be compliant with her antithrombotic medications  Ongoing aggressive stroke risk factor management  Therapy recommendations:  pending  Disposition:  Pending  Hypertension  Home BP meds: Coreg, Norvasc, Diovan HCT  Current BP meds: none  Blood pressure somewhat high at times but within post stroke/TIA parameters . Permissive hypertension (OK if < 220/120) but gradually normalize in 5-7 days  . Long-term BP goal normotensive  Hyperlipidemia  Home Lipid lowering medication: Lipitor 20 mg daily  LDL 60, goal < 70  Current lipid lowering medication: Lipitor 40 mg daily  Continue statin at discharge  Diabetes  Home diabetic meds: insulin  Current diabetic meds: insulin  HgbA1c - pending, goal < 7.0 Recent Labs    02/08/20 2357  02/09/20 0530 02/09/20 0744  GLUCAP 221* 278* 294*    Other Stroke Risk Factors  Advanced age  Former cigarette smoker - quit  Hx stroke/TIA  Other Active Problems  Code status - DNR  CKD - stage 3b - creatinine - 1.70->1.40  Covid positive  Leukocytosis - 17.2 (temp 98.9)  Hyponatremia - [facility] day # 1  AMS with COVID and with initial R HP but now non focal with persistent stupor and mute. WU pending for possible large infarct but possible other etiologies. On dual antiplatelets.   To contact Stroke Continuity provider, please refer to [email or web address] After hours, contact General Neurology

## 2020-02-09 NOTE — Progress Notes (Signed)
NIHSS improving. Pt keenly alert and attempting to speak. Incomprehensible speech noted when hearing son on the phone. Strength in extremities improving. Vitals stable.

## 2020-02-09 NOTE — Progress Notes (Signed)
   02/09/20 0825  Family/Significant Other Communication  Family/Significant Other Update Called;Updated (Son- [place])

## 2020-02-09 NOTE — Plan of Care (Signed)
  Problem: Education: Goal: Knowledge of General Education information will improve Description: Including pain rating scale, medication(s)/side effects and non-pharmacologic comfort measures Outcome: Not Progressing   Problem: Health Behavior/Discharge Planning: Goal: Ability to manage health-related needs will improve Outcome: Not Progressing   Problem: Clinical Measurements: Goal: Ability to maintain clinical measurements within normal limits will improve Outcome: Not Progressing Goal: Will remain free from infection Outcome: Not Progressing Goal: Diagnostic test results will improve Outcome: Not Progressing Goal: Respiratory complications will improve Outcome: Not Progressing Goal: Cardiovascular complication will be avoided Outcome: Not Progressing   Problem: Activity: Goal: Risk for activity intolerance will decrease Outcome: Not Progressing   Problem: Nutrition: Goal: Adequate nutrition will be maintained Outcome: Not Progressing   Problem: Coping: Goal: Level of anxiety will decrease Outcome: Not Progressing   Problem: Elimination: Goal: Will not experience complications related to bowel motility Outcome: Not Progressing Goal: Will not experience complications related to urinary retention Outcome: Not Progressing   Problem: Pain Managment: Goal: General experience of comfort will improve Outcome: Not Progressing   Problem: Safety: Goal: Ability to remain free from injury will improve Outcome: Not Progressing   Problem: Skin Integrity: Goal: Risk for impaired skin integrity will decrease Outcome: Not Progressing   Problem: Education: Goal: Knowledge of risk factors and measures for prevention of condition will improve Outcome: Not Progressing   Problem: Coping: Goal: Psychosocial and spiritual needs will be supported Outcome: Not Progressing   Problem: Respiratory: Goal: Will maintain a patent airway Outcome: Not Progressing Goal: Complications  related to the disease process, condition or treatment will be avoided or minimized Outcome: Not Progressing   Problem: Education: Goal: Knowledge of disease or condition will improve Outcome: Not Progressing Goal: Knowledge of secondary prevention will improve Outcome: Not Progressing Goal: Knowledge of patient specific risk factors addressed and post discharge goals established will improve Outcome: Not Progressing   Problem: Coping: Goal: Will verbalize positive feelings about self Outcome: Not Progressing Goal: Will identify appropriate support needs Outcome: Not Progressing   Problem: Health Behavior/Discharge Planning: Goal: Ability to manage health-related needs will improve Outcome: Not Progressing   Problem: Self-Care: Goal: Ability to participate in self-care as condition permits will improve Outcome: Not Progressing Goal: Verbalization of feelings and concerns over difficulty with self-care will improve Outcome: Not Progressing Goal: Ability to communicate needs accurately will improve Outcome: Not Progressing   Problem: Nutrition: Goal: Risk of aspiration will decrease Outcome: Not Progressing Goal: Dietary intake will improve Outcome: Not Progressing    Pt is currently not interactive and unable to actively and meaningfully participate in plan of care.

## 2020-02-10 ENCOUNTER — Inpatient Hospital Stay (HOSPITAL_COMMUNITY): Payer: Medicare Other

## 2020-02-10 DIAGNOSIS — R7989 Other specified abnormal findings of blood chemistry: Secondary | ICD-10-CM

## 2020-02-10 LAB — COMPREHENSIVE METABOLIC PANEL
ALT: 11 U/L (ref 0–44)
AST: 17 U/L (ref 15–41)
Albumin: 2.5 g/dL — ABNORMAL LOW (ref 3.5–5.0)
Alkaline Phosphatase: 60 U/L (ref 38–126)
Anion gap: 10 (ref 5–15)
BUN: 30 mg/dL — ABNORMAL HIGH (ref 8–23)
CO2: 23 mmol/L (ref 22–32)
Calcium: 10.5 mg/dL — ABNORMAL HIGH (ref 8.9–10.3)
Chloride: 111 mmol/L (ref 98–111)
Creatinine, Ser: 1.9 mg/dL — ABNORMAL HIGH (ref 0.44–1.00)
GFR calc Af Amer: 28 mL/min — ABNORMAL LOW (ref >60)
GFR calc non Af Amer: 24 mL/min — ABNORMAL LOW (ref >60)
Glucose, Bld: 99 mg/dL (ref 70–99)
Potassium: 3 mmol/L — ABNORMAL LOW (ref 3.5–5.1)
Sodium: 144 mmol/L (ref 135–145)
Total Bilirubin: 0.3 mg/dL (ref 0.3–1.2)
Total Protein: 5.8 g/dL — ABNORMAL LOW (ref 6.5–8.1)

## 2020-02-10 LAB — CBC WITH DIFFERENTIAL/PLATELET
Abs Immature Granulocytes: 0.07 10*3/uL (ref 0.00–0.07)
Basophils Absolute: 0 10*3/uL (ref 0.0–0.1)
Basophils Relative: 0 %
Eosinophils Absolute: 0 10*3/uL (ref 0.0–0.5)
Eosinophils Relative: 0 %
HCT: 31.9 % — ABNORMAL LOW (ref 36.0–46.0)
Hemoglobin: 10.9 g/dL — ABNORMAL LOW (ref 12.0–15.0)
Immature Granulocytes: 1 %
Lymphocytes Relative: 17 %
Lymphs Abs: 1.9 10*3/uL (ref 0.7–4.0)
MCH: 31.1 pg (ref 26.0–34.0)
MCHC: 34.2 g/dL (ref 30.0–36.0)
MCV: 91.1 fL (ref 80.0–100.0)
Monocytes Absolute: 0.9 10*3/uL (ref 0.1–1.0)
Monocytes Relative: 8 %
Neutro Abs: 8.3 10*3/uL — ABNORMAL HIGH (ref 1.7–7.7)
Neutrophils Relative %: 74 %
Platelets: 232 10*3/uL (ref 150–400)
RBC: 3.5 MIL/uL — ABNORMAL LOW (ref 3.87–5.11)
RDW: 12.8 % (ref 11.5–15.5)
WBC: 11.3 10*3/uL — ABNORMAL HIGH (ref 4.0–10.5)
nRBC: 0 % (ref 0.0–0.2)

## 2020-02-10 LAB — BASIC METABOLIC PANEL
Anion gap: 12 (ref 5–15)
BUN: 28 mg/dL — ABNORMAL HIGH (ref 8–23)
CO2: 19 mmol/L — ABNORMAL LOW (ref 22–32)
Calcium: 9.8 mg/dL (ref 8.9–10.3)
Chloride: 109 mmol/L (ref 98–111)
Creatinine, Ser: 1.57 mg/dL — ABNORMAL HIGH (ref 0.44–1.00)
GFR calc Af Amer: 35 mL/min — ABNORMAL LOW (ref >60)
GFR calc non Af Amer: 30 mL/min — ABNORMAL LOW (ref >60)
Glucose, Bld: 61 mg/dL — ABNORMAL LOW (ref 70–99)
Potassium: 3.5 mmol/L (ref 3.5–5.1)
Sodium: 140 mmol/L (ref 135–145)

## 2020-02-10 LAB — D-DIMER, QUANTITATIVE: D-Dimer, Quant: 2.24 ug/mL-FEU — ABNORMAL HIGH (ref 0.00–0.50)

## 2020-02-10 LAB — GLUCOSE, CAPILLARY
Glucose-Capillary: 194 mg/dL — ABNORMAL HIGH (ref 70–99)
Glucose-Capillary: 58 mg/dL — ABNORMAL LOW (ref 70–99)
Glucose-Capillary: 70 mg/dL (ref 70–99)
Glucose-Capillary: 74 mg/dL (ref 70–99)
Glucose-Capillary: 77 mg/dL (ref 70–99)
Glucose-Capillary: 84 mg/dL (ref 70–99)
Glucose-Capillary: 91 mg/dL (ref 70–99)
Glucose-Capillary: 99 mg/dL (ref 70–99)

## 2020-02-10 LAB — FERRITIN: Ferritin: 887 ng/mL — ABNORMAL HIGH (ref 11–307)

## 2020-02-10 LAB — C-REACTIVE PROTEIN: CRP: 2.5 mg/dL — ABNORMAL HIGH (ref <1.0)

## 2020-02-10 MED ORDER — AMLODIPINE BESYLATE 10 MG PO TABS: 10.0000 mg | ORAL_TABLET | Freq: Every day | ORAL | Status: DC

## 2020-02-10 MED ORDER — AMLODIPINE BESYLATE 5 MG PO TABS
5.0000 mg | ORAL_TABLET | Freq: Every day | ORAL | Status: DC
  Administered 2020-02-10: 5 mg via ORAL

## 2020-02-10 MED ORDER — CARVEDILOL 25 MG PO TABS: 25.0000 mg | ORAL_TABLET | Freq: Two times a day (BID) | ORAL | Status: DC

## 2020-02-10 MED ORDER — CARVEDILOL 12.5 MG PO TABS
12.5000 mg | ORAL_TABLET | Freq: Two times a day (BID) | ORAL | Status: DC
  Administered 2020-02-10: 12.5 mg via ORAL
  Filled 2020-02-10: qty 1

## 2020-02-10 MED ORDER — ASPIRIN EC 81 MG PO TBEC
81.0000 mg | DELAYED_RELEASE_TABLET | Freq: Every day | ORAL | Status: DC
  Administered 2020-02-11 – 2020-02-14 (×4): 81 mg via ORAL
  Filled 2020-02-10 (×4): qty 1

## 2020-02-10 MED ORDER — AMLODIPINE BESYLATE 10 MG PO TABS
10.0000 mg | ORAL_TABLET | Freq: Every day | ORAL | Status: DC
  Administered 2020-02-11 – 2020-02-14 (×4): 10 mg via ORAL
  Filled 2020-02-10 (×5): qty 1

## 2020-02-10 MED ORDER — HYDRALAZINE HCL 20 MG/ML IJ SOLN
5.0000 mg | Freq: Four times a day (QID) | INTRAMUSCULAR | Status: DC | PRN
  Administered 2020-02-10: 5 mg via INTRAVENOUS
  Filled 2020-02-10: qty 1

## 2020-02-10 MED ORDER — LEVETIRACETAM 250 MG PO TABS
250.0000 mg | ORAL_TABLET | Freq: Two times a day (BID) | ORAL | Status: DC
  Administered 2020-02-10 – 2020-02-14 (×9): 250 mg via ORAL
  Filled 2020-02-10 (×9): qty 1

## 2020-02-10 MED ORDER — POTASSIUM CHLORIDE 10 MEQ/100ML IV SOLN
10.0000 meq | INTRAVENOUS | Status: AC
  Administered 2020-02-10 (×2): 10 meq via INTRAVENOUS
  Filled 2020-02-10 (×2): qty 100

## 2020-02-10 NOTE — Progress Notes (Signed)
PROGRESS NOTE                                                                                                                                                                                                             Patient Demographics:    Monique Cabrera, is a 85 y.o. female, DOB - [birth year], EGB:[id number]  Admit date - 02/08/2020   Admitting Physician Karmen Bongo, MD  Outpatient Primary MD for the patient is [name], DO  LOS - 2   Chief Complaint  Patient presents with  . Code Stroke       Brief Narrative    85 y.o. female with medical history significant of HTN; DM; CVA (2018); PAD s/p R BKA (10/2019); HLD; HTN; and stage 2 CKD presenting with unresponsiveness and right-sided weakness  , concerning for CVA.  She is obtunded and essentially unresponsive and so unable to answer questions.  Patient was seen by neurology, with clinical suspicion for CVA, admitted for CVA work-up, MRI done on 2/14 a.m., with no evidence of acute CVA, as he has been following regarding encephalopathic, or current recommendation is for 24 hours EEG monitor.   Subjective:    [name] today she is more interactive, but still far from her baseline , as discussed with staff no events overnight .   Assessment  & Plan :    Principal Problem:   Acute metabolic encephalopathy Active Problems:   Peripheral arterial disease (HCC)   Hypertension   Type II diabetes mellitus with renal manifestations, uncontrolled (HCC)   Hyperlipidemia   COVID-19 virus infection   Acute kidney injury superimposed on CKD (HCC)   Acute neurological deficits/acute encephalopathy -Management per neurology, initial evaluation highly suspicious for acute CVA.CT head with no acute intracranial abnormalities. CTA head and neck with no high-grade stenosis or occlusion.  No indication for carotid Dopplers. -MRI head with no evidence of acute CVA. -So far no clear etiology of  her encephalopathy, discussed with neurology, possibly related to seizures followed by Todd's paralysis given her right-sided weakness on presentation, plan for 24-hour video EEG given routine EEG with no evidence of epileptiform activity. -Hypertensive encephalopathy, DKA encephalopathy, COVID-19 encephalopathy could be contributing factors as well. -Started on low-dose Keppra. -Continue with aspirin and Plavix -She does appear to be more awake today, SLP consulted  COVID-19 infection  -  So far no hypoxia, no indication for steroids -Continue with IV remdesivir. -Dimers elevated at 4.5 on presentation, will check venous Doppler.  HTN -CVA is ruled out, so we will resume gradual in antihypertensive regimen, resume 110 of Norvasc, and half dose Coreg. -Continue to hold Diovan/hydrochlorothiazide given AKI.  HLD -LDL is 153, resume statin when able to take oral  DM with mild DKA on admission -Patient is known to be diabetic, anion gap elevated 18 on presentation, with evidence of ketonuria and hyperglycemia.  Anion gap appears to be currently normalized. -Have lowered her Lantus given hypoglycemia, continue with a sliding scale. -Appears to be poorly controlled with hemoglobin A1c of 11  PAD -s/p R BKA with good stump healing  AKI on Stage 3a CKD -Likely associated with COVID-19 infection -Since the patient is too obtunded to take PO right now, will start judicious IVF  Hypokalemia -Repleted     COVID-19 Labs  Recent Labs    02/08/20 1530 02/09/20 1242 02/10/20 0435  DDIMER 4.48* 3.40* 2.24*  FERRITIN 1,248* 1,091* 887*  LDH 205*  --   --   CRP 1.5* 3.8* 2.5*    Lab Results  Component Value Date   SARSCOV2NAA POSITIVE (A) 02/08/2020   [place] NEGATIVE 10/29/2019     Code Status : DNR  Family Communication  : Tried to reach son Mollie [place] [phone], left a voicemail  Disposition Plan  : Pending PT/OT/SLP evaluation  Barriers For Discharge :  Patient still having active CVA work-up, remains altered, unable to take an oral intake.  Consults  :  Neurology  Procedures  : None  DVT Prophylaxis  :  [place] lovenox  Lab Results  Component Value Date   PLT 232 02/10/2020    Antibiotics  :    Anti-infectives (From admission, onward)   Start     Dose/Rate Route Frequency Ordered Stop   02/09/20 1000  remdesivir 100 mg in sodium chloride 0.9 % 100 mL IVPB     100 mg 200 mL/hr over 30 Minutes Intravenous Daily 02/08/20 1924 02/13/20 0959   02/08/20 2000  remdesivir 200 mg in sodium chloride 0.9% 250 mL IVPB     200 mg 580 mL/hr over 30 Minutes Intravenous Once 02/08/20 1924 02/08/20 2312        Objective:   Vitals:   02/10/20 0700 02/10/20 0800 02/10/20 0900 02/10/20 0955  BP:    (!) 196/90  Pulse: 66 67 71 78  Resp: 17 (!) 23 19 18   Temp:    97.9 F (36.6 C)  TempSrc:    Axillary  SpO2: 99% 97% 98% 98%  Weight:      Height:        Wt Readings from Last 3 Encounters:  02/09/20 49.8 kg  01/24/20 60.3 kg  12/04/19 60.3 kg     Intake/Output Summary (Last 24 hours) at 02/10/2020 1327 Last data filed at 02/10/2020 0043 Gross per 24 hour  Intake 3 ml  Output --  Net 3 ml     Physical Exam  Patient is more awake today, minimally communicative, but she still altered, confused, does not follow commands or answer any questions . Symmetrical Chest wall movement, Good air movement bilaterally, CTAB RRR,No Gallops,Rubs or new Murmurs, No Parasternal Heave +ve B.Sounds, Abd Soft, No tenderness, No rebound - guarding or rigidity. No Cyanosis, Clubbing or edema, No new Rash or bruise  The patient left the office before the visit was finished. BKA    Data Review:  CBC Recent Labs  Lab 02/08/20 0950 02/08/20 0953 02/09/20 1242 02/10/20 0435  WBC 17.2*  --  14.3* 11.3*  HGB 12.5 12.9 12.2 10.9*  HCT 36.5 38.0 35.0* 31.9*  PLT 309  --  282 232  MCV 90.3  --  88.4 91.1  MCH 30.9  --  30.8 31.1  MCHC 34.2  --   34.9 34.2  RDW 12.4  --  12.5 12.8  LYMPHSABS 1.2  --  1.5 1.9  MONOABS 0.8  --  1.1* 0.9  EOSABS 0.0  --  0.0 0.0  BASOSABS 0.0  --  0.0 0.0    Chemistries  Recent Labs  Lab 02/08/20 0950 02/08/20 0953 02/09/20 1242 02/10/20 0435  NA 132* 131* 140 144  K 4.0 4.0 3.3* 3.0*  CL 93* 95* 101 111  CO2 21*  --  24 23  GLUCOSE 443* 446* 223* 99  BUN 21 23 31* 30*  CREATININE 1.70* 1.40* 1.80* 1.90*  CALCIUM 10.8*  --  10.8* 10.5*  AST 22  --  18 17  ALT 17  --  13 11  ALKPHOS 69  --  62 60  BILITOT 1.2  --  0.5 0.3   ------------------------------------------------------------------------------------------------------------------ Recent Labs    02/09/20 1242  CHOL 242*  HDL 69  LDLCALC 153*  TRIG 98  CHOLHDL 3.5    Lab Results  Component Value Date   HGBA1C 11.0 (H) 02/09/2020   ------------------------------------------------------------------------------------------------------------------ No results for input(s): TSH, T4TOTAL, T3FREE, THYROIDAB in the last 72 hours.  Invalid input(s): FREET3 ------------------------------------------------------------------------------------------------------------------ Recent Labs    02/09/20 1242 02/10/20 0435  FERRITIN 1,091* 887*    Coagulation profile Recent Labs  Lab 02/08/20 0950  INR 1.0    Recent Labs    02/09/20 1242 02/10/20 0435  DDIMER 3.40* 2.24*    Cardiac Enzymes No results for input(s): CKMB, TROPONINI, MYOGLOBIN in the last 168 hours.  Invalid input(s): CK ------------------------------------------------------------------------------------------------------------------ No results found for: BNP  Inpatient Medications  Scheduled Meds: . amLODipine  5 mg Oral Daily  . aspirin  300 mg Rectal Daily  . atorvastatin  40 mg Oral q1800  . carvedilol  12.5 mg Oral BID WC  . clopidogrel  75 mg Oral Daily  . enoxaparin (LOVENOX) injection  30 mg Subcutaneous Q24H  . insulin aspart  0-9 Units  Subcutaneous Q4H  . levETIRAcetam  250 mg Oral BID  . sodium chloride flush  3 mL Intravenous Q12H   Continuous Infusions: . dextrose 5 % and 0.9 % NaCl with KCl 40 mEq/L 100 mL/hr at 02/10/20 0942  . remdesivir 100 mg in NS 100 mL 100 mg (02/10/20 1109)   PRN Meds:.acetaminophen **OR** acetaminophen (TYLENOL) oral liquid 160 mg/5 mL **OR** acetaminophen, dextrose, hydrALAZINE, ondansetron (ZOFRAN) IV, senna-docusate  Micro Results Recent Results (from the past 240 hour(s))  Respiratory Panel by RT PCR (Flu A&B, Covid) - Nasopharyngeal Swab     Status: Abnormal   Collection Time: 02/08/20  9:45 AM   Specimen: Nasopharyngeal Swab  Result Value Ref Range Status   SARS Coronavirus 2 by RT PCR POSITIVE (A) NEGATIVE Final    Comment: RESULT CALLED TO, READ BACK BY AND VERIFIED WITH: A. Florene Glen RN 11:50 02/08/20 ([email or web address]) (NOTE) SARS-CoV-2 target nucleic acids are DETECTED. SARS-CoV-2 RNA is generally detectable in upper respiratory specimens  during the acute phase of infection. Positive results are indicative of the presence of the identified virus, but do not rule out bacterial infection or co-infection with other pathogens  not detected by the test. Clinical correlation with patient history and other diagnostic information is necessary to determine patient infection status. The expected result is Negative. Fact Sheet for Patients:  PinkCheek.be Fact Sheet for Healthcare Providers: GravelBags.it This test is not yet approved or cleared by the [place] FDA and  has been authorized for detection and/or diagnosis of SARS-CoV-2 by FDA under an Emergency Use Authorization (EUA).  This EUA will remain in effect (meaning this test can be used)  for the duration of  the COVID-19 declaration under Section 564(b)(1) of the Act, 21 U.S.C. section 360bbb-3(b)(1), unless the authorization is terminated or revoked sooner.    Influenza  A by PCR NEGATIVE NEGATIVE Final   Influenza B by PCR NEGATIVE NEGATIVE Final    Comment: (NOTE) The Xpert Xpress SARS-CoV-2/FLU/RSV assay is intended as an aid in  the diagnosis of influenza from Nasopharyngeal swab specimens and  should not be used as a sole basis for treatment. Nasal washings and  aspirates are unacceptable for Xpert Xpress SARS-CoV-2/FLU/RSV  testing. Fact Sheet for Patients: PinkCheek.be Fact Sheet for Healthcare Providers: GravelBags.it This test is not yet approved or cleared by the [place] FDA and  has been authorized for detection and/or diagnosis of SARS-CoV-2 by  FDA under an Emergency Use Authorization (EUA). This EUA will remain  in effect (meaning this test can be used) for the duration of the  Covid-19 declaration under Section 564(b)(1) of the Act, 21  U.S.C. section 360bbb-3(b)(1), unless the authorization is  terminated or revoked. Performed at [facility], [place] [address] [place], [place] [address]     Radiology Reports CT ANGIO HEAD W OR WO CONTRAST  Addendum Date: 02/08/2020   ADDENDUM REPORT: 02/08/2020 14:54 ADDENDUM: A 2 mm left MCA bifurcation aneurysm is stable from the prior exam. This is noted on further inquiry for explanation of right-sided symptoms. This is unlikely to be related to the patient's symptoms. Electronically Signed   By: San Morelle M.D.   On: 02/08/2020 14:54   Result Date: 02/08/2020 CLINICAL DATA:  Right-sided weakness. EXAM: CT ANGIOGRAPHY HEAD AND NECK TECHNIQUE: Multidetector CT imaging of the head and neck was performed using the standard protocol during bolus administration of intravenous contrast. Multiplanar CT image reconstructions and MIPs were obtained to evaluate the vascular anatomy. Carotid stenosis measurements (when applicable) are obtained utilizing NASCET criteria, using the distal internal carotid diameter as the denominator.  CONTRAST:  1mL OMNIPAQUE IOHEXOL 350 MG/ML SOLN COMPARISON:  None. FINDINGS: CTA NECK FINDINGS Aortic arch: Atherosclerotic calcifications are present within the aortic arch and at the origins the great vessels without significant stenosis or change. There is no aneurysm. Right carotid system: Right common carotid artery is within normal limits. Bifurcation is somewhat obscured by patient motion. No significant stenosis is present. Mild tortuosity is present in the cervical right ICA without significant stenosis. Left carotid system: Left common carotid artery is tortuous. Atherosclerotic changes are present at the bifurcation without significant stenosis. There is mild tortuosity of the cervical left ICA without significant stenosis. Vertebral arteries: The left vertebral artery is the dominant vessel. Both vertebral arteries originate from the subclavian arteries without significant stenosis. There is no definite stenosis in the neck. Skeleton: Degenerative changes are present lower cervical spine. Osseous structures are somewhat distorted by patient motion. No acute abnormalities are present. Other neck: The soft tissues the neck are otherwise unremarkable. Upper chest: Centrilobular emphysematous changes are present. No nodule or mass lesion is present.  Thoracic inlet is normal. Review of the MIP images confirms the above findings CTA HEAD FINDINGS Anterior circulation: Atherosclerotic changes are present within the cavernous internal carotid arteries bilaterally without significant stenosis. Mild narrowing is present in the proximal right A1 segment. No emergent large vessel occlusion is present. There is some attenuation of distal small vessels. Posterior circulation: The left vertebral artery is the dominant vessel. No significant stenosis is present. There is mild narrowing the mid basilar artery. Both posterior cerebral arteries originate from basilar tip. Distal vessel attenuation is present without a  significant proximal stenosis or occlusion. Venous sinuses: The dural sinuses patent. The straight sinus and deep cerebral veins are intact. Cortical veins are distorted by patient motion. Anatomic variants: None Review of the MIP images confirms the above findings IMPRESSION: 1. No emergent large vessel occlusion. 2. Atherosclerotic changes at the aortic arch, left carotid bifurcation, and cavernous internal carotid arteries bilaterally without significant stenosis. 3. Mild narrowing of the proximal right A1 segment. 4. Distal small vessel attenuation without a significant proximal stenosis, aneurysm, or branch vessel occlusion within the Circle of Willis. These results were called by telephone at the time of interpretation on 02/08/2020 at 10:05am to provider Dr. Lorraine Lax, Who verbally acknowledged these results. Electronically Signed: By: San Morelle M.D. On: 02/08/2020 10:34   CT Angio Neck W and/or Wo Contrast  Addendum Date: 02/08/2020   ADDENDUM REPORT: 02/08/2020 14:54 ADDENDUM: A 2 mm left MCA bifurcation aneurysm is stable from the prior exam. This is noted on further inquiry for explanation of right-sided symptoms. This is unlikely to be related to the patient's symptoms. Electronically Signed   By: San Morelle M.D.   On: 02/08/2020 14:54   Result Date: 02/08/2020 CLINICAL DATA:  Right-sided weakness. EXAM: CT ANGIOGRAPHY HEAD AND NECK TECHNIQUE: Multidetector CT imaging of the head and neck was performed using the standard protocol during bolus administration of intravenous contrast. Multiplanar CT image reconstructions and MIPs were obtained to evaluate the vascular anatomy. Carotid stenosis measurements (when applicable) are obtained utilizing NASCET criteria, using the distal internal carotid diameter as the denominator. CONTRAST:  1mL OMNIPAQUE IOHEXOL 350 MG/ML SOLN COMPARISON:  None. FINDINGS: CTA NECK FINDINGS Aortic arch: Atherosclerotic calcifications are present within the  aortic arch and at the origins the great vessels without significant stenosis or change. There is no aneurysm. Right carotid system: Right common carotid artery is within normal limits. Bifurcation is somewhat obscured by patient motion. No significant stenosis is present. Mild tortuosity is present in the cervical right ICA without significant stenosis. Left carotid system: Left common carotid artery is tortuous. Atherosclerotic changes are present at the bifurcation without significant stenosis. There is mild tortuosity of the cervical left ICA without significant stenosis. Vertebral arteries: The left vertebral artery is the dominant vessel. Both vertebral arteries originate from the subclavian arteries without significant stenosis. There is no definite stenosis in the neck. Skeleton: Degenerative changes are present lower cervical spine. Osseous structures are somewhat distorted by patient motion. No acute abnormalities are present. Other neck: The soft tissues the neck are otherwise unremarkable. Upper chest: Centrilobular emphysematous changes are present. No nodule or mass lesion is present. Thoracic inlet is normal. Review of the MIP images confirms the above findings CTA HEAD FINDINGS Anterior circulation: Atherosclerotic changes are present within the cavernous internal carotid arteries bilaterally without significant stenosis. Mild narrowing is present in the proximal right A1 segment. No emergent large vessel occlusion is present. There is some attenuation of distal small vessels.  Posterior circulation: The left vertebral artery is the dominant vessel. No significant stenosis is present. There is mild narrowing the mid basilar artery. Both posterior cerebral arteries originate from basilar tip. Distal vessel attenuation is present without a significant proximal stenosis or occlusion. Venous sinuses: The dural sinuses patent. The straight sinus and deep cerebral veins are intact. Cortical veins are  distorted by patient motion. Anatomic variants: None Review of the MIP images confirms the above findings IMPRESSION: 1. No emergent large vessel occlusion. 2. Atherosclerotic changes at the aortic arch, left carotid bifurcation, and cavernous internal carotid arteries bilaterally without significant stenosis. 3. Mild narrowing of the proximal right A1 segment. 4. Distal small vessel attenuation without a significant proximal stenosis, aneurysm, or branch vessel occlusion within the Circle of Willis. These results were called by telephone at the time of interpretation on 02/08/2020 at 10:05am to provider Dr. Lorraine Lax, Who verbally acknowledged these results. Electronically Signed: By: San Morelle M.D. On: 02/08/2020 10:34   MR BRAIN WO CONTRAST  Result Date: 02/10/2020 CLINICAL DATA:  Acute presentation with worsened level of consciousness and speech disturbance. EXAM: MRI HEAD WITHOUT CONTRAST TECHNIQUE: Multiplanar, multiecho pulse sequences of the brain and surrounding structures were obtained without intravenous contrast. COMPARISON:  CT studies 02/08/2020 FINDINGS: Brain: Diffusion imaging does not show any acute or subacute infarction. Chronic small-vessel ischemic changes affect the pons. No focal cerebellar insult. Cerebral hemispheres show generalized atrophy with mild chronic small-vessel change of the white matter considering age. No cortical or large vessel territory infarction. No mass lesion, hemorrhage, hydrocephalus or extra-axial collection. Vascular: Major vessels at the base of the brain show flow. Skull and upper cervical spine: Negative Sinuses/Orbits: Clear/normal Other: None IMPRESSION: No acute finding. Age related atrophy. Chronic small-vessel ischemic changes of the pons and cerebral hemispheric white matter, fairly mild considering age. Electronically Signed   By: Nelson Chimes M.D.   On: 02/10/2020 03:55   DG CHEST PORT 1 VIEW  Result Date: 02/08/2020 CLINICAL DATA:  COVID-19  positive, altered mental status EXAM: PORTABLE CHEST 1 VIEW COMPARISON:  Radiograph 10/04/2019 FINDINGS: Coarse interstitial changes are similar to priors. No consolidation, features of edema, pneumothorax, or effusion. Pulmonary vascularity is normally distributed. The aorta is calcified. The remaining cardiomediastinal contours are unremarkable. No acute osseous or soft tissue abnormality. No acute osseous or soft tissue abnormality. IMPRESSION: No acute cardiopulmonary findings. Coarse interstitial changes are similar to priors. Aortic Atherosclerosis (ICD10-I70.0). Electronically Signed   By: Lovena Le M.D.   On: 02/08/2020 19:47   DG Abd Portable 1V  Result Date: 02/09/2020 CLINICAL DATA:  Metal screening prior to MRI. EXAM: PORTABLE ABDOMEN - 1 VIEW COMPARISON:  CT abdomen and pelvis 08/21/2012 FINDINGS: No radiopaque foreign body is identified in the abdomen or pelvis with incomplete imaging of the inferior pelvic ring. No dilated loops of bowel are seen to suggest obstruction. Atherosclerotic vascular calcifications are noted. The visualized lung bases are grossly clear. IMPRESSION: No metallic foreign body identified. Electronically Signed   By: Logan Bores M.D.   On: 02/09/2020 10:04   EEG adult  Result Date: 02/08/2020 Lora Havens, MD     02/08/2020  1:32 PM Patient Name: AEVAH STANSBERY MRN: [id number] Epilepsy Attending: Lora Havens Referring Physician/Provider: Laurey Morale, NP Date: 02/08/2020 Duration: 36.20 minutes Patient history: 85 year old female with prior medical history of stroke who presented with unresponsiveness and right-sided weakness.  CT head negative for hemorrhage.  EEG evaluate for seizures. Level of alertness: Lethargic AEDs during  EEG study: None Technical aspects: This EEG study was done with scalp electrodes positioned according to the 10-20 International system of electrode placement. Electrical activity was acquired at a sampling rate of 500Hz  and  reviewed with a high frequency filter of 70Hz  and a low frequency filter of 1Hz . EEG data were recorded continuously and digitally stored. Description: EEG showed continuous generalized low amplitude 2 to 3 Hz delta slowing as well as 13 to 15 Hz frontocentral beta activity.  Hyperventilation and photic stimulation were not performed. Of note in the beginning, study was technically difficult due to significant myogenic artifact. Abnormality -Continuous slow, generalized IMPRESSION: This study is suggestive of moderate to severe diffuse encephalopathy, nonspecific etiology. No seizures or epileptiform discharges were seen throughout the recording. Lora Havens   CT HEAD CODE STROKE WO CONTRAST  Result Date: 02/08/2020 CLINICAL DATA:  Code stroke. Neuro deficit, acute, stroke suspected. EXAM: CT HEAD WITHOUT CONTRAST TECHNIQUE: Contiguous axial images were obtained from the base of the skull through the vertex without intravenous contrast. COMPARISON:  None. FINDINGS: Brain: No acute infarct, hemorrhage, or mass lesion is present. Moderate generalized atrophy and white matter disease is stable. The ventricles are proportionate to the degree of atrophy. Basal ganglia and insular cortex is bilaterally. No acute or focal cortical abnormality is present. No significant extraaxial fluid collection is present. The brainstem and cerebellum are within normal limits. Vascular: Extensive vascular calcifications extend to the MCA bifurcations bilaterally without significant interval change. No other asymmetric hyperdense vessel is present. Dense calcifications in the basilar artery vertebral arteries are also stable. Skull: Insert normal skull No significant extracranial soft tissue lesion is present. Sinuses/Orbits: The paranasal sinuses and mastoid air cells are clear. Bilateral lens replacements are noted. Globes and orbits are otherwise unremarkable. Other: ASPECTS ([place] Stroke Program Early CT Score) - Ganglionic  level infarction (caudate, lentiform nuclei, internal capsule, insula, M1-M3 cortex): 3/3 - Supraganglionic infarction (M4-M6 cortex): 7/7 Total score (0-10 with 10 being normal): 10/10 IMPRESSION: 1. No acute intracranial abnormality or significant interval change. 2. Stable advanced atrophy and diffuse white matter disease. 3. Advanced calcific atherosclerotic changes extend to the MCA bifurcations and the basilar artery are stable. 4. ASPECTS is 10/10 1. The above was relayed via text pager to Dr. Lorraine Lax on 02/08/2020 at 10:03 . Electronically Signed   By: San Morelle M.D.   On: 02/08/2020 10:04   ECHOCARDIOGRAM LIMITED  Result Date: 02/09/2020    ECHOCARDIOGRAM LIMITED REPORT   Patient Name:   ARI ENGELBRECHT Date of Exam: 02/09/2020 Medical Rec #:  [id number]        Height:       65.0 in Accession #:    [id number]       Weight:       132.3 lb Date of Birth:  October 24, 1935        BSA:          1.66 m Patient Age:    53 years         BP:           209/93 mmHg Patient Gender: F                HR:           94 bpm. Exam Location:  Inpatient Procedure: Limited Echo and Color Doppler Indications:    CVA  History:        Patient has prior history of Echocardiogram examinations, most  recent 02/18/2017. Stroke and PAD; Risk Factors:Hypertension,                 Diabetes and Dyslipidemia.  Sonographer:    Dustin Flock Referring Phys: [place] Comments: Image acquisition challenging due to uncooperative patient and cant follow commands. IMPRESSIONS  1. Left ventricular ejection fraction, by estimation, is 60 to 65%. The left ventricle has normal function. The left ventricle has no regional wall motion abnormalities. Left ventricular diastolic function could not be evaluated.  2. Right ventricular systolic function is normal. The right ventricular size is normal.  3. The mitral valve is normal in structure and function. No evidence of mitral valve regurgitation. No  evidence of mitral stenosis.  4. The aortic valve is normal in structure and function. Aortic valve regurgitation is not visualized. Mild aortic valve sclerosis is present, with no evidence of aortic valve stenosis.  5. The inferior vena cava is normal in size with greater than 50% respiratory variability, suggesting right atrial pressure of 3 mmHg. Conclusion(s)/Recomendation(s): Limited study due to poor patient cooperation. FINDINGS  Left Ventricle: Left ventricular ejection fraction, by estimation, is 60 to 65%. The left ventricle has normal function. The left ventricle has no regional wall motion abnormalities. The left ventricular internal cavity size was normal in size. There is  no left ventricular hypertrophy. The left ventricular diastology could not be evaluated due to indeterminate diastolic function. Right Ventricle: The right ventricular size is normal. No increase in right ventricular wall thickness. Right ventricular systolic function is normal. Left Atrium: Left atrial size was normal in size. Right Atrium: Right atrial size was normal in size. Pericardium: There is no evidence of pericardial effusion. Mitral Valve: The mitral valve is normal in structure and function. Normal mobility of the mitral valve leaflets. No evidence of mitral valve stenosis. Tricuspid Valve: The tricuspid valve is normal in structure. Tricuspid valve regurgitation is not demonstrated. No evidence of tricuspid stenosis. Aortic Valve: The aortic valve is normal in structure and function. Aortic valve regurgitation is not visualized. Mild aortic valve sclerosis is present, with no evidence of aortic valve stenosis. Pulmonic Valve: The pulmonic valve was normal in structure. Pulmonic valve regurgitation is not visualized. No evidence of pulmonic stenosis. Aorta: The aortic root is normal in size and structure. Venous: The inferior vena cava is normal in size with greater than 50% respiratory variability, suggesting right  atrial pressure of 3 mmHg. IAS/Shunts: No atrial level shunt detected by color flow Doppler.  LEFT VENTRICLE PLAX 2D LVIDd:         3.27 cm Diastology LVIDs:         2.15 cm LV e' lateral: 6.31 cm/s LV PW:         1.07 cm LV e' medial:  3.48 cm/s LV IVS:        1.24 cm  Dani Gobble Croitoru MD Electronically signed by Sanda Klein MD Signature Date/Time: 02/09/2020/12:54:09 PM    Final       Phillips Climes M.D on 02/10/2020 at 1:27 PM  Between 7am to 7pm - Pager - [phone]  After 7pm go to [email or web address] - password [email or web address]  [facility] -  Office  [phone]

## 2020-02-10 NOTE — Progress Notes (Signed)
Pt off the unit. Transported to MRI.

## 2020-02-10 NOTE — Progress Notes (Addendum)
Pt arrived back on the unit from MRI. Pt is resting. VSS. Will continue to monitor.

## 2020-02-10 NOTE — Progress Notes (Signed)
Bilateral lower extremity venous duplex exam completed.  Preliminary results can be found under CV proc under chart review.  02/10/2020 3:33 PM  Mathias Bogacki, K., RDMS, RVT

## 2020-02-10 NOTE — Progress Notes (Signed)
Called Doyle Askew (daughter) to give an update on the pt. No answer.

## 2020-02-10 NOTE — Progress Notes (Addendum)
STROKE TEAM PROGRESS NOTE   HISTORY OF PRESENT ILLNESS (per record) VARVARA LEGAULT is an 85 y.o. female  With [place] Stroke (2018), DM 2, CKD 2, HTN who presented to [facility] ED as a code stroke for right side weakness, and unresponsiveness.  Per son who found her:  Patient was last talking/ communicating with him normally on Wednesday. She was able to communicate and refuse EMS on Thursday, but him and his sister both felt that she was not her normal self. He reports that since Wednesday she has been lying around in bed in the fetal position. She also ate 1 meal that day and maybe had a protein shake on Thursday. She was c/o of a HA yesterday which is why EMS was called but she refused transport to the hospital.   He last saw her at 2030 2/11. This morning at about 0900 he went and found the patient not talking and on the floor. EMS was called. At baseline patient's son does live with her " most days", but patient can maneuver around the house in her wheelchair. She is able to get herself in and out of the wheel chair, she cooks, and able to toilet herself.  On Sunday patient fell and hit her head on the night stand. According to EMS patient is only taking ASA. She had a BKA on the right knee in 2020. Patient was found to have been incontinent of bowel and bladder. ED course:  CTH: no hemorrhage CTA: no LVO BG: 424 BP: 140/78 01/2017:  Punctate acute infarct left para median along the upper floor of the fourth ventricle Date last known well:02/06/11 Time last known well:2030 ? Acting confused for 1- 2 days tPA Given: no; outside of window Modified Rankin: Rankin Score=2 NIHSS:21    INTERVAL HISTORY Presented with AMS and R HP. She still remains confused but more responsive.  The MRI is reviewed in person in shows marked volume loss. No acute findings are noted on DWI. There is minimal white matter changes and no encephalomalacia is noted.     OBJECTIVE Vitals:   02/10/20 0700 02/10/20 0800  02/10/20 0900 02/10/20 0955  BP:    (!) 196/90  Pulse: 66 67 71 78  Resp: 17 (!) 23 19 18   Temp:    97.9 F (36.6 C)  TempSrc:    Axillary  SpO2: 99% 97% 98% 98%  Weight:      Height:        CBC:  Recent Labs  Lab 02/09/20 1242 02/10/20 0435  WBC 14.3* 11.3*  NEUTROABS 11.6* 8.3*  HGB 12.2 10.9*  HCT 35.0* 31.9*  MCV 88.4 91.1  PLT 282 453    Basic Metabolic Panel:  Recent Labs  Lab 02/09/20 1242 02/10/20 0435  NA 140 144  K 3.3* 3.0*  CL 101 111  CO2 24 23  GLUCOSE 223* 99  BUN 31* 30*  CREATININE 1.80* 1.90*  CALCIUM 10.8* 10.5*    Lipid Panel:     Component Value Date/Time   CHOL 242 (H) 02/09/2020 1242   CHOL 204 (H) 11/24/2015 0938   TRIG 98 02/09/2020 1242   HDL 69 02/09/2020 1242   HDL 53 11/24/2015 0938   CHOLHDL 3.5 02/09/2020 1242   VLDL 20 02/09/2020 1242   LDLCALC 153 (H) 02/09/2020 1242   LDLCALC 60 02/07/2019 0900   HgbA1c:  Lab Results  Component Value Date   HGBA1C 11.0 (H) 02/09/2020   Urine Drug Screen: No results found for:  LABOPIA, COCAINSCRNUR, LABBENZ, AMPHETMU, THCU, LABBARB  Alcohol Level No results found for: ETH  IMAGING  CT ANGIO HEAD W OR WO CONTRAST CT Angio Neck W and/or Wo Contrast Addendum Date: 02/08/2020   ADDENDUM: A 2 mm left MCA bifurcation aneurysm is stable from the prior exam. This is noted on further inquiry for explanation of right-sided symptoms. This is unlikely to be related to the patient's symptoms.  02/08/2020 IMPRESSION:  1. No emergent large vessel occlusion.  2. Atherosclerotic changes at the aortic arch, left carotid bifurcation, and cavernous internal carotid arteries bilaterally without significant stenosis.  3. Mild narrowing of the proximal right A1 segment.  4. Distal small vessel attenuation without a significant proximal stenosis, aneurysm, or branch vessel occlusion within the Circle of Willis.   CT HEAD CODE STROKE WO CONTRAST 02/08/2020 IMPRESSION:  1. No acute intracranial  abnormality or significant interval change.  2. Stable advanced atrophy and diffuse white matter disease.  3. Advanced calcific atherosclerotic changes extend to the MCA bifurcations and the basilar artery are stable.  4. ASPECTS is 10/10   MRI WO Contrast  02/10/20 FINDINGS: Brain: Diffusion imaging does not show any acute or subacute infarction. Chronic small-vessel ischemic changes affect the pons. No focal cerebellar insult. Cerebral hemispheres show generalized atrophy with mild chronic small-vessel change of the white matter considering age. No cortical or large vessel territory infarction. No mass lesion, hemorrhage, hydrocephalus or extra-axial collection. Vascular: Major vessels at the base of the brain show flow. Skull and upper cervical spine: Negative Sinuses/Orbits: Clear/normal Other: None IMPRESSION: No acute finding. Age related atrophy. Chronic small-vessel ischemic changes of the pons and cerebral hemispheric white matter, fairly mild considering age.  DG CHEST PORT 1 VIEW 02/08/2020 IMPRESSION:  No acute cardiopulmonary findings. Coarse interstitial changes are similar to priors. Aortic Atherosclerosis (ICD10-I70.0).   EEG adult 02/08/2020 IMPRESSION:  This study is suggestive of moderate to severe diffuse encephalopathy, nonspecific etiology. No seizures or epileptiform discharges were seen throughout the recording.   Transthoracic Echocardiogram  02/09/2020 IMPRESSIONS  1. Left ventricular ejection fraction, by estimation, is 60 to 65%. The  left ventricle has normal function. The left ventricle has no regional  wall motion abnormalities. Left ventricular diastolic function could not  be evaluated.  2. Right ventricular systolic function is normal. The right ventricular  size is normal.  3. The mitral valve is normal in structure and function. No evidence of  mitral valve regurgitation. No evidence of mitral stenosis.  4. The aortic valve is normal in  structure and function. Aortic valve  regurgitation is not visualized. Mild aortic valve sclerosis is present,  with no evidence of aortic valve stenosis.  5. The inferior vena cava is normal in size with greater than 50%  respiratory variability, suggesting right atrial pressure of 3 mmHg. Conclusion(s)/Recomendation(s): Limited study due to poor patient cooperation.   ECG - ST rate 103 BPM. (See cardiology reading for complete details)  PHYSICAL EXAM Blood pressure (!) 196/90, pulse 78, temperature 97.9 F (36.6 C), temperature source Axillary, resp. rate 18, height 5\' 5"  (1.651 m), weight 49.8 kg, SpO2 98 %.    GENERAL:  She is more responsive.  HEENT: No trauma; supple  ABDOMEN: soft  EXTREMITIES: No edema R BKA; reaching for things  - suggestive of visual hallucinations  BACK: Normal  SKIN: Normal by inspection.    MENTAL STATUS:  She is awake he reports that she is doing okay. She follows some simple commands at times. No dysarthria  is noted. She is disoriented.  CRANIAL NERVES: Pupils are equal, round and reactive to light; extra ocular movements are full, there is no significant nystagmus; visual fields with possible reduce response to L threat, R fine; upper and lower facial muscles are normal in strength and symmetric, there is no flattening of the nasolabial folds.   MOTOR: Bulk and tone normal; moves all 4 vigorously   COORDINATION: No dysmetria; no tremos; no myclonus    REFLEXES: Deep tendon reflexes are symmetrical and normal  SENSATION: Normal to  pain.    ASSESSMENT/PLAN Ms. CHAUNDA VANDERGRIFF is a 85 y.o. female with history of stroke (2018), DM 2, PAD, Rt BKA, CKD 2, HTN and recent cofusion who presented to [facility] ED as a code stroke for right side weakness, and unresponsiveness.   She did not receive IV t-PA due to late presentation (>4.5 hours from time of onset)  Stroke:  MRI - No acute finding. Age related atrophy. Chronic small-vessel ischemic changes  of the pons and cerebral hemispheric white matter, fairly mild considering age.  Resultant  AMS - non focal exam  Code Stroke CT Head - No acute intracranial abnormality or significant interval change. Stable advanced atrophy and diffuse white matter disease. Advanced calcific atherosclerotic changes extend to the MCA bifurcations and the basilar artery are stable. ASPECTS is 10/10   CT head - not ordered  MRI head - No acute finding. Age related atrophy. Chronic small-vessel ischemic changes of the pons and cerebral hemispheric white matter, fairly mild considering age.  MRA head - not ordered  CTA H&N - no high grade stenosis or occlusion   CT Perfusion - not ordered  Carotid Doppler - CTA neck performed - carotid dopplers not indicated.  2D Echo - EF 60 - 65%. No cardiac source of emboli identified.   EEG - This study is suggestive of moderate to severe diffuse encephalopathy, nonspecific etiology. No seizures or epileptiform discharges were seen throughout the recording.   Sars Corona Virus 2 - positive  LDL - 60  HgbA1c - 11.0  UDS - not ordered  VTE prophylaxis - Lovenox Diet  Diet Order            Diet NPO time specified  Diet effective now              Aspirin 81 mg daily and Plavix 75 mg daily prior to admission, now on aspirin 81 mg daily and clopidogrel 75 mg daily  Patient counseled to be compliant with her antithrombotic medications  Ongoing aggressive stroke risk factor management  Therapy recommendations:  CIR vs SNF  Disposition:  Pending  Hypertension  Home BP meds: Coreg, Norvasc, Diovan HCT  Current BP meds: none  Blood pressure somewhat high at times but within post stroke/TIA parameters . Permissive hypertension (OK if < 220/120) but gradually normalize in 5-7 days  . Long-term BP goal normotensive  Hyperlipidemia  Home Lipid lowering medication: Lipitor 20 mg daily  LDL 60, goal < 70  Current lipid lowering medication: Lipitor  40 mg daily  Continue statin at discharge  Diabetes  Home diabetic meds: insulin  Current diabetic meds: insulin  HgbA1c - 11.0, goal < 7.0 Recent Labs    02/10/20 0132 02/10/20 0423 02/10/20 0738  GLUCAP 194* 99 77    Other Stroke Risk Factors  Advanced age  Former cigarette smoker - quit  Hx stroke/TIA  Other Active Problems  Code status - DNR  CKD - stage 3b - creatinine -  1.70->1.40->1.90  Covid positive  Leukocytosis - 17.2->14.3->11.3 (afebrile)  Hyponatremia - 131->144  Hypokalemia - 3.0  NPO  Hospital day # 2  AMS with COVID and with initial R HP but now non focal with persistent stupor.  Imaging does not reveal acute ischemic stroke or other parenchymal injuries. Given the initial focality and severe confusion, it is possible the patient may have had a Todd's paralysis after seizures with postictal confusion. Low-dose Keppra will be started and in 24 hour EEG obtained. On dual antiplatelets.  Extensive discussion with other specialties and review of imaging.   To contact Stroke Continuity provider, please refer to [email or web address] After hours, contact General Neurology

## 2020-02-10 NOTE — Progress Notes (Signed)
  Speech Language Pathology Treatment: Dysphagia  Patient Details Name: Monique Cabrera MRN: [id number] DOB: Jul 19, 1935 Today's Date: 02/10/2020 Time: 8889-1694 SLP Time Calculation (min) (ACUTE ONLY): 14 min  Assessment / Plan / Recommendation Clinical Impression  BSE order acknowledged.  Pt is already on caseload, therefore a ST treatment session was completed in place of a BSE.  Pt was encountered awake/alert and appeared to be more cooperative with PO trials in comparison to yesterday's BSE per chart review.  White coating was observed on the pt's lingual surface, concerning for thrush.  RN was made aware.  Pt reported that she consumes regular solids at home without difficulty when her dentures are in place, but her dentures are currently at home.  She consumed trials of thin liquid, puree, and soft solids.  Mastication and AP transport were prolonged with soft solid trials and trace oral residue was observed on the pt's lingual surface.  Residue cleared with an independent liquid wash.  No clinical s/sx of aspiration were observed with any trials despite challenging pt with consuming 3oz of water continuously.  Recommend initiation of Dysphagia 2 (fine chop) solids and thin liquids with medications administered whole in puree and intermittent supervision to cue for compensatory strategies (listed below).  SLP will f/u to monitor diet tolerance and to determine readiness for clinical diet upgrade.     HPI HPI: 85 yo female adm to [facility] with AMS, differential is COVID encephalopathy, CVA or seizure.  Pt PMH + for CVAs- one 02/18/17, DM2, Diabetic retinopathy, PAD, chronic hep B, herpetic neuralgia, gait abnormality, left gangrene s/p BKA in 10/2019.  MRI ordered for when able to conduct MRI w/ + COVID.  CT head negative for acute event, stable atrohy.      SLP Plan  Goals updated       Recommendations  Diet recommendations: Thin liquid;Dysphagia 2 (fine chop) Liquids provided via:  Cup;Straw Medication Administration: Whole meds with puree Supervision: Staff to assist with self feeding;Intermittent supervision to cue for compensatory strategies Compensations: Minimize environmental distractions;Slow rate;Small sips/bites Postural Changes and/or Swallow Maneuvers: Seated upright 90 degrees                Oral Care Recommendations: Oral care BID;Staff/trained caregiver to provide oral care Follow up Recommendations: Skilled Nursing facility SLP Visit Diagnosis: Dysphagia, oropharyngeal phase (R13.12) Plan: Goals updated       GO               Colin Mulders., M.S., [facility] Office: [phone]  [place] 02/10/2020, 2:30 PM

## 2020-02-11 ENCOUNTER — Encounter: Payer: Medicare Other | Admitting: Family

## 2020-02-11 DIAGNOSIS — Z8673 Personal history of transient ischemic attack (TIA), and cerebral infarction without residual deficits: Secondary | ICD-10-CM

## 2020-02-11 DIAGNOSIS — R569 Unspecified convulsions: Secondary | ICD-10-CM

## 2020-02-11 DIAGNOSIS — G9341 Metabolic encephalopathy: Secondary | ICD-10-CM

## 2020-02-11 LAB — CBC WITH DIFFERENTIAL/PLATELET
Abs Immature Granulocytes: 0.08 10*3/uL — ABNORMAL HIGH (ref 0.00–0.07)
Basophils Absolute: 0 10*3/uL (ref 0.0–0.1)
Basophils Relative: 1 %
Eosinophils Absolute: 0.5 10*3/uL (ref 0.0–0.5)
Eosinophils Relative: 7 %
HCT: 32.2 % — ABNORMAL LOW (ref 36.0–46.0)
Hemoglobin: 11.1 g/dL — ABNORMAL LOW (ref 12.0–15.0)
Immature Granulocytes: 1 %
Lymphocytes Relative: 22 %
Lymphs Abs: 1.5 10*3/uL (ref 0.7–4.0)
MCH: 31.2 pg (ref 26.0–34.0)
MCHC: 34.5 g/dL (ref 30.0–36.0)
MCV: 90.4 fL (ref 80.0–100.0)
Monocytes Absolute: 0.5 10*3/uL (ref 0.1–1.0)
Monocytes Relative: 8 %
Neutro Abs: 4.3 10*3/uL (ref 1.7–7.7)
Neutrophils Relative %: 61 %
Platelets: 228 10*3/uL (ref 150–400)
RBC: 3.56 MIL/uL — ABNORMAL LOW (ref 3.87–5.11)
RDW: 12.6 % (ref 11.5–15.5)
WBC: 6.9 10*3/uL (ref 4.0–10.5)
nRBC: 0 % (ref 0.0–0.2)

## 2020-02-11 LAB — COMPREHENSIVE METABOLIC PANEL
ALT: 14 U/L (ref 0–44)
AST: 21 U/L (ref 15–41)
Albumin: 2.5 g/dL — ABNORMAL LOW (ref 3.5–5.0)
Alkaline Phosphatase: 54 U/L (ref 38–126)
Anion gap: 8 (ref 5–15)
BUN: 19 mg/dL (ref 8–23)
CO2: 20 mmol/L — ABNORMAL LOW (ref 22–32)
Calcium: 9.6 mg/dL (ref 8.9–10.3)
Chloride: 112 mmol/L — ABNORMAL HIGH (ref 98–111)
Creatinine, Ser: 1.4 mg/dL — ABNORMAL HIGH (ref 0.44–1.00)
GFR calc Af Amer: 40 mL/min — ABNORMAL LOW (ref >60)
GFR calc non Af Amer: 35 mL/min — ABNORMAL LOW (ref >60)
Glucose, Bld: 103 mg/dL — ABNORMAL HIGH (ref 70–99)
Potassium: 3.7 mmol/L (ref 3.5–5.1)
Sodium: 140 mmol/L (ref 135–145)
Total Bilirubin: 1 mg/dL (ref 0.3–1.2)
Total Protein: 5.9 g/dL — ABNORMAL LOW (ref 6.5–8.1)

## 2020-02-11 LAB — GLUCOSE, CAPILLARY
Glucose-Capillary: 100 mg/dL — ABNORMAL HIGH (ref 70–99)
Glucose-Capillary: 121 mg/dL — ABNORMAL HIGH (ref 70–99)
Glucose-Capillary: 164 mg/dL — ABNORMAL HIGH (ref 70–99)
Glucose-Capillary: 90 mg/dL (ref 70–99)
Glucose-Capillary: 96 mg/dL (ref 70–99)
Glucose-Capillary: 97 mg/dL (ref 70–99)
Glucose-Capillary: 99 mg/dL (ref 70–99)

## 2020-02-11 LAB — FERRITIN: Ferritin: 819 ng/mL — ABNORMAL HIGH (ref 11–307)

## 2020-02-11 LAB — PHOSPHORUS: Phosphorus: 2.1 mg/dL — ABNORMAL LOW (ref 2.5–4.6)

## 2020-02-11 LAB — C-REACTIVE PROTEIN: CRP: 1.3 mg/dL — ABNORMAL HIGH (ref <1.0)

## 2020-02-11 LAB — D-DIMER, QUANTITATIVE: D-Dimer, Quant: 2.65 ug/mL-FEU — ABNORMAL HIGH (ref 0.00–0.50)

## 2020-02-11 LAB — MAGNESIUM: Magnesium: 1.2 mg/dL — ABNORMAL LOW (ref 1.7–2.4)

## 2020-02-11 MED ORDER — ATORVASTATIN CALCIUM 10 MG PO TABS
20.0000 mg | ORAL_TABLET | Freq: Every day | ORAL | Status: DC
  Administered 2020-02-11 – 2020-02-13 (×3): 20 mg via ORAL
  Filled 2020-02-11 (×3): qty 2

## 2020-02-11 MED ORDER — ENSURE ENLIVE PO LIQD
237.0000 mL | Freq: Three times a day (TID) | ORAL | Status: DC
  Administered 2020-02-12 – 2020-02-13 (×6): 237 mL via ORAL

## 2020-02-11 MED ORDER — CARVEDILOL 25 MG PO TABS
25.0000 mg | ORAL_TABLET | Freq: Two times a day (BID) | ORAL | Status: DC
  Administered 2020-02-11 – 2020-02-14 (×7): 25 mg via ORAL
  Filled 2020-02-11 (×7): qty 1

## 2020-02-11 MED ORDER — MAGNESIUM SULFATE 4 GM/100ML IV SOLN
4.0000 g | Freq: Once | INTRAVENOUS | Status: AC
  Administered 2020-02-11: 4 g via INTRAVENOUS
  Filled 2020-02-11: qty 100

## 2020-02-11 NOTE — Progress Notes (Signed)
Occupational Therapy Treatment Patient Details Name: Monique Cabrera MRN: [id number] DOB: May 03, 1935 Today's Date: 02/11/2020    History of present illness 85 yo female adm to [facility] with AMS, differential is COVID encephalopathy, CVA or seizure.  Pt PMH significant for stroke, DM2, Diabetic retinopathy, PAD, chronic hep B, herpetic neuralgia, gait abnormality, left gangrene s/p BKA in 10/2019.  MRI ordered for when able to conduct MRI w/ + COVID.  CT head negative for acute event.   OT comments  Pt progressing towards OT goals with improved attention and ability to follow directions during this session. On OT entry, pt with urine incontinence. Pt min guard for bed mobility to sit EOB with minor LOB noted and some impulsivity, attempting to stand without assistance. Pt Total A to don L grip sock and doff soiled brief. Pt Total A +2 for LB bathing for pericare after incontinence. Pt unable to safely stand with RW, so 2 person manual assist needed. Pt Max A for transfer to recliner. Pt Min A for donning new hospital gown while seated. Will continue to follow acutely.    Follow Up Recommendations  CIR;SNF;Supervision/Assistance - 24 hour    Equipment Recommendations  Other (comment)(defer to next venue)    Recommendations for Other Services      Precautions / Restrictions Precautions Precautions: Fall;Other (comment) Precaution Comments: R BKA Restrictions Weight Bearing Restrictions: No       Mobility Bed Mobility Overal bed mobility: Needs Assistance Bed Mobility: Supine to Sit     Supine to sit: Min guard     General bed mobility comments: Pt with minor LOB noted and unsteadiness with bed mobility, some safety concerns  Transfers Overall transfer level: Needs assistance Equipment used: Rolling walker (2 wheeled);None;1 person hand held assist Transfers: Sit to/from [facility] Sit to Stand: Total assist;+2 physical assistance Stand pivot transfers: Max  assist       General transfer comment: maxAx2 for sit>stand from bed, with blocking of R knee, once knee locked out pt able to hold onto/hug therapist and maintain static standing with min-modA while wet briefs were removed, sat back down, attempted to [employer] for additional standing for pericare and unable to attain upright, maxA for stand pivot transfer to recliner,     Balance Overall balance assessment: Needs assistance Sitting-balance support: Feet supported;No upper extremity supported Sitting balance-Leahy Scale: Fair Sitting balance - Comments: able to attain fair seated balance with close supervision   Standing balance support: Bilateral upper extremity supported;During functional activity Standing balance-Leahy Scale: Poor Standing balance comment: with knee locked out and holding onto therapist with B UE pt able to maintain static stand                           ADL either performed or assessed with clinical judgement   ADL Overall ADL's : Needs assistance/impaired             Lower Body Bathing: Sit to/from stand;+2 for physical assistance;Total assistance Lower Body Bathing Details (indicate cue type and reason): Pt Total for LB bathing with 2 person assist (1 to keep pt safely in standing and 2nd person to complete peri care) Upper Body Dressing : Minimal assistance Upper Body Dressing Details (indicate cue type and reason): Pt Min A for donning clean hospital gown Lower Body Dressing: Total assistance;+2 for physical assistance;Sit to/from stand Lower Body Dressing Details (indicate cue type and reason): Pt Total to doff soiled brief in  standing with 2 person assist, Total to don L sock             Functional mobility during ADLs: Total assistance;+2 for physical assistance;+2 for safety/equipment General ADL Comments: Pt Total A + 2 for LB ADLs in standing without AD. Pt unable to safely stand with RW, with 2 person assist indicated     Vision        Perception     Praxis      Cognition Arousal/Alertness: Awake/alert Behavior During Therapy: Restless Overall Cognitive Status: No family/caregiver present to determine baseline cognitive functioning Area of Impairment: Attention;Following commands;Orientation                 Orientation Level: Disoriented to;Situation;Place Current Attention Level: Selective   Following Commands: Follows one step commands inconsistently;Follows multi-step commands inconsistently       General Comments: pt slumped over in bed, but quickly aroused and interactive with therapy         Exercises     Shoulder Instructions       General Comments VSS on RA    Pertinent Vitals/ Pain       Pain Assessment: No/denies pain Faces Pain Scale: No hurt  Home Living       Type of Home: Apartment                                  Prior Functioning/Environment              Frequency  Min 2X/week        Progress Toward Goals  OT Goals(current goals can now be found in the care plan section)  Progress towards OT goals: Progressing toward goals  Acute Rehab OT Goals Patient Stated Goal: unable OT Goal Formulation: Patient unable to participate in goal setting Time For Goal Achievement: 02/23/20 Potential to Achieve Goals: Good ADL Goals Pt Will Perform Eating: with supervision;sitting Pt Will Perform Grooming: with supervision;sitting Pt Will Perform Upper Body Dressing: with supervision;sitting Additional ADL Goal #1: Pt will follow 1 step stimple commands with 50% accuracy during functional task. Additional ADL Goal #2: Pt will sustain attention to ADL task >5 min with no more than min cues. Additional ADL Goal #3: Pt will perform bed mobility with minA as precursor to EOB/OOB ADL.  Plan Discharge plan remains appropriate    Co-evaluation      Reason for Co-Treatment: Necessary to address cognition/behavior during functional activity;For  patient/therapist safety   OT goals addressed during session: ADL's and self-care      AM-PAC OT "6 Clicks" Daily Activity     Outcome Measure   Help from another person eating meals?: A Little Help from another person taking care of personal grooming?: A Little Help from another person toileting, which includes using toliet, bedpan, or urinal?: Total Help from another person bathing (including washing, rinsing, drying)?: A Lot Help from another person to put on and taking off regular upper body clothing?: A Little Help from another person to put on and taking off regular lower body clothing?: Total 6 Click Score: 13    End of Session Equipment Utilized During Treatment: Gait belt;Rolling walker(Do not recommend using RW)  OT Visit Diagnosis: Muscle weakness (generalized) (M62.81);Other symptoms and signs involving cognitive function;Other symptoms and signs involving the nervous system (R29.898)   Activity Tolerance Patient tolerated treatment well   Patient Left in chair;with call bell/phone within reach;with  bed alarm set;Other (comment)(with mittens on, with CNA present)   Nurse Communication Mobility status;Other (comment)(and recommended not to use RW for pivot)        Time: 7494-4967 OT Time Calculation (min): 24 min  Charges: OT General Charges $OT Visit: 1 Visit OT Treatments $Self Care/Home Management : 8-22 mins  Layla Maw, OTR/L   Layla Maw 02/11/2020, 3:51 PM

## 2020-02-11 NOTE — Evaluation (Signed)
Speech Language Pathology Evaluation Patient Details Name: Monique Cabrera MRN: [id number] DOB: 1935/05/25 Today's Date: 02/11/2020 Time: 2458-0998 SLP Time Calculation (min) (ACUTE ONLY): 15 min  Problem List:  Patient Active Problem List   Diagnosis Date Noted  . Acute metabolic encephalopathy 33/82/5053  . COVID-19 virus infection 02/08/2020  . Acute kidney injury superimposed on CKD ([place]) 02/08/2020  . Status post below-knee amputation of right lower extremity ([place]) 11/07/2019  . Gangrene of right foot ([place])   . Hyponatremia 10/29/2019  . Chronic diastolic CHF (congestive heart failure) ([place]) 10/29/2019  . Stable proliferative diabetic retinopathy of both eyes associated with type 2 diabetes mellitus ([place]) 06/05/2018  . Acute CVA (cerebrovascular accident) ([place]) 02/18/2017  . Acute ischemic stroke ([place])   . Internuclear ophthalmoplegia of left eye   . Benign paroxysmal positional vertigo   . Abnormality of gait   . Acute onset of severe vertigo 02/17/2017  . Vertigo 02/17/2017  . Atherosclerosis of native artery of extremity with intermittent claudication ([place]) 03/11/2014  . Diabetic retinopathy ([place]) 03/11/2014  . Retinal hemorrhage due to secondary diabetes ([place]) 03/11/2014  . Type II diabetes mellitus with renal manifestations, uncontrolled ([place]) 03/11/2014  . Chronic hepatitis C without hepatic coma ([place]) 03/11/2014  . Hyperlipidemia 03/11/2014  . Hypoglycemia 04/16/2013  . Disorder of bone and cartilage, unspecified   . Other and unspecified hyperlipidemia   . Essential hypertension, benign   . Atherosclerosis of native artery of extremity ([place])   . Chronic kidney disease (CKD), stage II (mild)   . Peripheral arterial disease ([place])   . Anemia   . Postherpetic neuralgia   . Hypertension    Past Medical History:  Past Medical History:  Diagnosis Date  . Acute upper respiratory infections of unspecified site   . Amputee 08/2019  . Anemia   . Anemia, unspecified    . Atherosclerosis of native arteries of the extremities, unspecified   . Chest pain, unspecified   . Chronic kidney disease (CKD), stage II (mild)   . Diarrhea   . Disorder of bone and cartilage, unspecified   . Herpes zoster with other nervous system complications(053.19)   . Hypercalcemia   . Hypertension   . Hypertensive renal disease, benign   . Nonspecific reaction to tuberculin skin test without active tuberculosis(795.51)   . Other and unspecified hyperlipidemia   . Pain in joint, lower leg   . Peripheral arterial disease ([place])   . Postherpetic neuralgia   . Proteinuria   . Stroke ([place]) 01/2017  . Type II or unspecified type diabetes mellitus with renal manifestations, uncontrolled(250.42)   . Unspecified disorder of kidney and ureter    Past Surgical History:  Past Surgical History:  Procedure Laterality Date  . ABDOMINAL AORTOGRAM W/LOWER EXTREMITY N/A 10/31/2019   Procedure: ABDOMINAL AORTOGRAM W/LOWER EXTREMITY;  Surgeon: Wellington Hampshire, MD;  Location: [facility];  Service: Cardiovascular;  Laterality: N/A;  . AMPUTATION Right 11/02/2019   Procedure: AMPUTATION BELOW KNEE RIGHT;  Surgeon: Rosetta Posner, MD;  Location: [facility];  Service: Vascular;  Laterality: Right;  . hysterectomy    . INCISION AND DRAINAGE Left 05/27/14   sebacous cyst, ear  . PRP Left    Dr. Ricki Miller  . removal of cyst from hand    . removal of tumor from foot    . TONSILLECTOMY     HPI:  85 yo female adm to [facility] with AMS, differential is COVID encephalopathy, CVA or seizure.  Neuro has concerns  for seizures with Todd's paralysis. Pt PMH + for CVAs- one 02/18/17, DM2, Diabetic retinopathy, PAD, chronic hep B, herpetic neuralgia, gait abnormality, left gangrene s/p BKA in 10/2019.  MRI ordered for when able to conduct MRI w/ + COVID.  CT head negative for acute event, stable atrophy.   Assessment / Plan / Recommendation Clinical Impression  Pt presents with broad cognitive deficits with  impulsivity, impaired awareness and disorientation to time/surroundings, tangential speech.  Expressive/receptive language are functional, speech is fluent and clear, pt is socially appropriate, and she follows commands, but attention and recall are quite impaired.  Recommend SLP f/u for cognition during acute admission.     SLP Assessment  SLP Recommendation/Assessment: [name] SLP Visit Diagnosis: Cognitive communication deficit (R41.841)    Follow Up Recommendations  Inpatient Rehab    Frequency and Duration min 2x/week  2 weeks      SLP Evaluation Cognition  Overall Cognitive Status: No family/caregiver present to determine baseline cognitive functioning Arousal/Alertness: Awake/alert Orientation Level: Oriented to person;Disoriented to place;Disoriented to time;Disoriented to situation Attention: Focused Focused Attention: Impaired Focused Attention Impairment: Verbal basic Sustained Attention: Impaired Sustained Attention Impairment: Verbal basic Memory: Impaired Memory Impairment: Storage deficit;Retrieval deficit Awareness: Impaired Awareness Impairment: Intellectual impairment Problem Solving: Impaired Problem Solving Impairment: Verbal basic Behaviors: Impulsive Safety/Judgment: Impaired       Comprehension  Auditory Comprehension Overall Auditory Comprehension: Appears within functional limits for tasks assessed    Expression Expression Primary Mode of Expression: Verbal Verbal Expression Overall Verbal Expression: Appears within functional limits for tasks assessed   Oral / Motor  Oral Motor/Sensory Function Overall Oral Motor/Sensory Function: Within functional limits Motor Speech Overall Motor Speech: Appears within functional limits for tasks assessed   GO                    Monique Cabrera 02/11/2020, 3:22 PM  Monique Cabrera L. Tivis Ringer, [place] Office number  [phone] Pager [phone]

## 2020-02-11 NOTE — Progress Notes (Signed)
Patient got up the bed and sat on chair even with the bed alarm and 4 rails up. Patient has been confused and pulled her IV out and trying harder to take the mitten off. Redirected patient many times without success. Will continue to monitor patient.

## 2020-02-11 NOTE — Progress Notes (Signed)
STROKE TEAM PROGRESS NOTE   INTERVAL HISTORY Pt lying in bed, awake alert and pleasant.  Orientated to time age and self, but not to place.  Moving all extremities.  EEG still pending.  On Keppra.  OBJECTIVE Vitals:   02/10/20 2000 02/10/20 2359 02/11/20 0000 02/11/20 0930  BP: (!) 162/75  (!) 119/94 (!) 158/88  Pulse: 84   71  Resp: 16 20 15  (!) 23  Temp: 98 F (36.7 C)  98 F (36.7 C) 97.7 F (36.5 C)  TempSrc: Oral  Axillary Oral  SpO2:    100%  Weight:      Height:       CBC:  Recent Labs  Lab 02/10/20 0435 02/11/20 0657  WBC 11.3* 6.9  NEUTROABS 8.3* 4.3  HGB 10.9* 11.1*  HCT 31.9* 32.2*  MCV 91.1 90.4  PLT 232 144   Basic Metabolic Panel:  Recent Labs  Lab 02/10/20 1805 02/11/20 0657  NA 140 140  K 3.5 3.7  CL 109 112*  CO2 19* 20*  GLUCOSE 61* 103*  BUN 28* 19  CREATININE 1.57* 1.40*  CALCIUM 9.8 9.6  MG  --  1.2*  PHOS  --  2.1*   Lipid Panel:     Component Value Date/Time   CHOL 242 (H) 02/09/2020 1242   CHOL 204 (H) 11/24/2015 0938   TRIG 98 02/09/2020 1242   HDL 69 02/09/2020 1242   HDL 53 11/24/2015 0938   CHOLHDL 3.5 02/09/2020 1242   VLDL 20 02/09/2020 1242   LDLCALC 153 (H) 02/09/2020 1242   LDLCALC 60 02/07/2019 0900   HgbA1c:  Lab Results  Component Value Date   HGBA1C 11.0 (H) 02/09/2020    IMAGING past 48h MR BRAIN WO CONTRAST  Result Date: 02/10/2020 CLINICAL DATA:  Acute presentation with worsened level of consciousness and speech disturbance. EXAM: MRI HEAD WITHOUT CONTRAST TECHNIQUE: Multiplanar, multiecho pulse sequences of the brain and surrounding structures were obtained without intravenous contrast. COMPARISON:  CT studies 02/08/2020 FINDINGS: Brain: Diffusion imaging does not show any acute or subacute infarction. Chronic small-vessel ischemic changes affect the pons. No focal cerebellar insult. Cerebral hemispheres show generalized atrophy with mild chronic small-vessel change of the white matter considering age. No  cortical or large vessel territory infarction. No mass lesion, hemorrhage, hydrocephalus or extra-axial collection. Vascular: Major vessels at the base of the brain show flow. Skull and upper cervical spine: Negative Sinuses/Orbits: Clear/normal Other: None IMPRESSION: No acute finding. Age related atrophy. Chronic small-vessel ischemic changes of the pons and cerebral hemispheric white matter, fairly mild considering age. Electronically Signed   By: Nelson Chimes M.D.   On: 02/10/2020 03:55   VAS [place] LOWER EXTREMITY VENOUS (DVT)  Result Date: 02/10/2020  Lower Venous DVTStudy Indications: Elevated D-Dimer, Covid+.  Risk Factors: Surgery Right below knee amputation. Limitations: Dementia, uncooperative. Comparison Study: No prior exams. Performing Technologist: Baldwin Crown RDMS, RVT  Examination Guidelines: A complete evaluation includes B-mode imaging, spectral Doppler, color Doppler, and power Doppler as needed of all accessible portions of each vessel. Bilateral testing is considered an integral part of a complete examination. Limited examinations for reoccurring indications may be performed as noted. The reflux portion of the exam is performed with the patient in reverse Trendelenburg.  +---------+---------------+---------+-----------+----------+--------------+ RIGHT    CompressibilityPhasicitySpontaneityPropertiesThrombus Aging +---------+---------------+---------+-----------+----------+--------------+ CFV      Full           Yes      Yes                                 +---------+---------------+---------+-----------+----------+--------------+  SFJ      Full                                                        +---------+---------------+---------+-----------+----------+--------------+ FV Prox  Full                                                        +---------+---------------+---------+-----------+----------+--------------+ FV Mid   Full                                                         +---------+---------------+---------+-----------+----------+--------------+ FV DistalFull                                                        +---------+---------------+---------+-----------+----------+--------------+ PFV      Full                                                        +---------+---------------+---------+-----------+----------+--------------+ POP      Full           Yes      Yes                                 +---------+---------------+---------+-----------+----------+--------------+   +---------+---------------+---------+-----------+----------+--------------+ LEFT     CompressibilityPhasicitySpontaneityPropertiesThrombus Aging +---------+---------------+---------+-----------+----------+--------------+ CFV      Full           Yes      Yes                                 +---------+---------------+---------+-----------+----------+--------------+ SFJ      Full                                                        +---------+---------------+---------+-----------+----------+--------------+ FV Prox  Full                                                        +---------+---------------+---------+-----------+----------+--------------+ FV Mid   Full                                                        +---------+---------------+---------+-----------+----------+--------------+  FV DistalFull                                                        +---------+---------------+---------+-----------+----------+--------------+ PFV      Full                                                        +---------+---------------+---------+-----------+----------+--------------+ POP      Full           Yes      Yes                                 +---------+---------------+---------+-----------+----------+--------------+ PTV      Full                                                         +---------+---------------+---------+-----------+----------+--------------+ PERO     Full                                                        +---------+---------------+---------+-----------+----------+--------------+     Summary: BILATERAL: - No evidence of deep vein thrombosis seen in the lower extremities, bilaterally.  RIGHT: - No cystic structure found in the popliteal fossa. - Right below knee amputation.  LEFT: - No cystic structure found in the popliteal fossa.  *See table(s) above for measurements and observations.    Preliminary    ECHOCARDIOGRAM LIMITED  Result Date: 02/09/2020    ECHOCARDIOGRAM LIMITED REPORT   Patient Name:   Monique Cabrera Date of Exam: 02/09/2020 Medical Rec #:  [id number]        Height:       65.0 in Accession #:    [id number]       Weight:       132.3 lb Date of Birth:  May 30, 1935        BSA:          1.66 m Patient Age:    85 years         BP:           209/93 mmHg Patient Gender: F                HR:           94 bpm. Exam Location:  Inpatient Procedure: Limited Echo and Color Doppler Indications:    CVA  History:        Patient has prior history of Echocardiogram examinations, most                 recent 02/18/2017. Stroke and PAD; Risk Factors:Hypertension,                 Diabetes and Dyslipidemia.  Sonographer:    Dustin Flock Referring Phys: Borger  L RINEHULS  Sonographer Comments: Image acquisition challenging due to uncooperative patient and cant follow commands. IMPRESSIONS  1. Left ventricular ejection fraction, by estimation, is 60 to 65%. The left ventricle has normal function. The left ventricle has no regional wall motion abnormalities. Left ventricular diastolic function could not be evaluated.  2. Right ventricular systolic function is normal. The right ventricular size is normal.  3. The mitral valve is normal in structure and function. No evidence of mitral valve regurgitation. No evidence of mitral stenosis.  4. The aortic valve is normal in  structure and function. Aortic valve regurgitation is not visualized. Mild aortic valve sclerosis is present, with no evidence of aortic valve stenosis.  5. The inferior vena cava is normal in size with greater than 50% respiratory variability, suggesting right atrial pressure of 3 mmHg. Conclusion(s)/Recomendation(s): Limited study due to poor patient cooperation. FINDINGS  Left Ventricle: Left ventricular ejection fraction, by estimation, is 60 to 65%. The left ventricle has normal function. The left ventricle has no regional wall motion abnormalities. The left ventricular internal cavity size was normal in size. There is  no left ventricular hypertrophy. The left ventricular diastology could not be evaluated due to indeterminate diastolic function. Right Ventricle: The right ventricular size is normal. No increase in right ventricular wall thickness. Right ventricular systolic function is normal. Left Atrium: Left atrial size was normal in size. Right Atrium: Right atrial size was normal in size. Pericardium: There is no evidence of pericardial effusion. Mitral Valve: The mitral valve is normal in structure and function. Normal mobility of the mitral valve leaflets. No evidence of mitral valve stenosis. Tricuspid Valve: The tricuspid valve is normal in structure. Tricuspid valve regurgitation is not demonstrated. No evidence of tricuspid stenosis. Aortic Valve: The aortic valve is normal in structure and function. Aortic valve regurgitation is not visualized. Mild aortic valve sclerosis is present, with no evidence of aortic valve stenosis. Pulmonic Valve: The pulmonic valve was normal in structure. Pulmonic valve regurgitation is not visualized. No evidence of pulmonic stenosis. Aorta: The aortic root is normal in size and structure. Venous: The inferior vena cava is normal in size with greater than 50% respiratory variability, suggesting right atrial pressure of 3 mmHg. IAS/Shunts: No atrial level shunt  detected by color flow Doppler.  LEFT VENTRICLE PLAX 2D LVIDd:         3.27 cm Diastology LVIDs:         2.15 cm LV e' lateral: 6.31 cm/s LV PW:         1.07 cm LV e' medial:  3.48 cm/s LV IVS:        1.24 cm  Mihai Croitoru MD Electronically signed by Sanda Klein MD Signature Date/Time: 02/09/2020/12:54:09 PM    Final      PHYSICAL EXAM   Temp:  [97.7 F (36.5 C)-98.2 F (36.8 C)] 97.7 F (36.5 C) (02/15 0930) Pulse Rate:  [71-101] 71 (02/15 0930) Resp:  [12-25] 23 (02/15 0930) BP: (119-179)/(75-131) 158/88 (02/15 0930) SpO2:  [79 %-100 %] 100 % (02/15 0930)  General - Well nourished, well developed, in no apparent distress.  Ophthalmologic - fundi not visualized due to noncooperation.  Cardiovascular - Regular rhythm and rate.  Mental Status -  Level of arousal and orientation to time and person were intact, however not orientated to place. Language including expression, repetition, comprehension was assessed and found intact.  Naming 3/4.  Cranial Nerves II - XII - II - Visual field intact OU. III, IV,  VI - Extraocular movements intact. V - Facial sensation intact bilaterally. VII - Facial movement intact bilaterally. VIII - Hearing & vestibular intact bilaterally. X - Palate elevates symmetrically. XI - Chin turning & shoulder shrug intact bilaterally. XII - Tongue protrusion intact.  Motor Strength - The patient's strength was normal in all extremities except right BKA and pronator drift was absent.  Bulk was normal and fasciculations were absent.   Motor Tone - Muscle tone was assessed at the neck and appendages and was normal.  Reflexes - The patient's reflexes were symmetrical in all extremities and she had no pathological reflexes.  Sensory - Light touch, temperature/pinprick were assessed and were symmetrical.    Coordination - The patient had normal movements in the hands with no ataxia or dysmetria.  Tremor was absent.  Gait and Station -  deferred.    ASSESSMENT/PLAN Monique Cabrera is a 85 y.o. female with history of stroke (2018), DM 2, PAD, Rt BKA, CKD 2, HTN and recent cofusion who presented to [facility] ED as a code stroke for right side weakness, and unresponsiveness.   She did not receive IV t-PA due to late presentation (>4.5 hours from time of onset)  Acute AMS - concerning for seizure with Todd's paralysis   Presented with unresponsive, found down at home with b/b incontinence as well as right sided weakness, now resolved.  CT Head - No acute intracranial abnormality or significant interval change.   MRI head - No acute finding.  CTA H&N - no high grade stenosis or occlusion.  Left M2 stenosis unchanged.  2D Echo - EF 60 - 65%. No cardiac source of emboli identified.   LE dopplers neg DVT   EEG 2/12 - moderate to severe diffuse encephalopathy, nonspecific etiology. No seizures or epileptiform    LDL - 60  HgbA1c - 11.0  VTE prophylaxis - Lovenox 30   Aspirin 81 mg daily and Plavix 75 mg daily prior to admission, now on aspirin 81 mg daily and clopidogrel 75 mg daily. Continue on discharge  Started on  keppra 250 bid, tolerating well, continue on discharge  Therapy recommendations:  CIR vs SNF (CIR will not take within 20d of COVID positivity)   Disposition:  Pending  COVID-19 Infection  Lacey Jensen Virus 2 - positive on admission   On IV remdesivir  D-Dimer 4.5  LE dopplers neg DVT   Treatment per primary team  Hx of stroke  01/2017 presented with vision change, vertigo and imbalance.  MRI showed punctate periventricular infarct.  CTA head and neck showed left M2 high-grade stenosis.  EF 60 to 65%.  LDL 102 and A1c 8.4.  Discharged with aspirin 325 and Plavix for 3 months as well as Pravachol 40  Hypertension  Home BP meds: Coreg, Norvasc, Diovan HCT  Current BP meds: coreg 25 bid, norvasc 10  . BP elevated but improving  . BP goal normotensive  Hyperlipidemia  Home Lipid lowering  medication: Lipitor 20 mg daily  LDL 60, goal less than 70  Current lipid lowering medication: Lipitor 20 mg daily  Continue statin at discharge  Diabetes w/ mild DKA on admission  Home diabetic meds: lantus  SSI  HgbA1c - 11.0, goal < 7.0  CBG monitoring  Close PCP follow up for better DM control.  Other Stroke Risk Factors  Advanced age  Former cigarette smoker - quit  Hx stroke/TIA  PAD w/ R BKA  Other Active Problems  Code status - DNR  AKI  on CKD stage 3a - creatinine - 1.70->1.40->1.90->1.57->1.40.   Leukocytosis - 17.2->14.3->11.3->6.9  (afebrile)  Hyponatremia - 131->144->140->140   Hypokalemia - 3.0->3.5->3.7   Hospital day # 3  Neurology will sign off. Please call with questions. Pt will follow up with stroke clinic NP at [name] in about 4 weeks. Thanks for the consult.  Rosalin Hawking, MD PhD Stroke Neurology 02/11/2020 12:37 PM       To contact Stroke Continuity provider, please refer to [email or web address] After hours, contact General Neurology

## 2020-02-11 NOTE — Progress Notes (Signed)
PROGRESS NOTE                                                                                                                                                                                                             Patient Demographics:    Monique Cabrera, is a 85 y.o. female, DOB - 11/28/1935, QJJ:[id number]  Admit date - 02/08/2020   Admitting Physician Karmen Bongo, MD  Outpatient Primary MD for the patient is [name], DO  LOS - 3   Chief Complaint  Patient presents with  . Code Stroke       Brief Narrative    85 y.o. female with medical history significant of HTN; DM; CVA (2018); PAD s/p R BKA (10/2019); HLD; HTN; and stage 2 CKD presenting with unresponsiveness and right-sided weakness  , concerning for CVA.  She is obtunded and essentially unresponsive and so unable to answer questions.  Patient was seen by neurology, with clinical suspicion for CVA, admitted for CVA work-up, MRI done on 2/14 a.m., with no evidence of acute CVA, as he has been following regarding encephalopathic,   Subjective:    Monique Cabrera today she is more interactive, coherent, she herself denies any complaints today.   Assessment  & Plan :    Principal Problem:   Acute metabolic encephalopathy Active Problems:   Peripheral arterial disease (HCC)   Hypertension   Type II diabetes mellitus with renal manifestations, uncontrolled (HCC)   Hyperlipidemia   COVID-19 virus infection   Acute kidney injury superimposed on CKD ([place])   acute encephalopathy concerning for seizures with Todd's paralysis -Presented with unresponsiveness, found down at home, withb/b incontinence as well as right sided weakness, now resolved. -CT head with no acute intracranial abnormalities. CTA head and neck with no high-grade stenosis or occlusion.  No indication for carotid Dopplers. -MRI head with no evidence of acute CVA. -So far no clear etiology of her  encephalopathy,discussed with neurology, this point they feel video EEG is negative help given patient is back to baseline. -Hypertensive encephalopathy, DKA encephalopathy, COVID-19 encephalopathy could be contributing factors as well. -Started on low-dose Keppra. -Continue with aspirin and Plavix -She does appear to be more awake today, SLP consulted  COVID-19 infection  -So far no hypoxia, no indication for steroids -Continue with IV remdesivir. -Dimers elevated at 4.5 on presentation,  will check venous Doppler.  HTN -Blood pressure remains elevated, and Coreg 12.5, will increase Coreg to home dose of 25 mg p.o. twice daily -Continue to hold Diovan/hydrochlorothiazide given AKI.  HLD -LDL is 153, resume statin when able to take oral  DM with mild DKA on admission -Patient is known to be diabetic, anion gap elevated 18 on presentation, with evidence of ketonuria and hyperglycemia.  Anion gap appears to be currently normalized. -Have stopped  her Lantus given hypoglycemia, continue with a sliding scale. -Appears to be poorly controlled with hemoglobin A1c of 11  PAD -s/p R BKA with good stump healing  AKI on Stage 3a CKD -Likely associated with COVID-19 infection -Since the patient is too obtunded to take PO right now, will start judicious IVF  Hypokalemia -Repleted     COVID-19 Labs  Recent Labs    02/08/20 1530 02/08/20 1530 02/09/20 1242 02/10/20 0435 02/11/20 0657  DDIMER 4.48*   < > 3.40* 2.24* 2.65*  FERRITIN 1,248*   < > 1,091* 887* 819*  LDH 205*  --   --   --   --   CRP 1.5*   < > 3.8* 2.5* 1.3*   < > = values in this interval not displayed.    Lab Results  Component Value Date   [place] (A) 02/08/2020   [place] NEGATIVE 10/29/2019     Code Status : DNR  Family Communication  : Tried to reach son Mollie [place] [phone], left a voicemail  Disposition Plan  : Pending PT/OT/SLP evaluation  Barriers For Discharge : Patient  still having active CVA work-up, remains altered, unable to take an oral intake.  Consults  :  Neurology  Procedures  : None  DVT Prophylaxis  :  [place] lovenox  Lab Results  Component Value Date   PLT 228 02/11/2020    Antibiotics  :    Anti-infectives (From admission, onward)   Start     Dose/Rate Route Frequency Ordered Stop   02/09/20 1000  remdesivir 100 mg in sodium chloride 0.9 % 100 mL IVPB     100 mg 200 mL/hr over 30 Minutes Intravenous Daily 02/08/20 1924 02/13/20 0959   02/08/20 2000  remdesivir 200 mg in sodium chloride 0.9% 250 mL IVPB     200 mg 580 mL/hr over 30 Minutes Intravenous Once 02/08/20 1924 02/08/20 2312        Objective:   Vitals:   02/10/20 2000 02/10/20 2359 02/11/20 0000 02/11/20 0930  BP: (!) 162/75  (!) 119/94 (!) 158/88  Pulse: 84   71  Resp: 16 20 15  (!) 23  Temp: 98 F (36.7 C)  98 F (36.7 C) 97.7 F (36.5 C)  TempSrc: Oral  Axillary Oral  SpO2:    100%  Weight:      Height:        Wt Readings from Last 3 Encounters:  02/09/20 49.8 kg  01/24/20 60.3 kg  12/04/19 60.3 kg     Intake/Output Summary (Last 24 hours) at 02/11/2020 1344 Last data filed at 02/11/2020 0950 Gross per 24 hour  Intake 243 ml  Output 630 ml  Net -387 ml     Physical Exam  Awake Alert, more conversant, appropriate and coherent today  symmetrical Chest wall movement, Good air movement bilaterally, CTAB RRR,No Gallops,Rubs or new Murmurs, No Parasternal Heave +ve B.Sounds, Abd Soft, No tenderness, No rebound - guarding or rigidity. No Cyanosis, Clubbing or edema, No new Rash or bruise, right BKA  Data Review:    CBC Recent Labs  Lab 02/08/20 0950 02/08/20 0953 02/09/20 1242 02/10/20 0435 02/11/20 0657  WBC 17.2*  --  14.3* 11.3* 6.9  HGB 12.5 12.9 12.2 10.9* 11.1*  HCT 36.5 38.0 35.0* 31.9* 32.2*  PLT 309  --  282 232 228  MCV 90.3  --  88.4 91.1 90.4  MCH 30.9  --  30.8 31.1 31.2  MCHC 34.2  --  34.9 34.2 34.5  RDW 12.4  --  12.5  12.8 12.6  LYMPHSABS 1.2  --  1.5 1.9 1.5  MONOABS 0.8  --  1.1* 0.9 0.5  EOSABS 0.0  --  0.0 0.0 0.5  BASOSABS 0.0  --  0.0 0.0 0.0    Chemistries  Recent Labs  Lab 02/08/20 0950 02/08/20 0950 02/08/20 0953 02/09/20 1242 02/10/20 0435 02/10/20 1805 02/11/20 0657  NA 132*   < > 131* 140 144 140 140  K 4.0   < > 4.0 3.3* 3.0* 3.5 3.7  CL 93*   < > 95* 101 111 109 112*  CO2 21*  --   --  24 23 19* 20*  GLUCOSE 443*   < > 446* 223* 99 61* 103*  BUN 21   < > 23 31* 30* 28* 19  CREATININE 1.70*   < > 1.40* 1.80* 1.90* 1.57* 1.40*  CALCIUM 10.8*  --   --  10.8* 10.5* 9.8 9.6  MG  --   --   --   --   --   --  1.2*  AST 22  --   --  18 17  --  21  ALT 17  --   --  13 11  --  14  ALKPHOS 69  --   --  62 60  --  54  BILITOT 1.2  --   --  0.5 0.3  --  1.0   < > = values in this interval not displayed.   ------------------------------------------------------------------------------------------------------------------ Recent Labs    02/09/20 1242  CHOL 242*  HDL 69  LDLCALC 153*  TRIG 98  CHOLHDL 3.5    Lab Results  Component Value Date   HGBA1C 11.0 (H) 02/09/2020   ------------------------------------------------------------------------------------------------------------------ No results for input(s): TSH, T4TOTAL, T3FREE, THYROIDAB in the last 72 hours.  Invalid input(s): FREET3 ------------------------------------------------------------------------------------------------------------------ Recent Labs    02/10/20 0435 02/11/20 0657  FERRITIN 887* 819*    Coagulation profile Recent Labs  Lab 02/08/20 0950  INR 1.0    Recent Labs    02/10/20 0435 02/11/20 0657  DDIMER 2.24* 2.65*    Cardiac Enzymes No results for input(s): CKMB, TROPONINI, MYOGLOBIN in the last 168 hours.  Invalid input(s): CK ------------------------------------------------------------------------------------------------------------------ No results found for: BNP  Inpatient  Medications  Scheduled Meds: . amLODipine  10 mg Oral Daily  . aspirin EC  81 mg Oral Daily  . atorvastatin  20 mg Oral q1800  . carvedilol  25 mg Oral BID WC  . clopidogrel  75 mg Oral Daily  . enoxaparin (LOVENOX) injection  30 mg Subcutaneous Q24H  . insulin aspart  0-9 Units Subcutaneous Q4H  . levETIRAcetam  250 mg Oral BID  . sodium chloride flush  3 mL Intravenous Q12H   Continuous Infusions: . dextrose 5 % and 0.9 % NaCl with KCl 40 mEq/L 125 mL/hr at 02/11/20 0600  . remdesivir 100 mg in NS 100 mL 100 mg (02/11/20 0948)   PRN Meds:.acetaminophen **OR** acetaminophen (TYLENOL) oral liquid 160 mg/5 mL **  OR** acetaminophen, dextrose, hydrALAZINE, ondansetron (ZOFRAN) IV, senna-docusate  Micro Results Recent Results (from the past 240 hour(s))  Respiratory Panel by RT PCR (Flu A&B, Covid) - Nasopharyngeal Swab     Status: Abnormal   Collection Time: 02/08/20  9:45 AM   Specimen: Nasopharyngeal Swab  Result Value Ref Range Status   SARS Coronavirus 2 by RT PCR POSITIVE (A) NEGATIVE Final    Comment: RESULT CALLED TO, READ BACK BY AND VERIFIED WITH: A. Florene Glen RN 11:50 02/08/20 ([email or web address]) (NOTE) SARS-CoV-2 target nucleic acids are DETECTED. SARS-CoV-2 RNA is generally detectable in upper respiratory specimens  during the acute phase of infection. Positive results are indicative of the presence of the identified virus, but do not rule out bacterial infection or co-infection with other pathogens not detected by the test. Clinical correlation with patient history and other diagnostic information is necessary to determine patient infection status. The expected result is Negative. Fact Sheet for Patients:  PinkCheek.be Fact Sheet for Healthcare Providers: GravelBags.it This test is not yet approved or cleared by the [place] FDA and  has been authorized for detection and/or diagnosis of SARS-CoV-2 by FDA under an  Emergency Use Authorization (EUA).  This EUA will remain in effect (meaning this test can be used)  for the duration of  the COVID-19 declaration under Section 564(b)(1) of the Act, 21 U.S.C. section 360bbb-3(b)(1), unless the authorization is terminated or revoked sooner.    Influenza A by PCR NEGATIVE NEGATIVE Final   Influenza B by PCR NEGATIVE NEGATIVE Final    Comment: (NOTE) The Xpert Xpress SARS-CoV-2/FLU/RSV assay is intended as an aid in  the diagnosis of influenza from Nasopharyngeal swab specimens and  should not be used as a sole basis for treatment. Nasal washings and  aspirates are unacceptable for Xpert Xpress SARS-CoV-2/FLU/RSV  testing. Fact Sheet for Patients: PinkCheek.be Fact Sheet for Healthcare Providers: GravelBags.it This test is not yet approved or cleared by the [place] FDA and  has been authorized for detection and/or diagnosis of SARS-CoV-2 by  FDA under an Emergency Use Authorization (EUA). This EUA will remain  in effect (meaning this test can be used) for the duration of the  Covid-19 declaration under Section 564(b)(1) of the Act, 21  U.S.C. section 360bbb-3(b)(1), unless the authorization is  terminated or revoked. Performed at [facility], [place] [address], [place], [place] [address]     Radiology Reports CT ANGIO HEAD W OR WO CONTRAST  Addendum Date: 02/08/2020   ADDENDUM REPORT: 02/08/2020 14:54 ADDENDUM: A 2 mm left MCA bifurcation aneurysm is stable from the prior exam. This is noted on further inquiry for explanation of right-sided symptoms. This is unlikely to be related to the patient's symptoms. Electronically Signed   By: San Morelle M.D.   On: 02/08/2020 14:54   Result Date: 02/08/2020 CLINICAL DATA:  Right-sided weakness. EXAM: CT ANGIOGRAPHY HEAD AND NECK TECHNIQUE: Multidetector CT imaging of the head and neck was performed using the standard protocol during  bolus administration of intravenous contrast. Multiplanar CT image reconstructions and MIPs were obtained to evaluate the vascular anatomy. Carotid stenosis measurements (when applicable) are obtained utilizing NASCET criteria, using the distal internal carotid diameter as the denominator. CONTRAST:  79mL OMNIPAQUE IOHEXOL 350 MG/ML SOLN COMPARISON:  None. FINDINGS: CTA NECK FINDINGS Aortic arch: Atherosclerotic calcifications are present within the aortic arch and at the origins the great vessels without significant stenosis or change. There is no aneurysm. Right carotid system: Right common carotid artery is  within normal limits. Bifurcation is somewhat obscured by patient motion. No significant stenosis is present. Mild tortuosity is present in the cervical right ICA without significant stenosis. Left carotid system: Left common carotid artery is tortuous. Atherosclerotic changes are present at the bifurcation without significant stenosis. There is mild tortuosity of the cervical left ICA without significant stenosis. Vertebral arteries: The left vertebral artery is the dominant vessel. Both vertebral arteries originate from the subclavian arteries without significant stenosis. There is no definite stenosis in the neck. Skeleton: Degenerative changes are present lower cervical spine. Osseous structures are somewhat distorted by patient motion. No acute abnormalities are present. Other neck: The soft tissues the neck are otherwise unremarkable. Upper chest: Centrilobular emphysematous changes are present. No nodule or mass lesion is present. Thoracic inlet is normal. Review of the MIP images confirms the above findings CTA HEAD FINDINGS Anterior circulation: Atherosclerotic changes are present within the cavernous internal carotid arteries bilaterally without significant stenosis. Mild narrowing is present in the proximal right A1 segment. No emergent large vessel occlusion is present. There is some attenuation of  distal small vessels. Posterior circulation: The left vertebral artery is the dominant vessel. No significant stenosis is present. There is mild narrowing the mid basilar artery. Both posterior cerebral arteries originate from basilar tip. Distal vessel attenuation is present without a significant proximal stenosis or occlusion. Venous sinuses: The dural sinuses patent. The straight sinus and deep cerebral veins are intact. Cortical veins are distorted by patient motion. Anatomic variants: None Review of the MIP images confirms the above findings IMPRESSION: 1. No emergent large vessel occlusion. 2. Atherosclerotic changes at the aortic arch, left carotid bifurcation, and cavernous internal carotid arteries bilaterally without significant stenosis. 3. Mild narrowing of the proximal right A1 segment. 4. Distal small vessel attenuation without a significant proximal stenosis, aneurysm, or branch vessel occlusion within the Circle of Willis. These results were called by telephone at the time of interpretation on 02/08/2020 at 10:05am to provider Dr. Lorraine Lax, Who verbally acknowledged these results. Electronically Signed: By: San Morelle M.D. On: 02/08/2020 10:34   CT Angio Neck W and/or Wo Contrast  Addendum Date: 02/08/2020   ADDENDUM REPORT: 02/08/2020 14:54 ADDENDUM: A 2 mm left MCA bifurcation aneurysm is stable from the prior exam. This is noted on further inquiry for explanation of right-sided symptoms. This is unlikely to be related to the patient's symptoms. Electronically Signed   By: San Morelle M.D.   On: 02/08/2020 14:54   Result Date: 02/08/2020 CLINICAL DATA:  Right-sided weakness. EXAM: CT ANGIOGRAPHY HEAD AND NECK TECHNIQUE: Multidetector CT imaging of the head and neck was performed using the standard protocol during bolus administration of intravenous contrast. Multiplanar CT image reconstructions and MIPs were obtained to evaluate the vascular anatomy. Carotid stenosis  measurements (when applicable) are obtained utilizing NASCET criteria, using the distal internal carotid diameter as the denominator. CONTRAST:  38mL OMNIPAQUE IOHEXOL 350 MG/ML SOLN COMPARISON:  None. FINDINGS: CTA NECK FINDINGS Aortic arch: Atherosclerotic calcifications are present within the aortic arch and at the origins the great vessels without significant stenosis or change. There is no aneurysm. Right carotid system: Right common carotid artery is within normal limits. Bifurcation is somewhat obscured by patient motion. No significant stenosis is present. Mild tortuosity is present in the cervical right ICA without significant stenosis. Left carotid system: Left common carotid artery is tortuous. Atherosclerotic changes are present at the bifurcation without significant stenosis. There is mild tortuosity of the cervical left ICA without significant  stenosis. Vertebral arteries: The left vertebral artery is the dominant vessel. Both vertebral arteries originate from the subclavian arteries without significant stenosis. There is no definite stenosis in the neck. Skeleton: Degenerative changes are present lower cervical spine. Osseous structures are somewhat distorted by patient motion. No acute abnormalities are present. Other neck: The soft tissues the neck are otherwise unremarkable. Upper chest: Centrilobular emphysematous changes are present. No nodule or mass lesion is present. Thoracic inlet is normal. Review of the MIP images confirms the above findings CTA HEAD FINDINGS Anterior circulation: Atherosclerotic changes are present within the cavernous internal carotid arteries bilaterally without significant stenosis. Mild narrowing is present in the proximal right A1 segment. No emergent large vessel occlusion is present. There is some attenuation of distal small vessels. Posterior circulation: The left vertebral artery is the dominant vessel. No significant stenosis is present. There is mild narrowing  the mid basilar artery. Both posterior cerebral arteries originate from basilar tip. Distal vessel attenuation is present without a significant proximal stenosis or occlusion. Venous sinuses: The dural sinuses patent. The straight sinus and deep cerebral veins are intact. Cortical veins are distorted by patient motion. Anatomic variants: None Review of the MIP images confirms the above findings IMPRESSION: 1. No emergent large vessel occlusion. 2. Atherosclerotic changes at the aortic arch, left carotid bifurcation, and cavernous internal carotid arteries bilaterally without significant stenosis. 3. Mild narrowing of the proximal right A1 segment. 4. Distal small vessel attenuation without a significant proximal stenosis, aneurysm, or branch vessel occlusion within the Circle of Willis. These results were called by telephone at the time of interpretation on 02/08/2020 at 10:05am to provider Dr. Lorraine Lax, Who verbally acknowledged these results. Electronically Signed: By: San Morelle M.D. On: 02/08/2020 10:34   MR BRAIN WO CONTRAST  Result Date: 02/10/2020 CLINICAL DATA:  Acute presentation with worsened level of consciousness and speech disturbance. EXAM: MRI HEAD WITHOUT CONTRAST TECHNIQUE: Multiplanar, multiecho pulse sequences of the brain and surrounding structures were obtained without intravenous contrast. COMPARISON:  CT studies 02/08/2020 FINDINGS: Brain: Diffusion imaging does not show any acute or subacute infarction. Chronic small-vessel ischemic changes affect the pons. No focal cerebellar insult. Cerebral hemispheres show generalized atrophy with mild chronic small-vessel change of the white matter considering age. No cortical or large vessel territory infarction. No mass lesion, hemorrhage, hydrocephalus or extra-axial collection. Vascular: Major vessels at the base of the brain show flow. Skull and upper cervical spine: Negative Sinuses/Orbits: Clear/normal Other: None IMPRESSION: No acute  finding. Age related atrophy. Chronic small-vessel ischemic changes of the pons and cerebral hemispheric white matter, fairly mild considering age. Electronically Signed   By: Nelson Chimes M.D.   On: 02/10/2020 03:55   DG CHEST PORT 1 VIEW  Result Date: 02/08/2020 CLINICAL DATA:  COVID-19 positive, altered mental status EXAM: PORTABLE CHEST 1 VIEW COMPARISON:  Radiograph 10/04/2019 FINDINGS: Coarse interstitial changes are similar to priors. No consolidation, features of edema, pneumothorax, or effusion. Pulmonary vascularity is normally distributed. The aorta is calcified. The remaining cardiomediastinal contours are unremarkable. No acute osseous or soft tissue abnormality. No acute osseous or soft tissue abnormality. IMPRESSION: No acute cardiopulmonary findings. Coarse interstitial changes are similar to priors. Aortic Atherosclerosis (ICD10-I70.0). Electronically Signed   By: Lovena Le M.D.   On: 02/08/2020 19:47   DG Abd Portable 1V  Result Date: 02/09/2020 CLINICAL DATA:  Metal screening prior to MRI. EXAM: PORTABLE ABDOMEN - 1 VIEW COMPARISON:  CT abdomen and pelvis 08/21/2012 FINDINGS: No radiopaque foreign body is  identified in the abdomen or pelvis with incomplete imaging of the inferior pelvic ring. No dilated loops of bowel are seen to suggest obstruction. Atherosclerotic vascular calcifications are noted. The visualized lung bases are grossly clear. IMPRESSION: No metallic foreign body identified. Electronically Signed   By: Logan Bores M.D.   On: 02/09/2020 10:04   EEG adult  Result Date: 02/08/2020 Lora Havens, MD     02/08/2020  1:32 PM Patient Name: Monique Cabrera MRN: [id number] Epilepsy Attending: Lora Havens Referring Physician/Provider: Laurey Morale, NP Date: 02/08/2020 Duration: 36.20 minutes Patient history: 85 year old female with prior medical history of stroke who presented with unresponsiveness and right-sided weakness.  CT head negative for hemorrhage.  EEG  evaluate for seizures. Level of alertness: Lethargic AEDs during EEG study: None Technical aspects: This EEG study was done with scalp electrodes positioned according to the 10-20 International system of electrode placement. Electrical activity was acquired at a sampling rate of 500Hz  and reviewed with a high frequency filter of 70Hz  and a low frequency filter of 1Hz . EEG data were recorded continuously and digitally stored. Description: EEG showed continuous generalized low amplitude 2 to 3 Hz delta slowing as well as 13 to 15 Hz frontocentral beta activity.  Hyperventilation and photic stimulation were not performed. Of note in the beginning, study was technically difficult due to significant myogenic artifact. Abnormality -Continuous slow, generalized IMPRESSION: This study is suggestive of moderate to severe diffuse encephalopathy, nonspecific etiology. No seizures or epileptiform discharges were seen throughout the recording. Lora Havens   CT HEAD CODE STROKE WO CONTRAST  Result Date: 02/08/2020 CLINICAL DATA:  Code stroke. Neuro deficit, acute, stroke suspected. EXAM: CT HEAD WITHOUT CONTRAST TECHNIQUE: Contiguous axial images were obtained from the base of the skull through the vertex without intravenous contrast. COMPARISON:  None. FINDINGS: Brain: No acute infarct, hemorrhage, or mass lesion is present. Moderate generalized atrophy and white matter disease is stable. The ventricles are proportionate to the degree of atrophy. Basal ganglia and insular cortex is bilaterally. No acute or focal cortical abnormality is present. No significant extraaxial fluid collection is present. The brainstem and cerebellum are within normal limits. Vascular: Extensive vascular calcifications extend to the MCA bifurcations bilaterally without significant interval change. No other asymmetric hyperdense vessel is present. Dense calcifications in the basilar artery vertebral arteries are also stable. Skull: Insert  normal skull No significant extracranial soft tissue lesion is present. Sinuses/Orbits: The paranasal sinuses and mastoid air cells are clear. Bilateral lens replacements are noted. Globes and orbits are otherwise unremarkable. Other: ASPECTS ([place] Stroke Program Early CT Score) - Ganglionic level infarction (caudate, lentiform nuclei, internal capsule, insula, M1-M3 cortex): 3/3 - Supraganglionic infarction (M4-M6 cortex): 7/7 Total score (0-10 with 10 being normal): 10/10 IMPRESSION: 1. No acute intracranial abnormality or significant interval change. 2. Stable advanced atrophy and diffuse white matter disease. 3. Advanced calcific atherosclerotic changes extend to the MCA bifurcations and the basilar artery are stable. 4. ASPECTS is 10/10 1. The above was relayed via text pager to Dr. Lorraine Lax on 02/08/2020 at 10:03 . Electronically Signed   By: San Morelle M.D.   On: 02/08/2020 10:04   VAS [place] LOWER EXTREMITY VENOUS (DVT)  Result Date: 02/10/2020  Lower Venous DVTStudy Indications: Elevated D-Dimer, Covid+.  Risk Factors: Surgery Right below knee amputation. Limitations: Dementia, uncooperative. Comparison Study: No prior exams. Performing Technologist: Baldwin Crown RDMS, RVT  Examination Guidelines: A complete evaluation includes B-mode imaging, spectral Doppler, color Doppler, and power Doppler  as needed of all accessible portions of each vessel. Bilateral testing is considered an integral part of a complete examination. Limited examinations for reoccurring indications may be performed as noted. The reflux portion of the exam is performed with the patient in reverse Trendelenburg.  +---------+---------------+---------+-----------+----------+--------------+ RIGHT    CompressibilityPhasicitySpontaneityPropertiesThrombus Aging +---------+---------------+---------+-----------+----------+--------------+ CFV      Full           Yes      Yes                                  +---------+---------------+---------+-----------+----------+--------------+ SFJ      Full                                                        +---------+---------------+---------+-----------+----------+--------------+ FV Prox  Full                                                        +---------+---------------+---------+-----------+----------+--------------+ FV Mid   Full                                                        +---------+---------------+---------+-----------+----------+--------------+ FV DistalFull                                                        +---------+---------------+---------+-----------+----------+--------------+ PFV      Full                                                        +---------+---------------+---------+-----------+----------+--------------+ POP      Full           Yes      Yes                                 +---------+---------------+---------+-----------+----------+--------------+   +---------+---------------+---------+-----------+----------+--------------+ LEFT     CompressibilityPhasicitySpontaneityPropertiesThrombus Aging +---------+---------------+---------+-----------+----------+--------------+ CFV      Full           Yes      Yes                                 +---------+---------------+---------+-----------+----------+--------------+ SFJ      Full                                                        +---------+---------------+---------+-----------+----------+--------------+  FV Prox  Full                                                        +---------+---------------+---------+-----------+----------+--------------+ FV Mid   Full                                                        +---------+---------------+---------+-----------+----------+--------------+ FV DistalFull                                                         +---------+---------------+---------+-----------+----------+--------------+ PFV      Full                                                        +---------+---------------+---------+-----------+----------+--------------+ POP      Full           Yes      Yes                                 +---------+---------------+---------+-----------+----------+--------------+ PTV      Full                                                        +---------+---------------+---------+-----------+----------+--------------+ PERO     Full                                                        +---------+---------------+---------+-----------+----------+--------------+     Summary: BILATERAL: - No evidence of deep vein thrombosis seen in the lower extremities, bilaterally.  RIGHT: - No cystic structure found in the popliteal fossa. - Right below knee amputation.  LEFT: - No cystic structure found in the popliteal fossa.  *See table(s) above for measurements and observations.    Preliminary    ECHOCARDIOGRAM LIMITED  Result Date: 02/09/2020    ECHOCARDIOGRAM LIMITED REPORT   Patient Name:   Monique Cabrera Date of Exam: 02/09/2020 Medical Rec #:  [id number]        Height:       65.0 in Accession #:    [id number]       Weight:       132.3 lb Date of Birth:  1935/05/15        BSA:          1.66 m Patient Age:    69 years         BP:  209/93 mmHg Patient Gender: F                HR:           94 bpm. Exam Location:  Inpatient Procedure: Limited Echo and Color Doppler Indications:    CVA  History:        Patient has prior history of Echocardiogram examinations, most                 recent 02/18/2017. Stroke and PAD; Risk Factors:Hypertension,                 Diabetes and Dyslipidemia.  Sonographer:    Dustin Flock Referring Phys: [place] Comments: Image acquisition challenging due to uncooperative patient and cant follow commands. IMPRESSIONS  1. Left ventricular ejection  fraction, by estimation, is 60 to 65%. The left ventricle has normal function. The left ventricle has no regional wall motion abnormalities. Left ventricular diastolic function could not be evaluated.  2. Right ventricular systolic function is normal. The right ventricular size is normal.  3. The mitral valve is normal in structure and function. No evidence of mitral valve regurgitation. No evidence of mitral stenosis.  4. The aortic valve is normal in structure and function. Aortic valve regurgitation is not visualized. Mild aortic valve sclerosis is present, with no evidence of aortic valve stenosis.  5. The inferior vena cava is normal in size with greater than 50% respiratory variability, suggesting right atrial pressure of 3 mmHg. Conclusion(s)/Recomendation(s): Limited study due to poor patient cooperation. FINDINGS  Left Ventricle: Left ventricular ejection fraction, by estimation, is 60 to 65%. The left ventricle has normal function. The left ventricle has no regional wall motion abnormalities. The left ventricular internal cavity size was normal in size. There is  no left ventricular hypertrophy. The left ventricular diastology could not be evaluated due to indeterminate diastolic function. Right Ventricle: The right ventricular size is normal. No increase in right ventricular wall thickness. Right ventricular systolic function is normal. Left Atrium: Left atrial size was normal in size. Right Atrium: Right atrial size was normal in size. Pericardium: There is no evidence of pericardial effusion. Mitral Valve: The mitral valve is normal in structure and function. Normal mobility of the mitral valve leaflets. No evidence of mitral valve stenosis. Tricuspid Valve: The tricuspid valve is normal in structure. Tricuspid valve regurgitation is not demonstrated. No evidence of tricuspid stenosis. Aortic Valve: The aortic valve is normal in structure and function. Aortic valve regurgitation is not visualized. Mild  aortic valve sclerosis is present, with no evidence of aortic valve stenosis. Pulmonic Valve: The pulmonic valve was normal in structure. Pulmonic valve regurgitation is not visualized. No evidence of pulmonic stenosis. Aorta: The aortic root is normal in size and structure. Venous: The inferior vena cava is normal in size with greater than 50% respiratory variability, suggesting right atrial pressure of 3 mmHg. IAS/Shunts: No atrial level shunt detected by color flow Doppler.  LEFT VENTRICLE PLAX 2D LVIDd:         3.27 cm Diastology LVIDs:         2.15 cm LV e' lateral: 6.31 cm/s LV PW:         1.07 cm LV e' medial:  3.48 cm/s LV IVS:        1.24 cm  Dani Gobble Croitoru MD Electronically signed by Sanda Klein MD Signature Date/Time: 02/09/2020/12:54:09 PM    Final       Phillips Climes M.D  on 02/11/2020 at 1:44 PM  Between 7am to 7pm - Pager - [phone]  After 7pm go to [email or web address] - password [name]  [facility] -  Office  [phone]

## 2020-02-11 NOTE — Consult Note (Signed)
   [facility] Inpatient Consult   02/11/2020  ANAE HAMS Nov 05, 1935 [id number]  Patient screened for high risk score for unplanned readmission score and for 2 hospitalizations and 4 ED visits in the past 6 months note in EMR. Patient with a HX of [place] Management involvement in the past.  to check if potential [place] Management services are needed.  Review of patient's medical record reveals patient is   Primary Care Provider is Hollace Kinnier, DO  Plan:  Reviewed and follow up with inpatient [facility] team for needs. Patient note to have some confusion.  Continue to follow progress and disposition to assess for post hospital care management needs.    Of note, [facility] Care Management services does not replace or interfere with any services that are arranged by inpatient [facility] care management team.   For additional questions or referrals please contact:   Natividad Brood, RN BSN [facility] Liaison  [phone] business mobile phone Toll free office [phone]  Fax number: [phone] [email or web address]

## 2020-02-11 NOTE — Progress Notes (Addendum)
Addendum: Pt requires 20 day waiting period prior to admission to CIR. Although pt would be a good candidate for CIR level rehab she will likely be medically ready for discharge far before the end of the isolation period. In light of this fact, PT currently recommending SNF level rehab at discharge.  Fumiko Cham B. Migdalia Dk PT, DPT Acute Rehabilitation Services Pager [phone] Office [phone]    Physical Therapy Treatment Patient Details Name: Monique Cabrera MRN: [id number] DOB: 01-19-1935 Today's Date: 02/11/2020    History of Present Illness 85 yo female adm to [facility] with AMS, differential is COVID encephalopathy, CVA or seizure.  Pt PMH significant for stroke, DM2, Diabetic retinopathy, PAD, chronic hep B, herpetic neuralgia, gait abnormality, left gangrene s/p BKA in 10/2019.  MRI ordered for when able to conduct MRI w/ + COVID.  CT head negative for acute event.    PT Comments    Pt much more interactive today and able to follow commands and with return of strength to R side. Pt is min guard for bed mobility and maxAx2-maxA for sit>stand and stand pivot transfers. Pt with increased L knee buckling in mobility but if able to lock out knee pivot transfer only requires one person. D/c plans remain appropriate however are unlikely given COVID+ diagnosis. PT will continue to follow acutely.   Follow Up Recommendations  SNF     Equipment Recommendations  Other (comment)(TBA)    Recommendations for Other Services Rehab consult     Precautions / Restrictions Precautions Precautions: Fall;Other (comment) Precaution Comments: R BKA Restrictions Weight Bearing Restrictions: No    Mobility  Bed Mobility Overal bed mobility: Needs Assistance Bed Mobility: Supine to Sit     Supine to sit: Min guard     General bed mobility comments: min guard for safety with pt moving toward EoB  Transfers Overall transfer level: Needs assistance Equipment used: Rolling walker (2  wheeled);None;1 person hand held assist Transfers: Sit to/from [facility] Sit to Stand: Max assist;+2 physical assistance Stand pivot transfers: Max assist       General transfer comment: maxAx2 for sit>stand from bed, with blocking of R knee, once knee locked out pt able to hold onto/hug therapist and maintain static standing with min-modA while wet briefs were removed, sat back down, attempted to [employer] for additional standing for pericare and unable to attain upright, maxA for stand pivot transfer to recliner,   Ambulation/Gait             General Gait Details: unable to attempt due to weakness and unfamilarity with RW use      Modified Rankin (Stroke Patients Only) Modified Rankin (Stroke Patients Only) Pre-Morbid Rankin Score: No symptoms Modified Rankin: Severe disability     Balance Overall balance assessment: Needs assistance Sitting-balance support: Feet supported;No upper extremity supported Sitting balance-Leahy Scale: Fair Sitting balance - Comments: able to attain fair seated balance with close supervision   Standing balance support: Bilateral upper extremity supported;During functional activity Standing balance-Leahy Scale: Poor Standing balance comment: with knee locked out and holding onto therapist with B UE pt able to maintain static stand                            Cognition Arousal/Alertness: Awake/alert Behavior During Therapy: Restless Overall Cognitive Status: No family/caregiver present to determine baseline cognitive functioning Area of Impairment: Attention;Following commands;Orientation  Orientation Level: Disoriented to;Situation;Place Current Attention Level: Selective   Following Commands: Follows one step commands inconsistently;Follows multi-step commands inconsistently       General Comments: pt slumped over in bed, but quickly aroused and interactive with therapy           General Comments General comments (skin integrity, edema, etc.): VSS on RA      Pertinent Vitals/Pain Pain Assessment: No/denies pain Faces Pain Scale: No hurt           PT Goals (current goals can now be found in the care plan section) Acute Rehab PT Goals Patient Stated Goal: unable PT Goal Formulation: Patient unable to participate in goal setting Time For Goal Achievement: 02/23/20 Potential to Achieve Goals: Fair Progress towards PT goals: Progressing toward goals    Frequency    Min 3X/week      PT Plan Current plan remains appropriate    Co-evaluation PT/OT/SLP Co-Evaluation/Treatment: Yes Reason for Co-Treatment: Necessary to address cognition/behavior during functional activity;For patient/therapist safety          AM-PAC PT "6 Clicks" Mobility   Outcome Measure  Help needed turning from your back to your side while in a flat bed without using bedrails?: Total Help needed moving from lying on your back to sitting on the side of a flat bed without using bedrails?: Total Help needed moving to and from a bed to a chair (including a wheelchair)?: Total Help needed standing up from a chair using your arms (e.g., wheelchair or bedside chair)?: Total Help needed to walk in hospital room?: Total Help needed climbing 3-5 steps with a railing? : Total 6 Click Score: 6    End of Session Equipment Utilized During Treatment: Gait belt Activity Tolerance: Patient tolerated treatment well(VSS) Patient left: in bed;with call bell/phone within reach;with bed alarm set Nurse Communication: Mobility status PT Visit Diagnosis: Other abnormalities of gait and mobility (R26.89);Other symptoms and signs involving the nervous system (U23.536)     Time: 1443-1540 PT Time Calculation (min) (ACUTE ONLY): 24 min  Charges:  $Therapeutic Activity: 8-22 mins                     [place] Stegemann B. Migdalia Dk PT, DPT Acute Rehabilitation Services Pager [phone] Office [phone]    [place] 02/11/2020, 2:14 PM

## 2020-02-11 NOTE — Progress Notes (Signed)
Nutrition Follow-up   RD working remotely.  DOCUMENTATION CODES:   Not applicable  INTERVENTION:  Provide Ensure Enlive po TID, each supplement provides 350 kcal and 20 grams of protein.  Encourage adequate PO intake.   NUTRITION DIAGNOSIS:   Inadequate oral intake related to inability to eat, lethargy/confusion as evidenced by NPO status; diet advanced; improving  GOAL:   Patient will meet greater than or equal to 90% of their needs; progressing  MONITOR:   Diet advancement, Labs, Weight trends, Skin, I & O's  REASON FOR ASSESSMENT:   Malnutrition Screening Tool, Consult(encephalopathy)    ASSESSMENT:   85 year old female with PMHx of HTN, DM, HLD, PAD, CKD, hx CVA, hx right BKA 11/02/2019 who is admitted for acute neurological deficits/acute encephalopathy, COVID-19 infection, DKA, AKI on CKD stage III. Per MD, pt more interactive today.   Diet advanced to a dysphagia 2 diet with thin liquids. Meal completion has been 25%. RD to order nutritional supplements to aid in caloric and protein needs.   Unable to complete Nutrition-Focused physical exam at this time.   Labs and medications reviewed.   Diet Order:   Diet Order            DIET DYS 2 Room service appropriate? No; Fluid consistency: Thin  Diet effective now              EDUCATION NEEDS:   No education needs have been identified at this time  Skin:  Skin Assessment: Reviewed RN Assessment  Last BM:  2/15  Height:   Ht Readings from Last 1 Encounters:  02/08/20 5\' 5"  (1.651 m)    Weight:   Wt Readings from Last 1 Encounters:  02/09/20 49.8 kg    Ideal Body Weight:  56.8 kg  BMI:  Body mass index is 18.27 kg/m.  Estimated Nutritional Needs:   Kcal:  1600-1800  Protein:  75-85 grams  Fluid:  >/= 1.6 L/day  Corrin Parker, MS, RD, LDN RD pager number/after hours weekend pager number on Amion.

## 2020-02-11 NOTE — Progress Notes (Signed)
CSW left voicemail for patient's son, Monique Cabrera [phone] regarding discharge plan and patient is still disoriented. Unable to reach daughters.   Monique Locus Maley Venezia LCSW [phone]

## 2020-02-11 NOTE — Progress Notes (Signed)
Called patient son for update on patient. Will continue to monitor patient.

## 2020-02-12 ENCOUNTER — Encounter: Payer: Medicare Other | Admitting: Family

## 2020-02-12 LAB — COMPREHENSIVE METABOLIC PANEL
ALT: 15 U/L (ref 0–44)
AST: 20 U/L (ref 15–41)
Albumin: 2.6 g/dL — ABNORMAL LOW (ref 3.5–5.0)
Alkaline Phosphatase: 54 U/L (ref 38–126)
Anion gap: 9 (ref 5–15)
BUN: 19 mg/dL (ref 8–23)
CO2: 21 mmol/L — ABNORMAL LOW (ref 22–32)
Calcium: 9.5 mg/dL (ref 8.9–10.3)
Chloride: 107 mmol/L (ref 98–111)
Creatinine, Ser: 1.6 mg/dL — ABNORMAL HIGH (ref 0.44–1.00)
GFR calc Af Amer: 34 mL/min — ABNORMAL LOW (ref >60)
GFR calc non Af Amer: 30 mL/min — ABNORMAL LOW (ref >60)
Glucose, Bld: 102 mg/dL — ABNORMAL HIGH (ref 70–99)
Potassium: 3.3 mmol/L — ABNORMAL LOW (ref 3.5–5.1)
Sodium: 137 mmol/L (ref 135–145)
Total Bilirubin: 0.9 mg/dL (ref 0.3–1.2)
Total Protein: 5.5 g/dL — ABNORMAL LOW (ref 6.5–8.1)

## 2020-02-12 LAB — PHOSPHORUS: Phosphorus: 2.3 mg/dL — ABNORMAL LOW (ref 2.5–4.6)

## 2020-02-12 LAB — FERRITIN: Ferritin: 759 ng/mL — ABNORMAL HIGH (ref 11–307)

## 2020-02-12 LAB — CBC WITH DIFFERENTIAL/PLATELET
Abs Immature Granulocytes: 0.11 10*3/uL — ABNORMAL HIGH (ref 0.00–0.07)
Basophils Absolute: 0 10*3/uL (ref 0.0–0.1)
Basophils Relative: 1 %
Eosinophils Absolute: 0.5 10*3/uL (ref 0.0–0.5)
Eosinophils Relative: 9 %
HCT: 32 % — ABNORMAL LOW (ref 36.0–46.0)
Hemoglobin: 11.1 g/dL — ABNORMAL LOW (ref 12.0–15.0)
Immature Granulocytes: 2 %
Lymphocytes Relative: 24 %
Lymphs Abs: 1.5 10*3/uL (ref 0.7–4.0)
MCH: 31.2 pg (ref 26.0–34.0)
MCHC: 34.7 g/dL (ref 30.0–36.0)
MCV: 89.9 fL (ref 80.0–100.0)
Monocytes Absolute: 0.6 10*3/uL (ref 0.1–1.0)
Monocytes Relative: 10 %
Neutro Abs: 3.4 10*3/uL (ref 1.7–7.7)
Neutrophils Relative %: 54 %
Platelets: 233 10*3/uL (ref 150–400)
RBC: 3.56 MIL/uL — ABNORMAL LOW (ref 3.87–5.11)
RDW: 12.4 % (ref 11.5–15.5)
WBC: 6.2 10*3/uL (ref 4.0–10.5)
nRBC: 0 % (ref 0.0–0.2)

## 2020-02-12 LAB — GLUCOSE, CAPILLARY
Glucose-Capillary: 89 mg/dL (ref 70–99)
Glucose-Capillary: 85 mg/dL (ref 70–99)
Glucose-Capillary: 202 mg/dL — ABNORMAL HIGH (ref 70–99)
Glucose-Capillary: 182 mg/dL — ABNORMAL HIGH (ref 70–99)
Glucose-Capillary: 170 mg/dL — ABNORMAL HIGH (ref 70–99)
Glucose-Capillary: 281 mg/dL — ABNORMAL HIGH (ref 70–99)

## 2020-02-12 LAB — D-DIMER, QUANTITATIVE: D-Dimer, Quant: 3.29 ug/mL-FEU — ABNORMAL HIGH (ref 0.00–0.50)

## 2020-02-12 LAB — C-REACTIVE PROTEIN: CRP: 0.8 mg/dL (ref <1.0)

## 2020-02-12 LAB — MAGNESIUM: Magnesium: 1.2 mg/dL — ABNORMAL LOW (ref 1.7–2.4)

## 2020-02-12 MED ORDER — POTASSIUM CHLORIDE CRYS ER 20 MEQ PO TBCR
40.0000 meq | EXTENDED_RELEASE_TABLET | ORAL | Status: AC
  Administered 2020-02-12 (×2): 40 meq via ORAL
  Filled 2020-02-12 (×2): qty 2

## 2020-02-12 MED ORDER — K PHOS MONO-SOD PHOS DI & MONO 155-852-130 MG PO TABS
500.0000 mg | ORAL_TABLET | Freq: Three times a day (TID) | ORAL | Status: AC
  Administered 2020-02-12 – 2020-02-13 (×6): 500 mg via ORAL
  Filled 2020-02-12 (×6): qty 2

## 2020-02-12 MED ORDER — GLUCERNA SHAKE PO LIQD
237.0000 mL | Freq: Three times a day (TID) | ORAL | Status: DC
  Administered 2020-02-12 – 2020-02-13 (×4): 237 mL via ORAL

## 2020-02-12 MED ORDER — MAGNESIUM SULFATE 4 GM/100ML IV SOLN
4.0000 g | Freq: Once | INTRAVENOUS | Status: AC
  Administered 2020-02-12: 4 g via INTRAVENOUS
  Filled 2020-02-12 (×2): qty 100

## 2020-02-12 MED ORDER — INSULIN ASPART 100 UNIT/ML ~~LOC~~ SOLN
0.0000 [IU] | Freq: Three times a day (TID) | SUBCUTANEOUS | Status: DC
  Administered 2020-02-13: 9 [IU] via SUBCUTANEOUS
  Administered 2020-02-13: 3 [IU] via SUBCUTANEOUS
  Administered 2020-02-13: 1 [IU] via SUBCUTANEOUS
  Administered 2020-02-14: 3 [IU] via SUBCUTANEOUS
  Administered 2020-02-14: 2 [IU] via SUBCUTANEOUS

## 2020-02-12 NOTE — Progress Notes (Signed)
PROGRESS NOTE                                                                                                                                                                                                             Patient Demographics:    Monique Cabrera, is a 85 y.o. female, DOB - 01/30/1935, [id number]:[id number]  Admit date - 02/08/2020   Admitting Physician Karmen Bongo, MD  Outpatient Primary MD for the patient is [name], DO  LOS - 4   Chief Complaint  Patient presents with  . Code Stroke       Brief Narrative    85 y.o. female with medical history significant of HTN; DM; CVA (2018); PAD s/p R BKA (10/2019); HLD; HTN; and stage 2 CKD presenting with unresponsiveness and right-sided weakness  , concerning for CVA.  She is obtunded and essentially unresponsive and so unable to answer questions.  Patient was seen by neurology, with clinical suspicion for CVA, admitted for CVA work-up, MRI done on 2/14 a.m., with no evidence of acute CVA, was seen by neurology, think most likely this is related to seizures with Todd's paralysis.   Subjective:    Monique Cabrera today was confused overnight, but this morning appears to be more appropriate.   Assessment  & Plan :    Principal Problem:   Acute metabolic encephalopathy Active Problems:   Peripheral arterial disease (HCC)   Hypertension   Type II diabetes mellitus with renal manifestations, uncontrolled (HCC)   Hyperlipidemia   COVID-19 virus infection   Acute kidney injury superimposed on CKD ([place])   acute encephalopathy concerning for seizures with Todd's paralysis -Presented with unresponsiveness, found down at home, with b/b incontinence as well as right sided weakness, now resolved. -CT head with no acute intracranial abnormalities. CTA head and neck with no high-grade stenosis or occlusion.  No indication for carotid Dopplers. -MRI head with no evidence of acute CVA. -So far no  clear etiology of her encephalopathy,discussed with neurology, this point they feel video EEG is negative help given patient is back to baseline. -Hypertensive encephalopathy, DKA encephalopathy, COVID-19 encephalopathy could be contributing factors as well. -Started on low-dose Keppra. -Continue with aspirin and Plavix -Seen by SLP, started on diet. -Mentation  is improving, but not back to baseline yet.  COVID-19 infection  -So far no  hypoxia, no indication for steroids -Continue with IV remdesivir. -Dimers elevated at 4.5 on presentation, is currently trending down, venous Doppler with no evidence of DVT.  HTN -Blood pressure elevated, started back on Norvasc and Coreg . -Continue to hold Diovan/hydrochlorothiazide given AKI.  HLD -LDL is 153, resume statin when able to take oral  DM with mild DKA on admission -Patient is known to be diabetic, anion gap elevated 18 on presentation, with evidence of ketonuria and hyperglycemia.  Anion gap appears to be currently normalized. -Have stopped  her Lantus given hypoglycemia, continue with a sliding scale. -Appears to be poorly controlled with hemoglobin A1c of 11  PAD -s/p R BKA with good stump healing  AKI on Stage 3a CKD -Likely associated with COVID-19 infection -Since the patient is too obtunded to take PO right now, will start judicious IVF  Hypokalemia/hypomagnesemia/hypophosphatemia -Repleting, continue to monitor.     COVID-19 Labs  Recent Labs    02/10/20 0435 02/11/20 0657 02/12/20 0332  DDIMER 2.24* 2.65* 3.29*  FERRITIN 887* 819* 759*  CRP 2.5* 1.3* 0.8    Lab Results  Component Value Date   SARSCOV2NAA POSITIVE (A) 02/08/2020   [place] NEGATIVE 10/29/2019     Code Status : DNR  Family Communication  : D/W son Mollie [place] [phone] on 2/14, tried to reach today, left a voicemail 2/16.  Disposition Plan  : will need SNF  Barriers For Discharge : Patient mentation is back to baseline,  oral intake remains poor, remains on IV fluid, will need SNF placement once improved .  Consults  :  Neurology  Procedures  : None  DVT Prophylaxis  :  [place] lovenox  Lab Results  Component Value Date   PLT 233 02/12/2020    Antibiotics  :    Anti-infectives (From admission, onward)   Start     Dose/Rate Route Frequency Ordered Stop   02/09/20 1000  remdesivir 100 mg in sodium chloride 0.9 % 100 mL IVPB     100 mg 200 mL/hr over 30 Minutes Intravenous Daily 02/08/20 1924 02/12/20 1012   02/08/20 2000  remdesivir 200 mg in sodium chloride 0.9% 250 mL IVPB     200 mg 580 mL/hr over 30 Minutes Intravenous Once 02/08/20 1924 02/08/20 2312        Objective:   Vitals:   02/11/20 2002 02/12/20 0414 02/12/20 0619 02/12/20 0842  BP: (!) 126/91  (!) 156/62   Pulse: 86   66  Resp: 17 17 17 13   Temp: 99.1 F (37.3 C)  97.9 F (36.6 C)   TempSrc: Axillary  Oral   SpO2: 99%  99% 100%  Weight:  52.3 kg    Height:        Wt Readings from Last 3 Encounters:  02/12/20 52.3 kg  01/24/20 60.3 kg  12/04/19 60.3 kg     Intake/Output Summary (Last 24 hours) at 02/12/2020 1214 Last data filed at 02/12/2020 1000 Gross per 24 hour  Intake 1935 ml  Output --  Net 1935 ml     Physical Exam  Awake Alert, more oriented, appropriate and coherent this morning, but still easily distracted Symmetrical Chest wall movement, Good air movement bilaterally, CTAB RRR,No Gallops,Rubs or new Murmurs, No Parasternal Heave +ve B.Sounds, Abd Soft, No tenderness, No rebound - guarding or rigidity.  No Cyanosis, Clubbing or edema, No new Rash or bruise, right BKA     Data Review:    CBC Recent Labs  Lab 02/08/20 0950 02/08/20  6967 02/08/20 0953 02/09/20 1242 02/10/20 0435 02/11/20 0657 02/12/20 0332  WBC 17.2*  --   --  14.3* 11.3* 6.9 6.2  HGB 12.5   < > 12.9 12.2 10.9* 11.1* 11.1*  HCT 36.5   < > 38.0 35.0* 31.9* 32.2* 32.0*  PLT 309  --   --  282 232 228 233  MCV 90.3  --   --   88.4 91.1 90.4 89.9  MCH 30.9  --   --  30.8 31.1 31.2 31.2  MCHC 34.2  --   --  34.9 34.2 34.5 34.7  RDW 12.4  --   --  12.5 12.8 12.6 12.4  LYMPHSABS 1.2  --   --  1.5 1.9 1.5 1.5  MONOABS 0.8  --   --  1.1* 0.9 0.5 0.6  EOSABS 0.0  --   --  0.0 0.0 0.5 0.5  BASOSABS 0.0  --   --  0.0 0.0 0.0 0.0   < > = values in this interval not displayed.    Chemistries  Recent Labs  Lab 02/08/20 0950 02/08/20 0953 02/09/20 1242 02/10/20 0435 02/10/20 1805 02/11/20 0657 02/12/20 0332  NA 132*   < > 140 144 140 140 137  K 4.0   < > 3.3* 3.0* 3.5 3.7 3.3*  CL 93*   < > 101 111 109 112* 107  CO2 21*   < > 24 23 19* 20* 21*  GLUCOSE 443*   < > 223* 99 61* 103* 102*  BUN 21   < > 31* 30* 28* 19 19  CREATININE 1.70*   < > 1.80* 1.90* 1.57* 1.40* 1.60*  CALCIUM 10.8*   < > 10.8* 10.5* 9.8 9.6 9.5  MG  --   --   --   --   --  1.2* 1.2*  AST 22  --  18 17  --  21 20  ALT 17  --  13 11  --  14 15  ALKPHOS 69  --  62 60  --  54 54  BILITOT 1.2  --  0.5 0.3  --  1.0 0.9   < > = values in this interval not displayed.   ------------------------------------------------------------------------------------------------------------------ Recent Labs    02/09/20 1242  CHOL 242*  HDL 69  LDLCALC 153*  TRIG 98  CHOLHDL 3.5    Lab Results  Component Value Date   HGBA1C 11.0 (H) 02/09/2020   ------------------------------------------------------------------------------------------------------------------ No results for input(s): TSH, T4TOTAL, T3FREE, THYROIDAB in the last 72 hours.  Invalid input(s): FREET3 ------------------------------------------------------------------------------------------------------------------ Recent Labs    02/11/20 0657 02/12/20 0332  FERRITIN 819* 759*    Coagulation profile Recent Labs  Lab 02/08/20 0950  INR 1.0    Recent Labs    02/11/20 0657 02/12/20 0332  DDIMER 2.65* 3.29*    Cardiac Enzymes No results for input(s): CKMB, TROPONINI,  MYOGLOBIN in the last 168 hours.  Invalid input(s): CK ------------------------------------------------------------------------------------------------------------------ No results found for: BNP  Inpatient Medications  Scheduled Meds: . amLODipine  10 mg Oral Daily  . aspirin EC  81 mg Oral Daily  . atorvastatin  20 mg Oral q1800  . carvedilol  25 mg Oral BID WC  . clopidogrel  75 mg Oral Daily  . enoxaparin (LOVENOX) injection  30 mg Subcutaneous Q24H  . feeding supplement (ENSURE ENLIVE)  237 mL Oral TID BM  . insulin aspart  0-9 Units Subcutaneous Q4H  . levETIRAcetam  250 mg Oral BID  . phosphorus  500 mg Oral TID  . potassium chloride  40 mEq Oral Q4H  . sodium chloride flush  3 mL Intravenous Q12H   Continuous Infusions: . dextrose 5 % and 0.9 % NaCl with KCl 40 mEq/L 125 mL/hr at 02/12/20 0345   PRN Meds:.acetaminophen **OR** acetaminophen (TYLENOL) oral liquid 160 mg/5 mL **OR** acetaminophen, dextrose, hydrALAZINE, ondansetron (ZOFRAN) IV, senna-docusate  Micro Results Recent Results (from the past 240 hour(s))  Respiratory Panel by RT PCR (Flu A&B, Covid) - Nasopharyngeal Swab     Status: Abnormal   Collection Time: 02/08/20  9:45 AM   Specimen: Nasopharyngeal Swab  Result Value Ref Range Status   SARS Coronavirus 2 by RT PCR POSITIVE (A) NEGATIVE Final    Comment: RESULT CALLED TO, READ BACK BY AND VERIFIED WITH: A. Florene Glen RN 11:50 02/08/20 ([email or web address]) (NOTE) SARS-CoV-2 target nucleic acids are DETECTED. SARS-CoV-2 RNA is generally detectable in upper respiratory specimens  during the acute phase of infection. Positive results are indicative of the presence of the identified virus, but do not rule out bacterial infection or co-infection with other pathogens not detected by the test. Clinical correlation with patient history and other diagnostic information is necessary to determine patient infection status. The expected result is Negative. Fact Sheet for  Patients:  PinkCheek.be Fact Sheet for Healthcare Providers: GravelBags.it This test is not yet approved or cleared by the [place] FDA and  has been authorized for detection and/or diagnosis of SARS-CoV-2 by FDA under an Emergency Use Authorization (EUA).  This EUA will remain in effect (meaning this test can be used)  for the duration of  the COVID-19 declaration under Section 564(b)(1) of the Act, 21 U.S.C. section 360bbb-3(b)(1), unless the authorization is terminated or revoked sooner.    Influenza A by PCR NEGATIVE NEGATIVE Final   Influenza B by PCR NEGATIVE NEGATIVE Final    Comment: (NOTE) The Xpert Xpress SARS-CoV-2/FLU/RSV assay is intended as an aid in  the diagnosis of influenza from Nasopharyngeal swab specimens and  should not be used as a sole basis for treatment. Nasal washings and  aspirates are unacceptable for Xpert Xpress SARS-CoV-2/FLU/RSV  testing. Fact Sheet for Patients: PinkCheek.be Fact Sheet for Healthcare Providers: GravelBags.it This test is not yet approved or cleared by the [place] FDA and  has been authorized for detection and/or diagnosis of SARS-CoV-2 by  FDA under an Emergency Use Authorization (EUA). This EUA will remain  in effect (meaning this test can be used) for the duration of the  Covid-19 declaration under Section 564(b)(1) of the Act, 21  U.S.C. section 360bbb-3(b)(1), unless the authorization is  terminated or revoked. Performed at [facility], [place] [address] [place], [place] [address]     Radiology Reports CT ANGIO HEAD W OR WO CONTRAST  Addendum Date: 02/08/2020   ADDENDUM REPORT: 02/08/2020 14:54 ADDENDUM: A 2 mm left MCA bifurcation aneurysm is stable from the prior exam. This is noted on further inquiry for explanation of right-sided symptoms. This is unlikely to be related to the patient's  symptoms. Electronically Signed   By: San Morelle M.D.   On: 02/08/2020 14:54   Result Date: 02/08/2020 CLINICAL DATA:  Right-sided weakness. EXAM: CT ANGIOGRAPHY HEAD AND NECK TECHNIQUE: Multidetector CT imaging of the head and neck was performed using the standard protocol during bolus administration of intravenous contrast. Multiplanar CT image reconstructions and MIPs were obtained to evaluate the vascular anatomy. Carotid stenosis measurements (when applicable) are obtained utilizing NASCET criteria, using the  distal internal carotid diameter as the denominator. CONTRAST:  37mL OMNIPAQUE IOHEXOL 350 MG/ML SOLN COMPARISON:  None. FINDINGS: CTA NECK FINDINGS Aortic arch: Atherosclerotic calcifications are present within the aortic arch and at the origins the great vessels without significant stenosis or change. There is no aneurysm. Right carotid system: Right common carotid artery is within normal limits. Bifurcation is somewhat obscured by patient motion. No significant stenosis is present. Mild tortuosity is present in the cervical right ICA without significant stenosis. Left carotid system: Left common carotid artery is tortuous. Atherosclerotic changes are present at the bifurcation without significant stenosis. There is mild tortuosity of the cervical left ICA without significant stenosis. Vertebral arteries: The left vertebral artery is the dominant vessel. Both vertebral arteries originate from the subclavian arteries without significant stenosis. There is no definite stenosis in the neck. Skeleton: Degenerative changes are present lower cervical spine. Osseous structures are somewhat distorted by patient motion. No acute abnormalities are present. Other neck: The soft tissues the neck are otherwise unremarkable. Upper chest: Centrilobular emphysematous changes are present. No nodule or mass lesion is present. Thoracic inlet is normal. Review of the MIP images confirms the above findings CTA  HEAD FINDINGS Anterior circulation: Atherosclerotic changes are present within the cavernous internal carotid arteries bilaterally without significant stenosis. Mild narrowing is present in the proximal right A1 segment. No emergent large vessel occlusion is present. There is some attenuation of distal small vessels. Posterior circulation: The left vertebral artery is the dominant vessel. No significant stenosis is present. There is mild narrowing the mid basilar artery. Both posterior cerebral arteries originate from basilar tip. Distal vessel attenuation is present without a significant proximal stenosis or occlusion. Venous sinuses: The dural sinuses patent. The straight sinus and deep cerebral veins are intact. Cortical veins are distorted by patient motion. Anatomic variants: None Review of the MIP images confirms the above findings IMPRESSION: 1. No emergent large vessel occlusion. 2. Atherosclerotic changes at the aortic arch, left carotid bifurcation, and cavernous internal carotid arteries bilaterally without significant stenosis. 3. Mild narrowing of the proximal right A1 segment. 4. Distal small vessel attenuation without a significant proximal stenosis, aneurysm, or branch vessel occlusion within the Circle of Willis. These results were called by telephone at the time of interpretation on 02/08/2020 at 10:05am to provider Dr. Lorraine Lax, Who verbally acknowledged these results. Electronically Signed: By: San Morelle M.D. On: 02/08/2020 10:34   CT Angio Neck W and/or Wo Contrast  Addendum Date: 02/08/2020   ADDENDUM REPORT: 02/08/2020 14:54 ADDENDUM: A 2 mm left MCA bifurcation aneurysm is stable from the prior exam. This is noted on further inquiry for explanation of right-sided symptoms. This is unlikely to be related to the patient's symptoms. Electronically Signed   By: San Morelle M.D.   On: 02/08/2020 14:54   Result Date: 02/08/2020 CLINICAL DATA:  Right-sided weakness. EXAM: CT  ANGIOGRAPHY HEAD AND NECK TECHNIQUE: Multidetector CT imaging of the head and neck was performed using the standard protocol during bolus administration of intravenous contrast. Multiplanar CT image reconstructions and MIPs were obtained to evaluate the vascular anatomy. Carotid stenosis measurements (when applicable) are obtained utilizing NASCET criteria, using the distal internal carotid diameter as the denominator. CONTRAST:  7mL OMNIPAQUE IOHEXOL 350 MG/ML SOLN COMPARISON:  None. FINDINGS: CTA NECK FINDINGS Aortic arch: Atherosclerotic calcifications are present within the aortic arch and at the origins the great vessels without significant stenosis or change. There is no aneurysm. Right carotid system: Right common carotid artery is within  normal limits. Bifurcation is somewhat obscured by patient motion. No significant stenosis is present. Mild tortuosity is present in the cervical right ICA without significant stenosis. Left carotid system: Left common carotid artery is tortuous. Atherosclerotic changes are present at the bifurcation without significant stenosis. There is mild tortuosity of the cervical left ICA without significant stenosis. Vertebral arteries: The left vertebral artery is the dominant vessel. Both vertebral arteries originate from the subclavian arteries without significant stenosis. There is no definite stenosis in the neck. Skeleton: Degenerative changes are present lower cervical spine. Osseous structures are somewhat distorted by patient motion. No acute abnormalities are present. Other neck: The soft tissues the neck are otherwise unremarkable. Upper chest: Centrilobular emphysematous changes are present. No nodule or mass lesion is present. Thoracic inlet is normal. Review of the MIP images confirms the above findings CTA HEAD FINDINGS Anterior circulation: Atherosclerotic changes are present within the cavernous internal carotid arteries bilaterally without significant stenosis.  Mild narrowing is present in the proximal right A1 segment. No emergent large vessel occlusion is present. There is some attenuation of distal small vessels. Posterior circulation: The left vertebral artery is the dominant vessel. No significant stenosis is present. There is mild narrowing the mid basilar artery. Both posterior cerebral arteries originate from basilar tip. Distal vessel attenuation is present without a significant proximal stenosis or occlusion. Venous sinuses: The dural sinuses patent. The straight sinus and deep cerebral veins are intact. Cortical veins are distorted by patient motion. Anatomic variants: None Review of the MIP images confirms the above findings IMPRESSION: 1. No emergent large vessel occlusion. 2. Atherosclerotic changes at the aortic arch, left carotid bifurcation, and cavernous internal carotid arteries bilaterally without significant stenosis. 3. Mild narrowing of the proximal right A1 segment. 4. Distal small vessel attenuation without a significant proximal stenosis, aneurysm, or branch vessel occlusion within the Circle of Willis. These results were called by telephone at the time of interpretation on 02/08/2020 at 10:05am to provider Dr. Lorraine Lax, Who verbally acknowledged these results. Electronically Signed: By: San Morelle M.D. On: 02/08/2020 10:34   MR BRAIN WO CONTRAST  Result Date: 02/10/2020 CLINICAL DATA:  Acute presentation with worsened level of consciousness and speech disturbance. EXAM: MRI HEAD WITHOUT CONTRAST TECHNIQUE: Multiplanar, multiecho pulse sequences of the brain and surrounding structures were obtained without intravenous contrast. COMPARISON:  CT studies 02/08/2020 FINDINGS: Brain: Diffusion imaging does not show any acute or subacute infarction. Chronic small-vessel ischemic changes affect the pons. No focal cerebellar insult. Cerebral hemispheres show generalized atrophy with mild chronic small-vessel change of the white matter  considering age. No cortical or large vessel territory infarction. No mass lesion, hemorrhage, hydrocephalus or extra-axial collection. Vascular: Major vessels at the base of the brain show flow. Skull and upper cervical spine: Negative Sinuses/Orbits: Clear/normal Other: None IMPRESSION: No acute finding. Age related atrophy. Chronic small-vessel ischemic changes of the pons and cerebral hemispheric white matter, fairly mild considering age. Electronically Signed   By: Nelson Chimes M.D.   On: 02/10/2020 03:55   DG CHEST PORT 1 VIEW  Result Date: 02/08/2020 CLINICAL DATA:  COVID-19 positive, altered mental status EXAM: PORTABLE CHEST 1 VIEW COMPARISON:  Radiograph 10/04/2019 FINDINGS: Coarse interstitial changes are similar to priors. No consolidation, features of edema, pneumothorax, or effusion. Pulmonary vascularity is normally distributed. The aorta is calcified. The remaining cardiomediastinal contours are unremarkable. No acute osseous or soft tissue abnormality. No acute osseous or soft tissue abnormality. IMPRESSION: No acute cardiopulmonary findings. Coarse interstitial changes are similar  to priors. Aortic Atherosclerosis (ICD10-I70.0). Electronically Signed   By: Lovena Le M.D.   On: 02/08/2020 19:47   DG Abd Portable 1V  Result Date: 02/09/2020 CLINICAL DATA:  Metal screening prior to MRI. EXAM: PORTABLE ABDOMEN - 1 VIEW COMPARISON:  CT abdomen and pelvis 08/21/2012 FINDINGS: No radiopaque foreign body is identified in the abdomen or pelvis with incomplete imaging of the inferior pelvic ring. No dilated loops of bowel are seen to suggest obstruction. Atherosclerotic vascular calcifications are noted. The visualized lung bases are grossly clear. IMPRESSION: No metallic foreign body identified. Electronically Signed   By: Logan Bores M.D.   On: 02/09/2020 10:04   EEG adult  Result Date: 02/08/2020 Lora Havens, MD     02/08/2020  1:32 PM Patient Name: POLETTE NOFSINGER MRN: [id number]  Epilepsy Attending: Lora Havens Referring Physician/Provider: Laurey Morale, NP Date: 02/08/2020 Duration: 36.20 minutes Patient history: 85 year old female with prior medical history of stroke who presented with unresponsiveness and right-sided weakness.  CT head negative for hemorrhage.  EEG evaluate for seizures. Level of alertness: Lethargic AEDs during EEG study: None Technical aspects: This EEG study was done with scalp electrodes positioned according to the 10-20 International system of electrode placement. Electrical activity was acquired at a sampling rate of 500Hz  and reviewed with a high frequency filter of 70Hz  and a low frequency filter of 1Hz . EEG data were recorded continuously and digitally stored. Description: EEG showed continuous generalized low amplitude 2 to 3 Hz delta slowing as well as 13 to 15 Hz frontocentral beta activity.  Hyperventilation and photic stimulation were not performed. Of note in the beginning, study was technically difficult due to significant myogenic artifact. Abnormality -Continuous slow, generalized IMPRESSION: This study is suggestive of moderate to severe diffuse encephalopathy, nonspecific etiology. No seizures or epileptiform discharges were seen throughout the recording. Lora Havens   CT HEAD CODE STROKE WO CONTRAST  Result Date: 02/08/2020 CLINICAL DATA:  Code stroke. Neuro deficit, acute, stroke suspected. EXAM: CT HEAD WITHOUT CONTRAST TECHNIQUE: Contiguous axial images were obtained from the base of the skull through the vertex without intravenous contrast. COMPARISON:  None. FINDINGS: Brain: No acute infarct, hemorrhage, or mass lesion is present. Moderate generalized atrophy and white matter disease is stable. The ventricles are proportionate to the degree of atrophy. Basal ganglia and insular cortex is bilaterally. No acute or focal cortical abnormality is present. No significant extraaxial fluid collection is present. The brainstem and  cerebellum are within normal limits. Vascular: Extensive vascular calcifications extend to the MCA bifurcations bilaterally without significant interval change. No other asymmetric hyperdense vessel is present. Dense calcifications in the basilar artery vertebral arteries are also stable. Skull: Insert normal skull No significant extracranial soft tissue lesion is present. Sinuses/Orbits: The paranasal sinuses and mastoid air cells are clear. Bilateral lens replacements are noted. Globes and orbits are otherwise unremarkable. Other: ASPECTS ([place] Stroke Program Early CT Score) - Ganglionic level infarction (caudate, lentiform nuclei, internal capsule, insula, M1-M3 cortex): 3/3 - Supraganglionic infarction (M4-M6 cortex): 7/7 Total score (0-10 with 10 being normal): 10/10 IMPRESSION: 1. No acute intracranial abnormality or significant interval change. 2. Stable advanced atrophy and diffuse white matter disease. 3. Advanced calcific atherosclerotic changes extend to the MCA bifurcations and the basilar artery are stable. 4. ASPECTS is 10/10 1. The above was relayed via text pager to Dr. Lorraine Lax on 02/08/2020 at 10:03 . Electronically Signed   By: San Morelle M.D.   On: 02/08/2020 10:04  VAS [place] LOWER EXTREMITY VENOUS (DVT)  Result Date: 02/11/2020  Lower Venous DVTStudy Indications: Elevated D-Dimer, Covid+.  Risk Factors: Surgery Right below knee amputation. Limitations: Dementia, uncooperative. Comparison Study: No prior exams. Performing Technologist: Baldwin Crown RDMS, RVT  Examination Guidelines: A complete evaluation includes B-mode imaging, spectral Doppler, color Doppler, and power Doppler as needed of all accessible portions of each vessel. Bilateral testing is considered an integral part of a complete examination. Limited examinations for reoccurring indications may be performed as noted. The reflux portion of the exam is performed with the patient in reverse Trendelenburg.   +---------+---------------+---------+-----------+----------+--------------+ RIGHT    CompressibilityPhasicitySpontaneityPropertiesThrombus Aging +---------+---------------+---------+-----------+----------+--------------+ CFV      Full           Yes      Yes                                 +---------+---------------+---------+-----------+----------+--------------+ SFJ      Full                                                        +---------+---------------+---------+-----------+----------+--------------+ FV Prox  Full                                                        +---------+---------------+---------+-----------+----------+--------------+ FV Mid   Full                                                        +---------+---------------+---------+-----------+----------+--------------+ FV DistalFull                                                        +---------+---------------+---------+-----------+----------+--------------+ PFV      Full                                                        +---------+---------------+---------+-----------+----------+--------------+ POP      Full           Yes      Yes                                 +---------+---------------+---------+-----------+----------+--------------+   +---------+---------------+---------+-----------+----------+--------------+ LEFT     CompressibilityPhasicitySpontaneityPropertiesThrombus Aging +---------+---------------+---------+-----------+----------+--------------+ CFV      Full           Yes      Yes                                 +---------+---------------+---------+-----------+----------+--------------+ SFJ  Full                                                        +---------+---------------+---------+-----------+----------+--------------+ FV Prox  Full                                                         +---------+---------------+---------+-----------+----------+--------------+ FV Mid   Full                                                        +---------+---------------+---------+-----------+----------+--------------+ FV DistalFull                                                        +---------+---------------+---------+-----------+----------+--------------+ PFV      Full                                                        +---------+---------------+---------+-----------+----------+--------------+ POP      Full           Yes      Yes                                 +---------+---------------+---------+-----------+----------+--------------+ PTV      Full                                                        +---------+---------------+---------+-----------+----------+--------------+ PERO     Full                                                        +---------+---------------+---------+-----------+----------+--------------+     Summary: BILATERAL: - No evidence of deep vein thrombosis seen in the lower extremities, bilaterally.  RIGHT: - No cystic structure found in the popliteal fossa. - Right below knee amputation.  LEFT: - No cystic structure found in the popliteal fossa.  *See table(s) above for measurements and observations. Electronically signed by Ruta Hinds MD on 02/11/2020 at 5:17:49 PM.    Final    ECHOCARDIOGRAM LIMITED  Result Date: 02/09/2020    ECHOCARDIOGRAM LIMITED REPORT   Patient Name:   AMEY HOSSAIN Date of Exam: 02/09/2020 Medical Rec #:  [id number]        Height:       65.0 in  Accession #:    [id number]       Weight:       132.3 lb Date of Birth:  [birth year]        BSA:          1.66 m Patient Age:    59 years         BP:           209/93 mmHg Patient Gender: F                HR:           94 bpm. Exam Location:  Inpatient Procedure: Limited Echo and Color Doppler Indications:    CVA  History:        Patient has prior history of  Echocardiogram examinations, most                 recent 02/18/2017. Stroke and PAD; Risk Factors:Hypertension,                 Diabetes and Dyslipidemia.  Sonographer:    Dustin Flock Referring Phys: [place] Comments: Image acquisition challenging due to uncooperative patient and cant follow commands. IMPRESSIONS  1. Left ventricular ejection fraction, by estimation, is 60 to 65%. The left ventricle has normal function. The left ventricle has no regional wall motion abnormalities. Left ventricular diastolic function could not be evaluated.  2. Right ventricular systolic function is normal. The right ventricular size is normal.  3. The mitral valve is normal in structure and function. No evidence of mitral valve regurgitation. No evidence of mitral stenosis.  4. The aortic valve is normal in structure and function. Aortic valve regurgitation is not visualized. Mild aortic valve sclerosis is present, with no evidence of aortic valve stenosis.  5. The inferior vena cava is normal in size with greater than 50% respiratory variability, suggesting right atrial pressure of 3 mmHg. Conclusion(s)/Recomendation(s): Limited study due to poor patient cooperation. FINDINGS  Left Ventricle: Left ventricular ejection fraction, by estimation, is 60 to 65%. The left ventricle has normal function. The left ventricle has no regional wall motion abnormalities. The left ventricular internal cavity size was normal in size. There is  no left ventricular hypertrophy. The left ventricular diastology could not be evaluated due to indeterminate diastolic function. Right Ventricle: The right ventricular size is normal. No increase in right ventricular wall thickness. Right ventricular systolic function is normal. Left Atrium: Left atrial size was normal in size. Right Atrium: Right atrial size was normal in size. Pericardium: There is no evidence of pericardial effusion. Mitral Valve: The mitral valve is normal  in structure and function. Normal mobility of the mitral valve leaflets. No evidence of mitral valve stenosis. Tricuspid Valve: The tricuspid valve is normal in structure. Tricuspid valve regurgitation is not demonstrated. No evidence of tricuspid stenosis. Aortic Valve: The aortic valve is normal in structure and function. Aortic valve regurgitation is not visualized. Mild aortic valve sclerosis is present, with no evidence of aortic valve stenosis. Pulmonic Valve: The pulmonic valve was normal in structure. Pulmonic valve regurgitation is not visualized. No evidence of pulmonic stenosis. Aorta: The aortic root is normal in size and structure. Venous: The inferior vena cava is normal in size with greater than 50% respiratory variability, suggesting right atrial pressure of 3 mmHg. IAS/Shunts: No atrial level shunt detected by color flow Doppler.  LEFT VENTRICLE PLAX 2D LVIDd:         3.27 cm Diastology LVIDs:  2.15 cm LV e' lateral: 6.31 cm/s LV PW:         1.07 cm LV e' medial:  3.48 cm/s LV IVS:        1.24 cm  Dani Gobble Croitoru MD Electronically signed by Sanda Klein MD Signature Date/Time: 02/09/2020/12:54:09 PM    Final       Phillips Climes M.D on 02/12/2020 at 12:14 PM  Between 7am to 7pm - Pager - [phone]  After 7pm go to [email or web address] - password [facility]  [facility] -  Office  [phone]

## 2020-02-12 NOTE — Progress Notes (Signed)
   02/12/20 1100  Family/Significant Other Communication  Family/Significant Other Update Called;Updated   Called and updated family on Plan of Care. All questions asked and all questions answered.

## 2020-02-12 NOTE — Progress Notes (Signed)
Inpatient Rehab Admissions:  Inpatient Rehab Consult received.  Note that pt's most recent (+) test was on 2/12.  Per CIR guidelines, pt must have 2 negative tests OR be >20 days from positive test (3/5) with recovery/improvement in symptoms prior to admission.  If pt remains in house, will f/u with patient at that time.   Signed: Shann Medal, PT, DPT Admissions Coordinator [phone] 02/12/20  9:09 AM

## 2020-02-12 NOTE — Progress Notes (Signed)
Left a second voicemail for patient's son [name].   Percell Locus Jaymen Fetch LCSW [phone]

## 2020-02-13 DIAGNOSIS — U071 COVID-19: Principal | ICD-10-CM

## 2020-02-13 DIAGNOSIS — N179 Acute kidney failure, unspecified: Secondary | ICD-10-CM

## 2020-02-13 DIAGNOSIS — N189 Chronic kidney disease, unspecified: Secondary | ICD-10-CM

## 2020-02-13 LAB — CBC WITH DIFFERENTIAL/PLATELET
Abs Immature Granulocytes: 0.13 10*3/uL — ABNORMAL HIGH (ref 0.00–0.07)
Basophils Absolute: 0 10*3/uL (ref 0.0–0.1)
Basophils Relative: 1 %
Eosinophils Absolute: 0.5 10*3/uL (ref 0.0–0.5)
Eosinophils Relative: 8 %
HCT: 30 % — ABNORMAL LOW (ref 36.0–46.0)
Hemoglobin: 10.1 g/dL — ABNORMAL LOW (ref 12.0–15.0)
Immature Granulocytes: 2 %
Lymphocytes Relative: 26 %
Lymphs Abs: 1.6 10*3/uL (ref 0.7–4.0)
MCH: 30.6 pg (ref 26.0–34.0)
MCHC: 33.7 g/dL (ref 30.0–36.0)
MCV: 90.9 fL (ref 80.0–100.0)
Monocytes Absolute: 0.6 10*3/uL (ref 0.1–1.0)
Monocytes Relative: 10 %
Neutro Abs: 3.3 10*3/uL (ref 1.7–7.7)
Neutrophils Relative %: 53 %
Platelets: 220 10*3/uL (ref 150–400)
RBC: 3.3 MIL/uL — ABNORMAL LOW (ref 3.87–5.11)
RDW: 12.3 % (ref 11.5–15.5)
WBC: 6.2 10*3/uL (ref 4.0–10.5)
nRBC: 0 % (ref 0.0–0.2)

## 2020-02-13 LAB — COMPREHENSIVE METABOLIC PANEL
ALT: 14 U/L (ref 0–44)
AST: 20 U/L (ref 15–41)
Albumin: 2.2 g/dL — ABNORMAL LOW (ref 3.5–5.0)
Alkaline Phosphatase: 54 U/L (ref 38–126)
Anion gap: 7 (ref 5–15)
BUN: 17 mg/dL (ref 8–23)
CO2: 21 mmol/L — ABNORMAL LOW (ref 22–32)
Calcium: 9.2 mg/dL (ref 8.9–10.3)
Chloride: 110 mmol/L (ref 98–111)
Creatinine, Ser: 1.59 mg/dL — ABNORMAL HIGH (ref 0.44–1.00)
GFR calc Af Amer: 35 mL/min — ABNORMAL LOW (ref >60)
GFR calc non Af Amer: 30 mL/min — ABNORMAL LOW (ref >60)
Glucose, Bld: 158 mg/dL — ABNORMAL HIGH (ref 70–99)
Potassium: 4.1 mmol/L (ref 3.5–5.1)
Sodium: 138 mmol/L (ref 135–145)
Total Bilirubin: 0.7 mg/dL (ref 0.3–1.2)
Total Protein: 5 g/dL — ABNORMAL LOW (ref 6.5–8.1)

## 2020-02-13 LAB — GLUCOSE, CAPILLARY
Glucose-Capillary: 140 mg/dL — ABNORMAL HIGH (ref 70–99)
Glucose-Capillary: 152 mg/dL — ABNORMAL HIGH (ref 70–99)
Glucose-Capillary: 227 mg/dL — ABNORMAL HIGH (ref 70–99)
Glucose-Capillary: 400 mg/dL — ABNORMAL HIGH (ref 70–99)
Glucose-Capillary: 174 mg/dL — ABNORMAL HIGH (ref 70–99)

## 2020-02-13 LAB — MAGNESIUM: Magnesium: 1.4 mg/dL — ABNORMAL LOW (ref 1.7–2.4)

## 2020-02-13 LAB — D-DIMER, QUANTITATIVE: D-Dimer, Quant: 3.6 ug/mL-FEU — ABNORMAL HIGH (ref 0.00–0.50)

## 2020-02-13 LAB — C-REACTIVE PROTEIN: CRP: 0.6 mg/dL (ref <1.0)

## 2020-02-13 MED ORDER — MAGNESIUM CHLORIDE 64 MG PO TBEC
2.0000 | DELAYED_RELEASE_TABLET | Freq: Two times a day (BID) | ORAL | Status: DC
  Administered 2020-02-13 – 2020-02-14 (×2): 128 mg via ORAL
  Filled 2020-02-13 (×2): qty 2

## 2020-02-13 MED ORDER — MAGNESIUM SULFATE 4 GM/100ML IV SOLN
4.0000 g | Freq: Once | INTRAVENOUS | Status: DC
  Filled 2020-02-13: qty 100

## 2020-02-13 NOTE — Progress Notes (Signed)
IV team placed another IV. Patient immediately removed it. Notified MD of this d/t IV Mag ordered. No new orders @ this time. Unable to administer Mag d/t this.

## 2020-02-13 NOTE — TOC Progression Note (Signed)
Transition of Care [facility]) - Progression Note    Patient Details  Name: Monique Cabrera MRN: [id number] Date of Birth: 1935/04/07  Transition of Care [facility]) CM/SW [place], Student-Social Work Phone Number: 02/13/2020, 4:41 PM  Clinical Narrative:    Pt's son [name] called MSW back. Michela Pitcher that him and his family chose [employer] for [employer]. MSW/CSWWill continue with discharge plans.   Expected Discharge Plan: [place] Barriers to Discharge: Continued Medical Work up  Expected Discharge Plan and Services Expected Discharge Plan: [place] In-house Referral: Clinical Social Work Discharge Planning Services: CM Consult Post Acute Care Choice: [place] arrangements for the past 2 months: Single Family Home                                       Social Determinants of Health (SDOH) Interventions    Readmission Risk Interventions Readmission Risk Prevention Plan 11/02/2019  Transportation Screening Complete  PCP or Specialist Appt within 3-5 Days Complete  HRI or [place] Complete  Social Work Consult for [place] Planning/Counseling Complete  Palliative Care Screening Not Applicable  Medication Review Press photographer) Referral to Pharmacy  Some recent data might be hidden

## 2020-02-13 NOTE — NC FL2 (Signed)
[place] LEVEL OF CARE SCREENING TOOL     IDENTIFICATION  Patient Name: Monique Cabrera Birthdate: 1935-12-04 Sex: female Admission Date (Current Location): 02/08/2020  [facility] and [place] Number:  Herbalist and Address:  [facility], [place] [address], [place], [place] [address]      Provider Number: [id number]  Attending Physician Name and Address:  Jonetta Osgood, MD  Relative Name and Phone Number:  Domingo Dimes, [phone]    Current Level of Care: Hospital Recommended Level of Care: [place] Prior Approval Number:    Date Approved/Denied:   PASRR Number: [id number]  Discharge Plan:      Current Diagnoses: Patient Active Problem List   Diagnosis Date Noted  . Acute metabolic encephalopathy 35/36/1443  . COVID-19 virus infection 02/08/2020  . Acute kidney injury superimposed on CKD ([place]) 02/08/2020  . Status post below-knee amputation of right lower extremity ([place]) 11/07/2019  . Gangrene of right foot ([place])   . Hyponatremia 10/29/2019  . Chronic diastolic CHF (congestive heart failure) ([place]) 10/29/2019  . Stable proliferative diabetic retinopathy of both eyes associated with type 2 diabetes mellitus ([place]) 06/05/2018  . Acute CVA (cerebrovascular accident) ([place]) 02/18/2017  . Acute ischemic stroke ([place])   . Internuclear ophthalmoplegia of left eye   . Benign paroxysmal positional vertigo   . Abnormality of gait   . Acute onset of severe vertigo 02/17/2017  . Vertigo 02/17/2017  . Atherosclerosis of native artery of extremity with intermittent claudication ([place]) 03/11/2014  . Diabetic retinopathy ([place]) 03/11/2014  . Retinal hemorrhage due to secondary diabetes ([place]) 03/11/2014  . Type II diabetes mellitus with renal manifestations, uncontrolled ([place]) 03/11/2014  . Chronic hepatitis C without hepatic coma ([place]) 03/11/2014  . Hyperlipidemia 03/11/2014  . Hypoglycemia 04/16/2013  . Disorder of  bone and cartilage, unspecified   . Other and unspecified hyperlipidemia   . Essential hypertension, benign   . Atherosclerosis of native artery of extremity ([place])   . Chronic kidney disease (CKD), stage II (mild)   . Peripheral arterial disease ([place])   . Anemia   . Postherpetic neuralgia   . Hypertension     Orientation RESPIRATION BLADDER Height & Weight     Self, Place  Normal External catheter, Incontinent Weight: 115 lb 4.8 oz (52.3 kg) Height:  5\' 5"  (165.1 cm)  BEHAVIORAL SYMPTOMS/MOOD NEUROLOGICAL BOWEL NUTRITION STATUS  Other (Comment)(none)   Continent Diet(see dc summary)  AMBULATORY STATUS COMMUNICATION OF NEEDS Skin   Extensive Assist Verbally (Intact, dry)                       Personal Care Assistance Level of Assistance  Bathing, Feeding, Dressing, Total care Bathing Assistance: Maximum assistance Feeding assistance: Independent Dressing Assistance: Maximum assistance Total Care Assistance: Maximum assistance   Functional Limitations Info  Sight, Hearing, Speech Sight Info: Adequate Hearing Info: Adequate Speech Info: Adequate    SPECIAL CARE FACTORS FREQUENCY  PT (By licensed PT), OT (By licensed OT), Speech therapy     PT Frequency: 5x OT Frequency: 5x     Speech Therapy Frequency: 2x      Contractures Contractures Info: Not present    Additional Factors Info  Code Status, Allergies, Isolation Precautions, Insulin Sliding Scale Code Status Info: DNR Allergies Info: Invokana (canagliflozin) and Jardiance (empagliflozin)   Insulin Sliding Scale Info: SEE DC SUMMARY Isolation Precautions Info: COVID +     Current Medications (02/13/2020):  This  is the current hospital active medication list Current Facility-Administered Medications  Medication Dose Route Frequency Provider Last Rate Last Admin  . acetaminophen (TYLENOL) tablet 650 mg  650 mg Oral Q4H PRN Karmen Bongo, MD       Or  . acetaminophen (TYLENOL) 160 MG/5ML solution 650 mg   650 mg Per Tube Q4H PRN Karmen Bongo, MD       Or  . acetaminophen (TYLENOL) suppository 650 mg  650 mg Rectal Q4H PRN Karmen Bongo, MD      . amLODipine (NORVASC) tablet 10 mg  10 mg Oral Daily Elgergawy, Silver Huguenin, MD   10 mg at 02/13/20 0909  . aspirin EC tablet 81 mg  81 mg Oral Daily Rosalin Hawking, MD   81 mg at 02/13/20 0909  . atorvastatin (LIPITOR) tablet 20 mg  20 mg Oral q1800 Rosalin Hawking, MD   20 mg at 02/12/20 1715  . carvedilol (COREG) tablet 25 mg  25 mg Oral BID WC Elgergawy, Silver Huguenin, MD   25 mg at 02/13/20 0909  . clopidogrel (PLAVIX) tablet 75 mg  75 mg Oral Daily Karmen Bongo, MD   75 mg at 02/13/20 0909  . dextrose 5 % and 0.9 % NaCl with KCl 40 mEq/L infusion   Intravenous Continuous Elgergawy, Silver Huguenin, MD 75 mL/hr at 02/12/20 1320 Rate Change at 02/12/20 1320  . dextrose 50 % solution 0-50 mL  0-50 mL Intravenous PRN Elgergawy, Silver Huguenin, MD   30 mL at 02/10/20 0057  . enoxaparin (LOVENOX) injection 30 mg  30 mg Subcutaneous Q24H Karmen Bongo, MD   30 mg at 02/12/20 1325  . feeding supplement (ENSURE ENLIVE) (ENSURE ENLIVE) liquid 237 mL  237 mL Oral TID BM Elgergawy, Silver Huguenin, MD   237 mL at 02/13/20 0900  . feeding supplement (GLUCERNA SHAKE) (GLUCERNA SHAKE) liquid 237 mL  237 mL Oral TID BM Elgergawy, Silver Huguenin, MD   237 mL at 02/13/20 0930  . hydrALAZINE (APRESOLINE) injection 5 mg  5 mg Intravenous Q6H PRN Elgergawy, Silver Huguenin, MD   5 mg at 02/10/20 1644  . insulin aspart (novoLOG) injection 0-9 Units  0-9 Units Subcutaneous TID WC Kirby-Graham, Karsten Fells, NP   3 Units at 02/13/20 1156  . levETIRAcetam (KEPPRA) tablet 250 mg  250 mg Oral BID Phillips Odor, MD   250 mg at 02/13/20 0909  . ondansetron (ZOFRAN) injection 4 mg  4 mg Intravenous Q6H PRN Karmen Bongo, MD      . phosphorus (K PHOS NEUTRAL) tablet 500 mg  500 mg Oral TID Elgergawy, Silver Huguenin, MD   500 mg at 02/13/20 0909  . senna-docusate (Senokot-S) tablet 1 tablet  1 tablet Oral QHS PRN Karmen Bongo, MD      . sodium chloride flush (NS) 0.9 % injection 3 mL  3 mL Intravenous Q12H Karmen Bongo, MD   3 mL at 02/12/20 2251     Discharge Medications: Please see discharge summary for a list of discharge medications.  Relevant Imaging Results:  Relevant Lab Results:   Additional Information SSN: [id number]  Ralph Dowdy, Student-Social Work

## 2020-02-13 NOTE — Progress Notes (Signed)
  Speech Language Pathology Treatment: Cognitive-Linquistic;Dysphagia  Patient Details Name: Monique Cabrera MRN: [id number] DOB: Jul 10, 1935 Today's Date: 02/13/2020 Time: 1200-1210 SLP Time Calculation (min) (ACUTE ONLY): 10 min  Assessment / Plan / Recommendation Clinical Impression  Pts tolerance of dys 2 solids and thin liquids excellent, was able to self feed meals. She reports she typically uses dentures and is happy with soft foods at this time. Cognition improving, but intellectual and emergent awareness still impaired. Pt starting demonstrate increased short term memory so there is potential for these skills to improve. However, pt will need at least 24 hour supervision at d/c and would continue to benefit from rehabilitation. Will sign off for swallowing, but continue to target cognitive goals.   HPI HPI: 85 yo female adm to [facility] with AMS, differential is COVID encephalopathy, CVA or seizure.  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 Pt PMH + for CVAs- one 02/18/17, DM2, Diabetic retinopathy, PAD, chronic hep B, herpetic neuralgia, gait abnormality, left gangrene s/p BKA in 10/2019.  MRI ordered for when able to conduct MRI w/ + COVID.  CT head negative for acute event, stable atrophy.      SLP Plan  Continue with current plan of care       Recommendations  Diet recommendations: Dysphagia 2 (fine chop);Thin liquid                Follow up Recommendations: Inpatient Rehab SLP Visit Diagnosis: Cognitive communication deficit (X41.287) Plan: Continue with current plan of care       GO               Herbie [place], MA [place] Pager [phone] Office [phone]   Lynann Beaver 02/13/2020, 2:50 PM

## 2020-02-13 NOTE — TOC Progression Note (Signed)
Transition of Care [facility]) - Progression Note    Patient Details  Name: Monique Cabrera MRN: [id number] Date of Birth: 06/14/1935  Transition of Care [facility]) CM/SW [place], Student-Social Work Phone Number: 02/13/2020, 4:22 PM  Clinical Narrative:    Pt's son [name], called MSW back at 4:22 PM. [name] wanted to know the rehab places that has accepted patient. MSW told [name] that [facility], and [facility] has accepted pt. [name] said that he wanted to call these facilities and see which was best for patient. MSW and CSW will continue to follow up.    Expected Discharge Plan: [place] Barriers to Discharge: Continued Medical Work up  Expected Discharge Plan and Services Expected Discharge Plan: [place] In-house Referral: Clinical Social Work Discharge Planning Services: CM Consult Post Acute Care Choice: [place] arrangements for the past 2 months: Single Family Home                                       Social Determinants of Health (SDOH) Interventions    Readmission Risk Interventions Readmission Risk Prevention Plan 11/02/2019  Transportation Screening Complete  PCP or Specialist Appt within 3-5 Days Complete  HRI or [place] Complete  Social Work Consult for [place] Planning/Counseling Complete  Palliative Care Screening Not Applicable  Medication Review Press photographer) Referral to Pharmacy  Some recent data might be hidden

## 2020-02-13 NOTE — TOC Progression Note (Signed)
Transition of Care [facility]) - Progression Note    Patient Details  Name: Monique Cabrera MRN: [id number] Date of Birth: Mar 03, 1935  Transition of Care [name]) CM/SW [place], [place] Work Phone Number: 02/13/2020, 11:02 AM  Clinical Narrative:    MSW received consult for possible SNF placement at time of discharge for pt. MSW spoke with pt's son [phone]) , [name] regarding PT recommendation of SNF placement at time of discharge for Monique Cabrera (pt), since she is confused and disorientated at this time. [name] reported need for rehab regarding his mom, however, [name] wanted to inform his other brothers and sisters of mom's need for rehab. [name] said that he would contact MSW/CSW after talking things over with his family. MSW/CSW will continue to follow up with discharge plans. Will fax out referrals to rehab facilities.        Expected Discharge Plan and Services                                                 Social Determinants of Health (SDOH) Interventions    Readmission Risk Interventions Readmission Risk Prevention Plan 11/02/2019  Transportation Screening Complete  PCP or Specialist Appt within 3-5 Days Complete  HRI or [place] Complete  Social Work Consult for [place] Planning/Counseling Complete  Palliative Care Screening Not Applicable  Medication Review Press photographer) Referral to Pharmacy  Some recent data might be hidden

## 2020-02-13 NOTE — Progress Notes (Signed)
Patient cnts to pull and remove telemetry - MD stated if patient will not leave this on then can d/c this. Also, patient has no PIV and MD aware. No order to place another @ this time d/t patient cnts to pull these out as well and MD stated patient does not need anything IV.

## 2020-02-13 NOTE — Progress Notes (Signed)
PROGRESS NOTE                                                                                                                                                                                                             Patient Demographics:    Monique Cabrera, is a 85 y.o. female, DOB - May 13, 1935, JME:[id number]  Outpatient Primary MD for the patient is [name], DO   Admit date - 02/08/2020   LOS - 5  Chief Complaint  Patient presents with  . Code Stroke       Brief Narrative: Patient is a 85 y.o. 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   Subjective:    Monique Cabrera today was lying comfortably in bed-slightly confused-but was able to answer most of my questions appropriately.  Per nursing staff-she refuses to keep telemetry leads on.   Assessment  & Plan :   Acute metabolic encephalopathy with Todd's paresis likely secondary to new onset seizures: No further right-sided weakness-MRI brain negative for CVA-CTA neck with no high-grade stenosis.  EEG without any seizures.  Evaluated by neurology-thought to have Todd's paresis and right-sided weakness-recommendations are to continue with Keppra.  Will need outpatient neurology follow-up.  COVID-19 infection: Completed a course of remdesivir-she is on room air this morning.  Fever: afebrile  O2 requirements:  SpO2: 100 %   COVID-19 Labs: Recent Labs    02/11/20 0657 02/12/20 0332 02/13/20 0504  DDIMER 2.65* 3.29* 3.60*  FERRITIN 819* 759*  --   CRP 1.3* 0.8 0.6    No results found for: BNP  Recent Labs  Lab 02/08/20 1530  PROCALCITON 1.54    Lab Results  Component Value Date   SARSCOV2NAA POSITIVE (A) 02/08/2020   [place] NEGATIVE 10/29/2019     COVID-19  Medications: Remdesivir: 2/12>> 2/16  Prone/Incentive Spirometry: encouraged  incentive spirometry use 3-4/hour.  DVT Prophylaxis  :  Lovenox    HTN: C controlled-ontinue Norvasc/Coreg.  Diovan/HCTZ on hold.  HLD: Continue statin  DM with mild DKA on admission (A1c 11.0 on 02/09/2020): Appears to have brittle diabetes-previously on Lantus but was stopped due to hypoglycemic episodes-CBGs relatively stable on SSI.  Allow permissive hyperglycemia given brittle  diabetes.  CBG (last 3)  Recent Labs    02/13/20 0752 02/13/20 0808 02/13/20 1127  GLUCAP 152* 140* 227*    PAD:s/p right BKA-on aspirin/Plavix/statin  AKI on Stage 3a TML:YYTKPT hemodynamically mediated AKI-improved-creatinine close to baseline.  Volume status is stable.  No need for IV fluids.    Hypomagnesemia: Replete and recheck  Nutrition Problem: Nutrition Problem: Inadequate oral intake Etiology: inability to eat, lethargy/confusion Signs/Symptoms: NPO status Interventions: Refer to RD note for recommendations  Consults  : Neurology  Procedures  :  None  ABG:    Component Value Date/Time   TCO2 25 02/08/2020 0953    Vent Settings: N/A  Condition - Stable  Family Communication  :  Daughter updated over the phone  Code Status :  DNR  Diet :  Diet Order            DIET DYS 2 Room service appropriate? No; Fluid consistency: Thin  Diet effective now               Disposition Plan  :  Remain hospitalized-SNF on discharge when bed available  Barriers to discharge: Awaiting SNF bed  Antimicorbials  :    Anti-infectives (From admission, onward)   Start     Dose/Rate Route Frequency Ordered Stop   02/09/20 1000  remdesivir 100 mg in sodium chloride 0.9 % 100 mL IVPB     100 mg 200 mL/hr over 30 Minutes Intravenous Daily 02/08/20 1924 02/12/20 1012   02/08/20 2000  remdesivir 200 mg in sodium chloride 0.9% 250 mL IVPB     200 mg 580 mL/hr over 30 Minutes Intravenous Once 02/08/20 1924  02/08/20 2312      Inpatient Medications  Scheduled Meds: . amLODipine  10 mg Oral Daily  . aspirin EC  81 mg Oral Daily  . atorvastatin  20 mg Oral q1800  . carvedilol  25 mg Oral BID WC  . clopidogrel  75 mg Oral Daily  . enoxaparin (LOVENOX) injection  30 mg Subcutaneous Q24H  . feeding supplement (ENSURE ENLIVE)  237 mL Oral TID BM  . feeding supplement (GLUCERNA SHAKE)  237 mL Oral TID BM  . insulin aspart  0-9 Units Subcutaneous TID WC  . levETIRAcetam  250 mg Oral BID  . phosphorus  500 mg Oral TID  . sodium chloride flush  3 mL Intravenous Q12H   Continuous Infusions: . dextrose 5 % and 0.9 % NaCl with KCl 40 mEq/L 75 mL/hr at 02/12/20 1320   PRN Meds:.acetaminophen **OR** acetaminophen (TYLENOL) oral liquid 160 mg/5 mL **OR** acetaminophen, dextrose, hydrALAZINE, ondansetron (ZOFRAN) IV, senna-docusate   Time Spent in minutes  25  See all Orders from today for further details   Oren Binet M.D on 02/13/2020 at 2:32 PM  To page go to [email or web address] - use universal password  [facility] -  Office  [phone]    Objective:   Vitals:   02/13/20 0408 02/13/20 0411 02/13/20 0808 02/13/20 1240  BP: (!) 135/58  (!) 126/52 (!) 105/58  Pulse:   69 67  Resp: (!) 21 12 20 15   Temp: 97.8 F (36.6 C)  98.2 F (36.8 C) 97.6 F (36.4 C)  TempSrc: Axillary  Oral Oral  SpO2:   97% 100%  Weight:      Height:        Wt Readings from Last 3 Encounters:  02/12/20 52.3 kg  01/24/20 60.3 kg  12/04/19 60.3 kg     Intake/Output Summary (Last  24 hours) at 02/13/2020 1432 Last data filed at 02/13/2020 1308 Gross per 24 hour  Intake 490 ml  Output --  Net 490 ml     Physical Exam Gen Exam:Alert awake-not in any distress HEENT:atraumatic, normocephalic Chest: B/L clear to auscultation anteriorly CVS:S1S2 regular Abdomen:soft non tender, non distended Extremities: Right BKA Neurology: Non focal Skin: no rash   Data Review:    CBC Recent Labs   Lab 02/09/20 1242 02/10/20 0435 02/11/20 0657 02/12/20 0332 02/13/20 0504  WBC 14.3* 11.3* 6.9 6.2 6.2  HGB 12.2 10.9* 11.1* 11.1* 10.1*  HCT 35.0* 31.9* 32.2* 32.0* 30.0*  PLT 282 232 228 233 220  MCV 88.4 91.1 90.4 89.9 90.9  MCH 30.8 31.1 31.2 31.2 30.6  MCHC 34.9 34.2 34.5 34.7 33.7  RDW 12.5 12.8 12.6 12.4 12.3  LYMPHSABS 1.5 1.9 1.5 1.5 1.6  MONOABS 1.1* 0.9 0.5 0.6 0.6  EOSABS 0.0 0.0 0.5 0.5 0.5  BASOSABS 0.0 0.0 0.0 0.0 0.0    Chemistries  Recent Labs  Lab 02/09/20 1242 02/09/20 1242 02/10/20 0435 02/10/20 1805 02/11/20 0657 02/12/20 0332 02/13/20 0504  NA 140   < > 144 140 140 137 138  K 3.3*   < > 3.0* 3.5 3.7 3.3* 4.1  CL 101   < > 111 109 112* 107 110  CO2 24   < > 23 19* 20* 21* 21*  GLUCOSE 223*   < > 99 61* 103* 102* 158*  BUN 31*   < > 30* 28* 19 19 17   CREATININE 1.80*   < > 1.90* 1.57* 1.40* 1.60* 1.59*  CALCIUM 10.8*   < > 10.5* 9.8 9.6 9.5 9.2  MG  --   --   --   --  1.2* 1.2* 1.4*  AST 18  --  17  --  21 20 20   ALT 13  --  11  --  14 15 14   ALKPHOS 62  --  60  --  54 54 54  BILITOT 0.5  --  0.3  --  1.0 0.9 0.7   < > = values in this interval not displayed.   ------------------------------------------------------------------------------------------------------------------ No results for input(s): CHOL, HDL, LDLCALC, TRIG, CHOLHDL, LDLDIRECT in the last 72 hours.  Lab Results  Component Value Date   HGBA1C 11.0 (H) 02/09/2020   ------------------------------------------------------------------------------------------------------------------ No results for input(s): TSH, T4TOTAL, T3FREE, THYROIDAB in the last 72 hours.  Invalid input(s): FREET3 ------------------------------------------------------------------------------------------------------------------ Recent Labs    02/11/20 0657 02/12/20 0332  FERRITIN 819* 759*    Coagulation profile Recent Labs  Lab 02/08/20 0950  INR 1.0    Recent Labs    02/12/20 0332  02/13/20 0504  DDIMER 3.29* 3.60*    Cardiac Enzymes No results for input(s): CKMB, TROPONINI, MYOGLOBIN in the last 168 hours.  Invalid input(s): CK ------------------------------------------------------------------------------------------------------------------ No results found for: BNP  Micro Results Recent Results (from the past 240 hour(s))  Respiratory Panel by RT PCR (Flu A&B, Covid) - Nasopharyngeal Swab     Status: Abnormal   Collection Time: 02/08/20  9:45 AM   Specimen: Nasopharyngeal Swab  Result Value Ref Range Status   SARS Coronavirus 2 by RT PCR POSITIVE (A) NEGATIVE Final    Comment: RESULT CALLED TO, READ BACK BY AND VERIFIED WITH: A. Florene Glen RN 11:50 02/08/20 ([email or web address]) (NOTE) SARS-CoV-2 target nucleic acids are DETECTED. SARS-CoV-2 RNA is generally detectable in upper respiratory specimens  during the acute phase of infection. Positive results are indicative of the presence of  the identified virus, but do not rule out bacterial infection or co-infection with other pathogens not detected by the test. Clinical correlation with patient history and other diagnostic information is necessary to determine patient infection status. The expected result is Negative. Fact Sheet for Patients:  PinkCheek.be Fact Sheet for Healthcare Providers: GravelBags.it This test is not yet approved or cleared by the [place] FDA and  has been authorized for detection and/or diagnosis of SARS-CoV-2 by FDA under an Emergency Use Authorization (EUA).  This EUA will remain in effect (meaning this test can be used)  for the duration of  the COVID-19 declaration under Section 564(b)(1) of the Act, 21 U.S.C. section 360bbb-3(b)(1), unless the authorization is terminated or revoked sooner.    Influenza A by PCR NEGATIVE NEGATIVE Final   Influenza B by PCR NEGATIVE NEGATIVE Final    Comment: (NOTE) The Xpert Xpress  SARS-CoV-2/FLU/RSV assay is intended as an aid in  the diagnosis of influenza from Nasopharyngeal swab specimens and  should not be used as a sole basis for treatment. Nasal washings and  aspirates are unacceptable for Xpert Xpress SARS-CoV-2/FLU/RSV  testing. Fact Sheet for Patients: PinkCheek.be Fact Sheet for Healthcare Providers: GravelBags.it This test is not yet approved or cleared by the [place] FDA and  has been authorized for detection and/or diagnosis of SARS-CoV-2 by  FDA under an Emergency Use Authorization (EUA). This EUA will remain  in effect (meaning this test can be used) for the duration of the  Covid-19 declaration under Section 564(b)(1) of the Act, 21  U.S.C. section 360bbb-3(b)(1), unless the authorization is  terminated or revoked. Performed at [facility], [place] [address], [place], [place] [address]     Radiology Reports CT ANGIO HEAD W OR WO CONTRAST  Addendum Date: 02/08/2020   ADDENDUM REPORT: 02/08/2020 14:54 ADDENDUM: A 2 mm left MCA bifurcation aneurysm is stable from the prior exam. This is noted on further inquiry for explanation of right-sided symptoms. This is unlikely to be related to the patient's symptoms. Electronically Signed   By: San Morelle M.D.   On: 02/08/2020 14:54   Result Date: 02/08/2020 CLINICAL DATA:  Right-sided weakness. EXAM: CT ANGIOGRAPHY HEAD AND NECK TECHNIQUE: Multidetector CT imaging of the head and neck was performed using the standard protocol during bolus administration of intravenous contrast. Multiplanar CT image reconstructions and MIPs were obtained to evaluate the vascular anatomy. Carotid stenosis measurements (when applicable) are obtained utilizing NASCET criteria, using the distal internal carotid diameter as the denominator. CONTRAST:  30mL OMNIPAQUE IOHEXOL 350 MG/ML SOLN COMPARISON:  None. FINDINGS: CTA NECK FINDINGS Aortic arch:  Atherosclerotic calcifications are present within the aortic arch and at the origins the great vessels without significant stenosis or change. There is no aneurysm. Right carotid system: Right common carotid artery is within normal limits. Bifurcation is somewhat obscured by patient motion. No significant stenosis is present. Mild tortuosity is present in the cervical right ICA without significant stenosis. Left carotid system: Left common carotid artery is tortuous. Atherosclerotic changes are present at the bifurcation without significant stenosis. There is mild tortuosity of the cervical left ICA without significant stenosis. Vertebral arteries: The left vertebral artery is the dominant vessel. Both vertebral arteries originate from the subclavian arteries without significant stenosis. There is no definite stenosis in the neck. Skeleton: Degenerative changes are present lower cervical spine. Osseous structures are somewhat distorted by patient motion. No acute abnormalities are present. Other neck: The soft tissues the neck are otherwise  unremarkable. Upper chest: Centrilobular emphysematous changes are present. No nodule or mass lesion is present. Thoracic inlet is normal. Review of the MIP images confirms the above findings CTA HEAD FINDINGS Anterior circulation: Atherosclerotic changes are present within the cavernous internal carotid arteries bilaterally without significant stenosis. Mild narrowing is present in the proximal right A1 segment. No emergent large vessel occlusion is present. There is some attenuation of distal small vessels. Posterior circulation: The left vertebral artery is the dominant vessel. No significant stenosis is present. There is mild narrowing the mid basilar artery. Both posterior cerebral arteries originate from basilar tip. Distal vessel attenuation is present without a significant proximal stenosis or occlusion. Venous sinuses: The dural sinuses patent. The straight sinus and  deep cerebral veins are intact. Cortical veins are distorted by patient motion. Anatomic variants: None Review of the MIP images confirms the above findings IMPRESSION: 1. No emergent large vessel occlusion. 2. Atherosclerotic changes at the aortic arch, left carotid bifurcation, and cavernous internal carotid arteries bilaterally without significant stenosis. 3. Mild narrowing of the proximal right A1 segment. 4. Distal small vessel attenuation without a significant proximal stenosis, aneurysm, or branch vessel occlusion within the Circle of Willis. These results were called by telephone at the time of interpretation on 02/08/2020 at 10:05am to provider Dr. Lorraine Lax, Who verbally acknowledged these results. Electronically Signed: By: San Morelle M.D. On: 02/08/2020 10:34   CT Angio Neck W and/or Wo Contrast  Addendum Date: 02/08/2020   ADDENDUM REPORT: 02/08/2020 14:54 ADDENDUM: A 2 mm left MCA bifurcation aneurysm is stable from the prior exam. This is noted on further inquiry for explanation of right-sided symptoms. This is unlikely to be related to the patient's symptoms. Electronically Signed   By: San Morelle M.D.   On: 02/08/2020 14:54   Result Date: 02/08/2020 CLINICAL DATA:  Right-sided weakness. EXAM: CT ANGIOGRAPHY HEAD AND NECK TECHNIQUE: Multidetector CT imaging of the head and neck was performed using the standard protocol during bolus administration of intravenous contrast. Multiplanar CT image reconstructions and MIPs were obtained to evaluate the vascular anatomy. Carotid stenosis measurements (when applicable) are obtained utilizing NASCET criteria, using the distal internal carotid diameter as the denominator. CONTRAST:  58mL OMNIPAQUE IOHEXOL 350 MG/ML SOLN COMPARISON:  None. FINDINGS: CTA NECK FINDINGS Aortic arch: Atherosclerotic calcifications are present within the aortic arch and at the origins the great vessels without significant stenosis or change. There is no  aneurysm. Right carotid system: Right common carotid artery is within normal limits. Bifurcation is somewhat obscured by patient motion. No significant stenosis is present. Mild tortuosity is present in the cervical right ICA without significant stenosis. Left carotid system: Left common carotid artery is tortuous. Atherosclerotic changes are present at the bifurcation without significant stenosis. There is mild tortuosity of the cervical left ICA without significant stenosis. Vertebral arteries: The left vertebral artery is the dominant vessel. Both vertebral arteries originate from the subclavian arteries without significant stenosis. There is no definite stenosis in the neck. Skeleton: Degenerative changes are present lower cervical spine. Osseous structures are somewhat distorted by patient motion. No acute abnormalities are present. Other neck: The soft tissues the neck are otherwise unremarkable. Upper chest: Centrilobular emphysematous changes are present. No nodule or mass lesion is present. Thoracic inlet is normal. Review of the MIP images confirms the above findings CTA HEAD FINDINGS Anterior circulation: Atherosclerotic changes are present within the cavernous internal carotid arteries bilaterally without significant stenosis. Mild narrowing is present in the proximal right A1 segment.  No emergent large vessel occlusion is present. There is some attenuation of distal small vessels. Posterior circulation: The left vertebral artery is the dominant vessel. No significant stenosis is present. There is mild narrowing the mid basilar artery. Both posterior cerebral arteries originate from basilar tip. Distal vessel attenuation is present without a significant proximal stenosis or occlusion. Venous sinuses: The dural sinuses patent. The straight sinus and deep cerebral veins are intact. Cortical veins are distorted by patient motion. Anatomic variants: None Review of the MIP images confirms the above findings  IMPRESSION: 1. No emergent large vessel occlusion. 2. Atherosclerotic changes at the aortic arch, left carotid bifurcation, and cavernous internal carotid arteries bilaterally without significant stenosis. 3. Mild narrowing of the proximal right A1 segment. 4. Distal small vessel attenuation without a significant proximal stenosis, aneurysm, or branch vessel occlusion within the Circle of Willis. These results were called by telephone at the time of interpretation on 02/08/2020 at 10:05am to provider Dr. Lorraine Lax, Who verbally acknowledged these results. Electronically Signed: By: San Morelle M.D. On: 02/08/2020 10:34   MR BRAIN WO CONTRAST  Result Date: 02/10/2020 CLINICAL DATA:  Acute presentation with worsened level of consciousness and speech disturbance. EXAM: MRI HEAD WITHOUT CONTRAST TECHNIQUE: Multiplanar, multiecho pulse sequences of the brain and surrounding structures were obtained without intravenous contrast. COMPARISON:  CT studies 02/08/2020 FINDINGS: Brain: Diffusion imaging does not show any acute or subacute infarction. Chronic small-vessel ischemic changes affect the pons. No focal cerebellar insult. Cerebral hemispheres show generalized atrophy with mild chronic small-vessel change of the white matter considering age. No cortical or large vessel territory infarction. No mass lesion, hemorrhage, hydrocephalus or extra-axial collection. Vascular: Major vessels at the base of the brain show flow. Skull and upper cervical spine: Negative Sinuses/Orbits: Clear/normal Other: None IMPRESSION: No acute finding. Age related atrophy. Chronic small-vessel ischemic changes of the pons and cerebral hemispheric white matter, fairly mild considering age. Electronically Signed   By: Nelson Chimes M.D.   On: 02/10/2020 03:55   DG CHEST PORT 1 VIEW  Result Date: 02/08/2020 CLINICAL DATA:  COVID-19 positive, altered mental status EXAM: PORTABLE CHEST 1 VIEW COMPARISON:  Radiograph 10/04/2019 FINDINGS:  Coarse interstitial changes are similar to priors. No consolidation, features of edema, pneumothorax, or effusion. Pulmonary vascularity is normally distributed. The aorta is calcified. The remaining cardiomediastinal contours are unremarkable. No acute osseous or soft tissue abnormality. No acute osseous or soft tissue abnormality. IMPRESSION: No acute cardiopulmonary findings. Coarse interstitial changes are similar to priors. Aortic Atherosclerosis (ICD10-I70.0). Electronically Signed   By: Lovena Le M.D.   On: 02/08/2020 19:47   DG Abd Portable 1V  Result Date: 02/09/2020 CLINICAL DATA:  Metal screening prior to MRI. EXAM: PORTABLE ABDOMEN - 1 VIEW COMPARISON:  CT abdomen and pelvis 08/21/2012 FINDINGS: No radiopaque foreign body is identified in the abdomen or pelvis with incomplete imaging of the inferior pelvic ring. No dilated loops of bowel are seen to suggest obstruction. Atherosclerotic vascular calcifications are noted. The visualized lung bases are grossly clear. IMPRESSION: No metallic foreign body identified. Electronically Signed   By: Logan Bores M.D.   On: 02/09/2020 10:04   EEG adult  Result Date: 02/08/2020 Lora Havens, MD     02/08/2020  1:32 PM Patient Name: MAKALYN LENNOX MRN: [id number] Epilepsy Attending: Lora Havens Referring Physician/Provider: Laurey Morale, NP Date: 02/08/2020 Duration: 36.20 minutes Patient history: 85 year old female with prior medical history of stroke who presented with unresponsiveness and right-sided weakness.  CT  head negative for hemorrhage.  EEG evaluate for seizures. Level of alertness: Lethargic AEDs during EEG study: None Technical aspects: This EEG study was done with scalp electrodes positioned according to the 10-20 International system of electrode placement. Electrical activity was acquired at a sampling rate of 500Hz  and reviewed with a high frequency filter of 70Hz  and a low frequency filter of 1Hz . EEG data were recorded  continuously and digitally stored. Description: EEG showed continuous generalized low amplitude 2 to 3 Hz delta slowing as well as 13 to 15 Hz frontocentral beta activity.  Hyperventilation and photic stimulation were not performed. Of note in the beginning, study was technically difficult due to significant myogenic artifact. Abnormality -Continuous slow, generalized IMPRESSION: This study is suggestive of moderate to severe diffuse encephalopathy, nonspecific etiology. No seizures or epileptiform discharges were seen throughout the recording. Lora Havens   CT HEAD CODE STROKE WO CONTRAST  Result Date: 02/08/2020 CLINICAL DATA:  Code stroke. Neuro deficit, acute, stroke suspected. EXAM: CT HEAD WITHOUT CONTRAST TECHNIQUE: Contiguous axial images were obtained from the base of the skull through the vertex without intravenous contrast. COMPARISON:  None. FINDINGS: Brain: No acute infarct, hemorrhage, or mass lesion is present. Moderate generalized atrophy and white matter disease is stable. The ventricles are proportionate to the degree of atrophy. Basal ganglia and insular cortex is bilaterally. No acute or focal cortical abnormality is present. No significant extraaxial fluid collection is present. The brainstem and cerebellum are within normal limits. Vascular: Extensive vascular calcifications extend to the MCA bifurcations bilaterally without significant interval change. No other asymmetric hyperdense vessel is present. Dense calcifications in the basilar artery vertebral arteries are also stable. Skull: Insert normal skull No significant extracranial soft tissue lesion is present. Sinuses/Orbits: The paranasal sinuses and mastoid air cells are clear. Bilateral lens replacements are noted. Globes and orbits are otherwise unremarkable. Other: ASPECTS (S[place]ney Stroke Program Early CT Score) - Ganglionic level infarction (caudate, lentiform nuclei, internal capsule, insula, M1-M3 cortex): 3/3 -  Supraganglionic infarction (M4-M6 cortex): 7/7 Total score (0-10 with 10 being normal): 10/10 IMPRESSION: 1. No acute intracranial abnormality or significant interval change. 2. Stable advanced atrophy and diffuse white matter disease. 3. Advanced calcific atherosclerotic changes extend to the MCA bifurcations and the basilar artery are stable. 4. ASPECTS is 10/10 1. The above was relayed via text pager to Dr. Lorraine Lax on 02/08/2020 at 10:03 . Electronically Signed   By: San Morelle M.D.   On: 02/08/2020 10:04   VAS [place] LOWER EXTREMITY VENOUS (DVT)  Result Date: 02/11/2020  Lower Venous DVTStudy Indications: Elevated D-Dimer, Covid+.  Risk Factors: Surgery Right below knee amputation. Limitations: Dementia, uncooperative. Comparison Study: No prior exams. Performing Technologist: Baldwin Crown RDMS, RVT  Examination Guidelines: A complete evaluation includes B-mode imaging, spectral Doppler, color Doppler, and power Doppler as needed of all accessible portions of each vessel. Bilateral testing is considered an integral part of a complete examination. Limited examinations for reoccurring indications may be performed as noted. The reflux portion of the exam is performed with the patient in reverse Trendelenburg.  +---------+---------------+---------+-----------+----------+--------------+ RIGHT    CompressibilityPhasicitySpontaneityPropertiesThrombus Aging +---------+---------------+---------+-----------+----------+--------------+ CFV      Full           Yes      Yes                                 +---------+---------------+---------+-----------+----------+--------------+ SFJ      Full                                                        +---------+---------------+---------+-----------+----------+--------------+  FV Prox  Full                                                        +---------+---------------+---------+-----------+----------+--------------+ FV Mid   Full                                                         +---------+---------------+---------+-----------+----------+--------------+ FV DistalFull                                                        +---------+---------------+---------+-----------+----------+--------------+ PFV      Full                                                        +---------+---------------+---------+-----------+----------+--------------+ POP      Full           Yes      Yes                                 +---------+---------------+---------+-----------+----------+--------------+   +---------+---------------+---------+-----------+----------+--------------+ LEFT     CompressibilityPhasicitySpontaneityPropertiesThrombus Aging +---------+---------------+---------+-----------+----------+--------------+ CFV      Full           Yes      Yes                                 +---------+---------------+---------+-----------+----------+--------------+ SFJ      Full                                                        +---------+---------------+---------+-----------+----------+--------------+ FV Prox  Full                                                        +---------+---------------+---------+-----------+----------+--------------+ FV Mid   Full                                                        +---------+---------------+---------+-----------+----------+--------------+ FV DistalFull                                                        +---------+---------------+---------+-----------+----------+--------------+  PFV      Full                                                        +---------+---------------+---------+-----------+----------+--------------+ POP      Full           Yes      Yes                                 +---------+---------------+---------+-----------+----------+--------------+ PTV      Full                                                         +---------+---------------+---------+-----------+----------+--------------+ PERO     Full                                                        +---------+---------------+---------+-----------+----------+--------------+     Summary: BILATERAL: - No evidence of deep vein thrombosis seen in the lower extremities, bilaterally.  RIGHT: - No cystic structure found in the popliteal fossa. - Right below knee amputation.  LEFT: - No cystic structure found in the popliteal fossa.  *See table(s) above for measurements and observations. Electronically signed by Ruta Hinds MD on 02/11/2020 at 5:17:49 PM.    Final    ECHOCARDIOGRAM LIMITED  Result Date: 02/09/2020    ECHOCARDIOGRAM LIMITED REPORT   Patient Name:   JEARLEAN DEMAURO Date of Exam: 02/09/2020 Medical Rec #:  [id number]        Height:       65.0 in Accession #:    [id number]       Weight:       132.3 lb Date of Birth:  Sep 21, 1935        BSA:          1.66 m Patient Age:    38 years         BP:           209/93 mmHg Patient Gender: F                HR:           94 bpm. Exam Location:  Inpatient Procedure: Limited Echo and Color Doppler Indications:    CVA  History:        Patient has prior history of Echocardiogram examinations, most                 recent 02/18/2017. Stroke and PAD; Risk Factors:Hypertension,                 Diabetes and Dyslipidemia.  Sonographer:    Dustin Flock Referring Phys: [place] Comments: Image acquisition challenging due to uncooperative patient and cant follow commands. IMPRESSIONS  1. Left ventricular ejection fraction, by estimation, is 60 to 65%. The left ventricle has normal function. The left ventricle has no regional wall motion abnormalities. Left ventricular diastolic  function could not be evaluated.  2. Right ventricular systolic function is normal. The right ventricular size is normal.  3. The mitral valve is normal in structure and function. No evidence of mitral valve  regurgitation. No evidence of mitral stenosis.  4. The aortic valve is normal in structure and function. Aortic valve regurgitation is not visualized. Mild aortic valve sclerosis is present, with no evidence of aortic valve stenosis.  5. The inferior vena cava is normal in size with greater than 50% respiratory variability, suggesting right atrial pressure of 3 mmHg. Conclusion(s)/Recomendation(s): Limited study due to poor patient cooperation. FINDINGS  Left Ventricle: Left ventricular ejection fraction, by estimation, is 60 to 65%. The left ventricle has normal function. The left ventricle has no regional wall motion abnormalities. The left ventricular internal cavity size was normal in size. There is  no left ventricular hypertrophy. The left ventricular diastology could not be evaluated due to indeterminate diastolic function. Right Ventricle: The right ventricular size is normal. No increase in right ventricular wall thickness. Right ventricular systolic function is normal. Left Atrium: Left atrial size was normal in size. Right Atrium: Right atrial size was normal in size. Pericardium: There is no evidence of pericardial effusion. Mitral Valve: The mitral valve is normal in structure and function. Normal mobility of the mitral valve leaflets. No evidence of mitral valve stenosis. Tricuspid Valve: The tricuspid valve is normal in structure. Tricuspid valve regurgitation is not demonstrated. No evidence of tricuspid stenosis. Aortic Valve: The aortic valve is normal in structure and function. Aortic valve regurgitation is not visualized. Mild aortic valve sclerosis is present, with no evidence of aortic valve stenosis. Pulmonic Valve: The pulmonic valve was normal in structure. Pulmonic valve regurgitation is not visualized. No evidence of pulmonic stenosis. Aorta: The aortic root is normal in size and structure. Venous: The inferior vena cava is normal in size with greater than 50% respiratory variability,  suggesting right atrial pressure of 3 mmHg. IAS/Shunts: No atrial level shunt detected by color flow Doppler.  LEFT VENTRICLE PLAX 2D LVIDd:         3.27 cm Diastology LVIDs:         2.15 cm LV e' lateral: 6.31 cm/s LV PW:         1.07 cm LV e' medial:  3.48 cm/s LV IVS:        1.24 cm  Dani Gobble Croitoru MD Electronically signed by Sanda Klein MD Signature Date/Time: 02/09/2020/12:54:09 PM    Final

## 2020-02-13 NOTE — TOC Progression Note (Signed)
Transition of Care [facility]) - Progression Note    Patient Details  Name: Monique Cabrera MRN: [id number] Date of Birth: Dec 05, 1935  Transition of Care [facility] CM/SW [place], Student-Social Work Phone Number: 02/13/2020, 4:02 PM  Clinical Narrative:    MSW spoke with pt's son [name], [phone]) regarding pt's discharge plans. MSW made [name] aware that transition team is planning on discharging patient tomorrow. [name] was shocked by how fast the discharge was going to be, and voiced concerns about taking his mother into a facility where other residents could be COVID + as well. He does not want his mother to have a worse case of COVID. MSW said that this was the only option unless [name] and their family decided to take her home. [name] said that he liked this option better because of COVID and his mother being around others who may be COVID + in a rehab facility. MSW informed [name] that she would need 24 hour care, and asked if the pt lived by herself. [name] stated that the pt did live by herself, however, they would care for her if needed and realized that she would need 24 hour care at home. He also wanted to get a nurse aid into her home to make sure she is taking her medication and caring for her as well. [name] wanted a few more days to get acclimated to the situation and for a stable plan to be made regarding his mothers care. MSW and CSW will continue to follow up.    Expected Discharge Plan: [place] Barriers to Discharge: Continued Medical Work up  Expected Discharge Plan and Services Expected Discharge Plan: [place] In-house Referral: Clinical Social Work Discharge Planning Services: CM Consult Post Acute Care Choice: [place] arrangements for the past 2 months: Single Family Home                                       Social Determinants of Health (SDOH) Interventions    Readmission Risk  Interventions Readmission Risk Prevention Plan 11/02/2019  Transportation Screening Complete  PCP or Specialist Appt within 3-5 Days Complete  HRI or [place] Complete  Social Work Consult for [place] Planning/Counseling Complete  Palliative Care Screening Not Applicable  Medication Review Press photographer) Referral to Pharmacy  Some recent data might be hidden

## 2020-02-13 NOTE — Plan of Care (Signed)
Patient is now A/Ox3 with confusion to situation. Remains impulsive and continues to remove IV's that are placed which has prevented IV medication from being administered this shift. Accu check elevated @ 1600 check. MD aware and sliding scale administered. Patient has been up in chair majority of the shift. Tele d/c'd today. Call light in reach, bed/chair alarm in place.   Problem: Education: Goal: Knowledge of General Education information will improve Description: Including pain rating scale, medication(s)/side effects and non-pharmacologic comfort measures Outcome: Progressing   Problem: Health Behavior/Discharge Planning: Goal: Ability to manage health-related needs will improve Outcome: Progressing   Problem: Clinical Measurements: Goal: Ability to maintain clinical measurements within normal limits will improve Outcome: Progressing Goal: Will remain free from infection Outcome: Progressing Goal: Diagnostic test results will improve Outcome: Progressing Goal: Respiratory complications will improve Outcome: Progressing Goal: Cardiovascular complication will be avoided Outcome: Progressing   Problem: Activity: Goal: Risk for activity intolerance will decrease Outcome: Progressing   Problem: Nutrition: Goal: Adequate nutrition will be maintained Outcome: Progressing   Problem: Coping: Goal: Level of anxiety will decrease Outcome: Progressing   Problem: Elimination: Goal: Will not experience complications related to bowel motility Outcome: Progressing Goal: Will not experience complications related to urinary retention Outcome: Progressing   Problem: Pain Managment: Goal: General experience of comfort will improve Outcome: Progressing   Problem: Safety: Goal: Ability to remain free from injury will improve Outcome: Progressing   Problem: Skin Integrity: Goal: Risk for impaired skin integrity will decrease Outcome: Progressing   Problem: Education: Goal:  Knowledge of risk factors and measures for prevention of condition will improve Outcome: Progressing   Problem: Coping: Goal: Psychosocial and spiritual needs will be supported Outcome: Progressing   Problem: Respiratory: Goal: Will maintain a patent airway Outcome: Progressing Goal: Complications related to the disease process, condition or treatment will be avoided or minimized Outcome: Progressing   Problem: Education: Goal: Knowledge of disease or condition will improve Outcome: Progressing Goal: Knowledge of secondary prevention will improve Outcome: Progressing Goal: Knowledge of patient specific risk factors addressed and post discharge goals established will improve Outcome: Progressing   Problem: Coping: Goal: Will verbalize positive feelings about self Outcome: Progressing Goal: Will identify appropriate support needs Outcome: Progressing   Problem: Health Behavior/Discharge Planning: Goal: Ability to manage health-related needs will improve Outcome: Progressing   Problem: Self-Care: Goal: Ability to participate in self-care as condition permits will improve Outcome: Progressing Goal: Verbalization of feelings and concerns over difficulty with self-care will improve Outcome: Progressing Goal: Ability to communicate needs accurately will improve Outcome: Progressing   Problem: Nutrition: Goal: Risk of aspiration will decrease Outcome: Progressing Goal: Dietary intake will improve Outcome: Progressing

## 2020-02-13 NOTE — Progress Notes (Signed)
Physical Therapy Treatment Patient Details Name: Monique Cabrera MRN: [id number] DOB: 11-Dec-1935 Today's Date: 02/13/2020    History of Present Illness 85 yo female adm to [facility] with AMS, differential is COVID encephalopathy, CVA or seizure.  Pt PMH significant for stroke, DM2, Diabetic retinopathy, PAD, chronic hep B, herpetic neuralgia, gait abnormality, left gangrene s/p BKA in 10/2019.  MRI ordered for when able to conduct MRI w/ + COVID.  CT head negative for acute event.    PT Comments    Pt found in bed with covers pulled up over her head. Phone rang and she was able to answer it independently and was speaking with grandson. Pt asked therapist "Do I have the COVID?". When therapist stated she did, she looked quizzically back at therapist and reported back to grandson that she was COVID positive. Pt agreeable to getting up to chair. When covers were pulled back pt found to soaked in urine. Pt min guard to come to EoB where she was wiped down and then was maxA for sit>stand for pericare prior to pivot transfer to recliner. D/c plans remain appropriate at this time. PT will continue to follow acutely.     Follow Up Recommendations  SNF     Equipment Recommendations  Other (comment)(TBA)       Precautions / Restrictions Precautions Precautions: Fall;Other (comment) Precaution Comments: R BKA Restrictions Weight Bearing Restrictions: No    Mobility  Bed Mobility Overal bed mobility: Needs Assistance Bed Mobility: Supine to Sit     Supine to sit: Min guard     General bed mobility comments: min guard for safety with pt moving toward EoB  Transfers Overall transfer level: Needs assistance Equipment used: Rolling walker (2 wheeled);None;1 person hand held assist Transfers: Sit to/from [facility] Sit to Stand: Max assist Stand pivot transfers: Max assist       General transfer comment: maxA for sit to stand with blocking of R knee, pt able to stand for  pericare and then pivot chair   Ambulation/Gait             General Gait Details: unable to attempt due to weakness and unfamilarity with RW use    Modified Rankin (Stroke Patients Only) Modified Rankin (Stroke Patients Only) Pre-Morbid Rankin Score: No symptoms Modified Rankin: Severe disability     Balance Overall balance assessment: Needs assistance Sitting-balance support: Feet supported;No upper extremity supported Sitting balance-Leahy Scale: Fair Sitting balance - Comments: able to attain fair seated balance with close supervision   Standing balance support: Bilateral upper extremity supported;During functional activity Standing balance-Leahy Scale: Poor Standing balance comment: with knee locked out and holding onto therapist with B UE pt able to maintain static stand                            Cognition Arousal/Alertness: Awake/alert Behavior During Therapy: Restless Overall Cognitive Status: No family/caregiver present to determine baseline cognitive functioning Area of Impairment: Attention;Following commands;Orientation                 Orientation Level: Disoriented to;Situation;Place Current Attention Level: Selective   Following Commands: Follows one step commands inconsistently;Follows multi-step commands inconsistently       General Comments: pt with covers pulled up over her head on entry with bed saturated in urine and tele pulled off          General Comments General comments (skin integrity, edema, etc.): pt removed herself from  tele and pulled out her IV, no signs of SoB with movement on RA      Pertinent Vitals/Pain Faces Pain Scale: No hurt           PT Goals (current goals can now be found in the care plan section) Acute Rehab PT Goals Patient Stated Goal: unable PT Goal Formulation: Patient unable to participate in goal setting Time For Goal Achievement: 02/23/20 Potential to Achieve Goals: Fair     Frequency    Min 2X/week      PT Plan Current plan remains appropriate       AM-PAC PT "6 Clicks" Mobility   Outcome Measure  Help needed turning from your back to your side while in a flat bed without using bedrails?: Total Help needed moving from lying on your back to sitting on the side of a flat bed without using bedrails?: Total Help needed moving to and from a bed to a chair (including a wheelchair)?: Total Help needed standing up from a chair using your arms (e.g., wheelchair or bedside chair)?: Total Help needed to walk in hospital room?: Total Help needed climbing 3-5 steps with a railing? : Total 6 Click Score: 6    End of Session Equipment Utilized During Treatment: Gait belt Activity Tolerance: Patient tolerated treatment well(VSS) Patient left: with call bell/phone within reach;in chair;with chair alarm set Nurse Communication: Mobility status PT Visit Diagnosis: Other abnormalities of gait and mobility (R26.89);Other symptoms and signs involving the nervous system (E32.122)     Time: 4825-0037 PT Time Calculation (min) (ACUTE ONLY): 21 min  Charges:  $Therapeutic Activity: 8-22 mins                     Jeron Grahn B. Migdalia Dk PT, DPT Acute Rehabilitation Services Pager [phone] Office [phone]    [place] 02/13/2020, 2:29 PM

## 2020-02-14 DIAGNOSIS — R1311 Dysphagia, oral phase: Secondary | ICD-10-CM | POA: Diagnosis not present

## 2020-02-14 DIAGNOSIS — E785 Hyperlipidemia, unspecified: Secondary | ICD-10-CM | POA: Diagnosis not present

## 2020-02-14 DIAGNOSIS — Z743 Need for continuous supervision: Secondary | ICD-10-CM | POA: Diagnosis not present

## 2020-02-14 DIAGNOSIS — I5030 Unspecified diastolic (congestive) heart failure: Secondary | ICD-10-CM | POA: Diagnosis not present

## 2020-02-14 DIAGNOSIS — M2550 Pain in unspecified joint: Secondary | ICD-10-CM | POA: Diagnosis not present

## 2020-02-14 DIAGNOSIS — E1129 Type 2 diabetes mellitus with other diabetic kidney complication: Secondary | ICD-10-CM | POA: Diagnosis not present

## 2020-02-14 DIAGNOSIS — R569 Unspecified convulsions: Secondary | ICD-10-CM | POA: Diagnosis not present

## 2020-02-14 DIAGNOSIS — Z7401 Bed confinement status: Secondary | ICD-10-CM | POA: Diagnosis not present

## 2020-02-14 DIAGNOSIS — U071 COVID-19: Secondary | ICD-10-CM | POA: Diagnosis not present

## 2020-02-14 DIAGNOSIS — E1121 Type 2 diabetes mellitus with diabetic nephropathy: Secondary | ICD-10-CM | POA: Diagnosis not present

## 2020-02-14 DIAGNOSIS — E871 Hypo-osmolality and hyponatremia: Secondary | ICD-10-CM | POA: Diagnosis not present

## 2020-02-14 DIAGNOSIS — Z89511 Acquired absence of right leg below knee: Secondary | ICD-10-CM | POA: Diagnosis not present

## 2020-02-14 DIAGNOSIS — M6281 Muscle weakness (generalized): Secondary | ICD-10-CM | POA: Diagnosis not present

## 2020-02-14 DIAGNOSIS — N1832 Chronic kidney disease, stage 3b: Secondary | ICD-10-CM | POA: Diagnosis not present

## 2020-02-14 DIAGNOSIS — R7989 Other specified abnormal findings of blood chemistry: Secondary | ICD-10-CM | POA: Diagnosis not present

## 2020-02-14 DIAGNOSIS — I739 Peripheral vascular disease, unspecified: Secondary | ICD-10-CM | POA: Diagnosis not present

## 2020-02-14 DIAGNOSIS — N179 Acute kidney failure, unspecified: Secondary | ICD-10-CM | POA: Diagnosis not present

## 2020-02-14 DIAGNOSIS — G9341 Metabolic encephalopathy: Secondary | ICD-10-CM | POA: Diagnosis not present

## 2020-02-14 DIAGNOSIS — I10 Essential (primary) hypertension: Secondary | ICD-10-CM | POA: Diagnosis not present

## 2020-02-14 DIAGNOSIS — D649 Anemia, unspecified: Secondary | ICD-10-CM | POA: Diagnosis not present

## 2020-02-14 DIAGNOSIS — N189 Chronic kidney disease, unspecified: Secondary | ICD-10-CM | POA: Diagnosis not present

## 2020-02-14 LAB — BASIC METABOLIC PANEL
Anion gap: 9 (ref 5–15)
BUN: 25 mg/dL — ABNORMAL HIGH (ref 8–23)
CO2: 21 mmol/L — ABNORMAL LOW (ref 22–32)
Calcium: 9.1 mg/dL (ref 8.9–10.3)
Chloride: 104 mmol/L (ref 98–111)
Creatinine, Ser: 1.7 mg/dL — ABNORMAL HIGH (ref 0.44–1.00)
GFR calc Af Amer: 32 mL/min — ABNORMAL LOW (ref >60)
GFR calc non Af Amer: 28 mL/min — ABNORMAL LOW (ref >60)
Glucose, Bld: 209 mg/dL — ABNORMAL HIGH (ref 70–99)
Potassium: 3.9 mmol/L (ref 3.5–5.1)
Sodium: 134 mmol/L — ABNORMAL LOW (ref 135–145)

## 2020-02-14 LAB — MAGNESIUM: Magnesium: 1.3 mg/dL — ABNORMAL LOW (ref 1.7–2.4)

## 2020-02-14 LAB — GLUCOSE, CAPILLARY
Glucose-Capillary: 193 mg/dL — ABNORMAL HIGH (ref 70–99)
Glucose-Capillary: 225 mg/dL — ABNORMAL HIGH (ref 70–99)

## 2020-02-14 MED ORDER — INSULIN ASPART 100 UNIT/ML ~~LOC~~ SOLN: SUBCUTANEOUS | 11 refills | Status: DC

## 2020-02-14 MED ORDER — MAGNESIUM OXIDE 400 (241.3 MG) MG PO TABS
800.0000 mg | ORAL_TABLET | Freq: Three times a day (TID) | ORAL | Status: DC
  Administered 2020-02-14: 800 mg via ORAL
  Filled 2020-02-14: qty 2

## 2020-02-14 MED ORDER — ENSURE ENLIVE PO LIQD: 237.0000 mL | Freq: Three times a day (TID) | ORAL | 12 refills | Status: DC

## 2020-02-14 MED ORDER — LEVETIRACETAM 250 MG PO TABS: 250.0000 mg | ORAL_TABLET | Freq: Two times a day (BID) | ORAL | Status: DC

## 2020-02-14 MED ORDER — GLUCERNA SHAKE PO LIQD: 237.0000 mL | Freq: Three times a day (TID) | ORAL | 0 refills | Status: DC

## 2020-02-14 MED ORDER — MAGNESIUM OXIDE 400 (241.3 MG) MG PO TABS: 800.0000 mg | ORAL_TABLET | Freq: Three times a day (TID) | ORAL | 0 refills | Status: AC

## 2020-02-14 NOTE — Discharge Summary (Signed)
PATIENT DETAILS Name: Monique Cabrera Age: 85 y.o. Sex: female Date of Birth: [birth year] MRN: [id number]. Admitting Physician: Karmen Bongo, MD GPQ:DIYM, Rexene Edison, DO  Admit Date: 02/08/2020 Discharge date: 02/14/2020  Recommendations for Outpatient Follow-up:  1. Follow up with PCP in 1-2 weeks 2. Please obtain CMP/CBC in one week 3. Repeat Chest Xray in 4-6 week 4. Please consider outpatient follow-up with neurology.   Admitted From:  Home  Disposition: SNF   Home Health: No  Equipment/Devices: None  Discharge Condition: Stable  CODE STATUS:  DNR  Diet recommendation:  Diet Order            Diet - low sodium heart healthy        Diet Carb Modified        DIET DYS 2 Room service appropriate? No; Fluid consistency: Thin  Diet effective now               Brief Summary: See H&P, Labs, Consult and Test reports for all details in brief, Patient is a 85 y.o. 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  Brief Hospital Course: Acute metabolic encephalopathy with Todd's paresis likely secondary to new onset seizures: No further right-sided weakness-MRI brain negative for CVA-CTA neck with no high-grade stenosis.  EEG without any seizures.  Evaluated by neurology-thought to have Todd's paresis and right-sided weakness-recommendations are to continue with Keppra.  Confusion has significantly improved-however does appear to have some intermittent sundowning episodes where she gets confused and pulled out telemetry leads and peripheral IV lines.  This morning she is awake and alert. Will need outpatient neurology follow-up.  COVID-19 infection: Completed a course of remdesivir-she is on room air this morning.  COVID-19 Labs:  Recent Labs    02/12/20 0332 02/13/20 0504  DDIMER 3.29*  3.60*  FERRITIN 759*  --   CRP 0.8 0.6    Lab Results  Component Value Date   SARSCOV2NAA POSITIVE (A) 02/08/2020   [place] NEGATIVE 10/29/2019     COVID-19 Medications: Remdesivir: 2/12>> 2/16  HTN: C controlled-ontinue Norvasc/Coreg.  Diovan/HCTZ on hold.  HLD: Continue statin  DM with mild DKA on admission (A1c 11.0 on 02/09/2020): Appears to have brittle diabetes-previously on Lantus but was stopped due to hypoglycemic episodes-CBGs relatively stable on SSI.  Allow permissive hyperglycemia given brittle diabetes.  PAD:s/p right BKA-on aspirin/Plavix/statin  AKI on Stage 3a EBR:AXENMM hemodynamically mediated AKI-improved-creatinine close to baseline.  Volume status is stable.  No need for IV fluids.    Hypomagnesemia: continue to replete orally-patient has pulled out numerous IV lines ( and tele leads).Recheck in a week.  Nutrition Problem: Nutrition Problem: Inadequate oral intake Etiology: inability to eat, lethargy/confusion Signs/Symptoms: NPO status Interventions: Refer to RD note for recommendations   Procedures/Studies: None  Discharge Diagnoses:  Principal Problem:   Acute metabolic encephalopathy Active Problems:   Peripheral arterial disease ([place])   Hypertension   Type II diabetes mellitus with renal manifestations, uncontrolled ([place])   Hyperlipidemia   COVID-19 virus infection   Acute kidney injury superimposed on CKD [name])   Discharge Instructions:    Person Under Monitoring Name: Monique Cabrera  Location: [place] [address]   Infection Prevention Recommendations for Individuals Confirmed to have, or Being Evaluated for, 2019 Novel Coronavirus (COVID-19) Infection Who Receive Care at Home  Individuals who  are confirmed to have, or are being evaluated for, COVID-19 should follow the prevention steps below until a healthcare provider or local or state health department says they can return to normal  activities.  Stay home except to get medical care You should restrict activities outside your home, except for getting medical care. Do not go to work, school, or public areas, and do not use public transportation or taxis.  Call ahead before visiting your doctor Before your medical appointment, call the healthcare provider and tell them that you have, or are being evaluated for, COVID-19 infection. This will help the healthcare provider's office take steps to keep other people from getting infected. Ask your healthcare provider to call the local or state health department.  Monitor your symptoms Seek prompt medical attention if your illness is worsening (e.g., difficulty breathing). Before going to your medical appointment, call the healthcare provider and tell them that you have, or are being evaluated for, COVID-19 infection. Ask your healthcare provider to call the local or state health department.  Wear a facemask You should wear a facemask that covers your nose and mouth when you are in the same room with other people and when you visit a healthcare provider. People who live with or visit you should also wear a facemask while they are in the same room with you.  Separate yourself from other people in your home As much as possible, you should stay in a different room from other people in your home. Also, you should use a separate bathroom, if available.  Avoid sharing household items You should not share dishes, drinking glasses, cups, eating utensils, towels, bedding, or other items with other people in your home. After using these items, you should wash them thoroughly with soap and water.  Cover your coughs and sneezes Cover your mouth and nose with a tissue when you cough or sneeze, or you can cough or sneeze into your sleeve. Throw used tissues in a lined trash can, and immediately wash your hands with soap and water for at least 20 seconds or use an alcohol-based hand  rub.  Wash your [facility] your hands often and thoroughly with soap and water for at least 20 seconds. You can use an alcohol-based hand sanitizer if soap and water are not available and if your hands are not visibly dirty. Avoid touching your eyes, nose, and mouth with unwashed hands.   Prevention Steps for Caregivers and Household Members of Individuals Confirmed to have, or Being Evaluated for, COVID-19 Infection Being Cared for in the Home  If you live with, or provide care at home for, a person confirmed to have, or being evaluated for, COVID-19 infection please follow these guidelines to prevent infection:  Follow healthcare provider's instructions Make sure that you understand and can help the patient follow any healthcare provider instructions for all care.  Provide for the patient's basic needs You should help the patient with basic needs in the home and provide support for getting groceries, prescriptions, and other personal needs.  Monitor the patient's symptoms If they are getting sicker, call his or her medical provider and tell them that the patient has, or is being evaluated for, COVID-19 infection. This will help the healthcare provider's office take steps to keep other people from getting infected. Ask the healthcare provider to call the local or state health department.  Limit the number of people who have contact with the patient  If possible, have only one caregiver for the patient.  Other household members should stay in another home or place of residence. If this is not possible, they should stay  in another room, or be separated from the patient as much as possible. Use a separate bathroom, if available.  Restrict visitors who do not have an essential need to be in the home.  Keep older adults, very young children, and other sick people away from the patient Keep older adults, very young children, and those who have compromised immune systems or chronic  health conditions away from the patient. This includes people with chronic heart, lung, or kidney conditions, diabetes, and cancer.  Ensure good ventilation Make sure that shared spaces in the home have good air flow, such as from an air conditioner or an opened window, weather permitting.  Wash your hands often  Wash your hands often and thoroughly with soap and water for at least 20 seconds. You can use an alcohol based hand sanitizer if soap and water are not available and if your hands are not visibly dirty.  Avoid touching your eyes, nose, and mouth with unwashed hands.  Use disposable paper towels to dry your hands. If not available, use dedicated cloth towels and replace them when they become wet.  Wear a facemask and gloves  Wear a disposable facemask at all times in the room and gloves when you touch or have contact with the patient's blood, body fluids, and/or secretions or excretions, such as sweat, saliva, sputum, nasal mucus, vomit, urine, or feces.  Ensure the mask fits over your nose and mouth tightly, and do not touch it during use.  Throw out disposable facemasks and gloves after using them. Do not reuse.  Wash your hands immediately after removing your facemask and gloves.  If your personal clothing becomes contaminated, carefully remove clothing and launder. Wash your hands after handling contaminated clothing.  Place all used disposable facemasks, gloves, and other waste in a lined container before disposing them with other household waste.  Remove gloves and wash your hands immediately after handling these items.  Do not share dishes, glasses, or other household items with the patient  Avoid sharing household items. You should not share dishes, drinking glasses, cups, eating utensils, towels, bedding, or other items with a patient who is confirmed to have, or being evaluated for, COVID-19 infection.  After the person uses these items, you should wash them  thoroughly with soap and water.  Wash laundry thoroughly  Immediately remove and wash clothes or bedding that have blood, body fluids, and/or secretions or excretions, such as sweat, saliva, sputum, nasal mucus, vomit, urine, or feces, on them.  Wear gloves when handling laundry from the patient.  Read and follow directions on labels of laundry or clothing items and detergent. In general, wash and dry with the warmest temperatures recommended on the label.  Clean all areas the individual has used often  Clean all touchable surfaces, such as counters, tabletops, doorknobs, bathroom fixtures, toilets, phones, keyboards, tablets, and bedside tables, every day. Also, clean any surfaces that may have blood, body fluids, and/or secretions or excretions on them.  Wear gloves when cleaning surfaces the patient has come in contact with.  Use a diluted bleach solution (e.g., dilute bleach with 1 part bleach and 10 parts water) or a household disinfectant with a label that says EPA-registered for coronaviruses. To make a bleach solution at home, add 1 tablespoon of bleach to 1 quart (4 cups) of water. For a larger supply, add  cup of  bleach to 1 gallon (16 cups) of water.  Read labels of cleaning products and follow recommendations provided on product labels. Labels contain instructions for safe and effective use of the cleaning product including precautions you should take when applying the product, such as wearing gloves or eye protection and making sure you have good ventilation during use of the product.  Remove gloves and wash hands immediately after cleaning.  Monitor yourself for signs and symptoms of illness Caregivers and household members are considered close contacts, should monitor their health, and will be asked to limit movement outside of the home to the extent possible. Follow the monitoring steps for close contacts listed on the symptom monitoring form.   ? If you have additional  questions, contact your local health department or call the epidemiologist on call at [phone] (available 24/7). ? This guidance is subject to change. For the most up-to-date guidance from [facility], please refer to their website: [email or web address]    Activity:  As tolerated with Full fall precautions use walker/cane & assistance as needed   Discharge Instructions    Ambulatory referral to Neurology   Complete by: As directed    Follow up with stroke clinic NP (Jessica Vanschaick or Cecille Rubin, if both not available, consider Zachery Dauer, or [place]) at [facility] in about 4 weeks. Thanks.   Call MD for:  difficulty breathing, headache or visual disturbances   Complete by: As directed    Call MD for:  extreme fatigue   Complete by: As directed    Call MD for:  persistant dizziness or light-headedness   Complete by: As directed    Call MD for:  persistant nausea and vomiting   Complete by: As directed    Diet - low sodium heart healthy   Complete by: As directed    Diet Carb Modified   Complete by: As directed    Discharge instructions   Complete by: As directed    Follow with Primary MD  Mariea Clonts, Tiffany L, DO in 1-2 weeks  Please get a complete blood count and chemistry panel checked by your Primary MD at your next visit, and again as instructed by your Primary MD.  Get Medicines reviewed and adjusted: Please take all your medications with you for your next visit with your Primary MD  Laboratory/radiological data: Please request your Primary MD to go over all hospital tests and procedure/radiological results at the follow up, please ask your Primary MD to get all Hospital records sent to his/her office.  In some cases, they will be blood work, cultures and biopsy results pending at the time of your discharge. Please request that your primary care M.D. follows up on these results.  Also Note the following: If you experience  worsening of your admission symptoms, develop shortness of breath, life threatening emergency, suicidal or homicidal thoughts you must seek medical attention immediately by calling 911 or calling your MD immediately  if symptoms less severe.  You must read complete instructions/literature along with all the possible adverse reactions/side effects for all the Medicines you take and that have been prescribed to you. Take any new Medicines after you have completely understood and accpet all the possible adverse reactions/side effects.   Do not drive when taking Pain medications or sleeping medications (Benzodaizepines)  Do not take more than prescribed Pain, Sleep and Anxiety Medications. It is not advisable to combine anxiety,sleep and pain medications without talking with your primary care practitioner  Special Instructions: If you have smoked or  chewed Tobacco  in the last 2 yrs please stop smoking, stop any regular Alcohol  and or any Recreational drug use.  Wear Seat belts while driving.  Please note: You were cared for by a hospitalist during your hospital stay. Once you are discharged, your primary care physician will handle any further medical issues. Please note that NO REFILLS for any discharge medications will be authorized once you are discharged, as it is imperative that you return to your primary care physician (or establish a relationship with a primary care physician if you do not have one) for your post hospital discharge needs so that they can reassess your need for medications and monitor your lab values.   Increase activity slowly   Complete by: As directed      Allergies as of 02/14/2020      Reactions   Invokana [canagliflozin] Itching, Swelling, Other (See Comments)   Vaginal itching, swelling and irritation   Jardiance [empagliflozin] Itching, Swelling, Other (See Comments)   Vaginal itching and swelling      Medication List    STOP taking these medications   insulin  glargine 100 UNIT/ML injection Commonly known as: LANTUS   insulin lispro 100 UNIT/ML KwikPen Commonly known as: HumaLOG KwikPen   valsartan-hydrochlorothiazide 320-25 MG tablet Commonly known as: DIOVAN-HCT     TAKE these medications   amLODipine 10 MG tablet Commonly known as: NORVASC TAKE 1 TABLET (10 MG TOTAL) BY MOUTH DAILY. FOR HIGH BLOOD PRESSURE   aspirin 81 MG chewable tablet Chew 1 tablet (81 mg total) by mouth daily.   atorvastatin 20 MG tablet Commonly known as: LIPITOR TAKE 1 TABLET BY MOUTH EVERY DAY   B-D ULTRAFINE III SHORT PEN 31G X 8 MM Misc Generic drug: Insulin Pen Needle Use to check blood sugar every day. Dx: 11.29; 11.65   carvedilol 25 MG tablet Commonly known as: COREG TAKE 1 TABLET BY MOUTH TWICE A DAY WITH MEALS   clopidogrel 75 MG tablet Commonly known as: PLAVIX TAKE 1 TABLET BY MOUTH EVERY DAY   feeding supplement (GLUCERNA SHAKE) Liqd Take 237 mLs by mouth 3 (three) times daily between meals.   feeding supplement (ENSURE ENLIVE) Liqd Take 237 mLs by mouth 3 (three) times daily between meals.   gabapentin 100 MG capsule Commonly known as: NEURONTIN Take 100 mg by mouth 3 (three) times daily.   glucose 4 GM chewable tablet Chew 1 tablet (4 g total) by mouth as needed for low blood sugar.   insulin aspart 100 UNIT/ML injection Commonly known as: novoLOG 0-9 Units, Subcutaneous, 3 times daily with meals CBG < 70: Implement Hypoglycemia measures,call MD CBG 70 - 120: 0 units CBG 121 - 150: 1 unit CBG 151 - 200: 2 units CBG 201 - 250: 3 units CBG 251 - 300: 5 units CBG 301 - 350: 7 units CBG 351 - 400: 9 units CBG > 400: call MD   levETIRAcetam 250 MG tablet Commonly known as: KEPPRA Take 1 tablet (250 mg total) by mouth 2 (two) times daily.   magnesium oxide 400 (241.3 Mg) MG tablet Commonly known as: MAG-OX Take 2 tablets (800 mg total) by mouth 3 (three) times daily for 2 days.   OneTouch Verio test strip Generic drug:  glucose blood USE TO TEST BLOOD SUGAR THREE TIMES DAILY. DX: E11.9       Contact information for follow-up providers    [facility]. Schedule an appointment as soon as possible for a visit in 4  week(s).   Specialty: Neurology Contact information: [place] [phone]       Mariea Clonts, [place], DO. Schedule an appointment as soon as possible for a visit in 1 week(s).   Specialty: Geriatric Medicine Contact information: [place] [address] [phone]        Wellington Hampshire, MD. Schedule an appointment as soon as possible for a visit in 2 week(s).   Specialty: Cardiology Contact information: [address] [place] [address] [phone]            Contact information for after-discharge care    Destination    [facility] .   Service: Skilled Nursing Contact information: [address] [place] [phone]                 Allergies  Allergen Reactions  . Invokana [Canagliflozin] Itching, Swelling and Other (See Comments)    Vaginal itching, swelling and irritation  . Jardiance [Empagliflozin] Itching, Swelling and Other (See Comments)    Vaginal itching and swelling    Consultations:   neurology  Other Procedures/Studies: CT ANGIO HEAD W OR WO CONTRAST  Addendum Date: 02/08/2020   ADDENDUM REPORT: 02/08/2020 14:54 ADDENDUM: A 2 mm left MCA bifurcation aneurysm is stable from the prior exam. This is noted on further inquiry for explanation of right-sided symptoms. This is unlikely to be related to the patient's symptoms. Electronically Signed   By: San Morelle M.D.   On: 02/08/2020 14:54   Result Date: 02/08/2020 CLINICAL DATA:  Right-sided weakness. EXAM: CT ANGIOGRAPHY HEAD AND NECK TECHNIQUE: Multidetector CT imaging of the head and neck was performed using the standard protocol during  bolus administration of intravenous contrast. Multiplanar CT image reconstructions and MIPs were obtained to evaluate the vascular anatomy. Carotid stenosis measurements (when applicable) are obtained utilizing NASCET criteria, using the distal internal carotid diameter as the denominator. CONTRAST:  63mL OMNIPAQUE IOHEXOL 350 MG/ML SOLN COMPARISON:  None. FINDINGS: CTA NECK FINDINGS Aortic arch: Atherosclerotic calcifications are present within the aortic arch and at the origins the great vessels without significant stenosis or change. There is no aneurysm. Right carotid system: Right common carotid artery is within normal limits. Bifurcation is somewhat obscured by patient motion. No significant stenosis is present. Mild tortuosity is present in the cervical right ICA without significant stenosis. Left carotid system: Left common carotid artery is tortuous. Atherosclerotic changes are present at the bifurcation without significant stenosis. There is mild tortuosity of the cervical left ICA without significant stenosis. Vertebral arteries: The left vertebral artery is the dominant vessel. Both vertebral arteries originate from the subclavian arteries without significant stenosis. There is no definite stenosis in the neck. Skeleton: Degenerative changes are present lower cervical spine. Osseous structures are somewhat distorted by patient motion. No acute abnormalities are present. Other neck: The soft tissues the neck are otherwise unremarkable. Upper chest: Centrilobular emphysematous changes are present. No nodule or mass lesion is present. Thoracic inlet is normal. Review of the MIP images confirms the above findings CTA HEAD FINDINGS Anterior circulation: Atherosclerotic changes are present within the cavernous internal carotid arteries bilaterally without significant stenosis. Mild narrowing is present in the proximal right A1 segment. No emergent large vessel occlusion is present. There is some attenuation of  distal small vessels. Posterior circulation: The left vertebral artery is the dominant vessel. No significant stenosis is present. There is mild narrowing the mid  basilar artery. Both posterior cerebral arteries originate from basilar tip. Distal vessel attenuation is present without a significant proximal stenosis or occlusion. Venous sinuses: The dural sinuses patent. The straight sinus and deep cerebral veins are intact. Cortical veins are distorted by patient motion. Anatomic variants: None Review of the MIP images confirms the above findings IMPRESSION: 1. No emergent large vessel occlusion. 2. Atherosclerotic changes at the aortic arch, left carotid bifurcation, and cavernous internal carotid arteries bilaterally without significant stenosis. 3. Mild narrowing of the proximal right A1 segment. 4. Distal small vessel attenuation without a significant proximal stenosis, aneurysm, or branch vessel occlusion within the Circle of Willis. These results were called by telephone at the time of interpretation on 02/08/2020 at 10:05am to provider Dr. Lorraine Lax, Who verbally acknowledged these results. Electronically Signed: By: San Morelle M.D. On: 02/08/2020 10:34   CT Angio Neck W and/or Wo Contrast  Addendum Date: 02/08/2020   ADDENDUM REPORT: 02/08/2020 14:54 ADDENDUM: A 2 mm left MCA bifurcation aneurysm is stable from the prior exam. This is noted on further inquiry for explanation of right-sided symptoms. This is unlikely to be related to the patient's symptoms. Electronically Signed   By: San Morelle M.D.   On: 02/08/2020 14:54   Result Date: 02/08/2020 CLINICAL DATA:  Right-sided weakness. EXAM: CT ANGIOGRAPHY HEAD AND NECK TECHNIQUE: Multidetector CT imaging of the head and neck was performed using the standard protocol during bolus administration of intravenous contrast. Multiplanar CT image reconstructions and MIPs were obtained to evaluate the vascular anatomy. Carotid stenosis  measurements (when applicable) are obtained utilizing NASCET criteria, using the distal internal carotid diameter as the denominator. CONTRAST:  78mL OMNIPAQUE IOHEXOL 350 MG/ML SOLN COMPARISON:  None. FINDINGS: CTA NECK FINDINGS Aortic arch: Atherosclerotic calcifications are present within the aortic arch and at the origins the great vessels without significant stenosis or change. There is no aneurysm. Right carotid system: Right common carotid artery is within normal limits. Bifurcation is somewhat obscured by patient motion. No significant stenosis is present. Mild tortuosity is present in the cervical right ICA without significant stenosis. Left carotid system: Left common carotid artery is tortuous. Atherosclerotic changes are present at the bifurcation without significant stenosis. There is mild tortuosity of the cervical left ICA without significant stenosis. Vertebral arteries: The left vertebral artery is the dominant vessel. Both vertebral arteries originate from the subclavian arteries without significant stenosis. There is no definite stenosis in the neck. Skeleton: Degenerative changes are present lower cervical spine. Osseous structures are somewhat distorted by patient motion. No acute abnormalities are present. Other neck: The soft tissues the neck are otherwise unremarkable. Upper chest: Centrilobular emphysematous changes are present. No nodule or mass lesion is present. Thoracic inlet is normal. Review of the MIP images confirms the above findings CTA HEAD FINDINGS Anterior circulation: Atherosclerotic changes are present within the cavernous internal carotid arteries bilaterally without significant stenosis. Mild narrowing is present in the proximal right A1 segment. No emergent large vessel occlusion is present. There is some attenuation of distal small vessels. Posterior circulation: The left vertebral artery is the dominant vessel. No significant stenosis is present. There is mild narrowing  the mid basilar artery. Both posterior cerebral arteries originate from basilar tip. Distal vessel attenuation is present without a significant proximal stenosis or occlusion. Venous sinuses: The dural sinuses patent. The straight sinus and deep cerebral veins are intact. Cortical veins are distorted by patient motion. Anatomic variants: None Review of the MIP images confirms the above findings IMPRESSION:  1. No emergent large vessel occlusion. 2. Atherosclerotic changes at the aortic arch, left carotid bifurcation, and cavernous internal carotid arteries bilaterally without significant stenosis. 3. Mild narrowing of the proximal right A1 segment. 4. Distal small vessel attenuation without a significant proximal stenosis, aneurysm, or branch vessel occlusion within the Circle of Willis. These results were called by telephone at the time of interpretation on 02/08/2020 at 10:05am to provider Dr. Lorraine Lax, Who verbally acknowledged these results. Electronically Signed: By: San Morelle M.D. On: 02/08/2020 10:34   MR BRAIN WO CONTRAST  Result Date: 02/10/2020 CLINICAL DATA:  Acute presentation with worsened level of consciousness and speech disturbance. EXAM: MRI HEAD WITHOUT CONTRAST TECHNIQUE: Multiplanar, multiecho pulse sequences of the brain and surrounding structures were obtained without intravenous contrast. COMPARISON:  CT studies 02/08/2020 FINDINGS: Brain: Diffusion imaging does not show any acute or subacute infarction. Chronic small-vessel ischemic changes affect the pons. No focal cerebellar insult. Cerebral hemispheres show generalized atrophy with mild chronic small-vessel change of the white matter considering age. No cortical or large vessel territory infarction. No mass lesion, hemorrhage, hydrocephalus or extra-axial collection. Vascular: Major vessels at the base of the brain show flow. Skull and upper cervical spine: Negative Sinuses/Orbits: Clear/normal Other: None IMPRESSION: No acute  finding. Age related atrophy. Chronic small-vessel ischemic changes of the pons and cerebral hemispheric white matter, fairly mild considering age. Electronically Signed   By: Nelson Chimes M.D.   On: 02/10/2020 03:55   DG CHEST PORT 1 VIEW  Result Date: 02/08/2020 CLINICAL DATA:  COVID-19 positive, altered mental status EXAM: PORTABLE CHEST 1 VIEW COMPARISON:  Radiograph 10/04/2019 FINDINGS: Coarse interstitial changes are similar to priors. No consolidation, features of edema, pneumothorax, or effusion. Pulmonary vascularity is normally distributed. The aorta is calcified. The remaining cardiomediastinal contours are unremarkable. No acute osseous or soft tissue abnormality. No acute osseous or soft tissue abnormality. IMPRESSION: No acute cardiopulmonary findings. Coarse interstitial changes are similar to priors. Aortic Atherosclerosis (ICD10-I70.0). Electronically Signed   By: Lovena Le M.D.   On: 02/08/2020 19:47   DG Abd Portable 1V  Result Date: 02/09/2020 CLINICAL DATA:  Metal screening prior to MRI. EXAM: PORTABLE ABDOMEN - 1 VIEW COMPARISON:  CT abdomen and pelvis 08/21/2012 FINDINGS: No radiopaque foreign body is identified in the abdomen or pelvis with incomplete imaging of the inferior pelvic ring. No dilated loops of bowel are seen to suggest obstruction. Atherosclerotic vascular calcifications are noted. The visualized lung bases are grossly clear. IMPRESSION: No metallic foreign body identified. Electronically Signed   By: Logan Bores M.D.   On: 02/09/2020 10:04   EEG adult  Result Date: 02/08/2020 Lora Havens, MD     02/08/2020  1:32 PM Patient Name: GLENA PHARRIS MRN: [id number] Epilepsy Attending: Lora Havens Referring Physician/Provider: Laurey Morale, NP Date: 02/08/2020 Duration: 36.20 minutes Patient history: 85 year old female with prior medical history of stroke who presented with unresponsiveness and right-sided weakness.  CT head negative for hemorrhage.  EEG  evaluate for seizures. Level of alertness: Lethargic AEDs during EEG study: None Technical aspects: This EEG study was done with scalp electrodes positioned according to the 10-20 International system of electrode placement. Electrical activity was acquired at a sampling rate of 500Hz  and reviewed with a high frequency filter of 70Hz  and a low frequency filter of 1Hz . EEG data were recorded continuously and digitally stored. Description: EEG showed continuous generalized low amplitude 2 to 3 Hz delta slowing as well as 13 to 15 Hz frontocentral  beta activity.  Hyperventilation and photic stimulation were not performed. Of note in the beginning, study was technically difficult due to significant myogenic artifact. Abnormality -Continuous slow, generalized IMPRESSION: This study is suggestive of moderate to severe diffuse encephalopathy, nonspecific etiology. No seizures or epileptiform discharges were seen throughout the recording. Lora Havens   CT HEAD CODE STROKE WO CONTRAST  Result Date: 02/08/2020 CLINICAL DATA:  Code stroke. Neuro deficit, acute, stroke suspected. EXAM: CT HEAD WITHOUT CONTRAST TECHNIQUE: Contiguous axial images were obtained from the base of the skull through the vertex without intravenous contrast. COMPARISON:  None. FINDINGS: Brain: No acute infarct, hemorrhage, or mass lesion is present. Moderate generalized atrophy and white matter disease is stable. The ventricles are proportionate to the degree of atrophy. Basal ganglia and insular cortex is bilaterally. No acute or focal cortical abnormality is present. No significant extraaxial fluid collection is present. The brainstem and cerebellum are within normal limits. Vascular: Extensive vascular calcifications extend to the MCA bifurcations bilaterally without significant interval change. No other asymmetric hyperdense vessel is present. Dense calcifications in the basilar artery vertebral arteries are also stable. Skull: Insert  normal skull No significant extracranial soft tissue lesion is present. Sinuses/Orbits: The paranasal sinuses and mastoid air cells are clear. Bilateral lens replacements are noted. Globes and orbits are otherwise unremarkable. Other: ASPECTS ([place] Stroke Program Early CT Score) - Ganglionic level infarction (caudate, lentiform nuclei, internal capsule, insula, M1-M3 cortex): 3/3 - Supraganglionic infarction (M4-M6 cortex): 7/7 Total score (0-10 with 10 being normal): 10/10 IMPRESSION: 1. No acute intracranial abnormality or significant interval change. 2. Stable advanced atrophy and diffuse white matter disease. 3. Advanced calcific atherosclerotic changes extend to the MCA bifurcations and the basilar artery are stable. 4. ASPECTS is 10/10 1. The above was relayed via text pager to Dr. Lorraine Lax on 02/08/2020 at 10:03 . Electronically Signed   By: San Morelle M.D.   On: 02/08/2020 10:04   VAS [place] LOWER EXTREMITY VENOUS (DVT)  Result Date: 02/11/2020  Lower Venous DVTStudy Indications: Elevated D-Dimer, Covid+.  Risk Factors: Surgery Right below knee amputation. Limitations: Dementia, uncooperative. Comparison Study: No prior exams. Performing Technologist: Baldwin Crown RDMS, RVT  Examination Guidelines: A complete evaluation includes B-mode imaging, spectral Doppler, color Doppler, and power Doppler as needed of all accessible portions of each vessel. Bilateral testing is considered an integral part of a complete examination. Limited examinations for reoccurring indications may be performed as noted. The reflux portion of the exam is performed with the patient in reverse Trendelenburg.  +---------+---------------+---------+-----------+----------+--------------+ RIGHT    CompressibilityPhasicitySpontaneityPropertiesThrombus Aging +---------+---------------+---------+-----------+----------+--------------+ CFV      Full           Yes      Yes                                  +---------+---------------+---------+-----------+----------+--------------+ SFJ      Full                                                        +---------+---------------+---------+-----------+----------+--------------+ FV Prox  Full                                                        +---------+---------------+---------+-----------+----------+--------------+  FV Mid   Full                                                        +---------+---------------+---------+-----------+----------+--------------+ FV DistalFull                                                        +---------+---------------+---------+-----------+----------+--------------+ PFV      Full                                                        +---------+---------------+---------+-----------+----------+--------------+ POP      Full           Yes      Yes                                 +---------+---------------+---------+-----------+----------+--------------+   +---------+---------------+---------+-----------+----------+--------------+ LEFT     CompressibilityPhasicitySpontaneityPropertiesThrombus Aging +---------+---------------+---------+-----------+----------+--------------+ CFV      Full           Yes      Yes                                 +---------+---------------+---------+-----------+----------+--------------+ SFJ      Full                                                        +---------+---------------+---------+-----------+----------+--------------+ FV Prox  Full                                                        +---------+---------------+---------+-----------+----------+--------------+ FV Mid   Full                                                        +---------+---------------+---------+-----------+----------+--------------+ FV DistalFull                                                         +---------+---------------+---------+-----------+----------+--------------+ PFV      Full                                                        +---------+---------------+---------+-----------+----------+--------------+  POP      Full           Yes      Yes                                 +---------+---------------+---------+-----------+----------+--------------+ PTV      Full                                                        +---------+---------------+---------+-----------+----------+--------------+ PERO     Full                                                        +---------+---------------+---------+-----------+----------+--------------+     Summary: BILATERAL: - No evidence of deep vein thrombosis seen in the lower extremities, bilaterally.  RIGHT: - No cystic structure found in the popliteal fossa. - Right below knee amputation.  LEFT: - No cystic structure found in the popliteal fossa.  *See table(s) above for measurements and observations. Electronically signed by Ruta Hinds MD on 02/11/2020 at 5:17:49 PM.    Final    ECHOCARDIOGRAM LIMITED  Result Date: 02/09/2020    ECHOCARDIOGRAM LIMITED REPORT   Patient Name:   SHILYNN HOCH Date of Exam: 02/09/2020 Medical Rec #:  [id number]        Height:       65.0 in Accession #:    [id number]       Weight:       132.3 lb Date of Birth:  10-30-1935        BSA:          1.66 m Patient Age:    74 years         BP:           209/93 mmHg Patient Gender: F                HR:           94 bpm. Exam Location:  Inpatient Procedure: Limited Echo and Color Doppler Indications:    CVA  History:        Patient has prior history of Echocardiogram examinations, most                 recent 02/18/2017. Stroke and PAD; Risk Factors:Hypertension,                 Diabetes and Dyslipidemia.  Sonographer:    Dustin Flock Referring Phys: [place] Comments: Image acquisition challenging due to uncooperative patient and  cant follow commands. IMPRESSIONS  1. Left ventricular ejection fraction, by estimation, is 60 to 65%. The left ventricle has normal function. The left ventricle has no regional wall motion abnormalities. Left ventricular diastolic function could not be evaluated.  2. Right ventricular systolic function is normal. The right ventricular size is normal.  3. The mitral valve is normal in structure and function. No evidence of mitral valve regurgitation. No evidence of mitral stenosis.  4. The aortic valve is normal in structure and function. Aortic valve regurgitation is not visualized. Mild aortic valve sclerosis is  present, with no evidence of aortic valve stenosis.  5. The inferior vena cava is normal in size with greater than 50% respiratory variability, suggesting right atrial pressure of 3 mmHg. Conclusion(s)/Recomendation(s): Limited study due to poor patient cooperation. FINDINGS  Left Ventricle: Left ventricular ejection fraction, by estimation, is 60 to 65%. The left ventricle has normal function. The left ventricle has no regional wall motion abnormalities. The left ventricular internal cavity size was normal in size. There is  no left ventricular hypertrophy. The left ventricular diastology could not be evaluated due to indeterminate diastolic function. Right Ventricle: The right ventricular size is normal. No increase in right ventricular wall thickness. Right ventricular systolic function is normal. Left Atrium: Left atrial size was normal in size. Right Atrium: Right atrial size was normal in size. Pericardium: There is no evidence of pericardial effusion. Mitral Valve: The mitral valve is normal in structure and function. Normal mobility of the mitral valve leaflets. No evidence of mitral valve stenosis. Tricuspid Valve: The tricuspid valve is normal in structure. Tricuspid valve regurgitation is not demonstrated. No evidence of tricuspid stenosis. Aortic Valve: The aortic valve is normal in structure  and function. Aortic valve regurgitation is not visualized. Mild aortic valve sclerosis is present, with no evidence of aortic valve stenosis. Pulmonic Valve: The pulmonic valve was normal in structure. Pulmonic valve regurgitation is not visualized. No evidence of pulmonic stenosis. Aorta: The aortic root is normal in size and structure. Venous: The inferior vena cava is normal in size with greater than 50% respiratory variability, suggesting right atrial pressure of 3 mmHg. IAS/Shunts: No atrial level shunt detected by color flow Doppler.  LEFT VENTRICLE PLAX 2D LVIDd:         3.27 cm Diastology LVIDs:         2.15 cm LV e' lateral: 6.31 cm/s LV PW:         1.07 cm LV e' medial:  3.48 cm/s LV IVS:        1.24 cm  Dani Gobble Croitoru MD Electronically signed by Sanda Klein MD Signature Date/Time: 02/09/2020/12:54:09 PM    Final     TODAY-DAY OF DISCHARGE:  Subjective:   Weyman Croon today has no headache,no chest abdominal pain,no new weakness tingling or numbness, feels much better wants to go home today.   Objective:   Blood pressure 138/70, pulse 93, temperature 98.4 F (36.9 C), temperature source Oral, resp. rate 16, height 5\' 5"  (1.651 m), weight 57.2 kg, SpO2 100 %.  Intake/Output Summary (Last 24 hours) at 02/14/2020 1135 Last data filed at 02/14/2020 0230 Gross per 24 hour  Intake 420 ml  Output --  Net 420 ml   Filed Weights   02/09/20 2037 02/12/20 0414 02/14/20 0502  Weight: 49.8 kg 52.3 kg 57.2 kg    Exam: Awake Alert,  No new F.N deficits, Normal affect [place].AT,PERRAL Supple Neck,No JVD, No cervical lymphadenopathy appriciated.  Symmetrical Chest wall movement, Good air movement bilaterally, CTAB RRR,No Gallops,Rubs or new Murmurs, No Parasternal Heave +ve B.Sounds, Abd Soft, Non tender, No organomegaly appriciated, No rebound -guarding or rigidity. No Cyanosis, Clubbing or edema, No new Rash or bruise   PERTINENT RADIOLOGIC STUDIES: CT ANGIO HEAD W OR WO  CONTRAST  Addendum Date: 02/08/2020   ADDENDUM REPORT: 02/08/2020 14:54 ADDENDUM: A 2 mm left MCA bifurcation aneurysm is stable from the prior exam. This is noted on further inquiry for explanation of right-sided symptoms. This is unlikely to be related to the patient's symptoms. Electronically Signed  By: San Morelle M.D.   On: 02/08/2020 14:54   Result Date: 02/08/2020 CLINICAL DATA:  Right-sided weakness. EXAM: CT ANGIOGRAPHY HEAD AND NECK TECHNIQUE: Multidetector CT imaging of the head and neck was performed using the standard protocol during bolus administration of intravenous contrast. Multiplanar CT image reconstructions and MIPs were obtained to evaluate the vascular anatomy. Carotid stenosis measurements (when applicable) are obtained utilizing NASCET criteria, using the distal internal carotid diameter as the denominator. CONTRAST:  29mL OMNIPAQUE IOHEXOL 350 MG/ML SOLN COMPARISON:  None. FINDINGS: CTA NECK FINDINGS Aortic arch: Atherosclerotic calcifications are present within the aortic arch and at the origins the great vessels without significant stenosis or change. There is no aneurysm. Right carotid system: Right common carotid artery is within normal limits. Bifurcation is somewhat obscured by patient motion. No significant stenosis is present. Mild tortuosity is present in the cervical right ICA without significant stenosis. Left carotid system: Left common carotid artery is tortuous. Atherosclerotic changes are present at the bifurcation without significant stenosis. There is mild tortuosity of the cervical left ICA without significant stenosis. Vertebral arteries: The left vertebral artery is the dominant vessel. Both vertebral arteries originate from the subclavian arteries without significant stenosis. There is no definite stenosis in the neck. Skeleton: Degenerative changes are present lower cervical spine. Osseous structures are somewhat distorted by patient motion. No acute  abnormalities are present. Other neck: The soft tissues the neck are otherwise unremarkable. Upper chest: Centrilobular emphysematous changes are present. No nodule or mass lesion is present. Thoracic inlet is normal. Review of the MIP images confirms the above findings CTA HEAD FINDINGS Anterior circulation: Atherosclerotic changes are present within the cavernous internal carotid arteries bilaterally without significant stenosis. Mild narrowing is present in the proximal right A1 segment. No emergent large vessel occlusion is present. There is some attenuation of distal small vessels. Posterior circulation: The left vertebral artery is the dominant vessel. No significant stenosis is present. There is mild narrowing the mid basilar artery. Both posterior cerebral arteries originate from basilar tip. Distal vessel attenuation is present without a significant proximal stenosis or occlusion. Venous sinuses: The dural sinuses patent. The straight sinus and deep cerebral veins are intact. Cortical veins are distorted by patient motion. Anatomic variants: None Review of the MIP images confirms the above findings IMPRESSION: 1. No emergent large vessel occlusion. 2. Atherosclerotic changes at the aortic arch, left carotid bifurcation, and cavernous internal carotid arteries bilaterally without significant stenosis. 3. Mild narrowing of the proximal right A1 segment. 4. Distal small vessel attenuation without a significant proximal stenosis, aneurysm, or branch vessel occlusion within the Circle of Willis. These results were called by telephone at the time of interpretation on 02/08/2020 at 10:05am to provider Dr. Lorraine Lax, Who verbally acknowledged these results. Electronically Signed: By: San Morelle M.D. On: 02/08/2020 10:34   CT Angio Neck W and/or Wo Contrast  Addendum Date: 02/08/2020   ADDENDUM REPORT: 02/08/2020 14:54 ADDENDUM: A 2 mm left MCA bifurcation aneurysm is stable from the prior exam. This is  noted on further inquiry for explanation of right-sided symptoms. This is unlikely to be related to the patient's symptoms. Electronically Signed   By: San Morelle M.D.   On: 02/08/2020 14:54   Result Date: 02/08/2020 CLINICAL DATA:  Right-sided weakness. EXAM: CT ANGIOGRAPHY HEAD AND NECK TECHNIQUE: Multidetector CT imaging of the head and neck was performed using the standard protocol during bolus administration of intravenous contrast. Multiplanar CT image reconstructions and MIPs were obtained to evaluate  the vascular anatomy. Carotid stenosis measurements (when applicable) are obtained utilizing NASCET criteria, using the distal internal carotid diameter as the denominator. CONTRAST:  15mL OMNIPAQUE IOHEXOL 350 MG/ML SOLN COMPARISON:  None. FINDINGS: CTA NECK FINDINGS Aortic arch: Atherosclerotic calcifications are present within the aortic arch and at the origins the great vessels without significant stenosis or change. There is no aneurysm. Right carotid system: Right common carotid artery is within normal limits. Bifurcation is somewhat obscured by patient motion. No significant stenosis is present. Mild tortuosity is present in the cervical right ICA without significant stenosis. Left carotid system: Left common carotid artery is tortuous. Atherosclerotic changes are present at the bifurcation without significant stenosis. There is mild tortuosity of the cervical left ICA without significant stenosis. Vertebral arteries: The left vertebral artery is the dominant vessel. Both vertebral arteries originate from the subclavian arteries without significant stenosis. There is no definite stenosis in the neck. Skeleton: Degenerative changes are present lower cervical spine. Osseous structures are somewhat distorted by patient motion. No acute abnormalities are present. Other neck: The soft tissues the neck are otherwise unremarkable. Upper chest: Centrilobular emphysematous changes are present. No  nodule or mass lesion is present. Thoracic inlet is normal. Review of the MIP images confirms the above findings CTA HEAD FINDINGS Anterior circulation: Atherosclerotic changes are present within the cavernous internal carotid arteries bilaterally without significant stenosis. Mild narrowing is present in the proximal right A1 segment. No emergent large vessel occlusion is present. There is some attenuation of distal small vessels. Posterior circulation: The left vertebral artery is the dominant vessel. No significant stenosis is present. There is mild narrowing the mid basilar artery. Both posterior cerebral arteries originate from basilar tip. Distal vessel attenuation is present without a significant proximal stenosis or occlusion. Venous sinuses: The dural sinuses patent. The straight sinus and deep cerebral veins are intact. Cortical veins are distorted by patient motion. Anatomic variants: None Review of the MIP images confirms the above findings IMPRESSION: 1. No emergent large vessel occlusion. 2. Atherosclerotic changes at the aortic arch, left carotid bifurcation, and cavernous internal carotid arteries bilaterally without significant stenosis. 3. Mild narrowing of the proximal right A1 segment. 4. Distal small vessel attenuation without a significant proximal stenosis, aneurysm, or branch vessel occlusion within the Circle of Willis. These results were called by telephone at the time of interpretation on 02/08/2020 at 10:05am to provider Dr. Lorraine Lax, Who verbally acknowledged these results. Electronically Signed: By: San Morelle M.D. On: 02/08/2020 10:34   MR BRAIN WO CONTRAST  Result Date: 02/10/2020 CLINICAL DATA:  Acute presentation with worsened level of consciousness and speech disturbance. EXAM: MRI HEAD WITHOUT CONTRAST TECHNIQUE: Multiplanar, multiecho pulse sequences of the brain and surrounding structures were obtained without intravenous contrast. COMPARISON:  CT studies 02/08/2020  FINDINGS: Brain: Diffusion imaging does not show any acute or subacute infarction. Chronic small-vessel ischemic changes affect the pons. No focal cerebellar insult. Cerebral hemispheres show generalized atrophy with mild chronic small-vessel change of the white matter considering age. No cortical or large vessel territory infarction. No mass lesion, hemorrhage, hydrocephalus or extra-axial collection. Vascular: Major vessels at the base of the brain show flow. Skull and upper cervical spine: Negative Sinuses/Orbits: Clear/normal Other: None IMPRESSION: No acute finding. Age related atrophy. Chronic small-vessel ischemic changes of the pons and cerebral hemispheric white matter, fairly mild considering age. Electronically Signed   By: Nelson Chimes M.D.   On: 02/10/2020 03:55   DG CHEST PORT 1 VIEW  Result Date: 02/08/2020  CLINICAL DATA:  COVID-19 positive, altered mental status EXAM: PORTABLE CHEST 1 VIEW COMPARISON:  Radiograph 10/04/2019 FINDINGS: Coarse interstitial changes are similar to priors. No consolidation, features of edema, pneumothorax, or effusion. Pulmonary vascularity is normally distributed. The aorta is calcified. The remaining cardiomediastinal contours are unremarkable. No acute osseous or soft tissue abnormality. No acute osseous or soft tissue abnormality. IMPRESSION: No acute cardiopulmonary findings. Coarse interstitial changes are similar to priors. Aortic Atherosclerosis (ICD10-I70.0). Electronically Signed   By: Lovena Le M.D.   On: 02/08/2020 19:47   DG Abd Portable 1V  Result Date: 02/09/2020 CLINICAL DATA:  Metal screening prior to MRI. EXAM: PORTABLE ABDOMEN - 1 VIEW COMPARISON:  CT abdomen and pelvis 08/21/2012 FINDINGS: No radiopaque foreign body is identified in the abdomen or pelvis with incomplete imaging of the inferior pelvic ring. No dilated loops of bowel are seen to suggest obstruction. Atherosclerotic vascular calcifications are noted. The visualized lung bases  are grossly clear. IMPRESSION: No metallic foreign body identified. Electronically Signed   By: Logan Bores M.D.   On: 02/09/2020 10:04   EEG adult  Result Date: 02/08/2020 Lora Havens, MD     02/08/2020  1:32 PM Patient Name: LA SHEHAN MRN: [id number] Epilepsy Attending: Lora Havens Referring Physician/Provider: Laurey Morale, NP Date: 02/08/2020 Duration: 36.20 minutes Patient history: 85 year old female with prior medical history of stroke who presented with unresponsiveness and right-sided weakness.  CT head negative for hemorrhage.  EEG evaluate for seizures. Level of alertness: Lethargic AEDs during EEG study: None Technical aspects: This EEG study was done with scalp electrodes positioned according to the 10-20 International system of electrode placement. Electrical activity was acquired at a sampling rate of 500Hz  and reviewed with a high frequency filter of 70Hz  and a low frequency filter of 1Hz . EEG data were recorded continuously and digitally stored. Description: EEG showed continuous generalized low amplitude 2 to 3 Hz delta slowing as well as 13 to 15 Hz frontocentral beta activity.  Hyperventilation and photic stimulation were not performed. Of note in the beginning, study was technically difficult due to significant myogenic artifact. Abnormality -Continuous slow, generalized IMPRESSION: This study is suggestive of moderate to severe diffuse encephalopathy, nonspecific etiology. No seizures or epileptiform discharges were seen throughout the recording. Lora Havens   CT HEAD CODE STROKE WO CONTRAST  Result Date: 02/08/2020 CLINICAL DATA:  Code stroke. Neuro deficit, acute, stroke suspected. EXAM: CT HEAD WITHOUT CONTRAST TECHNIQUE: Contiguous axial images were obtained from the base of the skull through the vertex without intravenous contrast. COMPARISON:  None. FINDINGS: Brain: No acute infarct, hemorrhage, or mass lesion is present. Moderate generalized atrophy and  white matter disease is stable. The ventricles are proportionate to the degree of atrophy. Basal ganglia and insular cortex is bilaterally. No acute or focal cortical abnormality is present. No significant extraaxial fluid collection is present. The brainstem and cerebellum are within normal limits. Vascular: Extensive vascular calcifications extend to the MCA bifurcations bilaterally without significant interval change. No other asymmetric hyperdense vessel is present. Dense calcifications in the basilar artery vertebral arteries are also stable. Skull: Insert normal skull No significant extracranial soft tissue lesion is present. Sinuses/Orbits: The paranasal sinuses and mastoid air cells are clear. Bilateral lens replacements are noted. Globes and orbits are otherwise unremarkable. Other: ASPECTS ([place] Stroke Program Early CT Score) - Ganglionic level infarction (caudate, lentiform nuclei, internal capsule, insula, M1-M3 cortex): 3/3 - Supraganglionic infarction (M4-M6 cortex): 7/7 Total score (0-10 with 10 being normal):  10/10 IMPRESSION: 1. No acute intracranial abnormality or significant interval change. 2. Stable advanced atrophy and diffuse white matter disease. 3. Advanced calcific atherosclerotic changes extend to the MCA bifurcations and the basilar artery are stable. 4. ASPECTS is 10/10 1. The above was relayed via text pager to Dr. Lorraine Lax on 02/08/2020 at 10:03 . Electronically Signed   By: San Morelle M.D.   On: 02/08/2020 10:04   VAS [place] LOWER EXTREMITY VENOUS (DVT)  Result Date: 02/11/2020  Lower Venous DVTStudy Indications: Elevated D-Dimer, Covid+.  Risk Factors: Surgery Right below knee amputation. Limitations: Dementia, uncooperative. Comparison Study: No prior exams. Performing Technologist: Baldwin Crown RDMS, RVT  Examination Guidelines: A complete evaluation includes B-mode imaging, spectral Doppler, color Doppler, and power Doppler as needed of all accessible portions of each  vessel. Bilateral testing is considered an integral part of a complete examination. Limited examinations for reoccurring indications may be performed as noted. The reflux portion of the exam is performed with the patient in reverse Trendelenburg.  +---------+---------------+---------+-----------+----------+--------------+ RIGHT    CompressibilityPhasicitySpontaneityPropertiesThrombus Aging +---------+---------------+---------+-----------+----------+--------------+ CFV      Full           Yes      Yes                                 +---------+---------------+---------+-----------+----------+--------------+ SFJ      Full                                                        +---------+---------------+---------+-----------+----------+--------------+ FV Prox  Full                                                        +---------+---------------+---------+-----------+----------+--------------+ FV Mid   Full                                                        +---------+---------------+---------+-----------+----------+--------------+ FV DistalFull                                                        +---------+---------------+---------+-----------+----------+--------------+ PFV      Full                                                        +---------+---------------+---------+-----------+----------+--------------+ POP      Full           Yes      Yes                                 +---------+---------------+---------+-----------+----------+--------------+   +---------+---------------+---------+-----------+----------+--------------+  LEFT     CompressibilityPhasicitySpontaneityPropertiesThrombus Aging +---------+---------------+---------+-----------+----------+--------------+ CFV      Full           Yes      Yes                                 +---------+---------------+---------+-----------+----------+--------------+ SFJ      Full                                                         +---------+---------------+---------+-----------+----------+--------------+ FV Prox  Full                                                        +---------+---------------+---------+-----------+----------+--------------+ FV Mid   Full                                                        +---------+---------------+---------+-----------+----------+--------------+ FV DistalFull                                                        +---------+---------------+---------+-----------+----------+--------------+ PFV      Full                                                        +---------+---------------+---------+-----------+----------+--------------+ POP      Full           Yes      Yes                                 +---------+---------------+---------+-----------+----------+--------------+ PTV      Full                                                        +---------+---------------+---------+-----------+----------+--------------+ PERO     Full                                                        +---------+---------------+---------+-----------+----------+--------------+     Summary: BILATERAL: - No evidence of deep vein thrombosis seen in the lower extremities, bilaterally.  RIGHT: - No cystic structure found in the popliteal fossa. - Right below knee amputation.  LEFT: - No cystic structure found in the popliteal fossa.  *  See table(s) above for measurements and observations. Electronically signed by Ruta Hinds MD on 02/11/2020 at 5:17:49 PM.    Final    ECHOCARDIOGRAM LIMITED  Result Date: 02/09/2020    ECHOCARDIOGRAM LIMITED REPORT   Patient Name:   CHRISTI WIRICK Date of Exam: 02/09/2020 Medical Rec #:  [id number]        Height:       65.0 in Accession #:    [id number]       Weight:       132.3 lb Date of Birth:  01-Jun-1935        BSA:          1.66 m Patient Age:    64 years         BP:           209/93  mmHg Patient Gender: F                HR:           94 bpm. Exam Location:  Inpatient Procedure: Limited Echo and Color Doppler Indications:    CVA  History:        Patient has prior history of Echocardiogram examinations, most                 recent 02/18/2017. Stroke and PAD; Risk Factors:Hypertension,                 Diabetes and Dyslipidemia.  Sonographer:    Dustin Flock Referring Phys: [place] Comments: Image acquisition challenging due to uncooperative patient and cant follow commands. IMPRESSIONS  1. Left ventricular ejection fraction, by estimation, is 60 to 65%. The left ventricle has normal function. The left ventricle has no regional wall motion abnormalities. Left ventricular diastolic function could not be evaluated.  2. Right ventricular systolic function is normal. The right ventricular size is normal.  3. The mitral valve is normal in structure and function. No evidence of mitral valve regurgitation. No evidence of mitral stenosis.  4. The aortic valve is normal in structure and function. Aortic valve regurgitation is not visualized. Mild aortic valve sclerosis is present, with no evidence of aortic valve stenosis.  5. The inferior vena cava is normal in size with greater than 50% respiratory variability, suggesting right atrial pressure of 3 mmHg. Conclusion(s)/Recomendation(s): Limited study due to poor patient cooperation. FINDINGS  Left Ventricle: Left ventricular ejection fraction, by estimation, is 60 to 65%. The left ventricle has normal function. The left ventricle has no regional wall motion abnormalities. The left ventricular internal cavity size was normal in size. There is  no left ventricular hypertrophy. The left ventricular diastology could not be evaluated due to indeterminate diastolic function. Right Ventricle: The right ventricular size is normal. No increase in right ventricular wall thickness. Right ventricular systolic function is normal. Left  Atrium: Left atrial size was normal in size. Right Atrium: Right atrial size was normal in size. Pericardium: There is no evidence of pericardial effusion. Mitral Valve: The mitral valve is normal in structure and function. Normal mobility of the mitral valve leaflets. No evidence of mitral valve stenosis. Tricuspid Valve: The tricuspid valve is normal in structure. Tricuspid valve regurgitation is not demonstrated. No evidence of tricuspid stenosis. Aortic Valve: The aortic valve is normal in structure and function. Aortic valve regurgitation is not visualized. Mild aortic valve sclerosis is present, with no evidence of aortic valve stenosis. Pulmonic Valve: The pulmonic valve was normal in structure.  Pulmonic valve regurgitation is not visualized. No evidence of pulmonic stenosis. Aorta: The aortic root is normal in size and structure. Venous: The inferior vena cava is normal in size with greater than 50% respiratory variability, suggesting right atrial pressure of 3 mmHg. IAS/Shunts: No atrial level shunt detected by color flow Doppler.  LEFT VENTRICLE PLAX 2D LVIDd:         3.27 cm Diastology LVIDs:         2.15 cm LV e' lateral: 6.31 cm/s LV PW:         1.07 cm LV e' medial:  3.48 cm/s LV IVS:        1.24 cm  Dani Gobble Croitoru MD Electronically signed by Sanda Klein MD Signature Date/Time: 02/09/2020/12:54:09 PM    Final      PERTINENT LAB RESULTS: CBC: Recent Labs    02/12/20 0332 02/13/20 0504  WBC 6.2 6.2  HGB 11.1* 10.1*  HCT 32.0* 30.0*  PLT 233 220   CMET CMP     Component Value Date/Time   NA 134 (L) 02/14/2020 0714   NA 128 (L) 10/22/2019 1056   K 3.9 02/14/2020 0714   CL 104 02/14/2020 0714   CO2 21 (L) 02/14/2020 0714   GLUCOSE 209 (H) 02/14/2020 0714   BUN 25 (H) 02/14/2020 0714   BUN 17 10/22/2019 1056   CREATININE 1.70 (H) 02/14/2020 0714   CREATININE 1.16 (H) 11/07/2019 1439   CALCIUM 9.1 02/14/2020 0714   PROT 5.0 (L) 02/13/2020 0504   PROT 7.1 04/14/2016 0922    ALBUMIN 2.2 (L) 02/13/2020 0504   ALBUMIN 4.1 04/14/2016 0922   AST 20 02/13/2020 0504   ALT 14 02/13/2020 0504   ALKPHOS 54 02/13/2020 0504   BILITOT 0.7 02/13/2020 0504   BILITOT 0.4 04/14/2016 0922   GFRNONAA 28 (L) 02/14/2020 0714   GFRNONAA 44 (L) 11/07/2019 1439   GFRAA 32 (L) 02/14/2020 0714   GFRAA 51 (L) 11/07/2019 1439    GFR Estimated Creatinine Clearance: 23 mL/min (A) (by C-G formula based on SCr of 1.7 mg/dL (H)). No results for input(s): LIPASE, AMYLASE in the last 72 hours. No results for input(s): CKTOTAL, CKMB, CKMBINDEX, TROPONINI in the last 72 hours. Invalid input(s): POCBNP Recent Labs    02/12/20 0332 02/13/20 0504  DDIMER 3.29* 3.60*   No results for input(s): HGBA1C in the last 72 hours. No results for input(s): CHOL, HDL, LDLCALC, TRIG, CHOLHDL, LDLDIRECT in the last 72 hours. No results for input(s): TSH, T4TOTAL, T3FREE, THYROIDAB in the last 72 hours.  Invalid input(s): FREET3 Recent Labs    02/12/20 0332  FERRITIN 759*   Coags: No results for input(s): INR in the last 72 hours.  Invalid input(s): PT Microbiology: Recent Results (from the past 240 hour(s))  Respiratory Panel by RT PCR (Flu A&B, Covid) - Nasopharyngeal Swab     Status: Abnormal   Collection Time: 02/08/20  9:45 AM   Specimen: Nasopharyngeal Swab  Result Value Ref Range Status   SARS Coronavirus 2 by RT PCR POSITIVE (A) NEGATIVE Final    Comment: RESULT CALLED TO, READ BACK BY AND VERIFIED WITH: A. Florene Glen RN 11:50 02/08/20 ([email or web address]) (NOTE) SARS-CoV-2 target nucleic acids are DETECTED. SARS-CoV-2 RNA is generally detectable in upper respiratory specimens  during the acute phase of infection. Positive results are indicative of the presence of the identified virus, but do not rule out bacterial infection or co-infection with other pathogens not detected by the test. Clinical correlation with patient history and  other diagnostic information is necessary to determine  patient infection status. The expected result is Negative. Fact Sheet for Patients:  PinkCheek.be Fact Sheet for Healthcare Providers: GravelBags.it This test is not yet approved or cleared by the [place] FDA and  has been authorized for detection and/or diagnosis of SARS-CoV-2 by FDA under an Emergency Use Authorization (EUA).  This EUA will remain in effect (meaning this test can be used)  for the duration of  the COVID-19 declaration under Section 564(b)(1) of the Act, 21 U.S.C. section 360bbb-3(b)(1), unless the authorization is terminated or revoked sooner.    Influenza A by PCR NEGATIVE NEGATIVE Final   Influenza B by PCR NEGATIVE NEGATIVE Final    Comment: (NOTE) The Xpert Xpress SARS-CoV-2/FLU/RSV assay is intended as an aid in  the diagnosis of influenza from Nasopharyngeal swab specimens and  should not be used as a sole basis for treatment. Nasal washings and  aspirates are unacceptable for Xpert Xpress SARS-CoV-2/FLU/RSV  testing. Fact Sheet for Patients: PinkCheek.be Fact Sheet for Healthcare Providers: GravelBags.it This test is not yet approved or cleared by the [place] FDA and  has been authorized for detection and/or diagnosis of SARS-CoV-2 by  FDA under an Emergency Use Authorization (EUA). This EUA will remain  in effect (meaning this test can be used) for the duration of the  Covid-19 declaration under Section 564(b)(1) of the Act, 21  U.S.C. section 360bbb-3(b)(1), unless the authorization is  terminated or revoked. Performed at [facility], [place] [address] [place], [place] [address]     FURTHER DISCHARGE INSTRUCTIONS:  Get Medicines reviewed and adjusted: Please take all your medications with you for your next visit with your Primary MD  Laboratory/radiological data: Please request your Primary MD to go over all hospital  tests and procedure/radiological results at the follow up, please ask your Primary MD to get all Hospital records sent to his/her office.  In some cases, they will be blood work, cultures and biopsy results pending at the time of your discharge. Please request that your primary care M.D. goes through all the records of your hospital data and follows up on these results.  Also Note the following: If you experience worsening of your admission symptoms, develop shortness of breath, life threatening emergency, suicidal or homicidal thoughts you must seek medical attention immediately by calling 911 or calling your MD immediately  if symptoms less severe.  You must read complete instructions/literature along with all the possible adverse reactions/side effects for all the Medicines you take and that have been prescribed to you. Take any new Medicines after you have completely understood and accpet all the possible adverse reactions/side effects.   Do not drive when taking Pain medications or sleeping medications (Benzodaizepines)  Do not take more than prescribed Pain, Sleep and Anxiety Medications. It is not advisable to combine anxiety,sleep and pain medications without talking with your primary care practitioner  Special Instructions: If you have smoked or chewed Tobacco  in the last 2 yrs please stop smoking, stop any regular Alcohol  and or any Recreational drug use.  Wear Seat belts while driving.  Please note: You were cared for by a hospitalist during your hospital stay. Once you are discharged, your primary care physician will handle any further medical issues. Please note that NO REFILLS for any discharge medications will be authorized once you are discharged, as it is imperative that you return to your primary care physician (or establish a relationship with a primary  care physician if you do not have one) for your post hospital discharge needs so that they can reassess your need for  medications and monitor your lab values.  Total Time spent coordinating discharge including counseling, education and face to face time equals 45 minutes.  SignedOren Binet 02/14/2020 11:35 AM

## 2020-02-14 NOTE — Progress Notes (Signed)
Report given to PTAR on arrival and all questions asked. Belongings sent with patient.
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
## 2020-02-14 NOTE — Progress Notes (Signed)
OT Treatment  Clinical Impression: Pt progressing towards OT goals. Guided pt in ADLs this AM. Pt Supervision for bed mobility to sit EOB. Pt overall setup for UB bathing/dressing to don hospital gown sitting EOB. Pt Supervision for LB bathing leaning side to side for peri care. Pt Setup to don sock sitting EOB, min guard to don brief standing EOB with R residual limb EOB. Guided pt in bed to recliner transfer without AD at [place] with pt hopping toward recliner chair. Pt with improved ability to attend to tasks, decreased impulsivity, and improved ability to follow directions this session. DC plan remains appropriate.     02/14/20 1000  OT Visit Information  Last OT Received On 02/14/20  Assistance Needed +1  OT goals addressed during session ADL's and self-care  History of Present Illness 85 yo female adm to [facility] with AMS, differential is COVID encephalopathy, CVA or seizure.  Pt PMH significant for stroke, DM2, Diabetic retinopathy, PAD, chronic hep B, herpetic neuralgia, gait abnormality, left gangrene s/p BKA in 10/2019.  MRI ordered for when able to conduct MRI w/ + COVID.  CT head negative for acute event.  Precautions  Precautions Fall;Other (comment)  Pain Assessment  Pain Assessment No/denies pain  Cognition  Arousal/Alertness Awake/alert  Behavior During Therapy Restless;WFL for tasks assessed/performed  Overall Cognitive Status No family/caregiver present to determine baseline cognitive functioning  Area of Impairment Attention;Following commands;Orientation  Orientation Level Disoriented to;Situation;Place  Current Attention Level Selective  Following Commands Follows one step commands inconsistently;Follows multi-step commands inconsistently  Upper Extremity Assessment  Upper Extremity Assessment Generalized weakness  ADL  Overall ADL's  Needs assistance/impaired  Grooming Wash/dry face;Set up  Upper Body Bathing Set up;Sitting  Lower Body Bathing Supervison/  safety;Sitting/lateral leans  Lower Body Bathing Details (indicate cue type and reason) Pt Supervision for safety leaning side to side sitting EOB for LB bathing  Upper Body Dressing  Set up;Sitting  Upper Body Dressing Details (indicate cue type and reason) Setup to don hospital gown  Lower Body Dressing Min guard;Sit to/from stand  Lower Body Dressing Details (indicate cue type and reason) Pt Setup to don L sock sitting EOB. Pt min guard in standing to don brief with pt resting R residual limb EOB for stability  General ADL Comments Pt with improved attention, safety, and ADL independence this session  Bed Mobility  Overal bed mobility Needs Assistance  Bed Mobility Supine to Sit  Supine to sit Supervision  Restrictions  Weight Bearing Restrictions No  Transfers  Overall transfer level Needs assistance  Equipment used None  Transfers Sit to/from Stand;Stand Pivot Transfers  Sit to Stand Min guard  Stand pivot transfers Mod assist  General transfer comment Mod A for safety with pt hopping to recliner chair  OT - End of Session  Equipment Utilized During Treatment Gait belt  Activity Tolerance Patient tolerated treatment well  Patient left in chair;with call bell/phone within reach;with bed alarm set;Other (comment)  Nurse Communication Mobility status;Other (comment) (left in chair with alarm on)  OT Assessment/Plan  OT Plan Discharge plan remains appropriate  OT Visit Diagnosis Muscle weakness (generalized) (M62.81);Other symptoms and signs involving cognitive function;Other symptoms and signs involving the nervous system (R29.898)  OT Frequency (ACUTE ONLY) Min 2X/week  Follow Up Recommendations SNF;Supervision/Assistance - 24 hour  OT Equipment Other (comment)  AM-PAC OT "6 Clicks" Daily Activity Outcome Measure (Version 2)  Help from another person eating meals? 4  Help from another person taking care of personal  grooming? 3  Help from another person toileting, which includes  using toliet, bedpan, or urinal? 2  Help from another person bathing (including washing, rinsing, drying)? 3  Help from another person to put on and taking off regular upper body clothing? 3  Help from another person to put on and taking off regular lower body clothing? 3  6 Click Score 18  OT Goal Progression  Progress towards OT goals Progressing toward goals  Acute Rehab OT Goals  Patient Stated Goal go home  OT Goal Formulation With patient  Time For Goal Achievement 02/23/20  Potential to Achieve Goals Good  ADL Goals  Pt Will Perform Eating sitting;Independently  Pt Will Perform Grooming with modified independence;standing  Pt Will Perform Upper Body Dressing with supervision;sitting  Additional ADL Goal #1 Pt will follow 1 step stimple commands with 50% accuracy during functional task.  Additional ADL Goal #2 Pt will sustain attention to ADL task >5 min with no more than min cues.  Additional ADL Goal #3 Pt will perform bed mobility with minA as precursor to EOB/OOB ADL.  OT Time Calculation  OT Start Time (ACUTE ONLY) 1000  OT Stop Time (ACUTE ONLY) 1026  OT Time Calculation (min) 26 min  OT General Charges  $OT Visit 1 Visit  OT Treatments  $Self Care/Home Management  23-37 mins

## 2020-02-14 NOTE — Care Management Important Message (Signed)
Important Message  Patient Details  Name: Monique Cabrera MRN: [id number] Date of Birth: Oct 26, 1935   Medicare Important Message Given:  Yes - Important Message mailed due to current National Emergency  Verbal consent obtained due to current National Emergency  Relationship to patient: Child Contact Name: Doyle Askew Call Date: 02/14/20  Time: 1216 Phone: [phone] Outcome: No Answer/Busy Important Message mailed to: Patient address on file    Delorse Lek 02/14/2020, 12:16 PM

## 2020-02-14 NOTE — Progress Notes (Signed)
Report called to [facility] place and spoke with receiving RN, all questions answered.

## 2020-02-14 NOTE — TOC Transition Note (Addendum)
Transition of Care [facility]) - CM/SW Discharge Note   Patient Details  Name: Monique Cabrera MRN: [id number] Date of Birth: [birth year]  [facility] CM/SW Contact:  Benard Halsted, LCSW Phone Number: 02/14/2020, 12:14 PM   Clinical Narrative:    [facility] will [facility] to: [facility] Anticipated DC date: 02/14/20 Family notified: Son, [name] Transport by: Corey Harold   Per MD patient ready for DC to [facility]. RN, patient, patient's family, and facility notified of DC. Discharge Summary and FL2 sent to facility. RN to call report prior to discharge ([place]). DC packet on chart. Ambulance transport requested for patient.   CSW will sign off for now as social work intervention is no longer needed. Please consult [place] again if new needs arise.  Cedric Fishman, LCSW Clinical Social Worker [phone]    Final next level of care: Skilled Nursing Facility Barriers to Discharge: No Barriers Identified   Patient Goals and CMS Choice Patient states their goals for this hospitalization and ongoing recovery are:: Get better enough to walk around. CMS Medicare.gov Compare Post Acute Care list provided to:: Other (Comment Required)(Son, [name]) Choice offered to / list presented to : Adult Children  Discharge Placement   Existing PASRR number confirmed : 02/14/20          Patient [facility] bed at: [facility] Patient to be transferred to facility by: [place] Name of family member notified: Son, [name] Patient and family notified of of transfer: 02/14/20  Discharge Plan and Services In-house Referral: Clinical Social Work Discharge Planning Services: CM Consult Post Acute Care Choice: [place]                               Social Determinants of Health (SDOH) Interventions     Readmission Risk Interventions Readmission Risk Prevention Plan 11/02/2019  Transportation Screening Complete  PCP or Specialist Appt within 3-5 Days Complete  HRI or [place] Complete  Social Work Consult for [place] Planning/Counseling Complete  Palliative Care Screening Not Applicable  Medication Review Press [employer]) Referral to Pharmacy  Some recent data might be hidden

## 2020-02-15 ENCOUNTER — Ambulatory Visit: Payer: Medicare Other | Admitting: Rehabilitation

## 2020-02-18 DIAGNOSIS — U071 COVID-19: Secondary | ICD-10-CM | POA: Diagnosis not present

## 2020-02-18 DIAGNOSIS — R569 Unspecified convulsions: Secondary | ICD-10-CM | POA: Diagnosis not present

## 2020-02-18 DIAGNOSIS — I10 Essential (primary) hypertension: Secondary | ICD-10-CM | POA: Diagnosis not present

## 2020-02-20 ENCOUNTER — Ambulatory Visit: Payer: Medicare Other | Admitting: Podiatry

## 2020-02-23 DIAGNOSIS — N179 Acute kidney failure, unspecified: Secondary | ICD-10-CM | POA: Diagnosis not present

## 2020-02-23 DIAGNOSIS — E1121 Type 2 diabetes mellitus with diabetic nephropathy: Secondary | ICD-10-CM | POA: Diagnosis not present

## 2020-02-23 DIAGNOSIS — N1832 Chronic kidney disease, stage 3b: Secondary | ICD-10-CM | POA: Diagnosis not present

## 2020-02-23 DIAGNOSIS — G9341 Metabolic encephalopathy: Secondary | ICD-10-CM | POA: Diagnosis not present

## 2020-02-27 DIAGNOSIS — R569 Unspecified convulsions: Secondary | ICD-10-CM | POA: Diagnosis not present

## 2020-02-27 DIAGNOSIS — D649 Anemia, unspecified: Secondary | ICD-10-CM | POA: Diagnosis not present

## 2020-02-28 ENCOUNTER — Telehealth: Payer: Self-pay

## 2020-02-28 NOTE — Telephone Encounter (Signed)
Patient is being discharged from [facility] on 02/29/20.  [place] called to schedule a rehab followup appointment.  I scheduled her an appointment but then realized there was not enough time to see the next scheduled patient and I cancelled that appointment.  I called [employer] and left a message for [place] to please return call so that I could reschedule patient.

## 2020-02-28 NOTE — Telephone Encounter (Signed)
Napoleon from [place] returned call and appointment was rescheduled.

## 2020-03-03 ENCOUNTER — Telehealth: Payer: Self-pay | Admitting: *Deleted

## 2020-03-03 ENCOUNTER — Ambulatory Visit: Payer: Self-pay

## 2020-03-03 DIAGNOSIS — I639 Cerebral infarction, unspecified: Secondary | ICD-10-CM | POA: Diagnosis not present

## 2020-03-03 DIAGNOSIS — I96 Gangrene, not elsewhere classified: Secondary | ICD-10-CM | POA: Diagnosis not present

## 2020-03-03 NOTE — Telephone Encounter (Signed)
Information given to patient.   People 80 and older can get a COVID-19 vaccination from [facility].   [facility] in [place] by appointment only. This will be for anyone 51 and older. People can be vaccinated at this time only if they register in advance. That can be done online at [email or web address] or by calling [phone]. You do not have to be a resident of [facility] to be vaccinated.  The vaccinations will move to the [facility] eventually due to a need for more space.    The [facility] [employer]) is pleased to announce that COVID-19 vaccinations will be available to Phase 1B adults who are 70 years or older regardless of health status or living situation. Those in this group can make a vaccination appointment by calling [phone] and selecting Option 2 beginning Friday, January 8 at 8:00 AM. Appointments are required.

## 2020-03-04 ENCOUNTER — Ambulatory Visit: Payer: Medicare Other | Admitting: Cardiovascular Disease

## 2020-03-04 DIAGNOSIS — D631 Anemia in chronic kidney disease: Secondary | ICD-10-CM | POA: Diagnosis not present

## 2020-03-04 DIAGNOSIS — B0229 Other postherpetic nervous system involvement: Secondary | ICD-10-CM | POA: Diagnosis not present

## 2020-03-04 DIAGNOSIS — Z8673 Personal history of transient ischemic attack (TIA), and cerebral infarction without residual deficits: Secondary | ICD-10-CM | POA: Diagnosis not present

## 2020-03-04 DIAGNOSIS — E1151 Type 2 diabetes mellitus with diabetic peripheral angiopathy without gangrene: Secondary | ICD-10-CM | POA: Diagnosis not present

## 2020-03-04 DIAGNOSIS — E11649 Type 2 diabetes mellitus with hypoglycemia without coma: Secondary | ICD-10-CM | POA: Diagnosis not present

## 2020-03-04 DIAGNOSIS — N182 Chronic kidney disease, stage 2 (mild): Secondary | ICD-10-CM | POA: Diagnosis not present

## 2020-03-04 DIAGNOSIS — Z794 Long term (current) use of insulin: Secondary | ICD-10-CM | POA: Diagnosis not present

## 2020-03-04 DIAGNOSIS — I129 Hypertensive chronic kidney disease with stage 1 through stage 4 chronic kidney disease, or unspecified chronic kidney disease: Secondary | ICD-10-CM | POA: Diagnosis not present

## 2020-03-04 DIAGNOSIS — E1122 Type 2 diabetes mellitus with diabetic chronic kidney disease: Secondary | ICD-10-CM | POA: Diagnosis not present

## 2020-03-04 DIAGNOSIS — Z9181 History of falling: Secondary | ICD-10-CM | POA: Diagnosis not present

## 2020-03-04 DIAGNOSIS — Z8616 Personal history of COVID-19: Secondary | ICD-10-CM | POA: Diagnosis not present

## 2020-03-04 DIAGNOSIS — Z89511 Acquired absence of right leg below knee: Secondary | ICD-10-CM | POA: Diagnosis not present

## 2020-03-04 DIAGNOSIS — Z993 Dependence on wheelchair: Secondary | ICD-10-CM | POA: Diagnosis not present

## 2020-03-04 DIAGNOSIS — Z7902 Long term (current) use of antithrombotics/antiplatelets: Secondary | ICD-10-CM | POA: Diagnosis not present

## 2020-03-04 DIAGNOSIS — R569 Unspecified convulsions: Secondary | ICD-10-CM | POA: Diagnosis not present

## 2020-03-04 DIAGNOSIS — Z7982 Long term (current) use of aspirin: Secondary | ICD-10-CM | POA: Diagnosis not present

## 2020-03-05 ENCOUNTER — Other Ambulatory Visit: Payer: Self-pay

## 2020-03-05 ENCOUNTER — Telehealth: Payer: Self-pay | Admitting: *Deleted

## 2020-03-05 ENCOUNTER — Ambulatory Visit (INDEPENDENT_AMBULATORY_CARE_PROVIDER_SITE_OTHER): Payer: Medicare Other | Admitting: Podiatry

## 2020-03-05 ENCOUNTER — Ambulatory Visit (INDEPENDENT_AMBULATORY_CARE_PROVIDER_SITE_OTHER): Payer: Medicare Other

## 2020-03-05 VITALS — Temp 97.3°F

## 2020-03-05 DIAGNOSIS — E1122 Type 2 diabetes mellitus with diabetic chronic kidney disease: Secondary | ICD-10-CM | POA: Diagnosis not present

## 2020-03-05 DIAGNOSIS — E1151 Type 2 diabetes mellitus with diabetic peripheral angiopathy without gangrene: Secondary | ICD-10-CM | POA: Diagnosis not present

## 2020-03-05 DIAGNOSIS — Z993 Dependence on wheelchair: Secondary | ICD-10-CM | POA: Diagnosis not present

## 2020-03-05 DIAGNOSIS — D631 Anemia in chronic kidney disease: Secondary | ICD-10-CM | POA: Diagnosis not present

## 2020-03-05 DIAGNOSIS — Z8616 Personal history of COVID-19: Secondary | ICD-10-CM | POA: Diagnosis not present

## 2020-03-05 DIAGNOSIS — Z9181 History of falling: Secondary | ICD-10-CM

## 2020-03-05 DIAGNOSIS — L02612 Cutaneous abscess of left foot: Secondary | ICD-10-CM | POA: Diagnosis not present

## 2020-03-05 DIAGNOSIS — B0229 Other postherpetic nervous system involvement: Secondary | ICD-10-CM | POA: Diagnosis not present

## 2020-03-05 DIAGNOSIS — R600 Localized edema: Secondary | ICD-10-CM | POA: Diagnosis not present

## 2020-03-05 DIAGNOSIS — B351 Tinea unguium: Secondary | ICD-10-CM

## 2020-03-05 DIAGNOSIS — L98499 Non-pressure chronic ulcer of skin of other sites with unspecified severity: Secondary | ICD-10-CM | POA: Diagnosis not present

## 2020-03-05 DIAGNOSIS — R569 Unspecified convulsions: Secondary | ICD-10-CM | POA: Diagnosis not present

## 2020-03-05 DIAGNOSIS — I129 Hypertensive chronic kidney disease with stage 1 through stage 4 chronic kidney disease, or unspecified chronic kidney disease: Secondary | ICD-10-CM | POA: Diagnosis not present

## 2020-03-05 DIAGNOSIS — L03032 Cellulitis of left toe: Secondary | ICD-10-CM

## 2020-03-05 DIAGNOSIS — Z8673 Personal history of transient ischemic attack (TIA), and cerebral infarction without residual deficits: Secondary | ICD-10-CM | POA: Diagnosis not present

## 2020-03-05 DIAGNOSIS — Z89511 Acquired absence of right leg below knee: Secondary | ICD-10-CM | POA: Diagnosis not present

## 2020-03-05 DIAGNOSIS — E11649 Type 2 diabetes mellitus with hypoglycemia without coma: Secondary | ICD-10-CM | POA: Diagnosis not present

## 2020-03-05 DIAGNOSIS — Z7902 Long term (current) use of antithrombotics/antiplatelets: Secondary | ICD-10-CM | POA: Diagnosis not present

## 2020-03-05 DIAGNOSIS — Z7982 Long term (current) use of aspirin: Secondary | ICD-10-CM | POA: Diagnosis not present

## 2020-03-05 DIAGNOSIS — N182 Chronic kidney disease, stage 2 (mild): Secondary | ICD-10-CM | POA: Diagnosis not present

## 2020-03-05 DIAGNOSIS — Z794 Long term (current) use of insulin: Secondary | ICD-10-CM | POA: Diagnosis not present

## 2020-03-05 MED ORDER — MUPIROCIN 2 % EX OINT: TOPICAL_OINTMENT | CUTANEOUS | 1 refills | Status: DC

## 2020-03-05 MED ORDER — CEPHALEXIN 500 MG PO CAPS: 500.0000 mg | ORAL_CAPSULE | Freq: Four times a day (QID) | ORAL | 0 refills | Status: AC

## 2020-03-05 NOTE — Telephone Encounter (Signed)
Agree with continued OT as requested.

## 2020-03-05 NOTE — Telephone Encounter (Signed)
Monique Cabrera with [facility] called and stated that she completed patient's OT Evaluation and needs verbal for 1x1week and 2x3weeks.   Patient was just discharged from [facility] and has an appointment tomorrow 3/11 with Dr. Mariea Clonts to Follow up.   Is Verbal orders ok to give. Please Advise.

## 2020-03-05 NOTE — Telephone Encounter (Signed)
[place] Notified.

## 2020-03-05 NOTE — Patient Instructions (Addendum)
DRESSING CHANGES LEFT SECOND TOE:     1. KEEP LEFT  FOOT DRY AT ALL TIMES!!!!  2. CLEANSE ULCER WITH SALINE.  3. DAB DRY WITH GAUZE SPONGE.  4. APPLY A LIGHT AMOUNT OF MUPIROCIN TO BASE OF ULCER.  5. APPLY OUTER DRESSING/BAND-AID AS INSTRUCTED.  6. WEAR SURGICAL SHOE DAILY AT ALL TIMES.  7. DO NOT WALK BAREFOOT!!!  8.  IF YOU EXPERIENCE ANY FEVER, CHILLS, NIGHTSWEATS, NAUSEA OR VOMITING, ELEVATED OR LOW BLOOD SUGARS, REPORT TO EMERGENCY ROOM.  9. IF YOU EXPERIENCE INCREASED REDNESS, PAIN, SWELLING, DISCOLORATION, ODOR, PUS, DRAINAGE OR WARMTH OF YOUR FOOT, REPORT TO EMERGENCY ROOM.

## 2020-03-06 ENCOUNTER — Encounter: Payer: Self-pay | Admitting: Podiatry

## 2020-03-06 ENCOUNTER — Telehealth: Payer: Self-pay | Admitting: *Deleted

## 2020-03-06 ENCOUNTER — Ambulatory Visit: Payer: Self-pay | Admitting: Internal Medicine

## 2020-03-06 ENCOUNTER — Ambulatory Visit (INDEPENDENT_AMBULATORY_CARE_PROVIDER_SITE_OTHER): Payer: Medicare Other | Admitting: Internal Medicine

## 2020-03-06 ENCOUNTER — Encounter: Payer: Self-pay | Admitting: Internal Medicine

## 2020-03-06 VITALS — BP 122/62 | HR 70 | Temp 97.5°F | Ht 65.0 in | Wt 123.0 lb

## 2020-03-06 DIAGNOSIS — E11649 Type 2 diabetes mellitus with hypoglycemia without coma: Secondary | ICD-10-CM

## 2020-03-06 DIAGNOSIS — N182 Chronic kidney disease, stage 2 (mild): Secondary | ICD-10-CM | POA: Diagnosis not present

## 2020-03-06 DIAGNOSIS — Z89511 Acquired absence of right leg below knee: Secondary | ICD-10-CM

## 2020-03-06 DIAGNOSIS — D539 Nutritional anemia, unspecified: Secondary | ICD-10-CM | POA: Diagnosis not present

## 2020-03-06 DIAGNOSIS — R569 Unspecified convulsions: Secondary | ICD-10-CM

## 2020-03-06 DIAGNOSIS — E1151 Type 2 diabetes mellitus with diabetic peripheral angiopathy without gangrene: Secondary | ICD-10-CM

## 2020-03-06 DIAGNOSIS — E1129 Type 2 diabetes mellitus with other diabetic kidney complication: Secondary | ICD-10-CM

## 2020-03-06 DIAGNOSIS — D7589 Other specified diseases of blood and blood-forming organs: Secondary | ICD-10-CM | POA: Diagnosis not present

## 2020-03-06 DIAGNOSIS — I739 Peripheral vascular disease, unspecified: Secondary | ICD-10-CM

## 2020-03-06 DIAGNOSIS — L02612 Cutaneous abscess of left foot: Secondary | ICD-10-CM

## 2020-03-06 DIAGNOSIS — E1165 Type 2 diabetes mellitus with hyperglycemia: Secondary | ICD-10-CM

## 2020-03-06 DIAGNOSIS — R600 Localized edema: Secondary | ICD-10-CM

## 2020-03-06 DIAGNOSIS — IMO0002 Reserved for concepts with insufficient information to code with codable children: Secondary | ICD-10-CM

## 2020-03-06 NOTE — Telephone Encounter (Signed)
Tim Lair, [facility] called requesting verbal orders for the delay of Start of Care for 03/04/20. They have seen patient but there was a delay in Nursing.  Verbal order given.

## 2020-03-06 NOTE — Telephone Encounter (Addendum)
Dr. Elisha Ponder ordered referral to [facility] for left foot evaluation. Required form, clinicals and demographics faxed to [facility].

## 2020-03-06 NOTE — Progress Notes (Addendum)
Subjective: Monique Cabrera presents today for at-risk foot care with h/o diabetes and s/p BKA of RLE.    New concern today: She states she was hospitalized last month after her grandson found her confused on the floor in her home. She was admitted to [facility] with COVID-19 and altered mental status.  She was discharged from [facility] and sent to Rehab. Patient states she noticed her left foot changing color on last Wednesday while in Rehab facility. She was discharged home on Sunday, March 7th. Today, she relates pain in her left 2nd digit.   She denies any fever, chills, night sweats, nausea or vomiting.She denies any rest pain,  leg pain or foot pain. Pain is localized to 2nd digit. She is still able to bear weight on her limb.     [place], Monique Cabrera, Monique Cabrera is her PCP.   Current Outpatient Medications on File Prior to Visit  Medication Sig Dispense Refill  . amLODipine (NORVASC) 10 MG tablet TAKE 1 TABLET (10 MG TOTAL) BY MOUTH DAILY. FOR HIGH BLOOD PRESSURE 90 tablet 1  . aspirin 81 MG chewable tablet Chew 1 tablet (81 mg total) by mouth daily. 90 tablet 3  . atorvastatin (LIPITOR) 20 MG tablet TAKE 1 TABLET BY MOUTH EVERY DAY (Patient taking differently: Take 20 mg by mouth daily. ) 90 tablet 1  . carvedilol (COREG) 25 MG tablet TAKE 1 TABLET BY MOUTH TWICE A DAY WITH MEALS (Patient taking differently: Take 25 mg by mouth 2 (two) times daily with a meal. ) 180 tablet 1  . clopidogrel (PLAVIX) 75 MG tablet TAKE 1 TABLET BY MOUTH EVERY DAY (Patient taking differently: Take 75 mg by mouth daily. ) 90 tablet 1  . feeding supplement, ENSURE ENLIVE, (ENSURE ENLIVE) LIQD Take 237 mLs by mouth 3 (three) times daily between meals. 237 mL 12  . feeding supplement, GLUCERNA SHAKE, (GLUCERNA SHAKE) LIQD Take 237 mLs by mouth 3 (three) times daily between meals.  0  . gabapentin (NEURONTIN) 100 MG capsule Take 100 mg by mouth 3 (three) times daily.    Monique Cabrera Kitchen glucose 4 GM chewable tablet Chew 1 tablet (4 g  total) by mouth as needed for low blood sugar. 50 tablet 12  . insulin aspart (NOVOLOG) 100 UNIT/ML injection 0-9 Units, Subcutaneous, 3 times daily with meals CBG < 70: Implement Hypoglycemia measures,call MD CBG 70 - 120: 0 units CBG 121 - 150: 1 unit CBG 151 - 200: 2 units CBG 201 - 250: 3 units CBG 251 - 300: 5 units CBG 301 - 350: 7 units CBG 351 - 400: 9 units CBG > 400: call MD 10 mL 11  . Insulin Pen Needle (B-D ULTRAFINE III SHORT PEN) 31G X 8 MM MISC Use to check blood sugar every day. Dx: 11.29; 11.65 100 each 1  . LANTUS SOLOSTAR 100 UNIT/ML Solostar Pen Inject 10 Units into the skin at bedtime.    . levETIRAcetam (KEPPRA) 250 MG tablet Take 1 tablet (250 mg total) by mouth 2 (two) times daily.    Monique Cabrera VERIO test strip USE TO TEST BLOOD SUGAR THREE TIMES DAILY. DX: E11.9 300 strip 3   No current facility-administered medications on file prior to visit.     Allergies  Allergen Reactions  . Invokana [Canagliflozin] Itching, Swelling and Other (See Comments)    Vaginal itching, swelling and irritation  . Jardiance [Empagliflozin] Itching, Swelling and Other (See Comments)    Vaginal itching and swelling     Objective:  Vitals:   03/05/20 1345  Temp: (!) 97.3 F (36.3 C)    85 y.o. AA female WD, WN IN NAD. AAO X 3.  Vascular Examination: Capillary fill time to digits delayed. Nonpalpable DP Nonpalpable pedal pulses left LE.  Pedal hair absent LLE. Skin temperature gradient remains WNL.  Hyperpigmentation noted left foot. Nonpitting edema noted left foot. There is no gangrenous odor of digits noted on today's visit. Left foot remains warm.   Dermatological Examination:  Pedal skin is thin shiny, atrophic LLE.  There is flocculence noted medial aspect left 2nd digit. There is a subungual abscess with complete onycholysis of the nailplate with white, creamy drainage expressed with palpation. Abscess localized to nailbed. No extension into deep  tissues.  Musculoskeletal Examination: Normal muscle strength 5/5 to all lower extremity muscle groups LLE, no gross bony deformities LLE, no pain crepitus or joint limitation noted with ROM LLE.  She has a below knee amputation of the RLE and utilizes wheelchair for mobility assistance  Neurological Examination: Protective sensation diminished with 10g monofilament LLE.  Xray findings left foot: no gas in tissues, +vessel calcifications noted, fracture noted left 1st metatarsal shaft, position unchanged and no bone erosion noted at location of abscess.  Assessment: Onychomycosis  Cellulitis and abscess of toe of left foot - Plan: DG Foot Complete Left, WOUND CULTURE, mupirocin ointment (BACTROBAN) 2 %, cephALEXin (KEFLEX) 500 MG capsule  Edema of left foot - Plan: DG Foot Complete Left  Status post fall - Plan: DG Foot Complete Left  Type II diabetes mellitus with peripheral circulatory disorder (HCC) - Plan: LANTUS SOLOSTAR 100 UNIT/ML Solostar Pen  Plan: -Toenails 1, 3, 4, 5 left foot debrided in length and girth without iatrogenic bleeding with sterile nail nipper and dremel.  -Evacuated localized abscess left 2nd digit. Culture and sensitivity taken. Nailbed irrigated with alcohol. Silvadene Cream and band-aid applied. She was given written instructions for dressing changes. Follow up one week. Had discussion with Ms. Blumenthal, this may or may not heal due to her circulation. She was educated on signs of gangrene/nonhealing. Prescription written for Mupirocin Ointment. Patient is to apply to left 2nd digit nailbed once daily and cover with dressing.  -Prescription sent to pharmacy for Keflex 500 mg. Take 1 capsule (500 mg total) by mouth 4 (four) times daily for 5 days. -Patient to continue soft, supportive shoe gear daily. -Patient to report any pedal injuries to medical professional immediately. -Xray of left foot foot was performed and reviewed with patient and/or POA. -Patient  was given written instructions on offloading and dressing changes/aftercare and was instructed to call immediately if any signs or symptoms of infection arise.  -Advised Ms. Ahmed she needs to see Dr. Donnetta Hutching for follow-up of fall and change in appearance of left foot. -We did discuss she has had several conversations with medical professionals and social work regarding her living alone. It simply is not safe for her given the number of falls she has had. She relates she is open to moving in with her youngest daughter and son-in-law. She will speak with them again and states she needs 1-2 months to get moved.  -Patient/POA to call should there be question/concern in the interim.  Return in about 10 days (around 03/15/2020) for wound second left toe.

## 2020-03-06 NOTE — Progress Notes (Signed)
Location:  [facility] clinic Provider: Christol Thetford L. Mariea Clonts, D.O., C.M.D.  Goals of Care:  Advanced Directives 03/06/2020  Does Monique Cabrera Have a Medical Advance Directive? Yes  Type of Advance Directive Out of facility DNR (pink MOST or yellow form)  Does Monique Cabrera want to make changes to medical advance directive? No - Monique Cabrera declined  Would Monique Cabrera like information on creating a medical advance directive? -  Pre-existing out of facility DNR order (yellow form or pink MOST form) -     Chief Complaint  Monique Cabrera presents with  . Transitions Of Care    discharged 02/28/21 F/u from [facility]     HPI: Monique Cabrera is a 85 y.o. female seen today for hospital follow-up s/p admission from 2/12-2/18 and then [facility] 2/18-02/28/21.    Her grandson had found her on the floor out of it.  She had right hemiparesis.  Her imaging was negative for acute stroke.  Sugar had been very high.  She'd had a bad headache for a couple of days prior to the fall (she'd hit her head on her bedside table).  She was using tylenol for it--3 tabs, then does not remember what happened after that.  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  EEG was negative.  She also had covid-19 and was treated with remdesivir.  She had mild DKA.  Hba1c  Was way up at 41 on 2/13 with her brittle diabetes.  Magnesium was low.    She keeps saying she tries to get up out of bed and step on her right leg which of course she does not have.  She ends up sitting.  She even fell at [facility] where she went after her hospitalization.  She was seen by Dr. Elisha Ponder.  She is now having swelling of her left leg/foot which she reports started while at [name].    She's been taking lantus 20 units in the morning.    She thinks the date is wrong in her meter.    Talks about how good her grandson is with helping take care of her as needed after she took care of him growing up.   He works nights.  He lives in her same  neighborhood.    Is getting [facility] coming out twice a week and getting PT and OT.  PT will be tomorrow b/c of her appt today.  Gets her first covid vaccine in 2 days.    She is going to get 14 days of meals somehow through her insurance.    She does plan to move with her daughter and her husband.  May do this May 1st.   Monique Cabrera is a 85 y.o. female seen today for hospital follow-up s/p admission from 2/12-2/18 and then [facility] 2/18-02/28/21.    Her grandson had found her on the floor out of it.  She had right hemiparesis.  Her imaging was negative for acute stroke.  Sugar had been very high.  She'd had a bad headache for a couple of days prior to the fall (she'd hit her head on her bedside table).  She was using tylenol for it--3 tabs, then does not remember what happened after that.  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  EEG was negative.  She also had covid-19 and was treated with remdesivir.  She had mild DKA.  Hba1c  Was way up at 33 on 2/13 with  her brittle diabetes.  Magnesium was low.    She keeps saying she tries to get up out of bed and step on her right leg which of course she does not have.  She ends up sitting.  She even fell at [name] where she went after her hospitalization.  She was seen by Dr. Elisha Ponder.  She is now having swelling of her left leg/foot which she reports started while at [facility].    She's been taking lantus 20 units in the morning.    She thinks the date is wrong in her meter.    Talks about how good her grandson is with helping take care of her as needed after she took care of him growing up.   He works nights.  He lives in her same neighborhood.    Is getting [facility] coming out twice a week and getting PT and OT.  PT will be tomorrow b/c of her appt today.  Gets her first covid vaccine in 2 days.    She is going to get 14 days of meals somehow through her insurance.    She  does plan to move with her daughter and her husband.  May do this May 1st.    [facility] notes reviewed (resident had copies).  Indicated that pt was discharged home on lantus 10 units qhs and supposed to be on novolog sliding scale but no Rx was given and pt has never taking a sliding scale (nor is she probably able to safely do that).  She has continued her previous home regimen of lantus 20 units.  She is getting [facility] home [facility].  She had previously been on humalog kwikpen 6 units after breakfast, 4 units after lunch and 6 units after supper only if CBGs were over 150.  She's not been testing consistently.  Date/time on meter updated today (though might get messed up with time change this coming weekend).  No currently using any mealtime insulin.    (note that this hpi was written previously in another note and had to be copied due to computer error)  Past Medical History:  Diagnosis Date  . Acute upper respiratory infections of unspecified site   . Amputee 08/2019  . Anemia   . Anemia, unspecified   . Atherosclerosis of native arteries of the extremities, unspecified   . Chest pain, unspecified   . Chronic kidney disease (CKD), stage II (mild)   . Diarrhea   . Disorder of bone and cartilage, unspecified   . Herpes zoster with other nervous system complications(053.19)   . Hypercalcemia   . Hypertension   . Hypertensive renal disease, benign   . Nonspecific reaction to tuberculin skin test without active tuberculosis(795.51)   . Other and unspecified hyperlipidemia   . PAD (peripheral artery disease) ([place])    Per records from [facility]  . Pain in joint, lower leg   . Peripheral arterial disease ([place])   . Postherpetic neuralgia   . Proteinuria   . Stroke ([place]) 01/2017  . Type II or unspecified type diabetes mellitus with renal manifestations, uncontrolled(250.42)   . Unspecified disorder of kidney and ureter     Past Surgical History:  Procedure Laterality Date  .  ABDOMINAL AORTOGRAM W/LOWER EXTREMITY N/A 10/31/2019   Procedure: ABDOMINAL AORTOGRAM W/LOWER EXTREMITY;  Surgeon: Wellington Hampshire, MD;  Location: [facility];  Service: Cardiovascular;  Laterality: N/A;  . AMPUTATION Right 11/02/2019   Procedure: AMPUTATION BELOW KNEE RIGHT;  Surgeon: Rosetta Posner, MD;  Location: [name] OR;  Service: Vascular;  Laterality: Right;  . hysterectomy    . INCISION AND DRAINAGE Left 05/27/14   sebacous cyst, ear  . PRP Left    Dr. Ricki Miller  . removal of cyst from hand    . removal of tumor from foot    . TONSILLECTOMY      Allergies  Allergen Reactions  . Invokana [Canagliflozin] Itching, Swelling and Other (See Comments)    Vaginal itching, swelling and irritation  . Jardiance [Empagliflozin] Itching, Swelling and Other (See Comments)    Vaginal itching and swelling  . Tuberculin Ppd Other (See Comments)    Per records from [facility] Encounter Medications as of 03/06/2020  Medication Sig  . amLODipine (NORVASC) 10 MG tablet TAKE 1 TABLET (10 MG TOTAL) BY MOUTH DAILY. FOR HIGH BLOOD PRESSURE  . aspirin 81 MG chewable tablet Chew 1 tablet (81 mg total) by mouth daily.  Marland Kitchen atorvastatin (LIPITOR) 20 MG tablet TAKE 1 TABLET BY MOUTH EVERY DAY  . carvedilol (COREG) 25 MG tablet TAKE 1 TABLET BY MOUTH TWICE A DAY WITH MEALS  . cephALEXin (KEFLEX) 500 MG capsule Take 1 capsule (500 mg total) by mouth 4 (four) times daily for 5 days.  . clopidogrel (PLAVIX) 75 MG tablet TAKE 1 TABLET BY MOUTH EVERY DAY  . feeding supplement, ENSURE ENLIVE, (ENSURE ENLIVE) LIQD Take 237 mLs by mouth 3 (three) times daily between meals.  . feeding supplement, GLUCERNA SHAKE, (GLUCERNA SHAKE) LIQD Take 237 mLs by mouth 3 (three) times daily between meals.  . gabapentin (NEURONTIN) 100 MG capsule Take 100 mg by mouth 3 (three) times daily.  Marland Kitchen glucose 4 GM chewable tablet Chew 1 tablet (4 g total) by mouth as needed for low blood sugar.  . Insulin Pen Needle  (B-D ULTRAFINE III SHORT PEN) 31G X 8 MM MISC Use to check blood sugar every day. Dx: 11.29; 11.65  . LANTUS SOLOSTAR 100 UNIT/ML Solostar Pen Inject 20 Units into the skin at bedtime.  . levETIRAcetam (KEPPRA) 250 MG tablet Take 1 tablet (250 mg total) by mouth 2 (two) times daily.  . mupirocin ointment (BACTROBAN) 2 % Apply to left 2nd toe once daily.  Glory Rosebush VERIO test strip USE TO TEST BLOOD SUGAR THREE TIMES DAILY. DX: E11.9  . [DISCONTINUED] insulin aspart (NOVOLOG) 100 UNIT/ML injection 0-9 Units, Subcutaneous, 3 times daily with meals CBG < 70: Implement Hypoglycemia measures,call MD CBG 70 - 120: 0 units CBG 121 - 150: 1 unit CBG 151 - 200: 2 units CBG 201 - 250: 3 units CBG 251 - 300: 5 units CBG 301 - 350: 7 units CBG 351 - 400: 9 units CBG > 400: call MD   No facility-administered encounter medications on file as of 03/06/2020.    Review of Systems:  Review of Systems  Constitutional: Negative for chills and fever.  HENT: Negative for congestion and sore throat.   Respiratory: Negative for cough and shortness of breath.   Cardiovascular: Negative for chest pain, palpitations and leg swelling.  Gastrointestinal: Negative for abdominal pain, blood in stool, constipation, diarrhea, nausea and vomiting.  Genitourinary: Negative for dysuria.  Musculoskeletal: Positive for falls.       Right AKA  Skin: Negative for itching and rash.  Neurological: Positive for seizures. Negative for dizziness, focal weakness and loss of consciousness.  Psychiatric/Behavioral: Positive for memory loss. Negative for depression. The Monique Cabrera is not nervous/anxious  and does not have insomnia.     Health Maintenance  Topic Date Due  . OPHTHALMOLOGY EXAM  07/25/2020  . HEMOGLOBIN A1C  08/08/2020  . FOOT EXAM  11/14/2020  . INFLUENZA VACCINE  Completed  . DEXA SCAN  Completed  . PNA vac Low Risk Adult  Completed    Physical Exam: Vitals:   03/06/20 1458  BP: 122/62  Pulse: 70  Temp:  (!) 97.5 F (36.4 C)  TempSrc: Temporal  SpO2: 98%  Weight: 123 lb (55.8 kg)  Height: 5\' 5"  (1.651 m)   Body mass index is 20.47 kg/m. Physical Exam Vitals reviewed.  Constitutional:      Appearance: Normal appearance.  Cardiovascular:     Rate and Rhythm: Normal rate and regular rhythm.  Pulmonary:     Effort: Pulmonary effort is normal.     Breath sounds: Normal breath sounds. No wheezing, rhonchi or rales.  Abdominal:     General: Bowel sounds are normal.  Musculoskeletal:     Left lower leg: Edema present.     Comments: R AKA  Neurological:     General: No focal deficit present.     Mental Status: She is alert and oriented to person, place, and time.     Comments: Has little recall of entire hospitalization and rehab stay  Psychiatric:     Comments: Talkative today--from moment I entered until I left with a short pause during heart and lung exam and a little when I was speaking.     Labs reviewed: Basic Metabolic Panel: Recent Labs    10/29/19 2336 10/30/19 0552 11/01/19 0403 11/02/19 0521 02/11/20 0657 02/11/20 0657 02/12/20 0332 02/13/20 0504 02/14/20 0714  NA  --    < > 132*   < > 140   < > 137 138 134*  K  --    < > 3.9   < > 3.7   < > 3.3* 4.1 3.9  CL  --    < > 103   < > 112*   < > 107 110 104  CO2  --    < > 19*   < > 20*   < > 21* 21* 21*  GLUCOSE  --    < > 219*   < > 103*   < > 102* 158* 209*  BUN  --    < > 24*   < > 19   < > 19 17 25*  CREATININE  --    < > 1.37*   < > 1.40*   < > 1.60* 1.59* 1.70*  CALCIUM  --    < > 9.9   < > 9.6   < > 9.5 9.2 9.1  MG 1.6*   < > 1.7  --  1.2*   < > 1.2* 1.4* 1.3*  PHOS 2.1*  --  1.8*  --  2.1*  --  2.3*  --   --   TSH 0.989  --   --   --   --   --   --   --   --    < > = values in this interval not displayed.   Liver Function Tests: Recent Labs    02/11/20 0657 02/12/20 0332 02/13/20 0504  AST 21 20 20   ALT 14 15 14   ALKPHOS 54 54 54  BILITOT 1.0 0.9 0.7  PROT 5.9* 5.5* 5.0*  ALBUMIN 2.5* 2.6*  2.2*   No results for input(s): LIPASE, AMYLASE in  the last 8760 hours. No results for input(s): AMMONIA in the last 8760 hours. CBC: Recent Labs    02/11/20 0657 02/12/20 0332 02/13/20 0504  WBC 6.9 6.2 6.2  NEUTROABS 4.3 3.4 3.3  HGB 11.1* 11.1* 10.1*  HCT 32.2* 32.0* 30.0*  MCV 90.4 89.9 90.9  PLT 228 233 220   Lipid Panel: Recent Labs    02/09/20 1242  CHOL 242*  HDL 69  LDLCALC 153*  TRIG 98  CHOLHDL 3.5   Lab Results  Component Value Date   HGBA1C 11.0 (H) 02/09/2020    Procedures since last visit: CT ANGIO HEAD W OR WO CONTRAST  Addendum Date: 02/08/2020   ADDENDUM REPORT: 02/08/2020 14:54 ADDENDUM: A 2 mm left MCA bifurcation aneurysm is stable from the prior exam. This is noted on further inquiry for explanation of right-sided symptoms. This is unlikely to be related to the Monique Cabrera's symptoms. Electronically Signed   By: San Morelle M.D.   On: 02/08/2020 14:54   Result Date: 02/08/2020 CLINICAL DATA:  Right-sided weakness. EXAM: CT ANGIOGRAPHY HEAD AND NECK TECHNIQUE: Multidetector CT imaging of the head and neck was performed using the standard protocol during bolus administration of intravenous contrast. Multiplanar CT image reconstructions and MIPs were obtained to evaluate the vascular anatomy. Carotid stenosis measurements (when applicable) are obtained utilizing NASCET criteria, using the distal internal carotid diameter as the denominator. CONTRAST:  68mL OMNIPAQUE IOHEXOL 350 MG/ML SOLN COMPARISON:  None. FINDINGS: CTA NECK FINDINGS Aortic arch: Atherosclerotic calcifications are present within the aortic arch and at the origins the great vessels without significant stenosis or change. There is no aneurysm. Right carotid system: Right common carotid artery is within normal limits. Bifurcation is somewhat obscured by Monique Cabrera motion. No significant stenosis is present. Mild tortuosity is present in the cervical right ICA without significant stenosis. Left  carotid system: Left common carotid artery is tortuous. Atherosclerotic changes are present at the bifurcation without significant stenosis. There is mild tortuosity of the cervical left ICA without significant stenosis. Vertebral arteries: The left vertebral artery is the dominant vessel. Both vertebral arteries originate from the subclavian arteries without significant stenosis. There is no definite stenosis in the neck. Skeleton: Degenerative changes are present lower cervical spine. Osseous structures are somewhat distorted by Monique Cabrera motion. No acute abnormalities are present. Other neck: The soft tissues the neck are otherwise unremarkable. Upper chest: Centrilobular emphysematous changes are present. No nodule or mass lesion is present. Thoracic inlet is normal. Review of the MIP images confirms the above findings CTA HEAD FINDINGS Anterior circulation: Atherosclerotic changes are present within the cavernous internal carotid arteries bilaterally without significant stenosis. Mild narrowing is present in the proximal right A1 segment. No emergent large vessel occlusion is present. There is some attenuation of distal small vessels. Posterior circulation: The left vertebral artery is the dominant vessel. No significant stenosis is present. There is mild narrowing the mid basilar artery. Both posterior cerebral arteries originate from basilar tip. Distal vessel attenuation is present without a significant proximal stenosis or occlusion. Venous sinuses: The dural sinuses patent. The straight sinus and deep cerebral veins are intact. Cortical veins are distorted by Monique Cabrera motion. Anatomic variants: None Review of the MIP images confirms the above findings IMPRESSION: 1. No emergent large vessel occlusion. 2. Atherosclerotic changes at the aortic arch, left carotid bifurcation, and cavernous internal carotid arteries bilaterally without significant stenosis. 3. Mild narrowing of the proximal right A1 segment. 4.  Distal small vessel attenuation without a significant proximal stenosis,  aneurysm, or branch vessel occlusion within the Circle of Willis. These results were called by telephone at the time of interpretation on 02/08/2020 at 10:05am to provider Dr. Lorraine Lax, Who verbally acknowledged these results. Electronically Signed: By: San Morelle M.D. On: 02/08/2020 10:34   CT Angio Neck W and/or Wo Contrast  Addendum Date: 02/08/2020   ADDENDUM REPORT: 02/08/2020 14:54 ADDENDUM: A 2 mm left MCA bifurcation aneurysm is stable from the prior exam. This is noted on further inquiry for explanation of right-sided symptoms. This is unlikely to be related to the Monique Cabrera's symptoms. Electronically Signed   By: San Morelle M.D.   On: 02/08/2020 14:54   Result Date: 02/08/2020 CLINICAL DATA:  Right-sided weakness. EXAM: CT ANGIOGRAPHY HEAD AND NECK TECHNIQUE: Multidetector CT imaging of the head and neck was performed using the standard protocol during bolus administration of intravenous contrast. Multiplanar CT image reconstructions and MIPs were obtained to evaluate the vascular anatomy. Carotid stenosis measurements (when applicable) are obtained utilizing NASCET criteria, using the distal internal carotid diameter as the denominator. CONTRAST:  28mL OMNIPAQUE IOHEXOL 350 MG/ML SOLN COMPARISON:  None. FINDINGS: CTA NECK FINDINGS Aortic arch: Atherosclerotic calcifications are present within the aortic arch and at the origins the great vessels without significant stenosis or change. There is no aneurysm. Right carotid system: Right common carotid artery is within normal limits. Bifurcation is somewhat obscured by Monique Cabrera motion. No significant stenosis is present. Mild tortuosity is present in the cervical right ICA without significant stenosis. Left carotid system: Left common carotid artery is tortuous. Atherosclerotic changes are present at the bifurcation without significant stenosis. There is mild tortuosity  of the cervical left ICA without significant stenosis. Vertebral arteries: The left vertebral artery is the dominant vessel. Both vertebral arteries originate from the subclavian arteries without significant stenosis. There is no definite stenosis in the neck. Skeleton: Degenerative changes are present lower cervical spine. Osseous structures are somewhat distorted by Monique Cabrera motion. No acute abnormalities are present. Other neck: The soft tissues the neck are otherwise unremarkable. Upper chest: Centrilobular emphysematous changes are present. No nodule or mass lesion is present. Thoracic inlet is normal. Review of the MIP images confirms the above findings CTA HEAD FINDINGS Anterior circulation: Atherosclerotic changes are present within the cavernous internal carotid arteries bilaterally without significant stenosis. Mild narrowing is present in the proximal right A1 segment. No emergent large vessel occlusion is present. There is some attenuation of distal small vessels. Posterior circulation: The left vertebral artery is the dominant vessel. No significant stenosis is present. There is mild narrowing the mid basilar artery. Both posterior cerebral arteries originate from basilar tip. Distal vessel attenuation is present without a significant proximal stenosis or occlusion. Venous sinuses: The dural sinuses patent. The straight sinus and deep cerebral veins are intact. Cortical veins are distorted by Monique Cabrera motion. Anatomic variants: None Review of the MIP images confirms the above findings IMPRESSION: 1. No emergent large vessel occlusion. 2. Atherosclerotic changes at the aortic arch, left carotid bifurcation, and cavernous internal carotid arteries bilaterally without significant stenosis. 3. Mild narrowing of the proximal right A1 segment. 4. Distal small vessel attenuation without a significant proximal stenosis, aneurysm, or branch vessel occlusion within the Circle of Willis. These results were called by  telephone at the time of interpretation on 02/08/2020 at 10:05am to provider Dr. Lorraine Lax, Who verbally acknowledged these results. Electronically Signed: By: San Morelle M.D. On: 02/08/2020 10:34   DG CHEST PORT 1 VIEW  Result Date: 02/08/2020 CLINICAL DATA:  COVID-19 positive, altered mental status EXAM: PORTABLE CHEST 1 VIEW COMPARISON:  Radiograph 10/04/2019 FINDINGS: Coarse interstitial changes are similar to priors. No consolidation, features of edema, pneumothorax, or effusion. Pulmonary vascularity is normally distributed. The aorta is calcified. The remaining cardiomediastinal contours are unremarkable. No acute osseous or soft tissue abnormality. No acute osseous or soft tissue abnormality. IMPRESSION: No acute cardiopulmonary findings. Coarse interstitial changes are similar to priors. Aortic Atherosclerosis (ICD10-I70.0). Electronically Signed   By: Lovena Le M.D.   On: 02/08/2020 19:47   DG Abd Portable 1V  Result Date: 02/09/2020 CLINICAL DATA:  Metal screening prior to MRI. EXAM: PORTABLE ABDOMEN - 1 VIEW COMPARISON:  CT abdomen and pelvis 08/21/2012 FINDINGS: No radiopaque foreign body is identified in the abdomen or pelvis with incomplete imaging of the inferior pelvic ring. No dilated loops of bowel are seen to suggest obstruction. Atherosclerotic vascular calcifications are noted. The visualized lung bases are grossly clear. IMPRESSION: No metallic foreign body identified. Electronically Signed   By: Logan Bores M.D.   On: 02/09/2020 10:04   EEG adult  Result Date: 02/08/2020 Lora Havens, MD     02/08/2020  1:32 PM Monique Cabrera Name: Monique Cabrera MRN: [id number] Epilepsy Attending: Lora Havens Referring Physician/Provider: Laurey Morale, NP Date: 02/08/2020 Duration: 36.20 minutes Monique Cabrera history: 84 year old female with prior medical history of stroke who presented with unresponsiveness and right-sided weakness.  CT head negative for hemorrhage.  EEG evaluate for  seizures. Level of alertness: Lethargic AEDs during EEG study: None Technical aspects: This EEG study was done with scalp electrodes positioned according to the 10-20 International system of electrode placement. Electrical activity was acquired at a sampling rate of 500Hz  and reviewed with a high frequency filter of 70Hz  and a low frequency filter of 1Hz . EEG data were recorded continuously and digitally stored. Description: EEG showed continuous generalized low amplitude 2 to 3 Hz delta slowing as well as 13 to 15 Hz frontocentral beta activity.  Hyperventilation and photic stimulation were not performed. Of note in the beginning, study was technically difficult due to significant myogenic artifact. Abnormality -Continuous slow, generalized IMPRESSION: This study is suggestive of moderate to severe diffuse encephalopathy, nonspecific etiology. No seizures or epileptiform discharges were seen throughout the recording. Lora Havens   CT HEAD CODE STROKE WO CONTRAST  Result Date: 02/08/2020 CLINICAL DATA:  Code stroke. Neuro deficit, acute, stroke suspected. EXAM: CT HEAD WITHOUT CONTRAST TECHNIQUE: Contiguous axial images were obtained from the base of the skull through the vertex without intravenous contrast. COMPARISON:  None. FINDINGS: Brain: No acute infarct, hemorrhage, or mass lesion is present. Moderate generalized atrophy and white matter disease is stable. The ventricles are proportionate to the degree of atrophy. Basal ganglia and insular cortex is bilaterally. No acute or focal cortical abnormality is present. No significant extraaxial fluid collection is present. The brainstem and cerebellum are within normal limits. Vascular: Extensive vascular calcifications extend to the MCA bifurcations bilaterally without significant interval change. No other asymmetric hyperdense vessel is present. Dense calcifications in the basilar artery vertebral arteries are also stable. Skull: Insert normal skull No  significant extracranial soft tissue lesion is present. Sinuses/Orbits: The paranasal sinuses and mastoid air cells are clear. Bilateral lens replacements are noted. Globes and orbits are otherwise unremarkable. Other: ASPECTS ([place] Stroke Program Early CT Score) - Ganglionic level infarction (caudate, lentiform nuclei, internal capsule, insula, M1-M3 cortex): 3/3 - Supraganglionic infarction (M4-M6 cortex): 7/7 Total score (0-10 with 10 being normal): 10/10 IMPRESSION: 1.  No acute intracranial abnormality or significant interval change. 2. Stable advanced atrophy and diffuse white matter disease. 3. Advanced calcific atherosclerotic changes extend to the MCA bifurcations and the basilar artery are stable. 4. ASPECTS is 10/10 1. The above was relayed via text pager to Dr. Lorraine Lax on 02/08/2020 at 10:03 . Electronically Signed   By: San Morelle M.D.   On: 02/08/2020 10:04   ECHOCARDIOGRAM LIMITED  Result Date: 02/09/2020    ECHOCARDIOGRAM LIMITED REPORT   Monique Cabrera Name:   PAULITA LICKLIDER Date of Exam: 02/09/2020 Medical Rec #:  [id number]        Height:       65.0 in Accession #:    [id number]       Weight:       132.3 lb Date of Birth:  [birth year]        BSA:          1.66 m Monique Cabrera Age:    70 years         BP:           209/93 mmHg Monique Cabrera Gender: F                HR:           94 bpm. Exam Location:  Inpatient Procedure: Limited Echo and Color Doppler Indications:    CVA  History:        Monique Cabrera has prior history of Echocardiogram examinations, most                 recent 02/18/2017. Stroke and PAD; Risk Factors:Hypertension,                 Diabetes and Dyslipidemia.  Sonographer:    Dustin Flock Referring Phys: [place] Comments: Image acquisition challenging due to uncooperative Monique Cabrera and cant follow commands. IMPRESSIONS  1. Left ventricular ejection fraction, by estimation, is 60 to 65%. The left ventricle has normal function. The left ventricle has no regional wall  motion abnormalities. Left ventricular diastolic function could not be evaluated.  2. Right ventricular systolic function is normal. The right ventricular size is normal.  3. The mitral valve is normal in structure and function. No evidence of mitral valve regurgitation. No evidence of mitral stenosis.  4. The aortic valve is normal in structure and function. Aortic valve regurgitation is not visualized. Mild aortic valve sclerosis is present, with no evidence of aortic valve stenosis.  5. The inferior vena cava is normal in size with greater than 50% respiratory variability, suggesting right atrial pressure of 3 mmHg. Conclusion(s)/Recomendation(s): Limited study due to poor Monique Cabrera cooperation. FINDINGS  Left Ventricle: Left ventricular ejection fraction, by estimation, is 60 to 65%. The left ventricle has normal function. The left ventricle has no regional wall motion abnormalities. The left ventricular internal cavity size was normal in size. There is  no left ventricular hypertrophy. The left ventricular diastology could not be evaluated due to indeterminate diastolic function. Right Ventricle: The right ventricular size is normal. No increase in right ventricular wall thickness. Right ventricular systolic function is normal. Left Atrium: Left atrial size was normal in size. Right Atrium: Right atrial size was normal in size. Pericardium: There is no evidence of pericardial effusion. Mitral Valve: The mitral valve is normal in structure and function. Normal mobility of the mitral valve leaflets. No evidence of mitral valve stenosis. Tricuspid Valve: The tricuspid valve is normal in structure. Tricuspid valve regurgitation is not demonstrated. No  evidence of tricuspid stenosis. Aortic Valve: The aortic valve is normal in structure and function. Aortic valve regurgitation is not visualized. Mild aortic valve sclerosis is present, with no evidence of aortic valve stenosis. Pulmonic Valve: The pulmonic valve was  normal in structure. Pulmonic valve regurgitation is not visualized. No evidence of pulmonic stenosis. Aorta: The aortic root is normal in size and structure. Venous: The inferior vena cava is normal in size with greater than 50% respiratory variability, suggesting right atrial pressure of 3 mmHg. IAS/Shunts: No atrial level shunt detected by color flow Doppler.  LEFT VENTRICLE PLAX 2D LVIDd:         3.27 cm Diastology LVIDs:         2.15 cm LV e' lateral: 6.31 cm/s LV PW:         1.07 cm LV e' medial:  3.48 cm/s LV IVS:        1.24 cm  Dani Gobble Croitoru MD Electronically signed by Sanda Klein MD Signature Date/Time: 02/09/2020/12:54:09 PM    Final     Assessment/Plan 1. Type II diabetes mellitus with renal manifestations, uncontrolled ([place]) -resume lantus 20 units which she's actually been taking -getting meals delivered to her (was not aware that was an issue) through her insurance -encouraged regular eating again and checking cbgs before meals and bedtime -will hold off on novolog right now due to recurrent hypoglycemia she's had with it, but may need to add some back to improve control depending on how readings are doing - CBC with Differential/Platelet - Basic metabolic panel  2. PAD (peripheral artery disease) ([place]) -keep f/u with podiatry for close monitoring of left foot swelling and abscess that was drained beneath toenail  3. Seizure ([place]) -felt to be cause of her passing out at home (?due to covid or HHNK)  4. Hypoglycemia due to type 2 diabetes mellitus ([place]) - has been an ongoing concern, keep off novolog for now--she cannot manage a sliding scale -may resume mealtime insulin if CBGs running too high at home and she continues consistent eating which is often what she does not do (makes poor choices) - Basic metabolic panel  5. Chronic kidney disease (CKD), stage II (mild) -Avoid nephrotoxic agents like nsaids, dose adjust renally excreted meds, hydrate. - CBC with  Differential/Platelet - Basic metabolic panel  I am happy to hear she plans to move in with her daughter around May.  She needs more assistance at home to manage her diabetes and to be safe.  Continue home health right now.  Labs/tests ordered:   Lab Orders     CBC with Differential/Platelet     Basic metabolic panel  Next appt:  04/03/2020 f/u on diabetes  Jayona Mccaig L. Armie Moren, D.O. [facility] [address], [place] [address] Cell Phone (Mon-Fri 8am-5pm):  [phone] On Call:  [phone] & follow prompts after 5pm & weekends Office Phone:  [phone] Office Fax:  [phone]

## 2020-03-07 ENCOUNTER — Telehealth (INDEPENDENT_AMBULATORY_CARE_PROVIDER_SITE_OTHER): Payer: Medicare Other | Admitting: Podiatry

## 2020-03-07 ENCOUNTER — Telehealth: Payer: Self-pay | Admitting: Internal Medicine

## 2020-03-07 ENCOUNTER — Telehealth: Payer: Self-pay | Admitting: *Deleted

## 2020-03-07 DIAGNOSIS — E1151 Type 2 diabetes mellitus with diabetic peripheral angiopathy without gangrene: Secondary | ICD-10-CM | POA: Diagnosis not present

## 2020-03-07 DIAGNOSIS — Z89511 Acquired absence of right leg below knee: Secondary | ICD-10-CM | POA: Diagnosis not present

## 2020-03-07 DIAGNOSIS — Z993 Dependence on wheelchair: Secondary | ICD-10-CM | POA: Diagnosis not present

## 2020-03-07 DIAGNOSIS — Z9181 History of falling: Secondary | ICD-10-CM | POA: Diagnosis not present

## 2020-03-07 DIAGNOSIS — E1122 Type 2 diabetes mellitus with diabetic chronic kidney disease: Secondary | ICD-10-CM | POA: Diagnosis not present

## 2020-03-07 DIAGNOSIS — R569 Unspecified convulsions: Secondary | ICD-10-CM | POA: Diagnosis not present

## 2020-03-07 DIAGNOSIS — Z8673 Personal history of transient ischemic attack (TIA), and cerebral infarction without residual deficits: Secondary | ICD-10-CM | POA: Diagnosis not present

## 2020-03-07 DIAGNOSIS — Z7902 Long term (current) use of antithrombotics/antiplatelets: Secondary | ICD-10-CM | POA: Diagnosis not present

## 2020-03-07 DIAGNOSIS — Z794 Long term (current) use of insulin: Secondary | ICD-10-CM | POA: Diagnosis not present

## 2020-03-07 DIAGNOSIS — B0229 Other postherpetic nervous system involvement: Secondary | ICD-10-CM | POA: Diagnosis not present

## 2020-03-07 DIAGNOSIS — Z8616 Personal history of COVID-19: Secondary | ICD-10-CM | POA: Diagnosis not present

## 2020-03-07 DIAGNOSIS — I129 Hypertensive chronic kidney disease with stage 1 through stage 4 chronic kidney disease, or unspecified chronic kidney disease: Secondary | ICD-10-CM | POA: Diagnosis not present

## 2020-03-07 DIAGNOSIS — Z7982 Long term (current) use of aspirin: Secondary | ICD-10-CM | POA: Diagnosis not present

## 2020-03-07 DIAGNOSIS — D631 Anemia in chronic kidney disease: Secondary | ICD-10-CM | POA: Diagnosis not present

## 2020-03-07 DIAGNOSIS — E11649 Type 2 diabetes mellitus with hypoglycemia without coma: Secondary | ICD-10-CM | POA: Diagnosis not present

## 2020-03-07 DIAGNOSIS — N182 Chronic kidney disease, stage 2 (mild): Secondary | ICD-10-CM | POA: Diagnosis not present

## 2020-03-07 NOTE — Telephone Encounter (Signed)
I called Monique Cabrera to clarify her seeing Dr. Sherren Mocha Early for her left foot. I explained to her although she feels her foot is fine, I wanted Dr. Donnetta Hutching to assess her. Her concern is the amount of appointments she has since being discharged from the hospital. She has to arrange transportation with social services or get her grandson to bring her to appointments.   She states her left 2nd toe feels better and she is applying the mupirocin ointment daily. States color has improved. She is taking her Keflex as instructed. Denies any rest pain. She is able to bear weight on the limb with no problem.   I again reiterated I wanted her to see Dr. Donnetta Hutching and she stated she will call his office on Monday morning (03/10/2020) and schedule an appointment to see Dr. Donnetta Hutching.

## 2020-03-07 NOTE — Telephone Encounter (Signed)
Monique with [facility] needs verbal orders for home PT for:  1 week 1 2 weeks 5 1 week 1  For patient strengthening, safe transfers, gait, balance, exercise and home safety.  Please call Monique at [phone].  Thank  You   Isa Rankin

## 2020-03-07 NOTE — Progress Notes (Signed)
Anemia has worsened.  Appeared in notes from [facility] that her doctor there had ordered labs to evaluate the anemia, but the results were not in the packet.   Are we able to add an iron panel and B12 to her labs?   Her kidney function is stable. The other worrisome thing is that her sugar was 58 when I saw her which suggests she'd not eaten anything that morning.  This is why it's really not safe for her to be alone.

## 2020-03-07 NOTE — Telephone Encounter (Signed)
Of course, I'm fine with the [facility] home health orders.  Please call them back.

## 2020-03-07 NOTE — Telephone Encounter (Signed)
[facility] - Angela Nevin states this is an urgent referral and pt informed her, Dr. Elisha Ponder had said every thing ws okay and she is not going to make an appt until she see her doctor next week. I told Dr. Elisha Ponder of the pt refusing the referral.

## 2020-03-08 ENCOUNTER — Ambulatory Visit: Payer: Medicare Other | Attending: Internal Medicine

## 2020-03-08 DIAGNOSIS — Z23 Encounter for immunization: Secondary | ICD-10-CM

## 2020-03-08 LAB — WOUND CULTURE
MICRO NUMBER:: 10236112
SPECIMEN QUALITY:: ADEQUATE

## 2020-03-08 NOTE — Progress Notes (Signed)
   Covid-19 Vaccination Clinic  Name:  Monique Cabrera    MRN: [id number] DOB: 1935-07-31  03/08/2020  [name] was observed post Covid-19 immunization for 30 minutes based on pre-vaccination screening without incident. She was provided with Vaccine Information Sheet and instruction to access the V-Safe system.   [name] was instructed to call 911 with any severe reactions post vaccine: Marland Kitchen Difficulty breathing  . Swelling of face and throat  . A fast heartbeat  . A bad rash all over body  . Dizziness and weakness   Immunizations Administered    Name Date Dose VIS Date Route   Pfizer COVID-19 Vaccine 03/08/2020  9:34 AM 0.3 mL 12/07/2019 Intramuscular   Manufacturer: [place]   Lot: [id number]   [place]: [address]-5

## 2020-03-10 ENCOUNTER — Telehealth: Payer: Self-pay | Admitting: *Deleted

## 2020-03-10 DIAGNOSIS — N182 Chronic kidney disease, stage 2 (mild): Secondary | ICD-10-CM | POA: Diagnosis not present

## 2020-03-10 DIAGNOSIS — E11649 Type 2 diabetes mellitus with hypoglycemia without coma: Secondary | ICD-10-CM | POA: Diagnosis not present

## 2020-03-10 DIAGNOSIS — Z9181 History of falling: Secondary | ICD-10-CM | POA: Diagnosis not present

## 2020-03-10 DIAGNOSIS — Z8673 Personal history of transient ischemic attack (TIA), and cerebral infarction without residual deficits: Secondary | ICD-10-CM | POA: Diagnosis not present

## 2020-03-10 DIAGNOSIS — Z8616 Personal history of COVID-19: Secondary | ICD-10-CM | POA: Diagnosis not present

## 2020-03-10 DIAGNOSIS — B0229 Other postherpetic nervous system involvement: Secondary | ICD-10-CM | POA: Diagnosis not present

## 2020-03-10 DIAGNOSIS — Z794 Long term (current) use of insulin: Secondary | ICD-10-CM | POA: Diagnosis not present

## 2020-03-10 DIAGNOSIS — E1122 Type 2 diabetes mellitus with diabetic chronic kidney disease: Secondary | ICD-10-CM | POA: Diagnosis not present

## 2020-03-10 DIAGNOSIS — R569 Unspecified convulsions: Secondary | ICD-10-CM | POA: Diagnosis not present

## 2020-03-10 DIAGNOSIS — I129 Hypertensive chronic kidney disease with stage 1 through stage 4 chronic kidney disease, or unspecified chronic kidney disease: Secondary | ICD-10-CM | POA: Diagnosis not present

## 2020-03-10 DIAGNOSIS — Z7902 Long term (current) use of antithrombotics/antiplatelets: Secondary | ICD-10-CM | POA: Diagnosis not present

## 2020-03-10 DIAGNOSIS — D631 Anemia in chronic kidney disease: Secondary | ICD-10-CM | POA: Diagnosis not present

## 2020-03-10 DIAGNOSIS — E1151 Type 2 diabetes mellitus with diabetic peripheral angiopathy without gangrene: Secondary | ICD-10-CM | POA: Diagnosis not present

## 2020-03-10 DIAGNOSIS — Z89511 Acquired absence of right leg below knee: Secondary | ICD-10-CM | POA: Diagnosis not present

## 2020-03-10 DIAGNOSIS — Z993 Dependence on wheelchair: Secondary | ICD-10-CM | POA: Diagnosis not present

## 2020-03-10 DIAGNOSIS — Z7982 Long term (current) use of aspirin: Secondary | ICD-10-CM | POA: Diagnosis not present

## 2020-03-10 NOTE — Telephone Encounter (Signed)
Verbal orders given to [place] for Monique Cabrera to continue PT [phone]

## 2020-03-10 NOTE — Telephone Encounter (Signed)
[facility] - Angela Nevin states Sonya, Referral Coordinator called pt today and pt states she can not come in tomorrow because she uses Social Services for transportation and wants to see Dr. Elisha Ponder 1st and has an appt 03/12/2020.

## 2020-03-11 DIAGNOSIS — D631 Anemia in chronic kidney disease: Secondary | ICD-10-CM | POA: Diagnosis not present

## 2020-03-11 DIAGNOSIS — Z9181 History of falling: Secondary | ICD-10-CM | POA: Diagnosis not present

## 2020-03-11 DIAGNOSIS — Z8616 Personal history of COVID-19: Secondary | ICD-10-CM | POA: Diagnosis not present

## 2020-03-11 DIAGNOSIS — Z89511 Acquired absence of right leg below knee: Secondary | ICD-10-CM | POA: Diagnosis not present

## 2020-03-11 DIAGNOSIS — Z993 Dependence on wheelchair: Secondary | ICD-10-CM | POA: Diagnosis not present

## 2020-03-11 DIAGNOSIS — Z7902 Long term (current) use of antithrombotics/antiplatelets: Secondary | ICD-10-CM | POA: Diagnosis not present

## 2020-03-11 DIAGNOSIS — B0229 Other postherpetic nervous system involvement: Secondary | ICD-10-CM | POA: Diagnosis not present

## 2020-03-11 DIAGNOSIS — Z8673 Personal history of transient ischemic attack (TIA), and cerebral infarction without residual deficits: Secondary | ICD-10-CM | POA: Diagnosis not present

## 2020-03-11 DIAGNOSIS — Z7982 Long term (current) use of aspirin: Secondary | ICD-10-CM | POA: Diagnosis not present

## 2020-03-11 DIAGNOSIS — R569 Unspecified convulsions: Secondary | ICD-10-CM | POA: Diagnosis not present

## 2020-03-11 DIAGNOSIS — E11649 Type 2 diabetes mellitus with hypoglycemia without coma: Secondary | ICD-10-CM | POA: Diagnosis not present

## 2020-03-11 DIAGNOSIS — Z794 Long term (current) use of insulin: Secondary | ICD-10-CM | POA: Diagnosis not present

## 2020-03-11 DIAGNOSIS — I129 Hypertensive chronic kidney disease with stage 1 through stage 4 chronic kidney disease, or unspecified chronic kidney disease: Secondary | ICD-10-CM | POA: Diagnosis not present

## 2020-03-11 DIAGNOSIS — E1151 Type 2 diabetes mellitus with diabetic peripheral angiopathy without gangrene: Secondary | ICD-10-CM | POA: Diagnosis not present

## 2020-03-11 DIAGNOSIS — N182 Chronic kidney disease, stage 2 (mild): Secondary | ICD-10-CM | POA: Diagnosis not present

## 2020-03-11 DIAGNOSIS — E1122 Type 2 diabetes mellitus with diabetic chronic kidney disease: Secondary | ICD-10-CM | POA: Diagnosis not present

## 2020-03-12 ENCOUNTER — Other Ambulatory Visit: Payer: Self-pay

## 2020-03-12 ENCOUNTER — Ambulatory Visit (INDEPENDENT_AMBULATORY_CARE_PROVIDER_SITE_OTHER): Payer: Medicare Other | Admitting: Podiatry

## 2020-03-12 ENCOUNTER — Ambulatory Visit: Payer: Medicare Other | Admitting: Podiatry

## 2020-03-12 ENCOUNTER — Encounter: Payer: Self-pay | Admitting: Podiatry

## 2020-03-12 VITALS — Temp 95.9°F

## 2020-03-12 DIAGNOSIS — L02612 Cutaneous abscess of left foot: Secondary | ICD-10-CM

## 2020-03-12 DIAGNOSIS — Z89511 Acquired absence of right leg below knee: Secondary | ICD-10-CM

## 2020-03-12 DIAGNOSIS — Z9181 History of falling: Secondary | ICD-10-CM

## 2020-03-12 DIAGNOSIS — L03032 Cellulitis of left toe: Secondary | ICD-10-CM

## 2020-03-12 DIAGNOSIS — E1151 Type 2 diabetes mellitus with diabetic peripheral angiopathy without gangrene: Secondary | ICD-10-CM

## 2020-03-12 LAB — TEST AUTHORIZATION

## 2020-03-12 LAB — BASIC METABOLIC PANEL
BUN/Creatinine Ratio: 17 (calc) (ref 6–22)
BUN: 25 mg/dL (ref 7–25)
CO2: 25 mmol/L (ref 20–32)
Calcium: 10.2 mg/dL (ref 8.6–10.4)
Chloride: 106 mmol/L (ref 98–110)
Creat: 1.49 mg/dL — ABNORMAL HIGH (ref 0.60–0.88)
Glucose, Bld: 58 mg/dL — ABNORMAL LOW (ref 65–99)
Potassium: 3.6 mmol/L (ref 3.5–5.3)
Sodium: 138 mmol/L (ref 135–146)

## 2020-03-12 LAB — CBC WITH DIFFERENTIAL/PLATELET
Absolute Monocytes: 872 cells/uL (ref 200–950)
Basophils Absolute: 64 cells/uL (ref 0–200)
Basophils Relative: 0.8 %
Eosinophils Absolute: 400 cells/uL (ref 15–500)
Eosinophils Relative: 5 %
HCT: 28.5 % — ABNORMAL LOW (ref 35.0–45.0)
Hemoglobin: 9.4 g/dL — ABNORMAL LOW (ref 11.7–15.5)
Lymphs Abs: 2456 cells/uL (ref 850–3900)
MCH: 32.1 pg (ref 27.0–33.0)
MCHC: 33 g/dL (ref 32.0–36.0)
MCV: 97.3 fL (ref 80.0–100.0)
MPV: 10.4 fL (ref 7.5–12.5)
Monocytes Relative: 10.9 %
Neutro Abs: 4208 cells/uL (ref 1500–7800)
Neutrophils Relative %: 52.6 %
Platelets: 282 10*3/uL (ref 140–400)
RBC: 2.93 10*6/uL — ABNORMAL LOW (ref 3.80–5.10)
RDW: 14 % (ref 11.0–15.0)
Total Lymphocyte: 30.7 %
WBC: 8 10*3/uL (ref 3.8–10.8)

## 2020-03-12 LAB — B12 AND FOLATE PANEL
Folate: 12.1 ng/mL
Vitamin B-12: 667 pg/mL (ref 200–1100)

## 2020-03-12 LAB — TEST AUTHORIZATION 2

## 2020-03-12 LAB — IRON,TIBC AND FERRITIN PANEL
%SAT: 23 % (calc) (ref 16–45)
Ferritin: 527 ng/mL — ABNORMAL HIGH (ref 16–288)
Iron: 57 ug/dL (ref 45–160)
TIBC: 251 mcg/dL (calc) (ref 250–450)

## 2020-03-12 NOTE — Progress Notes (Signed)
Subjective: Monique Cabrera presents today for follow up of follow up s/p cellulitis/abscess left 2nd digit. She states she has been following dressing change instructions. She relates no pain in digit, foot or limb on today's visit. She denies any fever, chills, night sweats, nausea or vomiting.   She feels the color of the foot is lighter on today's visit. She is still able to bear weight and transfer on the limb with no discomfort. She denies any symptoms of rest pain/claudication. She has a couple of Keflex capsules left to take. She has not had any adverse problems with them.  She received her prosthetic limb for the RLE on yesterday and is pleased with it. She will be having physical therapy assist her with gait training.   She also states she spoke to [facility] and is scheduled to see Dr. Donnetta Hutching for assessment of the left limb.   Allergies  Allergen Reactions  . Invokana [Canagliflozin] Itching, Swelling and Other (See Comments)    Vaginal itching, swelling and irritation  . Jardiance [Empagliflozin] Itching, Swelling and Other (See Comments)    Vaginal itching and swelling  . Tuberculin Ppd Other (See Comments)    Per [facility] from [facility]     Objective: Vitals:   03/12/20 1131  Temp: (!) 95.9 F (35.5 C)    Pt 85 y.o. year old AA female WD, WN in NAD. AAO x 3.   Vascular Examination:  Capillary refill time to digits immediate b/l. Nonpalpable pulses LLE. Capillary fill time to digits delayed. Pedal hair absent b/l. Foot remains warm. There is decreased edema of left foot on today's visit. There is no gangrene, no ischemia of digits.     Dermatological Examination: Pedal skin is thin shiny, atrophic bilaterally. Left 2nd toe nailbed healing. No erythema, no purulence, no drainage. Digit remains viable. No ischemia, no gangrene, no odor.  Musculoskeletal: Normal muscle strength 5/5 to all lower extremity muscle groups bilaterally, no pain  crepitus or joint limitation noted with ROM b/l and below knee amputation RLE. She does have mildly displaced fracture of left 1st metatarsal shaft. This is old injury and stable. She is not a candidate for repair due to her circulation.  Neurological: Protective sensation diminished with 10g monofilament b/l.  Assessment: 1. Cellulitis/abscess left 2nd digit, resolved 2. Status post fall (February 2021) 3. Status post BKA RLE 4.  NIDDM with PAD  Plan: -Left 2nd toe healing. Left 2nd digit nailbed cleansed with alcohol. Triple antibiotic ointment and band-aid applied. -She is to continue daily application of Mupirocin Ointment to digit. We again discussed her circulatory status.  -She has an old displaced fracture of 1st metatarsal shaft which is not amenable to repair due to circulatory status.  -I still would like her to see Dr. Donnetta Hutching since she had a fall in February. She has an appointment with Dr. Donnetta Hutching next Tuesday. I will see her again in 2 weeks for left 2nd digit.  -She was instructed should she notice any discoloration or coldness in digits of left foot or limb pain to call Dr. Luther Parody office immediately. She related understanding. -Patient to report any pedal injuries to medical professional immediately. -Patient/POA to call should there be question/concern in the interim.  Return in about 2 weeks (around 03/26/2020).

## 2020-03-12 NOTE — Patient Instructions (Addendum)
  KEEP APPOINTMENT WITH VASCULAR DOCTOR: DR. TODD EARLY   INSTRUCTIONS FOR LEFT 2ND TOE   1. CLEANSE LEFT 2ND TOE WITH SALINE.  2.  DAB DRY WITH GAUZE SPONGE.  3.  APPLY A LIGHT AMOUNT OF MUPIROCIN OINTMENT TO LEFT 2ND TOE NAILBED   4. APPLY /BAND-AID AS INSTRUCTED.  5.  DO NOT WALK BAREFOOT!!!  6.  IF YOU HAVE ANY DISCOLORATION OF DIGITS OF LEFT FOOT, PAIN IN LIMB, COLDNESS IN LIMB, CALL DR. EARLY'S OFFICE IMMEDIATELY.  7.   IF YOU EXPERIENCE ANY FEVER, CHILLS, NIGHTSWEATS, NAUSEA OR VOMITING, ELEVATED OR LOW BLOOD SUGARS, REPORT TO EMERGENCY ROOM.  8. IF YOU EXPERIENCE INCREASED REDNESS, PAIN, SWELLING, DISCOLORATION, ODOR, PUS, DRAINAGE OR WARMTH OF YOUR FOOT, REPORT TO EMERGENCY ROOM.

## 2020-03-13 DIAGNOSIS — Z993 Dependence on wheelchair: Secondary | ICD-10-CM | POA: Diagnosis not present

## 2020-03-13 DIAGNOSIS — Z7902 Long term (current) use of antithrombotics/antiplatelets: Secondary | ICD-10-CM | POA: Diagnosis not present

## 2020-03-13 DIAGNOSIS — Z7982 Long term (current) use of aspirin: Secondary | ICD-10-CM | POA: Diagnosis not present

## 2020-03-13 DIAGNOSIS — B0229 Other postherpetic nervous system involvement: Secondary | ICD-10-CM | POA: Diagnosis not present

## 2020-03-13 DIAGNOSIS — I129 Hypertensive chronic kidney disease with stage 1 through stage 4 chronic kidney disease, or unspecified chronic kidney disease: Secondary | ICD-10-CM | POA: Diagnosis not present

## 2020-03-13 DIAGNOSIS — E1122 Type 2 diabetes mellitus with diabetic chronic kidney disease: Secondary | ICD-10-CM | POA: Diagnosis not present

## 2020-03-13 DIAGNOSIS — Z89511 Acquired absence of right leg below knee: Secondary | ICD-10-CM | POA: Diagnosis not present

## 2020-03-13 DIAGNOSIS — D631 Anemia in chronic kidney disease: Secondary | ICD-10-CM | POA: Diagnosis not present

## 2020-03-13 DIAGNOSIS — Z8673 Personal history of transient ischemic attack (TIA), and cerebral infarction without residual deficits: Secondary | ICD-10-CM | POA: Diagnosis not present

## 2020-03-13 DIAGNOSIS — Z9181 History of falling: Secondary | ICD-10-CM | POA: Diagnosis not present

## 2020-03-13 DIAGNOSIS — E11649 Type 2 diabetes mellitus with hypoglycemia without coma: Secondary | ICD-10-CM | POA: Diagnosis not present

## 2020-03-13 DIAGNOSIS — Z8616 Personal history of COVID-19: Secondary | ICD-10-CM | POA: Diagnosis not present

## 2020-03-13 DIAGNOSIS — E1151 Type 2 diabetes mellitus with diabetic peripheral angiopathy without gangrene: Secondary | ICD-10-CM | POA: Diagnosis not present

## 2020-03-13 DIAGNOSIS — Z794 Long term (current) use of insulin: Secondary | ICD-10-CM | POA: Diagnosis not present

## 2020-03-13 DIAGNOSIS — R569 Unspecified convulsions: Secondary | ICD-10-CM | POA: Diagnosis not present

## 2020-03-13 DIAGNOSIS — N182 Chronic kidney disease, stage 2 (mild): Secondary | ICD-10-CM | POA: Diagnosis not present

## 2020-03-14 ENCOUNTER — Encounter: Payer: Medicare Other | Admitting: Physical Therapy

## 2020-03-14 ENCOUNTER — Other Ambulatory Visit: Payer: Self-pay | Admitting: *Deleted

## 2020-03-14 DIAGNOSIS — E1122 Type 2 diabetes mellitus with diabetic chronic kidney disease: Secondary | ICD-10-CM | POA: Diagnosis not present

## 2020-03-14 DIAGNOSIS — B0229 Other postherpetic nervous system involvement: Secondary | ICD-10-CM | POA: Diagnosis not present

## 2020-03-14 DIAGNOSIS — Z993 Dependence on wheelchair: Secondary | ICD-10-CM | POA: Diagnosis not present

## 2020-03-14 DIAGNOSIS — Z9181 History of falling: Secondary | ICD-10-CM | POA: Diagnosis not present

## 2020-03-14 DIAGNOSIS — Z7902 Long term (current) use of antithrombotics/antiplatelets: Secondary | ICD-10-CM | POA: Diagnosis not present

## 2020-03-14 DIAGNOSIS — E11649 Type 2 diabetes mellitus with hypoglycemia without coma: Secondary | ICD-10-CM | POA: Diagnosis not present

## 2020-03-14 DIAGNOSIS — Z7982 Long term (current) use of aspirin: Secondary | ICD-10-CM | POA: Diagnosis not present

## 2020-03-14 DIAGNOSIS — E1151 Type 2 diabetes mellitus with diabetic peripheral angiopathy without gangrene: Secondary | ICD-10-CM | POA: Diagnosis not present

## 2020-03-14 DIAGNOSIS — Z8616 Personal history of COVID-19: Secondary | ICD-10-CM | POA: Diagnosis not present

## 2020-03-14 DIAGNOSIS — Z8673 Personal history of transient ischemic attack (TIA), and cerebral infarction without residual deficits: Secondary | ICD-10-CM | POA: Diagnosis not present

## 2020-03-14 DIAGNOSIS — Z89511 Acquired absence of right leg below knee: Secondary | ICD-10-CM | POA: Diagnosis not present

## 2020-03-14 DIAGNOSIS — D631 Anemia in chronic kidney disease: Secondary | ICD-10-CM | POA: Diagnosis not present

## 2020-03-14 DIAGNOSIS — N182 Chronic kidney disease, stage 2 (mild): Secondary | ICD-10-CM | POA: Diagnosis not present

## 2020-03-14 DIAGNOSIS — Z794 Long term (current) use of insulin: Secondary | ICD-10-CM | POA: Diagnosis not present

## 2020-03-14 DIAGNOSIS — I70211 Atherosclerosis of native arteries of extremities with intermittent claudication, right leg: Secondary | ICD-10-CM

## 2020-03-14 DIAGNOSIS — I129 Hypertensive chronic kidney disease with stage 1 through stage 4 chronic kidney disease, or unspecified chronic kidney disease: Secondary | ICD-10-CM | POA: Diagnosis not present

## 2020-03-14 DIAGNOSIS — R569 Unspecified convulsions: Secondary | ICD-10-CM | POA: Diagnosis not present

## 2020-03-17 ENCOUNTER — Telehealth: Payer: Self-pay

## 2020-03-17 ENCOUNTER — Telehealth (HOSPITAL_COMMUNITY): Payer: Self-pay

## 2020-03-17 DIAGNOSIS — Z8616 Personal history of COVID-19: Secondary | ICD-10-CM | POA: Diagnosis not present

## 2020-03-17 DIAGNOSIS — I129 Hypertensive chronic kidney disease with stage 1 through stage 4 chronic kidney disease, or unspecified chronic kidney disease: Secondary | ICD-10-CM | POA: Diagnosis not present

## 2020-03-17 DIAGNOSIS — Z9181 History of falling: Secondary | ICD-10-CM | POA: Diagnosis not present

## 2020-03-17 DIAGNOSIS — Z7982 Long term (current) use of aspirin: Secondary | ICD-10-CM | POA: Diagnosis not present

## 2020-03-17 DIAGNOSIS — R569 Unspecified convulsions: Secondary | ICD-10-CM | POA: Diagnosis not present

## 2020-03-17 DIAGNOSIS — N182 Chronic kidney disease, stage 2 (mild): Secondary | ICD-10-CM | POA: Diagnosis not present

## 2020-03-17 DIAGNOSIS — Z993 Dependence on wheelchair: Secondary | ICD-10-CM | POA: Diagnosis not present

## 2020-03-17 DIAGNOSIS — E11649 Type 2 diabetes mellitus with hypoglycemia without coma: Secondary | ICD-10-CM | POA: Diagnosis not present

## 2020-03-17 DIAGNOSIS — Z794 Long term (current) use of insulin: Secondary | ICD-10-CM | POA: Diagnosis not present

## 2020-03-17 DIAGNOSIS — B0229 Other postherpetic nervous system involvement: Secondary | ICD-10-CM | POA: Diagnosis not present

## 2020-03-17 DIAGNOSIS — E1122 Type 2 diabetes mellitus with diabetic chronic kidney disease: Secondary | ICD-10-CM | POA: Diagnosis not present

## 2020-03-17 DIAGNOSIS — Z7902 Long term (current) use of antithrombotics/antiplatelets: Secondary | ICD-10-CM | POA: Diagnosis not present

## 2020-03-17 DIAGNOSIS — E1151 Type 2 diabetes mellitus with diabetic peripheral angiopathy without gangrene: Secondary | ICD-10-CM | POA: Diagnosis not present

## 2020-03-17 DIAGNOSIS — D631 Anemia in chronic kidney disease: Secondary | ICD-10-CM | POA: Diagnosis not present

## 2020-03-17 DIAGNOSIS — Z8673 Personal history of transient ischemic attack (TIA), and cerebral infarction without residual deficits: Secondary | ICD-10-CM | POA: Diagnosis not present

## 2020-03-17 DIAGNOSIS — Z89511 Acquired absence of right leg below knee: Secondary | ICD-10-CM | POA: Diagnosis not present

## 2020-03-17 NOTE — Telephone Encounter (Signed)
Monique Cabrera from [facility] asked that papers from appointment March 11th, 2021 for this patient be faxed over. Papers Faxed.

## 2020-03-17 NOTE — Telephone Encounter (Signed)

## 2020-03-18 ENCOUNTER — Ambulatory Visit (HOSPITAL_COMMUNITY)
Admission: RE | Admit: 2020-03-18 | Discharge: 2020-03-18 | Disposition: A | Discharge status: Home / Self Care | Payer: Medicare Other | Source: Ambulatory Visit | Attending: Vascular Surgery | Admitting: Vascular Surgery

## 2020-03-18 ENCOUNTER — Other Ambulatory Visit: Payer: Self-pay

## 2020-03-18 ENCOUNTER — Ambulatory Visit (INDEPENDENT_AMBULATORY_CARE_PROVIDER_SITE_OTHER): Payer: Medicare Other | Admitting: Vascular Surgery

## 2020-03-18 ENCOUNTER — Encounter: Payer: Self-pay | Admitting: Vascular Surgery

## 2020-03-18 VITALS — BP 163/72 | HR 67 | Temp 97.2°F | Resp 20 | Ht 65.0 in | Wt 123.0 lb

## 2020-03-18 DIAGNOSIS — Z89511 Acquired absence of right leg below knee: Secondary | ICD-10-CM | POA: Diagnosis not present

## 2020-03-18 DIAGNOSIS — Z9181 History of falling: Secondary | ICD-10-CM | POA: Diagnosis not present

## 2020-03-18 DIAGNOSIS — Z7982 Long term (current) use of aspirin: Secondary | ICD-10-CM | POA: Diagnosis not present

## 2020-03-18 DIAGNOSIS — D631 Anemia in chronic kidney disease: Secondary | ICD-10-CM | POA: Diagnosis not present

## 2020-03-18 DIAGNOSIS — N182 Chronic kidney disease, stage 2 (mild): Secondary | ICD-10-CM | POA: Diagnosis not present

## 2020-03-18 DIAGNOSIS — Z794 Long term (current) use of insulin: Secondary | ICD-10-CM | POA: Diagnosis not present

## 2020-03-18 DIAGNOSIS — I129 Hypertensive chronic kidney disease with stage 1 through stage 4 chronic kidney disease, or unspecified chronic kidney disease: Secondary | ICD-10-CM | POA: Diagnosis not present

## 2020-03-18 DIAGNOSIS — R569 Unspecified convulsions: Secondary | ICD-10-CM | POA: Diagnosis not present

## 2020-03-18 DIAGNOSIS — Z993 Dependence on wheelchair: Secondary | ICD-10-CM | POA: Diagnosis not present

## 2020-03-18 DIAGNOSIS — E1122 Type 2 diabetes mellitus with diabetic chronic kidney disease: Secondary | ICD-10-CM | POA: Diagnosis not present

## 2020-03-18 DIAGNOSIS — Z8673 Personal history of transient ischemic attack (TIA), and cerebral infarction without residual deficits: Secondary | ICD-10-CM | POA: Diagnosis not present

## 2020-03-18 DIAGNOSIS — I70211 Atherosclerosis of native arteries of extremities with intermittent claudication, right leg: Secondary | ICD-10-CM | POA: Diagnosis not present

## 2020-03-18 DIAGNOSIS — Z8616 Personal history of COVID-19: Secondary | ICD-10-CM | POA: Diagnosis not present

## 2020-03-18 DIAGNOSIS — Z7902 Long term (current) use of antithrombotics/antiplatelets: Secondary | ICD-10-CM | POA: Diagnosis not present

## 2020-03-18 DIAGNOSIS — B0229 Other postherpetic nervous system involvement: Secondary | ICD-10-CM | POA: Diagnosis not present

## 2020-03-18 DIAGNOSIS — E1151 Type 2 diabetes mellitus with diabetic peripheral angiopathy without gangrene: Secondary | ICD-10-CM | POA: Diagnosis not present

## 2020-03-18 DIAGNOSIS — E11649 Type 2 diabetes mellitus with hypoglycemia without coma: Secondary | ICD-10-CM | POA: Diagnosis not present

## 2020-03-18 NOTE — Progress Notes (Signed)
[facility] [place]  Patient name: Monique Cabrera MRN: [id number] DOB: May 09, 1935 Sex: female  REASON FOR VISIT: Evaluation of infection left second toe nailbed  HPI: Monique Cabrera is a 85 y.o. female well-known to me from prior evaluation.  She is a very pleasant 85 year old.  She had presented in November with nonsalvageable right foot.  She had undergone arteriography with Dr. Fletcher Anon.  She underwent an uneventful right below-knee amputation and is done extremely well from this.  Recently she has had episodes of ulceration over the second toe nail bed and has been appropriately treated for this.  She also tells me that she had a fracture in the bones of her foot and her arch and this has been treated nonoperatively.  She is walking with her prosthesis with help.  She continues rehabilitation with this.  Past Medical History:  Diagnosis Date  . Acute upper respiratory infections of unspecified site   . Amputee 08/2019  . Anemia   . Anemia, unspecified   . Atherosclerosis of native arteries of the extremities, unspecified   . Chest pain, unspecified   . Chronic kidney disease (CKD), stage II (mild)   . Diarrhea   . Disorder of bone and cartilage, unspecified   . Herpes zoster with other nervous system complications(053.19)   . Hypercalcemia   . Hypertension   . Hypertensive renal disease, benign   . Nonspecific reaction to tuberculin skin test without active tuberculosis(795.51)   . Other and unspecified hyperlipidemia   . PAD (peripheral artery disease) ([place])    Per records from [facility]  . Pain in joint, lower leg   . Peripheral arterial disease ([place])   . Postherpetic neuralgia   . Proteinuria   . Stroke ([place]) 01/2017  . Type II or unspecified type diabetes mellitus with renal manifestations, uncontrolled(250.42)   . Unspecified disorder of kidney and ureter     Family History  Problem Relation Age of Onset    . Diabetes Mother   . Diabetes Father   . Diabetes Sister   . Diabetes Sister     SOCIAL HISTORY: Social History   Tobacco Use  . Smoking status: Former Smoker    Types: Cigarettes  . Smokeless tobacco: Never Used  . Tobacco comment: Quit about age 59   Substance Use Topics  . Alcohol use: No    Alcohol/week: 0.0 standard drinks    Allergies  Allergen Reactions  . Invokana [Canagliflozin] Itching, Swelling and Other (See Comments)    Vaginal itching, swelling and irritation  . Jardiance [Empagliflozin] Itching, Swelling and Other (See Comments)    Vaginal itching and swelling  . Tuberculin Ppd Other (See Comments)    Per records from [name]     Current Outpatient Medications  Medication Sig Dispense Refill  . amLODipine (NORVASC) 10 MG tablet TAKE 1 TABLET (10 MG TOTAL) BY MOUTH DAILY. FOR HIGH BLOOD PRESSURE 90 tablet 1  . aspirin 81 MG chewable tablet Chew 1 tablet (81 mg total) by mouth daily. 90 tablet 3  . ASPIRIN LOW DOSE 81 MG EC tablet Take 81 mg by mouth daily.    Marland Kitchen atorvastatin (LIPITOR) 20 MG tablet TAKE 1 TABLET BY MOUTH EVERY DAY 90 tablet 1  . carvedilol (COREG) 25 MG tablet TAKE 1 TABLET BY MOUTH TWICE A DAY WITH MEALS 180 tablet 1  . clopidogrel (PLAVIX) 75 MG tablet TAKE 1 TABLET BY MOUTH EVERY DAY 90 tablet 1  . feeding  supplement, ENSURE ENLIVE, (ENSURE ENLIVE) LIQD Take 237 mLs by mouth 3 (three) times daily between meals. 237 mL 12  . feeding supplement, GLUCERNA SHAKE, (GLUCERNA SHAKE) LIQD Take 237 mLs by mouth 3 (three) times daily between meals.  0  . gabapentin (NEURONTIN) 100 MG capsule Take 100 mg by mouth 3 (three) times daily.    Marland Kitchen glucose 4 GM chewable tablet Chew 1 tablet (4 g total) by mouth as needed for low blood sugar. 50 tablet 12  . Insulin Pen Needle (B-D ULTRAFINE III SHORT PEN) 31G X 8 MM MISC Use to check blood sugar every day. Dx: 11.29; 11.65 100 each 1  . LANTUS SOLOSTAR 100 UNIT/ML Solostar Pen Inject 20 Units into the  skin at bedtime.    . levETIRAcetam (KEPPRA) 250 MG tablet Take 1 tablet (250 mg total) by mouth 2 (two) times daily.    . mupirocin ointment (BACTROBAN) 2 % Apply to left 2nd toe once daily. 30 g 1  . ONETOUCH VERIO test strip USE TO TEST BLOOD SUGAR THREE TIMES DAILY. DX: E11.9 300 strip 3   No current facility-administered medications for this visit.    REVIEW OF SYSTEMS:  [X]  denotes positive finding, [ ]  denotes negative finding Cardiac  Comments:  Chest pain or chest pressure:    Shortness of breath upon exertion:    Short of breath when lying flat:    Irregular heart rhythm:        Vascular    Pain in calf, thigh, or hip brought on by ambulation:    Pain in feet at night that wakes you up from your sleep:     Blood clot in your veins:    Leg swelling:           PHYSICAL EXAM: Vitals:   03/18/20 1057  BP: (!) 163/72  Pulse: 67  Resp: 20  Temp: (!) 97.2 F (36.2 C)  SpO2: 100%  Weight: 123 lb (55.8 kg)  Height: 5\' 5"  (1.651 m)    GENERAL: The patient is a well-nourished female, in no acute distress. The vital signs are documented above. CARDIOVASCULAR: She does not have palpable left popliteal pulse or pedal pulse. PULMONARY: There is good air exchange  MUSCULOSKELETAL: There are no major deformities or cyanosis.  Well-healed right below-knee amputation NEUROLOGIC: No focal weakness or paresthesias are detected. SKIN: There are no ulcers or rashes noted.  Her left second toenail bed is not draining with no evidence of infection currently PSYCHIATRIC: The patient has a normal affect.  DATA:  Noninvasive studies today reveal noncompressible vessels in her left tibial.  She has monophasic flow  I reviewed her former arteriogram from 10/30/2019.  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  She has single runoff via a diseased anterior tibial artery  MEDICAL ISSUES: I  discussed the significance of this in detail with the patient.  She apparently has going on to heal the ulceration over her left second toenail bed.  Obviously she is concerned regarding the outcome on her right foot.  I explained that lifelong she will have to be extremely vigilant and bring attention to any ulceration to saw her other providers.  If she has any progressive changes will require revascularization for left leg limb salvage.  She understands and will see [place] as needed    Rosetta Posner, MD FACS [name] Tel [phone])  [phone] Pager [phone]

## 2020-03-19 ENCOUNTER — Ambulatory Visit: Payer: Medicare Other | Admitting: Podiatry

## 2020-03-19 DIAGNOSIS — Z9181 History of falling: Secondary | ICD-10-CM | POA: Diagnosis not present

## 2020-03-19 DIAGNOSIS — Z8673 Personal history of transient ischemic attack (TIA), and cerebral infarction without residual deficits: Secondary | ICD-10-CM | POA: Diagnosis not present

## 2020-03-19 DIAGNOSIS — Z8616 Personal history of COVID-19: Secondary | ICD-10-CM | POA: Diagnosis not present

## 2020-03-19 DIAGNOSIS — E1151 Type 2 diabetes mellitus with diabetic peripheral angiopathy without gangrene: Secondary | ICD-10-CM | POA: Diagnosis not present

## 2020-03-19 DIAGNOSIS — Z89511 Acquired absence of right leg below knee: Secondary | ICD-10-CM | POA: Diagnosis not present

## 2020-03-19 DIAGNOSIS — N182 Chronic kidney disease, stage 2 (mild): Secondary | ICD-10-CM | POA: Diagnosis not present

## 2020-03-19 DIAGNOSIS — Z7982 Long term (current) use of aspirin: Secondary | ICD-10-CM | POA: Diagnosis not present

## 2020-03-19 DIAGNOSIS — Z794 Long term (current) use of insulin: Secondary | ICD-10-CM | POA: Diagnosis not present

## 2020-03-19 DIAGNOSIS — R569 Unspecified convulsions: Secondary | ICD-10-CM | POA: Diagnosis not present

## 2020-03-19 DIAGNOSIS — Z7902 Long term (current) use of antithrombotics/antiplatelets: Secondary | ICD-10-CM | POA: Diagnosis not present

## 2020-03-19 DIAGNOSIS — Z993 Dependence on wheelchair: Secondary | ICD-10-CM | POA: Diagnosis not present

## 2020-03-19 DIAGNOSIS — I129 Hypertensive chronic kidney disease with stage 1 through stage 4 chronic kidney disease, or unspecified chronic kidney disease: Secondary | ICD-10-CM | POA: Diagnosis not present

## 2020-03-19 DIAGNOSIS — E11649 Type 2 diabetes mellitus with hypoglycemia without coma: Secondary | ICD-10-CM | POA: Diagnosis not present

## 2020-03-19 DIAGNOSIS — D631 Anemia in chronic kidney disease: Secondary | ICD-10-CM | POA: Diagnosis not present

## 2020-03-19 DIAGNOSIS — B0229 Other postherpetic nervous system involvement: Secondary | ICD-10-CM | POA: Diagnosis not present

## 2020-03-19 DIAGNOSIS — E1122 Type 2 diabetes mellitus with diabetic chronic kidney disease: Secondary | ICD-10-CM | POA: Diagnosis not present

## 2020-03-20 DIAGNOSIS — Z993 Dependence on wheelchair: Secondary | ICD-10-CM | POA: Diagnosis not present

## 2020-03-20 DIAGNOSIS — R569 Unspecified convulsions: Secondary | ICD-10-CM | POA: Diagnosis not present

## 2020-03-20 DIAGNOSIS — Z794 Long term (current) use of insulin: Secondary | ICD-10-CM | POA: Diagnosis not present

## 2020-03-20 DIAGNOSIS — D631 Anemia in chronic kidney disease: Secondary | ICD-10-CM | POA: Diagnosis not present

## 2020-03-20 DIAGNOSIS — I129 Hypertensive chronic kidney disease with stage 1 through stage 4 chronic kidney disease, or unspecified chronic kidney disease: Secondary | ICD-10-CM | POA: Diagnosis not present

## 2020-03-20 DIAGNOSIS — Z7902 Long term (current) use of antithrombotics/antiplatelets: Secondary | ICD-10-CM | POA: Diagnosis not present

## 2020-03-20 DIAGNOSIS — Z7982 Long term (current) use of aspirin: Secondary | ICD-10-CM | POA: Diagnosis not present

## 2020-03-20 DIAGNOSIS — E11649 Type 2 diabetes mellitus with hypoglycemia without coma: Secondary | ICD-10-CM | POA: Diagnosis not present

## 2020-03-20 DIAGNOSIS — Z9181 History of falling: Secondary | ICD-10-CM | POA: Diagnosis not present

## 2020-03-20 DIAGNOSIS — N182 Chronic kidney disease, stage 2 (mild): Secondary | ICD-10-CM | POA: Diagnosis not present

## 2020-03-20 DIAGNOSIS — B0229 Other postherpetic nervous system involvement: Secondary | ICD-10-CM | POA: Diagnosis not present

## 2020-03-20 DIAGNOSIS — E1151 Type 2 diabetes mellitus with diabetic peripheral angiopathy without gangrene: Secondary | ICD-10-CM | POA: Diagnosis not present

## 2020-03-20 DIAGNOSIS — Z8673 Personal history of transient ischemic attack (TIA), and cerebral infarction without residual deficits: Secondary | ICD-10-CM | POA: Diagnosis not present

## 2020-03-20 DIAGNOSIS — Z8616 Personal history of COVID-19: Secondary | ICD-10-CM | POA: Diagnosis not present

## 2020-03-20 DIAGNOSIS — E1122 Type 2 diabetes mellitus with diabetic chronic kidney disease: Secondary | ICD-10-CM | POA: Diagnosis not present

## 2020-03-20 DIAGNOSIS — Z89511 Acquired absence of right leg below knee: Secondary | ICD-10-CM | POA: Diagnosis not present

## 2020-03-21 ENCOUNTER — Ambulatory Visit: Payer: Medicare Other | Admitting: Podiatry

## 2020-03-21 DIAGNOSIS — E11649 Type 2 diabetes mellitus with hypoglycemia without coma: Secondary | ICD-10-CM | POA: Diagnosis not present

## 2020-03-21 DIAGNOSIS — N182 Chronic kidney disease, stage 2 (mild): Secondary | ICD-10-CM | POA: Diagnosis not present

## 2020-03-21 DIAGNOSIS — Z7982 Long term (current) use of aspirin: Secondary | ICD-10-CM | POA: Diagnosis not present

## 2020-03-21 DIAGNOSIS — Z89511 Acquired absence of right leg below knee: Secondary | ICD-10-CM | POA: Diagnosis not present

## 2020-03-21 DIAGNOSIS — Z7902 Long term (current) use of antithrombotics/antiplatelets: Secondary | ICD-10-CM | POA: Diagnosis not present

## 2020-03-21 DIAGNOSIS — Z8673 Personal history of transient ischemic attack (TIA), and cerebral infarction without residual deficits: Secondary | ICD-10-CM | POA: Diagnosis not present

## 2020-03-21 DIAGNOSIS — Z8616 Personal history of COVID-19: Secondary | ICD-10-CM | POA: Diagnosis not present

## 2020-03-21 DIAGNOSIS — Z9181 History of falling: Secondary | ICD-10-CM | POA: Diagnosis not present

## 2020-03-21 DIAGNOSIS — I129 Hypertensive chronic kidney disease with stage 1 through stage 4 chronic kidney disease, or unspecified chronic kidney disease: Secondary | ICD-10-CM | POA: Diagnosis not present

## 2020-03-21 DIAGNOSIS — R569 Unspecified convulsions: Secondary | ICD-10-CM | POA: Diagnosis not present

## 2020-03-21 DIAGNOSIS — E1151 Type 2 diabetes mellitus with diabetic peripheral angiopathy without gangrene: Secondary | ICD-10-CM | POA: Diagnosis not present

## 2020-03-21 DIAGNOSIS — B0229 Other postherpetic nervous system involvement: Secondary | ICD-10-CM | POA: Diagnosis not present

## 2020-03-21 DIAGNOSIS — E1122 Type 2 diabetes mellitus with diabetic chronic kidney disease: Secondary | ICD-10-CM | POA: Diagnosis not present

## 2020-03-21 DIAGNOSIS — Z794 Long term (current) use of insulin: Secondary | ICD-10-CM | POA: Diagnosis not present

## 2020-03-21 DIAGNOSIS — Z993 Dependence on wheelchair: Secondary | ICD-10-CM | POA: Diagnosis not present

## 2020-03-21 DIAGNOSIS — D631 Anemia in chronic kidney disease: Secondary | ICD-10-CM | POA: Diagnosis not present

## 2020-03-26 ENCOUNTER — Ambulatory Visit: Payer: Medicare Other | Attending: Vascular Surgery | Admitting: Physical Therapy

## 2020-03-26 ENCOUNTER — Encounter: Payer: Self-pay | Admitting: Adult Health

## 2020-03-26 ENCOUNTER — Ambulatory Visit (INDEPENDENT_AMBULATORY_CARE_PROVIDER_SITE_OTHER): Payer: Medicare Other | Admitting: Adult Health

## 2020-03-26 ENCOUNTER — Other Ambulatory Visit: Payer: Self-pay

## 2020-03-26 ENCOUNTER — Encounter: Payer: Self-pay | Admitting: Physical Therapy

## 2020-03-26 ENCOUNTER — Encounter: Payer: Medicare Other | Admitting: Physical Therapy

## 2020-03-26 VITALS — BP 156/72 | HR 66 | Temp 96.8°F | Ht 65.0 in | Wt 126.6 lb

## 2020-03-26 DIAGNOSIS — Z8673 Personal history of transient ischemic attack (TIA), and cerebral infarction without residual deficits: Secondary | ICD-10-CM | POA: Diagnosis not present

## 2020-03-26 DIAGNOSIS — R2689 Other abnormalities of gait and mobility: Secondary | ICD-10-CM | POA: Insufficient documentation

## 2020-03-26 DIAGNOSIS — R531 Weakness: Secondary | ICD-10-CM | POA: Diagnosis not present

## 2020-03-26 DIAGNOSIS — Z9181 History of falling: Secondary | ICD-10-CM | POA: Insufficient documentation

## 2020-03-26 DIAGNOSIS — R2681 Unsteadiness on feet: Secondary | ICD-10-CM | POA: Diagnosis not present

## 2020-03-26 DIAGNOSIS — G8384 Todd's paralysis (postepileptic): Secondary | ICD-10-CM | POA: Diagnosis not present

## 2020-03-26 DIAGNOSIS — R293 Abnormal posture: Secondary | ICD-10-CM | POA: Diagnosis not present

## 2020-03-26 DIAGNOSIS — R569 Unspecified convulsions: Secondary | ICD-10-CM | POA: Diagnosis not present

## 2020-03-26 NOTE — Therapy (Signed)
[facility] [address] [place], [place], [address] Phone: [phone]   Fax:  [phone]  Physical Therapy Evaluation  Patient Details  Name: Monique Cabrera MRN: [id number] Date of Birth: December 28, 1934 Referring Provider (PT): Curt Jews, MD   Encounter Date: 03/26/2020  PT End of Session - 03/26/20 1303    Visit Number  1    Number of Visits  25    Date for PT Re-Evaluation  06/24/20    Authorization Type  [employer] Medicare & Medicaid    PT Start Time  1115    PT Stop Time  1218    PT Time Calculation (min)  63 min       Past Medical History:  Diagnosis Date  . Acute upper respiratory infections of unspecified site   . Amputee 08/2019  . Anemia   . Anemia, unspecified   . Atherosclerosis of native arteries of the extremities, unspecified   . Chest pain, unspecified   . Chronic kidney disease (CKD), stage II (mild)   . Diarrhea   . Disorder of bone and cartilage, unspecified   . Herpes zoster with other nervous system complications(053.19)   . Hypercalcemia   . Hypertension   . Hypertensive renal disease, benign   . Nonspecific reaction to tuberculin skin test without active tuberculosis(795.51)   . Other and unspecified hyperlipidemia   . PAD (peripheral artery disease) ([place])    Per records from [name]  . Pain in joint, lower leg   . Peripheral arterial disease ([place])   . Postherpetic neuralgia   . Proteinuria   . Stroke ([place]) 01/2017  . Type II or unspecified type diabetes mellitus with renal manifestations, uncontrolled(250.42)   . Unspecified disorder of kidney and ureter     Past Surgical History:  Procedure Laterality Date  . ABDOMINAL AORTOGRAM W/LOWER EXTREMITY N/A 10/31/2019   Procedure: ABDOMINAL AORTOGRAM W/LOWER EXTREMITY;  Surgeon: Wellington Hampshire, MD;  Location: [facility];  Service: Cardiovascular;  Laterality: N/A;  . AMPUTATION Right 11/02/2019   Procedure: AMPUTATION  BELOW KNEE RIGHT;  Surgeon: Rosetta Posner, MD;  Location: [facility];  Service: Vascular;  Laterality: Right;  . hysterectomy    . INCISION AND DRAINAGE Left 05/27/14   sebacous cyst, ear  . PRP Left    Dr. Ricki Miller  . removal of cyst from hand    . removal of tumor from foot    . TONSILLECTOMY      There were no vitals filed for this visit.   Subjective Assessment - 03/26/20 1120    Subjective  [name] was referred on 02/04/2020 with Right BKA by Curt Jews, MD. She underwent a right Transtibial Amputation on 11/02/2019 due to gangrene vascular issues. She was admitted 02/08/20-02/14/20 with right side weakness Todds paresis probably related to seizures. She recieved prosthesis 03/11/2020.    Limitations  Lifting;Standing;Walking;House hold activities    Patient Stated Goals  To use prosthesis to walk without device, take care of myself, live alone. Go out in community.    Currently in Pain?  No/denies         [name] PT Assessment - 03/26/20 1115      Assessment   Medical Diagnosis  Right Transtibial Amputation    Referring Provider (PT)  Curt Jews, MD    Onset Date/Surgical Date  03/11/20   prosthesis delivery   Hand Dominance  Right    Prior Therapy  HHPT discharged on 03/21/2020  Precautions   Precautions  Fall      Balance Screen   Has the patient fallen in the past 6 months  Yes    How many times?  7   forgets leg not there   Has the patient had a decrease in activity level because of a fear of falling?   No    Is the patient reluctant to leave their home because of a fear of falling?   No   uses w/c     Home Environment   Living Environment  Private residence    [place]   son 46yo, she lived alone   Type of [place] to enter   goes around steps on grass   Entrance Stairs-Number of Steps  5    Entrance Stairs-Rails  --   one center [place]  One level    [place] - 2 wheels;Walker  - 4 wheels;Cane - single point;Bedside commode;Shower seat;Wheelchair - manual      Prior Function   Level of Independence  Independent with household mobility without device;Independent with community mobility without device   lived alone   Vocation  Retired    Leisure  being outside, gardening       Posture/Postural Control   Posture/Postural Control  Postural limitations    Postural Limitations  Forward head;Increased lumbar lordosis;Flexed trunk;Weight shift left      ROM / Strength   AROM / PROM / Strength  PROM;Strength      PROM   Overall PROM   Within functional limits for tasks performed    Overall PROM Comments  gross assessment sitting & standing WFL      Strength   Overall Strength  Deficits    Overall Strength Comments  Gross MMT in sitting & standing - BUEs WFL, Bil. hips 3-4/5, right knee 4/5, left ankle DF 4/5      Transfers   Transfers  Sit to Stand;Stand to Sit    Sit to Stand  5: Supervision;With upper extremity assist;With armrests;From chair/3-in-1;Other (comment)   requires RW to stabilize   Stand to Sit  5: Supervision;With upper extremity assist;With armrests;To chair/3-in-1;Other (comment)   requires RW for stability     Ambulation/Gait   Ambulation/Gait  Yes    Ambulation/Gait Assistance  4: Min assist   MinA to stabilize right knee   Ambulation/Gait Assistance Details  excessive UE wt bearing on RW, PT demo, verbal & manual cues on foot flat in midstance with wt shift over prosthesis.     Ambulation Distance (Feet)  70 Feet   70' X 2   Assistive device  Rolling walker;Prosthesis    Gait Pattern  Step-to pattern;Decreased step length - left;Decreased stance time - right;Decreased stride length;Decreased weight shift to right;Right hip hike;Right flexed knee in stance;Right foot flat;Right genu recurvatum;Antalgic;Lateral hip instability;Trunk flexed;Abducted- right   LLE heel rotated under midline   Ambulation Surface  Indoor;Level    Gait velocity   0.72 ft/sec      Standardized Balance Assessment   Standardized Balance Assessment  Berg Balance Test      Berg Balance Test   Sit to Stand  Needs minimal aid to stand or to stabilize   needs RW to stabilize   Standing Unsupported  Needs several tries to stand 30 seconds unsupported   MinA for stabilization   Sitting with Back Unsupported but Feet Supported on  Floor or Stool  Able to sit safely and securely 2 minutes    Stand to Sit  Controls descent by using hands    Transfers  Able to transfer safely, definite need of hands    Standing Unsupported with Eyes Closed  Needs help to keep from falling   MinA to prevent fall within 2 seconds    Standing Unsupported with Feet Together  Needs help to attain position and unable to hold for 15 seconds   RW to get in position & minA as released for balance   From Standing, Reach Forward with Outstretched Arm  Loses balance while trying/requires external support   MinA to reach 2"   From Standing Position, Pick up Object from Floor  Unable to try/needs assist to keep balance   MinA for balance but can touch floor   From Standing Position, Turn to Look Behind Over each Shoulder  Needs assist to keep from losing balance and falling   cervical motion only, minA for balance   Turn 360 Degrees  Needs assistance while turning    Standing Unsupported, Alternately Place Feet on Step/Stool  Needs assistance to keep from falling or unable to try    Standing Unsupported, One Foot in Front  Loses balance while stepping or standing    Standing on One Leg  Unable to try or needs assist to prevent fall   MinA to attempt to lift prosthetic foot   Total Score  12      Prosthetics Assessment - 03/26/20 1115      Prosthetics   Prosthetic Care Dependent with  Skin check;Residual limb care;Care of non-amputated limb;Prosthetic cleaning;Ply sock cleaning;Correct ply sock adjustment;Proper weight-bearing schedule/adjustment;Proper wear schedule/adjustment     Prosthetic Care Comments   PT applied & instructed in use of Tegaderm for wound to donne prior to 1st wear & remove after 2nd wear.  PT recommended prosthetic wear 2hrs 2x/day.     dark wound on distal 2nd toe of LLE   Donning prosthesis   Supervision    Doffing prosthesis   Supervision    Current prosthetic wear tolerance (days/week)   ~9 of 15 days since delivery    Current prosthetic wear tolerance (#hours/day)   30 minutes 1-3 x/day     Current prosthetic weight-bearing tolerance (hours/day)   Pt tolerated standing & gait for 5 minutes with no c/o pain or limb discomfort    Edema  none    Residual limb condition   80mm wound on tibial tuberosity with moist scab, no signs of infection (she reports from crawling on knees), normal color & temperature, mild dry skin, no hair growth, cylinerical shape     Prosthesis Description  silicon liner with shuttle pin lock suspension, 3-ply fit at eval (1-ply sock upon arrival), K2 flexible keel foot    K code/activity level with prosthetic use   K3 full community with variable cadence                Objective measurements completed on examination: See above findings.      [facility] Adult PT Treatment/Exercise - 03/26/20 1115      Prosthetics   Education Provided  Skin check;Residual limb care;Prosthetic cleaning;Correct ply sock adjustment;Proper Donning;Proper wear schedule/adjustment;Other (comment)   See prosthetic care comments   Person(s) Educated  Patient    Education Method  Explanation;Demonstration;Tactile cues;Verbal cues    Education Method  Verbalized understanding;Returned demonstration;Tactile cues required;Verbal cues required;Needs further instruction  PT Education - 03/26/20 1305    Education Details  PT gave copy of [employer] & info on [employer]    Person(s) Educated  Patient    Methods  Explanation;Verbal cues    Comprehension  Verbalized understanding       PT Short Term Goals - 03/26/20 1321       PT SHORT TERM GOAL #1   Title  Patient demonstrates proper donning & verbalizes proper cleaning of prosthesis. (All STGs Target Date: 04/25/2020)    Time  1    Period  Months    Status  New    Target Date  03/25/20      PT SHORT TERM GOAL #2   Title  Patient tolerates wear of prosthesis >8hrs total / day without wound increasing.    Time  1    Period  Months    Status  New    Target Date  03/25/20      PT SHORT TERM GOAL #3   Title  Standing balance tasks with intermittent UE support on RW reaching 10" anteriorly, picking up items from floor, scanning environment & managing pants for toileting with supervision.    Time  1    Period  Months    Status  New    Target Date  03/25/20      PT SHORT TERM GOAL #4   Title  Patient ambulates 200' with RW & prosthesis with verbal cues for gait deviations & no physical assist or balance loss.    Time  1    Period  Months    Status  New    Target Date  03/25/20      PT SHORT TERM GOAL #5   Title  Patient negotiates stairs with single rail, ramps & curbs with RW & prosthesis with minA.    Time  1    Period  Months    Status  New    Target Date  03/25/20        PT Long Term Goals - 03/26/20 1313      PT LONG TERM GOAL #1   Title  Patient verbalizes & demonstrates understanding of prosthetic care & diabetic foot care to enable safe utilization. (All LTGs Target Date: 06/24/2020)    Time  3    Period  Months    Status  New    Target Date  06/24/20      PT LONG TERM GOAL #2   Title  Patient tolerates prosthesis wear >90% of awake hours without skin or residual limb pain to enable function throughout her day.    Time  3    Period  Months    Status  New    Target Date  06/24/20      PT LONG TERM GOAL #3   Title  Berg Balance >/= 45/56 to indicate lower fall risk.    Time  3    Period  Months    Status  New    Target Date  06/24/20      PT LONG TERM GOAL #4   Title  Patient ambulates 500' outdoors including grass with  cane or less & prosthesis modified independent to enable community mobility.    Time  3    Period  Months    Status  New    Target Date  06/24/20      PT LONG TERM GOAL #5   Title  Patient negotiates ramps, curbs & stairs with single  rail with cane or less & prosthesis modified independent to enable community access.    Time  3    Period  Months    Status  New    Target Date  06/24/20      Additional Long Term Goals   Additional Long Term Goals  Yes      PT LONG TERM GOAL #6   Title  Patient demonstrates skills to enable her to return to gardening including kneeling with UE support like garden kneel bench or chair.    Time  3    Period  Months    Status  New    Target Date  06/24/20             Plan - 03/26/20 1306    Clinical Impression Statement  [name] underwent a right Transtibial Amputation on 11/02/2019 following 3 months limited mobility with wound and recieved her first prosthesis on 03/11/2020 (15 days prior to PT eval). She has weakness with deconditioning from prolonged limited mobility. She is dependent in proper prosthetic care & use. She is wearing prosthesis 9 of 15 days since delivery and only 30 min at time which limits function during her day.  She has high fall risk as noted by [employer] 12/56 and need for minimal assist for basic balance task.  Her prosthetic gait has significant gait deviations including excessive UE weight bearing and gait velocity of 0.72 ft/sec with rolling walker. Patient would benefit from skilled care to improve function & safety with mobility & prosthesis use.    Personal Factors and Comorbidities  Age;Comorbidity 3+;Fitness;Time since onset of injury/illness/exacerbation;Transportation    Comorbidities  Right TTA, HTN, DM, CVA, PAD, HLD, CKD st2,    Examination-Activity Limitations  Locomotion Level;Stairs;Stand;Transfers;Toileting    Examination-Participation Restrictions  Community Activity;Yard Work;Shop     Stability/Clinical Decision Making  Evolving/Moderate complexity    Clinical Decision Making  Moderate    Rehab Potential  Good    PT Frequency  2x / week    PT Duration  12 weeks    PT Treatment/Interventions  ADLs/Self Care Home Management;DME Instruction;Gait training;Stair training;Functional mobility training;Therapeutic activities;Therapeutic exercise;Balance training;Neuromuscular re-education;Patient/family education;Prosthetic Training;Vestibular    PT Next Visit Plan  review prosthetic care, Initial HEP at sink for balance/proprioception, prosthetic gait with RW    Consulted and Agree with Plan of Care  Patient       Patient will benefit from skilled therapeutic intervention in order to improve the following deficits and impairments:  Abnormal gait, Decreased activity tolerance, Decreased balance, Decreased endurance, Decreased knowledge of use of DME, Decreased mobility, Decreased skin integrity, Decreased strength, Postural dysfunction, Prosthetic Dependency, Pain  Visit Diagnosis: Other abnormalities of gait and mobility  Unsteadiness on feet  Abnormal posture  Weakness generalized  History of fall     Problem List Patient Active Problem List   Diagnosis Date Noted  . Acute metabolic encephalopathy 21/97/5883  . COVID-19 virus infection 02/08/2020  . Acute kidney injury superimposed on CKD ([place]) 02/08/2020  . Status post below-knee amputation of right lower extremity ([place]) 11/07/2019  . Gangrene of right foot ([place])   . Hyponatremia 10/29/2019  . Chronic diastolic CHF (congestive heart failure) ([place]) 10/29/2019  . Stable proliferative diabetic retinopathy of both eyes associated with type 2 diabetes mellitus ([place]) 06/05/2018  . Acute CVA (cerebrovascular accident) ([place]) 02/18/2017  . Acute ischemic stroke ([place])   . Internuclear ophthalmoplegia of left eye   . Benign paroxysmal positional vertigo   .  Abnormality of gait   . Acute onset of severe vertigo  02/17/2017  . Vertigo 02/17/2017  . Atherosclerosis of native artery of extremity with intermittent claudication ([place]) 03/11/2014  . Diabetic retinopathy ([place]) 03/11/2014  . Retinal hemorrhage due to secondary diabetes ([place]) 03/11/2014  . Type II diabetes mellitus with renal manifestations, uncontrolled ([place]) 03/11/2014  . Chronic hepatitis C without hepatic coma ([place]) 03/11/2014  . Hyperlipidemia 03/11/2014  . Hypoglycemia 04/16/2013  . Disorder of bone and cartilage, unspecified   . Other and unspecified hyperlipidemia   . Essential hypertension, benign   . Atherosclerosis of native artery of extremity ([place])   . Chronic kidney disease (CKD), stage II (mild)   . Peripheral arterial disease ([place])   . Anemia   . Postherpetic neuralgia   . Hypertension     Jamey Reas PT, DPT 03/26/2020, 1:27 PM  [facility] [address] [place], [place], [address] Phone: [phone]   Fax:  [phone]  Name: Monique Cabrera MRN: [id number] Date of Birth: 09/09/1935

## 2020-03-26 NOTE — Patient Instructions (Addendum)
Your Plan:  Will repeat EEG - you will schedule appointment when you leave today  If you have recurrent seizure activity or symptoms, will recommend restarting keppra at that time    Follow up in 3 months or call earlier if needed      Thank you for coming to see [place] at [facility]. I hope we have been able to provide you high quality care today.  You may receive a patient satisfaction survey over the next few weeks. We would appreciate your feedback and comments so that we may continue to improve ourselves and the health of our patients.

## 2020-03-26 NOTE — Progress Notes (Signed)
[facility] [address] [place]. [place] [address] [phone]       [place] Monique Cabrera Date of Birth:  [birth year] Medical Record Number:  [id number]   Reason for Referral:  hospital stroke follow up    CHIEF COMPLAINT:  Chief Complaint  Patient presents with  . Hospitalization Follow-up    Rm 9, alone.    . Cerebrovascular Accident    HPI:  [name] is a 85 y.o. female with history of stroke (2018), DM 2, PAD, Rt BKA, CKD 2, HTN and recent cofusion who presented on 02/08/2020 to [facility] ED as a code stroke for right side weakness, and unresponsiveness.    Evaluated by Dr. Erlinda Hong with acute AMS concerning for seizure with Todd's paralysis.  CT head and MRI did not show acute abnormality.  EEG moderate to severe diffuse encephalopathy without seizures or epileptiform.  Recommended continuation of DAPT for secondary stroke prevention.  Was started on Keppra 250 mg twice daily for seizure prevention.  COVID-19 positive during mission.  Prior history of stroke 01/2017.  HTN stable.  LDL 60 with continuation of atorvastatin 20 mg daily.  DM with A1c 11.0 and mild DKA on admission.  Other stroke risk factors include former tobacco use, and PAD w/ R BKA.  Discharged to [facility] in stable condition.  Acute AMS - concerning for seizure with Todd's paralysis   Presented with unresponsive, found down at home with b/b incontinence as well as right sided weakness, now resolved.  CT Head - No acute intracranial abnormality or significant interval change.   MRI head - No acute finding.  CTA H&N - no high grade stenosis or occlusion.  Left M2 stenosis unchanged.  2D Echo - EF 60 - 65%. No cardiac source of emboli identified.   LE dopplers neg DVT   EEG 2/12 - moderate to severe diffuse encephalopathy, nonspecific etiology. No seizures or epileptiform    LDL - 60  HgbA1c - 11.0  VTE prophylaxis - Lovenox 30   Aspirin 81 mg daily  and Plavix 75 mg daily prior to admission, now on aspirin 81 mg daily and clopidogrel 75 mg daily. Continue on discharge  Started on  keppra 250 bid, tolerating well, continue on discharge   Monique Cabrera is being seen today for hospital follow-up unaccompanied.  She has been stable since discharge and has since returned home.  She has not continued on keppra due to confusion of indication for medications with patient reporting being told by other providers medication not indicated. She has not had any recurring symptoms or seizure activity since discharge.  She is currently living alone but is in the process of making plans to move in with daughter due to safety concerns.  No concerns at this time.       ROS:   14 system review of systems performed and negative with exception of no complaints  PMH:  Past Medical History:  Diagnosis Date  . Acute upper respiratory infections of unspecified site   . Amputee 08/2019  . Anemia   . Anemia, unspecified   . Atherosclerosis of native arteries of the extremities, unspecified   . Chest pain, unspecified   . Chronic kidney disease (CKD), stage II (mild)   . Diarrhea   . Disorder of bone and cartilage, unspecified   . Herpes zoster with other nervous system complications(053.19)   . Hypercalcemia   . Hypertension   . Hypertensive renal  disease, benign   . Nonspecific reaction to tuberculin skin test without active tuberculosis(795.51)   . Other and unspecified hyperlipidemia   . PAD (peripheral artery disease) ([place])    Per records from [facility]  . Pain in joint, lower leg   . Peripheral arterial disease ([place])   . Postherpetic neuralgia   . Proteinuria   . Stroke ([place]) 01/2017  . Type II or unspecified type diabetes mellitus with renal manifestations, uncontrolled(250.42)   . Unspecified disorder of kidney and ureter     PSH:  Past Surgical History:  Procedure Laterality Date  . ABDOMINAL AORTOGRAM W/LOWER EXTREMITY N/A 10/31/2019     Procedure: ABDOMINAL AORTOGRAM W/LOWER EXTREMITY;  Surgeon: Wellington Hampshire, MD;  Location: [place] [facility];  Service: Cardiovascular;  Laterality: N/A;  . AMPUTATION Right 11/02/2019   Procedure: AMPUTATION BELOW KNEE RIGHT;  Surgeon: Rosetta Posner, MD;  Location: [place];  Service: Vascular;  Laterality: Right;  . hysterectomy    . INCISION AND DRAINAGE Left 05/27/14   sebacous cyst, ear  . PRP Left    Dr. Ricki Miller  . removal of cyst from hand    . removal of tumor from foot    . TONSILLECTOMY      Social History:  Social History   Socioeconomic History  . Marital status: Widowed    Spouse name: Not on file  . Number of children: 6  . Years of education: 63  . Highest education level: Not on file  Occupational History    Comment: retired, [employer]  Tobacco Use  . Smoking status: Former Smoker    Types: Cigarettes  . Smokeless tobacco: Never Used  . Tobacco comment: Quit about age 89   Substance and Sexual Activity  . Alcohol use: No    Alcohol/week: 0.0 standard drinks  . Drug use: No  . Sexual activity: Never  Other Topics Concern  . Not on file  Social History Narrative   Lives alone, son there occass   Caffeine- coffee 2-3 cups daily, soda off and on   Social Determinants of Health   Financial Resource Strain:   . Difficulty of Paying Living Expenses:   Food Insecurity:   . Worried About Charity fundraiser in the Last Year:   . Arboriculturist in the Last Year:   Transportation Needs:   . Film/video editor (Medical):   Marland Kitchen Lack of Transportation (Non-Medical):   Physical Activity:   . Days of Exercise per Week:   . Minutes of Exercise per Session:   Stress:   . Feeling of Stress :   Social Connections:   . Frequency of Communication with Friends and Family:   . Frequency of Social Gatherings with Friends and Family:   . Attends Religious Services:   . Active Member of Clubs or Organizations:   . Attends Archivist Meetings:   Marland Kitchen Marital  Status:   Intimate Partner Violence:   . Fear of Current or Ex-Partner:   . Emotionally Abused:   Marland Kitchen Physically Abused:   . Sexually Abused:     Family History:  Family History  Problem Relation Age of Onset  . Diabetes Mother   . Diabetes Father   . Diabetes Sister   . Diabetes Sister     Medications:   Current Outpatient Medications on File Prior to Visit  Medication Sig Dispense Refill  . amLODipine (NORVASC) 10 MG tablet TAKE 1 TABLET (10 MG TOTAL) BY MOUTH  DAILY. FOR HIGH BLOOD PRESSURE 90 tablet 1  . aspirin 81 MG chewable tablet Chew 1 tablet (81 mg total) by mouth daily. 90 tablet 3  . ASPIRIN LOW DOSE 81 MG EC tablet Take 81 mg by mouth daily.    Marland Kitchen atorvastatin (LIPITOR) 20 MG tablet TAKE 1 TABLET BY MOUTH EVERY DAY 90 tablet 1  . carvedilol (COREG) 25 MG tablet TAKE 1 TABLET BY MOUTH TWICE A DAY WITH MEALS 180 tablet 1  . clopidogrel (PLAVIX) 75 MG tablet TAKE 1 TABLET BY MOUTH EVERY DAY 90 tablet 1  . feeding supplement, ENSURE ENLIVE, (ENSURE ENLIVE) LIQD Take 237 mLs by mouth 3 (three) times daily between meals. 237 mL 12  . feeding supplement, GLUCERNA SHAKE, (GLUCERNA SHAKE) LIQD Take 237 mLs by mouth 3 (three) times daily between meals.  0  . gabapentin (NEURONTIN) 100 MG capsule Take 100 mg by mouth 3 (three) times daily.    Marland Kitchen glucose 4 GM chewable tablet Chew 1 tablet (4 g total) by mouth as needed for low blood sugar. 50 tablet 12  . Insulin Pen Needle (B-D ULTRAFINE III SHORT PEN) 31G X 8 MM MISC Use to check blood sugar every day. Dx: 11.29; 11.65 100 each 1  . LANTUS SOLOSTAR 100 UNIT/ML Solostar Pen Inject 20 Units into the skin at bedtime.    . mupirocin ointment (BACTROBAN) 2 % Apply to left 2nd toe once daily. 30 g 1  . ONETOUCH VERIO test strip USE TO TEST BLOOD SUGAR THREE TIMES DAILY. DX: E11.9 300 strip 3  . levETIRAcetam (KEPPRA) 250 MG tablet Take 1 tablet (250 mg total) by mouth 2 (two) times daily.     No current facility-administered medications  on file prior to visit.    Allergies:   Allergies  Allergen Reactions  . Invokana [Canagliflozin] Itching, Swelling and Other (See Comments)    Vaginal itching, swelling and irritation  . Jardiance [Empagliflozin] Itching, Swelling and Other (See Comments)    Vaginal itching and swelling  . Tuberculin Ppd Other (See Comments)    Per records from [facility]      Physical Exam  Vitals:   03/26/20 1012  BP: (!) 156/72  Pulse: 66  Temp: (!) 96.8 F (36 C)  Weight: 126 lb 9.6 oz (57.4 kg)  Height: 5\' 5"  (1.651 m)   Body mass index is 21.07 kg/m. No exam data present  General: well developed, well nourished,  pleasant elderly African-American female, seated, in no evident distress Head: head normocephalic and atraumatic.   Neck: supple with no carotid or supraclavicular bruits Cardiovascular: regular rate and rhythm, no murmurs Musculoskeletal: no deformity; R BKA Skin:  no rash/petichiae Vascular:  Normal pulses all extremities   Neurologic Exam Mental Status: Awake and fully alert.   Normal speech and language.  Oriented to place and time. Recent and remote memory intact. Attention span, concentration and fund of knowledge appropriate. Mood and affect appropriate.  Cranial Nerves: Fundoscopic exam reveals sharp disc margins. Pupils equal, briskly reactive to light. Extraocular movements full without nystagmus. Visual fields full to confrontation. Hearing intact. Facial sensation intact. Face, tongue, palate moves normally and symmetrically.  Motor: Normal bulk and tone. Normal strength in all tested extremity muscles. R BKA Sensory.: intact to touch , pinprick , position and vibratory sensation.  Coordination: Rapid alternating movements normal in all extremities. Finger-to-nose performed accurately bilaterally and heel-to-shin unable to perform Gait and Station: Deferred  Reflexes: 1+ and symmetric. Toes downgoing.  ASSESSMENT: Monique Cabrera is a 85 y.o.  year old female presented with right-sided weakness and unresponsiveness on 02/08/2020 with acute AMS concerning for seizure with Todd's paralysis and initiated on Keppra. Vascular risk factors include hx of stroke 2018, DM, HTN, HLD, PAD and former tobacco use.  At some point, Keppra discontinued with patient reporting "I was told I did not need this medication as I did not have a seizure".  She has not had any reoccurring seizure like activity    PLAN:  1. New onset seizure with Todd's paralysis: Advised her that possible recurrence of seizure activity not on AED but as she has been stable, she is not interested in restarting Keppra at this time.  Advised her of potential risks and verbalized understanding.  Advised to call office with any possible seizure activity or symptoms.  We will repeat EEG 2. Hx of stroke : Continue aspirin 81 mg daily and clopidogrel 75 mg daily  and atorvastatin for secondary stroke prevention. Maintain strict control of hypertension with blood pressure goal below 130/90, diabetes with hemoglobin A1c goal below 6.5% and cholesterol with LDL cholesterol (bad cholesterol) goal below 70 mg/dL.  I also advised the patient to eat a healthy diet with plenty of whole grains, cereals, fruits and vegetables, exercise regularly with at least 30 minutes of continuous activity daily and maintain ideal body weight.  Continue to follow with PCP for HTN, HLD and DM management    Follow up in 3 months or call earlier if needed   I spent 26 minutes of face-to-face and non-face-to-face time with patient.  This included previsit chart review, lab review, study review, order entry, electronic health record documentation, patient education regarding recent onset seizure and self discontinuing AED and answered all questions to patient satisfaction   Frann Rider, [facility]  [facility] [address] [place], [place] [address]  Phone [phone] Fax  [phone] Note: This document was prepared with digital dictation and possible smart phrase technology. Any transcriptional errors that result from this process are unintentional.

## 2020-03-28 NOTE — Progress Notes (Signed)
I agree with the above plan 

## 2020-03-31 ENCOUNTER — Ambulatory Visit: Payer: Medicare Other

## 2020-03-31 ENCOUNTER — Ambulatory Visit: Payer: Medicare Other | Attending: Internal Medicine

## 2020-03-31 DIAGNOSIS — Z23 Encounter for immunization: Secondary | ICD-10-CM

## 2020-03-31 NOTE — Progress Notes (Signed)
   Covid-19 Vaccination Clinic  Name:  Monique Cabrera    MRN: [id number] DOB: 27-May-1935  03/31/2020  Monique Cabrera was observed post Covid-19 immunization for 15 minutes without incident. She was provided with Vaccine Information Sheet and instruction to access the V-Safe system.   Monique Cabrera was instructed to call 911 with any severe reactions post vaccine: Marland Kitchen Difficulty breathing  . Swelling of face and throat  . A fast heartbeat  . A bad rash all over body  . Dizziness and weakness   Immunizations Administered    Name Date Dose VIS Date Route   Pfizer COVID-19 Vaccine 03/31/2020  3:07 PM 0.3 mL 12/07/2019 Intramuscular   Manufacturer: [place]   Lot: [id number]   [place]: [address]-8

## 2020-04-01 ENCOUNTER — Ambulatory Visit: Payer: Medicare Other | Admitting: Podiatry

## 2020-04-02 ENCOUNTER — Ambulatory Visit: Payer: Medicare Other | Attending: Vascular Surgery | Admitting: Physical Therapy

## 2020-04-02 ENCOUNTER — Other Ambulatory Visit: Payer: Self-pay

## 2020-04-02 ENCOUNTER — Encounter: Payer: Self-pay | Admitting: Physical Therapy

## 2020-04-02 DIAGNOSIS — R293 Abnormal posture: Secondary | ICD-10-CM | POA: Diagnosis not present

## 2020-04-02 DIAGNOSIS — R531 Weakness: Secondary | ICD-10-CM | POA: Insufficient documentation

## 2020-04-02 DIAGNOSIS — R2689 Other abnormalities of gait and mobility: Secondary | ICD-10-CM | POA: Insufficient documentation

## 2020-04-02 DIAGNOSIS — R2681 Unsteadiness on feet: Secondary | ICD-10-CM | POA: Insufficient documentation

## 2020-04-02 NOTE — Patient Instructions (Signed)
Do each exercise 2  times per day Do each exercise 10 repetitions Hold each exercise for 3 seconds to feel your location  AT [place].  USE TAPE ON FLOOR TO MARK THE MIDLINE POSITION. You also should try to feel with your limb pressure in socket.  You are trying to feel with limb what you used to feel with the bottom of your foot.  1. Side to Side Shift: Moving your hips only (not shoulders): move weight onto your left leg, HOLD/FEEL.  Move back to equal weight on each leg, HOLD/FEEL. Move weight onto your right leg, HOLD/FEEL. Move back to equal weight on each leg, HOLD/FEEL. Repeat. 2. Front to Back Shift: Moving your hips only (not shoulders): move your weight forward onto your toes, HOLD/FEEL. Move your weight back to equal Flat Foot on both legs, HOLD/FEEL. Move your weight back onto your heels, HOLD/FEEL. Move your weight back to equal on both legs, HOLD/FEEL. Repeat. 3. Moving Cones / Cups: With equal weight on each leg: Hold on with one hand the first time, then progress to no hand supports. Move cups from one side of sink to the other. Place cups ~2" out of your reach, progress to 10" beyond reach. 4. Overhead/Upward Reaching: alternated reaching up to top cabinets or ceiling if no cabinets present. Keep equal weight on each leg. Start with one hand support on counter while other hand reaches and progress to no hand support with reaching. 5.   Looking Over Shoulders: With equal weight on each leg: alternate turning to look over your shoulders with one hand support on counter as needed. Shift weight to             side looking, pull hip then shoulder then head/eyes around to look behind you. Start with one hand support & progress to no hand support.

## 2020-04-02 NOTE — Therapy (Signed)
[facility] [address] [place], [place], [address] Phone: [phone]   Fax:  [phone]  Physical Therapy Treatment  Patient Details  Name: Monique Cabrera MRN: [id number] Date of Birth: 03/25/1935 Referring Provider (PT): Curt Jews, MD   Encounter Date: 04/02/2020  PT End of Session - 04/02/20 1535    Visit Number  2    Number of Visits  25    Date for PT Re-Evaluation  06/24/20    Authorization Type  [employer] Medicare & Medicaid    PT Start Time  1532    PT Stop Time  1615    PT Time Calculation (min)  43 min    Equipment Utilized During Treatment  Gait belt    Activity Tolerance  Patient tolerated treatment well;No increased pain    Behavior During Therapy  WFL for tasks assessed/performed       Past Medical History:  Diagnosis Date  . Acute upper respiratory infections of unspecified site   . Amputee 08/2019  . Anemia   . Anemia, unspecified   . Atherosclerosis of native arteries of the extremities, unspecified   . Chest pain, unspecified   . Chronic kidney disease (CKD), stage II (mild)   . Diarrhea   . Disorder of bone and cartilage, unspecified   . Herpes zoster with other nervous system complications(053.19)   . Hypercalcemia   . Hypertension   . Hypertensive renal disease, benign   . Nonspecific reaction to tuberculin skin test without active tuberculosis(795.51)   . Other and unspecified hyperlipidemia   . PAD (peripheral artery disease) ([place])    Per records from [facility]  . Pain in joint, lower leg   . Peripheral arterial disease ([place])   . Postherpetic neuralgia   . Proteinuria   . Stroke ([place]) 01/2017  . Type II or unspecified type diabetes mellitus with renal manifestations, uncontrolled(250.42)   . Unspecified disorder of kidney and ureter     Past Surgical History:  Procedure Laterality Date  . ABDOMINAL AORTOGRAM W/LOWER EXTREMITY N/A 10/31/2019   Procedure: ABDOMINAL AORTOGRAM  W/LOWER EXTREMITY;  Surgeon: Wellington Hampshire, MD;  Location: [facility];  Service: Cardiovascular;  Laterality: N/A;  . AMPUTATION Right 11/02/2019   Procedure: AMPUTATION BELOW KNEE RIGHT;  Surgeon: Rosetta Posner, MD;  Location: [facility];  Service: Vascular;  Laterality: Right;  . hysterectomy    . INCISION AND DRAINAGE Left 05/27/14   sebacous cyst, ear  . PRP Left    Dr. Ricki Miller  . removal of cyst from hand    . removal of tumor from foot    . TONSILLECTOMY      There were no vitals filed for this visit.  Subjective Assessment - 04/02/20 1534    Subjective  No new complatins. No falls or pain.    Limitations  Lifting;Standing;Walking;House hold activities    Patient Stated Goals  To use prosthesis to walk without device, take care of myself, live alone. Go out in community.    Currently in Pain?  No/denies    Pain Score  0-No pain            [facility] Adult PT Treatment/Exercise - 04/02/20 1553      Transfers   Transfers  Sit to Stand;Stand to Sit    Sit to Stand  5: Supervision;With upper extremity assist;With armrests;From chair/3-in-1;Other (comment)    Stand to Sit  5: Supervision;With upper extremity assist;With armrests;To chair/3-in-1;Other (comment)  Neuro Re-ed    Neuro Re-ed Details   pt educated on sink program for balance and proprioception. min guard assist for balance. added to HEP.       Prosthetics   Prosthetic Care Comments   reports limb being wet when she takes of liner, pulling Tegaderm off.  Educated to go ahead and remove that Tegaderm and reapply another one with second wear. Discussed use of antipersperant on bottom of limb to assist with decreased sweating. Educated on use of baby oil on patella/upper thigh for decreased friction from liner and to protect skin. Continued to educate on sock ply management. Pt arrived with 3 ply on stating the prosthesis felt tight. Tried 1 ply with prosthesis being too loose/pt bottoming out in socket. 2 ply  felt better with good socket position.     Current prosthetic wear tolerance (days/week)   daily    Current prosthetic wear tolerance (#hours/day)   2 hours 2x a day, sometimes goes over the 2 hours    Residual limb condition   wound appears to be healing (on tibial tuberosity). recovered with Tegaderm.     Education Provided  Residual limb care;Proper wear schedule/adjustment;Proper weight-bearing schedule/adjustment;Proper Donning;Correct ply sock adjustment    Person(s) Educated  Patient    Education Method  Explanation;Demonstration;Verbal cues;Handout    Education Method  Verbalized understanding;Returned demonstration;Verbal cues required;Needs further instruction    Donning Prosthesis  Supervision    Doffing Prosthesis  Supervision             PT Education - 04/02/20 1608    Education Details  sink program HEP for balance and proprioception    Person(s) Educated  Patient    Methods  Explanation;Demonstration;Verbal cues;Handout    Comprehension  Verbalized understanding;Returned demonstration;Verbal cues required;Need further instruction       PT Short Term Goals - 03/26/20 1321      PT SHORT TERM GOAL #1   Title  Patient demonstrates proper donning & verbalizes proper cleaning of prosthesis. (All STGs Target Date: 04/25/2020)    Time  1    Period  Months    Status  New    Target Date  03/25/20      PT SHORT TERM GOAL #2   Title  Patient tolerates wear of prosthesis >8hrs total / day without wound increasing.    Time  1    Period  Months    Status  New    Target Date  03/25/20      PT SHORT TERM GOAL #3   Title  Standing balance tasks with intermittent UE support on RW reaching 10" anteriorly, picking up items from floor, scanning environment & managing pants for toileting with supervision.    Time  1    Period  Months    Status  New    Target Date  03/25/20      PT SHORT TERM GOAL #4   Title  Patient ambulates 200' with RW & prosthesis with verbal cues for  gait deviations & no physical assist or balance loss.    Time  1    Period  Months    Status  New    Target Date  03/25/20      PT SHORT TERM GOAL #5   Title  Patient negotiates stairs with single rail, ramps & curbs with RW & prosthesis with minA.    Time  1    Period  Months    Status  New  Target Date  03/25/20        PT Long Term Goals - 03/26/20 1313      PT LONG TERM GOAL #1   Title  Patient verbalizes & demonstrates understanding of prosthetic care & diabetic foot care to enable safe utilization. (All LTGs Target Date: 06/24/2020)    Time  3    Period  Months    Status  New    Target Date  06/24/20      PT LONG TERM GOAL #2   Title  Patient tolerates prosthesis wear >90% of awake hours without skin or residual limb pain to enable function throughout her day.    Time  3    Period  Months    Status  New    Target Date  06/24/20      PT LONG TERM GOAL #3   Title  Berg Balance >/= 45/56 to indicate lower fall risk.    Time  3    Period  Months    Status  New    Target Date  06/24/20      PT LONG TERM GOAL #4   Title  Patient ambulates 500' outdoors including grass with cane or less & prosthesis modified independent to enable community mobility.    Time  3    Period  Months    Status  New    Target Date  06/24/20      PT LONG TERM GOAL #5   Title  Patient negotiates ramps, curbs & stairs with single rail with cane or less & prosthesis modified independent to enable community access.    Time  3    Period  Months    Status  New    Target Date  06/24/20      Additional Long Term Goals   Additional Long Term Goals  Yes      PT LONG TERM GOAL #6   Title  Patient demonstrates skills to enable her to return to gardening including kneeling with UE support like garden kneel bench or chair.    Time  3    Period  Months    Status  New    Target Date  06/24/20            Plan - 04/02/20 1627    Clinical Impression Statement  Today's skilled session  continued to address prosthetic management with education on sock ply management, wear time and use of antipersperant/baby oil covered in session today. Pt will need reinforcement of these topics. Remainder of session focused on education on and issuing of the sink program for balance/proprioception. No issues reported or noted with performance in session today. The pt is progressing toward goals and should benefit from continued PT to progress toward unmet goals.    Personal Factors and Comorbidities  Age;Comorbidity 3+;Fitness;Time since onset of injury/illness/exacerbation;Transportation    Comorbidities  Right TTA, HTN, DM, CVA, PAD, HLD, CKD st2,    Examination-Activity Limitations  Locomotion Level;Stairs;Stand;Transfers;Toileting    Examination-Participation Restrictions  Community Activity;Yard Work;Shop    Stability/Clinical Decision Making  Evolving/Moderate complexity    Rehab Potential  Good    PT Frequency  2x / week    PT Duration  12 weeks    PT Treatment/Interventions  ADLs/Self Care Home Management;DME Instruction;Gait training;Stair training;Functional mobility training;Therapeutic activities;Therapeutic exercise;Balance training;Neuromuscular re-education;Patient/family education;Prosthetic Training;Vestibular    PT Next Visit Plan  review prosthetic care,  prosthetic gait with RW    Consulted and Agree with Plan of  Care  Patient       Patient will benefit from skilled therapeutic intervention in order to improve the following deficits and impairments:  Abnormal gait, Decreased activity tolerance, Decreased balance, Decreased endurance, Decreased knowledge of use of DME, Decreased mobility, Decreased skin integrity, Decreased strength, Postural dysfunction, Prosthetic Dependency, Pain  Visit Diagnosis: Unsteadiness on feet  Weakness generalized     Problem List Patient Active Problem List   Diagnosis Date Noted  . Acute metabolic encephalopathy 54/27/0623  . COVID-19  virus infection 02/08/2020  . Acute kidney injury superimposed on CKD ([place]) 02/08/2020  . Status post below-knee amputation of right lower extremity ([place]) 11/07/2019  . Gangrene of right foot ([place])   . Hyponatremia 10/29/2019  . Chronic diastolic CHF (congestive heart failure) ([place]) 10/29/2019  . Stable proliferative diabetic retinopathy of both eyes associated with type 2 diabetes mellitus ([place]) 06/05/2018  . Acute CVA (cerebrovascular accident) ([place]) 02/18/2017  . Acute ischemic stroke ([place])   . Internuclear ophthalmoplegia of left eye   . Benign paroxysmal positional vertigo   . Abnormality of gait   . Acute onset of severe vertigo 02/17/2017  . Vertigo 02/17/2017  . Atherosclerosis of native artery of extremity with intermittent claudication ([place]) 03/11/2014  . Diabetic retinopathy ([place]) 03/11/2014  . Retinal hemorrhage due to secondary diabetes ([place]) 03/11/2014  . Type II diabetes mellitus with renal manifestations, uncontrolled ([place]) 03/11/2014  . Chronic hepatitis C without hepatic coma ([place]) 03/11/2014  . Hyperlipidemia 03/11/2014  . Hypoglycemia 04/16/2013  . Disorder of bone and cartilage, unspecified   . Other and unspecified hyperlipidemia   . Essential hypertension, benign   . Atherosclerosis of native artery of extremity ([place])   . Chronic kidney disease (CKD), stage II (mild)   . Peripheral arterial disease ([place])   . Anemia   . Postherpetic neuralgia   . Hypertension     [facility], [place], [facility] [address] [place], [place] [address] [phone] 04/02/20, 4:30 PM   Name: Monique Cabrera MRN: [id number] Date of Birth: November 16, 1935

## 2020-04-03 ENCOUNTER — Encounter: Payer: Self-pay | Admitting: Internal Medicine

## 2020-04-03 ENCOUNTER — Ambulatory Visit (INDEPENDENT_AMBULATORY_CARE_PROVIDER_SITE_OTHER): Payer: Medicare Other | Admitting: Internal Medicine

## 2020-04-03 VITALS — BP 138/72 | HR 66 | Temp 97.8°F | Ht 65.0 in | Wt 129.5 lb

## 2020-04-03 DIAGNOSIS — E11649 Type 2 diabetes mellitus with hypoglycemia without coma: Secondary | ICD-10-CM

## 2020-04-03 DIAGNOSIS — I639 Cerebral infarction, unspecified: Secondary | ICD-10-CM | POA: Diagnosis not present

## 2020-04-03 DIAGNOSIS — I10 Essential (primary) hypertension: Secondary | ICD-10-CM

## 2020-04-03 DIAGNOSIS — R569 Unspecified convulsions: Secondary | ICD-10-CM | POA: Diagnosis not present

## 2020-04-03 DIAGNOSIS — I96 Gangrene, not elsewhere classified: Secondary | ICD-10-CM | POA: Diagnosis not present

## 2020-04-03 DIAGNOSIS — IMO0002 Reserved for concepts with insufficient information to code with codable children: Secondary | ICD-10-CM

## 2020-04-03 DIAGNOSIS — R413 Other amnesia: Secondary | ICD-10-CM

## 2020-04-03 DIAGNOSIS — Z89511 Acquired absence of right leg below knee: Secondary | ICD-10-CM | POA: Diagnosis not present

## 2020-04-03 DIAGNOSIS — E1165 Type 2 diabetes mellitus with hyperglycemia: Secondary | ICD-10-CM

## 2020-04-03 DIAGNOSIS — E1129 Type 2 diabetes mellitus with other diabetic kidney complication: Secondary | ICD-10-CM

## 2020-04-03 MED ORDER — INSULIN LISPRO 100 UNIT/ML ~~LOC~~ SOLN: 3.0000 [IU] | Freq: Three times a day (TID) | SUBCUTANEOUS | 11 refills | Status: AC

## 2020-04-03 NOTE — Progress Notes (Signed)
Location:  [facility] clinic Provider:  Bron Snellings L. Mariea Clonts, D.O., C.M.D.  Code Status: DNR Goals of Care:  Advanced Directives 04/03/2020  Does Patient Have a Medical Advance Directive? Yes  Type of Advance Directive Out of facility DNR (pink MOST or yellow form)  Does patient want to make changes to medical advance directive? No - Patient declined  Would patient like information on creating a medical advance directive? -  Pre-existing out of facility DNR order (yellow form or pink MOST form) -    Chief Complaint  Patient presents with  . Medical Management of Chronic Issues    4 week follow up     HPI: Patient is a 85 y.o. female seen today for medical management of chronic diseases.    She is up to wearing her prosthesis three hours at a time with PT.  She is learning to get her right side to move like the left.  Has exercises to do at home also.    Blood sugar is staying in 100s now.  Hardly ever hits 200 now.  She is taking her medication as directed.  She gets up and fixes breakfast--schedule is a little late--10am.  Reminded she cannot go too long without eating.  She is eating a whole lot more than she used to at each meal.  She eats a lot of peanuts, pistachios and cashews.  She's taking lantus 20 units in am right after eating.  She is also taking humalog 3 units after meals (to avoid hypoglycemia).  Since she left the hospital the last time, she does not feel the same as before she went.  She forgets things minute to minute at times.  She does not have the confidence she had and is less certain about things.   Has trouble finding words at times.    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  She had a negative EEG. I explained to her about foci for seizures but she does not want to have another EEG.  She never took the seizure medication b/c she's convinced herself she didn't have a seizure.  The NP at neurology had told her to take it and she still is not.   Says home health nurse told her it didn't make sense for her to take it.    She also says she was already taking asa 81mg  but not chewable and did not change.    She has started to stay at her daughter's home on the weekend.  This last weekend, she really enjoyed being there.  They went to the kids' egg hunt at daughter's friend's home.  She had a nice talk with another older adult with diabetes that was there.  She had not been out and about at all since her amputation.  She looks forward to being able to go do things and help in the kitchen.    She's had her two covid vaccines.  Past Medical History:  Diagnosis Date  . Acute upper respiratory infections of unspecified site   . Amputee 08/2019  . Anemia   . Anemia, unspecified   . Atherosclerosis of native arteries of the extremities, unspecified   . Chest pain, unspecified   . Chronic kidney disease (CKD), stage II (mild)   . Diarrhea   . Disorder of bone and cartilage, unspecified   . Herpes zoster with other nervous system complications(053.19)   . Hypercalcemia   . Hypertension   . Hypertensive renal  disease, benign   . Nonspecific reaction to tuberculin skin test without active tuberculosis(795.51)   . Other and unspecified hyperlipidemia   . PAD (peripheral artery disease) ([place])    Per records from [facility]  . Pain in joint, lower leg   . Peripheral arterial disease ([place])   . Postherpetic neuralgia   . Proteinuria   . Stroke ([place]) 01/2017  . Type II or unspecified type diabetes mellitus with renal manifestations, uncontrolled(250.42)   . Unspecified disorder of kidney and ureter     Past Surgical History:  Procedure Laterality Date  . ABDOMINAL AORTOGRAM W/LOWER EXTREMITY N/A 10/31/2019   Procedure: ABDOMINAL AORTOGRAM W/LOWER EXTREMITY;  Surgeon: Wellington Hampshire, MD;  Location: [facility];  Service: Cardiovascular;  Laterality: N/A;  . AMPUTATION Right 11/02/2019   Procedure: AMPUTATION BELOW KNEE RIGHT;   Surgeon: Rosetta Posner, MD;  Location: [place];  Service: Vascular;  Laterality: Right;  . hysterectomy    . INCISION AND DRAINAGE Left 05/27/14   sebacous cyst, ear  . PRP Left    Dr. Ricki Miller  . removal of cyst from hand    . removal of tumor from foot    . TONSILLECTOMY      Allergies  Allergen Reactions  . Invokana [Canagliflozin] Itching, Swelling and Other (See Comments)    Vaginal itching, swelling and irritation  . Jardiance [Empagliflozin] Itching, Swelling and Other (See Comments)    Vaginal itching and swelling  . Tuberculin Ppd Other (See Comments)    Per records from [name] Encounter Medications as of 04/03/2020  Medication Sig  . amLODipine (NORVASC) 10 MG tablet TAKE 1 TABLET (10 MG TOTAL) BY MOUTH DAILY. FOR HIGH BLOOD PRESSURE  . aspirin 81 MG chewable tablet Chew 1 tablet (81 mg total) by mouth daily.  . ASPIRIN LOW DOSE 81 MG EC tablet Take 81 mg by mouth daily.  Marland Kitchen atorvastatin (LIPITOR) 20 MG tablet TAKE 1 TABLET BY MOUTH EVERY DAY  . carvedilol (COREG) 25 MG tablet TAKE 1 TABLET BY MOUTH TWICE A DAY WITH MEALS  . clopidogrel (PLAVIX) 75 MG tablet TAKE 1 TABLET BY MOUTH EVERY DAY  . feeding supplement, ENSURE ENLIVE, (ENSURE ENLIVE) LIQD Take 237 mLs by mouth 3 (three) times daily between meals.  . feeding supplement, GLUCERNA SHAKE, (GLUCERNA SHAKE) LIQD Take 237 mLs by mouth 3 (three) times daily between meals.  . gabapentin (NEURONTIN) 100 MG capsule Take 100 mg by mouth 3 (three) times daily.  Marland Kitchen glucose 4 GM chewable tablet Chew 1 tablet (4 g total) by mouth as needed for low blood sugar.  . Insulin Pen Needle (B-D ULTRAFINE III SHORT PEN) 31G X 8 MM MISC Use to check blood sugar every day. Dx: 11.29; 11.65  . LANTUS SOLOSTAR 100 UNIT/ML Solostar Pen Inject 20 Units into the skin at bedtime.  . levETIRAcetam (KEPPRA) 250 MG tablet Take 1 tablet (250 mg total) by mouth 2 (two) times daily.  . mupirocin ointment (BACTROBAN) 2 % Apply to left  2nd toe once daily.  Glory Rosebush VERIO test strip USE TO TEST BLOOD SUGAR THREE TIMES DAILY. DX: E11.9   No facility-administered encounter medications on file as of 04/03/2020.    Review of Systems:  Review of Systems  Constitutional: Negative for chills and fever.  HENT: Negative for congestion and sore throat.   Eyes: Negative for blurred vision.  Respiratory: Negative for cough and shortness of breath.   Cardiovascular: Negative  for chest pain, palpitations and leg swelling.  Gastrointestinal: Negative for abdominal pain, blood in stool, constipation, diarrhea and melena.  Genitourinary: Negative for dysuria.  Musculoskeletal: Negative for falls and joint pain.       Right BKA  Skin: Negative for itching and rash.  Neurological: Negative for dizziness and loss of consciousness.  Psychiatric/Behavioral: Positive for memory loss. Negative for depression. The patient is not nervous/anxious and does not have insomnia.     Health Maintenance  Topic Date Due  . OPHTHALMOLOGY EXAM  07/25/2020  . INFLUENZA VACCINE  07/27/2020  . HEMOGLOBIN A1C  08/08/2020  . FOOT EXAM  11/14/2020  . DEXA SCAN  Completed  . PNA vac Low Risk Adult  Completed    Physical Exam: Vitals:   04/03/20 0854  BP: 138/72  Pulse: 66  Temp: 97.8 F (36.6 C)  SpO2: 98%  Weight: 129 lb 8 oz (58.7 kg)  Height: 5\' 5"  (1.651 m)   Body mass index is 21.55 kg/m. Physical Exam Vitals reviewed.  Constitutional:      General: She is not in acute distress.    Appearance: Normal appearance. She is not ill-appearing or toxic-appearing.  HENT:     Head: Normocephalic and atraumatic.  Cardiovascular:     Rate and Rhythm: Normal rate and regular rhythm.     Pulses: Normal pulses.     Heart sounds: Normal heart sounds.  Pulmonary:     Effort: Pulmonary effort is normal.     Breath sounds: Normal breath sounds. No wheezing, rhonchi or rales.  Abdominal:     General: Bowel sounds are normal.  Musculoskeletal:         General: Normal range of motion.     Right lower leg: No edema.     Left lower leg: No edema.  Skin:    General: Skin is warm and dry.  Neurological:     General: No focal deficit present.     Mental Status: She is alert and oriented to person, place, and time.     Cranial Nerves: No cranial nerve deficit.     Gait: Gait abnormal.     Comments: Now learning to walk with prosthesis on  Psychiatric:        Mood and Affect: Mood normal.        Behavior: Behavior normal.     Labs reviewed: Basic Metabolic Panel: Recent Labs    10/29/19 2336 10/30/19 0552 11/01/19 0403 11/02/19 0521 02/11/20 1025 02/11/20 0657 02/12/20 0332 02/12/20 0332 02/13/20 0504 02/14/20 0714 03/06/20 1603  NA  --    < > 132*   < > 140   < > 137   < > 138 134* 138  K  --    < > 3.9   < > 3.7   < > 3.3*   < > 4.1 3.9 3.6  CL  --    < > 103   < > 112*   < > 107   < > 110 104 106  CO2  --    < > 19*   < > 20*   < > 21*   < > 21* 21* 25  GLUCOSE  --    < > 219*   < > 103*   < > 102*   < > 158* 209* 58*  BUN  --    < > 24*   < > 19   < > 19   < > 17 25* 25  CREATININE  --    < > 1.37*   < > 1.40*   < > 1.60*   < > 1.59* 1.70* 1.49*  CALCIUM  --    < > 9.9   < > 9.6   < > 9.5   < > 9.2 9.1 10.2  MG 1.6*   < > 1.7  --  1.2*   < > 1.2*  --  1.4* 1.3*  --   PHOS 2.1*  --  1.8*  --  2.1*  --  2.3*  --   --   --   --   TSH 0.989  --   --   --   --   --   --   --   --   --   --    < > = values in this interval not displayed.   Liver Function Tests: Recent Labs    02/11/20 0657 02/12/20 0332 02/13/20 0504  AST 21 20 20   ALT 14 15 14   ALKPHOS 54 54 54  BILITOT 1.0 0.9 0.7  PROT 5.9* 5.5* 5.0*  ALBUMIN 2.5* 2.6* 2.2*   No results for input(s): LIPASE, AMYLASE in the last 8760 hours. No results for input(s): AMMONIA in the last 8760 hours. CBC: Recent Labs    02/12/20 0332 02/13/20 0504 03/06/20 1603  WBC 6.2 6.2 8.0  NEUTROABS 3.4 3.3 4,208  HGB 11.1* 10.1* 9.4*  HCT 32.0* 30.0* 28.5*  MCV  89.9 90.9 97.3  PLT 233 220 282   Lipid Panel: Recent Labs    02/09/20 1242  CHOL 242*  HDL 69  LDLCALC 153*  TRIG 98  CHOLHDL 3.5   Lab Results  Component Value Date   HGBA1C 11.0 (H) 02/09/2020    Procedures since last visit: DG Foot Complete Left  Result Date: 03/05/2020 Please see detailed radiograph report in office note.  VAS [place] ABI WITH/WO TBI  Result Date: 03/18/2020 LOWER EXTREMITY DOPPLER STUDY  Vascular Interventions: Right BKA 11/02/2019. Comparison Study: ABI at outside office 09/2019 Performing Technologist: Kathrine Comfort RVT, RDCS  Examination Guidelines: A complete evaluation includes at minimum, Doppler waveform signals and systolic blood pressure reading at the level of bilateral brachial, anterior tibial, and posterior tibial arteries, when vessel segments are accessible. Bilateral testing is considered an integral part of a complete examination. Photoelectric Plethysmograph (PPG) waveforms and toe systolic pressure readings are included as required and additional duplex testing as needed. Limited examinations for reoccurring indications may be performed as noted.  ABI Findings: +--------+------------------+-----+--------+--------+ Right   Rt Pressure (mmHg)IndexWaveformComment  +--------+------------------+-----+--------+--------+ [id number]                                     +--------+------------------+-----+--------+--------+ +---------+------------------+-----+----------+----------+ Left     Lt Pressure (mmHg)IndexWaveform  Comment    +---------+------------------+-----+----------+----------+ Brachial 184                                         +---------+------------------+-----+----------+----------+ PTA                             monophasicinaudible  +---------+------------------+-----+----------+----------+ DP       241               1.30 monophasic           +---------+------------------+-----+----------+----------+  Great  Toe145               0.78           Unreliable +---------+------------------+-----+----------+----------+ +-------+-----------+-----------+------------+------------+ ABI/TBIToday's ABIToday's TBIPrevious ABIPrevious TBI +-------+-----------+-----------+------------+------------+ Left   1.3                   [place]                       +-------+-----------+-----------+------------+------------+  Summary: Left: Resting left ankle-brachial index indicates noncompressible left lower extremity arteries. TBIs are unreliable. PPG tracings appear dampened.  *See table(s) above for measurements and observations.  Electronically signed by Curt Jews MD on 03/18/2020 at 11:12:49 AM.   Final     Assessment/Plan 1. Type II diabetes mellitus with renal manifestations, uncontrolled (HCC) - turns out she's been taking humalog with meals and we didn't know she restarted it - cont lantus as is - insulin lispro (HUMALOG) 100 UNIT/ML injection; Inject 0.03 mLs (3 Units total) into the skin 3 (three) times daily after meals.  Dispense: 10 mL; Refill: 11 -encouraged consistent meal times   2. Hypoglycemia due to type 2 diabetes mellitus ([place]) -not having recently since eating bigger what sound like healthier meals -cont to monitor and has glucose available for emergencies if she forgets to eat consistently or takes insulin without eating -is to move in with her daughter soon--letter done to indicate need for handicapped living environment and inability to live alone so she can break her lease at her current apt  3. Seizure ([place]) -she never did take the keppra that was ordered and now does not want the follow-up EEG that neurology recommended even when I explained the rationale to her  4. Essential hypertension, benign -bp at goal with current therapy and w/o dizziness, cont same  5. Status post below-knee amputation of right lower extremity ([place]) - doing very well making progress with prosthesis  6.  Short-term memory loss -has had some since her stroke -not helped with hypo and hyperglycemic spells -continue to monitor mmse annually with AWV and watch for concerns -glad she's moving in with her daughter soon so she will have more help with meals and med monitoring  Labs/tests ordered:  Lab Orders     CBC with Differential/Platelet     COMPLETE METABOLIC PANEL WITH GFR     Hemoglobin A1c     Lipid panel  Next appt:  Visit date not found--was to return in 6 wks with fasting labs same day but did not schedule when she left  Annarose Ouellet L. Danel Studzinski, D.O. [facility] [address], [place] [address] Cell Phone (Mon-Fri 8am-5pm):  [phone] On Call:  [phone] & follow prompts after 5pm & weekends Office Phone:  [phone] Office Fax:  [phone]

## 2020-04-03 NOTE — Patient Instructions (Signed)
Next time, please bring all meds and insulin and glucometer.  I will check them all.

## 2020-04-04 ENCOUNTER — Encounter: Payer: Self-pay | Admitting: Physical Therapy

## 2020-04-04 ENCOUNTER — Ambulatory Visit: Payer: Medicare Other | Admitting: Physical Therapy

## 2020-04-04 ENCOUNTER — Other Ambulatory Visit: Payer: Self-pay

## 2020-04-04 DIAGNOSIS — R293 Abnormal posture: Secondary | ICD-10-CM | POA: Diagnosis not present

## 2020-04-04 DIAGNOSIS — R531 Weakness: Secondary | ICD-10-CM

## 2020-04-04 DIAGNOSIS — R2689 Other abnormalities of gait and mobility: Secondary | ICD-10-CM | POA: Diagnosis not present

## 2020-04-04 DIAGNOSIS — R2681 Unsteadiness on feet: Secondary | ICD-10-CM

## 2020-04-04 NOTE — Therapy (Signed)
[facility] [address] [place], [place], [address] Phone: [phone]   Fax:  [phone]  Physical Therapy Treatment  Patient Details  Name: Monique Cabrera MRN: [id number] Date of Birth: [birth year] Referring Provider (PT): Curt Jews, MD   Encounter Date: 04/04/2020  PT End of Session - 04/04/20 1321    Visit Number  3    Number of Visits  25    Date for PT Re-Evaluation  06/24/20    Authorization Type  [employer]HC Medicare & Medicaid    PT Start Time  1317    PT Stop Time  1359    PT Time Calculation (min)  42 min    Equipment Utilized During Treatment  Gait belt    Activity Tolerance  Patient tolerated treatment well;No increased pain    Behavior During Therapy  WFL for tasks assessed/performed       Past Medical History:  Diagnosis Date  . Acute upper respiratory infections of unspecified site   . Amputee 08/2019  . Anemia   . Anemia, unspecified   . Atherosclerosis of native arteries of the extremities, unspecified   . Chest pain, unspecified   . Chronic kidney disease (CKD), stage II (mild)   . Diarrhea   . Disorder of bone and cartilage, unspecified   . Herpes zoster with other nervous system complications(053.19)   . Hypercalcemia   . Hypertension   . Hypertensive renal disease, benign   . Nonspecific reaction to tuberculin skin test without active tuberculosis(795.51)   . Other and unspecified hyperlipidemia   . PAD (peripheral artery disease) ([place])    Per records from [name]  . Pain in joint, lower leg   . Peripheral arterial disease ([place])   . Postherpetic neuralgia   . Proteinuria   . Stroke ([place]) 01/2017  . Type II or unspecified type diabetes mellitus with renal manifestations, uncontrolled(250.42)   . Unspecified disorder of kidney and ureter     Past Surgical History:  Procedure Laterality Date  . ABDOMINAL AORTOGRAM W/LOWER EXTREMITY N/A 10/31/2019   Procedure: ABDOMINAL AORTOGRAM  W/LOWER EXTREMITY;  Surgeon: Wellington Hampshire, MD;  Location: [facility];  Service: Cardiovascular;  Laterality: N/A;  . AMPUTATION Right 11/02/2019   Procedure: AMPUTATION BELOW KNEE RIGHT;  Surgeon: Rosetta Posner, MD;  Location: [facility];  Service: Vascular;  Laterality: Right;  . hysterectomy    . INCISION AND DRAINAGE Left 05/27/14   sebacous cyst, ear  . PRP Left    Dr. Ricki Miller  . removal of cyst from hand    . removal of tumor from foot    . TONSILLECTOMY      There were no vitals filed for this visit.  Subjective Assessment - 04/04/20 1320    Subjective  No new complaints. No falls or pain. Doing the sink HEP without any issues.    Limitations  Lifting;Standing;Walking;House hold activities    Patient Stated Goals  To use prosthesis to walk without device, take care of myself, live alone. Go out in community.    Pain Score  0-No pain           [facility] Adult PT Treatment/Exercise - 04/04/20 1321      Transfers   Transfers  Sit to Stand;Stand to Sit    Sit to Stand  5: Supervision;With upper extremity assist;With armrests;From chair/3-in-1;Other (comment)    Stand to Sit  5: Supervision;With upper extremity assist;With armrests;To chair/3-in-1;Other (comment)  Ambulation/Gait   Ambulation/Gait  Yes    Ambulation/Gait Assistance  4: Min assist;4: Min guard    Ambulation/Gait Assistance Details  cues for posture, step position/to widen base of support, for heel strike with prosthesis and for weight shifting over prosthesis in stance position.     Ambulation Distance (Feet)  70 Feet   x 2, 120    Assistive device  Rolling walker;Prosthesis    Gait Pattern  Step-to pattern;Step-through pattern;Decreased stride length;Decreased stance time - right;Decreased step length - left;Narrow base of support;Trunk flexed    Ambulation Surface  Level;Indoor    Gait Comments  discussed gait at home. Pt appears to be safe for short distances with someone next to her. She reports  that someone checks on her daily and can walk with her. Pt mentioned walking on sidewalk outside her door. Discussed that uneven surface are harder than indoor even surfaces and would increase her fall risk at this time. Pt verbalized understanding.       Prosthetics   Prosthetic Care Comments   education proivided on changing shoes as pt reports changing her shoes today. Discussed that the heel to toe depth needing to be the same. Gave visual demo with pt's prosthesis what can happen at her knee with a higher sole and lower sole. Pt verbalized understanding.     Current prosthetic wear tolerance (days/week)   daily    Current prosthetic wear tolerance (#hours/day)   3 hours 2x a day    Residual limb condition   wound with small yellow scab in center, open pick area around this. appears to be healing. left Tegaderm over it.     Education Provided  Residual limb care;Proper weight-bearing schedule/adjustment    Person(s) Educated  Patient    Education Method  Explanation;Demonstration;Verbal cues    Education Method  Verbalized understanding;Returned demonstration;Verbal cues required;Needs further instruction    Donning Prosthesis  Supervision    Doffing Prosthesis  Supervision           PT Short Term Goals - 03/26/20 1321      PT SHORT TERM GOAL #1   Title  Patient demonstrates proper donning & verbalizes proper cleaning of prosthesis. (All STGs Target Date: 04/25/2020)    Time  1    Period  Months    Status  New    Target Date  03/25/20      PT SHORT TERM GOAL #2   Title  Patient tolerates wear of prosthesis >8hrs total / day without wound increasing.    Time  1    Period  Months    Status  New    Target Date  03/25/20      PT SHORT TERM GOAL #3   Title  Standing balance tasks with intermittent UE support on RW reaching 10" anteriorly, picking up items from floor, scanning environment & managing pants for toileting with supervision.    Time  1    Period  Months    Status  New     Target Date  03/25/20      PT SHORT TERM GOAL #4   Title  Patient ambulates 200' with RW & prosthesis with verbal cues for gait deviations & no physical assist or balance loss.    Time  1    Period  Months    Status  New    Target Date  03/25/20      PT SHORT TERM GOAL #5   Title  Patient negotiates stairs  with single rail, ramps & curbs with RW & prosthesis with minA.    Time  1    Period  Months    Status  New    Target Date  03/25/20        PT Long Term Goals - 03/26/20 1313      PT LONG TERM GOAL #1   Title  Patient verbalizes & demonstrates understanding of prosthetic care & diabetic foot care to enable safe utilization. (All LTGs Target Date: 06/24/2020)    Time  3    Period  Months    Status  New    Target Date  06/24/20      PT LONG TERM GOAL #2   Title  Patient tolerates prosthesis wear >90% of awake hours without skin or residual limb pain to enable function throughout her day.    Time  3    Period  Months    Status  New    Target Date  06/24/20      PT LONG TERM GOAL #3   Title  Berg Balance >/= 45/56 to indicate lower fall risk.    Time  3    Period  Months    Status  New    Target Date  06/24/20      PT LONG TERM GOAL #4   Title  Patient ambulates 500' outdoors including grass with cane or less & prosthesis modified independent to enable community mobility.    Time  3    Period  Months    Status  New    Target Date  06/24/20      PT LONG TERM GOAL #5   Title  Patient negotiates ramps, curbs & stairs with single rail with cane or less & prosthesis modified independent to enable community access.    Time  3    Period  Months    Status  New    Target Date  06/24/20      Additional Long Term Goals   Additional Long Term Goals  Yes      PT LONG TERM GOAL #6   Title  Patient demonstrates skills to enable her to return to gardening including kneeling with UE support like garden kneel bench or chair.    Time  3    Period  Months    Status  New     Target Date  06/24/20            Plan - 04/04/20 1321    Clinical Impression Statement  Today's skilled session continued to focus on prosthetic education and gait with RW/prosthesis. Pt appears safe to ambulate short distances in her home with someone standing next to her. The pt is progressing toward goals and should benefit from contined PT to progress toward unmet goals.    Personal Factors and Comorbidities  Age;Comorbidity 3+;Fitness;Time since onset of injury/illness/exacerbation;Transportation    Comorbidities  Right TTA, HTN, DM, CVA, PAD, HLD, CKD st2,    Examination-Activity Limitations  Locomotion Level;Stairs;Stand;Transfers;Toileting    Examination-Participation Restrictions  Community Activity;Yard Work;Shop    Stability/Clinical Decision Making  Evolving/Moderate complexity    Rehab Potential  Good    PT Frequency  2x / week    PT Duration  12 weeks    PT Treatment/Interventions  ADLs/Self Care Home Management;DME Instruction;Gait training;Stair training;Functional mobility training;Therapeutic activities;Therapeutic exercise;Balance training;Neuromuscular re-education;Patient/family education;Prosthetic Training;Vestibular    PT Next Visit Plan  review prosthetic care,  prosthetic gait with RW, begin to work on  barriers with RW/prosthesis    Consulted and Agree with Plan of Care  Patient       Patient will benefit from skilled therapeutic intervention in order to improve the following deficits and impairments:  Abnormal gait, Decreased activity tolerance, Decreased balance, Decreased endurance, Decreased knowledge of use of DME, Decreased mobility, Decreased skin integrity, Decreased strength, Postural dysfunction, Prosthetic Dependency, Pain  Visit Diagnosis: Unsteadiness on feet  Weakness generalized  Other abnormalities of gait and mobility  Abnormal posture     Problem List Patient Active Problem List   Diagnosis Date Noted  . Acute metabolic  encephalopathy 12/15/7587  . COVID-19 virus infection 02/08/2020  . Acute kidney injury superimposed on CKD ([place]) 02/08/2020  . Status post below-knee amputation of right lower extremity ([place]) 11/07/2019  . Gangrene of right foot ([place])   . Hyponatremia 10/29/2019  . Chronic diastolic CHF (congestive heart failure) ([place]) 10/29/2019  . Stable proliferative diabetic retinopathy of both eyes associated with type 2 diabetes mellitus ([place]) 06/05/2018  . Acute CVA (cerebrovascular accident) ([place]) 02/18/2017  . Acute ischemic stroke ([place])   . Internuclear ophthalmoplegia of left eye   . Benign paroxysmal positional vertigo   . Abnormality of gait   . Acute onset of severe vertigo 02/17/2017  . Vertigo 02/17/2017  . Atherosclerosis of native artery of extremity with intermittent claudication ([place]) 03/11/2014  . Diabetic retinopathy ([place]) 03/11/2014  . Retinal hemorrhage due to secondary diabetes ([place]) 03/11/2014  . Type II diabetes mellitus with renal manifestations, uncontrolled ([place]) 03/11/2014  . Chronic hepatitis C without hepatic coma ([place]) 03/11/2014  . Hyperlipidemia 03/11/2014  . Hypoglycemia 04/16/2013  . Disorder of bone and cartilage, unspecified   . Other and unspecified hyperlipidemia   . Essential hypertension, benign   . Atherosclerosis of native artery of extremity ([place])   . Chronic kidney disease (CKD), stage II (mild)   . Peripheral arterial disease ([place])   . Anemia   . Postherpetic neuralgia   . Hypertension     [facility] [place], [place], [place] [address], [place], [place] [address] [phone] 04/04/20, 6:07 PM   Name: Monique Cabrera MRN: [id number] Date of Birth: [birth year]

## 2020-04-08 DIAGNOSIS — Z66 Do not resuscitate: Secondary | ICD-10-CM

## 2020-04-08 HISTORY — DX: Do not resuscitate: Z66

## 2020-04-10 ENCOUNTER — Other Ambulatory Visit: Payer: Medicare Other

## 2020-04-11 ENCOUNTER — Other Ambulatory Visit: Payer: Self-pay

## 2020-04-11 ENCOUNTER — Ambulatory Visit: Payer: Medicare Other | Admitting: Physical Therapy

## 2020-04-11 ENCOUNTER — Encounter: Payer: Self-pay | Admitting: Physical Therapy

## 2020-04-11 DIAGNOSIS — R531 Weakness: Secondary | ICD-10-CM

## 2020-04-11 DIAGNOSIS — R2689 Other abnormalities of gait and mobility: Secondary | ICD-10-CM | POA: Diagnosis not present

## 2020-04-11 DIAGNOSIS — R2681 Unsteadiness on feet: Secondary | ICD-10-CM

## 2020-04-11 DIAGNOSIS — R293 Abnormal posture: Secondary | ICD-10-CM | POA: Diagnosis not present

## 2020-04-11 NOTE — Therapy (Signed)
[facility] [address] [place], [place], [address] Phone: [phone]   Fax:  [phone]  Physical Therapy Treatment  Patient Details  Name: Monique Cabrera MRN: [id number] Date of Birth: 03-Apr-1935 Referring Provider (PT): Curt Jews, MD   Encounter Date: 04/11/2020  PT End of Session - 04/11/20 1455    Visit Number  4    Number of Visits  25    Date for PT Re-Evaluation  06/24/20    Authorization Type  UHC Medicare & Medicaid    PT Start Time  1448    PT Stop Time  1529    PT Time Calculation (min)  41 min    Equipment Utilized During Treatment  Gait belt    Activity Tolerance  Patient tolerated treatment well;No increased pain    Behavior During Therapy  WFL for tasks assessed/performed       Past Medical History:  Diagnosis Date  . Acute upper respiratory infections of unspecified site   . Amputee 08/2019  . Anemia   . Anemia, unspecified   . Atherosclerosis of native arteries of the extremities, unspecified   . Chest pain, unspecified   . Chronic kidney disease (CKD), stage II (mild)   . Diarrhea   . Disorder of bone and cartilage, unspecified   . Herpes zoster with other nervous system complications(053.19)   . Hypercalcemia   . Hypertension   . Hypertensive renal disease, benign   . Nonspecific reaction to tuberculin skin test without active tuberculosis(795.51)   . Other and unspecified hyperlipidemia   . PAD (peripheral artery disease) ([place])    Per [facility] from [name]  . Pain in joint, lower leg   . Peripheral arterial disease ([place])   . Postherpetic neuralgia   . Proteinuria   . Stroke ([place]) 01/2017  . Type II or unspecified type diabetes mellitus with renal manifestations, uncontrolled(250.42)   . Unspecified disorder of kidney and ureter     Past Surgical History:  Procedure Laterality Date  . ABDOMINAL AORTOGRAM W/LOWER EXTREMITY N/A 10/31/2019   Procedure: ABDOMINAL AORTOGRAM  W/LOWER EXTREMITY;  Surgeon: Wellington Hampshire, MD;  Location: [facility];  Service: Cardiovascular;  Laterality: N/A;  . AMPUTATION Right 11/02/2019   Procedure: AMPUTATION BELOW KNEE RIGHT;  Surgeon: Rosetta Posner, MD;  Location: [facility];  Service: Vascular;  Laterality: Right;  . hysterectomy    . INCISION AND DRAINAGE Left 05/27/14   sebacous cyst, ear  . PRP Left    Dr. Ricki Miller  . removal of cyst from hand    . removal of tumor from foot    . TONSILLECTOMY      There were no vitals filed for this visit.  Subjective Assessment - 04/11/20 1454    Subjective  No new complaints. No falls or pain to report. Has her leg rest on wheelchair today that needs to be adjusted.    Limitations  Lifting;Standing;Walking;House hold activities    Patient Stated Goals  To use prosthesis to walk without device, take care of myself, live alone. Go out in community.    Currently in Pain?  No/denies    Pain Score  0-No pain          [facility] Adult PT Treatment/Exercise - 04/11/20 1456      Transfers   Transfers  Sit to Stand;Stand to Sit    Sit to Stand  5: Supervision;With upper extremity assist;With armrests;From chair/3-in-1;Other (comment)    Sit to  Stand Details  Verbal cues for sequencing;Verbal cues for technique;Verbal cues for safe use of DME/AE    Sit to Stand Details (indicate cue type and reason)  cues to scoot to edge of chair,     Stand to Sit  5: Supervision;With upper extremity assist;With armrests;To chair/3-in-1;Other (comment)    Stand to Sit Details (indicate cue type and reason)  Verbal cues for sequencing;Verbal cues for technique;Verbal cues for safe use of DME/AE      Ambulation/Gait   Ambulation/Gait  Yes    Ambulation/Gait Assistance  4: Min guard;4: Min assist    Ambulation/Gait Assistance Details  pt with reports of prosthesis pushing back at her knee. Pt has again changed her shoes on the prosthesis. Now with an angle down toward heel which will cause the top of  socket to push into knee. Once again went over how the shoe sole height will affect how the prosthesis affects her knee. Pt is to either change the shoes to the one's on the prosthesis when she got it or bring them in so they can be used with session. The purpose is for her to see how the prosthesis should affect her knee and to allow therapist to see if the prosthesis itself needs adjustements.     Ambulation Distance (Feet)  130 Feet    Assistive device  Rolling walker;Prosthesis    Gait Pattern  Step-to pattern;Step-through pattern;Decreased stride length;Decreased stance time - right;Decreased step length - left;Narrow base of support;Trunk flexed    Ambulation Surface  Level;Indoor      Prosthetics   Prosthetic Care Comments   pt to increase to 4 hours 2x a day. worked on [employer] for [employer].     Current prosthetic wear tolerance (days/week)   daily    Current prosthetic wear tolerance (#hours/day)   3 hours 2x a day, sometimes longer each time if she gets busy/has errands to do    Residual limb condition   wound is closed with dry scab over it. pt with bruise over patella area. Reports she uses her knee to balance herself when prosthesis is off (props knee on a surfact to act as a support/another leg). Also reports she has crawled on her knees in the past to get where she needs to go, "not doing that much now".     Education Provided  Residual limb care;Proper wear schedule/adjustment;Proper weight-bearing schedule/adjustment;Proper Donning   changing shoes- see gait comments   Person(s) Educated  Patient    Education Method  Explanation;Demonstration;Verbal cues;Handout    Education Method  Verbalized understanding;Returned demonstration;Verbal cues required;Needs further instruction    Donning Prosthesis  Supervision    Doffing Prosthesis  Supervision               PT Short Term Goals - 03/26/20 1321      PT SHORT TERM GOAL #1   Title  Patient demonstrates proper  donning & verbalizes proper cleaning of prosthesis. (All STGs Target Date: 04/25/2020)    Time  1    Period  Months    Status  New    Target Date  03/25/20      PT SHORT TERM GOAL #2   Title  Patient tolerates wear of prosthesis >8hrs total / day without wound increasing.    Time  1    Period  Months    Status  New    Target Date  03/25/20      PT SHORT TERM GOAL #3  Title  Standing balance tasks with intermittent UE support on RW reaching 10" anteriorly, picking up items from floor, scanning environment & managing pants for toileting with supervision.    Time  1    Period  Months    Status  New    Target Date  03/25/20      PT SHORT TERM GOAL #4   Title  Patient ambulates 200' with RW & prosthesis with verbal cues for gait deviations & no physical assist or balance loss.    Time  1    Period  Months    Status  New    Target Date  03/25/20      PT SHORT TERM GOAL #5   Title  Patient negotiates stairs with single rail, ramps & curbs with RW & prosthesis with minA.    Time  1    Period  Months    Status  New    Target Date  03/25/20        PT Long Term Goals - 03/26/20 1313      PT LONG TERM GOAL #1   Title  Patient verbalizes & demonstrates understanding of prosthetic care & diabetic foot care to enable safe utilization. (All LTGs Target Date: 06/24/2020)    Time  3    Period  Months    Status  New    Target Date  06/24/20      PT LONG TERM GOAL #2   Title  Patient tolerates prosthesis wear >90% of awake hours without skin or residual limb pain to enable function throughout her day.    Time  3    Period  Months    Status  New    Target Date  06/24/20      PT LONG TERM GOAL #3   Title  Berg Balance >/= 45/56 to indicate lower fall risk.    Time  3    Period  Months    Status  New    Target Date  06/24/20      PT LONG TERM GOAL #4   Title  Patient ambulates 500' outdoors including grass with cane or less & prosthesis modified independent to enable community  mobility.    Time  3    Period  Months    Status  New    Target Date  06/24/20      PT LONG TERM GOAL #5   Title  Patient negotiates ramps, curbs & stairs with single rail with cane or less & prosthesis modified independent to enable community access.    Time  3    Period  Months    Status  New    Target Date  06/24/20      Additional Long Term Goals   Additional Long Term Goals  Yes      PT LONG TERM GOAL #6   Title  Patient demonstrates skills to enable her to return to gardening including kneeling with UE support like garden kneel bench or chair.    Time  3    Period  Months    Status  New    Target Date  06/24/20            Plan - 04/11/20 1455    Clinical Impression Statement  Today's skilled session continued to focus on prosthetic education and gait with RW/prosthesis. Pt was able to increase both her consecutive and overall gait distance this session. Did report that it felt like the prosthesis was "  pushing" her knee back. Pt noted to have changed shoes again. Reviewed with pt again what happens if soles of shoes are different and how it affects the knee. The pt is to bring in the shoes the prosthesis was deliverd with for gait assessment/training with them. If after changing to the shoes the prosthesis continues to push back into the knee, the pt may be in need of adjusments to the prosthesis. The pt is progressing toward goals and should benefit from continued PT to progress toward unmet goals.    Personal Factors and Comorbidities  Age;Comorbidity 3+;Fitness;Time since onset of injury/illness/exacerbation;Transportation    Comorbidities  Right TTA, HTN, DM, CVA, PAD, HLD, CKD st2,    Examination-Activity Limitations  Locomotion Level;Stairs;Stand;Transfers;Toileting    Examination-Participation Restrictions  Community Activity;Yard Work;Shop    Stability/Clinical Decision Making  Evolving/Moderate complexity    Rehab Potential  Good    PT Frequency  2x / week    PT  Duration  12 weeks    PT Treatment/Interventions  ADLs/Self Care Home Management;DME Instruction;Gait training;Stair training;Functional mobility training;Therapeutic activities;Therapeutic exercise;Balance training;Neuromuscular re-education;Patient/family education;Prosthetic Training;Vestibular    PT Next Visit Plan  gait with shoes the prosthesis was delived with, if contineus to push into her knee may need to have adjustments done. continue with gait with RW/prosthesis. Initiate barriers.    Consulted and Agree with Plan of Care  Patient       Patient will benefit from skilled therapeutic intervention in order to improve the following deficits and impairments:  Abnormal gait, Decreased activity tolerance, Decreased balance, Decreased endurance, Decreased knowledge of use of DME, Decreased mobility, Decreased skin integrity, Decreased strength, Postural dysfunction, Prosthetic Dependency, Pain  Visit Diagnosis: Unsteadiness on feet  Weakness generalized  Other abnormalities of gait and mobility     Problem List Patient Active Problem List   Diagnosis Date Noted  . Acute metabolic encephalopathy 20/35/5974  . COVID-19 virus infection 02/08/2020  . Acute kidney injury superimposed on CKD ([place]) 02/08/2020  . Status post below-knee amputation of right lower extremity ([place]) 11/07/2019  . Gangrene of right foot ([place])   . Hyponatremia 10/29/2019  . Chronic diastolic CHF (congestive heart failure) ([place]) 10/29/2019  . Stable proliferative diabetic retinopathy of both eyes associated with type 2 diabetes mellitus ([place]) 06/05/2018  . Acute CVA (cerebrovascular accident) ([place]) 02/18/2017  . Acute ischemic stroke ([place])   . Internuclear ophthalmoplegia of left eye   . Benign paroxysmal positional vertigo   . Abnormality of gait   . Acute onset of severe vertigo 02/17/2017  . Vertigo 02/17/2017  . Atherosclerosis of native artery of extremity with intermittent claudication ([place]) 03/11/2014   . Diabetic retinopathy ([place]) 03/11/2014  . Retinal hemorrhage due to secondary diabetes ([place]) 03/11/2014  . Type II diabetes mellitus with renal manifestations, uncontrolled ([place]) 03/11/2014  . Chronic hepatitis C without hepatic coma ([place]) 03/11/2014  . Hyperlipidemia 03/11/2014  . Hypoglycemia 04/16/2013  . Disorder of bone and cartilage, unspecified   . Other and unspecified hyperlipidemia   . Essential hypertension, benign   . Atherosclerosis of native artery of extremity ([place])   . Chronic kidney disease (CKD), stage II (mild)   . Peripheral arterial disease ([place])   . Anemia   . Postherpetic neuralgia   . Hypertension     [facility] [place], [place], [place] [address], [place], [place] [address] [phone] 04/11/20, 5:49 PM    Name: Monique Cabrera MRN: [id number] Date of Birth: 1935-12-03

## 2020-04-14 ENCOUNTER — Encounter: Payer: Self-pay | Admitting: Rehabilitation

## 2020-04-14 ENCOUNTER — Ambulatory Visit: Payer: Medicare Other | Admitting: Rehabilitation

## 2020-04-14 ENCOUNTER — Other Ambulatory Visit: Payer: Self-pay

## 2020-04-14 DIAGNOSIS — R2681 Unsteadiness on feet: Secondary | ICD-10-CM

## 2020-04-14 DIAGNOSIS — R293 Abnormal posture: Secondary | ICD-10-CM

## 2020-04-14 DIAGNOSIS — R531 Weakness: Secondary | ICD-10-CM | POA: Diagnosis not present

## 2020-04-14 DIAGNOSIS — R2689 Other abnormalities of gait and mobility: Secondary | ICD-10-CM

## 2020-04-14 NOTE — Therapy (Signed)
[facility] [address] [place], [place], [address] Phone: [phone]   Fax:  [phone]  Physical Therapy Treatment  Patient Details  Name: Monique Cabrera MRN: [id number] Date of Birth: [birth year] Referring Provider (PT): Curt Jews, MD   Encounter Date: 04/14/2020  PT End of Session - 04/14/20 1515    Visit Number  5    Number of Visits  25    Date for PT Re-Evaluation  06/24/20    Authorization Type  UHC Medicare & Medicaid    PT Start Time  1402    PT Stop Time  1445    PT Time Calculation (min)  43 min    Equipment Utilized During Treatment  Gait belt    Activity Tolerance  Patient tolerated treatment well;No increased pain    Behavior During Therapy  WFL for tasks assessed/performed       Past Medical History:  Diagnosis Date  . Acute upper respiratory infections of unspecified site   . Amputee 08/2019  . Anemia   . Anemia, unspecified   . Atherosclerosis of native arteries of the extremities, unspecified   . Chest pain, unspecified   . Chronic kidney disease (CKD), stage II (mild)   . Diarrhea   . Disorder of bone and cartilage, unspecified   . Herpes zoster with other nervous system complications(053.19)   . Hypercalcemia   . Hypertension   . Hypertensive renal disease, benign   . Nonspecific reaction to tuberculin skin test without active tuberculosis(795.51)   . Other and unspecified hyperlipidemia   . PAD (peripheral artery disease) ([place])    Per records from [facility]  . Pain in joint, lower leg   . Peripheral arterial disease ([place])   . Postherpetic neuralgia   . Proteinuria   . Stroke ([place]) 01/2017  . Type II or unspecified type diabetes mellitus with renal manifestations, uncontrolled(250.42)   . Unspecified disorder of kidney and ureter     Past Surgical History:  Procedure Laterality Date  . ABDOMINAL AORTOGRAM W/LOWER EXTREMITY N/A 10/31/2019   Procedure: ABDOMINAL AORTOGRAM  W/LOWER EXTREMITY;  Surgeon: Wellington Hampshire, MD;  Location: [facility];  Service: Cardiovascular;  Laterality: N/A;  . AMPUTATION Right 11/02/2019   Procedure: AMPUTATION BELOW KNEE RIGHT;  Surgeon: Rosetta Posner, MD;  Location: [facility];  Service: Vascular;  Laterality: Right;  . hysterectomy    . INCISION AND DRAINAGE Left 05/27/14   sebacous cyst, ear  . PRP Left    Dr. Ricki Miller  . removal of cyst from hand    . removal of tumor from foot    . TONSILLECTOMY      There were no vitals filed for this visit.  Subjective Assessment - 04/14/20 1404    Subjective  Pt present today with shoes that prosthesis were fitted for.  No other changes.    Limitations  Lifting;Standing;Walking;House hold activities    Patient Stated Goals  To use prosthesis to walk without device, take care of myself, live alone. Go out in community.    Currently in Pain?  No/denies                       [facility] Adult PT Treatment/Exercise - 04/14/20 1506      Transfers   Transfers  Sit to Stand;Stand to Sit    Sit to Stand  5: Supervision;With upper extremity assist;With armrests;From chair/3-in-1;Other (comment)    Sit to Stand Details  Verbal cues for sequencing;Verbal cues for technique;Verbal cues for safe use of DME/AE    Stand to Sit  5: Supervision;With upper extremity assist;With armrests;To chair/3-in-1;Other (comment)    Stand to Sit Details (indicate cue type and reason)  Verbal cues for sequencing;Verbal cues for technique;Verbal cues for safe use of DME/AE      Ambulation/Gait   Ambulation/Gait  Yes    Ambulation/Gait Assistance  4: Min guard    Ambulation/Gait Assistance Details  Pt with correct shoes today during session.  Note pt adducts RLE during gait and tends to keep R knee flexed to avoid hyperextension.   Upon PT assessing prosthesis, note she is too far into prosthesis therefore went from 1 ply sock to 3 ply sock.  Ambulated another 29' with cues for wider step,  upright posture and forward weight shift onto RLE with increased R knee extension.  Performed further assessment in // bars and performed hip strengthening before ambulating another 54' with RW at min/guard to close S level with marked improvemement in alignment (socket still seemed to be tilted in slightly, but pt does go to [employer]	[place].  PT also tighted prosthetic foot down into shoe as it was moving within shoes causing alignment issues.     Ambulation Distance (Feet)  115 Feet   x 3 reps    Assistive device  Rolling walker;Prosthesis    Gait Pattern  Step-to pattern;Step-through pattern;Decreased stride length;Decreased stance time - right;Decreased step length - left;Narrow base of support;Trunk flexed    Ambulation Surface  Level;Indoor      Therapeutic Activites    Therapeutic Activities  Other Therapeutic Activities    Other Therapeutic Activities  In // bars with primary PT Robin on phone discussing issues with pylon/socket leaning providing valgus moment at knee.   With heavy RLE weight bearing PT able to get her more aligned, also able to place small ball in between legs and improve alignment.  Note R hip weakness, therefore performed seated hip adduction followed by single LE abd with red band, hip add followed by opposite LE abd x 10 reps each.  Pt tolerated well.       Prosthetics   Prosthetic Care Comments   Pt has increased wear time to 4 hrs, 2x/day.  [place] to educate on the need to adjust ply socks throughout the day.      Current prosthetic wear tolerance (days/week)   daily    Current prosthetic wear tolerance (#hours/day)   4 hrs, 2x/day     Current prosthetic weight-bearing tolerance (hours/day)   Pt with some lateral knee pain initially and some sharp phantom pain in R foot, however with adjustments made, this improved during last bout of gait.     Education Provided  Residual limb care;Correct ply sock adjustment;Proper wear schedule/adjustment    Person(s) Educated   Patient    Education Method  Explanation;Verbal cues    Education Method  Verbalized understanding;Needs further instruction    Donning Prosthesis  Supervision    Doffing Prosthesis  Supervision               PT Short Term Goals - 04/14/20 1517      PT SHORT TERM GOAL #1   Title  Patient demonstrates proper donning & verbalizes proper cleaning of prosthesis. (All STGs Target Date: 04/25/2020)    Time  1    Period  Months    Status  New    Target Date  04/25/20  PT SHORT TERM GOAL #2   Title  Patient tolerates wear of prosthesis >8hrs total / day without wound increasing.    Time  1    Period  Months    Status  New      PT SHORT TERM GOAL #3   Title  Standing balance tasks with intermittent UE support on RW reaching 10" anteriorly, picking up items from floor, scanning environment & managing pants for toileting with supervision.    Time  1    Period  Months    Status  New      PT SHORT TERM GOAL #4   Title  Patient ambulates 200' with RW & prosthesis with verbal cues for gait deviations & no physical assist or balance loss.    Time  1    Period  Months    Status  New      PT SHORT TERM GOAL #5   Title  Patient negotiates stairs with single rail, ramps & curbs with RW & prosthesis with minA.    Time  1    Period  Months    Status  New        PT Long Term Goals - 03/26/20 1313      PT LONG TERM GOAL #1   Title  Patient verbalizes & demonstrates understanding of prosthetic care & diabetic foot care to enable safe utilization. (All LTGs Target Date: 06/24/2020)    Time  3    Period  Months    Status  New    Target Date  06/24/20      PT LONG TERM GOAL #2   Title  Patient tolerates prosthesis wear >90% of awake hours without skin or residual limb pain to enable function throughout her day.    Time  3    Period  Months    Status  New    Target Date  06/24/20      PT LONG TERM GOAL #3   Title  Berg Balance >/= 45/56 to indicate lower fall risk.    Time   3    Period  Months    Status  New    Target Date  06/24/20      PT LONG TERM GOAL #4   Title  Patient ambulates 500' outdoors including grass with cane or less & prosthesis modified independent to enable community mobility.    Time  3    Period  Months    Status  New    Target Date  06/24/20      PT LONG TERM GOAL #5   Title  Patient negotiates ramps, curbs & stairs with single rail with cane or less & prosthesis modified independent to enable community access.    Time  3    Period  Months    Status  New    Target Date  06/24/20      Additional Long Term Goals   Additional Long Term Goals  Yes      PT LONG TERM GOAL #6   Title  Patient demonstrates skills to enable her to return to gardening including kneeling with UE support like garden kneel bench or chair.    Time  3    Period  Months    Status  New    Target Date  06/24/20            Plan - 04/14/20 1515    Clinical Impression Statement  Skilled session focused on problem solving  cause of socket/pylon lean.  Pt needing cues to adjust ply socks during session for improved fit.  PT adjusted foot in shoe for more snug fit and provided cues for wider step which all seemed to help her gait.  Still feel that she will need [facility] to look at socket tomorrow when she goes there.  Pt verbalized understanding.    Personal Factors and Comorbidities  Age;Comorbidity 3+;Fitness;Time since onset of injury/illness/exacerbation;Transportation    Comorbidities  Right TTA, HTN, DM, CVA, PAD, HLD, CKD st2,    Examination-Activity Limitations  Locomotion Level;Stairs;Stand;Transfers;Toileting    Examination-Participation Restrictions  Community Activity;Yard Work;Shop    Stability/Clinical Decision Making  Evolving/Moderate complexity    Rehab Potential  Good    PT Frequency  2x / week    PT Duration  12 weeks    PT Treatment/Interventions  ADLs/Self Care Home Management;DME Instruction;Gait training;Stair training;Functional mobility  training;Therapeutic activities;Therapeutic exercise;Balance training;Neuromuscular re-education;Patient/family education;Prosthetic Training;Vestibular    PT Next Visit Plan  She went to [facility]-did they make any adjustments? continue with gait with RW/prosthesis. Initiate barriers.    Consulted and Agree with Plan of Care  Patient       Patient will benefit from skilled therapeutic intervention in order to improve the following deficits and impairments:  Abnormal gait, Decreased activity tolerance, Decreased balance, Decreased endurance, Decreased knowledge of use of DME, Decreased mobility, Decreased skin integrity, Decreased strength, Postural dysfunction, Prosthetic Dependency, Pain  Visit Diagnosis: Unsteadiness on feet  Weakness generalized  Other abnormalities of gait and mobility  Abnormal posture     Problem List Patient Active Problem List   Diagnosis Date Noted  . Acute metabolic encephalopathy 54/65/0354  . COVID-19 virus infection 02/08/2020  . Acute kidney injury superimposed on CKD ([place]) 02/08/2020  . Status post below-knee amputation of right lower extremity ([place]) 11/07/2019  . Gangrene of right foot ([place])   . Hyponatremia 10/29/2019  . Chronic diastolic CHF (congestive heart failure) ([place]) 10/29/2019  . Stable proliferative diabetic retinopathy of both eyes associated with type 2 diabetes mellitus ([place]) 06/05/2018  . Acute CVA (cerebrovascular accident) ([place]) 02/18/2017  . Acute ischemic stroke ([place])   . Internuclear ophthalmoplegia of left eye   . Benign paroxysmal positional vertigo   . Abnormality of gait   . Acute onset of severe vertigo 02/17/2017  . Vertigo 02/17/2017  . Atherosclerosis of native artery of extremity with intermittent claudication ([place]) 03/11/2014  . Diabetic retinopathy ([place]) 03/11/2014  . Retinal hemorrhage due to secondary diabetes ([place]) 03/11/2014  . Type II diabetes mellitus with renal manifestations, uncontrolled ([place]) 03/11/2014   . Chronic hepatitis C without hepatic coma ([place]) 03/11/2014  . Hyperlipidemia 03/11/2014  . Hypoglycemia 04/16/2013  . Disorder of bone and cartilage, unspecified   . Other and unspecified hyperlipidemia   . Essential hypertension, benign   . Atherosclerosis of native artery of extremity ([place])   . Chronic kidney disease (CKD), stage II (mild)   . Peripheral arterial disease ([place])   . Anemia   . Postherpetic neuralgia   . Hypertension     Cameron Sprang, PT, MPT [facility] [address] [place], [place], [address] Phone: [phone]   Fax:  [phone] 04/14/20, 3:18 PM  Name: SIGRID SCHWEBACH MRN: [id number] Date of Birth: Dec 29, 1934

## 2020-04-16 ENCOUNTER — Encounter: Payer: Self-pay | Admitting: Physical Therapy

## 2020-04-16 ENCOUNTER — Other Ambulatory Visit: Payer: Self-pay

## 2020-04-16 ENCOUNTER — Ambulatory Visit: Payer: Medicare Other | Admitting: Physical Therapy

## 2020-04-16 DIAGNOSIS — R531 Weakness: Secondary | ICD-10-CM

## 2020-04-16 DIAGNOSIS — R2689 Other abnormalities of gait and mobility: Secondary | ICD-10-CM | POA: Diagnosis not present

## 2020-04-16 DIAGNOSIS — R2681 Unsteadiness on feet: Secondary | ICD-10-CM | POA: Diagnosis not present

## 2020-04-16 DIAGNOSIS — R293 Abnormal posture: Secondary | ICD-10-CM | POA: Diagnosis not present

## 2020-04-17 NOTE — Therapy (Signed)
[facility] [address] [place], [place], [address] Phone: [phone]   Fax:  [phone]  Physical Therapy Treatment  Patient Details  Name: Monique Cabrera MRN: [id number] Date of Birth: 04/11/1935 Referring Provider (PT): Curt Jews, MD   Encounter Date: 04/16/2020  PT End of Session - 04/16/20 1240    Visit Number  6    Number of Visits  25    Date for PT Re-Evaluation  06/24/20    Authorization Type  UHC Medicare & Medicaid    PT Start Time  1231    PT Stop Time  1315    PT Time Calculation (min)  44 min    Equipment Utilized During Treatment  --    Activity Tolerance  Patient tolerated treatment well;No increased pain    Behavior During Therapy  WFL for tasks assessed/performed       Past Medical History:  Diagnosis Date  . Acute upper respiratory infections of unspecified site   . Amputee 08/2019  . Anemia   . Anemia, unspecified   . Atherosclerosis of native arteries of the extremities, unspecified   . Chest pain, unspecified   . Chronic kidney disease (CKD), stage II (mild)   . Diarrhea   . Disorder of bone and cartilage, unspecified   . Herpes zoster with other nervous system complications(053.19)   . Hypercalcemia   . Hypertension   . Hypertensive renal disease, benign   . Nonspecific reaction to tuberculin skin test without active tuberculosis(795.51)   . Other and unspecified hyperlipidemia   . PAD (peripheral artery disease) ([place])    Per records from [facility]  . Pain in joint, lower leg   . Peripheral arterial disease ([place])   . Postherpetic neuralgia   . Proteinuria   . Stroke ([place]) 01/2017  . Type II or unspecified type diabetes mellitus with renal manifestations, uncontrolled(250.42)   . Unspecified disorder of kidney and ureter     Past Surgical History:  Procedure Laterality Date  . ABDOMINAL AORTOGRAM W/LOWER EXTREMITY N/A 10/31/2019   Procedure: ABDOMINAL AORTOGRAM W/LOWER  EXTREMITY;  Surgeon: Wellington Hampshire, MD;  Location: [facility];  Service: Cardiovascular;  Laterality: N/A;  . AMPUTATION Right 11/02/2019   Procedure: AMPUTATION BELOW KNEE RIGHT;  Surgeon: Rosetta Posner, MD;  Location: [facility];  Service: Vascular;  Laterality: Right;  . hysterectomy    . INCISION AND DRAINAGE Left 05/27/14   sebacous cyst, ear  . PRP Left    Dr. Ricki Miller  . removal of cyst from hand    . removal of tumor from foot    . TONSILLECTOMY      There were no vitals filed for this visit.  Subjective Assessment - 04/16/20 1235    Subjective  No falls. Can't get her prosthesis on since having adjustments yesterday. Been walking around the house without issues. Did sleep without her shrinker last night.    Limitations  Lifting;Standing;Walking;House hold activities    Patient Stated Goals  To use prosthesis to walk without device, take care of myself, live alone. Go out in community.    Currently in Pain?  No/denies    Pain Score  0-No pain              [facility] Adult PT Treatment/Exercise - 04/16/20 1242      Transfers   Transfers  Sit to Stand;Stand to Sit    Sit to Stand  4: Min guard;With upper extremity assist;From  chair/3-in-1    Sit to Stand Details  Verbal cues for sequencing;Verbal cues for technique;Verbal cues for safe use of DME/AE    Stand to Sit  4: Min guard;With upper extremity assist;To chair/3-in-1    Stand to Sit Details (indicate cue type and reason)  Verbal cues for sequencing;Verbal cues for technique;Verbal cues for safe use of DME/AE    Comments  several stands in session in attempt to fully engage pin in socket to don prosthesis without success.       Ambulation/Gait   Ambulation/Gait  --      Exercises   Exercises  Other Exercises    Other Exercises   Scifit level 1.5 x 5 minutes with LE's only in attempt to pump fluid off residual limb to engage pin in socket. No success after initial 5 minutes. added ace wrap to distal limb and had  pt work another 5 minutes with LE's only. Still unable to engage pin in socket to don prosthesis.       Prosthetics   Prosthetic Care Comments   discussed skin integrety with increased wear and need to dry limb/liner every 3 hours. Went over signs of seating, pt will need further reinforcement of this. Discussed process with use of shrinker at home to attempt to don prosthesis and what to do should she not be able too. Refer to clinical impression statement for full details.     Current prosthetic wear tolerance (days/week)   daily    Current prosthetic wear tolerance (#hours/day)   4 hrs, 2x/day, sometimes goes the whole 8 hours without a break.     Residual limb condition   wound is healed with dry edges.     Education Provided  Residual limb care;Correct ply sock adjustment;Proper wear schedule/adjustment;Proper Donning    Person(s) Educated  Patient    Education Method  Explanation;Demonstration;Verbal cues    Education Method  Verbalized understanding;Returned demonstration;Verbal cues required;Needs further instruction    Donning Prosthesis  Minimal assist    Doffing Prosthesis  Supervision               PT Short Term Goals - 04/14/20 1517      PT SHORT TERM GOAL #1   Title  Patient demonstrates proper donning & verbalizes proper cleaning of prosthesis. (All STGs Target Date: 04/25/2020)    Time  1    Period  Months    Status  New    Target Date  04/25/20      PT SHORT TERM GOAL #2   Title  Patient tolerates wear of prosthesis >8hrs total / day without wound increasing.    Time  1    Period  Months    Status  New      PT SHORT TERM GOAL #3   Title  Standing balance tasks with intermittent UE support on RW reaching 10" anteriorly, picking up items from floor, scanning environment & managing pants for toileting with supervision.    Time  1    Period  Months    Status  New      PT SHORT TERM GOAL #4   Title  Patient ambulates 200' with RW & prosthesis with verbal cues  for gait deviations & no physical assist or balance loss.    Time  1    Period  Months    Status  New      PT SHORT TERM GOAL #5   Title  Patient negotiates stairs with single rail, ramps &  curbs with RW & prosthesis with minA.    Time  1    Period  Months    Status  New        PT Long Term Goals - 03/26/20 1313      PT LONG TERM GOAL #1   Title  Patient verbalizes & demonstrates understanding of prosthetic care & diabetic foot care to enable safe utilization. (All LTGs Target Date: 06/24/2020)    Time  3    Period  Months    Status  New    Target Date  06/24/20      PT LONG TERM GOAL #2   Title  Patient tolerates prosthesis wear >90% of awake hours without skin or residual limb pain to enable function throughout her day.    Time  3    Period  Months    Status  New    Target Date  06/24/20      PT LONG TERM GOAL #3   Title  Berg Balance >/= 45/56 to indicate lower fall risk.    Time  3    Period  Months    Status  New    Target Date  06/24/20      PT LONG TERM GOAL #4   Title  Patient ambulates 500' outdoors including grass with cane or less & prosthesis modified independent to enable community mobility.    Time  3    Period  Months    Status  New    Target Date  06/24/20      PT LONG TERM GOAL #5   Title  Patient negotiates ramps, curbs & stairs with single rail with cane or less & prosthesis modified independent to enable community access.    Time  3    Period  Months    Status  New    Target Date  06/24/20      Additional Long Term Goals   Additional Long Term Goals  Yes      PT LONG TERM GOAL #6   Title  Patient demonstrates skills to enable her to return to gardening including kneeling with UE support like garden kneel bench or chair.    Time  3    Period  Months    Status  New    Target Date  06/24/20            Plan - 04/16/20 1240    Clinical Impression Statement  Today's skilled session continued to focus on prosthetic eduction and need for  pt to wear shrinker at night. Attempted to "pump" fluid off limb to don prostheis this session without success. Pt is to don her shrinker on arrival at home and after 3 hours try to don prosthesis. If unable to at this time she is to put her shrinker back on for the night and try again with prosthesis in am. If she is still unable to don prosthesis at this time pt was advised to call the prosthetist to have pads removed from inside the socket. The pt verbalized understanding of these instructions. The pt should benefit from continued PT to progress toward unmet goals.    Personal Factors and Comorbidities  Age;Comorbidity 3+;Fitness;Time since onset of injury/illness/exacerbation;Transportation    Comorbidities  Right TTA, HTN, DM, CVA, PAD, HLD, CKD st2,    Examination-Activity Limitations  Locomotion Level;Stairs;Stand;Transfers;Toileting    Examination-Participation Restrictions  Community Activity;Yard Work;Shop    Stability/Clinical Decision Making  Evolving/Moderate complexity    Rehab Potential  Good    PT Frequency  2x / week    PT Duration  12 weeks    PT Treatment/Interventions  ADLs/Self Care Home Management;DME Instruction;Gait training;Stair training;Functional mobility training;Therapeutic activities;Therapeutic exercise;Balance training;Neuromuscular re-education;Patient/family education;Prosthetic Training;Vestibular    PT Next Visit Plan  assess gait with prosthesis since [facility] made adjustments (unable to don prothesis at last session due to limb edema), continue with gait training with RW/prosthesis. Initiate barriers with RW/prosthesis    Consulted and Agree with Plan of Care  Patient       Patient will benefit from skilled therapeutic intervention in order to improve the following deficits and impairments:  Abnormal gait, Decreased activity tolerance, Decreased balance, Decreased endurance, Decreased knowledge of use of DME, Decreased mobility, Decreased skin integrity, Decreased  strength, Postural dysfunction, Prosthetic Dependency, Pain  Visit Diagnosis: Unsteadiness on feet  Weakness generalized  Other abnormalities of gait and mobility  Abnormal posture     Problem List Patient Active Problem List   Diagnosis Date Noted  . Acute metabolic encephalopathy 79/48/0165  . COVID-19 virus infection 02/08/2020  . Acute kidney injury superimposed on CKD ([place]) 02/08/2020  . Status post below-knee amputation of right lower extremity ([place]) 11/07/2019  . Gangrene of right foot ([place])   . Hyponatremia 10/29/2019  . Chronic diastolic CHF (congestive heart failure) ([place]) 10/29/2019  . Stable proliferative diabetic retinopathy of both eyes associated with type 2 diabetes mellitus ([place]) 06/05/2018  . Acute CVA (cerebrovascular accident) ([place]) 02/18/2017  . Acute ischemic stroke ([place])   . Internuclear ophthalmoplegia of left eye   . Benign paroxysmal positional vertigo   . Abnormality of gait   . Acute onset of severe vertigo 02/17/2017  . Vertigo 02/17/2017  . Atherosclerosis of native artery of extremity with intermittent claudication ([place]) 03/11/2014  . Diabetic retinopathy ([place]) 03/11/2014  . Retinal hemorrhage due to secondary diabetes ([place]) 03/11/2014  . Type II diabetes mellitus with renal manifestations, uncontrolled ([place]) 03/11/2014  . Chronic hepatitis C without hepatic coma ([place]) 03/11/2014  . Hyperlipidemia 03/11/2014  . Hypoglycemia 04/16/2013  . Disorder of bone and cartilage, unspecified   . Other and unspecified hyperlipidemia   . Essential hypertension, benign   . Atherosclerosis of native artery of extremity ([place])   . Chronic kidney disease (CKD), stage II (mild)   . Peripheral arterial disease ([place])   . Anemia   . Postherpetic neuralgia   . Hypertension     [place], [place], [place] [address], [place], [place] [address] [phone] 04/17/20, 8:34 AM   Name: Monique Cabrera MRN: [id number] Date  of Birth: Aug 14, 1935

## 2020-04-22 ENCOUNTER — Encounter: Payer: Self-pay | Admitting: Physical Therapy

## 2020-04-22 ENCOUNTER — Other Ambulatory Visit: Payer: Self-pay

## 2020-04-22 ENCOUNTER — Ambulatory Visit: Payer: Medicare Other | Admitting: Physical Therapy

## 2020-04-22 DIAGNOSIS — R2689 Other abnormalities of gait and mobility: Secondary | ICD-10-CM

## 2020-04-22 DIAGNOSIS — R531 Weakness: Secondary | ICD-10-CM

## 2020-04-22 DIAGNOSIS — R2681 Unsteadiness on feet: Secondary | ICD-10-CM | POA: Diagnosis not present

## 2020-04-22 DIAGNOSIS — R293 Abnormal posture: Secondary | ICD-10-CM

## 2020-04-23 NOTE — Therapy (Addendum)
[facility] [address] [place], [place], [address] Phone: [phone]   Fax:  [phone]  Physical Therapy Treatment  Patient Details  Name: Monique Cabrera MRN: [id number] Date of Birth: Jul 13, 1935 Referring Provider (PT): Curt Jews, MD   Encounter Date: 04/22/2020  PT End of Session - 04/22/20 1630    Visit Number  7    Number of Visits  25    Date for PT Re-Evaluation  06/24/20    Authorization Type  [employer] Medicare & Medicaid    PT Start Time  1446    PT Stop Time  1528    PT Time Calculation (min)  42 min    Equipment Utilized During Treatment  Gait belt    Activity Tolerance  Patient tolerated treatment well    Behavior During Therapy  WFL for tasks assessed/performed       Past Medical History:  Diagnosis Date  . Acute upper respiratory infections of unspecified site   . Amputee 08/2019  . Anemia   . Anemia, unspecified   . Atherosclerosis of native arteries of the extremities, unspecified   . Chest pain, unspecified   . Chronic kidney disease (CKD), stage II (mild)   . Diarrhea   . Disorder of bone and cartilage, unspecified   . Herpes zoster with other nervous system complications(053.19)   . Hypercalcemia   . Hypertension   . Hypertensive renal disease, benign   . Nonspecific reaction to tuberculin skin test without active tuberculosis(795.51)   . Other and unspecified hyperlipidemia   . PAD (peripheral artery disease) ([place])    Per records from [name]  . Pain in joint, lower leg   . Peripheral arterial disease ([place])   . Postherpetic neuralgia   . Proteinuria   . Stroke ([place]) 01/2017  . Type II or unspecified type diabetes mellitus with renal manifestations, uncontrolled(250.42)   . Unspecified disorder of kidney and ureter     Past Surgical History:  Procedure Laterality Date  . ABDOMINAL AORTOGRAM W/LOWER EXTREMITY N/A 10/31/2019   Procedure: ABDOMINAL AORTOGRAM W/LOWER EXTREMITY;   Surgeon: Wellington Hampshire, MD;  Location: [facility];  Service: Cardiovascular;  Laterality: N/A;  . AMPUTATION Right 11/02/2019   Procedure: AMPUTATION BELOW KNEE RIGHT;  Surgeon: Rosetta Posner, MD;  Location: [facility];  Service: Vascular;  Laterality: Right;  . hysterectomy    . INCISION AND DRAINAGE Left 05/27/14   sebacous cyst, ear  . PRP Left    Dr. Ricki Miller  . removal of cyst from hand    . removal of tumor from foot    . TONSILLECTOMY      There were no vitals filed for this visit.  Subjective Assessment - 04/22/20 1450    Subjective  Did get the prosthesis on the next day. Has issues if she has too many socks on .    Limitations  Lifting;Standing;Walking;House hold activities    Patient Stated Goals  To use prosthesis to walk without device, take care of myself, live alone. Go out in community.    Currently in Pain?  No/denies            [facility] Adult PT Treatment/Exercise - 04/22/20 1453      Transfers   Transfers  Sit to Stand;Stand to Sit    Sit to Stand  4: Min guard;With upper extremity assist;From chair/3-in-1    Stand to Sit  4: Min guard;With upper extremity assist;To chair/3-in-1  Ambulation/Gait   Ambulation/Gait  Yes    Ambulation/Gait Assistance  4: Min guard    Ambulation/Gait Assistance Details  pt noted to be on toes of prosthesis and unable to extend knee. after ~100 feet pt reported pressure at her knee as well. Pt also noted to still have a medial pylon lean. Pt also noted to have lateral hip weakness with decreased control in stance.      Ambulation Distance (Feet)  220 Feet   x1, 50 x1   Assistive device  Rolling walker;Prosthesis    Gait Pattern  Step-to pattern;Step-through pattern;Decreased stride length;Decreased stance time - right;Decreased step length - left;Narrow base of support;Trunk flexed    Ambulation Surface  Level;Indoor      Therapeutic Activites    Therapeutic Activities  Other Therapeutic Activities    Other  Therapeutic Activities  with RW as needed: pt able to reach ~10 inches forward, pt was able to retrieve item from floor and turn to look over each shoulder, all with min guard assist. pt discribed how she manages her clothing for toileting with prosthesis on, therefore did not simulate in session.       Prosthetics   Current prosthetic wear tolerance (days/week)   daily    Current prosthetic wear tolerance (#hours/day)   "I don't really keep a schedule". is up in bed early, however not out of bed till 10-10:30. Goes to eat breakfast and then don's prosthesis afterwards around 11ish. Wears it till about 1pm. After a break she puts it back on around 2pm. Then keeps it on till 5-7 pm.     Residual limb condition   intact with no issues. dry scab over old wound on tibial crest    Education Provided  Residual limb care;Proper Donning;Proper Doffing;Proper wear schedule/adjustment;Proper weight-bearing schedule/adjustment    Person(s) Educated  Patient    Education Method  Explanation;Demonstration;Verbal cues;Handout    Education Method  Verbalized understanding;Returned demonstration;Verbal cues required;Needs further instruction    Donning Prosthesis  Minimal assist    Doffing Prosthesis  Supervision            PT Short Term Goals - 04/22/20 1459      PT SHORT TERM GOAL #1   Title  Patient demonstrates proper donning & verbalizes proper cleaning of prosthesis. (All STGs Target Date: 04/25/2020)    Baseline  04/22/20: met to date    Status  Achieved    Target Date  04/25/20      PT SHORT TERM GOAL #2   Title  Patient tolerates wear of prosthesis >8hrs total / day without wound increasing.    Baseline  04/22/20: working towards this goal, currently wearing 3-4 hours 2x a day    Status  Partially Met      PT SHORT TERM GOAL #3   Title  Standing balance tasks with intermittent UE support on RW reaching 10" anteriorly, picking up items from floor, scanning environment & managing pants for  toileting with supervision.    Baseline  04/22/20: pt able to report how she manages pants at home for toileting with prosthesis on; demo'd all other aspects in session today    Time  --    Period  --    Status  Achieved      PT SHORT TERM GOAL #4   Title  Patient ambulates 200' with RW & prosthesis with verbal cues for gait deviations & no physical assist or balance loss.    Baseline  04/22/20: met in  session today    Time  --    Period  --    Status  Achieved      PT SHORT TERM GOAL #5   Title  Patient negotiates stairs with single rail, ramps & curbs with RW & prosthesis with minA.    Baseline  04/22/20: no education on these todate    Time  --    Period  --    Status  Unable to assess        PT Long Term Goals - 03/26/20 1313      PT LONG TERM GOAL #1   Title  Patient verbalizes & demonstrates understanding of prosthetic care & diabetic foot care to enable safe utilization. (All LTGs Target Date: 06/24/2020)    Time  3    Period  Months    Status  New    Target Date  06/24/20      PT LONG TERM GOAL #2   Title  Patient tolerates prosthesis wear >90% of awake hours without skin or residual limb pain to enable function throughout her day.    Time  3    Period  Months    Status  New    Target Date  06/24/20      PT LONG TERM GOAL #3   Title  Berg Balance >/= 45/56 to indicate lower fall risk.    Time  3    Period  Months    Status  New    Target Date  06/24/20      PT LONG TERM GOAL #4   Title  Patient ambulates 500' outdoors including grass with cane or less & prosthesis modified independent to enable community mobility.    Time  3    Period  Months    Status  New    Target Date  06/24/20      PT LONG TERM GOAL #5   Title  Patient negotiates ramps, curbs & stairs with single rail with cane or less & prosthesis modified independent to enable community access.    Time  3    Period  Months    Status  New    Target Date  06/24/20      Additional Long Term Goals    Additional Long Term Goals  Yes      PT LONG TERM GOAL #6   Title  Patient demonstrates skills to enable her to return to gardening including kneeling with UE support like garden kneel bench or chair.    Time  3    Period  Months    Status  New    Target Date  06/24/20            Plan - 04/22/20 1630    Clinical Impression Statement  Today's skilled session focued on progress toward STGs with goals partially met to fully met except for barriers. Pt has not been educated on barriers (stairs, curb, ramp) to date. Pt also continues with decreased stance control with prosthesis and would benefit from continued PT to address hip strengthening, gait/barriers with prosthesis/RW.    Personal Factors and Comorbidities  Age;Comorbidity 3+;Fitness;Time since onset of injury/illness/exacerbation;Transportation    Comorbidities  Right TTA, HTN, DM, CVA, PAD, HLD, CKD st2,    Examination-Activity Limitations  Locomotion Level;Stairs;Stand;Transfers;Toileting    Examination-Participation Restrictions  Community Activity;Yard Work;Shop    Stability/Clinical Decision Making  Evolving/Moderate complexity    Rehab Potential  Good    PT Frequency  2x / week  PT Duration  12 weeks    PT Treatment/Interventions  ADLs/Self Care Home Management;DME Instruction;Gait training;Stair training;Functional mobility training;Therapeutic activities;Therapeutic exercise;Balance training;Neuromuscular re-education;Patient/family education;Prosthetic Training;Vestibular    PT Next Visit Plan  lateral hip strengthening, continue with gait training with RW/prosthesis. Initiate barriers with RW/prosthesis    Consulted and Agree with Plan of Care  Patient       Patient will benefit from skilled therapeutic intervention in order to improve the following deficits and impairments:  Abnormal gait, Decreased activity tolerance, Decreased balance, Decreased endurance, Decreased knowledge of use of DME, Decreased mobility,  Decreased skin integrity, Decreased strength, Postural dysfunction, Prosthetic Dependency, Pain  Visit Diagnosis: Unsteadiness on feet  Weakness generalized  Other abnormalities of gait and mobility  Abnormal posture     Problem List Patient Active Problem List   Diagnosis Date Noted  . Acute metabolic encephalopathy 33/29/5188  . COVID-19 virus infection 02/08/2020  . Acute kidney injury superimposed on CKD ([place]) 02/08/2020  . Status post below-knee amputation of right lower extremity ([place]) 11/07/2019  . Gangrene of right foot ([place])   . Hyponatremia 10/29/2019  . Chronic diastolic CHF (congestive heart failure) ([place]) 10/29/2019  . Stable proliferative diabetic retinopathy of both eyes associated with type 2 diabetes mellitus ([place]) 06/05/2018  . Acute CVA (cerebrovascular accident) ([place]) 02/18/2017  . Acute ischemic stroke ([place])   . Internuclear ophthalmoplegia of left eye   . Benign paroxysmal positional vertigo   . Abnormality of gait   . Acute onset of severe vertigo 02/17/2017  . Vertigo 02/17/2017  . Atherosclerosis of native artery of extremity with intermittent claudication ([place]) 03/11/2014  . Diabetic retinopathy ([place]) 03/11/2014  . Retinal hemorrhage due to secondary diabetes ([place]) 03/11/2014  . Type II diabetes mellitus with renal manifestations, uncontrolled ([place]) 03/11/2014  . Chronic hepatitis C without hepatic coma ([place]) 03/11/2014  . Hyperlipidemia 03/11/2014  . Hypoglycemia 04/16/2013  . Disorder of bone and cartilage, unspecified   . Other and unspecified hyperlipidemia   . Essential hypertension, benign   . Atherosclerosis of native artery of extremity ([place])   . Chronic kidney disease (CKD), stage II (mild)   . Peripheral arterial disease ([place])   . Anemia   . Postherpetic neuralgia   . Hypertension     [facility], [place], [name] [address] [place], [place] [address] [phone] 04/23/20, 3:09 PM   Name:  Monique Cabrera MRN: [id number] Date of Birth: Nov 21, 1935    PT updated STGs for next 30 days. PT Short Term Goals - 04/30/20 1222      PT SHORT TERM GOAL #1   Title  Patient verbalizes adjusting ply socks with limb volume changes with prosthesis. (All STGs Target Date: 05/23/2020)    Time  1    Period  Months    Status  New    Target Date  05/23/20      PT SHORT TERM GOAL #2   Title  Patient tolerates wear of prosthesis >12 hrs total / day without wound increasing.    Time  1    Period  Months    Status  Revised    Target Date  05/23/20      PT SHORT TERM GOAL #3   Title  Berg Balance >36/56    Time  1    Period  Months    Status  New    Target Date  05/23/20      PT SHORT TERM GOAL #4  Title  Patient ambulates 200' with cane & prosthesis with supervision.    Time  1    Period  Months    Status  Revised    Target Date  05/23/20      PT SHORT TERM GOAL #5   Title  Patient negotiates stairs with single rail, ramps & curbs with cane & prosthesis with minA.    Time  1    Period  Months    Status  Revised    Target Date  05/23/20       Jamey Reas, PT, DPT PT Specializing in [place] 04/30/20 12:26 PM Phone:  [phone]  Fax:  [phone] [facility] [address] [place],  [address]

## 2020-04-24 ENCOUNTER — Ambulatory Visit: Payer: Medicare Other | Admitting: Physical Therapy

## 2020-04-24 ENCOUNTER — Other Ambulatory Visit: Payer: Self-pay

## 2020-04-24 ENCOUNTER — Encounter: Payer: Self-pay | Admitting: Physical Therapy

## 2020-04-24 DIAGNOSIS — R2681 Unsteadiness on feet: Secondary | ICD-10-CM

## 2020-04-24 DIAGNOSIS — R2689 Other abnormalities of gait and mobility: Secondary | ICD-10-CM

## 2020-04-24 DIAGNOSIS — R531 Weakness: Secondary | ICD-10-CM

## 2020-04-24 DIAGNOSIS — R293 Abnormal posture: Secondary | ICD-10-CM | POA: Diagnosis not present

## 2020-04-24 NOTE — Therapy (Signed)
[facility] [address] [place], [place], [address] Phone: [phone]   Fax:  [phone]  Physical Therapy Treatment  Patient Details  Name: DEBROAH SHUTTLEWORTH MRN: [id number] Date of Birth: Oct 31, 1935 Referring Provider (PT): Curt Jews, MD   Encounter Date: 04/24/2020  PT End of Session - 04/24/20 1324    Visit Number  8    Number of Visits  25    Date for PT Re-Evaluation  06/24/20    Authorization Type  [employer] Medicare & Medicaid    PT Start Time  1318    PT Stop Time  1400    PT Time Calculation (min)  42 min    Equipment Utilized During Treatment  Gait belt    Activity Tolerance  Patient tolerated treatment well;No increased pain    Behavior During Therapy  WFL for tasks assessed/performed       Past Medical History:  Diagnosis Date  . Acute upper respiratory infections of unspecified site   . Amputee 08/2019  . Anemia   . Anemia, unspecified   . Atherosclerosis of native arteries of the extremities, unspecified   . Chest pain, unspecified   . Chronic kidney disease (CKD), stage II (mild)   . Diarrhea   . Disorder of bone and cartilage, unspecified   . Herpes zoster with other nervous system complications(053.19)   . Hypercalcemia   . Hypertension   . Hypertensive renal disease, benign   . Nonspecific reaction to tuberculin skin test without active tuberculosis(795.51)   . Other and unspecified hyperlipidemia   . PAD (peripheral artery disease) ([place])    Per records from [facility]  . Pain in joint, lower leg   . Peripheral arterial disease ([place])   . Postherpetic neuralgia   . Proteinuria   . Stroke ([place]) 01/2017  . Type II or unspecified type diabetes mellitus with renal manifestations, uncontrolled(250.42)   . Unspecified disorder of kidney and ureter     Past Surgical History:  Procedure Laterality Date  . ABDOMINAL AORTOGRAM W/LOWER EXTREMITY N/A 10/31/2019   Procedure: ABDOMINAL AORTOGRAM  W/LOWER EXTREMITY;  Surgeon: Wellington Hampshire, MD;  Location: [facility];  Service: Cardiovascular;  Laterality: N/A;  . AMPUTATION Right 11/02/2019   Procedure: AMPUTATION BELOW KNEE RIGHT;  Surgeon: Rosetta Posner, MD;  Location: [facility];  Service: Vascular;  Laterality: Right;  . hysterectomy    . INCISION AND DRAINAGE Left 05/27/14   sebacous cyst, ear  . PRP Left    Dr. Ricki Miller  . removal of cyst from hand    . removal of tumor from foot    . TONSILLECTOMY      There were no vitals filed for this visit.  Subjective Assessment - 04/24/20 1321    Subjective  No new complaitns. Planning to move into daughters house by end of May (her 2 months notice ends at end of May). Has been wearing prosthesis all awake hours with one hour break mid-day. "I love it, I can do so much more with it on". Also did some walking at her grand-son's house on level outdoor surfaces with her daughter with her (was too scared to have them push her in the chair, "she scares me, I'm holding on for life").    Limitations  Lifting;Standing;Walking;House hold activities    Patient Stated Goals  To use prosthesis to walk without device, take care of myself, live alone. Go out in community.    Currently in Pain?  No/denies    Pain Score  0-No pain            [facility] Adult PT Treatment/Exercise - 04/24/20 1324      Transfers   Transfers  Sit to Stand;Stand to Sit    Sit to Stand  4: Min guard;With upper extremity assist;From chair/3-in-1    Sit to Stand Details  Verbal cues for sequencing;Verbal cues for technique;Verbal cues for safe use of DME/AE    Stand to Sit  4: Min guard;With upper extremity assist;To chair/3-in-1    Stand to Sit Details (indicate cue type and reason)  Verbal cues for sequencing;Verbal cues for technique;Verbal cues for safe use of DME/AE      Ambulation/Gait   Ambulation/Gait  Yes    Ambulation/Gait Assistance  4: Min guard    Ambulation/Gait Assistance Details  use of color  bands for step targets at bottom of walker with all gait. cues to step to color (right to red, left to yellow) with improved base of support noted. Pt noted to be rotating on prosthetic side with second lap, sock ply adjusted up to 3 ply with no further rotation noted.     Ambulation Distance (Feet)  115 Feet   x1, 70 x2   Assistive device  Rolling walker;Prosthesis    Gait Pattern  Step-to pattern;Step-through pattern;Decreased stride length;Decreased stance time - right;Decreased step length - left;Narrow base of support;Trunk flexed    Ambulation Surface  Level;Indoor    Stairs  Yes    Stairs Assistance  4: Min guard;4: Min assist    Stairs Assistance Details (indicate cue type and reason)  PTA demo'd technique prior to pt performance. with pt performance cues needed on sequencing, hand advancement along rails and for weight shifting.     Stair Management Technique  Two rails;Step to pattern;Forwards    Number of Stairs  4   x2   Height of Stairs  6      Prosthetics   Prosthetic Care Comments   continues to need cues for sock ply management. with gaith with bands for step placement pt still noted to have medial pylon lean. will plan to work 1-2 more sessions on maintaining good base of support with step placement and hip strengthening prior to sending pt back to [facility] for adjustments so to have pt with best gait possible before making more changes.     Current prosthetic wear tolerance (days/week)   daily    Current prosthetic wear tolerance (#hours/day)   all awake hours with 1 hour mid-day since last session.     Residual limb condition   intact with no issues. dry scab over old wound on tibial crest    Education Provided  Residual limb care;Correct ply sock adjustment;Proper wear schedule/adjustment;Proper weight-bearing schedule/adjustment    Person(s) Educated  Patient    Education Method  Explanation;Demonstration;Verbal cues    Education Method  Verbalized understanding;Returned  demonstration;Verbal cues required;Needs further instruction    Donning Prosthesis  Minimal assist    Doffing Prosthesis  Supervision           PT Short Term Goals - 04/22/20 1459      PT SHORT TERM GOAL #1   Title  Patient demonstrates proper donning & verbalizes proper cleaning of prosthesis. (All STGs Target Date: 04/25/2020)    Baseline  04/22/20: met to date    Status  Achieved    Target Date  04/25/20      PT SHORT TERM GOAL #2  Title  Patient tolerates wear of prosthesis >8hrs total / day without wound increasing.    Baseline  04/22/20: working towards this goal, currently wearing 3-4 hours 2x a day    Status  Partially Met      PT SHORT TERM GOAL #3   Title  Standing balance tasks with intermittent UE support on RW reaching 10" anteriorly, picking up items from floor, scanning environment & managing pants for toileting with supervision.    Baseline  04/22/20: pt able to report how she manages pants at home for toileting with prosthesis on; demo'd all other aspects in session today    Time  --    Period  --    Status  Achieved      PT SHORT TERM GOAL #4   Title  Patient ambulates 200' with RW & prosthesis with verbal cues for gait deviations & no physical assist or balance loss.    Baseline  04/22/20: met in session today    Time  --    Period  --    Status  Achieved      PT SHORT TERM GOAL #5   Title  Patient negotiates stairs with single rail, ramps & curbs with RW & prosthesis with minA.    Baseline  04/22/20: no education on these todate    Time  --    Period  --    Status  Unable to assess        PT Long Term Goals - 03/26/20 1313      PT LONG TERM GOAL #1   Title  Patient verbalizes & demonstrates understanding of prosthetic care & diabetic foot care to enable safe utilization. (All LTGs Target Date: 06/24/2020)    Time  3    Period  Months    Status  New    Target Date  06/24/20      PT LONG TERM GOAL #2   Title  Patient tolerates prosthesis wear >90%  of awake hours without skin or residual limb pain to enable function throughout her day.    Time  3    Period  Months    Status  New    Target Date  06/24/20      PT LONG TERM GOAL #3   Title  Berg Balance >/= 45/56 to indicate lower fall risk.    Time  3    Period  Months    Status  New    Target Date  06/24/20      PT LONG TERM GOAL #4   Title  Patient ambulates 500' outdoors including grass with cane or less & prosthesis modified independent to enable community mobility.    Time  3    Period  Months    Status  New    Target Date  06/24/20      PT LONG TERM GOAL #5   Title  Patient negotiates ramps, curbs & stairs with single rail with cane or less & prosthesis modified independent to enable community access.    Time  3    Period  Months    Status  New    Target Date  06/24/20      Additional Long Term Goals   Additional Long Term Goals  Yes      PT LONG TERM GOAL #6   Title  Patient demonstrates skills to enable her to return to gardening including kneeling with UE support like garden kneel bench or chair.  Time  3    Period  Months    Status  New    Target Date  06/24/20            Plan - 04/24/20 1324    Clinical Impression Statement  Today's skilled session focused on gait mechanics with emphasis on base of support and step placement. Used colored bands for visual target for step placement. Also began instruction on stairs this session. Pt able to negotiate stairs with up to min assist needed. Will benefit from further instruction. The pt is progressing toward goals and should benefit from continued PT to progress toward unmet goals.    Personal Factors and Comorbidities  Age;Comorbidity 3+;Fitness;Time since onset of injury/illness/exacerbation;Transportation    Comorbidities  Right TTA, HTN, DM, CVA, PAD, HLD, CKD st2,    Examination-Activity Limitations  Locomotion Level;Stairs;Stand;Transfers;Toileting    Examination-Participation Restrictions  Community  Activity;Yard Work;Shop    Stability/Clinical Decision Making  Evolving/Moderate complexity    Rehab Potential  Good    PT Frequency  2x / week    PT Duration  12 weeks    PT Treatment/Interventions  ADLs/Self Care Home Management;DME Instruction;Gait training;Stair training;Functional mobility training;Therapeutic activities;Therapeutic exercise;Balance training;Neuromuscular re-education;Patient/family education;Prosthetic Training;Vestibular    PT Next Visit Plan  lateral hip strengthening, continue with gait training with RW/prosthesis with colored bands. Continued with stair negotiation training. Initiate ramps/curbs with RW/prosthesis    Consulted and Agree with Plan of Care  Patient       Patient will benefit from skilled therapeutic intervention in order to improve the following deficits and impairments:  Abnormal gait, Decreased activity tolerance, Decreased balance, Decreased endurance, Decreased knowledge of use of DME, Decreased mobility, Decreased skin integrity, Decreased strength, Postural dysfunction, Prosthetic Dependency, Pain  Visit Diagnosis: Unsteadiness on feet  Weakness generalized  Other abnormalities of gait and mobility     Problem List Patient Active Problem List   Diagnosis Date Noted  . Acute metabolic encephalopathy 12/75/1700  . COVID-19 virus infection 02/08/2020  . Acute kidney injury superimposed on CKD ([place]) 02/08/2020  . Status post below-knee amputation of right lower extremity ([place]) 11/07/2019  . Gangrene of right foot ([place])   . Hyponatremia 10/29/2019  . Chronic diastolic CHF (congestive heart failure) ([place]) 10/29/2019  . Stable proliferative diabetic retinopathy of both eyes associated with type 2 diabetes mellitus ([place]) 06/05/2018  . Acute CVA (cerebrovascular accident) ([place]) 02/18/2017  . Acute ischemic stroke ([place])   . Internuclear ophthalmoplegia of left eye   . Benign paroxysmal positional vertigo   . Abnormality of gait   . Acute  onset of severe vertigo 02/17/2017  . Vertigo 02/17/2017  . Atherosclerosis of native artery of extremity with intermittent claudication ([place]) 03/11/2014  . Diabetic retinopathy ([place]) 03/11/2014  . Retinal hemorrhage due to secondary diabetes ([place]) 03/11/2014  . Type II diabetes mellitus with renal manifestations, uncontrolled ([place]) 03/11/2014  . Chronic hepatitis C without hepatic coma ([place]) 03/11/2014  . Hyperlipidemia 03/11/2014  . Hypoglycemia 04/16/2013  . Disorder of bone and cartilage, unspecified   . Other and unspecified hyperlipidemia   . Essential hypertension, benign   . Atherosclerosis of native artery of extremity ([place])   . Chronic kidney disease (CKD), stage II (mild)   . Peripheral arterial disease ([place])   . Anemia   . Postherpetic neuralgia   . Hypertension     [facility], [place], [place] [address] [place], [place] [address] [phone] 04/24/20, 9:15 PM   Name:  LOYOLA SANTINO MRN: [id number] Date of Birth: Apr 22, 1935

## 2020-04-26 IMAGING — CT CT HEAD W/O CM
4 series · 16 of 47 positions shown, 18 images · non-contrast
Comparison: 02/17/2017

CLINICAL DATA: Altered mental status

EXAM:
CT HEAD WITHOUT CONTRAST
TECHNIQUE: Contiguous axial images were obtained from the base of the skull
through the vertex without intravenous contrast.

[Series 3: head wo · axial · 0.45mm/px · z∈[-125,-5]mm · 7 of 32 slices shown, 9 images]
[im 4/32  brain]
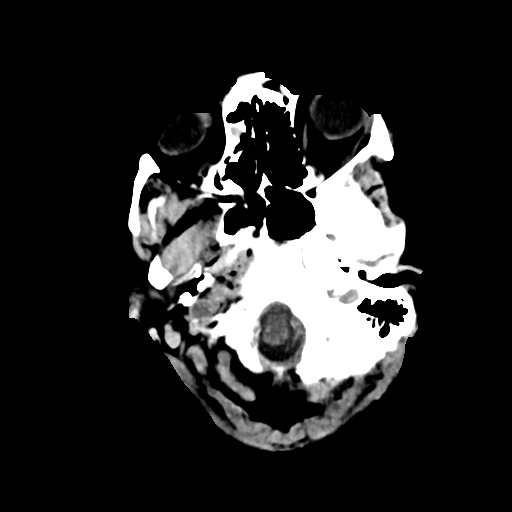
[im 4/32  bone]
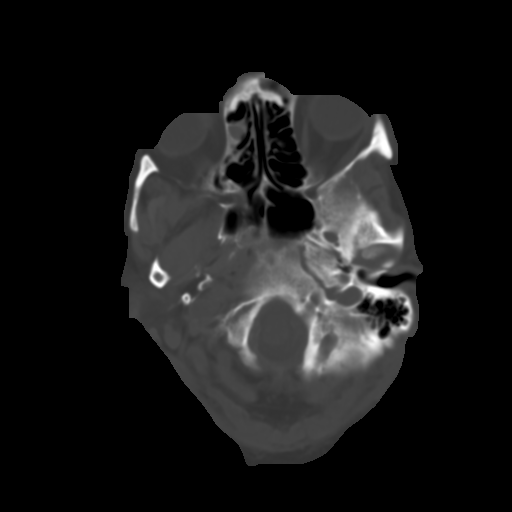
[im 8/32  brain]
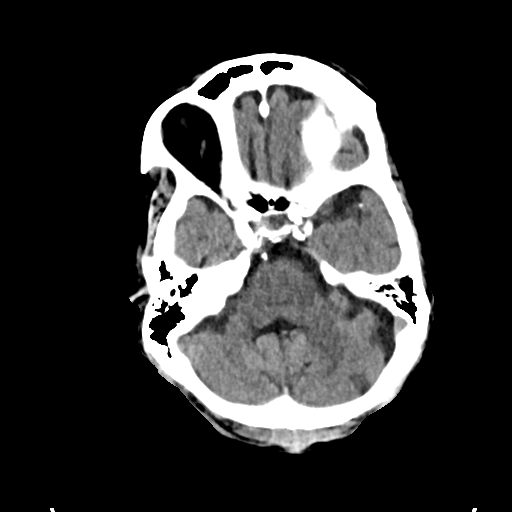
[im 12/32  brain]
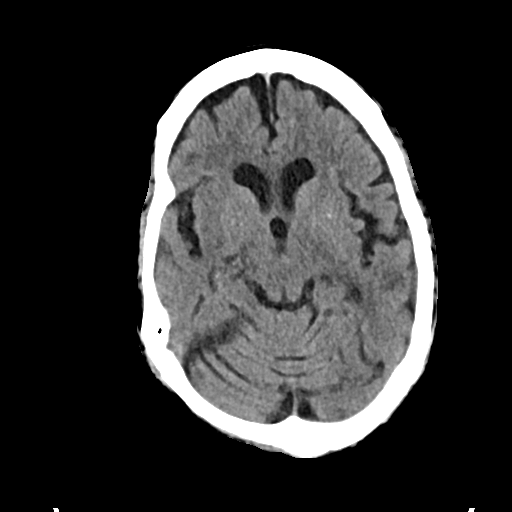
[im 16/32  brain]
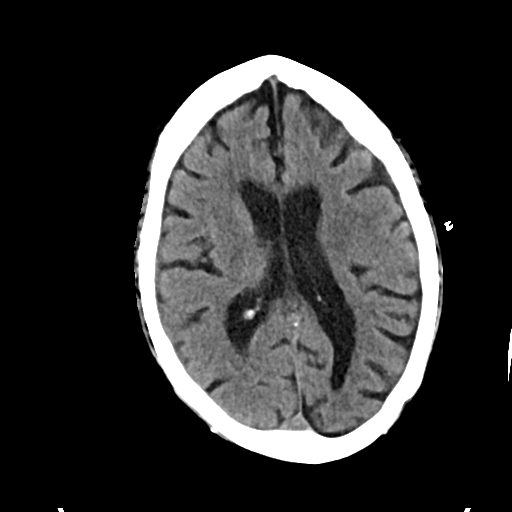
[im 20/32  brain]
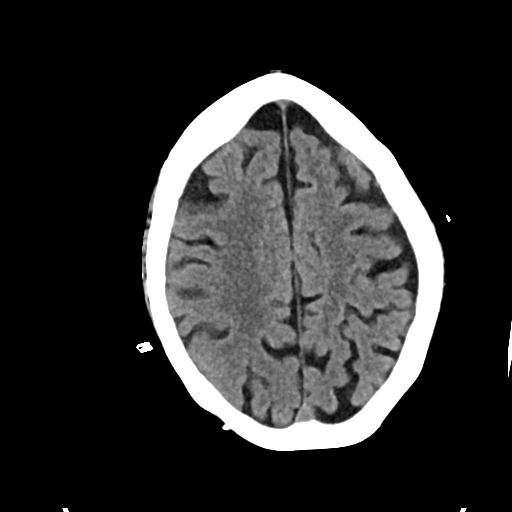
[im 20/32  bone]
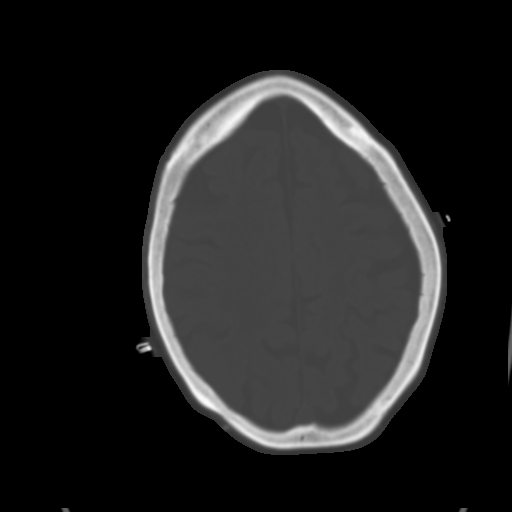
[im 24/32  brain]
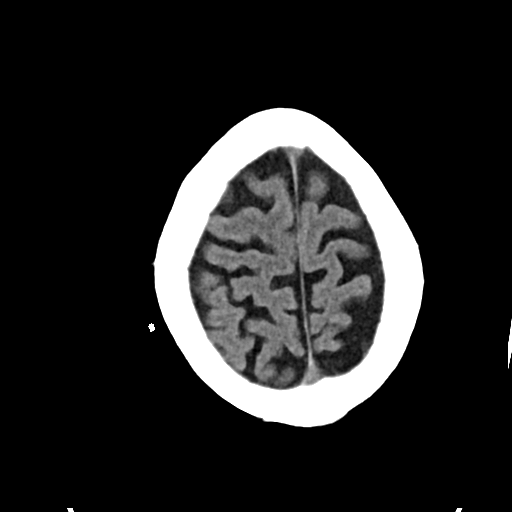
[im 28/32  brain]
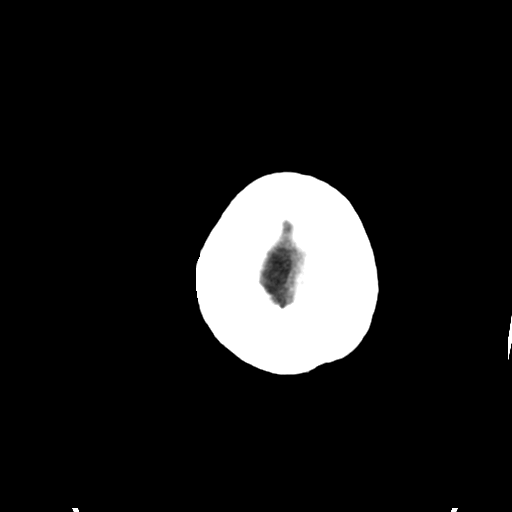

[Series 4: head bone · axial · 0.45mm/px · z∈[-126,-94]mm · 3 of 78 slices shown]
[im 8/78  bone]
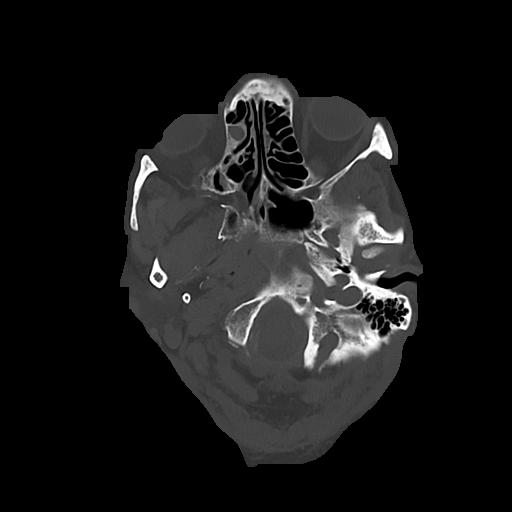
[im 16/78  bone]
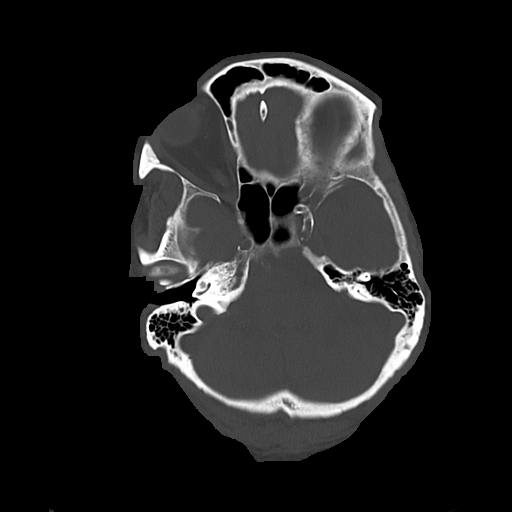
[im 24/78  bone]
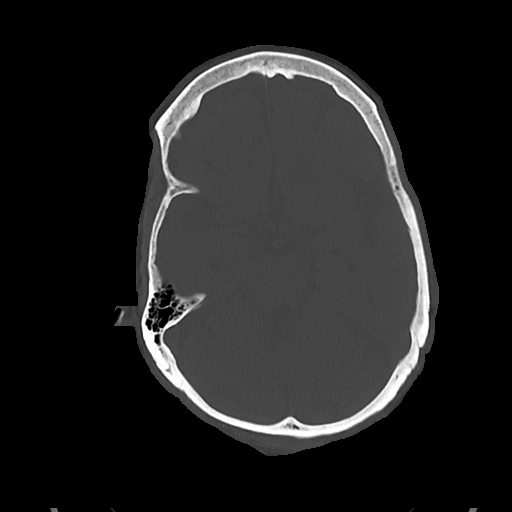

[Series 5: cor soft · coronal · 0.32mm/px · 3 of 67 slices shown]
[im 23/67  brain]
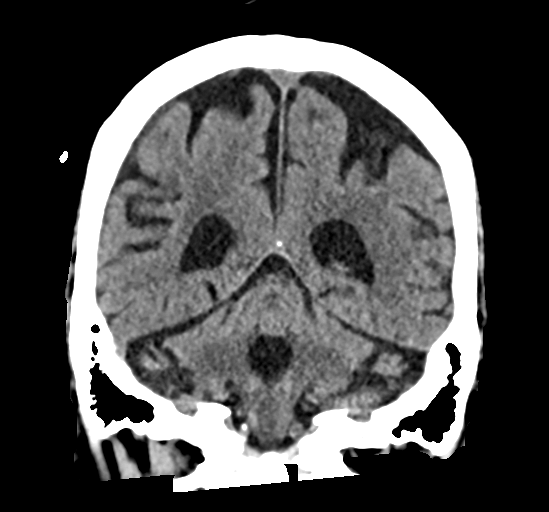
[im 30/67  brain]
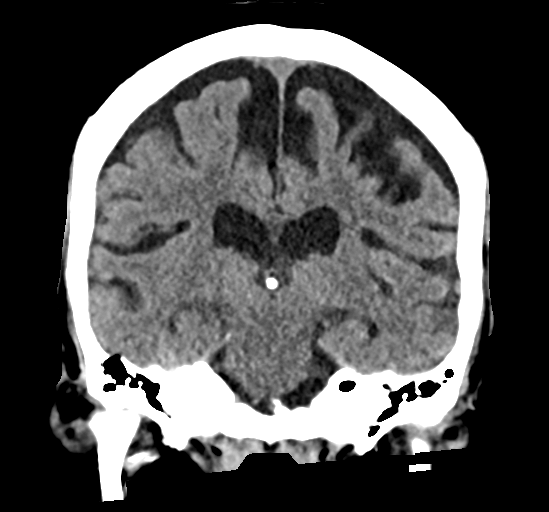
[im 37/67  brain]
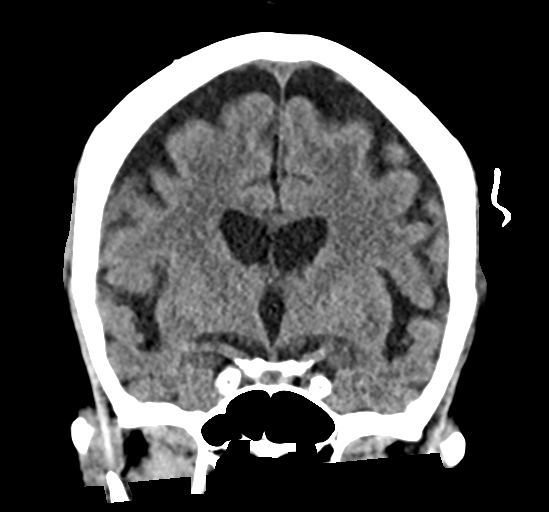

[Series 6: sag soft · sagittal · 0.35mm/px · 3 of 51 slices shown]
[im 17/51  brain]
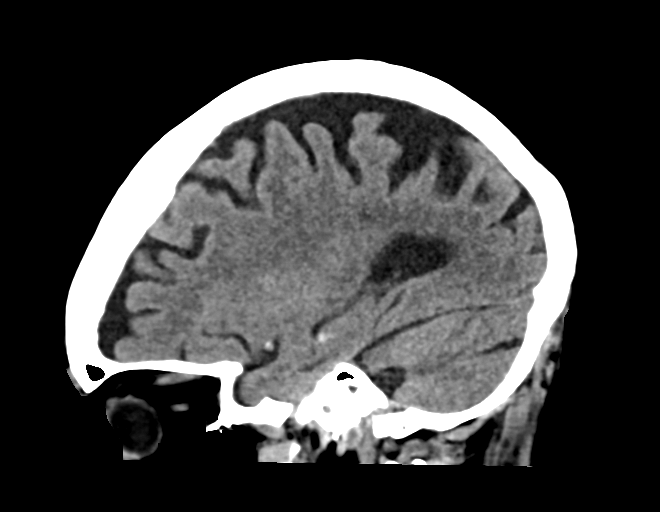
[im 26/51  brain]
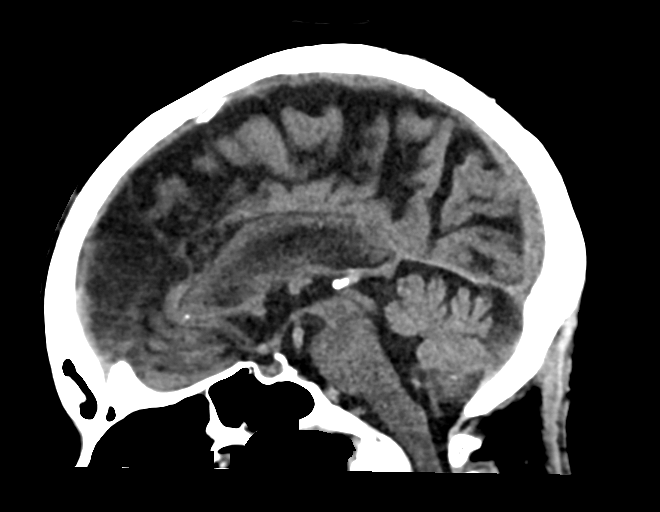
[im 34/51  brain]
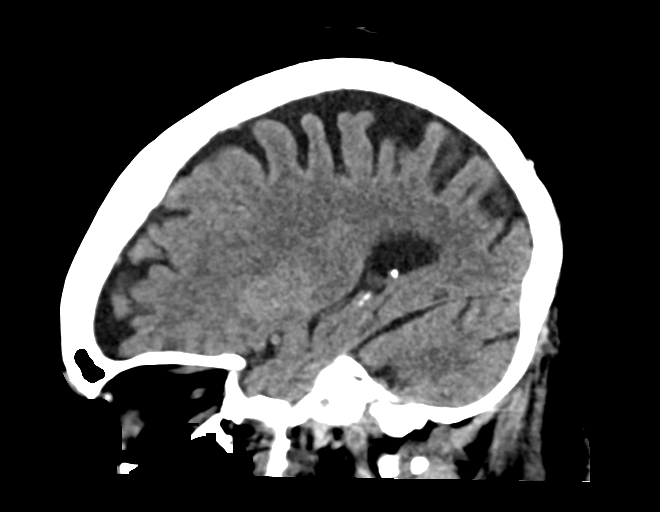

[16 of 47 positions shown; findings below may reference images not displayed]

FINDINGS: Brain: No evidence of acute infarction, hemorrhage, hydrocephalus,
extra-axial collection or mass lesion/mass effect. Scattered
low-density changes within the periventricular and subcortical white
matter compatible with chronic microvascular ischemic change. Mild
diffuse cerebral volume loss.

Vascular: Mild atherosclerotic calcifications involving the large
vessels of the skull base. No unexpected hyperdense vessel.

Skull: Normal. Negative for fracture or focal lesion.

Sinuses/Orbits: Chronic partial opacification involving a few right
ethmoid air cells. Remaining paranasal sinuses and mastoid air cells
are clear. Orbital structures within normal limits.

Other: None.
IMPRESSION: 1.  No acute intracranial findings.

2.  Chronic microvascular ischemic change and cerebral volume loss.

## 2020-04-26 IMAGING — DX DG FOOT COMPLETE 3+V*R*
3 series · 3 of 3 positions shown · non-contrast
Comparison: 10/04/2019

CLINICAL DATA: Pt c/o right foot infection and diabetes wounds for
an unknown period of time. Hx of DM. Patient is scheduled for
surgical removal of infection on the right foot.

EXAM:
RIGHT FOOT COMPLETE - 3+ VIEW

[foot ap]
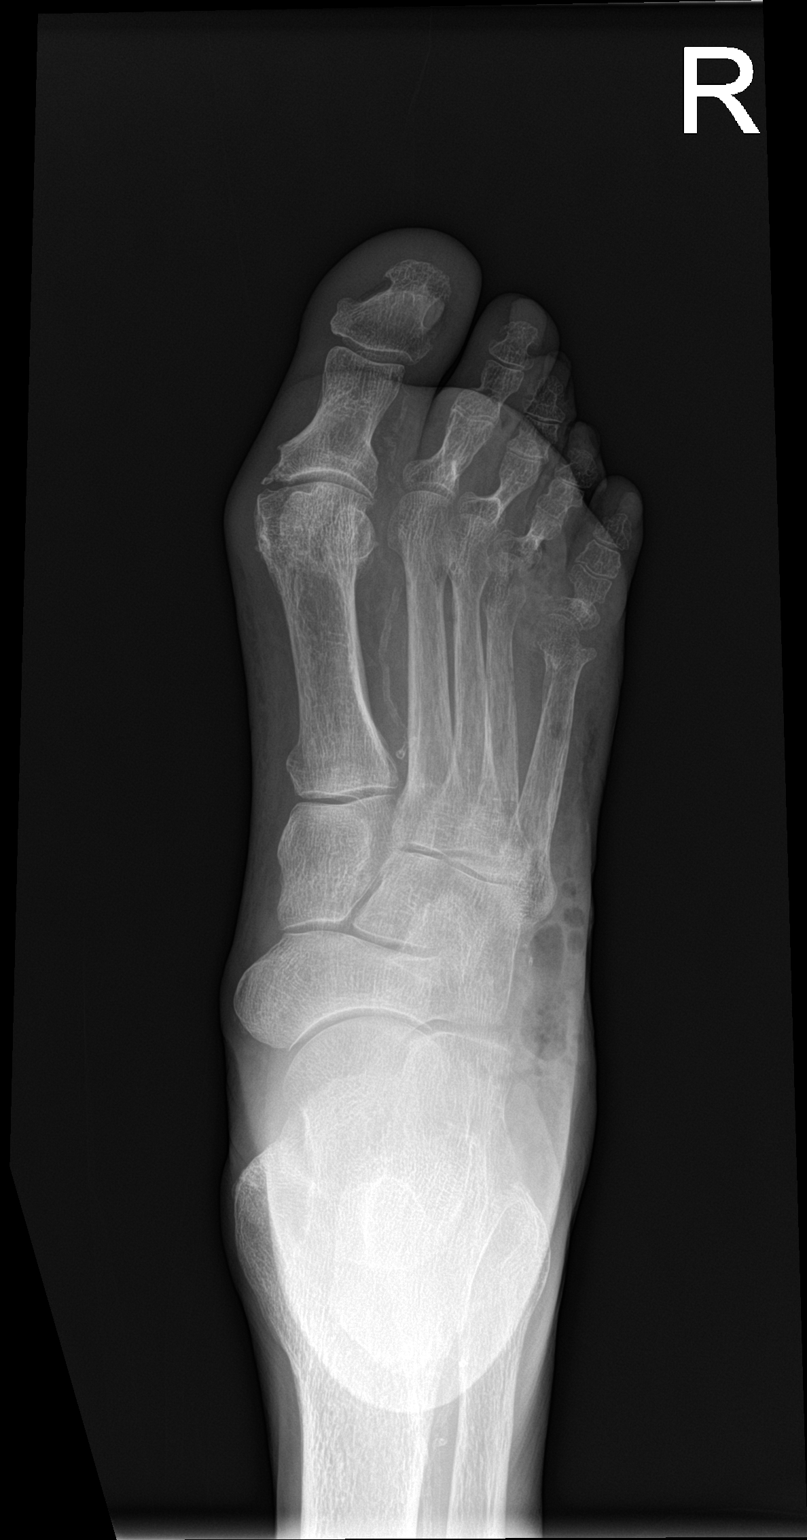

[foot obl]
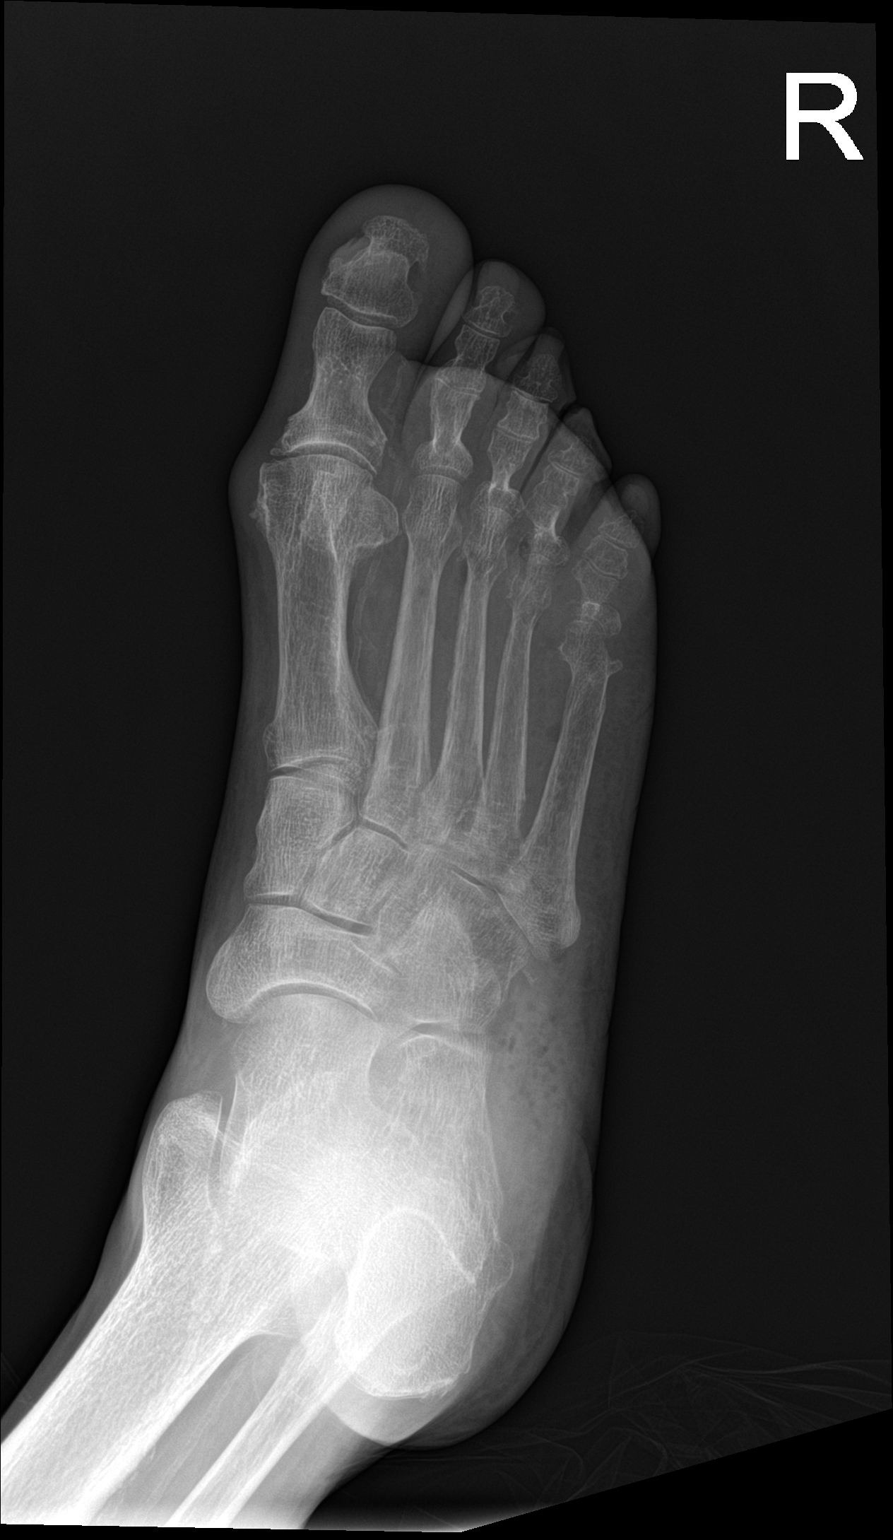

[foot lat]
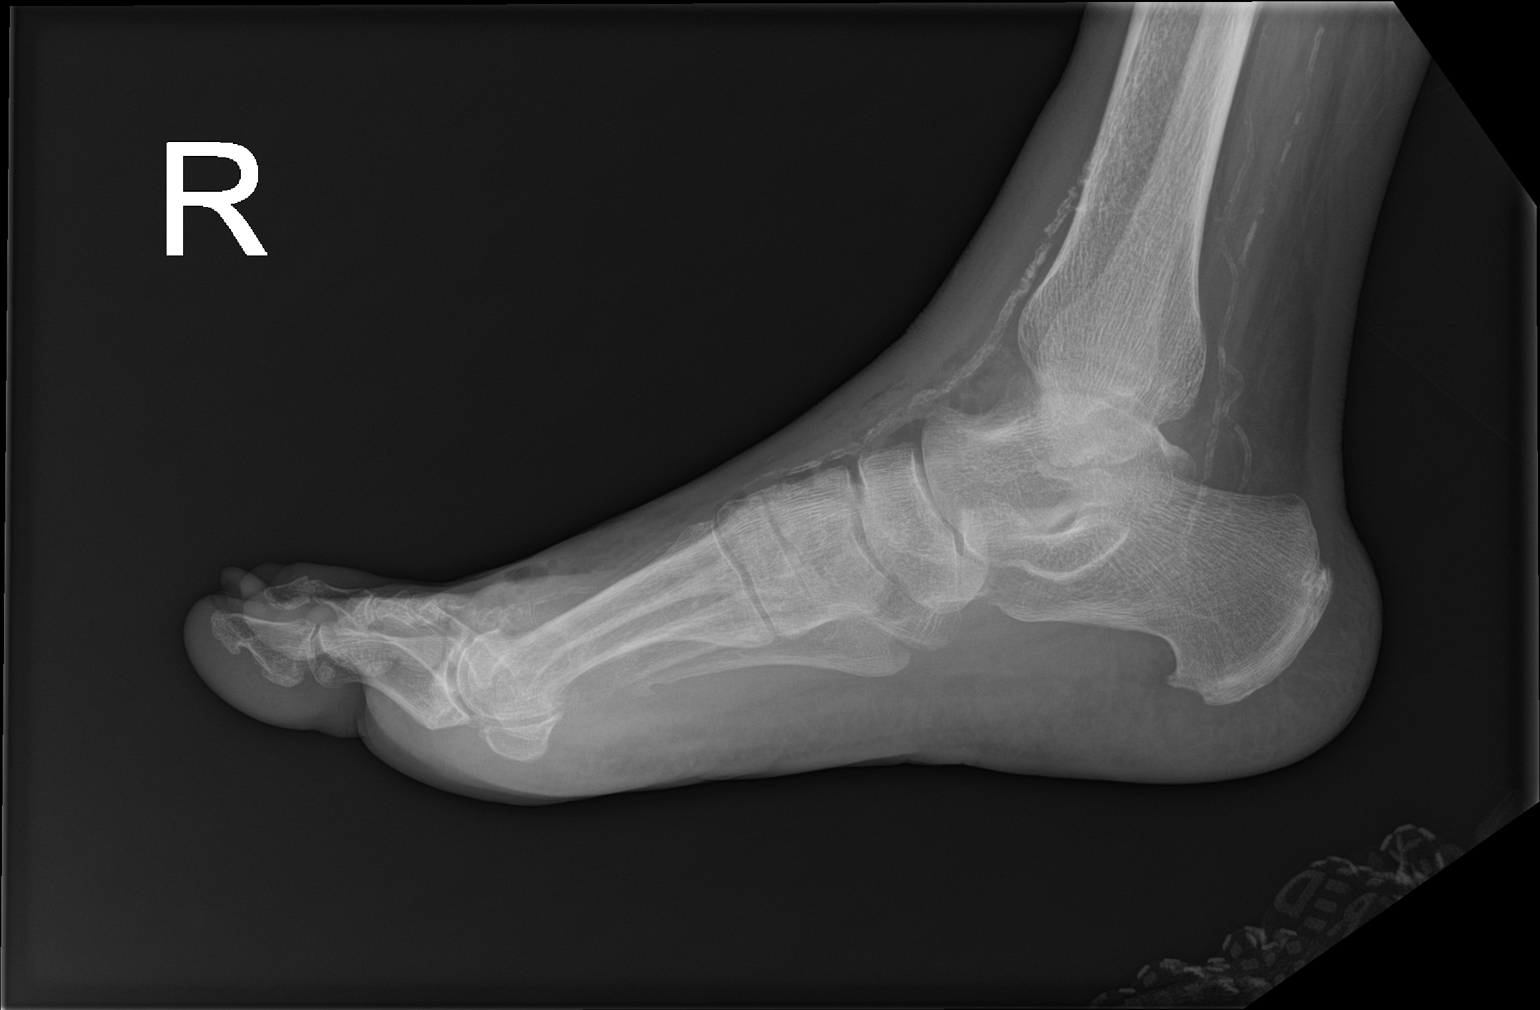

[3 of 3 positions shown; findings below may reference images not displayed]

FINDINGS: Soft tissue swelling with soft tissue air is noted along the dorsal
lateral aspect of the foot, from just distal to the ankle joint to
the lateral metatarsophalangeal joints.

There is a subtle area apparent loss of the white cortical line
along the lateral plantar margin of the cuboid which could reflect
osteomyelitis. There is evidence of resorption along the medial base
of the proximal phalanx of the fifth toe which could reflect
osteomyelitis. No other evidence of osteomyelitis.

No fractures.

Joints are normally aligned. Stable first metatarsophalangeal joint
osteoarthritis. Small plantar and dorsal calcaneal spurs. There are
dense arterial vascular calcifications from the ankle to the foot.
IMPRESSION: 1. Right foot infection with soft tissue swelling and soft tissue
air along the dorsal lateral aspect of the foot as described.
2. Possible small focus of osteomyelitis along the plantar lateral
aspect of the cuboid with another possible area of osteomyelitis
along medial base of the proximal phalanx of the fifth toe. No other
evidence of osteomyelitis.

## 2020-04-29 DIAGNOSIS — D649 Anemia, unspecified: Secondary | ICD-10-CM | POA: Diagnosis not present

## 2020-04-29 DIAGNOSIS — N1832 Chronic kidney disease, stage 3b: Secondary | ICD-10-CM | POA: Diagnosis not present

## 2020-04-29 DIAGNOSIS — I129 Hypertensive chronic kidney disease with stage 1 through stage 4 chronic kidney disease, or unspecified chronic kidney disease: Secondary | ICD-10-CM | POA: Diagnosis not present

## 2020-04-29 DIAGNOSIS — E1122 Type 2 diabetes mellitus with diabetic chronic kidney disease: Secondary | ICD-10-CM | POA: Diagnosis not present

## 2020-04-29 DIAGNOSIS — I739 Peripheral vascular disease, unspecified: Secondary | ICD-10-CM | POA: Diagnosis not present

## 2020-04-30 ENCOUNTER — Encounter: Payer: Self-pay | Admitting: Physical Therapy

## 2020-04-30 ENCOUNTER — Other Ambulatory Visit: Payer: Self-pay

## 2020-04-30 ENCOUNTER — Ambulatory Visit: Payer: Medicare Other | Attending: Vascular Surgery | Admitting: Physical Therapy

## 2020-04-30 DIAGNOSIS — Z9181 History of falling: Secondary | ICD-10-CM | POA: Diagnosis not present

## 2020-04-30 DIAGNOSIS — R2681 Unsteadiness on feet: Secondary | ICD-10-CM | POA: Diagnosis not present

## 2020-04-30 DIAGNOSIS — R293 Abnormal posture: Secondary | ICD-10-CM | POA: Insufficient documentation

## 2020-04-30 DIAGNOSIS — R531 Weakness: Secondary | ICD-10-CM | POA: Diagnosis not present

## 2020-04-30 DIAGNOSIS — R2689 Other abnormalities of gait and mobility: Secondary | ICD-10-CM | POA: Diagnosis not present

## 2020-04-30 NOTE — Therapy (Signed)
[facility] [address] [place], [place], [address] Phone: [phone]   Fax:  [phone]  Physical Therapy Treatment  Patient Details  Name: Monique Cabrera MRN: [id number] Date of Birth: 1935-02-06 Referring Provider (PT): Curt Jews, MD   Encounter Date: 04/30/2020  PT End of Session - 04/30/20 1238    Visit Number  9    Number of Visits  25    Date for PT Re-Evaluation  06/24/20    Authorization Type  UHC Medicare & Medicaid    Progress Note Due on Visit  10    PT Start Time  1232    PT Stop Time  1314    PT Time Calculation (min)  42 min    Equipment Utilized During Treatment  Gait belt    Activity Tolerance  Patient tolerated treatment well;No increased pain    Behavior During Therapy  WFL for tasks assessed/performed       Past Medical History:  Diagnosis Date  . Acute upper respiratory infections of unspecified site   . Amputee 08/2019  . Anemia   . Anemia, unspecified   . Atherosclerosis of native arteries of the extremities, unspecified   . Chest pain, unspecified   . Chronic kidney disease (CKD), stage II (mild)   . Diarrhea   . Disorder of bone and cartilage, unspecified   . Herpes zoster with other nervous system complications(053.19)   . Hypercalcemia   . Hypertension   . Hypertensive renal disease, benign   . Nonspecific reaction to tuberculin skin test without active tuberculosis(795.51)   . Other and unspecified hyperlipidemia   . PAD (peripheral artery disease) ([place])    Per records from [name]  . Pain in joint, lower leg   . Peripheral arterial disease ([place])   . Postherpetic neuralgia   . Proteinuria   . Stroke ([place]) 01/2017  . Type II or unspecified type diabetes mellitus with renal manifestations, uncontrolled(250.42)   . Unspecified disorder of kidney and ureter     Past Surgical History:  Procedure Laterality Date  . ABDOMINAL AORTOGRAM W/LOWER EXTREMITY N/A 10/31/2019    Procedure: ABDOMINAL AORTOGRAM W/LOWER EXTREMITY;  Surgeon: Wellington Hampshire, MD;  Location: [facility];  Service: Cardiovascular;  Laterality: N/A;  . AMPUTATION Right 11/02/2019   Procedure: AMPUTATION BELOW KNEE RIGHT;  Surgeon: Rosetta Posner, MD;  Location: [facility];  Service: Vascular;  Laterality: Right;  . hysterectomy    . INCISION AND DRAINAGE Left 05/27/14   sebacous cyst, ear  . PRP Left    Dr. Ricki Miller  . removal of cyst from hand    . removal of tumor from foot    . TONSILLECTOMY      There were no vitals filed for this visit.  Subjective Assessment - 04/30/20 1235    Subjective  No falls or pain to report. Still having issues at times with getting prosthesis on even with no pads on. Finding she has to stand sometimes to get pin to engage in socket. Has not called [employer].    Limitations  Lifting;Standing;Walking;House hold activities    Patient Stated Goals  To use prosthesis to walk without device, take care of myself, live alone. Go out in community.    Currently in Pain?  No/denies    Pain Score  0-No pain          [facility] Adult PT Treatment/Exercise - 04/30/20 1239      Transfers   Transfers  Sit to Stand;Stand to Sit;Stand Pivot Transfers    Sit to Stand  5: Supervision;With upper extremity assist;From chair/3-in-1    Stand to Sit  5: Supervision;With upper extremity assist;To chair/3-in-1    Stand Pivot Transfers  4: Min assist    Stand Pivot Transfer Details (indicate cue type and reason)  HHA to/from mat table<>wheelchair      Exercises   Exercises  Other Exercises    Other Exercises   hooklying on mat table: bil LE bridge x 10 reps, right prosthesis only bridge for 10 reps, right SLR for 10 reps with assist to control terminal knee extension, then red band resisted right hip fall outs for 10 reps; seated at edge of mat- red band resisted clam shell for 10 reps. cues needed on ex form, to slow down for controll and posture with all ex's.        Prosthetics   Prosthetic Care Comments   placed foam roll with coban over pylon to assist with keeping sock up; worked on pt self donning liner with correct pin alingment to assist with donning at home.     Current prosthetic wear tolerance (days/week)   daily    Current prosthetic wear tolerance (#hours/day)   all awake hours with 1 hour mid-day since last session.     Residual limb condition   intact with no issues, scab has fallen off with wound healed    Education Provided  Proper Donning;Other (comment)    Person(s) Educated  Patient    Education Method  Explanation;Demonstration;Verbal cues    Education Method  Verbalized understanding;Returned demonstration;Verbal cues required;Needs further instruction    Donning Prosthesis  Supervision    Doffing Prosthesis  Supervision          PT Short Term Goals - 04/30/20 1222      PT SHORT TERM GOAL #1   Title  Patient verbalizes adjusting ply socks with limb volume changes with prosthesis. (All STGs Target Date: 05/23/2020)    Time  1    Period  Months    Status  New    Target Date  05/23/20      PT SHORT TERM GOAL #2   Title  Patient tolerates wear of prosthesis >12 hrs total / day without wound increasing.    Time  1    Period  Months    Status  Revised    Target Date  05/23/20      PT SHORT TERM GOAL #3   Title  Berg Balance >36/56    Time  1    Period  Months    Status  New    Target Date  05/23/20      PT SHORT TERM GOAL #4   Title  Patient ambulates 200' with cane & prosthesis with supervision.    Time  1    Period  Months    Status  Revised    Target Date  05/23/20      PT SHORT TERM GOAL #5   Title  Patient negotiates stairs with single rail, ramps & curbs with cane & prosthesis with minA.    Time  1    Period  Months    Status  Revised    Target Date  05/23/20        PT Long Term Goals - 03/26/20 1313      PT LONG TERM GOAL #1   Title  Patient verbalizes & demonstrates understanding of prosthetic care  & diabetic  foot care to enable safe utilization. (All LTGs Target Date: 06/24/2020)    Time  3    Period  Months    Status  New    Target Date  06/24/20      PT LONG TERM GOAL #2   Title  Patient tolerates prosthesis wear >90% of awake hours without skin or residual limb pain to enable function throughout her day.    Time  3    Period  Months    Status  New    Target Date  06/24/20      PT LONG TERM GOAL #3   Title  Berg Balance >/= 45/56 to indicate lower fall risk.    Time  3    Period  Months    Status  New    Target Date  06/24/20      PT LONG TERM GOAL #4   Title  Patient ambulates 500' outdoors including grass with cane or less & prosthesis modified independent to enable community mobility.    Time  3    Period  Months    Status  New    Target Date  06/24/20      PT LONG TERM GOAL #5   Title  Patient negotiates ramps, curbs & stairs with single rail with cane or less & prosthesis modified independent to enable community access.    Time  3    Period  Months    Status  New    Target Date  06/24/20      Additional Long Term Goals   Additional Long Term Goals  Yes      PT LONG TERM GOAL #6   Title  Patient demonstrates skills to enable her to return to gardening including kneeling with UE support like garden kneel bench or chair.    Time  3    Period  Months    Status  New    Target Date  06/24/20            Plan - 04/30/20 1238    Clinical Impression Statement  Today's skilled session continued to focus on proper donning of liner for ease with donning prosthesis at home. Pt able to achieve better placement after instruction and practice in session. Remainder of session continued to focus on hip strengthening for improved stance control with gait/standing. No issues reported with session. The pt is progressing toward goals and should benefit from continued PT to progress toward unmet goals.    Personal Factors and Comorbidities  Age;Comorbidity 3+;Fitness;Time  since onset of injury/illness/exacerbation;Transportation    Comorbidities  Right TTA, HTN, DM, CVA, PAD, HLD, CKD st2,    Examination-Activity Limitations  Locomotion Level;Stairs;Stand;Transfers;Toileting    Examination-Participation Restrictions  Community Activity;Yard Work;Shop    Stability/Clinical Decision Making  Evolving/Moderate complexity    Rehab Potential  Good    PT Frequency  2x / week    PT Duration  12 weeks    PT Treatment/Interventions  ADLs/Self Care Home Management;DME Instruction;Gait training;Stair training;Functional mobility training;Therapeutic activities;Therapeutic exercise;Balance training;Neuromuscular re-education;Patient/family education;Prosthetic Training;Vestibular    PT Next Visit Plan  lateral hip strengthening, continue with gait training with RW/prosthesis with colored bands. Continued with stair negotiation training. Initiate ramps/curbs with RW/prosthesis    Consulted and Agree with Plan of Care  Patient       Patient will benefit from skilled therapeutic intervention in order to improve the following deficits and impairments:  Abnormal gait, Decreased activity tolerance, Decreased balance, Decreased endurance, Decreased  knowledge of use of DME, Decreased mobility, Decreased skin integrity, Decreased strength, Postural dysfunction, Prosthetic Dependency, Pain  Visit Diagnosis: Unsteadiness on feet  Weakness generalized  Other abnormalities of gait and mobility  Abnormal posture     Problem List Patient Active Problem List   Diagnosis Date Noted  . Acute metabolic encephalopathy 67/67/2094  . COVID-19 virus infection 02/08/2020  . Acute kidney injury superimposed on CKD ([place]) 02/08/2020  . Status post below-knee amputation of right lower extremity ([place]) 11/07/2019  . Gangrene of right foot ([place])   . Hyponatremia 10/29/2019  . Chronic diastolic CHF (congestive heart failure) ([place]) 10/29/2019  . Stable proliferative diabetic retinopathy of  both eyes associated with type 2 diabetes mellitus ([place]) 06/05/2018  . Acute CVA (cerebrovascular accident) ([place]) 02/18/2017  . Acute ischemic stroke ([place])   . Internuclear ophthalmoplegia of left eye   . Benign paroxysmal positional vertigo   . Abnormality of gait   . Acute onset of severe vertigo 02/17/2017  . Vertigo 02/17/2017  . Atherosclerosis of native artery of extremity with intermittent claudication ([place]) 03/11/2014  . Diabetic retinopathy ([place]) 03/11/2014  . Retinal hemorrhage due to secondary diabetes ([place]) 03/11/2014  . Type II diabetes mellitus with renal manifestations, uncontrolled ([place]) 03/11/2014  . Chronic hepatitis C without hepatic coma ([place]) 03/11/2014  . Hyperlipidemia 03/11/2014  . Hypoglycemia 04/16/2013  . Disorder of bone and cartilage, unspecified   . Other and unspecified hyperlipidemia   . Essential hypertension, benign   . Atherosclerosis of native artery of extremity ([place])   . Chronic kidney disease (CKD), stage II (mild)   . Peripheral arterial disease ([place])   . Anemia   . Postherpetic neuralgia   . Hypertension     [facility] [place], [place], [place] [address] [place], [place] [address] [phone] 04/30/20, 3:05 PM    Name: LEVA BAINE MRN: [id number] Date of Birth: 1935/03/20

## 2020-05-02 ENCOUNTER — Ambulatory Visit: Payer: Medicare Other | Admitting: Physical Therapy

## 2020-05-02 ENCOUNTER — Other Ambulatory Visit: Payer: Self-pay

## 2020-05-02 ENCOUNTER — Encounter: Payer: Self-pay | Admitting: Physical Therapy

## 2020-05-02 DIAGNOSIS — R2681 Unsteadiness on feet: Secondary | ICD-10-CM | POA: Diagnosis not present

## 2020-05-02 DIAGNOSIS — R2689 Other abnormalities of gait and mobility: Secondary | ICD-10-CM | POA: Diagnosis not present

## 2020-05-02 DIAGNOSIS — R293 Abnormal posture: Secondary | ICD-10-CM

## 2020-05-02 DIAGNOSIS — R531 Weakness: Secondary | ICD-10-CM

## 2020-05-02 DIAGNOSIS — Z9181 History of falling: Secondary | ICD-10-CM | POA: Diagnosis not present

## 2020-05-02 NOTE — Therapy (Signed)
[facility] [address] [place], [place], [address] Phone: [phone]   Fax:  [phone]  Physical Therapy Treatment  Patient Details  Name: Monique Cabrera MRN: [id number] Date of Birth: 1935/10/08 Referring Provider (PT): Curt Jews, MD   Encounter Date: 05/02/2020  PT End of Session - 05/02/20 1322    Visit Number  10    Number of Visits  25    Date for PT Re-Evaluation  06/24/20    Authorization Type  UHC Medicare & Medicaid    Progress Note Due on Visit  20    PT Start Time  1318    PT Stop Time  1358    PT Time Calculation (min)  40 min    Equipment Utilized During Treatment  Gait belt    Activity Tolerance  Patient tolerated treatment well;No increased pain    Behavior During Therapy  WFL for tasks assessed/performed       Past Medical History:  Diagnosis Date  . Acute upper respiratory infections of unspecified site   . Amputee 08/2019  . Anemia   . Anemia, unspecified   . Atherosclerosis of native arteries of the extremities, unspecified   . Chest pain, unspecified   . Chronic kidney disease (CKD), stage II (mild)   . Diarrhea   . Disorder of bone and cartilage, unspecified   . Herpes zoster with other nervous system complications(053.19)   . Hypercalcemia   . Hypertension   . Hypertensive renal disease, benign   . Nonspecific reaction to tuberculin skin test without active tuberculosis(795.51)   . Other and unspecified hyperlipidemia   . PAD (peripheral artery disease) ([place])    Per records from [facility]  . Pain in joint, lower leg   . Peripheral arterial disease ([place])   . Postherpetic neuralgia   . Proteinuria   . Stroke ([place]) 01/2017  . Type II or unspecified type diabetes mellitus with renal manifestations, uncontrolled(250.42)   . Unspecified disorder of kidney and ureter     Past Surgical History:  Procedure Laterality Date  . ABDOMINAL AORTOGRAM W/LOWER EXTREMITY N/A 10/31/2019    Procedure: ABDOMINAL AORTOGRAM W/LOWER EXTREMITY;  Surgeon: Wellington Hampshire, MD;  Location: [facility];  Service: Cardiovascular;  Laterality: N/A;  . AMPUTATION Right 11/02/2019   Procedure: AMPUTATION BELOW KNEE RIGHT;  Surgeon: Rosetta Posner, MD;  Location: [facility];  Service: Vascular;  Laterality: Right;  . hysterectomy    . INCISION AND DRAINAGE Left 05/27/14   sebacous cyst, ear  . PRP Left    Dr. Ricki Miller  . removal of cyst from hand    . removal of tumor from foot    . TONSILLECTOMY      There were no vitals filed for this visit.  Subjective Assessment - 05/02/20 1321    Subjective  No new complaints. No falls or pain to report. Did not have any issues with donning prosthesis today, however did have issues yesterday. See [employer] on the 11th of next week for follow up (has been scheduled for awhile).    Limitations  Lifting;Standing;Walking;House hold activities    Patient Stated Goals  To use prosthesis to walk without device, take care of myself, live alone. Go out in community.    Currently in Pain?  No/denies    Pain Score  0-No pain           [facility] Adult PT Treatment/Exercise - 05/02/20 1323      Transfers  Transfers  Sit to Stand;Stand to Sit    Sit to Stand  5: Supervision;With upper extremity assist;From chair/3-in-1    Stand to Sit  5: Supervision;With upper extremity assist;To chair/3-in-1      Ambulation/Gait   Ambulation/Gait  Yes    Ambulation/Gait Assistance  4: Min guard    Ambulation/Gait Assistance Details  cues on posture, to increase base of support and on walker position with gait.     Ambulation Distance (Feet)  115 Feet   x1, 80 x2   Assistive device  Rolling walker;Prosthesis    Gait Pattern  Step-to pattern;Step-through pattern;Decreased stride length;Decreased stance time - right;Decreased step length - left;Narrow base of support;Trunk flexed    Ambulation Surface  Level;Indoor    Stairs  Yes    Stairs Assistance  4: Min guard     Stairs Assistance Details (indicate cue type and reason)  reminder cues to shift over prosthesis with descent and for sequencing with prosthesis.     Stair Management Technique  Two rails;Step to pattern;Forwards    Number of Stairs  4   x4 reps   Ramp  4: Min assist    Ramp Details (indicate cue type and reason)  PTA demo'd technique prior to pt performance. with RW/prosthesis x2 reps with cues on posture, sequencing and weight shifting. less assistance needed with second rep.     Curb  4: Min assist    Curb Details (indicate cue type and reason)  RW/prosthesis- PTA demo'd prior to pt performance. cues needed on stance position and sequencing with pt performance.       Prosthetics   Prosthetic Care Comments   discussed the 3 levels of advancing her mobility with walking: level one around the home as needed (which pt is doing at this time), level 2 short community distances with RW/wheelchair in familiar enviroments (to/from car at her house, daughters house, into/out of [place] doctors offices, level 3 full communtiy distances with prosthesis. Pt is to begin working on level 2 with family.     Current prosthetic wear tolerance (days/week)   daily    Current prosthetic wear tolerance (#hours/day)   all awake hours with 1 hour mid-day since last session.     Residual limb condition   intact with no issues, scab has fallen off with wound healed    Education Provided  Residual limb care;Proper wear schedule/adjustment;Other (comment)   see care comment above   Person(s) Educated  Patient    Education Method  Explanation;Demonstration;Verbal cues    Education Method  Verbalized understanding;Returned demonstration;Verbal cues required;Needs further instruction    Donning Prosthesis  Supervision    Doffing Prosthesis  Supervision          PT Short Term Goals - 04/30/20 1222      PT SHORT TERM GOAL #1   Title  Patient verbalizes adjusting ply socks with limb volume changes with prosthesis.  (All STGs Target Date: 05/23/2020)    Time  1    Period  Months    Status  New    Target Date  05/23/20      PT SHORT TERM GOAL #2   Title  Patient tolerates wear of prosthesis >12 hrs total / day without wound increasing.    Time  1    Period  Months    Status  Revised    Target Date  05/23/20      PT SHORT TERM GOAL #3   Title  Merrilee Jansky Balance >36/56  Time  1    Period  Months    Status  New    Target Date  05/23/20      PT SHORT TERM GOAL #4   Title  Patient ambulates 200' with cane & prosthesis with supervision.    Time  1    Period  Months    Status  Revised    Target Date  05/23/20      PT SHORT TERM GOAL #5   Title  Patient negotiates stairs with single rail, ramps & curbs with cane & prosthesis with minA.    Time  1    Period  Months    Status  Revised    Target Date  05/23/20        PT Long Term Goals - 03/26/20 1313      PT LONG TERM GOAL #1   Title  Patient verbalizes & demonstrates understanding of prosthetic care & diabetic foot care to enable safe utilization. (All LTGs Target Date: 06/24/2020)    Time  3    Period  Months    Status  New    Target Date  06/24/20      PT LONG TERM GOAL #2   Title  Patient tolerates prosthesis wear >90% of awake hours without skin or residual limb pain to enable function throughout her day.    Time  3    Period  Months    Status  New    Target Date  06/24/20      PT LONG TERM GOAL #3   Title  Berg Balance >/= 45/56 to indicate lower fall risk.    Time  3    Period  Months    Status  New    Target Date  06/24/20      PT LONG TERM GOAL #4   Title  Patient ambulates 500' outdoors including grass with cane or less & prosthesis modified independent to enable community mobility.    Time  3    Period  Months    Status  New    Target Date  06/24/20      PT LONG TERM GOAL #5   Title  Patient negotiates ramps, curbs & stairs with single rail with cane or less & prosthesis modified independent to enable community  access.    Time  3    Period  Months    Status  New    Target Date  06/24/20      Additional Long Term Goals   Additional Long Term Goals  Yes      PT LONG TERM GOAL #6   Title  Patient demonstrates skills to enable her to return to gardening including kneeling with UE support like garden kneel bench or chair.    Time  3    Period  Months    Status  New    Target Date  06/24/20            Plan - 05/02/20 1323    Clinical Impression Statement  Today's skilled session continued to focus on gait with prosthesis and RW with continued notation of pylon lean. Pt see's prosthetist next Wed and will need an alignment check/adjustment. Also continued with stair training today with bil rails with less cues needed. Initiated ramp/curb today with prosthesis/RW with up to min assist needed with cues. The pt is making steady progress toward goals and should benefit from continued PT to progress toward unmet goals.    Personal  Factors and Comorbidities  Age;Comorbidity 3+;Fitness;Time since onset of injury/illness/exacerbation;Transportation    Comorbidities  Right TTA, HTN, DM, CVA, PAD, HLD, CKD st2,    Examination-Activity Limitations  Locomotion Level;Stairs;Stand;Transfers;Toileting    Examination-Participation Restrictions  Community Activity;Yard Work;Shop    Stability/Clinical Decision Making  Evolving/Moderate complexity    Rehab Potential  Good    PT Frequency  2x / week    PT Duration  12 weeks    PT Treatment/Interventions  ADLs/Self Care Home Management;DME Instruction;Gait training;Stair training;Functional mobility training;Therapeutic activities;Therapeutic exercise;Balance training;Neuromuscular re-education;Patient/family education;Prosthetic Training;Vestibular    PT Next Visit Plan  Raquel Sarna- watch her gait and see if any changes are needed besides the pylon lean. Then either write them down for [facility] or call [employer]-she see's him Tuesday. continued with RW/prosthesis on barriers.  Begin cane instruction with laps around table.    Consulted and Agree with Plan of Care  Patient       Patient will benefit from skilled therapeutic intervention in order to improve the following deficits and impairments:  Abnormal gait, Decreased activity tolerance, Decreased balance, Decreased endurance, Decreased knowledge of use of DME, Decreased mobility, Decreased skin integrity, Decreased strength, Postural dysfunction, Prosthetic Dependency, Pain  Visit Diagnosis: Unsteadiness on feet  Weakness generalized  Other abnormalities of gait and mobility  Abnormal posture     Problem List Patient Active Problem List   Diagnosis Date Noted  . Acute metabolic encephalopathy 81/19/1478  . COVID-19 virus infection 02/08/2020  . Acute kidney injury superimposed on CKD ([place]) 02/08/2020  . Status post below-knee amputation of right lower extremity ([place]) 11/07/2019  . Gangrene of right foot ([place])   . Hyponatremia 10/29/2019  . Chronic diastolic CHF (congestive heart failure) ([place]) 10/29/2019  . Stable proliferative diabetic retinopathy of both eyes associated with type 2 diabetes mellitus ([place]) 06/05/2018  . Acute CVA (cerebrovascular accident) ([place]) 02/18/2017  . Acute ischemic stroke ([place])   . Internuclear ophthalmoplegia of left eye   . Benign paroxysmal positional vertigo   . Abnormality of gait   . Acute onset of severe vertigo 02/17/2017  . Vertigo 02/17/2017  . Atherosclerosis of native artery of extremity with intermittent claudication ([place]) 03/11/2014  . Diabetic retinopathy ([place]) 03/11/2014  . Retinal hemorrhage due to secondary diabetes ([place]) 03/11/2014  . Type II diabetes mellitus with renal manifestations, uncontrolled ([place]) 03/11/2014  . Chronic hepatitis C without hepatic coma ([place]) 03/11/2014  . Hyperlipidemia 03/11/2014  . Hypoglycemia 04/16/2013  . Disorder of bone and cartilage, unspecified   . Other and unspecified hyperlipidemia   . Essential hypertension,  benign   . Atherosclerosis of native artery of extremity ([place])   . Chronic kidney disease (CKD), stage II (mild)   . Peripheral arterial disease ([place])   . Anemia   . Postherpetic neuralgia   . Hypertension     [place], [place], [place] [address], [place], [place] [address] [phone] 05/02/20, 4:43 PM   Name: ANGILA WOMBLES MRN: [id number] Date of Birth: 11/29/1935

## 2020-05-03 DIAGNOSIS — I96 Gangrene, not elsewhere classified: Secondary | ICD-10-CM | POA: Diagnosis not present

## 2020-05-03 DIAGNOSIS — I639 Cerebral infarction, unspecified: Secondary | ICD-10-CM | POA: Diagnosis not present

## 2020-05-05 ENCOUNTER — Ambulatory Visit: Payer: Medicare Other | Admitting: Rehabilitation

## 2020-05-05 ENCOUNTER — Other Ambulatory Visit: Payer: Self-pay

## 2020-05-05 DIAGNOSIS — R293 Abnormal posture: Secondary | ICD-10-CM

## 2020-05-05 DIAGNOSIS — Z9181 History of falling: Secondary | ICD-10-CM

## 2020-05-05 DIAGNOSIS — R531 Weakness: Secondary | ICD-10-CM

## 2020-05-05 DIAGNOSIS — R2689 Other abnormalities of gait and mobility: Secondary | ICD-10-CM

## 2020-05-05 DIAGNOSIS — R2681 Unsteadiness on feet: Secondary | ICD-10-CM | POA: Diagnosis not present

## 2020-05-05 NOTE — Patient Instructions (Addendum)
Call [facility] ASAP to make appt to discuss L toe and left hand skin issues.    Weigh yourself daily with leg on (at same time every day) for 3-4 days to see if there is a fluctuation.    For R leg, note that she seems to have fluctuating fluid volumes as this may be contributing to how leg is fitting.  Leg is harder to get on the morning.  But she is able to tell when leg is on correctly by the feel.    Note R medial pylon lean with standing and gait.  Some of this is hip weakness. I am also seeing her have a hard time with knee control, is this maybe foot position? She tends to want to hyperextend knee in stance or keep it flexed to prevent the hyperextension.

## 2020-05-05 NOTE — Therapy (Signed)
[facility] [address] [place], [place], [address] Phone: [phone]   Fax:  [phone]  Physical Therapy Treatment  Patient Details  Name: MARIKO NOWAKOWSKI MRN: [id number] Date of Birth: Nov 12, 1935 Referring Provider (PT): Curt Jews, MD   Encounter Date: 05/05/2020  PT End of Session - 05/05/20 1505    Visit Number  11    Number of Visits  25    Date for PT Re-Evaluation  06/24/20    Authorization Type  [employer] Medicare & Medicaid    Progress Note Due on Visit  20    PT Start Time  1316    PT Stop Time  1400    PT Time Calculation (min)  44 min    Equipment Utilized During Treatment  Gait belt    Activity Tolerance  Patient tolerated treatment well;No increased pain    Behavior During Therapy  WFL for tasks assessed/performed       Past Medical History:  Diagnosis Date  . Acute upper respiratory infections of unspecified site   . Amputee 08/2019  . Anemia   . Anemia, unspecified   . Atherosclerosis of native arteries of the extremities, unspecified   . Chest pain, unspecified   . Chronic kidney disease (CKD), stage II (mild)   . Diarrhea   . Disorder of bone and cartilage, unspecified   . Herpes zoster with other nervous system complications(053.19)   . Hypercalcemia   . Hypertension   . Hypertensive renal disease, benign   . Nonspecific reaction to tuberculin skin test without active tuberculosis(795.51)   . Other and unspecified hyperlipidemia   . PAD (peripheral artery disease) ([place])    Per records from [facility]  . Pain in joint, lower leg   . Peripheral arterial disease ([place])   . Postherpetic neuralgia   . Proteinuria   . Stroke ([place]) 01/2017  . Type II or unspecified type diabetes mellitus with renal manifestations, uncontrolled(250.42)   . Unspecified disorder of kidney and ureter     Past Surgical History:  Procedure Laterality Date  . ABDOMINAL AORTOGRAM W/LOWER EXTREMITY N/A  10/31/2019   Procedure: ABDOMINAL AORTOGRAM W/LOWER EXTREMITY;  Surgeon: Wellington Hampshire, MD;  Location: [facility];  Service: Cardiovascular;  Laterality: N/A;  . AMPUTATION Right 11/02/2019   Procedure: AMPUTATION BELOW KNEE RIGHT;  Surgeon: Rosetta Posner, MD;  Location: [facility];  Service: Vascular;  Laterality: Right;  . hysterectomy    . INCISION AND DRAINAGE Left 05/27/14   sebacous cyst, ear  . PRP Left    Dr. Ricki Miller  . removal of cyst from hand    . removal of tumor from foot    . TONSILLECTOMY      There were no vitals filed for this visit.                    [facility] Adult PT Treatment/Exercise - 05/05/20 1330      Transfers   Transfers  Sit to Stand;Stand to Sit    Sit to Stand  6: Modified independent (Device/Increase time)    Stand to Sit  5: Supervision    Stand to Sit Details (indicate cue type and reason)  Verbal cues for sequencing;Verbal cues for technique;Verbal cues for safe use of DME/AE    Stand to Sit Details  cues to keep RW with her until all the way around to chair      Ambulation/Gait   Ambulation/Gait  Yes  Ambulation/Gait Assistance  5: Supervision;4: Min guard    Ambulation/Gait Assistance Details  Cues for wider BOS with gait as she tends to adduct RLE, cues for increased R knee extension in stance along with increased R hip extension/abd activation during stance.  It looks as though R knee goes into foreceful hyperextension unless PT able to slow weight shift and assist with knee extension.  I would like for [employer] to assess foot to see if its set in too much PF.  Also provided facilitation for improved R lateral weight shift.      Ambulation Distance (Feet)  200 Feet   100' x 1   Assistive device  Rolling walker;Prosthesis    Gait Pattern  Step-to pattern;Step-through pattern;Decreased stride length;Decreased stance time - right;Decreased step length - left;Narrow base of support;Trunk flexed    Ambulation Surface  Level;Indoor     Gait Comments  In // bars worked on improved R hip activation with forward/backward stepping with LLE x 20 reps with BUE>single UE support.  Progressed to stepping LLE to 4" step x 20 reps BUE support fading to single UE support.  Note good knee extension during these tasks.  Then had pt ambulate in // bars keeping feet aligned with tape on floor (approx shoulder width apart) to maintain wider BOS.  Performed x 2 reps then with use of mirror to avoid looking down.  Carried this over to gait withRW around gym with improvement noted with R lateral weight shift and BOS, however still has difficulty with knee extension during R stance phase of gait.        Prosthetics   Prosthetic Care Comments   Discussed care of L toe and watching closely for changes.  Recommend she call and schedule appt with vascular MD quickly to assess this and L hand.  Discussed taking several shoes to [employer]	[place] (as similar as possible) so that they may make correct adjustments as she does not like the tennis shoes she was fitted for the last time.  Discussed weighing daily for the next several days (same time of day) to know true fluid fluctuations.      Current prosthetic wear tolerance (days/week)   daily    Current prosthetic wear tolerance (#hours/day)   all awake hours with 1 hour mid-day since last session.     Current prosthetic weight-bearing tolerance (hours/day)   Pt with some pain at lateral aspect of knee but this seemed to improve with improved weight shift over RLE.      Edema  none    Residual limb condition   intact with no issues, scab has fallen off with wound healed    Education Provided  Skin check;Residual limb care;Care of non-amputated limb;Proper Donning;Correct ply sock adjustment;Proper Doffing;Proper wear schedule/adjustment    Person(s) Educated  Patient    Education Method  Explanation;Verbal cues;Demonstration    Education Method  Verbalized understanding;Needs further instruction    Donning  Prosthesis  Supervision    Doffing Prosthesis  Supervision             PT Education - 05/05/20 1502    Education Details  Discussed calling vascular MD ASAP regarding L toe, education to keep an eye on toe for any increase in redness or the start of any drainage.  Also regarding L hand.  Discussed daily weight checks (note history of CHF however she was unaware) due to what seems like increased fluid.    Person(s) Educated  Patient    Methods  Explanation    Comprehension  Verbalized understanding       PT Short Term Goals - 04/30/20 1222      PT SHORT TERM GOAL #1   Title  Patient verbalizes adjusting ply socks with limb volume changes with prosthesis. (All STGs Target Date: 05/23/2020)    Time  1    Period  Months    Status  New    Target Date  05/23/20      PT SHORT TERM GOAL #2   Title  Patient tolerates wear of prosthesis >12 hrs total / day without wound increasing.    Time  1    Period  Months    Status  Revised    Target Date  05/23/20      PT SHORT TERM GOAL #3   Title  Berg Balance >36/56    Time  1    Period  Months    Status  New    Target Date  05/23/20      PT SHORT TERM GOAL #4   Title  Patient ambulates 200' with cane & prosthesis with supervision.    Time  1    Period  Months    Status  Revised    Target Date  05/23/20      PT SHORT TERM GOAL #5   Title  Patient negotiates stairs with single rail, ramps & curbs with cane & prosthesis with minA.    Time  1    Period  Months    Status  Revised    Target Date  05/23/20        PT Long Term Goals - 03/26/20 1313      PT LONG TERM GOAL #1   Title  Patient verbalizes & demonstrates understanding of prosthetic care & diabetic foot care to enable safe utilization. (All LTGs Target Date: 06/24/2020)    Time  3    Period  Months    Status  New    Target Date  06/24/20      PT LONG TERM GOAL #2   Title  Patient tolerates prosthesis wear >90% of awake hours without skin or residual limb pain to  enable function throughout her day.    Time  3    Period  Months    Status  New    Target Date  06/24/20      PT LONG TERM GOAL #3   Title  Berg Balance >/= 45/56 to indicate lower fall risk.    Time  3    Period  Months    Status  New    Target Date  06/24/20      PT LONG TERM GOAL #4   Title  Patient ambulates 500' outdoors including grass with cane or less & prosthesis modified independent to enable community mobility.    Time  3    Period  Months    Status  New    Target Date  06/24/20      PT LONG TERM GOAL #5   Title  Patient negotiates ramps, curbs & stairs with single rail with cane or less & prosthesis modified independent to enable community access.    Time  3    Period  Months    Status  New    Target Date  06/24/20      Additional Long Term Goals   Additional Long Term Goals  Yes      PT LONG TERM GOAL #6  Title  Patient demonstrates skills to enable her to return to gardening including kneeling with UE support like garden kneel bench or chair.    Time  3    Period  Months    Status  New    Target Date  06/24/20            Plan - 05/05/20 1505    Clinical Impression Statement  Skilled session focused on improving gait quality with improved L hip activation in stance, wider BOS with gait, and standing hip strengthening tasks.  Pt continues to demonstrate medial pylon lean but also question whether foot is too plantar flexed due to hard hyperextension in stance (or she maintains in flexed position to avoid this).  Provided pt with note to send to [employer] tomorrow regarding issues.    Personal Factors and Comorbidities  Age;Comorbidity 3+;Fitness;Time since onset of injury/illness/exacerbation;Transportation    Comorbidities  Right TTA, HTN, DM, CVA, PAD, HLD, CKD st2,    Examination-Activity Limitations  Locomotion Level;Stairs;Stand;Transfers;Toileting    Examination-Participation Restrictions  Community Activity;Yard Work;Shop    Stability/Clinical  Decision Making  Evolving/Moderate complexity    Rehab Potential  Good    PT Frequency  2x / week    PT Duration  12 weeks    PT Treatment/Interventions  ADLs/Self Care Home Management;DME Instruction;Gait training;Stair training;Functional mobility training;Therapeutic activities;Therapeutic exercise;Balance training;Neuromuscular re-education;Patient/family education;Prosthetic Training;Vestibular    PT Next Visit Plan  She should have had adjustments.  Keep a check on her left toe and see if she got in to vascular MD (i'm a little worried about it). continued with RW/prosthesis on barriers. Begin cane instruction with laps around table.    Consulted and Agree with Plan of Care  Patient       Patient will benefit from skilled therapeutic intervention in order to improve the following deficits and impairments:  Abnormal gait, Decreased activity tolerance, Decreased balance, Decreased endurance, Decreased knowledge of use of DME, Decreased mobility, Decreased skin integrity, Decreased strength, Postural dysfunction, Prosthetic Dependency, Pain  Visit Diagnosis: Unsteadiness on feet  Weakness generalized  Other abnormalities of gait and mobility  Abnormal posture  History of fall     Problem List Patient Active Problem List   Diagnosis Date Noted  . Acute metabolic encephalopathy 85/88/5027  . COVID-19 virus infection 02/08/2020  . Acute kidney injury superimposed on CKD ([place]) 02/08/2020  . Status post below-knee amputation of right lower extremity ([place]) 11/07/2019  . Gangrene of right foot ([place])   . Hyponatremia 10/29/2019  . Chronic diastolic CHF (congestive heart failure) ([place]) 10/29/2019  . Stable proliferative diabetic retinopathy of both eyes associated with type 2 diabetes mellitus ([place]) 06/05/2018  . Acute CVA (cerebrovascular accident) ([place]) 02/18/2017  . Acute ischemic stroke ([place])   . Internuclear ophthalmoplegia of left eye   . Benign paroxysmal positional vertigo    . Abnormality of gait   . Acute onset of severe vertigo 02/17/2017  . Vertigo 02/17/2017  . Atherosclerosis of native artery of extremity with intermittent claudication ([place]) 03/11/2014  . Diabetic retinopathy ([place]) 03/11/2014  . Retinal hemorrhage due to secondary diabetes ([place]) 03/11/2014  . Type II diabetes mellitus with renal manifestations, uncontrolled ([place]) 03/11/2014  . Chronic hepatitis C without hepatic coma ([place]) 03/11/2014  . Hyperlipidemia 03/11/2014  . Hypoglycemia 04/16/2013  . Disorder of bone and cartilage, unspecified   . Other and unspecified hyperlipidemia   . Essential hypertension, benign   . Atherosclerosis of native artery of extremity ([place])   .  Chronic kidney disease (CKD), stage II (mild)   . Peripheral arterial disease ([place])   . Anemia   . Postherpetic neuralgia   . Hypertension     Cameron Sprang, PT, MPT [facility] [address] [place], [place], [address] Phone: [phone]   Fax:  [phone] 05/05/20, 3:26 PM  Name: NOYA SANTARELLI MRN: [id number] Date of Birth: 04-Mar-1935

## 2020-05-07 ENCOUNTER — Ambulatory Visit: Payer: Medicare Other | Admitting: Physical Therapy

## 2020-05-07 ENCOUNTER — Other Ambulatory Visit: Payer: Self-pay

## 2020-05-07 ENCOUNTER — Encounter: Payer: Self-pay | Admitting: Physical Therapy

## 2020-05-07 DIAGNOSIS — R2689 Other abnormalities of gait and mobility: Secondary | ICD-10-CM

## 2020-05-07 DIAGNOSIS — R293 Abnormal posture: Secondary | ICD-10-CM

## 2020-05-07 DIAGNOSIS — Z9181 History of falling: Secondary | ICD-10-CM

## 2020-05-07 DIAGNOSIS — R2681 Unsteadiness on feet: Secondary | ICD-10-CM | POA: Diagnosis not present

## 2020-05-07 DIAGNOSIS — R531 Weakness: Secondary | ICD-10-CM | POA: Diagnosis not present

## 2020-05-07 NOTE — Therapy (Signed)
[facility] [address] [place], [place], [address] Phone: [phone]   Fax:  [phone]  Physical Therapy Treatment  Patient Details  Name: Monique Cabrera MRN: [id number] Date of Birth: 04/07/1935 Referring Provider (PT): Curt Jews, MD   Encounter Date: 05/07/2020  PT End of Session - 05/07/20 1428    Visit Number  12    Number of Visits  25    Date for PT Re-Evaluation  06/24/20    Authorization Type  [employer] Medicare & Medicaid    Progress Note Due on Visit  20    PT Start Time  1236    PT Stop Time  1318    PT Time Calculation (min)  42 min    Equipment Utilized During Treatment  Gait belt    Activity Tolerance  Patient tolerated treatment well;No increased pain    Behavior During Therapy  WFL for tasks assessed/performed       Past Medical History:  Diagnosis Date  . Acute upper respiratory infections of unspecified site   . Amputee 08/2019  . Anemia   . Anemia, unspecified   . Atherosclerosis of native arteries of the extremities, unspecified   . Chest pain, unspecified   . Chronic kidney disease (CKD), stage II (mild)   . Diarrhea   . Disorder of bone and cartilage, unspecified   . Herpes zoster with other nervous system complications(053.19)   . Hypercalcemia   . Hypertension   . Hypertensive renal disease, benign   . Nonspecific reaction to tuberculin skin test without active tuberculosis(795.51)   . Other and unspecified hyperlipidemia   . PAD (peripheral artery disease) ([place])    Per records from [facility]  . Pain in joint, lower leg   . Peripheral arterial disease ([place])   . Postherpetic neuralgia   . Proteinuria   . Stroke ([place]) 01/2017  . Type II or unspecified type diabetes mellitus with renal manifestations, uncontrolled(250.42)   . Unspecified disorder of kidney and ureter     Past Surgical History:  Procedure Laterality Date  . ABDOMINAL AORTOGRAM W/LOWER EXTREMITY N/A  10/31/2019   Procedure: ABDOMINAL AORTOGRAM W/LOWER EXTREMITY;  Surgeon: Wellington Hampshire, MD;  Location: [facility];  Service: Cardiovascular;  Laterality: N/A;  . AMPUTATION Right 11/02/2019   Procedure: AMPUTATION BELOW KNEE RIGHT;  Surgeon: Rosetta Posner, MD;  Location: [facility];  Service: Vascular;  Laterality: Right;  . hysterectomy    . INCISION AND DRAINAGE Left 05/27/14   sebacous cyst, ear  . PRP Left    Dr. Ricki Miller  . removal of cyst from hand    . removal of tumor from foot    . TONSILLECTOMY      There were no vitals filed for this visit.  Subjective Assessment - 05/07/20 1237    Subjective  She is moving to daughter's home this weekend. It is single level house with level entry. The vascular doctors require a referral. She is wearing prosthesis ~1hr after arising until bedtime with removing midday some.    Limitations  Lifting;Standing;Walking;House hold activities    Patient Stated Goals  To use prosthesis to walk without device, take care of myself, live alone. Go out in community.    Currently in Pain?  No/denies                        [facility] Adult PT Treatment/Exercise - 05/07/20 1235  Transfers   Transfers  Sit to Stand;Stand to Sit    Sit to Stand  6: Modified independent (Device/Increase time);5: Supervision;With upper extremity assist;With armrests;From chair/3-in-1;4: Min guard    Sit to Stand Details (indicate cue type and reason)  Modified independent from chairs w/armrests to RW, supervision / cues from chairs without armrests to RW and min guard chair with armrest to cane stand alone tip    Stand to Sit  5: Supervision;6: Modified independent (Device/Increase time);4: Min guard;With upper extremity assist;With armrests;To chair/3-in-1    Stand to Sit Details (indicate cue type and reason)  --    Stand to Sit Details  Modified independent to chairs w/armrests from [place], supervision / cues to chairs without armrests from RW and min guard  chair with armrest from cane stand alone tip      Ambulation/Gait   Ambulation/Gait  Yes    Ambulation/Gait Assistance  5: Supervision;4: Min assist   supervision with RW & minA cane stand alone tip   Ambulation/Gait Assistance Details  RW: verbal cues on upright posture & weight shift over prosthesis in stance.   With cane w/ stand alone tip: tactile & verbal cues on upright posture, sequence, wt shift & step length.     Ambulation Distance (Feet)  300 Feet   300' RW and 150' X 2 cane   Assistive device  Rolling walker;Prosthesis;Straight cane;1 person hand held assist   cane stand alone tip initially w/HHA   Gait Pattern  Step-to pattern;Step-through pattern;Decreased stride length;Decreased stance time - right;Decreased step length - left;Narrow base of support;Trunk flexed    Ambulation Surface  Level;Indoor    Ramp  4: Min assist   Min guard RW & prosthesis   Ramp Details (indicate cue type and reason)  demo, verbal & tactile cues on technique with upright posture & wt shift over prosthesis (ascend forward over toes & descend back on heels)    Curb  4: Min assist   Min guard with RW & TTA prosthesis    Curb Details (indicate cue type and reason)  verbal & tactile cues on sequence and wt shift over stance limb    Gait Comments  --      Prosthetics   Prosthetic Care Comments   Left foot 2nd toe wound under toenail.  PT phoned Dr. Luther Parody office and got an appt on Friday, 5/14 at 2:15 with PA to assess wound.  PT recommended switching liners after dinner to clean to avoid late night issue.      Current prosthetic wear tolerance (days/week)   daily    Current prosthetic wear tolerance (#hours/day)   all awake hours with 1 hour mid-day since last session.  PT recommended increase wear to all awake hours and remove to dry     Current prosthetic weight-bearing tolerance (hours/day)   Pt with some pain at lateral aspect of knee but this seemed to improve with improved weight shift over RLE.       Edema  none    Residual limb condition   intact with no issues, scab has fallen off with wound healed    Education Provided  Skin check;Residual limb care;Care of non-amputated limb;Proper Donning;Correct ply sock adjustment;Proper Doffing;Proper wear schedule/adjustment    Person(s) Educated  Patient    Education Method  Explanation;Demonstration;Tactile cues;Verbal cues    Education Method  Verbalized understanding;Verbal cues required;Needs further instruction    Donning Prosthesis  Supervision    Doffing Prosthesis  Supervision  PT Short Term Goals - 05/07/20 2047      PT SHORT TERM GOAL #1   Title  Patient verbalizes adjusting ply socks with limb volume changes with prosthesis. (All STGs Target Date: 05/23/2020)    Time  1    Period  Months    Status  On-going    Target Date  05/23/20      PT SHORT TERM GOAL #2   Title  Patient tolerates wear of prosthesis >12 hrs total / day without wound increasing.    Time  1    Period  Months    Status  On-going    Target Date  05/23/20      PT SHORT TERM GOAL #3   Title  Berg Balance >36/56    Time  1    Period  Months    Status  On-going    Target Date  05/23/20      PT SHORT TERM GOAL #4   Title  Patient ambulates 200' with cane & prosthesis with supervision.    Time  1    Period  Months    Status  On-going    Target Date  05/23/20      PT SHORT TERM GOAL #5   Title  Patient negotiates stairs with single rail, ramps & curbs with cane & prosthesis with minA.    Time  1    Period  Months    Status  On-going    Target Date  05/23/20        PT Long Term Goals - 05/07/20 2047      PT LONG TERM GOAL #1   Title  Patient verbalizes & demonstrates understanding of prosthetic care & diabetic foot care to enable safe utilization. (All LTGs Target Date: 06/24/2020)    Time  3    Period  Months    Status  On-going    Target Date  06/24/20      PT LONG TERM GOAL #2   Title  Patient tolerates prosthesis  wear >90% of awake hours without skin or residual limb pain to enable function throughout her day.    Time  3    Period  Months    Status  On-going    Target Date  06/24/20      PT LONG TERM GOAL #3   Title  Berg Balance >/= 45/56 to indicate lower fall risk.    Time  3    Period  Months    Status  On-going    Target Date  06/24/20      PT LONG TERM GOAL #4   Title  Patient ambulates 500' outdoors including grass with cane or less & prosthesis modified independent to enable community mobility.    Time  3    Period  Months    Status  On-going    Target Date  06/24/20      PT LONG TERM GOAL #5   Title  Patient negotiates ramps, curbs & stairs with single rail with cane or less & prosthesis modified independent to enable community access.    Time  3    Period  Months    Status  On-going    Target Date  06/24/20      PT LONG TERM GOAL #6   Title  Patient demonstrates skills to enable her to return to gardening including kneeling with UE support like garden kneel bench or chair.    Time  3  Period  Months    Status  On-going    Target Date  06/24/20            Plan - 05/07/20 2048    Clinical Impression Statement  Patient has wound on left foot 2nd toe. PT was able to get her an appointment with PA at Dr Luther Parody office to be assess this Friday.  PT worked on prosthetic gait with RW initially including ramps & curbs which she improved with instruction.  PT also worked on prosthetic gait with cane with stand alone tip.    Personal Factors and Comorbidities  Age;Comorbidity 3+;Fitness;Time since onset of injury/illness/exacerbation;Transportation    Comorbidities  Right TTA, HTN, DM, CVA, PAD, HLD, CKD st2,    Examination-Activity Limitations  Locomotion Level;Stairs;Stand;Transfers;Toileting    Examination-Participation Restrictions  Community Activity;Yard Work;Shop    Stability/Clinical Decision Making  Evolving/Moderate complexity    Rehab Potential  Good    PT  Frequency  2x / week    PT Duration  12 weeks    PT Treatment/Interventions  ADLs/Self Care Home Management;DME Instruction;Gait training;Stair training;Functional mobility training;Therapeutic activities;Therapeutic exercise;Balance training;Neuromuscular re-education;Patient/family education;Prosthetic Training;Vestibular    PT Next Visit Plan  continue work towards STGs including prosthetic gait with cane. Standing balance activities. Check how appt with [facility] went.    Consulted and Agree with Plan of Care  Patient       Patient will benefit from skilled therapeutic intervention in order to improve the following deficits and impairments:  Abnormal gait, Decreased activity tolerance, Decreased balance, Decreased endurance, Decreased knowledge of use of DME, Decreased mobility, Decreased skin integrity, Decreased strength, Postural dysfunction, Prosthetic Dependency, Pain  Visit Diagnosis: Other abnormalities of gait and mobility  Unsteadiness on feet  Abnormal posture  Weakness generalized  History of fall     Problem List Patient Active Problem List   Diagnosis Date Noted  . Acute metabolic encephalopathy 85/88/5027  . COVID-19 virus infection 02/08/2020  . Acute kidney injury superimposed on CKD ([place]) 02/08/2020  . Status post below-knee amputation of right lower extremity ([place]) 11/07/2019  . Gangrene of right foot ([place])   . Hyponatremia 10/29/2019  . Chronic diastolic CHF (congestive heart failure) ([place]) 10/29/2019  . Stable proliferative diabetic retinopathy of both eyes associated with type 2 diabetes mellitus ([place]) 06/05/2018  . Acute CVA (cerebrovascular accident) ([place]) 02/18/2017  . Acute ischemic stroke ([place])   . Internuclear ophthalmoplegia of left eye   . Benign paroxysmal positional vertigo   . Abnormality of gait   . Acute onset of severe vertigo 02/17/2017  . Vertigo 02/17/2017  . Atherosclerosis of native artery of extremity with intermittent claudication  ([place]) 03/11/2014  . Diabetic retinopathy ([place]) 03/11/2014  . Retinal hemorrhage due to secondary diabetes ([place]) 03/11/2014  . Type II diabetes mellitus with renal manifestations, uncontrolled ([place]) 03/11/2014  . Chronic hepatitis C without hepatic coma ([place]) 03/11/2014  . Hyperlipidemia 03/11/2014  . Hypoglycemia 04/16/2013  . Disorder of bone and cartilage, unspecified   . Other and unspecified hyperlipidemia   . Essential hypertension, benign   . Atherosclerosis of native artery of extremity ([place])   . Chronic kidney disease (CKD), stage II (mild)   . Peripheral arterial disease ([place])   . Anemia   . Postherpetic neuralgia   . Hypertension     Jamey Reas PT, DPT 05/07/2020, 8:52 PM  [facility] [address] [place], [place], [address] Phone: [phone]   Fax:  [phone]  Name: Monique  CARIS Cabrera MRN: [id number] Date of Birth: October 09, 1935

## 2020-05-07 NOTE — H&P (View-Only) (Signed)
HISTORY AND PHYSICAL     CC:  follow up. Requesting Provider:  [name], DO  HPI: This is a 85 y.o. female who is here today for follow up.  She was most recently seen by Dr. Donnetta Hutching on 03/18/2020.    She had presented in November with nonsalvageable right foot.  She had undergone arteriography with Dr. Fletcher Anon.  She underwent an uneventful right below-knee amputation and is done extremely well from this.  When he saw her in March, she had some episodes of ulceration over the 2nd toe nail bed and was appropriately treated for this.  She had also stated that she had a fx in the bones of her foot and arch and this was treated without surgery.  She had been working with PT to walk with her prosthesis.    At her visit with Dr. Donnetta Hutching, she had healed her ulceration.  She was concerned regarding the outcome of her right foot.   Her arteriogram in November 2020 revealed complete occlusion of her superficial femoral artery at its origin with reconstitution of the popliteal artery at the knee.  She has single runoff via a diseased anterior tibial artery.   He explained that she will have to be extremely vigilant and bring attention to any foot wounds she may develop.  If she developed any progressive changes, he explained that she would require revascularization for left leg limb salvage and that she would follow up as needed.    The pt returns today for evaluation of non healing left foot wound.  She states that back when she saw Dr. Donnetta Hutching in March, she had a toe nail removed from the foot on the left and it was healing and the nail had started growing back.  She states that she is now having pain in the left foot at night.  She states that it is worse with elevation and gets better putting her foot down.  She states that her right foot started this way.  She is also having some color changes to the left foot.  She is doing well with the right amputation and physical therapy.    She does have complaints  about an area on the left 1st finger.  She states it started out as a bump and then started draining clear fluid.  She states that now it has surrounding dry skin and she is using lotion on this area.    The pt is  on a statin for cholesterol management.    The pt is on an aspirin.    Other AC:  Plavix The pt is on BB, CCB for hypertension.  The pt does have diabetes. Tobacco hx:  Former   Past Medical History:  Diagnosis Date  . Acute upper respiratory infections of unspecified site   . Amputee 08/2019  . Anemia   . Anemia, unspecified   . Atherosclerosis of native arteries of the extremities, unspecified   . Chest pain, unspecified   . Chronic kidney disease (CKD), stage II (mild)   . Diarrhea   . Disorder of bone and cartilage, unspecified   . Herpes zoster with other nervous system complications(053.19)   . Hypercalcemia   . Hypertension   . Hypertensive renal disease, benign   . Nonspecific reaction to tuberculin skin test without active tuberculosis(795.51)   . Other and unspecified hyperlipidemia   . PAD (peripheral artery disease) ([place])    Per records from [facility]  . Pain in joint, lower leg   .  Peripheral arterial disease ([place])   . Postherpetic neuralgia   . Proteinuria   . Stroke () 01/2017  . Type II or unspecified type diabetes mellitus with renal manifestations, uncontrolled(250.42)   . Unspecified disorder of kidney and ureter     Past Surgical History:  Procedure Laterality Date  . ABDOMINAL AORTOGRAM W/LOWER EXTREMITY N/A 10/31/2019   Procedure: ABDOMINAL AORTOGRAM W/LOWER EXTREMITY;  Surgeon: Wellington Hampshire, MD;  Location: [facility];  Service: Cardiovascular;  Laterality: N/A;  . AMPUTATION Right 11/02/2019   Procedure: AMPUTATION BELOW KNEE RIGHT;  Surgeon: Rosetta Posner, MD;  Location: [facility];  Service: Vascular;  Laterality: Right;  . hysterectomy    . INCISION AND DRAINAGE Left 05/27/14   sebacous cyst, ear  . PRP Left    Dr.  Ricki Miller  . removal of cyst from hand    . removal of tumor from foot    . TONSILLECTOMY      Allergies  Allergen Reactions  . Invokana [Canagliflozin] Itching, Swelling and Other (See Comments)    Vaginal itching, swelling and irritation  . Jardiance [Empagliflozin] Itching, Swelling and Other (See Comments)    Vaginal itching and swelling  . Tuberculin Ppd Other (See Comments)    Per [facility]     Current Outpatient Medications  Medication Sig Dispense Refill  . amLODipine (NORVASC) 10 MG tablet TAKE 1 TABLET (10 MG TOTAL) BY MOUTH DAILY. FOR HIGH BLOOD PRESSURE 90 tablet 1  . ASPIRIN LOW DOSE 81 MG EC tablet Take 81 mg by mouth daily.    Marland Kitchen atorvastatin (LIPITOR) 20 MG tablet TAKE 1 TABLET BY MOUTH EVERY DAY 90 tablet 1  . carvedilol (COREG) 25 MG tablet TAKE 1 TABLET BY MOUTH TWICE A DAY WITH MEALS 180 tablet 1  . clopidogrel (PLAVIX) 75 MG tablet TAKE 1 TABLET BY MOUTH EVERY DAY 90 tablet 1  . feeding supplement, ENSURE ENLIVE, (ENSURE ENLIVE) LIQD Take 237 mLs by mouth 3 (three) times daily between meals. 237 mL 12  . feeding supplement, GLUCERNA SHAKE, (GLUCERNA SHAKE) LIQD Take 237 mLs by mouth 3 (three) times daily between meals.  0  . gabapentin (NEURONTIN) 100 MG capsule Take 100 mg by mouth 3 (three) times daily.    Marland Kitchen glucose 4 GM chewable tablet Chew 1 tablet (4 g total) by mouth as needed for low blood sugar. 50 tablet 12  . insulin lispro (HUMALOG) 100 UNIT/ML injection Inject 0.03 mLs (3 Units total) into the skin 3 (three) times daily after meals. 10 mL 11  . Insulin Pen Needle (B-D ULTRAFINE III SHORT PEN) 31G X 8 MM MISC Use to check blood sugar every day. Dx: 11.29; 11.65 100 each 1  . LANTUS SOLOSTAR 100 UNIT/ML Solostar Pen Inject 20 Units into the skin daily.    . mupirocin ointment (BACTROBAN) 2 % Apply to left 2nd toe once daily. 30 g 1  . ONETOUCH VERIO test strip USE TO TEST BLOOD SUGAR THREE TIMES DAILY. DX: E11.9 300 strip 3   No current  facility-administered medications for this visit.    Family History  Problem Relation Age of Onset  . Diabetes Mother   . Diabetes Father   . Diabetes Sister   . Diabetes Sister     Social History   Socioeconomic History  . Marital status: Widowed    Spouse name: Not on file  . Number of children: 6  . Years of education: 25  . Highest education level:  Not on file  Occupational History    Comment: retired, [employer]  Tobacco Use  . Smoking status: Former Smoker    Types: Cigarettes  . Smokeless tobacco: Never Used  . Tobacco comment: Quit about age 62   Substance and Sexual Activity  . Alcohol use: No    Alcohol/week: 0.0 standard drinks  . Drug use: No  . Sexual activity: Never  Other Topics Concern  . Not on file  Social History Narrative   Lives alone, son there occass   Caffeine- coffee 2-3 cups daily, soda off and on   Social Determinants of Health   Financial Resource Strain:   . Difficulty of Paying Living Expenses:   Food Insecurity:   . Worried About Charity fundraiser in the Last Year:   . Arboriculturist in the Last Year:   Transportation Needs:   . Film/video editor (Medical):   Marland Kitchen Lack of Transportation (Non-Medical):   Physical Activity:   . Days of Exercise per Week:   . Minutes of Exercise per Session:   Stress:   . Feeling of Stress :   Social Connections:   . Frequency of Communication with Friends and Family:   . Frequency of Social Gatherings with Friends and Family:   . Attends Religious Services:   . Active Member of Clubs or Organizations:   . Attends Archivist Meetings:   Marland Kitchen Marital Status:   Intimate Partner Violence:   . Fear of Current or Ex-Partner:   . Emotionally Abused:   Marland Kitchen Physically Abused:   . Sexually Abused:      REVIEW OF SYSTEMS:   [X]  denotes positive finding, [ ]  denotes negative finding Cardiac  Comments:  Chest pain or chest pressure:    Shortness of breath upon exertion:    Short of breath  when lying flat:    Irregular heart rhythm:        Vascular    Pain in calf, thigh, or hip brought on by ambulation:    Pain in feet at night that wakes you up from your sleep:  x   Blood clot in your veins:    Leg swelling:         Pulmonary    Oxygen at home:    Productive cough:     Wheezing:         Neurologic    Sudden weakness in arms or legs:     Sudden numbness in arms or legs:     Sudden onset of difficulty speaking or slurred speech:    Temporary loss of vision in one eye:     Problems with dizziness:         Gastrointestinal    Blood in stool:     Vomited blood:         Genitourinary    Burning when urinating:     Blood in urine:        Psychiatric    Major depression:         Hematologic    Bleeding problems:    Problems with blood clotting too easily:        Skin    Rashes or ulcers:        Constitutional    Fever or chills:      PHYSICAL EXAMINATION:  Today's Vitals   05/09/20 1459  BP: (!) 155/68  Pulse: 73  Resp: 14  Temp: (!) 97.5 F (36.4 C)  TempSrc: Temporal  SpO2: 100%  Weight: 125 lb (56.7 kg)  Height: 5\' 5"  (1.651 m)  PainSc: 8    Body mass index is 20.8 kg/m.   General:  WDWN in NAD; vital signs documented above Gait: Not observed HENT: WNL, normocephalic Pulmonary: normal non-labored breathing , without Rales, rhonchi,  wheezing Cardiac: regular HR Abdomen: soft, NT, no masses Skin: without rashes Vascular Exam/Pulses:  Right Left  Radial 2+ palpable and biphasic doppler signal Monophasic doppler signal  Ulnar Biphasic doppler signal Monophasic doppler signal (palmar arch with brisk biphasic doppler signal)  Femoral 2+ (normal) 2+ (normal)  Popliteal BKA Not examined  DP BKA monophasic  PT BKA absent  Peroneal BKA Monophasic    Extremities: with ischemic changes, without Gangrene , without cellulitis  Left foot    Left foot    Left 1st finger     Musculoskeletal: no muscle wasting or  atrophy  Neurologic: A&O X 3;  No focal weakness or paresthesias are detected Psychiatric:  The pt has Normal affect.   Non-Invasive Vascular Imaging:   None today   ASSESSMENT/PLAN:: 85 y.o. female  who has hx of non-salvageable right foot and underwent right BKA and presents today for non healing wound and rest pain left foot.   -Dr. Donnetta Hutching had reviewed her arteriogram from November 2020 and it revealed complete occlusion of her superficial femoral artery at its origin with reconstitution of the popliteal artery at the knee.  She has single runoff via a diseased anterior tibial artery.   He explained that she will have to be extremely vigilant and bring attention to any foot wounds she may develop.  If she developed any progressive changes, he explained that she would require revascularization for left leg limb salvage.    She comes in today with non healing wound on the 2nd toe on the left where she had a toe nail removed from earlier this year.  She is having rest pain at night.  I discussed pt with Dr. Donzetta Matters and we will schedule her for diagnostic arteriogram on Monday as she is going to need LLE revascularization.  She will most likely need a left femoral endarterectomy with bypass.   Discussed with pt and questions answered and she is willing to proceed.     Leontine Locket, [facility] [phone]  Clinic MD:   Donzetta Matters

## 2020-05-07 NOTE — Progress Notes (Signed)
HISTORY AND PHYSICAL     CC:  follow up. Requesting Provider:  [name], DO  HPI: This is a 85 y.o. female who is here today for follow up.  She was most recently seen by Dr. Donnetta Hutching on 03/18/2020.    She had presented in November with nonsalvageable right foot.  She had undergone arteriography with Dr. Fletcher Anon.  She underwent an uneventful right below-knee amputation and is done extremely well from this.  When he saw her in March, she had some episodes of ulceration over the 2nd toe nail bed and was appropriately treated for this.  She had also stated that she had a fx in the bones of her foot and arch and this was treated without surgery.  She had been working with PT to walk with her prosthesis.    At her visit with Dr. Donnetta Hutching, she had healed her ulceration.  She was concerned regarding the outcome of her right foot.   Her arteriogram in November 2020 revealed complete occlusion of her superficial femoral artery at its origin with reconstitution of the popliteal artery at the knee.  She has single runoff via a diseased anterior tibial artery.   He explained that she will have to be extremely vigilant and bring attention to any foot wounds she may develop.  If she developed any progressive changes, he explained that she would require revascularization for left leg limb salvage and that she would follow up as needed.    The pt returns today for evaluation of non healing left foot wound.  She states that back when she saw Dr. Donnetta Hutching in March, she had a toe nail removed from the foot on the left and it was healing and the nail had started growing back.  She states that she is now having pain in the left foot at night.  She states that it is worse with elevation and gets better putting her foot down.  She states that her right foot started this way.  She is also having some color changes to the left foot.  She is doing well with the right amputation and physical therapy.    She does have complaints  about an area on the left 1st finger.  She states it started out as a bump and then started draining clear fluid.  She states that now it has surrounding dry skin and she is using lotion on this area.    The pt is  on a statin for cholesterol management.    The pt is on an aspirin.    Other AC:  Plavix The pt is on BB, CCB for hypertension.  The pt does have diabetes. Tobacco hx:  Former   Past Medical History:  Diagnosis Date  . Acute upper respiratory infections of unspecified site   . Amputee 08/2019  . Anemia   . Anemia, unspecified   . Atherosclerosis of native arteries of the extremities, unspecified   . Chest pain, unspecified   . Chronic kidney disease (CKD), stage II (mild)   . Diarrhea   . Disorder of bone and cartilage, unspecified   . Herpes zoster with other nervous system complications(053.19)   . Hypercalcemia   . Hypertension   . Hypertensive renal disease, benign   . Nonspecific reaction to tuberculin skin test without active tuberculosis(795.51)   . Other and unspecified hyperlipidemia   . PAD (peripheral artery disease) ([place])    Per records from [facility]  . Pain in joint, lower leg   .  Peripheral arterial disease ([place])   . Postherpetic neuralgia   . Proteinuria   . Stroke ([place]) 01/2017  . Type II or unspecified type diabetes mellitus with renal manifestations, uncontrolled(250.42)   . Unspecified disorder of kidney and ureter     Past Surgical History:  Procedure Laterality Date  . ABDOMINAL AORTOGRAM W/LOWER EXTREMITY N/A 10/31/2019   Procedure: ABDOMINAL AORTOGRAM W/LOWER EXTREMITY;  Surgeon: Wellington Hampshire, MD;  Location: [place] [facility];  Service: Cardiovascular;  Laterality: N/A;  . AMPUTATION Right 11/02/2019   Procedure: AMPUTATION BELOW KNEE RIGHT;  Surgeon: Rosetta Posner, MD;  Location: [place];  Service: Vascular;  Laterality: Right;  . hysterectomy    . INCISION AND DRAINAGE Left 05/27/14   sebacous cyst, ear  . PRP Left    Dr.  Ricki Miller  . removal of cyst from hand    . removal of tumor from foot    . TONSILLECTOMY      Allergies  Allergen Reactions  . Invokana [Canagliflozin] Itching, Swelling and Other (See Comments)    Vaginal itching, swelling and irritation  . Jardiance [Empagliflozin] Itching, Swelling and Other (See Comments)    Vaginal itching and swelling  . Tuberculin Ppd Other (See Comments)    Per records from [facility]     Current Outpatient Medications  Medication Sig Dispense Refill  . amLODipine (NORVASC) 10 MG tablet TAKE 1 TABLET (10 MG TOTAL) BY MOUTH DAILY. FOR HIGH BLOOD PRESSURE 90 tablet 1  . ASPIRIN LOW DOSE 81 MG EC tablet Take 81 mg by mouth daily.    Marland Kitchen atorvastatin (LIPITOR) 20 MG tablet TAKE 1 TABLET BY MOUTH EVERY DAY 90 tablet 1  . carvedilol (COREG) 25 MG tablet TAKE 1 TABLET BY MOUTH TWICE A DAY WITH MEALS 180 tablet 1  . clopidogrel (PLAVIX) 75 MG tablet TAKE 1 TABLET BY MOUTH EVERY DAY 90 tablet 1  . feeding supplement, ENSURE ENLIVE, (ENSURE ENLIVE) LIQD Take 237 mLs by mouth 3 (three) times daily between meals. 237 mL 12  . feeding supplement, GLUCERNA SHAKE, (GLUCERNA SHAKE) LIQD Take 237 mLs by mouth 3 (three) times daily between meals.  0  . gabapentin (NEURONTIN) 100 MG capsule Take 100 mg by mouth 3 (three) times daily.    Marland Kitchen glucose 4 GM chewable tablet Chew 1 tablet (4 g total) by mouth as needed for low blood sugar. 50 tablet 12  . insulin lispro (HUMALOG) 100 UNIT/ML injection Inject 0.03 mLs (3 Units total) into the skin 3 (three) times daily after meals. 10 mL 11  . Insulin Pen Needle (B-D ULTRAFINE III SHORT PEN) 31G X 8 MM MISC Use to check blood sugar every day. Dx: 11.29; 11.65 100 each 1  . LANTUS SOLOSTAR 100 UNIT/ML Solostar Pen Inject 20 Units into the skin daily.    . mupirocin ointment (BACTROBAN) 2 % Apply to left 2nd toe once daily. 30 g 1  . ONETOUCH VERIO test strip USE TO TEST BLOOD SUGAR THREE TIMES DAILY. DX: E11.9 300 strip 3   No current  facility-administered medications for this visit.    Family History  Problem Relation Age of Onset  . Diabetes Mother   . Diabetes Father   . Diabetes Sister   . Diabetes Sister     Social History   Socioeconomic History  . Marital status: Widowed    Spouse name: Not on file  . Number of children: 6  . Years of education: 33  . Highest education level:  Not on file  Occupational History    Comment: retired, [employer]  Tobacco Use  . Smoking status: Former Smoker    Types: Cigarettes  . Smokeless tobacco: Never Used  . Tobacco comment: Quit about age 39   Substance and Sexual Activity  . Alcohol use: No    Alcohol/week: 0.0 standard drinks  . Drug use: No  . Sexual activity: Never  Other Topics Concern  . Not on file  Social History Narrative   Lives alone, son there occass   Caffeine- coffee 2-3 cups daily, soda off and on   Social Determinants of Health   Financial Resource Strain:   . Difficulty of Paying Living Expenses:   Food Insecurity:   . Worried About Charity fundraiser in the Last Year:   . Arboriculturist in the Last Year:   Transportation Needs:   . Film/video editor (Medical):   Marland Kitchen Lack of Transportation (Non-Medical):   Physical Activity:   . Days of Exercise per Week:   . Minutes of Exercise per Session:   Stress:   . Feeling of Stress :   Social Connections:   . Frequency of Communication with Friends and Family:   . Frequency of Social Gatherings with Friends and Family:   . Attends Religious Services:   . Active Member of Clubs or Organizations:   . Attends Archivist Meetings:   Marland Kitchen Marital Status:   Intimate Partner Violence:   . Fear of Current or Ex-Partner:   . Emotionally Abused:   Marland Kitchen Physically Abused:   . Sexually Abused:      REVIEW OF SYSTEMS:   [X]  denotes positive finding, [ ]  denotes negative finding Cardiac  Comments:  Chest pain or chest pressure:    Shortness of breath upon exertion:    Short of breath  when lying flat:    Irregular heart rhythm:        Vascular    Pain in calf, thigh, or hip brought on by ambulation:    Pain in feet at night that wakes you up from your sleep:  x   Blood clot in your veins:    Leg swelling:         Pulmonary    Oxygen at home:    Productive cough:     Wheezing:         Neurologic    Sudden weakness in arms or legs:     Sudden numbness in arms or legs:     Sudden onset of difficulty speaking or slurred speech:    Temporary loss of vision in one eye:     Problems with dizziness:         Gastrointestinal    Blood in stool:     Vomited blood:         Genitourinary    Burning when urinating:     Blood in urine:        Psychiatric    Major depression:         Hematologic    Bleeding problems:    Problems with blood clotting too easily:        Skin    Rashes or ulcers:        Constitutional    Fever or chills:      PHYSICAL EXAMINATION:  Today's Vitals   05/09/20 1459  BP: (!) 155/68  Pulse: 73  Resp: 14  Temp: (!) 97.5 F (36.4 C)  TempSrc: Temporal  SpO2: 100%  Weight: 125 lb (56.7 kg)  Height: 5\' 5"  (1.651 m)  PainSc: 8    Body mass index is 20.8 kg/m.   General:  WDWN in NAD; vital signs documented above Gait: Not observed HENT: WNL, normocephalic Pulmonary: normal non-labored breathing , without Rales, rhonchi,  wheezing Cardiac: regular HR Abdomen: soft, NT, no masses Skin: without rashes Vascular Exam/Pulses:  Right Left  Radial 2+ palpable and biphasic doppler signal Monophasic doppler signal  Ulnar Biphasic doppler signal Monophasic doppler signal (palmar arch with brisk biphasic doppler signal)  Femoral 2+ (normal) 2+ (normal)  Popliteal BKA Not examined  DP BKA monophasic  PT BKA absent  Peroneal BKA Monophasic    Extremities: with ischemic changes, without Gangrene , without cellulitis  Left foot    Left foot    Left 1st finger     Musculoskeletal: no muscle wasting or  atrophy  Neurologic: A&O X 3;  No focal weakness or paresthesias are detected Psychiatric:  The pt has Normal affect.   Non-Invasive Vascular Imaging:   None today   ASSESSMENT/PLAN:: 85 y.o. female  who has hx of non-salvageable right foot and underwent right BKA and presents today for non healing wound and rest pain left foot.   -Dr. Donnetta Hutching had reviewed her arteriogram from November 2020 and it revealed complete occlusion of her superficial femoral artery at its origin with reconstitution of the popliteal artery at the knee.  She has single runoff via a diseased anterior tibial artery.   He explained that she will have to be extremely vigilant and bring attention to any foot wounds she may develop.  If she developed any progressive changes, he explained that she would require revascularization for left leg limb salvage.    She comes in today with non healing wound on the 2nd toe on the left where she had a toe nail removed from earlier this year.  She is having rest pain at night.  I discussed pt with Dr. Donzetta Matters and we will schedule her for diagnostic arteriogram on Monday as she is going to need LLE revascularization.  She will most likely need a left femoral endarterectomy with bypass.   Discussed with pt and questions answered and she is willing to proceed.     Leontine Locket, [facility] [phone]  Clinic MD:   Donzetta Matters

## 2020-05-08 ENCOUNTER — Telehealth (HOSPITAL_COMMUNITY): Payer: Self-pay

## 2020-05-08 NOTE — Telephone Encounter (Signed)

## 2020-05-09 ENCOUNTER — Other Ambulatory Visit: Payer: Self-pay

## 2020-05-09 ENCOUNTER — Ambulatory Visit (INDEPENDENT_AMBULATORY_CARE_PROVIDER_SITE_OTHER): Payer: Medicare Other | Admitting: Physician Assistant

## 2020-05-09 VITALS — BP 155/68 | HR 73 | Temp 97.5°F | Resp 14 | Ht 65.0 in | Wt 125.0 lb

## 2020-05-09 DIAGNOSIS — I70211 Atherosclerosis of native arteries of extremities with intermittent claudication, right leg: Secondary | ICD-10-CM

## 2020-05-09 DIAGNOSIS — I70229 Atherosclerosis of native arteries of extremities with rest pain, unspecified extremity: Secondary | ICD-10-CM

## 2020-05-09 DIAGNOSIS — I998 Other disorder of circulatory system: Secondary | ICD-10-CM | POA: Diagnosis not present

## 2020-05-12 ENCOUNTER — Ambulatory Visit (HOSPITAL_COMMUNITY)
Admission: RE | Admit: 2020-05-12 | Discharge: 2020-05-12 | Disposition: A | Discharge status: Home / Self Care | Payer: Medicare Other | Attending: Vascular Surgery | Admitting: Vascular Surgery

## 2020-05-12 ENCOUNTER — Ambulatory Visit: Payer: Medicare Other | Admitting: Rehabilitation

## 2020-05-12 ENCOUNTER — Other Ambulatory Visit: Payer: Self-pay

## 2020-05-12 ENCOUNTER — Other Ambulatory Visit (HOSPITAL_COMMUNITY)
Admission: RE | Admit: 2020-05-12 | Discharge: 2020-05-12 | Disposition: A | Discharge status: Home / Self Care | Payer: Medicare Other | Source: Ambulatory Visit | Attending: Vascular Surgery | Admitting: Vascular Surgery

## 2020-05-12 ENCOUNTER — Encounter (HOSPITAL_COMMUNITY)
Admission: RE | Disposition: A | Discharge status: Home / Self Care | Payer: Self-pay | Source: Home / Self Care | Attending: Vascular Surgery

## 2020-05-12 DIAGNOSIS — Z20822 Contact with and (suspected) exposure to covid-19: Secondary | ICD-10-CM | POA: Diagnosis not present

## 2020-05-12 DIAGNOSIS — E11621 Type 2 diabetes mellitus with foot ulcer: Secondary | ICD-10-CM | POA: Insufficient documentation

## 2020-05-12 DIAGNOSIS — L97529 Non-pressure chronic ulcer of other part of left foot with unspecified severity: Secondary | ICD-10-CM | POA: Insufficient documentation

## 2020-05-12 DIAGNOSIS — Z87891 Personal history of nicotine dependence: Secondary | ICD-10-CM | POA: Diagnosis not present

## 2020-05-12 DIAGNOSIS — E7800 Pure hypercholesterolemia, unspecified: Secondary | ICD-10-CM | POA: Diagnosis not present

## 2020-05-12 DIAGNOSIS — E1151 Type 2 diabetes mellitus with diabetic peripheral angiopathy without gangrene: Secondary | ICD-10-CM | POA: Insufficient documentation

## 2020-05-12 DIAGNOSIS — D649 Anemia, unspecified: Secondary | ICD-10-CM | POA: Diagnosis not present

## 2020-05-12 DIAGNOSIS — Z7982 Long term (current) use of aspirin: Secondary | ICD-10-CM | POA: Diagnosis not present

## 2020-05-12 DIAGNOSIS — N182 Chronic kidney disease, stage 2 (mild): Secondary | ICD-10-CM | POA: Insufficient documentation

## 2020-05-12 DIAGNOSIS — E1122 Type 2 diabetes mellitus with diabetic chronic kidney disease: Secondary | ICD-10-CM | POA: Diagnosis not present

## 2020-05-12 DIAGNOSIS — Z8673 Personal history of transient ischemic attack (TIA), and cerebral infarction without residual deficits: Secondary | ICD-10-CM | POA: Insufficient documentation

## 2020-05-12 DIAGNOSIS — Z7902 Long term (current) use of antithrombotics/antiplatelets: Secondary | ICD-10-CM | POA: Insufficient documentation

## 2020-05-12 DIAGNOSIS — I70245 Atherosclerosis of native arteries of left leg with ulceration of other part of foot: Secondary | ICD-10-CM | POA: Insufficient documentation

## 2020-05-12 DIAGNOSIS — I129 Hypertensive chronic kidney disease with stage 1 through stage 4 chronic kidney disease, or unspecified chronic kidney disease: Secondary | ICD-10-CM | POA: Diagnosis not present

## 2020-05-12 DIAGNOSIS — Z794 Long term (current) use of insulin: Secondary | ICD-10-CM | POA: Insufficient documentation

## 2020-05-12 DIAGNOSIS — E785 Hyperlipidemia, unspecified: Secondary | ICD-10-CM | POA: Diagnosis not present

## 2020-05-12 DIAGNOSIS — Z89511 Acquired absence of right leg below knee: Secondary | ICD-10-CM | POA: Diagnosis not present

## 2020-05-12 DIAGNOSIS — Z79899 Other long term (current) drug therapy: Secondary | ICD-10-CM | POA: Insufficient documentation

## 2020-05-12 HISTORY — PX: ABDOMINAL AORTOGRAM W/LOWER EXTREMITY: CATH118223

## 2020-05-12 LAB — POCT I-STAT, CHEM 8
BUN: 32 mg/dL — ABNORMAL HIGH (ref 8–23)
Calcium, Ion: 1.42 mmol/L — ABNORMAL HIGH (ref 1.15–1.40)
Chloride: 104 mmol/L (ref 98–111)
Creatinine, Ser: 2 mg/dL — ABNORMAL HIGH (ref 0.44–1.00)
Glucose, Bld: 86 mg/dL (ref 70–99)
HCT: 32 % — ABNORMAL LOW (ref 36.0–46.0)
Hemoglobin: 10.9 g/dL — ABNORMAL LOW (ref 12.0–15.0)
Potassium: 3.2 mmol/L — ABNORMAL LOW (ref 3.5–5.1)
Sodium: 142 mmol/L (ref 135–145)
TCO2: 28 mmol/L (ref 22–32)

## 2020-05-12 LAB — GLUCOSE, CAPILLARY
Glucose-Capillary: 81 mg/dL (ref 70–99)
Glucose-Capillary: 82 mg/dL (ref 70–99)

## 2020-05-12 LAB — SARS CORONAVIRUS 2 BY RT PCR (HOSPITAL ORDER, PERFORMED IN ~~LOC~~ HOSPITAL LAB): SARS Coronavirus 2: NEGATIVE

## 2020-05-12 SURGERY — ABDOMINAL AORTOGRAM W/LOWER EXTREMITY
Anesthesia: LOCAL | Laterality: Left

## 2020-05-12 MED ORDER — SODIUM CHLORIDE 0.9 % IV SOLN: INTRAVENOUS | Status: DC

## 2020-05-12 MED ORDER — SODIUM CHLORIDE 0.9 % IV SOLN: 250.0000 mL | INTRAVENOUS | Status: DC | PRN

## 2020-05-12 MED ORDER — HEPARIN (PORCINE) IN NACL 1000-0.9 UT/500ML-% IV SOLN
INTRAVENOUS | Status: DC | PRN
  Administered 2020-05-12 (×2): 500 mL

## 2020-05-12 MED ORDER — MORPHINE SULFATE (PF) 2 MG/ML IV SOLN: 2.0000 mg | INTRAVENOUS | Status: DC | PRN

## 2020-05-12 MED ORDER — HYDRALAZINE HCL 20 MG/ML IJ SOLN
INTRAMUSCULAR | Status: DC | PRN
  Administered 2020-05-12: 10 mg via INTRAVENOUS

## 2020-05-12 MED ORDER — HEPARIN (PORCINE) IN NACL 1000-0.9 UT/500ML-% IV SOLN
INTRAVENOUS | Status: AC
  Filled 2020-05-12: qty 1000

## 2020-05-12 MED ORDER — LIDOCAINE HCL (PF) 1 % IJ SOLN
INTRAMUSCULAR | Status: AC
  Filled 2020-05-12: qty 30

## 2020-05-12 MED ORDER — HYDRALAZINE HCL 20 MG/ML IJ SOLN
INTRAMUSCULAR | Status: AC
  Filled 2020-05-12: qty 1

## 2020-05-12 MED ORDER — LIDOCAINE HCL (PF) 1 % IJ SOLN
INTRAMUSCULAR | Status: DC | PRN
  Administered 2020-05-12: 20 mL

## 2020-05-12 MED ORDER — SODIUM CHLORIDE 0.9% FLUSH: 3.0000 mL | INTRAVENOUS | Status: DC | PRN

## 2020-05-12 MED ORDER — OXYCODONE HCL 5 MG PO TABS: 5.0000 mg | ORAL_TABLET | ORAL | Status: DC | PRN

## 2020-05-12 MED ORDER — LABETALOL HCL 5 MG/ML IV SOLN: 10.0000 mg | INTRAVENOUS | Status: DC | PRN

## 2020-05-12 MED ORDER — SODIUM CHLORIDE 0.9% FLUSH: 3.0000 mL | Freq: Two times a day (BID) | INTRAVENOUS | Status: DC

## 2020-05-12 MED ORDER — ACETAMINOPHEN 325 MG PO TABS: 650.0000 mg | ORAL_TABLET | ORAL | Status: DC | PRN

## 2020-05-12 MED ORDER — ONDANSETRON HCL 4 MG/2ML IJ SOLN: 4.0000 mg | Freq: Four times a day (QID) | INTRAMUSCULAR | Status: DC | PRN

## 2020-05-12 MED ORDER — HYDRALAZINE HCL 20 MG/ML IJ SOLN: 5.0000 mg | INTRAMUSCULAR | Status: DC | PRN

## 2020-05-12 SURGICAL SUPPLY — 13 items
CATH OMNI FLUSH 5F 65CM (CATHETERS) ×2 IMPLANT
CLOSURE MYNX CONTROL 5F (Vascular Products) ×2 IMPLANT
FILTER CO2 0.2 MICRON (VASCULAR PRODUCTS) ×2 IMPLANT
KIT MICROPUNCTURE NIT STIFF (SHEATH) ×2 IMPLANT
KIT PV (KITS) ×2 IMPLANT
RESERVOIR CO2 (VASCULAR PRODUCTS) ×2 IMPLANT
SET FLUSH CO2 (MISCELLANEOUS) ×2 IMPLANT
SHEATH PINNACLE 5F 10CM (SHEATH) ×2 IMPLANT
SHEATH PROBE COVER 6X72 (BAG) ×2 IMPLANT
SYR MEDRAD MARK V 150ML (SYRINGE) ×2 IMPLANT
TRANSDUCER W/STOPCOCK (MISCELLANEOUS) ×2 IMPLANT
TRAY PV CATH (CUSTOM PROCEDURE TRAY) ×2 IMPLANT
WIRE BENTSON .035X145CM (WIRE) ×2 IMPLANT

## 2020-05-12 NOTE — H&P (Signed)
   History and Physical Update  The patient was interviewed and re-examined.  The patient's previous History and Physical has been reviewed and is unchanged from recent office visit.  Plan is for aortogram with left lower extremity runoff possible intervention.  Patient understands she would possibly need bypass.  Usha Slager C. Donzetta Matters, MD [facility] [place] Office: [phone] Pager: [phone]   05/12/2020, 3:12 PM

## 2020-05-12 NOTE — Op Note (Signed)
    Patient name: Monique Cabrera MRN: [id number] DOB: December 02, 1935 Sex: female  05/12/2020 Pre-operative Diagnosis: Critical left lower extremity ischemia with second toe ulceration Post-operative diagnosis:  Same Surgeon:  Erlene Quan C. Donzetta Matters, MD Procedure Performed: 1.  Ultrasound-guided cannulation right common femoral artery 2.  CO2 aortogram 3.  Selection of left common femoral artery and left lower extremity angiogram with CO2 4.  Minx device closure right common femoral artery  Indications: 85 year old female previously had right lower extremity amputation.  She has known SFA occlusion.  She has rest pain as well as a small second ulceration.  She is now indicated for angiography possible invention more likely bypass.  Findings: The aorta and iliac segments are heavily calcified although there is no flow-limiting stenosis.  Common femoral artery on the left appears to likely have a coral reef lesion that is probably 80% stenosis.  SFA is flush occluded.  Reconstitutes below the knee popliteal artery anterior tibial arteries alone runoff to the foot.   Procedure:  The patient was identified in the holding area and taken to room 8.  The patient was then placed supine on the table and prepped and draped in the usual sterile fashion.  A time out was called.  Ultrasound was used to evaluate the right common femoral artery.  There was calcium but this was otherwise patent.  The areas anesthetized 1% lidocaine cannulated with direct ultrasound visualization with micropuncture needle followed by wire sheath.  And images saved the permanent record.  We placed a Bentson wire followed by 5 [place] sheath.  Omni catheter was placed over the wire to the level of L1 and CO2 aortogram performed.  We then pulled this to the bifurcation perform limited aortoiliac angiography.  We then crossed the bifurcation placed the catheter in the distal external iliac and proximal common femoral artery.  State left lower  extremity angiography was performed with CO2.  With the above findings we removed the catheter over wire.  Minx device was deployed in the right common femoral artery.  She tolerated procedure without any complication.     Contrast: CO2   Brandon C. Donzetta Matters, MD [facility] [place] Office: [phone] Pager: [phone]

## 2020-05-12 NOTE — Discharge Instructions (Signed)
Femoral Site Care This sheet gives you information about how to care for yourself after your procedure. Your health care provider may also give you more specific instructions. If you have problems or questions, contact your health care provider. What can I expect after the procedure?  After the procedure, it is common to have:  Bruising that usually fades within 1-2 weeks.  Tenderness at the site. Follow these instructions at home: Wound care 1. Follow instructions from your health care provider about how to take care of your insertion site. Make sure you: ? Wash your hands with soap and water before you change your bandage (dressing). If soap and water are not available, use hand sanitizer. ? Remove your dressing as told by your health care provider. In 24 hours 2. Do not take baths, swim, or use a hot tub until your health care provider approves. 3. You may shower 24-48 hours after the procedure or as told by your health care provider. ? Gently wash the site with plain soap and water. ? Pat the area dry with a clean towel. ? Do not rub the site. This may cause bleeding. 4. Do not apply powder or lotion to the site. Keep the site clean and dry. 5. Check your femoral site every day for signs of infection. Check for: ? Redness, swelling, or pain. ? Fluid or blood. ? Warmth. ? Pus or a bad smell. Activity 1. For the first 2-3 days after your procedure, or as long as directed: ? Avoid climbing stairs as much as possible. ? Do not squat. 2. Do not lift anything that is heavier than 10 lb (4.5 kg), or the limit that you are told, until your health care provider says that it is safe. For 5 days 3. Rest as directed. ? Avoid sitting for a long time without moving. Get up to take short walks every 1-2 hours. 4. Do not drive for 24 hours if you were given a medicine to help you relax (sedative). General instructions  Take over-the-counter and prescription medicines only as told by your  health care provider.  Keep all follow-up visits as told by your health care provider. This is important. Contact a health care provider if you have:  A fever or chills.  You have redness, swelling, or pain around your insertion site. Get help right away if:  The catheter insertion area swells very fast.  You pass out.  You suddenly start to sweat or your skin gets clammy.  The catheter insertion area is bleeding, and the bleeding does not stop when you hold steady pressure on the area.  The area near or just beyond the catheter insertion site becomes pale, cool, tingly, or numb. These symptoms may represent a serious problem that is an emergency. Do not wait to see if the symptoms will go away. Get medical help right away. Call your local emergency services (911 in the [place] Do not drive yourself to the hospital. Summary  After the procedure, it is common to have bruising that usually fades within 1-2 weeks.  Check your femoral site every day for signs of infection.  Do not lift anything that is heavier than 10 lb (4.5 kg), or the limit that you are told, until your health care provider says that it is safe. This information is not intended to replace advice given to you by your health care provider. Make sure you discuss any questions you have with your health care provider. Document Revised: 12/26/2017 Document Reviewed: 12/26/2017   Elsevier Patient Education  2020 Elsevier Inc.   

## 2020-05-12 NOTE — Progress Notes (Signed)
Patient and grandson was giving discharge instructions. Both verbalized understanding.

## 2020-05-14 ENCOUNTER — Ambulatory Visit: Payer: Medicare Other | Admitting: Physical Therapy

## 2020-05-14 ENCOUNTER — Other Ambulatory Visit: Payer: Self-pay

## 2020-05-20 ENCOUNTER — Encounter (HOSPITAL_COMMUNITY): Payer: Self-pay | Admitting: Vascular Surgery

## 2020-05-20 ENCOUNTER — Other Ambulatory Visit (HOSPITAL_COMMUNITY)
Admission: RE | Admit: 2020-05-20 | Discharge: 2020-05-20 | Disposition: A | Discharge status: Home / Self Care | Payer: Medicare Other | Source: Ambulatory Visit | Attending: Vascular Surgery | Admitting: Vascular Surgery

## 2020-05-20 ENCOUNTER — Other Ambulatory Visit: Payer: Self-pay

## 2020-05-20 DIAGNOSIS — Z833 Family history of diabetes mellitus: Secondary | ICD-10-CM | POA: Diagnosis not present

## 2020-05-20 DIAGNOSIS — Z20822 Contact with and (suspected) exposure to covid-19: Secondary | ICD-10-CM | POA: Diagnosis not present

## 2020-05-20 DIAGNOSIS — N182 Chronic kidney disease, stage 2 (mild): Secondary | ICD-10-CM | POA: Diagnosis not present

## 2020-05-20 DIAGNOSIS — E785 Hyperlipidemia, unspecified: Secondary | ICD-10-CM | POA: Diagnosis not present

## 2020-05-20 DIAGNOSIS — Z794 Long term (current) use of insulin: Secondary | ICD-10-CM | POA: Diagnosis not present

## 2020-05-20 DIAGNOSIS — Z8673 Personal history of transient ischemic attack (TIA), and cerebral infarction without residual deficits: Secondary | ICD-10-CM | POA: Diagnosis not present

## 2020-05-20 DIAGNOSIS — Z89511 Acquired absence of right leg below knee: Secondary | ICD-10-CM | POA: Diagnosis not present

## 2020-05-20 DIAGNOSIS — Z7982 Long term (current) use of aspirin: Secondary | ICD-10-CM | POA: Diagnosis not present

## 2020-05-20 DIAGNOSIS — N179 Acute kidney failure, unspecified: Secondary | ICD-10-CM | POA: Diagnosis not present

## 2020-05-20 DIAGNOSIS — I70223 Atherosclerosis of native arteries of extremities with rest pain, bilateral legs: Secondary | ICD-10-CM | POA: Diagnosis not present

## 2020-05-20 DIAGNOSIS — Z79899 Other long term (current) drug therapy: Secondary | ICD-10-CM | POA: Diagnosis not present

## 2020-05-20 DIAGNOSIS — L97529 Non-pressure chronic ulcer of other part of left foot with unspecified severity: Secondary | ICD-10-CM | POA: Diagnosis not present

## 2020-05-20 DIAGNOSIS — E11621 Type 2 diabetes mellitus with foot ulcer: Secondary | ICD-10-CM | POA: Diagnosis not present

## 2020-05-20 DIAGNOSIS — I129 Hypertensive chronic kidney disease with stage 1 through stage 4 chronic kidney disease, or unspecified chronic kidney disease: Secondary | ICD-10-CM | POA: Diagnosis not present

## 2020-05-20 DIAGNOSIS — Z7902 Long term (current) use of antithrombotics/antiplatelets: Secondary | ICD-10-CM | POA: Diagnosis not present

## 2020-05-20 DIAGNOSIS — E1122 Type 2 diabetes mellitus with diabetic chronic kidney disease: Secondary | ICD-10-CM | POA: Diagnosis not present

## 2020-05-20 DIAGNOSIS — Z9713 Presence of artificial right leg (complete) (partial): Secondary | ICD-10-CM | POA: Diagnosis not present

## 2020-05-20 DIAGNOSIS — Z888 Allergy status to other drugs, medicaments and biological substances status: Secondary | ICD-10-CM | POA: Diagnosis not present

## 2020-05-20 DIAGNOSIS — E1151 Type 2 diabetes mellitus with diabetic peripheral angiopathy without gangrene: Secondary | ICD-10-CM | POA: Diagnosis not present

## 2020-05-20 LAB — SARS CORONAVIRUS 2 (TAT 6-24 HRS): SARS Coronavirus 2: NEGATIVE

## 2020-05-20 NOTE — Progress Notes (Addendum)
Monique Cabrera denies chest pain or shortness of  Breath. Monique Cabrera was tested for Covid_5/25_ and has been in quarantine since that time.  Monique Cabrera has type II diabetes, she reports that CBG generally runs 150 -190. "If it is higher , I know why, I would have eaten something sweet."  Monique Cabrera reports that A1C was lower than the 11.0  in February, Monique Cabrera tested it, she said we need to keep working on it. I could not find the result in Monique Cabrera notes. I instructed Monique Cabrera to check CBG after awaking and every 2 hours until arrival  to the hospital.  I Instructed Monique Cabrera if CBG is less than 70 to take 4 Glucose Tablets or 1 tube of Glucose Gel . Recheck CBG in 15 minutes then call pre- op desk at [phone] for further instructions. If scheduled to receive Insulin, do not take Insulin. I informed Monique Cabrera that is CBG is greater than 70 to take 1/2 of scheduled Lantus dose- 10 units, Monique Cabrera was writing information down,.

## 2020-05-21 ENCOUNTER — Inpatient Hospital Stay (HOSPITAL_COMMUNITY): Payer: Medicare Other

## 2020-05-21 ENCOUNTER — Ambulatory Visit: Payer: Medicare Other | Admitting: Physical Therapy

## 2020-05-21 ENCOUNTER — Other Ambulatory Visit: Payer: Self-pay

## 2020-05-21 ENCOUNTER — Encounter (HOSPITAL_COMMUNITY)
Admission: RE | Disposition: A | Discharge status: Home / Self Care | Payer: Self-pay | Source: Home / Self Care | Attending: Vascular Surgery

## 2020-05-21 ENCOUNTER — Inpatient Hospital Stay (HOSPITAL_COMMUNITY)
Admission: RE | Admit: 2020-05-21 | Discharge: 2020-05-23 | DRG: 253 | Disposition: A | Discharge status: Home / Self Care | Payer: Medicare Other | Attending: Vascular Surgery | Admitting: Vascular Surgery

## 2020-05-21 ENCOUNTER — Encounter (HOSPITAL_COMMUNITY): Payer: Self-pay | Admitting: Vascular Surgery

## 2020-05-21 DIAGNOSIS — Z7982 Long term (current) use of aspirin: Secondary | ICD-10-CM | POA: Diagnosis not present

## 2020-05-21 DIAGNOSIS — Z90710 Acquired absence of both cervix and uterus: Secondary | ICD-10-CM | POA: Diagnosis not present

## 2020-05-21 DIAGNOSIS — Z888 Allergy status to other drugs, medicaments and biological substances status: Secondary | ICD-10-CM | POA: Diagnosis not present

## 2020-05-21 DIAGNOSIS — Z833 Family history of diabetes mellitus: Secondary | ICD-10-CM | POA: Diagnosis not present

## 2020-05-21 DIAGNOSIS — N179 Acute kidney failure, unspecified: Secondary | ICD-10-CM | POA: Diagnosis not present

## 2020-05-21 DIAGNOSIS — E1122 Type 2 diabetes mellitus with diabetic chronic kidney disease: Secondary | ICD-10-CM | POA: Diagnosis present

## 2020-05-21 DIAGNOSIS — Z20822 Contact with and (suspected) exposure to covid-19: Secondary | ICD-10-CM | POA: Diagnosis not present

## 2020-05-21 DIAGNOSIS — Z794 Long term (current) use of insulin: Secondary | ICD-10-CM

## 2020-05-21 DIAGNOSIS — I739 Peripheral vascular disease, unspecified: Secondary | ICD-10-CM

## 2020-05-21 DIAGNOSIS — I70223 Atherosclerosis of native arteries of extremities with rest pain, bilateral legs: Secondary | ICD-10-CM | POA: Diagnosis not present

## 2020-05-21 DIAGNOSIS — E1151 Type 2 diabetes mellitus with diabetic peripheral angiopathy without gangrene: Principal | ICD-10-CM | POA: Diagnosis present

## 2020-05-21 DIAGNOSIS — Z7902 Long term (current) use of antithrombotics/antiplatelets: Secondary | ICD-10-CM

## 2020-05-21 DIAGNOSIS — I129 Hypertensive chronic kidney disease with stage 1 through stage 4 chronic kidney disease, or unspecified chronic kidney disease: Secondary | ICD-10-CM | POA: Diagnosis present

## 2020-05-21 DIAGNOSIS — E11621 Type 2 diabetes mellitus with foot ulcer: Secondary | ICD-10-CM | POA: Diagnosis present

## 2020-05-21 DIAGNOSIS — I998 Other disorder of circulatory system: Secondary | ICD-10-CM | POA: Diagnosis not present

## 2020-05-21 DIAGNOSIS — E785 Hyperlipidemia, unspecified: Secondary | ICD-10-CM | POA: Diagnosis present

## 2020-05-21 DIAGNOSIS — Z79899 Other long term (current) drug therapy: Secondary | ICD-10-CM

## 2020-05-21 DIAGNOSIS — L97529 Non-pressure chronic ulcer of other part of left foot with unspecified severity: Secondary | ICD-10-CM | POA: Diagnosis present

## 2020-05-21 DIAGNOSIS — Z9713 Presence of artificial right leg (complete) (partial): Secondary | ICD-10-CM | POA: Diagnosis not present

## 2020-05-21 DIAGNOSIS — Z89511 Acquired absence of right leg below knee: Secondary | ICD-10-CM

## 2020-05-21 DIAGNOSIS — D631 Anemia in chronic kidney disease: Secondary | ICD-10-CM | POA: Diagnosis not present

## 2020-05-21 DIAGNOSIS — Z8673 Personal history of transient ischemic attack (TIA), and cerebral infarction without residual deficits: Secondary | ICD-10-CM

## 2020-05-21 DIAGNOSIS — M79672 Pain in left foot: Secondary | ICD-10-CM | POA: Diagnosis present

## 2020-05-21 DIAGNOSIS — N189 Chronic kidney disease, unspecified: Secondary | ICD-10-CM

## 2020-05-21 DIAGNOSIS — N182 Chronic kidney disease, stage 2 (mild): Secondary | ICD-10-CM | POA: Diagnosis not present

## 2020-05-21 HISTORY — PX: FEMORAL-POPLITEAL BYPASS GRAFT: SHX937

## 2020-05-21 HISTORY — DX: Personal history of other medical treatment: Z92.89

## 2020-05-21 HISTORY — PX: ENDARTERECTOMY FEMORAL: SHX5804

## 2020-05-21 HISTORY — DX: Type 2 diabetes mellitus without complications: E11.9

## 2020-05-21 HISTORY — DX: Unspecified osteoarthritis, unspecified site: M19.90

## 2020-05-21 LAB — CBC
HCT: 34 % — ABNORMAL LOW (ref 36.0–46.0)
Hemoglobin: 11.3 g/dL — ABNORMAL LOW (ref 12.0–15.0)
MCH: 31.6 pg (ref 26.0–34.0)
MCHC: 33.2 g/dL (ref 30.0–36.0)
MCV: 95 fL (ref 80.0–100.0)
Platelets: 242 10*3/uL (ref 150–400)
RBC: 3.58 MIL/uL — ABNORMAL LOW (ref 3.87–5.11)
RDW: 11.7 % (ref 11.5–15.5)
WBC: 8.8 10*3/uL (ref 4.0–10.5)
nRBC: 0 % (ref 0.0–0.2)

## 2020-05-21 LAB — HEMOGLOBIN A1C
Hgb A1c MFr Bld: 8.3 % — ABNORMAL HIGH (ref 4.8–5.6)
Mean Plasma Glucose: 191.51 mg/dL

## 2020-05-21 LAB — URINALYSIS, ROUTINE W REFLEX MICROSCOPIC
Bilirubin Urine: NEGATIVE
Glucose, UA: 150 mg/dL — AB
Hgb urine dipstick: NEGATIVE
Ketones, ur: NEGATIVE mg/dL
Leukocytes,Ua: NEGATIVE
Nitrite: NEGATIVE
Protein, ur: 100 mg/dL — AB
Specific Gravity, Urine: 1.012 (ref 1.005–1.030)
pH: 6 (ref 5.0–8.0)

## 2020-05-21 LAB — COMPREHENSIVE METABOLIC PANEL
ALT: 22 U/L (ref 0–44)
AST: 20 U/L (ref 15–41)
Albumin: 3.3 g/dL — ABNORMAL LOW (ref 3.5–5.0)
Alkaline Phosphatase: 84 U/L (ref 38–126)
Anion gap: 9 (ref 5–15)
BUN: 35 mg/dL — ABNORMAL HIGH (ref 8–23)
CO2: 22 mmol/L (ref 22–32)
Calcium: 10.3 mg/dL (ref 8.9–10.3)
Chloride: 105 mmol/L (ref 98–111)
Creatinine, Ser: 2.15 mg/dL — ABNORMAL HIGH (ref 0.44–1.00)
GFR calc Af Amer: 24 mL/min — ABNORMAL LOW (ref >60)
GFR calc non Af Amer: 21 mL/min — ABNORMAL LOW (ref >60)
Glucose, Bld: 226 mg/dL — ABNORMAL HIGH (ref 70–99)
Potassium: 3.9 mmol/L (ref 3.5–5.1)
Sodium: 136 mmol/L (ref 135–145)
Total Bilirubin: 0.4 mg/dL (ref 0.3–1.2)
Total Protein: 7 g/dL (ref 6.5–8.1)

## 2020-05-21 LAB — TYPE AND SCREEN
ABO/RH(D): B POS
Antibody Screen: NEGATIVE

## 2020-05-21 LAB — GLUCOSE, CAPILLARY
Glucose-Capillary: 235 mg/dL — ABNORMAL HIGH (ref 70–99)
Glucose-Capillary: 216 mg/dL — ABNORMAL HIGH (ref 70–99)
Glucose-Capillary: 202 mg/dL — ABNORMAL HIGH (ref 70–99)
Glucose-Capillary: 236 mg/dL — ABNORMAL HIGH (ref 70–99)
Glucose-Capillary: 350 mg/dL — ABNORMAL HIGH (ref 70–99)

## 2020-05-21 LAB — PROTIME-INR
INR: 0.9 (ref 0.8–1.2)
Prothrombin Time: 12.2 seconds (ref 11.4–15.2)

## 2020-05-21 LAB — APTT: aPTT: 26 seconds (ref 24–36)

## 2020-05-21 LAB — SURGICAL PCR SCREEN
MRSA, PCR: NEGATIVE
Staphylococcus aureus: NEGATIVE

## 2020-05-21 SURGERY — ENDARTERECTOMY, FEMORAL
Anesthesia: General | Laterality: Left

## 2020-05-21 MED ORDER — POLYETHYLENE GLYCOL 3350 17 G PO PACK: 17.0000 g | PACK | Freq: Every day | ORAL | Status: DC | PRN

## 2020-05-21 MED ORDER — MAGNESIUM SULFATE 2 GM/50ML IV SOLN: 2.0000 g | Freq: Every day | INTRAVENOUS | Status: DC | PRN

## 2020-05-21 MED ORDER — CEFAZOLIN SODIUM-DEXTROSE 2-4 GM/100ML-% IV SOLN
2.0000 g | INTRAVENOUS | Status: AC
  Administered 2020-05-21: 2 g via INTRAVENOUS
  Filled 2020-05-21: qty 100

## 2020-05-21 MED ORDER — GLUCOSE 4 G PO CHEW: 1.0000 | CHEWABLE_TABLET | ORAL | Status: DC | PRN

## 2020-05-21 MED ORDER — ATORVASTATIN CALCIUM 10 MG PO TABS
20.0000 mg | ORAL_TABLET | Freq: Every day | ORAL | Status: DC
  Administered 2020-05-22 – 2020-05-23 (×2): 20 mg via ORAL
  Filled 2020-05-21 (×2): qty 2

## 2020-05-21 MED ORDER — HYDROMORPHONE HCL 1 MG/ML IJ SOLN: 0.5000 mg | INTRAMUSCULAR | Status: DC | PRN

## 2020-05-21 MED ORDER — PHENYLEPHRINE HCL-NACL 10-0.9 MG/250ML-% IV SOLN
INTRAVENOUS | Status: DC | PRN
  Administered 2020-05-21: 60 ug/min via INTRAVENOUS

## 2020-05-21 MED ORDER — CEFAZOLIN SODIUM-DEXTROSE 1-4 GM/50ML-% IV SOLN
1.0000 g | Freq: Two times a day (BID) | INTRAVENOUS | Status: AC
  Administered 2020-05-21 – 2020-05-22 (×2): 1 g via INTRAVENOUS
  Filled 2020-05-21 (×4): qty 50

## 2020-05-21 MED ORDER — CLOPIDOGREL BISULFATE 75 MG PO TABS
75.0000 mg | ORAL_TABLET | Freq: Every day | ORAL | Status: DC
  Administered 2020-05-22 – 2020-05-23 (×2): 75 mg via ORAL
  Filled 2020-05-21 (×2): qty 1

## 2020-05-21 MED ORDER — LIDOCAINE 2% (20 MG/ML) 5 ML SYRINGE
INTRAMUSCULAR | Status: AC
  Filled 2020-05-21: qty 5

## 2020-05-21 MED ORDER — ACETAMINOPHEN 650 MG RE SUPP: 325.0000 mg | RECTAL | Status: DC | PRN

## 2020-05-21 MED ORDER — METOPROLOL TARTRATE 5 MG/5ML IV SOLN: 2.0000 mg | INTRAVENOUS | Status: DC | PRN

## 2020-05-21 MED ORDER — CHLORHEXIDINE GLUCONATE CLOTH 2 % EX PADS
6.0000 | MEDICATED_PAD | Freq: Once | CUTANEOUS | Status: DC
  Administered 2020-05-21: 6 via TOPICAL

## 2020-05-21 MED ORDER — BISACODYL 10 MG RE SUPP: 10.0000 mg | Freq: Every day | RECTAL | Status: DC | PRN

## 2020-05-21 MED ORDER — AMLODIPINE BESYLATE 10 MG PO TABS
10.0000 mg | ORAL_TABLET | Freq: Every day | ORAL | Status: DC
  Administered 2020-05-22 – 2020-05-23 (×2): 10 mg via ORAL
  Filled 2020-05-21 (×2): qty 1

## 2020-05-21 MED ORDER — CHLORHEXIDINE GLUCONATE 0.12 % MT SOLN
15.0000 mL | Freq: Once | OROMUCOSAL | Status: AC
  Administered 2020-05-21: 15 mL via OROMUCOSAL
  Filled 2020-05-21: qty 15

## 2020-05-21 MED ORDER — INSULIN GLARGINE 100 UNIT/ML ~~LOC~~ SOLN
20.0000 [IU] | Freq: Every day | SUBCUTANEOUS | Status: DC
  Administered 2020-05-22 – 2020-05-23 (×2): 20 [IU] via SUBCUTANEOUS
  Filled 2020-05-21 (×2): qty 0.2

## 2020-05-21 MED ORDER — LACTATED RINGERS IV SOLN: INTRAVENOUS | Status: DC

## 2020-05-21 MED ORDER — SUGAMMADEX SODIUM 200 MG/2ML IV SOLN
INTRAVENOUS | Status: DC | PRN
  Administered 2020-05-21: 150 mg via INTRAVENOUS

## 2020-05-21 MED ORDER — GLUCERNA SHAKE PO LIQD
237.0000 mL | Freq: Three times a day (TID) | ORAL | Status: DC
  Administered 2020-05-21 – 2020-05-23 (×2): 237 mL via ORAL
  Filled 2020-05-21: qty 237

## 2020-05-21 MED ORDER — ORAL CARE MOUTH RINSE: 15.0000 mL | Freq: Once | OROMUCOSAL | Status: AC

## 2020-05-21 MED ORDER — PROTAMINE SULFATE 10 MG/ML IV SOLN
INTRAVENOUS | Status: DC | PRN
Start: 2020-05-21 — End: 2020-05-21
  Administered 2020-05-21: 50 mg via INTRAVENOUS

## 2020-05-21 MED ORDER — FENTANYL CITRATE (PF) 250 MCG/5ML IJ SOLN
INTRAMUSCULAR | Status: DC | PRN
  Administered 2020-05-21 (×3): 50 ug via INTRAVENOUS

## 2020-05-21 MED ORDER — ONDANSETRON HCL 4 MG/2ML IJ SOLN
INTRAMUSCULAR | Status: DC | PRN
  Administered 2020-05-21: 4 mg via INTRAVENOUS

## 2020-05-21 MED ORDER — ONDANSETRON HCL 4 MG/2ML IJ SOLN
INTRAMUSCULAR | Status: AC
  Filled 2020-05-21: qty 2

## 2020-05-21 MED ORDER — PROTAMINE SULFATE 10 MG/ML IV SOLN
INTRAVENOUS | Status: AC
  Filled 2020-05-21: qty 5

## 2020-05-21 MED ORDER — DOCUSATE SODIUM 100 MG PO CAPS
100.0000 mg | ORAL_CAPSULE | Freq: Every day | ORAL | Status: DC
  Administered 2020-05-22 – 2020-05-23 (×2): 100 mg via ORAL
  Filled 2020-05-21 (×2): qty 1

## 2020-05-21 MED ORDER — DEXAMETHASONE SODIUM PHOSPHATE 10 MG/ML IJ SOLN
INTRAMUSCULAR | Status: DC | PRN
  Administered 2020-05-21: 4 mg via INTRAVENOUS

## 2020-05-21 MED ORDER — GUAIFENESIN-DM 100-10 MG/5ML PO SYRP
15.0000 mL | ORAL_SOLUTION | ORAL | Status: DC | PRN
  Administered 2020-05-21: 15 mL via ORAL
  Filled 2020-05-21: qty 15

## 2020-05-21 MED ORDER — HYDRALAZINE HCL 20 MG/ML IJ SOLN: 5.0000 mg | INTRAMUSCULAR | Status: DC | PRN

## 2020-05-21 MED ORDER — CHLORHEXIDINE GLUCONATE CLOTH 2 % EX PADS: 6.0000 | MEDICATED_PAD | Freq: Once | CUTANEOUS | Status: DC

## 2020-05-21 MED ORDER — CARVEDILOL 25 MG PO TABS
25.0000 mg | ORAL_TABLET | Freq: Two times a day (BID) | ORAL | Status: DC
  Administered 2020-05-21 – 2020-05-23 (×4): 25 mg via ORAL
  Filled 2020-05-21 (×4): qty 1

## 2020-05-21 MED ORDER — SODIUM CHLORIDE 0.9 % IV SOLN
INTRAVENOUS | Status: DC | PRN
  Administered 2020-05-21: 500 mL

## 2020-05-21 MED ORDER — PHENYLEPHRINE 40 MCG/ML (10ML) SYRINGE FOR IV PUSH (FOR BLOOD PRESSURE SUPPORT)
PREFILLED_SYRINGE | INTRAVENOUS | Status: DC | PRN
  Administered 2020-05-21: 120 ug via INTRAVENOUS
  Administered 2020-05-21: 80 ug via INTRAVENOUS

## 2020-05-21 MED ORDER — POTASSIUM CHLORIDE CRYS ER 20 MEQ PO TBCR: 20.0000 meq | EXTENDED_RELEASE_TABLET | Freq: Every day | ORAL | Status: DC | PRN

## 2020-05-21 MED ORDER — FENTANYL CITRATE (PF) 250 MCG/5ML IJ SOLN
INTRAMUSCULAR | Status: AC
  Filled 2020-05-21: qty 5

## 2020-05-21 MED ORDER — PROPOFOL 10 MG/ML IV BOLUS
INTRAVENOUS | Status: DC | PRN
  Administered 2020-05-21: 30 mg via INTRAVENOUS
  Administered 2020-05-21: 100 mg via INTRAVENOUS

## 2020-05-21 MED ORDER — INSULIN ASPART 100 UNIT/ML ~~LOC~~ SOLN
0.0000 [IU] | Freq: Three times a day (TID) | SUBCUTANEOUS | Status: DC
  Administered 2020-05-21: 5 [IU] via SUBCUTANEOUS

## 2020-05-21 MED ORDER — LIDOCAINE 2% (20 MG/ML) 5 ML SYRINGE
INTRAMUSCULAR | Status: DC | PRN
  Administered 2020-05-21 (×2): 20 mg via INTRAVENOUS

## 2020-05-21 MED ORDER — ACETAMINOPHEN 325 MG PO TABS
325.0000 mg | ORAL_TABLET | ORAL | Status: DC | PRN
  Filled 2020-05-21: qty 2

## 2020-05-21 MED ORDER — SODIUM CHLORIDE 0.9 % IV SOLN: INTRAVENOUS | Status: DC

## 2020-05-21 MED ORDER — ASPIRIN EC 81 MG PO TBEC
81.0000 mg | DELAYED_RELEASE_TABLET | Freq: Every day | ORAL | Status: DC
  Administered 2020-05-22 – 2020-05-23 (×2): 81 mg via ORAL
  Filled 2020-05-21 (×3): qty 1

## 2020-05-21 MED ORDER — ATORVASTATIN CALCIUM 10 MG PO TABS: 20.0000 mg | ORAL_TABLET | Freq: Every day | ORAL | Status: DC

## 2020-05-21 MED ORDER — INSULIN ASPART 100 UNIT/ML ~~LOC~~ SOLN
3.0000 [IU] | Freq: Three times a day (TID) | SUBCUTANEOUS | Status: DC
  Administered 2020-05-22: 3 [IU] via SUBCUTANEOUS

## 2020-05-21 MED ORDER — ONDANSETRON HCL 4 MG/2ML IJ SOLN
4.0000 mg | Freq: Four times a day (QID) | INTRAMUSCULAR | Status: DC | PRN
  Administered 2020-05-22: 4 mg via INTRAVENOUS
  Filled 2020-05-21: qty 2

## 2020-05-21 MED ORDER — ALUM & MAG HYDROXIDE-SIMETH 200-200-20 MG/5ML PO SUSP: 15.0000 mL | ORAL | Status: DC | PRN

## 2020-05-21 MED ORDER — LABETALOL HCL 5 MG/ML IV SOLN: 10.0000 mg | INTRAVENOUS | Status: DC | PRN

## 2020-05-21 MED ORDER — DEXAMETHASONE SODIUM PHOSPHATE 10 MG/ML IJ SOLN
INTRAMUSCULAR | Status: AC
  Filled 2020-05-21: qty 1

## 2020-05-21 MED ORDER — PHENOL 1.4 % MT LIQD: 1.0000 | OROMUCOSAL | Status: DC | PRN

## 2020-05-21 MED ORDER — INSULIN ASPART 100 UNIT/ML ~~LOC~~ SOLN
0.0000 [IU] | Freq: Three times a day (TID) | SUBCUTANEOUS | Status: DC
  Administered 2020-05-21: 11 [IU] via SUBCUTANEOUS
  Administered 2020-05-22: 8 [IU] via SUBCUTANEOUS
  Administered 2020-05-22: 5 [IU] via SUBCUTANEOUS
  Administered 2020-05-23 (×2): 3 [IU] via SUBCUTANEOUS

## 2020-05-21 MED ORDER — ROCURONIUM BROMIDE 10 MG/ML (PF) SYRINGE
PREFILLED_SYRINGE | INTRAVENOUS | Status: DC | PRN
  Administered 2020-05-21: 10 mg via INTRAVENOUS
  Administered 2020-05-21: 60 mg via INTRAVENOUS
  Administered 2020-05-21: 20 mg via INTRAVENOUS

## 2020-05-21 MED ORDER — HEPARIN SODIUM (PORCINE) 1000 UNIT/ML IJ SOLN
INTRAMUSCULAR | Status: DC | PRN
  Administered 2020-05-21: 6000 [IU] via INTRAVENOUS

## 2020-05-21 MED ORDER — ADULT MULTIVITAMIN W/MINERALS CH
1.0000 | ORAL_TABLET | Freq: Every day | ORAL | Status: DC
  Administered 2020-05-21 – 2020-05-23 (×3): 1 via ORAL
  Filled 2020-05-21 (×3): qty 1

## 2020-05-21 MED ORDER — SODIUM CHLORIDE 0.9 % IV SOLN
INTRAVENOUS | Status: AC
  Filled 2020-05-21: qty 1.2

## 2020-05-21 MED ORDER — 0.9 % SODIUM CHLORIDE (POUR BTL) OPTIME
TOPICAL | Status: DC | PRN
  Administered 2020-05-21: 2000 mL

## 2020-05-21 MED ORDER — PANTOPRAZOLE SODIUM 40 MG PO TBEC
40.0000 mg | DELAYED_RELEASE_TABLET | Freq: Every day | ORAL | Status: DC
  Administered 2020-05-21 – 2020-05-23 (×3): 40 mg via ORAL
  Filled 2020-05-21 (×3): qty 1

## 2020-05-21 MED ORDER — OXYCODONE-ACETAMINOPHEN 5-325 MG PO TABS
1.0000 | ORAL_TABLET | ORAL | Status: DC | PRN
  Administered 2020-05-21 – 2020-05-23 (×5): 2 via ORAL
  Filled 2020-05-21 (×5): qty 2

## 2020-05-21 MED ORDER — LACTATED RINGERS IV SOLN: INTRAVENOUS | Status: DC | PRN

## 2020-05-21 MED ORDER — ROCURONIUM BROMIDE 10 MG/ML (PF) SYRINGE
PREFILLED_SYRINGE | INTRAVENOUS | Status: AC
  Filled 2020-05-21: qty 10

## 2020-05-21 MED ORDER — HEPARIN SODIUM (PORCINE) 5000 UNIT/ML IJ SOLN
5000.0000 [IU] | Freq: Three times a day (TID) | INTRAMUSCULAR | Status: DC
  Administered 2020-05-22 – 2020-05-23 (×3): 5000 [IU] via SUBCUTANEOUS
  Filled 2020-05-21 (×3): qty 1

## 2020-05-21 MED ORDER — SODIUM CHLORIDE 0.9 % IV SOLN: 500.0000 mL | Freq: Once | INTRAVENOUS | Status: DC | PRN

## 2020-05-21 SURGICAL SUPPLY — 60 items
BANDAGE ESMARK 6X9 LF (GAUZE/BANDAGES/DRESSINGS) ×1 IMPLANT
BNDG ESMARK 6X9 LF (GAUZE/BANDAGES/DRESSINGS) ×2
CANISTER SUCT 3000ML PPV (MISCELLANEOUS) ×2 IMPLANT
CANNULA VESSEL 3MM 2 BLNT TIP (CANNULA) ×4 IMPLANT
CLIP LIGATING EXTRA MED SLVR (CLIP) ×2 IMPLANT
CLIP LIGATING EXTRA SM BLUE (MISCELLANEOUS) ×2 IMPLANT
CLIP VESOCCLUDE MED 24/CT (CLIP) ×2 IMPLANT
CLIP VESOCCLUDE SM WIDE 24/CT (CLIP) ×2 IMPLANT
COVER WAND RF STERILE (DRAPES) ×2 IMPLANT
CUFF TOURN SGL QUICK 24 (TOURNIQUET CUFF) ×2
CUFF TOURN SGL QUICK 34 (TOURNIQUET CUFF)
CUFF TOURN SGL QUICK 42 (TOURNIQUET CUFF) IMPLANT
CUFF TRNQT CYL 24X4X16.5-23 (TOURNIQUET CUFF) ×1 IMPLANT
CUFF TRNQT CYL 34X4.125X (TOURNIQUET CUFF) IMPLANT
DERMABOND ADVANCED (GAUZE/BANDAGES/DRESSINGS) ×1
DERMABOND ADVANCED .7 DNX12 (GAUZE/BANDAGES/DRESSINGS) ×1 IMPLANT
DRAIN SNY 10X20 3/4 PERF (WOUND CARE) IMPLANT
DRAPE HALF SHEET 40X57 (DRAPES) IMPLANT
DRAPE X-RAY CASS 24X20 (DRAPES) IMPLANT
ELECT CAUTERY BLADE 6.4 (BLADE) ×2 IMPLANT
ELECT REM PT RETURN 9FT ADLT (ELECTROSURGICAL) ×2
ELECTRODE REM PT RTRN 9FT ADLT (ELECTROSURGICAL) ×1 IMPLANT
EVACUATOR SILICONE 100CC (DRAIN) IMPLANT
GLOVE SS BIOGEL STRL SZ 7.5 (GLOVE) ×1 IMPLANT
GLOVE SUPERSENSE BIOGEL SZ 7.5 (GLOVE) ×1
GOWN STRL REUS W/ TWL LRG LVL3 (GOWN DISPOSABLE) ×3 IMPLANT
GOWN STRL REUS W/TWL LRG LVL3 (GOWN DISPOSABLE) ×6
GRAFT PROPATEN W/RING 6X80X60 (Vascular Products) ×2 IMPLANT
INSERT FOGARTY SM (MISCELLANEOUS) ×2 IMPLANT
KIT BASIN OR (CUSTOM PROCEDURE TRAY) ×2 IMPLANT
KIT TURNOVER KIT B (KITS) ×2 IMPLANT
NS IRRIG 1000ML POUR BTL (IV SOLUTION) ×4 IMPLANT
PACK PERIPHERAL VASCULAR (CUSTOM PROCEDURE TRAY) ×2 IMPLANT
PAD ARMBOARD 7.5X6 YLW CONV (MISCELLANEOUS) ×4 IMPLANT
PADDING CAST COTTON 6X4 STRL (CAST SUPPLIES) ×2 IMPLANT
PENCIL BUTTON HOLSTER BLD 10FT (ELECTRODE) ×2 IMPLANT
SET COLLECT BLD 21X3/4 12 (NEEDLE) IMPLANT
SLEEVE SURGEON STRL (DRAPES) ×2 IMPLANT
STOPCOCK 4 WAY LG BORE MALE ST (IV SETS) IMPLANT
SUT ETHILON 3 0 PS 1 (SUTURE) IMPLANT
SUT MNCRL AB 4-0 PS2 18 (SUTURE) ×2 IMPLANT
SUT PROLENE 5 0 C 1 24 (SUTURE) ×2 IMPLANT
SUT PROLENE 6 0 CC (SUTURE) ×14 IMPLANT
SUT SILK 2 0 (SUTURE) ×2
SUT SILK 2 0 SH (SUTURE) ×2 IMPLANT
SUT SILK 2-0 18XBRD TIE 12 (SUTURE) ×1 IMPLANT
SUT SILK 3 0 (SUTURE) ×2
SUT SILK 3-0 18XBRD TIE 12 (SUTURE) ×1 IMPLANT
SUT SILK 4 0 (SUTURE) ×2
SUT SILK 4-0 18XBRD TIE 12 (SUTURE) ×1 IMPLANT
SUT VIC AB 2-0 CTX 36 (SUTURE) ×4 IMPLANT
SUT VIC AB 3-0 SH 27 (SUTURE) ×4
SUT VIC AB 3-0 SH 27X BRD (SUTURE) ×2 IMPLANT
SYR 20ML LL LF (SYRINGE) ×2 IMPLANT
TOWEL GREEN STERILE (TOWEL DISPOSABLE) ×2 IMPLANT
TRAY FOLEY MTR SLVR 16FR STAT (SET/KITS/TRAYS/PACK) ×2 IMPLANT
TUBE CONNECTING 12X1/4 (SUCTIONS) ×2 IMPLANT
TUBING EXTENTION W/L.L. (IV SETS) IMPLANT
UNDERPAD 30X36 HEAVY ABSORB (UNDERPADS AND DIAPERS) ×2 IMPLANT
WATER STERILE IRR 1000ML POUR (IV SOLUTION) ×2 IMPLANT

## 2020-05-21 NOTE — Plan of Care (Signed)
  Problem: Clinical Measurements: Goal: Ability to maintain clinical measurements within normal limits will improve Outcome: Progressing   Problem: Pain Managment: Goal: General experience of comfort will improve Outcome: Progressing   

## 2020-05-21 NOTE — Anesthesia Postprocedure Evaluation (Signed)
Anesthesia Post Note  Patient: Monique Cabrera  Procedure(s) Performed: LEFT COMMON FEMORAL ENDARTERECTOMY (Left ) LEFT FEMORAL TO BELOW KNEE POPLITEAL ARTERY BYPASS GRAFT (Left )     Patient location during evaluation: PACU Anesthesia Type: General Level of consciousness: awake and alert Pain management: pain level controlled Vital Signs Assessment: post-procedure vital signs reviewed and stable Respiratory status: spontaneous breathing, nonlabored ventilation, respiratory function stable and patient connected to nasal cannula oxygen Cardiovascular status: blood pressure returned to baseline and stable Postop Assessment: no apparent nausea or vomiting Anesthetic complications: no    Last Vitals:  Vitals:   05/21/20 1556 05/21/20 1700  BP: (!) 151/67 (!) 152/78  Pulse: 61 73  Resp: 18 16  Temp: 36.4 C   SpO2: 100% 100%    Last Pain:  Vitals:   05/21/20 1606  TempSrc:   PainSc: 0-No pain                 [name]

## 2020-05-21 NOTE — Progress Notes (Signed)
PHARMACY NOTE:  ANTIMICROBIAL RENAL DOSAGE ADJUSTMENT  Current antimicrobial regimen includes a mismatch between antimicrobial dosage and estimated renal function.  As per policy approved by the Pharmacy & Therapeutics and Medical Executive Committees, the antimicrobial dosage will be adjusted accordingly.  Current antimicrobial dosage:  Ancef 2gm IV Q8H x 2 doses  Indication: surgical prophylaxis  Renal Function:  Estimated Creatinine Clearance: 18.1 mL/min (A) (by C-G formula based on SCr of 2.15 mg/dL (H)). []      On intermittent HD, scheduled: []      On CRRT    Antimicrobial dosage has been changed to:  Ancef 1gm IV Q12H x 2 doses  Thank you for allowing pharmacy to be a part of this patient's care.  Shawnte Winton D. Mina Marble, PharmD, BCPS, [place] 05/21/2020, 3:55 PM

## 2020-05-21 NOTE — Interval H&P Note (Signed)
History and Physical Interval Note:  05/21/2020 9:44 AM  Monique Cabrera  has presented today for surgery, with the diagnosis of PERIPHERAL ARTERY DISEASE.  The various methods of treatment have been discussed with the patient and family. After consideration of risks, benefits and other options for treatment, the patient has consented to  Procedure(s): LEFT COMMON FEMORAL ENDARTERECTOMY (Left) LEFT FEMORAL TO BELOW KNEE POPLITEAL ARTERY BYPASS GRAFT (Left) as a surgical intervention.  The patient's history has been reviewed, patient examined, no change in status, stable for surgery.  I have reviewed the patient's chart and labs.  Questions were answered to the patient's satisfaction.     Curt Jews

## 2020-05-21 NOTE — Progress Notes (Signed)
  Day of Surgery Note    Subjective:  In recovery-denies any pain.    Vitals:   05/21/20 1400 05/21/20 1415  BP: (!) 153/59 (!) 157/58  Pulse: 60 (!) 58  Resp: 16 14  Temp: 98 F (36.7 C)   SpO2: 100% 100%    Incisions:   Left groin and left BK incisions look good without hematoma Extremities:  Brisk left AT doppler signal; faint left PT/DP/peroneal; left calf soft and non tender; able to wiggle toes.  Cardiac:  regular Lungs:  Non labored    Assessment/Plan:  This is a 85 y.o. female who is s/p  Left femoral endarterectomy; left femoral to below knee popliteal bypass grafting with PTFE  -pt doing well in recovery with brisk left AT doppler signal.  There are faint left DP/PT/peroneal doppler signals present.  -to 4 east when bed available.  -gentle hydration with IVF at 50cc/hr given creatinine 2.0. -labs in am   [facility], PA-C 05/21/2020 2:30 PM [phone]

## 2020-05-21 NOTE — Op Note (Signed)
OPERATIVE REPORT  DATE OF SURGERY: 05/21/2020  PATIENT: Monique Cabrera, 85 y.o. female MRN: [id number]  DOB: January 15, 1935  PRE-OPERATIVE DIAGNOSIS: Critical limb ischemia left leg  POST-OPERATIVE DIAGNOSIS:  Same  PROCEDURE: Left femoral endarterectomy and left femoral to below-knee popliteal bypass with 6 mm propatent Gore-Tex graft  SURGEON:  Curt Jews, M.D.  PHYSICIAN ASSISTANT: Dr. Annamarie Major, Liana Crocker, PA-C  ANESTHESIA: General  EBL: per anesthesia record  Total I/O In: 1464.6 [P.O.:240; I.V.:1224.6] Out: 570 [Urine:370; Blood:200]  BLOOD ADMINISTERED: none  DRAINS: none  SPECIMEN: none  COUNTS CORRECT:  YES  PATIENT DISPOSITION:  PACU - hemodynamically stable  PROCEDURE DETAILS: Patient was taken operating placed where in supine position where the area of the left groin left leg was prepped and draped in usual sterile fashion.  Incision was made over the common femoral artery and carried down to isolate the common superficial femoral and profunda femoris arteries.  The patient had occlusion of the superficial femoral artery at its origin.  Patient had extensive coral reef plaque in the common femoral artery as seen in preoperative imaging.  The external iliac artery was encircled above the level of the inguinal ligament.  A separate incision was made over the medial approach to the below-knee popliteal artery.  SonoSite ultrasound had been used to visualize the vein the vein was of moderate size in the thigh but became branched and smaller in the mid thigh and became extremely small and multiple branches which were unusual for bypass from above the knee down to the popliteal space.  The below-knee popliteal artery was extensively calcified.  A tunnel was created from the below-knee popliteal artery to the groin.  The patient was given 6000 units of intravenous heparin.  After adequate circulation time the external iliac artery was occluded at the level of the  inguinal ligament.  The deep femoral artery was occluded with a baby Gregory clamp.  The common femoral artery was opened with an 11 blade and sent longstanding with Potts scissors.  There was extensive calcification.  The artery was endarterectomized from below the inguinal ligament down to the origin of the profundus femoris artery.  The plaque stopped at the origin of the profundus.  The superficial femoral artery was chronically occluded.  Atheromatous debris was removed from the endarterectomy plane.  The plaque was tacked around the origin of the deep femoral artery.  A 6 mm ringed propatent Gore-Tex graft was brought onto the field and was spatulated.  This was sewn as a functioning as a patch angioplasty and the outflow to the femoropopliteal.  This was sewn with a running 6-0 Prolene suture.  The anastomosis completed and clamps placed on the graft.  The graft was then placed to the prior created tunnel down to the level of the below-knee popliteal artery.  The web roll was placed around the mid thigh and a pneumatic tourniquet was placed on the mid thigh.  The leg was elevated elevated and exsanguinated with an Esmarch tourniquet.  The pneumatic tourniquet was inflated to 2 and 50 mmHg.  The below-knee popliteal artery was opened and was densely calcified but did have a lumen.  The artery was extend arteriotomy was extended with Potts scissors.  The graft was cut to the appropriate length and was sewn end-to-side to the artery with a running 6-0 Prolene suture.  Prior to completion of the closure the usual flushing maneuvers were undertaken.  Anastomosis completed and flow was restored.  The patient  had a good Doppler flow at the level of the anterior tibial artery.  The patient was given 50 mg of protamine to reverse heparin.  Wounds irrigated with saline.  Hemostasis left cautery.  Wounds were closed with 2-0 Vicryl in the fascia and the skin was closed with 3-0 and 4 subcuticular stitch.  Sterile  dressing was applied and the patient was transferred to the recovery room in stable condition   Rosetta Posner, M.D., [facility] 05/21/2020 6:44 PM

## 2020-05-21 NOTE — Discharge Instructions (Signed)
 [facility]  Discharge instructions  Lower Extremity Bypass Surgery  Please refer to the following instruction for your post-procedure care. Your surgeon or physician assistant will discuss any changes with you.  Activity  You are encouraged to walk as much as you can. You can slowly return to normal activities during the month after your surgery. Avoid strenuous activity and heavy lifting until your doctor tells you it's OK. Avoid activities such as vacuuming or swinging a golf club. Do not drive until your doctor give the OK and you are no longer taking prescription pain medications. It is also normal to have difficulty with sleep habits, eating and bowel movement after surgery. These will go away with time.  Bathing/Showering  Shower daily after you go home. Do not soak in a bathtub, hot tub, or swim until the incision heals completely.  Incision Care  Clean your incision with mild soap and water. Shower every day. Pat the area dry with a clean towel. You do not need a bandage unless otherwise instructed. Do not apply any ointments or creams to your incision. If you have open wounds you will be instructed how to care for them or a visiting nurse may be arranged for you. If you have staples or sutures along your incision they will be removed at your post-op appointment. You may have skin glue on your incision. Do not peel it off. It will come off on its own in about one week.  Wash the groin wound with soap and water daily and pat dry. (No tub bath-only shower)  Then put a dry gauze or washcloth in the groin to keep this area dry to help prevent wound infection.  Do this daily and as needed.  Do not use Vaseline or neosporin on your incisions.  Only use soap and water on your incisions and then protect and keep dry.  Diet  Resume your normal diet. There are no special food restrictions following this procedure. A low fat/ low cholesterol diet is  recommended for all patients with vascular disease. In order to heal from your surgery, it is CRITICAL to get adequate nutrition. Your body requires vitamins, minerals, and protein. Vegetables are the best source of vitamins and minerals. Vegetables also provide the perfect balance of protein. Processed food has little nutritional value, so try to avoid this.  Medications  Resume taking all your medications unless your doctor or physician assistant tells you not to. If your incision is causing pain, you may take over-the-counter pain relievers such as acetaminophen (Tylenol). If you were prescribed a stronger pain medication, please aware these medication can cause nausea and constipation. Prevent nausea by taking the medication with a snack or meal. Avoid constipation by drinking plenty of fluids and eating foods with high amount of fiber, such as fruits, vegetables, and grains. Take Colace 100 mg (an over-the-counter stool softener) twice a day as needed for constipation.  Do not take Tylenol if you are taking prescription pain medications.  Follow Up  Our office will schedule a follow up appointment 2-3 weeks following discharge.  Please call us immediately for any of the following conditions  Severe or worsening pain in your legs or feet while at rest or while walking Increase pain, redness, warmth, or drainage (pus) from your incision site(s) Fever of 101 degree or higher The swelling in your leg with the bypass suddenly worsens and becomes more painful than when you were in the hospital If you have   been instructed to feel your graft pulse then you should do so every day. If you can no longer feel this pulse, call the office immediately. Not all patients are given this instruction.  Leg swelling is common after leg bypass surgery.  The swelling should improve over a few months following surgery. To improve the swelling, you may elevate your legs above the level of your heart while you are  sitting or resting. Your surgeon or physician assistant may ask you to apply an ACE wrap or wear compression (TED) stockings to help to reduce swelling.  Reduce your risk of vascular disease  Stop smoking. If you would like help call [facility] at 1-800-QUIT-NOW (1-800-784-8669) or [place] at 336-586-4000.  Manage your cholesterol Maintain a desired weight Control your diabetes weight Control your diabetes Keep your blood pressure down  If you have any questions, please call the office at [phone]  

## 2020-05-21 NOTE — Anesthesia Procedure Notes (Signed)
Arterial Line Insertion Start/End5/26/2021 10:45 AM, 05/21/2020 10:50 AM Performed by: Roberts Gaudy, MD, anesthesiologist  Patient location: OR. Preanesthetic checklist: patient identified, IV checked, site marked, risks and benefits discussed, surgical consent, monitors and equipment checked, pre-op evaluation and timeout performed Left, brachial was placed Catheter size: 20 G  Attempts: 1 Procedure performed using ultrasound guided technique. Ultrasound Notes:anatomy identified, needle tip was noted to be adjacent to the nerve/plexus identified and no ultrasound evidence of intravascular and/or intraneural injection Following insertion, Biopatch and dressing applied. Patient tolerated the procedure well with no immediate complications.

## 2020-05-21 NOTE — Transfer of Care (Signed)
Immediate Anesthesia Transfer of Care Note  Patient: Monique Cabrera  Procedure(s) Performed: LEFT COMMON FEMORAL ENDARTERECTOMY (Left ) LEFT FEMORAL TO BELOW KNEE POPLITEAL ARTERY BYPASS GRAFT (Left )  Patient Location: PACU  Anesthesia Type:General  Level of Consciousness: awake and drowsy  Airway & Oxygen Therapy: Patient Spontanous Breathing  Post-op Assessment: Report given to RN, Post -op Vital signs reviewed and stable and Patient moving all extremities X 4  Post vital signs: Reviewed and stable  Last Vitals:  Vitals Value Taken Time  BP 153/59 05/21/20 1401  Temp    Pulse 58 05/21/20 1404  Resp 14 05/21/20 1404  SpO2 99 % 05/21/20 1404  Vitals shown include unvalidated device data.  Last Pain:  Vitals:   05/21/20 0844  TempSrc:   PainSc: 0-No pain      Patients Stated Pain Goal: 3 (29/09/03 0149)  Complications: No apparent anesthesia complications

## 2020-05-21 NOTE — Anesthesia Procedure Notes (Addendum)
Procedure Name: Intubation Date/Time: 05/21/2020 10:53 AM Performed by: Harden Mo, CRNA Pre-anesthesia Checklist: Patient identified, Emergency Drugs available, Suction available and Patient being monitored Patient Re-evaluated:Patient Re-evaluated prior to induction Oxygen Delivery Method: Circle system utilized Preoxygenation: Pre-oxygenation with 100% oxygen Induction Type: IV induction Ventilation: Oral airway inserted - appropriate to patient size and Mask ventilation without difficulty Laryngoscope Size: Mac and 3 Grade View: Grade I Tube type: Oral Tube size: 7.0 mm Number of attempts: 1 Airway Equipment and Method: Stylet Placement Confirmation: ETT inserted through vocal cords under direct vision,  CO2 detector,  positive ETCO2 and breath sounds checked- equal and bilateral Secured at: 22 cm Tube secured with: Tape Comments: Performed by Sena Slate, SRNA

## 2020-05-21 NOTE — Anesthesia Preprocedure Evaluation (Addendum)
Anesthesia Evaluation  Patient identified by MRN, date of birth, ID band Patient awake    Reviewed: Allergy & Precautions, NPO status , Patient's Chart, lab work & pertinent test results  Airway Mallampati: II  TM Distance: >3 FB Neck ROM: Full    Dental  (+) Dental Advisory Given   Pulmonary former smoker,    breath sounds clear to auscultation       Cardiovascular hypertension,  Rhythm:Regular Rate:Normal     Neuro/Psych    GI/Hepatic   Endo/Other  diabetes  Renal/GU      Musculoskeletal   Abdominal   Peds  Hematology   Anesthesia Other Findings   Reproductive/Obstetrics                            Anesthesia Physical Anesthesia Plan  ASA: III  Anesthesia Plan: General   Post-op Pain Management:    Induction: Intravenous  PONV Risk Score and Plan: Ondansetron  Airway Management Planned: Oral ETT  Additional Equipment: Arterial line  Intra-op Plan:   Post-operative Plan:   Informed Consent: I have reviewed the patients History and Physical, chart, labs and discussed the procedure including the risks, benefits and alternatives for the proposed anesthesia with the patient or authorized representative who has indicated his/her understanding and acceptance.       Plan Discussed with: CRNA and Anesthesiologist  Anesthesia Plan Comments:         Anesthesia Quick Evaluation

## 2020-05-22 ENCOUNTER — Encounter: Payer: Self-pay | Admitting: *Deleted

## 2020-05-22 ENCOUNTER — Encounter (HOSPITAL_COMMUNITY): Payer: Medicare Other

## 2020-05-22 LAB — CBC
HCT: 26.4 % — ABNORMAL LOW (ref 36.0–46.0)
HCT: 29.2 % — ABNORMAL LOW (ref 36.0–46.0)
Hemoglobin: 9 g/dL — ABNORMAL LOW (ref 12.0–15.0)
Hemoglobin: 9.7 g/dL — ABNORMAL LOW (ref 12.0–15.0)
MCH: 32.3 pg (ref 26.0–34.0)
MCH: 31.8 pg (ref 26.0–34.0)
MCHC: 34.1 g/dL (ref 30.0–36.0)
MCHC: 33.2 g/dL (ref 30.0–36.0)
MCV: 94.6 fL (ref 80.0–100.0)
MCV: 95.7 fL (ref 80.0–100.0)
Platelets: 210 10*3/uL (ref 150–400)
Platelets: 223 10*3/uL (ref 150–400)
RBC: 2.79 MIL/uL — ABNORMAL LOW (ref 3.87–5.11)
RBC: 3.05 MIL/uL — ABNORMAL LOW (ref 3.87–5.11)
RDW: 11.5 % (ref 11.5–15.5)
RDW: 11.9 % (ref 11.5–15.5)
WBC: 11.4 10*3/uL — ABNORMAL HIGH (ref 4.0–10.5)
WBC: 13 10*3/uL — ABNORMAL HIGH (ref 4.0–10.5)
nRBC: 0 % (ref 0.0–0.2)
nRBC: 0 % (ref 0.0–0.2)

## 2020-05-22 LAB — BASIC METABOLIC PANEL
Anion gap: 11 (ref 5–15)
BUN: 47 mg/dL — ABNORMAL HIGH (ref 8–23)
CO2: 21 mmol/L — ABNORMAL LOW (ref 22–32)
Calcium: 9.3 mg/dL (ref 8.9–10.3)
Chloride: 103 mmol/L (ref 98–111)
Creatinine, Ser: 2.29 mg/dL — ABNORMAL HIGH (ref 0.44–1.00)
GFR calc Af Amer: 22 mL/min — ABNORMAL LOW (ref >60)
GFR calc non Af Amer: 19 mL/min — ABNORMAL LOW (ref >60)
Glucose, Bld: 243 mg/dL — ABNORMAL HIGH (ref 70–99)
Potassium: 4.6 mmol/L (ref 3.5–5.1)
Sodium: 135 mmol/L (ref 135–145)

## 2020-05-22 LAB — GLUCOSE, CAPILLARY
Glucose-Capillary: 282 mg/dL — ABNORMAL HIGH (ref 70–99)
Glucose-Capillary: 210 mg/dL — ABNORMAL HIGH (ref 70–99)
Glucose-Capillary: 86 mg/dL (ref 70–99)
Glucose-Capillary: 140 mg/dL — ABNORMAL HIGH (ref 70–99)

## 2020-05-22 MED ORDER — CHLORHEXIDINE GLUCONATE CLOTH 2 % EX PADS
6.0000 | MEDICATED_PAD | Freq: Every day | CUTANEOUS | Status: DC
  Administered 2020-05-22: 6 via TOPICAL

## 2020-05-22 MED ORDER — CHLORHEXIDINE GLUCONATE CLOTH 2 % EX PADS
6.0000 | MEDICATED_PAD | Freq: Every day | CUTANEOUS | Status: DC
  Administered 2020-05-23: 6 via TOPICAL

## 2020-05-22 NOTE — Evaluation (Signed)
Occupational Therapy Evaluation Patient Details Name: Monique Cabrera MRN: [id number] DOB: Oct 26, 1935 Today's Date: 05/22/2020    History of Present Illness 85 y.o female s/p Left femoral endarterectomy; left femoral to below knee popliteal bypass grafting with PTFE. PMH includes R BKA with prosthesis, Right TTA, HTN, DM, CVA, PAD, HLD, CKD st2, COVID+  R sided todds paresis.   Clinical Impression   PTA pt living with daughter, mod I for BADL/IADL and working in outpatient PT on walking with RLE prosthesis. At time of eval, pt completing bed mobility at supervision levl and sit <> stands with min A and RW. Pt able to don RLE prosthesis with min A (held it straight while pt donned) and L sock with set up assist. Pt completed functional mobility to the chair with min guard for safety and RW. Pt has all needed DME at this time. No post acute OT follow up anticipated, but will continue to follow while acute to progress BADLs per POC listed below.    Follow Up Recommendations  No OT follow up;Supervision - Intermittent    Equipment Recommendations  None recommended by OT    Recommendations for Other Services       Precautions / Restrictions Precautions Precautions: Fall Precaution Comments: old R BKA with prosthetic Restrictions Weight Bearing Restrictions: No      Mobility Bed Mobility Overal bed mobility: Needs Assistance Bed Mobility: Supine to Sit     Supine to sit: Supervision        Transfers Overall transfer level: Needs assistance Equipment used: Rolling walker (2 wheeled) Transfers: Sit to/from [facility] Sit to Stand: Min assist Stand pivot transfers: Min guard       General transfer comment: min A to rise and steady from low surface; min guard for safety, cues for safe hand placement    Balance Overall balance assessment: Needs assistance Sitting-balance support: Feet supported Sitting balance-Leahy Scale: Good     Standing balance  support: Bilateral upper extremity supported Standing balance-Leahy Scale: Poor Standing balance comment: reliant on external support of RW                           ADL either performed or assessed with clinical judgement   ADL Overall ADL's : Needs assistance/impaired Eating/Feeding: Set up;Sitting Eating/Feeding Details (indicate cue type and reason): up in chair for breakfast Grooming: Set up;Sitting   Upper Body Bathing: Set up;Sitting   Lower Body Bathing: Set up;Sit to/from stand;Sitting/lateral leans   Upper Body Dressing : Set up;Sitting   Lower Body Dressing: Set up;Sitting/lateral leans;Sit to/from stand Lower Body Dressing Details (indicate cue type and reason): donned RLE prosthetic with set up and L sock and shoe with set up assist Toilet Transfer: Min guard;Stand-pivot;RW   Toileting- Clothing Manipulation and Hygiene: Set up;Sitting/lateral lean   Tub/ Shower Transfer: Minimal assistance;Ambulation;Shower seat;Rolling walker   Functional mobility during ADLs: Min guard;Rolling walker       Vision Patient Visual Report: No change from baseline       Perception     Praxis      Pertinent Vitals/Pain Pain Assessment: Faces Faces Pain Scale: Hurts a little bit Pain Location: L leg Pain Descriptors / Indicators: Aching Pain Intervention(s): Monitored during session     Hand Dominance     Extremity/Trunk Assessment Upper Extremity Assessment Upper Extremity Assessment: Overall WFL for tasks assessed   Lower Extremity Assessment Lower Extremity Assessment: Defer to PT evaluation  Communication Communication Communication: No difficulties   Cognition Arousal/Alertness: Awake/alert Behavior During Therapy: WFL for tasks assessed/performed Overall Cognitive Status: Within Functional Limits for tasks assessed                                 General Comments: some mild word finding difficulty, but functional for  assessment   General Comments       Exercises     Shoulder Instructions      Home Living Family/patient expects to be discharged to:: Private residence Living Arrangements: Children(daughter) Available Help at Discharge: Family;Available 24 hours/day Type of Home: House Home Access: Level entry(small threshold)     Home Layout: One level     Bathroom Shower/Tub: Occupational psychologist: Standard     Home Equipment: Clinical cytogeneticist - 2 wheels;Cane - single point;Wheelchair - manual;Bedside commode   Additional Comments: R prosthesis      Prior Functioning/Environment Level of Independence: Independent        Comments: as needed assist for IADL, lives with daughter        OT Problem List: Decreased knowledge of use of DME or AE;Decreased activity tolerance;Impaired balance (sitting and/or standing);Pain      OT Treatment/Interventions: Self-care/ADL training;Therapeutic exercise;Patient/family education;Balance training;Energy conservation;Therapeutic activities;DME and/or AE instruction    OT Goals(Current goals can be found in the care plan section) Acute Rehab OT Goals Patient Stated Goal: return to outpatient PT to work on walking OT Goal Formulation: With patient Time For Goal Achievement: 06/05/20 Potential to Achieve Goals: Good  OT Frequency: Min 2X/week   Barriers to D/C:            Co-evaluation              AM-PAC OT "6 Clicks" Daily Activity     Outcome Measure Help from another person eating meals?: None Help from another person taking care of personal grooming?: A Little Help from another person toileting, which includes using toliet, bedpan, or urinal?: A Little Help from another person bathing (including washing, rinsing, drying)?: A Little Help from another person to put on and taking off regular upper body clothing?: None Help from another person to put on and taking off regular lower body clothing?: A Little 6 Click  Score: 20   End of Session Equipment Utilized During Treatment: Gait belt;Rolling walker Nurse Communication: Mobility status  Activity Tolerance: Patient tolerated treatment well Patient left: in chair;with call bell/phone within reach  OT Visit Diagnosis: Unsteadiness on feet (R26.81);Other abnormalities of gait and mobility (R26.89);Pain Pain - Right/Left: Left Pain - part of body: Leg                Time: 2751-7001 OT Time Calculation (min): 37 min Charges:  OT General Charges $OT Visit: 1 Visit OT Evaluation $OT Eval Moderate Complexity: 1 Mod OT Treatments $Self Care/Home Management : 8-22 mins  Zenovia Jarred, MSOT, OTR/L Acute Rehabilitation Services [facility] Office Number: [phone] Pager: [phone]  Zenovia Jarred 05/22/2020, 9:55 AM

## 2020-05-22 NOTE — Plan of Care (Signed)
Continue to monitor

## 2020-05-22 NOTE — Progress Notes (Addendum)
  Progress Note    05/22/2020 7:16 AM 1 Day Post-Op  Subjective:  Says her foot feels better this morning.  Afebrile HR 60's-70's NSR 622'W-979'G systolic 921% RA  Vitals:   05/21/20 2351 05/22/20 0403  BP: (!) 136/58 (!) 143/60  Pulse: 64 65  Resp: 17 16  Temp: 98.5 F (36.9 C) 98 F (36.7 C)  SpO2: 100% 100%    Physical Exam: Cardiac:  regular Lungs:  Non labored Incisions:  Left groin and left BK incisions look good Extremities:  Brisk doppler signal left AT/peroneal and faint DP and PT   CBC    Component Value Date/Time   WBC 11.4 (H) 05/22/2020 0248   RBC 2.79 (L) 05/22/2020 0248   HGB 9.0 (L) 05/22/2020 0248   HGB 8.6 (L) 10/22/2019 1056   HCT 26.4 (L) 05/22/2020 0248   HCT 26.7 (L) 10/22/2019 1056   PLT 210 05/22/2020 0248   PLT 508 (H) 10/22/2019 1056   MCV 94.6 05/22/2020 0248   MCV 91 10/22/2019 1056   MCH 32.3 05/22/2020 0248   MCHC 34.1 05/22/2020 0248   RDW 11.5 05/22/2020 0248   RDW 12.0 10/22/2019 1056   LYMPHSABS 2,456 03/06/2020 1603   LYMPHSABS 1.6 10/22/2019 1056   MONOABS 0.6 02/13/2020 0504   EOSABS 400 03/06/2020 1603   EOSABS 0.1 10/22/2019 1056   BASOSABS 64 03/06/2020 1603   BASOSABS 0.1 10/22/2019 1056    BMET    Component Value Date/Time   NA 135 05/22/2020 0248   NA 128 (L) 10/22/2019 1056   K 4.6 05/22/2020 0248   CL 103 05/22/2020 0248   CO2 21 (L) 05/22/2020 0248   GLUCOSE 243 (H) 05/22/2020 0248   BUN 47 (H) 05/22/2020 0248   BUN 17 10/22/2019 1056   CREATININE 2.29 (H) 05/22/2020 0248   CREATININE 1.49 (H) 03/06/2020 1603   CALCIUM 9.3 05/22/2020 0248   GFRNONAA 19 (L) 05/22/2020 0248   GFRNONAA 44 (L) 11/07/2019 1439   GFRAA 22 (L) 05/22/2020 0248   GFRAA 51 (L) 11/07/2019 1439    INR    Component Value Date/Time   INR 0.9 05/21/2020 0948     Intake/Output Summary (Last 24 hours) at 05/22/2020 0716 Last data filed at 05/22/2020 0500 Gross per 24 hour  Intake 2326.21 ml  Output 1145 ml  Net  1181.21 ml     Assessment:  85 y.o. female is s/p:  Left femoral endarterectomy and left femoral to below-knee popliteal bypass with 6 mm propatent Gore-Tex graft  1 Day Post-Op  Plan: -pt doing well this am with brisk doppler signal left AT/peroneal and faint DP and PT and pain in left foot improved. -her creatinine is 2.29 this am, which is up from 2.  Her ARB was not restarted post op.  Continue IVF, which are at 50cc/hr.  Check labs tomorrow -mobilize today-she has her right leg prosthesis in the room.   -DVT prophylaxis:  Sq heparin   Leontine Locket, PA-C [facility] [phone] 05/22/2020 7:16 AM   I have examined the patient, reviewed and agree with above.  Some nausea this morning.  Feels she needs to have a bowel movement.  Foot well perfused.  We will continue to mobilize and discharge to home when comfortable walking  Curt Jews, MD 05/22/2020 10:28 AM

## 2020-05-22 NOTE — Progress Notes (Signed)
Nurse,  Irish Lack was about to remove pt's foley, but the pt said that Dr. Luther Parody assistant said to leave foley in. Day RN will follow up.

## 2020-05-22 NOTE — Progress Notes (Signed)
Patient stated that PA wanted her foley catheter left in at this time. Leontine Locket, PA notified to follow up and she wants foley left in for now.   Emelda Fear, RN

## 2020-05-22 NOTE — Progress Notes (Signed)
Mobility Specialist: Progress Note   05/22/20 1616  Mobility  Activity Transferred:  Chair to bed  Level of Assistance Minimal assist, patient does 75% or more  Assistive Device Front wheel walker  Mobility Response Tolerated fair  Mobility performed by Mobility specialist  $Mobility charge 1 Mobility   Pt transferred from chair to bed. Pt c/o pain in her L foot. The nurse was notified. Pt positioned in bed with call bell and phone within reach.   [facility] Monique Cabrera Mobility Specialist

## 2020-05-22 NOTE — Evaluation (Signed)
Physical Therapy Evaluation Patient Details Name: Monique Cabrera MRN: [id number] DOB: 1935/02/16 Today's Date: 05/22/2020   History of Present Illness  85 y.o female s/p Left femoral endarterectomy; left femoral to below knee popliteal bypass grafting with PTFE. PMH includes R BKA with prosthesis, Right TTA, HTN, DM, CVA, PAD, HLD, CKD st2, COVID+  R sided todds paresis.  Clinical Impression  Pt admitted with above diagnosis and presents to PT with functional limitations due to deficits listed below (See PT problem list). Pt needs skilled PT to maximize independence and safety to allow discharge to home with family and resume her 107.      Follow Up Recommendations Outpatient PT(resume OPPT for continued prosthetic training)    Equipment Recommendations  None recommended by PT    Recommendations for Other Services       Precautions / Restrictions Precautions Precautions: Fall Precaution Comments: old R BKA with prosthetic Restrictions Weight Bearing Restrictions: No      Mobility  Bed Mobility Overal bed mobility: Needs Assistance Bed Mobility: Supine to Sit     Supine to sit: Supervision     General bed mobility comments: Pt up in chair  Transfers Overall transfer level: Needs assistance Equipment used: Rolling walker (2 wheeled) Transfers: Sit to/from [facility] Sit to Stand: Min guard Stand pivot transfers: Min guard       General transfer comment: Assist for safety and lines  Ambulation/Gait Ambulation/Gait assistance: Min guard Gait Distance (Feet): 300 Feet Assistive device: Rolling walker (2 wheeled) Gait Pattern/deviations: Step-through pattern;Decreased step length - right;Decreased step length - left;Wide base of support Gait velocity: decr Gait velocity interpretation: 1.31 - 2.62 ft/sec, indicative of limited community ambulator General Gait Details: Assist for lines and safety. Verbal cues to keep feet closer to the walker when  she turns.   Stairs            Wheelchair Mobility    Modified Rankin (Stroke Patients Only)       Balance Overall balance assessment: Needs assistance Sitting-balance support: Feet supported Sitting balance-Leahy Scale: Good     Standing balance support: Bilateral upper extremity supported Standing balance-Leahy Scale: Poor Standing balance comment: walker and supervision for static standing                             Pertinent Vitals/Pain Pain Assessment: Faces Faces Pain Scale: Hurts a little bit Pain Location: L leg Pain Descriptors / Indicators: Sore Pain Intervention(s): Monitored during session    Home Living Family/patient expects to be discharged to:: Private residence Living Arrangements: Children(daughter) Available Help at Discharge: Family;Available 24 hours/day Type of Home: House Home Access: Level entry(small threshold)     Home Layout: One level Home Equipment: Clinical cytogeneticist - 2 wheels;Cane - single point;Wheelchair - manual;Bedside commode Additional Comments: R prosthesis    Prior Function Level of Independence: Independent with assistive device(s)         Comments: as needed assist for IADL, lives with daughter. Uses rolling walker for amb     Hand Dominance   Dominant Hand: Right    Extremity/Trunk Assessment   Upper Extremity Assessment Upper Extremity Assessment: Defer to OT evaluation    Lower Extremity Assessment Lower Extremity Assessment: RLE deficits/detail;Generalized weakness RLE Deficits / Details: BKA       Communication   Communication: No difficulties  Cognition Arousal/Alertness: Awake/alert Behavior During Therapy: WFL for tasks assessed/performed Overall Cognitive Status: Within Functional Limits for  tasks assessed                                 General Comments: some mild word finding difficulty, but functional for assessment      General Comments General comments  (skin integrity, edema, etc.): VSS. On RA with SpO2 100% at rest and with activity. BP after amb 135/68    Exercises     Assessment/Plan    PT Assessment Patient needs continued PT services  PT Problem List Decreased balance;Decreased mobility       PT Treatment Interventions DME instruction;Gait training;Functional mobility training;Therapeutic activities;Therapeutic exercise;Balance training;Patient/family education    PT Goals (Current goals can be found in the Care Plan section)  Acute Rehab PT Goals Patient Stated Goal: return to outpatient PT to work on walking PT Goal Formulation: With patient Time For Goal Achievement: 05/29/20 Potential to Achieve Goals: Good    Frequency Min 3X/week   Barriers to discharge        Co-evaluation               AM-PAC PT "6 Clicks" Mobility  Outcome Measure Help needed turning from your back to your side while in a flat bed without using bedrails?: None Help needed moving from lying on your back to sitting on the side of a flat bed without using bedrails?: None Help needed moving to and from a bed to a chair (including a wheelchair)?: A Little Help needed standing up from a chair using your arms (e.g., wheelchair or bedside chair)?: A Little Help needed to walk in hospital room?: A Little Help needed climbing 3-5 steps with a railing? : A Little 6 Click Score: 20    End of Session Equipment Utilized During Treatment: Gait belt Activity Tolerance: Patient tolerated treatment well;No increased pain Patient left: in chair;with call bell/phone within reach Nurse Communication: Mobility status;Other (comment)(pt c/o nausea) PT Visit Diagnosis: Other abnormalities of gait and mobility (R26.89)    Time: 8416-6063 PT Time Calculation (min) (ACUTE ONLY): 24 min   Charges:   PT Evaluation $PT Eval Moderate Complexity: 1 Mod PT Treatments $Gait Training: 8-22 mins        [place] Pager  [phone] Office [place] 05/22/2020, 10:23 AM

## 2020-05-22 NOTE — Progress Notes (Signed)
Mobility Specialist - Progress Note   05/22/20 1414  Mobility  Activity Ambulated in hall  Level of Assistance Modified independent, requires aide device or extra time  Assistive Device Front wheel walker  Distance Ambulated (ft) 510 ft  Mobility Response Tolerated well  Mobility performed by Mobility specialist  $Mobility charge 1 Mobility    Pre-mobility: 68 HR, 124/77 BP, 100% SpO2 Post-mobility: 77 HR, 150/73 BP, 100% SPO2  Pt said the last time she ambulated, she had dizziness and nausea pos-mobility. She endorsed being asx after this session. Pt leaned forward on her RW while walking. She was left sitting in her chair with her call bell and phone within reach.   [place] Specialist

## 2020-05-23 ENCOUNTER — Telehealth: Payer: Self-pay | Admitting: *Deleted

## 2020-05-23 ENCOUNTER — Ambulatory Visit: Payer: Medicare Other | Admitting: Physical Therapy

## 2020-05-23 ENCOUNTER — Encounter (HOSPITAL_COMMUNITY): Payer: Medicare Other

## 2020-05-23 LAB — BASIC METABOLIC PANEL
Anion gap: 7 (ref 5–15)
BUN: 45 mg/dL — ABNORMAL HIGH (ref 8–23)
CO2: 21 mmol/L — ABNORMAL LOW (ref 22–32)
Calcium: 9 mg/dL (ref 8.9–10.3)
Chloride: 104 mmol/L (ref 98–111)
Creatinine, Ser: 2.07 mg/dL — ABNORMAL HIGH (ref 0.44–1.00)
GFR calc Af Amer: 25 mL/min — ABNORMAL LOW (ref >60)
GFR calc non Af Amer: 22 mL/min — ABNORMAL LOW (ref >60)
Glucose, Bld: 199 mg/dL — ABNORMAL HIGH (ref 70–99)
Potassium: 4.5 mmol/L (ref 3.5–5.1)
Sodium: 132 mmol/L — ABNORMAL LOW (ref 135–145)

## 2020-05-23 LAB — GLUCOSE, CAPILLARY
Glucose-Capillary: 153 mg/dL — ABNORMAL HIGH (ref 70–99)
Glucose-Capillary: 177 mg/dL — ABNORMAL HIGH (ref 70–99)

## 2020-05-23 MED ORDER — HYDROCODONE-ACETAMINOPHEN 5-325 MG PO TABS: 1.0000 | ORAL_TABLET | Freq: Four times a day (QID) | ORAL | 0 refills | Status: DC | PRN

## 2020-05-23 NOTE — Telephone Encounter (Signed)
Transition Care Management Follow-up Telephone Call  Date of discharge and from where: 05/23/2020 [place]  How have you been since you were released from the hospital? better  Any questions or concerns? No   Items Reviewed:  Did the pt receive and understand the discharge instructions provided? Yes   Medications obtained and verified? Yes   Any new allergies since your discharge? No   Dietary orders reviewed? Yes  Do you have support at home? Yes  Daughter  Other (ie: DME, Home Health, etc) Home Health  Functional Questionnaire: (I = Independent and D = Dependent) ADL's: I with assistance wheelchair  Bathing/Dressing- I with assistance   Meal Prep- D  Eating- I  Maintaining continence- I  Transferring/Ambulation- I with assistance Wheelchair  Managing Meds- I    Follow up appointments reviewed:    PCP Hospital f/u appt confirmed? Yes  Scheduled to see [name] on 05/29/20.  [facility] f/u appt confirmed? No    Are transportation arrangements needed? No   If their condition worsens, is the pt aware to call  their PCP or go to the ED? Yes  Was the patient provided with contact information for the PCP's office or ED? Yes  Was the pt encouraged to call back with questions or concerns? Yes

## 2020-05-23 NOTE — Progress Notes (Signed)
Progress Note    05/23/2020 8:33 AM 2 Days Post-Op  Subjective: Postoperative pain well controlled.  She ambulated yesterday using her prosthesis.  Foley remains in place   Vitals:   05/22/20 2353 05/23/20 0458  BP: (!) 136/56 (!) 151/62  Pulse: 71 74  Resp: 13 18  Temp: 98.3 F (36.8 C) 99.6 F (37.6 C)  SpO2: 100% 99%    Physical Exam: Cardiac: Rate and rhythm are regular Lungs: Clear to auscultation bilaterally Incisions: Left groin incision and left lower leg incisions are both well approximated.  No signs of infection.  No oozing. Extremities: Left foot is warm and well-perfused.  She has Doppler signals of PT, DP and peroneal arteries.  Skin is intact.  Normal motor function and normal sensation.   CBC    Component Value Date/Time   WBC 13.0 (H) 05/22/2020 1033   RBC 3.05 (L) 05/22/2020 1033   HGB 9.7 (L) 05/22/2020 1033   HGB 8.6 (L) 10/22/2019 1056   HCT 29.2 (L) 05/22/2020 1033   HCT 26.7 (L) 10/22/2019 1056   PLT 223 05/22/2020 1033   PLT 508 (H) 10/22/2019 1056   MCV 95.7 05/22/2020 1033   MCV 91 10/22/2019 1056   MCH 31.8 05/22/2020 1033   MCHC 33.2 05/22/2020 1033   RDW 11.9 05/22/2020 1033   RDW 12.0 10/22/2019 1056   LYMPHSABS 2,456 03/06/2020 1603   LYMPHSABS 1.6 10/22/2019 1056   MONOABS 0.6 02/13/2020 0504   EOSABS 400 03/06/2020 1603   EOSABS 0.1 10/22/2019 1056   BASOSABS 64 03/06/2020 1603   BASOSABS 0.1 10/22/2019 1056    BMET    Component Value Date/Time   NA 132 (L) 05/23/2020 0245   NA 128 (L) 10/22/2019 1056   K 4.5 05/23/2020 0245   CL 104 05/23/2020 0245   CO2 21 (L) 05/23/2020 0245   GLUCOSE 199 (H) 05/23/2020 0245   BUN 45 (H) 05/23/2020 0245   BUN 17 10/22/2019 1056   CREATININE 2.07 (H) 05/23/2020 0245   CREATININE 1.49 (H) 03/06/2020 1603   CALCIUM 9.0 05/23/2020 0245   GFRNONAA 22 (L) 05/23/2020 0245   GFRNONAA 44 (L) 11/07/2019 1439   GFRAA 25 (L) 05/23/2020 0245   GFRAA 51 (L) 11/07/2019 1439      Intake/Output Summary (Last 24 hours) at 05/23/2020 0833 Last data filed at 05/23/2020 0700 Gross per 24 hour  Intake 1924.88 ml  Output 1600 ml  Net 324.88 ml    HOSPITAL MEDICATIONS Scheduled Meds: . amLODipine  10 mg Oral Daily  . aspirin EC  81 mg Oral Daily  . atorvastatin  20 mg Oral Daily  . carvedilol  25 mg Oral BID WC  . Chlorhexidine Gluconate Cloth  6 each Topical Daily  . clopidogrel  75 mg Oral Daily  . docusate sodium  100 mg Oral Daily  . feeding supplement (GLUCERNA SHAKE)  237 mL Oral TID BM  . heparin  5,000 Units Subcutaneous Q8H  . insulin aspart  0-15 Units Subcutaneous TID AC & HS  . insulin aspart  3 Units Subcutaneous TID PC  . insulin glargine  20 Units Subcutaneous Daily  . multivitamin with minerals  1 tablet Oral Daily  . pantoprazole  40 mg Oral Daily   Continuous Infusions: . sodium chloride    . sodium chloride 50 mL/hr at 05/23/20 0700  . magnesium sulfate bolus IVPB     PRN Meds:.sodium chloride, acetaminophen **OR** acetaminophen, bisacodyl, guaiFENesin-dextromethorphan, hydrALAZINE, HYDROmorphone (DILAUDID) injection, labetalol, magnesium sulfate bolus  IVPB, metoprolol tartrate, ondansetron, oxyCODONE-acetaminophen, phenol, polyethylene glycol  Assessment: Left femoral endarterectomy and left femoral to below-knee popliteal bypass with 6 mm propatent Gore-Tex graft.  Signs are stable and she is afebrile.  Her creatinine has fallen to her baseline.  Good urine output.  Stable from vascular surgery standpoint    Plan: -Discontinue her Foley catheter today and if able to void spontaneously plan discharge home with outpatient physical therapy. -DVT prophylaxis: Heparin subcutaneous   Risa Grill, PA-C [facility] [phone] 05/23/2020  8:33 AM

## 2020-05-23 NOTE — Progress Notes (Signed)
Mobility Specialist - Progress Note   05/23/20 1457  Mobility  Activity  (Cancellation)  Mobility performed by Mobility specialist  $Mobility charge 1 Mobility   Will not see pt today due to PT visit in afternoon and pt preparing for d/c.  [place] Specialist

## 2020-05-23 NOTE — Progress Notes (Signed)
Physical Therapy Treatment Patient Details Name: Monique Cabrera MRN: [id number] DOB: [birth year] Today's Date: 05/23/2020    History of Present Illness 85 y.o female s/p Left femoral endarterectomy; left femoral to below knee popliteal bypass grafting with PTFE. PMH includes R BKA with prosthesis, Right TTA, HTN, DM, CVA, PAD, HLD, CKD st2, COVID+  R sided todds paresis.    PT Comments    Pt doing well with mobility. Pt eager for dc home and return to OPPT.    Follow Up Recommendations  Outpatient PT(resume OPPT for continued prosthetic training)     Equipment Recommendations  None recommended by PT    Recommendations for Other Services       Precautions / Restrictions Precautions Precautions: Fall Precaution Comments: old R BKA with prosthetic Restrictions Weight Bearing Restrictions: No    Mobility  Bed Mobility Overal bed mobility: Modified Independent       Supine to sit: Modified independent (Device/Increase time)        Transfers Overall transfer level: Needs assistance Equipment used: Rolling walker (2 wheeled) Transfers: Sit to/from [facility] Sit to Stand: Min assist         General transfer comment: Assist for balance  Ambulation/Gait Ambulation/Gait assistance: Min guard Gait Distance (Feet): 200 Feet Assistive device: Rolling walker (2 wheeled) Gait Pattern/deviations: Step-through pattern;Decreased step length - right;Decreased step length - left;Wide base of support Gait velocity: decr Gait velocity interpretation: 1.31 - 2.62 ft/sec, indicative of limited community ambulator General Gait Details: Assist for safety.   Stairs             Wheelchair Mobility    Modified Rankin (Stroke Patients Only)       Balance Overall balance assessment: Needs assistance Sitting-balance support: Feet supported Sitting balance-Leahy Scale: Good     Standing balance support: Bilateral upper extremity supported Standing  balance-Leahy Scale: Poor Standing balance comment: walker and supervision for static standing                            Cognition Arousal/Alertness: Awake/alert Behavior During Therapy: WFL for tasks assessed/performed Overall Cognitive Status: Within Functional Limits for tasks assessed                                        Exercises      General Comments        Pertinent Vitals/Pain Pain Assessment: Faces Faces Pain Scale: Hurts little more Pain Location: L leg Pain Descriptors / Indicators: Sore Pain Intervention(s): Monitored during session;Repositioned    Home Living                      Prior Function            PT Goals (current goals can now be found in the care plan section) Acute Rehab PT Goals Patient Stated Goal: return to outpatient PT to work on walking Progress towards PT goals: Progressing toward goals    Frequency    Min 3X/week      PT Plan Current plan remains appropriate    Co-evaluation              AM-PAC PT "6 Clicks" Mobility   Outcome Measure  Help needed turning from your back to your side while in a flat bed without using bedrails?: None Help needed moving from lying  on your back to sitting on the side of a flat bed without using bedrails?: None Help needed moving to and from a bed to a chair (including a wheelchair)?: A Little Help needed standing up from a chair using your arms (e.g., wheelchair or bedside chair)?: A Little Help needed to walk in hospital room?: A Little Help needed climbing 3-5 steps with a railing? : A Little 6 Click Score: 20    End of Session Equipment Utilized During Treatment: Gait belt Activity Tolerance: Patient tolerated treatment well Patient left: in chair;with call bell/phone within reach Nurse Communication: Mobility status PT Visit Diagnosis: Other abnormalities of gait and mobility (R26.89)     Time: 0712-1975 PT Time Calculation (min) (ACUTE  ONLY): 22 min  Charges:  $Gait Training: 8-22 mins                     [place] Pager [phone] Office [place] 05/23/2020, 2:30 PM

## 2020-05-23 NOTE — Progress Notes (Signed)
05/23/2020 3:15 PM Discharge AVS meds taken today and those due this evening reviewed.  Follow-up appointments and when to call md reviewed.  D/C IV and TELE.  Questions and concerns addressed.   D/C home per orders. [place]

## 2020-05-23 NOTE — Discharge Summary (Signed)
[facility] Discharge Summary Patient ID: Monique Cabrera [id number] 85 y.o. [birth year]  Admit date: 05/21/2020  Discharge date and time: 05/23/2020  Admitting Physician: Rosetta Posner, MD   Discharge Physician: Rosetta Posner, MD  Admission Diagnoses: PAD (peripheral artery disease) ([place]) [I73.9]  Discharge Diagnoses: PAD (peripheral artery disease) ([place]) [I73.9], AKI on CKD  Admission Condition: good  Discharged Condition: good  Indication for Admission: Critical left lower extremity ischemia with second toe ulceration  Hospital Course: 85 year old female previously had right lower extremity amputation.  She had known SFA occlusion.  She had rest pain as well as a small second toe ulceration.  She was indicated for angiography possible invention, likely bypass.  On the day of admission the patient was taken to the peripheral vascular lab and underwent CO2 aortogram.  Findings included aorta and iliac segments heavily calcified although there is no flow-limiting stenosis.  Common femoral artery on the left appears to likely have a coral reef lesion that is probably 83% stenosis.  SFA is flush occluded.  Reconstitutes below the knee popliteal artery anterior tibial arteries alone runoff to the foot.  She was counseled regarding need for left common femoral endarterectomy as well as left femoral to below-knee popliteal artery bypass grafting.  She was taken to the operating room on May 21, 2020 and tolerated the procedure well.  On Postoperative day 1, her vital signs were stable and she was afebrile.  She had brisk Doppler signals on the left.  Incisions were healing without signs of hematoma or infection.  She stated she had improvement in her left foot pain. Her hemoglobin remained stable. Her creatinine was noted to have increased from 2 to 2.29 and her Diovan with HCTZ was held.  Gentle IV fluid hydration was continued.  She was able to ambulate with her prosthesis with physical therapy.  She  had excellent urine output.  On post-Operative day 2, her creatinine was down to 2.07.  Her Foley was discontinued.  Her pain was well controlled.  She was ambulating without difficulty.  She was ready for discharge home in satisfactory condition.  We will continue to hold her ARB and place order for basic metabolic profile in 2 weeks.  Advised to follow-up with primary care physician/ordering physician of her Diovan to reassess continuing the medication in regards to her kidney function. Consults: none Treatments: surgery:  Disposition: Discharge disposition: 01-Home or Self Care       - For VQI Registry use ---  Post-op:  Wound infection: No  Graft infection: No  Transfusion: No  If yes, 0 units given New Arrhythmia: No Patency judged by: [ ]  Dopper only, [ ]  Palpable graft pulse, [ ]  Palpable distal pulse, [ ]  ABI inc. > 0.15, [ ]  Duplex D/C Ambulatory Status: Ambulatory  Complications: MI: [ ]  No, [ ]  Troponin only, [ ]  EKG or Clinical CHF: No Resp failure: [ ]  none, [ ]  Pneumonia, [ ]  Ventilator Chg in renal function: [ ]  none, [ ]  Inc. Cr > 0.5, [ ]  Temp. Dialysis, [ ]  Permanent dialysis Stroke: [ ]  None, [ ]  Minor, [ ]  Major Return to OR: No  Reason for return to OR: [ ]  Bleeding, [ ]  Infection, [ ]  Thrombosis, [ ]  Revision  Discharge medications: Statin use:  Yes ASA use:  Yes Plavix use:  Yes Beta blocker use: Yes Coumadin use: No  for medical reason not indicated    Patient Instructions:  Allergies as of 05/23/2020  Reactions   Invokana [canagliflozin] Itching, Swelling, Other (See Comments)   Vaginal itching, swelling and irritation   Jardiance [empagliflozin] Itching, Swelling, Other (See Comments)   Vaginal itching and swelling   Tuberculin Ppd Other (See Comments)   Per [facility] from [facility]       Medication List    STOP taking these medications   mupirocin ointment 2 % Commonly known as: BACTROBAN   valsartan-hydrochlorothiazide 320-25  MG tablet Commonly known as: DIOVAN-HCT     TAKE these medications   amLODipine 10 MG tablet Commonly known as: NORVASC TAKE 1 TABLET (10 MG TOTAL) BY MOUTH DAILY. FOR HIGH BLOOD PRESSURE   Aspirin Low Dose 81 MG EC tablet Generic drug: aspirin Take 81 mg by mouth daily.   atorvastatin 20 MG tablet Commonly known as: LIPITOR TAKE 1 TABLET BY MOUTH EVERY DAY   B-D ULTRAFINE III SHORT PEN 31G X 8 MM Misc Generic drug: Insulin Pen Needle Use to check blood sugar every day. Dx: 11.29; 11.65   carvedilol 25 MG tablet Commonly known as: COREG TAKE 1 TABLET BY MOUTH TWICE A DAY WITH MEALS What changed: when to take this   clopidogrel 75 MG tablet Commonly known as: PLAVIX TAKE 1 TABLET BY MOUTH EVERY DAY   feeding supplement (GLUCERNA SHAKE) Liqd Take 237 mLs by mouth 3 (three) times daily between meals.   feeding supplement (ENSURE ENLIVE) Liqd Take 237 mLs by mouth 3 (three) times daily between meals.   gabapentin 100 MG capsule Commonly known as: NEURONTIN Take 100 mg by mouth 3 (three) times daily as needed (pain).   glucose 4 GM chewable tablet Chew 1 tablet (4 g total) by mouth as needed for low blood sugar.   HYDROcodone-acetaminophen 5-325 MG tablet Commonly known as: NORCO/VICODIN Take 1 tablet by mouth every 6 (six) hours as needed for moderate pain.   insulin lispro 100 UNIT/ML injection Commonly known as: HumaLOG Inject 0.03 mLs (3 Units total) into the skin 3 (three) times daily after meals.   Lantus SoloStar 100 UNIT/ML Solostar Pen Generic drug: insulin glargine Inject 20 Units into the skin daily.   multivitamin with minerals Tabs tablet Take 1 tablet by mouth daily.   OneTouch Verio test strip Generic drug: glucose blood USE TO TEST BLOOD SUGAR THREE TIMES DAILY. DX: E11.9      Activity: activity as tolerated Diet: cardiac diet and diabetic diet Wound Care: none needed and keep wound clean and dry  Follow-up with Dr. Donnetta Hutching in 3  weeks.  Signed: Barbie Banner 05/23/2020 12:18 PM

## 2020-05-28 ENCOUNTER — Other Ambulatory Visit: Payer: Self-pay

## 2020-05-28 ENCOUNTER — Ambulatory Visit: Payer: Medicare Other | Attending: Vascular Surgery | Admitting: Physical Therapy

## 2020-05-28 ENCOUNTER — Encounter: Payer: Self-pay | Admitting: Physical Therapy

## 2020-05-28 DIAGNOSIS — M6281 Muscle weakness (generalized): Secondary | ICD-10-CM | POA: Insufficient documentation

## 2020-05-28 DIAGNOSIS — M79605 Pain in left leg: Secondary | ICD-10-CM | POA: Insufficient documentation

## 2020-05-28 DIAGNOSIS — Z9181 History of falling: Secondary | ICD-10-CM | POA: Insufficient documentation

## 2020-05-28 DIAGNOSIS — R531 Weakness: Secondary | ICD-10-CM | POA: Insufficient documentation

## 2020-05-28 DIAGNOSIS — R2689 Other abnormalities of gait and mobility: Secondary | ICD-10-CM | POA: Diagnosis not present

## 2020-05-28 DIAGNOSIS — R293 Abnormal posture: Secondary | ICD-10-CM | POA: Insufficient documentation

## 2020-05-28 DIAGNOSIS — R2681 Unsteadiness on feet: Secondary | ICD-10-CM

## 2020-05-28 NOTE — Patient Instructions (Signed)
Position your left leg on foot stool with knee as straight as possible. Think about pressing your heel outward. Keep your foot on stool with knee straight for 5 minutes every 1-2 hours. Position your left leg on foot stool with knee bent as much as possible. Think about sliding the foot back on stool. Keep your foot on stool with knee bent for 5 minutes every 1-2 hours. Place some pillows on armrest of couch. Lay on your back with legs on pillows for 20-30 minutes 2 times per day. Print large capital letters A to Z with your left big toe 2 times per day.

## 2020-05-28 NOTE — Therapy (Signed)
[facility] [address] [place], [place], [address] Phone: [phone]   Fax:  [phone]  Physical Therapy Treatment  Patient Details  Name: Monique Cabrera MRN: [id number] Date of Birth: Jul 03, 1935 Referring Provider (PT): Curt Jews, MD   Encounter Date: 05/28/2020  PT End of Session - 05/28/20 1412    Visit Number  13    Number of Visits  25    Date for PT Re-Evaluation  06/24/20    Authorization Type  [employer] Medicare & Medicaid    Progress Note Due on Visit  20    PT Start Time  1321    PT Stop Time  1405    PT Time Calculation (min)  44 min    Equipment Utilized During Treatment  Gait belt    Activity Tolerance  Patient tolerated treatment well;No increased pain    Behavior During Therapy  WFL for tasks assessed/performed       Past Medical History:  Diagnosis Date  . Acute upper respiratory infections of unspecified site   . Amputee 08/2019  . Anemia   . Anemia, unspecified   . Arthritis   . Atherosclerosis of native arteries of the extremities, unspecified   . Chest pain, unspecified   . Chronic kidney disease (CKD), stage II (mild)    [facility]  . Diabetes mellitus without complication ([place])    Type II  . Diarrhea   . Disorder of bone and cartilage, unspecified   . Herpes zoster with other nervous system complications(053.19)   . History of blood transfusion   . Hypercalcemia   . Hypertension   . Hypertensive renal disease, benign   . Nonspecific reaction to tuberculin skin test without active tuberculosis(795.51)   . Other and unspecified hyperlipidemia   . PAD (peripheral artery disease) ([place])    Per records from [name]  . Pain in joint, lower leg   . Peripheral arterial disease ([employer])   . Postherpetic neuralgia   . Proteinuria   . Stroke ([place]) 01/2017  . Type II or unspecified type diabetes mellitus with renal manifestations, uncontrolled(250.42)   . Unspecified disorder of  kidney and ureter     Past Surgical History:  Procedure Laterality Date  . ABDOMINAL AORTOGRAM W/LOWER EXTREMITY N/A 10/31/2019   Procedure: ABDOMINAL AORTOGRAM W/LOWER EXTREMITY;  Surgeon: Wellington Hampshire, MD;  Location: [facility];  Service: Cardiovascular;  Laterality: N/A;  . ABDOMINAL AORTOGRAM W/LOWER EXTREMITY Left 05/12/2020   Procedure: ABDOMINAL AORTOGRAM W/LOWER EXTREMITY;  Surgeon: Waynetta Sandy, MD;  Location: [facility];  Service: Cardiovascular;  Laterality: Left;  . AMPUTATION Right 11/02/2019   Procedure: AMPUTATION BELOW KNEE RIGHT;  Surgeon: Rosetta Posner, MD;  Location: [facility];  Service: Vascular;  Laterality: Right;  . COLONOSCOPY     x 2  . ENDARTERECTOMY FEMORAL Left 05/21/2020   Procedure: LEFT COMMON FEMORAL ENDARTERECTOMY;  Surgeon: Rosetta Posner, MD;  Location: [facility];  Service: Vascular;  Laterality: Left;  . FEMORAL-POPLITEAL BYPASS GRAFT Left 05/21/2020   Procedure: LEFT FEMORAL TO BELOW KNEE POPLITEAL ARTERY BYPASS GRAFT;  Surgeon: Rosetta Posner, MD;  Location: [facility];  Service: Vascular;  Laterality: Left;  . hysterectomy    . INCISION AND DRAINAGE Left 05/27/14   sebacous cyst, ear  . PRP Left    Dr. Ricki Miller  . removal of cyst from hand    . removal of tumor from foot    . TONSILLECTOMY  There were no vitals filed for this visit.  Subjective Assessment - 05/28/20 1315    Subjective  She moved to daughter's 2 weeks ago and no issues. She had vascular femoral artery on 5/26. She had PT 2 times in hospital. MD okayed to resume OP PT with gait.    Limitations  Lifting;Standing;Walking;House hold activities    Patient Stated Goals  To use prosthesis to walk without device, take care of myself, live alone. Go out in community.    Currently in Pain?  Yes    Pain Score  4    no pain sitting & 4/10 walking   Pain Location  Leg    Pain Orientation  Left;Medial   groin & medial knee   Pain Descriptors / Indicators  Other  (Comment)   pulling   Pain Type  Surgical pain    Pain Onset  More than a month ago    Pain Frequency  Intermittent    Aggravating Factors   walking    Pain Relieving Factors  sitting                        [facility] Adult PT Treatment/Exercise - 05/28/20 1315      Transfers   Transfers  Sit to Stand;Stand to Sit    Sit to Stand  5: Supervision;With upper extremity assist;With armrests;From chair/3-in-1;Other (comment)   to RW   Stand to Sit  5: Supervision;With upper extremity assist;With armrests;To chair/3-in-1;Other (comment)   from RW     Ambulation/Gait   Ambulation/Gait  Yes    Ambulation/Gait Assistance  5: Supervision    Ambulation/Gait Assistance Details  verbal cues to use RW while LLE is healing from surgery    Ambulation Distance (Feet)  250 Feet    Assistive device  Rolling walker;Prosthesis    Gait Pattern  Step-through pattern;Decreased stride length;Decreased stance time - right;Decreased step length - left;Narrow base of support;Trunk flexed    Ambulation Surface  Level;Indoor    Ramp  4: Min assist   Min guard RW & prosthesis   Ramp Details (indicate cue type and reason)  vebal & tactile cues on upright posture & wt shift onto toes ascending / heels descending    Curb  4: Min assist   Min guard with RW & TTA prosthesis    Curb Details (indicate cue type and reason)  verbal cues on sequence & technique      Self-Care   Self-Care  ADL's    ADL's  use prosthesis & shoe on left foot to enter / exit bathroom for bathing.        Prosthetics   Prosthetic Care Comments   donne liner while supine or sidelying first thing in morning then donne prosthesis once sits up. Ambulate with RW to toilet instead of crawling on hands & knees.  Donne prosthesis upon arising until bedtime. drying limb/liner with sweating.  change liners / cleaning dirty one after dinner instead of bed time.       Current prosthetic wear tolerance (days/week)   daily    Current  prosthetic wear tolerance (#hours/day)   1-2 hours after arising and removes 1-2 hours before bedtime. PT recommended donning upon arising until bedtime.      Current prosthetic weight-bearing tolerance (hours/day)   No residual limb pain.  Pain in left leg from surgery on 5/26. But patient reports felt better ambulating after PT exercises.     Edema  none  Residual limb condition   residual limb with no open areas, normal color, temperature & moisture, cylinderical shape.    Left leg with medial scabbed incision on medial lower limb with no drainage & no dehisance. Edema present especially knee area,    Education Provided  Skin check;Residual limb care;Proper wear schedule/adjustment;Other (comment)   see prosthetic care comments   Person(s) Educated  Patient    Education Method  Explanation;Demonstration    Education Method  Verbalized understanding;Verbal cues required;Needs further instruction    Donning Prosthesis  Supervision    Doffing Prosthesis  Supervision             PT Education - 05/28/20 1400    Education Details  HEP for Left lower extremity range & edema management    Person(s) Educated  Patient    Methods  Explanation;Demonstration;Tactile cues;Verbal cues;Handout    Comprehension  Verbalized understanding;Returned demonstration;Verbal cues required;Tactile cues required;Need further instruction       PT Short Term Goals - 05/28/20 2240      PT SHORT TERM GOAL #1   Title  Patient verbalizes adjusting ply socks with limb volume changes with prosthesis. (All STGs Target Date: 05/23/2020)    Time  1    Period  Months    Status  Achieved    Target Date  05/23/20      PT SHORT TERM GOAL #2   Title  Patient tolerates wear of prosthesis >12 hrs total / day without wound increasing.    Baseline  MET 05/28/2020    Time  1    Period  Months    Status  Achieved    Target Date  05/23/20      PT SHORT TERM GOAL #3   Title  Berg Balance >36/56    Baseline  NOT  RETESTED due to surgery 5/26    Time  1    Period  Months    Status  Deferred    Target Date  05/23/20      PT SHORT TERM GOAL #4   Title  Patient ambulates 200' with cane & prosthesis with supervision.    Baseline  Deferred due to surgery on 05/21/2020    Time  1    Period  Months    Status  Deferred    Target Date  05/23/20      PT SHORT TERM GOAL #5   Title  Patient negotiates stairs with single rail, ramps & curbs with cane & prosthesis with minA.    Baseline  deferred due to surgery on 05/21/2020    Time  1    Period  Months    Status  Deferred    Target Date  05/23/20        PT Long Term Goals - 05/07/20 2047      PT LONG TERM GOAL #1   Title  Patient verbalizes & demonstrates understanding of prosthetic care & diabetic foot care to enable safe utilization. (All LTGs Target Date: 06/24/2020)    Time  3    Period  Months    Status  On-going    Target Date  06/24/20      PT LONG TERM GOAL #2   Title  Patient tolerates prosthesis wear >90% of awake hours without skin or residual limb pain to enable function throughout her day.    Time  3    Period  Months    Status  On-going    Target Date  06/24/20  PT LONG TERM GOAL #3   Title  Berg Balance >/= 45/56 to indicate lower fall risk.    Time  3    Period  Months    Status  On-going    Target Date  06/24/20      PT LONG TERM GOAL #4   Title  Patient ambulates 500' outdoors including grass with cane or less & prosthesis modified independent to enable community mobility.    Time  3    Period  Months    Status  On-going    Target Date  06/24/20      PT LONG TERM GOAL #5   Title  Patient negotiates ramps, curbs & stairs with single rail with cane or less & prosthesis modified independent to enable community access.    Time  3    Period  Months    Status  On-going    Target Date  06/24/20      PT LONG TERM GOAL #6   Title  Patient demonstrates skills to enable her to return to gardening including kneeling  with UE support like garden kneel bench or chair.    Time  3    Period  Months    Status  On-going    Target Date  06/24/20            Plan - 05/28/20 1413    Clinical Impression Statement  Patient had vascular surgery on 05/21/2020. PT instructed in positioning for knee extension & knee flexion, elevation for edema and ankle A-Z exercise. Pt reported left leg felt better with standing & gait after these exercises.  PT recommended ambulating with RW & prosthesis until LLE heals.  PT instructed in increased wear & use of prosthesis to enter/exit bathroom.    Personal Factors and Comorbidities  Age;Comorbidity 3+;Fitness;Time since onset of injury/illness/exacerbation;Transportation    Comorbidities  Right TTA, HTN, DM, CVA, PAD, HLD, CKD st2,    Examination-Activity Limitations  Locomotion Level;Stairs;Stand;Transfers;Toileting    Examination-Participation Restrictions  Community Activity;Yard Work;Shop    Stability/Clinical Decision Making  Evolving/Moderate complexity    Rehab Potential  Good    PT Frequency  2x / week    PT Duration  12 weeks    PT Treatment/Interventions  ADLs/Self Care Home Management;DME Instruction;Gait training;Stair training;Functional mobility training;Therapeutic activities;Therapeutic exercise;Balance training;Neuromuscular re-education;Patient/family education;Prosthetic Training;Vestibular    PT Next Visit Plan  check left leg healing & pain, standing balance without UE support, prosthetic gait training with cane in therapy if LLE tolerates    Consulted and Agree with Plan of Care  Patient       Patient will benefit from skilled therapeutic intervention in order to improve the following deficits and impairments:  Abnormal gait, Decreased activity tolerance, Decreased balance, Decreased endurance, Decreased knowledge of use of DME, Decreased mobility, Decreased skin integrity, Decreased strength, Postural dysfunction, Prosthetic Dependency, Pain  Visit  Diagnosis: Other abnormalities of gait and mobility  Unsteadiness on feet  Abnormal posture  Weakness generalized  History of fall  Pain in left leg     Problem List Patient Active Problem List   Diagnosis Date Noted  . PAD (peripheral artery disease) ([place]) 05/21/2020  . Acute metabolic encephalopathy 39/76/7341  . COVID-19 virus infection 02/08/2020  . Acute kidney injury superimposed on CKD ([place]) 02/08/2020  . Status post below-knee amputation of right lower extremity ([place]) 11/07/2019  . Gangrene of right foot ([place])   . Hyponatremia 10/29/2019  . Chronic diastolic CHF (congestive heart failure) ([place])  10/29/2019  . Stable proliferative diabetic retinopathy of both eyes associated with type 2 diabetes mellitus ([place]) 06/05/2018  . Acute CVA (cerebrovascular accident) ([place]) 02/18/2017  . Acute ischemic stroke ([place])   . Internuclear ophthalmoplegia of left eye   . Benign paroxysmal positional vertigo   . Abnormality of gait   . Acute onset of severe vertigo 02/17/2017  . Vertigo 02/17/2017  . Atherosclerosis of native artery of extremity with intermittent claudication ([place]) 03/11/2014  . Diabetic retinopathy ([place]) 03/11/2014  . Retinal hemorrhage due to secondary diabetes ([place]) 03/11/2014  . Type II diabetes mellitus with renal manifestations, uncontrolled ([place]) 03/11/2014  . Chronic hepatitis C without hepatic coma ([place]) 03/11/2014  . Hyperlipidemia 03/11/2014  . Hypoglycemia 04/16/2013  . Disorder of bone and cartilage, unspecified   . Other and unspecified hyperlipidemia   . Essential hypertension, benign   . Atherosclerosis of native artery of extremity ([place])   . Chronic kidney disease (CKD), stage II (mild)   . Peripheral arterial disease ([place])   . Anemia   . Postherpetic neuralgia   . Hypertension     Jamey Reas PT, DPT 05/28/2020, 10:45 PM  [facility] [address] [place], [place],  [address] Phone: [phone]   Fax:  [phone]  Name: HAYAT WARBINGTON MRN: [id number] Date of Birth: [birth year]

## 2020-05-29 ENCOUNTER — Encounter: Payer: Self-pay | Admitting: Family

## 2020-05-30 ENCOUNTER — Encounter: Payer: Self-pay | Admitting: Physical Therapy

## 2020-05-30 ENCOUNTER — Ambulatory Visit: Payer: Medicare Other | Admitting: Physical Therapy

## 2020-05-30 ENCOUNTER — Other Ambulatory Visit: Payer: Self-pay

## 2020-05-30 DIAGNOSIS — R293 Abnormal posture: Secondary | ICD-10-CM | POA: Diagnosis not present

## 2020-05-30 DIAGNOSIS — R531 Weakness: Secondary | ICD-10-CM

## 2020-05-30 DIAGNOSIS — Z9181 History of falling: Secondary | ICD-10-CM | POA: Diagnosis not present

## 2020-05-30 DIAGNOSIS — R2681 Unsteadiness on feet: Secondary | ICD-10-CM

## 2020-05-30 DIAGNOSIS — M79605 Pain in left leg: Secondary | ICD-10-CM | POA: Diagnosis not present

## 2020-05-30 DIAGNOSIS — R2689 Other abnormalities of gait and mobility: Secondary | ICD-10-CM

## 2020-05-30 DIAGNOSIS — M6281 Muscle weakness (generalized): Secondary | ICD-10-CM | POA: Diagnosis not present

## 2020-05-30 NOTE — Therapy (Signed)
[facility] [address] [place], [place], [address] Phone: [phone]   Fax:  [phone]  Physical Therapy Treatment  Patient Details  Name: Monique Cabrera MRN: [id number] Date of Birth: [birth year] Referring Provider (PT): Curt Jews, MD   Encounter Date: 05/30/2020  PT End of Session - 05/30/20 1325    Visit Number  14    Number of Visits  25    Date for PT Re-Evaluation  06/24/20    Authorization Type  [employer] Medicare & Medicaid    Progress Note Due on Visit  20    PT Start Time  1317    PT Stop Time  1358    PT Time Calculation (min)  41 min    Equipment Utilized During Treatment  Gait belt    Activity Tolerance  Patient tolerated treatment well;No increased pain    Behavior During Therapy  WFL for tasks assessed/performed       Past Medical History:  Diagnosis Date  . Acute upper respiratory infections of unspecified site   . Amputee 08/2019  . Anemia   . Anemia, unspecified   . Arthritis   . Atherosclerosis of native arteries of the extremities, unspecified   . Chest pain, unspecified   . Chronic kidney disease (CKD), stage II (mild)    [facility]  . Diabetes mellitus without complication ([place])    Type II  . Diarrhea   . Disorder of bone and cartilage, unspecified   . Herpes zoster with other nervous system complications(053.19)   . History of blood transfusion   . Hypercalcemia   . Hypertension   . Hypertensive renal disease, benign   . Nonspecific reaction to tuberculin skin test without active tuberculosis(795.51)   . Other and unspecified hyperlipidemia   . PAD (peripheral artery disease) ([place])    Per records from [facility]  . Pain in joint, lower leg   . Peripheral arterial disease ([place])   . Postherpetic neuralgia   . Proteinuria   . Stroke ([place]) 01/2017  . Type II or unspecified type diabetes mellitus with renal manifestations, uncontrolled(250.42)   . Unspecified disorder of  kidney and ureter     Past Surgical History:  Procedure Laterality Date  . ABDOMINAL AORTOGRAM W/LOWER EXTREMITY N/A 10/31/2019   Procedure: ABDOMINAL AORTOGRAM W/LOWER EXTREMITY;  Surgeon: Wellington Hampshire, MD;  Location: [facility];  Service: Cardiovascular;  Laterality: N/A;  . ABDOMINAL AORTOGRAM W/LOWER EXTREMITY Left 05/12/2020   Procedure: ABDOMINAL AORTOGRAM W/LOWER EXTREMITY;  Surgeon: Waynetta Sandy, MD;  Location: [facility];  Service: Cardiovascular;  Laterality: Left;  . AMPUTATION Right 11/02/2019   Procedure: AMPUTATION BELOW KNEE RIGHT;  Surgeon: Rosetta Posner, MD;  Location: [facility];  Service: Vascular;  Laterality: Right;  . COLONOSCOPY     x 2  . ENDARTERECTOMY FEMORAL Left 05/21/2020   Procedure: LEFT COMMON FEMORAL ENDARTERECTOMY;  Surgeon: Rosetta Posner, MD;  Location: [facility];  Service: Vascular;  Laterality: Left;  . FEMORAL-POPLITEAL BYPASS GRAFT Left 05/21/2020   Procedure: LEFT FEMORAL TO BELOW KNEE POPLITEAL ARTERY BYPASS GRAFT;  Surgeon: Rosetta Posner, MD;  Location: [facility];  Service: Vascular;  Laterality: Left;  . hysterectomy    . INCISION AND DRAINAGE Left 05/27/14   sebacous cyst, ear  . PRP Left    Dr. Ricki Miller  . removal of cyst from hand    . removal of tumor from foot    . TONSILLECTOMY  There were no vitals filed for this visit.  Subjective Assessment - 05/30/20 1320    Subjective  No new complaints. No falls. Has been doing the ex's for the left LE. Unable to don liner while lying down, she can't reach to the end of her limb and align it correctly.    Limitations  Lifting;Standing;Walking;House hold activities    Patient Stated Goals  To use prosthesis to walk without device, take care of myself, live alone. Go out in community.    Currently in Pain?  Yes    Pain Score  2    1-2/10 at rest. 3/10 with walking   Pain Location  Leg    Pain Orientation  Left;Medial    Pain Descriptors / Indicators  Other (Comment)    "pulling"   Pain Type  Surgical pain    Pain Onset  1 to 4 weeks ago    Pain Frequency  Intermittent    Aggravating Factors   weight bearing on left LE with walking    Pain Relieving Factors  sitting            [facility] Adult PT Treatment/Exercise - 05/30/20 1328      Transfers   Transfers  Sit to Stand;Stand to Sit    Sit to Stand  5: Supervision;With upper extremity assist;With armrests;From chair/3-in-1;Other (comment)    Stand to Sit  5: Supervision;With upper extremity assist;With armrests;To chair/3-in-1;Other (comment)      Ambulation/Gait   Ambulation/Gait  Yes    Ambulation/Gait Assistance  4: Min guard    Ambulation/Gait Assistance Details  reminder cues to stay close to walker, look forward vs at feet and for equal step length.  introduced the rollator after pt reports having one at home and wondering if it would be okay for her to use with going to church. Pt needed min guard downgrading to supervision assist with rollator. Demo'd reciprocal gait pattern with equal step length and upright posture with rollator.     Ambulation Distance (Feet)  500 Feet   x1 in/outdoors RW; 220 x1 w/rollator   Assistive device  Rolling walker;Prosthesis;Rollator    Gait Pattern  Step-through pattern;Decreased stride length;Decreased stance time - right;Decreased step length - left;Narrow base of support;Trunk flexed    Ambulation Surface  Level;Unlevel;Indoor;Outdoor;Paved      Prosthetics   Current prosthetic wear tolerance (days/week)   daily    Current prosthetic wear tolerance (#hours/day)   dons upon rising in the am and wears until bedtime, drying as needed    Residual limb condition   residual limb intact with no issues; left LE- scabs over incision with ~2 cm opened area at top of incision. covered with Tegaderm. Pt advised if it opens any more to call the surgeon.     Education Provided  Residual limb care;Care of non-amputated limb;Proper wear schedule/adjustment;Proper  weight-bearing schedule/adjustment    Person(s) Educated  Patient    Education Method  Explanation;Demonstration;Verbal cues    Education Method  Verbalized understanding;Verbal cues required;Returned demonstration    Donning Prosthesis  Supervision    Doffing Prosthesis  Supervision          PT Short Term Goals - 05/28/20 2240      PT SHORT TERM GOAL #1   Title  Patient verbalizes adjusting ply socks with limb volume changes with prosthesis. (All STGs Target Date: 05/23/2020)    Time  1    Period  Months    Status  Achieved    Target  Date  05/23/20      PT SHORT TERM GOAL #2   Title  Patient tolerates wear of prosthesis >12 hrs total / day without wound increasing.    Baseline  MET 05/28/2020    Time  1    Period  Months    Status  Achieved    Target Date  05/23/20      PT SHORT TERM GOAL #3   Title  Berg Balance >36/56    Baseline  NOT RETESTED due to surgery 5/26    Time  1    Period  Months    Status  Deferred    Target Date  05/23/20      PT SHORT TERM GOAL #4   Title  Patient ambulates 200' with cane & prosthesis with supervision.    Baseline  Deferred due to surgery on 05/21/2020    Time  1    Period  Months    Status  Deferred    Target Date  05/23/20      PT SHORT TERM GOAL #5   Title  Patient negotiates stairs with single rail, ramps & curbs with cane & prosthesis with minA.    Baseline  deferred due to surgery on 05/21/2020    Time  1    Period  Months    Status  Deferred    Target Date  05/23/20        PT Long Term Goals - 05/07/20 2047      PT LONG TERM GOAL #1   Title  Patient verbalizes & demonstrates understanding of prosthetic care & diabetic foot care to enable safe utilization. (All LTGs Target Date: 06/24/2020)    Time  3    Period  Months    Status  On-going    Target Date  06/24/20      PT LONG TERM GOAL #2   Title  Patient tolerates prosthesis wear >90% of awake hours without skin or residual limb pain to enable function throughout  her day.    Time  3    Period  Months    Status  On-going    Target Date  06/24/20      PT LONG TERM GOAL #3   Title  Berg Balance >/= 45/56 to indicate lower fall risk.    Time  3    Period  Months    Status  On-going    Target Date  06/24/20      PT LONG TERM GOAL #4   Title  Patient ambulates 500' outdoors including grass with cane or less & prosthesis modified independent to enable community mobility.    Time  3    Period  Months    Status  On-going    Target Date  06/24/20      PT LONG TERM GOAL #5   Title  Patient negotiates ramps, curbs & stairs with single rail with cane or less & prosthesis modified independent to enable community access.    Time  3    Period  Months    Status  On-going    Target Date  06/24/20      PT LONG TERM GOAL #6   Title  Patient demonstrates skills to enable her to return to gardening including kneeling with UE support like garden kneel bench or chair.    Time  3    Period  Months    Status  On-going    Target Date  06/24/20  Plan - 05/30/20 1325    Clinical Impression Statement  Today's skilled session continued to address gait with RW/prosthesis. Introduced gait on paved outdoor surface with up to min guard assist needed due to walker catching wheels in cracks of walkway. Also introdued gait with rollator as pt reports having one at home and wanting to use it for church. Pt progressed to supervision with rollator on indoor surfaces. The pt is making steady progress toward goals and should benefit from continued PT to progress toward unmet goals.    Personal Factors and Comorbidities  Age;Comorbidity 3+;Fitness;Time since onset of injury/illness/exacerbation;Transportation    Comorbidities  Right TTA, HTN, DM, CVA, PAD, HLD, CKD st2,    Examination-Activity Limitations  Locomotion Level;Stairs;Stand;Transfers;Toileting    Examination-Participation Restrictions  Community Activity;Yard Work;Shop    Stability/Clinical Decision  Making  Evolving/Moderate complexity    Rehab Potential  Good    PT Frequency  2x / week    PT Duration  12 weeks    PT Treatment/Interventions  ADLs/Self Care Home Management;DME Instruction;Gait training;Stair training;Functional mobility training;Therapeutic activities;Therapeutic exercise;Balance training;Neuromuscular re-education;Patient/family education;Prosthetic Training;Vestibular    PT Next Visit Plan  check left leg healing & pain, standing balance without UE support, prosthetic gait training with cane in therapy if LLE tolerates; outdoor gait with rollator, including ramps/curb    Consulted and Agree with Plan of Care  Patient       Patient will benefit from skilled therapeutic intervention in order to improve the following deficits and impairments:  Abnormal gait, Decreased activity tolerance, Decreased balance, Decreased endurance, Decreased knowledge of use of DME, Decreased mobility, Decreased skin integrity, Decreased strength, Postural dysfunction, Prosthetic Dependency, Pain  Visit Diagnosis: Other abnormalities of gait and mobility  Unsteadiness on feet  Abnormal posture  Weakness generalized     Problem List Patient Active Problem List   Diagnosis Date Noted  . PAD (peripheral artery disease) ([place]) 05/21/2020  . Acute metabolic encephalopathy 35/36/1443  . COVID-19 virus infection 02/08/2020  . Acute kidney injury superimposed on CKD ([place]) 02/08/2020  . Status post below-knee amputation of right lower extremity ([place]) 11/07/2019  . Gangrene of right foot ([place])   . Hyponatremia 10/29/2019  . Chronic diastolic CHF (congestive heart failure) ([place]) 10/29/2019  . Stable proliferative diabetic retinopathy of both eyes associated with type 2 diabetes mellitus ([place]) 06/05/2018  . Acute CVA (cerebrovascular accident) ([place]) 02/18/2017  . Acute ischemic stroke ([place])   . Internuclear ophthalmoplegia of left eye   . Benign paroxysmal positional vertigo   . Abnormality  of gait   . Acute onset of severe vertigo 02/17/2017  . Vertigo 02/17/2017  . Atherosclerosis of native artery of extremity with intermittent claudication ([place]) 03/11/2014  . Diabetic retinopathy ([place]) 03/11/2014  . Retinal hemorrhage due to secondary diabetes ([place]) 03/11/2014  . Type II diabetes mellitus with renal manifestations, uncontrolled ([place]) 03/11/2014  . Chronic hepatitis C without hepatic coma ([place]) 03/11/2014  . Hyperlipidemia 03/11/2014  . Hypoglycemia 04/16/2013  . Disorder of bone and cartilage, unspecified   . Other and unspecified hyperlipidemia   . Essential hypertension, benign   . Atherosclerosis of native artery of extremity ([place])   . Chronic kidney disease (CKD), stage II (mild)   . Peripheral arterial disease ([place])   . Anemia   . Postherpetic neuralgia   . Hypertension     [place], [place], [place] [address], [place], [place] [address] [phone] 05/30/20, 4:11 PM   Name: Monique Cabrera  MRN: [id number] Date of Birth: 01-23-1935

## 2020-06-02 ENCOUNTER — Encounter: Payer: Self-pay | Admitting: Rehabilitation

## 2020-06-02 ENCOUNTER — Ambulatory Visit: Payer: Medicare Other | Admitting: Rehabilitation

## 2020-06-02 ENCOUNTER — Other Ambulatory Visit: Payer: Self-pay

## 2020-06-02 DIAGNOSIS — R2689 Other abnormalities of gait and mobility: Secondary | ICD-10-CM | POA: Diagnosis not present

## 2020-06-02 DIAGNOSIS — R531 Weakness: Secondary | ICD-10-CM | POA: Diagnosis not present

## 2020-06-02 DIAGNOSIS — R293 Abnormal posture: Secondary | ICD-10-CM

## 2020-06-02 DIAGNOSIS — M6281 Muscle weakness (generalized): Secondary | ICD-10-CM | POA: Diagnosis not present

## 2020-06-02 DIAGNOSIS — R2681 Unsteadiness on feet: Secondary | ICD-10-CM

## 2020-06-02 DIAGNOSIS — Z9181 History of falling: Secondary | ICD-10-CM

## 2020-06-02 DIAGNOSIS — M79605 Pain in left leg: Secondary | ICD-10-CM | POA: Diagnosis not present

## 2020-06-02 NOTE — Therapy (Signed)
[facility] [address] [place], [place], [address] Phone: [phone]   Fax:  [phone]  Physical Therapy Treatment  Patient Details  Name: Monique Cabrera MRN: [id number] Date of Birth: 02/21/1935 Referring Provider (PT): Curt Jews, MD   Encounter Date: 06/02/2020  PT End of Session - 06/02/20 1508    Visit Number  15    Number of Visits  25    Date for PT Re-Evaluation  06/24/20    Authorization Type  [employer] Medicare & Medicaid    Progress Note Due on Visit  20    PT Start Time  1316    PT Stop Time  1400    PT Time Calculation (min)  44 min    Equipment Utilized During Treatment  Gait belt    Activity Tolerance  Patient tolerated treatment well;No increased pain    Behavior During Therapy  WFL for tasks assessed/performed       Past Medical History:  Diagnosis Date  . Acute upper respiratory infections of unspecified site   . Amputee 08/2019  . Anemia   . Anemia, unspecified   . Arthritis   . Atherosclerosis of native arteries of the extremities, unspecified   . Chest pain, unspecified   . Chronic kidney disease (CKD), stage II (mild)    [facility]  . Diabetes mellitus without complication ([place])    Type II  . Diarrhea   . Disorder of bone and cartilage, unspecified   . Herpes zoster with other nervous system complications(053.19)   . History of blood transfusion   . Hypercalcemia   . Hypertension   . Hypertensive renal disease, benign   . Nonspecific reaction to tuberculin skin test without active tuberculosis(795.51)   . Other and unspecified hyperlipidemia   . PAD (peripheral artery disease) ([place])    Per records from [name]  . Pain in joint, lower leg   . Peripheral arterial disease ([place])   . Postherpetic neuralgia   . Proteinuria   . Stroke ([place]) 01/2017  . Type II or unspecified type diabetes mellitus with renal manifestations, uncontrolled(250.42)   . Unspecified disorder of  kidney and ureter     Past Surgical History:  Procedure Laterality Date  . ABDOMINAL AORTOGRAM W/LOWER EXTREMITY N/A 10/31/2019   Procedure: ABDOMINAL AORTOGRAM W/LOWER EXTREMITY;  Surgeon: Wellington Hampshire, MD;  Location: [facility];  Service: Cardiovascular;  Laterality: N/A;  . ABDOMINAL AORTOGRAM W/LOWER EXTREMITY Left 05/12/2020   Procedure: ABDOMINAL AORTOGRAM W/LOWER EXTREMITY;  Surgeon: Waynetta Sandy, MD;  Location: [facility];  Service: Cardiovascular;  Laterality: Left;  . AMPUTATION Right 11/02/2019   Procedure: AMPUTATION BELOW KNEE RIGHT;  Surgeon: Rosetta Posner, MD;  Location: [facility];  Service: Vascular;  Laterality: Right;  . COLONOSCOPY     x 2  . ENDARTERECTOMY FEMORAL Left 05/21/2020   Procedure: LEFT COMMON FEMORAL ENDARTERECTOMY;  Surgeon: Rosetta Posner, MD;  Location: [facility];  Service: Vascular;  Laterality: Left;  . FEMORAL-POPLITEAL BYPASS GRAFT Left 05/21/2020   Procedure: LEFT FEMORAL TO BELOW KNEE POPLITEAL ARTERY BYPASS GRAFT;  Surgeon: Rosetta Posner, MD;  Location: [facility];  Service: Vascular;  Laterality: Left;  . hysterectomy    . INCISION AND DRAINAGE Left 05/27/14   sebacous cyst, ear  . PRP Left    Dr. Ricki Miller  . removal of cyst from hand    . removal of tumor from foot    . TONSILLECTOMY  There were no vitals filed for this visit.  Subjective Assessment - 06/02/20 1404    Subjective  Pt reports needing to tegaderm and not being able to remove current one or don new one.  Also reports pain in R lower leg on both medial and lateral side.    Limitations  Lifting;Standing;Walking;House hold activities    Patient Stated Goals  To use prosthesis to walk without device, take care of myself, live alone. Go out in community.    Currently in Pain?  Yes    Pain Score  4     Pain Location  Leg    Pain Orientation  Right    Pain Descriptors / Indicators  Aching    Pain Type  Acute pain    Pain Onset  In the past 7 days    Pain  Frequency  Intermittent    Aggravating Factors   WB through RLE as the day goes on    Pain Relieving Factors  sitting, removing leg                        [facility] Adult PT Treatment/Exercise - 06/02/20 1406      Transfers   Transfers  Sit to Stand;Stand to Sit    Sit to Stand  5: Supervision;With upper extremity assist;With armrests    Sit to Stand Details  Verbal cues for sequencing;Verbal cues for technique;Verbal cues for safe use of DME/AE    Stand to Sit  5: Supervision;With upper extremity assist;With armrests;To chair/3-in-1;Other (comment)    Stand to Sit Details (indicate cue type and reason)  Verbal cues for sequencing;Verbal cues for technique;Verbal cues for safe use of DME/AE      Ambulation/Gait   Ambulation/Gait  Yes    Ambulation/Gait Assistance  5: Supervision    Ambulation/Gait Assistance Details  Min cues for forward gaze, improved weight shift over R prosthesis and R hip extension during stance phase of gait.  Note antalgic gait pattern along with Trendelenberg seemingly due to pt being too far inside of R socket.  Pt did not have socks with her today, only 3 ply was donned.     Ambulation Distance (Feet)  230 Feet    Assistive device  4-wheeled walker;Prosthesis    Gait Pattern  Step-through pattern;Decreased stride length;Decreased stance time - right;Decreased step length - left;Narrow base of support;Trunk flexed    Ambulation Surface  Level;Indoor    Ramp  4: Min assist    Ramp Details (indicate cue type and reason)  Cues for sequencing and technique with rollator with demo and cues for forward weight shift onto R leg and how to keep brakes active when descending ramp for safety.      Curb  4: Min assist    Curb Details (indicate cue type and reason)  Cues for sequencing and technique with rollator, maintaining brakes in locked position and moving front wheels then back wheels.  Cues for stepping sequence as well.        Neuro Re-ed    Neuro Re-ed  Details   Corner balance standing on small rocker board biased vertically maintaining balance x 3 reps of 10-15 secs.  Pt with heavy posterior bias, therefore had her lean against corner and then use trunk/hips to move off wall (modified wall bump).  Pt did well with intermittent UE support and tacile cues for maintaining more forward weight shift.  Then transitioned to board being biased horizontally.  Pt with  tendency to shift to the L, therefore provided light tactile and verbal cues for increased R weight shift.  performed x 3 reps of 10-15 secs.        Prosthetics   Prosthetic Care Comments   PT continues to provide extensive education on sock ply adjustment throughout the day, esp now that she is more ambulatory.  She did not bring socks with her to session.   Discussed that she will still likely have to begin with no/lower ply sock in the am, however we may need to add socks in the afternoon.  Also discussed keeping prosthesis donned throughout the day and problem solved how to prop R leg with prosthesis donned rather than removing and lying in bed to rest.  Pt reports she will do in recliner.  Discussed pressure distribution and that leg should feel snug but not overly painful in certain areas.  Educated to play with sock ply at home first when having pain to see if this will alleviate.  At beginning of session pt reports needing to remove current tegaderm and not being able to correctly apply at home.  PT carefully removed current tegaderm and PT re-educated and demonstrated how to apply tegaderm.     Current prosthetic wear tolerance (days/week)   daily    Current prosthetic wear tolerance (#hours/day)   dons upon rising in the am and wears until bedtime, drying as needed.  She does endorse removing leg some days half way through day due to pain.      Current prosthetic weight-bearing tolerance (hours/day)   Pt with R residual limb pain during session that increases during WB    Edema  none     Residual limb condition   residual limb intact with no issues; left LE- scabs over incision with ~2 cm opened area at top of incision. covered with Tegaderm. Pt advised if it opens any more to call the surgeon.     Education Provided  Skin check;Residual limb care;Care of non-amputated limb;Correct ply sock adjustment;Proper wear schedule/adjustment    Person(s) Educated  Patient    Education Method  Explanation;Verbal cues    Education Method  Verbalized understanding;Needs further instruction    Donning Prosthesis  Supervision    Doffing Prosthesis  Supervision               PT Short Term Goals - 05/28/20 2240      PT SHORT TERM GOAL #1   Title  Patient verbalizes adjusting ply socks with limb volume changes with prosthesis. (All STGs Target Date: 05/23/2020)    Time  1    Period  Months    Status  Achieved    Target Date  05/23/20      PT SHORT TERM GOAL #2   Title  Patient tolerates wear of prosthesis >12 hrs total / day without wound increasing.    Baseline  MET 05/28/2020    Time  1    Period  Months    Status  Achieved    Target Date  05/23/20      PT SHORT TERM GOAL #3   Title  Berg Balance >36/56    Baseline  NOT RETESTED due to surgery 5/26    Time  1    Period  Months    Status  Deferred    Target Date  05/23/20      PT SHORT TERM GOAL #4   Title  Patient ambulates 200' with cane & prosthesis with supervision.  Baseline  Deferred due to surgery on 05/21/2020    Time  1    Period  Months    Status  Deferred    Target Date  05/23/20      PT SHORT TERM GOAL #5   Title  Patient negotiates stairs with single rail, ramps & curbs with cane & prosthesis with minA.    Baseline  deferred due to surgery on 05/21/2020    Time  1    Period  Months    Status  Deferred    Target Date  05/23/20        PT Long Term Goals - 05/07/20 2047      PT LONG TERM GOAL #1   Title  Patient verbalizes & demonstrates understanding of prosthetic care & diabetic foot care to  enable safe utilization. (All LTGs Target Date: 06/24/2020)    Time  3    Period  Months    Status  On-going    Target Date  06/24/20      PT LONG TERM GOAL #2   Title  Patient tolerates prosthesis wear >90% of awake hours without skin or residual limb pain to enable function throughout her day.    Time  3    Period  Months    Status  On-going    Target Date  06/24/20      PT LONG TERM GOAL #3   Title  Berg Balance >/= 45/56 to indicate lower fall risk.    Time  3    Period  Months    Status  On-going    Target Date  06/24/20      PT LONG TERM GOAL #4   Title  Patient ambulates 500' outdoors including grass with cane or less & prosthesis modified independent to enable community mobility.    Time  3    Period  Months    Status  On-going    Target Date  06/24/20      PT LONG TERM GOAL #5   Title  Patient negotiates ramps, curbs & stairs with single rail with cane or less & prosthesis modified independent to enable community access.    Time  3    Period  Months    Status  On-going    Target Date  06/24/20      PT LONG TERM GOAL #6   Title  Patient demonstrates skills to enable her to return to gardening including kneeling with UE support like garden kneel bench or chair.    Time  3    Period  Months    Status  On-going    Target Date  06/24/20            Plan - 06/02/20 1509    Clinical Impression Statement  Skilled session continued to focus on extensive education on ply sock adjustment esp now that she is ambulating more with rollator.  Also continue to discuss shoes as she really wants to wear a more "flattering" shoe to church.  Continue to educate that heel needs to be very similar to that of tennis shoe to avoid gait compensations.  Pt verbalized understanding.  Remainder of session focused on balance with emphasis on finding midline, hip extension strength and gait/ramp/curb with rollator.  Pt tolerated well but did have increased R leg pain due to needing to add  socks.    Personal Factors and Comorbidities  Age;Comorbidity 3+;Fitness;Time since onset of injury/illness/exacerbation;Transportation    Comorbidities  Right TTA,  HTN, DM, CVA, PAD, HLD, CKD st2,    Examination-Activity Limitations  Locomotion Level;Stairs;Stand;Transfers;Toileting    Examination-Participation Restrictions  Community Activity;Yard Work;Shop    Stability/Clinical Decision Making  Evolving/Moderate complexity    Rehab Potential  Good    PT Frequency  2x / week    PT Duration  12 weeks    PT Treatment/Interventions  ADLs/Self Care Home Management;DME Instruction;Gait training;Stair training;Functional mobility training;Therapeutic activities;Therapeutic exercise;Balance training;Neuromuscular re-education;Patient/family education;Prosthetic Training;Vestibular    PT Next Visit Plan  check left leg healing & pain, standing balance without UE support, prosthetic gait training with cane in therapy if LLE tolerates; outdoor gait with rollator, including ramps/curb    Consulted and Agree with Plan of Care  Patient       Patient will benefit from skilled therapeutic intervention in order to improve the following deficits and impairments:  Abnormal gait, Decreased activity tolerance, Decreased balance, Decreased endurance, Decreased knowledge of use of DME, Decreased mobility, Decreased skin integrity, Decreased strength, Postural dysfunction, Prosthetic Dependency, Pain  Visit Diagnosis: Other abnormalities of gait and mobility  Unsteadiness on feet  Abnormal posture  Weakness generalized  History of fall     Problem List Patient Active Problem List   Diagnosis Date Noted  . PAD (peripheral artery disease) ([place]) 05/21/2020  . Acute metabolic encephalopathy 17/35/6701  . COVID-19 virus infection 02/08/2020  . Acute kidney injury superimposed on CKD ([place]) 02/08/2020  . Status post below-knee amputation of right lower extremity ([place]) 11/07/2019  . Gangrene of right foot  ([place])   . Hyponatremia 10/29/2019  . Chronic diastolic CHF (congestive heart failure) ([place]) 10/29/2019  . Stable proliferative diabetic retinopathy of both eyes associated with type 2 diabetes mellitus ([place]) 06/05/2018  . Acute CVA (cerebrovascular accident) ([place]) 02/18/2017  . Acute ischemic stroke ([place])   . Internuclear ophthalmoplegia of left eye   . Benign paroxysmal positional vertigo   . Abnormality of gait   . Acute onset of severe vertigo 02/17/2017  . Vertigo 02/17/2017  . Atherosclerosis of native artery of extremity with intermittent claudication ([place]) 03/11/2014  . Diabetic retinopathy ([place]) 03/11/2014  . Retinal hemorrhage due to secondary diabetes ([place]) 03/11/2014  . Type II diabetes mellitus with renal manifestations, uncontrolled ([place]) 03/11/2014  . Chronic hepatitis C without hepatic coma ([place]) 03/11/2014  . Hyperlipidemia 03/11/2014  . Hypoglycemia 04/16/2013  . Disorder of bone and cartilage, unspecified   . Other and unspecified hyperlipidemia   . Essential hypertension, benign   . Atherosclerosis of native artery of extremity ([place])   . Chronic kidney disease (CKD), stage II (mild)   . Peripheral arterial disease ([place])   . Anemia   . Postherpetic neuralgia   . Hypertension     Cameron Sprang, PT, MPT [facility] [address] [place], [place], [address] Phone: [phone]   Fax:  [phone] 06/02/20, 3:12 PM  Name: Monique Cabrera MRN: [id number] Date of Birth: Apr 30, 1935

## 2020-06-03 DIAGNOSIS — I96 Gangrene, not elsewhere classified: Secondary | ICD-10-CM | POA: Diagnosis not present

## 2020-06-03 DIAGNOSIS — I639 Cerebral infarction, unspecified: Secondary | ICD-10-CM | POA: Diagnosis not present

## 2020-06-04 ENCOUNTER — Other Ambulatory Visit: Payer: Self-pay

## 2020-06-04 ENCOUNTER — Ambulatory Visit: Payer: Medicare Other | Admitting: Physical Therapy

## 2020-06-04 ENCOUNTER — Encounter: Payer: Self-pay | Admitting: Physical Therapy

## 2020-06-04 DIAGNOSIS — R531 Weakness: Secondary | ICD-10-CM | POA: Diagnosis not present

## 2020-06-04 DIAGNOSIS — R2689 Other abnormalities of gait and mobility: Secondary | ICD-10-CM

## 2020-06-04 DIAGNOSIS — Z9181 History of falling: Secondary | ICD-10-CM | POA: Diagnosis not present

## 2020-06-04 DIAGNOSIS — M79605 Pain in left leg: Secondary | ICD-10-CM | POA: Diagnosis not present

## 2020-06-04 DIAGNOSIS — M6281 Muscle weakness (generalized): Secondary | ICD-10-CM | POA: Diagnosis not present

## 2020-06-04 DIAGNOSIS — R293 Abnormal posture: Secondary | ICD-10-CM

## 2020-06-04 DIAGNOSIS — R2681 Unsteadiness on feet: Secondary | ICD-10-CM | POA: Diagnosis not present

## 2020-06-04 NOTE — Therapy (Signed)
[facility] [address] [place], [place], [address] Phone: [phone]   Fax:  [phone]  Physical Therapy Treatment  Patient Details  Name: Monique Cabrera MRN: [id number] Date of Birth: 02/15/1935 Referring Provider (PT): Curt Jews, MD   Encounter Date: 06/04/2020  PT End of Session - 06/04/20 1326    Visit Number  16    Number of Visits  25    Date for PT Re-Evaluation  06/24/20    Authorization Type  [employer] Medicare & Medicaid    Progress Note Due on Visit  20    PT Start Time  1320    PT Stop Time  1408    PT Time Calculation (min)  48 min    Equipment Utilized During Treatment  Gait belt    Activity Tolerance  Patient tolerated treatment well;No increased pain    Behavior During Therapy  [facility] for tasks assessed/performed       Past Medical History:  Diagnosis Date   Acute upper respiratory infections of unspecified site    Amputee 08/2019   Anemia    Anemia, unspecified    Arthritis    Atherosclerosis of native arteries of the extremities, unspecified    Chest pain, unspecified    Chronic kidney disease (CKD), stage II (mild)    [facility]   Diabetes mellitus without complication (HCC)    Type II   Diarrhea    Disorder of bone and cartilage, unspecified    Herpes zoster with other nervous system complications(053.19)    History of blood transfusion    Hypercalcemia    Hypertension    Hypertensive renal disease, benign    Nonspecific reaction to tuberculin skin test without active tuberculosis(795.51)    Other and unspecified hyperlipidemia    PAD (peripheral artery disease) ([place])    Per records from [facility]   Pain in joint, lower leg    Peripheral arterial disease ([place])    Postherpetic neuralgia    Proteinuria    Stroke ([place]) 01/2017   Type II or unspecified type diabetes mellitus with renal manifestations, uncontrolled(250.42)    Unspecified disorder of  kidney and ureter     Past Surgical History:  Procedure Laterality Date   ABDOMINAL AORTOGRAM W/LOWER EXTREMITY N/A 10/31/2019   Procedure: ABDOMINAL AORTOGRAM W/LOWER EXTREMITY;  Surgeon: Wellington Hampshire, MD;  Location: [place] [facility];  Service: Cardiovascular;  Laterality: N/A;   ABDOMINAL AORTOGRAM W/LOWER EXTREMITY Left 05/12/2020   Procedure: ABDOMINAL AORTOGRAM W/LOWER EXTREMITY;  Surgeon: Waynetta Sandy, MD;  Location: [place] [facility];  Service: Cardiovascular;  Laterality: Left;   AMPUTATION Right 11/02/2019   Procedure: AMPUTATION BELOW KNEE RIGHT;  Surgeon: Rosetta Posner, MD;  Location: [name] OR;  Service: Vascular;  Laterality: Right;   COLONOSCOPY     x 2   ENDARTERECTOMY FEMORAL Left 05/21/2020   Procedure: LEFT COMMON FEMORAL ENDARTERECTOMY;  Surgeon: Rosetta Posner, MD;  Location: [facility] OR;  Service: Vascular;  Laterality: Left;   FEMORAL-POPLITEAL BYPASS GRAFT Left 05/21/2020   Procedure: LEFT FEMORAL TO BELOW KNEE POPLITEAL ARTERY BYPASS GRAFT;  Surgeon: Rosetta Posner, MD;  Location: [facility] OR;  Service: Vascular;  Laterality: Left;   hysterectomy     INCISION AND DRAINAGE Left 05/27/14   sebacous cyst, ear   PRP Left    Dr. Ricki Miller   removal of cyst from hand     removal of tumor from foot     TONSILLECTOMY  There were no vitals filed for this visit.  Subjective Assessment - 06/04/20 1315    Subjective  She is wearing prosthesis most of awake hours. She is able to donne prosthesis to walk into bathroom first thing.    Limitations  Lifting;Standing;Walking;House hold activities    Patient Stated Goals  To use prosthesis to walk without device, take care of myself, live alone. Go out in community.    Currently in Pain?  No/denies    Pain Onset  In the past 7 days                        [facility] Adult PT Treatment/Exercise - 06/04/20 1315      Transfers   Transfers  Sit to Stand;Stand to Sit    Sit to Stand  5:  Supervision;With upper extremity assist;With armrests    Sit to Stand Details  Verbal cues for sequencing;Verbal cues for technique;Verbal cues for safe use of DME/AE    Stand to Sit  5: Supervision;With upper extremity assist;With armrests;To chair/3-in-1;Other (comment)    Stand to Sit Details (indicate cue type and reason)  Verbal cues for sequencing;Verbal cues for technique;Verbal cues for safe use of DME/AE      Ambulation/Gait   Ambulation/Gait  Yes    Ambulation/Gait Assistance  5: Supervision    Ambulation/Gait Assistance Details  tactile & verbal cues for balance reactions, sequence with cane & wt shift over prosthesis in stance.  Progressed to carrying plate with items then carrying cup of water.     Ambulation Distance (Feet)  200 Feet   arrived / exited with rollator, session with cane   Assistive device  4-wheeled walker;Prosthesis;Straight cane   cane stand alone tip   Gait Pattern  Step-through pattern;Decreased stride length;Decreased stance time - right;Decreased step length - left;Narrow base of support;Trunk flexed    Ambulation Surface  Level;Indoor    Ramp  --    Curb  --      Neuro Re-ed    Neuro Re-ed Details   --      Prosthetics   Prosthetic Care Comments   adjusting ply socks with too few, too many & correct.  Changing shoes with prosthesis.     Current prosthetic wear tolerance (days/week)   daily    Current prosthetic wear tolerance (#hours/day)   donnes upon rising in the am and wears until bedtime, drying as needed.  She does endorse removing leg some days half way through day due to pain.      Current prosthetic weight-bearing tolerance (hours/day)   Pt with R residual limb pain during session that increases during WB    Edema  none    Residual limb condition   residual limb intact with no issues; left LE- scabs over incision with ~2 cm opened area at top of incision. covered with Tegaderm. Pt advised if it opens any more to call the surgeon.     Education  Provided  Skin check;Residual limb care;Care of non-amputated limb;Correct ply sock adjustment;Proper wear schedule/adjustment;Other (comment)   see prosthetic care comments   Person(s) Educated  Patient    Education Method  Explanation;Demonstration;Tactile cues;Verbal cues;Handout    Education Method  Verbalized understanding;Verbal cues required;Needs further instruction    Donning Prosthesis  Supervision    Doffing Prosthesis  Supervision               PT Short Term Goals - 05/28/20 2240  PT SHORT TERM GOAL #1   Title  Patient verbalizes adjusting ply socks with limb volume changes with prosthesis. (All STGs Target Date: 05/23/2020)    Time  1    Period  Months    Status  Achieved    Target Date  05/23/20      PT SHORT TERM GOAL #2   Title  Patient tolerates wear of prosthesis >12 hrs total / day without wound increasing.    Baseline  MET 05/28/2020    Time  1    Period  Months    Status  Achieved    Target Date  05/23/20      PT SHORT TERM GOAL #3   Title  Berg Balance >36/56    Baseline  NOT RETESTED due to surgery 5/26    Time  1    Period  Months    Status  Deferred    Target Date  05/23/20      PT SHORT TERM GOAL #4   Title  Patient ambulates 200' with cane & prosthesis with supervision.    Baseline  Deferred due to surgery on 05/21/2020    Time  1    Period  Months    Status  Deferred    Target Date  05/23/20      PT SHORT TERM GOAL #5   Title  Patient negotiates stairs with single rail, ramps & curbs with cane & prosthesis with minA.    Baseline  deferred due to surgery on 05/21/2020    Time  1    Period  Months    Status  Deferred    Target Date  05/23/20        PT Long Term Goals - 05/07/20 2047      PT LONG TERM GOAL #1   Title  Patient verbalizes & demonstrates understanding of prosthetic care & diabetic foot care to enable safe utilization. (All LTGs Target Date: 06/24/2020)    Time  3    Period  Months    Status  On-going    Target  Date  06/24/20      PT LONG TERM GOAL #2   Title  Patient tolerates prosthesis wear >90% of awake hours without skin or residual limb pain to enable function throughout her day.    Time  3    Period  Months    Status  On-going    Target Date  06/24/20      PT LONG TERM GOAL #3   Title  Berg Balance >/= 45/56 to indicate lower fall risk.    Time  3    Period  Months    Status  On-going    Target Date  06/24/20      PT LONG TERM GOAL #4   Title  Patient ambulates 500' outdoors including grass with cane or less & prosthesis modified independent to enable community mobility.    Time  3    Period  Months    Status  On-going    Target Date  06/24/20      PT LONG TERM GOAL #5   Title  Patient negotiates ramps, curbs & stairs with single rail with cane or less & prosthesis modified independent to enable community access.    Time  3    Period  Months    Status  On-going    Target Date  06/24/20      PT LONG TERM GOAL #6   Title  Patient  demonstrates skills to enable her to return to gardening including kneeling with UE support like garden kneel bench or chair.    Time  3    Period  Months    Status  On-going    Target Date  06/24/20            Plan - 06/04/20 1327    Clinical Impression Statement  PT educated in adjusting ply socks & changing shoes with prosthesis. She appears to have better understanding. PT resumed prosthetic gait with cane working on household tasks including carrying plate or cup.    Personal Factors and Comorbidities  Age;Comorbidity 3+;Fitness;Time since onset of injury/illness/exacerbation;Transportation    Comorbidities  Right TTA, HTN, DM, CVA, PAD, HLD, CKD st2,    Examination-Activity Limitations  Locomotion Level;Stairs;Stand;Transfers;Toileting    Examination-Participation Restrictions  Community Activity;Yard Work;Shop    Stability/Clinical Decision Making  Evolving/Moderate complexity    Rehab Potential  Good    PT Frequency  2x / week    PT  Duration  12 weeks    PT Treatment/Interventions  ADLs/Self Care Home Management;DME Instruction;Gait training;Stair training;Functional mobility training;Therapeutic activities;Therapeutic exercise;Balance training;Neuromuscular re-education;Patient/family education;Prosthetic Training;Vestibular    PT Next Visit Plan  check left leg healing & pain, standing balance without UE support, prosthetic gait training with cane in therapy if LLE tolerates; outdoor gait with rollator, including ramps/curb    Consulted and Agree with Plan of Care  Patient       Patient will benefit from skilled therapeutic intervention in order to improve the following deficits and impairments:  Abnormal gait, Decreased activity tolerance, Decreased balance, Decreased endurance, Decreased knowledge of use of DME, Decreased mobility, Decreased skin integrity, Decreased strength, Postural dysfunction, Prosthetic Dependency, Pain  Visit Diagnosis: Other abnormalities of gait and mobility  Unsteadiness on feet  Abnormal posture  Weakness generalized  History of fall  Pain in left leg     Problem List Patient Active Problem List   Diagnosis Date Noted   PAD (peripheral artery disease) ([place]) 89/21/1941   Acute metabolic encephalopathy 74/07/1447   COVID-19 virus infection 02/08/2020   Acute kidney injury superimposed on CKD ([place]) 02/08/2020   Status post below-knee amputation of right lower extremity ([place]) 11/07/2019   Gangrene of right foot (HCC)    Hyponatremia 10/29/2019   Chronic diastolic CHF (congestive heart failure) ([place]) 10/29/2019   Stable proliferative diabetic retinopathy of both eyes associated with type 2 diabetes mellitus ([place]) 06/05/2018   Acute CVA (cerebrovascular accident) ([place]) 02/18/2017   Acute ischemic stroke (HCC)    Internuclear ophthalmoplegia of left eye    Benign paroxysmal positional vertigo    Abnormality of gait    Acute onset of severe vertigo 02/17/2017    Vertigo 02/17/2017   Atherosclerosis of native artery of extremity with intermittent claudication ([place]) 03/11/2014   Diabetic retinopathy ([place]) 03/11/2014   Retinal hemorrhage due to secondary diabetes ([place]) 03/11/2014   Type II diabetes mellitus with renal manifestations, uncontrolled ([place]) 03/11/2014   Chronic hepatitis C without hepatic coma ([place]) 03/11/2014   Hyperlipidemia 03/11/2014   Hypoglycemia 04/16/2013   Disorder of bone and cartilage, unspecified    Other and unspecified hyperlipidemia    Essential hypertension, benign    Atherosclerosis of native artery of extremity (HCC)    Chronic kidney disease (CKD), stage II (mild)    Peripheral arterial disease (HCC)    Anemia    Postherpetic neuralgia    Hypertension     Jamey Reas PT, DPT 06/04/2020, 2:38 PM  [facility] [address] [place], [place], [address] Phone: [phone]   Fax:  [phone]  Name: Monique Cabrera MRN: [id number] Date of Birth: [birth year]

## 2020-06-04 NOTE — Patient Instructions (Addendum)
[employer] Socks:  1-ply is thin no color at top,  3-ply is yellow at top,  5-ply is green at top  How many ply you need depends on your limb size.  You should have even pressure on your limb when standing & walking.  Guidance points: 1. How ease it goes on? Should be some resistance. Too few it goes on too easily. Too many it takes a lot of work to get it on. 2. How many clicks you get. Especially clicks in sitting. 3. After standing or walking, check knee cap. Bottom should be just under the front lip.  Too few bottom of knee cap sits on indention. Too many bottom is above front lip. 4. Have your feet beside each other & hips over feet. Place hands on your waist. Pelvis Should be level. Too few prosthetic side will be low. Too many prosthetic side will be high.    Get ply socks correct before you leave the house. Take extra socks with you. Take one 3-ply and two 1-ply with you. This is in addition to what you are wearing.   You can change shoes on your prosthesis.  The most critical is using same heel height or pitch of the shoe. You need to know difference between sole of shoe under heel compared to sole under balls of your toes. When you place a new shoe on the prosthesis, the top of the prosthetic foot should set level and the pipe / pylon upright. If you put too tall of heel height on the prosthesis, the pylon will tip forward and buckle the knee.  If you put too short of heel height on the prosthesis, the pylon will tip backwards and push knee into hyperextension for below knee amputations or make knee harder to unlock for above knee prosthesis.  Second is design of shoe toe box. The prosthetic foot will not compress to fit narrower shoes. A curved or square toe box is easier. Also look at depth of the toe box. If too shallow then can be difficult to get prosthesis into the shoe.  Third consideration is how hard the heel is. Most shoes allow some compression as you put weight on the foot. If you put  too stiff or hard heel on prosthesis, it can make the foot move from heel contact to foot flat too fast.  If you put too soft of heel on prosthesis, then the prosthesis may push you (knee) backwards as you sink weight into the prosthesis. Fourth consideration is forefoot coverage or straps. The heel of the prosthesis needs to stay down in the shoe. If the design has open area over the forefoot, then the prosthesis will slide up & down in shoe as you walk. You can have a shoe repairman add a strap across the forefoot (like a Dan Humphreys). You can wear sandals if they have at least 3 straps (one around heel, one across midfoot and one across balls of toes).

## 2020-06-09 ENCOUNTER — Other Ambulatory Visit: Payer: Self-pay

## 2020-06-09 ENCOUNTER — Ambulatory Visit: Payer: Medicare Other | Admitting: Rehabilitation

## 2020-06-09 ENCOUNTER — Encounter: Payer: Self-pay | Admitting: Rehabilitation

## 2020-06-09 DIAGNOSIS — R531 Weakness: Secondary | ICD-10-CM | POA: Diagnosis not present

## 2020-06-09 DIAGNOSIS — R2689 Other abnormalities of gait and mobility: Secondary | ICD-10-CM | POA: Diagnosis not present

## 2020-06-09 DIAGNOSIS — R293 Abnormal posture: Secondary | ICD-10-CM | POA: Diagnosis not present

## 2020-06-09 DIAGNOSIS — R2681 Unsteadiness on feet: Secondary | ICD-10-CM | POA: Diagnosis not present

## 2020-06-09 DIAGNOSIS — Z9181 History of falling: Secondary | ICD-10-CM

## 2020-06-09 DIAGNOSIS — M79605 Pain in left leg: Secondary | ICD-10-CM | POA: Diagnosis not present

## 2020-06-09 DIAGNOSIS — M6281 Muscle weakness (generalized): Secondary | ICD-10-CM | POA: Diagnosis not present

## 2020-06-09 NOTE — Therapy (Signed)
[facility] [address] [place], [place], [address] Phone: [phone]   Fax:  [phone]  Physical Therapy Treatment  Patient Details  Name: Monique Cabrera MRN: [id number] Date of Birth: 10-01-1935 Referring Provider (PT): Curt Jews, MD   Encounter Date: 06/09/2020   PT End of Session - 06/09/20 1509    Visit Number 17    Number of Visits 25    Date for PT Re-Evaluation 06/24/20    Authorization Type [employer]HC Medicare & Medicaid    Progress Note Due on Visit 20    PT Start Time 1332   pt late due to transportation   PT Stop Time 1402    PT Time Calculation (min) 30 min    Equipment Utilized During Treatment Gait belt    Activity Tolerance Patient tolerated treatment well;No increased pain    Behavior During Therapy WFL for tasks assessed/performed           Past Medical History:  Diagnosis Date  . Acute upper respiratory infections of unspecified site   . Amputee 08/2019  . Anemia   . Anemia, unspecified   . Arthritis   . Atherosclerosis of native arteries of the extremities, unspecified   . Chest pain, unspecified   . Chronic kidney disease (CKD), stage II (mild)    [facility]  . Diabetes mellitus without complication ([place])    Type II  . Diarrhea   . Disorder of bone and cartilage, unspecified   . Herpes zoster with other nervous system complications(053.19)   . History of blood transfusion   . Hypercalcemia   . Hypertension   . Hypertensive renal disease, benign   . Nonspecific reaction to tuberculin skin test without active tuberculosis(795.51)   . Other and unspecified hyperlipidemia   . PAD (peripheral artery disease) ([place])    Per records from [facility]  . Pain in joint, lower leg   . Peripheral arterial disease ([place])   . Postherpetic neuralgia   . Proteinuria   . Stroke ([place]) 01/2017  . Type II or unspecified type diabetes mellitus with renal manifestations, uncontrolled(250.42)     . Unspecified disorder of kidney and ureter     Past Surgical History:  Procedure Laterality Date  . ABDOMINAL AORTOGRAM W/LOWER EXTREMITY N/A 10/31/2019   Procedure: ABDOMINAL AORTOGRAM W/LOWER EXTREMITY;  Surgeon: Wellington Hampshire, MD;  Location: [facility];  Service: Cardiovascular;  Laterality: N/A;  . ABDOMINAL AORTOGRAM W/LOWER EXTREMITY Left 05/12/2020   Procedure: ABDOMINAL AORTOGRAM W/LOWER EXTREMITY;  Surgeon: Waynetta Sandy, MD;  Location: [facility];  Service: Cardiovascular;  Laterality: Left;  . AMPUTATION Right 11/02/2019   Procedure: AMPUTATION BELOW KNEE RIGHT;  Surgeon: Rosetta Posner, MD;  Location: [facility];  Service: Vascular;  Laterality: Right;  . COLONOSCOPY     x 2  . ENDARTERECTOMY FEMORAL Left 05/21/2020   Procedure: LEFT COMMON FEMORAL ENDARTERECTOMY;  Surgeon: Rosetta Posner, MD;  Location: [facility];  Service: Vascular;  Laterality: Left;  . FEMORAL-POPLITEAL BYPASS GRAFT Left 05/21/2020   Procedure: LEFT FEMORAL TO BELOW KNEE POPLITEAL ARTERY BYPASS GRAFT;  Surgeon: Rosetta Posner, MD;  Location: [facility];  Service: Vascular;  Laterality: Left;  . hysterectomy    . INCISION AND DRAINAGE Left 05/27/14   sebacous cyst, ear  . PRP Left    Dr. Ricki Miller  . removal of cyst from hand    . removal of tumor from foot    . TONSILLECTOMY  There were no vitals filed for this visit.   Subjective Assessment - 06/09/20 1509    Subjective Pt reports being late due to transportation, no other changes.    Patient Stated Goals To use prosthesis to walk without device, take care of myself, live alone. Go out in community.    Currently in Pain? No/denies   has some pain at end of session, did not rate                            [facility] Adult PT Treatment/Exercise - 06/09/20 1345      Transfers   Transfers Sit to Stand;Stand to Sit    Sit to Stand 6: Modified independent (Device/Increase time)    Stand to Sit 6: Modified independent  (Device/Increase time)      Ambulation/Gait   Ambulation/Gait Yes    Ambulation/Gait Assistance 5: Supervision;4: Min assist    Ambulation/Gait Assistance Details Pt ambulatory into clinic with rollator at S to mod I level.  Min cues for posture and step through.  Pt verbalizing that she intermittently ambulates at home without device as she does not know how to use cane (has been learning last session).  PT continued to recommend rollator in house for max safety but is okay to use cane when family is there with her.  She still reports she may use cane by herself at home to increase independence, however PT educated on pt being very high fall risk and would not recommend this at this time.  Pt verbalized understanding.  Pt also asking about different types of canes.  She reports she has SPC at home but is interested in "hurry" cane vs quad tip cane.  We trialed use of quad tip cane first x 115' with min/guard to min A with cues for upright posture and increased B hip and knee extension in stance phase of gait.  Note that if pt slightly distracted or needing to make a turn she tends to have immediate LOB with assist needed to correct.  She also does this with the hurry cane.  Same cues for posture and knee/hip extension.  Also note increasing pain with gait during session.  Pt likely needs more socks, however did not bring any with her an only has 1 ply sock on during session.  Continue to educate on need to bring them with her.  PT educated on how to get either quad tip or hurry cane.  Hurry cane was preferred and upon research was cheaper from [employer].  Pt reports daughter can get this for her.      Ambulation Distance (Feet) 115 Feet   then 125' to include turns, tight spaces    Assistive device 4-wheeled walker;Straight cane   quad tip vs hurry cane    Gait Pattern Step-through pattern;Decreased stride length;Decreased stance time - right;Decreased step length - left;Narrow base of support;Trunk flexed     Ambulation Surface Level;Indoor      Prosthetics   Prosthetic Care Comments  adjusting ply socks with too few, too many & correct.  Changing shoes with prosthesis.     Current prosthetic wear tolerance (days/week)  daily    Current prosthetic wear tolerance (#hours/day)  donnes upon rising in the am and wears until bedtime, drying as needed.  She does endorse removing leg some days half way through day due to pain.      Current prosthetic weight-bearing tolerance (hours/day)  Pt with R  residual limb pain during session that increases during WB    Edema none    Residual limb condition  residual limb intact, wound on L closing, is to see MD for follow up tomorrow     Education Provided Skin check;Residual limb care;Correct ply sock adjustment;Proper wear schedule/adjustment    Person(s) Educated Patient    Education Method Explanation;Verbal cues    Education Method Verbalized understanding;Needs further instruction    Donning Prosthesis Supervision    Doffing Prosthesis Supervision                    PT Short Term Goals - 05/28/20 2240      PT SHORT TERM GOAL #1   Title Patient verbalizes adjusting ply socks with limb volume changes with prosthesis. (All STGs Target Date: 05/23/2020)    Time 1    Period Months    Status Achieved    Target Date 05/23/20      PT SHORT TERM GOAL #2   Title Patient tolerates wear of prosthesis >12 hrs total / day without wound increasing.    Baseline MET 05/28/2020    Time 1    Period Months    Status Achieved    Target Date 05/23/20      PT SHORT TERM GOAL #3   Title Berg Balance >36/56    Baseline NOT RETESTED due to surgery 5/26    Time 1    Period Months    Status Deferred    Target Date 05/23/20      PT SHORT TERM GOAL #4   Title Patient ambulates 200' with cane & prosthesis with supervision.    Baseline Deferred due to surgery on 05/21/2020    Time 1    Period Months    Status Deferred    Target Date 05/23/20      PT SHORT  TERM GOAL #5   Title Patient negotiates stairs with single rail, ramps & curbs with cane & prosthesis with minA.    Baseline deferred due to surgery on 05/21/2020    Time 1    Period Months    Status Deferred    Target Date 05/23/20             PT Long Term Goals - 05/07/20 2047      PT LONG TERM GOAL #1   Title Patient verbalizes & demonstrates understanding of prosthetic care & diabetic foot care to enable safe utilization. (All LTGs Target Date: 06/24/2020)    Time 3    Period Months    Status On-going    Target Date 06/24/20      PT LONG TERM GOAL #2   Title Patient tolerates prosthesis wear >90% of awake hours without skin or residual limb pain to enable function throughout her day.    Time 3    Period Months    Status On-going    Target Date 06/24/20      PT LONG TERM GOAL #3   Title Berg Balance >/= 45/56 to indicate lower fall risk.    Time 3    Period Months    Status On-going    Target Date 06/24/20      PT LONG TERM GOAL #4   Title Patient ambulates 500' outdoors including grass with cane or less & prosthesis modified independent to enable community mobility.    Time 3    Period Months    Status On-going    Target Date 06/24/20  PT LONG TERM GOAL #5   Title Patient negotiates ramps, curbs & stairs with single rail with cane or less & prosthesis modified independent to enable community access.    Time 3    Period Months    Status On-going    Target Date 06/24/20      PT LONG TERM GOAL #6   Title Patient demonstrates skills to enable her to return to gardening including kneeling with UE support like garden kneel bench or chair.    Time 3    Period Months    Status On-going    Target Date 06/24/20                 Plan - 06/09/20 1510    Clinical Impression Statement Pt continues to need ongoing education on ply sock adjustment and bring socks with her.  PT continued with gait training with quad tip cane vs hurry cane per pt request.  She  reports she is ambulating a lot of times at home without device.  Due to high fall risk would recommend rollator, or cane with family.  She is going to have daughter get her a hurrycane as able.    Personal Factors and Comorbidities Age;Comorbidity 3+;Fitness;Time since onset of injury/illness/exacerbation;Transportation    Comorbidities Right TTA, HTN, DM, CVA, PAD, HLD, CKD st2,    Examination-Activity Limitations Locomotion Level;Stairs;Stand;Transfers;Toileting    Examination-Participation Restrictions Community Activity;Yard Work;Shop    Stability/Clinical Decision Making Evolving/Moderate complexity    Rehab Potential Good    PT Frequency 2x / week    PT Duration 12 weeks    PT Treatment/Interventions ADLs/Self Care Home Management;DME Instruction;Gait training;Stair training;Functional mobility training;Therapeutic activities;Therapeutic exercise;Balance training;Neuromuscular re-education;Patient/family education;Prosthetic Training;Vestibular    PT Next Visit Plan Robin-she is ambulating at home without AD at times, so we really need to work on turns and things with cane as she is such a high fall risk with cane still, standing balance without UE support, prosthetic gait training with cane in therapy if LLE tolerates; outdoor gait with rollator, including ramps/curb    Consulted and Agree with Plan of Care Patient           Patient will benefit from skilled therapeutic intervention in order to improve the following deficits and impairments:  Abnormal gait, Decreased activity tolerance, Decreased balance, Decreased endurance, Decreased knowledge of use of DME, Decreased mobility, Decreased skin integrity, Decreased strength, Postural dysfunction, Prosthetic Dependency, Pain  Visit Diagnosis: Other abnormalities of gait and mobility  Unsteadiness on feet  Abnormal posture  Weakness generalized  History of fall     Problem List Patient Active Problem List   Diagnosis Date  Noted  . PAD (peripheral artery disease) ([place]) 05/21/2020  . Acute metabolic encephalopathy 76/54/6503  . COVID-19 virus infection 02/08/2020  . Acute kidney injury superimposed on CKD ([place]) 02/08/2020  . Status post below-knee amputation of right lower extremity ([place]) 11/07/2019  . Gangrene of right foot ([place])   . Hyponatremia 10/29/2019  . Chronic diastolic CHF (congestive heart failure) ([place]) 10/29/2019  . Stable proliferative diabetic retinopathy of both eyes associated with type 2 diabetes mellitus ([place]) 06/05/2018  . Acute CVA (cerebrovascular accident) ([place]) 02/18/2017  . Acute ischemic stroke ([place])   . Internuclear ophthalmoplegia of left eye   . Benign paroxysmal positional vertigo   . Abnormality of gait   . Acute onset of severe vertigo 02/17/2017  . Vertigo 02/17/2017  . Atherosclerosis of native artery of extremity with intermittent claudication ([place]) 03/11/2014  .  Diabetic retinopathy ([place]) 03/11/2014  . Retinal hemorrhage due to secondary diabetes ([place]) 03/11/2014  . Type II diabetes mellitus with renal manifestations, uncontrolled ([place]) 03/11/2014  . Chronic hepatitis C without hepatic coma ([place]) 03/11/2014  . Hyperlipidemia 03/11/2014  . Hypoglycemia 04/16/2013  . Disorder of bone and cartilage, unspecified   . Other and unspecified hyperlipidemia   . Essential hypertension, benign   . Atherosclerosis of native artery of extremity ([place])   . Chronic kidney disease (CKD), stage II (mild)   . Peripheral arterial disease ([place])   . Anemia   . Postherpetic neuralgia   . Hypertension     Cameron Sprang, PT, MPT [name] [address] [place], [place], [address] Phone: [phone]   Fax:  [phone] 06/09/20, 3:14 PM  Name: Monique Cabrera MRN: [id number] Date of Birth: 26-Jun-1935

## 2020-06-10 ENCOUNTER — Ambulatory Visit (INDEPENDENT_AMBULATORY_CARE_PROVIDER_SITE_OTHER): Payer: Self-pay | Admitting: Vascular Surgery

## 2020-06-10 ENCOUNTER — Encounter: Payer: Self-pay | Admitting: Vascular Surgery

## 2020-06-10 VITALS — BP 153/78 | HR 69 | Temp 97.5°F | Resp 20 | Ht 65.0 in | Wt 131.7 lb

## 2020-06-10 DIAGNOSIS — Z89511 Acquired absence of right leg below knee: Secondary | ICD-10-CM

## 2020-06-10 DIAGNOSIS — I70229 Atherosclerosis of native arteries of extremities with rest pain, unspecified extremity: Secondary | ICD-10-CM

## 2020-06-10 DIAGNOSIS — I998 Other disorder of circulatory system: Secondary | ICD-10-CM

## 2020-06-10 NOTE — Progress Notes (Signed)
Patient name: Monique Cabrera MRN: [id number] DOB: 1935/12/14 Sex: female  REASON FOR VISIT: Follow-up left femoral endarterectomy and left femoral to below-knee popliteal bypass with Gore-Tex for critical limb ischemia  HPI: Monique Cabrera is a 85 y.o. female here today for follow-up.  She underwent the above procedure on 05/21/2020.  She had nonhealing ulceration of the tip of her left second toe.  She did very well in the hospital and was discharged home.  Current Outpatient Medications  Medication Sig Dispense Refill  . amLODipine (NORVASC) 10 MG tablet TAKE 1 TABLET (10 MG TOTAL) BY MOUTH DAILY. FOR HIGH BLOOD PRESSURE 90 tablet 1  . ASPIRIN LOW DOSE 81 MG EC tablet Take 81 mg by mouth daily.    Marland Kitchen atorvastatin (LIPITOR) 20 MG tablet TAKE 1 TABLET BY MOUTH EVERY DAY (Patient taking differently: Take 20 mg by mouth daily. ) 90 tablet 1  . carvedilol (COREG) 25 MG tablet TAKE 1 TABLET BY MOUTH TWICE A DAY WITH MEALS (Patient taking differently: Take 25 mg by mouth in the morning and at bedtime. ) 180 tablet 1  . clopidogrel (PLAVIX) 75 MG tablet TAKE 1 TABLET BY MOUTH EVERY DAY 90 tablet 1  . feeding supplement, ENSURE ENLIVE, (ENSURE ENLIVE) LIQD Take 237 mLs by mouth 3 (three) times daily between meals. 237 mL 12  . feeding supplement, GLUCERNA SHAKE, (GLUCERNA SHAKE) LIQD Take 237 mLs by mouth 3 (three) times daily between meals.  0  . glucose 4 GM chewable tablet Chew 1 tablet (4 g total) by mouth as needed for low blood sugar. 50 tablet 12  . insulin lispro (HUMALOG) 100 UNIT/ML injection Inject 0.03 mLs (3 Units total) into the skin 3 (three) times daily after meals. 10 mL 11  . Insulin Pen Needle (B-D ULTRAFINE III SHORT PEN) 31G X 8 MM MISC Use to check blood sugar every day. Dx: 11.29; 11.65 100 each 1  . LANTUS SOLOSTAR 100 UNIT/ML Solostar Pen Inject 20 Units into the skin daily.    . Multiple Vitamin (MULTIVITAMIN WITH MINERALS) TABS tablet Take  1 tablet by mouth daily.    Monique Cabrera VERIO test strip USE TO TEST BLOOD SUGAR THREE TIMES DAILY. DX: E11.9 300 strip 3  . gabapentin (NEURONTIN) 100 MG capsule Take 100 mg by mouth 3 (three) times daily as needed (pain).  (Patient not taking: Reported on 06/10/2020)    . HYDROcodone-acetaminophen (NORCO/VICODIN) 5-325 MG tablet Take 1 tablet by mouth every 6 (six) hours as needed for moderate pain. (Patient not taking: Reported on 06/10/2020) 20 tablet 0   No current facility-administered medications for this visit.     PHYSICAL EXAM: Vitals:   06/10/20 1131  BP: (!) 153/78  Pulse: 69  Resp: 20  Temp: (!) 97.5 F (36.4 C)  TempSrc: Temporal  SpO2: 98%  Weight: 131 lb 11.2 oz (59.7 kg)  Height: 5\' 5"  (1.651 m)    GENERAL: The patient is a well-nourished female, in no acute distress. The vital signs are documented above. Well-healed femoral and below-knee popliteal incision.  Easily palpable femoral pulse.  Biphasic signals at the posterior tibial, anterior tibial and peroneal level of the foot.  Her eschar has separated and removed with a near complete healing of the tip of her left second toe  MEDICAL ISSUES: Stable overall.  We will continue her walking program.  She does report that she is having pain in her below-knee amputation stump on the right when she wears her prosthesis  for any period of time.  She is somewhat frustrated and that she is getting a different message from physical therapy and [employer].  I have asked her to be persistent with me [employer] [facility] team.  She has done very good job of walking with her BK prosthesis.  We will see her again in 3 months for noninvasive studies   Monique Posner, MD [facility] Tel [phone] Pager [phone]

## 2020-06-11 ENCOUNTER — Other Ambulatory Visit: Payer: Self-pay

## 2020-06-11 ENCOUNTER — Encounter: Payer: Self-pay | Admitting: Physical Therapy

## 2020-06-11 ENCOUNTER — Ambulatory Visit: Payer: Medicare Other | Admitting: Physical Therapy

## 2020-06-11 DIAGNOSIS — R2681 Unsteadiness on feet: Secondary | ICD-10-CM

## 2020-06-11 DIAGNOSIS — M79605 Pain in left leg: Secondary | ICD-10-CM | POA: Diagnosis not present

## 2020-06-11 DIAGNOSIS — R2689 Other abnormalities of gait and mobility: Secondary | ICD-10-CM | POA: Diagnosis not present

## 2020-06-11 DIAGNOSIS — Z9181 History of falling: Secondary | ICD-10-CM | POA: Diagnosis not present

## 2020-06-11 DIAGNOSIS — R293 Abnormal posture: Secondary | ICD-10-CM

## 2020-06-11 DIAGNOSIS — M6281 Muscle weakness (generalized): Secondary | ICD-10-CM | POA: Diagnosis not present

## 2020-06-11 DIAGNOSIS — R531 Weakness: Secondary | ICD-10-CM

## 2020-06-11 NOTE — Therapy (Signed)
[facility] [address] [place], [place], [address] Phone: [phone]   Fax:  [phone]  Physical Therapy Treatment  Patient Details  Name: Monique Cabrera MRN: [id number] Date of Birth: 1935/10/15 Referring Provider (PT): Curt Jews, MD   Encounter Date: 06/11/2020   PT End of Session - 06/11/20 1423    Visit Number 18    Number of Visits 25    Date for PT Re-Evaluation 06/24/20    Authorization Type UHC Medicare & Medicaid    Progress Note Due on Visit 20    PT Start Time 1326    PT Stop Time 1410    PT Time Calculation (min) 44 min    Equipment Utilized During Treatment Gait belt    Activity Tolerance Patient tolerated treatment well;No increased pain    Behavior During Therapy WFL for tasks assessed/performed           Past Medical History:  Diagnosis Date  . Acute upper respiratory infections of unspecified site   . Amputee 08/2019  . Anemia   . Anemia, unspecified   . Arthritis   . Atherosclerosis of native arteries of the extremities, unspecified   . Chest pain, unspecified   . Chronic kidney disease (CKD), stage II (mild)    [facility]  . Diabetes mellitus without complication ([place])    Type II  . Diarrhea   . Disorder of bone and cartilage, unspecified   . Herpes zoster with other nervous system complications(053.19)   . History of blood transfusion   . Hypercalcemia   . Hypertension   . Hypertensive renal disease, benign   . Nonspecific reaction to tuberculin skin test without active tuberculosis(795.51)   . Other and unspecified hyperlipidemia   . PAD (peripheral artery disease) ([place])    Per records from [facility]  . Pain in joint, lower leg   . Peripheral arterial disease ([place])   . Postherpetic neuralgia   . Proteinuria   . Stroke ([place]) 01/2017  . Type II or unspecified type diabetes mellitus with renal manifestations, uncontrolled(250.42)   . Unspecified disorder of  kidney and ureter     Past Surgical History:  Procedure Laterality Date  . ABDOMINAL AORTOGRAM W/LOWER EXTREMITY N/A 10/31/2019   Procedure: ABDOMINAL AORTOGRAM W/LOWER EXTREMITY;  Surgeon: Wellington Hampshire, MD;  Location: [facility];  Service: Cardiovascular;  Laterality: N/A;  . ABDOMINAL AORTOGRAM W/LOWER EXTREMITY Left 05/12/2020   Procedure: ABDOMINAL AORTOGRAM W/LOWER EXTREMITY;  Surgeon: Waynetta Sandy, MD;  Location: [facility];  Service: Cardiovascular;  Laterality: Left;  . AMPUTATION Right 11/02/2019   Procedure: AMPUTATION BELOW KNEE RIGHT;  Surgeon: Rosetta Posner, MD;  Location: [facility];  Service: Vascular;  Laterality: Right;  . COLONOSCOPY     x 2  . ENDARTERECTOMY FEMORAL Left 05/21/2020   Procedure: LEFT COMMON FEMORAL ENDARTERECTOMY;  Surgeon: Rosetta Posner, MD;  Location: [facility];  Service: Vascular;  Laterality: Left;  . FEMORAL-POPLITEAL BYPASS GRAFT Left 05/21/2020   Procedure: LEFT FEMORAL TO BELOW KNEE POPLITEAL ARTERY BYPASS GRAFT;  Surgeon: Rosetta Posner, MD;  Location: [facility];  Service: Vascular;  Laterality: Left;  . hysterectomy    . INCISION AND DRAINAGE Left 05/27/14   sebacous cyst, ear  . PRP Left    Dr. Ricki Miller  . removal of cyst from hand    . removal of tumor from foot    . TONSILLECTOMY      There were  no vitals filed for this visit.   Subjective Assessment - 06/11/20 1327    Subjective she saw Dr. Donnetta Hutching this week and left foot is healing from surgery. She feels like PT & [employer] are saying different things.    Patient Stated Goals To use prosthesis to walk without device, take care of myself, live alone. Go out in community.    Currently in Pain? No/denies                             [facility] Adult PT Treatment/Exercise - 06/11/20 1330      Transfers   Transfers Sit to Stand;Stand to Sit    Sit to Stand 6: Modified independent (Device/Increase time)    Stand to Sit 6: Modified independent (Device/Increase  time)      Ambulation/Gait   Ambulation/Gait Yes    Ambulation/Gait Assistance 5: Supervision;4: Min assist    Ambulation Distance (Feet) 115 Feet   then 125' to include turns, tight spaces    Assistive device 4-wheeled walker;Straight cane   quad tip vs hurry cane    Gait Pattern Step-through pattern;Decreased stride length;Decreased stance time - right;Decreased step length - left;Narrow base of support;Trunk flexed    Ambulation Surface Level;Indoor      Knee/Hip Exercises: Standing   Hip Flexion Stengthening;Right;Left;10 reps;Knee straight   BUE support   Hip Flexion Limitations red theraband cues for controlled motion including eccentric    Terminal Knee Extension Strengthening;Right;10 reps;3 sets;Theraband   BUE support upright posture   Theraband Level (Terminal Knee Extension) Level 2 (Red)    Terminal Knee Extension Limitations bilateral stance, initial contact position & terminal stance position    Hip ADduction Strengthening;Right;Left;10 reps   BUE support   Hip ADduction Limitations red theraband cues for controlled motion including eccentric    Hip Abduction Stengthening;Right;Left;10 reps;Knee straight   BUE support   Abduction Limitations red theraband cues for controlled motion including eccentric    Hip Extension Stengthening;Right;Left;10 reps;Knee straight   BUE support   Extension Limitations red theraband cues for controlled motion including eccentric      Prosthetics   Prosthetic Care Comments  new shoes have heel ~ 1/4" than toe causing flexion moment in prosthetic knee.  PT put 1/4" in forefoot of shoe.  Prosthetist can not pads in socket if wearing less than 3-ply any time during day as pads would make socket too tight.     Current prosthetic wear tolerance (days/week)  daily    Current prosthetic wear tolerance (#hours/day)  donnes upon rising in the am and wears until bedtime, drying as needed.  She does endorse removing leg some days half way through day due  to pain.      Current prosthetic weight-bearing tolerance (hours/day)  PT educated when feels increased pressure on distal limb as probably has pumped fluid from limb with weight bearing.     Edema none    Residual limb condition  residual limb intact, wound on L closing, is to see MD for follow up tomorrow     Education Provided Skin check;Residual limb care;Correct ply sock adjustment;Proper wear schedule/adjustment    Person(s) Educated Patient    Education Method Explanation;Verbal cues;Demonstration;Tactile cues    Education Method Verbalized understanding;Needs further instruction;Verbal cues required    Donning Prosthesis Supervision    Doffing Prosthesis Modified independent (device/increased time)  PT Short Term Goals - 05/28/20 2240      PT SHORT TERM GOAL #1   Title Patient verbalizes adjusting ply socks with limb volume changes with prosthesis. (All STGs Target Date: 05/23/2020)    Time 1    Period Months    Status Achieved    Target Date 05/23/20      PT SHORT TERM GOAL #2   Title Patient tolerates wear of prosthesis >12 hrs total / day without wound increasing.    Baseline MET 05/28/2020    Time 1    Period Months    Status Achieved    Target Date 05/23/20      PT SHORT TERM GOAL #3   Title Berg Balance >36/56    Baseline NOT RETESTED due to surgery 5/26    Time 1    Period Months    Status Deferred    Target Date 05/23/20      PT SHORT TERM GOAL #4   Title Patient ambulates 200' with cane & prosthesis with supervision.    Baseline Deferred due to surgery on 05/21/2020    Time 1    Period Months    Status Deferred    Target Date 05/23/20      PT SHORT TERM GOAL #5   Title Patient negotiates stairs with single rail, ramps & curbs with cane & prosthesis with minA.    Baseline deferred due to surgery on 05/21/2020    Time 1    Period Months    Status Deferred    Target Date 05/23/20             PT Long Term Goals - 05/07/20  2047      PT LONG TERM GOAL #1   Title Patient verbalizes & demonstrates understanding of prosthetic care & diabetic foot care to enable safe utilization. (All LTGs Target Date: 06/24/2020)    Time 3    Period Months    Status On-going    Target Date 06/24/20      PT LONG TERM GOAL #2   Title Patient tolerates prosthesis wear >90% of awake hours without skin or residual limb pain to enable function throughout her day.    Time 3    Period Months    Status On-going    Target Date 06/24/20      PT LONG TERM GOAL #3   Title Berg Balance >/= 45/56 to indicate lower fall risk.    Time 3    Period Months    Status On-going    Target Date 06/24/20      PT LONG TERM GOAL #4   Title Patient ambulates 500' outdoors including grass with cane or less & prosthesis modified independent to enable community mobility.    Time 3    Period Months    Status On-going    Target Date 06/24/20      PT LONG TERM GOAL #5   Title Patient negotiates ramps, curbs & stairs with single rail with cane or less & prosthesis modified independent to enable community access.    Time 3    Period Months    Status On-going    Target Date 06/24/20      PT LONG TERM GOAL #6   Title Patient demonstrates skills to enable her to return to gardening including kneeling with UE support like garden kneel bench or chair.    Time 3    Period Months    Status On-going  Target Date 06/24/20                 Plan - 06/11/20 1424    Clinical Impression Statement Patient is probably going to need recertification at end of plan of care to maximize her function & potential with prosthesis.  PT added standing resistance band LE exercises today. PT educated patient on new shoes were pitching prosthetic foot forward resulting in knee flexion moment in stance.    Personal Factors and Comorbidities Age;Comorbidity 3+;Fitness;Time since onset of injury/illness/exacerbation;Transportation    Comorbidities Right TTA, HTN, DM,  CVA, PAD, HLD, CKD st2,    Examination-Activity Limitations Locomotion Level;Stairs;Stand;Transfers;Toileting    Examination-Participation Restrictions Community Activity;Yard Work;Shop    Stability/Clinical Decision Making Evolving/Moderate complexity    Rehab Potential Good    PT Frequency 2x / week    PT Duration 12 weeks    PT Treatment/Interventions ADLs/Self Care Home Management;DME Instruction;Gait training;Stair training;Functional mobility training;Therapeutic activities;Therapeutic exercise;Balance training;Neuromuscular re-education;Patient/family education;Prosthetic Training;Vestibular    PT Next Visit Plan begin to check LTGs with plan to recertify on 6/38 PT appt, work on prosthetic gait with cane including carrying items around furniture, continue standing LE theraband exercises.    Consulted and Agree with Plan of Care Patient           Patient will benefit from skilled therapeutic intervention in order to improve the following deficits and impairments:  Abnormal gait, Decreased activity tolerance, Decreased balance, Decreased endurance, Decreased knowledge of use of DME, Decreased mobility, Decreased skin integrity, Decreased strength, Postural dysfunction, Prosthetic Dependency, Pain  Visit Diagnosis: Other abnormalities of gait and mobility  Unsteadiness on feet  Abnormal posture  Weakness generalized  History of fall  Pain in left leg     Problem List Patient Active Problem List   Diagnosis Date Noted  . PAD (peripheral artery disease) ([place]) 05/21/2020  . Acute metabolic encephalopathy 75/64/3329  . COVID-19 virus infection 02/08/2020  . Acute kidney injury superimposed on CKD ([place]) 02/08/2020  . Status post below-knee amputation of right lower extremity ([place]) 11/07/2019  . Gangrene of right foot ([place])   . Hyponatremia 10/29/2019  . Chronic diastolic CHF (congestive heart failure) ([place]) 10/29/2019  . Stable proliferative diabetic retinopathy of both  eyes associated with type 2 diabetes mellitus ([place]) 06/05/2018  . Acute CVA (cerebrovascular accident) ([place]) 02/18/2017  . Acute ischemic stroke ([place])   . Internuclear ophthalmoplegia of left eye   . Benign paroxysmal positional vertigo   . Abnormality of gait   . Acute onset of severe vertigo 02/17/2017  . Vertigo 02/17/2017  . Atherosclerosis of native artery of extremity with intermittent claudication ([place]) 03/11/2014  . Diabetic retinopathy ([place]) 03/11/2014  . Retinal hemorrhage due to secondary diabetes ([place]) 03/11/2014  . Type II diabetes mellitus with renal manifestations, uncontrolled ([place]) 03/11/2014  . Chronic hepatitis C without hepatic coma ([place]) 03/11/2014  . Hyperlipidemia 03/11/2014  . Hypoglycemia 04/16/2013  . Disorder of bone and cartilage, unspecified   . Other and unspecified hyperlipidemia   . Essential hypertension, benign   . Atherosclerosis of native artery of extremity ([place])   . Chronic kidney disease (CKD), stage II (mild)   . Peripheral arterial disease ([place])   . Anemia   . Postherpetic neuralgia   . Hypertension     Jamey Reas PT, DPT 06/11/2020, 2:31 PM  [facility] [address] [place], [place], [address] Phone: [phone]   Fax:  [phone]  Name: Monique Cabrera MRN:  [id number] Date of Birth: August 26, 1935

## 2020-06-12 ENCOUNTER — Other Ambulatory Visit: Payer: Self-pay | Admitting: *Deleted

## 2020-06-12 DIAGNOSIS — I739 Peripheral vascular disease, unspecified: Secondary | ICD-10-CM

## 2020-06-16 ENCOUNTER — Other Ambulatory Visit: Payer: Self-pay

## 2020-06-16 ENCOUNTER — Ambulatory Visit: Payer: Medicare Other

## 2020-06-16 DIAGNOSIS — R2681 Unsteadiness on feet: Secondary | ICD-10-CM

## 2020-06-16 DIAGNOSIS — Z9181 History of falling: Secondary | ICD-10-CM | POA: Diagnosis not present

## 2020-06-16 DIAGNOSIS — M6281 Muscle weakness (generalized): Secondary | ICD-10-CM | POA: Diagnosis not present

## 2020-06-16 DIAGNOSIS — R2689 Other abnormalities of gait and mobility: Secondary | ICD-10-CM | POA: Diagnosis not present

## 2020-06-16 DIAGNOSIS — R531 Weakness: Secondary | ICD-10-CM | POA: Diagnosis not present

## 2020-06-16 DIAGNOSIS — M79605 Pain in left leg: Secondary | ICD-10-CM | POA: Diagnosis not present

## 2020-06-16 DIAGNOSIS — R293 Abnormal posture: Secondary | ICD-10-CM | POA: Diagnosis not present

## 2020-06-16 NOTE — Therapy (Signed)
[facility] [address] [place], [place], [address] Phone: [phone]   Fax:  [phone]  Physical Therapy Treatment  Patient Details  Name: Monique Cabrera MRN: [id number] Date of Birth: 1935-11-17 Referring Provider (PT): Curt Jews, MD   Encounter Date: 06/16/2020   PT End of Session - 06/16/20 1321    Visit Number 19    Number of Visits 25    Date for PT Re-Evaluation 06/24/20    Authorization Type [employer] Medicare & Medicaid    Progress Note Due on Visit 20    PT Start Time 1319    PT Stop Time 1403    PT Time Calculation (min) 44 min    Equipment Utilized During Treatment Gait belt    Activity Tolerance Patient tolerated treatment well;No increased pain    Behavior During Therapy WFL for tasks assessed/performed           Past Medical History:  Diagnosis Date  . Acute upper respiratory infections of unspecified site   . Amputee 08/2019  . Anemia   . Anemia, unspecified   . Arthritis   . Atherosclerosis of native arteries of the extremities, unspecified   . Chest pain, unspecified   . Chronic kidney disease (CKD), stage II (mild)    [facility]  . Diabetes mellitus without complication ([place])    Type II  . Diarrhea   . Disorder of bone and cartilage, unspecified   . Herpes zoster with other nervous system complications(053.19)   . History of blood transfusion   . Hypercalcemia   . Hypertension   . Hypertensive renal disease, benign   . Nonspecific reaction to tuberculin skin test without active tuberculosis(795.51)   . Other and unspecified hyperlipidemia   . PAD (peripheral artery disease) ([place])    Per records from [facility]  . Pain in joint, lower leg   . Peripheral arterial disease ([place])   . Postherpetic neuralgia   . Proteinuria   . Stroke ([place]) 01/2017  . Type II or unspecified type diabetes mellitus with renal manifestations, uncontrolled(250.42)   . Unspecified disorder of  kidney and ureter     Past Surgical History:  Procedure Laterality Date  . ABDOMINAL AORTOGRAM W/LOWER EXTREMITY N/A 10/31/2019   Procedure: ABDOMINAL AORTOGRAM W/LOWER EXTREMITY;  Surgeon: Wellington Hampshire, MD;  Location: [facility];  Service: Cardiovascular;  Laterality: N/A;  . ABDOMINAL AORTOGRAM W/LOWER EXTREMITY Left 05/12/2020   Procedure: ABDOMINAL AORTOGRAM W/LOWER EXTREMITY;  Surgeon: Waynetta Sandy, MD;  Location: [facility];  Service: Cardiovascular;  Laterality: Left;  . AMPUTATION Right 11/02/2019   Procedure: AMPUTATION BELOW KNEE RIGHT;  Surgeon: Rosetta Posner, MD;  Location: [facility];  Service: Vascular;  Laterality: Right;  . COLONOSCOPY     x 2  . ENDARTERECTOMY FEMORAL Left 05/21/2020   Procedure: LEFT COMMON FEMORAL ENDARTERECTOMY;  Surgeon: Rosetta Posner, MD;  Location: [facility];  Service: Vascular;  Laterality: Left;  . FEMORAL-POPLITEAL BYPASS GRAFT Left 05/21/2020   Procedure: LEFT FEMORAL TO BELOW KNEE POPLITEAL ARTERY BYPASS GRAFT;  Surgeon: Rosetta Posner, MD;  Location: [facility];  Service: Vascular;  Laterality: Left;  . hysterectomy    . INCISION AND DRAINAGE Left 05/27/14   sebacous cyst, ear  . PRP Left    Dr. Ricki Miller  . removal of cyst from hand    . removal of tumor from foot    . TONSILLECTOMY      There were  no vitals filed for this visit.   Subjective Assessment - 06/16/20 1321    Subjective Pt reports that she continues to have pain in residual limb. Has been trying the different ply socks.Started with 4ply this morning then down to 2ply now none as she could hardly get prosthetic leg on. Is going to talk to the prosthesist. She reports it will sometimes feel a little better here but doesn't last. She is also still having some issues with getting to bathroom when she gets up around 3am. Can not get leg on quick enough to donn for that and then go right back to bed. She is scooting on bottom now versus knee. Daughter did buy her a knee  scooter but it is difficult to turn.    Patient Stated Goals To use prosthesis to walk without device, take care of myself, live alone. Go out in community.    Currently in Pain? Yes    Pain Score 0-No pain   6/10 with walking   Pain Location Leg    Pain Orientation Right               Prosthetics Assessment - 06/16/20 1726      Prosthetics   Prosthetic Care Comments  PT reviewed when to adjust socks to help with fit as her fluid fluctuates throughout the day. Pt reported starting with 4 ply this morning then decreased to 2ply now at none as she was having increased pain and found she could hardly get prosthesis on. Explained again to pt that fluid levels can vary throughout the day as pressure from prosthesis pumps fluid out of limb.  Pt unable to get click from pin when sitting and donning prosthesis. Had to stand to get clicks with multiple noted.    Donning prosthesis  Modified independent (Device/Increase time)    Current prosthetic wear tolerance (days/week)  daily    Current prosthetic wear tolerance (#hours/day)  donnes upon rising in the am and wears until bedtime, drying as needed.  She does endorse removing leg some days half way through day due to pain.      Current prosthetic weight-bearing tolerance (hours/day)  PT educated when feels increased pressure on distal limb as probably has pumped fluid from limb with weight bearing.     Edema none    Residual limb condition  residual limb intact, wound on L closing with small proximal area not approximated but no drainage noted. Reports MD does not want anything over it.                         [facility] Adult PT Treatment/Exercise - 06/16/20 1726      Transfers   Transfers Sit to Stand;Stand to Sit    Sit to Stand 6: Modified independent (Device/Increase time)    Stand to Sit 6: Modified independent (Device/Increase time)      Ambulation/Gait   Ambulation/Gait Yes    Ambulation/Gait Assistance 6: Modified  independent (Device/Increase time)    Ambulation/Gait Assistance Details Pt ambulated in to session with rollator mod I with decreased stance time right.     Ambulation Distance (Feet) 75 Feet    Assistive device Rollator    Gait Pattern Step-through pattern;Decreased weight shift to right;Decreased stance time - right    Ambulation Surface Level;Indoor      Standardized Balance Assessment   Standardized Balance Assessment Berg Balance Test      Berg Balance Test   Sit to  Stand Able to stand  independently using hands    Standing Unsupported Able to stand safely 2 minutes    Sitting with Back Unsupported but Feet Supported on Floor or Stool Able to sit safely and securely 2 minutes    Stand to Sit Controls descent by using hands    Transfers Able to transfer safely, definite need of hands    Standing Unsupported with Eyes Closed Able to stand 10 seconds with supervision    Standing Ubsupported with Feet Together Needs help to attain position but able to stand for 30 seconds with feet together    From Standing, Reach Forward with Outstretched Arm Can reach forward >12 cm safely (5")    From Standing Position, Pick up Object from Floor Able to pick up shoe, needs supervision    From Standing Position, Turn to Look Behind Over each Shoulder Turn sideways only but maintains balance    Turn 360 Degrees Needs assistance while turning    Standing Unsupported, Alternately Place Feet on Step/Stool Able to complete >2 steps/needs minimal assist    Standing Unsupported, One Foot in Front Needs help to step but can hold 15 seconds    Standing on One Leg Tries to lift leg/unable to hold 3 seconds but remains standing independently    Total Score 32      Therapeutic Activites    Therapeutic Activities Other Therapeutic Activities    Other Therapeutic Activities PT discussed possible solutions to getting to toilet when wakes up at 3am in morning. She is unable to quickly donn leg as has to quickly get  to bathroom. She was crawling but was advised not to so is now scooting on bottom. Unable to turn knee scooter her daughter got her in small bathroom to turn. PT suggested using bsc next to bed for this. Pt going to see if there is room to use the bsc just at night next to bed.                     PT Short Term Goals - 06/16/20 1740      PT SHORT TERM GOAL #1   Title Patient verbalizes adjusting ply socks with limb volume changes with prosthesis. (All STGs Target Date: 05/23/2020)    Time 1    Period Months    Status Achieved    Target Date 05/23/20      PT SHORT TERM GOAL #2   Title Patient tolerates wear of prosthesis >12 hrs total / day without wound increasing.    Baseline MET 05/28/2020    Time 1    Period Months    Status Achieved    Target Date 05/23/20      PT SHORT TERM GOAL #3   Title Berg Balance >36/56    Baseline NOT RETESTED due to surgery 5/26, 06/16/20 32/56    Time 1    Period Months    Status Not Met    Target Date 05/23/20      PT SHORT TERM GOAL #4   Title Patient ambulates 200' with cane & prosthesis with supervision.    Baseline Deferred due to surgery on 05/21/2020    Time 1    Period Months    Status Deferred    Target Date 05/23/20      PT SHORT TERM GOAL #5   Title Patient negotiates stairs with single rail, ramps & curbs with cane & prosthesis with minA.    Baseline deferred due to  surgery on 05/21/2020    Time 1    Period Months    Status Deferred    Target Date 05/23/20             PT Long Term Goals - 06/16/20 1741      PT LONG TERM GOAL #1   Title Patient verbalizes & demonstrates understanding of prosthetic care & diabetic foot care to enable safe utilization. (All LTGs Target Date: 06/24/2020)    Time 3    Period Months    Status On-going      PT LONG TERM GOAL #2   Title Patient tolerates prosthesis wear >90% of awake hours without skin or residual limb pain to enable function throughout her day.    Time 3    Period  Months    Status On-going      PT LONG TERM GOAL #3   Title Berg Balance >/= 45/56 to indicate lower fall risk.    Baseline 06/16/20 32/56    Time 3    Period Months    Status On-going      PT LONG TERM GOAL #4   Title Patient ambulates 500' outdoors including grass with cane or less & prosthesis modified independent to enable community mobility.    Time 3    Period Months    Status On-going      PT LONG TERM GOAL #5   Title Patient negotiates ramps, curbs & stairs with single rail with cane or less & prosthesis modified independent to enable community access.    Time 3    Period Months    Status On-going      PT LONG TERM GOAL #6   Title Patient demonstrates skills to enable her to return to gardening including kneeling with UE support like garden kneel bench or chair.    Time 3    Period Months    Status On-going                 Plan - 06/16/20 1742    Clinical Impression Statement Plan to recert at 0/27 visit. PT session focused on trying to problem solve how to safely use bathroom in middle of night as well as education on fluid change in leg possibly contributing to pain she is having and needing to continue to adjust socks. PT started checking goals with Berg score of 32/56 which is significant improvement from initial eval. Pt still high fall risk.    Personal Factors and Comorbidities Age;Comorbidity 3+;Fitness;Time since onset of injury/illness/exacerbation;Transportation    Comorbidities Right TTA, HTN, DM, CVA, PAD, HLD, CKD st2,    Examination-Activity Limitations Locomotion Level;Stairs;Stand;Transfers;Toileting    Examination-Participation Restrictions Community Activity;Yard Work;Shop    Stability/Clinical Decision Making Evolving/Moderate complexity    Rehab Potential Good    PT Frequency 2x / week    PT Duration 12 weeks    PT Treatment/Interventions ADLs/Self Care Home Management;DME Instruction;Gait training;Stair training;Functional mobility  training;Therapeutic activities;Therapeutic exercise;Balance training;Neuromuscular re-education;Patient/family education;Prosthetic Training;Vestibular    PT Next Visit Plan 10th visit progress note. Continue checking LTGs with plan to recertify on 7/41 PT appt, work on prosthetic gait with cane including carrying items around furniture, continue standing LE theraband exercises.    Consulted and Agree with Plan of Care Patient           Patient will benefit from skilled therapeutic intervention in order to improve the following deficits and impairments:  Abnormal gait, Decreased activity tolerance, Decreased balance, Decreased endurance, Decreased knowledge of  use of DME, Decreased mobility, Decreased skin integrity, Decreased strength, Postural dysfunction, Prosthetic Dependency, Pain  Visit Diagnosis: Other abnormalities of gait and mobility  Muscle weakness (generalized)  Unsteadiness on feet     Problem List Patient Active Problem List   Diagnosis Date Noted  . PAD (peripheral artery disease) ([place]) 05/21/2020  . Acute metabolic encephalopathy 52/84/1324  . COVID-19 virus infection 02/08/2020  . Acute kidney injury superimposed on CKD ([place]) 02/08/2020  . Status post below-knee amputation of right lower extremity ([place]) 11/07/2019  . Gangrene of right foot ([place])   . Hyponatremia 10/29/2019  . Chronic diastolic CHF (congestive heart failure) ([place]) 10/29/2019  . Stable proliferative diabetic retinopathy of both eyes associated with type 2 diabetes mellitus ([place]) 06/05/2018  . Acute CVA (cerebrovascular accident) ([place]) 02/18/2017  . Acute ischemic stroke ([place])   . Internuclear ophthalmoplegia of left eye   . Benign paroxysmal positional vertigo   . Abnormality of gait   . Acute onset of severe vertigo 02/17/2017  . Vertigo 02/17/2017  . Atherosclerosis of native artery of extremity with intermittent claudication ([place]) 03/11/2014  . Diabetic retinopathy ([place]) 03/11/2014  .  Retinal hemorrhage due to secondary diabetes ([place]) 03/11/2014  . Type II diabetes mellitus with renal manifestations, uncontrolled ([place]) 03/11/2014  . Chronic hepatitis C without hepatic coma ([place]) 03/11/2014  . Hyperlipidemia 03/11/2014  . Hypoglycemia 04/16/2013  . Disorder of bone and cartilage, unspecified   . Other and unspecified hyperlipidemia   . Essential hypertension, benign   . Atherosclerosis of native artery of extremity ([place])   . Chronic kidney disease (CKD), stage II (mild)   . Peripheral arterial disease ([place])   . Anemia   . Postherpetic neuralgia   . Hypertension     Electa Sniff, PT, DPT, NCS 06/16/2020, 5:47 PM  [facility] [address] [place], [place], [address] Phone: [phone]   Fax:  [phone]  Name: Monique Cabrera MRN: [id number] Date of Birth: [birth year]

## 2020-06-18 ENCOUNTER — Ambulatory Visit: Payer: Medicare Other | Admitting: Physical Therapy

## 2020-06-18 ENCOUNTER — Other Ambulatory Visit: Payer: Self-pay

## 2020-06-18 ENCOUNTER — Encounter: Payer: Self-pay | Admitting: Physical Therapy

## 2020-06-18 DIAGNOSIS — Z9181 History of falling: Secondary | ICD-10-CM | POA: Diagnosis not present

## 2020-06-18 DIAGNOSIS — R2689 Other abnormalities of gait and mobility: Secondary | ICD-10-CM | POA: Diagnosis not present

## 2020-06-18 DIAGNOSIS — R293 Abnormal posture: Secondary | ICD-10-CM

## 2020-06-18 DIAGNOSIS — R2681 Unsteadiness on feet: Secondary | ICD-10-CM | POA: Diagnosis not present

## 2020-06-18 DIAGNOSIS — R531 Weakness: Secondary | ICD-10-CM | POA: Diagnosis not present

## 2020-06-18 DIAGNOSIS — M79605 Pain in left leg: Secondary | ICD-10-CM | POA: Diagnosis not present

## 2020-06-18 DIAGNOSIS — M6281 Muscle weakness (generalized): Secondary | ICD-10-CM | POA: Diagnosis not present

## 2020-06-18 NOTE — Therapy (Addendum)
Monique Cabrera [address] Monique Cabrera, [place], [address] Phone: [phone]   Fax:  [phone]  Physical Therapy Treatment, 10th Visit Progress Note & Recertification  Patient Details  Name: Monique Cabrera MRN: [id number] Date of Birth: 1935/10/05 Referring Provider (PT): Curt Jews, MD   Encounter Date: 06/18/2020     PT End of Session - 06/18/20 1329    Visit Number 20    Number of Visits 25    Date for PT Re-Evaluation 06/24/20    Authorization Type UHC Medicare & Medicaid    Progress Note Due on Visit 20    PT Start Time 1316    PT Stop Time 1400    PT Time Calculation (min) 44 min    Equipment Utilized During Treatment Gait belt    Activity Tolerance Patient tolerated treatment well;No increased pain    Behavior During Therapy WFL for tasks assessed/performed           Past Medical History:  Diagnosis Date  . Acute upper respiratory infections of unspecified site   . Amputee 08/2019  . Anemia   . Anemia, unspecified   . Arthritis   . Atherosclerosis of native arteries of the extremities, unspecified   . Chest pain, unspecified   . Chronic kidney disease (CKD), stage II (mild)    [facility]  . Diabetes mellitus without complication ([place])    Type II  . Diarrhea   . Disorder of bone and cartilage, unspecified   . Herpes zoster with other nervous system complications(053.19)   . History of blood transfusion   . Hypercalcemia   . Hypertension   . Hypertensive renal disease, benign   . Nonspecific reaction to tuberculin skin test without active tuberculosis(795.51)   . Other and unspecified hyperlipidemia   . PAD (peripheral artery disease) ([place])    Per records from [facility]  . Pain in joint, lower leg   . Peripheral arterial disease ([place])   . Postherpetic neuralgia   . Proteinuria   . Stroke ([place]) 01/2017  . Type II or unspecified type diabetes mellitus with renal manifestations,  uncontrolled(250.42)   . Unspecified disorder of kidney and ureter     Past Surgical History:  Procedure Laterality Date  . ABDOMINAL AORTOGRAM W/LOWER EXTREMITY N/A 10/31/2019   Procedure: ABDOMINAL AORTOGRAM W/LOWER EXTREMITY;  Surgeon: Monique Hampshire, MD;  Location: [facility];  Service: Cardiovascular;  Laterality: N/A;  . ABDOMINAL AORTOGRAM W/LOWER EXTREMITY Left 05/12/2020   Procedure: ABDOMINAL AORTOGRAM W/LOWER EXTREMITY;  Surgeon: Monique Sandy, MD;  Location: [facility];  Service: Cardiovascular;  Laterality: Left;  . AMPUTATION Right 11/02/2019   Procedure: AMPUTATION BELOW KNEE RIGHT;  Surgeon: Monique Posner, MD;  Location: [facility];  Service: Vascular;  Laterality: Right;  . COLONOSCOPY     x 2  . ENDARTERECTOMY FEMORAL Left 05/21/2020   Procedure: LEFT COMMON FEMORAL ENDARTERECTOMY;  Surgeon: Monique Posner, MD;  Location: [facility];  Service: Vascular;  Laterality: Left;  . FEMORAL-POPLITEAL BYPASS GRAFT Left 05/21/2020   Procedure: LEFT FEMORAL TO BELOW KNEE POPLITEAL ARTERY BYPASS GRAFT;  Surgeon: Monique Posner, MD;  Location: [facility];  Service: Vascular;  Laterality: Left;  . hysterectomy    . INCISION AND DRAINAGE Left 05/27/14   sebacous cyst, ear  . PRP Left    Dr. Ricki Cabrera  . removal of cyst from hand    . removal of tumor from foot    .  TONSILLECTOMY      There were no vitals filed for this visit.   Subjective Assessment - 06/19/20 0006    Subjective No new compliants. Does still report getting increased pain on residual limb at times. No falls.    Limitations Lifting;Standing;Walking;House hold activities    Patient Stated Goals To use prosthesis to walk without device, take care of myself, live alone. Go out in community.    Currently in Pain? No/denies    Pain Score 0-No pain                [facility] Adult PT Treatment/Exercise - 06/18/20 1332      Transfers   Transfers Sit to Stand;Stand to Sit    Sit to Stand 6: Modified  independent (Device/Increase time);With upper extremity assist;From bed;From chair/3-in-1    Stand to Sit 6: Modified independent (Device/Increase time);With upper extremity assist;To bed;To chair/3-in-1      Ambulation/Gait   Ambulation/Gait Yes    Ambulation/Gait Assistance 5: Supervision;4: Min guard;4: Min assist    Ambulation/Gait Assistance Details with rollator- supervision with cues for correct hand placement on brakes of rollator, step length, posture and to increase base of support with gait;  with cane min guard to min assist for balance with cues on posture, increased base of support, cane placement so not to step on and on weight shifting.     Ambulation Distance (Feet) 100 Feet   x1 with cane indoors; 500 x1 in/outdoor with rollator   Assistive device Rollator;Straight cane    Gait Pattern Step-through pattern;Decreased weight shift to right;Decreased stance time - right    Ambulation Surface Level;Indoor    Ramp 4: Min assist    Ramp Details (indicate cue type and reason) cues on brake use with outdoor inclines/declines      Prosthetics   Prosthetic Care Comments  pt with questions about why her limb hurts at random times almost daily. does report walking around home with no AD, just walls/furniture. disussed how activity level, sock ply management and increased weight bearing on limb affect pain. Discussed she should still be using rollator in community and cane/RW in home to "off load" prosthesis with a gradual built toward no AD as too much weight too soon causes pain to increase. Pt verbalized understanding. Also discussed that if using rollator or cane with gait with increased pain/discomfort that she should adjust her sock ply to find a more comfortable fit. Pt verbalized understanding of this as well.    Current prosthetic wear tolerance (days/week)  daily    Current prosthetic wear tolerance (#hours/day)  dones upon rising in the am and wears until bedtime, drying as needed.   She does endorse removing leg some days half way through day due to pain.      Residual limb condition  residual limb intact, wound on L closing with small proximal area not approximated but no drainage noted. Reports MD does not want anything over it.     Education Provided Residual limb care;Correct ply sock adjustment;Proper wear schedule/adjustment;Proper weight-bearing schedule/adjustment   see care comments as wel   Person(s) Educated Patient    Education Method Explanation;Demonstration;Verbal cues    Education Method Verbalized understanding;Returned demonstration;Verbal cues required;Needs further instruction    Donning Prosthesis Supervision    Doffing Prosthesis Modified independent (device/increased time)               PT Short Term Goals - 06/16/20 1740      PT SHORT TERM GOAL #1  Title Patient verbalizes adjusting ply socks with limb volume changes with prosthesis. (All STGs Target Date: 05/23/2020)    Time 1    Period Months    Status Achieved    Target Date 05/23/20      PT SHORT TERM GOAL #2   Title Patient tolerates wear of prosthesis >12 hrs total / day without wound increasing.    Baseline MET 05/28/2020    Time 1    Period Months    Status Achieved    Target Date 05/23/20      PT SHORT TERM GOAL #3   Title Berg Balance >36/56    Baseline NOT RETESTED due to surgery 5/26, 06/16/20 32/56    Time 1    Period Months    Status Not Met    Target Date 05/23/20      PT SHORT TERM GOAL #4   Title Patient ambulates 200' with cane & prosthesis with supervision.    Baseline Deferred due to surgery on 05/21/2020    Time 1    Period Months    Status Deferred    Target Date 05/23/20      PT SHORT TERM GOAL #5   Title Patient negotiates stairs with single rail, ramps & curbs with cane & prosthesis with minA.    Baseline deferred due to surgery on 05/21/2020    Time 1    Period Months    Status Deferred    Target Date 05/23/20             PT Long Term  Goals - 06/18/20 1336      PT LONG TERM GOAL #1   Title Patient verbalizes & demonstrates understanding of prosthetic care & diabetic foot care to enable safe utilization. (All LTGs Target Date: 06/24/2020)    Baseline 06/18/20: met with familiar concepts. Does still need cues on sock ply management. Will need guidance/education as unfamiliar concepts arise.    Status Achieved      PT LONG TERM GOAL #2   Title Patient tolerates prosthesis wear >90% of awake hours without skin or residual limb pain to enable function throughout her day.    Baseline 06/18/20: met to date    Status Achieved      PT LONG TERM GOAL #3   Title Berg Balance >/= 45/56 to indicate lower fall risk.    Baseline 06/16/20 32/56, improved from eval just not to goal level    Status Partially Met      PT LONG TERM GOAL #4   Title Patient ambulates 500' outdoors including grass with cane or less & prosthesis modified independent to enable community mobility.    Baseline 06/18/20: met at supervision level with rollator, has not worked with cane on outdoor surfaces at this time    Time --    Period --    Status Not Met      PT LONG TERM GOAL #5   Title Patient negotiates ramps, curbs & stairs with single rail with cane or less & prosthesis modified independent to enable community access.    Baseline 06/18/20: have not initiated cane use on barriers to date    Time --    Period --    Status Not Met      PT LONG TERM GOAL #6   Title Patient demonstrates skills to enable her to return to gardening including kneeling with UE support like garden kneel bench or chair.    Baseline 06/18/20: have not addressed this  goal to date    Time --    Period --    Status Unable to assess                 Plan - 06/18/20 1332    Clinical Impression Statement Today's skilled session focused on progress toward LTGs with some goals met, some partially met and other's not met due to just starting to use a cane with gait. Time was  spent on education on potential reason's pt is having increased limb pain. Pt demo'd a better understanding of how to decrease pain with prosthesis during session. The pt is progressing toward goals. Primary PT to recert to continue to address prosthetic education, gait/barriers with prosthesis and balance reactions. The pt should benefit from continued PT to progress toward new goals.    Personal Factors and Comorbidities Age;Comorbidity 3+;Fitness;Time since onset of injury/illness/exacerbation;Transportation    Comorbidities Right TTA, HTN, DM, CVA, PAD, HLD, CKD st2,    Examination-Activity Limitations Locomotion Level;Stairs;Stand;Transfers;Toileting    Examination-Participation Restrictions Community Activity;Yard Work;Shop    Stability/Clinical Decision Making Evolving/Moderate complexity    Rehab Potential Good    PT Frequency 2x / week    PT Duration 12 weeks    PT Treatment/Interventions ADLs/Self Care Home Management;DME Instruction;Gait training;Stair training;Functional mobility training;Therapeutic activities;Therapeutic exercise;Balance training;Neuromuscular re-education;Patient/family education;Prosthetic Training;Vestibular    PT Next Visit Plan work on prosthetic gait with cane including carrying items around furniture, barriers with cane, continue standing LE theraband exercises.    Consulted and Agree with Plan of Care Patient           Patient will benefit from skilled therapeutic intervention in order to improve the following deficits and impairments:  Abnormal gait, Decreased activity tolerance, Decreased balance, Decreased endurance, Decreased knowledge of use of DME, Decreased mobility, Decreased skin integrity, Decreased strength, Postural dysfunction, Prosthetic Dependency, Pain  Visit Diagnosis: Other abnormalities of gait and mobility  Muscle weakness (generalized)  Unsteadiness on feet  Abnormal posture  Weakness generalized     Problem List Patient  Active Problem List   Diagnosis Date Noted  . PAD (peripheral artery disease) ([place]) 05/21/2020  . Acute metabolic encephalopathy 16/09/9603  . COVID-19 virus infection 02/08/2020  . Acute kidney injury superimposed on CKD ([place]) 02/08/2020  . Status post below-knee amputation of right lower extremity ([place]) 11/07/2019  . Gangrene of right foot ([place])   . Hyponatremia 10/29/2019  . Chronic diastolic CHF (congestive heart failure) ([place]) 10/29/2019  . Stable proliferative diabetic retinopathy of both eyes associated with type 2 diabetes mellitus ([place]) 06/05/2018  . Acute CVA (cerebrovascular accident) ([place]) 02/18/2017  . Acute ischemic stroke ([place])   . Internuclear ophthalmoplegia of left eye   . Benign paroxysmal positional vertigo   . Abnormality of gait   . Acute onset of severe vertigo 02/17/2017  . Vertigo 02/17/2017  . Atherosclerosis of native artery of extremity with intermittent claudication ([place]) 03/11/2014  . Diabetic retinopathy ([place]) 03/11/2014  . Retinal hemorrhage due to secondary diabetes ([place]) 03/11/2014  . Type II diabetes mellitus with renal manifestations, uncontrolled ([place]) 03/11/2014  . Chronic hepatitis C without hepatic coma ([place]) 03/11/2014  . Hyperlipidemia 03/11/2014  . Hypoglycemia 04/16/2013  . Disorder of bone and cartilage, unspecified   . Other and unspecified hyperlipidemia   . Essential hypertension, benign   . Atherosclerosis of native artery of extremity ([place])   . Chronic kidney disease (CKD), stage II (mild)   . Peripheral arterial disease ([place])   . Anemia   .  Postherpetic neuralgia   . Hypertension     Willow Ora, [place], [place] [name], [place], [place] [address] [phone] 06/19/20, 12:07 AM   Name: Monique Cabrera MRN: [id number] Date of Birth: 1935-10-24  Progress Note Reporting Period 05/05/2020 to 06/18/2020  See note above & below for Objective Data and Assessment of Progress/Goals.    PT  End of Session - 06/19/20 0807    Visit Number 20    Number of Visits 44    Date for PT Re-Evaluation 09/12/20    Authorization Type UHC Medicare & Medicaid    Progress Note Due on Visit 30    Equipment Utilized During Treatment Gait belt    Activity Tolerance Patient tolerated treatment well;No increased pain    Behavior During Therapy [facility] for tasks assessed/performed           PT Short Term Goals - 06/19/20 0810      PT SHORT TERM GOAL #1   Title Patient verbalizes how to manage limb pain / discomfort including adjusting ply socks with limb volume changes with prosthesis. (All STGs Target Date: 07/18/2020)    Time 1    Period Months    Status Revised    Target Date 07/18/20      PT SHORT TERM GOAL #2   Title Patient tolerates wear of prosthesis >12 hrs total / day & verbalizes adjusting assistive device with limb pain </= 2/10 including outside of PT.    Time 1    Period Months    Status Revised    Target Date 07/18/20      PT SHORT TERM GOAL #3   Title Berg Balance >36/56    Time 1    Period Months    Status On-going    Target Date 07/18/20      PT SHORT TERM GOAL #4   Title Patient ambulates 200' with cane & prosthesis with supervision.    Baseline --    Time 1    Period Months    Status On-going    Target Date 07/18/20      PT SHORT TERM GOAL #5   Title Patient negotiates stairs with single rail, ramps & curbs with cane & prosthesis with minA.    Baseline --    Time 1    Period Months    Status On-going    Target Date 07/18/20           06/18/20 1336 PT LONG TERM GOAL #1 Title Patient verbalizes & demonstrates understanding of prosthetic care & diabetic foot care to enable safe utilization. (All LTGs Target Date: 06/24/2020) Baseline 06/18/20: met with familiar concepts. Does still need cues on sock ply management. Will need guidance/education as unfamiliar concepts arise. Status Achieved PT LONG TERM GOAL #2 Title Patient tolerates prosthesis wear >90%  of awake hours without skin or residual limb pain to enable function throughout her day. Baseline 06/18/20: met to date Status Achieved PT LONG TERM GOAL #3 Title Berg Balance >/= 45/56 to indicate lower fall risk. Baseline 06/16/20 32/56, improved from eval just not to goal level Status Partially Met PT LONG TERM GOAL #4 Title Patient ambulates 500' outdoors including grass with cane or less & prosthesis modified independent to enable community mobility. Baseline 06/18/20: met at supervision level with rollator, has not worked with cane on outdoor surfaces at this time Status Not Met PT LONG TERM GOAL #5 Title Patient negotiates ramps, curbs & stairs with single  rail with cane or less & prosthesis modified independent to enable community access. Baseline 06/18/20: have not initiated cane use on barriers to date Status Not Met PT LONG TERM GOAL #6 Title Patient demonstrates skills to enable her to return to gardening including kneeling with UE support like garden kneel bench or chair. Status Unable to assess Baseline 06/18/20: have not addressed this goal to date   PT Long Term Goals - 06/19/20 0815      PT LONG TERM GOAL #1   Title Patient verbalizes & demonstrates understanding of prosthetic care including modifying assistive device to manage limb pain & diabetic foot care to enable safe utilization. (All LTGs Target Date: 09/12/2020)    Time 12    Period Weeks    Status Revised    Target Date 09/12/20      PT LONG TERM GOAL #2   Title Patient tolerates prosthesis wear >90% of awake hours & verbalizes appropriate assistive device for environment without skin or residual limb pain to enable function throughout her day.    Time 12    Period Weeks    Status Revised    Target Date 09/12/20      PT LONG TERM GOAL #3   Title Berg Balance >/= 45/56 to indicate lower fall risk.    Time 12    Period Weeks    Status On-going    Target Date 09/12/20      PT LONG TERM GOAL #4   Title  Patient ambulates 500' outdoors including grass with cane or less & prosthesis modified independent to enable community mobility.    Time 12    Period Weeks    Status On-going    Target Date 09/12/20      PT LONG TERM GOAL #5   Title Patient negotiates ramps, curbs & stairs with single rail with cane or less & prosthesis modified independent to enable community access.    Time 12    Period Weeks    Status On-going    Target Date 09/12/20      PT LONG TERM GOAL #6   Title Patient demonstrates skills to enable her to return to gardening including kneeling with UE support like garden kneel bench or chair.    Time 12    Period Weeks    Status On-going    Target Date 09/12/20      PT LONG TERM GOAL #7   Title Patient ambulates 100' with prosthesis only carrying household items in [place] negotiating furniture modified independent.    Time 12    Period Weeks    Status New    Target Date 09/12/20           Plan - 06/19/20 0820    Clinical Impression Statement Patient made progress towards LTGs but did not meet them. She developed a wound on LLE (non-amputated) and had vascular surgery. She was limited for >1 month of initial 65-KCL certification period which limited progress. Patient has potential to function with her prosthesis household without assistive device and basic community with cane with further PT.    Personal Factors and Comorbidities Age;Comorbidity 3+;Fitness;Time since onset of injury/illness/exacerbation;Transportation    Comorbidities Right TTA, HTN, DM, CVA, PAD, HLD, CKD st2,    Examination-Activity Limitations Locomotion Level;Stairs;Stand;Transfers;Toileting    Examination-Participation Restrictions Community Activity;Yard Work;Shop    Stability/Clinical Decision Making Evolving/Moderate complexity    Rehab Potential Good    PT Frequency 2x / week    PT Duration 12 weeks  PT Treatment/Interventions ADLs/Self Care Home Management;DME Instruction;Gait training;Stair  training;Functional mobility training;Therapeutic activities;Therapeutic exercise;Balance training;Neuromuscular re-education;Patient/family education;Prosthetic Training;Vestibular;Manual techniques    PT Next Visit Plan work towards updated STGs, update HEP, review prosthetic care including adjusting ply socks & modifying assistive device    Consulted and Agree with Plan of Care Patient          Jamey Reas, PT, DPT PT Specializing in [place] 06/19/20 5:38 PM Phone:  [phone]  Fax:  [phone] [facility] [address] [place], [place] [address]

## 2020-06-19 NOTE — Addendum Note (Signed)
Addended by: Isaias Cowman on: 06/19/2020 05:41 PM   Modules accepted: Orders

## 2020-06-23 ENCOUNTER — Ambulatory Visit: Payer: Medicare Other | Admitting: Rehabilitation

## 2020-06-23 ENCOUNTER — Other Ambulatory Visit: Payer: Self-pay

## 2020-06-23 NOTE — Therapy (Signed)
[facility] [address] [place], [place], [address] Phone: [phone]   Fax:  [phone]  Patient Details  Name: Monique Cabrera MRN: [id number] Date of Birth: [birth year] Referring Provider:  [name], DO  Encounter Date: 06/23/2020  Pt arrived to session not feeling well.  Reports that she started to begin feeling nauseated in the [place] on the way here.  BP was 153/78.  Pt reports elevated blood sugar this morning but has not checked since.  Provided pt with ginger ale and crackers (educated to sip on ginger ale since it was not a diet drink).  Transportation called for pt.    Cameron Sprang, PT, MPT [facility] [address] [place], [place], [address] Phone: [phone]   Fax:  [phone] 06/23/20, 1:46 PM

## 2020-06-25 ENCOUNTER — Encounter: Payer: Medicare Other | Admitting: Physical Therapy

## 2020-06-26 ENCOUNTER — Ambulatory Visit: Payer: Medicare Other | Attending: Vascular Surgery | Admitting: Physical Therapy

## 2020-06-26 ENCOUNTER — Encounter: Payer: Self-pay | Admitting: Physical Therapy

## 2020-06-26 ENCOUNTER — Other Ambulatory Visit: Payer: Self-pay

## 2020-06-26 DIAGNOSIS — R531 Weakness: Secondary | ICD-10-CM | POA: Insufficient documentation

## 2020-06-26 DIAGNOSIS — M6281 Muscle weakness (generalized): Secondary | ICD-10-CM | POA: Insufficient documentation

## 2020-06-26 DIAGNOSIS — R293 Abnormal posture: Secondary | ICD-10-CM

## 2020-06-26 DIAGNOSIS — R2681 Unsteadiness on feet: Secondary | ICD-10-CM | POA: Diagnosis not present

## 2020-06-26 DIAGNOSIS — R2689 Other abnormalities of gait and mobility: Secondary | ICD-10-CM | POA: Diagnosis not present

## 2020-06-26 NOTE — Patient Instructions (Signed)
Access Code: [email or web address] URL: [email or web address] Date: 06/26/2020 Prepared by: Willow Ora  Exercises Side Stepping with Counter Support - 1 x daily - 5 x weekly - 1 sets - 3 reps Walking March - 1 x daily - 5 x weekly - 1 sets - 4 reps Backward Walking with Counter Support - 1 x daily - 5 x weekly - 1 sets - 4 reps Standing Near Stance in Corner - 1 x daily - 5 x weekly - 1 sets - 3 reps - 30 hold Standing Balance in Corner with Eyes Closed - 1 x daily - 5 x weekly - 1 sets - 10 reps

## 2020-06-26 NOTE — Therapy (Signed)
[facility] [address] [place], [place], [address] Phone: [phone]   Fax:  [phone]  Physical Therapy Treatment  Patient Details  Name: Monique Cabrera MRN: [id number] Date of Birth: 1935-04-19 Referring Provider (PT): Curt Jews, MD   Encounter Date: 06/26/2020   PT End of Session - 06/26/20 1243    Visit Number 21    Number of Visits 44    Date for PT Re-Evaluation 09/12/20    Authorization Type UHC Medicare & Medicaid    Progress Note Due on Visit 30    PT Start Time 1235    PT Stop Time 1315    PT Time Calculation (min) 40 min    Equipment Utilized During Treatment Gait belt    Activity Tolerance Patient tolerated treatment well;No increased pain    Behavior During Therapy WFL for tasks assessed/performed           Past Medical History:  Diagnosis Date  . Acute upper respiratory infections of unspecified site   . Amputee 08/2019  . Anemia   . Anemia, unspecified   . Arthritis   . Atherosclerosis of native arteries of the extremities, unspecified   . Chest pain, unspecified   . Chronic kidney disease (CKD), stage II (mild)    [facility]  . Diabetes mellitus without complication ([place])    Type II  . Diarrhea   . Disorder of bone and cartilage, unspecified   . Herpes zoster with other nervous system complications(053.19)   . History of blood transfusion   . Hypercalcemia   . Hypertension   . Hypertensive renal disease, benign   . Nonspecific reaction to tuberculin skin test without active tuberculosis(795.51)   . Other and unspecified hyperlipidemia   . PAD (peripheral artery disease) ([place])    Per records from [name]  . Pain in joint, lower leg   . Peripheral arterial disease ([place])   . Postherpetic neuralgia   . Proteinuria   . Stroke ([place]) 01/2017  . Type II or unspecified type diabetes mellitus with renal manifestations, uncontrolled(250.42)   . Unspecified disorder of kidney  and ureter     Past Surgical History:  Procedure Laterality Date  . ABDOMINAL AORTOGRAM W/LOWER EXTREMITY N/A 10/31/2019   Procedure: ABDOMINAL AORTOGRAM W/LOWER EXTREMITY;  Surgeon: Wellington Hampshire, MD;  Location: [facility];  Service: Cardiovascular;  Laterality: N/A;  . ABDOMINAL AORTOGRAM W/LOWER EXTREMITY Left 05/12/2020   Procedure: ABDOMINAL AORTOGRAM W/LOWER EXTREMITY;  Surgeon: Waynetta Sandy, MD;  Location: [facility];  Service: Cardiovascular;  Laterality: Left;  . AMPUTATION Right 11/02/2019   Procedure: AMPUTATION BELOW KNEE RIGHT;  Surgeon: Rosetta Posner, MD;  Location: [facility];  Service: Vascular;  Laterality: Right;  . COLONOSCOPY     x 2  . ENDARTERECTOMY FEMORAL Left 05/21/2020   Procedure: LEFT COMMON FEMORAL ENDARTERECTOMY;  Surgeon: Rosetta Posner, MD;  Location: [facility];  Service: Vascular;  Laterality: Left;  . FEMORAL-POPLITEAL BYPASS GRAFT Left 05/21/2020   Procedure: LEFT FEMORAL TO BELOW KNEE POPLITEAL ARTERY BYPASS GRAFT;  Surgeon: Rosetta Posner, MD;  Location: [facility];  Service: Vascular;  Laterality: Left;  . hysterectomy    . INCISION AND DRAINAGE Left 05/27/14   sebacous cyst, ear  . PRP Left    Dr. Ricki Miller  . removal of cyst from hand    . removal of tumor from foot    . TONSILLECTOMY      There were  no vitals filed for this visit.   Subjective Assessment - 06/26/20 1238    Subjective No falls. Had a great birthday yesterday. Continues to play with sock ply management for a comfortable fit. Reports changing them a single ply at a time to get prothesis comfortable.    Limitations Lifting;Standing;Walking;House hold activities    Patient Stated Goals To use prosthesis to walk without device, take care of myself, live alone. Go out in community.    Currently in Pain? No/denies    Pain Score 0-No pain               [facility] Adult PT Treatment/Exercise - 06/26/20 1247      Transfers   Transfers Sit to Stand;Stand to Sit    Sit  to Stand 6: Modified independent (Device/Increase time);With upper extremity assist;From bed;From chair/3-in-1    Stand to Sit 6: Modified independent (Device/Increase time);With upper extremity assist;To bed;To chair/3-in-1      Ambulation/Gait   Ambulation/Gait Yes    Ambulation/Gait Assistance 4: Min guard;6: Modified independent (Device/Increase time)    Ambulation/Gait Assistance Details Mod I with rollator into/out of gym. use of straight cane in session- cues on posture, cane placement, weight shifting and for equal step length. one episode of balance loss when pt steped on rubber tip of cane with left foot needed min assist to recover.    Assistive device Rollator;Straight cane    Gait Pattern Step-through pattern;Decreased weight shift to right;Decreased stance time - right    Ambulation Surface Level;Indoor      Neuro Re-ed    Neuro Re-ed Details  revised pt's HEP to emphasize balance. Refer to [facility] program for full details.  min guard assist with cues on form/technique.           issued to pt's HEP today:  Access Code: [email or web address] URL: [email or web address] Date: 06/26/2020 Prepared by: Willow Ora  Exercises Side Stepping with Counter Support - 1 x daily - 5 x weekly - 1 sets - 3 reps Walking March - 1 x daily - 5 x weekly - 1 sets - 4 reps Backward Walking with Counter Support - 1 x daily - 5 x weekly - 1 sets - 4 reps Standing Near Stance in Corner - 1 x daily - 5 x weekly - 1 sets - 3 reps - 30 hold Standing Balance in Corner with Eyes Closed - 1 x daily - 5 x weekly - 1 sets - 10 reps       PT Education - 06/26/20 1556    Education Details updated HEP for balance    Person(s) Educated Patient    Methods Explanation;Demonstration;Verbal cues;Handout    Comprehension Verbalized understanding;Returned demonstration;Verbal cues required;Need further instruction            PT Short Term Goals - 06/19/20 0810      PT SHORT TERM GOAL #1   Title  Patient verbalizes how to manage limb pain / discomfort including adjusting ply socks with limb volume changes with prosthesis. (All STGs Target Date: 07/18/2020)    Time 1    Period Months    Status Revised    Target Date 07/18/20      PT SHORT TERM GOAL #2   Title Patient tolerates wear of prosthesis >12 hrs total / day & verbalizes adjusting assistive device with limb pain </= 2/10 including outside of PT.    Time 1    Period Months    Status Revised    Target Date  07/18/20      PT SHORT TERM GOAL #3   Title Merrilee Jansky Balance >36/56    Time 1    Period Months    Status On-going    Target Date 07/18/20      PT SHORT TERM GOAL #4   Title Patient ambulates 200' with cane & prosthesis with supervision.    Baseline --    Time 1    Period Months    Status On-going    Target Date 07/18/20      PT SHORT TERM GOAL #5   Title Patient negotiates stairs with single rail, ramps & curbs with cane & prosthesis with minA.    Baseline --    Time 1    Period Months    Status On-going    Target Date 07/18/20             PT Long Term Goals - 06/19/20 0815      PT LONG TERM GOAL #1   Title Patient verbalizes & demonstrates understanding of prosthetic care including modifying assistive device to manage limb pain & diabetic foot care to enable safe utilization. (All LTGs Target Date: 09/12/2020)    Time 12    Period Weeks    Status Revised    Target Date 09/12/20      PT LONG TERM GOAL #2   Title Patient tolerates prosthesis wear >90% of awake hours & verbalizes appropriate assistive device for environment without skin or residual limb pain to enable function throughout her day.    Time 12    Period Weeks    Status Revised    Target Date 09/12/20      PT LONG TERM GOAL #3   Title [place] Balance >/= 45/56 to indicate lower fall risk.    Time 12    Period Weeks    Status On-going    Target Date 09/12/20      PT LONG TERM GOAL #4   Title Patient ambulates 500' outdoors including  grass with cane or less & prosthesis modified independent to enable community mobility.    Time 12    Period Weeks    Status On-going    Target Date 09/12/20      PT LONG TERM GOAL #5   Title Patient negotiates ramps, curbs & stairs with single rail with cane or less & prosthesis modified independent to enable community access.    Time 12    Period Weeks    Status On-going    Target Date 09/12/20      PT LONG TERM GOAL #6   Title Patient demonstrates skills to enable her to return to gardening including kneeling with UE support like garden kneel bench or chair.    Time 12    Period Weeks    Status On-going    Target Date 09/12/20      PT LONG TERM GOAL #7   Title Patient ambulates 100' with prosthesis only carrying household items in [place] negotiating furniture modified independent.    Time 12    Period Weeks    Status New    Target Date 09/12/20                 Plan - 06/26/20 1244    Clinical Impression Statement Today's skilled session focused on advancement of pt's HEP with more focus on balance ex's. No issues reported with performance in session today. The pt is progressing toward goals and should benefit from continued PT to  progress toward unmet goals.    Personal Factors and Comorbidities Age;Comorbidity 3+;Fitness;Time since onset of injury/illness/exacerbation;Transportation    Comorbidities Right TTA, HTN, DM, CVA, PAD, HLD, CKD st2,    Examination-Activity Limitations Locomotion Level;Stairs;Stand;Transfers;Toileting    Examination-Participation Restrictions Community Activity;Yard Work;Shop    Stability/Clinical Decision Making Evolving/Moderate complexity    Rehab Potential Good    PT Frequency 2x / week    PT Duration 12 weeks    PT Treatment/Interventions ADLs/Self Care Home Management;DME Instruction;Gait training;Stair training;Functional mobility training;Therapeutic activities;Therapeutic exercise;Balance training;Neuromuscular  re-education;Patient/family education;Prosthetic Training;Vestibular;Manual techniques    PT Next Visit Plan work towards updated STGs,  review prosthetic care including adjusting ply socks & modifying assistive device    Consulted and Agree with Plan of Care Patient           Patient will benefit from skilled therapeutic intervention in order to improve the following deficits and impairments:  Abnormal gait, Decreased activity tolerance, Decreased balance, Decreased endurance, Decreased knowledge of use of DME, Decreased mobility, Decreased skin integrity, Decreased strength, Postural dysfunction, Prosthetic Dependency, Pain  Visit Diagnosis: Other abnormalities of gait and mobility  Muscle weakness (generalized)  Unsteadiness on feet  Abnormal posture     Problem List Patient Active Problem List   Diagnosis Date Noted  . PAD (peripheral artery disease) ([place]) 05/21/2020  . Acute metabolic encephalopathy 29/56/2130  . COVID-19 virus infection 02/08/2020  . Acute kidney injury superimposed on CKD ([place]) 02/08/2020  . Status post below-knee amputation of right lower extremity ([place]) 11/07/2019  . Gangrene of right foot ([place])   . Hyponatremia 10/29/2019  . Chronic diastolic CHF (congestive heart failure) ([place]) 10/29/2019  . Stable proliferative diabetic retinopathy of both eyes associated with type 2 diabetes mellitus ([place]) 06/05/2018  . Acute CVA (cerebrovascular accident) ([place]) 02/18/2017  . Acute ischemic stroke ([place])   . Internuclear ophthalmoplegia of left eye   . Benign paroxysmal positional vertigo   . Abnormality of gait   . Acute onset of severe vertigo 02/17/2017  . Vertigo 02/17/2017  . Atherosclerosis of native artery of extremity with intermittent claudication ([place]) 03/11/2014  . Diabetic retinopathy ([place]) 03/11/2014  . Retinal hemorrhage due to secondary diabetes ([place]) 03/11/2014  . Type II diabetes mellitus with renal manifestations, uncontrolled ([place]) 03/11/2014   . Chronic hepatitis C without hepatic coma ([place]) 03/11/2014  . Hyperlipidemia 03/11/2014  . Hypoglycemia 04/16/2013  . Disorder of bone and cartilage, unspecified   . Other and unspecified hyperlipidemia   . Essential hypertension, benign   . Atherosclerosis of native artery of extremity ([place])   . Chronic kidney disease (CKD), stage II (mild)   . Peripheral arterial disease ([place])   . Anemia   . Postherpetic neuralgia   . Hypertension     Willow Ora, [place], [place] [address] [place], [place] [address] [phone] 06/26/20, 4:27 PM   Name: Monique Cabrera MRN: [id number] Date of Birth: [birth year]

## 2020-07-01 ENCOUNTER — Ambulatory Visit: Payer: Medicare Other | Admitting: Physical Therapy

## 2020-07-01 ENCOUNTER — Other Ambulatory Visit: Payer: Self-pay

## 2020-07-01 ENCOUNTER — Encounter: Payer: Self-pay | Admitting: Physical Therapy

## 2020-07-01 DIAGNOSIS — M6281 Muscle weakness (generalized): Secondary | ICD-10-CM

## 2020-07-01 DIAGNOSIS — R293 Abnormal posture: Secondary | ICD-10-CM | POA: Diagnosis not present

## 2020-07-01 DIAGNOSIS — R2681 Unsteadiness on feet: Secondary | ICD-10-CM | POA: Diagnosis not present

## 2020-07-01 DIAGNOSIS — R531 Weakness: Secondary | ICD-10-CM | POA: Diagnosis not present

## 2020-07-01 DIAGNOSIS — R2689 Other abnormalities of gait and mobility: Secondary | ICD-10-CM | POA: Diagnosis not present

## 2020-07-02 NOTE — Therapy (Signed)
[facility] [address] [place], [place], [address] Phone: [phone]   Fax:  [phone]  Physical Therapy Treatment  Patient Details  Name: Monique Cabrera MRN: [id number] Date of Birth: 1935/04/04 Referring Provider (PT): Curt Jews, MD   Encounter Date: 07/01/2020   PT End of Session - 07/01/20 1407    Visit Number 22    Number of Visits 44    Date for PT Re-Evaluation 09/12/20    Authorization Type [employer] Medicare & Medicaid    Progress Note Due on Visit 30    PT Start Time 1402    PT Stop Time 1443    PT Time Calculation (min) 41 min    Equipment Utilized During Treatment Gait belt    Activity Tolerance Patient tolerated treatment well;No increased pain    Behavior During Therapy WFL for tasks assessed/performed           Past Medical History:  Diagnosis Date  . Acute upper respiratory infections of unspecified site   . Amputee 08/2019  . Anemia   . Anemia, unspecified   . Arthritis   . Atherosclerosis of native arteries of the extremities, unspecified   . Chest pain, unspecified   . Chronic kidney disease (CKD), stage II (mild)    [facility]  . Diabetes mellitus without complication ([place])    Type II  . Diarrhea   . Disorder of bone and cartilage, unspecified   . Herpes zoster with other nervous system complications(053.19)   . History of blood transfusion   . Hypercalcemia   . Hypertension   . Hypertensive renal disease, benign   . Nonspecific reaction to tuberculin skin test without active tuberculosis(795.51)   . Other and unspecified hyperlipidemia   . PAD (peripheral artery disease) ([place])    Per records from [facility]  . Pain in joint, lower leg   . Peripheral arterial disease ([place])   . Postherpetic neuralgia   . Proteinuria   . Stroke ([place]) 01/2017  . Type II or unspecified type diabetes mellitus with renal manifestations, uncontrolled(250.42)   . Unspecified disorder of kidney  and ureter     Past Surgical History:  Procedure Laterality Date  . ABDOMINAL AORTOGRAM W/LOWER EXTREMITY N/A 10/31/2019   Procedure: ABDOMINAL AORTOGRAM W/LOWER EXTREMITY;  Surgeon: Wellington Hampshire, MD;  Location: [facility];  Service: Cardiovascular;  Laterality: N/A;  . ABDOMINAL AORTOGRAM W/LOWER EXTREMITY Left 05/12/2020   Procedure: ABDOMINAL AORTOGRAM W/LOWER EXTREMITY;  Surgeon: Waynetta Sandy, MD;  Location: [facility];  Service: Cardiovascular;  Laterality: Left;  . AMPUTATION Right 11/02/2019   Procedure: AMPUTATION BELOW KNEE RIGHT;  Surgeon: Rosetta Posner, MD;  Location: [facility];  Service: Vascular;  Laterality: Right;  . COLONOSCOPY     x 2  . ENDARTERECTOMY FEMORAL Left 05/21/2020   Procedure: LEFT COMMON FEMORAL ENDARTERECTOMY;  Surgeon: Rosetta Posner, MD;  Location: [facility];  Service: Vascular;  Laterality: Left;  . FEMORAL-POPLITEAL BYPASS GRAFT Left 05/21/2020   Procedure: LEFT FEMORAL TO BELOW KNEE POPLITEAL ARTERY BYPASS GRAFT;  Surgeon: Rosetta Posner, MD;  Location: [facility];  Service: Vascular;  Laterality: Left;  . hysterectomy    . INCISION AND DRAINAGE Left 05/27/14   sebacous cyst, ear  . PRP Left    Dr. Ricki Miller  . removal of cyst from hand    . removal of tumor from foot    . TONSILLECTOMY      There were  no vitals filed for this visit.   Subjective Assessment - 07/01/20 1406    Subjective No new complaints. No falls or pain to report. Has been doing the new balance ex's without any issues. Using the cane some at home, still feels very off balance with it.    Limitations Lifting;Standing;Walking;House hold activities    Patient Stated Goals To use prosthesis to walk without device, take care of myself, live alone. Go out in community.    Currently in Pain? No/denies    Pain Score 0-No pain              [facility] Adult PT Treatment/Exercise - 07/01/20 1408      Transfers   Transfers Sit to Stand;Stand to Sit    Sit to Stand 6:  Modified independent (Device/Increase time);With upper extremity assist;From bed;From chair/3-in-1    Stand to Sit 6: Modified independent (Device/Increase time);With upper extremity assist;To bed;To chair/3-in-1      Ambulation/Gait   Ambulation/Gait Yes    Ambulation/Gait Assistance 4: Min guard;6: Modified independent (Device/Increase time)    Ambulation/Gait Assistance Details Mod I with rollator; min guard assist to min assist with cane in session with cues for posture, increased base of support and cane placement.     Ambulation Distance (Feet) 120 Feet   x1, 215 x1, plus around gym with session   Assistive device Rollator;Straight cane   cane with rubber quad tip   Gait Pattern Step-through pattern;Decreased weight shift to right;Decreased stance time - right    Ambulation Surface Level;Indoor      High Level Balance   High Level Balance Activities Negotitating around obstacles;Negotiating over obstacles    High Level Balance Comments with cane/prosthesis- forward stepping over 3 bolsters of varied height<> figure 8 around hoola hoops for 4 laps each, min guard to min assist for balance, cues on sequencing and technique; with cane/prosthesis- side stepping left<>right for 3 laps each way, then backward walking for 4 laps total with cane, min guard to min assist with cues on step length, weight shifting and posture needed.     Prosthetics   Current prosthetic wear tolerance (days/week)  daily    Current prosthetic wear tolerance (#hours/day)  dones upon rising in the am and wears until bedtime, drying as needed.  She does report removing it at times due to pain, however less than she has been as the pain does not occur as often with sock ply management    Residual limb condition  residual limb intact; wound on L incision that is closing with small proximal area not approximated, however now scabed over and appears to be healing.      Donning Prosthesis Supervision    Doffing Prosthesis  Modified independent (device/increased time)                    PT Short Term Goals - 06/19/20 0810      PT SHORT TERM GOAL #1   Title Patient verbalizes how to manage limb pain / discomfort including adjusting ply socks with limb volume changes with prosthesis. (All STGs Target Date: 07/18/2020)    Time 1    Period Months    Status Revised    Target Date 07/18/20      PT SHORT TERM GOAL #2   Title Patient tolerates wear of prosthesis >12 hrs total / day & verbalizes adjusting assistive device with limb pain </= 2/10 including outside of PT.    Time 1  Period Months    Status Revised    Target Date 07/18/20      PT SHORT TERM GOAL #3   Title Berg Balance >36/56    Time 1    Period Months    Status On-going    Target Date 07/18/20      PT SHORT TERM GOAL #4   Title Patient ambulates 200' with cane & prosthesis with supervision.    Baseline --    Time 1    Period Months    Status On-going    Target Date 07/18/20      PT SHORT TERM GOAL #5   Title Patient negotiates stairs with single rail, ramps & curbs with cane & prosthesis with minA.    Baseline --    Time 1    Period Months    Status On-going    Target Date 07/18/20             PT Long Term Goals - 06/19/20 0815      PT LONG TERM GOAL #1   Title Patient verbalizes & demonstrates understanding of prosthetic care including modifying assistive device to manage limb pain & diabetic foot care to enable safe utilization. (All LTGs Target Date: 09/12/2020)    Time 12    Period Weeks    Status Revised    Target Date 09/12/20      PT LONG TERM GOAL #2   Title Patient tolerates prosthesis wear >90% of awake hours & verbalizes appropriate assistive device for environment without skin or residual limb pain to enable function throughout her day.    Time 12    Period Weeks    Status Revised    Target Date 09/12/20      PT LONG TERM GOAL #3   Title Berg Balance >/= 45/56 to indicate lower fall risk.     Time 12    Period Weeks    Status On-going    Target Date 09/12/20      PT LONG TERM GOAL #4   Title Patient ambulates 500' outdoors including grass with cane or less & prosthesis modified independent to enable community mobility.    Time 12    Period Weeks    Status On-going    Target Date 09/12/20      PT LONG TERM GOAL #5   Title Patient negotiates ramps, curbs & stairs with single rail with cane or less & prosthesis modified independent to enable community access.    Time 12    Period Weeks    Status On-going    Target Date 09/12/20      PT LONG TERM GOAL #6   Title Patient demonstrates skills to enable her to return to gardening including kneeling with UE support like garden kneel bench or chair.    Time 12    Period Weeks    Status On-going    Target Date 09/12/20      PT LONG TERM GOAL #7   Title Patient ambulates 100' with prosthesis only carrying household items in [place] negotiating furniture modified independent.    Time 12    Period Weeks    Status New    Target Date 09/12/20                 Plan - 07/01/20 1407    Clinical Impression Statement Today's skilled session continued to focus on gait/balance with prosthesis/cane with rest breaks taken as needed. The pt is progressing toward goals and should  benefit from continued PT to progress toward unmet goals.    Personal Factors and Comorbidities Age;Comorbidity 3+;Fitness;Time since onset of injury/illness/exacerbation;Transportation    Comorbidities Right TTA, HTN, DM, CVA, PAD, HLD, CKD st2,    Examination-Activity Limitations Locomotion Level;Stairs;Stand;Transfers;Toileting    Examination-Participation Restrictions Community Activity;Yard Work;Shop    Stability/Clinical Decision Making Evolving/Moderate complexity    Rehab Potential Good    PT Frequency 2x / week    PT Duration 12 weeks    PT Treatment/Interventions ADLs/Self Care Home Management;DME Instruction;Gait training;Stair training;Functional  mobility training;Therapeutic activities;Therapeutic exercise;Balance training;Neuromuscular re-education;Patient/family education;Prosthetic Training;Vestibular;Manual techniques    PT Next Visit Plan work towards updated STGs,  review prosthetic care including adjusting ply socks & modifying assistive device    Consulted and Agree with Plan of Care Patient           Patient will benefit from skilled therapeutic intervention in order to improve the following deficits and impairments:  Abnormal gait, Decreased activity tolerance, Decreased balance, Decreased endurance, Decreased knowledge of use of DME, Decreased mobility, Decreased skin integrity, Decreased strength, Postural dysfunction, Prosthetic Dependency, Pain  Visit Diagnosis: Other abnormalities of gait and mobility  Muscle weakness (generalized)  Unsteadiness on feet  Abnormal posture     Problem List Patient Active Problem List   Diagnosis Date Noted  . PAD (peripheral artery disease) ([place]) 05/21/2020  . Acute metabolic encephalopathy 86/57/8469  . COVID-19 virus infection 02/08/2020  . Acute kidney injury superimposed on CKD ([place]) 02/08/2020  . Status post below-knee amputation of right lower extremity ([place]) 11/07/2019  . Gangrene of right foot ([place])   . Hyponatremia 10/29/2019  . Chronic diastolic CHF (congestive heart failure) ([place]) 10/29/2019  . Stable proliferative diabetic retinopathy of both eyes associated with type 2 diabetes mellitus ([place]) 06/05/2018  . Acute CVA (cerebrovascular accident) ([place]) 02/18/2017  . Acute ischemic stroke ([place])   . Internuclear ophthalmoplegia of left eye   . Benign paroxysmal positional vertigo   . Abnormality of gait   . Acute onset of severe vertigo 02/17/2017  . Vertigo 02/17/2017  . Atherosclerosis of native artery of extremity with intermittent claudication ([place]) 03/11/2014  . Diabetic retinopathy ([place]) 03/11/2014  . Retinal hemorrhage due to secondary diabetes ([place])  03/11/2014  . Type II diabetes mellitus with renal manifestations, uncontrolled ([place]) 03/11/2014  . Chronic hepatitis C without hepatic coma ([place]) 03/11/2014  . Hyperlipidemia 03/11/2014  . Hypoglycemia 04/16/2013  . Disorder of bone and cartilage, unspecified   . Other and unspecified hyperlipidemia   . Essential hypertension, benign   . Atherosclerosis of native artery of extremity ([place])   . Chronic kidney disease (CKD), stage II (mild)   . Peripheral arterial disease ([place])   . Anemia   . Postherpetic neuralgia   . Hypertension     [place], [place], [place] [address], [place], [place] [address] [phone] 07/02/20, 9:21 PM   Name: Monique Cabrera MRN: [id number] Date of Birth: [birth year]

## 2020-07-03 ENCOUNTER — Encounter: Payer: Self-pay | Admitting: Physical Therapy

## 2020-07-03 ENCOUNTER — Other Ambulatory Visit: Payer: Self-pay

## 2020-07-03 ENCOUNTER — Ambulatory Visit: Payer: Medicare Other | Admitting: Physical Therapy

## 2020-07-03 DIAGNOSIS — I96 Gangrene, not elsewhere classified: Secondary | ICD-10-CM | POA: Diagnosis not present

## 2020-07-03 DIAGNOSIS — R2681 Unsteadiness on feet: Secondary | ICD-10-CM | POA: Diagnosis not present

## 2020-07-03 DIAGNOSIS — R2689 Other abnormalities of gait and mobility: Secondary | ICD-10-CM

## 2020-07-03 DIAGNOSIS — I639 Cerebral infarction, unspecified: Secondary | ICD-10-CM | POA: Diagnosis not present

## 2020-07-03 DIAGNOSIS — R293 Abnormal posture: Secondary | ICD-10-CM | POA: Diagnosis not present

## 2020-07-03 DIAGNOSIS — R531 Weakness: Secondary | ICD-10-CM | POA: Diagnosis not present

## 2020-07-03 DIAGNOSIS — M6281 Muscle weakness (generalized): Secondary | ICD-10-CM | POA: Diagnosis not present

## 2020-07-03 NOTE — Therapy (Signed)
[facility] [address] [place], [place], [address] Phone: [phone]   Fax:  [phone]  Physical Therapy Treatment  Patient Details  Name: RAMI WADDLE MRN: [id number] Date of Birth: 1935-10-03 Referring Provider (PT): Curt Jews, MD   Encounter Date: 07/03/2020   PT End of Session - 07/03/20 1450    Visit Number 23    Number of Visits 44    Date for PT Re-Evaluation 09/12/20    Authorization Type [employer] Medicare & Medicaid    Progress Note Due on Visit 30    PT Start Time 1400    PT Stop Time 1445    PT Time Calculation (min) 45 min    Equipment Utilized During Treatment Gait belt    Activity Tolerance Patient tolerated treatment well;No increased pain    Behavior During Therapy WFL for tasks assessed/performed           Past Medical History:  Diagnosis Date  . Acute upper respiratory infections of unspecified site   . Amputee 08/2019  . Anemia   . Anemia, unspecified   . Arthritis   . Atherosclerosis of native arteries of the extremities, unspecified   . Chest pain, unspecified   . Chronic kidney disease (CKD), stage II (mild)    [facility]  . Diabetes mellitus without complication ([place])    Type II  . Diarrhea   . Disorder of bone and cartilage, unspecified   . Herpes zoster with other nervous system complications(053.19)   . History of blood transfusion   . Hypercalcemia   . Hypertension   . Hypertensive renal disease, benign   . Nonspecific reaction to tuberculin skin test without active tuberculosis(795.51)   . Other and unspecified hyperlipidemia   . PAD (peripheral artery disease) ([place])    Per records from [facility]  . Pain in joint, lower leg   . Peripheral arterial disease ([place])   . Postherpetic neuralgia   . Proteinuria   . Stroke ([place]) 01/2017  . Type II or unspecified type diabetes mellitus with renal manifestations, uncontrolled(250.42)   . Unspecified disorder of kidney  and ureter     Past Surgical History:  Procedure Laterality Date  . ABDOMINAL AORTOGRAM W/LOWER EXTREMITY N/A 10/31/2019   Procedure: ABDOMINAL AORTOGRAM W/LOWER EXTREMITY;  Surgeon: Wellington Hampshire, MD;  Location: [facility];  Service: Cardiovascular;  Laterality: N/A;  . ABDOMINAL AORTOGRAM W/LOWER EXTREMITY Left 05/12/2020   Procedure: ABDOMINAL AORTOGRAM W/LOWER EXTREMITY;  Surgeon: Waynetta Sandy, MD;  Location: [facility];  Service: Cardiovascular;  Laterality: Left;  . AMPUTATION Right 11/02/2019   Procedure: AMPUTATION BELOW KNEE RIGHT;  Surgeon: Rosetta Posner, MD;  Location: [facility];  Service: Vascular;  Laterality: Right;  . COLONOSCOPY     x 2  . ENDARTERECTOMY FEMORAL Left 05/21/2020   Procedure: LEFT COMMON FEMORAL ENDARTERECTOMY;  Surgeon: Rosetta Posner, MD;  Location: [facility];  Service: Vascular;  Laterality: Left;  . FEMORAL-POPLITEAL BYPASS GRAFT Left 05/21/2020   Procedure: LEFT FEMORAL TO BELOW KNEE POPLITEAL ARTERY BYPASS GRAFT;  Surgeon: Rosetta Posner, MD;  Location: [facility];  Service: Vascular;  Laterality: Left;  . hysterectomy    . INCISION AND DRAINAGE Left 05/27/14   sebacous cyst, ear  . PRP Left    Dr. Ricki Miller  . removal of cyst from hand    . removal of tumor from foot    . TONSILLECTOMY      There were  no vitals filed for this visit.   Subjective Assessment - 07/03/20 1404    Subjective Pt. reports no falls, no changes since last visit. No reports of pain currently, but states that she occasionally has pain on the front, distal residual limb; she states she adds a sock but then it feels too tight around the knee.    Limitations Lifting;Standing;Walking;House hold activities    Patient Stated Goals To use prosthesis to walk without device, take care of myself, live alone. Go out in community.    Currently in Pain? No/denies    Pain Score 0-No pain               Prosthetics Assessment - 07/03/20 1409      Prosthetics    Prosthetic Care Comments  Pt. reported feeling pressure and pain on the distal residual limb occasionally; she was educated on using a cut-off sock when this occurs. Pt. was told never to wear this on the outside of other socks because it would roll down; pt. verbalized understanding with using cut-off sock.             [facility] Adult PT Treatment/Exercise - 07/03/20 1409      Ambulation/Gait   Ambulation/Gait Yes    Ambulation/Gait Assistance 4: Min guard    Ambulation Distance (Feet) 230 Feet   x 1 with SPC; 20 ft x 1 with no AD   Assistive device Straight cane;Rolling walker;Prosthesis    Gait Pattern Step-through pattern;Decreased arm swing - left;Decreased stance time - right    Ambulation Surface Level;Indoor    Gait Comments VC's required for pt. to maintain wider BOS as well as keeping cane farther out in front of L foot. Pt. then ambulated 20 ft. with no AD and min. assist provided by PTA; one episode of poor foot clearance with toe drag on prosthetic side.      High Level Balance   High Level Balance Activities Side stepping;Backward walking;Negotiating over obstacles    High Level Balance Comments Pt. performed sidestepping with SPC for 3 laps at the countertop; also performed bwd walking for 3 laps using SPC and intermittent UE support on countertop; pt. then performed 2 laps stepping over 3 obstacles (black foam beams); she had several episodes of LOB with this, min. assist provided by SPTA.         Self-Care   Self-Care ADL's;Other Self-Care Comments    Other Self-Care Comments  Pt. reported in todays session that she has been ambulating around her home with no AD; pt. was then educated on using a bedside commode to avoid any falls or injury from her preferred method of "scooting" on the floor to the restroom when she does not have her prosthesis on, or ambulating with no AD if she is wearing prosthesis. Pt. states that neither her RW or knee scooter fit into the doorway of the  bathroom, so again a bedside commode was reiterated.      Prosthetics   Current prosthetic wear tolerance (days/week)  daily    Current prosthetic wear tolerance (#hours/day)  dons upon rising in the am and wears until bedtime. Still feeling some pain on residual limb but was educated on using half sock.    Residual limb condition  No issues with skin integrity    Donning Prosthesis Supervision    Doffing Prosthesis Modified independent (device/increased time)              PT Short Term Goals - 06/19/20 4098  PT SHORT TERM GOAL #1   Title Patient verbalizes how to manage limb pain / discomfort including adjusting ply socks with limb volume changes with prosthesis. (All STGs Target Date: 07/18/2020)    Time 1    Period Months    Status Revised    Target Date 07/18/20      PT SHORT TERM GOAL #2   Title Patient tolerates wear of prosthesis >12 hrs total / day & verbalizes adjusting assistive device with limb pain </= 2/10 including outside of PT.    Time 1    Period Months    Status Revised    Target Date 07/18/20      PT SHORT TERM GOAL #3   Title Berg Balance >36/56    Time 1    Period Months    Status On-going    Target Date 07/18/20      PT SHORT TERM GOAL #4   Title Patient ambulates 200' with cane & prosthesis with supervision.    Baseline --    Time 1    Period Months    Status On-going    Target Date 07/18/20      PT SHORT TERM GOAL #5   Title Patient negotiates stairs with single rail, ramps & curbs with cane & prosthesis with minA.    Baseline --    Time 1    Period Months    Status On-going    Target Date 07/18/20             PT Long Term Goals - 06/19/20 0815      PT LONG TERM GOAL #1   Title Patient verbalizes & demonstrates understanding of prosthetic care including modifying assistive device to manage limb pain & diabetic foot care to enable safe utilization. (All LTGs Target Date: 09/12/2020)    Time 12    Period Weeks    Status Revised     Target Date 09/12/20      PT LONG TERM GOAL #2   Title Patient tolerates prosthesis wear >90% of awake hours & verbalizes appropriate assistive device for environment without skin or residual limb pain to enable function throughout her day.    Time 12    Period Weeks    Status Revised    Target Date 09/12/20      PT LONG TERM GOAL #3   Title Berg Balance >/= 45/56 to indicate lower fall risk.    Time 12    Period Weeks    Status On-going    Target Date 09/12/20      PT LONG TERM GOAL #4   Title Patient ambulates 500' outdoors including grass with cane or less & prosthesis modified independent to enable community mobility.    Time 12    Period Weeks    Status On-going    Target Date 09/12/20      PT LONG TERM GOAL #5   Title Patient negotiates ramps, curbs & stairs with single rail with cane or less & prosthesis modified independent to enable community access.    Time 12    Period Weeks    Status On-going    Target Date 09/12/20      PT LONG TERM GOAL #6   Title Patient demonstrates skills to enable her to return to gardening including kneeling with UE support like garden kneel bench or chair.    Time 12    Period Weeks    Status On-going    Target Date 09/12/20  PT LONG TERM GOAL #7   Title Patient ambulates 100' with prosthesis only carrying household items in [place] negotiating furniture modified independent.    Time 12    Period Weeks    Status New    Target Date 09/12/20                 Plan - 07/03/20 1456    Clinical Impression Statement Todays therapy session placed emphasis on prosthetic training with SPC; pt. was noticeably more unsteady this session compared to last. Pt. had episodes of LOB and poor foot clearance during gait and when stepping over obstacles. Pt. continues to need reminders for sequencing during gait with prosthesis. Pt. will benefit from further PT in order to continue to progress toward unmet goals.    Personal Factors and  Comorbidities Age;Comorbidity 3+;Fitness;Time since onset of injury/illness/exacerbation;Transportation    Comorbidities Right TTA, HTN, DM, CVA, PAD, HLD, CKD st2,    Examination-Activity Limitations Locomotion Level;Stairs;Stand;Transfers;Toileting    Examination-Participation Restrictions Community Activity;Yard Work;Shop    Stability/Clinical Decision Making Evolving/Moderate complexity    Rehab Potential Good    PT Frequency 2x / week    PT Duration 12 weeks    PT Treatment/Interventions ADLs/Self Care Home Management;DME Instruction;Gait training;Stair training;Functional mobility training;Therapeutic activities;Therapeutic exercise;Balance training;Neuromuscular re-education;Patient/family education;Prosthetic Training;Vestibular;Manual techniques    PT Next Visit Plan Continue with prosthetic training with SPC & reiterating adjusting socks    Consulted and Agree with Plan of Care Patient           Patient will benefit from skilled therapeutic intervention in order to improve the following deficits and impairments:  Abnormal gait, Decreased activity tolerance, Decreased balance, Decreased endurance, Decreased knowledge of use of DME, Decreased mobility, Decreased skin integrity, Decreased strength, Postural dysfunction, Prosthetic Dependency, Pain  Visit Diagnosis: Other abnormalities of gait and mobility  Muscle weakness (generalized)  Unsteadiness on feet     Problem List Patient Active Problem List   Diagnosis Date Noted  . PAD (peripheral artery disease) ([place]) 05/21/2020  . Acute metabolic encephalopathy 76/73/4193  . COVID-19 virus infection 02/08/2020  . Acute kidney injury superimposed on CKD ([place]) 02/08/2020  . Status post below-knee amputation of right lower extremity ([place]) 11/07/2019  . Gangrene of right foot ([place])   . Hyponatremia 10/29/2019  . Chronic diastolic CHF (congestive heart failure) ([place]) 10/29/2019  . Stable proliferative diabetic retinopathy of  both eyes associated with type 2 diabetes mellitus ([place]) 06/05/2018  . Acute CVA (cerebrovascular accident) ([place]) 02/18/2017  . Acute ischemic stroke ([place])   . Internuclear ophthalmoplegia of left eye   . Benign paroxysmal positional vertigo   . Abnormality of gait   . Acute onset of severe vertigo 02/17/2017  . Vertigo 02/17/2017  . Atherosclerosis of native artery of extremity with intermittent claudication ([place]) 03/11/2014  . Diabetic retinopathy ([place]) 03/11/2014  . Retinal hemorrhage due to secondary diabetes ([place]) 03/11/2014  . Type II diabetes mellitus with renal manifestations, uncontrolled ([place]) 03/11/2014  . Chronic hepatitis C without hepatic coma ([place]) 03/11/2014  . Hyperlipidemia 03/11/2014  . Hypoglycemia 04/16/2013  . Disorder of bone and cartilage, unspecified   . Other and unspecified hyperlipidemia   . Essential hypertension, benign   . Atherosclerosis of native artery of extremity ([place])   . Chronic kidney disease (CKD), stage II (mild)   . Peripheral arterial disease ([place])   . Anemia   . Postherpetic neuralgia   . Hypertension     Royann Shivers , SPTA 07/03/2020, 3:00 PM  [facility] [address] [place], [place], [address] Phone: [phone]   Fax:  [phone]  Name: CHESTINA KOMATSU MRN: [id number] Date of Birth: 22-Jan-1935   This note has been reviewed and edited by supervising CI.  Willow Ora, PTA, [facility] [address] [place], [place] [address] [phone] 07/03/20, 3:26 PM

## 2020-07-04 ENCOUNTER — Other Ambulatory Visit: Payer: Self-pay | Admitting: *Deleted

## 2020-07-04 MED ORDER — BD PEN NEEDLE SHORT U/F 31G X 8 MM MISC: 1 refills | Status: AC

## 2020-07-04 NOTE — Telephone Encounter (Signed)
Refill Request received from [facility] [address]

## 2020-07-07 ENCOUNTER — Ambulatory Visit: Payer: Medicare Other

## 2020-07-07 ENCOUNTER — Other Ambulatory Visit: Payer: Self-pay

## 2020-07-07 DIAGNOSIS — R2689 Other abnormalities of gait and mobility: Secondary | ICD-10-CM | POA: Diagnosis not present

## 2020-07-07 DIAGNOSIS — R531 Weakness: Secondary | ICD-10-CM | POA: Diagnosis not present

## 2020-07-07 DIAGNOSIS — M6281 Muscle weakness (generalized): Secondary | ICD-10-CM

## 2020-07-07 DIAGNOSIS — R2681 Unsteadiness on feet: Secondary | ICD-10-CM | POA: Diagnosis not present

## 2020-07-07 DIAGNOSIS — R293 Abnormal posture: Secondary | ICD-10-CM | POA: Diagnosis not present

## 2020-07-07 NOTE — Therapy (Signed)
[facility] [address] [place], [place], [address] Phone: [phone]   Fax:  [phone]  Physical Therapy Treatment  Patient Details  Name: Monique Cabrera MRN: [id number] Date of Birth: 1935/09/08 Referring Provider (PT): Curt Jews, MD   Encounter Date: 07/07/2020   PT End of Session - 07/07/20 1233    Visit Number 24    Number of Visits 44    Date for PT Re-Evaluation 09/12/20    Authorization Type [employer]HC Medicare & Medicaid    Progress Note Due on Visit 30    PT Start Time 1230    PT Stop Time 1312    PT Time Calculation (min) 42 min    Equipment Utilized During Treatment Gait belt    Activity Tolerance Patient tolerated treatment well;No increased pain    Behavior During Therapy WFL for tasks assessed/performed           Past Medical History:  Diagnosis Date  . Acute upper respiratory infections of unspecified site   . Amputee 08/2019  . Anemia   . Anemia, unspecified   . Arthritis   . Atherosclerosis of native arteries of the extremities, unspecified   . Chest pain, unspecified   . Chronic kidney disease (CKD), stage II (mild)    [facility]  . Diabetes mellitus without complication ([place])    Type II  . Diarrhea   . Disorder of bone and cartilage, unspecified   . Herpes zoster with other nervous system complications(053.19)   . History of blood transfusion   . Hypercalcemia   . Hypertension   . Hypertensive renal disease, benign   . Nonspecific reaction to tuberculin skin test without active tuberculosis(795.51)   . Other and unspecified hyperlipidemia   . PAD (peripheral artery disease) ([place])    Per records from [name]  . Pain in joint, lower leg   . Peripheral arterial disease ([place])   . Postherpetic neuralgia   . Proteinuria   . Stroke ([place]) 01/2017  . Type II or unspecified type diabetes mellitus with renal manifestations, uncontrolled(250.42)   . Unspecified disorder of  kidney and ureter     Past Surgical History:  Procedure Laterality Date  . ABDOMINAL AORTOGRAM W/LOWER EXTREMITY N/A 10/31/2019   Procedure: ABDOMINAL AORTOGRAM W/LOWER EXTREMITY;  Surgeon: Wellington Hampshire, MD;  Location: [facility];  Service: Cardiovascular;  Laterality: N/A;  . ABDOMINAL AORTOGRAM W/LOWER EXTREMITY Left 05/12/2020   Procedure: ABDOMINAL AORTOGRAM W/LOWER EXTREMITY;  Surgeon: Waynetta Sandy, MD;  Location: [facility];  Service: Cardiovascular;  Laterality: Left;  . AMPUTATION Right 11/02/2019   Procedure: AMPUTATION BELOW KNEE RIGHT;  Surgeon: Rosetta Posner, MD;  Location: [facility];  Service: Vascular;  Laterality: Right;  . COLONOSCOPY     x 2  . ENDARTERECTOMY FEMORAL Left 05/21/2020   Procedure: LEFT COMMON FEMORAL ENDARTERECTOMY;  Surgeon: Rosetta Posner, MD;  Location: [facility];  Service: Vascular;  Laterality: Left;  . FEMORAL-POPLITEAL BYPASS GRAFT Left 05/21/2020   Procedure: LEFT FEMORAL TO BELOW KNEE POPLITEAL ARTERY BYPASS GRAFT;  Surgeon: Rosetta Posner, MD;  Location: [facility];  Service: Vascular;  Laterality: Left;  . hysterectomy    . INCISION AND DRAINAGE Left 05/27/14   sebacous cyst, ear  . PRP Left    Dr. Ricki Miller  . removal of cyst from hand    . removal of tumor from foot    . TONSILLECTOMY      There were  no vitals filed for this visit.   Subjective Assessment - 07/07/20 1234    Subjective Pt reports that her leg is feeling better with the cut off sock. She had to cut one for herself as could not find the other one that therapist cut before.    Limitations Lifting;Standing;Walking;House hold activities    Patient Stated Goals To use prosthesis to walk without device, take care of myself, live alone. Go out in community.    Currently in Pain? No/denies                             [facility] Adult PT Treatment/Exercise - 07/07/20 1236      Transfers   Transfers Sit to Stand;Stand to Sit    Sit to Stand 5:  Supervision    Stand to Sit 5: Supervision      Ambulation/Gait   Ambulation/Gait Yes    Ambulation/Gait Assistance 4: Min guard    Ambulation/Gait Assistance Details Pt was cued to squeeze her right glut during stance for more upright posture and to increase right weight shift.    Ambulation Distance (Feet) 345 Feet    Assistive device Straight cane;Prosthesis    Gait Pattern Step-through pattern;Decreased stance time - right    Ambulation Surface Level;Indoor    Stairs Yes    Stairs Assistance 4: Min guard    Stairs Assistance Details (indicate cue type and reason) Pt was given verbal cues for sequence with descent as came down with prosthesis at first. Utilized 1 rialing and cane    Stair Management Technique One rail Right;With cane;Step to pattern    Number of Stairs 4    Gait Comments In // bars: reciprocal steps over 4 beams 2" height with bilateral UE  support x 2 laps then LUE support x 2 laps then only SPC support x 4 laps. CGA/min assist with SPC support. At counter: side stepping without UE support 6' x 4. Standing tapping stone with LLE x 10 with CGA/min assist and verbal cues to tighten glut to shift weight over right leg. Gait along counter without UE support 6' x 6. Pt has decreased weight shift right      Prosthetics   Prosthetic Care Comments  Pt reports that she continues to adjust her socks as needed. Is using the cut off sock which has been helping with distal limb pain.    Current prosthetic wear tolerance (days/week)  daily    Current prosthetic wear tolerance (#hours/day)  Pt reports that she is wearing her leg pretty much all day as long as it is not bothering her. Takes off around 8 at night.                     PT Short Term Goals - 06/19/20 0810      PT SHORT TERM GOAL #1   Title Patient verbalizes how to manage limb pain / discomfort including adjusting ply socks with limb volume changes with prosthesis. (All STGs Target Date: 07/18/2020)    Time 1     Period Months    Status Revised    Target Date 07/18/20      PT SHORT TERM GOAL #2   Title Patient tolerates wear of prosthesis >12 hrs total / day & verbalizes adjusting assistive device with limb pain </= 2/10 including outside of PT.    Time 1    Period Months    Status Revised  Target Date 07/18/20      PT SHORT TERM GOAL #3   Title Merrilee Jansky Balance >36/56    Time 1    Period Months    Status On-going    Target Date 07/18/20      PT SHORT TERM GOAL #4   Title Patient ambulates 200' with cane & prosthesis with supervision.    Baseline --    Time 1    Period Months    Status On-going    Target Date 07/18/20      PT SHORT TERM GOAL #5   Title Patient negotiates stairs with single rail, ramps & curbs with cane & prosthesis with minA.    Baseline --    Time 1    Period Months    Status On-going    Target Date 07/18/20             PT Long Term Goals - 06/19/20 0815      PT LONG TERM GOAL #1   Title Patient verbalizes & demonstrates understanding of prosthetic care including modifying assistive device to manage limb pain & diabetic foot care to enable safe utilization. (All LTGs Target Date: 09/12/2020)    Time 12    Period Weeks    Status Revised    Target Date 09/12/20      PT LONG TERM GOAL #2   Title Patient tolerates prosthesis wear >90% of awake hours & verbalizes appropriate assistive device for environment without skin or residual limb pain to enable function throughout her day.    Time 12    Period Weeks    Status Revised    Target Date 09/12/20      PT LONG TERM GOAL #3   Title [place] Balance >/= 45/56 to indicate lower fall risk.    Time 12    Period Weeks    Status On-going    Target Date 09/12/20      PT LONG TERM GOAL #4   Title Patient ambulates 500' outdoors including grass with cane or less & prosthesis modified independent to enable community mobility.    Time 12    Period Weeks    Status On-going    Target Date 09/12/20      PT LONG  TERM GOAL #5   Title Patient negotiates ramps, curbs & stairs with single rail with cane or less & prosthesis modified independent to enable community access.    Time 12    Period Weeks    Status On-going    Target Date 09/12/20      PT LONG TERM GOAL #6   Title Patient demonstrates skills to enable her to return to gardening including kneeling with UE support like garden kneel bench or chair.    Time 12    Period Weeks    Status On-going    Target Date 09/12/20      PT LONG TERM GOAL #7   Title Patient ambulates 100' with prosthesis only carrying household items in [place] negotiating furniture modified independent.    Time 12    Period Weeks    Status New    Target Date 09/12/20                 Plan - 07/07/20 1722    Clinical Impression Statement PT focused on gait training with SPC during session today. Pt was more stable than prior session and reported that leg was feeling better today with no pain.    Personal Factors and  Comorbidities Age;Comorbidity 3+;Fitness;Time since onset of injury/illness/exacerbation;Transportation    Comorbidities Right TTA, HTN, DM, CVA, PAD, HLD, CKD st2,    Examination-Activity Limitations Locomotion Level;Stairs;Stand;Transfers;Toileting    Examination-Participation Restrictions Community Activity;Yard Work;Shop    Stability/Clinical Decision Making Evolving/Moderate complexity    Rehab Potential Good    PT Frequency 2x / week    PT Duration 12 weeks    PT Treatment/Interventions ADLs/Self Care Home Management;DME Instruction;Gait training;Stair training;Functional mobility training;Therapeutic activities;Therapeutic exercise;Balance training;Neuromuscular re-education;Patient/family education;Prosthetic Training;Vestibular;Manual techniques    PT Next Visit Plan Continue with prosthetic training with [facility] & reiterating adjusting socks. Work on activities to increase right weight shift.    Consulted and Agree with Plan of Care Patient            Patient will benefit from skilled therapeutic intervention in order to improve the following deficits and impairments:  Abnormal gait, Decreased activity tolerance, Decreased balance, Decreased endurance, Decreased knowledge of use of DME, Decreased mobility, Decreased skin integrity, Decreased strength, Postural dysfunction, Prosthetic Dependency, Pain  Visit Diagnosis: Other abnormalities of gait and mobility  Muscle weakness (generalized)     Problem List Patient Active Problem List   Diagnosis Date Noted  . PAD (peripheral artery disease) ([place]) 05/21/2020  . Acute metabolic encephalopathy 12/45/8099  . COVID-19 virus infection 02/08/2020  . Acute kidney injury superimposed on CKD ([place]) 02/08/2020  . Status post below-knee amputation of right lower extremity ([place]) 11/07/2019  . Gangrene of right foot ([place])   . Hyponatremia 10/29/2019  . Chronic diastolic CHF (congestive heart failure) ([place]) 10/29/2019  . Stable proliferative diabetic retinopathy of both eyes associated with type 2 diabetes mellitus ([place]) 06/05/2018  . Acute CVA (cerebrovascular accident) ([place]) 02/18/2017  . Acute ischemic stroke ([place])   . Internuclear ophthalmoplegia of left eye   . Benign paroxysmal positional vertigo   . Abnormality of gait   . Acute onset of severe vertigo 02/17/2017  . Vertigo 02/17/2017  . Atherosclerosis of native artery of extremity with intermittent claudication ([place]) 03/11/2014  . Diabetic retinopathy ([place]) 03/11/2014  . Retinal hemorrhage due to secondary diabetes ([place]) 03/11/2014  . Type II diabetes mellitus with renal manifestations, uncontrolled ([place]) 03/11/2014  . Chronic hepatitis C without hepatic coma ([place]) 03/11/2014  . Hyperlipidemia 03/11/2014  . Hypoglycemia 04/16/2013  . Disorder of bone and cartilage, unspecified   . Other and unspecified hyperlipidemia   . Essential hypertension, benign   . Atherosclerosis of native artery of extremity ([place])   . Chronic  kidney disease (CKD), stage II (mild)   . Peripheral arterial disease ([place])   . Anemia   . Postherpetic neuralgia   . Hypertension     Electa Sniff, PT, DPT, NCS 07/07/2020, 5:26 PM  [facility] [address] [place], [place], [address] Phone: [phone]   Fax:  [phone]  Name: Monique Cabrera MRN: [id number] Date of Birth: December 28, 1934

## 2020-07-10 ENCOUNTER — Ambulatory Visit: Payer: Medicare Other | Admitting: Physical Therapy

## 2020-07-10 ENCOUNTER — Other Ambulatory Visit: Payer: Self-pay

## 2020-07-10 ENCOUNTER — Encounter: Payer: Self-pay | Admitting: Physical Therapy

## 2020-07-10 DIAGNOSIS — R2689 Other abnormalities of gait and mobility: Secondary | ICD-10-CM | POA: Diagnosis not present

## 2020-07-10 DIAGNOSIS — R2681 Unsteadiness on feet: Secondary | ICD-10-CM

## 2020-07-10 DIAGNOSIS — M6281 Muscle weakness (generalized): Secondary | ICD-10-CM

## 2020-07-10 DIAGNOSIS — R293 Abnormal posture: Secondary | ICD-10-CM | POA: Diagnosis not present

## 2020-07-10 DIAGNOSIS — R531 Weakness: Secondary | ICD-10-CM | POA: Diagnosis not present

## 2020-07-10 NOTE — Therapy (Signed)
[facility] [address] [place], [place], [address] Phone: [phone]   Fax:  [phone]  Physical Therapy Treatment  Patient Details  Name: Monique Cabrera MRN: [id number] Date of Birth: 05-10-1935 Referring Provider (PT): Curt Jews, MD   Encounter Date: 07/10/2020   PT End of Session - 07/10/20 1404    Visit Number 25    Number of Visits 44    Date for PT Re-Evaluation 09/12/20    Authorization Type UHC Medicare & Medicaid    Progress Note Due on Visit 30    PT Start Time 1400    PT Stop Time 1447    PT Time Calculation (min) 47 min    Equipment Utilized During Treatment Gait belt    Activity Tolerance Patient tolerated treatment well;No increased pain    Behavior During Therapy WFL for tasks assessed/performed           Past Medical History:  Diagnosis Date  . Acute upper respiratory infections of unspecified site   . Amputee 08/2019  . Anemia   . Anemia, unspecified   . Arthritis   . Atherosclerosis of native arteries of the extremities, unspecified   . Chest pain, unspecified   . Chronic kidney disease (CKD), stage II (mild)    [facility]  . Diabetes mellitus without complication ([place])    Type II  . Diarrhea   . Disorder of bone and cartilage, unspecified   . Herpes zoster with other nervous system complications(053.19)   . History of blood transfusion   . Hypercalcemia   . Hypertension   . Hypertensive renal disease, benign   . Nonspecific reaction to tuberculin skin test without active tuberculosis(795.51)   . Other and unspecified hyperlipidemia   . PAD (peripheral artery disease) ([place])    Per records from [facility]  . Pain in joint, lower leg   . Peripheral arterial disease ([place])   . Postherpetic neuralgia   . Proteinuria   . Stroke ([place]) 01/2017  . Type II or unspecified type diabetes mellitus with renal manifestations, uncontrolled(250.42)   . Unspecified disorder of  kidney and ureter     Past Surgical History:  Procedure Laterality Date  . ABDOMINAL AORTOGRAM W/LOWER EXTREMITY N/A 10/31/2019   Procedure: ABDOMINAL AORTOGRAM W/LOWER EXTREMITY;  Surgeon: Wellington Hampshire, MD;  Location: [place] [facility];  Service: Cardiovascular;  Laterality: N/A;  . ABDOMINAL AORTOGRAM W/LOWER EXTREMITY Left 05/12/2020   Procedure: ABDOMINAL AORTOGRAM W/LOWER EXTREMITY;  Surgeon: Waynetta Sandy, MD;  Location: [place] [facility];  Service: Cardiovascular;  Laterality: Left;  . AMPUTATION Right 11/02/2019   Procedure: AMPUTATION BELOW KNEE RIGHT;  Surgeon: Rosetta Posner, MD;  Location: [place];  Service: Vascular;  Laterality: Right;  . COLONOSCOPY     x 2  . ENDARTERECTOMY FEMORAL Left 05/21/2020   Procedure: LEFT COMMON FEMORAL ENDARTERECTOMY;  Surgeon: Rosetta Posner, MD;  Location: [facility];  Service: Vascular;  Laterality: Left;  . FEMORAL-POPLITEAL BYPASS GRAFT Left 05/21/2020   Procedure: LEFT FEMORAL TO BELOW KNEE POPLITEAL ARTERY BYPASS GRAFT;  Surgeon: Rosetta Posner, MD;  Location: [facility];  Service: Vascular;  Laterality: Left;  . hysterectomy    . INCISION AND DRAINAGE Left 05/27/14   sebacous cyst, ear  . PRP Left    Dr. Ricki Miller  . removal of cyst from hand    . removal of tumor from foot    . TONSILLECTOMY      There were  no vitals filed for this visit.   Subjective Assessment - 07/10/20 1401    Subjective Pt. reports that the cut off sock has worked really well, and that she cut some of her own socks for the same purpose. She also verbalized that she knows she has to wear a regular sock on top of her cut off sock. She also states that she notcied she is walking better today. No reports of pain and no falls.    Limitations Lifting;Standing;Walking;House hold activities    Patient Stated Goals To use prosthesis to walk without device, take care of myself, live alone. Go out in community.    Currently in Pain? No/denies    Pain Score 0-No pain              [facility] Adult PT Treatment/Exercise - 07/10/20 1405      Ambulation/Gait   Ambulation/Gait Yes    Ambulation/Gait Assistance 5: Supervision;4: Min guard    Ambulation/Gait Assistance Details pt. was supervision during straightaways but min. gurad was utilized around curves secondary to pt's LOB and clicking of heels. Added by supervising PTA- cues provided to increase base of support with gait. Pt noted to be more off balance/stagger when engaged in dual task with gait such as scanning, or having conversation. Will benefit from continued challenges of dual/multi tasking with cane/prosthesis.    Ambulation Distance (Feet) 230 Feet   x 1   Assistive device Prosthesis;Straight cane;Rollator    Gait Pattern Step-through pattern;Decreased arm swing - right;Decreased stance time - right    Ambulation Surface Level;Indoor    Gait Comments pt. ambulated in and out of clinic with rollator, and with a straight cane during session      High Level Balance   High Level Balance Activities Side stepping;Backward walking;Marching forwards;Other (comment)   weight shifting   High Level Balance Comments pt. performed sidestepping and fwd march with bwd walking for 3 laps ea; wearing prosthesis and unilateral UE support on the countertop. VC's needed for technique, supervision needed. Pt. then performed 20 reps of toe taps to a cone with the LLE to promote weight shifting onto the RLE; striaght cane used in left hand and min. guard provided. Pt. then performed 3 laps at the countertop of stepping over 3 bolsters about 2 in. tall with straight cane in the LUE; CGA provided for this and verbal cues needed for sequencing of cane and then prosthesis               PT Short Term Goals - 06/19/20 0810      PT SHORT TERM GOAL #1   Title Patient verbalizes how to manage limb pain / discomfort including adjusting ply socks with limb volume changes with prosthesis. (All STGs Target Date: 07/18/2020)    Time 1     Period Months    Status Revised    Target Date 07/18/20      PT SHORT TERM GOAL #2   Title Patient tolerates wear of prosthesis >12 hrs total / day & verbalizes adjusting assistive device with limb pain </= 2/10 including outside of PT.    Time 1    Period Months    Status Revised    Target Date 07/18/20      PT SHORT TERM GOAL #3   Title Berg Balance >36/56    Time 1    Period Months    Status On-going    Target Date 07/18/20      PT SHORT TERM  GOAL #4   Title Patient ambulates 200' with cane & prosthesis with supervision.    Baseline --    Time 1    Period Months    Status On-going    Target Date 07/18/20      PT SHORT TERM GOAL #5   Title Patient negotiates stairs with single rail, ramps & curbs with cane & prosthesis with minA.    Baseline --    Time 1    Period Months    Status On-going    Target Date 07/18/20             PT Long Term Goals - 06/19/20 0815      PT LONG TERM GOAL #1   Title Patient verbalizes & demonstrates understanding of prosthetic care including modifying assistive device to manage limb pain & diabetic foot care to enable safe utilization. (All LTGs Target Date: 09/12/2020)    Time 12    Period Weeks    Status Revised    Target Date 09/12/20      PT LONG TERM GOAL #2   Title Patient tolerates prosthesis wear >90% of awake hours & verbalizes appropriate assistive device for environment without skin or residual limb pain to enable function throughout her day.    Time 12    Period Weeks    Status Revised    Target Date 09/12/20      PT LONG TERM GOAL #3   Title Berg Balance >/= 45/56 to indicate lower fall risk.    Time 12    Period Weeks    Status On-going    Target Date 09/12/20      PT LONG TERM GOAL #4   Title Patient ambulates 500' outdoors including grass with cane or less & prosthesis modified independent to enable community mobility.    Time 12    Period Weeks    Status On-going    Target Date 09/12/20      PT LONG  TERM GOAL #5   Title Patient negotiates ramps, curbs & stairs with single rail with cane or less & prosthesis modified independent to enable community access.    Time 12    Period Weeks    Status On-going    Target Date 09/12/20      PT LONG TERM GOAL #6   Title Patient demonstrates skills to enable her to return to gardening including kneeling with UE support like garden kneel bench or chair.    Time 12    Period Weeks    Status On-going    Target Date 09/12/20      PT LONG TERM GOAL #7   Title Patient ambulates 100' with prosthesis only carrying household items in [place] negotiating furniture modified independent.    Time 12    Period Weeks    Status New    Target Date 09/12/20                 Plan - 07/10/20 1453    Clinical Impression Statement Todays therapy session placed emphasis on gait training with prosthesis as well as with a straight cane. Various exercises were performed to challenge the pt's balance and weight shifting onto the RLE. Pt. still requires VC's for sequencing during gait, specifically when stepping over obstacles. Pt. demonstrated frustration today with inability to find shoes that equally fit the L foot and the prosthetic foot; pt. was educated on a website that allows her to order 2 different size shoes. Pt. will benefit  form further PT to continue progressing toward goals and improved functional mobility. Added by supervising PTA- PTA will call prosthetist to notify him of pt's concerns over different size feet/shoe needs to see if the foot can be exchanged for a smaller size.    Personal Factors and Comorbidities Age;Comorbidity 3+;Fitness;Time since onset of injury/illness/exacerbation;Transportation    Comorbidities Right TTA, HTN, DM, CVA, PAD, HLD, CKD st2,    Examination-Activity Limitations Locomotion Level;Stairs;Stand;Transfers;Toileting    Examination-Participation Restrictions Community Activity;Yard Work;Shop    Stability/Clinical Decision  Making Evolving/Moderate complexity    Rehab Potential Good    PT Frequency 2x / week    PT Duration 12 weeks    PT Treatment/Interventions ADLs/Self Care Home Management;DME Instruction;Gait training;Stair training;Functional mobility training;Therapeutic activities;Therapeutic exercise;Balance training;Neuromuscular re-education;Patient/family education;Prosthetic Training;Vestibular;Manual techniques    PT Next Visit Plan Continue with prosthetic training with SPC and weight shifting activities to the R    Consulted and Agree with Plan of Care Patient           Patient will benefit from skilled therapeutic intervention in order to improve the following deficits and impairments:  Abnormal gait, Decreased activity tolerance, Decreased balance, Decreased endurance, Decreased knowledge of use of DME, Decreased mobility, Decreased skin integrity, Decreased strength, Postural dysfunction, Prosthetic Dependency, Pain  Visit Diagnosis: Other abnormalities of gait and mobility  Muscle weakness (generalized)  Unsteadiness on feet     Problem List Patient Active Problem List   Diagnosis Date Noted  . PAD (peripheral artery disease) ([place]) 05/21/2020  . Acute metabolic encephalopathy 92/42/6834  . COVID-19 virus infection 02/08/2020  . Acute kidney injury superimposed on CKD ([place]) 02/08/2020  . Status post below-knee amputation of right lower extremity ([place]) 11/07/2019  . Gangrene of right foot ([place])   . Hyponatremia 10/29/2019  . Chronic diastolic CHF (congestive heart failure) ([place]) 10/29/2019  . Stable proliferative diabetic retinopathy of both eyes associated with type 2 diabetes mellitus ([place]) 06/05/2018  . Acute CVA (cerebrovascular accident) ([place]) 02/18/2017  . Acute ischemic stroke ([place])   . Internuclear ophthalmoplegia of left eye   . Benign paroxysmal positional vertigo   . Abnormality of gait   . Acute onset of severe vertigo 02/17/2017  . Vertigo 02/17/2017  .  Atherosclerosis of native artery of extremity with intermittent claudication ([place]) 03/11/2014  . Diabetic retinopathy ([place]) 03/11/2014  . Retinal hemorrhage due to secondary diabetes ([place]) 03/11/2014  . Type II diabetes mellitus with renal manifestations, uncontrolled ([place]) 03/11/2014  . Chronic hepatitis C without hepatic coma ([place]) 03/11/2014  . Hyperlipidemia 03/11/2014  . Hypoglycemia 04/16/2013  . Disorder of bone and cartilage, unspecified   . Other and unspecified hyperlipidemia   . Essential hypertension, benign   . Atherosclerosis of native artery of extremity ([place])   . Chronic kidney disease (CKD), stage II (mild)   . Peripheral arterial disease ([place])   . Anemia   . Postherpetic neuralgia   . Hypertension     Royann Shivers, [place] 07/10/2020, 2:59 PM  [facility] [address] [place], [place], [address] Phone: [phone]   Fax:  [phone]  Name: Monique Cabrera MRN: [id number] Date of Birth: 1935-03-20   This note has been reviewed and edited by supervising CI.  Willow Ora, PTA, [facility] [address], [place], [place] [address] [phone] 07/11/20, 10:27 AM

## 2020-07-14 ENCOUNTER — Ambulatory Visit: Payer: Medicare Other

## 2020-07-15 DIAGNOSIS — Z7689 Persons encountering health services in other specified circumstances: Secondary | ICD-10-CM | POA: Diagnosis not present

## 2020-07-16 ENCOUNTER — Encounter: Payer: Self-pay | Admitting: Adult Health

## 2020-07-16 ENCOUNTER — Ambulatory Visit: Payer: Medicare Other | Admitting: Adult Health

## 2020-07-16 NOTE — Progress Notes (Deleted)
[facility] [address] [place]. [place] [address] [phone]       OFFICE FOLLOW UP NOTE  [name] Date of Birth:  1935/05/12 Medical Record Number:  [id number]   Reason for Referral: Seizure-like episode follow up    CHIEF COMPLAINT:  No chief complaint on file.   HPI:   Today, 07/16/2020, Monique Cabrera returns for follow-up visit regarding hospitalization in February for episode concerning of seizure with Todd's paralysis.  Denies recurrent seizure type activity.  History of stroke without new stroke/TIA symptoms remaining on DAPT and atorvastatin for secondary stroke prevention of side effects.  Blood pressure today ***.  Since prior visit, underwent left femoral endarterectomy and left femoral to below-knee popliteal bypass for critical limb ischemia and continues to follow with Dr. Donnetta Hutching vascular surgery.  No concerns at this time.   History provided for reference purposes only Initial visit 03/26/2020 JM: Monique Cabrera is being seen today for hospital follow-up unaccompanied.  She has been stable since discharge and has since returned home.  She has not continued on keppra due to confusion of indication for medications with patient reporting being told by other providers medication not indicated. She has not had any recurring symptoms or seizure activity since discharge.  She is currently living alone but is in the process of making plans to move in with daughter due to safety concerns.  No concerns at this time.  Hospital admission 02/08/2020: Monique Cabrera is a 85 y.o. female with history of stroke (2018), DM 2, PAD, Rt BKA, CKD 2, HTN and recent cofusion who presented on 02/08/2020 to [facility] ED as a code stroke for right side weakness, and unresponsiveness.    Evaluated by Dr. Erlinda Hong with acute AMS concerning for seizure with Todd's paralysis.  CT head and MRI did not show acute abnormality.  EEG moderate to severe diffuse encephalopathy without seizures  or epileptiform.  Recommended continuation of DAPT for secondary stroke prevention.  Was started on Keppra 250 mg twice daily for seizure prevention.  COVID-19 positive during mission.  Prior history of stroke 01/2017.  HTN stable.  LDL 60 with continuation of atorvastatin 20 mg daily.  DM with A1c 11.0 and mild DKA on admission.  Other stroke risk factors include former tobacco use, and PAD w/ R BKA.  Discharged to [facility] in stable condition.  Acute AMS - concerning for seizure with Todd's paralysis   Presented with unresponsive, found down at home with b/b incontinence as well as right sided weakness, now resolved.  CT Head - No acute intracranial abnormality or significant interval change.   MRI head - No acute finding.  CTA H&N - no high grade stenosis or occlusion.  Left M2 stenosis unchanged.  2D Echo - EF 60 - 65%. No cardiac source of emboli identified.   LE dopplers neg DVT   EEG 2/12 - moderate to severe diffuse encephalopathy, nonspecific etiology. No seizures or epileptiform    LDL - 60  HgbA1c - 11.0  VTE prophylaxis - Lovenox 30   Aspirin 81 mg daily and Plavix 75 mg daily prior to admission, now on aspirin 81 mg daily and clopidogrel 75 mg daily. Continue on discharge  Started on  keppra 250 bid, tolerating well, continue on discharge         ROS:   14 system review of systems performed and negative with exception of no complaints  PMH:  Past Medical History:  Diagnosis Date  Acute upper respiratory infections of unspecified site    Amputee 08/2019   Anemia    Anemia, unspecified    Arthritis    Atherosclerosis of native arteries of the extremities, unspecified    Chest pain, unspecified    Chronic kidney disease (CKD), stage II (mild)     Kidney   Diabetes mellitus without complication (HCC)    Type II   Diarrhea    Disorder of bone and cartilage, unspecified    Herpes zoster with other nervous system  complications(053.19)    History of blood transfusion    Hypercalcemia    Hypertension    Hypertensive renal disease, benign    Nonspecific reaction to tuberculin skin test without active tuberculosis(795.51)    Other and unspecified hyperlipidemia    PAD (peripheral artery disease) ([place])    Per records from [facility]   Pain in joint, lower leg    Peripheral arterial disease ([place])    Postherpetic neuralgia    Proteinuria    Stroke ([place]) 01/2017   Type II or unspecified type diabetes mellitus with renal manifestations, uncontrolled(250.42)    Unspecified disorder of kidney and ureter     PSH:  Past Surgical History:  Procedure Laterality Date   ABDOMINAL AORTOGRAM W/LOWER EXTREMITY N/A 10/31/2019   Procedure: ABDOMINAL AORTOGRAM W/LOWER EXTREMITY;  Surgeon: Wellington Hampshire, MD;  Location: [facility];  Service: Cardiovascular;  Laterality: N/A;   ABDOMINAL AORTOGRAM W/LOWER EXTREMITY Left 05/12/2020   Procedure: ABDOMINAL AORTOGRAM W/LOWER EXTREMITY;  Surgeon: Waynetta Sandy, MD;  Location: [facility];  Service: Cardiovascular;  Laterality: Left;   AMPUTATION Right 11/02/2019   Procedure: AMPUTATION BELOW KNEE RIGHT;  Surgeon: Rosetta Posner, MD;  Location: [facility] OR;  Service: Vascular;  Laterality: Right;   COLONOSCOPY     x 2   ENDARTERECTOMY FEMORAL Left 05/21/2020   Procedure: LEFT COMMON FEMORAL ENDARTERECTOMY;  Surgeon: Rosetta Posner, MD;  Location: [facility] OR;  Service: Vascular;  Laterality: Left;   FEMORAL-POPLITEAL BYPASS GRAFT Left 05/21/2020   Procedure: LEFT FEMORAL TO BELOW KNEE POPLITEAL ARTERY BYPASS GRAFT;  Surgeon: Rosetta Posner, MD;  Location: [facility] OR;  Service: Vascular;  Laterality: Left;   hysterectomy     INCISION AND DRAINAGE Left 05/27/14   sebacous cyst, ear   PRP Left    Dr. Ricki Miller   removal of cyst from hand     removal of tumor from foot     TONSILLECTOMY      Social History:  Social History    Socioeconomic History   Marital status: Widowed    Spouse name: Not on file   Number of children: 6   Years of education: 15   Highest education level: Not on file  Occupational History    Comment: retired, [employer]  Tobacco Use   Smoking status: Former Smoker    Types: Cigarettes   Smokeless tobacco: Never Used   Tobacco comment: Quit about age 52  " it was really a habit"  Vaping Use   Vaping Use: Never used  Substance and Sexual Activity   Alcohol use: No    Alcohol/week: 0.0 standard drinks   Drug use: No   Sexual activity: Never  Other Topics Concern   Not on file  Social History Narrative   Has moved in with her daughter.   Caffeine- coffee 2-3 cups daily, soda off and on   Social Determinants of Health   Financial Resource Strain:  Difficulty of Paying Living Expenses:   Food Insecurity:    Worried About Charity fundraiser in the Last Year:    Arboriculturist in the Last Year:   Transportation Needs:    Film/video editor (Medical):    Lack of Transportation (Non-Medical):   Physical Activity:    Days of Exercise per Week:    Minutes of Exercise per Session:   Stress:    Feeling of Stress :   Social Connections:    Frequency of Communication with Friends and Family:    Frequency of Social Gatherings with Friends and Family:    Attends Religious Services:    Active Member of Clubs or Organizations:    Attends Music therapist:    Marital Status:   Intimate Partner Violence:    Fear of Current or Ex-Partner:    Emotionally Abused:    Physically Abused:    Sexually Abused:     Family History:  Family History  Problem Relation Age of Onset   Diabetes Mother    Diabetes Father    Diabetes Sister    Diabetes Sister     Medications:   Current Outpatient Medications on File Prior to Visit  Medication Sig Dispense Refill   amLODipine (NORVASC) 10 MG tablet TAKE 1 TABLET (10 MG TOTAL) BY MOUTH DAILY.  FOR HIGH BLOOD PRESSURE 90 tablet 1   ASPIRIN LOW DOSE 81 MG EC tablet Take 81 mg by mouth daily.     atorvastatin (LIPITOR) 20 MG tablet TAKE 1 TABLET BY MOUTH EVERY DAY (Patient taking differently: Take 20 mg by mouth daily. ) 90 tablet 1   carvedilol (COREG) 25 MG tablet TAKE 1 TABLET BY MOUTH TWICE A DAY WITH MEALS (Patient taking differently: Take 25 mg by mouth in the morning and at bedtime. ) 180 tablet 1   clopidogrel (PLAVIX) 75 MG tablet TAKE 1 TABLET BY MOUTH EVERY DAY 90 tablet 1   feeding supplement, ENSURE ENLIVE, (ENSURE ENLIVE) LIQD Take 237 mLs by mouth 3 (three) times daily between meals. 237 mL 12   feeding supplement, GLUCERNA SHAKE, (GLUCERNA SHAKE) LIQD Take 237 mLs by mouth 3 (three) times daily between meals.  0   gabapentin (NEURONTIN) 100 MG capsule Take 100 mg by mouth 3 (three) times daily as needed (pain).  (Patient not taking: Reported on 06/10/2020)     glucose 4 GM chewable tablet Chew 1 tablet (4 g total) by mouth as needed for low blood sugar. 50 tablet 12   HYDROcodone-acetaminophen (NORCO/VICODIN) 5-325 MG tablet Take 1 tablet by mouth every 6 (six) hours as needed for moderate pain. (Patient not taking: Reported on 06/10/2020) 20 tablet 0   insulin lispro (HUMALOG) 100 UNIT/ML injection Inject 0.03 mLs (3 Units total) into the skin 3 (three) times daily after meals. 10 mL 11   Insulin Pen Needle (B-D ULTRAFINE III SHORT PEN) 31G X 8 MM MISC Use to check blood sugar every day. Dx: 11.29; 11.65 100 each 1   LANTUS SOLOSTAR 100 UNIT/ML Solostar Pen Inject 20 Units into the skin daily.     Multiple Vitamin (MULTIVITAMIN WITH MINERALS) TABS tablet Take 1 tablet by mouth daily.     ONETOUCH VERIO test strip USE TO TEST BLOOD SUGAR THREE TIMES DAILY. DX: E11.9 300 strip 3   No current facility-administered medications on file prior to visit.    Allergies:   Allergies  Allergen Reactions   Invokana [Canagliflozin] Itching, Swelling and Other (See  Comments)    Vaginal itching, swelling and irritation   Jardiance [Empagliflozin] Itching, Swelling and Other (See Comments)    Vaginal itching and swelling   Tuberculin Ppd Other (See Comments)    Per records from [facility]      Physical Exam  There were no vitals filed for this visit. There is no height or weight on file to calculate BMI. No exam data present  General: well developed, well nourished,  pleasant elderly African-American female, seated, in no evident distress Head: head normocephalic and atraumatic.   Neck: supple with no carotid or supraclavicular bruits Cardiovascular: regular rate and rhythm, no murmurs Musculoskeletal: no deformity; R BKA Skin:  no rash/petichiae Vascular:  Normal pulses all extremities   Neurologic Exam Mental Status: Awake and fully alert.   Normal speech and language.  Oriented to place and time. Recent and remote memory intact. Attention span, concentration and fund of knowledge appropriate. Mood and affect appropriate.  Cranial Nerves: Pupils equal, briskly reactive to light. Extraocular movements full without nystagmus. Visual fields full to confrontation. Hearing intact. Facial sensation intact. Face, tongue, palate moves normally and symmetrically.  Motor: Normal bulk and tone. Normal strength in all tested extremity muscles. R BKA Sensory.: intact to touch , pinprick , position and vibratory sensation.  Coordination: Rapid alternating movements normal in all extremities. Finger-to-nose performed accurately bilaterally and heel-to-shin unable to perform Gait and Station: Deferred  Reflexes: 1+ and symmetric. Toes downgoing.        ASSESSMENT: SHAMERIA TRIMARCO is a 85 y.o. year old female presented with right-sided weakness and unresponsiveness on 02/08/2020 with acute AMS concerning for seizure with Todd's paralysis and initiated on Keppra. Vascular risk factors include hx of stroke 2018, DM, HTN, HLD, PAD and former tobacco use.   At some point, Keppra discontinued with patient reporting "I was told I did not need this medication as I did not have a seizure".  She has not had any reoccurring seizure like activity    PLAN:  1. New onset seizure with Todd's paralysis: Advised her that possible recurrence of seizure activity not on AED but as she has been stable, she is not interested in restarting Keppra at this time.  Advised her of potential risks and verbalized understanding.  Advised to call office with any possible seizure activity or symptoms.  We will repeat EEG 2. Hx of stroke : Continue aspirin 81 mg daily and clopidogrel 75 mg daily  and atorvastatin for secondary stroke prevention. Maintain strict control of hypertension with blood pressure goal below 130/90, diabetes with hemoglobin A1c goal below 6.5% and cholesterol with LDL cholesterol (bad cholesterol) goal below 70 mg/dL.  I also advised the patient to eat a healthy diet with plenty of whole grains, cereals, fruits and vegetables, exercise regularly with at least 30 minutes of continuous activity daily and maintain ideal body weight.  Continue to follow with PCP for HTN, HLD and DM management    Follow up in 3 months or call earlier if needed   I spent 26 minutes of face-to-face and non-face-to-face time with patient.  This included previsit chart review, lab review, study review, order entry, electronic health record documentation, patient education regarding recent onset seizure and self discontinuing AED and answered all questions to patient satisfaction   Frann Rider, [facility]  [name] [address] [place], [place] [address]  Phone [phone] Fax [phone] Note: This document was prepared with digital dictation and possible smart phrase technology. Any  transcriptional errors that result from this process are unintentional.

## 2020-07-17 ENCOUNTER — Ambulatory Visit: Payer: Medicare Other | Admitting: Physical Therapy

## 2020-07-22 ENCOUNTER — Other Ambulatory Visit: Payer: Self-pay

## 2020-07-22 ENCOUNTER — Encounter: Payer: Self-pay | Admitting: Physical Therapy

## 2020-07-22 ENCOUNTER — Ambulatory Visit: Payer: Medicare Other | Admitting: Physical Therapy

## 2020-07-22 DIAGNOSIS — R2689 Other abnormalities of gait and mobility: Secondary | ICD-10-CM

## 2020-07-22 DIAGNOSIS — R531 Weakness: Secondary | ICD-10-CM | POA: Diagnosis not present

## 2020-07-22 DIAGNOSIS — M6281 Muscle weakness (generalized): Secondary | ICD-10-CM

## 2020-07-22 DIAGNOSIS — R2681 Unsteadiness on feet: Secondary | ICD-10-CM | POA: Diagnosis not present

## 2020-07-22 DIAGNOSIS — R293 Abnormal posture: Secondary | ICD-10-CM | POA: Diagnosis not present

## 2020-07-22 NOTE — Therapy (Addendum)
[facility] [address] [place], [place], [address] Phone: [phone]   Fax:  [phone]  Physical Therapy Treatment  Patient Details  Name: Monique Cabrera MRN: [id number] Date of Birth: 10-Mar-1935 Referring Provider (PT): Curt Jews, MD   Encounter Date: 07/22/2020   PT End of Session - 07/22/20 1317    Visit Number 26    Number of Visits 44    Date for PT Re-Evaluation 09/12/20    Authorization Type [employer] Medicare & Medicaid    Progress Note Due on Visit 30    PT Start Time 1315    PT Stop Time 1400    PT Time Calculation (min) 45 min    Equipment Utilized During Treatment Gait belt    Activity Tolerance Patient tolerated treatment well;No increased pain    Behavior During Therapy WFL for tasks assessed/performed           Past Medical History:  Diagnosis Date  . Acute upper respiratory infections of unspecified site   . Amputee 08/2019  . Anemia   . Anemia, unspecified   . Arthritis   . Atherosclerosis of native arteries of the extremities, unspecified   . Chest pain, unspecified   . Chronic kidney disease (CKD), stage II (mild)    [facility]  . Diabetes mellitus without complication ([place])    Type II  . Diarrhea   . Disorder of bone and cartilage, unspecified   . Herpes zoster with other nervous system complications(053.19)   . History of blood transfusion   . Hypercalcemia   . Hypertension   . Hypertensive renal disease, benign   . Nonspecific reaction to tuberculin skin test without active tuberculosis(795.51)   . Other and unspecified hyperlipidemia   . PAD (peripheral artery disease) ([place])    Per records from [facility]  . Pain in joint, lower leg   . Peripheral arterial disease ([place])   . Postherpetic neuralgia   . Proteinuria   . Stroke ([place]) 01/2017  . Type II or unspecified type diabetes mellitus with renal manifestations, uncontrolled(250.42)   . Unspecified disorder of  kidney and ureter     Past Surgical History:  Procedure Laterality Date  . ABDOMINAL AORTOGRAM W/LOWER EXTREMITY N/A 10/31/2019   Procedure: ABDOMINAL AORTOGRAM W/LOWER EXTREMITY;  Surgeon: Wellington Hampshire, MD;  Location: [facility];  Service: Cardiovascular;  Laterality: N/A;  . ABDOMINAL AORTOGRAM W/LOWER EXTREMITY Left 05/12/2020   Procedure: ABDOMINAL AORTOGRAM W/LOWER EXTREMITY;  Surgeon: Waynetta Sandy, MD;  Location: [facility];  Service: Cardiovascular;  Laterality: Left;  . AMPUTATION Right 11/02/2019   Procedure: AMPUTATION BELOW KNEE RIGHT;  Surgeon: Rosetta Posner, MD;  Location: [facility];  Service: Vascular;  Laterality: Right;  . COLONOSCOPY     x 2  . ENDARTERECTOMY FEMORAL Left 05/21/2020   Procedure: LEFT COMMON FEMORAL ENDARTERECTOMY;  Surgeon: Rosetta Posner, MD;  Location: [facility];  Service: Vascular;  Laterality: Left;  . FEMORAL-POPLITEAL BYPASS GRAFT Left 05/21/2020   Procedure: LEFT FEMORAL TO BELOW KNEE POPLITEAL ARTERY BYPASS GRAFT;  Surgeon: Rosetta Posner, MD;  Location[facility];  Service: Vascular;  Laterality: Left;  . hysterectomy    . INCISION AND DRAINAGE Left 05/27/14   sebacous cyst, ear  . PRP Left    Dr. Ricki Miller  . removal of cyst from hand    . removal of tumor from foot    . TONSILLECTOMY      There were  no vitals filed for this visit.   Subjective Assessment - 07/22/20 1320    Subjective Pt. reports that she got her times mixed up last visit and showed up at 2:00 instead of her appointment time at 12:30. Pt. also reports that she talked to the prosthetist and he is going to get her a new foot since it is so much larger than her L foot. No falls and no changes since last visit.    Limitations Lifting;Standing;Walking;House hold activities    Patient Stated Goals To use prosthesis to walk without device, take care of myself, live alone. Go out in community.    Currently in Pain? No/denies    Pain Score 0-No pain    Multiple Pain  Sites No              [facility] PT Assessment - 07/22/20 1327      Berg Balance Test   Sit to Stand Able to stand  independently using hands    Standing Unsupported Able to stand safely 2 minutes    Sitting with Back Unsupported but Feet Supported on Floor or Stool Able to sit safely and securely 2 minutes    Stand to Sit Controls descent by using hands    Transfers Able to transfer safely, minor use of hands    Standing Unsupported with Eyes Closed Able to stand 10 seconds with supervision    Standing Unsupported with Feet Together Able to place feet together independently and stand for 1 minute with supervision    From Standing, Reach Forward with Outstretched Arm Can reach forward >12 cm safely (5")    From Standing Position, Pick up Object from [place] to pick up shoe, needs supervision    From Standing Position, Turn to Look Behind Over each Shoulder Looks behind one side only/other side shows less weight shift    Turn 360 Degrees Needs close supervision or verbal cueing    Standing Unsupported, Alternately Place Feet on Step/Stool Needs assistance to keep from falling or unable to try    Standing Unsupported, One Foot in Front Able to take small step independently and hold 30 seconds    Standing on One Leg Tries to lift leg/unable to hold 3 seconds but remains standing independently    Total Score 37             [facility] Adult PT Treatment/Exercise - 07/22/20 1404      Transfers   Transfers Sit to Stand;Stand to [employer] Transfers    Sit to Stand 5: Supervision    Stand to Sit 5: Supervision    Stand Pivot Transfers 5: Supervision    Comments Assessed to address STG's      Ambulation/Gait   Ambulation/Gait Yes    Ambulation/Gait Assistance 4: Min assist    Ambulation/Gait Assistance Details pt. was provided with min. assist while ambulating with straight cane secondary to LOB x4. Pt. cued to take wider steps to increase her BOS.    Ambulation Distance (Feet) 250  Feet    Assistive device Straight cane;Prosthesis    Gait Pattern Step-through pattern;Decreased arm swing - left;Narrow base of support    Ambulation Surface Level;Indoor    Stairs Yes    Stairs Assistance 4: Min guard    Stairs Assistance Details (indicate cue type and reason) pt. given cues for technique and sequencing with prosthesis    Stair Management Technique One rail Right;With cane    Number of Stairs 4    Ramp  4: Min assist    Ramp Details (indicate cue type and reason) with prosthesis and cane, minA provided to ensure pt. safety    Curb 4: Min assist    Curb Details (indicate cue type and reason) with prosthesis and cane; minA for pt. safety    Gait Comments Gait, stairs, ramps and curbs assessed to address STG's. Pt. required MinA with everyhting other than stairs secondary to noted unsteadiness with cane.              PT Short Term Goals - 07/22/20 1324      PT SHORT TERM GOAL #1   Title Patient verbalizes how to manage limb pain / discomfort including adjusting ply socks with limb volume changes with prosthesis. (All STGs Target Date: 07/18/2020)    Baseline 07/22/20: pt. verbalized independence with adjusting ply socks with pain/volume changes.    Time 1    Period Months    Status Achieved    Target Date 07/18/20      PT SHORT TERM GOAL #2   Title Patient tolerates wear of prosthesis >12 hrs total / day & verbalizes adjusting assistive device with limb pain </= 2/10 including outside of PT.    Baseline 07/22/20: pt. reports she wears her prosthesis > 12 hrs/day, but her limb pain can get up to a 5/10 intermittently depending on the day.    Time 1    Period Months    Status Partially Met    Target Date 08/18/20      PT SHORT TERM GOAL #3   Title Berg Balance >36/56    Baseline 07/22/20: 37/56    Time 1    Period Months    Status Achieved    Target Date 07/18/20      PT SHORT TERM GOAL #4   Title Patient ambulates 200' with cane & prosthesis with supervision.     Baseline 07/22/20: pt. able to ambulate 250 ft. with cane & prosthesis but with min. assist by SPTA secondary to occasionaly LOB.    Time 1    Period Months    Status Partially Met    Target Date 08/18/20      PT SHORT TERM GOAL #5   Title Patient negotiates stairs with single rail, ramps & curbs with cane & prosthesis with minA.    Baseline 07/22/20: pt. able to negotiate stairs, ramps & curbs with cane & prosthesis with minA.    Time 1    Period Months    Status Achieved    Target Date 07/18/20             PT Long Term Goals - 06/19/20 0815      PT LONG TERM GOAL #1   Title Patient verbalizes & demonstrates understanding of prosthetic care including modifying assistive device to manage limb pain & diabetic foot care to enable safe utilization. (All LTGs Target Date: 09/12/2020)    Time 12    Period Weeks    Status Revised    Target Date 09/12/20      PT LONG TERM GOAL #2   Title Patient tolerates prosthesis wear >90% of awake hours & verbalizes appropriate assistive device for environment without skin or residual limb pain to enable function throughout her day.    Time 12    Period Weeks    Status Revised    Target Date 09/12/20      PT LONG TERM GOAL #3   Title Berg Balance >/= 45/56 to indicate  lower fall risk.    Time 12    Period Weeks    Status On-going    Target Date 09/12/20      PT LONG TERM GOAL #4   Title Patient ambulates 500' outdoors including grass with cane or less & prosthesis modified independent to enable community mobility.    Time 12    Period Weeks    Status On-going    Target Date 09/12/20      PT LONG TERM GOAL #5   Title Patient negotiates ramps, curbs & stairs with single rail with cane or less & prosthesis modified independent to enable community access.    Time 12    Period Weeks    Status On-going    Target Date 09/12/20      PT LONG TERM GOAL #6   Title Patient demonstrates skills to enable her to return to gardening including  kneeling with UE support like garden kneel bench or chair.    Time 12    Period Weeks    Status On-going    Target Date 09/12/20      PT LONG TERM GOAL #7   Title Patient ambulates 100' with prosthesis only carrying household items in [place] negotiating furniture modified independent.    Time 12    Period Weeks    Status New    Target Date 09/12/20              Plan - 07/22/20 1414    Clinical Impression Statement Todays therapy session placed emphasis on checking the pt's STG's. The pt. has met goals #1, 3 and 5, and has made progress toward the other two. The pt. reports that she still does not feel safe or trust herself whenever she is walking outside on uneven surfaces; she also reports that she feels like her biggest priority right now is working on her standing balance, "it was really helping me when I was standing with my eyes closed." The pt. will benefit form further PT in order to continue progressing toward her LTG's to improve her functional independence. Addendum: PT updated STG dates.   Personal Factors and Comorbidities Age;Comorbidity 3+;Fitness;Time since onset of injury/illness/exacerbation;Transportation    Comorbidities Right TTA, HTN, DM, CVA, PAD, HLD, CKD st2,    Examination-Activity Limitations Locomotion Level;Stairs;Stand;Transfers;Toileting    Examination-Participation Restrictions Community Activity;Yard Work;Shop    Stability/Clinical Decision Making Evolving/Moderate complexity    Rehab Potential Good    PT Frequency 2x / week    PT Duration 12 weeks    PT Treatment/Interventions ADLs/Self Care Home Management;DME Instruction;Gait training;Stair training;Functional mobility training;Therapeutic activities;Therapeutic exercise;Balance training;Neuromuscular re-education;Patient/family education;Prosthetic Training;Vestibular;Manual techniques    PT Next Visit Plan Continue with prosthetic training with SPC and weight shifting activities to the R. Work on  [employer] as well as Physicist, medical with Plan of Care Patient           Patient will benefit from skilled therapeutic intervention in order to improve the following deficits and impairments:  Abnormal gait, Decreased activity tolerance, Decreased balance, Decreased endurance, Decreased knowledge of use of DME, Decreased mobility, Decreased skin integrity, Decreased strength, Postural dysfunction, Prosthetic Dependency, Pain  Visit Diagnosis: Other abnormalities of gait and mobility  Muscle weakness (generalized)  Unsteadiness on feet     Problem List Patient Active Problem List   Diagnosis Date Noted  . PAD (peripheral artery disease) ([place]) 05/21/2020  . Acute metabolic encephalopathy 44/31/5400  . COVID-19 virus infection 02/08/2020  .  Acute kidney injury superimposed on CKD ([place]) 02/08/2020  . Status post below-knee amputation of right lower extremity ([place]) 11/07/2019  . Gangrene of right foot ([place])   . Hyponatremia 10/29/2019  . Chronic diastolic CHF (congestive heart failure) ([place]) 10/29/2019  . Stable proliferative diabetic retinopathy of both eyes associated with type 2 diabetes mellitus ([place]) 06/05/2018  . Acute CVA (cerebrovascular accident) ([place]) 02/18/2017  . Acute ischemic stroke ([place])   . Internuclear ophthalmoplegia of left eye   . Benign paroxysmal positional vertigo   . Abnormality of gait   . Acute onset of severe vertigo 02/17/2017  . Vertigo 02/17/2017  . Atherosclerosis of native artery of extremity with intermittent claudication ([place]) 03/11/2014  . Diabetic retinopathy ([place]) 03/11/2014  . Retinal hemorrhage due to secondary diabetes ([place]) 03/11/2014  . Type II diabetes mellitus with renal manifestations, uncontrolled ([place]) 03/11/2014  . Chronic hepatitis C without hepatic coma ([place]) 03/11/2014  . Hyperlipidemia 03/11/2014  . Hypoglycemia 04/16/2013  . Disorder of bone and cartilage, unspecified   . Other and  unspecified hyperlipidemia   . Essential hypertension, benign   . Atherosclerosis of native artery of extremity ([place])   . Chronic kidney disease (CKD), stage II (mild)   . Peripheral arterial disease ([place])   . Anemia   . Postherpetic neuralgia   . Hypertension     Royann Shivers, [place] 07/22/2020, 2:22 PM  Cherly Anderson, PT, DPT, NCS   [name] [address] [place], [place], [address] Phone: [phone]   Fax:  [phone]  Name: Monique Cabrera MRN: [id number] Date of Birth: November 14, 1935

## 2020-07-24 ENCOUNTER — Ambulatory Visit: Payer: Medicare Other | Admitting: Physical Therapy

## 2020-07-24 ENCOUNTER — Other Ambulatory Visit: Payer: Self-pay

## 2020-07-24 ENCOUNTER — Encounter: Payer: Self-pay | Admitting: Physical Therapy

## 2020-07-24 DIAGNOSIS — M6281 Muscle weakness (generalized): Secondary | ICD-10-CM

## 2020-07-24 DIAGNOSIS — R2689 Other abnormalities of gait and mobility: Secondary | ICD-10-CM

## 2020-07-24 DIAGNOSIS — R2681 Unsteadiness on feet: Secondary | ICD-10-CM

## 2020-07-24 DIAGNOSIS — R293 Abnormal posture: Secondary | ICD-10-CM

## 2020-07-24 DIAGNOSIS — R531 Weakness: Secondary | ICD-10-CM | POA: Diagnosis not present

## 2020-07-25 ENCOUNTER — Encounter: Payer: Medicare Other | Admitting: Nurse Practitioner

## 2020-07-25 ENCOUNTER — Telehealth: Payer: Self-pay

## 2020-07-25 NOTE — Telephone Encounter (Signed)
I called patient x 3 today in attempt to complete her Annual Wellness Visit via telephone call.  Patients phone number was not working and gave a continuous busy signal.  I called patients son [name] and left a message for him to have patient return call.  Patients Annual Wellness Visit will be canceled as a result of unable to connect with patient.   Someone with in the administrative staff will contact patient to reschedule per [facility] missed appointment protocol.

## 2020-07-25 NOTE — Therapy (Signed)
[facility] [address] [place], [place], [address] Phone: [phone]   Fax:  [phone]  Physical Therapy Treatment  Patient Details  Name: Monique Cabrera MRN: [id number] Date of Birth: 1935/06/28 Referring Provider (PT): Curt Jews, MD   Encounter Date: 07/24/2020   PT End of Session - 07/24/20 1409    Visit Number 27    Number of Visits 65    Date for PT Re-Evaluation 09/12/20    Authorization Type [employer] Medicare & Medicaid    Progress Note Due on Visit 30    PT Start Time 1402    PT Stop Time 1443    PT Time Calculation (min) 41 min    Equipment Utilized During Treatment Gait belt    Activity Tolerance Patient tolerated treatment well;No increased pain    Behavior During Therapy WFL for tasks assessed/performed           Past Medical History:  Diagnosis Date  . Acute upper respiratory infections of unspecified site   . Amputee 08/2019  . Anemia   . Anemia, unspecified   . Arthritis   . Atherosclerosis of native arteries of the extremities, unspecified   . Chest pain, unspecified   . Chronic kidney disease (CKD), stage II (mild)    [facility]  . Diabetes mellitus without complication ([place])    Type II  . Diarrhea   . Disorder of bone and cartilage, unspecified   . Herpes zoster with other nervous system complications(053.19)   . History of blood transfusion   . Hypercalcemia   . Hypertension   . Hypertensive renal disease, benign   . Nonspecific reaction to tuberculin skin test without active tuberculosis(795.51)   . Other and unspecified hyperlipidemia   . PAD (peripheral artery disease) ([place])    Per records from [name]  . Pain in joint, lower leg   . Peripheral arterial disease ([place])   . Postherpetic neuralgia   . Proteinuria   . Stroke ([place]) 01/2017  . Type II or unspecified type diabetes mellitus with renal manifestations, uncontrolled(250.42)   . Unspecified disorder of  kidney and ureter     Past Surgical History:  Procedure Laterality Date  . ABDOMINAL AORTOGRAM W/LOWER EXTREMITY N/A 10/31/2019   Procedure: ABDOMINAL AORTOGRAM W/LOWER EXTREMITY;  Surgeon: Wellington Hampshire, MD;  Location: [facility];  Service: Cardiovascular;  Laterality: N/A;  . ABDOMINAL AORTOGRAM W/LOWER EXTREMITY Left 05/12/2020   Procedure: ABDOMINAL AORTOGRAM W/LOWER EXTREMITY;  Surgeon: Waynetta Sandy, MD;  Location: [facility];  Service: Cardiovascular;  Laterality: Left;  . AMPUTATION Right 11/02/2019   Procedure: AMPUTATION BELOW KNEE RIGHT;  Surgeon: Rosetta Posner, MD;  Location: [facility];  Service: Vascular;  Laterality: Right;  . COLONOSCOPY     x 2  . ENDARTERECTOMY FEMORAL Left 05/21/2020   Procedure: LEFT COMMON FEMORAL ENDARTERECTOMY;  Surgeon: Rosetta Posner, MD;  Location: [facility];  Service: Vascular;  Laterality: Left;  . FEMORAL-POPLITEAL BYPASS GRAFT Left 05/21/2020   Procedure: LEFT FEMORAL TO BELOW KNEE POPLITEAL ARTERY BYPASS GRAFT;  Surgeon: Rosetta Posner, MD;  Location: [facility];  Service: Vascular;  Laterality: Left;  . hysterectomy    . INCISION AND DRAINAGE Left 05/27/14   sebacous cyst, ear  . PRP Left    Dr. Ricki Miller  . removal of cyst from hand    . removal of tumor from foot    . TONSILLECTOMY      There were  no vitals filed for this visit.   Subjective Assessment - 07/24/20 1406    Subjective No new complaints. No falls or pain to report. Gets the new foot next Tuesday, excited to go buy new shoes afterwards.    Limitations Lifting;Standing;Walking;House hold activities    Patient Stated Goals To use prosthesis to walk without device, take care of myself, live alone. Go out in community.    Currently in Pain? No/denies    Pain Score 0-No pain                    [facility] Adult PT Treatment/Exercise - 07/24/20 1411      Transfers   Transfers Sit to Stand;Stand to Sit    Sit to Stand 5: Supervision;With upper extremity  assist;From chair/3-in-1    Stand to Sit 5: Supervision;With upper extremity assist;To chair/3-in-1      Ambulation/Gait   Ambulation/Gait Yes    Ambulation/Gait Assistance 4: Min guard;4: Min assist    Ambulation/Gait Assistance Details 1st gait rep of 230 feet included scanning all directions randomly with min guard to min assist for balance. cues to increase base of support and for cane placement needed. proceeded to use cane around gym with session with cues as stated for shorter distances.     Ambulation Distance (Feet) 230 Feet   x1, plus around gym with session   Assistive device Straight cane;Prosthesis    Gait Pattern Step-through pattern;Decreased arm swing - left;Narrow base of support    Ambulation Surface Level;Indoor      Prosthetics   Prosthetic Care Comments  Using antiperspirant during the day with liner. No significant issues with moisture/sweating with use of this. Using the liner with the longer pin on it today, reports this is easier to use that the other liner with a shorter pin. Taking the liner with shorter pin to prosthetist on Tuesday to get longer pin placed on it.     Current prosthetic wear tolerance (days/week)  daily    Current prosthetic wear tolerance (#hours/day)  Pt reports that she is wearing her leg pretty much all day as long as it is not bothering her. Takes off around 8 at night.     Residual limb condition  No issues with skin integrity    Education Provided Residual limb care;Proper wear schedule/adjustment;Proper weight-bearing schedule/adjustment    Person(s) Educated Patient    Education Method Explanation;Verbal cues;Demonstration    Education Method Verbalized understanding;Verbal cues required;Needs further instruction    Donning Prosthesis Supervision    Doffing Prosthesis Modified independent (device/increased time)               Balance Exercises - 07/24/20 1423      Balance Exercises: Standing   Standing Eyes Opened Wide  ([place]);Foam/compliant surface;Other reps (comment);Limitations    Standing Eyes Opened Limitations on airex with feet hip width apart: alternating UE raises, progressing to bil UE raises for ~10 reps each, min guard assist for balance with cues on posture.      Standing Eyes Closed Foam/compliant surface;Wide (BOA);Head turns;30 secs;Limitations    Standing Eyes Closed Limitations on airex with feet hip width apart: EC no head movements, progressing to EC head movements left<>right, then up<>down for ~10 reps each, min guard to min assist for balance. cues on posture and weight shifting to assist with balance recovery.     Sidestepping Foam/compliant support;3 reps;Limitations    Sidestepping Limitations on blue mat in parallel bars with intermittent touch to bars for  balance. cues for step length/height, posture and weight shifting.                PT Short Term Goals - 07/22/20 1324      PT SHORT TERM GOAL #1   Title Patient verbalizes how to manage limb pain / discomfort including adjusting ply socks with limb volume changes with prosthesis. (All STGs Target Date: 07/18/2020)    Baseline 07/22/20: pt. verbalized independence with adjusting ply socks with pain/volume changes.    Time 1    Period Months    Status Achieved    Target Date 07/18/20      PT SHORT TERM GOAL #2   Title Patient tolerates wear of prosthesis >12 hrs total / day & verbalizes adjusting assistive device with limb pain </= 2/10 including outside of PT.    Baseline 07/22/20: pt. reports she wears her prosthesis > 12 hrs/day, but her limb pain can get up to a 5/10 intermittently depending on the day.    Time 1    Period Months    Status Partially Met    Target Date 07/18/20      PT SHORT TERM GOAL #3   Title Berg Balance >36/56    Baseline 07/22/20: 37/56    Time 1    Period Months    Status Achieved    Target Date 07/18/20      PT SHORT TERM GOAL #4   Title Patient ambulates 200' with cane & prosthesis with  supervision.    Baseline 07/22/20: pt. able to ambulate 250 ft. with cane & prosthesis but with min. assist by SPTA secondary to occasionaly LOB.    Time 1    Period Months    Status Partially Met    Target Date 07/18/20      PT SHORT TERM GOAL #5   Title Patient negotiates stairs with single rail, ramps & curbs with cane & prosthesis with minA.    Baseline 07/22/20: pt. able to negotiate stairs, ramps & curbs with cane & prosthesis with minA.    Time 1    Period Months    Status Achieved    Target Date 07/18/20             PT Long Term Goals - 06/19/20 0815      PT LONG TERM GOAL #1   Title Patient verbalizes & demonstrates understanding of prosthetic care including modifying assistive device to manage limb pain & diabetic foot care to enable safe utilization. (All LTGs Target Date: 09/12/2020)    Time 12    Period Weeks    Status Revised    Target Date 09/12/20      PT LONG TERM GOAL #2   Title Patient tolerates prosthesis wear >90% of awake hours & verbalizes appropriate assistive device for environment without skin or residual limb pain to enable function throughout her day.    Time 12    Period Weeks    Status Revised    Target Date 09/12/20      PT LONG TERM GOAL #3   Title Berg Balance >/= 45/56 to indicate lower fall risk.    Time 12    Period Weeks    Status On-going    Target Date 09/12/20      PT LONG TERM GOAL #4   Title Patient ambulates 500' outdoors including grass with cane or less & prosthesis modified independent to enable community mobility.    Time 12    Period Weeks  Status On-going    Target Date 09/12/20      PT LONG TERM GOAL #5   Title Patient negotiates ramps, curbs & stairs with single rail with cane or less & prosthesis modified independent to enable community access.    Time 12    Period Weeks    Status On-going    Target Date 09/12/20      PT LONG TERM GOAL #6   Title Patient demonstrates skills to enable her to return to  gardening including kneeling with UE support like garden kneel bench or chair.    Time 12    Period Weeks    Status On-going    Target Date 09/12/20      PT LONG TERM GOAL #7   Title Patient ambulates 100' with prosthesis only carrying household items in [place] negotiating furniture modified independent.    Time 12    Period Weeks    Status New    Target Date 09/12/20                 Plan - 07/24/20 1409    Clinical Impression Statement Today's skilled session continued to focus on gait with cane/prosthesis and balance reactions with decreased UE support. No issues noted or reported in session today. The pt is progressing toward goals and should benefit from continued PT to progress toward unmet goals.    Personal Factors and Comorbidities Age;Comorbidity 3+;Fitness;Time since onset of injury/illness/exacerbation;Transportation    Comorbidities Right TTA, HTN, DM, CVA, PAD, HLD, CKD st2,    Examination-Activity Limitations Locomotion Level;Stairs;Stand;Transfers;Toileting    Examination-Participation Restrictions Community Activity;Yard Work;Shop    Stability/Clinical Decision Making Evolving/Moderate complexity    Rehab Potential Good    PT Frequency 2x / week    PT Duration 12 weeks    PT Treatment/Interventions ADLs/Self Care Home Management;DME Instruction;Gait training;Stair training;Functional mobility training;Therapeutic activities;Therapeutic exercise;Balance training;Neuromuscular re-education;Patient/family education;Prosthetic Training;Vestibular;Manual techniques    PT Next Visit Plan Continue with prosthetic training with SPC and weight shifting activities to the R. Work on [employer] as well as Physicist, medical with Plan of Care Patient           Patient will benefit from skilled therapeutic intervention in order to improve the following deficits and impairments:  Abnormal gait, Decreased activity tolerance, Decreased balance,  Decreased endurance, Decreased knowledge of use of DME, Decreased mobility, Decreased skin integrity, Decreased strength, Postural dysfunction, Prosthetic Dependency, Pain  Visit Diagnosis: Other abnormalities of gait and mobility  Muscle weakness (generalized)  Unsteadiness on feet  Weakness generalized  Abnormal posture     Problem List Patient Active Problem List   Diagnosis Date Noted  . PAD (peripheral artery disease) ([place]) 05/21/2020  . Acute metabolic encephalopathy 44/02/4741  . COVID-19 virus infection 02/08/2020  . Acute kidney injury superimposed on CKD ([place]) 02/08/2020  . Status post below-knee amputation of right lower extremity ([place]) 11/07/2019  . Gangrene of right foot ([place])   . Hyponatremia 10/29/2019  . Chronic diastolic CHF (congestive heart failure) ([place]) 10/29/2019  . Stable proliferative diabetic retinopathy of both eyes associated with type 2 diabetes mellitus ([place]) 06/05/2018  . Acute CVA (cerebrovascular accident) ([place]) 02/18/2017  . Acute ischemic stroke ([place])   . Internuclear ophthalmoplegia of left eye   . Benign paroxysmal positional vertigo   . Abnormality of gait   . Acute onset of severe vertigo 02/17/2017  . Vertigo 02/17/2017  . Atherosclerosis of native artery of extremity with intermittent claudication (  [place]) 03/11/2014  . Diabetic retinopathy ([place]) 03/11/2014  . Retinal hemorrhage due to secondary diabetes ([place]) 03/11/2014  . Type II diabetes mellitus with renal manifestations, uncontrolled ([place]) 03/11/2014  . Chronic hepatitis C without hepatic coma ([place]) 03/11/2014  . Hyperlipidemia 03/11/2014  . Hypoglycemia 04/16/2013  . Disorder of bone and cartilage, unspecified   . Other and unspecified hyperlipidemia   . Essential hypertension, benign   . Atherosclerosis of native artery of extremity ([place])   . Chronic kidney disease (CKD), stage II (mild)   . Peripheral arterial disease ([place])   . Anemia   . Postherpetic neuralgia   .  Hypertension     [place], [place], [place] [address], [place], [place] [address] [phone] 07/25/20, 11:31 AM   Name: Monique Cabrera MRN: [id number] Date of Birth: [birth year]

## 2020-07-28 NOTE — Progress Notes (Signed)
This encounter was created in error - please disregard.

## 2020-07-30 ENCOUNTER — Ambulatory Visit: Payer: Medicare Other | Attending: Vascular Surgery | Admitting: Physical Therapy

## 2020-07-30 DIAGNOSIS — M6281 Muscle weakness (generalized): Secondary | ICD-10-CM | POA: Insufficient documentation

## 2020-07-30 DIAGNOSIS — R2681 Unsteadiness on feet: Secondary | ICD-10-CM | POA: Insufficient documentation

## 2020-07-30 DIAGNOSIS — R2689 Other abnormalities of gait and mobility: Secondary | ICD-10-CM | POA: Insufficient documentation

## 2020-07-30 DIAGNOSIS — R293 Abnormal posture: Secondary | ICD-10-CM | POA: Insufficient documentation

## 2020-07-30 DIAGNOSIS — R531 Weakness: Secondary | ICD-10-CM | POA: Insufficient documentation

## 2020-08-01 ENCOUNTER — Other Ambulatory Visit: Payer: Self-pay

## 2020-08-01 ENCOUNTER — Ambulatory Visit: Payer: Medicare Other | Admitting: Physical Therapy

## 2020-08-01 ENCOUNTER — Encounter: Payer: Self-pay | Admitting: Physical Therapy

## 2020-08-01 DIAGNOSIS — M6281 Muscle weakness (generalized): Secondary | ICD-10-CM | POA: Diagnosis not present

## 2020-08-01 DIAGNOSIS — R2681 Unsteadiness on feet: Secondary | ICD-10-CM

## 2020-08-01 DIAGNOSIS — R293 Abnormal posture: Secondary | ICD-10-CM | POA: Diagnosis not present

## 2020-08-01 DIAGNOSIS — R2689 Other abnormalities of gait and mobility: Secondary | ICD-10-CM

## 2020-08-01 DIAGNOSIS — R531 Weakness: Secondary | ICD-10-CM | POA: Diagnosis not present

## 2020-08-01 NOTE — Therapy (Signed)
[facility] [address] [place], [place], [address] Phone: [phone]   Fax:  [phone]  Physical Therapy Treatment  Patient Details  Name: Monique Cabrera MRN: [id number] Date of Birth: 09/07/1935 Referring Provider (PT): Curt Jews, MD   Encounter Date: 08/01/2020   PT End of Session - 08/01/20 1406    Visit Number 28    Number of Visits 44    Date for PT Re-Evaluation 09/12/20    Authorization Type [employer] Medicare & Medicaid    Progress Note Due on Visit 30    PT Start Time 1402    PT Stop Time 1445    PT Time Calculation (min) 43 min    Equipment Utilized During Treatment Gait belt    Activity Tolerance Patient tolerated treatment well;No increased pain    Behavior During Therapy WFL for tasks assessed/performed           Past Medical History:  Diagnosis Date  . Acute upper respiratory infections of unspecified site   . Amputee 08/2019  . Anemia   . Anemia, unspecified   . Arthritis   . Atherosclerosis of native arteries of the extremities, unspecified   . Chest pain, unspecified   . Chronic kidney disease (CKD), stage II (mild)    [facility]  . Diabetes mellitus without complication ([place])    Type II  . Diarrhea   . Disorder of bone and cartilage, unspecified   . Herpes zoster with other nervous system complications(053.19)   . History of blood transfusion   . Hypercalcemia   . Hypertension   . Hypertensive renal disease, benign   . Nonspecific reaction to tuberculin skin test without active tuberculosis(795.51)   . Other and unspecified hyperlipidemia   . PAD (peripheral artery disease) ([place])    Per records from [facility]  . Pain in joint, lower leg   . Peripheral arterial disease ([place])   . Postherpetic neuralgia   . Proteinuria   . Stroke ([place]) 01/2017  . Type II or unspecified type diabetes mellitus with renal manifestations, uncontrolled(250.42)   . Unspecified disorder of kidney  and ureter     Past Surgical History:  Procedure Laterality Date  . ABDOMINAL AORTOGRAM W/LOWER EXTREMITY N/A 10/31/2019   Procedure: ABDOMINAL AORTOGRAM W/LOWER EXTREMITY;  Surgeon: Wellington Hampshire, MD;  Location: [facility];  Service: Cardiovascular;  Laterality: N/A;  . ABDOMINAL AORTOGRAM W/LOWER EXTREMITY Left 05/12/2020   Procedure: ABDOMINAL AORTOGRAM W/LOWER EXTREMITY;  Surgeon: Waynetta Sandy, MD;  Location: [facility];  Service: Cardiovascular;  Laterality: Left;  . AMPUTATION Right 11/02/2019   Procedure: AMPUTATION BELOW KNEE RIGHT;  Surgeon: Rosetta Posner, MD;  Location: [facility];  Service: Vascular;  Laterality: Right;  . COLONOSCOPY     x 2  . ENDARTERECTOMY FEMORAL Left 05/21/2020   Procedure: LEFT COMMON FEMORAL ENDARTERECTOMY;  Surgeon: Rosetta Posner, MD;  Location: [facility];  Service: Vascular;  Laterality: Left;  . FEMORAL-POPLITEAL BYPASS GRAFT Left 05/21/2020   Procedure: LEFT FEMORAL TO BELOW KNEE POPLITEAL ARTERY BYPASS GRAFT;  Surgeon: Rosetta Posner, MD;  Location: [facility];  Service: Vascular;  Laterality: Left;  . hysterectomy    . INCISION AND DRAINAGE Left 05/27/14   sebacous cyst, ear  . PRP Left    Dr. Ricki Miller  . removal of cyst from hand    . removal of tumor from foot    . TONSILLECTOMY      There were  no vitals filed for this visit.   Subjective Assessment - 08/01/20 1404    Subjective Saw the prosthetist on Tuesday. Got the smaller foot on the prosthesis, which feels much ligther now. Also had the pin switched on the other liner to the longer one. No longer has any issues getting prosthesis to don. No falls or pain to report.    Limitations Lifting;Standing;Walking;House hold activities    Patient Stated Goals To use prosthesis to walk without device, take care of myself, live alone. Go out in community.    Currently in Pain? No/denies    Pain Score 0-No pain                   [facility] Adult PT Treatment/Exercise - 08/01/20  1406      Transfers   Transfers Sit to Stand;Stand to Sit    Sit to Stand 5: Supervision;With upper extremity assist;From chair/3-in-1    Stand to Sit 5: Supervision;With upper extremity assist;To chair/3-in-1      Ambulation/Gait   Ambulation/Gait Yes    Ambulation/Gait Assistance 4: Min guard    Ambulation/Gait Assistance Details use of rollator to enter/exit gym Mod I. use of cane with rubber quad tip in session. pt with improved posture and sequencing with cane today vs last session. Min guard assist for safety.    Ambulation Distance (Feet) 120 Feet   x1, 220 x1, plus around gym with session   Assistive device Straight cane;Prosthesis    Gait Pattern Step-through pattern;Decreased arm swing - left;Narrow base of support    Ambulation Surface Level;Indoor    Gait Comments pt reported her son was bringing her a cane with the "feet" on it. disucssed that a small base quad cane would most likely be too much for her, possiblty a tripping hazzard. Pt given ordering information for the rubber quad tip.      High Level Balance   High Level Balance Activities Side stepping;Backward walking    High Level Balance Comments with cane/prosthesis next to countertop- 3-4 laps each with min guard assist, cues on form/posture and weight shifting.       Prosthetics   Current prosthetic wear tolerance (days/week)  daily    Current prosthetic wear tolerance (#hours/day)  reports wearing it most awake hours. Reports wearing it more since having the foot changed on Tuesday.     Residual limb condition  intact per pt report    Donning Prosthesis Supervision    Doffing Prosthesis Modified independent (device/increased time)               Balance Exercises - 08/01/20 1434      Balance Exercises: Standing   Standing Eyes Closed Foam/compliant surface;Wide (BOA);Head turns;30 secs;Limitations;Narrow base of support (BOS);Other reps (comment)    Standing Eyes Closed Limitations on airex with no UE  support: feet hip width apart for EC no head movements, min guard assist for balance, progressed to feet wider apart for EC head movements left<>right, up<>down with up to min assist for balance.                PT Short Term Goals - 07/22/20 1324      PT SHORT TERM GOAL #1   Title Patient verbalizes how to manage limb pain / discomfort including adjusting ply socks with limb volume changes with prosthesis. (All STGs Target Date: 07/18/2020)    Baseline 07/22/20: pt. verbalized independence with adjusting ply socks with pain/volume changes.    Time 1  Period Months    Status Achieved    Target Date 07/18/20      PT SHORT TERM GOAL #2   Title Patient tolerates wear of prosthesis >12 hrs total / day & verbalizes adjusting assistive device with limb pain </= 2/10 including outside of PT.    Baseline 07/22/20: pt. reports she wears her prosthesis > 12 hrs/day, but her limb pain can get up to a 5/10 intermittently depending on the day.    Time 1    Period Months    Status Partially Met    Target Date 08/18/20      PT SHORT TERM GOAL #3   Title Berg Balance >36/56    Baseline 07/22/20: 37/56    Time 1    Period Months    Status Achieved    Target Date 07/18/20      PT SHORT TERM GOAL #4   Title Patient ambulates 200' with cane & prosthesis with supervision.    Baseline 07/22/20: pt. able to ambulate 250 ft. with cane & prosthesis but with min. assist by SPTA secondary to occasionaly LOB.    Time 1    Period Months    Status Partially Met    Target Date 08/18/20      PT SHORT TERM GOAL #5   Title Patient negotiates stairs with single rail, ramps & curbs with cane & prosthesis with minA.    Baseline 07/22/20: pt. able to negotiate stairs, ramps & curbs with cane & prosthesis with minA.    Time 1    Period Months    Status Achieved    Target Date 07/18/20             PT Long Term Goals - 06/19/20 0815      PT LONG TERM GOAL #1   Title Patient verbalizes & demonstrates  understanding of prosthetic care including modifying assistive device to manage limb pain & diabetic foot care to enable safe utilization. (All LTGs Target Date: 09/12/2020)    Time 12    Period Weeks    Status Revised    Target Date 09/12/20      PT LONG TERM GOAL #2   Title Patient tolerates prosthesis wear >90% of awake hours & verbalizes appropriate assistive device for environment without skin or residual limb pain to enable function throughout her day.    Time 12    Period Weeks    Status Revised    Target Date 09/12/20      PT LONG TERM GOAL #3   Title Berg Balance >/= 45/56 to indicate lower fall risk.    Time 12    Period Weeks    Status On-going    Target Date 09/12/20      PT LONG TERM GOAL #4   Title Patient ambulates 500' outdoors including grass with cane or less & prosthesis modified independent to enable community mobility.    Time 12    Period Weeks    Status On-going    Target Date 09/12/20      PT LONG TERM GOAL #5   Title Patient negotiates ramps, curbs & stairs with single rail with cane or less & prosthesis modified independent to enable community access.    Time 12    Period Weeks    Status On-going    Target Date 09/12/20      PT LONG TERM GOAL #6   Title Patient demonstrates skills to enable her to return to gardening including kneeling  with UE support like garden kneel bench or chair.    Time 12    Period Weeks    Status On-going    Target Date 09/12/20      PT LONG TERM GOAL #7   Title Patient ambulates 100' with prosthesis only carrying household items in [place] negotiating furniture modified independent.    Time 12    Period Weeks    Status New    Target Date 09/12/20                 Plan - 08/01/20 1406    Clinical Impression Statement Today's skilled session continued to address gait/balance activities with cane/prosthesis with less overall assistance needed today vs previous sessions. Also continued to work on balance reactions on  compliant surfaces without UE support with less assistance needed. The pt is progressing toward goals and should benefit from continued PT to progress toward unmet goals.    Personal Factors and Comorbidities Age;Comorbidity 3+;Fitness;Time since onset of injury/illness/exacerbation;Transportation    Comorbidities Right TTA, HTN, DM, CVA, PAD, HLD, CKD st2,    Examination-Activity Limitations Locomotion Level;Stairs;Stand;Transfers;Toileting    Examination-Participation Restrictions Community Activity;Yard Work;Shop    Stability/Clinical Decision Making Evolving/Moderate complexity    Rehab Potential Good    PT Frequency 2x / week    PT Duration 12 weeks    PT Treatment/Interventions ADLs/Self Care Home Management;DME Instruction;Gait training;Stair training;Functional mobility training;Therapeutic activities;Therapeutic exercise;Balance training;Neuromuscular re-education;Patient/family education;Prosthetic Training;Vestibular;Manual techniques    PT Next Visit Plan Continue with prosthetic training with SPC and weight shifting activities to the R. Work on [employer] as well as Physicist, medical with Plan of Care Patient           Patient will benefit from skilled therapeutic intervention in order to improve the following deficits and impairments:  Abnormal gait, Decreased activity tolerance, Decreased balance, Decreased endurance, Decreased knowledge of use of DME, Decreased mobility, Decreased skin integrity, Decreased strength, Postural dysfunction, Prosthetic Dependency, Pain  Visit Diagnosis: Other abnormalities of gait and mobility  Muscle weakness (generalized)  Unsteadiness on feet  Weakness generalized     Problem List Patient Active Problem List   Diagnosis Date Noted  . PAD (peripheral artery disease) ([place]) 05/21/2020  . Acute metabolic encephalopathy 44/02/4741  . COVID-19 virus infection 02/08/2020  . Acute kidney injury superimposed  on CKD ([place]) 02/08/2020  . Status post below-knee amputation of right lower extremity ([place]) 11/07/2019  . Gangrene of right foot ([place])   . Hyponatremia 10/29/2019  . Chronic diastolic CHF (congestive heart failure) ([place]) 10/29/2019  . Stable proliferative diabetic retinopathy of both eyes associated with type 2 diabetes mellitus ([place]) 06/05/2018  . Acute CVA (cerebrovascular accident) ([place]) 02/18/2017  . Acute ischemic stroke ([place])   . Internuclear ophthalmoplegia of left eye   . Benign paroxysmal positional vertigo   . Abnormality of gait   . Acute onset of severe vertigo 02/17/2017  . Vertigo 02/17/2017  . Atherosclerosis of native artery of extremity with intermittent claudication ([place]) 03/11/2014  . Diabetic retinopathy ([place]) 03/11/2014  . Retinal hemorrhage due to secondary diabetes ([place]) 03/11/2014  . Type II diabetes mellitus with renal manifestations, uncontrolled ([place]) 03/11/2014  . Chronic hepatitis C without hepatic coma ([place]) 03/11/2014  . Hyperlipidemia 03/11/2014  . Hypoglycemia 04/16/2013  . Disorder of bone and cartilage, unspecified   . Other and unspecified hyperlipidemia   . Essential hypertension, benign   . Atherosclerosis of native artery of extremity ([place])   .  Chronic kidney disease (CKD), stage II (mild)   . Peripheral arterial disease ([place])   . Anemia   . Postherpetic neuralgia   . Hypertension     [place], [place], [place] [address] [place],  [address] [phone] 08/01/20, 5:21 PM   Name: Monique Cabrera MRN: [id number] Date of Birth: 14-Jun-1935

## 2020-08-03 DIAGNOSIS — I639 Cerebral infarction, unspecified: Secondary | ICD-10-CM | POA: Diagnosis not present

## 2020-08-03 DIAGNOSIS — I96 Gangrene, not elsewhere classified: Secondary | ICD-10-CM | POA: Diagnosis not present

## 2020-08-05 DIAGNOSIS — E1122 Type 2 diabetes mellitus with diabetic chronic kidney disease: Secondary | ICD-10-CM | POA: Diagnosis not present

## 2020-08-05 DIAGNOSIS — R809 Proteinuria, unspecified: Secondary | ICD-10-CM | POA: Diagnosis not present

## 2020-08-05 DIAGNOSIS — I129 Hypertensive chronic kidney disease with stage 1 through stage 4 chronic kidney disease, or unspecified chronic kidney disease: Secondary | ICD-10-CM | POA: Diagnosis not present

## 2020-08-05 DIAGNOSIS — D649 Anemia, unspecified: Secondary | ICD-10-CM | POA: Diagnosis not present

## 2020-08-05 DIAGNOSIS — N1832 Chronic kidney disease, stage 3b: Secondary | ICD-10-CM | POA: Diagnosis not present

## 2020-08-06 ENCOUNTER — Ambulatory Visit: Payer: Medicare Other | Admitting: Physical Therapy

## 2020-08-06 ENCOUNTER — Other Ambulatory Visit: Payer: Self-pay

## 2020-08-06 ENCOUNTER — Encounter: Payer: Self-pay | Admitting: Physical Therapy

## 2020-08-06 DIAGNOSIS — R531 Weakness: Secondary | ICD-10-CM

## 2020-08-06 DIAGNOSIS — R293 Abnormal posture: Secondary | ICD-10-CM | POA: Diagnosis not present

## 2020-08-06 DIAGNOSIS — M6281 Muscle weakness (generalized): Secondary | ICD-10-CM | POA: Diagnosis not present

## 2020-08-06 DIAGNOSIS — R2689 Other abnormalities of gait and mobility: Secondary | ICD-10-CM

## 2020-08-06 DIAGNOSIS — R2681 Unsteadiness on feet: Secondary | ICD-10-CM | POA: Diagnosis not present

## 2020-08-08 ENCOUNTER — Other Ambulatory Visit: Payer: Self-pay

## 2020-08-08 ENCOUNTER — Encounter: Payer: Self-pay | Admitting: Physical Therapy

## 2020-08-08 ENCOUNTER — Ambulatory Visit: Payer: Medicare Other | Admitting: Physical Therapy

## 2020-08-08 ENCOUNTER — Telehealth: Payer: Self-pay | Admitting: Internal Medicine

## 2020-08-08 DIAGNOSIS — R293 Abnormal posture: Secondary | ICD-10-CM | POA: Diagnosis not present

## 2020-08-08 DIAGNOSIS — R531 Weakness: Secondary | ICD-10-CM | POA: Diagnosis not present

## 2020-08-08 DIAGNOSIS — M6281 Muscle weakness (generalized): Secondary | ICD-10-CM | POA: Diagnosis not present

## 2020-08-08 DIAGNOSIS — R2681 Unsteadiness on feet: Secondary | ICD-10-CM

## 2020-08-08 DIAGNOSIS — R2689 Other abnormalities of gait and mobility: Secondary | ICD-10-CM | POA: Diagnosis not present

## 2020-08-08 DIAGNOSIS — N390 Urinary tract infection, site not specified: Secondary | ICD-10-CM | POA: Diagnosis not present

## 2020-08-08 NOTE — Therapy (Addendum)
[facility] [address] [place], [place], [address] Phone: [phone]   Fax:  [phone]  Physical Therapy Treatment/10th visit progress note  Patient Details  Name: Monique Cabrera MRN: [id number] Date of Birth: [birth year] Referring Provider (PT): Curt Jews, MD   Encounter Date: 08/08/2020   PT End of Session - 08/08/20 1412    Visit Number 30    Number of Visits 44    Date for PT Re-Evaluation 09/12/20    Authorization Type [employer] Medicare & Medicaid    Progress Note Due on Visit 40    PT Start Time 1405    PT Stop Time 1445    PT Time Calculation (min) 40 min    Equipment Utilized During Treatment Gait belt    Activity Tolerance Patient tolerated treatment well;No increased pain    Behavior During Therapy WFL for tasks assessed/performed           Past Medical History:  Diagnosis Date  . Acute upper respiratory infections of unspecified site   . Amputee 08/2019  . Anemia   . Anemia, unspecified   . Arthritis   . Atherosclerosis of native arteries of the extremities, unspecified   . Chest pain, unspecified   . Chronic kidney disease (CKD), stage II (mild)    [facility]  . Diabetes mellitus without complication ([place])    Type II  . Diarrhea   . Disorder of bone and cartilage, unspecified   . Herpes zoster with other nervous system complications(053.19)   . History of blood transfusion   . Hypercalcemia   . Hypertension   . Hypertensive renal disease, benign   . Nonspecific reaction to tuberculin skin test without active tuberculosis(795.51)   . Other and unspecified hyperlipidemia   . PAD (peripheral artery disease) ([place])    Per records from [name]  . Pain in joint, lower leg   . Peripheral arterial disease ([place])   . Postherpetic neuralgia   . Proteinuria   . Stroke ([place]) 01/2017  . Type II or unspecified type diabetes mellitus with renal manifestations, uncontrolled(250.42)   .  Unspecified disorder of kidney and ureter     Past Surgical History:  Procedure Laterality Date  . ABDOMINAL AORTOGRAM W/LOWER EXTREMITY N/A 10/31/2019   Procedure: ABDOMINAL AORTOGRAM W/LOWER EXTREMITY;  Surgeon: Wellington Hampshire, MD;  Location: [facility];  Service: Cardiovascular;  Laterality: N/A;  . ABDOMINAL AORTOGRAM W/LOWER EXTREMITY Left 05/12/2020   Procedure: ABDOMINAL AORTOGRAM W/LOWER EXTREMITY;  Surgeon: Waynetta Sandy, MD;  Location: [facility];  Service: Cardiovascular;  Laterality: Left;  . AMPUTATION Right 11/02/2019   Procedure: AMPUTATION BELOW KNEE RIGHT;  Surgeon: Rosetta Posner, MD;  Location: [facility];  Service: Vascular;  Laterality: Right;  . COLONOSCOPY     x 2  . ENDARTERECTOMY FEMORAL Left 05/21/2020   Procedure: LEFT COMMON FEMORAL ENDARTERECTOMY;  Surgeon: Rosetta Posner, MD;  Location: [facility];  Service: Vascular;  Laterality: Left;  . FEMORAL-POPLITEAL BYPASS GRAFT Left 05/21/2020   Procedure: LEFT FEMORAL TO BELOW KNEE POPLITEAL ARTERY BYPASS GRAFT;  Surgeon: Rosetta Posner, MD;  Location: [facility];  Service: Vascular;  Laterality: Left;  . hysterectomy    . INCISION AND DRAINAGE Left 05/27/14   sebacous cyst, ear  . PRP Left    Dr. Ricki Miller  . removal of cyst from hand    . removal of tumor from foot    . TONSILLECTOMY  There were no vitals filed for this visit.   Subjective Assessment - 08/08/20 1409    Subjective No new complaints. When she got home on Wed she was talking with her daughter about the canes. The one her son brought over is a quad cane, which she put away in the closet. She then looked at the cane she got from the hospital a while ago. Turns out it is black, not silver which she does not like. She just needs the quad tip. Her daughter is planning to order this for her today off of [employer].    Limitations Lifting;Standing;Walking;House hold activities    Patient Stated Goals To use prosthesis to walk without device, take  care of myself, live alone. Go out in community.    Currently in Pain? No/denies    Pain Score 0-No pain                  [facility] Adult PT Treatment/Exercise - 08/08/20 1413      Transfers   Transfers Sit to Stand;Stand to Sit    Sit to Stand 5: Supervision;With upper extremity assist;From chair/3-in-1    Stand to Sit 5: Supervision;With upper extremity assist;To chair/3-in-1      Ambulation/Gait   Ambulation/Gait Yes    Ambulation/Gait Assistance 4: Min guard;4: Min assist    Ambulation/Gait Assistance Details min guard assist with gait indoors while carrying on conversation and scanning gym, occasional obstacle to negotiate around; up to min assist for gait outdoors with cues for prosthetic foot clearance due to toe scuffing several times with 2 of them needing assist to correct balance.    Ambulation Distance (Feet) 230 Feet   x1, x1 in/outdoors   Assistive device Straight cane;Prosthesis   with rubber quad tip   Gait Pattern Step-through pattern;Decreased arm swing - left;Narrow base of support    Ambulation Surface Level;Indoor    Ramp 4: Min assist   min guard to min assist   Ramp Details (indicate cue type and reason) with cane/prosthesis for 3 reps with cues on sequencing and technique. cues to increase base of support with ramp negotiation as well.       High Level Balance   High Level Balance Activities Figure 8 turns    High Level Balance Comments with cane/prosthesis pt performed 2 laps with up to min assist, cues on posture, base of support and step length; had pt rest while PTA demo'd correct way to step to maintain pelvic positioning forward with improved carryover, less cues/assist needed on pt's next lap with cane/prosthesis.       Prosthetics   Current prosthetic wear tolerance (days/week)  daily    Current prosthetic wear tolerance (#hours/day)  reports wearing it most awake hours. Reports wearing it more since having the foot changed on Tuesday.     Residual  limb condition  intact per pt report    Donning Prosthesis Supervision    Doffing Prosthesis Modified independent (device/increased time)                PT Short Term Goals - 07/22/20 1324      PT SHORT TERM GOAL #1   Title Patient verbalizes how to manage limb pain / discomfort including adjusting ply socks with limb volume changes with prosthesis. (All STGs Target Date: 07/18/2020)    Baseline 07/22/20: pt. verbalized independence with adjusting ply socks with pain/volume changes.    Time 1    Period Months    Status Achieved  Target Date 07/18/20      PT SHORT TERM GOAL #2   Title Patient tolerates wear of prosthesis >12 hrs total / day & verbalizes adjusting assistive device with limb pain </= 2/10 including outside of PT.    Baseline 07/22/20: pt. reports she wears her prosthesis > 12 hrs/day, but her limb pain can get up to a 5/10 intermittently depending on the day.    Time 1    Period Months    Status Partially Met    Target Date 08/18/20      PT SHORT TERM GOAL #3   Title Berg Balance >36/56    Baseline 07/22/20: 37/56    Time 1    Period Months    Status Achieved    Target Date 07/18/20      PT SHORT TERM GOAL #4   Title Patient ambulates 200' with cane & prosthesis with supervision.    Baseline 07/22/20: pt. able to ambulate 250 ft. with cane & prosthesis but with min. assist by SPTA secondary to occasionaly LOB.    Time 1    Period Months    Status Partially Met    Target Date 08/18/20      PT SHORT TERM GOAL #5   Title Patient negotiates stairs with single rail, ramps & curbs with cane & prosthesis with minA.    Baseline 07/22/20: pt. able to negotiate stairs, ramps & curbs with cane & prosthesis with minA.    Time 1    Period Months    Status Achieved    Target Date 07/18/20             PT Long Term Goals - 06/19/20 0815      PT LONG TERM GOAL #1   Title Patient verbalizes & demonstrates understanding of prosthetic care including modifying  assistive device to manage limb pain & diabetic foot care to enable safe utilization. (All LTGs Target Date: 09/12/2020)    Time 12    Period Weeks    Status Revised    Target Date 09/12/20      PT LONG TERM GOAL #2   Title Patient tolerates prosthesis wear >90% of awake hours & verbalizes appropriate assistive device for environment without skin or residual limb pain to enable function throughout her day.    Time 12    Period Weeks    Status Revised    Target Date 09/12/20      PT LONG TERM GOAL #3   Title Berg Balance >/= 45/56 to indicate lower fall risk.    Time 12    Period Weeks    Status On-going    Target Date 09/12/20      PT LONG TERM GOAL #4   Title Patient ambulates 500' outdoors including grass with cane or less & prosthesis modified independent to enable community mobility.    Time 12    Period Weeks    Status On-going    Target Date 09/12/20      PT LONG TERM GOAL #5   Title Patient negotiates ramps, curbs & stairs with single rail with cane or less & prosthesis modified independent to enable community access.    Time 12    Period Weeks    Status On-going    Target Date 09/12/20      PT LONG TERM GOAL #6   Title Patient demonstrates skills to enable her to return to gardening including kneeling with UE support like garden kneel bench or chair.  Time 12    Period Weeks    Status On-going    Target Date 09/12/20      PT LONG TERM GOAL #7   Title Patient ambulates 100' with prosthesis only carrying household items in [place] negotiating furniture modified independent.    Time 12    Period Weeks    Status New    Target Date 09/12/20                 Plan - 08/08/20 1413    Clinical Impression Statement Today's skilled session continued to focus on use of straight cane with gait and balance activities. Pt at a min guard level for balance on indoor level surfaces and up to min assist for balance on outdoor paved surfaces. Continued to work on ramps with  decreased assistance needed as reps progressed. The pt does continue to have increased lateral lean at times with dynamic activities and sharp turns needing up to min assist. The pt is progressing toward goals and should benefit from continued PT to progress toward unmet goals.    Personal Factors and Comorbidities Age;Comorbidity 3+;Fitness;Time since onset of injury/illness/exacerbation;Transportation    Comorbidities Right TTA, HTN, DM, CVA, PAD, HLD, CKD st2,    Examination-Activity Limitations Locomotion Level;Stairs;Stand;Transfers;Toileting    Examination-Participation Restrictions Community Activity;Yard Work;Shop    Stability/Clinical Decision Making Evolving/Moderate complexity    Rehab Potential Good    PT Frequency 2x / week    PT Duration 12 weeks    PT Treatment/Interventions ADLs/Self Care Home Management;DME Instruction;Gait training;Stair training;Functional mobility training;Therapeutic activities;Therapeutic exercise;Balance training;Neuromuscular re-education;Patient/family education;Prosthetic Training;Vestibular;Manual techniques    PT Next Visit Plan Continue with prosthetic training with SPC and weight shifting activities to the R. Work on [employer] as well as Physicist, medical with Plan of Care Patient           Patient will benefit from skilled therapeutic intervention in order to improve the following deficits and impairments:  Abnormal gait, Decreased activity tolerance, Decreased balance, Decreased endurance, Decreased knowledge of use of DME, Decreased mobility, Decreased skin integrity, Decreased strength, Postural dysfunction, Prosthetic Dependency, Pain  Visit Diagnosis: Other abnormalities of gait and mobility  Muscle weakness (generalized)  Unsteadiness on feet  Weakness generalized     Problem List Patient Active Problem List   Diagnosis Date Noted  . PAD (peripheral artery disease) ([place]) 05/21/2020  . Acute  metabolic encephalopathy 79/89/2119  . COVID-19 virus infection 02/08/2020  . Acute kidney injury superimposed on CKD ([place]) 02/08/2020  . Status post below-knee amputation of right lower extremity ([place]) 11/07/2019  . Gangrene of right foot ([place])   . Hyponatremia 10/29/2019  . Chronic diastolic CHF (congestive heart failure) ([place]) 10/29/2019  . Stable proliferative diabetic retinopathy of both eyes associated with type 2 diabetes mellitus ([place]) 06/05/2018  . Acute CVA (cerebrovascular accident) ([place]) 02/18/2017  . Acute ischemic stroke ([place])   . Internuclear ophthalmoplegia of left eye   . Benign paroxysmal positional vertigo   . Abnormality of gait   . Acute onset of severe vertigo 02/17/2017  . Vertigo 02/17/2017  . Atherosclerosis of native artery of extremity with intermittent claudication ([place]) 03/11/2014  . Diabetic retinopathy ([place]) 03/11/2014  . Retinal hemorrhage due to secondary diabetes ([place]) 03/11/2014  . Type II diabetes mellitus with renal manifestations, uncontrolled ([place]) 03/11/2014  . Chronic hepatitis C without hepatic coma ([place]) 03/11/2014  . Hyperlipidemia 03/11/2014  . Hypoglycemia 04/16/2013  . Disorder of bone and cartilage,  unspecified   . Other and unspecified hyperlipidemia   . Essential hypertension, benign   . Atherosclerosis of native artery of extremity ([place])   . Chronic kidney disease (CKD), stage II (mild)   . Peripheral arterial disease ([place])   . Anemia   . Postherpetic neuralgia   . Hypertension     Progress Note  Reporting period 06/26/20 to 08/08/20  See Note above for Objective Data and Assessment of Progress/Goals   Willow Ora, PTA, [facility] [address] [place], [place] [address] [phone] 08/08/20, 3:22 PM   Cherly Anderson, PT, DPT, NCS   Name: Monique Cabrera MRN: [id number] Date of Birth: 1935/12/26

## 2020-08-08 NOTE — Therapy (Signed)
[facility] [address] [place], [place], [address] Phone: [phone]   Fax:  [phone]  Physical Therapy Treatment  Patient Details  Name: GLYNN FREAS MRN: [id number] Date of Birth: 1935-11-13 Referring Provider (PT): Curt Jews, MD   Encounter Date: 08/06/2020     08/06/20 1408  PT Visits / Re-Eval  Visit Number 29  Number of Visits 44  Date for PT Re-Evaluation 09/12/20  Authorization  Authorization Type [employer] Medicare & Medicaid  Progress Note Due on Visit 30  PT Time Calculation  PT Start Time 1403  PT Stop Time 1445  PT Time Calculation (min) 42 min  PT - End of Session  Equipment Utilized During Treatment Gait belt  Activity Tolerance Patient tolerated treatment well;No increased pain  Behavior During Therapy WFL for tasks assessed/performed    Past Medical History:  Diagnosis Date  . Acute upper respiratory infections of unspecified site   . Amputee 08/2019  . Anemia   . Anemia, unspecified   . Arthritis   . Atherosclerosis of native arteries of the extremities, unspecified   . Chest pain, unspecified   . Chronic kidney disease (CKD), stage II (mild)    [facility]  . Diabetes mellitus without complication ([place])    Type II  . Diarrhea   . Disorder of bone and cartilage, unspecified   . Herpes zoster with other nervous system complications(053.19)   . History of blood transfusion   . Hypercalcemia   . Hypertension   . Hypertensive renal disease, benign   . Nonspecific reaction to tuberculin skin test without active tuberculosis(795.51)   . Other and unspecified hyperlipidemia   . PAD (peripheral artery disease) ([place])    Per records from [facility]  . Pain in joint, lower leg   . Peripheral arterial disease ([place])   . Postherpetic neuralgia   . Proteinuria   . Stroke ([place]) 01/2017  . Type II or unspecified type diabetes mellitus with renal manifestations, uncontrolled(250.42)    . Unspecified disorder of kidney and ureter     Past Surgical History:  Procedure Laterality Date  . ABDOMINAL AORTOGRAM W/LOWER EXTREMITY N/A 10/31/2019   Procedure: ABDOMINAL AORTOGRAM W/LOWER EXTREMITY;  Surgeon: Wellington Hampshire, MD;  Location: [facility];  Service: Cardiovascular;  Laterality: N/A;  . ABDOMINAL AORTOGRAM W/LOWER EXTREMITY Left 05/12/2020   Procedure: ABDOMINAL AORTOGRAM W/LOWER EXTREMITY;  Surgeon: Waynetta Sandy, MD;  Location: [facility];  Service: Cardiovascular;  Laterality: Left;  . AMPUTATION Right 11/02/2019   Procedure: AMPUTATION BELOW KNEE RIGHT;  Surgeon: Rosetta Posner, MD;  Location: [facility];  Service: Vascular;  Laterality: Right;  . COLONOSCOPY     x 2  . ENDARTERECTOMY FEMORAL Left 05/21/2020   Procedure: LEFT COMMON FEMORAL ENDARTERECTOMY;  Surgeon: Rosetta Posner, MD;  Location: [facility];  Service: Vascular;  Laterality: Left;  . FEMORAL-POPLITEAL BYPASS GRAFT Left 05/21/2020   Procedure: LEFT FEMORAL TO BELOW KNEE POPLITEAL ARTERY BYPASS GRAFT;  Surgeon: Rosetta Posner, MD;  Location: [facility];  Service: Vascular;  Laterality: Left;  . hysterectomy    . INCISION AND DRAINAGE Left 05/27/14   sebacous cyst, ear  . PRP Left    Dr. Ricki Miller  . removal of cyst from hand    . removal of tumor from foot    . TONSILLECTOMY      There were no vitals filed for this visit.     08/06/20 1406  Symptoms/Limitations  Subjective No new complaints. Son sent her a small base quad cane which she has been using.  Limitations Lifting;Standing;Walking;House hold activities  Patient Stated Goals To use prosthesis to walk without device, take care of myself, live alone. Go out in community.  Pain Assessment  Currently in Pain? No/denies  Pain Score 0      08/06/20 1409  Transfers  Transfers Sit to Stand;Stand to Sit  Sit to Stand 5: Supervision;With upper extremity assist;From chair/3-in-1  Stand to Sit 5: Supervision;With upper extremity  assist;To chair/3-in-1  Ambulation/Gait  Ambulation/Gait Yes  Ambulation/Gait Assistance 4: Min guard;4: Min assist  Ambulation/Gait Assistance Details trial of two styles of cane. pt given information on where to find more "feminine" style cane/tip after trial of small base quad cane vs straight cane with rubber tip in session today. Pt needing increased assistance with quad cane for balance.   Ambulation Distance (Feet) 120 Feet (x1 with SBQC; 230 x1 with straight cane)  Assistive device Straight cane;Prosthesis;Small based quad cane (straight cane with rubber quad tip)  Gait Pattern Step-through pattern;Decreased arm swing - left;Narrow base of support  Ambulation Surface Level;Indoor  Prosthetics  Prosthetic Care Comments  pt with reports of not being able to get the same number of socks on she used to. Worried her leg is swelling up. Upon inspection of her socked was noted pt now has pre tibial and posterior pads. These were placed when she went to prosthetist last week. Discussed/education on the purpose of the pads to take up space and decrease the number of socks. This lead into a discussion on socket revisions and that they usually occur around 6 months after getting the 1st prosthesis.   Current prosthetic wear tolerance (days/week)  daily  Current prosthetic wear tolerance (#hours/day)  reports wearing it most awake hours. Reports wearing it more since having the foot changed on Tuesday.   Residual limb condition  intact per pt report  Education Provided Residual limb care;Correct ply sock adjustment  Person(s) Educated Patient  Education Method Explanation;Demonstration;Verbal cues  Education Method Verbalized understanding;Returned demonstration  Donning Prosthesis 5  Doffing Prosthesis 6      08/08/20 0043  Balance Exercises: Standing  Standing Eyes Closed Foam/compliant surface;Wide (BOA);Head turns;30 secs;Limitations;Narrow base of support (BOS);Other reps (comment)   Standing Eyes Closed Limitations on airex with no UE support: feet hip width apart for EC no head movements, min guard assist for balance, progressed to feet wider apart for EC head movements left<>right, up<>down with up to min assist for balance.          PT Short Term Goals - 07/22/20 1324      PT SHORT TERM GOAL #1   Title Patient verbalizes how to manage limb pain / discomfort including adjusting ply socks with limb volume changes with prosthesis. (All STGs Target Date: 07/18/2020)    Baseline 07/22/20: pt. verbalized independence with adjusting ply socks with pain/volume changes.    Time 1    Period Months    Status Achieved    Target Date 07/18/20      PT SHORT TERM GOAL #2   Title Patient tolerates wear of prosthesis >12 hrs total / day & verbalizes adjusting assistive device with limb pain </= 2/10 including outside of PT.    Baseline 07/22/20: pt. reports she wears her prosthesis > 12 hrs/day, but her limb pain can get up to a 5/10 intermittently depending on the day.    Time 1    Period Months  Status Partially Met    Target Date 08/18/20      PT SHORT TERM GOAL #3   Title Berg Balance >36/56    Baseline 07/22/20: 37/56    Time 1    Period Months    Status Achieved    Target Date 07/18/20      PT SHORT TERM GOAL #4   Title Patient ambulates 200' with cane & prosthesis with supervision.    Baseline 07/22/20: pt. able to ambulate 250 ft. with cane & prosthesis but with min. assist by SPTA secondary to occasionaly LOB.    Time 1    Period Months    Status Partially Met    Target Date 08/18/20      PT SHORT TERM GOAL #5   Title Patient negotiates stairs with single rail, ramps & curbs with cane & prosthesis with minA.    Baseline 07/22/20: pt. able to negotiate stairs, ramps & curbs with cane & prosthesis with minA.    Time 1    Period Months    Status Achieved    Target Date 07/18/20             PT Long Term Goals - 06/19/20 0815      PT LONG TERM GOAL #1    Title Patient verbalizes & demonstrates understanding of prosthetic care including modifying assistive device to manage limb pain & diabetic foot care to enable safe utilization. (All LTGs Target Date: 09/12/2020)    Time 12    Period Weeks    Status Revised    Target Date 09/12/20      PT LONG TERM GOAL #2   Title Patient tolerates prosthesis wear >90% of awake hours & verbalizes appropriate assistive device for environment without skin or residual limb pain to enable function throughout her day.    Time 12    Period Weeks    Status Revised    Target Date 09/12/20      PT LONG TERM GOAL #3   Title Berg Balance >/= 45/56 to indicate lower fall risk.    Time 12    Period Weeks    Status On-going    Target Date 09/12/20      PT LONG TERM GOAL #4   Title Patient ambulates 500' outdoors including grass with cane or less & prosthesis modified independent to enable community mobility.    Time 12    Period Weeks    Status On-going    Target Date 09/12/20      PT LONG TERM GOAL #5   Title Patient negotiates ramps, curbs & stairs with single rail with cane or less & prosthesis modified independent to enable community access.    Time 12    Period Weeks    Status On-going    Target Date 09/12/20      PT LONG TERM GOAL #6   Title Patient demonstrates skills to enable her to return to gardening including kneeling with UE support like garden kneel bench or chair.    Time 12    Period Weeks    Status On-going    Target Date 09/12/20      PT LONG TERM GOAL #7   Title Patient ambulates 100' with prosthesis only carrying household items in [place] negotiating furniture modified independent.    Time 12    Period Weeks    Status New    Target Date 09/12/20             08/06/20  [place] Today's skilled session continued to focus on gait with various canes with pt preferring the straight cane with rubber quad tip. Ordering infrormation provided for  getting the tip and a cane that the pt finds "more femine". Remainder of session focused on balance reactions with up to min assist needed. The pt is progressing toward goals and should benefit from continued PT to progress toward unmet goals.  Personal Factors and Comorbidities Age;Comorbidity 3+;Fitness;Time since onset of injury/illness/exacerbation;Transportation  Comorbidities Right TTA, HTN, DM, CVA, PAD, HLD, CKD st2,  Examination-Activity Limitations Locomotion Level;Stairs;Stand;Transfers;Toileting  Examination-Participation Restrictions Community Activity;Yard Work;Shop  Pt will benefit from skilled therapeutic intervention in order to improve on the following deficits Abnormal gait;Decreased activity tolerance;Decreased balance;Decreased endurance;Decreased knowledge of use of DME;Decreased mobility;Decreased skin integrity;Decreased strength;Postural dysfunction;Prosthetic Dependency;Pain  Stability/Clinical Decision Making Evolving/Moderate complexity  Rehab Potential Good  PT Frequency 2x / week  PT Duration 12 weeks  PT Treatment/Interventions ADLs/Self Care Home Management;DME Instruction;Gait training;Stair training;Functional mobility training;Therapeutic activities;Therapeutic exercise;Balance training;Neuromuscular re-education;Patient/family education;Prosthetic Training;Vestibular;Manual techniques  PT Next Visit Plan Continue with prosthetic training with SPC and weight shifting activities to the R. 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, structural with Plan of Care Patient          Patient will benefit from skilled therapeutic intervention in order to improve the following deficits and impairments:  Abnormal gait, Decreased activity tolerance, Decreased balance, Decreased endurance, Decreased knowledge of use of DME, Decreased mobility, Decreased skin integrity, Decreased strength, Postural dysfunction, Prosthetic Dependency, Pain  Visit  Diagnosis: Other abnormalities of gait and mobility  Muscle weakness (generalized)  Unsteadiness on feet  Weakness generalized     Problem List Patient Active Problem List   Diagnosis Date Noted  . PAD (peripheral artery disease) ([place]) 05/21/2020  . Acute metabolic encephalopathy 82/99/3716  . COVID-19 virus infection 02/08/2020  . Acute kidney injury superimposed on CKD ([place]) 02/08/2020  . Status post below-knee amputation of right lower extremity ([place]) 11/07/2019  . Gangrene of right foot ([place])   . Hyponatremia 10/29/2019  . Chronic diastolic CHF (congestive heart failure) ([place]) 10/29/2019  . Stable proliferative diabetic retinopathy of both eyes associated with type 2 diabetes mellitus ([place]) 06/05/2018  . Acute CVA (cerebrovascular accident) ([place]) 02/18/2017  . Acute ischemic stroke ([place])   . Internuclear ophthalmoplegia of left eye   . Benign paroxysmal positional vertigo   . Abnormality of gait   . Acute onset of severe vertigo 02/17/2017  . Vertigo 02/17/2017  . Atherosclerosis of native artery of extremity with intermittent claudication ([place]) 03/11/2014  . Diabetic retinopathy ([place]) 03/11/2014  . Retinal hemorrhage due to secondary diabetes ([place]) 03/11/2014  . Type II diabetes mellitus with renal manifestations, uncontrolled ([place]) 03/11/2014  . Chronic hepatitis C without hepatic coma ([place]) 03/11/2014  . Hyperlipidemia 03/11/2014  . Hypoglycemia 04/16/2013  . Disorder of bone and cartilage, unspecified   . Other and unspecified hyperlipidemia   . Essential hypertension, benign   . Atherosclerosis of native artery of extremity ([place])   . Chronic kidney disease (CKD), stage II (mild)   . Peripheral arterial disease ([place])   . Anemia   . Postherpetic neuralgia   . Hypertension     [place], [place], [place] [address] [place], [place] [address] [phone] 08/08/20, 12:40 AM   Name: SARYNA KNEELAND MRN: [id number] Date of  Birth: 04-16-1935

## 2020-08-12 ENCOUNTER — Encounter: Payer: Self-pay | Admitting: Internal Medicine

## 2020-08-13 ENCOUNTER — Encounter: Payer: Self-pay | Admitting: Rehabilitation

## 2020-08-13 ENCOUNTER — Ambulatory Visit: Payer: Medicare Other | Admitting: Rehabilitation

## 2020-08-13 ENCOUNTER — Other Ambulatory Visit: Payer: Self-pay

## 2020-08-13 DIAGNOSIS — R2689 Other abnormalities of gait and mobility: Secondary | ICD-10-CM

## 2020-08-13 DIAGNOSIS — R2681 Unsteadiness on feet: Secondary | ICD-10-CM

## 2020-08-13 DIAGNOSIS — M6281 Muscle weakness (generalized): Secondary | ICD-10-CM

## 2020-08-13 DIAGNOSIS — R293 Abnormal posture: Secondary | ICD-10-CM

## 2020-08-13 DIAGNOSIS — R531 Weakness: Secondary | ICD-10-CM

## 2020-08-13 NOTE — Therapy (Signed)
[facility] [address] [place], [place], [address] Phone: [phone]   Fax:  [phone]  Physical Therapy Treatment  Patient Details  Name: Monique Cabrera MRN: [id number] Date of Birth: 1935/08/12 Referring Provider (PT): Curt Jews, MD   Encounter Date: 08/13/2020   PT End of Session - 08/13/20 1506    Visit Number 31    Number of Visits 20    Date for PT Re-Evaluation 09/12/20    Authorization Type [employer] Medicare & Medicaid    Progress Note Due on Visit 40    PT Start Time 1405    PT Stop Time 1445    PT Time Calculation (min) 40 min    Activity Tolerance Patient tolerated treatment well;No increased pain    Behavior During Therapy WFL for tasks assessed/performed           Past Medical History:  Diagnosis Date  . Acute upper respiratory infections of unspecified site   . Amputee 08/2019  . Anemia   . Anemia, unspecified   . Arthritis   . Atherosclerosis of native arteries of the extremities, unspecified   . Chest pain, unspecified   . Chronic kidney disease (CKD), stage II (mild)    [facility]  . Diabetes mellitus without complication ([place])    Type II  . Diarrhea   . Disorder of bone and cartilage, unspecified   . Herpes zoster with other nervous system complications(053.19)   . History of blood transfusion   . Hypercalcemia   . Hypertension   . Hypertensive renal disease, benign   . Nonspecific reaction to tuberculin skin test without active tuberculosis(795.51)   . Other and unspecified hyperlipidemia   . PAD (peripheral artery disease) ([place])    Per records from [name]  . Pain in joint, lower leg   . Peripheral arterial disease ([place])   . Postherpetic neuralgia   . Proteinuria   . Stroke ([place]) 01/2017  . Type II or unspecified type diabetes mellitus with renal manifestations, uncontrolled(250.42)   . Unspecified disorder of kidney and ureter     Past Surgical History:   Procedure Laterality Date  . ABDOMINAL AORTOGRAM W/LOWER EXTREMITY N/A 10/31/2019   Procedure: ABDOMINAL AORTOGRAM W/LOWER EXTREMITY;  Surgeon: Wellington Hampshire, MD;  Location: [facility];  Service: Cardiovascular;  Laterality: N/A;  . ABDOMINAL AORTOGRAM W/LOWER EXTREMITY Left 05/12/2020   Procedure: ABDOMINAL AORTOGRAM W/LOWER EXTREMITY;  Surgeon: Waynetta Sandy, MD;  Location: [facility];  Service: Cardiovascular;  Laterality: Left;  . AMPUTATION Right 11/02/2019   Procedure: AMPUTATION BELOW KNEE RIGHT;  Surgeon: Rosetta Posner, MD;  Location: [facility];  Service: Vascular;  Laterality: Right;  . COLONOSCOPY     x 2  . ENDARTERECTOMY FEMORAL Left 05/21/2020   Procedure: LEFT COMMON FEMORAL ENDARTERECTOMY;  Surgeon: Rosetta Posner, MD;  Location: [facility];  Service: Vascular;  Laterality: Left;  . FEMORAL-POPLITEAL BYPASS GRAFT Left 05/21/2020   Procedure: LEFT FEMORAL TO BELOW KNEE POPLITEAL ARTERY BYPASS GRAFT;  Surgeon: Rosetta Posner, MD;  Location: [facility];  Service: Vascular;  Laterality: Left;  . hysterectomy    . INCISION AND DRAINAGE Left 05/27/14   sebacous cyst, ear  . PRP Left    Dr. Ricki Miller  . removal of cyst from hand    . removal of tumor from foot    . TONSILLECTOMY      There were no vitals filed for this visit.   Subjective  Assessment - 08/13/20 1406    Subjective Reports she has not gotten rubber tip for cane but son says he can get one.    Limitations Lifting;Standing;Walking;House hold activities    Patient Stated Goals To use prosthesis to walk without device, take care of myself, live alone. Go out in community.    Currently in Pain? No/denies                             [facility] Adult PT Treatment/Exercise - 08/13/20 1440      Ambulation/Gait   Ambulation/Gait Yes    Ambulation/Gait Assistance 5: Supervision;4: Min guard    Ambulation/Gait Assistance Details Worked on gait with turns in multi directions (90 deg, 180 deg and  360 deg turns) with cane.  Also had her perform sudden stops working on stepping strategy.  Pt did very well today only needing min/guard for 2 instances however she had been confused about which direction to turn.  Good use of stepping strategy during gait with cane.     Ambulation Distance (Feet) 350 Feet    Assistive device Straight cane;Prosthesis    Gait Pattern Step-through pattern;Decreased arm swing - left;Narrow base of support    Ambulation Surface Level;Indoor      Self-Care   Self-Care Other Self-Care Comments    Other Self-Care Comments  Continue to discuss POC and goals and that if she is progressing well and meeting goals we could DC or if she is progressing but not quite to goal level we could continue with therapy.  She is anxious to get new cover for prosthesis.        Neuro Re-ed    Neuro Re-ed Details  Balance exercises in // bars to address hip and stepping strategy as well as multi sensory challenges:  Standing on foam airex with feet apart maintaining balance x 20 secs>performing head turns x 10 reps (this was difficult so transitioned to 45 deg turns for second set) x 2 sets, vertical head turns x 10 reps (2 sets, again 45 deg turns).  She was able to do full ROM turns on solid ground x 10 reps each without support.  Intermittent UE support needed on airex.  Retro stepping off/on foam airex x 2 sets of 10 reps first with BUE support>single UE support then no UE support (still needed light support from bars and PT).  Transitioned off of airex on solid ground with PT providing varying external perturbations both anterior and posterior to elicit a stepping strategy.  Pt did well but still needs intermittent UE support.  Ended in // bars with quarter turns R and L with light UE support.  Pt needing cues for leading with foot towards direction of turn and pt then able to do without assist from PT.  Carried over to gait, see above for details.                      PT Short  Term Goals - 07/22/20 1324      PT SHORT TERM GOAL #1   Title Patient verbalizes how to manage limb pain / discomfort including adjusting ply socks with limb volume changes with prosthesis. (All STGs Target Date: 07/18/2020)    Baseline 07/22/20: pt. verbalized independence with adjusting ply socks with pain/volume changes.    Time 1    Period Months    Status Achieved    Target Date 07/18/20  PT SHORT TERM GOAL #2   Title Patient tolerates wear of prosthesis >12 hrs total / day & verbalizes adjusting assistive device with limb pain </= 2/10 including outside of PT.    Baseline 07/22/20: pt. reports she wears her prosthesis > 12 hrs/day, but her limb pain can get up to a 5/10 intermittently depending on the day.    Time 1    Period Months    Status Partially Met    Target Date 08/18/20      PT SHORT TERM GOAL #3   Title Berg Balance >36/56    Baseline 07/22/20: 37/56    Time 1    Period Months    Status Achieved    Target Date 07/18/20      PT SHORT TERM GOAL #4   Title Patient ambulates 200' with cane & prosthesis with supervision.    Baseline 07/22/20: pt. able to ambulate 250 ft. with cane & prosthesis but with min. assist by SPTA secondary to occasionaly LOB.    Time 1    Period Months    Status Partially Met    Target Date 08/18/20      PT SHORT TERM GOAL #5   Title Patient negotiates stairs with single rail, ramps & curbs with cane & prosthesis with minA.    Baseline 07/22/20: pt. able to negotiate stairs, ramps & curbs with cane & prosthesis with minA.    Time 1    Period Months    Status Achieved    Target Date 07/18/20             PT Long Term Goals - 06/19/20 0815      PT LONG TERM GOAL #1   Title Patient verbalizes & demonstrates understanding of prosthetic care including modifying assistive device to manage limb pain & diabetic foot care to enable safe utilization. (All LTGs Target Date: 09/12/2020)    Time 12    Period Weeks    Status Revised    Target  Date 09/12/20      PT LONG TERM GOAL #2   Title Patient tolerates prosthesis wear >90% of awake hours & verbalizes appropriate assistive device for environment without skin or residual limb pain to enable function throughout her day.    Time 12    Period Weeks    Status Revised    Target Date 09/12/20      PT LONG TERM GOAL #3   Title Berg Balance >/= 45/56 to indicate lower fall risk.    Time 12    Period Weeks    Status On-going    Target Date 09/12/20      PT LONG TERM GOAL #4   Title Patient ambulates 500' outdoors including grass with cane or less & prosthesis modified independent to enable community mobility.    Time 12    Period Weeks    Status On-going    Target Date 09/12/20      PT LONG TERM GOAL #5   Title Patient negotiates ramps, curbs & stairs with single rail with cane or less & prosthesis modified independent to enable community access.    Time 12    Period Weeks    Status On-going    Target Date 09/12/20      PT LONG TERM GOAL #6   Title Patient demonstrates skills to enable her to return to gardening including kneeling with UE support like garden kneel bench or chair.    Time 12    Period  Weeks    Status On-going    Target Date 09/12/20      PT LONG TERM GOAL #7   Title Patient ambulates 100' with prosthesis only carrying household items in [place] negotiating furniture modified independent.    Time 12    Period Weeks    Status New    Target Date 09/12/20                 Plan - 08/13/20 1506    Clinical Impression Statement Skilled session focused on high level balance for hip and stepping strategy and multi sensory balance challenges as well as continuing to address gait with quad tip cane esp making turns.  Pt progressing very well and will benefit from continued PT.    Personal Factors and Comorbidities Age;Comorbidity 3+;Fitness;Time since onset of injury/illness/exacerbation;Transportation    Comorbidities Right TTA, HTN, DM, CVA, PAD, HLD,  CKD st2,    Examination-Activity Limitations Locomotion Level;Stairs;Stand;Transfers;Toileting    Examination-Participation Restrictions Community Activity;Yard Work;Shop    Stability/Clinical Decision Making Evolving/Moderate complexity    Rehab Potential Good    PT Frequency 2x / week    PT Duration 12 weeks    PT Treatment/Interventions ADLs/Self Care Home Management;DME Instruction;Gait training;Stair training;Functional mobility training;Therapeutic activities;Therapeutic exercise;Balance training;Neuromuscular re-education;Patient/family education;Prosthetic Training;Vestibular;Manual techniques    PT Next Visit Plan gait and balance with turns/distractions, Continue with prosthetic training with SPC and weight shifting activities to the R. Work on [employer] as well as Physicist, medical with Plan of Care Patient           Patient will benefit from skilled therapeutic intervention in order to improve the following deficits and impairments:  Abnormal gait, Decreased activity tolerance, Decreased balance, Decreased endurance, Decreased knowledge of use of DME, Decreased mobility, Decreased skin integrity, Decreased strength, Postural dysfunction, Prosthetic Dependency, Pain  Visit Diagnosis: Other abnormalities of gait and mobility  Muscle weakness (generalized)  Unsteadiness on feet  Weakness generalized  Abnormal posture     Problem List Patient Active Problem List   Diagnosis Date Noted  . PAD (peripheral artery disease) ([place]) 05/21/2020  . Acute metabolic encephalopathy 61/44/3154  . COVID-19 virus infection 02/08/2020  . Acute kidney injury superimposed on CKD ([place]) 02/08/2020  . Status post below-knee amputation of right lower extremity ([place]) 11/07/2019  . Gangrene of right foot ([place])   . Hyponatremia 10/29/2019  . Chronic diastolic CHF (congestive heart failure) ([place]) 10/29/2019  . Stable proliferative diabetic retinopathy of both  eyes associated with type 2 diabetes mellitus ([place]) 06/05/2018  . Acute CVA (cerebrovascular accident) ([place]) 02/18/2017  . Acute ischemic stroke ([place])   . Internuclear ophthalmoplegia of left eye   . Benign paroxysmal positional vertigo   . Abnormality of gait   . Acute onset of severe vertigo 02/17/2017  . Vertigo 02/17/2017  . Atherosclerosis of native artery of extremity with intermittent claudication ([place]) 03/11/2014  . Diabetic retinopathy ([place]) 03/11/2014  . Retinal hemorrhage due to secondary diabetes ([place]) 03/11/2014  . Type II diabetes mellitus with renal manifestations, uncontrolled ([place]) 03/11/2014  . Chronic hepatitis C without hepatic coma ([place]) 03/11/2014  . Hyperlipidemia 03/11/2014  . Hypoglycemia 04/16/2013  . Disorder of bone and cartilage, unspecified   . Other and unspecified hyperlipidemia   . Essential hypertension, benign   . Atherosclerosis of native artery of extremity ([place])   . Chronic kidney disease (CKD), stage II (mild)   . Peripheral arterial disease ([place])   . Anemia   .  Postherpetic neuralgia   . Hypertension     Cameron Sprang, PT, MPT [facility] [address] [place], [place], [address] Phone: [phone]   Fax:  [phone] 08/13/20, 3:08 PM  Name: Monique Cabrera MRN: [id number] Date of Birth: Dec 27, 1935

## 2020-08-15 ENCOUNTER — Ambulatory Visit: Payer: Medicare Other | Admitting: Physical Therapy

## 2020-08-20 ENCOUNTER — Ambulatory Visit: Payer: Medicare Other | Admitting: Rehabilitation

## 2020-08-20 ENCOUNTER — Encounter: Payer: Self-pay | Admitting: Rehabilitation

## 2020-08-20 ENCOUNTER — Other Ambulatory Visit: Payer: Self-pay

## 2020-08-20 DIAGNOSIS — R293 Abnormal posture: Secondary | ICD-10-CM | POA: Diagnosis not present

## 2020-08-20 DIAGNOSIS — M6281 Muscle weakness (generalized): Secondary | ICD-10-CM

## 2020-08-20 DIAGNOSIS — R2689 Other abnormalities of gait and mobility: Secondary | ICD-10-CM | POA: Diagnosis not present

## 2020-08-20 DIAGNOSIS — R2681 Unsteadiness on feet: Secondary | ICD-10-CM | POA: Diagnosis not present

## 2020-08-20 DIAGNOSIS — R531 Weakness: Secondary | ICD-10-CM

## 2020-08-20 NOTE — Therapy (Signed)
[facility] [address] [place], [place], [address] Phone: [phone]   Fax:  [phone]  Physical Therapy Treatment  Patient Details  Name: Monique Cabrera MRN: [id number] Date of Birth: [birth year] Referring Provider (PT): Curt Jews, MD   Encounter Date: 08/20/2020   PT End of Session - 08/20/20 1512    Visit Number 32    Number of Visits 33    Date for PT Re-Evaluation 09/12/20    Authorization Type [employer] Medicare & Medicaid    Progress Note Due on Visit 40    PT Start Time 1402    PT Stop Time 1445    PT Time Calculation (min) 43 min    Activity Tolerance Patient tolerated treatment well;No increased pain    Behavior During Therapy WFL for tasks assessed/performed           Past Medical History:  Diagnosis Date  . Acute upper respiratory infections of unspecified site   . Amputee 08/2019  . Anemia   . Anemia, unspecified   . Arthritis   . Atherosclerosis of native arteries of the extremities, unspecified   . Chest pain, unspecified   . Chronic kidney disease (CKD), stage II (mild)    [facility]  . Diabetes mellitus without complication ([place])    Type II  . Diarrhea   . Disorder of bone and cartilage, unspecified   . Herpes zoster with other nervous system complications(053.19)   . History of blood transfusion   . Hypercalcemia   . Hypertension   . Hypertensive renal disease, benign   . Nonspecific reaction to tuberculin skin test without active tuberculosis(795.51)   . Other and unspecified hyperlipidemia   . PAD (peripheral artery disease) ([place])    Per records from [facility]  . Pain in joint, lower leg   . Peripheral arterial disease ([place])   . Postherpetic neuralgia   . Proteinuria   . Stroke ([place]) 01/2017  . Type II or unspecified type diabetes mellitus with renal manifestations, uncontrolled(250.42)   . Unspecified disorder of kidney and ureter     Past Surgical History:    Procedure Laterality Date  . ABDOMINAL AORTOGRAM W/LOWER EXTREMITY N/A 10/31/2019   Procedure: ABDOMINAL AORTOGRAM W/LOWER EXTREMITY;  Surgeon: Wellington Hampshire, MD;  Location: [facility];  Service: Cardiovascular;  Laterality: N/A;  . ABDOMINAL AORTOGRAM W/LOWER EXTREMITY Left 05/12/2020   Procedure: ABDOMINAL AORTOGRAM W/LOWER EXTREMITY;  Surgeon: Waynetta Sandy, MD;  Location: [facility];  Service: Cardiovascular;  Laterality: Left;  . AMPUTATION Right 11/02/2019   Procedure: AMPUTATION BELOW KNEE RIGHT;  Surgeon: Rosetta Posner, MD;  Location: [facility];  Service: Vascular;  Laterality: Right;  . COLONOSCOPY     x 2  . ENDARTERECTOMY FEMORAL Left 05/21/2020   Procedure: LEFT COMMON FEMORAL ENDARTERECTOMY;  Surgeon: Rosetta Posner, MD;  Location: [facility];  Service: Vascular;  Laterality: Left;  . FEMORAL-POPLITEAL BYPASS GRAFT Left 05/21/2020   Procedure: LEFT FEMORAL TO BELOW KNEE POPLITEAL ARTERY BYPASS GRAFT;  Surgeon: Rosetta Posner, MD;  Location: [facility];  Service: Vascular;  Laterality: Left;  . hysterectomy    . INCISION AND DRAINAGE Left 05/27/14   sebacous cyst, ear  . PRP Left    Dr. Ricki Miller  . removal of cyst from hand    . removal of tumor from foot    . TONSILLECTOMY      There were no vitals filed for this visit.  Subjective Assessment - 08/20/20 1503    Subjective Pts son has still not brought the quad tip cane to her but has one from deceased mother in law that he is supposed to bring to her.    Limitations Lifting;Standing;Walking;House hold activities    Patient Stated Goals To use prosthesis to walk without device, take care of myself, live alone. Go out in community.    Currently in Pain? No/denies                             [name] Adult PT Treatment/Exercise - 08/20/20 1504      Transfers   Transfers Sit to Stand;Stand to Sit    Sit to Stand 6: Modified independent (Device/Increase time)    Stand to Sit 6: Modified  independent (Device/Increase time)      Ambulation/Gait   Ambulation/Gait Yes    Ambulation/Gait Assistance 6: Modified independent (Device/Increase time);5: Supervision    Ambulation/Gait Assistance Details Mod I with use of rollator into clinic, S with use of quad tip cane with continued challenges of making sudden turns and sudden stops.  Pt does tend to ambulate slower with cane however is safer at this time with somewhat slower gait speed.  Did have her increase gait speed slightly at end of session in straight path at S level with min cues for cane placement.     Ambulation Distance (Feet) 400 Feet    Assistive device Straight cane;Prosthesis   with quad tip    Gait Pattern Step-through pattern;Decreased arm swing - left;Narrow base of support    Ambulation Surface Level;Indoor      Self-Care   Self-Care Other Self-Care Comments    Other Self-Care Comments  Spent a large part of session discussing pt wanting to be more independent in community and at home.  Pt reports that she was a part of a wives group that does daily trips and that they are able to accommodate her rollator walker with all tasks.  PT encouraged her to continue with rollator in community setting for safety and efficiency.  Pt verbalized understanding.  Pt also reporting she would love to live on her own again.  Pt currently doing all ADLs at home except for meal prep, however this is mostly due to daughter wanting to do for pt and not "allowing" pt to do for herself.  Pt does at times cook things that she wants to eat when daughter not there.  Pt is cleaning, doing laundry, bathing/dressing on her own.  She does continue to need min cues for sock ply adjustment but overall this has improved greatly.  PT discussed that she could speak with her children about having a "trial run" at home where pt would complete all tasks on her own for a few days at a time.  Also discussed getting apt close to family and option of having aide come  to house a couple of hours per day/or a few days a week to assist as needed, but overall feel that she could live mostly independently with intermittent S (check ins) from family.  Pt verbalized understanding and agreement.  PT then proposed that she finish therapy next week as planned and can work on doing more in community and if needed at end of year or next year come back to work on more independence with cane in community if wanted.  Pt very much agreed and was not aware that she could return  in future.  PT educated to simply request new order from MD in future when/if needed.   Pt verbalized understanding.                     PT Short Term Goals - 07/22/20 1324      PT SHORT TERM GOAL #1   Title Patient verbalizes how to manage limb pain / discomfort including adjusting ply socks with limb volume changes with prosthesis. (All STGs Target Date: 07/18/2020)    Baseline 07/22/20: pt. verbalized independence with adjusting ply socks with pain/volume changes.    Time 1    Period Months    Status Achieved    Target Date 07/18/20      PT SHORT TERM GOAL #2   Title Patient tolerates wear of prosthesis >12 hrs total / day & verbalizes adjusting assistive device with limb pain </= 2/10 including outside of PT.    Baseline 07/22/20: pt. reports she wears her prosthesis > 12 hrs/day, but her limb pain can get up to a 5/10 intermittently depending on the day.    Time 1    Period Months    Status Partially Met    Target Date 08/18/20      PT SHORT TERM GOAL #3   Title Berg Balance >36/56    Baseline 07/22/20: 37/56    Time 1    Period Months    Status Achieved    Target Date 07/18/20      PT SHORT TERM GOAL #4   Title Patient ambulates 200' with cane & prosthesis with supervision.    Baseline 07/22/20: pt. able to ambulate 250 ft. with cane & prosthesis but with min. assist by SPTA secondary to occasionaly LOB.    Time 1    Period Months    Status Partially Met    Target Date  08/18/20      PT SHORT TERM GOAL #5   Title Patient negotiates stairs with single rail, ramps & curbs with cane & prosthesis with minA.    Baseline 07/22/20: pt. able to negotiate stairs, ramps & curbs with cane & prosthesis with minA.    Time 1    Period Months    Status Achieved    Target Date 07/18/20             PT Long Term Goals - 06/19/20 0815      PT LONG TERM GOAL #1   Title Patient verbalizes & demonstrates understanding of prosthetic care including modifying assistive device to manage limb pain & diabetic foot care to enable safe utilization. (All LTGs Target Date: 09/12/2020)    Time 12    Period Weeks    Status Revised    Target Date 09/12/20      PT LONG TERM GOAL #2   Title Patient tolerates prosthesis wear >90% of awake hours & verbalizes appropriate assistive device for environment without skin or residual limb pain to enable function throughout her day.    Time 12    Period Weeks    Status Revised    Target Date 09/12/20      PT LONG TERM GOAL #3   Title Berg Balance >/= 45/56 to indicate lower fall risk.    Time 12    Period Weeks    Status On-going    Target Date 09/12/20      PT LONG TERM GOAL #4   Title Patient ambulates 500' outdoors including grass with cane or less &  prosthesis modified independent to enable community mobility.    Time 12    Period Weeks    Status On-going    Target Date 09/12/20      PT LONG TERM GOAL #5   Title Patient negotiates ramps, curbs & stairs with single rail with cane or less & prosthesis modified independent to enable community access.    Time 12    Period Weeks    Status On-going    Target Date 09/12/20      PT LONG TERM GOAL #6   Title Patient demonstrates skills to enable her to return to gardening including kneeling with UE support like garden kneel bench or chair.    Time 12    Period Weeks    Status On-going    Target Date 09/12/20      PT LONG TERM GOAL #7   Title Patient ambulates 100' with  prosthesis only carrying household items in [place] negotiating furniture modified independent.    Time 12    Period Weeks    Status New    Target Date 09/12/20                 Plan - 08/20/20 1512    Clinical Impression Statement Skilled session focused on discussion of plans on becoming more independent and active in community activities and the hope to return to living independently (with hired help as needed vs intermittent assist from family).  Pt agreeable to d/C next week and return to community tasks (is also going to beach in 2 weeks) and return to PT as/if needed to work on gait with cane in community.  Pt is S with quad tip cane at this time for household amulation.    Personal Factors and Comorbidities Age;Comorbidity 3+;Fitness;Time since onset of injury/illness/exacerbation;Transportation    Comorbidities Right TTA, HTN, DM, CVA, PAD, HLD, CKD st2,    Examination-Activity Limitations Locomotion Level;Stairs;Stand;Transfers;Toileting    Examination-Participation Restrictions Community Activity;Yard Work;Shop    Stability/Clinical Decision Making Evolving/Moderate complexity    Rehab Potential Good    PT Frequency 2x / week    PT Duration 12 weeks    PT Treatment/Interventions ADLs/Self Care Home Management;DME Instruction;Gait training;Stair training;Functional mobility training;Therapeutic activities;Therapeutic exercise;Balance training;Neuromuscular re-education;Patient/family education;Prosthetic Training;Vestibular;Manual techniques    PT Next Visit Plan Monique Cabrera-this lady is the sweetest!! Please work on multi-tasking in ADL kitchen with quad tip cane-have her carry plate/cup from counter to table and back, clean up kitchen, etc.  Turns with quad tip cane, balance.    PT Home Exercise Plan Monique Cabrera-will you see if she wants to have her D/C visit on me next Thursday morning (its totally fine but she just has Monique Cabrera both times next week because my schedule changes).    Consulted  and Agree with Plan of Care Patient           Patient will benefit from skilled therapeutic intervention in order to improve the following deficits and impairments:  Abnormal gait, Decreased activity tolerance, Decreased balance, Decreased endurance, Decreased knowledge of use of DME, Decreased mobility, Decreased skin integrity, Decreased strength, Postural dysfunction, Prosthetic Dependency, Pain  Visit Diagnosis: Other abnormalities of gait and mobility  Muscle weakness (generalized)  Unsteadiness on feet  Weakness generalized  Abnormal posture     Problem List Patient Active Problem List   Diagnosis Date Noted  . PAD (peripheral artery disease) ([place]) 05/21/2020  . Acute metabolic encephalopathy 82/50/0370  . COVID-19 virus infection 02/08/2020  . Acute kidney injury superimposed on  CKD ([place]) 02/08/2020  . Status post below-knee amputation of right lower extremity ([place]) 11/07/2019  . Gangrene of right foot ([place])   . Hyponatremia 10/29/2019  . Chronic diastolic CHF (congestive heart failure) ([place]) 10/29/2019  . Stable proliferative diabetic retinopathy of both eyes associated with type 2 diabetes mellitus ([place]) 06/05/2018  . Acute CVA (cerebrovascular accident) ([place]) 02/18/2017  . Acute ischemic stroke ([place])   . Internuclear ophthalmoplegia of left eye   . Benign paroxysmal positional vertigo   . Abnormality of gait   . Acute onset of severe vertigo 02/17/2017  . Vertigo 02/17/2017  . Atherosclerosis of native artery of extremity with intermittent claudication ([place]) 03/11/2014  . Diabetic retinopathy ([place]) 03/11/2014  . Retinal hemorrhage due to secondary diabetes ([place]) 03/11/2014  . Type II diabetes mellitus with renal manifestations, uncontrolled ([place]) 03/11/2014  . Chronic hepatitis C without hepatic coma ([place]) 03/11/2014  . Hyperlipidemia 03/11/2014  . Hypoglycemia 04/16/2013  . Disorder of bone and cartilage, unspecified   . Other and unspecified hyperlipidemia    . Essential hypertension, benign   . Atherosclerosis of native artery of extremity ([place])   . Chronic kidney disease (CKD), stage II (mild)   . Peripheral arterial disease ([place])   . Anemia   . Postherpetic neuralgia   . Hypertension     Cameron Sprang, PT, MPT [facility] [address] [place], [place], [address] Phone: [phone]   Fax:  [phone] 08/20/20, 3:23 PM  Name: Monique Cabrera MRN: [id number] Date of Birth: [birth year]

## 2020-08-22 ENCOUNTER — Ambulatory Visit: Payer: Medicare Other

## 2020-08-22 ENCOUNTER — Other Ambulatory Visit: Payer: Self-pay

## 2020-08-22 DIAGNOSIS — R2689 Other abnormalities of gait and mobility: Secondary | ICD-10-CM

## 2020-08-22 DIAGNOSIS — M6281 Muscle weakness (generalized): Secondary | ICD-10-CM

## 2020-08-22 DIAGNOSIS — R531 Weakness: Secondary | ICD-10-CM | POA: Diagnosis not present

## 2020-08-22 DIAGNOSIS — R2681 Unsteadiness on feet: Secondary | ICD-10-CM | POA: Diagnosis not present

## 2020-08-22 DIAGNOSIS — R293 Abnormal posture: Secondary | ICD-10-CM | POA: Diagnosis not present

## 2020-08-22 NOTE — Therapy (Signed)
[facility] [name] [facility] [place], [place], [address] Phone: [phone]   Fax:  [phone]  Physical Therapy Treatment/Discharge Summary  Patient Details  Name: Monique Cabrera MRN: [id number] Date of Birth: 12-10-1935 Referring Provider (PT): Curt Jews, MD PHYSICAL THERAPY DISCHARGE SUMMARY  Visits from Start of Care: 25  Current functional level related to goals / functional outcomes: See clinical impression and goals for more information. Pt is at mod I level with rollator and supervision on level surfaces with SPC. Pt requesting discharge today.   Remaining deficits: Impaired balance   Education / Equipment: HEP  Plan: Patient agrees to discharge.  Patient goals were not met. Patient is being discharged due to the patient's request.  ?????       Encounter Date: 08/22/2020   PT End of Session - 08/22/20 1401    Visit Number 33    Number of Visits 95    Date for PT Re-Evaluation 09/12/20    Authorization Type [employer] Medicare & Medicaid    Progress Note Due on Visit 40    PT Start Time 1400    PT Stop Time 1445    PT Time Calculation (min) 45 min    Equipment Utilized During Treatment Gait belt    Activity Tolerance Patient tolerated treatment well;No increased pain    Behavior During Therapy WFL for tasks assessed/performed           Past Medical History:  Diagnosis Date  . Acute upper respiratory infections of unspecified site   . Amputee 08/2019  . Anemia   . Anemia, unspecified   . Arthritis   . Atherosclerosis of native arteries of the extremities, unspecified   . Chest pain, unspecified   . Chronic kidney disease (CKD), stage II (mild)    [facility]  . Diabetes mellitus without complication ([place])    Type II  . Diarrhea   . Disorder of bone and cartilage, unspecified   . Herpes zoster with other nervous system complications(053.19)   . History of blood transfusion   . Hypercalcemia    . Hypertension   . Hypertensive renal disease, benign   . Nonspecific reaction to tuberculin skin test without active tuberculosis(795.51)   . Other and unspecified hyperlipidemia   . PAD (peripheral artery disease) ([place])    Per records from [facility]  . Pain in joint, lower leg   . Peripheral arterial disease ([place])   . Postherpetic neuralgia   . Proteinuria   . Stroke ([place]) 01/2017  . Type II or unspecified type diabetes mellitus with renal manifestations, uncontrolled(250.42)   . Unspecified disorder of kidney and ureter     Past Surgical History:  Procedure Laterality Date  . ABDOMINAL AORTOGRAM W/LOWER EXTREMITY N/A 10/31/2019   Procedure: ABDOMINAL AORTOGRAM W/LOWER EXTREMITY;  Surgeon: Wellington Hampshire, MD;  Location: [facility];  Service: Cardiovascular;  Laterality: N/A;  . ABDOMINAL AORTOGRAM W/LOWER EXTREMITY Left 05/12/2020   Procedure: ABDOMINAL AORTOGRAM W/LOWER EXTREMITY;  Surgeon: Waynetta Sandy, MD;  Location: [facility];  Service: Cardiovascular;  Laterality: Left;  . AMPUTATION Right 11/02/2019   Procedure: AMPUTATION BELOW KNEE RIGHT;  Surgeon: Rosetta Posner, MD;  Location: [facility];  Service: Vascular;  Laterality: Right;  . COLONOSCOPY     x 2  . ENDARTERECTOMY FEMORAL Left 05/21/2020   Procedure: LEFT COMMON FEMORAL ENDARTERECTOMY;  Surgeon: Rosetta Posner, MD;  Location: [facility];  Service: Vascular;  Laterality: Left;  .  FEMORAL-POPLITEAL BYPASS GRAFT Left 05/21/2020   Procedure: LEFT FEMORAL TO BELOW KNEE POPLITEAL ARTERY BYPASS GRAFT;  Surgeon: Rosetta Posner, MD;  Location: [facility];  Service: Vascular;  Laterality: Left;  . hysterectomy    . INCISION AND DRAINAGE Left 05/27/14   sebacous cyst, ear  . PRP Left    Dr. Ricki Miller  . removal of cyst from hand    . removal of tumor from foot    . TONSILLECTOMY      There were no vitals filed for this visit.   Subjective Assessment - 08/22/20 1402    Subjective Pt reports that she has a  really hard time getting home from therapy. Finds she has to wait awhile and is concerned about continuing to use the uber for safety concerns. Would like to take a break and finish therapy today instead of next Friday.    Limitations Lifting;Standing;Walking;House hold activities    Patient Stated Goals To use prosthesis to walk without device, take care of myself, live alone. Go out in community.    Currently in Pain? No/denies                             [facility] Adult PT Treatment/Exercise - 08/22/20 1408      Transfers   Transfers Sit to Stand;Stand to Sit    Sit to Stand 6: Modified independent (Device/Increase time)    Stand to Sit 6: Modified independent (Device/Increase time)      Ambulation/Gait   Ambulation/Gait Yes    Ambulation/Gait Assistance 5: Supervision    Ambulation/Gait Assistance Details Mod I in to clinic on rollator. Supervision with cane. Slower cadence with cane with smaller step length.    Ambulation Distance (Feet) 115 Feet    Assistive device Straight cane   with quad tip   Gait Pattern Step-through pattern;Decreased step length - right;Decreased step length - left;Narrow base of support    Ambulation Surface Level;Indoor    Stairs Yes    Stairs Assistance 5: Supervision    Stair Management Technique One rail Right;With cane;Step to pattern    Number of Stairs 4    Ramp 4: Min assist    Ramp Details (indicate cue type and reason) with cane    Curb 4: Min assist    Curb Details (indicate cue type and reason) with cane      Standardized Balance Assessment   Standardized Balance Assessment Berg Balance Test      Berg Balance Test   Sit to Stand Able to stand without using hands and stabilize independently    Standing Unsupported Able to stand safely 2 minutes    Sitting with Back Unsupported but Feet Supported on Floor or Stool Able to sit safely and securely 2 minutes    Stand to Sit Sits safely with minimal use of hands    Transfers  Able to transfer safely, minor use of hands    Standing Unsupported with Eyes Closed Able to stand 10 seconds safely    Standing Ubsupported with Feet Together Able to place feet together independently and stand 1 minute safely    From Standing, Reach Forward with Outstretched Arm Can reach forward >12 cm safely (5")    From Standing Position, Pick up Object from Floor Able to pick up shoe safely and easily    From Standing Position, Turn to Look Behind Over each Shoulder Turn sideways only but maintains balance    Turn  360 Degrees Able to turn 360 degrees safely but slowly    Standing Unsupported, Alternately Place Feet on Step/Stool Able to complete >2 steps/needs minimal assist    Standing Unsupported, One Foot in Front Able to take small step independently and hold 30 seconds    Standing on One Leg Tries to lift leg/unable to hold 3 seconds but remains standing independently    Total Score 43      Therapeutic Activites    Therapeutic Activities Other Therapeutic Activities    Other Therapeutic Activities Worked in kitchen opening cabinets walking without AD along counter as she does at home independently. Carried cup of water around in gym with [facility] supervision.                  PT Education - 08/22/20 2012    Education Details Discussed plan to discharge today per patient's request. Let her know about improvement in [place] test but still recommending walker for community mobility. Discussed that she could return again for therapy in the future when wanted to work on cane more and would just need a new order from MD    Person(s) Educated Patient    Methods Explanation    Comprehension Verbalized understanding            PT Short Term Goals - 08/22/20 1419      PT SHORT TERM GOAL #1   Title Patient verbalizes how to manage limb pain / discomfort including adjusting ply socks with limb volume changes with prosthesis. (All STGs Target Date: 07/18/2020)    Baseline 07/22/20: pt.  verbalized independence with adjusting ply socks with pain/volume changes.    Time 1    Period Months    Status Achieved    Target Date 07/18/20      PT SHORT TERM GOAL #2   Title Patient tolerates wear of prosthesis >12 hrs total / day & verbalizes adjusting assistive device with limb pain </= 2/10 including outside of PT.    Baseline 08/22/20 pt reports wearing her leg >12 hours. Has minimal pain throughout the day. Occasional achiness.    Time 1    Period Months    Status Achieved    Target Date 08/18/20      PT SHORT TERM GOAL #3   Title Berg Balance >36/56    Baseline 07/22/20: 37/56    Time 1    Period Months    Status Achieved    Target Date 07/18/20      PT SHORT TERM GOAL #4   Title Patient ambulates 200' with cane & prosthesis with supervision.    Baseline Pt able to ambulate 400' with supervision with cane on 08/20/20 visit    Time 1    Period Months    Status Achieved    Target Date 08/18/20      PT SHORT TERM GOAL #5   Title Patient negotiates stairs with single rail, ramps & curbs with cane & prosthesis with minA.    Baseline 07/22/20: pt. able to negotiate stairs, ramps & curbs with cane & prosthesis with minA.    Time 1    Period Months    Status Achieved    Target Date 07/18/20             PT Long Term Goals - 08/22/20 1420      PT LONG TERM GOAL #1   Title Patient verbalizes & demonstrates understanding of prosthetic care including modifying assistive device to manage limb pain &  diabetic foot care to enable safe utilization. (All LTGs Target Date: 09/12/2020)    Baseline Pt reports doing well with prosthesis now with much less pain. Feels better with donning with correct pin position now which has helped a lot. She does inspect limb daily.    Time 12    Period Weeks    Status Achieved      PT LONG TERM GOAL #2   Title Patient tolerates prosthesis wear >90% of awake hours & verbalizes appropriate assistive device for environment without skin or  residual limb pain to enable function throughout her day.    Baseline Pt reports wearing lot all awake hours. Reports she has had none to minimal soreness only in leg.    Time 12    Period Weeks    Status Achieved      PT LONG TERM GOAL #3   Title Berg Balance >/= 45/56 to indicate lower fall risk.    Baseline 43/56 Berg Balance on 08/22/20    Time 12    Period Weeks    Status Not Met      PT LONG TERM GOAL #4   Title Patient ambulates 500' outdoors including grass with cane or less & prosthesis modified independent to enable community mobility.    Baseline Pt is mod I with rollator on varied surfaces but still supervision on level with cane and min assist on nonlevel with cane    Time 12    Period Weeks    Status Not Met      PT LONG TERM GOAL #5   Title Patient negotiates ramps, curbs & stairs with single rail with cane or less & prosthesis modified independent to enable community access.    Baseline Pt is supervision on stairs with rail and cane and min assist on ramp and curb.    Time 12    Period Weeks    Status Not Met      PT LONG TERM GOAL #6   Title Patient demonstrates skills to enable her to return to gardening including kneeling with UE support like garden kneel bench or chair.    Baseline not assessed    Time 12    Period Weeks    Status Not Met      PT LONG TERM GOAL #7   Title Patient ambulates 16' with prosthesis only carrying household items in [place] negotiating furniture modified independent.    Baseline Pt able to ambulate along kitchen counter without UE support supervision but not in open environment.    Time 12    Period Weeks    Status Not Met                 Plan - 08/22/20 2017    Clinical Impression Statement Pt arrived to session requesting to finish up today instead of next week. She is mod I with rollator in the community and still supervision with cane on level surfaces and min asssist on nonlevel. She has been advised to continue to use  walker outside of home. Instructed that she can return in the future if she decides she would like to work on cane more to try to be able to use in the community. Pt was just short of meeting Berg Balance goal with score of 43/56 still indicating fall risk and supporting walker use. PT will discharge at this time per pt request.    Personal Factors and Comorbidities Age;Comorbidity 3+;Fitness;Time since onset of injury/illness/exacerbation;Transportation    Comorbidities  Right TTA, HTN, DM, CVA, PAD, HLD, CKD st2,    Examination-Activity Limitations Locomotion Level;Stairs;Stand;Transfers;Toileting    Examination-Participation Restrictions Community Activity;Yard Work;Shop    Stability/Clinical Decision Making Evolving/Moderate complexity    Rehab Potential Good    PT Frequency 2x / week    PT Duration 12 weeks    PT Treatment/Interventions ADLs/Self Care Home Management;DME Instruction;Gait training;Stair training;Functional mobility training;Therapeutic activities;Therapeutic exercise;Balance training;Neuromuscular re-education;Patient/family education;Prosthetic Training;Vestibular;Manual techniques    PT Next Visit Plan Discharge today per pt request    Consulted and Agree with Plan of Care Patient           Patient will benefit from skilled therapeutic intervention in order to improve the following deficits and impairments:  Abnormal gait, Decreased activity tolerance, Decreased balance, Decreased endurance, Decreased knowledge of use of DME, Decreased mobility, Decreased skin integrity, Decreased strength, Postural dysfunction, Prosthetic Dependency, Pain  Visit Diagnosis: Other abnormalities of gait and mobility  Muscle weakness (generalized)     Problem List Patient Active Problem List   Diagnosis Date Noted  . PAD (peripheral artery disease) ([place]) 05/21/2020  . Acute metabolic encephalopathy 16/09/9603  . COVID-19 virus infection 02/08/2020  . Acute kidney injury  superimposed on CKD ([place]) 02/08/2020  . Status post below-knee amputation of right lower extremity ([place]) 11/07/2019  . Gangrene of right foot ([place])   . Hyponatremia 10/29/2019  . Chronic diastolic CHF (congestive heart failure) ([place]) 10/29/2019  . Stable proliferative diabetic retinopathy of both eyes associated with type 2 diabetes mellitus ([place]) 06/05/2018  . Acute CVA (cerebrovascular accident) ([place]) 02/18/2017  . Acute ischemic stroke ([place])   . Internuclear ophthalmoplegia of left eye   . Benign paroxysmal positional vertigo   . Abnormality of gait   . Acute onset of severe vertigo 02/17/2017  . Vertigo 02/17/2017  . Atherosclerosis of native artery of extremity with intermittent claudication ([place]) 03/11/2014  . Diabetic retinopathy ([place]) 03/11/2014  . Retinal hemorrhage due to secondary diabetes ([place]) 03/11/2014  . Type II diabetes mellitus with renal manifestations, uncontrolled ([place]) 03/11/2014  . Chronic hepatitis C without hepatic coma ([place]) 03/11/2014  . Hyperlipidemia 03/11/2014  . Hypoglycemia 04/16/2013  . Disorder of bone and cartilage, unspecified   . Other and unspecified hyperlipidemia   . Essential hypertension, benign   . Atherosclerosis of native artery of extremity ([place])   . Chronic kidney disease (CKD), stage II (mild)   . Peripheral arterial disease ([place])   . Anemia   . Postherpetic neuralgia   . Hypertension     Electa Sniff, PT, DPT, NCS 08/22/2020, 8:22 PM  [facility] [address] [place], [place], [address] Phone: [phone]   Fax:  [phone]  Name: Monique Cabrera MRN: [id number] Date of Birth: June 12, 1935

## 2020-08-27 ENCOUNTER — Encounter: Payer: Medicare Other | Admitting: Physical Therapy

## 2020-08-29 ENCOUNTER — Encounter: Payer: Medicare Other | Admitting: Physical Therapy

## 2020-08-30 ENCOUNTER — Other Ambulatory Visit: Payer: Self-pay | Admitting: Internal Medicine

## 2020-09-03 ENCOUNTER — Telehealth: Payer: Self-pay | Admitting: Family

## 2020-09-03 NOTE — Telephone Encounter (Signed)
Patient called on call provider 09/03/2020 at 6: 00 pm states has not hard from her social service ride for her appointment tomorrow 09/04/2020 just want to let Dr.Reed know.she does not want to miss her appointment.Advised to call back in the morning to let [facility] office know if she will be coming for her appointment or not. Patient verbalized understanding and will call in the morning.

## 2020-09-04 ENCOUNTER — Encounter: Payer: Self-pay | Admitting: Internal Medicine

## 2020-09-04 ENCOUNTER — Ambulatory Visit (INDEPENDENT_AMBULATORY_CARE_PROVIDER_SITE_OTHER): Payer: Medicare Other | Admitting: Internal Medicine

## 2020-09-04 ENCOUNTER — Other Ambulatory Visit: Payer: Self-pay

## 2020-09-04 VITALS — BP 140/82 | HR 67 | Temp 97.1°F | Ht 65.0 in | Wt 134.0 lb

## 2020-09-04 DIAGNOSIS — I10 Essential (primary) hypertension: Secondary | ICD-10-CM | POA: Diagnosis not present

## 2020-09-04 DIAGNOSIS — E113553 Type 2 diabetes mellitus with stable proliferative diabetic retinopathy, bilateral: Secondary | ICD-10-CM

## 2020-09-04 DIAGNOSIS — N182 Chronic kidney disease, stage 2 (mild): Secondary | ICD-10-CM

## 2020-09-04 DIAGNOSIS — E1165 Type 2 diabetes mellitus with hyperglycemia: Secondary | ICD-10-CM

## 2020-09-04 DIAGNOSIS — Z23 Encounter for immunization: Secondary | ICD-10-CM | POA: Diagnosis not present

## 2020-09-04 DIAGNOSIS — IMO0002 Reserved for concepts with insufficient information to code with codable children: Secondary | ICD-10-CM

## 2020-09-04 DIAGNOSIS — E1169 Type 2 diabetes mellitus with other specified complication: Secondary | ICD-10-CM

## 2020-09-04 DIAGNOSIS — E785 Hyperlipidemia, unspecified: Secondary | ICD-10-CM | POA: Diagnosis not present

## 2020-09-04 DIAGNOSIS — I739 Peripheral vascular disease, unspecified: Secondary | ICD-10-CM

## 2020-09-04 DIAGNOSIS — E1129 Type 2 diabetes mellitus with other diabetic kidney complication: Secondary | ICD-10-CM | POA: Diagnosis not present

## 2020-09-04 DIAGNOSIS — B182 Chronic viral hepatitis C: Secondary | ICD-10-CM

## 2020-09-04 NOTE — Telephone Encounter (Signed)
Patient made it here for her appointment

## 2020-09-04 NOTE — Progress Notes (Signed)
Location:  [facility] clinic Provider:  Zyhir Cappella L. Mariea Clonts, D.O., C.M.D.  Code Status: DNR Goals of Care:  Advanced Directives 09/04/2020  Does Patient Have a Medical Advance Directive? Yes  Type of Advance Directive Out of facility DNR (pink MOST or yellow form)  Does patient want to make changes to medical advance directive? No - Guardian declined  Would patient like information on creating a medical advance directive? -  Pre-existing out of facility DNR order (yellow form or pink MOST form) -     Chief Complaint  Patient presents with  . Medical Management of Chronic Issues    5 month follow up   . Health Maintenance    Influenza   . Acute Visit    information about the covid booster , left leg scar still sore.    HPI: Patient is a 85 y.o. female seen today for medical management of chronic diseases.    She finished her PT two weeks ago.  She is using cane short distance and rollator walker for long distance.  She went on a program [place].  She had taken her cane. She realized she should have used the rollator yesterday.  Her left leg is sore from walking in stones.  She feels like she's sick if she uses the walker. Counseled about using the rollator to prevent falls and hip fractures.   She'd going a program where they exercise on fridays.  They do an outing on thursdays.  She has now moved in with her daughter.  Her sugars have not been low since.  She's eating better with three meals per day and has snacks if needed.  She takes her medicine on time.  She really feels good.  She's happier.  She's not lonely like she was.  She wants to get out and do things.  CBGs may be up to 220-230 except one time in the 300s.  AM CBGs in the 100s.  Almost always over 150.  Last hba1c was 8.3 in May.    She has accepted her flu vaccine today.  Her left leg scar is still sore from her bypass surgery.  She has f/u imaging on that to reassess her blood flow.   She's getting a cover for her  prosthesis tomorrow so she'll be able to wear dresses to church.  She's excited about that.  She was shocked to learn she needs regular f/u about the prosthesis in the future.   She was never treated for hepatitis C.  She was told that she did not require treatment.  She was later told she never did have it.    She had a little protein in her urine at nephrology but does not have to see her again for a year.  Past Medical History:  Diagnosis Date  . Acute upper respiratory infections of unspecified site   . Amputee 08/2019  . Anemia   . Anemia, unspecified   . Arthritis   . Atherosclerosis of native arteries of the extremities, unspecified   . Chest pain, unspecified   . Chronic kidney disease (CKD), stage II (mild)    [facility]  . Diabetes mellitus without complication ([place])    Type II  . Diarrhea   . Disorder of bone and cartilage, unspecified   . Herpes zoster with other nervous system complications(053.19)   . History of blood transfusion   . Hypercalcemia   . Hypertension   . Hypertensive renal disease, benign   .  Nonspecific reaction to tuberculin skin test without active tuberculosis(795.51)   . Other and unspecified hyperlipidemia   . PAD (peripheral artery disease) ([place])    Per records from [name]  . Pain in joint, lower leg   . Peripheral arterial disease ([place])   . Postherpetic neuralgia   . Proteinuria   . Stroke ([place]) 01/2017  . Type II or unspecified type diabetes mellitus with renal manifestations, uncontrolled(250.42)   . Unspecified disorder of kidney and ureter     Past Surgical History:  Procedure Laterality Date  . ABDOMINAL AORTOGRAM W/LOWER EXTREMITY N/A 10/31/2019   Procedure: ABDOMINAL AORTOGRAM W/LOWER EXTREMITY;  Surgeon: Wellington Hampshire, MD;  Location: [facility];  Service: Cardiovascular;  Laterality: N/A;  . ABDOMINAL AORTOGRAM W/LOWER EXTREMITY Left 05/12/2020   Procedure: ABDOMINAL AORTOGRAM W/LOWER EXTREMITY;  Surgeon:  Waynetta Sandy, MD;  Location: [facility];  Service: Cardiovascular;  Laterality: Left;  . AMPUTATION Right 11/02/2019   Procedure: AMPUTATION BELOW KNEE RIGHT;  Surgeon: Rosetta Posner, MD;  Location: [place];  Service: Vascular;  Laterality: Right;  . COLONOSCOPY     x 2  . ENDARTERECTOMY FEMORAL Left 05/21/2020   Procedure: LEFT COMMON FEMORAL ENDARTERECTOMY;  Surgeon: Rosetta Posner, MD;  Location: [facility] [facility];  Service: Vascular;  Laterality: Left;  . FEMORAL-POPLITEAL BYPASS GRAFT Left 05/21/2020   Procedure: LEFT FEMORAL TO BELOW KNEE POPLITEAL ARTERY BYPASS GRAFT;  Surgeon: Rosetta Posner, MD;  Location: [facility];  Service: Vascular;  Laterality: Left;  . hysterectomy    . INCISION AND DRAINAGE Left 05/27/14   sebacous cyst, ear  . PRP Left    Dr. Ricki Miller  . removal of cyst from hand    . removal of tumor from foot    . TONSILLECTOMY      Allergies  Allergen Reactions  . Invokana [Canagliflozin] Itching, Swelling and Other (See Comments)    Vaginal itching, swelling and irritation  . Jardiance [Empagliflozin] Itching, Swelling and Other (See Comments)    Vaginal itching and swelling  . Tuberculin Ppd Other (See Comments)    Per records from [name] Encounter Medications as of 09/04/2020  Medication Sig  . amLODipine (NORVASC) 10 MG tablet TAKE 1 TABLET (10 MG TOTAL) BY MOUTH DAILY. FOR HIGH BLOOD PRESSURE  . ASPIRIN LOW DOSE 81 MG EC tablet Take 81 mg by mouth daily.  Marland Kitchen atorvastatin (LIPITOR) 20 MG tablet TAKE 1 TABLET BY MOUTH EVERY DAY  . carvedilol (COREG) 25 MG tablet TAKE 1 TABLET BY MOUTH TWICE A DAY WITH MEALS  . clopidogrel (PLAVIX) 75 MG tablet TAKE 1 TABLET BY MOUTH EVERY DAY  . feeding supplement, ENSURE ENLIVE, (ENSURE ENLIVE) LIQD Take 237 mLs by mouth 3 (three) times daily between meals.  . feeding supplement, GLUCERNA SHAKE, (GLUCERNA SHAKE) LIQD Take 237 mLs by mouth 3 (three) times daily between meals.  . insulin lispro (HUMALOG) 100  UNIT/ML injection Inject 0.03 mLs (3 Units total) into the skin 3 (three) times daily after meals.  . Insulin Pen Needle (B-D ULTRAFINE III SHORT PEN) 31G X 8 MM MISC Use to check blood sugar every day. Dx: 11.29; 11.65  . LANTUS SOLOSTAR 100 UNIT/ML Solostar Pen Inject 20 Units into the skin daily.  . Multiple Vitamin (MULTIVITAMIN WITH MINERALS) TABS tablet Take 1 tablet by mouth daily.  Glory Rosebush VERIO test strip USE TO TEST BLOOD SUGAR THREE TIMES DAILY. DX: E11.9  . [DISCONTINUED] gabapentin (  NEURONTIN) 100 MG capsule Take 100 mg by mouth 3 (three) times daily as needed (pain).  (Patient not taking: Reported on 06/10/2020)  . [DISCONTINUED] glucose 4 GM chewable tablet Chew 1 tablet (4 g total) by mouth as needed for low blood sugar.  . [DISCONTINUED] HYDROcodone-acetaminophen (NORCO/VICODIN) 5-325 MG tablet Take 1 tablet by mouth every 6 (six) hours as needed for moderate pain. (Patient not taking: Reported on 06/10/2020)   No facility-administered encounter medications on file as of 09/04/2020.    Review of Systems:  Review of Systems  Constitutional: Negative for chills, fever and malaise/fatigue.  HENT: Negative for congestion and hearing loss.   Eyes: Positive for blurred vision.  Respiratory: Negative for cough and shortness of breath.   Cardiovascular: Negative for chest pain, palpitations and leg swelling.  Gastrointestinal: Negative for abdominal pain, blood in stool, constipation, diarrhea and melena.  Genitourinary: Negative for dysuria.  Musculoskeletal: Negative for falls and joint pain.  Skin: Negative for itching and rash.  Neurological: Positive for tingling and sensory change. Negative for loss of consciousness.       Pain at superior aspect of bypass scar on left leg  Endo/Heme/Allergies: Does not bruise/bleed easily.  Psychiatric/Behavioral: Positive for memory loss. Negative for depression. The patient is not nervous/anxious and does not have insomnia.     Health  Maintenance  Topic Date Due  . INFLUENZA VACCINE  07/27/2020  . OPHTHALMOLOGY EXAM  10/16/2020  . FOOT EXAM  11/14/2020  . HEMOGLOBIN A1C  11/21/2020  . DEXA SCAN  Completed  . COVID-19 Vaccine  Completed  . PNA vac Low Risk Adult  Completed    Physical Exam: Vitals:   09/04/20 1052  BP: 140/82  Pulse: 67  Temp: (!) 97.1 F (36.2 C)  TempSrc: Temporal  SpO2: 97%  Weight: 134 lb (60.8 kg)  Height: 5\' 5"  (1.651 m)   Body mass index is 22.3 kg/m. Physical Exam Vitals reviewed.  Constitutional:      Appearance: Normal appearance.  HENT:     Head: Normocephalic and atraumatic.  Eyes:     Comments: glasses  Cardiovascular:     Rate and Rhythm: Normal rate.     Pulses: Normal pulses.     Heart sounds: Normal heart sounds.  Pulmonary:     Effort: Pulmonary effort is normal.     Breath sounds: Normal breath sounds. No wheezing, rhonchi or rales.  Abdominal:     General: Bowel sounds are normal.     Palpations: Abdomen is soft.  Musculoskeletal:        General: Normal range of motion.     Left lower leg: No edema.  Skin:    General: Skin is warm and dry.     Comments: Right amputation site clean, dry, intact; left leg bypass scar closed, benign-appearing  Neurological:     General: No focal deficit present.     Mental Status: She is alert and oriented to person, place, and time.     Sensory: Sensory deficit present.     Motor: Weakness present.     Gait: Gait abnormal.     Comments: Using walker today--has some pain in left leg due to walking with cane yesterday on uneven ground  Psychiatric:        Mood and Affect: Mood normal.        Behavior: Behavior normal.     Labs reviewed: Basic Metabolic Panel: Recent Labs    10/29/19 2336 10/30/19 0552 11/01/19 0403 11/02/19 4627  02/11/20 0160 02/11/20 1093 02/12/20 0332 02/12/20 0332 02/13/20 0504 02/13/20 0504 02/14/20 0714 03/06/20 1603 05/21/20 0948 05/22/20 0248 05/23/20 0245  NA  --    < > 132*    < > 140   < > 137   < > 138   < > 134*   < > 136 135 132*  K  --    < > 3.9   < > 3.7   < > 3.3*   < > 4.1   < > 3.9   < > 3.9 4.6 4.5  CL  --    < > 103   < > 112*   < > 107   < > 110   < > 104   < > 105 103 104  CO2  --    < > 19*   < > 20*   < > 21*   < > 21*   < > 21*   < > 22 21* 21*  GLUCOSE  --    < > 219*   < > 103*   < > 102*   < > 158*   < > 209*   < > 226* 243* 199*  BUN  --    < > 24*   < > 19   < > 19   < > 17   < > 25*   < > 35* 47* 45*  CREATININE  --    < > 1.37*   < > 1.40*   < > 1.60*   < > 1.59*   < > 1.70*   < > 2.15* 2.29* 2.07*  CALCIUM  --    < > 9.9   < > 9.6   < > 9.5   < > 9.2   < > 9.1   < > 10.3 9.3 9.0  MG 1.6*   < > 1.7  --  1.2*   < > 1.2*  --  1.4*  --  1.3*  --   --   --   --   PHOS 2.1*  --  1.8*  --  2.1*  --  2.3*  --   --   --   --   --   --   --   --   TSH 0.989  --   --   --   --   --   --   --   --   --   --   --   --   --   --    < > = values in this interval not displayed.   Liver Function Tests: Recent Labs    02/12/20 0332 02/13/20 0504 05/21/20 0948  AST 20 20 20   ALT 15 14 22   ALKPHOS 54 54 84  BILITOT 0.9 0.7 0.4  PROT 5.5* 5.0* 7.0  ALBUMIN 2.6* 2.2* 3.3*   No results for input(s): LIPASE, AMYLASE in the last 8760 hours. No results for input(s): AMMONIA in the last 8760 hours. CBC: Recent Labs    02/12/20 0332 02/12/20 0332 02/13/20 0504 02/13/20 0504 03/06/20 1603 05/12/20 1435 05/21/20 0948 05/22/20 0248 05/22/20 1033  WBC 6.2   < > 6.2   < > 8.0  --  8.8 11.4* 13.0*  NEUTROABS 3.4  --  3.3  --  4,208  --   --   --   --   HGB 11.1*   < > 10.1*   < > 9.4*   < >  11.3* 9.0* 9.7*  HCT 32.0*   < > 30.0*   < > 28.5*   < > 34.0* 26.4* 29.2*  MCV 89.9   < > 90.9   < > 97.3  --  95.0 94.6 95.7  PLT 233   < > 220   < > 282  --  242 210 223   < > = values in this interval not displayed.   Lipid Panel: Recent Labs    02/09/20 1242  CHOL 242*  HDL 69  LDLCALC 153*  TRIG 98  CHOLHDL 3.5   Lab Results  Component Value Date     HGBA1C 8.3 (H) 05/21/2020   Assessment/Plan 1. Hypoglycemia due to type 2 diabetes mellitus ([place]) - has historically had quite a bit of this, but not recently -need f/u hba1c to see where she stands--says she is doing great and feeling well  2. Need for influenza vaccination - Flu Vaccine QUAD High Dose(Fluad)  3. Essential hypertension, benign -bp at goal, cont current regimen and monitor - COMPLETE METABOLIC PANEL WITH GFR  4. PAD (peripheral artery disease) ([facility]) -s/p bypass on left leg, had bka on right and now uses prosthesis to ambulate with cane or walker - Lipid panel  5. Chronic kidney disease (CKD), stage II (mild) -Avoid nephrotoxic agents like nsaids, dose adjust renally excreted meds, hydrate. - CBC with Differential/Platelet - COMPLETE METABOLIC PANEL WITH GFR  6. Hyperlipidemia associated with type 2 diabetes mellitus ([place]) -cont same statin therapy and f/u lab Lab Results  Component Value Date   LDLCALC 88 09/04/2020   - Lipid panel  7. Stable proliferative diabetic retinopathy of both eyes associated with type 2 diabetes mellitus ([place]) -eye exam up to date--due in sept again  8. Chronic hepatitis C without hepatic coma ([facility]) - Hepatitis C antibody--it's been unclear whether she's had hep c or not--she tells me she was told she did but she did not require treatment   Labs/tests ordered:   Lab Orders     CBC with Differential/Platelet     COMPLETE METABOLIC PANEL WITH GFR     Hemoglobin A1c     Lipid panel     Hepatitis C antibody  Next appt:  01/08/2021--there is a whole set of future labs in from April that never happened and the hep c was not done today as ordered so those can be done next time she comes in before her appt in Jan--cbc, cmp, flp, hba1c and hep c as discussed in detail today  Undra Trembath L. Cheryal Salas, D.O. [facility] [address],  [address] Cell Phone (Mon-Fri 8am-5pm):   [phone] On Call:  [phone] & follow prompts after 5pm & weekends Office Phone:  [phone] Office Fax:  [phone]

## 2020-09-04 NOTE — Patient Instructions (Signed)
Please use your rollator when you come out unfamiliar places or unsteady ground or long distances.

## 2020-09-05 ENCOUNTER — Telehealth: Payer: Self-pay

## 2020-09-05 LAB — LIPID PANEL
Cholesterol: 179 mg/dL (ref <200)
HDL: 76 mg/dL (ref >=50)
LDL Cholesterol (Calc): 88 mg/dL (calc)
Non-HDL Cholesterol (Calc): 103 mg/dL (calc) (ref <130)
Total CHOL/HDL Ratio: 2.4 (calc) (ref <5.0)
Triglycerides: 63 mg/dL (ref <150)

## 2020-09-05 LAB — COMPLETE METABOLIC PANEL WITH GFR
AG Ratio: 1.4 (calc) (ref 1.0–2.5)
ALT: 11 U/L (ref 6–29)
AST: 16 U/L (ref 10–35)
Albumin: 3.8 g/dL (ref 3.6–5.1)
Alkaline phosphatase (APISO): 92 U/L (ref 37–153)
BUN/Creatinine Ratio: 19 (calc) (ref 6–22)
BUN: 36 mg/dL — ABNORMAL HIGH (ref 7–25)
CO2: 24 mmol/L (ref 20–32)
Calcium: 10.1 mg/dL (ref 8.6–10.4)
Chloride: 100 mmol/L (ref 98–110)
Creat: 1.86 mg/dL — ABNORMAL HIGH (ref 0.60–0.88)
GFR, Est African American: 28 mL/min/{1.73_m2} — ABNORMAL LOW (ref >=60)
GFR, Est Non African American: 25 mL/min/{1.73_m2} — ABNORMAL LOW (ref >=60)
Globulin: 2.8 g/dL (calc) (ref 1.9–3.7)
Glucose, Bld: 248 mg/dL — ABNORMAL HIGH (ref 65–99)
Potassium: 3.8 mmol/L (ref 3.5–5.3)
Sodium: 135 mmol/L (ref 135–146)
Total Bilirubin: 0.5 mg/dL (ref 0.2–1.2)
Total Protein: 6.6 g/dL (ref 6.1–8.1)

## 2020-09-05 LAB — CBC WITH DIFFERENTIAL/PLATELET
Absolute Monocytes: 748 cells/uL (ref 200–950)
Basophils Absolute: 71 cells/uL (ref 0–200)
Basophils Relative: 0.8 %
Eosinophils Absolute: 294 cells/uL (ref 15–500)
Eosinophils Relative: 3.3 %
HCT: 35.1 % (ref 35.0–45.0)
Hemoglobin: 11.8 g/dL (ref 11.7–15.5)
Lymphs Abs: 2314 cells/uL (ref 850–3900)
MCH: 31.3 pg (ref 27.0–33.0)
MCHC: 33.6 g/dL (ref 32.0–36.0)
MCV: 93.1 fL (ref 80.0–100.0)
MPV: 10.4 fL (ref 7.5–12.5)
Monocytes Relative: 8.4 %
Neutro Abs: 5474 cells/uL (ref 1500–7800)
Neutrophils Relative %: 61.5 %
Platelets: 231 10*3/uL (ref 140–400)
RBC: 3.77 10*6/uL — ABNORMAL LOW (ref 3.80–5.10)
RDW: 11.9 % (ref 11.0–15.0)
Total Lymphocyte: 26 %
WBC: 8.9 10*3/uL (ref 3.8–10.8)

## 2020-09-05 LAB — HEMOGLOBIN A1C
Hgb A1c MFr Bld: 11.2 % of total Hgb — ABNORMAL HIGH (ref <5.7)
Mean Plasma Glucose: 275 (calc)
eAG (mmol/L): 15.2 (calc)

## 2020-09-05 MED ORDER — LOSARTAN POTASSIUM 50 MG PO TABS
50.0000 mg | ORAL_TABLET | Freq: Every day | ORAL | 1 refills | Status: DC
Start: 2020-09-05 — End: 2020-09-11

## 2020-09-05 MED ORDER — AMLODIPINE-ATORVASTATIN 5-10 MG PO TABS: 1.0000 | ORAL_TABLET | Freq: Every day | ORAL | 3 refills | Status: DC

## 2020-09-05 NOTE — Telephone Encounter (Signed)
Note from [facility] to change medication losartan 50 mg and amlodipine to 5mg . Both are done and sent to the pharmacy. Contacted Monique Cabrera she is aware of the changes and will be going to pick up her new medication.

## 2020-09-08 ENCOUNTER — Other Ambulatory Visit: Payer: Self-pay

## 2020-09-08 ENCOUNTER — Telehealth: Payer: Self-pay

## 2020-09-08 DIAGNOSIS — E785 Hyperlipidemia, unspecified: Secondary | ICD-10-CM

## 2020-09-08 NOTE — Telephone Encounter (Signed)
Time Line  Bonney Leitz, CMA was attempting to address patients labs drawn on 09/04/2020 and sent to clinical pool on Friday.   Dr.Reed had addressed labs on 09/05/2020 @ 5:49 am and sent the to the [facility] Clinical Pool. Labs were not urgent and did not require immediate attention. Lab were being addressed in the 24-48 business hour timeframe per [facility] protocol.  Shelton Silvas noticed that Dr.Reed recommended increasing Lipitor to 40 mg yet the Lipitor (atorvastatin) was on the current medication list as a combination pill ( Caduet 5-10 mg). Shelton Silvas approached me and asked me if she was to change that or should patient be on the combination pill that contained Lipitor and an additional pill of Lipitor, for when Shelton Silvas attempted to send RX for increase dose of Lipitor to the pharmacy she got a duplicate therapy alert.  I informed Shelton Silvas that we needed to look into the combination pill that was added on 09/05/2020 and sent to the pharmacy by Harley Hallmark, [place] (whom was currently our of office until Wednesday 09/10/2020)  Per phone note dated 09/05/2020 Randell Patient was viewing correspondence from [place] and changed medication to Losartan 50 mg and Amlodipine 5 mg.  I asked Shelton Silvas to locate note from [facility] as it was probably in process to be sent to scanning. Note was located in outgoing scanning stack of papers.  Per Dr.Reed's handwritten note on correspondence "make sure our list matches this, with an arrow pointing to medication changes noted from [place]". Dr.Reed also wrote out 50 mg losartan (which differs from what Dr.Kruska had typed) and 5 mg amlodipine.   Somehow when the medication was updated by Charlene in Epic the Losartan was added correctly (according to Dr.Reed's handwritten note, although again it differs from what Dr.Kruska wrote) yet the amlodipine was added as a combination pill that contains amlodipine and atorvastatin (Lipitor) also know as Caduet.   I advised Shelton Silvas to  hold off on further addressing labs with patient. I consulted with Nuala Alpha (pratice administrator), who was out of office and we agreed that we will review tomorrow 09/09/2020 to determine what additional action is needed to clarify this situation, for Dr.Reed is out of office until Thursday 09/11/2020.

## 2020-09-08 NOTE — Telephone Encounter (Signed)
Called pharmacy and asked that rx's for Losartan 50 mg and amlodipine-atorvastatin 5-10 mg be voided until I can get a clarification from the provider.  Spoke with Lanelle Bal at [facility] and RX's will be voided.

## 2020-09-09 NOTE — Telephone Encounter (Signed)
Okay.continue current caudet for now then Dr.Reed to clarify whether still need Atorvastatin increased to 40 mg tablet daily.

## 2020-09-09 NOTE — Telephone Encounter (Signed)
If patient took medication will need to recheck CMP and have Dr.Reed follow up on Thursday.

## 2020-09-09 NOTE — Telephone Encounter (Signed)
Patient never took added medications. I called and canceled them before she picked them up

## 2020-09-09 NOTE — Telephone Encounter (Signed)
Today I conversed with Nuala Alpha, Merchandiser, retail and she concluded that we have Marlowe Sax, NP review the details of this encounter and advise if patients health has been compromised in anyway and then to determine if this should wait for Dr.Reed's return on Thursday or if she will intervene.   [name] please review and advise.  I have the correspondence from [place] if you need to review.

## 2020-09-10 ENCOUNTER — Ambulatory Visit (HOSPITAL_COMMUNITY)
Admission: RE | Admit: 2020-09-10 | Discharge: 2020-09-10 | Disposition: A | Discharge status: Home / Self Care | Payer: Medicare Other | Source: Ambulatory Visit | Attending: Vascular Surgery | Admitting: Vascular Surgery

## 2020-09-10 ENCOUNTER — Other Ambulatory Visit: Payer: Self-pay

## 2020-09-10 ENCOUNTER — Ambulatory Visit (INDEPENDENT_AMBULATORY_CARE_PROVIDER_SITE_OTHER)
Admission: RE | Admit: 2020-09-10 | Discharge: 2020-09-10 | Disposition: A | Discharge status: Home / Self Care | Payer: Medicare Other | Source: Ambulatory Visit | Attending: Vascular Surgery | Admitting: Vascular Surgery

## 2020-09-10 ENCOUNTER — Ambulatory Visit (INDEPENDENT_AMBULATORY_CARE_PROVIDER_SITE_OTHER): Payer: Medicare Other | Admitting: Physician Assistant

## 2020-09-10 VITALS — BP 191/77 | HR 69 | Temp 97.6°F | Resp 20 | Ht 65.0 in | Wt 131.5 lb

## 2020-09-10 DIAGNOSIS — Z23 Encounter for immunization: Secondary | ICD-10-CM

## 2020-09-10 DIAGNOSIS — I739 Peripheral vascular disease, unspecified: Secondary | ICD-10-CM

## 2020-09-10 NOTE — Telephone Encounter (Signed)
Okay.i reviewed medication list and saw Caduet.disregard my previous order then.Let Dr.Reed determine tomorrow.

## 2020-09-10 NOTE — Telephone Encounter (Addendum)
As previously documented, it's presumed  caduet (amlodipine-atorvastatin) was added to the medication list in error.  RX for caduet was canceled for there is no documentation that patient is to be on that medication, that is what prompted this encounter.  I will have Nuala Alpha, Engineer, building services further follow-up to assure that recommendation from covering provider, Marlowe Sax, NP  to be put into action

## 2020-09-10 NOTE — Progress Notes (Signed)
Office Note     CC:  follow up Requesting Provider:  [name], DO  HPI: RENIYAH Cabrera is a 85 y.o. (11-10-1935) female who presents for interval follow-up left femoral endarterectomy and left femoral to below-knee popliteal bypass with Gore-Tex for critical limb ischemia.  This was performed on May 21, 2020 by Dr. Donnetta Hutching.  She also had history of ulceration at the tip of her left second toe that was showing signs of good healing at her last visit in June of this year.  She recently went on a senior citizens outing and had only her cane.  She did not bring her rolling walker.  She states she did more walking than expected and has some residual soreness.  She denies claudication or rest pain.  Her left second toe ulcer has healed.  Compliant with Plavix and aspirin, statin.  Her medication list is reviewed.  Medical record indicates atorvastatin has been discontinued but the patient states she takes this daily. Past Medical History:  Diagnosis Date  . Acute upper respiratory infections of unspecified site   . Amputee 08/2019  . Anemia   . Anemia, unspecified   . Arthritis   . Atherosclerosis of native arteries of the extremities, unspecified   . Chest pain, unspecified   . Chronic kidney disease (CKD), stage II (mild)    [facility]  . Diabetes mellitus without complication ([place])    Type II  . Diarrhea   . Disorder of bone and cartilage, unspecified   . Herpes zoster with other nervous system complications(053.19)   . History of blood transfusion   . Hypercalcemia   . Hypertension   . Hypertensive renal disease, benign   . Nonspecific reaction to tuberculin skin test without active tuberculosis(795.51)   . Other and unspecified hyperlipidemia   . PAD (peripheral artery disease) ([place])    Per records from [facility]  . Pain in joint, lower leg   . Peripheral arterial disease ([place])   . Postherpetic neuralgia   . Proteinuria   . Stroke ([place]) 01/2017  . Type II or  unspecified type diabetes mellitus with renal manifestations, uncontrolled(250.42)   . Unspecified disorder of kidney and ureter     Past Surgical History:  Procedure Laterality Date  . ABDOMINAL AORTOGRAM W/LOWER EXTREMITY N/A 10/31/2019   Procedure: ABDOMINAL AORTOGRAM W/LOWER EXTREMITY;  Surgeon: Wellington Hampshire, MD;  Location: [facility];  Service: Cardiovascular;  Laterality: N/A;  . ABDOMINAL AORTOGRAM W/LOWER EXTREMITY Left 05/12/2020   Procedure: ABDOMINAL AORTOGRAM W/LOWER EXTREMITY;  Surgeon: Waynetta Sandy, MD;  Location: [place] [facility];  Service: Cardiovascular;  Laterality: Left;  . AMPUTATION Right 11/02/2019   Procedure: AMPUTATION BELOW KNEE RIGHT;  Surgeon: Rosetta Posner, MD;  Location: [facility];  Service: Vascular;  Laterality: Right;  . COLONOSCOPY     x 2  . ENDARTERECTOMY FEMORAL Left 05/21/2020   Procedure: LEFT COMMON FEMORAL ENDARTERECTOMY;  Surgeon: Rosetta Posner, MD;  Location: [facility];  Service: Vascular;  Laterality: Left;  . FEMORAL-POPLITEAL BYPASS GRAFT Left 05/21/2020   Procedure: LEFT FEMORAL TO BELOW KNEE POPLITEAL ARTERY BYPASS GRAFT;  Surgeon: Rosetta Posner, MD;  Location: [facility];  Service: Vascular;  Laterality: Left;  . hysterectomy    . INCISION AND DRAINAGE Left 05/27/14   sebacous cyst, ear  . PRP Left    Dr. Ricki Miller  . removal of cyst from hand    . removal of tumor from foot    .  TONSILLECTOMY      Social History   Socioeconomic History  . Marital status: Widowed    Spouse name: Not on file  . Number of children: 6  . Years of education: 42  . Highest education level: Not on file  Occupational History    Comment: retired, [employer]  Tobacco Use  . Smoking status: Former Smoker    Types: Cigarettes  . Smokeless tobacco: Never Used  . Tobacco comment: Quit about age 83  " it was really a habit"  Vaping Use  . Vaping Use: Never used  Substance and Sexual Activity  . Alcohol use: No    Alcohol/week: 0.0 standard drinks  .  Drug use: No  . Sexual activity: Never  Other Topics Concern  . Not on file  Social History Narrative   Has moved in with her daughter.   Caffeine- coffee 2-3 cups daily, soda off and on   Social Determinants of Health   Financial Resource Strain:   . Difficulty of Paying Living Expenses: Not on file  Food Insecurity:   . Worried About Charity fundraiser in the Last Year: Not on file  . Ran Out of Food in the Last Year: Not on file  Transportation Needs:   . Lack of Transportation (Medical): Not on file  . Lack of Transportation (Non-Medical): Not on file  Physical Activity:   . Days of Exercise per Week: Not on file  . Minutes of Exercise per Session: Not on file  Stress:   . Feeling of Stress : Not on file  Social Connections:   . Frequency of Communication with Friends and Family: Not on file  . Frequency of Social Gatherings with Friends and Family: Not on file  . Attends Religious Services: Not on file  . Active Member of Clubs or Organizations: Not on file  . Attends Archivist Meetings: Not on file  . Marital Status: Not on file  Intimate Partner Violence:   . Fear of Current or Ex-Partner: Not on file  . Emotionally Abused: Not on file  . Physically Abused: Not on file  . Sexually Abused: Not on file   Family History  Problem Relation Age of Onset  . Diabetes Mother   . Diabetes Father   . Diabetes Sister   . Diabetes Sister     Current Outpatient Medications  Medication Sig Dispense Refill  . amLODipine-atorvastatin (CADUET) 5-10 MG tablet Take 1 tablet by mouth daily. 5mg  30 tablet 3  . ASPIRIN LOW DOSE 81 MG EC tablet Take 81 mg by mouth daily.    . carvedilol (COREG) 25 MG tablet TAKE 1 TABLET BY MOUTH TWICE A DAY WITH MEALS 180 tablet 1  . clopidogrel (PLAVIX) 75 MG tablet TAKE 1 TABLET BY MOUTH EVERY DAY 90 tablet 1  . feeding supplement, ENSURE ENLIVE, (ENSURE ENLIVE) LIQD Take 237 mLs by mouth 3 (three) times daily between meals. 237 mL 12    . feeding supplement, GLUCERNA SHAKE, (GLUCERNA SHAKE) LIQD Take 237 mLs by mouth 3 (three) times daily between meals.  0  . insulin lispro (HUMALOG) 100 UNIT/ML injection Inject 0.03 mLs (3 Units total) into the skin 3 (three) times daily after meals. 10 mL 11  . Insulin Pen Needle (B-D ULTRAFINE III SHORT PEN) 31G X 8 MM MISC Use to check blood sugar every day. Dx: 11.29; 11.65 100 each 1  . LANTUS SOLOSTAR 100 UNIT/ML Solostar Pen Inject 20 Units into the skin daily.    Marland Kitchen  losartan (COZAAR) 50 MG tablet Take 1 tablet (50 mg total) by mouth daily. 30 tablet 1  . Multiple Vitamin (MULTIVITAMIN WITH MINERALS) TABS tablet Take 1 tablet by mouth daily.    Glory Rosebush VERIO test strip USE TO TEST BLOOD SUGAR THREE TIMES DAILY. DX: E11.9 300 strip 3   No current facility-administered medications for this visit.    Allergies  Allergen Reactions  . Invokana [Canagliflozin] Itching, Swelling and Other (See Comments)    Vaginal itching, swelling and irritation  . Jardiance [Empagliflozin] Itching, Swelling and Other (See Comments)    Vaginal itching and swelling  . Tuberculin Ppd Other (See Comments)    Per records from [facility]:   [X]  denotes positive finding, [ ]  denotes negative finding Cardiac  Comments:  Chest pain or chest pressure:    Shortness of breath upon exertion:    Short of breath when lying flat:    Irregular heart rhythm:        Vascular    Pain in calf, thigh, or hip brought on by ambulation:    Pain in feet at night that wakes you up from your sleep:     Blood clot in your veins:    Leg swelling:         Pulmonary    Oxygen at home:    Productive cough:     Wheezing:         Neurologic    Sudden weakness in arms or legs:     Sudden numbness in arms or legs:     Sudden onset of difficulty speaking or slurred speech:    Temporary loss of vision in one eye:     Problems with dizziness:         Gastrointestinal    Blood in stool:      Vomited blood:         Genitourinary    Burning when urinating:     Blood in urine:        Psychiatric    Major depression:         Hematologic    Bleeding problems:    Problems with blood clotting too easily:        Skin    Rashes or ulcers:        Constitutional    Fever or chills:      PHYSICAL EXAMINATION:  There were no vitals filed for this visit.  General:  WDWN in NAD; vital signs documented above Gait: Not observed HENT: WNL, normocephalic Pulmonary: normal non-labored breathing , without Rales, rhonchi,  wheezing Cardiac: regular HR, without  Murmurs without carotid bruit Abdomen: soft, NT, no masses Skin: without rashes Vascular Exam/Pulses: 2+ brachial pulses bilaterally.  Weakly palpable right radial pulse.  2+ left radial pulse.  2+ right femoral pulse, 3+ left femoral pulse.  She has brisk monophasic dorsalis pedis and peroneal artery Doppler signals.  Digital signal is present Extremities: without ischemic changes, without Gangrene , without cellulitis; without open wounds;  Musculoskeletal: no muscle wasting or atrophy  Neurologic: A&O X 3;  No focal weakness or paresthesias are detected Psychiatric:  The pt has Normal affect.   Non-Invasive Vascular Imaging:   09/10/2020 ABI/TBIToday's ABIToday's TBIPrevious ABIPrevious TBI  +-------+-----------+-----------+------------+------------+  Right BKA                        +-------+-----------+-----------+------------+------------+  Left  [place]  0.3                  +-------+-----------+-----------+------------+------------+   This is the first post op exam.  Summary:  Left: Resting left ankle-brachial index indicates noncompressible left  lower extremity arteries. The left toe-brachial index is abnormal. TBIs  are unreliable. LT Great toe pressure = 57 mmHg.  Summary:  Left: Patent left fem-BK popliteal bypass graft with no stenosis noted.   Normal velocities with monophasic and biphasic signals.  ASSESSMENT/PLAN:: 85 y.o. female here for follow up for peripheral arterial disease.  She is status post left femoral endarterectomy and common femoral to below-knee popliteal artery bypass with Gore-Tex graft.  Her graft is patent.  Noncompressible left lower extremity arteries.  Toe pressure 57.  Her foot is warm and well-perfused.  Her toe ulcer has healed. Continue aspirin, statin, Plavix.  Continue walking program. Follow-up in 6 months with arterial studies. Call our office for onset of lower extremity pain with exercise, rest pain, nail infection or nonhealing wounds.  Barbie Banner, PA-C [facility] [phone]  Clinic MD:   Donzetta Matters

## 2020-09-11 ENCOUNTER — Other Ambulatory Visit: Payer: Self-pay

## 2020-09-11 ENCOUNTER — Telehealth: Payer: Self-pay

## 2020-09-11 MED ORDER — ATORVASTATIN CALCIUM 40 MG PO TABS: 40.0000 mg | ORAL_TABLET | Freq: Every day | ORAL | 1 refills | Status: DC

## 2020-09-11 MED ORDER — FREESTYLE LIBRE 14 DAY READER DEVI: 1.0000 | Freq: Every day | 12 refills | Status: DC | PRN

## 2020-09-11 MED ORDER — FREESTYLE LIBRE 14 DAY SENSOR MISC: 1.0000 | Freq: Four times a day (QID) | 12 refills | Status: DC

## 2020-09-11 MED ORDER — FREESTYLE LIBRE 14 DAY READER DEVI: 1.0000 | Freq: Four times a day (QID) | 12 refills | Status: DC

## 2020-09-11 MED ORDER — FREESTYLE LIBRE 14 DAY SENSOR MISC: 1.0000 | Freq: Every day | 12 refills | Status: DC | PRN

## 2020-09-11 NOTE — Addendum Note (Signed)
Addended by: Logan Bores on: 09/11/2020 02:36 PM   Modules accepted: Orders

## 2020-09-11 NOTE — Addendum Note (Signed)
Addended by: Logan Bores on: 09/11/2020 02:58 PM   Modules accepted: Orders

## 2020-09-11 NOTE — Telephone Encounter (Signed)
The nephrology assessment and plan increased the losartan to 50mg  later on in the note hence the 50mg  notation I made.  Also, she was to take the 5mg  amlodipine daily.  I have seen another note when she saw her other specialist since where she told them she was taking atorvastatin though it had mysteriously been removed from her med list.  Can we also straighten that out?  There is also no reason we should be having to send in the prescriptions that nephrology changed, only updating her list so everything matches.  The specialists are responsible for sending the updated prescriptions to the pharmacy.  I would like for her to bring all of her medications including bottles, insulins and glucometer along at the next appt.

## 2020-09-11 NOTE — Telephone Encounter (Signed)
The 9/23 appt should be fine.  She needs transportation so cannot usually come at the last minute.

## 2020-09-11 NOTE — Telephone Encounter (Signed)
-----   Message from Hart Robinsons sent at 09/11/2020 12:05 PM EDT ----- Regarding: RE: Flu Vaccine [place]  Can you please add the billing.  I don't see it.  [place]  ----- Message ----- From: Tanna Savoy, CMA Sent: 09/11/2020  11:54 AM EDT To: Hart Robinsons Subject: FW: Flu Vaccine                                Patient did receive vaccine  ----- Message ----- From: Hart Robinsons Sent: 09/11/2020   9:56 AM EDT To: [name], DO, Tanna Savoy, CMA Subject: Flu Vaccine                                    Hi  For date of service 09/04/20 it appears that patient was given flu vaccine.  There is no billing for this.  Please add if patient did receive flu vaccine.    Thank you   [employer]

## 2020-09-11 NOTE — Telephone Encounter (Signed)
Called patient and scheduled appointment with Dr.Reed on 09/18/2020.  Patient reviewed medications with me on the phone. Patient looked at her pill bottles and read off to me the names (spelled them out), dosage, and instructions.  Patient is NOT taking amlodipine 5 mg as noted in nephrology note. Patient is taking amlodipine 10 mg and states they never told her to decrease medication at the nephrology office. Patients states I told y'all I was taking 10 mg at my last visit.    Patient also states she is NOT taking Losartan. Patient is taking Valsartan/HCT 320-25 mg once daily. Patient states she tells [place] every time she comes in that she is taking Valsartan yet we never update her list. Patient mentioned she has taken Valsartan-HCTZ for years.  All other medications were accurate and correlated with our list.   I have updated list based on what patient is actually taking

## 2020-09-11 NOTE — Telephone Encounter (Signed)
Thank you Dr.Reed for clarifying:  1.) Monique Cabrera, CMA removed Lipitor (atorvastatin) 20 mg as she was about to add the 40 mg Lipitor as recommended in recent labs addressed by you (on 09/05/2020). As Monique Cabrera was attempting to perform this action she got a high risk alert warning for duplicate therapy for caduet (amlodipine-atorvastatin) 5-10 mg and atorvastatin 40 mg (duplicate statin therapy). Caduet was added to her list in error. I added the 20 mg back as this is what patient is currently taking for we have not contacted her about recent labs due to this incident that needed clarification from [facility] and Dr.Reed.    2.) Monique Cabrera, please correct the error of adding caduet by removing it  and adding amlodipine 5 mg (as Dr.Reed stated no rx needs to be sent to pharmacy)  3..) Dr.Reed are you willing to work patient in today to review medication and address labs,? You next available appointment in office is 09/18/2020.   Awaiting reply from Dr.Reed and Randell Patient

## 2020-09-11 NOTE — Telephone Encounter (Signed)
Medication was  changed to Amlodipine 5 mg daily (Norvasc).

## 2020-09-12 ENCOUNTER — Other Ambulatory Visit: Payer: Self-pay | Admitting: *Deleted

## 2020-09-12 DIAGNOSIS — I739 Peripheral vascular disease, unspecified: Secondary | ICD-10-CM

## 2020-09-12 NOTE — Addendum Note (Signed)
Addended by: Tanna Savoy on: 09/12/2020 09:17 AM   Modules accepted: Orders

## 2020-09-18 ENCOUNTER — Ambulatory Visit (INDEPENDENT_AMBULATORY_CARE_PROVIDER_SITE_OTHER): Payer: Medicare Other | Admitting: Nurse Practitioner

## 2020-09-18 ENCOUNTER — Other Ambulatory Visit: Payer: Self-pay

## 2020-09-18 ENCOUNTER — Encounter: Payer: Self-pay | Admitting: Nurse Practitioner

## 2020-09-18 VITALS — BP 142/78 | HR 68 | Temp 97.1°F | Ht 65.0 in | Wt 134.4 lb

## 2020-09-18 DIAGNOSIS — I10 Essential (primary) hypertension: Secondary | ICD-10-CM

## 2020-09-18 DIAGNOSIS — N184 Chronic kidney disease, stage 4 (severe): Secondary | ICD-10-CM

## 2020-09-18 DIAGNOSIS — R809 Proteinuria, unspecified: Secondary | ICD-10-CM

## 2020-09-18 DIAGNOSIS — E1165 Type 2 diabetes mellitus with hyperglycemia: Secondary | ICD-10-CM

## 2020-09-18 DIAGNOSIS — IMO0002 Reserved for concepts with insufficient information to code with codable children: Secondary | ICD-10-CM

## 2020-09-18 DIAGNOSIS — Z7689 Persons encountering health services in other specified circumstances: Secondary | ICD-10-CM | POA: Diagnosis not present

## 2020-09-18 DIAGNOSIS — I739 Peripheral vascular disease, unspecified: Secondary | ICD-10-CM

## 2020-09-18 DIAGNOSIS — E1129 Type 2 diabetes mellitus with other diabetic kidney complication: Secondary | ICD-10-CM | POA: Diagnosis not present

## 2020-09-18 DIAGNOSIS — E1151 Type 2 diabetes mellitus with diabetic peripheral angiopathy without gangrene: Secondary | ICD-10-CM

## 2020-09-18 DIAGNOSIS — K5904 Chronic idiopathic constipation: Secondary | ICD-10-CM

## 2020-09-18 MED ORDER — LANTUS SOLOSTAR 100 UNIT/ML ~~LOC~~ SOPN: 24.0000 [IU] | PEN_INJECTOR | Freq: Every day | SUBCUTANEOUS | 1 refills | Status: DC

## 2020-09-18 MED ORDER — LOSARTAN POTASSIUM 50 MG PO TABS: 50.0000 mg | ORAL_TABLET | Freq: Every day | ORAL | 1 refills | Status: DC

## 2020-09-18 NOTE — Progress Notes (Signed)
Careteam: Patient Care Team: [name], DO as PCP - General (Geriatric Medicine) Wellington Hampshire, MD as PCP - Cardiology (Cardiology) Adrian Prows, MD as Consulting Physician (Cardiology) Rutherford Guys, MD as Consulting Physician (Ophthalmology) Justin Mend, MD as Attending Physician (Internal Medicine)  PLACE OF SERVICE:  [place] Directive information Does Patient Have a Medical Advance Directive?: Yes, Type of Advance Directive: Out of facility DNR (pink MOST or yellow form), Pre-existing out of facility DNR order (yellow form or pink MOST form): Pink MOST/Yellow Form most recent copy in chart - Physician notified to receive inpatient order, Does patient want to make changes to medical advance directive?: No - Guardian declined  Allergies  Allergen Reactions  . Invokana [Canagliflozin] Itching, Swelling and Other (See Comments)    Vaginal itching, swelling and irritation  . Jardiance [Empagliflozin] Itching, Swelling and Other (See Comments)    Vaginal itching and swelling  . Tuberculin Ppd Other (See Comments)    Per records from [facility] Complaint  Patient presents with  . Medication Management    Go over medications in detail to make sure of what she is taking on a daily basis      HPI: Patient is a 85 y.o. female for follow up on medication  She does not drive and has to get a ride to appts and to pharmacy- the pharmacy did not have the freestyle [place] and supply. Plans to come back at a later date to get.  Hypertension- taking amlodipine 10 mg daily, carvedilol 25 mg twice daily, REPORTS VALSARTAN-HCTZ HAS NEVER BEEN ON HER LIST but she has been taking for years. She is also taking LOSARTAN 25 mg daily. She can take her blood pressure but not sure what it means.  At her last nephrologist follow up on 08/05/20 her nephrologist told her to increase losartan to 50 mg daily and decrease norvasc to 5 mg however her valsartan-hctz was  not on her list. She has not made the changes recommended by nephrologist.   Hyperlipidemia- lipitor 40 mg daily   PAD- on clopidogrel 75mg  with ASA 81 mg daily   DM- taking lantus 20 units daily in the morning with humalog 3 units for breakfast (always), Lunch (sometimes does not eat lunch so will not take then) and dinner (always)  Was told lantus was going to be increased but has not been.  Blood sugars are always high. 329 avg over the last week- does not check fasting most of the time.  No low blood sugars.  Overall eating well and gaining weight.   Reports she is only having a bowel movement once weekly- when she has taken a laxative she will then have diarrhea.    Review of Systems:  Review of Systems  Constitutional: Negative for chills, fever, malaise/fatigue and weight loss.  Respiratory: Negative for cough, sputum production and shortness of breath.   Cardiovascular: Negative for chest pain, palpitations and leg swelling.  Gastrointestinal: Positive for constipation. Negative for abdominal pain, diarrhea and heartburn.  Genitourinary: Negative for dysuria, frequency and urgency.  Musculoskeletal: Negative for back pain, falls, joint pain and myalgias.  Skin: Negative.   Neurological: Negative for dizziness and headaches.  Psychiatric/Behavioral: Negative for depression and memory loss. The patient does not have insomnia.     Past Medical History:  Diagnosis Date  . Acute upper respiratory infections of unspecified site   . Amputee 08/2019  . Anemia   .  Anemia, unspecified   . Arthritis   . Atherosclerosis of native arteries of the extremities, unspecified   . Chest pain, unspecified   . Chronic kidney disease (CKD), stage II (mild)    [facility]  . Diabetes mellitus without complication ([place])    Type II  . Diarrhea   . Disorder of bone and cartilage, unspecified   . Herpes zoster with other nervous system complications(053.19)   . History of blood  transfusion   . Hypercalcemia   . Hypertension   . Hypertensive renal disease, benign   . Nonspecific reaction to tuberculin skin test without active tuberculosis(795.51)   . Other and unspecified hyperlipidemia   . PAD (peripheral artery disease) ([place])    Per records from [facility]  . Pain in joint, lower leg   . Peripheral arterial disease ([place])   . Postherpetic neuralgia   . Proteinuria   . Stroke ([place]) 01/2017  . Type II or unspecified type diabetes mellitus with renal manifestations, uncontrolled(250.42)   . Unspecified disorder of kidney and ureter    Past Surgical History:  Procedure Laterality Date  . ABDOMINAL AORTOGRAM W/LOWER EXTREMITY N/A 10/31/2019   Procedure: ABDOMINAL AORTOGRAM W/LOWER EXTREMITY;  Surgeon: Wellington Hampshire, MD;  Location: [place] [facility];  Service: Cardiovascular;  Laterality: N/A;  . ABDOMINAL AORTOGRAM W/LOWER EXTREMITY Left 05/12/2020   Procedure: ABDOMINAL AORTOGRAM W/LOWER EXTREMITY;  Surgeon: Waynetta Sandy, MD;  Location: [facility];  Service: Cardiovascular;  Laterality: Left;  . AMPUTATION Right 11/02/2019   Procedure: AMPUTATION BELOW KNEE RIGHT;  Surgeon: Rosetta Posner, MD;  Location: [place];  Service: Vascular;  Laterality: Right;  . COLONOSCOPY     x 2  . ENDARTERECTOMY FEMORAL Left 05/21/2020   Procedure: LEFT COMMON FEMORAL ENDARTERECTOMY;  Surgeon: Rosetta Posner, MD;  Location: [place];  Service: Vascular;  Laterality: Left;  . FEMORAL-POPLITEAL BYPASS GRAFT Left 05/21/2020   Procedure: LEFT FEMORAL TO BELOW KNEE POPLITEAL ARTERY BYPASS GRAFT;  Surgeon: Rosetta Posner, MD;  Location: [facility];  Service: Vascular;  Laterality: Left;  . hysterectomy    . INCISION AND DRAINAGE Left 05/27/14   sebacous cyst, ear  . PRP Left    Dr. Ricki Miller  . removal of cyst from hand    . removal of tumor from foot    . TONSILLECTOMY     Social History:   reports that she has quit smoking. Her smoking use included cigarettes. She has  never used smokeless tobacco. She reports that she does not drink alcohol and does not use drugs.  Family History  Problem Relation Age of Onset  . Diabetes Mother   . Diabetes Father   . Diabetes Sister   . Diabetes Sister     Medications: Patient's Medications  New Prescriptions   No medications on file  Previous Medications   AMLODIPINE (NORVASC) 10 MG TABLET    Take 10 mg by mouth daily.   ASPIRIN LOW DOSE 81 MG EC TABLET    Take 81 mg by mouth daily.   ATORVASTATIN (LIPITOR) 40 MG TABLET    Take 1 tablet (40 mg total) by mouth daily.   CARVEDILOL (COREG) 25 MG TABLET    TAKE 1 TABLET BY MOUTH TWICE A DAY WITH MEALS   CLOPIDOGREL (PLAVIX) 75 MG TABLET    TAKE 1 TABLET BY MOUTH EVERY DAY   CONTINUOUS BLOOD GLUC RECEIVER (FREESTYLE LIBRE 14 DAY READER) DEVI    1 each by Does  not apply route in the morning, at noon, in the evening, and at bedtime. E11.29, E11.65   CONTINUOUS BLOOD GLUC SENSOR (FREESTYLE LIBRE 14 DAY SENSOR) MISC    1 each by Does not apply route in the morning, at noon, in the evening, and at bedtime. E11.29, E11.65   FEEDING SUPPLEMENT, ENSURE ENLIVE, (ENSURE ENLIVE) LIQD    Take 237 mLs by mouth 3 (three) times daily between meals.   FEEDING SUPPLEMENT, GLUCERNA SHAKE, (GLUCERNA SHAKE) LIQD    Take 237 mLs by mouth 3 (three) times daily between meals.   INSULIN LISPRO (HUMALOG) 100 UNIT/ML INJECTION    Inject 0.03 mLs (3 Units total) into the skin 3 (three) times daily after meals.   INSULIN PEN NEEDLE (B-D ULTRAFINE III SHORT PEN) 31G X 8 MM MISC    Use to check blood sugar every day. Dx: 11.29; 11.65   LANTUS SOLOSTAR 100 UNIT/ML SOLOSTAR PEN    Inject 20 Units into the skin daily.   MULTIPLE VITAMIN (MULTIVITAMIN WITH MINERALS) TABS TABLET    Take 1 tablet by mouth daily.   ONETOUCH VERIO TEST STRIP    USE TO TEST BLOOD SUGAR THREE TIMES DAILY. DX: E11.9   VALSARTAN-HYDROCHLOROTHIAZIDE (DIOVAN-HCT) 320-25 MG TABLET    Take 1 tablet by mouth daily.  Modified  Medications   No medications on file  Discontinued Medications   No medications on file    Physical Exam:  Vitals:   09/18/20 0818  BP: (!) 142/78  Pulse: 68  Temp: (!) 97.1 F (36.2 C)  TempSrc: Temporal  SpO2: 98%  Weight: 134 lb 6.4 oz (61 kg)  Height: 5\' 5"  (1.651 m)   Body mass index is 22.37 kg/m. Wt Readings from Last 3 Encounters:  09/18/20 134 lb 6.4 oz (61 kg)  09/10/20 131 lb 8 oz (59.6 kg)  09/04/20 134 lb (60.8 kg)    Physical Exam Constitutional:      General: She is not in acute distress.    Appearance: She is well-developed. She is not diaphoretic.  HENT:     Head: Normocephalic and atraumatic.     Mouth/Throat:     Pharynx: No oropharyngeal exudate.  Eyes:     Conjunctiva/sclera: Conjunctivae normal.     Pupils: Pupils are equal, round, and reactive to light.  Cardiovascular:     Rate and Rhythm: Normal rate and regular rhythm.     Heart sounds: Normal heart sounds.  Pulmonary:     Effort: Pulmonary effort is normal.     Breath sounds: Normal breath sounds.  Abdominal:     General: Bowel sounds are normal.     Palpations: Abdomen is soft.  Musculoskeletal:        General: No tenderness.     Cervical back: Normal range of motion and neck supple.  Skin:    General: Skin is warm and dry.  Neurological:     Mental Status: She is alert and oriented to person, place, and time.    Labs reviewed: Basic Metabolic Panel: Recent Labs    10/29/19 2336 10/30/19 0552 11/01/19 0403 11/02/19 0521 02/11/20 2536 02/11/20 6440 02/12/20 0332 02/12/20 0332 02/13/20 0504 02/13/20 0504 02/14/20 0714 03/06/20 1603 05/22/20 0248 05/23/20 0245 09/04/20 1202  NA  --    < > 132*   < > 140   < > 137   < > 138   < > 134*   < > 135 132* 135  K  --    < >  3.9   < > 3.7   < > 3.3*   < > 4.1   < > 3.9   < > 4.6 4.5 3.8  CL  --    < > 103   < > 112*   < > 107   < > 110   < > 104   < > 103 104 100  CO2  --    < > 19*   < > 20*   < > 21*   < > 21*   < > 21*    < > 21* 21* 24  GLUCOSE  --    < > 219*   < > 103*   < > 102*   < > 158*   < > 209*   < > 243* 199* 248*  BUN  --    < > 24*   < > 19   < > 19   < > 17   < > 25*   < > 47* 45* 36*  CREATININE  --    < > 1.37*   < > 1.40*   < > 1.60*   < > 1.59*   < > 1.70*   < > 2.29* 2.07* 1.86*  CALCIUM  --    < > 9.9   < > 9.6   < > 9.5   < > 9.2   < > 9.1   < > 9.3 9.0 10.1  MG 1.6*   < > 1.7  --  1.2*   < > 1.2*  --  1.4*  --  1.3*  --   --   --   --   PHOS 2.1*  --  1.8*  --  2.1*  --  2.3*  --   --   --   --   --   --   --   --   TSH 0.989  --   --   --   --   --   --   --   --   --   --   --   --   --   --    < > = values in this interval not displayed.   Liver Function Tests: Recent Labs    02/12/20 0332 02/12/20 0332 02/13/20 0504 05/21/20 0948 09/04/20 1202  AST 20   < > 20 20 16   ALT 15   < > 14 22 11   ALKPHOS 54  --  54 84  --   BILITOT 0.9   < > 0.7 0.4 0.5  PROT 5.5*   < > 5.0* 7.0 6.6  ALBUMIN 2.6*  --  2.2* 3.3*  --    < > = values in this interval not displayed.   No results for input(s): LIPASE, AMYLASE in the last 8760 hours. No results for input(s): AMMONIA in the last 8760 hours. CBC: Recent Labs    02/13/20 0504 02/13/20 0504 03/06/20 1603 05/12/20 1435 05/22/20 0248 05/22/20 1033 09/04/20 1202  WBC 6.2   < > 8.0   < > 11.4* 13.0* 8.9  NEUTROABS 3.3  --  4,208  --   --   --  5,474  HGB 10.1*   < > 9.4*   < > 9.0* 9.7* 11.8  HCT 30.0*   < > 28.5*   < > 26.4* 29.2* 35.1  MCV 90.9   < > 97.3   < > 94.6 95.7 93.1  PLT 220   < >  282   < > 210 223 231   < > = values in this interval not displayed.   Lipid Panel: Recent Labs    02/09/20 1242 09/04/20 1202  CHOL 242* 179  HDL 69 76  LDLCALC 153* 88  TRIG 98 63  CHOLHDL 3.5 2.4   TSH: Recent Labs    10/29/19 2336  TSH 0.989   A1C: Lab Results  Component Value Date   HGBA1C 11.2 (H) 09/04/2020     Assessment/Plan 1. Proteinuria, unspecified type -noted by nephrology who increased her losartan to 50  mg, will increase at this time.  2. Type II diabetes mellitus with renal manifestations, uncontrolled (HCC) -uncontrolled. -blood sugars in the 300s on review of glucometer, planning to get free style libra soon.  -will increase lantus to 34 units daily Continues on humalog 3 units with meals.  Plan for her to take her blood sugar fasting daily and record. We will review at her one week follow up. Encouraged dietary modifications to help with proper control of blood sugar.  Encouraged dietary compliance, routine foot care/monitoring and to keep up with diabetic eye exams through ophthalmology.  3. Essential hypertension, benign -pt did not take medication today but reports blood pressure is generally well controlled.  She is taking losartan and valsartan-hctz (which has not been on medication list at our office or at her nephrologist) will have her STOP valsartan-hctz- called pharmacy and made she there were no additional refills.  -to continue norvasc 10 mg daily at this since stopping valsartan-hctz  - to take blood pressure 1 hour after medication and record. Will review at appt in 1 week.  - losartan (COZAAR) 50 MG tablet; Take 1 tablet (50 mg total) by mouth daily.  Dispense: 90 tablet; Refill: 1  4. PAD (peripheral artery disease) ([place]) -continues on plavix and asa  5. Chronic kidney disease, stage 4 (severe) (HCC) -ongoing follow up with nephrology, overall renal function has been stable. Needing better glycemic control.   6. Type II diabetes mellitus with peripheral circulatory disorder (HCC) - LANTUS SOLOSTAR 100 UNIT/ML Solostar Pen; Inject 24 Units into the skin daily.  Dispense: 15 mL; Refill: 1  7. Constipation Straining to have a BM weekly.  Encouraged to take miralax 17 gms daily, may need to decrease to every other day if having loose stools.   Next appt: 1 week for telephone visit- she has limited transportation.  Carlos American. [place], [place] [phone]

## 2020-09-18 NOTE — Patient Instructions (Addendum)
To increase lantus to 24 units daily To start taking blood sugars FASTING every day  STOP valsartan-hctz Continue but at [facility] at 50 mg daily (to take 2 of the 25 mg tablets until you get a new prescription) START taking blood pressure every day 1 hour AFTER you have taken your medication   To start [place]- this is an OTC mild laxative To take every day, if you have diarrhea decrease to every other day Can take daily or every other day for results.   FOLLOW UP WITH Altheria Shadoan in 1 week via telephone visit

## 2020-09-22 ENCOUNTER — Ambulatory Visit: Payer: Self-pay | Admitting: Internal Medicine

## 2020-09-26 ENCOUNTER — Encounter: Payer: Self-pay | Admitting: Nurse Practitioner

## 2020-09-26 ENCOUNTER — Other Ambulatory Visit: Payer: Self-pay

## 2020-09-26 ENCOUNTER — Ambulatory Visit (INDEPENDENT_AMBULATORY_CARE_PROVIDER_SITE_OTHER): Payer: Medicare Other | Admitting: Nurse Practitioner

## 2020-09-26 ENCOUNTER — Telehealth: Payer: Self-pay

## 2020-09-26 DIAGNOSIS — IMO0002 Reserved for concepts with insufficient information to code with codable children: Secondary | ICD-10-CM

## 2020-09-26 DIAGNOSIS — E1129 Type 2 diabetes mellitus with other diabetic kidney complication: Secondary | ICD-10-CM | POA: Diagnosis not present

## 2020-09-26 DIAGNOSIS — I10 Essential (primary) hypertension: Secondary | ICD-10-CM

## 2020-09-26 DIAGNOSIS — E1165 Type 2 diabetes mellitus with hyperglycemia: Secondary | ICD-10-CM | POA: Diagnosis not present

## 2020-09-26 DIAGNOSIS — K5904 Chronic idiopathic constipation: Secondary | ICD-10-CM | POA: Diagnosis not present

## 2020-09-26 NOTE — Telephone Encounter (Signed)
Ms. ady, heimann are scheduled for a virtual visit with your provider today.    Just as we do with appointments in the office, we must obtain your consent to participate.  Your consent will be active for this visit and any virtual visit you may have with one of our providers in the next 365 days.    If you have a MyChart account, I can also send a copy of this consent to you electronically.  All virtual visits are billed to your insurance company just like a traditional visit in the office.  As this is a virtual visit, video technology does not allow for your provider to perform a traditional examination.  This may limit your provider's ability to fully assess your condition.  If your provider identifies any concerns that need to be evaluated in person or the need to arrange testing such as labs, EKG, etc, we will make arrangements to do so.    Although advances in technology are sophisticated, we cannot ensure that it will always work on either your end or our end.  If the connection with a video visit is poor, we may have to switch to a telephone visit.  With either a video or telephone visit, we are not always able to ensure that we have a secure connection.   I need to obtain your verbal consent now.   Are you willing to proceed with your visit today?   Monique Cabrera has provided verbal consent on 09/26/2020 for a virtual visit (video or telephone).   [name], [name], [name] 09/26/2020  9:16 AM

## 2020-09-26 NOTE — Progress Notes (Signed)
This service is provided via telemedicine  No vital signs collected/recorded due to the encounter was a telemedicine visit.   Location of patient (ex: home, work):  Home   Patient consents to a telephone visit: Yes, see telephone encounter dated 09/26/2020 with annual consent   Location of the provider (ex: office, home):  [facility], Office   Name of any referring provider:  [name], DO  Names of all persons participating in the telemedicine service and their role in the encounter:  S.Chrae B/CMA, Sherrie Mustache, NP, and Patient   Time spent on call:  10 min with medical assistant       Careteam: Patient Care Team: [name], DO as PCP - General (Geriatric Medicine) Wellington Hampshire, MD as PCP - Cardiology (Cardiology) Adrian Prows, MD as Consulting Physician (Cardiology) Rutherford Guys, MD as Consulting Physician (Ophthalmology) Justin Mend, MD as Attending Physician (Internal Medicine)  Advanced Directive information    Allergies  Allergen Reactions   Invokana [Canagliflozin] Itching, Swelling and Other (See Comments)    Vaginal itching, swelling and irritation   Jardiance [Empagliflozin] Itching, Swelling and Other (See Comments)    Vaginal itching and swelling   Tuberculin Ppd Other (See Comments)    Per records from [place]  Patient presents with   Follow-up    1 week follow-up. Telephone visit    [employer] does not cover freestyle libre      HPI: Patient is a 85 y.o. female for follow up.   DM- at last visit to increase lantus to 24 units.  FASTING 318, 229,215,194,164  Over the last week.  No low blood sugars  htn- taking losartan 50 mg, coreg 25 BID, norvasc 10 mg daily.  Unable to take blood pressure at home. Blood pressure machine was not working, [facility] said they would send her another one.   Constipation- using miralax a capful a day, reports it  took 3 days to work and now [facility] "just coming out" having to change under pants.   Review of Systems:  Review of Systems  Constitutional: Negative for chills, fever and weight loss.  HENT: Negative for tinnitus.   Respiratory: Negative for cough, sputum production and shortness of breath.   Cardiovascular: Negative for chest pain, palpitations and leg swelling.  Gastrointestinal: Positive for constipation. Negative for abdominal pain, diarrhea and heartburn.  Genitourinary: Negative for dysuria, frequency and urgency.  Musculoskeletal: Negative for back pain, joint pain and myalgias.  Skin: Negative.   Neurological: Positive for sensory change. Negative for dizziness and headaches.  Psychiatric/Behavioral: Positive for memory loss. Negative for depression. The patient does not have insomnia.     Past Medical History:  Diagnosis Date   Acute upper respiratory infections of unspecified site    Amputee 08/2019   Anemia    Anemia, unspecified    Arthritis    Atherosclerosis of native arteries of the extremities, unspecified    Chest pain, unspecified    Chronic kidney disease (CKD), stage II (mild)    [facility]   Diabetes mellitus without complication (HCC)    Type II   Diarrhea    Disorder of bone and cartilage, unspecified    Herpes zoster with other nervous system complications(053.19)    History of blood transfusion    Hypercalcemia    Hypertension    Hypertensive renal disease, benign    Nonspecific reaction to  tuberculin skin test without active tuberculosis(795.51)    Other and unspecified hyperlipidemia    PAD (peripheral artery disease) ([place])    Per records from [name]   Pain in joint, lower leg    Peripheral arterial disease ([place])    Postherpetic neuralgia    Proteinuria    Stroke ([place]) 01/2017   Type II or unspecified type diabetes mellitus with renal manifestations, uncontrolled(250.42)    Unspecified disorder of kidney and  ureter    Past Surgical History:  Procedure Laterality Date   ABDOMINAL AORTOGRAM W/LOWER EXTREMITY N/A 10/31/2019   Procedure: ABDOMINAL AORTOGRAM W/LOWER EXTREMITY;  Surgeon: Wellington Hampshire, MD;  Location: [place] [facility] LAB;  Service: Cardiovascular;  Laterality: N/A;   ABDOMINAL AORTOGRAM W/LOWER EXTREMITY Left 05/12/2020   Procedure: ABDOMINAL AORTOGRAM W/LOWER EXTREMITY;  Surgeon: Waynetta Sandy, MD;  Location: [place] CV LAB;  Service: Cardiovascular;  Laterality: Left;   AMPUTATION Right 11/02/2019   Procedure: AMPUTATION BELOW KNEE RIGHT;  Surgeon: Rosetta Posner, MD;  Location: [facility] OR;  Service: Vascular;  Laterality: Right;   COLONOSCOPY     x 2   ENDARTERECTOMY FEMORAL Left 05/21/2020   Procedure: LEFT COMMON FEMORAL ENDARTERECTOMY;  Surgeon: Rosetta Posner, MD;  Location: [facility] OR;  Service: Vascular;  Laterality: Left;   FEMORAL-POPLITEAL BYPASS GRAFT Left 05/21/2020   Procedure: LEFT FEMORAL TO BELOW KNEE POPLITEAL ARTERY BYPASS GRAFT;  Surgeon: Rosetta Posner, MD;  Location: [facility] OR;  Service: Vascular;  Laterality: Left;   hysterectomy     INCISION AND DRAINAGE Left 05/27/14   sebacous cyst, ear   PRP Left    Dr. Ricki Miller   removal of cyst from hand     removal of tumor from foot     TONSILLECTOMY     Social History:   reports that she has quit smoking. Her smoking use included cigarettes. She has never used smokeless tobacco. She reports that she does not drink alcohol and does not use drugs.  Family History  Problem Relation Age of Onset   Diabetes Mother    Diabetes Father    Diabetes Sister    Diabetes Sister     Medications: Patient's Medications  New Prescriptions   No medications on file  Previous Medications   AMLODIPINE (NORVASC) 10 MG TABLET    Take 10 mg by mouth daily.   ASPIRIN LOW DOSE 81 MG EC TABLET    Take 81 mg by mouth daily.   ATORVASTATIN (LIPITOR) 40 MG TABLET    Take 1 tablet (40 mg total) by mouth daily.    CARVEDILOL (COREG) 25 MG TABLET    TAKE 1 TABLET BY MOUTH TWICE A DAY WITH MEALS   CLOPIDOGREL (PLAVIX) 75 MG TABLET    TAKE 1 TABLET BY MOUTH EVERY DAY   FEEDING SUPPLEMENT, GLUCERNA SHAKE, (GLUCERNA SHAKE) LIQD    Take 237 mLs by mouth as needed.   INSULIN LISPRO (HUMALOG) 100 UNIT/ML INJECTION    Inject 0.03 mLs (3 Units total) into the skin 3 (three) times daily after meals.   INSULIN PEN NEEDLE (B-D ULTRAFINE III SHORT PEN) 31G X 8 MM MISC    Use to check blood sugar every day. Dx: 11.29; 11.65   LANTUS SOLOSTAR 100 UNIT/ML SOLOSTAR PEN    Inject 24 Units into the skin daily.   LOSARTAN (COZAAR) 50 MG TABLET    Take 1 tablet (50 mg total) by mouth daily.   MULTIPLE VITAMIN (MULTIVITAMIN WITH MINERALS) TABS TABLET  Take 1 tablet by mouth daily.   ONETOUCH VERIO TEST STRIP    USE TO TEST BLOOD SUGAR THREE TIMES DAILY. DX: E11.9  Modified Medications   No medications on file  Discontinued Medications   CONTINUOUS BLOOD GLUC RECEIVER (FREESTYLE LIBRE 14 DAY READER) DEVI    1 each by Does not apply route in the morning, at noon, in the evening, and at bedtime. E11.29, E11.65   CONTINUOUS BLOOD GLUC SENSOR (FREESTYLE LIBRE 14 DAY SENSOR) MISC    1 each by Does not apply route in the morning, at noon, in the evening, and at bedtime. E11.29, E11.65   FEEDING SUPPLEMENT, GLUCERNA SHAKE, (GLUCERNA SHAKE) LIQD    Take 237 mLs by mouth 3 (three) times daily between meals.    Physical Exam:  There were no vitals filed for this visit. There is no height or weight on file to calculate BMI. Wt Readings from Last 3 Encounters:  09/18/20 134 lb 6.4 oz (61 kg)  09/10/20 131 lb 8 oz (59.6 kg)  09/04/20 134 lb (60.8 kg)      Labs reviewed: Basic Metabolic Panel: Recent Labs    10/29/19 2336 10/30/19 0552 11/01/19 0403 11/02/19 0521 02/11/20 4696 02/11/20 2952 02/12/20 0332 02/12/20 0332 02/13/20 0504 02/13/20 0504 02/14/20 0714 03/06/20 1603 05/22/20 0248 05/23/20 0245 09/04/20 1202    NA  --    < > 132*   < > 140   < > 137   < > 138   < > 134*   < > 135 132* 135  K  --    < > 3.9   < > 3.7   < > 3.3*   < > 4.1   < > 3.9   < > 4.6 4.5 3.8  CL  --    < > 103   < > 112*   < > 107   < > 110   < > 104   < > 103 104 100  CO2  --    < > 19*   < > 20*   < > 21*   < > 21*   < > 21*   < > 21* 21* 24  GLUCOSE  --    < > 219*   < > 103*   < > 102*   < > 158*   < > 209*   < > 243* 199* 248*  BUN  --    < > 24*   < > 19   < > 19   < > 17   < > 25*   < > 47* 45* 36*  CREATININE  --    < > 1.37*   < > 1.40*   < > 1.60*   < > 1.59*   < > 1.70*   < > 2.29* 2.07* 1.86*  CALCIUM  --    < > 9.9   < > 9.6   < > 9.5   < > 9.2   < > 9.1   < > 9.3 9.0 10.1  MG 1.6*   < > 1.7  --  1.2*   < > 1.2*  --  1.4*  --  1.3*  --   --   --   --   PHOS 2.1*  --  1.8*  --  2.1*  --  2.3*  --   --   --   --   --   --   --   --  TSH 0.989  --   --   --   --   --   --   --   --   --   --   --   --   --   --    < > = values in this interval not displayed.   Liver Function Tests: Recent Labs    02/12/20 0332 02/12/20 0332 02/13/20 0504 05/21/20 0948 09/04/20 1202  AST 20   < > 20 20 16   ALT 15   < > 14 22 11   ALKPHOS 54  --  54 84  --   BILITOT 0.9   < > 0.7 0.4 0.5  PROT 5.5*   < > 5.0* 7.0 6.6  ALBUMIN 2.6*  --  2.2* 3.3*  --    < > = values in this interval not displayed.   No results for input(s): LIPASE, AMYLASE in the last 8760 hours. No results for input(s): AMMONIA in the last 8760 hours. CBC: Recent Labs    02/13/20 0504 02/13/20 0504 03/06/20 1603 05/12/20 1435 05/22/20 0248 05/22/20 1033 09/04/20 1202  WBC 6.2   < > 8.0   < > 11.4* 13.0* 8.9  NEUTROABS 3.3  --  4,208  --   --   --  5,474  HGB 10.1*   < > 9.4*   < > 9.0* 9.7* 11.8  HCT 30.0*   < > 28.5*   < > 26.4* 29.2* 35.1  MCV 90.9   < > 97.3   < > 94.6 95.7 93.1  PLT 220   < > 282   < > 210 223 231   < > = values in this interval not displayed.   Lipid Panel: Recent Labs    02/09/20 1242 09/04/20 1202  CHOL 242* 179   HDL 69 76  LDLCALC 153* 88  TRIG 98 63  CHOLHDL 3.5 2.4   TSH: Recent Labs    10/29/19 2336  TSH 0.989   A1C: Lab Results  Component Value Date   HGBA1C 11.2 (H) 09/04/2020     Assessment/Plan 1. Type II diabetes mellitus with renal manifestations, uncontrolled (HCC) - blood sugars have improved with increase in lantus to 24 units. Will continue with this dose. No low blood sugars noted. Will continue lantus 24 units daily and have her continue to monitor daily  2. Essential hypertension, benign -has changed medication without adverse effect. Unable to check bp at home. Plans to get BP cuff. Will have her follow up in office in 1 week   3. Chronic idiopathic constipation -improved but based on frequency she need to decrease to every other day which she plans to implement today.   Next appt: 1 week.  Carlos American. Harle Battiest  [facility] [phone]    Virtual Visit via telephone visit  I connected with patient on 09/26/20 at  9:30 AM EDT by telephone and verified that I am speaking with the correct person using two identifiers.  Location: Patient: home Provider: [place]   I discussed the limitations, risks, security and privacy concerns of performing an evaluation and management service by telephone and the availability of in person appointments. I also discussed with the patient that there may be a patient responsible charge related to this service. The patient expressed understanding and agreed to proceed.   I discussed the assessment and treatment plan with the patient. The patient was provided an opportunity to ask questions and all were  answered. The patient agreed with the plan and demonstrated an understanding of the instructions.   The patient was advised to call back or seek an in-person evaluation if the symptoms worsen or if the condition fails to improve as anticipated.  I provided 16 minutes of non-face-to-face time during this  encounter.  Carlos American. Harle Battiest Avs printed and mailed

## 2020-10-06 ENCOUNTER — Encounter: Payer: Self-pay | Admitting: Nurse Practitioner

## 2020-10-06 ENCOUNTER — Other Ambulatory Visit: Payer: Self-pay

## 2020-10-06 ENCOUNTER — Ambulatory Visit (INDEPENDENT_AMBULATORY_CARE_PROVIDER_SITE_OTHER): Payer: Medicare Other | Admitting: Nurse Practitioner

## 2020-10-06 VITALS — BP 150/80 | HR 72 | Temp 97.5°F | Resp 20 | Ht 65.0 in | Wt 138.0 lb

## 2020-10-06 DIAGNOSIS — N184 Chronic kidney disease, stage 4 (severe): Secondary | ICD-10-CM

## 2020-10-06 DIAGNOSIS — E1165 Type 2 diabetes mellitus with hyperglycemia: Secondary | ICD-10-CM

## 2020-10-06 DIAGNOSIS — I10 Essential (primary) hypertension: Secondary | ICD-10-CM

## 2020-10-06 DIAGNOSIS — K5904 Chronic idiopathic constipation: Secondary | ICD-10-CM | POA: Diagnosis not present

## 2020-10-06 DIAGNOSIS — E1129 Type 2 diabetes mellitus with other diabetic kidney complication: Secondary | ICD-10-CM

## 2020-10-06 DIAGNOSIS — E1151 Type 2 diabetes mellitus with diabetic peripheral angiopathy without gangrene: Secondary | ICD-10-CM | POA: Diagnosis not present

## 2020-10-06 DIAGNOSIS — IMO0002 Reserved for concepts with insufficient information to code with codable children: Secondary | ICD-10-CM

## 2020-10-06 MED ORDER — LOSARTAN POTASSIUM 100 MG PO TABS: 100.0000 mg | ORAL_TABLET | Freq: Every day | ORAL | 1 refills | Status: DC

## 2020-10-06 NOTE — Patient Instructions (Addendum)
To increase lantus to 26 units daily for elevated blood sugar.   To increase losartan to 100 mg by mouth daily -- can take 2 of the 50 mg tablets until complete.   To check blood pressure once you have been sitting about 1 hour  AFTER you have taken your blood pressure medication   To add fiber in your diet (benefiber, metamucil)

## 2020-10-06 NOTE — Progress Notes (Signed)
Careteam: Patient Care Team: [name], DO as PCP - General (Geriatric Medicine) Wellington Hampshire, MD as PCP - Cardiology (Cardiology) Adrian Prows, MD as Consulting Physician (Cardiology) Rutherford Guys, MD as Consulting Physician (Ophthalmology) Justin Mend, MD as Attending Physician (Internal Medicine)  PLACE OF SERVICE:  [place]  Advanced Directive information    Allergies  Allergen Reactions  . Invokana [Canagliflozin] Itching, Swelling and Other (See Comments)    Vaginal itching, swelling and irritation  . Jardiance [Empagliflozin] Itching, Swelling and Other (See Comments)    Vaginal itching and swelling  . Tuberculin Ppd Other (See Comments)    Per records from [facility] Complaint  Patient presents with  . Follow-up    1 Week Follow Up     HPI: Patient is a 85 y.o. female for follow up on blood pressure.   htn- now prescribed losartan 50 mg daily, coreg 25 mg BID and norvasc 10 mg daily. Blood pressure high today and elevated on home bp reading. Reports she has taking her medication this morning.   Blood sugar fasting 150-200 on lantus 24 units and  3 units when she eats meals. No low blood sugars.  Constipation- having more incontinence of bowel. So she stopped miralax, it took her 4 days to get back to normal. She was previously on fiber but stopped that.    Review of Systems:  Review of Systems  Constitutional: Negative for chills, fever and weight loss.  HENT: Negative for hearing loss and tinnitus.   Respiratory: Negative for cough, sputum production and shortness of breath.   Cardiovascular: Negative for chest pain, palpitations and leg swelling.  Gastrointestinal: Positive for constipation and diarrhea. Negative for abdominal pain and heartburn.  Genitourinary: Negative for dysuria, frequency and urgency.  Musculoskeletal: Negative for back pain, falls, joint pain and myalgias.  Skin: Negative.   Neurological: Negative  for dizziness and headaches.  Psychiatric/Behavioral: Positive for memory loss. Negative for depression. The patient does not have insomnia.     Past Medical History:  Diagnosis Date  . Acute upper respiratory infections of unspecified site   . Amputee 08/2019  . Anemia   . Anemia, unspecified   . Arthritis   . Atherosclerosis of native arteries of the extremities, unspecified   . Chest pain, unspecified   . Chronic kidney disease (CKD), stage II (mild)    [facility]  . Diabetes mellitus without complication ([place])    Type II  . Diarrhea   . Disorder of bone and cartilage, unspecified   . Herpes zoster with other nervous system complications(053.19)   . History of blood transfusion   . Hypercalcemia   . Hypertension   . Hypertensive renal disease, benign   . Nonspecific reaction to tuberculin skin test without active tuberculosis(795.51)   . Other and unspecified hyperlipidemia   . PAD (peripheral artery disease) ([place])    Per records from [facility]  . Pain in joint, lower leg   . Peripheral arterial disease ([place])   . Postherpetic neuralgia   . Proteinuria   . Stroke ([place]) 01/2017  . Type II or unspecified type diabetes mellitus with renal manifestations, uncontrolled(250.42)   . Unspecified disorder of kidney and ureter    Past Surgical History:  Procedure Laterality Date  . ABDOMINAL AORTOGRAM W/LOWER EXTREMITY N/A 10/31/2019   Procedure: ABDOMINAL AORTOGRAM W/LOWER EXTREMITY;  Surgeon: Wellington Hampshire, MD;  Location: [place] CV LAB;  Service: Cardiovascular;  Laterality: N/A;  . ABDOMINAL AORTOGRAM W/LOWER EXTREMITY Left 05/12/2020   Procedure: ABDOMINAL AORTOGRAM W/LOWER EXTREMITY;  Surgeon: Waynetta Sandy, MD;  Location: [place] CV LAB;  Service: Cardiovascular;  Laterality: Left;  . AMPUTATION Right 11/02/2019   Procedure: AMPUTATION BELOW KNEE RIGHT;  Surgeon: Rosetta Posner, MD;  Location: [place];  Service: Vascular;  Laterality: Right;  .  COLONOSCOPY     x 2  . ENDARTERECTOMY FEMORAL Left 05/21/2020   Procedure: LEFT COMMON FEMORAL ENDARTERECTOMY;  Surgeon: Rosetta Posner, MD;  Location: [place];  Service: Vascular;  Laterality: Left;  . FEMORAL-POPLITEAL BYPASS GRAFT Left 05/21/2020   Procedure: LEFT FEMORAL TO BELOW KNEE POPLITEAL ARTERY BYPASS GRAFT;  Surgeon: Rosetta Posner, MD;  Location: [facility] OR;  Service: Vascular;  Laterality: Left;  . hysterectomy    . INCISION AND DRAINAGE Left 05/27/14   sebacous cyst, ear  . PRP Left    Dr. Ricki Miller  . removal of cyst from hand    . removal of tumor from foot    . TONSILLECTOMY     Social History:   reports that she has quit smoking. Her smoking use included cigarettes. She has never used smokeless tobacco. She reports that she does not drink alcohol and does not use drugs.  Family History  Problem Relation Age of Onset  . Diabetes Mother   . Diabetes Father   . Diabetes Sister   . Diabetes Sister     Medications: Patient's Medications  New Prescriptions   No medications on file  Previous Medications   AMLODIPINE (NORVASC) 10 MG TABLET    Take 10 mg by mouth daily.   ASPIRIN LOW DOSE 81 MG EC TABLET    Take 81 mg by mouth daily.   ATORVASTATIN (LIPITOR) 40 MG TABLET    Take 1 tablet (40 mg total) by mouth daily.   CARVEDILOL (COREG) 25 MG TABLET    TAKE 1 TABLET BY MOUTH TWICE A DAY WITH MEALS   CLOPIDOGREL (PLAVIX) 75 MG TABLET    TAKE 1 TABLET BY MOUTH EVERY DAY   FEEDING SUPPLEMENT, GLUCERNA SHAKE, (GLUCERNA SHAKE) LIQD    Take 237 mLs by mouth as needed.   INSULIN LISPRO (HUMALOG) 100 UNIT/ML INJECTION    Inject 0.03 mLs (3 Units total) into the skin 3 (three) times daily after meals.   INSULIN PEN NEEDLE (B-D ULTRAFINE III SHORT PEN) 31G X 8 MM MISC    Use to check blood sugar every day. Dx: 11.29; 11.65   LANTUS SOLOSTAR 100 UNIT/ML SOLOSTAR PEN    Inject 24 Units into the skin daily.   LOSARTAN (COZAAR) 50 MG TABLET    Take 1 tablet (50 mg total) by mouth daily.    MULTIPLE VITAMIN (MULTIVITAMIN WITH MINERALS) TABS TABLET    Take 1 tablet by mouth daily.   ONETOUCH VERIO TEST STRIP    USE TO TEST BLOOD SUGAR THREE TIMES DAILY. DX: E11.9  Modified Medications   No medications on file  Discontinued Medications   No medications on file    Physical Exam:  Vitals:   10/06/20 1147  BP: (!) 150/80  Pulse: 72  Resp: 20  Temp: (!) 97.5 F (36.4 C)  TempSrc: Temporal  SpO2: 96%  Weight: 138 lb (62.6 kg)  Height: 5\' 5"  (1.651 m)   Body mass index is 22.96 kg/m. Wt Readings from Last 3 Encounters:  10/06/20 138 lb (62.6 kg)  09/18/20 134 lb 6.4 oz (61 kg)  09/10/20 131 lb 8 oz (59.6 kg)    Physical Exam Constitutional:      General: She is not in acute distress.    Appearance: She is well-developed. She is not diaphoretic.  HENT:     Head: Normocephalic and atraumatic.     Mouth/Throat:     Pharynx: No oropharyngeal exudate.  Eyes:     Conjunctiva/sclera: Conjunctivae normal.     Pupils: Pupils are equal, round, and reactive to light.  Cardiovascular:     Rate and Rhythm: Normal rate and regular rhythm.     Heart sounds: Normal heart sounds.  Pulmonary:     Effort: Pulmonary effort is normal.     Breath sounds: Normal breath sounds.  Abdominal:     General: Bowel sounds are normal.     Palpations: Abdomen is soft.  Musculoskeletal:        General: No tenderness.     Cervical back: Normal range of motion and neck supple.     Left lower leg: Edema (1+) present.  Skin:    General: Skin is warm and dry.  Neurological:     Mental Status: She is alert and oriented to person, place, and time.     Gait: Gait abnormal (uses cane, slight limp due to right BKA).     Labs reviewed: Basic Metabolic Panel: Recent Labs    10/29/19 2336 10/30/19 0552 11/01/19 0403 11/02/19 0521 02/11/20 1448 02/11/20 1856 02/12/20 0332 02/12/20 0332 02/13/20 0504 02/13/20 0504 02/14/20 0714 03/06/20 1603 05/22/20 0248 05/23/20 0245  09/04/20 1202  NA  --    < > 132*   < > 140   < > 137   < > 138   < > 134*   < > 135 132* 135  K  --    < > 3.9   < > 3.7   < > 3.3*   < > 4.1   < > 3.9   < > 4.6 4.5 3.8  CL  --    < > 103   < > 112*   < > 107   < > 110   < > 104   < > 103 104 100  CO2  --    < > 19*   < > 20*   < > 21*   < > 21*   < > 21*   < > 21* 21* 24  GLUCOSE  --    < > 219*   < > 103*   < > 102*   < > 158*   < > 209*   < > 243* 199* 248*  BUN  --    < > 24*   < > 19   < > 19   < > 17   < > 25*   < > 47* 45* 36*  CREATININE  --    < > 1.37*   < > 1.40*   < > 1.60*   < > 1.59*   < > 1.70*   < > 2.29* 2.07* 1.86*  CALCIUM  --    < > 9.9   < > 9.6   < > 9.5   < > 9.2   < > 9.1   < > 9.3 9.0 10.1  MG 1.6*   < > 1.7  --  1.2*   < > 1.2*  --  1.4*  --  1.3*  --   --   --   --  PHOS 2.1*  --  1.8*  --  2.1*  --  2.3*  --   --   --   --   --   --   --   --   TSH 0.989  --   --   --   --   --   --   --   --   --   --   --   --   --   --    < > = values in this interval not displayed.   Liver Function Tests: Recent Labs    02/12/20 0332 02/12/20 0332 02/13/20 0504 05/21/20 0948 09/04/20 1202  AST 20   < > 20 20 16   ALT 15   < > 14 22 11   ALKPHOS 54  --  54 84  --   BILITOT 0.9   < > 0.7 0.4 0.5  PROT 5.5*   < > 5.0* 7.0 6.6  ALBUMIN 2.6*  --  2.2* 3.3*  --    < > = values in this interval not displayed.   No results for input(s): LIPASE, AMYLASE in the last 8760 hours. No results for input(s): AMMONIA in the last 8760 hours. CBC: Recent Labs    02/13/20 0504 02/13/20 0504 03/06/20 1603 05/12/20 1435 05/22/20 0248 05/22/20 1033 09/04/20 1202  WBC 6.2   < > 8.0   < > 11.4* 13.0* 8.9  NEUTROABS 3.3  --  4,208  --   --   --  5,474  HGB 10.1*   < > 9.4*   < > 9.0* 9.7* 11.8  HCT 30.0*   < > 28.5*   < > 26.4* 29.2* 35.1  MCV 90.9   < > 97.3   < > 94.6 95.7 93.1  PLT 220   < > 282   < > 210 223 231   < > = values in this interval not displayed.   Lipid Panel: Recent Labs    02/09/20 1242 09/04/20 1202   CHOL 242* 179  HDL 69 76  LDLCALC 153* 88  TRIG 98 63  CHOLHDL 3.5 2.4   TSH: Recent Labs    10/29/19 2336  TSH 0.989   A1C: Lab Results  Component Value Date   HGBA1C 11.2 (H) 09/04/2020     Assessment/Plan 1. Type II diabetes mellitus with renal manifestations, uncontrolled (HCC) Continues to have elevated fasting blood sugars. Will increase lantus to 26 units at this time and encouraged dietary compliance.  No hypoglycemia -continues on humalog 3 units with meals.  - BASIC METABOLIC PANEL WITH GFR  2. Essential hypertension, benign Worse today,, will increase losartan to 100 mg daily, to continue to work on dietary modifications with medication management.  - BASIC METABOLIC PANEL WITH GFR - losartan (COZAAR) 100 MG tablet; Take 1 tablet (100 mg total) by mouth daily.  Dispense: 30 tablet; Refill: 1  3. Chronic idiopathic constipation miralax caused diarrhea, encouraged to cut back to every other day or every 3rd day, can also use metamucil or benafiber.   4. Chronic kidney disease, stage 4 (severe) ([place]) Ongoing follow up with nephrologist -Encourage proper hydration and to avoid NSAIDS (Aleve, Advil, Motrin, Ibuprofen)  - BASIC METABOLIC PANEL WITH GFR  Next appt:  4 weeks  Jaevin Medearis K. [place], [facility] [phone]

## 2020-10-07 LAB — BASIC METABOLIC PANEL WITH GFR
BUN/Creatinine Ratio: 16 (calc) (ref 6–22)
BUN: 36 mg/dL — ABNORMAL HIGH (ref 7–25)
CO2: 24 mmol/L (ref 20–32)
Calcium: 9.7 mg/dL (ref 8.6–10.4)
Chloride: 106 mmol/L (ref 98–110)
Creat: 2.32 mg/dL — ABNORMAL HIGH (ref 0.60–0.88)
GFR, Est African American: 22 mL/min/{1.73_m2} — ABNORMAL LOW (ref >=60)
GFR, Est Non African American: 19 mL/min/{1.73_m2} — ABNORMAL LOW (ref >=60)
Glucose, Bld: 270 mg/dL — ABNORMAL HIGH (ref 65–139)
Potassium: 4.2 mmol/L (ref 3.5–5.3)
Sodium: 137 mmol/L (ref 135–146)

## 2020-10-07 MED ORDER — LANTUS SOLOSTAR 100 UNIT/ML ~~LOC~~ SOPN: 26.0000 [IU] | PEN_INJECTOR | Freq: Every day | SUBCUTANEOUS | 1 refills | Status: DC

## 2020-10-09 NOTE — Telephone Encounter (Signed)
I tried to call the patient to scheduale an AWV her last one was done in feb of 2020. Line was busy so I was unable to reach

## 2020-10-19 ENCOUNTER — Other Ambulatory Visit: Payer: Self-pay | Admitting: Nurse Practitioner

## 2020-10-19 DIAGNOSIS — I10 Essential (primary) hypertension: Secondary | ICD-10-CM

## 2020-10-22 ENCOUNTER — Ambulatory Visit: Payer: Medicare Other | Admitting: Nurse Practitioner

## 2020-11-11 ENCOUNTER — Other Ambulatory Visit: Payer: Self-pay

## 2020-11-11 ENCOUNTER — Ambulatory Visit (INDEPENDENT_AMBULATORY_CARE_PROVIDER_SITE_OTHER): Payer: Medicare Other | Admitting: Podiatry

## 2020-11-11 ENCOUNTER — Encounter: Payer: Self-pay | Admitting: Podiatry

## 2020-11-11 DIAGNOSIS — B351 Tinea unguium: Secondary | ICD-10-CM

## 2020-11-11 DIAGNOSIS — E1151 Type 2 diabetes mellitus with diabetic peripheral angiopathy without gangrene: Secondary | ICD-10-CM

## 2020-11-11 DIAGNOSIS — Z89511 Acquired absence of right leg below knee: Secondary | ICD-10-CM | POA: Diagnosis not present

## 2020-11-11 DIAGNOSIS — M2012 Hallux valgus (acquired), left foot: Secondary | ICD-10-CM

## 2020-11-11 DIAGNOSIS — E119 Type 2 diabetes mellitus without complications: Secondary | ICD-10-CM

## 2020-11-14 NOTE — Progress Notes (Signed)
ANNUAL DIABETIC FOOT EXAM  Subjective: Monique Cabrera presents today for for annual diabetic foot examination, at risk foot care. Patient has h/o amputation of below knee amputation right lower extremity and painful thick toenails that are difficult to trim.   Patient relates 30 year h/o diabetes.  Patient relates h/o foot wound in 2020 which resulted in below knee amputation of RLE. She has healed and wears a BK prosthesis of the  RLE.  She states her left foot feels like she has a cushion under the ball of the foot. Also relates her left great toe is really long. She states her left 2nd toe healed well from last visit.  Patient relates symptoms of foot numbness.  Reed, Tiffany L, DO is patient's PCP. Last visit was 09/04/2020.  Past Medical History:  Diagnosis Date  . Acute upper respiratory infections of unspecified site   . Amputee 08/2019  . Anemia   . Anemia, unspecified   . Arthritis   . Atherosclerosis of native arteries of the extremities, unspecified   . Chest pain, unspecified   . Chronic kidney disease (CKD), stage II (mild)    [facility]  . Diabetes mellitus without complication ([place])    Type II  . Diarrhea   . Disorder of bone and cartilage, unspecified   . Herpes zoster with other nervous system complications(053.19)   . History of blood transfusion   . Hypercalcemia   . Hypertension   . Hypertensive renal disease, benign   . Nonspecific reaction to tuberculin skin test without active tuberculosis(795.51)   . Other and unspecified hyperlipidemia   . PAD (peripheral artery disease) ([place])    Per records from [facility]  . Pain in joint, lower leg   . Peripheral arterial disease ([place])   . Postherpetic neuralgia   . Proteinuria   . Stroke ([place]) 01/2017  . Type II or unspecified type diabetes mellitus with renal manifestations, uncontrolled(250.42)   . Unspecified disorder of kidney and ureter    Patient Active Problem List   Diagnosis Date Noted  .  PAD (peripheral artery disease) ([place]) 05/21/2020  . Acute metabolic encephalopathy 83/50/5397  . COVID-19 virus infection 02/08/2020  . Acute kidney injury superimposed on CKD ([place]) 02/08/2020  . Status post below-knee amputation of right lower extremity ([place]) 11/07/2019  . Gangrene of right foot ([place])   . Hyponatremia 10/29/2019  . Chronic diastolic CHF (congestive heart failure) ([place]) 10/29/2019  . Stable proliferative diabetic retinopathy of both eyes associated with type 2 diabetes mellitus ([place]) 06/05/2018  . Acute CVA (cerebrovascular accident) ([place]) 02/18/2017  . Acute ischemic stroke ([place])   . Internuclear ophthalmoplegia of left eye   . Benign paroxysmal positional vertigo   . Abnormality of gait   . Acute onset of severe vertigo 02/17/2017  . Vertigo 02/17/2017  . Atherosclerosis of native artery of extremity with intermittent claudication ([place]) 03/11/2014  . Diabetic retinopathy ([place]) 03/11/2014  . Retinal hemorrhage due to secondary diabetes ([place]) 03/11/2014  . Type II diabetes mellitus with renal manifestations, uncontrolled ([place]) 03/11/2014  . Chronic hepatitis C without hepatic coma ([place]) 03/11/2014  . Hyperlipidemia 03/11/2014  . Hypoglycemia 04/16/2013  . Disorder of bone and cartilage, unspecified   . Other and unspecified hyperlipidemia   . Essential hypertension, benign   . Atherosclerosis of native artery of extremity ([place])   . Chronic kidney disease (CKD), stage II (mild)   . Peripheral arterial disease ([place])   . Anemia   . Postherpetic neuralgia   .  Hypertension    Past Surgical History:  Procedure Laterality Date  . ABDOMINAL AORTOGRAM W/LOWER EXTREMITY N/A 10/31/2019   Procedure: ABDOMINAL AORTOGRAM W/LOWER EXTREMITY;  Surgeon: Wellington Hampshire, MD;  Location: [facility];  Service: Cardiovascular;  Laterality: N/A;  . ABDOMINAL AORTOGRAM W/LOWER EXTREMITY Left 05/12/2020   Procedure: ABDOMINAL AORTOGRAM W/LOWER EXTREMITY;  Surgeon: Waynetta Sandy, MD;  Location: [facility];  Service: Cardiovascular;  Laterality: Left;  . AMPUTATION Right 11/02/2019   Procedure: AMPUTATION BELOW KNEE RIGHT;  Surgeon: Rosetta Posner, MD;  Location: [place] [facility];  Service: Vascular;  Laterality: Right;  . COLONOSCOPY     x 2  . ENDARTERECTOMY FEMORAL Left 05/21/2020   Procedure: LEFT COMMON FEMORAL ENDARTERECTOMY;  Surgeon: Rosetta Posner, MD;  Location: [facility];  Service: Vascular;  Laterality: Left;  . FEMORAL-POPLITEAL BYPASS GRAFT Left 05/21/2020   Procedure: LEFT FEMORAL TO BELOW KNEE POPLITEAL ARTERY BYPASS GRAFT;  Surgeon: Rosetta Posner, MD;  [facility];  Service: Vascular;  Laterality: Left;  . hysterectomy    . INCISION AND DRAINAGE Left 05/27/14   sebacous cyst, ear  . PRP Left    Dr. Ricki Miller  . removal of cyst from hand    . removal of tumor from foot    . TONSILLECTOMY     Current Outpatient Medications on File Prior to Visit  Medication Sig Dispense Refill  . amLODipine (NORVASC) 10 MG tablet Take 10 mg by mouth daily.    . ASPIRIN LOW DOSE 81 MG EC tablet Take 81 mg by mouth daily.    Marland Kitchen atorvastatin (LIPITOR) 40 MG tablet Take 1 tablet (40 mg total) by mouth daily. 90 tablet 1  . carvedilol (COREG) 25 MG tablet TAKE 1 TABLET BY MOUTH TWICE A DAY WITH MEALS 180 tablet 1  . clopidogrel (PLAVIX) 75 MG tablet TAKE 1 TABLET BY MOUTH EVERY DAY 90 tablet 1  . feeding supplement, GLUCERNA SHAKE, (GLUCERNA SHAKE) LIQD Take 237 mLs by mouth as needed.    . insulin lispro (HUMALOG) 100 UNIT/ML injection Inject 0.03 mLs (3 Units total) into the skin 3 (three) times daily after meals. 10 mL 11  . Insulin Pen Needle (B-D ULTRAFINE III SHORT PEN) 31G X 8 MM MISC Use to check blood sugar every day. Dx: 11.29; 11.65 100 each 1  . LANTUS SOLOSTAR 100 UNIT/ML Solostar Pen Inject 26 Units into the skin daily. 15 mL 1  . losartan (COZAAR) 100 MG tablet TAKE 1 TABLET BY MOUTH EVERY DAY 90 tablet 1  . Multiple Vitamin (MULTIVITAMIN WITH  MINERALS) TABS tablet Take 1 tablet by mouth daily.    Glory Rosebush VERIO test strip USE TO TEST BLOOD SUGAR THREE TIMES DAILY. DX: E11.9 300 strip 3   No current facility-administered medications on file prior to visit.    Allergies  Allergen Reactions  . Invokana [Canagliflozin] Itching, Swelling and Other (See Comments)    Vaginal itching, swelling and irritation  . Jardiance [Empagliflozin] Itching, Swelling and Other (See Comments)    Vaginal itching and swelling  . Tuberculin Ppd Other (See Comments)    Per records from [facility] History   Occupational History    Comment: retired, [employer]  Tobacco Use  . Smoking status: Former Smoker    Types: Cigarettes  . Smokeless tobacco: Never Used  . Tobacco comment: Quit about age 3  " it was really a habit"  Vaping Use  . Vaping Use:  Never used  Substance and Sexual Activity  . Alcohol use: No    Alcohol/week: 0.0 standard drinks  . Drug use: No  . Sexual activity: Never   Family History  Problem Relation Age of Onset  . Diabetes Mother   . Diabetes Father   . Diabetes Sister   . Diabetes Sister    Immunization History  Administered Date(s) Administered  . Fluad Quad(high Dose 65+) 08/23/2019, 09/04/2020  . Influenza, High Dose Seasonal PF 09/30/2018  . Influenza,inj,Quad PF,6+ Mos 09/24/2013, 09/19/2014, 09/13/2016  . Influenza-Unspecified 11/22/2009, 10/01/2011, 09/10/2015, 09/26/2017  . PFIZER SARS-COV-2 Vaccination 03/08/2020, 03/31/2020  . PPD Test 01/12/2010  . Pneumococcal Conjugate-13 11/24/2015  . Pneumococcal Polysaccharide-23 12/27/2005     Review of Systems: Negative except as noted in the HPI.  Objective: There were no vitals filed for this visit.  GRAHAM DOUKAS is a pleasant 85 y.o. African American female WD, WN in NAD. AAO X 3.  Vascular Examination: Capillary fill time to digits <3 seconds left lower extremity. Nonpalpable DP pulse(s) left lower extremity. Nonpalpable PT pulse(s) left  lower extremity. Pedal hair absent. Lower extremity skin temperature gradient WNL LLE. No pain with calf compression LLE. No ischemia or gangrene noted left lower extremity.  Dermatological Examination: Pedal skin is thin shiny, atrophic left lower extremity. No open wounds bilaterally. Toenails L hallux elongated, discolored, dystrophic, thickened, and crumbly with subungual debris and tenderness to dorsal palpation.         Musculoskeletal Examination: Muscle strength 5/5 to all LE muscle groups of left lower extremity. Hallux valgus with bunion deformity noted left lower extremity. Lower extremity amputation(s): below knee amputation right lower extremity. Utilizes rollator for ambulation assistance.  Footwear Assessment: Does the patient wear appropriate shoes? Yes. Does the patient need inserts/orthotics? Yes  Neurological Examination: Protective sensation diminished with 10g monofilament b/l. Vibratory sensation diminished b/l. Clonus negative b/l.  Hemoglobin A1C Latest Ref Rng & Units 09/04/2020 05/21/2020 02/09/2020  HGBA1C <5.7 % of total Hgb 11.2(H) 8.3(H) 11.0(H)  Some recent data might be hidden   Assessment: 1. Onychomycosis   2. Hallux valgus (acquired), left foot   3. Status post below-knee amputation of right lower extremity ([place])   4. Type II diabetes mellitus with peripheral circulatory disorder (HCC)   5. Encounter for diabetic foot exam ([place])     ADA Risk Categorization: High Risk  Patient has one or more of the following: Loss of protective sensation Absent pedal pulses Severe Foot deformity History of foot ulcer  Plan: -Examined patient. -Diabetic foot examination performed on today's visit. -Patient to continue soft, supportive shoe gear daily. Start procedure for diabetic shoes. Patient qualifies based on diagnoses. -Toenails L hallux debrided in length and girth without iatrogenic bleeding with sterile nail nipper and dremel.  -Patient to report any  pedal injuries to medical professional immediately. -Patient/POA to call should there be question/concern in the interim.  Return in about 3 months (around 02/11/2021).  Marzetta Board, DPM

## 2020-11-24 ENCOUNTER — Other Ambulatory Visit: Payer: Medicare Other | Admitting: Orthotics

## 2020-12-02 DIAGNOSIS — E119 Type 2 diabetes mellitus without complications: Secondary | ICD-10-CM | POA: Diagnosis not present

## 2020-12-02 DIAGNOSIS — E1122 Type 2 diabetes mellitus with diabetic chronic kidney disease: Secondary | ICD-10-CM | POA: Diagnosis not present

## 2020-12-02 DIAGNOSIS — D631 Anemia in chronic kidney disease: Secondary | ICD-10-CM | POA: Diagnosis not present

## 2020-12-02 DIAGNOSIS — N1832 Chronic kidney disease, stage 3b: Secondary | ICD-10-CM | POA: Diagnosis not present

## 2020-12-02 DIAGNOSIS — D649 Anemia, unspecified: Secondary | ICD-10-CM | POA: Diagnosis not present

## 2020-12-02 DIAGNOSIS — I129 Hypertensive chronic kidney disease with stage 1 through stage 4 chronic kidney disease, or unspecified chronic kidney disease: Secondary | ICD-10-CM | POA: Diagnosis not present

## 2020-12-02 DIAGNOSIS — R809 Proteinuria, unspecified: Secondary | ICD-10-CM | POA: Diagnosis not present

## 2020-12-15 ENCOUNTER — Other Ambulatory Visit (HOSPITAL_COMMUNITY): Payer: Self-pay | Admitting: *Deleted

## 2020-12-16 ENCOUNTER — Other Ambulatory Visit: Payer: Self-pay

## 2020-12-16 ENCOUNTER — Encounter (HOSPITAL_COMMUNITY)
Admission: RE | Admit: 2020-12-16 | Discharge: 2020-12-16 | Disposition: A | Discharge status: Home / Self Care | Payer: Medicare Other | Source: Ambulatory Visit | Attending: Internal Medicine | Admitting: Internal Medicine

## 2020-12-16 DIAGNOSIS — D631 Anemia in chronic kidney disease: Secondary | ICD-10-CM | POA: Diagnosis not present

## 2020-12-16 DIAGNOSIS — N183 Chronic kidney disease, stage 3 unspecified: Secondary | ICD-10-CM | POA: Diagnosis not present

## 2020-12-16 MED ORDER — SODIUM CHLORIDE 0.9 % IV SOLN
510.0000 mg | INTRAVENOUS | Status: DC
  Administered 2020-12-16: 12:00:00 510 mg via INTRAVENOUS
  Filled 2020-12-16: qty 510

## 2020-12-16 NOTE — Discharge Instructions (Signed)

## 2020-12-23 ENCOUNTER — Encounter (HOSPITAL_COMMUNITY)
Admission: RE | Admit: 2020-12-23 | Discharge: 2020-12-23 | Disposition: A | Discharge status: Home / Self Care | Payer: Medicare Other | Source: Ambulatory Visit | Attending: Internal Medicine | Admitting: Internal Medicine

## 2020-12-23 ENCOUNTER — Other Ambulatory Visit: Payer: Self-pay

## 2020-12-23 DIAGNOSIS — D631 Anemia in chronic kidney disease: Secondary | ICD-10-CM | POA: Diagnosis not present

## 2020-12-23 DIAGNOSIS — N183 Chronic kidney disease, stage 3 unspecified: Secondary | ICD-10-CM | POA: Diagnosis not present

## 2020-12-23 MED ORDER — SODIUM CHLORIDE 0.9 % IV SOLN
510.0000 mg | INTRAVENOUS | Status: DC
  Administered 2020-12-23: 13:00:00 510 mg via INTRAVENOUS
  Filled 2020-12-23: qty 17

## 2021-01-01 DIAGNOSIS — R350 Frequency of micturition: Secondary | ICD-10-CM | POA: Diagnosis not present

## 2021-01-01 DIAGNOSIS — D649 Anemia, unspecified: Secondary | ICD-10-CM | POA: Diagnosis not present

## 2021-01-01 DIAGNOSIS — I129 Hypertensive chronic kidney disease with stage 1 through stage 4 chronic kidney disease, or unspecified chronic kidney disease: Secondary | ICD-10-CM | POA: Diagnosis not present

## 2021-01-01 DIAGNOSIS — E1122 Type 2 diabetes mellitus with diabetic chronic kidney disease: Secondary | ICD-10-CM | POA: Diagnosis not present

## 2021-01-01 DIAGNOSIS — N1832 Chronic kidney disease, stage 3b: Secondary | ICD-10-CM | POA: Diagnosis not present

## 2021-01-01 DIAGNOSIS — R809 Proteinuria, unspecified: Secondary | ICD-10-CM | POA: Diagnosis not present

## 2021-01-05 ENCOUNTER — Other Ambulatory Visit: Payer: Self-pay

## 2021-01-05 ENCOUNTER — Other Ambulatory Visit: Payer: Medicare Other

## 2021-01-05 DIAGNOSIS — E1165 Type 2 diabetes mellitus with hyperglycemia: Secondary | ICD-10-CM

## 2021-01-05 DIAGNOSIS — E11649 Type 2 diabetes mellitus with hypoglycemia without coma: Secondary | ICD-10-CM | POA: Diagnosis not present

## 2021-01-05 DIAGNOSIS — E1129 Type 2 diabetes mellitus with other diabetic kidney complication: Secondary | ICD-10-CM | POA: Diagnosis not present

## 2021-01-05 DIAGNOSIS — I10 Essential (primary) hypertension: Secondary | ICD-10-CM

## 2021-01-06 LAB — COMPLETE METABOLIC PANEL WITH GFR
AG Ratio: 1.2 (calc) (ref 1.0–2.5)
ALT: 13 U/L (ref 6–29)
AST: 16 U/L (ref 10–35)
Albumin: 2.8 g/dL — ABNORMAL LOW (ref 3.6–5.1)
Alkaline phosphatase (APISO): 69 U/L (ref 37–153)
BUN/Creatinine Ratio: 12 (calc) (ref 6–22)
BUN: 36 mg/dL — ABNORMAL HIGH (ref 7–25)
CO2: 24 mmol/L (ref 20–32)
Calcium: 9 mg/dL (ref 8.6–10.4)
Chloride: 108 mmol/L (ref 98–110)
Creat: 3.04 mg/dL — ABNORMAL HIGH (ref 0.60–0.88)
GFR, Est African American: 16 mL/min/{1.73_m2} — ABNORMAL LOW (ref >=60)
GFR, Est Non African American: 14 mL/min/{1.73_m2} — ABNORMAL LOW (ref >=60)
Globulin: 2.4 g/dL (calc) (ref 1.9–3.7)
Glucose, Bld: 184 mg/dL — ABNORMAL HIGH (ref 65–99)
Potassium: 3.9 mmol/L (ref 3.5–5.3)
Sodium: 138 mmol/L (ref 135–146)
Total Bilirubin: 0.3 mg/dL (ref 0.2–1.2)
Total Protein: 5.2 g/dL — ABNORMAL LOW (ref 6.1–8.1)

## 2021-01-06 LAB — CBC WITH DIFFERENTIAL/PLATELET
Absolute Monocytes: 770 cells/uL (ref 200–950)
Basophils Absolute: 49 cells/uL (ref 0–200)
Basophils Relative: 0.6 %
Eosinophils Absolute: 259 cells/uL (ref 15–500)
Eosinophils Relative: 3.2 %
HCT: 30 % — ABNORMAL LOW (ref 35.0–45.0)
Hemoglobin: 10.2 g/dL — ABNORMAL LOW (ref 11.7–15.5)
Lymphs Abs: 2041 cells/uL (ref 850–3900)
MCH: 31.4 pg (ref 27.0–33.0)
MCHC: 34 g/dL (ref 32.0–36.0)
MCV: 92.3 fL (ref 80.0–100.0)
MPV: 11 fL (ref 7.5–12.5)
Monocytes Relative: 9.5 %
Neutro Abs: 4982 cells/uL (ref 1500–7800)
Neutrophils Relative %: 61.5 %
Platelets: 228 10*3/uL (ref 140–400)
RBC: 3.25 10*6/uL — ABNORMAL LOW (ref 3.80–5.10)
RDW: 12.3 % (ref 11.0–15.0)
Total Lymphocyte: 25.2 %
WBC: 8.1 10*3/uL (ref 3.8–10.8)

## 2021-01-06 LAB — LIPID PANEL
Cholesterol: 167 mg/dL (ref <200)
HDL: 73 mg/dL (ref >=50)
LDL Cholesterol (Calc): 78 mg/dL (calc)
Non-HDL Cholesterol (Calc): 94 mg/dL (calc) (ref <130)
Total CHOL/HDL Ratio: 2.3 (calc) (ref <5.0)
Triglycerides: 77 mg/dL (ref <150)

## 2021-01-06 LAB — HEMOGLOBIN A1C
Hgb A1c MFr Bld: 9 % of total Hgb — ABNORMAL HIGH (ref <5.7)
Mean Plasma Glucose: 212 mg/dL
eAG (mmol/L): 11.7 mmol/L

## 2021-01-06 NOTE — Progress Notes (Signed)
Too much to discuss here.  We'll review at her appt 1/13.

## 2021-01-08 ENCOUNTER — Other Ambulatory Visit: Payer: Self-pay

## 2021-01-08 ENCOUNTER — Ambulatory Visit (INDEPENDENT_AMBULATORY_CARE_PROVIDER_SITE_OTHER): Payer: Medicare Other | Admitting: Internal Medicine

## 2021-01-08 VITALS — BP 142/80 | HR 75 | Temp 96.8°F | Ht 65.0 in | Wt 138.0 lb

## 2021-01-08 DIAGNOSIS — IMO0002 Reserved for concepts with insufficient information to code with codable children: Secondary | ICD-10-CM

## 2021-01-08 DIAGNOSIS — E119 Type 2 diabetes mellitus without complications: Secondary | ICD-10-CM | POA: Diagnosis not present

## 2021-01-08 DIAGNOSIS — E1165 Type 2 diabetes mellitus with hyperglycemia: Secondary | ICD-10-CM

## 2021-01-08 DIAGNOSIS — Z0100 Encounter for examination of eyes and vision without abnormal findings: Secondary | ICD-10-CM

## 2021-01-08 DIAGNOSIS — E113553 Type 2 diabetes mellitus with stable proliferative diabetic retinopathy, bilateral: Secondary | ICD-10-CM | POA: Diagnosis not present

## 2021-01-08 DIAGNOSIS — Z89511 Acquired absence of right leg below knee: Secondary | ICD-10-CM

## 2021-01-08 DIAGNOSIS — I5032 Chronic diastolic (congestive) heart failure: Secondary | ICD-10-CM | POA: Diagnosis not present

## 2021-01-08 DIAGNOSIS — I10 Essential (primary) hypertension: Secondary | ICD-10-CM

## 2021-01-08 DIAGNOSIS — E1129 Type 2 diabetes mellitus with other diabetic kidney complication: Secondary | ICD-10-CM | POA: Diagnosis not present

## 2021-01-08 DIAGNOSIS — N184 Chronic kidney disease, stage 4 (severe): Secondary | ICD-10-CM | POA: Diagnosis not present

## 2021-01-08 DIAGNOSIS — Z66 Do not resuscitate: Secondary | ICD-10-CM

## 2021-01-08 DIAGNOSIS — Z7689 Persons encountering health services in other specified circumstances: Secondary | ICD-10-CM | POA: Diagnosis not present

## 2021-01-08 MED ORDER — GLUCOSE 15 GM/32ML PO GEL: ORAL | 5 refills | Status: DC

## 2021-01-08 NOTE — Progress Notes (Signed)
Location:  [facility] clinic Provider:  Lea Baine L. Mariea Clonts, D.O., C.M.D.  Code Status: DNR Goals of Care:  Advanced Directives 01/08/2021  Does Patient Have a Medical Advance Directive? Yes  Type of Advance Directive Out of facility DNR (pink MOST or yellow form)  Does patient want to make changes to medical advance directive? No - Patient declined  Would patient like information on creating a medical advance directive? -  Pre-existing out of facility DNR order (yellow form or pink MOST form) -     Chief Complaint  Patient presents with  . Medical Management of Chronic Issues    4 month follow-up and review labs. Discuss need for eye exam and covid booster. Discuss DNR, patient is not sure if she really wants this document on file or not. Discuss lantus, patient did not increase to 26 units as instructed in October 2021, patient is on 24 units.     HPI: Patient is a 86 y.o. female seen today for medical management of chronic diseases.    She had seen Janett Billow re: her proteinuria and blood sugars in Sept and October.    I got a message from nephrology that she is getting further testing about her CKD.  She had studies about her anemia and iron studies.   Got ferraheme 12/21 and 12/28 thru Dr. Johnney Ou, nephrology.  Explained to her what it is.  She is also now on torsemide 20mg  daily (but 40mg  daily for 3 days now thru sat) then back to 20mg  daily due to increased edema of her left leg.    Says she didn't know Janett Billow recommended she increase her lantus (still taking just 24 units).  She never did it.  She had an episode of hypoglycemia where she just passed out a few weeks ago when talking to her grandson on the phone.  She started talking all crazy.  He called her one daughter and sent her over.  She could not get up to unlock the door.  She remembers her daughter knocking on the door, but she couldn't get up.  Her son in law was home and left her in.  EMS was called.  Had not had that in ages.     Walking more with her cane now when she's in places where she's familiar.    She still needs to find an eye doctor.  Eyes are a whole lot worse especially in her left eye.    Past Medical History:  Diagnosis Date  . Acute upper respiratory infections of unspecified site   . Amputee 08/2019  . Anemia   . Anemia, unspecified   . Arthritis   . Atherosclerosis of native arteries of the extremities, unspecified   . Chest pain, unspecified   . Chronic kidney disease (CKD), stage II (mild)    [facility]  . Diabetes mellitus without complication ([place])    Type II  . Diarrhea   . Disorder of bone and cartilage, unspecified   . Herpes zoster with other nervous system complications(053.19)   . History of blood transfusion   . Hypercalcemia   . Hypertension   . Hypertensive renal disease, benign   . Nonspecific reaction to tuberculin skin test without active tuberculosis(795.51)   . Other and unspecified hyperlipidemia   . PAD (peripheral artery disease) ([place])    Per records from [facility]  . Pain in joint, lower leg   . Peripheral arterial disease ([place])   . Postherpetic neuralgia   . Proteinuria   .  Stroke ([place]) 01/2017  . Type II or unspecified type diabetes mellitus with renal manifestations, uncontrolled(250.42)   . Unspecified disorder of kidney and ureter     Past Surgical History:  Procedure Laterality Date  . ABDOMINAL AORTOGRAM W/LOWER EXTREMITY N/A 10/31/2019   Procedure: ABDOMINAL AORTOGRAM W/LOWER EXTREMITY;  Surgeon: Wellington Hampshire, MD;  Location: [facility];  Service: Cardiovascular;  Laterality: N/A;  . ABDOMINAL AORTOGRAM W/LOWER EXTREMITY Left 05/12/2020   Procedure: ABDOMINAL AORTOGRAM W/LOWER EXTREMITY;  Surgeon: Waynetta Sandy, MD;  Location: [facility];  Service: Cardiovascular;  Laterality: Left;  . AMPUTATION Right 11/02/2019   Procedure: AMPUTATION BELOW KNEE RIGHT;  Surgeon: Rosetta Posner, MD;  Location: [facility];  Service:  Vascular;  Laterality: Right;  . COLONOSCOPY     x 2  . ENDARTERECTOMY FEMORAL Left 05/21/2020   Procedure: LEFT COMMON FEMORAL ENDARTERECTOMY;  Surgeon: Rosetta Posner, MD;  Location: [facility];  Service: Vascular;  Laterality: Left;  . FEMORAL-POPLITEAL BYPASS GRAFT Left 05/21/2020   Procedure: LEFT FEMORAL TO BELOW KNEE POPLITEAL ARTERY BYPASS GRAFT;  Surgeon: Rosetta Posner, MD;  [facility];  Service: Vascular;  Laterality: Left;  . hysterectomy    . INCISION AND DRAINAGE Left 05/27/14   sebacous cyst, ear  . PRP Left    Dr. Ricki Miller  . removal of cyst from hand    . removal of tumor from foot    . TONSILLECTOMY      Allergies  Allergen Reactions  . Invokana [Canagliflozin] Itching, Swelling and Other (See Comments)    Vaginal itching, swelling and irritation  . Jardiance [Empagliflozin] Itching, Swelling and Other (See Comments)    Vaginal itching and swelling  . Tuberculin Ppd Other (See Comments)    Per records from [facility] Encounter Medications as of 01/08/2021  Medication Sig  . amLODipine (NORVASC) 10 MG tablet Take 10 mg by mouth daily.  . ASPIRIN LOW DOSE 81 MG EC tablet Take 81 mg by mouth daily.  Marland Kitchen atorvastatin (LIPITOR) 40 MG tablet Take 1 tablet (40 mg total) by mouth daily.  . carvedilol (COREG) 25 MG tablet TAKE 1 TABLET BY MOUTH TWICE A DAY WITH MEALS  . clopidogrel (PLAVIX) 75 MG tablet TAKE 1 TABLET BY MOUTH EVERY DAY  . feeding supplement, GLUCERNA SHAKE, (GLUCERNA SHAKE) LIQD Take 237 mLs by mouth as needed.  . insulin lispro (HUMALOG) 100 UNIT/ML injection Inject 0.03 mLs (3 Units total) into the skin 3 (three) times daily after meals.  . Insulin Pen Needle (B-D ULTRAFINE III SHORT PEN) 31G X 8 MM MISC Use to check blood sugar every day. Dx: 11.29; 11.65  . losartan (COZAAR) 100 MG tablet TAKE 1 TABLET BY MOUTH EVERY DAY  . Multiple Vitamin (MULTIVITAMIN WITH MINERALS) TABS tablet Take 1 tablet by mouth daily.  Glory Rosebush VERIO test strip  USE TO TEST BLOOD SUGAR THREE TIMES DAILY. DX: E11.9  . LANTUS SOLOSTAR 100 UNIT/ML Solostar Pen Inject 26 Units into the skin daily. (Patient not taking: Reported on 01/08/2021)   No facility-administered encounter medications on file as of 01/08/2021.    Review of Systems:  Review of Systems  Constitutional: Negative for chills and fever.  HENT: Negative for congestion and sore throat.   Eyes: Positive for blurred vision.  Respiratory: Negative for cough and shortness of breath.   Cardiovascular: Negative for chest pain, palpitations and leg swelling.  Gastrointestinal: Negative for abdominal pain, blood in  stool, constipation, diarrhea and melena.  Genitourinary: Negative for dysuria.  Musculoskeletal: Negative for falls and joint pain.  Neurological: Positive for loss of consciousness. Negative for dizziness and seizures.  Endo/Heme/Allergies: Does not bruise/bleed easily.  Psychiatric/Behavioral: Positive for memory loss. Negative for depression. The patient is not nervous/anxious and does not have insomnia.     Health Maintenance  Topic Date Due  . OPHTHALMOLOGY EXAM  07/25/2020  . HEMOGLOBIN A1C  07/05/2021  . FOOT EXAM  11/11/2021  . INFLUENZA VACCINE  Completed  . DEXA SCAN  Completed  . COVID-19 Vaccine  Completed  . PNA vac Low Risk Adult  Completed    Physical Exam: Vitals:   01/08/21 1027  BP: (!) 142/80  Pulse: 75  Temp: (!) 96.8 F (36 C)  TempSrc: Temporal  SpO2: 99%  Weight: 138 lb (62.6 kg)  Height: 5\' 5"  (1.651 m)   Body mass index is 22.96 kg/m. Physical Exam Vitals reviewed.  Constitutional:      General: She is not in acute distress.    Appearance: Normal appearance. She is not toxic-appearing.  HENT:     Head: Normocephalic and atraumatic.  Eyes:     Comments: glasses  Cardiovascular:     Rate and Rhythm: Normal rate and regular rhythm.  Pulmonary:     Effort: Pulmonary effort is normal.     Breath sounds: Normal breath sounds. No  wheezing, rhonchi or rales.  Abdominal:     General: Bowel sounds are normal. There is no distension.     Tenderness: There is no abdominal tenderness. There is no guarding or rebound.  Musculoskeletal:     Left lower leg: Edema present.  Skin:    Capillary Refill: Capillary refill takes less than 2 seconds.  Neurological:     Mental Status: She is alert and oriented to person, place, and time.     Gait: Gait abnormal.     Comments: Using cane, BKA  Psychiatric:        Mood and Affect: Mood normal.        Behavior: Behavior normal.     Labs reviewed: Basic Metabolic Panel: Recent Labs    02/11/20 0657 02/12/20 0332 02/13/20 0504 02/14/20 0714 03/06/20 1603 09/04/20 1202 10/06/20 1213 01/05/21 1405  NA 140 137 138 134*   < > 135 137 138  K 3.7 3.3* 4.1 3.9   < > 3.8 4.2 3.9  CL 112* 107 110 104   < > 100 106 108  CO2 20* 21* 21* 21*   < > 24 24 24   GLUCOSE 103* 102* 158* 209*   < > 248* 270* 184*  BUN 19 19 17  25*   < > 36* 36* 36*  CREATININE 1.40* 1.60* 1.59* 1.70*   < > 1.86* 2.32* 3.04*  CALCIUM 9.6 9.5 9.2 9.1   < > 10.1 9.7 9.0  MG 1.2* 1.2* 1.4* 1.3*  --   --   --   --   PHOS 2.1* 2.3*  --   --   --   --   --   --    < > = values in this interval not displayed.   Liver Function Tests: Recent Labs    02/12/20 0332 02/13/20 0504 05/21/20 0948 09/04/20 1202 01/05/21 1405  AST 20 20 20 16 16   ALT 15 14 22 11 13   ALKPHOS 54 54 84  --   --   BILITOT 0.9 0.7 0.4 0.5 0.3  PROT 5.5* 5.0*  7.0 6.6 5.2*  ALBUMIN 2.6* 2.2* 3.3*  --   --    No results for input(s): LIPASE, AMYLASE in the last 8760 hours. No results for input(s): AMMONIA in the last 8760 hours. CBC: Recent Labs    03/06/20 1603 05/12/20 1435 05/22/20 1033 09/04/20 1202 01/05/21 1405  WBC 8.0   < > 13.0* 8.9 8.1  NEUTROABS 4,208  --   --  5,474 4,982  HGB 9.4*   < > 9.7* 11.8 10.2*  HCT 28.5*   < > 29.2* 35.1 30.0*  MCV 97.3   < > 95.7 93.1 92.3  PLT 282   < > 223 231 228   < > = values  in this interval not displayed.   Lipid Panel: Recent Labs    02/09/20 1242 09/04/20 1202 01/05/21 1405  CHOL 242* 179 167  HDL 69 76 73  LDLCALC 153* 88 78  TRIG 98 63 77  CHOLHDL 3.5 2.4 2.3   Lab Results  Component Value Date   HGBA1C 9.0 (H) 01/05/2021    Procedures since last visit: No results found.  Assessment/Plan 1. DNR (do not resuscitate) - Do not attempt resuscitation (DNR) reentered in system again after multiple prior discussions  2. Type II diabetes mellitus with renal manifestations, uncontrolled (HCC) - has workup for ckd ongoing due to worsening creatinine - CBC with Differential/Platelet; Future - Basic metabolic panel; Future - Hemoglobin A1c; Future - Lipid panel; Future -increase lantus to 26 units as recommended previously by NP [place] here -counseled on importance of regular meals  3. Chronic kidney disease, stage 4 (severe) (HCC) - followed by nephrology and progressive - Basic metabolic panel; Future  4. Essential hypertension, benign -bp slightly elevated today, if remains over 140 next time, will need adjustment - Basic metabolic panel; Future  5. Diabetic eye exam ([place]) - needs this especially since she notes decreased left eye acuity - Ambulatory referral to Ophthalmology - Hemoglobin A1c; Future  6. Status post below-knee amputation of right lower extremity ([place]) -remains true -fortunately no new skin ulcerations or breakdown on left leg though she's had some edema that is improving with doubled up torsemide by nephrology  7. Chronic diastolic (congestive) heart failure (HCC) -cont torsemide  8. Stable proliferative diabetic retinopathy of both eyes associated with type 2 diabetes mellitus ([place]) -needs ophtho f/u   Labs/tests ordered:  Lab Orders     CBC with Differential/Platelet     Basic metabolic panel     Hemoglobin A1c     Lipid panel  Next appt:  04/09/2021 with fasting labs before  [facility] L. Lakshya Mcgillicuddy,  D.O. [facility] [address], [place] [address] Cell Phone (Mon-Fri 8am-5pm):  [phone] On Call:  [phone] & follow prompts after 5pm & weekends Office Phone:  [phone] Office Fax:  [phone]

## 2021-01-08 NOTE — Patient Instructions (Signed)
Increase your lantus from 24 to 26 units.  Let me know if you have any lows.

## 2021-01-13 ENCOUNTER — Other Ambulatory Visit: Payer: Self-pay | Admitting: Internal Medicine

## 2021-01-13 DIAGNOSIS — N184 Chronic kidney disease, stage 4 (severe): Secondary | ICD-10-CM

## 2021-01-19 DIAGNOSIS — R809 Proteinuria, unspecified: Secondary | ICD-10-CM | POA: Diagnosis not present

## 2021-01-21 ENCOUNTER — Other Ambulatory Visit: Payer: Medicare Other

## 2021-01-30 ENCOUNTER — Other Ambulatory Visit: Payer: Self-pay

## 2021-01-30 ENCOUNTER — Telehealth: Payer: Self-pay

## 2021-01-30 MED ORDER — ATORVASTATIN CALCIUM 40 MG PO TABS
40.0000 mg | ORAL_TABLET | Freq: Every day | ORAL | 1 refills | Status: DC
Start: 2021-01-30 — End: 2021-06-10

## 2021-01-30 MED ORDER — AMLODIPINE BESYLATE 10 MG PO TABS
10.0000 mg | ORAL_TABLET | Freq: Every day | ORAL | 2 refills | Status: DC
Start: 2021-01-30 — End: 2021-10-19

## 2021-01-30 NOTE — Telephone Encounter (Signed)
She is to be on the two individual medications as she mentions, but not the caduet combination tablet

## 2021-01-30 NOTE — Telephone Encounter (Signed)
Monique Cabrera called and stated she is almost out of her Amlodipine 10 mg. She's also on atorvastatin 40 mg which are both on her med list, however she stated that her daughter picked up a prescription  and is not on her med list which the bottle that she read to me says  Amlodipine-atorvast 5-10 mg. She said she stated she not  taking the medication yet and needs your ok .  In looking at the reconcile list it is an order with your signature sent in on  01/24/21. Please advise.

## 2021-02-03 ENCOUNTER — Ambulatory Visit
Admission: RE | Admit: 2021-02-03 | Discharge: 2021-02-03 | Disposition: A | Discharge status: Home / Self Care | Payer: Medicare Other | Source: Ambulatory Visit | Attending: Internal Medicine | Admitting: Internal Medicine

## 2021-02-03 DIAGNOSIS — N281 Cyst of kidney, acquired: Secondary | ICD-10-CM | POA: Diagnosis not present

## 2021-02-03 DIAGNOSIS — Z7689 Persons encountering health services in other specified circumstances: Secondary | ICD-10-CM | POA: Diagnosis not present

## 2021-02-03 DIAGNOSIS — N189 Chronic kidney disease, unspecified: Secondary | ICD-10-CM | POA: Diagnosis not present

## 2021-02-03 DIAGNOSIS — N184 Chronic kidney disease, stage 4 (severe): Secondary | ICD-10-CM

## 2021-02-05 DIAGNOSIS — R809 Proteinuria, unspecified: Secondary | ICD-10-CM | POA: Diagnosis not present

## 2021-02-05 DIAGNOSIS — I129 Hypertensive chronic kidney disease with stage 1 through stage 4 chronic kidney disease, or unspecified chronic kidney disease: Secondary | ICD-10-CM | POA: Diagnosis not present

## 2021-02-05 DIAGNOSIS — D649 Anemia, unspecified: Secondary | ICD-10-CM | POA: Diagnosis not present

## 2021-02-05 DIAGNOSIS — E1122 Type 2 diabetes mellitus with diabetic chronic kidney disease: Secondary | ICD-10-CM | POA: Diagnosis not present

## 2021-02-05 DIAGNOSIS — N184 Chronic kidney disease, stage 4 (severe): Secondary | ICD-10-CM | POA: Diagnosis not present

## 2021-02-10 ENCOUNTER — Other Ambulatory Visit: Payer: Self-pay | Admitting: Internal Medicine

## 2021-02-10 DIAGNOSIS — E1151 Type 2 diabetes mellitus with diabetic peripheral angiopathy without gangrene: Secondary | ICD-10-CM

## 2021-02-10 DIAGNOSIS — I10 Essential (primary) hypertension: Secondary | ICD-10-CM

## 2021-02-10 MED ORDER — LANTUS SOLOSTAR 100 UNIT/ML ~~LOC~~ SOPN: 26.0000 [IU] | PEN_INJECTOR | Freq: Every day | SUBCUTANEOUS | 1 refills | Status: DC

## 2021-02-10 NOTE — Telephone Encounter (Signed)
Called patient and she understood and repeated what I stated. Patient states that she will pick up medication. Also called patient daughter "Cathrine Muster" but number was out of service.

## 2021-02-10 NOTE — Telephone Encounter (Signed)
Patient has request refill on medication "Losartan 100 mg". Medication is on backorder. Medication pend and sent to PCP Mariea Clonts, Tiffany L, DO . Please Advise.

## 2021-02-10 NOTE — Telephone Encounter (Signed)
[place].  That's going to create confusion again for her and also when she goes to her nephrologist and cardiologist.  Please call the pharmacy and find out which ARB medication and dose they recommend as a substitution that will be covered by her insurance.  Otherwise we could be dealing with this for weeks.

## 2021-02-10 NOTE — Telephone Encounter (Signed)
I called pharmacy and spoke to [place]. He states that Telmisartan/Olmesartan are options that are most likely covered by insurance. He states that all the Losartan and combinations are on backorder. Lastly he states that alternative medications may not come at 100 mg strength. Message routed back to PCP Reed, Blaine, DO . Please Advise.

## 2021-02-10 NOTE — Telephone Encounter (Signed)
Ok, I was hoping he'd be able to give me the equivalent dose in the other med.  I've sent in olmesartan 20mg  daily instead of the losartan 100mg  daily.  Please notify the patient about this b/c she will be very confused and does not take things often times until she and I specifically discuss them.

## 2021-02-16 ENCOUNTER — Encounter: Payer: Self-pay | Admitting: Internal Medicine

## 2021-02-16 DIAGNOSIS — Z7689 Persons encountering health services in other specified circumstances: Secondary | ICD-10-CM | POA: Diagnosis not present

## 2021-02-17 ENCOUNTER — Ambulatory Visit: Payer: Medicare Other | Admitting: Podiatry

## 2021-02-17 ENCOUNTER — Telehealth: Payer: Self-pay | Admitting: *Deleted

## 2021-02-17 NOTE — Telephone Encounter (Signed)
Patient called and stated that she picked up some medication at the pharmacy and she is not sure that she is suppose to be taking it. Stated that it was not discussed.  Wants to know if she should be taking Olmesartan 20mg  daily.  Stated that she was not aware of this medication and why she needs it.   Please Advise.

## 2021-02-17 NOTE — Telephone Encounter (Signed)
Exactly what I thought would happen.  I do believe she was called and notified that her losartan was on back order and the olmesartan was being prescribed instead--yes, she should take it--per me.

## 2021-02-18 NOTE — Telephone Encounter (Signed)
Spoke with patient, patient states she had figured this out after she placed call to [place]. I reiterated that she is to take the olmesartan in place of the losartan. Patient verbalized understanding

## 2021-03-02 ENCOUNTER — Encounter: Payer: Medicare Other | Admitting: Nurse Practitioner

## 2021-03-02 ENCOUNTER — Other Ambulatory Visit: Payer: Self-pay

## 2021-03-02 ENCOUNTER — Encounter: Payer: Self-pay | Admitting: Nurse Practitioner

## 2021-03-03 NOTE — Progress Notes (Signed)
This encounter was created in error - please disregard.

## 2021-03-04 ENCOUNTER — Encounter: Payer: Self-pay | Admitting: Family

## 2021-03-04 ENCOUNTER — Other Ambulatory Visit: Payer: Self-pay

## 2021-03-04 ENCOUNTER — Telehealth: Payer: Self-pay

## 2021-03-04 ENCOUNTER — Ambulatory Visit (INDEPENDENT_AMBULATORY_CARE_PROVIDER_SITE_OTHER): Payer: Medicare Other | Admitting: Family

## 2021-03-04 DIAGNOSIS — M85852 Other specified disorders of bone density and structure, left thigh: Secondary | ICD-10-CM | POA: Diagnosis not present

## 2021-03-04 DIAGNOSIS — Z23 Encounter for immunization: Secondary | ICD-10-CM

## 2021-03-04 DIAGNOSIS — Z0000 Encounter for general adult medical examination without abnormal findings: Secondary | ICD-10-CM

## 2021-03-04 MED ORDER — TETANUS-DIPHTH-ACELL PERTUSSIS 5-2.5-18.5 LF-MCG/0.5 IM SUSP: 0.5000 mL | Freq: Once | INTRAMUSCULAR | 0 refills | Status: AC

## 2021-03-04 NOTE — Patient Instructions (Signed)
Monique Cabrera , Thank you for taking time to come for your Medicare Wellness Visit. I appreciate your ongoing commitment to your health goals. Please review the following plan we discussed and let me know if I can assist you in the future.   Screening recommendations/referrals: Colonoscopy: N/A  Mammogram: N/A  Bone Density: Ordered today [facility] will call you for appointment  Recommended yearly ophthalmology/optometry visit for glaucoma screening and checkup Recommended yearly dental visit for hygiene and checkup  Vaccinations: Influenza vaccine: Up to date  Pneumococcal vaccine : Up to date  Tdap vaccine : Ordered today please Tdap vaccine at your pharmacy.  Shingles vaccine  Advanced directives: Yes   Conditions/risks identified: Advance age female > 18 yrs,type 2 DM ,dyslipidemia,Hypertension and Hx of smoking   Next appointment: 1 year    Preventive Care 1 Years and Older, Female Preventive care refers to lifestyle choices and visits with your health care provider that can promote health and wellness. What does preventive care include?  A yearly physical exam. This is also called an annual well check.  Dental exams once or twice a year.  Routine eye exams. Ask your health care provider how often you should have your eyes checked.  Personal lifestyle choices, including:  Daily care of your teeth and gums.  Regular physical activity.  Eating a healthy diet.  Avoiding tobacco and drug use.  Limiting alcohol use.  Practicing safe sex.  Taking low-dose aspirin every day.  Taking vitamin and mineral supplements as recommended by your health care provider. What happens during an annual well check? The services and screenings done by your health care provider during your annual well check will depend on your age, overall health, lifestyle risk factors, and family history of disease. Counseling  Your health care provider may ask you questions about  your:  Alcohol use.  Tobacco use.  Drug use.  Emotional well-being.  Home and relationship well-being.  Sexual activity.  Eating habits.  History of falls.  Memory and ability to understand (cognition).  Work and work Statistician.  Reproductive health. Screening  You may have the following tests or measurements:  Height, weight, and BMI.  Blood pressure.  Lipid and cholesterol levels. These may be checked every 5 years, or more frequently if you are over 50 years old.  Skin check.  Lung cancer screening. You may have this screening every year starting at age 46 if you have a 30-pack-year history of smoking and currently smoke or have quit within the past 15 years.  Fecal occult blood test (FOBT) of the stool. You may have this test every year starting at age 52.  Flexible sigmoidoscopy or colonoscopy. You may have a sigmoidoscopy every 5 years or a colonoscopy every 10 years starting at age 39.  Hepatitis C blood test.  Hepatitis B blood test.  Sexually transmitted disease (STD) testing.  Diabetes screening. This is done by checking your blood sugar (glucose) after you have not eaten for a while (fasting). You may have this done every 1-3 years.  Bone density scan. This is done to screen for osteoporosis. You may have this done starting at age 103.  Mammogram. This may be done every 1-2 years. Talk to your health care provider about how often you should have regular mammograms. Talk with your health care provider about your test results, treatment options, and if necessary, the need for more tests. Vaccines  Your health care provider may recommend certain vaccines, such as:  Influenza  vaccine. This is recommended every year.  Tetanus, diphtheria, and acellular pertussis (Tdap, Td) vaccine. You may need a Td booster every 10 years.  Zoster vaccine. You may need this after age 32.  Pneumococcal 13-valent conjugate (PCV13) vaccine. One dose is recommended  after age 67.  Pneumococcal polysaccharide (PPSV23) vaccine. One dose is recommended after age 31. Talk to your health care provider about which screenings and vaccines you need and how often you need them. This information is not intended to replace advice given to you by your health care provider. Make sure you discuss any questions you have with your health care provider. Document Released: 01/09/2016 Document Revised: 09/01/2016 Document Reviewed: 10/14/2015 Elsevier Interactive Patient Education  2017 [place] Prevention in the Home Falls can cause injuries. They can happen to people of all ages. There are many things you can do to make your home safe and to help prevent falls. What can I do on the outside of my home?  Regularly fix the edges of walkways and driveways and fix any cracks.  Remove anything that might make you trip as you walk through a door, such as a raised step or threshold.  Trim any bushes or trees on the path to your home.  Use bright outdoor lighting.  Clear any walking paths of anything that might make someone trip, such as rocks or tools.  Regularly check to see if handrails are loose or broken. Make sure that both sides of any steps have handrails.  Any raised decks and porches should have guardrails on the edges.  Have any leaves, snow, or ice cleared regularly.  Use sand or salt on walking paths during winter.  Clean up any spills in your garage right away. This includes oil or grease spills. What can I do in the bathroom?  Use night lights.  Install grab bars by the toilet and in the tub and shower. Do not use towel bars as grab bars.  Use non-skid mats or decals in the tub or shower.  If you need to sit down in the shower, use a plastic, non-slip stool.  Keep the floor dry. Clean up any water that spills on the floor as soon as it happens.  Remove soap buildup in the tub or shower regularly.  Attach bath mats securely with  double-sided non-slip rug tape.  Do not have throw rugs and other things on the floor that can make you trip. What can I do in the bedroom?  Use night lights.  Make sure that you have a light by your bed that is easy to reach.  Do not use any sheets or blankets that are too big for your bed. They should not hang down onto the floor.  Have a firm chair that has side arms. You can use this for support while you get dressed.  Do not have throw rugs and other things on the floor that can make you trip. What can I do in the kitchen?  Clean up any spills right away.  Avoid walking on wet floors.  Keep items that you use a lot in easy-to-reach places.  If you need to reach something above you, use a strong step stool that has a grab bar.  Keep electrical cords out of the way.  Do not use floor polish or wax that makes floors slippery. If you must use wax, use non-skid floor wax.  Do not have throw rugs and other things on the floor that can  make you trip. What can I do with my stairs?  Do not leave any items on the stairs.  Make sure that there are handrails on both sides of the stairs and use them. Fix handrails that are broken or loose. Make sure that handrails are as long as the stairways.  Check any carpeting to make sure that it is firmly attached to the stairs. Fix any carpet that is loose or worn.  Avoid having throw rugs at the top or bottom of the stairs. If you do have throw rugs, attach them to the floor with carpet tape.  Make sure that you have a light switch at the top of the stairs and the bottom of the stairs. If you do not have them, ask someone to add them for you. What else can I do to help prevent falls?  Wear shoes that:  Do not have high heels.  Have rubber bottoms.  Are comfortable and fit you well.  Are closed at the toe. Do not wear sandals.  If you use a stepladder:  Make sure that it is fully opened. Do not climb a closed stepladder.  Make  sure that both sides of the stepladder are locked into place.  Ask someone to hold it for you, if possible.  Clearly mark and make sure that you can see:  Any grab bars or handrails.  First and last steps.  Where the edge of each step is.  Use tools that help you move around (mobility aids) if they are needed. These include:  Canes.  Walkers.  Scooters.  Crutches.  Turn on the lights when you go into a dark area. Replace any light bulbs as soon as they burn out.  Set up your furniture so you have a clear path. Avoid moving your furniture around.  If any of your floors are uneven, fix them.  If there are any pets around you, be aware of where they are.  Review your medicines with your doctor. Some medicines can make you feel dizzy. This can increase your chance of falling. Ask your doctor what other things that you can do to help prevent falls. This information is not intended to replace advice given to you by your health care provider. Make sure you discuss any questions you have with your health care provider. Document Released: 10/09/2009 Document Revised: 05/20/2016 Document Reviewed: 01/17/2015 Elsevier Interactive Patient Education  2017 [employer].

## 2021-03-04 NOTE — Telephone Encounter (Signed)
Ms. anayah, arvanitis are scheduled for a virtual visit with your provider today.    Just as we do with appointments in the office, we must obtain your consent to participate.  Your consent will be active for this visit and any virtual visit you may have with one of our providers in the next 365 days.    If you have a MyChart account, I can also send a copy of this consent to you electronically.  All virtual visits are billed to your insurance company just like a traditional visit in the office.  As this is a virtual visit, video technology does not allow for your provider to perform a traditional examination.  This may limit your provider's ability to fully assess your condition.  If your provider identifies any concerns that need to be evaluated in person or the need to arrange testing such as labs, EKG, etc, we will make arrangements to do so.    Although advances in technology are sophisticated, we cannot ensure that it will always work on either your end or our end.  If the connection with a video visit is poor, we may have to switch to a telephone visit.  With either a video or telephone visit, we are not always able to ensure that we have a secure connection.   I need to obtain your verbal consent now.   Are you willing to proceed with your visit today?   TERENA BOHAN has provided verbal consent on 03/04/2021 for a virtual visit (video or telephone).   [place], [place] 03/04/2021  10:00 AM

## 2021-03-04 NOTE — Progress Notes (Signed)
    This service is provided via telemedicine  No vital signs collected/recorded due to the encounter was a telemedicine visit.   Location of patient (ex: home, work): Home.  Patient consents to a telephone visit: Yes.  Location of the provider (ex: office, home): [facility].  Name of any referring provider: Miller, Stephen M, MD   Names of all persons participating in the telemedicine service and their role in the encounter: Patient, Monique Cabrera, RMA, Ngetich, [name], NP.    Time spent on call: 8 minutes spent on the phone with Medical Assistant.   

## 2021-03-04 NOTE — Progress Notes (Signed)
Subjective:   Monique Cabrera is a 86 y.o. female who presents for Medicare Annual (Subsequent) preventive examination.  Review of Systems     Cardiac Risk Factors include: advanced age (>11men, >69 women);diabetes mellitus;dyslipidemia;hypertension;smoking/ tobacco exposure     Objective:    There were no vitals filed for this visit. There is no height or weight on file to calculate BMI.  Advanced Directives 03/04/2021 01/08/2021 09/18/2020 09/04/2020 05/21/2020 05/12/2020 04/03/2020  Does Patient Have a Medical Advance Directive? Yes Yes Yes Yes Yes Yes Yes  Type of Advance Directive Out of facility DNR (pink MOST or yellow form) Out of facility DNR (pink MOST or yellow form) Out of facility DNR (pink MOST or yellow form) Out of facility DNR (pink MOST or yellow form) Living will Living will Out of facility DNR (pink MOST or yellow form)  Does patient want to make changes to medical advance directive? No - Patient declined No - Patient declined No - Guardian declined No - Guardian declined No - Patient declined No - Patient declined No - Patient declined  Would patient like information on creating a medical advance directive? - - - - - - -  Pre-existing out of facility DNR order (yellow form or pink MOST form) - - Pink MOST/Yellow Form most recent copy in chart - Physician notified to receive inpatient order - - - -    Current Medications (verified) Outpatient Encounter Medications as of 03/04/2021  Medication Sig  . amLODipine (NORVASC) 10 MG tablet Take 1 tablet (10 mg total) by mouth daily.  . ASPIRIN LOW DOSE 81 MG EC tablet Take 81 mg by mouth daily.  Marland Kitchen atorvastatin (LIPITOR) 40 MG tablet Take 1 tablet (40 mg total) by mouth daily.  . calcitRIOL (ROCALTROL) 0.25 MCG capsule Take 0.25 mcg by mouth every other day. Monday, Wednesday, and Friday.  . carvedilol (COREG) 25 MG tablet TAKE 1 TABLET BY MOUTH TWICE A DAY WITH MEALS  . clopidogrel (PLAVIX) 75 MG tablet TAKE 1 TABLET BY MOUTH EVERY  DAY  . feeding supplement, GLUCERNA SHAKE, (GLUCERNA SHAKE) LIQD Take 237 mLs by mouth as needed.  . Glucose 15 GM/32ML GEL Use as needed for low blood sugars  . insulin lispro (HUMALOG) 100 UNIT/ML injection Inject 0.03 mLs (3 Units total) into the skin 3 (three) times daily after meals.  . Insulin Pen Needle (B-D ULTRAFINE III SHORT PEN) 31G X 8 MM MISC Use to check blood sugar every day. Dx: 11.29; 11.65  . LANTUS SOLOSTAR 100 UNIT/ML Solostar Pen Inject 26 Units into the skin daily.  . Multiple Vitamin (MULTIVITAMIN WITH MINERALS) TABS tablet Take 1 tablet by mouth daily.  Marland Kitchen olmesartan (BENICAR) 20 MG tablet Take 1 tablet (20 mg total) by mouth daily.  Glory Rosebush VERIO test strip USE TO TEST BLOOD SUGAR THREE TIMES DAILY. DX: E11.9  . torsemide (DEMADEX) 20 MG tablet Take 20 mg by mouth daily.   No facility-administered encounter medications on file as of 03/04/2021.    Allergies (verified) Invokana [canagliflozin], Jardiance [empagliflozin], and Tuberculin ppd   History: Past Medical History:  Diagnosis Date  . Acute upper respiratory infections of unspecified site   . Amputee 08/2019  . Anemia   . Anemia, unspecified   . Arthritis   . Atherosclerosis of native arteries of the extremities, unspecified   . Chest pain, unspecified   . Chronic kidney disease (CKD), stage II (mild)    [facility]  . Diabetes mellitus without complication ([place])  Type II  . Diarrhea   . Disorder of bone and cartilage, unspecified   . Herpes zoster with other nervous system complications(053.19)   . History of blood transfusion   . Hypercalcemia   . Hypertension   . Hypertensive renal disease, benign   . Nonspecific reaction to tuberculin skin test without active tuberculosis(795.51)   . Other and unspecified hyperlipidemia   . PAD (peripheral artery disease) ([place])    Per records from [name]  . Pain in joint, lower leg   . Peripheral arterial disease ([place])   . Postherpetic  neuralgia   . Proteinuria   . Stroke ([place]) 01/2017  . Type II or unspecified type diabetes mellitus with renal manifestations, uncontrolled(250.42)   . Unspecified disorder of kidney and ureter    Past Surgical History:  Procedure Laterality Date  . ABDOMINAL AORTOGRAM W/LOWER EXTREMITY N/A 10/31/2019   Procedure: ABDOMINAL AORTOGRAM W/LOWER EXTREMITY;  Surgeon: Wellington Hampshire, MD;  Location: [facility];  Service: Cardiovascular;  Laterality: N/A;  . ABDOMINAL AORTOGRAM W/LOWER EXTREMITY Left 05/12/2020   Procedure: ABDOMINAL AORTOGRAM W/LOWER EXTREMITY;  Surgeon: Waynetta Sandy, MD;  Location: [facility];  Service: Cardiovascular;  Laterality: Left;  . AMPUTATION Right 11/02/2019   Procedure: AMPUTATION BELOW KNEE RIGHT;  Surgeon: Rosetta Posner, MD;  Location: [facility];  Service: Vascular;  Laterality: Right;  . COLONOSCOPY     x 2  . ENDARTERECTOMY FEMORAL Left 05/21/2020   Procedure: LEFT COMMON FEMORAL ENDARTERECTOMY;  Surgeon: Rosetta Posner, MD;  Location: [place];  Service: Vascular;  Laterality: Left;  . FEMORAL-POPLITEAL BYPASS GRAFT Left 05/21/2020   Procedure: LEFT FEMORAL TO BELOW KNEE POPLITEAL ARTERY BYPASS GRAFT;  Surgeon: Rosetta Posner, MD;  Location: [facility];  Service: Vascular;  Laterality: Left;  . hysterectomy    . INCISION AND DRAINAGE Left 05/27/14   sebacous cyst, ear  . PRP Left    Dr. Ricki Miller  . removal of cyst from hand    . removal of tumor from foot    . TONSILLECTOMY     Family History  Problem Relation Age of Onset  . Diabetes Mother   . Diabetes Father   . Diabetes Sister   . Diabetes Sister    Social History   Socioeconomic History  . Marital status: Widowed    Spouse name: Not on file  . Number of children: 6  . Years of education: 70  . Highest education level: Not on file  Occupational History    Comment: retired, [employer]  Tobacco Use  . Smoking status: Former Smoker    Types: Cigarettes  . Smokeless tobacco: Never Used   . Tobacco comment: Quit about age 88  " it was really a habit"  Vaping Use  . Vaping Use: Never used  Substance and Sexual Activity  . Alcohol use: No    Alcohol/week: 0.0 standard drinks  . Drug use: No  . Sexual activity: Never  Other Topics Concern  . Not on file  Social History Narrative   Has moved in with her daughter.   Caffeine- coffee 2-3 cups daily, soda off and on   Social Determinants of Health   Financial Resource Strain: Not on file  Food Insecurity: Not on file  Transportation Needs: Not on file  Physical Activity: Not on file  Stress: Not on file  Social Connections: Not on file    Tobacco Counseling Counseling given: Not Answered Comment: Quit about age 72  "  it was really a habit"   Clinical Intake:  Pre-visit preparation completed: No  Pain : No/denies pain     BMI - recorded: 22.96 Nutritional Status: BMI of 19-24  Normal Nutritional Risks: None Diabetes: Yes CBG done?: Yes (checked at home today was 220) CBG resulted in Enter/ Edit results?: Yes (220) Did pt. bring in CBG monitor from home?: No (recall CBG 150's - 200's)  How often do you need to have someone help you when you read instructions, pamphlets, or other written materials from your doctor or pharmacy?: 1 - Never What is the last grade level you completed in school?: [employer] 2 yrs  Diabetic?Yes   Interpreter Needed?: No  Information entered by :: Carsyn Taubman,FNP-C   Activities of Daily Living In your present state of health, do you have any difficulty performing the following activities: 03/04/2021 05/21/2020  Hearing? N N  Vision? N N  Difficulty concentrating or making decisions? Y N  Comment Remembering -  Walking or climbing stairs? Tempie Donning  Comment uses a cane -  Dressing or bathing? N Y  Doing errands, shopping? Aggie Moats  Comment Daughter assist -  Preparing Food and eating ? N -  Using the Toilet? N -  In the past six months, have you accidently leaked urine? N -  Do  you have problems with loss of bowel control? N -  Managing your Medications? N -  Managing your Finances? N -  Housekeeping or managing your Housekeeping? N -  Some recent data might be hidden    Patient Care Team: Wardell Honour, MD as PCP - General (Family Medicine) Wellington Hampshire, MD as PCP - Cardiology (Cardiology) Adrian Prows, MD as Consulting Physician (Cardiology) Rutherford Guys, MD as Consulting Physician (Ophthalmology) Justin Mend, MD as Attending Physician (Internal Medicine) Justin Mend, MD as Attending Physician (Internal Medicine)  Indicate any recent Medical Services you may have received from other than [facility] providers in the past year (date may be approximate).     Assessment:   This is a routine wellness examination for [place].  Hearing/Vision screen  Hearing Screening   125Hz  250Hz  500Hz  1000Hz  2000Hz  3000Hz  4000Hz  6000Hz  8000Hz   Right ear:           Left ear:           Comments: No Hearing Concerns.   Vision Screening Comments: No Vision Concerns. Patient wears prescription glasses.   Dietary issues and exercise activities discussed: Current Exercise Habits: Home exercise routine, Time (Minutes): 45, Frequency (Times/Week): 1, Weekly Exercise (Minutes/Week): 45, Intensity: Moderate, Exercise limited by: Other - see comments (gait instability)  Goals    . Exercise 3x per week (30 min per time)     Patient would like to do chair exercises and walk 2-3 times a week.    . Weight (lb) < 135 lb (61.2 kg)     Starting 01/06/2017, I will attempt to decrease my current weight of 147 lb to my goal weight of 135 lb.       Depression Screen PHQ 2/9 Scores 03/04/2021 09/04/2020 04/03/2020 03/06/2020 01/24/2020 12/13/2019 10/17/2019  PHQ - 2 Score 0 0 0 0 0 0 1  PHQ- 9 Score - - - - - - -    Fall Risk Fall Risk  03/04/2021 09/18/2020 09/04/2020 04/03/2020 03/06/2020  Falls in the past year? 1 0 0 0 1  Number falls in past yr: 0 0 0 0 1  Comment - - - - -  Injury with Fall? 0 0 0 0 1  Risk for fall due to : - - History of fall(s);Orthopedic patient;Medication side effect;Impaired balance/gait;Impaired mobility - -  Follow up - - Falls evaluation completed;Education provided;Falls prevention discussed - -    FALL RISK PREVENTION PERTAINING TO THE HOME:  Any stairs in or around the home? No  If so, are there any without handrails? No  Home free of loose throw rugs in walkways, pet beds, electrical cords, etc? No  Adequate lighting in your home to reduce risk of falls? Yes   ASSISTIVE DEVICES UTILIZED TO PREVENT FALLS:  Life alert? Yes  Use of a cane, walker or w/c? Yes  Grab bars in the bathroom? No  Shower chair or bench in shower? Yes  Elevated toilet seat or a handicapped toilet? No   TIMED UP AND GO:  Was the test performed? No .  Length of time to ambulate 10 feet: N/A  sec.   Gait slow and steady with assistive device  Cognitive Function: MMSE - Mini Mental State Exam 02/07/2019 02/06/2018 01/06/2017  Not completed: - (No Data) -  Orientation to time 5 5 5   Orientation to Place 5 5 5   Registration 3 3 3   Attention/ Calculation 5 5 5   Recall 3 2 3   Language- name 2 objects 2 2 2   Language- repeat 1 1 1   Language- follow 3 step command 3 3 3   Language- read & follow direction 1 1 1   Write a sentence 1 1 1   Copy design 0 1 1  Total score 29 29 30      6CIT Screen 03/04/2021  What Year? 0 points  What month? 0 points  What time? 0 points  Count back from 20 0 points  Months in reverse 0 points  Repeat phrase 0 points  Total Score 0    Immunizations Immunization History  Administered Date(s) Administered  . Fluad Quad(high Dose 65+) 08/23/2019, 09/04/2020  . Influenza, High Dose Seasonal PF 09/30/2018  . Influenza,inj,Quad PF,6+ Mos 09/24/2013, 09/19/2014, 09/13/2016  . Influenza-Unspecified 11/22/2009, 10/01/2011, 09/10/2015, 09/26/2017  . PFIZER(Purple Top)SARS-COV-2 Vaccination 03/08/2020, 03/31/2020,  11/27/2020  . PPD Test 01/12/2010  . Pneumococcal Conjugate-13 11/24/2015  . Pneumococcal Polysaccharide-23 12/27/2005    TDAP status: Due, Education has been provided regarding the importance of this vaccine. Advised may receive this vaccine at local pharmacy or Health Dept. Aware to provide a copy of the vaccination record if obtained from local pharmacy or Health Dept. Verbalized acceptance and understanding.  Flu Vaccine status: Up to date  Pneumococcal vaccine status: Up to date  Covid-19 vaccine status: Completed vaccines  Qualifies for Shingles Vaccine? No  has had shingles  Zostavax completed No   Shingrix Completed?: No.    Education has been provided regarding the importance of this vaccine. Patient has been advised to call insurance company to determine out of pocket expense if they have not yet received this vaccine. Advised may also receive vaccine at local pharmacy or Health Dept. Verbalized acceptance and understanding.  Screening Tests Health Maintenance  Topic Date Due  . OPHTHALMOLOGY EXAM  07/25/2020  . HEMOGLOBIN A1C  07/05/2021  . FOOT EXAM  11/11/2021  . INFLUENZA VACCINE  Completed  . DEXA SCAN  Completed  . COVID-19 Vaccine  Completed  . PNA vac Low Risk Adult  Completed  . HPV VACCINES  Aged Out    Health Maintenance  Health Maintenance Due  Topic Date Due  . OPHTHALMOLOGY EXAM  07/25/2020  Colorectal cancer screening: No longer required.   Mammogram status: No longer required due to advance age .  Bone Density status: Completed 09/23/2015. Results reflect: Bone density results: OSTEOPENIA. Repeat every 2 years.  Lung Cancer Screening: (Low Dose CT Chest recommended if Age 95-80 years, 30 pack-year currently smoking OR have quit w/in 15years.) does not qualify.   Lung Cancer Screening Referral: No   Additional Screening:  Hepatitis C Screening: does not qualify; Completed Yes   Vision Screening: Recommended annual ophthalmology exams for  early detection of glaucoma and other disorders of the eye. Is the patient up to date with their annual eye exam?  Yes  Who is the provider or what is the name of the office in which the patient attends annual eye exams? Dr. Catalina Antigua  If pt is not established with a provider, would they like to be referred to a provider to establish care? No .   Dental Screening: Recommended annual dental exams for proper oral hygiene  Community Resource Referral / Chronic Care Management: CRR required this visit?  No   CCM required this visit?  No      Plan:    - Tdap Vaccine  - Dexa Scan   I have personally reviewed and noted the following in the patient's chart:   . Medical and social history . Use of alcohol, tobacco or illicit drugs  . Current medications and supplements . Functional ability and status . Nutritional status . Physical activity . Advanced directives . List of other physicians . Hospitalizations, surgeries, and ER visits in previous 12 months . Vitals . Screenings to include cognitive, depression, and falls . Referrals and appointments  In addition, I have reviewed and discussed with patient certain preventive protocols, quality metrics, and best practice recommendations. A written personalized care plan for preventive services as well as general preventive health recommendations were provided to patient.     Sandrea Hughs, NP   03/04/2021   Nurse Notes: Advised to get Tdap vaccine at her Pharmacy.

## 2021-03-09 ENCOUNTER — Other Ambulatory Visit: Payer: Self-pay | Admitting: Internal Medicine

## 2021-03-17 ENCOUNTER — Encounter: Payer: Self-pay | Admitting: Physician Assistant

## 2021-03-17 ENCOUNTER — Ambulatory Visit (INDEPENDENT_AMBULATORY_CARE_PROVIDER_SITE_OTHER): Payer: Medicare Other | Admitting: Physician Assistant

## 2021-03-17 ENCOUNTER — Other Ambulatory Visit: Payer: Self-pay

## 2021-03-17 ENCOUNTER — Ambulatory Visit (HOSPITAL_COMMUNITY)
Admission: RE | Admit: 2021-03-17 | Discharge: 2021-03-17 | Disposition: A | Discharge status: Home / Self Care | Payer: Medicare Other | Source: Ambulatory Visit | Attending: Vascular Surgery | Admitting: Vascular Surgery

## 2021-03-17 ENCOUNTER — Ambulatory Visit (INDEPENDENT_AMBULATORY_CARE_PROVIDER_SITE_OTHER)
Admission: RE | Admit: 2021-03-17 | Discharge: 2021-03-17 | Disposition: A | Discharge status: Home / Self Care | Payer: Medicare Other | Source: Ambulatory Visit | Attending: Vascular Surgery | Admitting: Vascular Surgery

## 2021-03-17 VITALS — BP 192/99 | HR 109 | Temp 98.0°F | Resp 20 | Ht 65.0 in | Wt 138.2 lb

## 2021-03-17 DIAGNOSIS — I739 Peripheral vascular disease, unspecified: Secondary | ICD-10-CM | POA: Insufficient documentation

## 2021-03-17 DIAGNOSIS — Z89511 Acquired absence of right leg below knee: Secondary | ICD-10-CM

## 2021-03-17 NOTE — Progress Notes (Signed)
Office Note     CC:  follow up Requesting Provider:  [name], DO  HPI: Monique Cabrera is a 86 y.o. ([birth year]) female who presents for follow up of peripheral artery disease. She has history of right BKA in November of 2020 by Dr. Donnetta Hutching for gangrene of her right foot. She more recently is  s/p left femoral endarterectomy and left femoral to below-knee popliteal bypass with Gore-Tex for critical limb ischemia.  This was performed on May 21, 2020 by Dr. Donnetta Hutching.  She also had history of ulceration at the tip of her left second toe that was healed at her last visit in September. She has been compliant with her Plavix, Aspirin and statin.   Today she reports that her legs are feeling good. She has had no issues with her right BKA. She uses cane mostly for ambulation but also has rolling walker. She does report over past two months nightly cramping in her left calf, right thigh, and hands. She does have cramping in her hands throughout day as well. She says this started after she began taking torsemide.   The pt is on a statin for cholesterol management.  The pt is not on a daily aspirin.   Other AC:  Plavix The pt is on CCB, diuretic for hypertension.   The pt is diabetic.  Tobacco hx: former  Past Medical History:  Diagnosis Date  . Acute upper respiratory infections of unspecified site   . Amputee 08/2019  . Anemia   . Anemia, unspecified   . Arthritis   . Atherosclerosis of native arteries of the extremities, unspecified   . Chest pain, unspecified   . Chronic kidney disease (CKD), stage II (mild)    [facility]  . Diabetes mellitus without complication ([place])    Type II  . Diarrhea   . Disorder of bone and cartilage, unspecified   . Herpes zoster with other nervous system complications(053.19)   . History of blood transfusion   . Hypercalcemia   . Hypertension   . Hypertensive renal disease, benign   . Nonspecific reaction to tuberculin skin test without active  tuberculosis(795.51)   . Other and unspecified hyperlipidemia   . PAD (peripheral artery disease) ([place])    Per records from [facility]  . Pain in joint, lower leg   . Peripheral arterial disease ([place])   . Postherpetic neuralgia   . Proteinuria   . Stroke ([place]) 01/2017  . Type II or unspecified type diabetes mellitus with renal manifestations, uncontrolled(250.42)   . Unspecified disorder of kidney and ureter     Past Surgical History:  Procedure Laterality Date  . ABDOMINAL AORTOGRAM W/LOWER EXTREMITY N/A 10/31/2019   Procedure: ABDOMINAL AORTOGRAM W/LOWER EXTREMITY;  Surgeon: Wellington Hampshire, MD;  Location: [place] [facility];  Service: Cardiovascular;  Laterality: N/A;  . ABDOMINAL AORTOGRAM W/LOWER EXTREMITY Left 05/12/2020   Procedure: ABDOMINAL AORTOGRAM W/LOWER EXTREMITY;  Surgeon: Waynetta Sandy, MD;  Location: [place] [facility];  Service: Cardiovascular;  Laterality: Left;  . AMPUTATION Right 11/02/2019   Procedure: AMPUTATION BELOW KNEE RIGHT;  Surgeon: Rosetta Posner, MD;  Location: [facility];  Service: Vascular;  Laterality: Right;  . COLONOSCOPY     x 2  . ENDARTERECTOMY FEMORAL Left 05/21/2020   Procedure: LEFT COMMON FEMORAL ENDARTERECTOMY;  Surgeon: Rosetta Posner, MD;  Location: [place];  Service: Vascular;  Laterality: Left;  . FEMORAL-POPLITEAL BYPASS GRAFT Left 05/21/2020   Procedure: LEFT FEMORAL TO BELOW  KNEE POPLITEAL ARTERY BYPASS GRAFT;  Surgeon: Rosetta Posner, MD;  Location: [name] OR;  Service: Vascular;  Laterality: Left;  . hysterectomy    . INCISION AND DRAINAGE Left 05/27/14   sebacous cyst, ear  . PRP Left    Dr. Ricki Miller  . removal of cyst from hand    . removal of tumor from foot    . TONSILLECTOMY      Social History   Socioeconomic History  . Marital status: Widowed    Spouse name: Not on file  . Number of children: 6  . Years of education: 64  . Highest education level: Not on file  Occupational History    Comment: retired, [employer]   Tobacco Use  . Smoking status: Former Smoker    Types: Cigarettes  . Smokeless tobacco: Never Used  . Tobacco comment: Quit about age 9  " it was really a habit"  Vaping Use  . Vaping Use: Never used  Substance and Sexual Activity  . Alcohol use: No    Alcohol/week: 0.0 standard drinks  . Drug use: No  . Sexual activity: Never  Other Topics Concern  . Not on file  Social History Narrative   Has moved in with her daughter.   Caffeine- coffee 2-3 cups daily, soda off and on   Social Determinants of Health   Financial Resource Strain: Not on file  Food Insecurity: Not on file  Transportation Needs: Not on file  Physical Activity: Not on file  Stress: Not on file  Social Connections: Not on file  Intimate Partner Violence: Not on file    Family History  Problem Relation Age of Onset  . Diabetes Mother   . Diabetes Father   . Diabetes Sister   . Diabetes Sister     Current Outpatient Medications  Medication Sig Dispense Refill  . amLODipine (NORVASC) 10 MG tablet Take 1 tablet (10 mg total) by mouth daily. 90 tablet 2  . ASPIRIN LOW DOSE 81 MG EC tablet Take 81 mg by mouth daily.    Marland Kitchen atorvastatin (LIPITOR) 40 MG tablet Take 1 tablet (40 mg total) by mouth daily. 90 tablet 1  . calcitRIOL (ROCALTROL) 0.25 MCG capsule Take 0.25 mcg by mouth every other day. Monday, Wednesday, and Friday.    . carvedilol (COREG) 25 MG tablet TAKE 1 TABLET BY MOUTH TWICE A DAY WITH MEALS 180 tablet 1  . clopidogrel (PLAVIX) 75 MG tablet TAKE 1 TABLET BY MOUTH EVERY DAY 90 tablet 1  . feeding supplement, GLUCERNA SHAKE, (GLUCERNA SHAKE) LIQD Take 237 mLs by mouth as needed.    . Glucose 15 GM/32ML GEL Use as needed for low blood sugars 32 mL 5  . insulin lispro (HUMALOG) 100 UNIT/ML injection Inject 0.03 mLs (3 Units total) into the skin 3 (three) times daily after meals. 10 mL 11  . Insulin Pen Needle (B-D ULTRAFINE III SHORT PEN) 31G X 8 MM MISC Use to check blood sugar every day. Dx:  11.29; 11.65 100 each 1  . LANTUS SOLOSTAR 100 UNIT/ML Solostar Pen Inject 26 Units into the skin daily. 15 mL 1  . Multiple Vitamin (MULTIVITAMIN WITH MINERALS) TABS tablet Take 1 tablet by mouth daily.    Marland Kitchen olmesartan (BENICAR) 20 MG tablet Take 1 tablet (20 mg total) by mouth daily. 90 tablet 3  . ONETOUCH VERIO test strip USE TO TEST BLOOD SUGAR THREE TIMES DAILY. DX: E11.9 300 strip 3  . torsemide (DEMADEX) 20 MG tablet Take  20 mg by mouth daily.     No current facility-administered medications for this visit.    Allergies  Allergen Reactions  . Invokana [Canagliflozin] Itching, Swelling and Other (See Comments)    Vaginal itching, swelling and irritation  . Jardiance [Empagliflozin] Itching, Swelling and Other (See Comments)    Vaginal itching and swelling  . Tuberculin Ppd Other (See Comments)    Per records from [place]:  [X]  denotes positive finding, [ ]  denotes negative finding Cardiac  Comments:  Chest pain or chest pressure:    Shortness of breath upon exertion:    Short of breath when lying flat:    Irregular heart rhythm:        Vascular    Pain in calf, thigh, or hip brought on by ambulation:    Pain in feet at night that wakes you up from your sleep:     Blood clot in your veins:    Leg swelling:         Pulmonary    Oxygen at home:    Productive cough:     Wheezing:         Neurologic    Sudden weakness in arms or legs:     Sudden numbness in arms or legs:     Sudden onset of difficulty speaking or slurred speech:    Temporary loss of vision in one eye:     Problems with dizziness:         Gastrointestinal    Blood in stool:     Vomited blood:         Genitourinary    Burning when urinating:     Blood in urine:        Psychiatric    Major depression:         Hematologic    Bleeding problems:    Problems with blood clotting too easily:        Skin    Rashes or ulcers:        Constitutional    Fever or  chills:      PHYSICAL EXAMINATION:  Vitals:   03/17/21 1433  BP: (!) 192/99  Pulse: (!) 109  Resp: 20  Temp: 98 F (36.7 C)  TempSrc: Temporal  SpO2: 99%  Weight: 138 lb 3.2 oz (62.7 kg)  Height: 5\' 5"  (1.651 m)    General:  WDWN in NAD; vital signs documented above Gait: Not observed HENT: WNL, normocephalic Pulmonary: normal non-labored breathing , without Rales, rhonchi,  wheezing Cardiac: regular HR, without  Murmurs without carotid bruit Abdomen: soft, NT, no masses Skin: without rashes Vascular Exam/Pulses:  Right Left  Radial 2+ (normal) 2+ (normal)  Femoral 2+ (normal) 2+ (normal)  Popliteal BKA Pulse in bypass graft  DP BKA 2+ (normal)  PT BLA Not palpable   Extremities: without ischemic changes, without Gangrene , without cellulitis; without open wounds;  Musculoskeletal: no muscle wasting or atrophy  Neurologic: A&O X 3;  No focal weakness or paresthesias are detected Psychiatric:  The pt has Normal affect.   Non-Invasive Vascular Imaging:    Left lower extremity common femoral to below knee popliteal artery bypass graft is patent without evidence of restenosis. Triphasic and biphasic flow throughout.   +-------+-----------+-----------+------------+------------+  ABI/TBIToday's ABIToday's TBIPrevious ABIPrevious TBI  +-------+-----------+-----------+------------+------------+  Right BKA    -     -      -        +-------+-----------+-----------+------------+------------+  Left  [place]     [place]     [place]     0.3       +-------+-----------+-----------+------------+------------+  Left great toe pressure of 64 mmHg  ASSESSMENT/PLAN:: 86 y.o. female here for follow up for peripheral arterial disease.  She is status post left femoral endarterectomy and common femoral to below-knee popliteal artery bypass with Gore-Tex graft.  Her graft is patent on duplex evaluation today.  Noncompressible left lower extremity  arteries.  Toe pressure 64.  Her foot is warm and well-perfused.  Her toe ulcer remains healed. Her right BKA is well healed. Recommend she follow up with her Nephrologist regarding her Torsemide and her lower extremity cramping. I likely suspect this due to electrolyte abnormality. - Continue aspirin, statin, Plavix.   - Continue walking program. - Call our office for onset of lower extremity pain with exercise, rest pain,or  nonhealing wounds - Follow-up in 6 months with repeat LLE bypass graft duplex and ABIs  Karoline Caldwell, PA-C [facility] [phone]  Clinic MD:  Roxanne Mins

## 2021-03-19 ENCOUNTER — Encounter: Payer: Self-pay | Admitting: *Deleted

## 2021-03-19 ENCOUNTER — Other Ambulatory Visit: Payer: Self-pay | Admitting: *Deleted

## 2021-03-19 ENCOUNTER — Other Ambulatory Visit: Payer: Self-pay

## 2021-03-19 ENCOUNTER — Other Ambulatory Visit: Payer: Self-pay | Admitting: Internal Medicine

## 2021-03-19 DIAGNOSIS — I739 Peripheral vascular disease, unspecified: Secondary | ICD-10-CM

## 2021-03-19 NOTE — Patient Outreach (Signed)
[name]) Care Management  [place]  03/21/2021   Monique Cabrera [birth year] [id number]   Referral Date: 3/21 Referral Source: Insurance Referral Reason: High Risk patient Insurance: [place] attempt #1, successful. Identity verified.  This care manager introduced self and stated purpose of call.  [facility] care management services explained.    Social: Patient report she has been living with daughter for the last year, but able to perform all ADL's/IADL's independently.  State she was told she couldn't live alone once she had her amputation.  She is active with the [facility] group in the community, goes to the center 3 days a week.    Conditions: Per chart, has history of HTN, CAD, PAD, CHF, CVA, Hep C, DM, CKD, and HLD.  Has not been checking her blood pressure daily, but does monitor blood sugars (usually less than 150).  State she will restart daily blood pressure checks.  Medications: Reviewed with member, state she is taking as instructed but hasn't had Coreg in about a week.  She requested refill but pharmacy refilled Plavix instead, will call to fix error.  Appointments: Has follow up for labs on 4/11 and wit hPCP on 4/13.  Was just seen yesterday by vascular MD.  Uses transportation services through insurance.  Advance Directives: Has out of facility DNR in place, no changes at this time.  Consent: Agrees to [facility] involvement but does not want to be contacted more than quarterly.   Encounter Medications:  Outpatient Encounter Medications as of 03/19/2021  Medication Sig  . amLODipine (NORVASC) 10 MG tablet Take 1 tablet (10 mg total) by mouth daily.  . ASPIRIN LOW DOSE 81 MG EC tablet Take 81 mg by mouth daily.  Marland Kitchen atorvastatin (LIPITOR) 40 MG tablet Take 1 tablet (40 mg total) by mouth daily.  . calcitRIOL (ROCALTROL) 0.25 MCG capsule Take 0.25 mcg by mouth every other day. Monday, Wednesday, and Friday.  . clopidogrel (PLAVIX) 75 MG  tablet TAKE 1 TABLET BY MOUTH EVERY DAY  . feeding supplement, GLUCERNA SHAKE, (GLUCERNA SHAKE) LIQD Take 237 mLs by mouth as needed.  . Glucose 15 GM/32ML GEL Use as needed for low blood sugars  . insulin lispro (HUMALOG) 100 UNIT/ML injection Inject 0.03 mLs (3 Units total) into the skin 3 (three) times daily after meals.  . Insulin Pen Needle (B-D ULTRAFINE III SHORT PEN) 31G X 8 MM MISC Use to check blood sugar every day. Dx: 11.29; 11.65  . LANTUS SOLOSTAR 100 UNIT/ML Solostar Pen Inject 26 Units into the skin daily.  . Multiple Vitamin (MULTIVITAMIN WITH MINERALS) TABS tablet Take 1 tablet by mouth daily.  Marland Kitchen olmesartan (BENICAR) 20 MG tablet Take 1 tablet (20 mg total) by mouth daily.  Glory Rosebush VERIO test strip USE TO TEST BLOOD SUGAR THREE TIMES DAILY. DX: E11.9  . torsemide (DEMADEX) 20 MG tablet Take 20 mg by mouth daily.  . [DISCONTINUED] carvedilol (COREG) 25 MG tablet TAKE 1 TABLET BY MOUTH TWICE A DAY WITH MEALS   No facility-administered encounter medications on file as of 03/19/2021.    Functional Status:  In your present state of health, do you have any difficulty performing the following activities: 03/04/2021 05/21/2020  Hearing? N N  Vision? N N  Difficulty concentrating or making decisions? Y N  Comment Remembering -  Walking or climbing stairs? Tempie Donning  Comment uses a cane -  Dressing or bathing? N Y  Doing errands, shopping? Aggie Moats  Comment Daughter assist -  Preparing Food and eating ? N -  Using the Toilet? N -  In the past six months, have you accidently leaked urine? N -  Do you have problems with loss of bowel control? N -  Managing your Medications? N -  Managing your Finances? N -  Housekeeping or managing your Housekeeping? N -  Some recent data might be hidden    Fall/Depression Screening: Fall Risk  03/19/2021 03/04/2021 09/18/2020  Falls in the past year? 1 1 0  Number falls in past yr: 0 0 0  Comment - - -  Injury with Fall? 0 0 0  Risk for fall due to :  History of fall(s) - -  Follow up Falls prevention discussed - -   PHQ 2/9 Scores 03/19/2021 03/04/2021 09/04/2020 04/03/2020 03/06/2020 01/24/2020 12/13/2019  PHQ - 2 Score 0 0 0 0 0 0 0  PHQ- 9 Score - - - - - - -    Assessment:  Goals Addressed            This Visit's Progress   . Make and Keep All Appointments       Timeframe:  Long-Range Goal Priority:  High Start Date:            3/24                 Expected End Date:   6/24                    Follow Up Date 4/7   - ask family or friend for a ride - call to cancel if needed    Why is this important?    Part of staying healthy is seeing the doctor for follow-up care.   If you forget your appointments, there are some things you can do to stay on track.    Notes:     . Track and Manage My Blood Pressure-Hypertension       Timeframe:  Long-Range Goal Priority:  Medium Start Date:       3/24                      Expected End Date:   6/24                    Follow Up Date 4/7    - check blood pressure daily    Why is this important?    You won't feel high blood pressure, but it can still hurt your blood vessels.   High blood pressure can cause heart or kidney problems. It can also cause a stroke.   Making lifestyle changes like losing a little weight or eating less salt will help.   Checking your blood pressure at home and at different times of the day can help to control blood pressure.   If the doctor prescribes medicine remember to take it the way the doctor ordered.   Call the office if you cannot afford the medicine or if there are questions about it.     Notes:        Plan:  Follow-up:  Patient agrees to Care Plan and Follow-up.  Will send welcome letter and [facility] calendar to record daily readings.  Will send HTN education.  Will notify PCP of involvement.  Will follow up with member within the next 2 weeks to confirm she has Coreg, will follow up quarterly from that point.  [name], [place], MSN [place] [phone]

## 2021-04-03 ENCOUNTER — Other Ambulatory Visit: Payer: Self-pay

## 2021-04-03 ENCOUNTER — Other Ambulatory Visit: Payer: Self-pay | Admitting: *Deleted

## 2021-04-03 DIAGNOSIS — E1151 Type 2 diabetes mellitus with diabetic peripheral angiopathy without gangrene: Secondary | ICD-10-CM

## 2021-04-03 DIAGNOSIS — E1169 Type 2 diabetes mellitus with other specified complication: Secondary | ICD-10-CM

## 2021-04-03 DIAGNOSIS — IMO0002 Reserved for concepts with insufficient information to code with codable children: Secondary | ICD-10-CM

## 2021-04-03 DIAGNOSIS — E1129 Type 2 diabetes mellitus with other diabetic kidney complication: Secondary | ICD-10-CM

## 2021-04-03 DIAGNOSIS — N184 Chronic kidney disease, stage 4 (severe): Secondary | ICD-10-CM

## 2021-04-03 NOTE — Patient Outreach (Signed)
[facility]) Care Management  04/03/2021  Monique Cabrera 03/21/1935 [id number]   Outgoing call placed to member to follow up on issue with Coreg.  State she does now have the medication and taking as instructed.  Denies any further urgent concerns, agrees to follow up within the next 3 months (does not want monthly calls).  [name], [place], MSN [place] [phone]

## 2021-04-06 ENCOUNTER — Other Ambulatory Visit: Payer: Self-pay

## 2021-04-06 ENCOUNTER — Other Ambulatory Visit: Payer: Medicare Other

## 2021-04-06 DIAGNOSIS — E1165 Type 2 diabetes mellitus with hyperglycemia: Secondary | ICD-10-CM | POA: Diagnosis not present

## 2021-04-06 DIAGNOSIS — N184 Chronic kidney disease, stage 4 (severe): Secondary | ICD-10-CM | POA: Diagnosis not present

## 2021-04-06 DIAGNOSIS — E1169 Type 2 diabetes mellitus with other specified complication: Secondary | ICD-10-CM | POA: Diagnosis not present

## 2021-04-06 DIAGNOSIS — E1129 Type 2 diabetes mellitus with other diabetic kidney complication: Secondary | ICD-10-CM | POA: Diagnosis not present

## 2021-04-06 DIAGNOSIS — E1151 Type 2 diabetes mellitus with diabetic peripheral angiopathy without gangrene: Secondary | ICD-10-CM | POA: Diagnosis not present

## 2021-04-07 ENCOUNTER — Other Ambulatory Visit: Payer: Self-pay | Admitting: Nurse Practitioner

## 2021-04-07 DIAGNOSIS — E1151 Type 2 diabetes mellitus with diabetic peripheral angiopathy without gangrene: Secondary | ICD-10-CM

## 2021-04-07 LAB — CBC WITH DIFFERENTIAL/PLATELET
Absolute Monocytes: 689 cells/uL (ref 200–950)
Basophils Absolute: 60 cells/uL (ref 0–200)
Basophils Relative: 0.7 %
Eosinophils Absolute: 230 cells/uL (ref 15–500)
Eosinophils Relative: 2.7 %
HCT: 28.2 % — ABNORMAL LOW (ref 35.0–45.0)
Hemoglobin: 9.4 g/dL — ABNORMAL LOW (ref 11.7–15.5)
Lymphs Abs: 2244 cells/uL (ref 850–3900)
MCH: 32.1 pg (ref 27.0–33.0)
MCHC: 33.3 g/dL (ref 32.0–36.0)
MCV: 96.2 fL (ref 80.0–100.0)
MPV: 10.2 fL (ref 7.5–12.5)
Monocytes Relative: 8.1 %
Neutro Abs: 5279 cells/uL (ref 1500–7800)
Neutrophils Relative %: 62.1 %
Platelets: 206 10*3/uL (ref 140–400)
RBC: 2.93 10*6/uL — ABNORMAL LOW (ref 3.80–5.10)
RDW: 12.4 % (ref 11.0–15.0)
Total Lymphocyte: 26.4 %
WBC: 8.5 10*3/uL (ref 3.8–10.8)

## 2021-04-07 LAB — BASIC METABOLIC PANEL WITH GFR
BUN/Creatinine Ratio: 13 (calc) (ref 6–22)
BUN: 47 mg/dL — ABNORMAL HIGH (ref 7–25)
CO2: 21 mmol/L (ref 20–32)
Calcium: 8.9 mg/dL (ref 8.6–10.4)
Chloride: 109 mmol/L (ref 98–110)
Creat: 3.66 mg/dL — ABNORMAL HIGH (ref 0.60–0.88)
GFR, Est African American: 13 mL/min/{1.73_m2} — ABNORMAL LOW (ref >=60)
GFR, Est Non African American: 11 mL/min/{1.73_m2} — ABNORMAL LOW (ref >=60)
Glucose, Bld: 150 mg/dL — ABNORMAL HIGH (ref 65–99)
Potassium: 4.1 mmol/L (ref 3.5–5.3)
Sodium: 140 mmol/L (ref 135–146)

## 2021-04-07 LAB — HEMOGLOBIN A1C
Hgb A1c MFr Bld: 7.9 % of total Hgb — ABNORMAL HIGH (ref <5.7)
Mean Plasma Glucose: 180 mg/dL
eAG (mmol/L): 10 mmol/L

## 2021-04-07 LAB — LIPID PANEL
Cholesterol: 164 mg/dL (ref <200)
HDL: 81 mg/dL (ref >=50)
LDL Cholesterol (Calc): 68 mg/dL (calc)
Non-HDL Cholesterol (Calc): 83 mg/dL (calc) (ref <130)
Total CHOL/HDL Ratio: 2 (calc) (ref <5.0)
Triglycerides: 71 mg/dL (ref <150)

## 2021-04-07 MED ORDER — LANTUS SOLOSTAR 100 UNIT/ML ~~LOC~~ SOPN: 26.0000 [IU] | PEN_INJECTOR | Freq: Every day | SUBCUTANEOUS | 3 refills | Status: DC

## 2021-04-08 ENCOUNTER — Other Ambulatory Visit: Payer: Self-pay

## 2021-04-08 ENCOUNTER — Encounter: Payer: Self-pay | Admitting: Family Medicine

## 2021-04-08 ENCOUNTER — Ambulatory Visit (INDEPENDENT_AMBULATORY_CARE_PROVIDER_SITE_OTHER): Payer: Medicare Other | Admitting: Family Medicine

## 2021-04-08 VITALS — BP 120/70 | HR 67 | Temp 97.5°F | Resp 20 | Ht 65.0 in | Wt 139.2 lb

## 2021-04-08 DIAGNOSIS — E1129 Type 2 diabetes mellitus with other diabetic kidney complication: Secondary | ICD-10-CM | POA: Diagnosis not present

## 2021-04-08 DIAGNOSIS — Z7689 Persons encountering health services in other specified circumstances: Secondary | ICD-10-CM | POA: Diagnosis not present

## 2021-04-08 DIAGNOSIS — I10 Essential (primary) hypertension: Secondary | ICD-10-CM

## 2021-04-08 DIAGNOSIS — E1165 Type 2 diabetes mellitus with hyperglycemia: Secondary | ICD-10-CM | POA: Diagnosis not present

## 2021-04-08 DIAGNOSIS — I739 Peripheral vascular disease, unspecified: Secondary | ICD-10-CM

## 2021-04-08 DIAGNOSIS — N182 Chronic kidney disease, stage 2 (mild): Secondary | ICD-10-CM | POA: Diagnosis not present

## 2021-04-08 DIAGNOSIS — IMO0002 Reserved for concepts with insufficient information to code with codable children: Secondary | ICD-10-CM

## 2021-04-08 NOTE — Patient Instructions (Signed)
Your diabetes control is better than it has been recently.  Keep up the good work with insulin dosing and diet Please keep follow-up appointments with nephrologist as they need to be involved with your care as your kidney function is declining some

## 2021-04-08 NOTE — Progress Notes (Signed)
Provider:  Alain Honey, MD  Careteam: Patient Care Team: Wardell Honour, MD as PCP - General (Family Medicine) Wellington Hampshire, MD as PCP - Cardiology (Cardiology) Adrian Prows, MD as Consulting Physician (Cardiology) Rutherford Guys, MD as Consulting Physician (Ophthalmology) Justin Mend, MD as Attending Physician (Internal Medicine) Justin Mend, MD as Attending Physician (Internal Medicine) [name], RN as [facility]  PLACE OF SERVICE:  [place] Directive information Does Patient Have a Medical Advance Directive?: Yes, Type of Advance Directive: Out of facility DNR (pink MOST or yellow form), Pre-existing out of facility DNR order (yellow form or pink MOST form): Yellow form placed in chart (order not valid for inpatient use), Does patient want to make changes to medical advance directive?: No - Patient declined  Allergies  Allergen Reactions  . Invokana [Canagliflozin] Itching, Swelling and Other (See Comments)    Vaginal itching, swelling and irritation  . Jardiance [Empagliflozin] Itching, Swelling and Other (See Comments)    Vaginal itching and swelling  . Tuberculin Ppd Other (See Comments)    Per records from [facility] Complaint  Patient presents with  . Medical Management of Chronic Issues    3 Month Follow Up     HPI: Patient is a 86 y.o. female seen today for chronic problems especially diabetes and chronic kidney disease and peripheral vascular disease.  She has seen the vascular surgeon recently and gotten good report there.  She does report some cramping in her left leg which is the leg bypass surgery was done.  She tells me she missed an appointment with nephrologist and I tried to explain the importance of seeing nephrologist to help manage her chronic kidney disease. We reviewed her labs today.  Her hemoglobin A1c has trended down over the last 3 visits and I praised her for this.   She continues on Lantus 26 units at nighttime along with 3 units of Humalog with meals.  She gets out of the house going to some exercise and group classes at [facility]. She denies chest pain shortness of breath.  Her hemoglobin has declined some and I anticipated some shortness of breath but she denies probably related to her activity level.  Review of Systems:  Review of Systems  Constitutional: Negative for malaise/fatigue.  HENT: Negative.   Eyes: Positive for blurred vision.  Respiratory: Negative.   Cardiovascular: Negative.   Gastrointestinal: Negative.   Genitourinary: Negative.   Musculoskeletal: Positive for myalgias.  Skin: Negative.   Neurological: Negative.   Psychiatric/Behavioral: Negative.   All other systems reviewed and are negative.   Past Medical History:  Diagnosis Date  . Acute upper respiratory infections of unspecified site   . Amputee 08/2019  . Anemia   . Anemia, unspecified   . Arthritis   . Atherosclerosis of native arteries of the extremities, unspecified   . Chest pain, unspecified   . Chronic kidney disease (CKD), stage II (mild)    [facility]  . Diabetes mellitus without complication ([place])    Type II  . Diarrhea   . Disorder of bone and cartilage, unspecified   . Herpes zoster with other nervous system complications(053.19)   . History of blood transfusion   . Hypercalcemia   . Hypertension   . Hypertensive renal disease, benign   . Nonspecific reaction to tuberculin skin test without active tuberculosis(795.51)   . Other and unspecified hyperlipidemia   . PAD (  peripheral artery disease) ([place])    Per records from [facility]  . Pain in joint, lower leg   . Peripheral arterial disease ([place])   . Postherpetic neuralgia   . Proteinuria   . Stroke ([place]) 01/2017  . Type II or unspecified type diabetes mellitus with renal manifestations, uncontrolled(250.42)   . Unspecified disorder of kidney and ureter    Past Surgical History:   Procedure Laterality Date  . ABDOMINAL AORTOGRAM W/LOWER EXTREMITY N/A 10/31/2019   Procedure: ABDOMINAL AORTOGRAM W/LOWER EXTREMITY;  Surgeon: Wellington Hampshire, MD;  Location: [place] [facility];  Service: Cardiovascular;  Laterality: N/A;  . ABDOMINAL AORTOGRAM W/LOWER EXTREMITY Left 05/12/2020   Procedure: ABDOMINAL AORTOGRAM W/LOWER EXTREMITY;  Surgeon: Waynetta Sandy, MD;  Location: [place] [facility];  Service: Cardiovascular;  Laterality: Left;  . AMPUTATION Right 11/02/2019   Procedure: AMPUTATION BELOW KNEE RIGHT;  Surgeon: Rosetta Posner, MD;  Location: [place];  Service: Vascular;  Laterality: Right;  . COLONOSCOPY     x 2  . ENDARTERECTOMY FEMORAL Left 05/21/2020   Procedure: LEFT COMMON FEMORAL ENDARTERECTOMY;  Surgeon: Rosetta Posner, MD;  Location: [place];  Service: Vascular;  Laterality: Left;  . FEMORAL-POPLITEAL BYPASS GRAFT Left 05/21/2020   Procedure: LEFT FEMORAL TO BELOW KNEE POPLITEAL ARTERY BYPASS GRAFT;  Surgeon: Rosetta Posner, MD;  Location: [facility];  Service: Vascular;  Laterality: Left;  . hysterectomy    . INCISION AND DRAINAGE Left 05/27/14   sebacous cyst, ear  . PRP Left    Dr. Ricki   . removal of cyst from hand    . removal of tumor from foot    . TONSILLECTOMY     Social History:   reports that she has quit smoking. Her smoking use included cigarettes. She has never used smokeless tobacco. She reports that she does not drink alcohol and does not use drugs.  Family History  Problem Relation Age of Onset  . Diabetes Mother   . Diabetes Father   . Diabetes Sister   . Diabetes Sister     Medications: Patient's Medications  New Prescriptions   No medications on file  Previous Medications   AMLODIPINE (NORVASC) 10 MG TABLET    Take 1 tablet (10 mg total) by mouth daily.   ASPIRIN LOW DOSE 81 MG EC TABLET    Take 81 mg by mouth daily.   ATORVASTATIN (LIPITOR) 40 MG TABLET    Take 1 tablet (40 mg total) by mouth daily.   CALCITRIOL (ROCALTROL)  0.25 MCG CAPSULE    Take 0.25 mcg by mouth every other day. Monday, Wednesday, and Friday.   CARVEDILOL (COREG) 25 MG TABLET    TAKE 1 TABLET BY MOUTH TWICE A DAY WITH MEALS   CLOPIDOGREL (PLAVIX) 75 MG TABLET    TAKE 1 TABLET BY MOUTH EVERY DAY   FEEDING SUPPLEMENT, GLUCERNA SHAKE, (GLUCERNA SHAKE) LIQD    Take 237 mLs by mouth as needed.   GLUCOSE 15 GM/32ML GEL    Use as needed for low blood sugars   INSULIN LISPRO (HUMALOG) 100 UNIT/ML INJECTION    Inject 0.03 mLs (3 Units total) into the skin 3 (three) times daily after meals.   INSULIN PEN NEEDLE (B-D ULTRAFINE III SHORT PEN) 31G X 8 MM MISC    Use to check blood sugar every day. Dx: 11.29; 11.65   LANTUS SOLOSTAR 100 UNIT/ML SOLOSTAR PEN    Inject 26 Units into the skin daily.   MULTIPLE VITAMIN (  MULTIVITAMIN WITH MINERALS) TABS TABLET    Take 1 tablet by mouth daily.   OLMESARTAN (BENICAR) 20 MG TABLET    Take 1 tablet (20 mg total) by mouth daily.   ONETOUCH VERIO TEST STRIP    USE TO TEST BLOOD SUGAR THREE TIMES DAILY. DX: E11.9   TORSEMIDE (DEMADEX) 20 MG TABLET    Take 20 mg by mouth daily.  Modified Medications   No medications on file  Discontinued Medications   No medications on file    Physical Exam:  Vitals:   04/08/21 0932  BP: 120/70  Pulse: 67  Resp: 20  Temp: (!) 97.5 F (36.4 C)  SpO2: 99%  Weight: 139 lb 3.2 oz (63.1 kg)  Height: 5\' 5"  (1.651 m)   Body mass index is 23.16 kg/m. Wt Readings from Last 3 Encounters:  04/08/21 139 lb 3.2 oz (63.1 kg)  03/17/21 138 lb 3.2 oz (62.7 kg)  01/08/21 138 lb (62.6 kg)    Physical Exam Vitals and nursing note reviewed.  Constitutional:      Appearance: Normal appearance.  HENT:     Nose: Nose normal.     Mouth/Throat:     Mouth: Mucous membranes are moist.  Eyes:     Extraocular Movements: Extraocular movements intact.     Pupils: Pupils are equal, round, and reactive to light.  Cardiovascular:     Rate and Rhythm: Normal rate and regular rhythm.   Pulmonary:     Effort: Pulmonary effort is normal.     Breath sounds: Normal breath sounds.  Abdominal:     General: Abdomen is flat.     Palpations: Abdomen is soft.  Musculoskeletal:     Comments: Right BKA  Skin:    General: Skin is warm and dry.  Neurological:     General: No focal deficit present.     Mental Status: She is alert and oriented to person, place, and time.  Psychiatric:        Mood and Affect: Mood normal.        Behavior: Behavior normal.        Thought Content: Thought content normal.        Judgment: Judgment normal.     Labs reviewed: Basic Metabolic Panel: Recent Labs    10/06/20 1213 01/05/21 1405 04/06/21 1216  NA 137 138 140  K 4.2 3.9 4.1  CL 106 108 109  CO2 24 24 21   GLUCOSE 270* 184* 150*  BUN 36* 36* 47*  CREATININE 2.32* 3.04* 3.66*  CALCIUM 9.7 9.0 8.9   Liver Function Tests: Recent Labs    05/21/20 0948 09/04/20 1202 01/05/21 1405  AST 20 16 16   ALT 22 11 13   ALKPHOS 84  --   --   BILITOT 0.4 0.5 0.3  PROT 7.0 6.6 5.2*  ALBUMIN 3.3*  --   --    No results for input(s): LIPASE, AMYLASE in the last 8760 hours. No results for input(s): AMMONIA in the last 8760 hours. CBC: Recent Labs    09/04/20 1202 01/05/21 1405 04/06/21 1216  WBC 8.9 8.1 8.5  NEUTROABS 5,474 4,982 5,279  HGB 11.8 10.2* 9.4*  HCT 35.1 30.0* 28.2*  MCV 93.1 92.3 96.2  PLT 231 228 206   Lipid Panel: Recent Labs    09/04/20 1202 01/05/21 1405 04/06/21 1216  CHOL 179 167 164  HDL 76 73 81  LDLCALC 88 78 68  TRIG 63 77 71  CHOLHDL 2.4 2.3 2.0  TSH: No results for input(s): TSH in the last 8760 hours. A1C: Lab Results  Component Value Date   HGBA1C 7.9 (H) 04/06/2021     Assessment/Plan  1. Essential hypertension, benign Blood pressures are well controlled at home carvedilol olmesartan and amlodipine.  Continue same  2. Peripheral arterial disease ([place]) This is followed by vascular surgery.  Review of recent office visit is  favorable  3. Type II diabetes mellitus with renal manifestations, uncontrolled (HCC) A1c is down to 7.9 from 9.  Continue insulin and diet as before  4. Chronic kidney disease (CKD), stage II (mild) Followed by nephrologist and again stressed importance of need to follow-up here.   Alain Honey, MD [facility] [phone]

## 2021-04-09 ENCOUNTER — Ambulatory Visit: Payer: Medicare Other | Admitting: Internal Medicine

## 2021-04-28 DIAGNOSIS — Z7689 Persons encountering health services in other specified circumstances: Secondary | ICD-10-CM | POA: Diagnosis not present

## 2021-04-28 DIAGNOSIS — N184 Chronic kidney disease, stage 4 (severe): Secondary | ICD-10-CM | POA: Diagnosis not present

## 2021-04-28 DIAGNOSIS — E1122 Type 2 diabetes mellitus with diabetic chronic kidney disease: Secondary | ICD-10-CM | POA: Diagnosis not present

## 2021-04-28 DIAGNOSIS — D649 Anemia, unspecified: Secondary | ICD-10-CM | POA: Diagnosis not present

## 2021-04-28 DIAGNOSIS — I129 Hypertensive chronic kidney disease with stage 1 through stage 4 chronic kidney disease, or unspecified chronic kidney disease: Secondary | ICD-10-CM | POA: Diagnosis not present

## 2021-04-28 DIAGNOSIS — R809 Proteinuria, unspecified: Secondary | ICD-10-CM | POA: Diagnosis not present

## 2021-06-05 ENCOUNTER — Other Ambulatory Visit: Payer: Self-pay

## 2021-06-05 DIAGNOSIS — IMO0002 Reserved for concepts with insufficient information to code with codable children: Secondary | ICD-10-CM

## 2021-06-05 MED ORDER — ONETOUCH VERIO VI STRP: ORAL_STRIP | 3 refills | Status: AC

## 2021-06-10 ENCOUNTER — Other Ambulatory Visit: Payer: Self-pay | Admitting: *Deleted

## 2021-06-10 MED ORDER — ATORVASTATIN CALCIUM 40 MG PO TABS: 40.0000 mg | ORAL_TABLET | Freq: Every day | ORAL | 1 refills | Status: DC

## 2021-06-10 NOTE — Telephone Encounter (Signed)
Received refill Request from [facility]

## 2021-06-16 DIAGNOSIS — N184 Chronic kidney disease, stage 4 (severe): Secondary | ICD-10-CM | POA: Diagnosis not present

## 2021-06-23 DIAGNOSIS — I120 Hypertensive chronic kidney disease with stage 5 chronic kidney disease or end stage renal disease: Secondary | ICD-10-CM | POA: Diagnosis not present

## 2021-06-23 DIAGNOSIS — N185 Chronic kidney disease, stage 5: Secondary | ICD-10-CM | POA: Diagnosis not present

## 2021-06-23 DIAGNOSIS — R809 Proteinuria, unspecified: Secondary | ICD-10-CM | POA: Diagnosis not present

## 2021-06-23 DIAGNOSIS — Z7689 Persons encountering health services in other specified circumstances: Secondary | ICD-10-CM | POA: Diagnosis not present

## 2021-06-23 DIAGNOSIS — E1122 Type 2 diabetes mellitus with diabetic chronic kidney disease: Secondary | ICD-10-CM | POA: Diagnosis not present

## 2021-06-23 DIAGNOSIS — R194 Change in bowel habit: Secondary | ICD-10-CM | POA: Diagnosis not present

## 2021-06-23 DIAGNOSIS — D649 Anemia, unspecified: Secondary | ICD-10-CM | POA: Diagnosis not present

## 2021-06-26 ENCOUNTER — Ambulatory Visit: Payer: Medicare Other | Admitting: *Deleted

## 2021-06-30 ENCOUNTER — Ambulatory Visit: Payer: Self-pay | Admitting: *Deleted

## 2021-07-02 ENCOUNTER — Other Ambulatory Visit: Payer: Self-pay | Admitting: *Deleted

## 2021-07-02 NOTE — Patient Outreach (Signed)
[name]) Care Management  [place]  07/02/2021   Monique Cabrera 12-19-1935 [id number]   Outgoing call placed to member, state she is "pretty good."  Report she was seen by kidney specialist, state all of her lab work have remained stable and will not have to follow up for another 3 months.  Denies any urgent concerns, encouraged to contact this care manager with questions.    Encounter Medications:  Outpatient Encounter Medications as of 07/02/2021  Medication Sig   amLODipine (NORVASC) 10 MG tablet Take 1 tablet (10 mg total) by mouth daily.   ASPIRIN LOW DOSE 81 MG EC tablet Take 81 mg by mouth daily.   atorvastatin (LIPITOR) 40 MG tablet Take 1 tablet (40 mg total) by mouth daily.   calcitRIOL (ROCALTROL) 0.25 MCG capsule Take 0.25 mcg by mouth every other day. Monday, Wednesday, and Friday.   carvedilol (COREG) 25 MG tablet TAKE 1 TABLET BY MOUTH TWICE A DAY WITH MEALS   clopidogrel (PLAVIX) 75 MG tablet TAKE 1 TABLET BY MOUTH EVERY DAY   feeding supplement, GLUCERNA SHAKE, (GLUCERNA SHAKE) LIQD Take 237 mLs by mouth as needed.   Glucose 15 GM/32ML GEL Use as needed for low blood sugars   glucose blood (ONETOUCH VERIO) test strip USE TO TEST BLOOD SUGAR THREE TIMES DAILY. DX: E11.9   insulin lispro (HUMALOG) 100 UNIT/ML injection Inject 0.03 mLs (3 Units total) into the skin 3 (three) times daily after meals.   Insulin Pen Needle (B-D ULTRAFINE III SHORT PEN) 31G X 8 MM MISC Use to check blood sugar every day. Dx: 11.29; 11.65   LANTUS SOLOSTAR 100 UNIT/ML Solostar Pen Inject 26 Units into the skin daily.   Multiple Vitamin (MULTIVITAMIN WITH MINERALS) TABS tablet Take 1 tablet by mouth daily.   olmesartan (BENICAR) 20 MG tablet Take 1 tablet (20 mg total) by mouth daily.   torsemide (DEMADEX) 20 MG tablet Take 20 mg by mouth daily.   No facility-administered encounter medications on file as of 07/02/2021.    Functional Status:  In your present state  of health, do you have any difficulty performing the following activities: 03/04/2021  Hearing? N  Vision? N  Difficulty concentrating or making decisions? Y  Comment Remembering  Walking or climbing stairs? Y  Comment uses a cane  Dressing or bathing? N  Doing errands, shopping? Y  Comment [place] and eating ? N  Using the Toilet? N  In the past six months, have you accidently leaked urine? N  Do you have problems with loss of bowel control? N  Managing your Medications? N  Managing your Finances? N  Housekeeping or managing your Housekeeping? N  Some recent data might be hidden    Fall/Depression Screening: Fall Risk  04/08/2021 03/19/2021 03/04/2021  Falls in the past year? 1 1 1   Number falls in past yr: 0 0 0  Comment - - -  Injury with Fall? 0 0 0  Risk for fall due to : - History of fall(s) -  Follow up - Falls prevention discussed -   PHQ 2/9 Scores 04/08/2021 03/19/2021 03/04/2021 09/04/2020 04/03/2020 03/06/2020 01/24/2020  PHQ - 2 Score 0 0 0 0 0 0 0  PHQ- 9 Score - - - - - - -    Assessment:   Care Plan Care Plan : Hypertension (Adult)  Updates made by [name], RN since 07/02/2021 12:00 AM     Problem: Disease Progression (Hypertension)  Long-Range Goal: Disease Progression Prevented or Minimized as evidenced by member monitoring and recording blood pressure daily over the next 90 days   Start Date: 03/19/2021  Expected End Date: 06/17/2021  This Visit's Progress: On track  Priority: High  Note:   Tailored lifestyle advice to individual; review progress regularly; give frequent encouragement and respond positively to incremental successes.   Assessed for and promote awareness of worsening disease or development of comorbidity.      Problem: Resistant Hypertension (Hypertension)      Long-Range Goal: Response to Treatment Maximized as evidenced by member reporting blood pressure within goal (140/90 or less consistently) within the  next 90 days   Start Date: 03/19/2021  Expected End Date: 06/17/2021  This Visit's Progress: On track  Priority: Medium  Note:   Assessed patient response to treatment, including presence or absence of medication side effects, degree of blood pressure control and patient satisfaction.   Assessed technique (including cuff size and placement), measurement times, condition and calibration of blood pressure cuff set (both at-home and in-office equipment).        Goals Addressed             This Visit's Progress    [facility] - Make and Keep All Appointments   On track    Timeframe:  Long-Range Goal Priority:  High Start Date:            3/24                 Expected End Date:   9/24                Barriers: Transportation    - ask family or friend for a ride - call to cancel if needed    Why is this important?   Part of staying healthy is seeing the doctor for follow-up care.  If you forget your appointments, there are some things you can do to stay on track.    Notes:   7/7 - Was seen by PCP a couple weeks ago, denies any concern, will follow up in September.        [facility] - Track and Manage My Blood Pressure-Hypertension   On track    Timeframe:  Long-Range Goal Priority:  Medium Start Date:       3/24                      Expected End Date:   9/24                    Barriers: Knowledge    - check blood pressure daily    Why is this important?   You won't feel high blood pressure, but it can still hurt your blood vessels.  High blood pressure can cause heart or kidney problems. It can also cause a stroke.  Making lifestyle changes like losing a little weight or eating less salt will help.  Checking your blood pressure at home and at different times of the day can help to control blood pressure.  If the doctor prescribes medicine remember to take it the way the doctor ordered.  Call the office if you cannot afford the medicine or if there are questions about it.      Notes:   7/7 - Report blood pressure has been good, denies any headache or other symptoms of HTN.  Also report blood sugars are doing well, A1C decreased to 7.5  Plan:  Follow-up: Patient agrees to Care Plan and Follow-up. Follow-up in 3 month(s).  [name], [place], MSN [place] [phone]

## 2021-08-12 ENCOUNTER — Ambulatory Visit (INDEPENDENT_AMBULATORY_CARE_PROVIDER_SITE_OTHER): Payer: Medicare Other | Admitting: Family Medicine

## 2021-08-12 ENCOUNTER — Encounter: Payer: Self-pay | Admitting: Family Medicine

## 2021-08-12 ENCOUNTER — Other Ambulatory Visit: Payer: Self-pay

## 2021-08-12 VITALS — BP 150/76 | HR 74 | Temp 97.9°F | Ht 65.0 in | Wt 143.0 lb

## 2021-08-12 DIAGNOSIS — N189 Chronic kidney disease, unspecified: Secondary | ICD-10-CM | POA: Diagnosis not present

## 2021-08-12 DIAGNOSIS — E7800 Pure hypercholesterolemia, unspecified: Secondary | ICD-10-CM | POA: Diagnosis not present

## 2021-08-12 DIAGNOSIS — E1129 Type 2 diabetes mellitus with other diabetic kidney complication: Secondary | ICD-10-CM

## 2021-08-12 DIAGNOSIS — N179 Acute kidney failure, unspecified: Secondary | ICD-10-CM | POA: Diagnosis not present

## 2021-08-12 DIAGNOSIS — Z89511 Acquired absence of right leg below knee: Secondary | ICD-10-CM | POA: Diagnosis not present

## 2021-08-12 DIAGNOSIS — E1165 Type 2 diabetes mellitus with hyperglycemia: Secondary | ICD-10-CM

## 2021-08-12 DIAGNOSIS — I10 Essential (primary) hypertension: Secondary | ICD-10-CM | POA: Diagnosis not present

## 2021-08-12 DIAGNOSIS — IMO0002 Reserved for concepts with insufficient information to code with codable children: Secondary | ICD-10-CM

## 2021-08-12 NOTE — Progress Notes (Signed)
Provider:  Alain Honey, MD  Careteam: Patient Care Team: Wardell Honour, MD as PCP - General (Family Medicine) Wellington Hampshire, MD as PCP - Cardiology (Cardiology) Adrian Prows, MD as Consulting Physician (Cardiology) Rutherford Guys, MD as Consulting Physician (Ophthalmology) Justin Mend, MD as Attending Physician (Internal Medicine) Justin Mend, MD as Attending Physician (Internal Medicine) [name], RN as [place] Management  PLACE OF SERVICE:  [place] Directive information    Allergies  Allergen Reactions   Invokana [Canagliflozin] Itching, Swelling and Other (See Comments)    Vaginal itching, swelling and irritation   Jardiance [Empagliflozin] Itching, Swelling and Other (See Comments)    Vaginal itching and swelling   Tuberculin Ppd Other (See Comments)    Per records from [facility] Complaint  Patient presents with   Medical Management of Chronic Issues    Medical Management of Chronic Issues. 4 Month Follow up     HPI: Patient is a 86 y.o. female Routine F/U for management DM, BP, and lipids.  Doing well by hx. Wonders if she has heart issues since she has noted that referenced on paperwork. Reviewed most recent echo with EF >60%  Review of Systems:  Review of Systems  Constitutional: Negative.   Eyes: Negative.   Respiratory: Negative.    Cardiovascular: Negative.   Gastrointestinal: Negative.   Genitourinary: Negative.   Neurological:        Describes some pains like phantom  Psychiatric/Behavioral: Negative.    All other systems reviewed and are negative.  Past Medical History:  Diagnosis Date   Acute upper respiratory infections of unspecified site    Amputee 08/2019   Anemia    Anemia, unspecified    Arthritis    Atherosclerosis of native arteries of the extremities, unspecified    Chest pain, unspecified    Chronic kidney disease (CKD), stage II (mild)    [facility]    Diabetes mellitus without complication (HCC)    Type II   Diarrhea    Disorder of bone and cartilage, unspecified    Herpes zoster with other nervous system complications(053.19)    History of blood transfusion    Hypercalcemia    Hypertension    Hypertensive renal disease, benign    Nonspecific reaction to tuberculin skin test without active tuberculosis(795.51)    Other and unspecified hyperlipidemia    PAD (peripheral artery disease) ([place])    Per records from [name]   Pain in joint, lower leg    Peripheral arterial disease ([place])    Postherpetic neuralgia    Proteinuria    Stroke ([place]) 01/2017   Type II or unspecified type diabetes mellitus with renal manifestations, uncontrolled(250.42)    Unspecified disorder of kidney and ureter    Past Surgical History:  Procedure Laterality Date   ABDOMINAL AORTOGRAM W/LOWER EXTREMITY N/A 10/31/2019   Procedure: ABDOMINAL AORTOGRAM W/LOWER EXTREMITY;  Surgeon: Wellington Hampshire, MD;  Location: [place] [facility];  Service: Cardiovascular;  Laterality: N/A;   ABDOMINAL AORTOGRAM W/LOWER EXTREMITY Left 05/12/2020   Procedure: ABDOMINAL AORTOGRAM W/LOWER EXTREMITY;  Surgeon: Waynetta Sandy, MD;  Location: [place] [facility];  Service: Cardiovascular;  Laterality: Left;   AMPUTATION Right 11/02/2019   Procedure: AMPUTATION BELOW KNEE RIGHT;  Surgeon: Rosetta Posner, MD;  [facility];  Service: Vascular;  Laterality: Right;   COLONOSCOPY     x 2   ENDARTERECTOMY FEMORAL Left  05/21/2020   Procedure: LEFT COMMON FEMORAL ENDARTERECTOMY;  Surgeon: Rosetta Posner, MD;  Location: [facility];  Service: Vascular;  Laterality: Left;   FEMORAL-POPLITEAL BYPASS GRAFT Left 05/21/2020   Procedure: LEFT FEMORAL TO BELOW KNEE POPLITEAL ARTERY BYPASS GRAFT;  Surgeon: Rosetta Posner, MD;  [facility];  Service: Vascular;  Laterality: Left;   hysterectomy     INCISION AND DRAINAGE Left 05/27/14   sebacous cyst, ear   PRP Left    Dr. Ricki     removal of cyst from hand     removal of tumor from foot     TONSILLECTOMY     Social History:   reports that she has quit smoking. Her smoking use included cigarettes. She has never used smokeless tobacco. She reports that she does not drink alcohol and does not use drugs.  Family History  Problem Relation Age of Onset   Diabetes Mother    Diabetes Father    Diabetes Sister    Diabetes Sister     Medications: Patient's Medications  New Prescriptions   No medications on file  Previous Medications   AMLODIPINE (NORVASC) 10 MG TABLET    Take 1 tablet (10 mg total) by mouth daily.   ASPIRIN LOW DOSE 81 MG EC TABLET    Take 81 mg by mouth daily.   ATORVASTATIN (LIPITOR) 40 MG TABLET    Take 1 tablet (40 mg total) by mouth daily.   CALCITRIOL (ROCALTROL) 0.25 MCG CAPSULE    Take 0.25 mcg by mouth every other day. Monday, Wednesday, and Friday.   CARVEDILOL (COREG) 25 MG TABLET    TAKE 1 TABLET BY MOUTH TWICE A DAY WITH MEALS   CLOPIDOGREL (PLAVIX) 75 MG TABLET    TAKE 1 TABLET BY MOUTH EVERY DAY   FEEDING SUPPLEMENT, GLUCERNA SHAKE, (GLUCERNA SHAKE) LIQD    Take 237 mLs by mouth as needed.   GLUCOSE 15 GM/32ML GEL    Use as needed for low blood sugars   GLUCOSE BLOOD (ONETOUCH VERIO) TEST STRIP    USE TO TEST BLOOD SUGAR THREE TIMES DAILY. DX: E11.9   INSULIN LISPRO (HUMALOG) 100 UNIT/ML INJECTION    Inject 0.03 mLs (3 Units total) into the skin 3 (three) times daily after meals.   INSULIN PEN NEEDLE (B-D ULTRAFINE III SHORT PEN) 31G X 8 MM MISC    Use to check blood sugar every day. Dx: 11.29; 11.65   LANTUS SOLOSTAR 100 UNIT/ML SOLOSTAR PEN    Inject 26 Units into the skin daily.   MULTIPLE VITAMIN (MULTIVITAMIN WITH MINERALS) TABS TABLET    Take 1 tablet by mouth daily.   OLMESARTAN (BENICAR) 20 MG TABLET    Take 1 tablet (20 mg total) by mouth daily.   TORSEMIDE (DEMADEX) 20 MG TABLET    Take 20 mg by mouth daily.  Modified Medications   No medications on file  Discontinued  Medications   No medications on file    Physical Exam:  Vitals:   08/12/21 1027  BP: (!) 150/76  Pulse: 74  Temp: 97.9 F (36.6 C)  SpO2: 98%  Weight: 143 lb (64.9 kg)  Height: 5\' 5"  (1.651 m)   Body mass index is 23.8 kg/m. Wt Readings from Last 3 Encounters:  08/12/21 143 lb (64.9 kg)  04/08/21 139 lb 3.2 oz (63.1 kg)  03/17/21 138 lb 3.2 oz (62.7 kg)    Physical Exam Vitals and nursing note reviewed.  Constitutional:      Appearance:  Normal appearance.  Cardiovascular:     Rate and Rhythm: Normal rate and regular rhythm.     Heart sounds: Normal heart sounds.  Pulmonary:     Effort: Pulmonary effort is normal.     Breath sounds: Normal breath sounds.  Skin:    General: Skin is warm and dry.  Neurological:     General: No focal deficit present.     Mental Status: She is alert.    Labs reviewed: Basic Metabolic Panel: Recent Labs    10/06/20 1213 01/05/21 1405 04/06/21 1216  NA 137 138 140  K 4.2 3.9 4.1  CL 106 108 109  CO2 24 24 21   GLUCOSE 270* 184* 150*  BUN 36* 36* 47*  CREATININE 2.32* 3.04* 3.66*  CALCIUM 9.7 9.0 8.9   Liver Function Tests: Recent Labs    09/04/20 1202 01/05/21 1405  AST 16 16  ALT 11 13  BILITOT 0.5 0.3  PROT 6.6 5.2*   No results for input(s): LIPASE, AMYLASE in the last 8760 hours. No results for input(s): AMMONIA in the last 8760 hours. CBC: Recent Labs    09/04/20 1202 01/05/21 1405 04/06/21 1216  WBC 8.9 8.1 8.5  NEUTROABS 5,474 4,982 5,279  HGB 11.8 10.2* 9.4*  HCT 35.1 30.0* 28.2*  MCV 93.1 92.3 96.2  PLT 231 228 206   Lipid Panel: Recent Labs    09/04/20 1202 01/05/21 1405 04/06/21 1216  CHOL 179 167 164  HDL 76 73 81  LDLCALC 88 78 68  TRIG 63 77 71  CHOLHDL 2.4 2.3 2.0   TSH: No results for input(s): TSH in the last 8760 hours. A1C: Lab Results  Component Value Date   HGBA1C 7.9 (H) 04/06/2021     Assessment/Plan  1. Type II diabetes mellitus with renal manifestations,  uncontrolled ([place]) Most recent A1C have been improved. Check today - Hemoglobin A1c  2. Acute kidney injury superimposed on CKD (HCC) GFR less than 20; followed by renal  3. Essential hypertension, benign BP okay on multi-drug regimen  4. Pure hypercholesterolemia LDL at goal checked 4 months ago  5. Status post below-knee amputation of right lower extremity ([place]) Followed by vascular surgeons   Alain Honey, MD [place] [phone]

## 2021-08-13 LAB — HEMOGLOBIN A1C
Hgb A1c MFr Bld: 7.1 %{Hb} — ABNORMAL HIGH
Mean Plasma Glucose: 157 mg/dL
eAG (mmol/L): 8.7 mmol/L

## 2021-08-24 ENCOUNTER — Other Ambulatory Visit: Payer: Medicare Other

## 2021-09-09 ENCOUNTER — Other Ambulatory Visit: Payer: Self-pay | Admitting: *Deleted

## 2021-09-09 MED ORDER — CLOPIDOGREL BISULFATE 75 MG PO TABS: 75.0000 mg | ORAL_TABLET | Freq: Every day | ORAL | 1 refills | Status: AC

## 2021-09-09 NOTE — Telephone Encounter (Signed)
[facility] [address] requested refill.  ?

## 2021-09-14 DIAGNOSIS — N185 Chronic kidney disease, stage 5: Secondary | ICD-10-CM | POA: Diagnosis not present

## 2021-09-15 ENCOUNTER — Ambulatory Visit (HOSPITAL_COMMUNITY)
Admission: RE | Admit: 2021-09-15 | Discharge: 2021-09-15 | Disposition: A | Discharge status: Home / Self Care | Payer: Medicare Other | Source: Ambulatory Visit | Attending: Vascular Surgery | Admitting: Vascular Surgery

## 2021-09-15 ENCOUNTER — Ambulatory Visit (INDEPENDENT_AMBULATORY_CARE_PROVIDER_SITE_OTHER)
Admission: RE | Admit: 2021-09-15 | Discharge: 2021-09-15 | Disposition: A | Discharge status: Home / Self Care | Payer: Medicare Other | Source: Ambulatory Visit | Attending: Vascular Surgery | Admitting: Vascular Surgery

## 2021-09-15 ENCOUNTER — Other Ambulatory Visit: Payer: Self-pay

## 2021-09-15 ENCOUNTER — Ambulatory Visit (INDEPENDENT_AMBULATORY_CARE_PROVIDER_SITE_OTHER): Payer: Medicare Other | Admitting: Physician Assistant

## 2021-09-15 VITALS — BP 174/86 | HR 64 | Temp 97.6°F | Resp 20 | Ht 65.0 in | Wt 142.6 lb

## 2021-09-15 DIAGNOSIS — I739 Peripheral vascular disease, unspecified: Secondary | ICD-10-CM

## 2021-09-15 DIAGNOSIS — Z89511 Acquired absence of right leg below knee: Secondary | ICD-10-CM | POA: Diagnosis not present

## 2021-09-15 NOTE — Progress Notes (Signed)
Office Note     CC:  follow up Requesting Provider:  Wardell Honour, MD  HPI: Monique Cabrera is a 86 y.o. (02-20-1935) female who presents for surveillance of PAD and left lower extremity bypass graft.  She has history of right BKA in November 2020 by Dr. Donnetta Hutching.  She also underwent left femoral endarterectomy and femoral to below the knee popliteal artery bypass with PTFE for critical limb ischemia by Dr. Donnetta Hutching on 05/21/2020.  She still has pain in her left foot that comes and goes however she attributes this to history of fracturing a bone in her foot that never really healed.  She is ambulatory with a prosthetic and a cane.  She does not have any wounds of her left foot.  She is mainly concerned about recent onset of shortness of breath with minimal activity.  She has plans to contact her PCP for further work-up.  She denies tobacco use.  She is on aspirin and a statin daily.  Past medical history also significant for insulin-dependent diabetes mellitus.   Past Medical History:  Diagnosis Date   Acute upper respiratory infections of unspecified site    Amputee 08/2019   Anemia    Anemia, unspecified    Arthritis    Atherosclerosis of native arteries of the extremities, unspecified    Chest pain, unspecified    Chronic kidney disease (CKD), stage II (mild)    [facility]   Diabetes mellitus without complication (HCC)    Type II   Diarrhea    Disorder of bone and cartilage, unspecified    Herpes zoster with other nervous system complications(053.19)    History of blood transfusion    Hypercalcemia    Hypertension    Hypertensive renal disease, benign    Nonspecific reaction to tuberculin skin test without active tuberculosis(795.51)    Other and unspecified hyperlipidemia    PAD (peripheral artery disease) ([place])    [facility]   Pain in joint, lower leg    Peripheral arterial disease ([place])    Postherpetic neuralgia    Proteinuria    Stroke ([place])  01/2017   Type II or unspecified type diabetes mellitus with renal manifestations, uncontrolled(250.42)    Unspecified disorder of kidney and ureter     Past Surgical History:  Procedure Laterality Date   ABDOMINAL AORTOGRAM W/LOWER EXTREMITY N/A 10/31/2019   Procedure: ABDOMINAL AORTOGRAM W/LOWER EXTREMITY;  Surgeon: Wellington Hampshire, MD;  Location: [place] [facility];  Service: Cardiovascular;  Laterality: N/A;   ABDOMINAL AORTOGRAM W/LOWER EXTREMITY Left 05/12/2020   Procedure: ABDOMINAL AORTOGRAM W/LOWER EXTREMITY;  Surgeon: Waynetta Sandy, MD;  Location: [place] [facility];  Service: Cardiovascular;  Laterality: Left;   AMPUTATION Right 11/02/2019   Procedure: AMPUTATION BELOW KNEE RIGHT;  Surgeon: Rosetta Posner, MD;  Location: [facility];  Service: Vascular;  Laterality: Right;   COLONOSCOPY     x 2   ENDARTERECTOMY FEMORAL Left 05/21/2020   Procedure: LEFT COMMON FEMORAL ENDARTERECTOMY;  Surgeon: Rosetta Posner, MD;  [facility];  Service: Vascular;  Laterality: Left;   FEMORAL-POPLITEAL BYPASS GRAFT Left 05/21/2020   Procedure: LEFT FEMORAL TO BELOW KNEE POPLITEAL ARTERY BYPASS GRAFT;  Surgeon: Rosetta Posner, MD;  [facility];  Service: Vascular;  Laterality: Left;   hysterectomy     INCISION AND DRAINAGE Left 05/27/14   sebacous cyst, ear   PRP Left    Dr. Ricki Miller   removal of cyst from  hand     removal of tumor from foot     TONSILLECTOMY      Social History   Socioeconomic History   Marital status: Widowed    Spouse name: Not on file   Number of children: 6   Years of education: 15   Highest education level: Not on file  Occupational History    Comment: retired, [employer]  Tobacco Use   Smoking status: Former    Types: Cigarettes   Smokeless tobacco: Never   Tobacco comments:    Quit about age 59  " it was really a habit"  Vaping Use   Vaping Use: Never used  Substance and Sexual Activity   Alcohol use: No    Alcohol/week: 0.0 standard drinks   Drug  use: No   Sexual activity: Never  Other Topics Concern   Not on file  Social History Narrative   Has moved in with her daughter.   Caffeine- coffee 2-3 cups daily, soda off and on   Social Determinants of Health   Financial Resource Strain: Not on file  Food Insecurity: No Food Insecurity   Worried About Charity fundraiser in the Last Year: Never true   Ran Out of Food in the Last Year: Never true  Transportation Needs: No Transportation Needs   Lack of Transportation (Medical): No   Lack of Transportation (Non-Medical): No  Physical Activity: Not on file  Stress: Not on file  Social Connections: Not on file  Intimate Partner Violence: Not on file    Family History  Problem Relation Age of Onset   Diabetes Mother    Diabetes Father    Diabetes Sister    Diabetes Sister     Current Outpatient Medications  Medication Sig Dispense Refill   amLODipine (NORVASC) 10 MG tablet Take 1 tablet (10 mg total) by mouth daily. 90 tablet 2   ASPIRIN LOW DOSE 81 MG EC tablet Take 81 mg by mouth daily.     atorvastatin (LIPITOR) 40 MG tablet Take 1 tablet (40 mg total) by mouth daily. 90 tablet 1   calcitRIOL (ROCALTROL) 0.25 MCG capsule Take 0.25 mcg by mouth every other day. Monday, Wednesday, and Friday.     carvedilol (COREG) 25 MG tablet TAKE 1 TABLET BY MOUTH TWICE A DAY WITH MEALS 180 tablet 1   clopidogrel (PLAVIX) 75 MG tablet Take 1 tablet (75 mg total) by mouth daily. 90 tablet 1   feeding supplement, GLUCERNA SHAKE, (GLUCERNA SHAKE) LIQD Take 237 mLs by mouth as needed.     Glucose 15 GM/32ML GEL Use as needed for low blood sugars 32 mL 5   glucose blood (ONETOUCH VERIO) test strip USE TO TEST BLOOD SUGAR THREE TIMES DAILY. DX: E11.9 300 strip 3   insulin lispro (HUMALOG) 100 UNIT/ML injection Inject 0.03 mLs (3 Units total) into the skin 3 (three) times daily after meals. 10 mL 11   Insulin Pen Needle (B-D ULTRAFINE III SHORT PEN) 31G X 8 MM MISC Use to check blood sugar every  day. Dx: 11.29; 11.65 100 each 1   LANTUS SOLOSTAR 100 UNIT/ML Solostar Pen Inject 26 Units into the skin daily. 15 mL 3   Multiple Vitamin (MULTIVITAMIN WITH MINERALS) TABS tablet Take 1 tablet by mouth daily.     olmesartan (BENICAR) 20 MG tablet Take 1 tablet (20 mg total) by mouth daily. 90 tablet 3   torsemide (DEMADEX) 20 MG tablet Take 20 mg by mouth daily.  No current facility-administered medications for this visit.    Allergies  Allergen Reactions   Invokana [Canagliflozin] Itching, Swelling and Other (See Comments)    Vaginal itching, swelling and irritation   Jardiance [Empagliflozin] Itching, Swelling and Other (See Comments)    Vaginal itching and swelling   Tuberculin Ppd Other (See Comments)    Per records from [place]:   [X]  denotes positive finding, [ ]  denotes negative finding Cardiac  Comments:  Chest pain or chest pressure:    Shortness of breath upon exertion:    Short of breath when lying flat:    Irregular heart rhythm:        Vascular    Pain in calf, thigh, or hip brought on by ambulation:    Pain in feet at night that wakes you up from your sleep:     Blood clot in your veins:    Leg swelling:         Pulmonary    Oxygen at home:    Productive cough:     Wheezing:         Neurologic    Sudden weakness in arms or legs:     Sudden numbness in arms or legs:     Sudden onset of difficulty speaking or slurred speech:    Temporary loss of vision in one eye:     Problems with dizziness:         Gastrointestinal    Blood in stool:     Vomited blood:         Genitourinary    Burning when urinating:     Blood in urine:        Psychiatric    Major depression:         Hematologic    Bleeding problems:    Problems with blood clotting too easily:        Skin    Rashes or ulcers:        Constitutional    Fever or chills:      PHYSICAL EXAMINATION:  Vitals:   09/15/21 1457  BP: (!) 174/86  Pulse: 64   Resp: 20  Temp: 97.6 F (36.4 C)  TempSrc: Temporal  SpO2: 100%  Weight: 142 lb 9.6 oz (64.7 kg)  Height: 5\' 5"  (1.651 m)    General:  WDWN in NAD; vital signs documented above Gait: Not observed HENT: WNL, normocephalic Pulmonary: normal non-labored breathing , without Rales, rhonchi,  wheezing Cardiac: regular HR Abdomen: soft, NT, no masses Skin: without rashes Vascular Exam/Pulses:  Right Left  Radial 2+ (normal) 2+ (normal)  DP BKA absent  PT BKA absent   Extremities: without ischemic changes, without Gangrene , without cellulitis; without open wounds;  Musculoskeletal: no muscle wasting or atrophy  Neurologic: A&O X 3;  No focal weakness or paresthesias are detected Psychiatric:  The pt has Normal affect.   Non-Invasive Vascular Imaging:   Widely patent left femoropopliteal bypass without any areas of hemodynamically significant stenosis Tibial vessels are noncompressible.  Left TBI of 0.37    ASSESSMENT/PLAN:: 86 y.o. female here for follow up for surveillance of left lower extremity bypass graft  -Arterial duplex demonstrates a widely patent left femoropopliteal bypass graft without any areas of hemodynamically significant stenosis. -Patient is ambulatory with a right leg prosthetic and a cane and I have encouraged her to continue her level of mobility -Continue aspirin and statin daily -Recheck left ABI and arterial  duplex in 1 year per protocol   Dagoberto Ligas, PA-C [facility] [phone]  Clinic MD:   Stanford Breed

## 2021-09-28 ENCOUNTER — Other Ambulatory Visit: Payer: Self-pay | Admitting: *Deleted

## 2021-09-28 DIAGNOSIS — I10 Essential (primary) hypertension: Secondary | ICD-10-CM

## 2021-09-28 NOTE — Patient Outreach (Signed)
[place] [facility]) Care Management  [place]  09/28/2021   Monique Cabrera September 16, 1935 [id number]   Outgoing call placed to member, state she is well.  Denies any urgent concerns, encouraged to contact this care manager with questions.   Encounter Medications:  Outpatient Encounter Medications as of 09/28/2021  Medication Sig   amLODipine (NORVASC) 10 MG tablet Take 1 tablet (10 mg total) by mouth daily.   ASPIRIN LOW DOSE 81 MG EC tablet Take 81 mg by mouth daily.   atorvastatin (LIPITOR) 40 MG tablet Take 1 tablet (40 mg total) by mouth daily.   calcitRIOL (ROCALTROL) 0.25 MCG capsule Take 0.25 mcg by mouth every other day. Monday, Wednesday, and Friday.   carvedilol (COREG) 25 MG tablet TAKE 1 TABLET BY MOUTH TWICE A DAY WITH MEALS   clopidogrel (PLAVIX) 75 MG tablet Take 1 tablet (75 mg total) by mouth daily.   feeding supplement, GLUCERNA SHAKE, (GLUCERNA SHAKE) LIQD Take 237 mLs by mouth as needed.   Glucose 15 GM/32ML GEL Use as needed for low blood sugars   glucose blood (ONETOUCH VERIO) test strip USE TO TEST BLOOD SUGAR THREE TIMES DAILY. DX: E11.9   insulin lispro (HUMALOG) 100 UNIT/ML injection Inject 0.03 mLs (3 Units total) into the skin 3 (three) times daily after meals.   Insulin Pen Needle (B-D ULTRAFINE III SHORT PEN) 31G X 8 MM MISC Use to check blood sugar every day. Dx: 11.29; 11.65   LANTUS SOLOSTAR 100 UNIT/ML Solostar Pen Inject 26 Units into the skin daily.   Multiple Vitamin (MULTIVITAMIN WITH MINERALS) TABS tablet Take 1 tablet by mouth daily.   olmesartan (BENICAR) 20 MG tablet Take 1 tablet (20 mg total) by mouth daily.   torsemide (DEMADEX) 20 MG tablet Take 20 mg by mouth daily.   No facility-administered encounter medications on file as of 09/28/2021.    Functional Status:  In your present state of health, do you have any difficulty performing the following activities: 03/04/2021  Hearing? N  Vision? N  Difficulty concentrating or  making decisions? Y  Comment Remembering  Walking or climbing stairs? Y  Comment uses a cane  Dressing or bathing? N  Doing errands, shopping? Y  Comment [place] and eating ? N  Using the Toilet? N  In the past six months, have you accidently leaked urine? N  Do you have problems with loss of bowel control? N  Managing your Medications? N  Managing your Finances? N  Housekeeping or managing your Housekeeping? N  Some recent data might be hidden    Fall/Depression Screening: Fall Risk  04/08/2021 03/19/2021 03/04/2021  Falls in the past year? 1 1 1   Number falls in past yr: 0 0 0  Comment - - -  Injury with Fall? 0 0 0  Risk for fall due to : - History of fall(s) -  Follow up - Falls prevention discussed -   PHQ 2/9 Scores 04/08/2021 03/19/2021 03/04/2021 09/04/2020 04/03/2020 03/06/2020 01/24/2020  PHQ - 2 Score 0 0 0 0 0 0 0  PHQ- 9 Score - - - - - - -      Goals Addressed             This Visit's Progress    COMPLETED: [facility] - Make and Keep All Appointments       Timeframe:  Long-Range Goal Priority:  High Start Date:            3/24  Expected End Date:   9/24                Barriers: Transportation    - ask family or friend for a ride - call to cancel if needed    Why is this important?   Part of staying healthy is seeing the doctor for follow-up care.  If you forget your appointments, there are some things you can do to stay on track.    Notes:   7/7 - Was seen by PCP a couple weeks ago, denies any concern, will follow up in September.    10/3 - Report office visit with PCP completed on 8/16 and with vascular on 9/20.  Will follow up with nephrology on 10/12.  Concerned about transportation as she spent $50 last month for rides because she was told she had to re-certify for [employer].  Advised about [name] transportation as well as referral placed to [place] for resources.     COMPLETED: [facility] - Track and Manage My Blood  Pressure-Hypertension       Timeframe:  Long-Range Goal Priority:  Medium Start Date:       3/24                      Expected End Date:   9/24                    Barriers: Knowledge    - check blood pressure daily    Why is this important?   You won't feel high blood pressure, but it can still hurt your blood vessels.  High blood pressure can cause heart or kidney problems. It can also cause a stroke.  Making lifestyle changes like losing a little weight or eating less salt will help.  Checking your blood pressure at home and at different times of the day can help to control blood pressure.  If the doctor prescribes medicine remember to take it the way the doctor ordered.  Call the office if you cannot afford the medicine or if there are questions about it.     Notes:   7/7 - Report blood pressure has been good, denies any headache or other symptoms of HTN.  Also report blood sugars are doing well, A1C decreased to 7.5  10/3 - State BP has remained "good."  A1C has also continued to decrease to 7.1.  Was told by nephrologist that she will need to have Retacrit injections for chronic anemia, approved by insurance, first one scheduled for 10/10        Plan:  Follow-up: Patient agrees to Care Plan and Follow-up. Follow-up in 2 week(s) after first injection.  Will plan to transition to health coach at that time.  [name], [place], MSN [place] [phone]

## 2021-10-02 ENCOUNTER — Other Ambulatory Visit (HOSPITAL_COMMUNITY): Payer: Self-pay | Admitting: *Deleted

## 2021-10-02 ENCOUNTER — Encounter: Payer: Self-pay | Admitting: Internal Medicine

## 2021-10-02 DIAGNOSIS — D631 Anemia in chronic kidney disease: Secondary | ICD-10-CM | POA: Insufficient documentation

## 2021-10-02 DIAGNOSIS — N185 Chronic kidney disease, stage 5: Secondary | ICD-10-CM | POA: Insufficient documentation

## 2021-10-05 ENCOUNTER — Ambulatory Visit (HOSPITAL_COMMUNITY)
Admission: RE | Admit: 2021-10-05 | Discharge: 2021-10-05 | Disposition: A | Discharge status: Home / Self Care | Payer: Medicare Other | Source: Ambulatory Visit | Attending: Internal Medicine | Admitting: Internal Medicine

## 2021-10-05 ENCOUNTER — Other Ambulatory Visit: Payer: Self-pay

## 2021-10-05 DIAGNOSIS — N185 Chronic kidney disease, stage 5: Secondary | ICD-10-CM | POA: Diagnosis not present

## 2021-10-05 DIAGNOSIS — D631 Anemia in chronic kidney disease: Secondary | ICD-10-CM | POA: Diagnosis not present

## 2021-10-05 MED ORDER — EPOETIN ALFA-EPBX 10000 UNIT/ML IJ SOLN
20000.0000 [IU] | Freq: Once | INTRAMUSCULAR | Status: AC
  Administered 2021-10-05: 20000 [IU] via SUBCUTANEOUS

## 2021-10-05 MED ORDER — EPOETIN ALFA-EPBX 10000 UNIT/ML IJ SOLN
INTRAMUSCULAR | Status: AC
  Filled 2021-10-05: qty 2

## 2021-10-06 LAB — POCT HEMOGLOBIN-HEMACUE: Hemoglobin: 7.8 g/dL — ABNORMAL LOW (ref 12.0–15.0)

## 2021-10-07 DIAGNOSIS — N185 Chronic kidney disease, stage 5: Secondary | ICD-10-CM | POA: Diagnosis not present

## 2021-10-07 DIAGNOSIS — E1122 Type 2 diabetes mellitus with diabetic chronic kidney disease: Secondary | ICD-10-CM | POA: Diagnosis not present

## 2021-10-07 DIAGNOSIS — I120 Hypertensive chronic kidney disease with stage 5 chronic kidney disease or end stage renal disease: Secondary | ICD-10-CM | POA: Diagnosis not present

## 2021-10-07 DIAGNOSIS — D649 Anemia, unspecified: Secondary | ICD-10-CM | POA: Diagnosis not present

## 2021-10-07 DIAGNOSIS — N2581 Secondary hyperparathyroidism of renal origin: Secondary | ICD-10-CM | POA: Diagnosis not present

## 2021-10-07 DIAGNOSIS — R809 Proteinuria, unspecified: Secondary | ICD-10-CM | POA: Diagnosis not present

## 2021-10-07 DIAGNOSIS — Z23 Encounter for immunization: Secondary | ICD-10-CM | POA: Diagnosis not present

## 2021-10-12 ENCOUNTER — Other Ambulatory Visit: Payer: Self-pay | Admitting: *Deleted

## 2021-10-12 NOTE — Patient Outreach (Signed)
[place] [facility]) Care Management  10/12/2021  AVREE SZCZYGIEL October 14, 1935 [id number]   Call placed to member, no answer, unable to leave voice message.  Will follow up within the next 3-4 business days.  [name], [place], MSN [place] [phone]

## 2021-10-15 ENCOUNTER — Other Ambulatory Visit: Payer: Self-pay | Admitting: *Deleted

## 2021-10-15 NOTE — Patient Outreach (Signed)
[place] [facility]) Care Management  10/15/2021  Monique Cabrera [birth year] [id number]   Outgoing attempt #2, successful.  State first Retacrit injection went well, no complications. Denies any urgent concerns, encouraged to contact this care manager with questions.  Agrees to ongoing quarterly follow up for chronic disease management.  Will place referral to health coach.  [name], [place], MSN [place] [phone]

## 2021-10-16 ENCOUNTER — Telehealth: Payer: Self-pay | Admitting: *Deleted

## 2021-10-16 ENCOUNTER — Other Ambulatory Visit: Payer: Self-pay | Admitting: *Deleted

## 2021-10-16 NOTE — Telephone Encounter (Signed)
   Telephone encounter was:  Successful.  10/16/2021 Name: Monique Cabrera MRN: [id number] DOB: 1935-03-24  Monique Cabrera is a 86 y.o. year old female who is a primary care patient of Wardell Honour, MD . The community resource team was consulted for assistance with Transportation Needs Patient has resolved the issue and has no other community resource needs   Care guide performed the following interventions: Patient provided with information about care guide support team and interviewed to confirm resource needs.  Follow Up Plan:  No further follow up planned at this time. The patient has been provided with needed resources.  [place], Care Management  [phone] [address] , [place] [address] Email : [email or web address]

## 2021-10-18 ENCOUNTER — Other Ambulatory Visit: Payer: Self-pay | Admitting: Family Medicine

## 2021-10-19 ENCOUNTER — Other Ambulatory Visit: Payer: Self-pay | Admitting: *Deleted

## 2021-10-19 MED ORDER — AMLODIPINE BESYLATE 10 MG PO TABS: 10.0000 mg | ORAL_TABLET | Freq: Every day | ORAL | 2 refills | Status: DC

## 2021-10-19 NOTE — Telephone Encounter (Signed)
Received refill request from [facility] [address]

## 2021-11-02 ENCOUNTER — Other Ambulatory Visit: Payer: Self-pay

## 2021-11-02 ENCOUNTER — Ambulatory Visit (HOSPITAL_COMMUNITY)
Admission: RE | Admit: 2021-11-02 | Discharge: 2021-11-02 | Disposition: A | Discharge status: Home / Self Care | Payer: Medicare Other | Source: Ambulatory Visit | Attending: Internal Medicine | Admitting: Internal Medicine

## 2021-11-02 VITALS — BP 170/75 | HR 72 | Resp 20

## 2021-11-02 DIAGNOSIS — N185 Chronic kidney disease, stage 5: Secondary | ICD-10-CM | POA: Insufficient documentation

## 2021-11-02 DIAGNOSIS — D631 Anemia in chronic kidney disease: Secondary | ICD-10-CM | POA: Diagnosis not present

## 2021-11-02 LAB — IRON AND TIBC
Iron: 68 ug/dL (ref 28–170)
Saturation Ratios: 30 % (ref 10.4–31.8)
TIBC: 224 ug/dL — ABNORMAL LOW (ref 250–450)
UIBC: 156 ug/dL

## 2021-11-02 LAB — FERRITIN: Ferritin: 740 ng/mL — ABNORMAL HIGH (ref 11–307)

## 2021-11-02 MED ORDER — EPOETIN ALFA-EPBX 10000 UNIT/ML IJ SOLN
INTRAMUSCULAR | Status: AC
  Administered 2021-11-02: 20000 [IU] via SUBCUTANEOUS
  Filled 2021-11-02: qty 2

## 2021-11-02 MED ORDER — EPOETIN ALFA-EPBX 10000 UNIT/ML IJ SOLN: 20000.0000 [IU] | INTRAMUSCULAR | Status: DC

## 2021-11-04 DIAGNOSIS — D631 Anemia in chronic kidney disease: Secondary | ICD-10-CM | POA: Diagnosis not present

## 2021-11-04 DIAGNOSIS — E119 Type 2 diabetes mellitus without complications: Secondary | ICD-10-CM | POA: Diagnosis not present

## 2021-11-04 DIAGNOSIS — I10 Essential (primary) hypertension: Secondary | ICD-10-CM | POA: Diagnosis not present

## 2021-11-04 DIAGNOSIS — E1151 Type 2 diabetes mellitus with diabetic peripheral angiopathy without gangrene: Secondary | ICD-10-CM | POA: Diagnosis not present

## 2021-11-04 DIAGNOSIS — Z0000 Encounter for general adult medical examination without abnormal findings: Secondary | ICD-10-CM | POA: Diagnosis not present

## 2021-11-04 DIAGNOSIS — N185 Chronic kidney disease, stage 5: Secondary | ICD-10-CM | POA: Diagnosis not present

## 2021-11-04 DIAGNOSIS — K5900 Constipation, unspecified: Secondary | ICD-10-CM | POA: Diagnosis not present

## 2021-11-04 DIAGNOSIS — S88119A Complete traumatic amputation at level between knee and ankle, unspecified lower leg, initial encounter: Secondary | ICD-10-CM | POA: Diagnosis not present

## 2021-11-04 DIAGNOSIS — I739 Peripheral vascular disease, unspecified: Secondary | ICD-10-CM | POA: Diagnosis not present

## 2021-11-04 LAB — POCT HEMOGLOBIN-HEMACUE: Hemoglobin: 9.3 g/dL — ABNORMAL LOW (ref 12.0–15.0)

## 2021-11-09 ENCOUNTER — Other Ambulatory Visit: Payer: Self-pay | Admitting: *Deleted

## 2021-11-10 ENCOUNTER — Encounter: Payer: Self-pay | Admitting: *Deleted

## 2021-11-10 NOTE — Patient Instructions (Signed)
Visit Information  PATIENT GOALS/PLAN OF CARE:  Care Plan : RN Care Manager Plan of Care  Updates made by Michiel Cowboy, RN since 11/10/2021 12:00 AM     Problem: Knowledge Deficit Related to Hypertension and CKD   Priority: High     Long-Range Goal: Development of Plan of Care for Management of Hypertension and CKD   Start Date: 11/09/2021  Expected End Date: 11/25/2022  Priority: High  Note:   Current Barriers:  Knowledge Deficits related to plan of care for management of HTN and CKD Stage 5   RNCM Clinical Goal(s):  Patient will verbalize understanding of plan for management of HTN and CKD Stage 5 as evidenced by Patient taking all prescribed medications as written, patient starting to monitor B/P daily and record values, patient's verbalization of understanding high B/P parameters, symptoms of CKD, and knowledge of when to contact her providers take all medications exactly as prescribed and will call provider for medication related questions as evidenced by Patient's verbalization that she is taking all of her prescribed medications as written    attend all scheduled medical appointments: all providers as evidenced by patient's verbalization that she is attending all provider visits and no cancellation provider visits in Epic without rescheduling        continue to work with Consulting civil engineer and/or Social Worker to address care management and care coordination needs related to HTN and CKD Stage 5 as evidenced by adherence to CM Team Scheduled appointments     Patient will increase knowledge of hypertension management and chronic kidney disease management  through collaboration with RN Care manager, provider, and care team. Referral to pharmacist for education of prescribed CKD medications  Interventions: Inter-disciplinary care team collaboration (see longitudinal plan of care) Evaluation of current treatment plan related to  self management and patient's adherence to plan as  established by provider   Hypertension Interventions:  New Goal Last practice recorded BP readings:  BP Readings from Last 3 Encounters:  11/02/21 (!) 170/75  10/05/21 (!) 152/57  09/15/21 (!) 174/86  Most recent eGFR/CrCl: No results found for: EGFR  No components found for: CRCL  Evaluation of current treatment plan related to hypertension self management and patient's adherence to plan as established by provider; Reviewed medications with patient and discussed importance of compliance; Discussed plans with patient for ongoing care management follow up and provided patient with direct contact information for care management team; Advised patient, providing education and rationale, to monitor blood pressure daily and record, calling PCP for findings outside established parameters;  Advised patient to discuss blood pressure goal with provider; Provided education on prescribed diet low sodium;  Discussed complications of poorly controlled blood pressure such as heart disease, stroke, circulatory complications, vision complications, kidney impairment, sexual dysfunction;  Nurse will send A Matter of Choices Blood Pressure Control Booklet     Chronic Kidney Disease Interventions:  (Status:  New goal. Reviewed medications with patient and discussed importance of compliance    Advised patient, providing education and rationale, to monitor blood pressure daily and record, calling PCP for findings outside established parameters    Discussed complications of poorly controlled blood pressure such as heart disease, stroke, circulatory complications, vision complications, kidney impairment, sexual dysfunction    Advised patient to discuss plan of care goals, treatment, and prescribed medications with provider    Provided education on kidney disease progression    Referral to pharmacy to educate patient regarding prescribed medications to treat CKD Engage patient  in early, proactive and ongoing  discussion about goals of care and what matters most to them   Patient states that she does not want to begin dialysis to treat her chronic kidney disease Nurse will send patient education regarding chronic kidney disease Last practice recorded BP readings:  BP Readings from Last 3 Encounters:  11/02/21 (!) 170/75  10/05/21 (!) 152/57  09/15/21 (!) 174/86  Most recent eGFR/CrCl: No results found for: EGFR  No components found for: CRCL  Patient Goals/Self-Care Activities: - check blood pressure daily - write blood pressure results in a log or diary - learn about high blood pressure - take blood pressure log to all doctor appointments - keep all doctor appointments - take medications for blood pressure exactly as prescribed - report new symptoms to your doctor - limit salt intake to 232m/day -Continue to monitor blood sugar daily and keep diabetes controlled  -Discuss with Dr. KJohnney Ourationale for prescribed medications and treatment goals for chronic kidney disease        The patient verbalized understanding of instructions, educational materials, and care plan provided today and agreed to receive a mailed copy of patient instructions, educational materials, and care plan.   Telephone follow up appointment with care management team member scheduled fKNI:OCODIRY JEmelia LoronRN, [email or web address]

## 2021-11-10 NOTE — Patient Outreach (Signed)
[name]) Care Management  11/10/2021  Monique Cabrera 1935/01/16 [id number] [place] [facility]) Care Management RN Health Coach Note   11/10/2021 Name:  Monique Cabrera MRN:  [id number] DOB:  January 01, 1935  Summary: Patient states that she feels good presently. Her main goals are to control her B/P and learn more about her CKD. Patient reports that she does not monitor her B/P currently but she will begin. Patient states she would like more information regarding her medications that treat her CKD. Nurse will send a referral to pharmacy for further education. Patient explains that she lives with her daughter, her home environment is safe, and she is well supported by her family.   Recommendations/Changes made from today's visit: Begin monitoring B/P daily and record the values Follow a low sodium diet and diabetic diet Contact Dr. Johnney Ou to request further information about CKD medications and treatments  Subjective: Monique Cabrera is an 86 y.o. year old female who is a primary patient of Wardell Honour, MD. The care management team was consulted for assistance with care management and/or care coordination needs.    RN Health Coach completed Telephone Visit today.   Objective:  Medications Reviewed Today     Reviewed by Michiel Cowboy, RN (Registered Nurse) on 11/09/21 at [place] List Status: <None>   Medication Order Taking? Sig Documenting Provider Last Dose Status Informant  amLODipine (NORVASC) 10 MG tablet [id number] Yes Take 1 tablet (10 mg total) by mouth daily. Wardell Honour, MD Taking Active   ASPIRIN LOW DOSE 81 MG EC tablet [id number] Yes Take 81 mg by mouth daily. [provider] Taking Active Self  atorvastatin (LIPITOR) 40 MG tablet [id number] Yes Take 1 tablet (40 mg total) by mouth daily. Wardell Honour, MD Taking Active   calcitRIOL (ROCALTROL) 0.25 MCG capsule [id number] Yes Take 0.25 mcg by mouth every other day.  Monday, Wednesday, and Friday. [provider] Taking Active   carvedilol (COREG) 25 MG tablet [id number] Yes TAKE 1 TABLET BY MOUTH TWICE A DAY WITH MEALS Wardell Honour, MD Taking Active   clopidogrel (PLAVIX) 75 MG tablet [id number] Yes Take 1 tablet (75 mg total) by mouth daily. Wardell Honour, MD Taking Active   feeding supplement, GLUCERNA Tamala Ser [name]) LIQD [id number] Yes Take 237 mLs by mouth as needed. [provider] Taking Active   Glucose 15 GM/32ML GEL [id number] Yes Use as needed for low blood sugars Mariea Clonts, Tiffany L, DO Taking Active   glucose blood (ONETOUCH VERIO) test strip [id number] Yes USE TO TEST BLOOD SUGAR THREE TIMES DAILY. DX: E11.9 Wardell Honour, MD Taking Active   insulin lispro (HUMALOG) 100 UNIT/ML injection [id number] Yes Inject 0.03 mLs (3 Units total) into the skin 3 (three) times daily after meals. Reed, Tiffany L, DO Taking Active Self  Insulin Pen Needle (B-D ULTRAFINE III SHORT PEN) 31G X 8 MM MISC [id number] Yes Use to check blood sugar every day. Dx: 11.29; 11.65 Reed, Tiffany L, DO Taking Active   LANTUS SOLOSTAR 100 UNIT/ML Solostar Pen [id number] Yes Inject 26 Units into the skin daily. Wardell Honour, MD Taking Active   Multiple Vitamin (MULTIVITAMIN WITH MINERALS) TABS tablet [id number] Yes Take 1 tablet by mouth daily. [provider] Taking Active Self  olmesartan (BENICAR) 20 MG tablet [id number] Yes Take 1 tablet (20 mg total) by mouth daily. Reed, Tiffany L, DO Taking Active   sodium bicarbonate 650 MG tablet [id number] Yes Take  650 mg by mouth 2 (two) times daily. [provider] Taking Active   torsemide (DEMADEX) 20 MG tablet [id number] Yes Take 20 mg by mouth daily. [provider] Taking Active              SDOH:  (Social Determinants of Health) assessments and interventions performed: Yes, assessed SDOH today and documented in the Epic system.   Care Plan  Review of patient past  medical history, allergies, medications, health status, including review of consultants reports, laboratory and other test data, was performed as part of comprehensive evaluation for care management services.   Care Plan : RN Care Manager Plan of Care  Updates made by Michiel Cowboy, RN since 11/10/2021 12:00 AM     Problem: Knowledge Deficit Related to Hypertension and CKD   Priority: High     Long-Range Goal: Development of Plan of Care for Management of Hypertension and CKD   Start Date: 11/09/2021  Expected End Date: 11/25/2022  Priority: High  Note:   Current Barriers:  Knowledge Deficits related to plan of care for management of HTN and CKD Stage 5   RNCM Clinical Goal(s):  Patient will verbalize understanding of plan for management of HTN and CKD Stage 5 as evidenced by Patient taking all prescribed medications as written, patient starting to monitor B/P daily and record values, patient's verbalization of understanding high B/P parameters, symptoms of CKD, and knowledge of when to contact her providers take all medications exactly as prescribed and will call provider for medication related questions as evidenced by Patient's verbalization that she is taking all of her prescribed medications as written    attend all scheduled medical appointments: all providers as evidenced by patient's verbalization that she is attending all provider visits and no cancellation provider visits in Epic without rescheduling        continue to work with Consulting civil engineer and/or Social Worker to address care management and care coordination needs related to HTN and CKD Stage 5 as evidenced by adherence to CM Team Scheduled appointments     Patient will increase knowledge of hypertension management and chronic kidney disease management  through collaboration with RN Care manager, provider, and care team. Referral to pharmacist for education of prescribed CKD medications  Interventions: Inter-disciplinary care  team collaboration (see longitudinal plan of care) Evaluation of current treatment plan related to  self management and patient's adherence to plan as established by provider   Hypertension Interventions:  New Goal Last practice recorded BP readings:  BP Readings from Last 3 Encounters:  11/02/21 (!) 170/75  10/05/21 (!) 152/57  09/15/21 (!) 174/86  Most recent eGFR/CrCl: No results found for: EGFR  No components found for: CRCL  Evaluation of current treatment plan related to hypertension self management and patient's adherence to plan as established by provider; Reviewed medications with patient and discussed importance of compliance; Discussed plans with patient for ongoing care management follow up and provided patient with direct contact information for care management team; Advised patient, providing education and rationale, to monitor blood pressure daily and record, calling PCP for findings outside established parameters;  Advised patient to discuss blood pressure goal with provider; Provided education on prescribed diet low sodium;  Discussed complications of poorly controlled blood pressure such as heart disease, stroke, circulatory complications, vision complications, kidney impairment, sexual dysfunction;  Nurse will send A Matter of Choices Blood Pressure Control Booklet     Chronic Kidney Disease Interventions:  (Status:  New goal. Reviewed  medications with patient and discussed importance of compliance    Advised patient, providing education and rationale, to monitor blood pressure daily and record, calling PCP for findings outside established parameters    Discussed complications of poorly controlled blood pressure such as heart disease, stroke, circulatory complications, vision complications, kidney impairment, sexual dysfunction    Advised patient to discuss plan of care goals, treatment, and prescribed medications with provider    Provided education on kidney disease  progression    Referral to pharmacy to educate patient regarding prescribed medications to treat CKD Engage patient in early, proactive and ongoing discussion about goals of care and what matters most to them   Patient states that she does not want to begin dialysis to treat her chronic kidney disease Nurse will send patient education regarding chronic kidney disease Last practice recorded BP readings:  BP Readings from Last 3 Encounters:  11/02/21 (!) 170/75  10/05/21 (!) 152/57  09/15/21 (!) 174/86  Most recent eGFR/CrCl: No results found for: EGFR  No components found for: CRCL  Patient Goals/Self-Care Activities: - check blood pressure daily - write blood pressure results in a log or diary - learn about high blood pressure - take blood pressure log to all doctor appointments - keep all doctor appointments - take medications for blood pressure exactly as prescribed - report new symptoms to your doctor - limit salt intake to 2353m/day -Continue to monitor blood sugar daily and keep diabetes controlled  -Discuss with Dr. KJohnney Ourationale for prescribed medications and treatment goals for chronic kidney disease         Plan: Telephone follow up appointment with care management team member scheduled for:  January. Nurse will send PCP a Barrier Letter and today's assessment note, will send patient hypertension and CKD education, will send a referral to pharmacy.   JEmelia LoronRN, [email or web address]

## 2021-11-11 NOTE — Patient Outreach (Signed)
[facility]) Care Management  11/11/2021  Monique Cabrera [birth year] [id number]  Referral for medication assistance from Emelia Loron, RN sent to [place].  Ina Homes [facility] Management Assistant [phone]

## 2021-11-12 ENCOUNTER — Telehealth: Payer: Self-pay

## 2021-11-12 DIAGNOSIS — Z789 Other specified health status: Secondary | ICD-10-CM

## 2021-11-12 NOTE — Progress Notes (Signed)
[name])  [place] Team    11/12/2021  JANETH TERRY [birth year] [id number]  Reason for referral: Medication Review  Referral source: [facility] RN Current insurance: [facility]  PMHx includes but not limited to:  Patient has a PMH significant for diabetes, CKD-V, and hypertension.  Outreach:  Successful telephone call with patient.  HIPAA identifiers verified.   Brief Summary I called and spoke with patient regarding referral for medication review of CKD medications. Initially, patient was very confused as to why I called and was expecting to receive information or a call from [facility]. Upon multiple attempts to explain to the patient my role, affiliated organization, and the purpose of my call, she immediately became very stern about her privacy. She stated that she had always been told to talk to her doctor first about her health needs and would rather talk to her doctor. Additionally, patient stated that she was used to reading the medication pamphlet the pharmacist gave her when ever she picked up her medication.   Eventually, she stated that people should know she does not like to give out her private health information and will not talk to me about her health as she does not know who I was. She made it very clear she did want to speak with me about her medication questions and felt Emelia Loron did a "good job" explaining her medications. Patient also stated she was anticipating mailed  information pertaining to CKD medications.   Emelia Loron was notified of my encounter with Ms. Townsend and I will provide a brief summary handout of medication for Sharee Pimple to send the patient.    Thank you for allowing pharmacy to be a part of this patient's care.   Kristeen Miss, PharmD Clinical Pharmacist [place] Cell: [phone]

## 2021-11-27 ENCOUNTER — Ambulatory Visit (INDEPENDENT_AMBULATORY_CARE_PROVIDER_SITE_OTHER): Payer: Medicare Other | Admitting: Family Medicine

## 2021-11-27 ENCOUNTER — Other Ambulatory Visit: Payer: Self-pay

## 2021-11-27 ENCOUNTER — Ambulatory Visit
Admission: RE | Admit: 2021-11-27 | Discharge: 2021-11-27 | Disposition: A | Discharge status: Home / Self Care | Payer: Medicare Other | Source: Ambulatory Visit | Attending: Family Medicine | Admitting: Family Medicine

## 2021-11-27 ENCOUNTER — Encounter: Payer: Self-pay | Admitting: Family Medicine

## 2021-11-27 VITALS — BP 180/92 | HR 74 | Temp 97.3°F | Ht 65.0 in | Wt 140.4 lb

## 2021-11-27 DIAGNOSIS — N185 Chronic kidney disease, stage 5: Secondary | ICD-10-CM

## 2021-11-27 DIAGNOSIS — R0609 Other forms of dyspnea: Secondary | ICD-10-CM

## 2021-11-27 DIAGNOSIS — R079 Chest pain, unspecified: Secondary | ICD-10-CM | POA: Diagnosis not present

## 2021-11-27 DIAGNOSIS — I10 Essential (primary) hypertension: Secondary | ICD-10-CM

## 2021-11-27 DIAGNOSIS — R0600 Dyspnea, unspecified: Secondary | ICD-10-CM | POA: Diagnosis not present

## 2021-11-27 DIAGNOSIS — R059 Cough, unspecified: Secondary | ICD-10-CM | POA: Diagnosis not present

## 2021-11-27 MED ORDER — CLONIDINE HCL 0.2 MG PO TABS: 0.2000 mg | ORAL_TABLET | Freq: Two times a day (BID) | ORAL | 1 refills | Status: DC

## 2021-11-27 NOTE — Progress Notes (Signed)
Provider:  Alain Honey, MD  Careteam: Patient Care Team: Wardell Honour, MD as PCP - General (Family Medicine) Wellington Hampshire, MD as PCP - Cardiology (Cardiology) Adrian Prows, MD as Consulting Physician (Cardiology) Rutherford Guys, MD as Consulting Physician (Ophthalmology) Justin Mend, MD as Attending Physician (Internal Medicine) Justin Mend, MD as Attending Physician (Internal Medicine) Michiel Cowboy, RN as [place] Management  PLACE OF SERVICE:  [place] Directive information    Allergies  Allergen Reactions   Invokana [Canagliflozin] Itching, Swelling and Other (See Comments)    Vaginal itching, swelling and irritation   Jardiance [Empagliflozin] Itching, Swelling and Other (See Comments)    Vaginal itching and swelling   Tuberculin Ppd Other (See Comments)    Per records from [place]  Patient presents with   Medical Management of Chronic Issues    Patient presents today for a 4 month follow-up.   Quality Metric Gaps    Eye & foot exam, zoster, Flu, COVID booster   Acute Visit    Patient reports breathing heavy and SOB for about 3 weeks now. She reports barely can walk and have to sit and rest a little when walking. Also have to sit up while sleeping due to breathing and SOB with chest pain.     HPI: Patient is a 86 y.o. female for past 3 weeks patient has had some shortness of breath with exertion.  She thought this was related to back car that was started by renal doctors and she consulted them.  They do not think there is a [place] and affect here she has had some chest pain but it is intermittent.  She has a dry cough but no fever or chills.  Blood pressures have also been elevated for the past 3 weeks.  By history systolic [phone] diastolic.  She is on maximal doses of amlodipine Benicar and carvedilol.  Review of Systems:  Review of Systems  Respiratory:  Positive for cough  and shortness of breath.   Cardiovascular:  Positive for chest pain.  All other systems reviewed and are negative.  Past Medical History:  Diagnosis Date   Acute upper respiratory infections of unspecified site    Amputee 08/2019   Anemia    Anemia, unspecified    Arthritis    Atherosclerosis of native arteries of the extremities, unspecified    Chest pain, unspecified    Chronic kidney disease (CKD), stage II (mild)    [facility]   Diabetes mellitus without complication (HCC)    Type II   Diarrhea    Disorder of bone and cartilage, unspecified    Herpes zoster with other nervous system complications(053.19)    History of blood transfusion    Hypercalcemia    Hypertension    Hypertensive renal disease, benign    Nonspecific reaction to tuberculin skin test without active tuberculosis(795.51)    Other and unspecified hyperlipidemia    PAD (peripheral artery disease) ([place])    Per records from [name]   Pain in joint, lower leg    Peripheral arterial disease ([place])    Postherpetic neuralgia    Proteinuria    Stroke ([place]) 01/2017   Type II or unspecified type diabetes mellitus with renal manifestations, uncontrolled(250.42)    Unspecified disorder of kidney and ureter    Past Surgical History:  Procedure Laterality Date   ABDOMINAL AORTOGRAM W/LOWER EXTREMITY N/A 10/31/2019  Procedure: ABDOMINAL AORTOGRAM W/LOWER EXTREMITY;  Surgeon: Wellington Hampshire, MD;  Location: [place];  Service: Cardiovascular;  Laterality: N/A;   ABDOMINAL AORTOGRAM W/LOWER EXTREMITY Left 05/12/2020   Procedure: ABDOMINAL AORTOGRAM W/LOWER EXTREMITY;  Surgeon: Waynetta Sandy, MD;  Location: [place] [facility];  Service: Cardiovascular;  Laterality: Left;   AMPUTATION Right 11/02/2019   Procedure: AMPUTATION BELOW KNEE RIGHT;  Surgeon: Rosetta Posner, MD;  Location: [facility] OR;  Service: Vascular;  Laterality: Right;   COLONOSCOPY     x 2   ENDARTERECTOMY FEMORAL Left 05/21/2020    Procedure: LEFT COMMON FEMORAL ENDARTERECTOMY;  Surgeon: Rosetta Posner, MD;  Location: [facility] OR;  Service: Vascular;  Laterality: Left;   FEMORAL-POPLITEAL BYPASS GRAFT Left 05/21/2020   Procedure: LEFT FEMORAL TO BELOW KNEE POPLITEAL ARTERY BYPASS GRAFT;  Surgeon: Rosetta Posner, MD;  Location: [facility] OR;  Service: Vascular;  Laterality: Left;   hysterectomy     INCISION AND DRAINAGE Left 05/27/14   sebacous cyst, ear   PRP Left    Dr. Ricki    removal of cyst from hand     removal of tumor from foot     TONSILLECTOMY     Social History:   reports that she has quit smoking. Her smoking use included cigarettes. She has never used smokeless tobacco. She reports that she does not drink alcohol and does not use drugs.  Family History  Problem Relation Age of Onset   Diabetes Mother    Diabetes Father    Diabetes Sister    Diabetes Sister     Medications: Patient's Medications  New Prescriptions   CLONIDINE (CATAPRES) 0.2 MG TABLET    Take 1 tablet (0.2 mg total) by mouth 2 (two) times daily.  Previous Medications   AMLODIPINE (NORVASC) 10 MG TABLET    Take 1 tablet (10 mg total) by mouth daily.   ASPIRIN LOW DOSE 81 MG EC TABLET    Take 81 mg by mouth daily.   ATORVASTATIN (LIPITOR) 40 MG TABLET    Take 1 tablet (40 mg total) by mouth daily.   CALCITRIOL (ROCALTROL) 0.25 MCG CAPSULE    Take 0.25 mcg by mouth every other day. Monday, Wednesday, and Friday.   CARVEDILOL (COREG) 25 MG TABLET    TAKE 1 TABLET BY MOUTH TWICE A DAY WITH MEALS   CLOPIDOGREL (PLAVIX) 75 MG TABLET    Take 1 tablet (75 mg total) by mouth daily.   FEEDING SUPPLEMENT, GLUCERNA SHAKE, (GLUCERNA SHAKE) LIQD    Take 237 mLs by mouth as needed.   GLUCOSE 15 GM/32ML GEL    Use as needed for low blood sugars   GLUCOSE BLOOD (ONETOUCH VERIO) TEST STRIP    USE TO TEST BLOOD SUGAR THREE TIMES DAILY. DX: E11.9   INSULIN LISPRO (HUMALOG) 100 UNIT/ML INJECTION    Inject 0.03 mLs (3 Units total) into the skin 3 (three) times  daily after meals.   INSULIN PEN NEEDLE (B-D ULTRAFINE III SHORT PEN) 31G X 8 MM MISC    Use to check blood sugar every day. Dx: 11.29; 11.65   LANTUS SOLOSTAR 100 UNIT/ML SOLOSTAR PEN    Inject 26 Units into the skin daily.   MULTIPLE VITAMIN (MULTIVITAMIN WITH MINERALS) TABS TABLET    Take 1 tablet by mouth daily.   OLMESARTAN (BENICAR) 20 MG TABLET    Take 1 tablet (20 mg total) by mouth daily.   SODIUM BICARBONATE 650 MG TABLET    Take  650 mg by mouth 2 (two) times daily.   TORSEMIDE (DEMADEX) 20 MG TABLET    Take 20 mg by mouth daily.  Modified Medications   No medications on file  Discontinued Medications   No medications on file    Physical Exam:  Vitals:   11/27/21 0853  BP: (!) 180/92  Pulse: 74  Temp: (!) 97.3 F (36.3 C)  SpO2: 98%  Weight: 140 lb 6.4 oz (63.7 kg)  Height: 5\' 5"  (1.651 m)   Body mass index is 23.36 kg/m. Wt Readings from Last 3 Encounters:  11/27/21 140 lb 6.4 oz (63.7 kg)  09/15/21 142 lb 9.6 oz (64.7 kg)  08/12/21 143 lb (64.9 kg)    Physical Exam Vitals and nursing note reviewed.  Constitutional:      Appearance: Normal appearance.  HENT:     Head: Normocephalic.  Cardiovascular:     Rate and Rhythm: Normal rate and regular rhythm.  Pulmonary:     Effort: Pulmonary effort is normal.     Breath sounds: Normal breath sounds.  Neurological:     General: No focal deficit present.     Mental Status: She is alert and oriented to person, place, and time.    Labs reviewed: Basic Metabolic Panel: Recent Labs    01/05/21 1405 04/06/21 1216  NA 138 140  K 3.9 4.1  CL 108 109  CO2 24 21  GLUCOSE 184* 150*  BUN 36* 47*  CREATININE 3.04* 3.66*  CALCIUM 9.0 8.9   Liver Function Tests: Recent Labs    01/05/21 1405  AST 16  ALT 13  BILITOT 0.3  PROT 5.2*   No results for input(s): LIPASE, AMYLASE in the last 8760 hours. No results for input(s): AMMONIA in the last 8760 hours. CBC: Recent Labs    01/05/21 1405 04/06/21 1216  10/05/21 1422 11/02/21 1511  WBC 8.1 8.5  --   --   NEUTROABS 4,982 5,279  --   --   HGB 10.2* 9.4* 7.8* 9.3*  HCT 30.0* 28.2*  --   --   MCV 92.3 96.2  --   --   PLT 228 206  --   --    Lipid Panel: Recent Labs    01/05/21 1405 04/06/21 1216  CHOL 167 164  HDL 73 81  LDLCALC 78 68  TRIG 77 71  CHOLHDL 2.3 2.0   TSH: No results for input(s): TSH in the last 8760 hours. A1C: Lab Results  Component Value Date   HGBA1C 7.1 (H) 08/12/2021     Assessment/Plan  1. Essential hypertension, benign Will add clonidine as needed for pressure greater than 170, take 1 tablet (2/10 mg) and repeat blood pressure in 30 minutes - cloNIDine (CATAPRES) 0.2 MG tablet; Take 1 tablet (0.2 mg total) by mouth 2 (two) times daily.  Dispense: 30 tablet; Refill: 1  2. Dyspnea on exertion Unclear etiology.  Concerned about PE although pulse ox and heart rate are not consistent with this - DG Chest 2 View  3. Chronic kidney disease (CKD), stage V ([place]) Followed by nephrology and takes Calcitrol and now bicarb, and torsemide     Alain Honey, MD [place] [phone]

## 2021-11-27 NOTE — Patient Instructions (Signed)
Take clonidine for BP over 170

## 2021-11-28 ENCOUNTER — Other Ambulatory Visit: Payer: Self-pay | Admitting: Family Medicine

## 2021-11-28 DIAGNOSIS — J189 Pneumonia, unspecified organism: Secondary | ICD-10-CM

## 2021-11-28 MED ORDER — AZITHROMYCIN 250 MG PO TABS: ORAL_TABLET | ORAL | 0 refills | Status: AC

## 2021-11-30 ENCOUNTER — Ambulatory Visit (HOSPITAL_COMMUNITY)
Admission: RE | Admit: 2021-11-30 | Discharge: 2021-11-30 | Disposition: A | Discharge status: Home / Self Care | Payer: Medicare Other | Source: Ambulatory Visit | Attending: Internal Medicine | Admitting: Internal Medicine

## 2021-11-30 VITALS — BP 153/70 | HR 60 | Temp 96.7°F | Resp 20

## 2021-11-30 DIAGNOSIS — N185 Chronic kidney disease, stage 5: Secondary | ICD-10-CM | POA: Insufficient documentation

## 2021-11-30 DIAGNOSIS — D631 Anemia in chronic kidney disease: Secondary | ICD-10-CM | POA: Diagnosis not present

## 2021-11-30 LAB — RENAL FUNCTION PANEL
Albumin: 2.5 g/dL — ABNORMAL LOW (ref 3.5–5.0)
Anion gap: 8 (ref 5–15)
BUN: 62 mg/dL — ABNORMAL HIGH (ref 8–23)
CO2: 21 mmol/L — ABNORMAL LOW (ref 22–32)
Calcium: 8.6 mg/dL — ABNORMAL LOW (ref 8.9–10.3)
Chloride: 108 mmol/L (ref 98–111)
Creatinine, Ser: 8.06 mg/dL — ABNORMAL HIGH (ref 0.44–1.00)
GFR, Estimated: 5 mL/min — ABNORMAL LOW (ref >60)
Glucose, Bld: 115 mg/dL — ABNORMAL HIGH (ref 70–99)
Phosphorus: 6.6 mg/dL — ABNORMAL HIGH (ref 2.5–4.6)
Potassium: 3.6 mmol/L (ref 3.5–5.1)
Sodium: 137 mmol/L (ref 135–145)

## 2021-11-30 LAB — IRON AND TIBC
Iron: 50 ug/dL (ref 28–170)
Saturation Ratios: 22 % (ref 10.4–31.8)
TIBC: 223 ug/dL — ABNORMAL LOW (ref 250–450)
UIBC: 173 ug/dL

## 2021-11-30 LAB — FERRITIN: Ferritin: 731 ng/mL — ABNORMAL HIGH (ref 11–307)

## 2021-11-30 LAB — POCT HEMOGLOBIN-HEMACUE: Hemoglobin: 8.9 g/dL — ABNORMAL LOW (ref 12.0–15.0)

## 2021-11-30 MED ORDER — EPOETIN ALFA-EPBX 10000 UNIT/ML IJ SOLN
INTRAMUSCULAR | Status: AC
  Filled 2021-11-30: qty 2

## 2021-11-30 MED ORDER — EPOETIN ALFA-EPBX 10000 UNIT/ML IJ SOLN
20000.0000 [IU] | INTRAMUSCULAR | Status: DC
  Administered 2021-11-30: 20000 [IU] via SUBCUTANEOUS

## 2021-12-01 ENCOUNTER — Other Ambulatory Visit: Payer: Self-pay

## 2021-12-01 ENCOUNTER — Encounter: Payer: Self-pay | Admitting: Family Medicine

## 2021-12-01 ENCOUNTER — Ambulatory Visit (INDEPENDENT_AMBULATORY_CARE_PROVIDER_SITE_OTHER): Payer: Medicare Other | Admitting: Family Medicine

## 2021-12-01 VITALS — BP 134/72 | HR 65 | Temp 96.2°F | Ht 65.0 in | Wt 143.8 lb

## 2021-12-01 DIAGNOSIS — R0600 Dyspnea, unspecified: Secondary | ICD-10-CM | POA: Diagnosis not present

## 2021-12-01 DIAGNOSIS — I10 Essential (primary) hypertension: Secondary | ICD-10-CM | POA: Diagnosis not present

## 2021-12-01 LAB — PTH, INTACT AND CALCIUM
Calcium, Total (PTH): 8.7 mg/dL (ref 8.7–10.3)
PTH: 458 pg/mL — ABNORMAL HIGH (ref 15–65)

## 2021-12-01 NOTE — Progress Notes (Signed)
Provider:  Alain Honey, MD  Careteam: Patient Care Team: Wardell Honour, MD as PCP - General (Family Medicine) Wellington Hampshire, MD as PCP - Cardiology (Cardiology) Adrian Prows, MD as Consulting Physician (Cardiology) Rutherford Guys, MD as Consulting Physician (Ophthalmology) Justin Mend, MD as Attending Physician (Internal Medicine) Justin Mend, MD as Attending Physician (Internal Medicine) Michiel Cowboy, RN as [place] Management  PLACE OF SERVICE:  [place] Directive information    Allergies  Allergen Reactions   Invokana [Canagliflozin] Itching, Swelling and Other (See Comments)    Vaginal itching, swelling and irritation   Jardiance [Empagliflozin] Itching, Swelling and Other (See Comments)    Vaginal itching and swelling   Tuberculin Ppd Other (See Comments)    Per records from [place]  Patient presents with   Medical Management of Chronic Issues    Patient presents today for 1 week follow-up. Clonidine 0.2 MG was added.     HPI: Patient is a 86 y.o. female .  Seen here last week with shortness of breath and chest pain.  Sent her for chest x-ray which showed possible bibasilar pneumonia.  Symptoms not consistent with pneumonia and that she has no fever or productive cough.  Breathing has improved since last week no chest pain. Blood pressure has been elevated and I gave her clonidine to take as needed she has been using that regularly blood pressure is much better as are her symptoms and perhaps there was a connection.  Pulse ox and pulse rate are both good as they were last week.  This is not consistent with PE or infection  Review of Systems:  Review of Systems  Constitutional:  Negative for chills and fever.  Respiratory:  Positive for cough.   Cardiovascular:  Negative for chest pain.  All other systems reviewed and are negative.  Past Medical History:  Diagnosis Date   Acute upper  respiratory infections of unspecified site    Amputee 08/2019   Anemia    Anemia, unspecified    Arthritis    Atherosclerosis of native arteries of the extremities, unspecified    Chest pain, unspecified    Chronic kidney disease (CKD), stage II (mild)    [facility]   Diabetes mellitus without complication (HCC)    Type II   Diarrhea    Disorder of bone and cartilage, unspecified    Herpes zoster with other nervous system complications(053.19)    History of blood transfusion    Hypercalcemia    Hypertension    Hypertensive renal disease, benign    Nonspecific reaction to tuberculin skin test without active tuberculosis(795.51)    Other and unspecified hyperlipidemia    PAD (peripheral artery disease) ([place])    Per records from [name]   Pain in joint, lower leg    Peripheral arterial disease ([place])    Postherpetic neuralgia    Proteinuria    Stroke ([place]) 01/2017   Type II or unspecified type diabetes mellitus with renal manifestations, uncontrolled(250.42)    Unspecified disorder of kidney and ureter    Past Surgical History:  Procedure Laterality Date   ABDOMINAL AORTOGRAM W/LOWER EXTREMITY N/A 10/31/2019   Procedure: ABDOMINAL AORTOGRAM W/LOWER EXTREMITY;  Surgeon: Wellington Hampshire, MD;  Location: [place] [facility];  Service: Cardiovascular;  Laterality: N/A;   ABDOMINAL AORTOGRAM W/LOWER EXTREMITY Left 05/12/2020   Procedure: ABDOMINAL AORTOGRAM W/LOWER EXTREMITY;  Surgeon: Waynetta Sandy,  MD;  Location: [place] [facility];  Service: Cardiovascular;  Laterality: Left;   AMPUTATION Right 11/02/2019   Procedure: AMPUTATION BELOW KNEE RIGHT;  Surgeon: Rosetta Posner, MD;  Location: [facility] OR;  Service: Vascular;  Laterality: Right;   COLONOSCOPY     x 2   ENDARTERECTOMY FEMORAL Left 05/21/2020   Procedure: LEFT COMMON FEMORAL ENDARTERECTOMY;  Surgeon: Rosetta Posner, MD;  Location: [facility] OR;  Service: Vascular;  Laterality: Left;   FEMORAL-POPLITEAL BYPASS GRAFT Left  05/21/2020   Procedure: LEFT FEMORAL TO BELOW KNEE POPLITEAL ARTERY BYPASS GRAFT;  Surgeon: Rosetta Posner, MD;  Location: [facility] OR;  Service: Vascular;  Laterality: Left;   hysterectomy     INCISION AND DRAINAGE Left 05/27/14   sebacous cyst, ear   PRP Left    Dr. Ricki    removal of cyst from hand     removal of tumor from foot     TONSILLECTOMY     Social History:   reports that she has quit smoking. Her smoking use included cigarettes. She has never used smokeless tobacco. She reports that she does not drink alcohol and does not use drugs.  Family History  Problem Relation Age of Onset   Diabetes Mother    Diabetes Father    Diabetes Sister    Diabetes Sister     Medications: Patient's Medications  New Prescriptions   No medications on file  Previous Medications   AMLODIPINE (NORVASC) 10 MG TABLET    Take 1 tablet (10 mg total) by mouth daily.   ASPIRIN LOW DOSE 81 MG EC TABLET    Take 81 mg by mouth daily.   ATORVASTATIN (LIPITOR) 40 MG TABLET    Take 1 tablet (40 mg total) by mouth daily.   AZITHROMYCIN (ZITHROMAX) 250 MG TABLET    Take 2 tablets on day 1, then 1 tablet daily on days 2 through 5   CALCITRIOL (ROCALTROL) 0.25 MCG CAPSULE    Take 0.25 mcg by mouth every other day. Monday, Wednesday, and Friday.   CARVEDILOL (COREG) 25 MG TABLET    TAKE 1 TABLET BY MOUTH TWICE A DAY WITH MEALS   CLONIDINE (CATAPRES) 0.2 MG TABLET    Take 1 tablet (0.2 mg total) by mouth 2 (two) times daily.   CLOPIDOGREL (PLAVIX) 75 MG TABLET    Take 1 tablet (75 mg total) by mouth daily.   FEEDING SUPPLEMENT, GLUCERNA SHAKE, (GLUCERNA SHAKE) LIQD    Take 237 mLs by mouth as needed.   GLUCOSE 15 GM/32ML GEL    Use as needed for low blood sugars   GLUCOSE BLOOD (ONETOUCH VERIO) TEST STRIP    USE TO TEST BLOOD SUGAR THREE TIMES DAILY. DX: E11.9   INSULIN LISPRO (HUMALOG) 100 UNIT/ML INJECTION    Inject 0.03 mLs (3 Units total) into the skin 3 (three) times daily after meals.   INSULIN PEN NEEDLE  (B-D ULTRAFINE III SHORT PEN) 31G X 8 MM MISC    Use to check blood sugar every day. Dx: 11.29; 11.65   LANTUS SOLOSTAR 100 UNIT/ML SOLOSTAR PEN    Inject 26 Units into the skin daily.   MULTIPLE VITAMIN (MULTIVITAMIN WITH MINERALS) TABS TABLET    Take 1 tablet by mouth daily.   OLMESARTAN (BENICAR) 20 MG TABLET    Take 1 tablet (20 mg total) by mouth daily.   SODIUM BICARBONATE 650 MG TABLET    Take 650 mg by mouth 2 (two) times daily.   TORSEMIDE (DEMADEX)  20 MG TABLET    Take 20 mg by mouth daily.  Modified Medications   No medications on file  Discontinued Medications   No medications on file    Physical Exam:  Vitals:   12/01/21 1424  BP: 134/72  Pulse: 65  Temp: (!) 96.2 F (35.7 C)  SpO2: 96%  Weight: 143 lb 12.8 oz (65.2 kg)  Height: 5\' 5"  (1.651 m)   Body mass index is 23.93 kg/m. Wt Readings from Last 3 Encounters:  12/01/21 143 lb 12.8 oz (65.2 kg)  11/27/21 140 lb 6.4 oz (63.7 kg)  09/15/21 142 lb 9.6 oz (64.7 kg)    Physical Exam Vitals and nursing note reviewed.  Constitutional:      Appearance: Normal appearance.  Cardiovascular:     Rate and Rhythm: Normal rate and regular rhythm.  Pulmonary:     Effort: Pulmonary effort is normal.     Breath sounds: Normal breath sounds.  Neurological:     General: No focal deficit present.     Mental Status: She is alert and oriented to person, place, and time.    Labs reviewed: Basic Metabolic Panel: Recent Labs    01/05/21 1405 04/06/21 1216 11/30/21 1404  NA 138 140 137  K 3.9 4.1 3.6  CL 108 109 108  CO2 24 21 21*  GLUCOSE 184* 150* 115*  BUN 36* 47* 62*  CREATININE 3.04* 3.66* 8.06*  CALCIUM 9.0 8.9 8.6*  8.7  PHOS  --   --  6.6*   Liver Function Tests: Recent Labs    01/05/21 1405 11/30/21 1404  AST 16  --   ALT 13  --   BILITOT 0.3  --   PROT 5.2*  --   ALBUMIN  --  2.5*   No results for input(s): LIPASE, AMYLASE in the last 8760 hours. No results for input(s): AMMONIA in the last  8760 hours. CBC: Recent Labs    01/05/21 1405 04/06/21 1216 10/05/21 1422 11/02/21 1511 11/30/21 1404  WBC 8.1 8.5  --   --   --   NEUTROABS 4,982 5,279  --   --   --   HGB 10.2* 9.4* 7.8* 9.3* 8.9*  HCT 30.0* 28.2*  --   --   --   MCV 92.3 96.2  --   --   --   PLT 228 206  --   --   --    Lipid Panel: Recent Labs    01/05/21 1405 04/06/21 1216  CHOL 167 164  HDL 73 81  LDLCALC 78 68  TRIG 77 71  CHOLHDL 2.3 2.0   TSH: No results for input(s): TSH in the last 8760 hours. A1C: Lab Results  Component Value Date   HGBA1C 7.1 (H) 08/12/2021     Assessment/Plan  1. Dyspnea, unspecified type Improved.  May be related to better control of blood pressure but chest x-ray did show some suggestion of pneumonia.  We will go ahead and treat with 5-day course of Zithromax  2. Primary hypertension Pressure has improved here and has been better at home but she is taking clonidine regularly.  May need to add that to her chronic regimen if she continues to require it twice a day   Alain Honey, MD [place] [phone]

## 2021-12-04 ENCOUNTER — Other Ambulatory Visit: Payer: Self-pay | Admitting: Family Medicine

## 2021-12-04 DIAGNOSIS — I10 Essential (primary) hypertension: Secondary | ICD-10-CM

## 2021-12-07 DIAGNOSIS — R0609 Other forms of dyspnea: Secondary | ICD-10-CM | POA: Diagnosis not present

## 2021-12-07 DIAGNOSIS — D649 Anemia, unspecified: Secondary | ICD-10-CM | POA: Diagnosis not present

## 2021-12-07 DIAGNOSIS — I120 Hypertensive chronic kidney disease with stage 5 chronic kidney disease or end stage renal disease: Secondary | ICD-10-CM | POA: Diagnosis not present

## 2021-12-07 DIAGNOSIS — R809 Proteinuria, unspecified: Secondary | ICD-10-CM | POA: Diagnosis not present

## 2021-12-07 DIAGNOSIS — N185 Chronic kidney disease, stage 5: Secondary | ICD-10-CM | POA: Diagnosis not present

## 2021-12-07 DIAGNOSIS — N2581 Secondary hyperparathyroidism of renal origin: Secondary | ICD-10-CM | POA: Diagnosis not present

## 2021-12-07 DIAGNOSIS — E1122 Type 2 diabetes mellitus with diabetic chronic kidney disease: Secondary | ICD-10-CM | POA: Diagnosis not present

## 2021-12-15 ENCOUNTER — Ambulatory Visit: Payer: Medicare Other | Admitting: Family Medicine

## 2021-12-25 ENCOUNTER — Ambulatory Visit (INDEPENDENT_AMBULATORY_CARE_PROVIDER_SITE_OTHER): Payer: Medicare Other | Admitting: Family

## 2021-12-25 ENCOUNTER — Encounter: Payer: Self-pay | Admitting: Family

## 2021-12-25 ENCOUNTER — Other Ambulatory Visit: Payer: Self-pay

## 2021-12-25 VITALS — BP 170/80 | HR 93 | Temp 96.4°F | Resp 16 | Ht 65.0 in | Wt 148.2 lb

## 2021-12-25 DIAGNOSIS — F419 Anxiety disorder, unspecified: Secondary | ICD-10-CM

## 2021-12-25 DIAGNOSIS — F32A Depression, unspecified: Secondary | ICD-10-CM

## 2021-12-25 DIAGNOSIS — E1151 Type 2 diabetes mellitus with diabetic peripheral angiopathy without gangrene: Secondary | ICD-10-CM

## 2021-12-25 DIAGNOSIS — R55 Syncope and collapse: Secondary | ICD-10-CM

## 2021-12-25 LAB — GLUCOSE, POCT (MANUAL RESULT ENTRY): POC Glucose: 137 mg/dl — AB (ref 70–99)

## 2021-12-25 MED ORDER — LANTUS SOLOSTAR 100 UNIT/ML ~~LOC~~ SOPN: 20.0000 [IU] | PEN_INJECTOR | Freq: Every day | SUBCUTANEOUS | 3 refills | Status: AC

## 2021-12-25 NOTE — Patient Instructions (Signed)
-   Reduce Lantus from 26 units to 20 units daily

## 2021-12-25 NOTE — Progress Notes (Signed)
Provider: Chaia Ikard FNP-C  Wardell Honour, MD  Patient Care Team: Wardell Honour, MD as PCP - General (Family Medicine) Wellington Hampshire, MD as PCP - Cardiology (Cardiology) Adrian Prows, MD as Consulting Physician (Cardiology) Rutherford Guys, MD as Consulting Physician (Ophthalmology) Justin Mend, MD as Attending Physician (Internal Medicine) Justin Mend, MD as Attending Physician (Internal Medicine) Michiel Cowboy, RN as [place] [facility]  Extended Emergency Contact Information Primary Emergency Contact: Labette of [place] Phone: [phone] Relation: Daughter Secondary Emergency Contact: Stevan Born [place] Phone: [phone] Relation: Son  Code Status:  DNR Goals of care: Advanced Directive information Advanced Directives 12/25/2021  Does Patient Have a Medical Advance Directive? Yes  Type of Advance Directive Out of facility DNR (pink MOST or yellow form)  Does patient want to make changes to medical advance directive? No - Patient declined  Would patient like information on creating a medical advance directive? -  Pre-existing out of facility DNR order (yellow form or pink MOST form) -     Chief Complaint  Patient presents with   Acute Visit    Patient complains of low blood sugar, high bp, and no appetite. Patient and daughter want to know if patient needs to take all medications.     HPI:  Pt is a 86 y.o. female seen today for an acute visit for evaluation of low blood sugar.she had half of sandwich yesterday.Has had no good appetite for about 2 months.She is here with her Older daughter. CBG here  today 137  Has been feeling depressed.Nephrologist told her to go on Hospice or dialysis. She does not want to go on dialysis so she feels Kidney specialist is putting pressure on her.Has a lot of anxiety speaking to Kidney specialist.thought her Kidneys were doing better on previous lab work. Daughter  states would like some of the unnecessary medication to be discontinued.Has significant medical history of stroke currently on Atorvastatin and clopidogrel  Also on Carvedilol and Torsemide for CHF as well Blood pressure Olmesartan ,clonidine and Norvasc. Medication reviewed with patient and daughter. B/p elevated 170/80  Had recent done by Nephrologist BUN 62,CR 8.06    Past Medical History:  Diagnosis Date   Acute upper respiratory infections of unspecified site    Amputee 08/2019   Anemia    Anemia, unspecified    Arthritis    Atherosclerosis of native arteries of the extremities, unspecified    Chest pain, unspecified    Chronic kidney disease (CKD), stage II (mild)    [facility]   Diabetes mellitus without complication (HCC)    Type II   Diarrhea    Disorder of bone and cartilage, unspecified    Herpes zoster with other nervous system complications(053.19)    History of blood transfusion    Hypercalcemia    Hypertension    Hypertensive renal disease, benign    Nonspecific reaction to tuberculin skin test without active tuberculosis(795.51)    Other and unspecified hyperlipidemia    PAD (peripheral artery disease) ([place])    Per records from [facility]   Pain in joint, lower leg    Peripheral arterial disease ([place])    Postherpetic neuralgia    Proteinuria    Stroke ([place]) 01/2017   Type II or unspecified type diabetes mellitus with renal manifestations, uncontrolled(250.42)    Unspecified disorder of kidney and ureter    Past Surgical History:  Procedure Laterality Date   ABDOMINAL AORTOGRAM W/LOWER  EXTREMITY N/A 10/31/2019   Procedure: ABDOMINAL AORTOGRAM W/LOWER EXTREMITY;  Surgeon: Wellington Hampshire, MD;  Location: [place] [facility];  Service: Cardiovascular;  Laterality: N/A;   ABDOMINAL AORTOGRAM W/LOWER EXTREMITY Left 05/12/2020   Procedure: ABDOMINAL AORTOGRAM W/LOWER EXTREMITY;  Surgeon: Waynetta Sandy, MD;  Location: [place] [facility];  Service:  Cardiovascular;  Laterality: Left;   AMPUTATION Right 11/02/2019   Procedure: AMPUTATION BELOW KNEE RIGHT;  Surgeon: Rosetta Posner, MD;  Location: [facility] OR;  Service: Vascular;  Laterality: Right;   COLONOSCOPY     x 2   ENDARTERECTOMY FEMORAL Left 05/21/2020   Procedure: LEFT COMMON FEMORAL ENDARTERECTOMY;  Surgeon: Rosetta Posner, MD;  Location: [facility] OR;  Service: Vascular;  Laterality: Left;   FEMORAL-POPLITEAL BYPASS GRAFT Left 05/21/2020   Procedure: LEFT FEMORAL TO BELOW KNEE POPLITEAL ARTERY BYPASS GRAFT;  Surgeon: Rosetta Posner, MD;  Location: [facility] OR;  Service: Vascular;  Laterality: Left;   hysterectomy     INCISION AND DRAINAGE Left 05/27/14   sebacous cyst, ear   PRP Left    Dr. Ricki Miller   removal of cyst from hand     removal of tumor from foot     TONSILLECTOMY      Allergies  Allergen Reactions   Invokana [Canagliflozin] Itching, Swelling and Other (See Comments)    Vaginal itching, swelling and irritation   Jardiance [Empagliflozin] Itching, Swelling and Other (See Comments)    Vaginal itching and swelling   Tuberculin Ppd Other (See Comments)    Per [facility] from [facility] Encounter Medications as of 12/25/2021  Medication Sig   amLODipine (NORVASC) 10 MG tablet Take 1 tablet (10 mg total) by mouth daily.   ASPIRIN LOW DOSE 81 MG EC tablet Take 81 mg by mouth daily.   atorvastatin (LIPITOR) 40 MG tablet Take 1 tablet (40 mg total) by mouth daily.   calcitRIOL (ROCALTROL) 0.25 MCG capsule Take 0.25 mcg by mouth every other day. Monday, Wednesday, and Friday.   carvedilol (COREG) 25 MG tablet TAKE 1 TABLET BY MOUTH TWICE A DAY WITH MEALS   cloNIDine (CATAPRES) 0.2 MG tablet TAKE 1 TABLET BY MOUTH 2 TIMES DAILY.   clopidogrel (PLAVIX) 75 MG tablet Take 1 tablet (75 mg total) by mouth daily.   feeding supplement, GLUCERNA SHAKE, (GLUCERNA SHAKE) LIQD Take 237 mLs by mouth as needed.   Glucose 15 GM/32ML GEL Use as needed for low blood sugars   glucose blood  (ONETOUCH VERIO) test strip USE TO TEST BLOOD SUGAR THREE TIMES DAILY. DX: E11.9   insulin lispro (HUMALOG) 100 UNIT/ML injection Inject 0.03 mLs (3 Units total) into the skin 3 (three) times daily after meals.   Insulin Pen Needle (B-D ULTRAFINE III SHORT PEN) 31G X 8 MM MISC Use to check blood sugar every day. Dx: 11.29; 11.65   LANTUS SOLOSTAR 100 UNIT/ML Solostar Pen Inject 26 Units into the skin daily.   Multiple Vitamin (MULTIVITAMIN WITH MINERALS) TABS tablet Take 1 tablet by mouth daily.   olmesartan (BENICAR) 20 MG tablet Take 1 tablet (20 mg total) by mouth daily.   sodium bicarbonate 650 MG tablet Take 650 mg by mouth 2 (two) times daily.   torsemide (DEMADEX) 20 MG tablet Take 20 mg by mouth daily.   No facility-administered encounter medications on file as of 12/25/2021.    Review of Systems  Constitutional:  Negative for appetite change, chills, fatigue, fever and unexpected weight change.  HENT:  Negative for congestion, dental problem, ear discharge, ear pain, facial swelling, hearing loss, nosebleeds, postnasal drip, rhinorrhea, sinus pressure, sinus pain, sneezing and sore throat.   Eyes:  Negative for pain, discharge, redness, itching and visual disturbance.  Respiratory:  Negative for cough, chest tightness, shortness of breath and wheezing.   Cardiovascular:  Negative for chest pain, palpitations and leg swelling.  Gastrointestinal:  Negative for abdominal distention, abdominal pain, blood in stool, constipation, diarrhea, nausea and vomiting.  Genitourinary:        Follows up with Nephrologist declines dialysis  Hospice was recommended by Nephrologist but states declined   Musculoskeletal:  Positive for gait problem. Negative for arthralgias, back pain, joint swelling, myalgias, neck pain and neck stiffness.       [place]athom pain   Skin:  Negative for color change, pallor and rash.  Neurological:  Negative for dizziness, speech difficulty, weakness, light-headedness and  headaches.  Psychiatric/Behavioral:  Negative for agitation, behavioral problems, confusion, hallucinations, self-injury, sleep disturbance and suicidal ideas. The patient is nervous/anxious.    Immunization History  Administered Date(s) Administered   Fluad Quad(high Dose 65+) 08/23/2019, 09/04/2020   Influenza, High Dose Seasonal PF 09/30/2018   Influenza,inj,Quad PF,6+ Mos 09/24/2013, 09/19/2014, 09/13/2016   Influenza-Unspecified 11/22/2009, 10/01/2011, 09/10/2015, 09/26/2017   PFIZER(Purple Top)SARS-COV-2 Vaccination 03/08/2020, 03/31/2020, 11/27/2020, 04/20/2021   PPD Test 01/12/2010   Pneumococcal Conjugate-13 11/24/2015   Pneumococcal Polysaccharide-23 12/27/2005   Pertinent  Health Maintenance Due  Topic Date Due   OPHTHALMOLOGY EXAM  07/25/2020   INFLUENZA VACCINE  07/27/2021   FOOT EXAM  11/11/2021   HEMOGLOBIN A1C  02/12/2022   DEXA SCAN  Completed   Fall Risk 04/08/2021 11/10/2021 11/27/2021 12/01/2021 12/25/2021  Falls in the past year? 1 0 0 0 0  Number of falls in past year - - - - -  Was there an injury with Fall? 0 0 0 0 0  Fall Risk Category Calculator 1 0 0 0 0  Fall Risk Category Low Low Low Low Low  Patient Fall Risk Level Low fall risk Moderate fall risk Low fall risk Low fall risk Low fall risk  Patient at Risk for Falls Due to - Impaired mobility;Impaired vision History of fall(s) History of fall(s) No Fall Risks  Fall risk Follow up - Falls prevention discussed;Education provided;Falls evaluation completed Falls evaluation completed;Education provided;Falls prevention discussed Falls evaluation completed;Education provided;Falls prevention discussed Falls evaluation completed   Functional Status Survey:    Vitals:   12/25/21 1532  BP: (!) 170/80  Pulse: 93  Resp: 16  Temp: (!) 96.4 F (35.8 C)  SpO2: 94%  Weight: 148 lb 3.2 oz (67.2 kg)  Height: 5\' 5"  (1.651 m)   Body mass index is 24.66 kg/m. Physical Exam Vitals reviewed.  Constitutional:       General: She is not in acute distress.    Appearance: Normal appearance. She is normal weight. She is not ill-appearing or diaphoretic.  HENT:     Head: Normocephalic.     Mouth/Throat:     Mouth: Mucous membranes are moist.     Pharynx: Oropharynx is clear. No oropharyngeal exudate or posterior oropharyngeal erythema.  Eyes:     General: No scleral icterus.       Right eye: No discharge.        Left eye: No discharge.     Extraocular Movements: Extraocular movements intact.     Conjunctiva/sclera: Conjunctivae normal.     Pupils: Pupils are equal, round, and reactive to light.  Neck:     Vascular: No carotid bruit.  Cardiovascular:     Rate and Rhythm: Normal rate and regular rhythm.     Heart sounds: Normal heart sounds. No murmur heard.   No friction rub. No gallop.     Comments: Right BKA  Pulmonary:     Effort: Pulmonary effort is normal. No respiratory distress.     Breath sounds: Normal breath sounds. No wheezing, rhonchi or rales.  Chest:     Chest wall: No tenderness.  Abdominal:     General: Bowel sounds are normal. There is no distension.     Palpations: Abdomen is soft. There is no mass.     Tenderness: There is no abdominal tenderness. There is no right CVA tenderness, left CVA tenderness, guarding or rebound.  Musculoskeletal:        General: No swelling or tenderness. Normal range of motion.     Cervical back: Normal range of motion. No rigidity or tenderness.     Right lower leg: No edema.     Left lower leg: No edema.     Right Lower Extremity: Right leg is amputated below knee.  Lymphadenopathy:     Cervical: No cervical adenopathy.  Skin:    General: Skin is warm and dry.     Coloration: Skin is not pale.     Findings: No bruising, erythema, lesion or rash.  Neurological:     Mental Status: She is alert and oriented to person, place, and time.     Cranial Nerves: No cranial nerve deficit.     Sensory: No sensory deficit.     Motor: No weakness.      Coordination: Coordination normal.     Gait: Gait normal.  Psychiatric:        Mood and Affect: Mood is anxious.        Speech: Speech normal.        Behavior: Behavior normal.        Thought Content: Thought content normal.        Judgment: Judgment normal.    Labs reviewed: Recent Labs    01/05/21 1405 04/06/21 1216 11/30/21 1404  NA 138 140 137  K 3.9 4.1 3.6  CL 108 109 108  CO2 24 21 21*  GLUCOSE 184* 150* 115*  BUN 36* 47* 62*  CREATININE 3.04* 3.66* 8.06*  CALCIUM 9.0 8.9 8.6*   8.7  PHOS  --   --  6.6*   Recent Labs    01/05/21 1405 11/30/21 1404  AST 16  --   ALT 13  --   BILITOT 0.3  --   PROT 5.2*  --   ALBUMIN  --  2.5*   Recent Labs    01/05/21 1405 04/06/21 1216 10/05/21 1422 11/02/21 1511 11/30/21 1404  WBC 8.1 8.5  --   --   --   NEUTROABS 4,982 5,279  --   --   --   HGB 10.2* 9.4* 7.8* 9.3* 8.9*  HCT 30.0* 28.2*  --   --   --   MCV 92.3 96.2  --   --   --   PLT 228 206  --   --   --    Lab Results  Component Value Date   TSH 0.989 10/29/2019   Lab Results  Component Value Date   HGBA1C 7.1 (H) 08/12/2021   Lab Results  Component Value Date   CHOL 164 04/06/2021   HDL 81 04/06/2021  LDLCALC 68 04/06/2021   TRIG 71 04/06/2021   CHOLHDL 2.0 04/06/2021    Significant Diagnostic Results in last 30 days:  DG Chest 2 View  Result Date: 11/27/2021 CLINICAL DATA:  Dyspnea on exertion, chest pain, nonproductive cough for 1 month EXAM: CHEST - 2 VIEW COMPARISON:  02/08/2020 chest radiograph. FINDINGS: Stable cardiomediastinal silhouette with normal heart size. No pneumothorax. Small bilateral pleural effusions. Patchy bibasilar lung consolidation, new. No overt pulmonary edema. IMPRESSION: Patchy bibasilar lung consolidation, new, suspicious for multilobar aspiration or pneumonia, with small bilateral pleural effusions. Follow-up chest radiographs to resolution recommended. Electronically Signed   By: Ilona Sorrel M.D.   On: 11/27/2021  10:31    Assessment/Plan 1. Type II diabetes mellitus with peripheral circulatory disorder [facility]) Lab Results  Component Value Date   HGBA1C 7.1 (H) 08/12/2021  CBG today 137  Recently passed out daughter states CBG wa in the 70's was evaluated by EMS. Advised to reduce lantus from 26 units to 20 units daily. - continue on Humalog 3 units three times daily per SSI will D/C if still running low CBG Advised to avoid skipping meals and using insulin. - Advised to notify provider if CBG > 200 or < 75  - POC Glucose (CBG) - LANTUS SOLOSTAR 100 UNIT/ML Solostar Pen; Inject 20 Units into the skin daily.  Dispense: 15 mL; Refill: 3 - Hemoglobin A1c  2. Near syncope Passed out due to skipping meals yet administered insulin  Evaluated by EMS  - CBC with Differential/Platelet  3. Anxiety and depression Anxious due to CKD stage 5 states nephrologist recommend dialysis but declined so Hospice was recommend but declined thought CR was getting better.States ready to go when it's her time to go.  Latest CR 8.06  Antianxiety/antidepressant offered but declined  - continue to avoid Nephrotoxin   Family/ staff Communication: Reviewed plan of care with patient  Labs/tests ordered:  - CBC with Differential/Platelet - Hgb A 1 C - POC Glucose   Next Appointment: As needed if symptoms worsen or fail to improve    Sandrea Hughs, NP

## 2021-12-26 LAB — CBC WITH DIFFERENTIAL/PLATELET
Absolute Monocytes: 712 cells/uL (ref 200–950)
Basophils Absolute: 45 cells/uL (ref 0–200)
Basophils Relative: 0.5 %
Eosinophils Absolute: 80 cells/uL (ref 15–500)
Eosinophils Relative: 0.9 %
HCT: 32.6 % — ABNORMAL LOW (ref 35.0–45.0)
Hemoglobin: 10.8 g/dL — ABNORMAL LOW (ref 11.7–15.5)
Lymphs Abs: 1157 cells/uL (ref 850–3900)
MCH: 31.3 pg (ref 27.0–33.0)
MCHC: 33.1 g/dL (ref 32.0–36.0)
MCV: 94.5 fL (ref 80.0–100.0)
MPV: 11 fL (ref 7.5–12.5)
Monocytes Relative: 8 %
Neutro Abs: 6906 cells/uL (ref 1500–7800)
Neutrophils Relative %: 77.6 %
Platelets: 275 10*3/uL (ref 140–400)
RBC: 3.45 10*6/uL — ABNORMAL LOW (ref 3.80–5.10)
RDW: 13.1 % (ref 11.0–15.0)
Total Lymphocyte: 13 %
WBC: 8.9 10*3/uL (ref 3.8–10.8)

## 2021-12-26 LAB — HEMOGLOBIN A1C
Hgb A1c MFr Bld: 5.9 % of total Hgb — ABNORMAL HIGH (ref <5.7)
Mean Plasma Glucose: 123 mg/dL
eAG (mmol/L): 6.8 mmol/L

## 2021-12-29 ENCOUNTER — Other Ambulatory Visit (HOSPITAL_COMMUNITY): Payer: Self-pay

## 2021-12-29 ENCOUNTER — Other Ambulatory Visit: Payer: Self-pay

## 2021-12-29 ENCOUNTER — Ambulatory Visit (HOSPITAL_COMMUNITY)
Admission: RE | Admit: 2021-12-29 | Discharge: 2021-12-29 | Disposition: A | Discharge status: Home / Self Care | Payer: Medicare Other | Source: Ambulatory Visit | Attending: Internal Medicine | Admitting: Internal Medicine

## 2021-12-29 ENCOUNTER — Encounter (HOSPITAL_COMMUNITY): Payer: Self-pay

## 2021-12-29 VITALS — BP 163/85 | HR 77

## 2021-12-29 DIAGNOSIS — N185 Chronic kidney disease, stage 5: Secondary | ICD-10-CM | POA: Insufficient documentation

## 2021-12-29 DIAGNOSIS — D631 Anemia in chronic kidney disease: Secondary | ICD-10-CM | POA: Insufficient documentation

## 2021-12-29 LAB — IRON AND TIBC
Iron: 67 ug/dL (ref 28–170)
Saturation Ratios: 27 % (ref 10.4–31.8)
TIBC: 249 ug/dL — ABNORMAL LOW (ref 250–450)
UIBC: 182 ug/dL

## 2021-12-29 LAB — FERRITIN: Ferritin: 733 ng/mL — ABNORMAL HIGH (ref 11–307)

## 2021-12-29 LAB — POCT HEMOGLOBIN-HEMACUE: Hemoglobin: 9.1 g/dL — ABNORMAL LOW (ref 12.0–15.0)

## 2021-12-29 MED ORDER — EPOETIN ALFA-EPBX 10000 UNIT/ML IJ SOLN
20000.0000 [IU] | INTRAMUSCULAR | Status: DC
  Administered 2021-12-29: 20000 [IU] via SUBCUTANEOUS

## 2021-12-29 MED ORDER — EPOETIN ALFA-EPBX 10000 UNIT/ML IJ SOLN
INTRAMUSCULAR | Status: AC
  Filled 2021-12-29: qty 2

## 2021-12-30 ENCOUNTER — Encounter: Payer: Self-pay | Admitting: Family

## 2021-12-30 ENCOUNTER — Ambulatory Visit (INDEPENDENT_AMBULATORY_CARE_PROVIDER_SITE_OTHER): Payer: Medicaid Other | Admitting: Family

## 2021-12-30 ENCOUNTER — Other Ambulatory Visit: Payer: Self-pay

## 2021-12-30 ENCOUNTER — Ambulatory Visit
Admission: RE | Admit: 2021-12-30 | Discharge: 2021-12-30 | Disposition: A | Discharge status: Home / Self Care | Payer: Self-pay | Source: Ambulatory Visit | Attending: Family | Admitting: Family

## 2021-12-30 VITALS — BP 140/88 | HR 79 | Temp 96.9°F | Resp 16 | Ht 65.0 in | Wt 146.4 lb

## 2021-12-30 DIAGNOSIS — R0609 Other forms of dyspnea: Secondary | ICD-10-CM

## 2021-12-30 DIAGNOSIS — R0600 Dyspnea, unspecified: Secondary | ICD-10-CM | POA: Diagnosis not present

## 2021-12-30 DIAGNOSIS — R059 Cough, unspecified: Secondary | ICD-10-CM | POA: Diagnosis not present

## 2021-12-30 DIAGNOSIS — E1151 Type 2 diabetes mellitus with diabetic peripheral angiopathy without gangrene: Secondary | ICD-10-CM | POA: Diagnosis not present

## 2021-12-30 LAB — GLUCOSE, POCT (MANUAL RESULT ENTRY): POC Glucose: 155 mg/dl — AB (ref 70–99)

## 2021-12-30 MED ORDER — TORSEMIDE 20 MG PO TABS: 20.0000 mg | ORAL_TABLET | Freq: Every day | ORAL | 0 refills | Status: DC

## 2021-12-30 NOTE — Patient Instructions (Addendum)
-   Notify provider if CBG > 200   - continue with Lantus 20 units daily   - Continue with Humalog 3 units three times daily after meals if blood sugars are > 150   -please get chest X-ray at [place] at [name] then will call you with results.

## 2021-12-30 NOTE — Progress Notes (Signed)
Provider: Maruice Pieroni FNP-C  Wardell Honour, MD  Patient Care Team: Wardell Honour, MD as PCP - General (Family Medicine) Wellington Hampshire, MD as PCP - Cardiology (Cardiology) Adrian Prows, MD as Consulting Physician (Cardiology) Rutherford Guys, MD as Consulting Physician (Ophthalmology) Justin Mend, MD as Attending Physician (Internal Medicine) Justin Mend, MD as Attending Physician (Internal Medicine) Michiel Cowboy, RN as [facility]  Extended Emergency Contact Information Primary Emergency Contact: [place] of [place] Phone: [phone] Relation: Daughter Secondary Emergency Contact: Doyle Askew Address: [address]          [place], [place] [address] Johnnette Litter of [place] Phone: [phone] Relation: Daughter  Code Status:  DNR Goals of care: Advanced Directive information Advanced Directives 12/30/2021  Does Patient Have a Medical Advance Directive? Yes  Type of Advance Directive Out of facility DNR (pink MOST or yellow form)  Does patient want to make changes to medical advance directive? No - Patient declined  Would patient like information on creating a medical advance directive? -  Pre-existing out of facility DNR order (yellow form or pink MOST form) -     Chief Complaint  Patient presents with   Follow-up    1 week follow up on blood sugar.    HPI:  Pt is a 87 y.o. female seen today for an acute visit for evaluation of blood sugar.she was here 12/25/2021 with her daughter after an episode of passing out.she was evaluated by EMS and CBG was low.she was advised to follow up with PCP.she did not have Home CBG log.CBG during visit was 137.Lantus was reduced from 26 units to 20 units daily and to avoid skipping meals.She was advised to follow up with CBG log today.  Her CBG today is 155 and recent Hgb A 1 C 5.9 non- productive cough.Has been short of breath with exertion which has worsen.states  whenever she walks to the bathroom and back shortness of breath worsens.No worsening leg edema.Has gained 2 lbs weight    Past Medical History:  Diagnosis Date   Acute upper respiratory infections of unspecified site    Amputee 08/2019   Anemia    Anemia, unspecified    Arthritis    Atherosclerosis of native arteries of the extremities, unspecified    Chest pain, unspecified    Chronic kidney disease (CKD), stage II (mild)    [facility]   Diabetes mellitus without complication (HCC)    Type II   Diarrhea    Disorder of bone and cartilage, unspecified    Herpes zoster with other nervous system complications(053.19)    History of blood transfusion    Hypercalcemia    Hypertension    Hypertensive renal disease, benign    Nonspecific reaction to tuberculin skin test without active tuberculosis(795.51)    Other and unspecified hyperlipidemia    PAD (peripheral artery disease) ([place])    Per records from [facility]   Pain in joint, lower leg    Peripheral arterial disease ([place])    Postherpetic neuralgia    Proteinuria    Stroke ([place]) 01/2017   Type II or unspecified type diabetes mellitus with renal manifestations, uncontrolled(250.42)    Unspecified disorder of kidney and ureter    Past Surgical History:  Procedure Laterality Date   ABDOMINAL AORTOGRAM W/LOWER EXTREMITY N/A 10/31/2019   Procedure: ABDOMINAL AORTOGRAM W/LOWER EXTREMITY;  Surgeon: Wellington Hampshire, MD;  Location: [facility];  Service: Cardiovascular;  Laterality: N/A;   ABDOMINAL AORTOGRAM W/LOWER EXTREMITY Left 05/12/2020   Procedure: ABDOMINAL AORTOGRAM W/LOWER EXTREMITY;  Surgeon: Waynetta Sandy, MD;  Location: [facility];  Service: Cardiovascular;  Laterality: Left;   AMPUTATION Right 11/02/2019   Procedure: AMPUTATION BELOW KNEE RIGHT;  Surgeon: Rosetta Posner, MD;  Location: [facility];  Service: Vascular;  Laterality: Right;   COLONOSCOPY     x 2   ENDARTERECTOMY FEMORAL Left  05/21/2020   Procedure: LEFT COMMON FEMORAL ENDARTERECTOMY;  Surgeon: Rosetta Posner, MD;  Location: [facility];  Service: Vascular;  Laterality: Left;   FEMORAL-POPLITEAL BYPASS GRAFT Left 05/21/2020   Procedure: LEFT FEMORAL TO BELOW KNEE POPLITEAL ARTERY BYPASS GRAFT;  Surgeon: Rosetta Posner, MD;  Location: [facility];  Service: Vascular;  Laterality: Left;   hysterectomy     INCISION AND DRAINAGE Left 05/27/14   sebacous cyst, ear   PRP Left    Dr. Ricki Miller   removal of cyst from hand     removal of tumor from foot     TONSILLECTOMY      Allergies  Allergen Reactions   Invokana [Canagliflozin] Itching, Swelling and Other (See Comments)    Vaginal itching, swelling and irritation   Jardiance [Empagliflozin] Itching, Swelling and Other (See Comments)    Vaginal itching and swelling   Tuberculin Ppd Other (See Comments)    Per [facility] from [facility] Encounter Medications as of 12/30/2021  Medication Sig   amLODipine (NORVASC) 10 MG tablet Take 1 tablet (10 mg total) by mouth daily.   ASPIRIN LOW DOSE 81 MG EC tablet Take 81 mg by mouth daily.   atorvastatin (LIPITOR) 40 MG tablet Take 1 tablet (40 mg total) by mouth daily.   calcitRIOL (ROCALTROL) 0.25 MCG capsule Take 0.25 mcg by mouth every other day. Monday, Wednesday, and Friday.   carvedilol (COREG) 25 MG tablet TAKE 1 TABLET BY MOUTH TWICE A DAY WITH MEALS   cloNIDine (CATAPRES) 0.2 MG tablet TAKE 1 TABLET BY MOUTH 2 TIMES DAILY.   clopidogrel (PLAVIX) 75 MG tablet Take 1 tablet (75 mg total) by mouth daily.   feeding supplement, GLUCERNA SHAKE, (GLUCERNA SHAKE) LIQD Take 237 mLs by mouth as needed.   Glucose 15 GM/32ML GEL Use as needed for low blood sugars   glucose blood (ONETOUCH VERIO) test strip USE TO TEST BLOOD SUGAR THREE TIMES DAILY. DX: E11.9   insulin lispro (HUMALOG) 100 UNIT/ML injection Inject 0.03 mLs (3 Units total) into the skin 3 (three) times daily after meals.   Insulin Pen Needle (B-D ULTRAFINE III  SHORT PEN) 31G X 8 MM MISC Use to check blood sugar every day. Dx: 11.29; 11.65   LANTUS SOLOSTAR 100 UNIT/ML Solostar Pen Inject 20 Units into the skin daily.   Multiple Vitamin (MULTIVITAMIN WITH MINERALS) TABS tablet Take 1 tablet by mouth daily.   olmesartan (BENICAR) 20 MG tablet Take 1 tablet (20 mg total) by mouth daily.   sodium bicarbonate 650 MG tablet Take 650 mg by mouth 2 (two) times daily.   [DISCONTINUED] torsemide (DEMADEX) 20 MG tablet Take 20 mg by mouth daily.   torsemide (DEMADEX) 20 MG tablet Take 1 tablet (20 mg total) by mouth daily.   No facility-administered encounter medications on file as of 12/30/2021.    Review of Systems  Constitutional:  Negative for appetite change, chills, fatigue, fever and unexpected weight change.  HENT:  Negative for congestion, dental problem, ear discharge, ear  pain, facial swelling, hearing loss, nosebleeds, postnasal drip, rhinorrhea, sinus pressure, sinus pain, sneezing and sore throat.   Eyes:  Negative for pain, discharge, redness, itching and visual disturbance.  Respiratory:  Positive for cough. Negative for chest tightness and wheezing.        Shortness of breath with exertion   Cardiovascular:  Positive for leg swelling. Negative for chest pain and palpitations.  Gastrointestinal:  Negative for abdominal distention, abdominal pain, constipation, diarrhea, nausea and vomiting.  Endocrine: Negative for cold intolerance, heat intolerance, polydipsia, polyphagia and polyuria.  Musculoskeletal:  Positive for gait problem. Negative for arthralgias, back pain, joint swelling, myalgias, neck pain and neck stiffness.  Skin:  Negative for color change, pallor and rash.   Immunization History  Administered Date(s) Administered   Fluad Quad(high Dose 65+) 08/23/2019, 09/04/2020   Influenza, High Dose Seasonal PF 09/30/2018   Influenza,inj,Quad PF,6+ Mos 09/24/2013, 09/19/2014, 09/13/2016   Influenza-Unspecified 11/22/2009, 10/01/2011,  09/10/2015, 09/26/2017   PFIZER(Purple Top)SARS-COV-2 Vaccination 03/08/2020, 03/31/2020, 11/27/2020, 04/20/2021   PPD Test 01/12/2010   Pneumococcal Conjugate-13 11/24/2015   Pneumococcal Polysaccharide-23 12/27/2005   Pertinent  Health Maintenance Due  Topic Date Due   OPHTHALMOLOGY EXAM  07/25/2020   INFLUENZA VACCINE  07/27/2021   FOOT EXAM  11/11/2021   HEMOGLOBIN A1C  06/25/2022   DEXA SCAN  Completed   Fall Risk 11/27/2021 12/01/2021 12/25/2021 12/29/2021 12/30/2021  Falls in the past year? 0 0 0 - 0  Number of falls in past year - - - - -  Was there an injury with Fall? 0 0 0 - 0  Fall Risk Category Calculator 0 0 0 - 0  Fall Risk Category Low Low Low - Low  Patient Fall Risk Level Low fall risk Low fall risk Low fall risk Low fall risk Low fall risk  Patient at Risk for Falls Due to History of fall(s) History of fall(s) No Fall Risks - No Fall Risks  Fall risk Follow up Falls evaluation completed;Education provided;Falls prevention discussed Falls evaluation completed;Education provided;Falls prevention discussed Falls evaluation completed - Falls evaluation completed   Functional Status Survey:    Vitals:   12/30/21 1147  BP: 140/88  Pulse: 79  Resp: 16  Temp: (!) 96.9 F (36.1 C)  SpO2: 90%  Weight: 146 lb 6.4 oz (66.4 kg)  Height: 5\' 5"  (1.651 m)   Body mass index is 24.36 kg/m. Physical Exam Vitals reviewed.  Constitutional:      General: She is not in acute distress.    Appearance: Normal appearance. She is normal weight. She is not ill-appearing or diaphoretic.  Eyes:     General: No scleral icterus.       Right eye: No discharge.        Left eye: No discharge.     Extraocular Movements: Extraocular movements intact.     Conjunctiva/sclera: Conjunctivae normal.     Pupils: Pupils are equal, round, and reactive to light.  Cardiovascular:     Rate and Rhythm: Normal rate and regular rhythm.     Pulses: Normal pulses.     Heart sounds: Normal heart sounds.  No murmur heard.   No friction rub. No gallop.  Pulmonary:     Effort: Pulmonary effort is normal. No respiratory distress.     Breath sounds: No wheezing, rhonchi or rales.     Comments: Bilateral lungs diminished  Chest:     Chest wall: No tenderness.  Abdominal:     General: Bowel sounds are normal.  There is no distension.     Palpations: Abdomen is soft. There is no mass.     Tenderness: There is no abdominal tenderness. There is no guarding.  Musculoskeletal:        General: No swelling or tenderness. Normal range of motion.     Cervical back: Normal range of motion. No rigidity or tenderness.     Left lower leg: Edema present.     Comments: Unsteady gait  R BKA prosthetic in place   Lymphadenopathy:     Cervical: No cervical adenopathy.  Skin:    General: Skin is warm and dry.     Coloration: Skin is not pale.     Findings: No erythema or rash.  Neurological:     Mental Status: She is alert. Mental status is at baseline.     Motor: No weakness.     Gait: Gait abnormal.  Psychiatric:        Mood and Affect: Mood normal.        Speech: Speech normal.        Behavior: Behavior normal.    Labs reviewed: Recent Labs    01/05/21 1405 04/06/21 1216 11/30/21 1404  NA 138 140 137  K 3.9 4.1 3.6  CL 108 109 108  CO2 24 21 21*  GLUCOSE 184* 150* 115*  BUN 36* 47* 62*  CREATININE 3.04* 3.66* 8.06*  CALCIUM 9.0 8.9 8.6*   8.7  PHOS  --   --  6.6*   Recent Labs    01/05/21 1405 11/30/21 1404  AST 16  --   ALT 13  --   BILITOT 0.3  --   PROT 5.2*  --   ALBUMIN  --  2.5*   Recent Labs    01/05/21 1405 04/06/21 1216 10/05/21 1422 11/30/21 1404 12/25/21 1622 12/29/21 1412  WBC 8.1 8.5  --   --  8.9  --   NEUTROABS 4,982 5,279  --   --  6,906  --   HGB 10.2* 9.4*   < > 8.9* 10.8* 9.1*  HCT 30.0* 28.2*  --   --  32.6*  --   MCV 92.3 96.2  --   --  94.5  --   PLT 228 206  --   --  275  --    < > = values in this interval not displayed.   Lab Results   Component Value Date   TSH 0.989 10/29/2019   Lab Results  Component Value Date   HGBA1C 5.9 (H) 12/25/2021   Lab Results  Component Value Date   CHOL 164 04/06/2021   HDL 81 04/06/2021   LDLCALC 68 04/06/2021   TRIG 71 04/06/2021   CHOLHDL 2.0 04/06/2021    Significant Diagnostic Results in last 30 days:  DG Chest 2 View  Result Date: 12/30/2021 CLINICAL DATA:  Cough and dyspnea with exertion. EXAM: CHEST - 2 VIEW COMPARISON:  Chest x-ray 09/11/2019. FINDINGS: There are small bilateral pleural effusions. There are patchy airspace opacities in both lung bases. The heart is enlarged. There central pulmonary vascular congestion. The aorta is tortuous with atherosclerotic calcifications. No acute fractures are seen. IMPRESSION: 1. Cardiomegaly with central pulmonary vascular congestion and small effusions. 2. Bibasilar atelectasis/airspace disease. Electronically Signed   By: Ronney Asters M.D.   On: 12/30/2021 18:00    Assessment/Plan  1. Type II diabetes mellitus with peripheral circulatory disorder [name]) Lab Results  Component Value Date   HGBA1C 5.9 (H) 12/25/2021  -  continue with Lantus 20 units daily  - Continue with Humalog 3 units three times daily after meals if blood sugars are > 150  - Avoid skipping meals  - Notify provider if CBG< 75 or > 200  - POC Glucose (CBG)  2. Dyspnea on exertion Bilateral lungs diminished  2 lbs weight gain  - DG Chest 2 View; Future - torsemide (DEMADEX) 20 MG tablet; Take 1 tablet (20 mg total) by mouth daily.  Dispense: 30 tablet; Refill: 0 -please get chest X-ray at [name] then will call you with results.  Family/ staff Communication: Reviewed plan of care with patient and daughter verbalized understanding   Labs/tests ordered:  - DG Chest 2 View; Future - POC Glucose (CBG)  Next Appointment: As needed if symptoms worsen or fail to improve    Sandrea Hughs, NP

## 2021-12-31 ENCOUNTER — Ambulatory Visit: Payer: Medicare Other | Admitting: Family

## 2022-01-01 ENCOUNTER — Ambulatory Visit: Payer: Medicare Other | Admitting: Family

## 2022-01-04 ENCOUNTER — Encounter (HOSPITAL_COMMUNITY): Admission: RE | Admit: 2022-01-04 | Payer: Self-pay | Source: Ambulatory Visit

## 2022-01-05 ENCOUNTER — Other Ambulatory Visit: Payer: Self-pay | Admitting: *Deleted

## 2022-01-05 NOTE — Patient Outreach (Signed)
[facility]) Care Management  01/05/2022  Monique Cabrera 1935-07-23 [id number]  Unsuccessful outreach attempt made to patient. Patient answered the phone and stated that she would not be able to speak today. Patient did request that this nurse call back at a later date.   Plan: RN Health Coach will call patient within the month of January.  Emelia Loron RN, BSN [place] [phone] [email or web address]

## 2022-01-08 ENCOUNTER — Encounter (HOSPITAL_COMMUNITY): Payer: Self-pay

## 2022-01-13 ENCOUNTER — Other Ambulatory Visit: Payer: Self-pay

## 2022-01-13 ENCOUNTER — Ambulatory Visit (HOSPITAL_COMMUNITY)
Admission: RE | Admit: 2022-01-13 | Discharge: 2022-01-13 | Disposition: A | Discharge status: Home / Self Care | Payer: Medicare Other | Source: Ambulatory Visit

## 2022-01-13 DIAGNOSIS — I161 Hypertensive emergency: Secondary | ICD-10-CM | POA: Diagnosis not present

## 2022-01-13 DIAGNOSIS — E871 Hypo-osmolality and hyponatremia: Secondary | ICD-10-CM | POA: Diagnosis not present

## 2022-01-13 DIAGNOSIS — I132 Hypertensive heart and chronic kidney disease with heart failure and with stage 5 chronic kidney disease, or end stage renal disease: Secondary | ICD-10-CM | POA: Diagnosis not present

## 2022-01-13 DIAGNOSIS — M1990 Unspecified osteoarthritis, unspecified site: Secondary | ICD-10-CM | POA: Diagnosis not present

## 2022-01-13 DIAGNOSIS — Z89511 Acquired absence of right leg below knee: Secondary | ICD-10-CM | POA: Diagnosis not present

## 2022-01-13 DIAGNOSIS — I517 Cardiomegaly: Secondary | ICD-10-CM | POA: Diagnosis not present

## 2022-01-13 DIAGNOSIS — I214 Non-ST elevation (NSTEMI) myocardial infarction: Secondary | ICD-10-CM | POA: Diagnosis not present

## 2022-01-13 DIAGNOSIS — N185 Chronic kidney disease, stage 5: Secondary | ICD-10-CM | POA: Insufficient documentation

## 2022-01-13 DIAGNOSIS — I10 Essential (primary) hypertension: Secondary | ICD-10-CM | POA: Diagnosis not present

## 2022-01-13 DIAGNOSIS — Z7902 Long term (current) use of antithrombotics/antiplatelets: Secondary | ICD-10-CM | POA: Diagnosis not present

## 2022-01-13 DIAGNOSIS — N186 End stage renal disease: Secondary | ICD-10-CM | POA: Diagnosis not present

## 2022-01-13 DIAGNOSIS — D631 Anemia in chronic kidney disease: Secondary | ICD-10-CM | POA: Insufficient documentation

## 2022-01-13 DIAGNOSIS — E876 Hypokalemia: Secondary | ICD-10-CM | POA: Diagnosis not present

## 2022-01-13 DIAGNOSIS — E1151 Type 2 diabetes mellitus with diabetic peripheral angiopathy without gangrene: Secondary | ICD-10-CM | POA: Diagnosis not present

## 2022-01-13 DIAGNOSIS — Z8673 Personal history of transient ischemic attack (TIA), and cerebral infarction without residual deficits: Secondary | ICD-10-CM | POA: Diagnosis not present

## 2022-01-13 DIAGNOSIS — Z20822 Contact with and (suspected) exposure to covid-19: Secondary | ICD-10-CM | POA: Diagnosis not present

## 2022-01-13 DIAGNOSIS — E872 Acidosis, unspecified: Secondary | ICD-10-CM | POA: Diagnosis not present

## 2022-01-13 DIAGNOSIS — I2510 Atherosclerotic heart disease of native coronary artery without angina pectoris: Secondary | ICD-10-CM | POA: Diagnosis not present

## 2022-01-13 DIAGNOSIS — I5033 Acute on chronic diastolic (congestive) heart failure: Secondary | ICD-10-CM | POA: Diagnosis not present

## 2022-01-13 DIAGNOSIS — E1122 Type 2 diabetes mellitus with diabetic chronic kidney disease: Secondary | ICD-10-CM | POA: Diagnosis not present

## 2022-01-13 DIAGNOSIS — J90 Pleural effusion, not elsewhere classified: Secondary | ICD-10-CM | POA: Diagnosis not present

## 2022-01-13 DIAGNOSIS — E440 Moderate protein-calorie malnutrition: Secondary | ICD-10-CM | POA: Diagnosis not present

## 2022-01-13 DIAGNOSIS — Z794 Long term (current) use of insulin: Secondary | ICD-10-CM | POA: Diagnosis not present

## 2022-01-13 DIAGNOSIS — E7800 Pure hypercholesterolemia, unspecified: Secondary | ICD-10-CM | POA: Diagnosis not present

## 2022-01-13 DIAGNOSIS — Z79899 Other long term (current) drug therapy: Secondary | ICD-10-CM | POA: Diagnosis not present

## 2022-01-13 DIAGNOSIS — Z888 Allergy status to other drugs, medicaments and biological substances status: Secondary | ICD-10-CM | POA: Diagnosis not present

## 2022-01-13 DIAGNOSIS — R0789 Other chest pain: Secondary | ICD-10-CM | POA: Diagnosis not present

## 2022-01-13 DIAGNOSIS — Z7982 Long term (current) use of aspirin: Secondary | ICD-10-CM | POA: Diagnosis not present

## 2022-01-13 DIAGNOSIS — R0602 Shortness of breath: Secondary | ICD-10-CM | POA: Diagnosis not present

## 2022-01-13 MED ORDER — SODIUM CHLORIDE 0.9 % IV SOLN
510.0000 mg | INTRAVENOUS | Status: DC
  Administered 2022-01-13: 510 mg via INTRAVENOUS
  Filled 2022-01-13: qty 510

## 2022-01-15 ENCOUNTER — Other Ambulatory Visit: Payer: Self-pay

## 2022-01-15 ENCOUNTER — Encounter (HOSPITAL_COMMUNITY): Payer: Self-pay | Admitting: Emergency Medicine

## 2022-01-15 ENCOUNTER — Inpatient Hospital Stay (HOSPITAL_COMMUNITY)
Admission: EM | Admit: 2022-01-15 | Discharge: 2022-01-29 | DRG: 264 | Disposition: A | Discharge status: Home Health | Payer: Medicare Other | Attending: Internal Medicine | Admitting: Internal Medicine

## 2022-01-15 ENCOUNTER — Emergency Department (HOSPITAL_COMMUNITY): Payer: Medicare Other

## 2022-01-15 DIAGNOSIS — R112 Nausea with vomiting, unspecified: Secondary | ICD-10-CM | POA: Diagnosis not present

## 2022-01-15 DIAGNOSIS — M1990 Unspecified osteoarthritis, unspecified site: Secondary | ICD-10-CM | POA: Diagnosis present

## 2022-01-15 DIAGNOSIS — Z7902 Long term (current) use of antithrombotics/antiplatelets: Secondary | ICD-10-CM

## 2022-01-15 DIAGNOSIS — Z8673 Personal history of transient ischemic attack (TIA), and cerebral infarction without residual deficits: Secondary | ICD-10-CM | POA: Diagnosis not present

## 2022-01-15 DIAGNOSIS — Z87891 Personal history of nicotine dependence: Secondary | ICD-10-CM

## 2022-01-15 DIAGNOSIS — Z515 Encounter for palliative care: Secondary | ICD-10-CM | POA: Diagnosis not present

## 2022-01-15 DIAGNOSIS — N185 Chronic kidney disease, stage 5: Secondary | ICD-10-CM

## 2022-01-15 DIAGNOSIS — R0602 Shortness of breath: Secondary | ICD-10-CM

## 2022-01-15 DIAGNOSIS — J90 Pleural effusion, not elsewhere classified: Secondary | ICD-10-CM | POA: Diagnosis not present

## 2022-01-15 DIAGNOSIS — N186 End stage renal disease: Secondary | ICD-10-CM | POA: Diagnosis not present

## 2022-01-15 DIAGNOSIS — R079 Chest pain, unspecified: Secondary | ICD-10-CM | POA: Diagnosis not present

## 2022-01-15 DIAGNOSIS — E1129 Type 2 diabetes mellitus with other diabetic kidney complication: Secondary | ICD-10-CM | POA: Diagnosis present

## 2022-01-15 DIAGNOSIS — K8020 Calculus of gallbladder without cholecystitis without obstruction: Secondary | ICD-10-CM | POA: Diagnosis not present

## 2022-01-15 DIAGNOSIS — I2510 Atherosclerotic heart disease of native coronary artery without angina pectoris: Secondary | ICD-10-CM | POA: Diagnosis not present

## 2022-01-15 DIAGNOSIS — E872 Acidosis, unspecified: Secondary | ICD-10-CM | POA: Diagnosis present

## 2022-01-15 DIAGNOSIS — Z79899 Other long term (current) drug therapy: Secondary | ICD-10-CM | POA: Diagnosis not present

## 2022-01-15 DIAGNOSIS — E871 Hypo-osmolality and hyponatremia: Secondary | ICD-10-CM | POA: Diagnosis not present

## 2022-01-15 DIAGNOSIS — Z833 Family history of diabetes mellitus: Secondary | ICD-10-CM

## 2022-01-15 DIAGNOSIS — E1151 Type 2 diabetes mellitus with diabetic peripheral angiopathy without gangrene: Secondary | ICD-10-CM | POA: Diagnosis present

## 2022-01-15 DIAGNOSIS — I10 Essential (primary) hypertension: Secondary | ICD-10-CM | POA: Diagnosis not present

## 2022-01-15 DIAGNOSIS — R0789 Other chest pain: Secondary | ICD-10-CM | POA: Diagnosis not present

## 2022-01-15 DIAGNOSIS — I161 Hypertensive emergency: Secondary | ICD-10-CM | POA: Diagnosis not present

## 2022-01-15 DIAGNOSIS — D631 Anemia in chronic kidney disease: Secondary | ICD-10-CM | POA: Diagnosis present

## 2022-01-15 DIAGNOSIS — I509 Heart failure, unspecified: Secondary | ICD-10-CM | POA: Diagnosis not present

## 2022-01-15 DIAGNOSIS — E876 Hypokalemia: Secondary | ICD-10-CM | POA: Diagnosis not present

## 2022-01-15 DIAGNOSIS — Z9115 Patient's noncompliance with renal dialysis: Secondary | ICD-10-CM

## 2022-01-15 DIAGNOSIS — Z743 Need for continuous supervision: Secondary | ICD-10-CM | POA: Diagnosis not present

## 2022-01-15 DIAGNOSIS — I214 Non-ST elevation (NSTEMI) myocardial infarction: Secondary | ICD-10-CM | POA: Diagnosis not present

## 2022-01-15 DIAGNOSIS — E1122 Type 2 diabetes mellitus with diabetic chronic kidney disease: Secondary | ICD-10-CM | POA: Diagnosis not present

## 2022-01-15 DIAGNOSIS — Z90710 Acquired absence of both cervix and uterus: Secondary | ICD-10-CM

## 2022-01-15 DIAGNOSIS — T8249XA Other complication of vascular dialysis catheter, initial encounter: Secondary | ICD-10-CM | POA: Diagnosis not present

## 2022-01-15 DIAGNOSIS — F05 Delirium due to known physiological condition: Secondary | ICD-10-CM | POA: Diagnosis not present

## 2022-01-15 DIAGNOSIS — Z4901 Encounter for fitting and adjustment of extracorporeal dialysis catheter: Secondary | ICD-10-CM | POA: Diagnosis not present

## 2022-01-15 DIAGNOSIS — I5033 Acute on chronic diastolic (congestive) heart failure: Secondary | ICD-10-CM | POA: Diagnosis not present

## 2022-01-15 DIAGNOSIS — Z20822 Contact with and (suspected) exposure to covid-19: Secondary | ICD-10-CM | POA: Diagnosis present

## 2022-01-15 DIAGNOSIS — I5032 Chronic diastolic (congestive) heart failure: Secondary | ICD-10-CM

## 2022-01-15 DIAGNOSIS — I120 Hypertensive chronic kidney disease with stage 5 chronic kidney disease or end stage renal disease: Secondary | ICD-10-CM | POA: Diagnosis not present

## 2022-01-15 DIAGNOSIS — J810 Acute pulmonary edema: Secondary | ICD-10-CM | POA: Diagnosis not present

## 2022-01-15 DIAGNOSIS — R7989 Other specified abnormal findings of blood chemistry: Secondary | ICD-10-CM | POA: Insufficient documentation

## 2022-01-15 DIAGNOSIS — I5031 Acute diastolic (congestive) heart failure: Secondary | ICD-10-CM | POA: Diagnosis not present

## 2022-01-15 DIAGNOSIS — N184 Chronic kidney disease, stage 4 (severe): Secondary | ICD-10-CM | POA: Diagnosis not present

## 2022-01-15 DIAGNOSIS — Z89511 Acquired absence of right leg below knee: Secondary | ICD-10-CM | POA: Diagnosis not present

## 2022-01-15 DIAGNOSIS — I132 Hypertensive heart and chronic kidney disease with heart failure and with stage 5 chronic kidney disease, or end stage renal disease: Secondary | ICD-10-CM | POA: Diagnosis not present

## 2022-01-15 DIAGNOSIS — R6889 Other general symptoms and signs: Secondary | ICD-10-CM | POA: Diagnosis not present

## 2022-01-15 DIAGNOSIS — E440 Moderate protein-calorie malnutrition: Secondary | ICD-10-CM | POA: Diagnosis not present

## 2022-01-15 DIAGNOSIS — Z01810 Encounter for preprocedural cardiovascular examination: Secondary | ICD-10-CM | POA: Diagnosis not present

## 2022-01-15 DIAGNOSIS — Z992 Dependence on renal dialysis: Secondary | ICD-10-CM | POA: Diagnosis not present

## 2022-01-15 DIAGNOSIS — Z7982 Long term (current) use of aspirin: Secondary | ICD-10-CM

## 2022-01-15 DIAGNOSIS — N189 Chronic kidney disease, unspecified: Secondary | ICD-10-CM | POA: Diagnosis present

## 2022-01-15 DIAGNOSIS — Z66 Do not resuscitate: Secondary | ICD-10-CM | POA: Diagnosis not present

## 2022-01-15 DIAGNOSIS — R778 Other specified abnormalities of plasma proteins: Secondary | ICD-10-CM | POA: Insufficient documentation

## 2022-01-15 DIAGNOSIS — Z794 Long term (current) use of insulin: Secondary | ICD-10-CM

## 2022-01-15 DIAGNOSIS — Z888 Allergy status to other drugs, medicaments and biological substances status: Secondary | ICD-10-CM | POA: Diagnosis not present

## 2022-01-15 DIAGNOSIS — Z6822 Body mass index (BMI) 22.0-22.9, adult: Secondary | ICD-10-CM

## 2022-01-15 DIAGNOSIS — E7800 Pure hypercholesterolemia, unspecified: Secondary | ICD-10-CM | POA: Diagnosis present

## 2022-01-15 DIAGNOSIS — R5381 Other malaise: Secondary | ICD-10-CM | POA: Diagnosis present

## 2022-01-15 DIAGNOSIS — I499 Cardiac arrhythmia, unspecified: Secondary | ICD-10-CM | POA: Diagnosis not present

## 2022-01-15 DIAGNOSIS — I700 Atherosclerosis of aorta: Secondary | ICD-10-CM | POA: Diagnosis not present

## 2022-01-15 DIAGNOSIS — I517 Cardiomegaly: Secondary | ICD-10-CM | POA: Diagnosis not present

## 2022-01-15 DIAGNOSIS — E785 Hyperlipidemia, unspecified: Secondary | ICD-10-CM | POA: Diagnosis present

## 2022-01-15 DIAGNOSIS — N19 Unspecified kidney failure: Secondary | ICD-10-CM | POA: Diagnosis not present

## 2022-01-15 HISTORY — DX: Cerebral infarction, unspecified: I63.9

## 2022-01-15 HISTORY — DX: Gangrene, not elsewhere classified: I96

## 2022-01-15 LAB — COMPREHENSIVE METABOLIC PANEL
ALT: 17 U/L (ref 0–44)
AST: 23 U/L (ref 15–41)
Albumin: 3 g/dL — ABNORMAL LOW (ref 3.5–5.0)
Alkaline Phosphatase: 53 U/L (ref 38–126)
Anion gap: 15 (ref 5–15)
BUN: 51 mg/dL — ABNORMAL HIGH (ref 8–23)
CO2: 18 mmol/L — ABNORMAL LOW (ref 22–32)
Calcium: 9.3 mg/dL (ref 8.9–10.3)
Chloride: 104 mmol/L (ref 98–111)
Creatinine, Ser: 7.5 mg/dL — ABNORMAL HIGH (ref 0.44–1.00)
GFR, Estimated: 5 mL/min — ABNORMAL LOW (ref >60)
Glucose, Bld: 115 mg/dL — ABNORMAL HIGH (ref 70–99)
Potassium: 4.2 mmol/L (ref 3.5–5.1)
Sodium: 137 mmol/L (ref 135–145)
Total Bilirubin: 1 mg/dL (ref 0.3–1.2)
Total Protein: 6.5 g/dL (ref 6.5–8.1)

## 2022-01-15 LAB — CBC WITH DIFFERENTIAL/PLATELET
Abs Immature Granulocytes: 0.06 10*3/uL (ref 0.00–0.07)
Basophils Absolute: 0.1 10*3/uL (ref 0.0–0.1)
Basophils Relative: 1 %
Eosinophils Absolute: 0 10*3/uL (ref 0.0–0.5)
Eosinophils Relative: 0 %
HCT: 33.7 % — ABNORMAL LOW (ref 36.0–46.0)
Hemoglobin: 11 g/dL — ABNORMAL LOW (ref 12.0–15.0)
Immature Granulocytes: 1 %
Lymphocytes Relative: 17 %
Lymphs Abs: 1.5 10*3/uL (ref 0.7–4.0)
MCH: 32.1 pg (ref 26.0–34.0)
MCHC: 32.6 g/dL (ref 30.0–36.0)
MCV: 98.3 fL (ref 80.0–100.0)
Monocytes Absolute: 0.6 10*3/uL (ref 0.1–1.0)
Monocytes Relative: 7 %
Neutro Abs: 6.5 10*3/uL (ref 1.7–7.7)
Neutrophils Relative %: 74 %
Platelets: 229 10*3/uL (ref 150–400)
RBC: 3.43 MIL/uL — ABNORMAL LOW (ref 3.87–5.11)
RDW: 14.1 % (ref 11.5–15.5)
WBC: 8.7 10*3/uL (ref 4.0–10.5)
nRBC: 0 % (ref 0.0–0.2)

## 2022-01-15 LAB — RESP PANEL BY RT-PCR (FLU A&B, COVID) ARPGX2
Influenza A by PCR: NEGATIVE
Influenza B by PCR: NEGATIVE
SARS Coronavirus 2 by RT PCR: NEGATIVE

## 2022-01-15 LAB — TROPONIN I (HIGH SENSITIVITY)
Troponin I (High Sensitivity): 191 ng/L (ref <18)
Troponin I (High Sensitivity): 244 ng/L (ref <18)

## 2022-01-15 LAB — BRAIN NATRIURETIC PEPTIDE: B Natriuretic Peptide: >4500 pg/mL — ABNORMAL HIGH (ref 0.0–100.0)

## 2022-01-15 LAB — CBG MONITORING, ED: Glucose-Capillary: 115 mg/dL — ABNORMAL HIGH (ref 70–99)

## 2022-01-15 MED ORDER — ONDANSETRON HCL 4 MG/2ML IJ SOLN
4.0000 mg | Freq: Four times a day (QID) | INTRAMUSCULAR | Status: DC | PRN
  Administered 2022-01-16 – 2022-01-17 (×2): 4 mg via INTRAVENOUS
  Filled 2022-01-15 (×2): qty 2

## 2022-01-15 MED ORDER — ONDANSETRON HCL 4 MG/2ML IJ SOLN
4.0000 mg | Freq: Once | INTRAMUSCULAR | Status: AC
  Administered 2022-01-15: 4 mg via INTRAVENOUS
  Filled 2022-01-15: qty 2

## 2022-01-15 MED ORDER — INSULIN ASPART 100 UNIT/ML IJ SOLN: 0.0000 [IU] | Freq: Every day | INTRAMUSCULAR | Status: DC

## 2022-01-15 MED ORDER — NITROGLYCERIN IN D5W 200-5 MCG/ML-% IV SOLN
0.0000 ug/min | INTRAVENOUS | Status: DC
  Administered 2022-01-15: 5 ug/min via INTRAVENOUS
  Administered 2022-01-16: 85 ug/min via INTRAVENOUS
  Administered 2022-01-16: 90 ug/min via INTRAVENOUS
  Administered 2022-01-16: 100 ug/min via INTRAVENOUS
  Administered 2022-01-16: 105 ug/min via INTRAVENOUS
  Administered 2022-01-16: 85 ug/min via INTRAVENOUS
  Administered 2022-01-17: 35 ug/min via INTRAVENOUS
  Administered 2022-01-17: 100 ug/min via INTRAVENOUS
  Administered 2022-01-17: 110 ug/min via INTRAVENOUS
  Administered 2022-01-17: 100 ug/min via INTRAVENOUS
  Administered 2022-01-17: 80 ug/min via INTRAVENOUS
  Administered 2022-01-18: 35 ug/min via INTRAVENOUS
  Administered 2022-01-19: 12:00:00 50 ug/min via INTRAVENOUS
  Administered 2022-01-20: 07:00:00 40 ug/min via INTRAVENOUS
  Filled 2022-01-15 (×8): qty 250

## 2022-01-15 MED ORDER — INSULIN ASPART 100 UNIT/ML IJ SOLN: 0.0000 [IU] | Freq: Three times a day (TID) | INTRAMUSCULAR | Status: DC

## 2022-01-15 MED ORDER — ACETAMINOPHEN 650 MG RE SUPP
650.0000 mg | Freq: Four times a day (QID) | RECTAL | Status: DC | PRN
  Administered 2022-01-16: 650 mg via RECTAL
  Filled 2022-01-15: qty 1

## 2022-01-15 MED ORDER — NITROGLYCERIN 2 % TD OINT
1.0000 [in_us] | TOPICAL_OINTMENT | Freq: Once | TRANSDERMAL | Status: AC
  Administered 2022-01-15: 1 [in_us] via TOPICAL
  Filled 2022-01-15: qty 1

## 2022-01-15 MED ORDER — ASPIRIN EC 81 MG PO TBEC
81.0000 mg | DELAYED_RELEASE_TABLET | Freq: Every day | ORAL | Status: DC
  Administered 2022-01-17 – 2022-01-29 (×10): 81 mg via ORAL
  Filled 2022-01-15 (×12): qty 1

## 2022-01-15 MED ORDER — ACETAMINOPHEN 325 MG PO TABS: 650.0000 mg | ORAL_TABLET | Freq: Four times a day (QID) | ORAL | Status: DC | PRN

## 2022-01-15 MED ORDER — ONDANSETRON HCL 4 MG PO TABS: 4.0000 mg | ORAL_TABLET | Freq: Four times a day (QID) | ORAL | Status: DC | PRN

## 2022-01-15 NOTE — Assessment & Plan Note (Signed)
Chronic. 

## 2022-01-15 NOTE — ED Notes (Signed)
Admit provider is at bedside 

## 2022-01-15 NOTE — Assessment & Plan Note (Addendum)
Hemoglobin 8.4 at time of discharge, stable.

## 2022-01-15 NOTE — Assessment & Plan Note (Addendum)
Cardiology was consulted and followed due to patient's elevated troponin.  EKG without acute ST-T changes.  TTE with LVEF 40 to 45%, LV mildly decreased function, no LV regional wall motion normalities, mild asymmetric LVH, trivial MR, no aortic stenosis.  Outpatient follow-up scheduled with cardiology.

## 2022-01-15 NOTE — Subjective & Objective (Addendum)
CC: CP and SOB HPI: 87 yo AAF with hx of diastolic CHF, CKD stage 5(followed by [facility]), type 2 DM, HTN, s/p right BKA, presents to ER with CP and SOB since 0800 this AM.  Patient states that she has had shortness of breath for well over 3 months now.  It finally got so bad this morning she had to call EMS.  She denies nausea, vomiting.  She did have some chest pain this morning when she was short of breath.  She denies any lower extremity edema.  In the ER, pt noted to hypertensive with SBP >200. Pt with difficult IV access. Took several hours to establish PIV. Started on IV ntg gtts.  Labs showed troponin 191 --> 244. Cardiology consulted.  Scr 7.5 BUN 51. Bicarb 18 BNP >4500  Due to malignant HTN, elevated troponin, CXR consistent with CHF, [facility] hospitalist consulted for admission.

## 2022-01-15 NOTE — Assessment & Plan Note (Addendum)
Oxygenating well on room air.  Continue volume management with hemodialysis.

## 2022-01-15 NOTE — ED Triage Notes (Signed)
Patient BIB [facility] from home for evaluation of chest pain and shortness of breath that started at 0800 this morning.  98% on room air, received 324 mg ASA, 2x SL NTG, reports improvement in pain after NTG. Patient alert, oriented, and in no apparent distress at this time.

## 2022-01-15 NOTE — ED Provider Notes (Signed)
Care transferred from [name], PA-C at time of sign out. See their note for full assessment.   HPI: "Patient with history of hypertension, insulin-dependent diabetes, high cholesterol, peripheral arterial disease status post right-sided below the knee amputation, currently on torsemide 40 mg daily and clopidogrel --presents to the emergency department for shortness of breath and chest pain.  Patient reports that over the past several weeks he has had increasing difficulties lying flat and exertional dyspnea.  She has had intermittent episodes of chest pain.  She had an episode starting at 8 AM today.  EMS was called and they administered aspirin and nitroglycerin which helped to improve the patient's chest pain.  Patient hypertensive on arrival to the emergency department.  She has noted some increase in swelling in her left lower extremity.  She was told that she had fluid building up in her lungs on recent chest x-ray and her torsemide was increased to 2 tablets a day.  She was also treated for pneumonia."  Physical Exam  BP (!) 202/116    Pulse 92    Temp (!) 97.5 F (36.4 C) (Oral)    Resp (!) 23    Ht 5\' 5"  (1.651 m)    Wt 64.9 kg    SpO2 96%    BMI 23.80 kg/m   Physical Exam Vitals and nursing note reviewed.  Constitutional:      General: She is not in acute distress.    Appearance: She is not diaphoretic.  HENT:     Head: Normocephalic and atraumatic.     Mouth/Throat:     Pharynx: No oropharyngeal exudate.  Eyes:     General: No scleral icterus.    Conjunctiva/sclera: Conjunctivae normal.  Cardiovascular:     Rate and Rhythm: Normal rate and regular rhythm.     Pulses: Normal pulses.     Heart sounds: Normal heart sounds.  Pulmonary:     Effort: Pulmonary effort is normal. No respiratory distress.     Breath sounds: Examination of the right-lower field reveals decreased breath sounds. Examination of the left-lower field reveals decreased breath sounds. Decreased breath sounds  present. No wheezing.  Abdominal:     General: Bowel sounds are normal.     Palpations: Abdomen is soft. There is no mass.     Tenderness: There is no abdominal tenderness. There is no guarding or rebound.  Musculoskeletal:        General: Normal range of motion.     Cervical back: Normal range of motion and neck supple.     Comments: Previous right BKA.  1+ pitting edema noted to left lower extremity.  Skin:    General: Skin is warm and dry.  Neurological:     Mental Status: She is alert.  Psychiatric:        Behavior: Behavior normal.    Procedures  Procedures  ED Course / MDM   Clinical Course as of 01/16/22 1529  Fri Jan 15, 2022  1641 Patient reevaluated after Nitropaste and noted improvement of pain with Nitropaste.  Patient informed about admission to hospital and agreeable at this time. [SB]  1642 Troponin I (High Sensitivity)(!!): 191 [SB]  1846 Consult with cards. Admit to meds and will see in consult [SB]  1930 Consult with Hospitalist, Dr. Bridgett Larsson who recommends controlling blood pressures prior to consideration for admission due to unstable pressures. [SB]  1958 Troponin I (High Sensitivity)(!!): 244 [SB]  1958 B Natriuretic Peptide(!): >4,500.0 [SB]  2001 Went to reevaluate  patient and Dr. Percival Spanish evaluated patient at bedside and recommended admission due to acute decompensated heart failure. [SB]    Clinical Course User Index [SB] Mackenzie Lia A, PA-C   Medical Decision Making Amount and/or Complexity of Data Reviewed Labs: ordered. Decision-making details documented in ED Course. Radiology: ordered.  Risk Prescription drug management. Decision regarding hospitalization.   Plan per previous PA-C at time of sign out: Labs pending, nitroglycerin drip, admission to hospital for chest pain and shortness of breath with elevated blood pressure.   Patient presents to the ED with chest pain and shortness of breath.  Patient has had the shortness of breath for  roughly 3 months that has been acutely worsening.  Patient has a history of hypertension, CKD, diabetes.  She takes torsemide was recently increased to 40 mg.  On exam patient with decreased breath sounds noted throughout bilateral lower lobes.  Previous right BKA, 1+ pitting edema of left lower extremity.  Otherwise no acute cardiovascular, respiratory, abdominal exam findings.  Differential diagnosis includes hypertensive emergency, hypertensive urgency, ACS, pneumonia, heart failure, end-stage renal disease.   Labs:  I personally interpreted labs that were ordered by previous PA-C prior to signout.  The pertinent results include:  Initial troponin elevated at 191. CBC without leukocytosis and hemoglobin improved to 11 from previous. CMP with elevated creatinine at 7.5 and BUN elevated at 51, however improved from previous. COVID and flu swab negative.  Imaging: I independently visualized and interpreted imaging that were ordered by previous PA-C prior to signout.   Imaging studies included chest x-ray.  Chest x-ray showed  IMPRESSION:  Cardiomegaly. Prominence of interstitial markings in the parahilar  regions suggest mild interstitial edema. There is interval increase  in small to moderate bilateral pleural effusions. Evaluation of  lower lung fields for infiltrates is limited by pleural effusions.    I agree with the radiologist interpretation  Medications:  Medications ordered by previous PA-C prior to signout included: Nitropaste, nitroglycerin drip for chest pain. Reevaluation of the patient after these medicines showed that the patient improved I have reviewed the patients home medicines and have made adjustments as needed  Reevaluation: After the interventions noted above, I reevaluated the patient and found that they have :improved  Cardiac Monitoring: The patient was maintained on a cardiac monitor.  I personally viewed and interpreted the cardiac monitored which showed an  underlying rhythm of: Sinus rhythm.   Critical Interventions:  NTG drip initiated by previous PA-C to aid in decreasing blood pressure in the ED.   Consultations: Consult with Cardiologist, Dr. Percival Spanish who notes that he will see the patient in consult.  Discussed with Dr. Percival Spanish at patient bedside regarding patient case, Dr. Percival Spanish suspects acute diastolic heart failure as the cause of patient's chest pain and shortness of breath and elevated trops.  Cardiologist recommends admission to the hospital at this time.  I requested consultation with the hospitalist, Dr. Bridgett Larsson,  and discussed lab and imaging findings as well as pertinent plan - they recommend: Further titrating nitroglycerin drip up and including an additional agent to help further manage patient's blood pressure prior to admission to the hospital.  Recommended range of 413-244 systolic.  After conversation with cardiologist, Dr. Vallarie Mare agreeable to admission at this time.  Disposition: Patient presentation suspicious for acute decompensated heart failure with hypertensive urgency.  After consideration of the diagnostic results and the patients response to treatment, I feel that the patent would benefit from admission to the hospital. Case discussed with  Attending who agrees with admission. Discussed with patient at bedside who is agreeable to admission. Pt appears safe for admission.   This chart was dictated using voice recognition software, Dragon. Despite the best efforts of this provider to proofread and correct errors, errors may still occur which can change documentation meaning.   Rossi Silvestro A, PA-C 01/16/22 1538    Tegeler, Gwenyth Allegra, MD 01/17/22 1355

## 2022-01-15 NOTE — ED Notes (Signed)
IV team at the bedside. 

## 2022-01-15 NOTE — Assessment & Plan Note (Addendum)
Hemoglobin A1c 5.7, well controlled.  Home medications include Lantus 20 units [place] daily, Humalog 3 units TIDAC.

## 2022-01-15 NOTE — ED Notes (Signed)
RN was assisting pt with prosthesis leg pt got fatigue and started feeling nauseous. Notified EDP for medication.

## 2022-01-15 NOTE — ACP (Advance Care Planning) (Signed)
°  Advance Care Planning  Reason for Advance Care Planning Conversation: Acute hospitalization Principal Problem:   Malignant hypertension Active Problems:   Elevated troponin   Acute on chronic diastolic CHF (congestive heart failure) (HCC)   Chronic kidney disease (CKD), stage V (HCC)   Pleural effusion, bilateral   Type 2 diabetes mellitus with renal complication (HCC)   Status post below-knee amputation of right lower extremity (HCC)   Anemia of chronic renal failure   DNR (do not resuscitate)/DNI(Do Not Intubate)    I discussed with patient about advance care planning. Specifically, we discussed whether patient would desire cardiopulmonary resuscitation (CPR) in the event of acute cardiopulmonary arrest. We also discussed whether endotracheal intubation and temporary ventilator life support would be desired in the event of acute cardio- or pulmonary decompensation.   Code status order of DNR has been entered in accord with the patient's wishes. no intubation  Living will: no  Health Care Agent / [name]              Name: Monique Cabrera: Relationship to Patient: dtr   Is agent appointed in legal document no  Time spent today in ACP discussion was  5 mins  Kristopher Oppenheim, DO [facility]

## 2022-01-15 NOTE — Assessment & Plan Note (Addendum)
Patient with Hx of CKD stage 5, follows with [facility].  Per last office notes in December 2022, patient declined renal replacement therapy.  She wanted to avoid hospitalizations.  Patient with creatinine elevated 7.50 with elevated BUN; now agreeing for hemodialysis.  Initially a temporary HD catheter was placed by interventional radiology.  The patient was started on hemodialysis while inpatient.  Vascular surgery was consulted and patient underwent placement of a right IJ tunneled HD catheter and placement of a new left upper arm AV graft by Dr. Scot Dock on 01/29/2022. Continue HD on TTS schedule; outpatient seat obtained at [facility]; first session will be on 01/30/2022.

## 2022-01-15 NOTE — Progress Notes (Signed)
Discussed case with ED APP. Pt's BP needs to be better controlled prior to admission. SBP still >200 and DBP >100.  NTG gtts needs to be uptitrated. Repeat Troponin still not resulted. If SBP of 170-180 cannot be reached to NTG gtts, pt will need cardene or cleviprex gtts and need to be admitted to ICU.

## 2022-01-15 NOTE — Assessment & Plan Note (Addendum)
Patient presenting with progressive shortness of breath, noted elevated BNP greater than 4500 with chest x-ray findings of vascular congestion and worsening pleural effusions.  Likely secondary to worsening renal failure; now started on dialysis. Continue volume management per HD

## 2022-01-15 NOTE — Assessment & Plan Note (Signed)
Has a DNR form that was signed in April 2021.

## 2022-01-15 NOTE — Consult Note (Signed)
Cardiology Consultation:   Patient ID: Monique Cabrera; [id number]; [birth year]   Admit date: 01/15/2022 Date of Consult: 01/15/2022  Primary Care Provider: Wardell Honour, MD Primary Cardiologist: Kathlyn Sacramento, MD  Primary Electrophysiologist:  None   Patient Profile:   Monique Cabrera is a 87 y.o. female with a hx of PVD who is being seen today for the evaluation of chest pain  at the request of Soijett Blue, PAc.  History of Present Illness:   [name] the patient has a history of HTN, PVD, CKD, CVA.    She called EMS today with SOB and chest pain.  She reports that she has had shortness of breath for about 3 months.  She says its been getting worse and she has been to the point of having to sleep upright over about the last month.  She gets around having had a previous right leg amputation with a prosthesis.  She says that she has been very winded trying to do this.  She lives with her daughter.  He says when she gets short of breath she starts to get chest discomfort.  She called EMS.  EMS treated her with ASA and NTG SL.     She has seen Dr. Sallyanne Kuster and Dr. Fletcher Anon for PVD.  She has not had prior CAD documented.  She has a history of chest pain.  I do not see a prior cath.  She has had echo in 2021 with normal EF.       She has had increasing SOB treated with increased Torsemide.    In the ED she was markedly hypertensive .  She was treated with NTG paste and IV NTG.  HS trop has been mildly elevated.  BNP was markedly elevated.     CXR suggested increased volume with edema and pleural effusion.    Of note she has worsening renal insufficiency.  She has been refusing dialysis but she would after talking to her reconsider this.  She does make urine.  She had increasing doses of torsemide without improvement as above.    Past Medical History:  Diagnosis Date   Acute upper respiratory infections of unspecified site    Amputee 08/2019   Anemia    Anemia, unspecified     Arthritis    Atherosclerosis of native arteries of the extremities, unspecified    Chest pain, unspecified    Chronic kidney disease (CKD), stage II (mild)    [facility]   Diabetes mellitus without complication (HCC)    Type II   Diarrhea    Disorder of bone and cartilage, unspecified    Herpes zoster with other nervous system complications(053.19)    History of blood transfusion    Hypercalcemia    Hypertension    Hypertensive renal disease, benign    Nonspecific reaction to tuberculin skin test without active tuberculosis(795.51)    Other and unspecified hyperlipidemia    PAD (peripheral artery disease) ([place])    Per records from [facility]   Pain in joint, lower leg    Peripheral arterial disease ([place])    Postherpetic neuralgia    Proteinuria    Stroke ([place]) 01/2017   Type II or unspecified type diabetes mellitus with renal manifestations, uncontrolled(250.42)    Unspecified disorder of kidney and ureter     Past Surgical History:  Procedure Laterality Date   ABDOMINAL AORTOGRAM W/LOWER EXTREMITY N/A 10/31/2019   Procedure: ABDOMINAL AORTOGRAM W/LOWER EXTREMITY;  Surgeon: Wellington Hampshire, MD;  Location: [facility];  Service: Cardiovascular;  Laterality: N/A;   ABDOMINAL AORTOGRAM W/LOWER EXTREMITY Left 05/12/2020   Procedure: ABDOMINAL AORTOGRAM W/LOWER EXTREMITY;  Surgeon: Waynetta Sandy, MD;  Location: [facility];  Service: Cardiovascular;  Laterality: Left;   AMPUTATION Right 11/02/2019   Procedure: AMPUTATION BELOW KNEE RIGHT;  Surgeon: Rosetta Posner, MD;  Location: [facility] OR;  Service: Vascular;  Laterality: Right;   COLONOSCOPY     x 2   ENDARTERECTOMY FEMORAL Left 05/21/2020   Procedure: LEFT COMMON FEMORAL ENDARTERECTOMY;  Surgeon: Rosetta Posner, MD;  Location: [facility] OR;  Service: Vascular;  Laterality: Left;   FEMORAL-POPLITEAL BYPASS GRAFT Left 05/21/2020   Procedure: LEFT FEMORAL TO BELOW KNEE POPLITEAL ARTERY BYPASS GRAFT;  Surgeon: Rosetta Posner, MD;  Location: [facility] OR;  Service: Vascular;  Laterality: Left;   hysterectomy     INCISION AND DRAINAGE Left 05/27/14   sebacous cyst, ear   PRP Left    Dr. Ricki Miller   removal of cyst from hand     removal of tumor from foot     TONSILLECTOMY       Home Medications:  Prior to Admission medications   Medication Sig Start Date End Date Taking? Authorizing Provider  amLODipine (NORVASC) 10 MG tablet Take 1 tablet (10 mg total) by mouth daily. 10/19/21   Wardell Honour, MD  ASPIRIN LOW DOSE 81 MG EC tablet Take 81 mg by mouth daily. 02/29/20   [provider]  atorvastatin (LIPITOR) 40 MG tablet Take 1 tablet (40 mg total) by mouth daily. 06/10/21   Wardell Honour, MD  calcitRIOL (ROCALTROL) 0.25 MCG capsule Take 0.25 mcg by mouth every other day. Monday, Wednesday, and Friday.    [provider]  carvedilol (COREG) 25 MG tablet TAKE 1 TABLET BY MOUTH TWICE A DAY WITH MEALS 10/19/21   Wardell Honour, MD  cloNIDine (CATAPRES) 0.2 MG tablet TAKE 1 TABLET BY MOUTH 2 TIMES DAILY. 12/04/21   Wardell Honour, MD  clopidogrel (PLAVIX) 75 MG tablet Take 1 tablet (75 mg total) by mouth daily. 09/09/21   Wardell Honour, MD  feeding supplement, GLUCERNA SHAKE, (GLUCERNA SHAKE) LIQD Take 237 mLs by mouth as needed.    [provider]  Glucose 15 GM/32ML GEL Use as needed for low blood sugars 01/08/21   Reed, Tiffany L, DO  glucose blood (ONETOUCH VERIO) test strip USE TO TEST BLOOD SUGAR THREE TIMES DAILY. DX: E11.9 06/05/21   Wardell Honour, MD  insulin lispro (HUMALOG) 100 UNIT/ML injection Inject 0.03 mLs (3 Units total) into the skin 3 (three) times daily after meals. 04/03/20   Reed, Tiffany L, DO  Insulin Pen Needle (B-D ULTRAFINE III SHORT PEN) 31G X 8 MM MISC Use to check blood sugar every day. Dx: 11.29; 11.65 07/04/20   Reed, Tiffany L, DO  LANTUS SOLOSTAR 100 UNIT/ML Solostar Pen Inject 20 Units into the skin daily. 12/25/21   Ngetich, [name] C, NP   Multiple Vitamin (MULTIVITAMIN WITH MINERALS) TABS tablet Take 1 tablet by mouth daily.    [provider]  olmesartan (BENICAR) 20 MG tablet Take 1 tablet (20 mg total) by mouth daily. 02/10/21   Reed, Tiffany L, DO  sodium bicarbonate 650 MG tablet Take 650 mg by mouth 2 (two) times daily. 09/22/21   [provider]  torsemide (DEMADEX) 20 MG tablet Take 1 tablet (20 mg total) by mouth daily. 12/30/21 01/29/22  Ngetich, [employer]  C, NP    Inpatient Medications: Scheduled Meds:  Continuous Infusions:  nitroGLYCERIN 50 mcg/min (01/15/22 1831)   PRN Meds:   Allergies:    Allergies  Allergen Reactions   Invokana [Canagliflozin] Itching, Swelling and Other (See Comments)    Vaginal itching, swelling and irritation   Jardiance [Empagliflozin] Itching, Swelling and Other (See Comments)    Vaginal itching and swelling   Tuberculin Ppd Other (See Comments)    Per records from [place] History:   Social History   Socioeconomic History   Marital status: Widowed    Spouse name: Not on file   Number of children: 6   Years of education: 15   Highest education level: Not on file  Occupational History    Comment: retired, [employer]  Tobacco Use   Smoking status: Former    Types: Cigarettes   Smokeless tobacco: Never   Tobacco comments:    Quit about age 23  " it was really a habit"  Vaping Use   Vaping Use: Never used  Substance and Sexual Activity   Alcohol use: No    Alcohol/week: 0.0 standard drinks   Drug use: No   Sexual activity: Never  Other Topics Concern   Not on file  Social History Narrative   Has moved in with her daughter.   Caffeine- coffee 2-3 cups daily, soda off and on   Social Determinants of Health   Financial Resource Strain: Low Risk    Difficulty of Paying Living Expenses: Not hard at all  Food Insecurity: No Food Insecurity   Worried About Charity fundraiser in the Last Year: Never true   Ran Out of Food in the Last Year:  Never true  Transportation Needs: No Transportation Needs   Lack of Transportation (Medical): No   Lack of Transportation (Non-Medical): No  Physical Activity: Not on file  Stress: Not on file  Social Connections: Not on file  Intimate Partner Violence: Not on file    Family History:    Family History  Problem Relation Age of Onset   Diabetes Mother    Diabetes Father    Diabetes Sister    Diabetes Sister      ROS:  Please see the history of present illness.  ROS  All other ROS reviewed and negative.     Physical Exam/Data:   Vitals:   01/15/22 1630 01/15/22 1645 01/15/22 1730 01/15/22 1800  BP: (!) 217/112 (!) 202/116 (!) 201/122 (!) 201/103  Pulse: 94 92 94 89  Resp: (!) 31 (!) 23 (!) 33 (!) 31  Temp:      TempSrc:      SpO2: 96% 96% 99% 99%  Weight:      Height:       No intake or output data in the 24 hours ending 01/15/22 1931 Filed Weights   01/15/22 1431  Weight: 64.9 kg   Body mass index is 23.8 kg/m.  GENERAL:  Well appearing HEENT:  Pupils equal round and reactive, fundi not visualized, oral mucosa unremarkable NECK:  No jugular venous distention, waveform within normal limits, carotid upstroke brisk and symmetric, no bruits, no thyromegaly LYMPHATICS:  No cervical, inguinal adenopathy LUNGS:  Clear to auscultation bilaterally BACK:  No CVA tenderness CHEST: Bilateral crackles HEART:  PMI not displaced or sustained,S1 and S2 within normal limits, no S3, no S4, no clicks, no rubs, no murmurs ABD:  Flat, positive bowel sounds normal in frequency in pitch, no  bruits, no rebound, no guarding, no midline pulsatile mass, no hepatomegaly, no splenomegaly EXT:  2 plus pulses throughout, no edema, no cyanosis no clubbing SKIN:  No rashes no nodules NEURO:  Cranial nerves II through XII grossly intact, motor grossly intact throughout PSYCH:  Cognitively intact, oriented to person place and time    EKG:  The EKG was personally reviewed and demonstrates:   NSR, rate poor anterior R wave progression.  PVCs.   Telemetry:  Telemetry was personally reviewed and demonstrates:    Relevant CV Studies: NA  Laboratory Data:  Chemistry Recent Labs  Lab 01/15/22 1631  NA 137  K 4.2  CL 104  CO2 18*  GLUCOSE 115*  BUN 51*  CREATININE 7.50*  CALCIUM 9.3  GFRNONAA 5*  ANIONGAP 15    Recent Labs  Lab 01/15/22 1631  PROT 6.5  ALBUMIN 3.0*  AST 23  ALT 17  ALKPHOS 53  BILITOT 1.0   Hematology Recent Labs  Lab 01/15/22 1631  WBC 8.7  RBC 3.43*  HGB 11.0*  HCT 33.7*  MCV 98.3  MCH 32.1  MCHC 32.6  RDW 14.1  PLT 229   Cardiac EnzymesNo results for input(s): TROPONINI in the last 168 hours. No results for input(s): TROPIPOC in the last 168 hours.  BNPNo results for input(s): BNP, PROBNP in the last 168 hours.  DDimer No results for input(s): DDIMER in the last 168 hours.  Radiology/Studies:  DG Chest 2 View  Result Date: 01/15/2022 CLINICAL DATA:  Shortness of breath EXAM: CHEST - 2 VIEW COMPARISON:  12/30/2021 FINDINGS: Transverse diameter of heart is increased. There is interval increase in interstitial markings in the parahilar regions and lower lung fields. There is interval increase in bilateral pleural effusions. Evaluation of lower lung fields for infiltrates is limited by bilateral pleural effusions. There is no pneumothorax. IMPRESSION: Cardiomegaly. Prominence of interstitial markings in the parahilar regions suggest mild interstitial edema. There is interval increase in small to moderate bilateral pleural effusions. Evaluation of lower lung fields for infiltrates is limited by pleural effusions. Electronically Signed   By: Elmer Picker M.D.   On: 01/15/2022 13:19    Assessment and Plan:    CHEST PAIN:    She has chest pain that is precipitated by SOB.  Strongly suspect primary acute coronary syndrome as it is here.  Rather I suspect she has acute diastolic heart failure with hypertensive urgency.  She may well  have coronary artery disease and might need further ischemia work-up if she needs other management as below.  HTN: She is currently having hypertensive urgency.  She is on IV nitroglycerin and I agree with up titration.  She may Cardene.  Her blood pressure is improving.  ACUTE PULMONARY EDEMA: I do suspect probably acute diastolic dysfunction but an echocardiogram is needed to reassess her ejection fraction.  I would suggest IV Lasix 80 mg twice daily for management of pulmonary edema.  However, patient understands that this will likely precipitate further her renal insufficiency.  CKD IV: I did have a long conversation with patient.  She has been refusing dialysis previously but she might consent to this now.  She of course will need to be seen by renal this admission.  I think she is going to need dialysis for volume management most likely.    Discontinue Benicar.  Continue other meds including beta blocker.   For questions or updates, please contact [place] Please consult [email or web address] for contact info under Cardiology/STEMI.  Signed, Minus Breeding, MD  01/15/2022 7:31 PM

## 2022-01-15 NOTE — H&P (Signed)
History and Physical    Monique Cabrera WVP:[id number] DOB: [birth year] DOA: 01/15/2022  PCP: Wardell Honour, MD   Patient coming from: Home  I have personally briefly reviewed patient's old medical records in [place]  CC: CP and SOB HPI: 87 yo AAF with hx of diastolic CHF, CKD stage 5(followed by [facility]), type 2 DM, HTN, s/p right BKA, presents to ER with CP and SOB since 0800 this AM.  Patient states that she has had shortness of breath for well over 3 months now.  It finally got so bad this morning she had to call EMS.  She denies nausea, vomiting.  She did have some chest pain this morning when she was short of breath.  She denies any lower extremity edema.  In the ER, pt noted to hypertensive with SBP >200. Pt with difficult IV access. Took several hours to establish PIV. Started on IV ntg gtts.  Labs showed troponin 191 --> 244. Cardiology consulted.  Scr 7.5 BUN 51. Bicarb 18 BNP >4500  Due to malignant HTN, elevated troponin, CXR consistent with CHF, [facility] hospitalist consulted for admission.   ED Course: hypertensive on arrival. Trop elevated. Cardiology consulted. NTG started.  Review of Systems:  Review of Systems  Constitutional: Negative.   HENT: Negative.    Respiratory:  Positive for cough and shortness of breath.   Cardiovascular:  Positive for chest pain and PND. Negative for leg swelling.  Gastrointestinal: Negative.   Genitourinary: Negative.   Musculoskeletal: Negative.   Skin: Negative.   Neurological: Negative.   Endo/Heme/Allergies: Negative.   Psychiatric/Behavioral: Negative.    All other systems reviewed and are negative.  Past Medical History:  Diagnosis Date   Acute CVA (cerebrovascular accident) ([place]) 02/18/2017   Acute ischemic stroke ([place])    Acute metabolic encephalopathy 5/46/2703   Acute onset of severe vertigo 02/17/2017   Acute upper respiratory infections of unspecified site    Amputee 08/2019   Anemia     Anemia, unspecified    Arthritis    Atherosclerosis of native arteries of the extremities, unspecified    Chest pain, unspecified    Chronic kidney disease (CKD), stage II (mild)    [facility]   Diabetes mellitus without complication ([place])    Type II   Diarrhea    Disorder of bone and cartilage, unspecified    DNR (do not resuscitate)/DNI(Do Not Intubate) 04/08/2020        Gangrene of right foot ([place])    Herpes zoster with other nervous system complications(053.19)    History of blood transfusion    Hypercalcemia    Hypertension    Nonspecific reaction to tuberculin skin test without active tuberculosis(795.51)    Other and unspecified hyperlipidemia    PAD (peripheral artery disease) ([place])    Per records from [facility]   Pain in joint, lower leg    Peripheral arterial disease ([place])    Postherpetic neuralgia    Proteinuria    Stroke ([place]) 01/2017   Type II or unspecified type diabetes mellitus with renal manifestations, uncontrolled(250.42)    Unspecified disorder of kidney and ureter     Past Surgical History:  Procedure Laterality Date   ABDOMINAL AORTOGRAM W/LOWER EXTREMITY N/A 10/31/2019   Procedure: ABDOMINAL AORTOGRAM W/LOWER EXTREMITY;  Surgeon: Wellington Hampshire, MD;  Location: [facility];  Service: Cardiovascular;  Laterality: N/A;   ABDOMINAL AORTOGRAM W/LOWER EXTREMITY Left 05/12/2020   Procedure: ABDOMINAL AORTOGRAM W/LOWER EXTREMITY;  Surgeon: Donzetta Matters,  Georgia Dom, MD;  Location: [facility];  Service: Cardiovascular;  Laterality: Left;   AMPUTATION Right 11/02/2019   Procedure: AMPUTATION BELOW KNEE RIGHT;  Surgeon: Rosetta Posner, MD;  Location[facility] OR;  Service: Vascular;  Laterality: Right;   COLONOSCOPY     x 2   ENDARTERECTOMY FEMORAL Left 05/21/2020   Procedure: LEFT COMMON FEMORAL ENDARTERECTOMY;  Surgeon: Rosetta Posner, MD;  Location: [facility] OR;  Service: Vascular;  Laterality: Left;   FEMORAL-POPLITEAL BYPASS GRAFT Left 05/21/2020    Procedure: LEFT FEMORAL TO BELOW KNEE POPLITEAL ARTERY BYPASS GRAFT;  Surgeon: Rosetta Posner, MD;  Location: [facility] OR;  Service: Vascular;  Laterality: Left;   hysterectomy     INCISION AND DRAINAGE Left 05/27/14   sebacous cyst, ear   PRP Left    Dr. Ricki Miller   removal of cyst from hand     removal of tumor from foot     TONSILLECTOMY       reports that she has quit smoking. Her smoking use included cigarettes. She has never used smokeless tobacco. She reports that she does not drink alcohol and does not use drugs.  Allergies  Allergen Reactions   Invokana [Canagliflozin] Itching, Swelling and Other (See Comments)    Vaginal itching, swelling and irritation   Jardiance [Empagliflozin] Itching, Swelling and Other (See Comments)    Vaginal itching and swelling   Tuberculin Ppd Other (See Comments)    Per records from [place] History  Problem Relation Age of Onset   Diabetes Mother    Diabetes Father    Diabetes Sister    Diabetes Sister     Prior to Admission medications   Medication Sig Start Date End Date Taking? Authorizing Provider  amLODipine (NORVASC) 10 MG tablet Take 1 tablet (10 mg total) by mouth daily. 10/19/21   Wardell Honour, MD  ASPIRIN LOW DOSE 81 MG EC tablet Take 81 mg by mouth daily. 02/29/20   [provider]  atorvastatin (LIPITOR) 40 MG tablet Take 1 tablet (40 mg total) by mouth daily. 06/10/21   Wardell Honour, MD  calcitRIOL (ROCALTROL) 0.25 MCG capsule Take 0.25 mcg by mouth every other day. Monday, Wednesday, and Friday.    [provider]  carvedilol (COREG) 25 MG tablet TAKE 1 TABLET BY MOUTH TWICE A DAY WITH MEALS 10/19/21   Wardell Honour, MD  cloNIDine (CATAPRES) 0.2 MG tablet TAKE 1 TABLET BY MOUTH 2 TIMES DAILY. 12/04/21   Wardell Honour, MD  clopidogrel (PLAVIX) 75 MG tablet Take 1 tablet (75 mg total) by mouth daily. 09/09/21   Wardell Honour, MD  feeding supplement, GLUCERNA SHAKE, (GLUCERNA SHAKE)  LIQD Take 237 mLs by mouth as needed.    [provider]  Glucose 15 GM/32ML GEL Use as needed for low blood sugars 01/08/21   Reed, Tiffany L, DO  glucose blood (ONETOUCH VERIO) test strip USE TO TEST BLOOD SUGAR THREE TIMES DAILY. DX: E11.9 06/05/21   Wardell Honour, MD  insulin lispro (HUMALOG) 100 UNIT/ML injection Inject 0.03 mLs (3 Units total) into the skin 3 (three) times daily after meals. 04/03/20   Reed, Tiffany L, DO  Insulin Pen Needle (B-D ULTRAFINE III SHORT PEN) 31G X 8 MM MISC Use to check blood sugar every day. Dx: 11.29; 11.65 07/04/20   Reed, Tiffany L, DO  LANTUS SOLOSTAR 100 UNIT/ML Solostar Pen Inject 20 Units into the skin daily. 12/25/21  Ngetich, [name] C, NP  Multiple Vitamin (MULTIVITAMIN WITH MINERALS) TABS tablet Take 1 tablet by mouth daily.    [provider]  olmesartan (BENICAR) 20 MG tablet Take 1 tablet (20 mg total) by mouth daily. 02/10/21   Reed, Tiffany L, DO  sodium bicarbonate 650 MG tablet Take 650 mg by mouth 2 (two) times daily. 09/22/21   [provider]  torsemide (DEMADEX) 20 MG tablet Take 1 tablet (20 mg total) by mouth daily. 12/30/21 01/29/22  Ngetich, Nelda Bucks, NP    Physical Exam: Vitals:   01/15/22 1645 01/15/22 1730 01/15/22 1800 01/15/22 1930  BP: (!) 202/116 (!) 201/122 (!) 201/103 (!) 190/102  Pulse: 92 94 89 90  Resp: (!) 23 (!) 33 (!) 31 (!) 27  Temp:      TempSrc:      SpO2: 96% 99% 99% 98%  Weight:      Height:        Physical Exam Vitals and nursing note reviewed.  Constitutional:      General: She is not in acute distress.    Appearance: Normal appearance. She is normal weight. She is not ill-appearing, toxic-appearing or diaphoretic.  HENT:     Head: Normocephalic and atraumatic.     Nose: Nose normal. No rhinorrhea.  Eyes:     General: No scleral icterus. Cardiovascular:     Rate and Rhythm: Normal rate and regular rhythm.     Pulses: Normal pulses.  Pulmonary:     Effort: No respiratory  distress.     Breath sounds: Examination of the right-lower field reveals decreased breath sounds. Examination of the left-lower field reveals decreased breath sounds. Decreased breath sounds present.  Abdominal:     General: Abdomen is flat. Bowel sounds are normal. There is no distension.     Palpations: Abdomen is soft.     Tenderness: There is no abdominal tenderness. There is no guarding or rebound.  Musculoskeletal:     Comments: Right BKA No left LE edema  Skin:    General: Skin is warm and dry.     Capillary Refill: Capillary refill takes less than 2 seconds.  Neurological:     General: No focal deficit present.     Mental Status: She is alert and oriented to person, place, and time.     Labs on Admission: I have personally reviewed following labs and imaging studies  CBC: Recent Labs  Lab 01/15/22 1631  WBC 8.7  NEUTROABS 6.5  HGB 11.0*  HCT 33.7*  MCV 98.3  PLT 892   Basic Metabolic Panel: Recent Labs  Lab 01/15/22 1631  NA 137  K 4.2  CL 104  CO2 18*  GLUCOSE 115*  BUN 51*  CREATININE 7.50*  CALCIUM 9.3   GFR: Estimated Creatinine Clearance: 5 mL/min (A) (by C-G formula based on SCr of 7.5 mg/dL (H)). Liver Function Tests: Recent Labs  Lab 01/15/22 1631  AST 23  ALT 17  ALKPHOS 53  BILITOT 1.0  PROT 6.5  ALBUMIN 3.0*   No results for input(s): LIPASE, AMYLASE in the last 168 hours. No results for input(s): AMMONIA in the last 168 hours. Coagulation Profile: No results for input(s): INR, PROTIME in the last 168 hours. Cardiac Enzymes: No results for input(s): CKTOTAL, CKMB, CKMBINDEX, TROPONINI in the last 168 hours. BNP (last 3 results) No results for input(s): PROBNP in the last 8760 hours. HbA1C: No results for input(s): HGBA1C in the last 72 hours. CBG: No results for input(s):  GLUCAP in the last 168 hours. Lipid Profile: No results for input(s): CHOL, HDL, LDLCALC, TRIG, CHOLHDL, LDLDIRECT in the last 72 hours. Thyroid Function  Tests: No results for input(s): TSH, T4TOTAL, FREET4, T3FREE, THYROIDAB in the last 72 hours. Anemia Panel: No results for input(s): VITAMINB12, FOLATE, FERRITIN, TIBC, IRON, RETICCTPCT in the last 72 hours. Urine analysis:    Component Value Date/Time   COLORURINE YELLOW 05/21/2020 0805   APPEARANCEUR CLEAR 05/21/2020 0805   LABSPEC 1.012 05/21/2020 0805   PHURINE 6.0 05/21/2020 0805   GLUCOSEU 150 (A) 05/21/2020 0805   HGBUR NEGATIVE 05/21/2020 0805   BILIRUBINUR NEGATIVE 05/21/2020 0805   KETONESUR NEGATIVE 05/21/2020 0805   PROTEINUR 100 (A) 05/21/2020 0805   UROBILINOGEN 0.2 07/30/2014 1945   NITRITE NEGATIVE 05/21/2020 0805   LEUKOCYTESUR NEGATIVE 05/21/2020 0805    Radiological Exams on Admission: I have personally reviewed images DG Chest 2 View  Result Date: 01/15/2022 CLINICAL DATA:  Shortness of breath EXAM: CHEST - 2 VIEW COMPARISON:  12/30/2021 FINDINGS: Transverse diameter of heart is increased. There is interval increase in interstitial markings in the parahilar regions and lower lung fields. There is interval increase in bilateral pleural effusions. Evaluation of lower lung fields for infiltrates is limited by bilateral pleural effusions. There is no pneumothorax. IMPRESSION: Cardiomegaly. Prominence of interstitial markings in the parahilar regions suggest mild interstitial edema. There is interval increase in small to moderate bilateral pleural effusions. Evaluation of lower lung fields for infiltrates is limited by pleural effusions. Electronically Signed   By: Elmer Picker M.D.   On: 01/15/2022 13:19    EKG: I have personally reviewed EKG: NSR   Assessment/Plan Principal Problem:   Malignant hypertension Active Problems:   Elevated troponin   Acute on chronic diastolic CHF (congestive heart failure) (HCC)   Chronic kidney disease (CKD), stage V (HCC)   Pleural effusion, bilateral   Type 2 diabetes mellitus with renal complication (HCC)   Status post  below-knee amputation of right lower extremity (HCC)   Anemia of chronic renal failure   DNR (do not resuscitate)/DNI(Do Not Intubate)    Malignant hypertension Admit to progressive telemetry bed. Continue with IV NTG gtts. Target SBP 170-180s and DBP 90-100 for next 24 hours. Needs repeat echo.  Elevated troponin Cardiology has been consulted to manage pt's elevated troponin. Check echo in AM. EKG without acute ST-T changes.   Acute on chronic diastolic CHF (congestive heart failure) (HCC) Diuresis will be difficult with Scr >8. May need nephrology consult if pt does not want to pursue palliative care. See A/P on CKD stage V  Chronic kidney disease (CKD), stage V ([place]) Has known CKD stage 5. Followed by [facility].  Per last office notes in December 2022, patient declined renal replacement therapy.  She wanted to avoid hospitalizations.  Continue oral bicarbonate.  May need to stop her ARB. Pt may benefit from seeing nephrology during hospitalization. If she still does not want renal replacement therapy, then palliative care consult would be next step.  See note from 11/2021 nephrology visit   Pleural effusion, bilateral On RA. If SOB becomes an issues, may need bilateral thoracenteses.  Status post below-knee amputation of right lower extremity (HCC) Chronic.  Anemia of chronic renal failure Chronic.  Type 2 diabetes mellitus with renal complication (HCC) Chronic. Check A1c.   DNR (do not resuscitate)/DNI(Do Not Intubate) Has a DNR form that was signed in April 2021.     DVT prophylaxis: SQ Heparin Code Status: DNR/DNI(Do NOT  Intubate). Verified DNR/DNI status with patient Family Communication: no family at bedside  Disposition Plan: return home  Consults called: EDP has consulted cardiology  Admission status: Inpatient,  progressive unit   Kristopher Oppenheim, DO [facility] 01/15/2022, 8:29 PM

## 2022-01-15 NOTE — Assessment & Plan Note (Addendum)
Upon presentation to the ED, SBP's noted to be in the 200s.  Likely secondary to worsening renal failure, volume overload.  Patient was started on hemodialysis.  Continue metoprolol tartrate 50 mg p.o. twice daily, hydralazine 100 mg p.o. 3 times daily.  Outpatient follow-up with PCP.

## 2022-01-15 NOTE — ED Provider Triage Note (Signed)
Emergency Medicine Provider Triage Evaluation Note  Monique Cabrera , a 87 y.o. female  was evaluated in triage.  Pt complains of shortness of breath  Review of Systems  Positive: Short of breath Negative: fever  Physical Exam  BP (!) 184/98 (BP Location: Left Arm)    Pulse 92    Temp (!) 97.5 F (36.4 C) (Oral)    Resp 18    SpO2 96%  Gen:   Awake, no distress   Resp:  Normal effort  MSK:   Moves extremities without difficulty  Other:    Medical Decision Making  Medically screening exam initiated at 12:54 PM.  Appropriate orders placed.  Monique Cabrera was informed that the remainder of the evaluation will be completed by another provider, this initial triage assessment does not replace that evaluation, and the importance of remaining in the ED until their evaluation is complete.     Monique Cabrera, [place] 01/15/22 1255

## 2022-01-15 NOTE — ED Provider Notes (Signed)
[facility] EMERGENCY DEPARTMENT Provider Note   CSN: [id number] Arrival date & time: 01/15/22  1217     History  Chief Complaint  Patient presents with   Shortness of [place] is a 87 y.o. female.  Patient with history of hypertension, insulin-dependent diabetes, high cholesterol, peripheral arterial disease status post right-sided below the knee amputation, currently on torsemide 40 mg daily and clopidogrel --presents to the emergency department for shortness of breath and chest pain.  Patient reports that over the past several weeks he has had increasing difficulties lying flat and exertional dyspnea.  She has had intermittent episodes of chest pain.  She had an episode starting at 8 AM today.  EMS was called and they administered aspirin and nitroglycerin which helped to improve the patient's chest pain.  Patient hypertensive on arrival to the emergency department.  She has noted some increase in swelling in her left lower extremity.  She was told that she had fluid building up in her lungs on recent chest x-ray and her torsemide was increased to 2 tablets a day.  She was also treated for pneumonia.      Home Medications Prior to Admission medications   Medication Sig Start Date End Date Taking? Authorizing Provider  amLODipine (NORVASC) 10 MG tablet Take 1 tablet (10 mg total) by mouth daily. 10/19/21   Wardell Honour, MD  ASPIRIN LOW DOSE 81 MG EC tablet Take 81 mg by mouth daily. 02/29/20   [provider]  atorvastatin (LIPITOR) 40 MG tablet Take 1 tablet (40 mg total) by mouth daily. 06/10/21   Wardell Honour, MD  calcitRIOL (ROCALTROL) 0.25 MCG capsule Take 0.25 mcg by mouth every other day. Monday, Wednesday, and Friday.    [provider]  carvedilol (COREG) 25 MG tablet TAKE 1 TABLET BY MOUTH TWICE A DAY WITH MEALS 10/19/21   Wardell Honour, MD  cloNIDine (CATAPRES) 0.2 MG tablet TAKE 1 TABLET BY MOUTH 2 TIMES  DAILY. 12/04/21   Wardell Honour, MD  clopidogrel (PLAVIX) 75 MG tablet Take 1 tablet (75 mg total) by mouth daily. 09/09/21   Wardell Honour, MD  feeding supplement, GLUCERNA SHAKE, (GLUCERNA SHAKE) LIQD Take 237 mLs by mouth as needed.    [provider]  Glucose 15 GM/32ML GEL Use as needed for low blood sugars 01/08/21   Reed, Tiffany L, DO  glucose blood (ONETOUCH VERIO) test strip USE TO TEST BLOOD SUGAR THREE TIMES DAILY. DX: E11.9 06/05/21   Wardell Honour, MD  insulin lispro (HUMALOG) 100 UNIT/ML injection Inject 0.03 mLs (3 Units total) into the skin 3 (three) times daily after meals. 04/03/20   Reed, Tiffany L, DO  Insulin Pen Needle (B-D ULTRAFINE III SHORT PEN) 31G X 8 MM MISC Use to check blood sugar every day. Dx: 11.29; 11.65 07/04/20   Reed, Tiffany L, DO  LANTUS SOLOSTAR 100 UNIT/ML Solostar Pen Inject 20 Units into the skin daily. 12/25/21   Ngetich, [name] C, NP  Multiple Vitamin (MULTIVITAMIN WITH MINERALS) TABS tablet Take 1 tablet by mouth daily.    [provider]  olmesartan (BENICAR) 20 MG tablet Take 1 tablet (20 mg total) by mouth daily. 02/10/21   Reed, Tiffany L, DO  sodium bicarbonate 650 MG tablet Take 650 mg by mouth 2 (two) times daily. 09/22/21   [provider]  torsemide (DEMADEX) 20 MG tablet Take 1 tablet (20 mg total) by mouth daily. 12/30/21  01/29/22  Ngetich, Nelda Bucks, NP      Allergies    Invokana [canagliflozin], Jardiance [empagliflozin], and Tuberculin ppd    Review of Systems   Review of Systems  Physical Exam Updated Vital Signs BP (!) 184/98 (BP Location: Left Arm)    Pulse 92    Temp (!) 97.5 F (36.4 C) (Oral)    Resp 18    SpO2 96%  Physical Exam Vitals and nursing note reviewed.  Constitutional:      General: She is not in acute distress.    Appearance: She is well-developed.  HENT:     Head: Normocephalic and atraumatic.     Right Ear: External ear normal.     Left Ear: External ear normal.     Nose: Nose  normal.  Eyes:     Conjunctiva/sclera: Conjunctivae normal.  Cardiovascular:     Rate and Rhythm: Normal rate and regular rhythm.     Heart sounds: No murmur heard. Pulmonary:     Effort: No respiratory distress.     Breath sounds: Examination of the right-lower field reveals decreased breath sounds. Examination of the left-lower field reveals decreased breath sounds. Decreased breath sounds present. No wheezing, rhonchi or rales.  Abdominal:     Palpations: Abdomen is soft.     Tenderness: There is no abdominal tenderness. There is no guarding or rebound.  Musculoskeletal:     Cervical back: Normal range of motion and neck supple.     Comments: Previous right BKA.  1+ pitting edema left lower extremity.  Skin:    General: Skin is warm and dry.     Findings: No rash.  Neurological:     General: No focal deficit present.     Mental Status: She is alert. Mental status is at baseline.     Motor: No weakness.  Psychiatric:        Mood and Affect: Mood normal.    ED Results / Procedures / Treatments   Labs (all labs ordered are listed, but only abnormal results are displayed) Labs Reviewed  RESP PANEL BY RT-PCR (FLU A&B, COVID) ARPGX2  CBC WITH DIFFERENTIAL/PLATELET  COMPREHENSIVE METABOLIC PANEL  BRAIN NATRIURETIC PEPTIDE  TROPONIN I (HIGH SENSITIVITY)  TROPONIN I (HIGH SENSITIVITY)    ED ECG REPORT   Date: 01/15/2022  Rate: 91  Rhythm: normal sinus rhythm  QRS Axis: normal  Intervals: normal  ST/T Wave abnormalities: nonspecific T wave changes  Conduction Disutrbances:none  Narrative Interpretation:   Old EKG Reviewed: unchanged  I have personally reviewed the EKG tracing and agree with the computerized printout as noted.  Radiology DG Chest 2 View  Result Date: 01/15/2022 CLINICAL DATA:  Shortness of breath EXAM: CHEST - 2 VIEW COMPARISON:  12/30/2021 FINDINGS: Transverse diameter of heart is increased. There is interval increase in interstitial markings in the  parahilar regions and lower lung fields. There is interval increase in bilateral pleural effusions. Evaluation of lower lung fields for infiltrates is limited by bilateral pleural effusions. There is no pneumothorax. IMPRESSION: Cardiomegaly. Prominence of interstitial markings in the parahilar regions suggest mild interstitial edema. There is interval increase in small to moderate bilateral pleural effusions. Evaluation of lower lung fields for infiltrates is limited by pleural effusions. Electronically Signed   By: Elmer Picker M.D.   On: 01/15/2022 13:19    Procedures Procedures    Medications Ordered in ED Medications  nitroGLYCERIN 50 mg in dextrose 5 % 250 mL (0.2 mg/mL) infusion (has no administration in  time range)    ED Course/ Medical Decision Making/ A&P   Patient seen and examined. History obtained directly from patient as well as family at bedside and previous clinic notes in epic.   Labs/EKG: EKG independently reviewed and interpreted as above.  CBC, BMP, BNP, troponin ordered and pending.  Also sent COVID testing.  Imaging: Independently reviewed and interpreted.  This included: Chest x-ray which demonstrates bilateral pleural effusions.  Medications/Fluids: Ordered: Nitroglycerin paste until line can be established.  Otherwise patient will be receiving IV nitroglycerin for blood pressure control.  Considered diuretic, will hold off until labs result.  Most recent vital signs reviewed and are as follows: BP (!) 217/117    Pulse 95    Temp (!) 97.5 F (36.4 C) (Oral)    Resp (!) 23    Ht 5\' 5"  (1.651 m)    Wt 64.9 kg    SpO2 98%    BMI 23.80 kg/m   Initial impression: Hypertensive emergency with chest pain which is now resolved and shortness of breath which is progressively worsening.  Concern for fluid overload.  CRITICAL CARE Performed by: Carlisle Cater PA-C Total critical care time: 35 minutes Critical care time was exclusive of separately billable procedures  and treating other patients. Critical care was necessary to treat or prevent imminent or life-threatening deterioration. Critical care was time spent personally by me on the following activities: development of treatment plan with patient and/or surrogate as well as nursing, discussions with consultants, evaluation of patient's response to treatment, examination of patient, obtaining history from patient or surrogate, ordering and performing treatments and interventions, ordering and review of laboratory studies, ordering and review of radiographic studies, pulse oximetry and re-evaluation of patient's condition.                           Medical Decision Making Amount and/or Complexity of Data Reviewed Labs: ordered. Radiology: ordered.  Risk Prescription drug management.          Final Clinical Impression(s) / ED Diagnoses Final diagnoses:  Hypertensive emergency  SOB (shortness of breath)  Chest pain, unspecified type  Pleural effusion    Rx / DC Orders ED Discharge Orders     None         Carlisle Cater, PA-C 01/15/22 1531    Dorie Rank, MD 01/16/22 [phone]

## 2022-01-15 NOTE — ED Notes (Signed)
Unable to get labs x2

## 2022-01-16 ENCOUNTER — Inpatient Hospital Stay (HOSPITAL_COMMUNITY): Payer: Medicare Other

## 2022-01-16 DIAGNOSIS — I10 Essential (primary) hypertension: Secondary | ICD-10-CM | POA: Diagnosis not present

## 2022-01-16 DIAGNOSIS — Z89511 Acquired absence of right leg below knee: Secondary | ICD-10-CM

## 2022-01-16 DIAGNOSIS — N185 Chronic kidney disease, stage 5: Secondary | ICD-10-CM | POA: Diagnosis not present

## 2022-01-16 DIAGNOSIS — D631 Anemia in chronic kidney disease: Secondary | ICD-10-CM

## 2022-01-16 DIAGNOSIS — R778 Other specified abnormalities of plasma proteins: Secondary | ICD-10-CM | POA: Diagnosis not present

## 2022-01-16 DIAGNOSIS — E1122 Type 2 diabetes mellitus with diabetic chronic kidney disease: Secondary | ICD-10-CM

## 2022-01-16 DIAGNOSIS — I5033 Acute on chronic diastolic (congestive) heart failure: Secondary | ICD-10-CM | POA: Diagnosis not present

## 2022-01-16 LAB — CBC WITH DIFFERENTIAL/PLATELET
Abs Immature Granulocytes: 0.04 10*3/uL (ref 0.00–0.07)
Basophils Absolute: 0.1 10*3/uL (ref 0.0–0.1)
Basophils Relative: 1 %
Eosinophils Absolute: 0 10*3/uL (ref 0.0–0.5)
Eosinophils Relative: 0 %
HCT: 28.3 % — ABNORMAL LOW (ref 36.0–46.0)
Hemoglobin: 9 g/dL — ABNORMAL LOW (ref 12.0–15.0)
Immature Granulocytes: 1 %
Lymphocytes Relative: 17 %
Lymphs Abs: 1.3 10*3/uL (ref 0.7–4.0)
MCH: 30.6 pg (ref 26.0–34.0)
MCHC: 31.8 g/dL (ref 30.0–36.0)
MCV: 96.3 fL (ref 80.0–100.0)
Monocytes Absolute: 0.7 10*3/uL (ref 0.1–1.0)
Monocytes Relative: 9 %
Neutro Abs: 5.6 10*3/uL (ref 1.7–7.7)
Neutrophils Relative %: 72 %
Platelets: 210 10*3/uL (ref 150–400)
RBC: 2.94 MIL/uL — ABNORMAL LOW (ref 3.87–5.11)
RDW: 14 % (ref 11.5–15.5)
WBC: 7.7 10*3/uL (ref 4.0–10.5)
nRBC: 0 % (ref 0.0–0.2)

## 2022-01-16 LAB — COMPREHENSIVE METABOLIC PANEL
ALT: 11 U/L (ref 0–44)
AST: 19 U/L (ref 15–41)
Albumin: 2.6 g/dL — ABNORMAL LOW (ref 3.5–5.0)
Alkaline Phosphatase: 47 U/L (ref 38–126)
Anion gap: 14 (ref 5–15)
BUN: 56 mg/dL — ABNORMAL HIGH (ref 8–23)
CO2: 17 mmol/L — ABNORMAL LOW (ref 22–32)
Calcium: 8.7 mg/dL — ABNORMAL LOW (ref 8.9–10.3)
Chloride: 106 mmol/L (ref 98–111)
Creatinine, Ser: 7.59 mg/dL — ABNORMAL HIGH (ref 0.44–1.00)
GFR, Estimated: 5 mL/min — ABNORMAL LOW (ref >60)
Glucose, Bld: 103 mg/dL — ABNORMAL HIGH (ref 70–99)
Potassium: 4.3 mmol/L (ref 3.5–5.1)
Sodium: 137 mmol/L (ref 135–145)
Total Bilirubin: 0.7 mg/dL (ref 0.3–1.2)
Total Protein: 5.4 g/dL — ABNORMAL LOW (ref 6.5–8.1)

## 2022-01-16 LAB — MAGNESIUM: Magnesium: 1.9 mg/dL (ref 1.7–2.4)

## 2022-01-16 LAB — TROPONIN I (HIGH SENSITIVITY)
Troponin I (High Sensitivity): 578 ng/L (ref <18)
Troponin I (High Sensitivity): 449 ng/L (ref <18)
Troponin I (High Sensitivity): 3297 ng/L (ref <18)

## 2022-01-16 LAB — GLUCOSE, CAPILLARY
Glucose-Capillary: 114 mg/dL — ABNORMAL HIGH (ref 70–99)
Glucose-Capillary: 156 mg/dL — ABNORMAL HIGH (ref 70–99)
Glucose-Capillary: 168 mg/dL — ABNORMAL HIGH (ref 70–99)
Glucose-Capillary: 181 mg/dL — ABNORMAL HIGH (ref 70–99)
Glucose-Capillary: 205 mg/dL — ABNORMAL HIGH (ref 70–99)

## 2022-01-16 MED ORDER — HYDRALAZINE HCL 25 MG PO TABS
25.0000 mg | ORAL_TABLET | Freq: Three times a day (TID) | ORAL | Status: DC
  Administered 2022-01-17 – 2022-01-21 (×9): 25 mg via ORAL
  Filled 2022-01-16 (×10): qty 1

## 2022-01-16 MED ORDER — FUROSEMIDE 10 MG/ML IJ SOLN
40.0000 mg | Freq: Two times a day (BID) | INTRAMUSCULAR | Status: DC
  Administered 2022-01-16: 40 mg via INTRAVENOUS
  Filled 2022-01-16: qty 4

## 2022-01-16 MED ORDER — METOPROLOL TARTRATE 5 MG/5ML IV SOLN
5.0000 mg | Freq: Three times a day (TID) | INTRAVENOUS | Status: DC | PRN
  Administered 2022-01-16: 5 mg via INTRAVENOUS
  Filled 2022-01-16: qty 5

## 2022-01-16 MED ORDER — INSULIN ASPART 100 UNIT/ML IJ SOLN
0.0000 [IU] | INTRAMUSCULAR | Status: DC
  Administered 2022-01-16 – 2022-01-24 (×9): 1 [IU] via SUBCUTANEOUS

## 2022-01-16 MED ORDER — PANTOPRAZOLE SODIUM 40 MG IV SOLR
40.0000 mg | INTRAVENOUS | Status: DC
  Administered 2022-01-16 – 2022-01-20 (×4): 40 mg via INTRAVENOUS
  Filled 2022-01-16 (×5): qty 40

## 2022-01-16 MED ORDER — METOPROLOL TARTRATE 25 MG PO TABS
25.0000 mg | ORAL_TABLET | Freq: Four times a day (QID) | ORAL | Status: DC
  Administered 2022-01-17 – 2022-01-26 (×31): 25 mg via ORAL
  Filled 2022-01-16 (×33): qty 1

## 2022-01-16 MED ORDER — SODIUM CHLORIDE 0.9 % IV SOLN
12.5000 mg | Freq: Once | INTRAVENOUS | Status: AC
  Administered 2022-01-16: 12.5 mg via INTRAVENOUS
  Filled 2022-01-16 (×2): qty 0.5

## 2022-01-16 MED ORDER — CARVEDILOL 3.125 MG PO TABS: 3.1250 mg | ORAL_TABLET | Freq: Two times a day (BID) | ORAL | Status: DC

## 2022-01-16 MED ORDER — HEPARIN SODIUM (PORCINE) 5000 UNIT/ML IJ SOLN
5000.0000 [IU] | Freq: Three times a day (TID) | INTRAMUSCULAR | Status: DC
  Administered 2022-01-16: 5000 [IU] via SUBCUTANEOUS
  Filled 2022-01-16: qty 1

## 2022-01-16 MED ORDER — CLOPIDOGREL BISULFATE 75 MG PO TABS
75.0000 mg | ORAL_TABLET | Freq: Every day | ORAL | Status: DC
  Administered 2022-01-17 – 2022-01-20 (×4): 75 mg via ORAL
  Filled 2022-01-16 (×4): qty 1

## 2022-01-16 NOTE — Progress Notes (Signed)
°   01/16/22 1153  Assess: MEWS Score  Temp 98 F (36.7 C)  BP (!) 168/98  Pulse Rate (!) 112  ECG Heart Rate (!) 113  Resp 18  SpO2 96 %  O2 Device Nasal Cannula  O2 Flow Rate (L/min) 2 L/min  Assess: MEWS Score  MEWS Temp 0  MEWS Systolic 0  MEWS Pulse 2  MEWS RR 0  MEWS LOC 0  MEWS Score 2  MEWS Score Color Yellow  Assess: if the MEWS score is Yellow or Red  Were vital signs taken at a resting state? Yes  Focused Assessment No change from prior assessment  Early Detection of Sepsis Score *See Row Information* Low  MEWS guidelines implemented *See Row Information* No, previously yellow, continue vital signs every 4 hours  Treat  MEWS Interventions Administered prn meds/treatments (client given prn nausea medicaton)  Pain Scale 0-10  Pain Score 0  Take Vital Signs  Increase Vital Sign Frequency  Yellow: Q 2hr X 2 then Q 4hr X 2, if remains yellow, continue Q 4hrs  Escalate  MEWS: Escalate Yellow: discuss with charge nurse/RN and consider discussing with provider and RRT  Notify: Charge Nurse/RN  Name of Charge Nurse/RN Notified [email or web address]  Date Charge Nurse/RN Notified 01/16/22  Time Charge Nurse/RN Notified 1159  Notify: Provider  Provider Name/Title [email or web address]  Date Provider Notified 01/16/22  Time Provider Notified 1200  Notification Type Page  Notification Reason Other (Comment) (yellow mews)  Provider response No new orders (continue to monitor pt)  Date of Provider Response 01/16/22  Time of Provider Response [id number]  Document  Progress note created (see row info) Yes

## 2022-01-16 NOTE — Progress Notes (Signed)
Subjective:  Some nausea and vomiting Had Zofran   Objective:  Vitals:   01/16/22 0120 01/16/22 0230 01/16/22 0514 01/16/22 0745  BP: (!) 196/107 (!) 179/83 (!) 172/94 (!) 177/93  Pulse: 91 86 98 100  Resp: 20  20 20   Temp: 97.7 F (36.5 C)  98.1 F (36.7 C) 98.1 F (36.7 C)  TempSrc: Oral  Oral Oral  SpO2: 99% 99% 97% 100%  Weight: 61.1 kg     Height:        Intake/Output from previous day: No intake or output data in the 24 hours ending 01/16/22 1006  Physical Exam: Affect appropriate Chronically ill black female  HEENT: normal Neck supple with no adenopathy JVP normal no bruits no thyromegaly Lungs clear with no wheezing and good diaphragmatic motion Heart:  S1/S2 S4 gallop no murmur, no rub, gallop or click PMI normal Abdomen: benighn, BS positve, no tenderness, no AAA no bruit.  No HSM or HJR PVD RLE amputation    Lab Results: Basic Metabolic Panel: Recent Labs    01/15/22 1631 01/16/22 0222  NA 137 137  K 4.2 4.3  CL 104 106  CO2 18* 17*  GLUCOSE 115* 103*  BUN 51* 56*  CREATININE 7.50* 7.59*  CALCIUM 9.3 8.7*  MG  --  1.9   Liver Function Tests: Recent Labs    01/15/22 1631 01/16/22 0222  AST 23 19  ALT 17 11  ALKPHOS 53 47  BILITOT 1.0 0.7  PROT 6.5 5.4*  ALBUMIN 3.0* 2.6*   No results for input(s): LIPASE, AMYLASE in the last 72 hours. CBC: Recent Labs    01/15/22 1631 01/16/22 0222  WBC 8.7 7.7  NEUTROABS 6.5 5.6  HGB 11.0* 9.0*  HCT 33.7* 28.3*  MCV 98.3 96.3  PLT 229 210     Imaging: DG Chest 2 View  Result Date: 01/15/2022 CLINICAL DATA:  Shortness of breath EXAM: CHEST - 2 VIEW COMPARISON:  12/30/2021 FINDINGS: Transverse diameter of heart is increased. There is interval increase in interstitial markings in the parahilar regions and lower lung fields. There is interval increase in bilateral pleural effusions. Evaluation of lower lung fields for infiltrates is limited by bilateral pleural effusions. There is no  pneumothorax. IMPRESSION: Cardiomegaly. Prominence of interstitial markings in the parahilar regions suggest mild interstitial edema. There is interval increase in small to moderate bilateral pleural effusions. Evaluation of lower lung fields for infiltrates is limited by pleural effusions. Electronically Signed   By: Elmer Picker M.D.   On: 01/15/2022 13:19    Cardiac Studies:  ECG: SR PVC no acute changes    Telemetry:  NSR 01/16/2022   Echo: 02/09/20 EF 60-65%   Medications:    aspirin EC  81 mg Oral Daily   insulin aspart  0-5 Units Subcutaneous QHS   insulin aspart  0-6 Units Subcutaneous TID WC      nitroGLYCERIN 85 mcg/min (01/16/22 0519)   promethazine (PHENERGAN) injection (IM or IVPB)      Assessment/Plan:  87 y.o. with history CRF, PVD post RLE amputation HTN and CVA admitted with dyspnea and chest pain   Chest Pain:  f/u echo pending ECG non acute fairly flat torponin curev 191-> 449 Pain free this am with nasuea and vomiting ? Uremic symptoms No plans for cath given advance renal failure continue iv nitro ASA add plavix HTN:  BP improved add hydralazine and beta blocker  CRF:  she has been refusing dialysis but admission likely from uremia  with volume overload, chest pain nausea and vomiting per primary service/nephrology Consider [place] to r/o obstruction US 02/03/21 with echogenicity consistent with chronic renal disease ARB stopped on admission   Jenkins Rouge 01/16/2022, 10:06 AM

## 2022-01-16 NOTE — Progress Notes (Signed)
Dr. Marlowe Sax paged about pt complaining of pain. Pt not able to tolerate any medications PO at this time due to nausea and vomiting. Awaiting call back from MD.

## 2022-01-16 NOTE — Progress Notes (Signed)
PROGRESS NOTE  ZYA FINKLE NGE:[id number] DOB: [birth year] DOA: 01/15/2022 PCP: Wardell Honour, MD  HPI/Recap of past 24 hours: 87 yo female with hx of diastolic CHF, CKD stage 5(followed by [facility]), type 2 DM, HTN, s/p right BKA, presents to ER with CP and worsening SOB X 1 day. Patient states that she has had shortness of breath for well over 3 months, which has been progressively worsening. Reported some chest pain, when she was short of breath. She denies any lower extremity edema. In the ER, pt noted to hypertensive with SBP >200. Pt with difficult IV access. Took several hours to establish PIV. Started on IV ntg gtts. Labs showed troponin 191 --> 244. Cardiology consulted. Scr 7.5 BUN 51. Bicarb 18, BNP >4500. Due to malignant HTN, elevated troponin, CXR consistent with CHF, [facility] consulted for admission.       Today, patient continues to feel poorly, with persistent nausea and vomiting brownish liquid, denies currently any chest pain or worsening shortness of breath.  Patient overall with poor prognosis.   Assessment/Plan: Principal Problem:   Malignant hypertension Active Problems:   Type 2 diabetes mellitus with renal complication (HCC)   Status post below-knee amputation of right lower extremity (HCC)   Chronic kidney disease (CKD), stage V (HCC)   Anemia of chronic renal failure   Elevated troponin   Pleural effusion, bilateral   Acute on chronic diastolic CHF (congestive heart failure) (HCC)   DNR (do not resuscitate)/DNI(Do Not Intubate)    Hypertensive crisis Presented with SBP in the 200s, slowly improving within target Chest x-ray with interstitial edema, bilateral pleural effusion Echo pending Cardiology on board, continue nitroglycerin drip and titrate pending BP, recommend adding Coreg, hydralazine once able to tolerate Holding home amlodipine, clonidine, Benicar for now Monitor closely and progressive unit  Chest pain Elevated  troponins Troponins currently downtrending, with a peak of 578 EKG with no acute ST changes Echo pending Cardiology consulted, no plans for cath given advanced renal failure, recommend continuing nitro drip, start aspirin, Lipitor and Plavix once able to tolerate Telemetry, monitor closely  History of ?chronic diastolic HF BNP >4,010 (CKD stage V) Chest x-ray with interstitial edema, bilateral pleural effusion Last echo done on 01/2020 showed EF of 60 to 65%, no regional wall motion abnormality Repeat echo pending Unable to diurese due to CKD stage V (pending nephrology recommendation) Hold home torsemide for now Monitor closely  Bilateral pleural effusion Currently on room air If worsening SOB, may need thoracentesis  CKD stage V Possibly uremic symptoms/ACS with nausea/vomiting Has been refusing hemodialysis, still making urine Noted acidosis Nephrology consulted, appreciate recs Hold home calcitriol, sodium bicarb until able to tolerate orally  Intractable nausea/vomiting Likely 2/2 uremic symptoms versus ACS Noted brownish vomitus KUB pending, may need CT if persistent Antiemetics, supportive care  Anemia of chronic kidney disease Baseline around 9 Anemia panel done on 12/2021 showed iron 67, sats 27%, ferritin 733 Daily CBC  Diabetes mellitus type 2 Last A1c on 11/2021-->5.9 SSI, Accu-Cheks, hypoglycemic protocol Hold home Lantus, lispro  S/p BKA of right lower extremity Hx of PVD Chronic Restart home Plavix once able    Estimated body mass index is 22.42 kg/m as calculated from the following:   Height as of this encounter: 5\' 5"  (1.651 m).   Weight as of this encounter: 61.1 kg.     Code Status: DNR  Family Communication: None at bedside  Disposition Plan: Status is: Inpatient  Remains inpatient appropriate because:  Level of care    Consultants: Cardiology Nephrology  Procedures: None  Antimicrobials: None  DVT prophylaxis: Heparin  Lindy   Objective: Vitals:   01/16/22 0230 01/16/22 0514 01/16/22 0745 01/16/22 1153  BP: (!) 179/83 (!) 172/94 (!) 177/93 (!) 168/98  Pulse: 86 98 100 (!) 112  Resp:  20 20 18   Temp:  98.1 F (36.7 C) 98.1 F (36.7 C) 98 F (36.7 C)  TempSrc:  Oral Oral Axillary  SpO2: 99% 97% 100% 96%  Weight:      Height:       No intake or output data in the 24 hours ending 01/16/22 1333 Filed Weights   01/15/22 1431 01/16/22 0120  Weight: 64.9 kg 61.1 kg    Exam: General: Acutely ill-appearing Cardiovascular: S1, S2 present Respiratory: Bibasilar crackles Abdomen: Soft, nontender, nondistended, bowel sounds present Musculoskeletal: No bilateral pedal edema noted Skin: Normal Psychiatry: Fair mood     Data Reviewed: CBC: Recent Labs  Lab 01/15/22 1631 01/16/22 0222  WBC 8.7 7.7  NEUTROABS 6.5 5.6  HGB 11.0* 9.0*  HCT 33.7* 28.3*  MCV 98.3 96.3  PLT 229 086   Basic Metabolic Panel: Recent Labs  Lab 01/15/22 1631 01/16/22 0222  NA 137 137  K 4.2 4.3  CL 104 106  CO2 18* 17*  GLUCOSE 115* 103*  BUN 51* 56*  CREATININE 7.50* 7.59*  CALCIUM 9.3 8.7*  MG  --  1.9   GFR: Estimated Creatinine Clearance: 5 mL/min (A) (by C-G formula based on SCr of 7.59 mg/dL (H)). Liver Function Tests: Recent Labs  Lab 01/15/22 1631 01/16/22 0222  AST 23 19  ALT 17 11  ALKPHOS 53 47  BILITOT 1.0 0.7  PROT 6.5 5.4*  ALBUMIN 3.0* 2.6*   No results for input(s): LIPASE, AMYLASE in the last 168 hours. No results for input(s): AMMONIA in the last 168 hours. Coagulation Profile: No results for input(s): INR, PROTIME in the last 168 hours. Cardiac Enzymes: No results for input(s): CKTOTAL, CKMB, CKMBINDEX, TROPONINI in the last 168 hours. BNP (last 3 results) No results for input(s): PROBNP in the last 8760 hours. HbA1C: No results for input(s): HGBA1C in the last 72 hours. CBG: Recent Labs  Lab 01/15/22 2105 01/16/22 0743 01/16/22 1154  GLUCAP 115* 114* 156*   Lipid  Profile: No results for input(s): CHOL, HDL, LDLCALC, TRIG, CHOLHDL, LDLDIRECT in the last 72 hours. Thyroid Function Tests: No results for input(s): TSH, T4TOTAL, FREET4, T3FREE, THYROIDAB in the last 72 hours. Anemia Panel: No results for input(s): VITAMINB12, FOLATE, FERRITIN, TIBC, IRON, RETICCTPCT in the last 72 hours. Urine analysis:    Component Value Date/Time   COLORURINE YELLOW 05/21/2020 0805   APPEARANCEUR CLEAR 05/21/2020 0805   LABSPEC 1.012 05/21/2020 0805   PHURINE 6.0 05/21/2020 0805   GLUCOSEU 150 (A) 05/21/2020 0805   HGBUR NEGATIVE 05/21/2020 0805   BILIRUBINUR NEGATIVE 05/21/2020 0805   KETONESUR NEGATIVE 05/21/2020 0805   PROTEINUR 100 (A) 05/21/2020 0805   UROBILINOGEN 0.2 07/30/2014 1945   NITRITE NEGATIVE 05/21/2020 0805   LEUKOCYTESUR NEGATIVE 05/21/2020 0805   Sepsis Labs: @LABRCNTIP (procalcitonin:4,lacticidven:4)  ) Recent Results (from the past 240 hour(s))  Resp Panel by RT-PCR (Flu A&B, Covid) Nasopharyngeal Swab     Status: None   Collection Time: 01/15/22  1:36 PM   Specimen: Nasopharyngeal Swab; Nasopharyngeal(NP) swabs in vial transport medium  Result Value Ref Range Status   SARS Coronavirus 2 by RT PCR NEGATIVE NEGATIVE Final    Comment: (NOTE)  SARS-CoV-2 target nucleic acids are NOT DETECTED.  The SARS-CoV-2 RNA is generally detectable in upper respiratory specimens during the acute phase of infection. The lowest concentration of SARS-CoV-2 viral copies this assay can detect is 138 copies/mL. A negative result does not preclude SARS-Cov-2 infection and should not be used as the sole basis for treatment or other patient management decisions. A negative result may occur with  improper specimen collection/handling, submission of specimen other than nasopharyngeal swab, presence of viral mutation(s) within the areas targeted by this assay, and inadequate number of viral copies(<138 copies/mL). A negative result must be combined  with clinical observations, patient history, and epidemiological information. The expected result is Negative.  Fact Sheet for Patients:  EntrepreneurPulse.com.au  Fact Sheet for Healthcare Providers:  IncredibleEmployment.be  This test is no t yet approved or cleared by the [place] FDA and  has been authorized for detection and/or diagnosis of SARS-CoV-2 by FDA under an Emergency Use Authorization (EUA). This EUA will remain  in effect (meaning this test can be used) for the duration of the COVID-19 declaration under Section 564(b)(1) of the Act, 21 U.S.C.section 360bbb-3(b)(1), unless the authorization is terminated  or revoked sooner.       Influenza A by PCR NEGATIVE NEGATIVE Final   Influenza B by PCR NEGATIVE NEGATIVE Final    Comment: (NOTE) The Xpert Xpress SARS-CoV-2/FLU/RSV plus assay is intended as an aid in the diagnosis of influenza from Nasopharyngeal swab specimens and should not be used as a sole basis for treatment. Nasal washings and aspirates are unacceptable for Xpert Xpress SARS-CoV-2/FLU/RSV testing.  Fact Sheet for Patients: EntrepreneurPulse.com.au  Fact Sheet for Healthcare Providers: IncredibleEmployment.be  This test is not yet approved or cleared by the [place] FDA and has been authorized for detection and/or diagnosis of SARS-CoV-2 by FDA under an Emergency Use Authorization (EUA). This EUA will remain in effect (meaning this test can be used) for the duration of the COVID-19 declaration under Section 564(b)(1) of the Act, 21 U.S.C. section 360bbb-3(b)(1), unless the authorization is terminated or revoked.  Performed at [facility], [place] [address] [place], [place] [address]       Studies: No results found.  Scheduled Meds:  aspirin EC  81 mg Oral Daily   carvedilol  3.125 mg Oral BID WC   hydrALAZINE  25 mg Oral Q8H   insulin aspart  0-5 Units  Subcutaneous QHS   insulin aspart  0-6 Units Subcutaneous TID WC    Continuous Infusions:  nitroGLYCERIN 85 mcg/min (01/16/22 0519)     LOS: 1 day     Alma Friendly, MD [facility]  If 7PM-7AM, please contact night-coverage [email or web address] 01/16/2022, 1:33 PM

## 2022-01-16 NOTE — Plan of Care (Signed)
°  Problem: Clinical Measurements: Goal: Will remain free from infection Outcome: Progressing   Problem: Nutrition: Goal: Adequate nutrition will be maintained Outcome: Not Progressing

## 2022-01-16 NOTE — Progress Notes (Signed)
Received call back from Dr. Marlowe Sax to give pt Tylenol suppository at this time for chest pain 8/10. Pt states hurts more when taking a deep breath. MD also gave orders to get a STAT EKG and Troponin.

## 2022-01-16 NOTE — Plan of Care (Addendum)
Worsening chest pain and troponin 449 -> 3297. Nitro gtt Please start heparin gtt acs protocol Recommend metoprolol 25 mg q6hr for goal HR < 70 bpm (ordered, upitrate as needed) Increase nitro gtt to chest pain, goal SBP < 130 Recommend dilaudid if okay per primary Would not cath Monique Cabrera unless she were to agree to dialysis given ESRD

## 2022-01-16 NOTE — Progress Notes (Addendum)
Dr. Marlowe Sax notified about pt's elevated troponin from 449 to 3,297.   Dr. Gaynelle Arabian with cardiology paged about pt current status. Pt still having 8/10 chest pain as well to left anterior chest. EKG already obtained, NTG drip being triturated up, O2 remains in place on pt. Discussed with pt the importance of leaving O2 in place at this time. Awaiting call back.

## 2022-01-17 ENCOUNTER — Inpatient Hospital Stay (HOSPITAL_COMMUNITY): Payer: Medicare Other

## 2022-01-17 DIAGNOSIS — R778 Other specified abnormalities of plasma proteins: Secondary | ICD-10-CM | POA: Diagnosis not present

## 2022-01-17 DIAGNOSIS — I5031 Acute diastolic (congestive) heart failure: Secondary | ICD-10-CM | POA: Diagnosis not present

## 2022-01-17 DIAGNOSIS — Z515 Encounter for palliative care: Secondary | ICD-10-CM

## 2022-01-17 DIAGNOSIS — I161 Hypertensive emergency: Secondary | ICD-10-CM

## 2022-01-17 DIAGNOSIS — N185 Chronic kidney disease, stage 5: Secondary | ICD-10-CM | POA: Diagnosis not present

## 2022-01-17 DIAGNOSIS — I10 Essential (primary) hypertension: Secondary | ICD-10-CM | POA: Diagnosis not present

## 2022-01-17 DIAGNOSIS — R112 Nausea with vomiting, unspecified: Secondary | ICD-10-CM

## 2022-01-17 DIAGNOSIS — I5033 Acute on chronic diastolic (congestive) heart failure: Secondary | ICD-10-CM | POA: Diagnosis not present

## 2022-01-17 DIAGNOSIS — I214 Non-ST elevation (NSTEMI) myocardial infarction: Secondary | ICD-10-CM

## 2022-01-17 DIAGNOSIS — Z66 Do not resuscitate: Secondary | ICD-10-CM

## 2022-01-17 DIAGNOSIS — R0602 Shortness of breath: Secondary | ICD-10-CM

## 2022-01-17 LAB — RENAL FUNCTION PANEL
Albumin: 2.7 g/dL — ABNORMAL LOW (ref 3.5–5.0)
Anion gap: 17 — ABNORMAL HIGH (ref 5–15)
BUN: 67 mg/dL — ABNORMAL HIGH (ref 8–23)
CO2: 15 mmol/L — ABNORMAL LOW (ref 22–32)
Calcium: 9.1 mg/dL (ref 8.9–10.3)
Chloride: 106 mmol/L (ref 98–111)
Creatinine, Ser: 8.12 mg/dL — ABNORMAL HIGH (ref 0.44–1.00)
GFR, Estimated: 5 mL/min — ABNORMAL LOW (ref >60)
Glucose, Bld: 202 mg/dL — ABNORMAL HIGH (ref 70–99)
Phosphorus: 9.8 mg/dL — ABNORMAL HIGH (ref 2.5–4.6)
Potassium: 5.1 mmol/L (ref 3.5–5.1)
Sodium: 138 mmol/L (ref 135–145)

## 2022-01-17 LAB — CBC WITH DIFFERENTIAL/PLATELET
Abs Immature Granulocytes: 0.07 10*3/uL (ref 0.00–0.07)
Basophils Absolute: 0 10*3/uL (ref 0.0–0.1)
Basophils Relative: 0 %
Eosinophils Absolute: 0 10*3/uL (ref 0.0–0.5)
Eosinophils Relative: 0 %
HCT: 28.7 % — ABNORMAL LOW (ref 36.0–46.0)
Hemoglobin: 9 g/dL — ABNORMAL LOW (ref 12.0–15.0)
Immature Granulocytes: 1 %
Lymphocytes Relative: 19 %
Lymphs Abs: 1.5 10*3/uL (ref 0.7–4.0)
MCH: 30.6 pg (ref 26.0–34.0)
MCHC: 31.4 g/dL (ref 30.0–36.0)
MCV: 97.6 fL (ref 80.0–100.0)
Monocytes Absolute: 0.7 10*3/uL (ref 0.1–1.0)
Monocytes Relative: 9 %
Neutro Abs: 5.6 10*3/uL (ref 1.7–7.7)
Neutrophils Relative %: 71 %
Platelets: 192 10*3/uL (ref 150–400)
RBC: 2.94 MIL/uL — ABNORMAL LOW (ref 3.87–5.11)
RDW: 13.9 % (ref 11.5–15.5)
WBC: 7.9 10*3/uL (ref 4.0–10.5)
nRBC: 0 % (ref 0.0–0.2)

## 2022-01-17 LAB — ECHOCARDIOGRAM COMPLETE
Height: 65 in
S' Lateral: 2.9 cm
Single Plane A4C EF: 38.4 %
Weight: 2151.69 oz

## 2022-01-17 LAB — LIPID PANEL
Cholesterol: 160 mg/dL (ref 0–200)
HDL: 61 mg/dL (ref >40)
LDL Cholesterol: 83 mg/dL (ref 0–99)
Total CHOL/HDL Ratio: 2.6 RATIO
Triglycerides: 82 mg/dL (ref <150)
VLDL: 16 mg/dL (ref 0–40)

## 2022-01-17 LAB — GLUCOSE, CAPILLARY
Glucose-Capillary: 192 mg/dL — ABNORMAL HIGH (ref 70–99)
Glucose-Capillary: 169 mg/dL — ABNORMAL HIGH (ref 70–99)
Glucose-Capillary: 190 mg/dL — ABNORMAL HIGH (ref 70–99)
Glucose-Capillary: 181 mg/dL — ABNORMAL HIGH (ref 70–99)
Glucose-Capillary: 178 mg/dL — ABNORMAL HIGH (ref 70–99)

## 2022-01-17 LAB — TROPONIN I (HIGH SENSITIVITY): Troponin I (High Sensitivity): 6368 ng/L (ref <18)

## 2022-01-17 LAB — HEPARIN LEVEL (UNFRACTIONATED)
Heparin Unfractionated: 0.92 IU/mL — ABNORMAL HIGH (ref 0.30–0.70)
Heparin Unfractionated: 0.88 IU/mL — ABNORMAL HIGH (ref 0.30–0.70)
Heparin Unfractionated: 0.27 IU/mL — ABNORMAL LOW (ref 0.30–0.70)

## 2022-01-17 LAB — LIPASE, BLOOD: Lipase: 23 U/L (ref 11–51)

## 2022-01-17 MED ORDER — HYDROMORPHONE HCL 1 MG/ML IJ SOLN
0.5000 mg | Freq: Once | INTRAMUSCULAR | Status: AC | PRN
  Administered 2022-01-17: 0.5 mg via INTRAVENOUS
  Filled 2022-01-17: qty 1

## 2022-01-17 MED ORDER — FENTANYL CITRATE PF 50 MCG/ML IJ SOSY
12.5000 ug | PREFILLED_SYRINGE | INTRAMUSCULAR | Status: DC | PRN
  Administered 2022-01-17 – 2022-01-18 (×6): 12.5 ug via INTRAVENOUS
  Filled 2022-01-17 (×6): qty 1

## 2022-01-17 MED ORDER — HEPARIN BOLUS VIA INFUSION
3500.0000 [IU] | Freq: Once | INTRAVENOUS | Status: AC
  Administered 2022-01-17: 3500 [IU] via INTRAVENOUS
  Filled 2022-01-17: qty 3500

## 2022-01-17 MED ORDER — SODIUM CHLORIDE 0.9 % IV SOLN
12.5000 mg | Freq: Four times a day (QID) | INTRAVENOUS | Status: DC | PRN
  Filled 2022-01-17: qty 0.5

## 2022-01-17 MED ORDER — PROMETHAZINE HCL 25 MG/ML IJ SOLN: 12.5000 mg | Freq: Four times a day (QID) | INTRAMUSCULAR | Status: DC | PRN

## 2022-01-17 MED ORDER — ONDANSETRON 4 MG PO TBDP
4.0000 mg | ORAL_TABLET | Freq: Four times a day (QID) | ORAL | Status: DC | PRN
  Administered 2022-01-17: 4 mg via ORAL
  Filled 2022-01-17 (×2): qty 1

## 2022-01-17 MED ORDER — HEPARIN (PORCINE) 25000 UT/250ML-% IV SOLN
650.0000 [IU]/h | INTRAVENOUS | Status: DC
  Administered 2022-01-17: 750 [IU]/h via INTRAVENOUS
  Administered 2022-01-18 – 2022-01-20 (×2): 650 [IU]/h via INTRAVENOUS
  Filled 2022-01-17 (×3): qty 250

## 2022-01-17 MED ORDER — METOCLOPRAMIDE HCL 5 MG/ML IJ SOLN
5.0000 mg | Freq: Three times a day (TID) | INTRAMUSCULAR | Status: DC
  Administered 2022-01-17 – 2022-01-29 (×37): 5 mg via INTRAVENOUS
  Filled 2022-01-17 (×38): qty 2

## 2022-01-17 NOTE — Progress Notes (Signed)
Pt pulled IV out. IV team on floor to assess. Jessie Foot, RN

## 2022-01-17 NOTE — Progress Notes (Signed)
Spoke with Dr Marlowe Sax regarding pt refusing PO medications, specifically Hydralazine and Metoprolol. Continue Ntg gtt and no new orders at this time. Jessie Foot, RN

## 2022-01-17 NOTE — Progress Notes (Signed)
Subjective:  Had lots of nausea and pain yesterday Looks much better this am But troponin increased to  6368  She seems to have significant dementia And has refused dialysis in past. Grandson indicates one of her sons is health care power of attorney   Objective:  Vitals:   01/17/22 0539 01/17/22 0608 01/17/22 0757 01/17/22 1007  BP: 124/74 132/75 (!) 165/87 (!) 161/93  Pulse: 80 80 92 95  Resp:   20   Temp:   (!) 97.5 F (36.4 C)   TempSrc:   Axillary   SpO2: 95% 95% 98%   Weight:      Height:        Intake/Output from previous day:  Intake/Output Summary (Last 24 hours) at 01/17/2022 1017 Last data filed at 01/17/2022 1015 Gross per 24 hour  Intake 855.82 ml  Output 515 ml  Net 340.82 ml    Physical Exam: Affect appropriate Chronically ill black female  HEENT: normal Neck supple with no adenopathy JVP normal no bruits no thyromegaly Lungs clear with no wheezing and good diaphragmatic motion Heart:  S1/S2 S4 gallop no murmur, no rub, gallop or click PMI normal Abdomen: benighn, BS positve, no tenderness, no AAA no bruit.  No HSM or HJR PVD RLE amputation    Lab Results: Basic Metabolic Panel: Recent Labs    01/16/22 0222 01/17/22 0249  NA 137 138  K 4.3 5.1  CL 106 106  CO2 17* 15*  GLUCOSE 103* 202*  BUN 56* 67*  CREATININE 7.59* 8.12*  CALCIUM 8.7* 9.1  MG 1.9  --   PHOS  --  9.8*   Liver Function Tests: Recent Labs    01/15/22 1631 01/16/22 0222 01/17/22 0249  AST 23 19  --   ALT 17 11  --   ALKPHOS 53 47  --   BILITOT 1.0 0.7  --   PROT 6.5 5.4*  --   ALBUMIN 3.0* 2.6* 2.7*   No results for input(s): LIPASE, AMYLASE in the last 72 hours. CBC: Recent Labs    01/16/22 0222 01/17/22 0249  WBC 7.7 7.9  NEUTROABS 5.6 5.6  HGB 9.0* 9.0*  HCT 28.3* 28.7*  MCV 96.3 97.6  PLT 210 192     Imaging: DG Chest 2 View  Result Date: 01/15/2022 CLINICAL DATA:  Shortness of breath EXAM: CHEST - 2 VIEW COMPARISON:  12/30/2021 FINDINGS:  Transverse diameter of heart is increased. There is interval increase in interstitial markings in the parahilar regions and lower lung fields. There is interval increase in bilateral pleural effusions. Evaluation of lower lung fields for infiltrates is limited by bilateral pleural effusions. There is no pneumothorax. IMPRESSION: Cardiomegaly. Prominence of interstitial markings in the parahilar regions suggest mild interstitial edema. There is interval increase in small to moderate bilateral pleural effusions. Evaluation of lower lung fields for infiltrates is limited by pleural effusions. Electronically Signed   By: Elmer Picker M.D.   On: 01/15/2022 13:19   DG Abd Portable 1V  Result Date: 01/16/2022 CLINICAL DATA:  Nausea and vomiting. EXAM: PORTABLE ABDOMEN - 1 VIEW COMPARISON:  02/09/2020 FINDINGS: 1459 hours. No gaseous bowel dilatation to suggest obstruction. Dense wall calcification noted in the splenic artery, abdominal aorta and iliac arteries bilaterally. IMPRESSION: 1. Nonobstructive bowel gas pattern. 2. Dense wall calcification in the splenic artery and iliac arteries. 3.  Aortic Atherosclerois (ICD10-170.0) Electronically Signed   By: Misty Stanley M.D.   On: 01/16/2022 15:18    Cardiac Studies:  ECG: SR PVC no acute changes    Telemetry:  NSR 01/17/2022   Echo: 02/09/20 EF 60-65%   Medications:    aspirin EC  81 mg Oral Daily   clopidogrel  75 mg Oral Daily   hydrALAZINE  25 mg Oral Q8H   insulin aspart  0-6 Units Subcutaneous Q4H   metoprolol tartrate  25 mg Oral Q6H   pantoprazole (PROTONIX) IV  40 mg Intravenous Q24H      heparin 750 Units/hr (01/17/22 0306)   nitroGLYCERIN 100 mcg/min (01/17/22 0447)    Assessment/Plan:  87 y.o. with history CRF, PVD post RLE amputation HTN and CVA admitted with dyspnea and chest pain with evolving SEMI   SEMI:  she clearly has evolved an MI with troponin up to 6368.  ECG with no acute ST elevation She looks better this am on iv  heparin / nitro Continue ASA/Plavix and beta blocker Cardiology hands are tied as she has prohibitive renal failure age, dementia TTE is pending but suspect EF will now be down which will worsen renal perfusion May still consider cath if she consents to dialysis latter next week Overall prognosis appears poor If no dialysis done consider palliative consult  Jenkins Rouge 01/17/2022, 10:17 AM

## 2022-01-17 NOTE — Progress Notes (Addendum)
Dr. Marlowe Sax paged pt has not voided and bladder scan revealed 416 in bladder. Awaiting call back. Received call back from Dr. Marlowe Sax orders are being place to do in and out cath on pt as well as f/u bladder scan. See chart for order details.

## 2022-01-17 NOTE — Progress Notes (Signed)
Pt with no output since the I & O cath was performed this morning. Provider made aware. Bladder scan only showing 45cc of urine. Will continue to monitor pt and await any new orders.

## 2022-01-17 NOTE — Progress Notes (Signed)
ANTICOAGULATION CONSULT NOTE - Initial Consult  Pharmacy Consult for heparin Indication: chest pain/ACS  Allergies  Allergen Reactions   Invokana [Canagliflozin] Itching, Swelling and Other (See Comments)    Vaginal itching, swelling and irritation   Jardiance [Empagliflozin] Itching, Swelling and Other (See Comments)    Vaginal itching and swelling   Tuberculin Ppd Other (See Comments)    Per records from [name]     Patient Measurements: Height: 5\' 5"  (165.1 cm) Weight: 61.1 kg (134 lb 11.2 oz) IBW/kg (Calculated) : 57 Heparin Dosing Weight: 61 kg   Vital Signs: Temp: 97.7 F (36.5 C) (01/21 2330) Temp Source: Axillary (01/21 2330) BP: 164/95 (01/22 0130) Pulse Rate: 89 (01/22 0130)  Labs: Recent Labs    01/15/22 1631 01/15/22 1818 01/16/22 0222 01/16/22 0457 01/16/22 0700 01/16/22 2148  HGB 11.0*  --  9.0*  --   --   --   HCT 33.7*  --  28.3*  --   --   --   PLT 229  --  210  --   --   --   CREATININE 7.50*  --  7.59*  --   --   --   TROPONINIHS 191*   < >  --  578* 449* 3,297*   < > = values in this interval not displayed.    Estimated Creatinine Clearance: 5 mL/min (A) (by C-G formula based on SCr of 7.59 mg/dL (H)).   Medical History: Past Medical History:  Diagnosis Date   Acute CVA (cerebrovascular accident) ([place]) 02/18/2017   Acute ischemic stroke ([place])    Acute metabolic encephalopathy 3/90/3009   Acute onset of severe vertigo 02/17/2017   Acute upper respiratory infections of unspecified site    Amputee 08/2019   Anemia    Anemia, unspecified    Arthritis    Atherosclerosis of native arteries of the extremities, unspecified    Chest pain, unspecified    Chronic kidney disease (CKD), stage II (mild)    [facility]   Diabetes mellitus without complication ([place])    Type II   Diarrhea    Disorder of bone and cartilage, unspecified    DNR (do not resuscitate)/DNI(Do Not Intubate) 04/08/2020        Gangrene of right foot (HCC)     Herpes zoster with other nervous system complications(053.19)    History of blood transfusion    Hypercalcemia    Hypertension    Nonspecific reaction to tuberculin skin test without active tuberculosis(795.51)    Other and unspecified hyperlipidemia    PAD (peripheral artery disease) ([place])    Per records from [facility]   Pain in joint, lower leg    Peripheral arterial disease ([place])    Postherpetic neuralgia    Proteinuria    Stroke ([place]) 01/2017   Type II or unspecified type diabetes mellitus with renal manifestations, uncontrolled(250.42)    Unspecified disorder of kidney and ureter     Medications:  Medications Prior to Admission  Medication Sig Dispense Refill Last Dose   acetaminophen (TYLENOL) 500 MG tablet Take 1,000 mg by mouth every 6 (six) hours as needed for moderate pain or headache.   01/14/2022   acetaminophen (TYLENOL) 650 MG CR tablet Take 1,300 mg by mouth every 8 (eight) hours as needed for pain.   Past Month   amLODipine (NORVASC) 10 MG tablet Take 1 tablet (10 mg total) by mouth daily. 90 tablet 2 01/14/2022   ASPIRIN LOW DOSE 81 MG EC tablet Take  81 mg by mouth daily.   01/14/2022   atorvastatin (LIPITOR) 40 MG tablet Take 1 tablet (40 mg total) by mouth daily. 90 tablet 1 01/14/2022   calcitRIOL (ROCALTROL) 0.25 MCG capsule Take 0.25 mcg by mouth daily.   01/14/2022   carvedilol (COREG) 25 MG tablet TAKE 1 TABLET BY MOUTH TWICE A DAY WITH MEALS (Patient taking differently: Take 25 mg by mouth 2 (two) times daily with a meal.) 180 tablet 1 01/14/2022 at 2300   cloNIDine (CATAPRES) 0.2 MG tablet TAKE 1 TABLET BY MOUTH 2 TIMES DAILY. (Patient taking differently: Take 0.2 mg by mouth 2 (two) times daily.) 180 tablet 1 01/14/2022   clopidogrel (PLAVIX) 75 MG tablet Take 1 tablet (75 mg total) by mouth daily. 90 tablet 1 01/14/2022   feeding supplement, GLUCERNA SHAKE, (GLUCERNA SHAKE) LIQD Take 237 mLs by mouth as needed (nutritional support). strawberry   Past Month    glucose 5 g chewable tablet Chew 15 g by mouth as needed for low blood sugar.   01/14/2022   insulin lispro (HUMALOG) 100 UNIT/ML injection Inject 0.03 mLs (3 Units total) into the skin 3 (three) times daily after meals. (Patient taking differently: Inject 3 Units into the skin See admin instructions. 3 times daily with meals **Do not take if blood sugar is less 150**) 10 mL 11 Past Week   Insulin Pen Needle (B-D ULTRAFINE III SHORT PEN) 31G X 8 MM MISC Use to check blood sugar every day. Dx: 11.29; 11.65 100 each 1    LANTUS SOLOSTAR 100 UNIT/ML Solostar Pen Inject 20 Units into the skin daily. 15 mL 3 01/14/2022   Multiple Vitamin (MULTIVITAMIN WITH MINERALS) TABS tablet Take 1 tablet by mouth daily.   01/14/2022   olmesartan (BENICAR) 20 MG tablet Take 1 tablet (20 mg total) by mouth daily. 90 tablet 3 01/14/2022   sodium bicarbonate 650 MG tablet Take 650 mg by mouth 2 (two) times daily.   01/14/2022   torsemide (DEMADEX) 20 MG tablet Take 1 tablet (20 mg total) by mouth daily. 30 tablet 0 01/14/2022   Glucose 15 GM/32ML GEL Use as needed for low blood sugars (Patient not taking: Reported on 01/15/2022) 32 mL 5 Not Taking   glucose blood (ONETOUCH VERIO) test strip USE TO TEST BLOOD SUGAR THREE TIMES DAILY. DX: E11.9 (Patient taking differently: daily after breakfast.) 300 strip 3     Assessment: 86 YOF with chest pain with significant bump on troponin. Pharmacy consulted to start IV heparin for ACS.   H/H low, Plt wnl. H/o CKD stage V   Goal of Therapy:  Heparin level 0.3-0.7 units/ml Monitor platelets by anticoagulation protocol: Yes   Plan:  -Heparin 3500 units IV bolus followed by heparin infusion at 750 units/hr  -F/u 8 hr HL -Monitor daily HL, CBC and s/s of bleeding   Albertina Parr, PharmD., BCPS, BCCCP Clinical Pharmacist Please refer to [facility] for unit-specific pharmacist

## 2022-01-17 NOTE — Progress Notes (Addendum)
Pt seen in room.  Pt refuses again today, as she did yesterday, to discuss any details or wishes either for or against doing dialysis.  She states that she sees Dr Johnney Ou in outpatient setting for her kidney problems.  Refusing exam as well.  Pt appears a bit agitated and upset. We cannot make any recommendations for this case at this time.    Addendum (8:00 pm): Spoke w/ pt's renal MD, Dr Johnney Ou.  Pt has repeatedly in the outpatient setting declined the option of dialysis. Dr Johnney Ou has asked her on multiple visits about doing dialysis when the time comes and she has always declined.  She has also been asked that if her life depended on it, would she still decline dialysis, and her answer has always been "yes", she would still decline dialysis.  Dr Johnney Ou states that she has really been very straightforward in her with these discussions. Also she notes that Ms. Nilsen has been always been put together, well dressed at her appts , very polite and with no signs of dementia.    In speaking w/ her tonight she was restless and a bit agitated, suspect she some may have some degree of uremia w/ creatinine of 8.    There is a family/ pt/ palliative meeting tomorrow at 2 pm to discuss these issues.    Kelly Splinter, MD 01/17/2022, 7:37 PM

## 2022-01-17 NOTE — Progress Notes (Signed)
Pt woke up very disoriented, unable to start IV. Has a working IV to [name].

## 2022-01-17 NOTE — Progress Notes (Signed)
ANTICOAGULATION CONSULT NOTE - Follow Up Consult  Pharmacy Consult for Heparin Indication: chest pain/ACS  Allergies  Allergen Reactions   Invokana [Canagliflozin] Itching, Swelling and Other (See Comments)    Vaginal itching, swelling and irritation   Jardiance [Empagliflozin] Itching, Swelling and Other (See Comments)    Vaginal itching and swelling   Tuberculin Ppd Other (See Comments)    Per records from [facility]     Patient Measurements: Height: 5\' 5"  (165.1 cm) Weight: 61 kg (134 lb 7.7 oz) IBW/kg (Calculated) : 57 Heparin Dosing Weight: 64.9 kg  Vital Signs: Temp: 97.8 F (36.6 C) (01/22 1200) Temp Source: Oral (01/22 1200) BP: 159/87 (01/22 1200) Pulse Rate: 89 (01/22 1200)  Labs: Recent Labs    01/15/22 1631 01/15/22 1818 01/16/22 0222 01/16/22 0457 01/16/22 0700 01/16/22 2148 01/17/22 0249 01/17/22 1105 01/17/22 1200  HGB 11.0*  --  9.0*  --   --   --  9.0*  --   --   HCT 33.7*  --  28.3*  --   --   --  28.7*  --   --   PLT 229  --  210  --   --   --  192  --   --   HEPARINUNFRC  --   --   --   --   --   --   --  0.92* 0.88*  CREATININE 7.50*  --  7.59*  --   --   --  8.12*  --   --   TROPONINIHS 191*   < >  --    < > 449* 3,297* 6,368*  --   --    < > = values in this interval not displayed.    Estimated Creatinine Clearance: 4.6 mL/min (A) (by C-G formula based on SCr of 8.12 mg/dL (H)).   Medications:  Scheduled:   aspirin EC  81 mg Oral Daily   clopidogrel  75 mg Oral Daily   hydrALAZINE  25 mg Oral Q8H   insulin aspart  0-6 Units Subcutaneous Q4H   metoprolol tartrate  25 mg Oral Q6H   pantoprazole (PROTONIX) IV  40 mg Intravenous Q24H   Infusions:   heparin Stopped (01/17/22 1249)   nitroGLYCERIN Stopped (01/17/22 1249)    Assessment: 87 yo F presenting with chest pain, significant bump on troponin. Patient was not on anticoagulation PTA. Pharmacy consulted to start IV heparin for ACS.   Heparin level today is supratherapeutic  at 0.92, on 750 units/hr. Per RN and phlebotomy, heparin level was drawn from left arm (same as heparin IV line). Hgb 9.0, plt 192. No line issues or signs/symptoms of bleeding noted per RN.  Ordered another heparin level to be re-drawn from right arm (opposite to heparin IV line), and heparin level supratherapeutic at 0.88 - drawn appropriately per phlebotomy.   Goal of Therapy:  Heparin level 0.3-0.7 units/ml Monitor platelets by anticoagulation protocol: Yes   Plan:  Decrease heparin gtt to 600 units/hr. Check ~8 hr heparin level.  Daily CBC, heparin level. Monitor for signs/symptoms of bleeding.   Vance Peper, PharmD PGY1 Pharmacy Resident Phone [phone] 01/17/2022 1:30 PM   Please check AMION for all [place] phone numbers After 10:00 PM, call [place] [phone]

## 2022-01-17 NOTE — Progress Notes (Signed)
Pt with complaint of abdominal pain. IV 12.5mg  of fentanyl was given iv push. Will continue to monitor pt.

## 2022-01-17 NOTE — Progress Notes (Signed)
Secure chat to Dr. Jonnie Finner and Dr. Horris Latino regarding request for ML.  Patient with CrCl 5.  Dr. Jonnie Finner states that he will seeing the patient and is aware that IV team is waiting on his okay to place ML.  Bedside nurse Keane Police, RN added to secure chat for status update.

## 2022-01-17 NOTE — Progress Notes (Signed)
Pt is complaining of nausea/vomiting and pain. Paged palliative provider. Pt ordered phenergan, Reglan, and Protonix. Spoke with the pharmacist who stated that the medications aren't all compatible with the medications that the client is receiving IV heparin and nitro. Paged provider back. Patient was given the iv reglan. Will continue to monitor pt.

## 2022-01-17 NOTE — Progress Notes (Signed)
Dr. Marlowe Sax paged about pt changed in cognition. Pt is only oriented to person. Pt did receive Dilaudid 0.5 mg IVP around 0140. Pt is calm and pleasant at this time. Pt is no longer having any chest pain.   Grandson is at bedside and pt is slowly able to be reoriented to place, time, and situation. Pt is slowly improving. Will continue to monitor pt very closely.

## 2022-01-17 NOTE — Progress Notes (Signed)
Pt with complaint of abdominal pain and nausea. Notified the provider and palliative team. PT refusing pain medication at this time. Went back to check on pt and she states that she no longer has any pain or nausea. Pt requesting to be left alone at this time. Called and updated the son. Report was given to the night shift nurse.

## 2022-01-17 NOTE — Consult Note (Addendum)
Consultation Note Date: 01/17/2022   Patient Name: Monique Cabrera  DOB: Jan 04, 1935  MRN: [id number]  Age / Sex: 87 y.o., female  PCP: Wardell Honour, MD Referring Physician: Alma Friendly, MD  Reason for Consultation: Establishing goals of care  HPI/Patient Profile: 87 y.o. female  with past medical history of diastolic CHF, CKD (stage V), type 2 diabetes, HTN, and status post right BKA admitted on 01/15/2022 with hypertensive crisis, chest pain, shortness of breath with associated nausea and vomiting.  On admission patient found to have bilateral pleural effusions.   Clinical Assessment and Goals of Care: I have reviewed medical records including EPIC notes, labs and imaging, assessed the patient and then met with patient and her grandson Truddie Crumble at bedside to discuss diagnosis prognosis, [place], EOL wishes, disposition and options.  I introduced Palliative Medicine as specialized medical care for people living with serious illness. It focuses on providing relief from the symptoms and stress of a serious illness. The goal is to improve quality of life for both the patient and the family.  We did not have a fruitful discussion as patient was wanting to eat her breakfast before he got cold.  I scheduled to return to her room this afternoon to continue discussions.  I rounded on the patient in the afternoon and found that she was nauseous and had scant vomiting X1 in the last hour.  As per RN, IV access was lost approximately one hour ago and IV team is now at bedside attempting to get IV line placed via ultrasound. Secure chat message sent to IV team requesting placement of subQ line.   Patient's daughter Ann Held present at bedside.  She lives with the patient's but is not highly involved with the patient's medical decisions.  She acknowledges that [name] and her older brothers and sisters are in contact with  the doctors and she likes it that way.  As far as functional and nutritional status daughter shares that patient was independent with ADLs.  Patient was no longer cooking with the stove or oven but was able to prepare her own meals, wash/bathe herself, and ambulate independently PTA.  Given Danae Chen did not want to speak about the patient's medical and of care.  I spoke with patient's son [name] over the phone. I attempted to elicit values and goals of care important to the patient.  [name] shares that he does not want her to be in pain and wants to be kept comfortable.  However, he is not prepared to move towards a full comfort path without the input of the rest of his siblings.  [name] would like to speak with the medical team as well as all of his siblings via telephone tomorrow at 2 PM. Dr. Jonnie Finner and Dr. Horris Latino made aware.  Family meeting set for Monday, 1/23, at 2pm.   Education offered regarding concept specific to human mortality and the limitations of medical interventions to prolong life versus acknolwedging end of life and treating the person's symptoms.  with family the importance of continued conversation with family and the medical providers regarding overall plan of care and treatment options, ensuring decisions are within the context of the patient’s values and GOCs.   ° °Questions and concerns were addressed. The family was encouraged to call with questions or concerns.  ° °Primary Decision Maker °PATIENT ° °Code Status/Advance Care Planning: °DNR °Reglan 5mg TID (started at 50% of recommended dose d/t renal impairment) °Fentanyl 12.5mcg IV Q2H PRN for pain °Phenergan 12.5mg IV PRN for N/V ° °Prognosis:   °Unable to determine ° °Discharge Planning: To Be Determined ° °Primary Diagnoses: °Present on Admission: ° Status post below-knee amputation of right lower extremity (HCC) ° Chronic kidney disease (CKD), stage V (HCC) ° Anemia of chronic renal failure ° Type 2 diabetes mellitus  with renal complication (HCC) ° ° °Physical Exam °Vitals and nursing note reviewed.  °Constitutional:   °   Appearance: She is well-developed. She is not ill-appearing or toxic-appearing.  °HENT:  °   Head: Normocephalic and atraumatic.  °Cardiovascular:  °   Rate and Rhythm: Normal rate.  °Pulmonary:  °   Effort: Pulmonary effort is normal.  °Abdominal:  °   Palpations: Abdomen is soft.  °Musculoskeletal:     °   General: Normal range of motion.  °Skin: °   General: Skin is warm and dry.  °Neurological:  °   Mental Status: She is alert and oriented to person, place, and time.  °Psychiatric:     °   Mood and Affect: Mood normal. Mood is not anxious.     °   Behavior: Behavior normal. Behavior is not agitated.  ° ° °Palliative Assessment/Data: 60% ° ° ° ° °I discussed this patient's plan of care with patient, nursing, IV team, Dr. Scherz, Dr. Ezenduka, patient's daughter and son. ° °Thank you for this consult. Palliative medicine will continue to follow and assist holistically.  ° °Time Total: 80 minutes °Greater than 50%  of this time was spent counseling and coordinating care related to the above assessment and plan. ° °Signed by: ° , DNP, FNP-BC °Palliative Medicine ° °  °Please contact Palliative Medicine Team phone at [phone] for questions and concerns.  °For individual provider: See Amion ° ° ° ° ° ° ° ° ° ° ° ° °  °

## 2022-01-17 NOTE — Progress Notes (Signed)
°  Echocardiogram 2D Echocardiogram has been performed.  Monique Cabrera F 01/17/2022, 9:04 AM

## 2022-01-17 NOTE — Progress Notes (Signed)
Progress noted pt refusing to leave O2 in place also offered to place a mask on pt. Pt stated did not want anything on her face.

## 2022-01-17 NOTE — Progress Notes (Addendum)
Midline consult: Arrived to unit to place second PIV for incompatible meds. (Midline not appropriate for renal patient.) RN in room, stated pt removed first site. 2 PIVs placed with [place]. 2033: Just noted in nephrology note pt has been declining dialysis.

## 2022-01-17 NOTE — Progress Notes (Signed)
0109-Received call back from Dr. Marlowe Sax to give pt Dilaudid 0.5 mg IVP for chest pain.   0137-Pt given medication and tolerated well. Will continue to monitor pt closely. Pt's Nitro gtt now at 110 mcg/min.

## 2022-01-17 NOTE — Social Work (Signed)
°  Transition of Care [facility]) Screening Note   Patient Details  Name: Monique Cabrera Date of Birth: [birth year]   Transition of Care [facility]) CM/SW Contact:    Bary Castilla, LCSW Phone Number: [phone] 01/17/2022, 2:00 PM    Transition of Care Department [facility]) has reviewed patient and no TOC needs have been identified at this time. We will continue to monitor patient advancement through interdisciplinary progression rounds. If new patient transition needs arise, please place a TOC consult.

## 2022-01-17 NOTE — Progress Notes (Signed)
PROGRESS NOTE  Monique Cabrera VVK:[id number] DOB: 1935/11/10 DOA: 01/15/2022 PCP: Wardell Honour, MD  HPI/Recap of past 24 hours: 87 yo female with hx of diastolic CHF, CKD stage 5(followed by [facility]), type 2 DM, HTN, s/p right BKA, presents to ER with CP and worsening SOB X 1 day. Patient states that she has had shortness of breath for well over 3 months, which has been progressively worsening. Reported some chest pain, when she was short of breath. She denies any lower extremity edema. In the ER, pt noted to hypertensive with SBP >200. Pt with difficult IV access. Took several hours to establish PIV. Started on IV ntg gtts. Labs showed troponin 191 --> 244. Cardiology consulted. Scr 7.5 BUN 51. Bicarb 18, BNP >4500. Due to malignant HTN, elevated troponin, CXR consistent with CHF, [facility] consulted for admission.       Saw patient this a.m., met her enjoying her breakfast, denied any nausea and vomiting, chest pain, or worsening shortness of breath.  Later this p.m., patient now having nausea/vomiting.  Discussed with patient and some family members at bedside.  Palliative consulted, plan for family meeting on 01/18/2022.   Assessment/Plan: Principal Problem:   Malignant hypertension Active Problems:   Type 2 diabetes mellitus with renal complication (HCC)   Status post below-knee amputation of right lower extremity (HCC)   Chronic kidney disease (CKD), stage V (HCC)   Anemia of chronic renal failure   Elevated troponin   Pleural effusion, bilateral   Acute on chronic diastolic CHF (congestive heart failure) (HCC)   DNR (do not resuscitate)/DNI(Do Not Intubate)    Hypertensive crisis Presented with SBP in the 200s, slowly improving within target Chest x-ray with interstitial edema, bilateral pleural effusion Echo showed EF of 40 to 45%, no regional wall motion abnormality Cardiology on board, continue nitroglycerin drip and titrate pending BP, recommend  adding Coreg, hydralazine once able to tolerate Holding home amlodipine, clonidine, Benicar for now Monitor closely and progressive unit  Chest pain Elevated troponins NSTEMI Troponins currently uptrending with a peak of 6368 LDL 83 EKG with no acute ST changes Echo with reduced EF as above Cardiology consulted, may consider cath if patient would undergo dialysis given advanced renal failure, recommend continuing nitro drip, start aspirin, heparin drip, Lipitor and Plavix once able to tolerate Telemetry, monitor closely  Acute systolic HF BNP >0,051 (CKD stage V) Chest x-ray with interstitial edema, bilateral pleural effusion Echo showed EF of 40 to 45%, no regional wall motion abnormality Unable to diurese due to CKD stage V (pending nephrology recommendation) Hold home torsemide for now Monitor closely  Bilateral pleural effusion Currently on room air If worsening SOB, may need thoracentesis  CKD stage V Possibly uremic symptoms/ACS with nausea/vomiting Has been refusing hemodialysis, still making urine Noted acidosis Nephrology consulted, appreciate recs Hold home calcitriol, sodium bicarb until able to tolerate orally  Intractable nausea/vomiting Likely 2/2 uremic symptoms, in addition to ACS Noted brownish vomitus KUB showed nonobstructive gas pattern Antiemetics, supportive care  Anemia of chronic kidney disease Baseline around 9 Anemia panel done on 12/2021 showed iron 67, sats 27%, ferritin 733 Daily CBC  Diabetes mellitus type 2 Last A1c on 11/2021-->5.9 SSI, Accu-Cheks, hypoglycemic protocol Hold home Lantus, lispro  S/p BKA of right lower extremity Hx of PVD Chronic Restart home Plavix once able    Estimated body mass index is 22.38 kg/m as calculated from the following:   Height as of this encounter: 5' 5" (1.651  m).   Weight as of this encounter: 61 kg.     Code Status: DNR  Family Communication: Multiple family members at bedside, patient  okay to discuss medical issues in front of family members  Disposition Plan: Status is: Inpatient  Remains inpatient appropriate because: Level of care    Consultants: Cardiology Nephrology Palliative/hospice  Procedures: None  Antimicrobials: None  DVT prophylaxis: Heparin drip   Objective: Vitals:   01/17/22 0608 01/17/22 0757 01/17/22 1007 01/17/22 1200  BP: 132/75 (!) 165/87 (!) 161/93 (!) 159/87  Pulse: 80 92 95 89  Resp:  20  20  Temp:  (!) 97.5 F (36.4 C)  97.8 F (36.6 C)  TempSrc:  Axillary  Oral  SpO2: 95% 98%  98%  Weight:      Height:        Intake/Output Summary (Last 24 hours) at 01/17/2022 1613 Last data filed at 01/17/2022 1015 Gross per 24 hour  Intake 855.82 ml  Output 515 ml  Net 340.82 ml   Filed Weights   01/15/22 1431 01/16/22 0120 01/17/22 0409  Weight: 64.9 kg 61.1 kg 61 kg    Exam: General: Acutely ill-appearing Cardiovascular: S1, S2 present Respiratory: Bibasilar crackles Abdomen: Soft, nontender, nondistended, bowel sounds present Musculoskeletal: No bilateral pedal edema noted Skin: Normal Psychiatry: Normal mood     Data Reviewed: CBC: Recent Labs  Lab 01/15/22 1631 01/16/22 0222 01/17/22 0249  WBC 8.7 7.7 7.9  NEUTROABS 6.5 5.6 5.6  HGB 11.0* 9.0* 9.0*  HCT 33.7* 28.3* 28.7*  MCV 98.3 96.3 97.6  PLT 229 210 150   Basic Metabolic Panel: Recent Labs  Lab 01/15/22 1631 01/16/22 0222 01/17/22 0249  NA 137 137 138  K 4.2 4.3 5.1  CL 104 106 106  CO2 18* 17* 15*  GLUCOSE 115* 103* 202*  BUN 51* 56* 67*  CREATININE 7.50* 7.59* 8.12*  CALCIUM 9.3 8.7* 9.1  MG  --  1.9  --   PHOS  --   --  9.8*   GFR: Estimated Creatinine Clearance: 4.6 mL/min (A) (by C-G formula based on SCr of 8.12 mg/dL (H)). Liver Function Tests: Recent Labs  Lab 01/15/22 1631 01/16/22 0222 01/17/22 0249  AST 23 19  --   ALT 17 11  --   ALKPHOS 53 47  --   BILITOT 1.0 0.7  --   PROT 6.5 5.4*  --   ALBUMIN 3.0* 2.6* 2.7*    No results for input(s): LIPASE, AMYLASE in the last 168 hours. No results for input(s): AMMONIA in the last 168 hours. Coagulation Profile: No results for input(s): INR, PROTIME in the last 168 hours. Cardiac Enzymes: No results for input(s): CKTOTAL, CKMB, CKMBINDEX, TROPONINI in the last 168 hours. BNP (last 3 results) No results for input(s): PROBNP in the last 8760 hours. HbA1C: No results for input(s): HGBA1C in the last 72 hours. CBG: Recent Labs  Lab 01/16/22 2207 01/16/22 2329 01/17/22 0405 01/17/22 0745 01/17/22 1121  GLUCAP 181* 205* 192* 169* 190*   Lipid Profile: Recent Labs    01/17/22 0249  CHOL 160  HDL 61  LDLCALC 83  TRIG 82  CHOLHDL 2.6   Thyroid Function Tests: No results for input(s): TSH, T4TOTAL, FREET4, T3FREE, THYROIDAB in the last 72 hours. Anemia Panel: No results for input(s): VITAMINB12, FOLATE, FERRITIN, TIBC, IRON, RETICCTPCT in the last 72 hours. Urine analysis:    Component Value Date/Time   COLORURINE YELLOW 05/21/2020 0805   APPEARANCEUR CLEAR 05/21/2020 0805  LABSPEC 1.012 05/21/2020 0805   PHURINE 6.0 05/21/2020 0805   GLUCOSEU 150 (A) 05/21/2020 0805   HGBUR NEGATIVE 05/21/2020 0805   BILIRUBINUR NEGATIVE 05/21/2020 0805   KETONESUR NEGATIVE 05/21/2020 0805   PROTEINUR 100 (A) 05/21/2020 0805   UROBILINOGEN 0.2 07/30/2014 1945   NITRITE NEGATIVE 05/21/2020 0805   LEUKOCYTESUR NEGATIVE 05/21/2020 0805   Sepsis Labs: _0 (procalcitonin:4,lacticidven:4)  ) Recent Results (from the past 240 hour(s))  Resp Panel by RT-PCR (Flu A&B, Covid) Nasopharyngeal Swab     Status: None   Collection Time: 01/15/22  1:36 PM   Specimen: Nasopharyngeal Swab; Nasopharyngeal(NP) swabs in vial transport medium  Result Value Ref Range Status   SARS Coronavirus 2 by RT PCR NEGATIVE NEGATIVE Final    Comment: (NOTE) SARS-CoV-2 target nucleic acids are NOT DETECTED.  The SARS-CoV-2 RNA is generally detectable in upper  respiratory specimens during the acute phase of infection. The lowest concentration of SARS-CoV-2 viral copies this assay can detect is 138 copies/mL. A negative result does not preclude SARS-Cov-2 infection and should not be used as the sole basis for treatment or other patient management decisions. A negative result may occur with  improper specimen collection/handling, submission of specimen other than nasopharyngeal swab, presence of viral mutation(s) within the areas targeted by this assay, and inadequate number of viral copies(<138 copies/mL). A negative result must be combined with clinical observations, patient history, and epidemiological information. The expected result is Negative.  Fact Sheet for Patients:  EntrepreneurPulse.com.au  Fact Sheet for Healthcare Providers:  IncredibleEmployment.be  This test is no t yet approved or cleared by the [place] FDA and  has been authorized for detection and/or diagnosis of SARS-CoV-2 by FDA under an Emergency Use Authorization (EUA). This EUA will remain  in effect (meaning this test can be used) for the duration of the COVID-19 declaration under Section 564(b)(1) of the Act, 21 U.S.C.section 360bbb-3(b)(1), unless the authorization is terminated  or revoked sooner.       Influenza A by PCR NEGATIVE NEGATIVE Final   Influenza B by PCR NEGATIVE NEGATIVE Final    Comment: (NOTE) The Xpert Xpress SARS-CoV-2/FLU/RSV plus assay is intended as an aid in the diagnosis of influenza from Nasopharyngeal swab specimens and should not be used as a sole basis for treatment. Nasal washings and aspirates are unacceptable for Xpert Xpress SARS-CoV-2/FLU/RSV testing.  Fact Sheet for Patients: EntrepreneurPulse.com.au  Fact Sheet for Healthcare Providers: IncredibleEmployment.be  This test is not yet approved or cleared by the [place] FDA and has been  authorized for detection and/or diagnosis of SARS-CoV-2 by FDA under an Emergency Use Authorization (EUA). This EUA will remain in effect (meaning this test can be used) for the duration of the COVID-19 declaration under Section 564(b)(1) of the Act, 21 U.S.C. section 360bbb-3(b)(1), unless the authorization is terminated or revoked.  Performed at [facility], [place] [address] [place], [place] [address]       Studies: ECHOCARDIOGRAM COMPLETE  Result Date: 01/17/2022    ECHOCARDIOGRAM REPORT   Patient Name:   Monique Cabrera Date of Exam: 01/17/2022 Medical Rec #:  [id number]        Height:       65.0 in Accession #:    [id number]       Weight:       134.7 lb Date of Birth:  [birth year]        BSA:          1.672 m Patient Age:  86 years         BP:           143/114 mmHg Patient Gender: F                HR:           97 bpm. Exam Location:  Inpatient Procedure: 2D Echo, Cardiac Doppler, Color Doppler and 3D Echo Indications:    CHF-Acute Diastolic Q33.00  History:        Patient has prior history of Echocardiogram examinations, most                 recent 02/09/2020. Stroke and PAD, Signs/Symptoms:Edema and                 Altered Mental Status; Risk Factors:Diabetes, Hypertension and                 Dyslipidemia.  Sonographer:    Merrie Roof RDCS Referring Phys: Mil[place] Comments: Image acquisition challenging due to uncooperative patient. The patient woke up and sat straight up and would not lay back down to finish the echo protocol. IMPRESSIONS  1. Septal and apical hypokinesis EF has decreased since echo done 02/09/20 . Left ventricular ejection fraction, by estimation, is 40 to 45%. The left ventricle has mildly decreased function. The left ventricle has no regional wall motion abnormalities. The left ventricular internal cavity size was mildly dilated. There is mild asymmetric left ventricular hypertrophy of the basal and septal segments. Left ventricular diastolic  parameters were normal.  2. Right ventricular systolic function is normal. The right ventricular size is normal.  3. Left atrial size was mildly dilated.  4. The mitral valve is abnormal. Trivial mitral valve regurgitation. No evidence of mitral stenosis.  5. The aortic valve is tricuspid. There is moderate calcification of the aortic valve. There is moderate thickening of the aortic valve. Aortic valve regurgitation is trivial. Aortic valve sclerosis/calcification is present, without any evidence of aortic stenosis.  6. The inferior vena cava is normal in size with greater than 50% respiratory variability, suggesting right atrial pressure of 3 mmHg. FINDINGS  Left Ventricle: Septal and apical hypokinesis EF has decreased since echo done 02/09/20. Left ventricular ejection fraction, by estimation, is 40 to 45%. The left ventricle has mildly decreased function. The left ventricle has no regional wall motion abnormalities. The left ventricular internal cavity size was mildly dilated. There is mild asymmetric left ventricular hypertrophy of the basal and septal segments. Left ventricular diastolic parameters were normal. Right Ventricle: The right ventricular size is normal. No increase in right ventricular wall thickness. Right ventricular systolic function is normal. Left Atrium: Left atrial size was mildly dilated. Right Atrium: Right atrial size was normal in size. Pericardium: There is no evidence of pericardial effusion. Mitral Valve: The mitral valve is abnormal. There is mild thickening of the mitral valve leaflet(s). There is mild calcification of the mitral valve leaflet(s). Trivial mitral valve regurgitation. No evidence of mitral valve stenosis. Tricuspid Valve: The tricuspid valve is normal in structure. Tricuspid valve regurgitation is mild . No evidence of tricuspid stenosis. Aortic Valve: The aortic valve is tricuspid. There is moderate calcification of the aortic valve. There is moderate thickening of  the aortic valve. Aortic valve regurgitation is trivial. Aortic valve sclerosis/calcification is present, without any evidence of aortic stenosis. Pulmonic Valve: The pulmonic valve was normal in structure. Pulmonic valve regurgitation is mild. No evidence of pulmonic stenosis. Aorta: The aortic root  is normal in size and structure. Venous: The inferior vena cava is normal in size with greater than 50% respiratory variability, suggesting right atrial pressure of 3 mmHg. IAS/Shunts: No atrial level shunt detected by color flow Doppler.  LEFT VENTRICLE PLAX 2D LVIDd:         3.70 cm LVIDs:         2.90 cm LV PW:         1.30 cm LV IVS:        1.20 cm LVOT diam:     1.80 cm     3D Volume EF: LVOT Area:     2.54 cm    3D EF:        43 %                            LV EDV:       134 ml                            LV ESV:       76 ml LV Volumes (MOD)           LV SV:        58 ml LV vol d, MOD A4C: 91.7 ml LV vol s, MOD A4C: 56.5 ml LV SV MOD A4C:     91.7 ml LEFT ATRIUM         Index LA diam:    4.10 cm 2.45 cm/m   AORTA Ao Root diam: 3.20 cm Ao Asc diam:  2.90 cm  SHUNTS Systemic Diam: 1.80 cm Jenkins Rouge MD Electronically signed by Jenkins Rouge MD Signature Date/Time: 01/17/2022/11:57:43 AM    Final     Scheduled Meds:  aspirin EC  81 mg Oral Daily   clopidogrel  75 mg Oral Daily   hydrALAZINE  25 mg Oral Q8H   insulin aspart  0-6 Units Subcutaneous Q4H   metoCLOPramide (REGLAN) injection  5 mg Intravenous TID WC & HS   metoprolol tartrate  25 mg Oral Q6H   pantoprazole (PROTONIX) IV  40 mg Intravenous Q24H    Continuous Infusions:  heparin 600 Units/hr (01/17/22 1355)   nitroGLYCERIN 35 mcg/min (01/17/22 1400)   promethazine (PHENERGAN) injection (IM or IVPB)       LOS: 2 days     Alma Friendly, MD [facility]  If 7PM-7AM, please contact night-coverage [email or web address] 01/17/2022, 4:13 PM

## 2022-01-18 ENCOUNTER — Other Ambulatory Visit: Payer: Self-pay | Admitting: *Deleted

## 2022-01-18 DIAGNOSIS — N185 Chronic kidney disease, stage 5: Secondary | ICD-10-CM | POA: Diagnosis not present

## 2022-01-18 DIAGNOSIS — I5033 Acute on chronic diastolic (congestive) heart failure: Secondary | ICD-10-CM | POA: Diagnosis not present

## 2022-01-18 DIAGNOSIS — R778 Other specified abnormalities of plasma proteins: Secondary | ICD-10-CM | POA: Diagnosis not present

## 2022-01-18 DIAGNOSIS — I10 Essential (primary) hypertension: Secondary | ICD-10-CM | POA: Diagnosis not present

## 2022-01-18 LAB — HEPARIN LEVEL (UNFRACTIONATED): Heparin Unfractionated: 0.49 IU/mL (ref 0.30–0.70)

## 2022-01-18 LAB — RENAL FUNCTION PANEL
Albumin: 2.8 g/dL — ABNORMAL LOW (ref 3.5–5.0)
Anion gap: 14 (ref 5–15)
BUN: 78 mg/dL — ABNORMAL HIGH (ref 8–23)
CO2: 16 mmol/L — ABNORMAL LOW (ref 22–32)
Calcium: 8.9 mg/dL (ref 8.9–10.3)
Chloride: 105 mmol/L (ref 98–111)
Creatinine, Ser: 8.45 mg/dL — ABNORMAL HIGH (ref 0.44–1.00)
GFR, Estimated: 4 mL/min — ABNORMAL LOW (ref >60)
Glucose, Bld: 164 mg/dL — ABNORMAL HIGH (ref 70–99)
Phosphorus: 9 mg/dL — ABNORMAL HIGH (ref 2.5–4.6)
Potassium: 4.7 mmol/L (ref 3.5–5.1)
Sodium: 135 mmol/L (ref 135–145)

## 2022-01-18 LAB — CBC WITH DIFFERENTIAL/PLATELET
Abs Immature Granulocytes: 0.09 10*3/uL — ABNORMAL HIGH (ref 0.00–0.07)
Basophils Absolute: 0 10*3/uL (ref 0.0–0.1)
Basophils Relative: 0 %
Eosinophils Absolute: 0 10*3/uL (ref 0.0–0.5)
Eosinophils Relative: 0 %
HCT: 26.5 % — ABNORMAL LOW (ref 36.0–46.0)
Hemoglobin: 8.7 g/dL — ABNORMAL LOW (ref 12.0–15.0)
Immature Granulocytes: 1 %
Lymphocytes Relative: 11 %
Lymphs Abs: 1.2 10*3/uL (ref 0.7–4.0)
MCH: 31.3 pg (ref 26.0–34.0)
MCHC: 32.8 g/dL (ref 30.0–36.0)
MCV: 95.3 fL (ref 80.0–100.0)
Monocytes Absolute: 1 10*3/uL (ref 0.1–1.0)
Monocytes Relative: 9 %
Neutro Abs: 9 10*3/uL — ABNORMAL HIGH (ref 1.7–7.7)
Neutrophils Relative %: 79 %
Platelets: 204 10*3/uL (ref 150–400)
RBC: 2.78 MIL/uL — ABNORMAL LOW (ref 3.87–5.11)
RDW: 13.9 % (ref 11.5–15.5)
WBC: 11.3 10*3/uL — ABNORMAL HIGH (ref 4.0–10.5)
nRBC: 0 % (ref 0.0–0.2)

## 2022-01-18 LAB — GLUCOSE, CAPILLARY
Glucose-Capillary: 141 mg/dL — ABNORMAL HIGH (ref 70–99)
Glucose-Capillary: 144 mg/dL — ABNORMAL HIGH (ref 70–99)
Glucose-Capillary: 157 mg/dL — ABNORMAL HIGH (ref 70–99)
Glucose-Capillary: 159 mg/dL — ABNORMAL HIGH (ref 70–99)
Glucose-Capillary: 162 mg/dL — ABNORMAL HIGH (ref 70–99)
Glucose-Capillary: 163 mg/dL — ABNORMAL HIGH (ref 70–99)
Glucose-Capillary: 165 mg/dL — ABNORMAL HIGH (ref 70–99)

## 2022-01-18 LAB — HEMOGLOBIN A1C
Hgb A1c MFr Bld: 5.7 % — ABNORMAL HIGH (ref 4.8–5.6)
Mean Plasma Glucose: 117 mg/dL

## 2022-01-18 MED ORDER — CEFAZOLIN SODIUM-DEXTROSE 2-4 GM/100ML-% IV SOLN: 2.0000 g | INTRAVENOUS | Status: AC

## 2022-01-18 MED ORDER — SODIUM CHLORIDE 0.9% FLUSH
3.0000 mL | Freq: Two times a day (BID) | INTRAVENOUS | Status: DC
  Administered 2022-01-18 – 2022-01-28 (×16): 3 mL via INTRAVENOUS

## 2022-01-18 MED ORDER — SODIUM CHLORIDE 0.9 % IV SOLN: INTRAVENOUS | Status: DC

## 2022-01-18 MED ORDER — ASPIRIN 81 MG PO CHEW
81.0000 mg | CHEWABLE_TABLET | ORAL | Status: AC
  Administered 2022-01-19: 06:00:00 81 mg via ORAL
  Filled 2022-01-18: qty 1

## 2022-01-18 MED ORDER — SODIUM CHLORIDE 0.9% FLUSH: 3.0000 mL | INTRAVENOUS | Status: DC | PRN

## 2022-01-18 MED ORDER — SODIUM CHLORIDE 0.9 % IV SOLN: 250.0000 mL | INTRAVENOUS | Status: DC | PRN

## 2022-01-18 NOTE — Progress Notes (Signed)
ANTICOAGULATION CONSULT NOTE - Follow Up Consult  Pharmacy Consult for Heparin Indication: chest pain/ACS  Allergies  Allergen Reactions   Invokana [Canagliflozin] Itching, Swelling and Other (See Comments)    Vaginal itching, swelling and irritation   Jardiance [Empagliflozin] Itching, Swelling and Other (See Comments)    Vaginal itching and swelling   Tuberculin Ppd Other (See Comments)    Per records from [facility]     Patient Measurements: Height: 5\' 5"  (165.1 cm) Weight: 61 kg (134 lb 7.7 oz) IBW/kg (Calculated) : 57 Heparin Dosing Weight: 64.9 kg  Vital Signs: Temp: 97.3 F (36.3 C) (01/22 1958) Temp Source: Oral (01/22 1958) BP: 173/97 (01/22 1958) Pulse Rate: 95 (01/22 1958)  Labs: Recent Labs    01/15/22 1631 01/15/22 1818 01/16/22 0222 01/16/22 0457 01/16/22 0700 01/16/22 2148 01/17/22 0249 01/17/22 1105 01/17/22 1200 01/17/22 2251  HGB 11.0*  --  9.0*  --   --   --  9.0*  --   --   --   HCT 33.7*  --  28.3*  --   --   --  28.7*  --   --   --   PLT 229  --  210  --   --   --  192  --   --   --   HEPARINUNFRC  --   --   --   --   --   --   --  0.92* 0.88* 0.27*  CREATININE 7.50*  --  7.59*  --   --   --  8.12*  --   --   --   TROPONINIHS 191*   < >  --    < > 449* 3,297* 6,368*  --   --   --    < > = values in this interval not displayed.     Estimated Creatinine Clearance: 4.6 mL/min (A) (by C-G formula based on SCr of 8.12 mg/dL (H)).   Medications:  Scheduled:   aspirin EC  81 mg Oral Daily   clopidogrel  75 mg Oral Daily   hydrALAZINE  25 mg Oral Q8H   insulin aspart  0-6 Units Subcutaneous Q4H   metoCLOPramide (REGLAN) injection  5 mg Intravenous TID WC & HS   metoprolol tartrate  25 mg Oral Q6H   pantoprazole (PROTONIX) IV  40 mg Intravenous Q24H   Infusions:   heparin 600 Units/hr (01/17/22 2053)   nitroGLYCERIN 35 mcg/min (01/17/22 2052)    Assessment: 87 yo F presenting with chest pain, significant bump on troponin. Patient  was not on anticoagulation PTA. Pharmacy consulted to start IV heparin for ACS.   Heparin level this AM is slightly low on 600 units/hr. No s/s of overt bleeding noted per RN     Goal of Therapy:  Heparin level 0.3-0.7 units/ml Monitor platelets by anticoagulation protocol: Yes   Plan:  Increase heparin gtt slightly to 650 units/hr. Check ~8 hr heparin level.  Daily CBC, heparin level. Monitor for signs/symptoms of bleeding.   Albertina Parr, PharmD., BCPS, BCCCP Clinical Pharmacist Please refer to [facility] for unit-specific pharmacist

## 2022-01-18 NOTE — Progress Notes (Signed)
After palliative/family meeting today, patient stated she would like to be full code during the cath procedure. After talking with the family, they expressed the patient was wanting to be full code for her admission, not just during the procedure. I confirmed with Monique Cabrera her wishes, and she stated that she would like to be full code from now until she discharges. MD Horris Latino made aware of patient's decision.

## 2022-01-18 NOTE — Progress Notes (Addendum)
PROGRESS NOTE  Monique Cabrera HYW:[id number] DOB: 1935/05/03 DOA: 01/15/2022 PCP: Wardell Honour, MD  HPI/Recap of past 24 hours: 87 yo female with hx of diastolic CHF, CKD stage 5(followed by [facility]), type 2 DM, HTN, s/p right BKA, presents to ER with CP and worsening SOB X 1 day. Patient states that she has had shortness of breath for well over 3 months, which has been progressively worsening. Reported some chest pain, when she was short of breath. She denies any lower extremity edema. In the ER, pt noted to hypertensive with SBP >200. Pt with difficult IV access. Took several hours to establish PIV. Started on IV ntg gtts. Labs showed troponin 191 --> 244. Cardiology consulted. Scr 7.5 BUN 51. Bicarb 18, BNP >4500. Due to malignant HTN, elevated troponin, CXR consistent with CHF, [facility] consulted for admission.       Today, saw pt this am, reporting L sided chest pain, appeared uncomfortable. Was able to tolerate her breakfast earlier this am, without N/V.   Assessment/Plan: Principal Problem:   Malignant hypertension Active Problems:   Type 2 diabetes mellitus with renal complication (HCC)   Status post below-knee amputation of right lower extremity (HCC)   Chronic kidney disease (CKD), stage V (HCC)   Anemia of chronic renal failure   Elevated troponin   Pleural effusion, bilateral   Acute on chronic diastolic CHF (congestive heart failure) (HCC)   DNR (do not resuscitate)/DNI(Do Not Intubate)    Hypertensive crisis Presented with SBP in the 200s, slowly improving within target Chest x-ray with interstitial edema, bilateral pleural effusion Echo showed EF of 40 to 45%, no regional wall motion abnormality Cardiology on board, continue nitroglycerin drip and titrate pending BP, recommend adding Coreg, hydralazine once able to tolerate Holding home amlodipine, clonidine, Benicar for now Monitor closely and progressive unit  Chest pain Elevated  troponins NSTEMI Troponins currently uptrending with a peak of 6368 LDL 83 EKG with no acute ST changes Echo with reduced EF as above Cardiology consulted, may consider cath if patient would undergo dialysis given advanced renal failure, recommend continuing nitro drip, start aspirin, heparin drip, Lipitor and Plavix once able to tolerate Telemetry, monitor closely  Acute systolic HF BNP >4,854 (CKD stage V) Chest x-ray with interstitial edema, bilateral pleural effusion Echo showed EF of 40 to 45%, no regional wall motion abnormality Unable to diurese due to CKD stage V, although does not appear to be significantly overloaded Hold home torsemide for now Monitor closely  Bilateral pleural effusion Currently on room air If worsening SOB, may need thoracentesis  CKD stage V Possibly uremic symptoms/ACS with nausea/vomiting Has been refusing hemodialysis, still making urine Noted acidosis Nephrology consulted, appreciate recs Hold home calcitriol, sodium bicarb until able to tolerate orally  Intractable nausea/vomiting Likely 2/2 uremic symptoms, in addition to ACS Lipase 23, LFTs WNL KUB showed nonobstructive gas pattern CT abdomen/pelvis showed no acute intra-abdominal etiology, cholelithiasis Antiemetics, supportive care  Anemia of chronic kidney disease Baseline around 9 Anemia panel done on 12/2021 showed iron 67, sats 27%, ferritin 733 Daily CBC  Diabetes mellitus type 2 Last A1c on 11/2021-->5.9 SSI, Accu-Cheks, hypoglycemic protocol Hold home Lantus, lispro  S/p BKA of right lower extremity Hx of PVD Chronic Restart home Plavix once able  GOC discussion Pt with very poor prognosis, DNR, poor cath candidate due to advanced CKD, refusing HD Palliative care consulted, had an extensive family meeting with palliative, nephrology, patient and some family members, mostly her children.  Patient would like to proceed with cardiac cath as well as hemodialysis afterwards.   Detailed explanation including risks were discussed with both patient and family members about initiation of hemodialysis. They all verbalized understanding.  Patient would like to proceed.    Estimated body mass index is 22.27 kg/m as calculated from the following:   Height as of this encounter: 5\' 5"  (1.651 m).   Weight as of this encounter: 60.7 kg.     Code Status: DNR  Family Communication: None at bedside  Disposition Plan: Status is: Inpatient  Remains inpatient appropriate because: Level of care    Consultants: Cardiology Nephrology Palliative/hospice  Procedures: None  Antimicrobials: None  DVT prophylaxis: Heparin drip   Objective: Vitals:   01/18/22 0012 01/18/22 0446 01/18/22 0825 01/18/22 1238  BP: (!) 156/90 (!) 151/74 (!) 183/99 (!) 174/93  Pulse: 95 84 96 97  Resp:   16   Temp: (!) 97.4 F (36.3 C) (!) 97.4 F (36.3 C) (!) 97.5 F (36.4 C) 97.7 F (36.5 C)  TempSrc: Oral Oral Oral Oral  SpO2: 95% 99%    Weight:  60.7 kg    Height:       No intake or output data in the 24 hours ending 01/18/22 1305  Filed Weights   01/16/22 0120 01/17/22 0409 01/18/22 0446  Weight: 61.1 kg 61 kg 60.7 kg    Exam: General: Acutely ill-appearing, restless Cardiovascular: S1, S2 present Respiratory: Bibasilar crackles Abdomen: Soft, nontender, nondistended, bowel sounds present Musculoskeletal: No bilateral pedal edema noted Skin: Normal Psychiatry: Fair mood     Data Reviewed: CBC: Recent Labs  Lab 01/15/22 1631 01/16/22 0222 01/17/22 0249 01/18/22 0344  WBC 8.7 7.7 7.9 11.3*  NEUTROABS 6.5 5.6 5.6 9.0*  HGB 11.0* 9.0* 9.0* 8.7*  HCT 33.7* 28.3* 28.7* 26.5*  MCV 98.3 96.3 97.6 95.3  PLT 229 210 192 161   Basic Metabolic Panel: Recent Labs  Lab 01/15/22 1631 01/16/22 0222 01/17/22 0249 01/18/22 0344  NA 137 137 138 135  K 4.2 4.3 5.1 4.7  CL 104 106 106 105  CO2 18* 17* 15* 16*  GLUCOSE 115* 103* 202* 164*  BUN 51* 56* 67* 78*   CREATININE 7.50* 7.59* 8.12* 8.45*  CALCIUM 9.3 8.7* 9.1 8.9  MG  --  1.9  --   --   PHOS  --   --  9.8* 9.0*   GFR: Estimated Creatinine Clearance: 4.5 mL/min (A) (by C-G formula based on SCr of 8.45 mg/dL (H)). Liver Function Tests: Recent Labs  Lab 01/15/22 1631 01/16/22 0222 01/17/22 0249 01/18/22 0344  AST 23 19  --   --   ALT 17 11  --   --   ALKPHOS 53 47  --   --   BILITOT 1.0 0.7  --   --   PROT 6.5 5.4*  --   --   ALBUMIN 3.0* 2.6* 2.7* 2.8*   Recent Labs  Lab 01/17/22 1105  LIPASE 23   No results for input(s): AMMONIA in the last 168 hours. Coagulation Profile: No results for input(s): INR, PROTIME in the last 168 hours. Cardiac Enzymes: No results for input(s): CKTOTAL, CKMB, CKMBINDEX, TROPONINI in the last 168 hours. BNP (last 3 results) No results for input(s): PROBNP in the last 8760 hours. HbA1C: Recent Labs    01/15/22 2100  HGBA1C 5.7*   CBG: Recent Labs  Lab 01/17/22 2001 01/18/22 0011 01/18/22 0448 01/18/22 0824 01/18/22 1219  GLUCAP 178* 165* 157*  162* 163*   Lipid Profile: Recent Labs    01/17/22 0249  CHOL 160  HDL 61  LDLCALC 83  TRIG 82  CHOLHDL 2.6   Thyroid Function Tests: No results for input(s): TSH, T4TOTAL, FREET4, T3FREE, THYROIDAB in the last 72 hours. Anemia Panel: No results for input(s): VITAMINB12, FOLATE, FERRITIN, TIBC, IRON, RETICCTPCT in the last 72 hours. Urine analysis:    Component Value Date/Time   COLORURINE YELLOW 05/21/2020 0805   APPEARANCEUR CLEAR 05/21/2020 0805   LABSPEC 1.012 05/21/2020 0805   PHURINE 6.0 05/21/2020 0805   GLUCOSEU 150 (A) 05/21/2020 0805   HGBUR NEGATIVE 05/21/2020 0805   BILIRUBINUR NEGATIVE 05/21/2020 0805   KETONESUR NEGATIVE 05/21/2020 0805   PROTEINUR 100 (A) 05/21/2020 0805   UROBILINOGEN 0.2 07/30/2014 1945   NITRITE NEGATIVE 05/21/2020 0805   LEUKOCYTESUR NEGATIVE 05/21/2020 0805   Sepsis Labs: @LABRCNTIP (procalcitonin:4,lacticidven:4)  ) Recent Results  (from the past 240 hour(s))  Resp Panel by RT-PCR (Flu A&B, Covid) Nasopharyngeal Swab     Status: None   Collection Time: 01/15/22  1:36 PM   Specimen: Nasopharyngeal Swab; Nasopharyngeal(NP) swabs in vial transport medium  Result Value Ref Range Status   SARS Coronavirus 2 by RT PCR NEGATIVE NEGATIVE Final    Comment: (NOTE) SARS-CoV-2 target nucleic acids are NOT DETECTED.  The SARS-CoV-2 RNA is generally detectable in upper respiratory specimens during the acute phase of infection. The lowest concentration of SARS-CoV-2 viral copies this assay can detect is 138 copies/mL. A negative result does not preclude SARS-Cov-2 infection and should not be used as the sole basis for treatment or other patient management decisions. A negative result may occur with  improper specimen collection/handling, submission of specimen other than nasopharyngeal swab, presence of viral mutation(s) within the areas targeted by this assay, and inadequate number of viral copies(<138 copies/mL). A negative result must be combined with clinical observations, patient history, and epidemiological information. The expected result is Negative.  Fact Sheet for Patients:  EntrepreneurPulse.com.au  Fact Sheet for Healthcare Providers:  IncredibleEmployment.be  This test is no t yet approved or cleared by the [place] FDA and  has been authorized for detection and/or diagnosis of SARS-CoV-2 by FDA under an Emergency Use Authorization (EUA). This EUA will remain  in effect (meaning this test can be used) for the duration of the COVID-19 declaration under Section 564(b)(1) of the Act, 21 U.S.C.section 360bbb-3(b)(1), unless the authorization is terminated  or revoked sooner.       Influenza A by PCR NEGATIVE NEGATIVE Final   Influenza B by PCR NEGATIVE NEGATIVE Final    Comment: (NOTE) The Xpert Xpress SARS-CoV-2/FLU/RSV plus assay is intended as an aid in the  diagnosis of influenza from Nasopharyngeal swab specimens and should not be used as a sole basis for treatment. Nasal washings and aspirates are unacceptable for Xpert Xpress SARS-CoV-2/FLU/RSV testing.  Fact Sheet for Patients: EntrepreneurPulse.com.au  Fact Sheet for Healthcare Providers: IncredibleEmployment.be  This test is not yet approved or cleared by the [place] FDA and has been authorized for detection and/or diagnosis of SARS-CoV-2 by FDA under an Emergency Use Authorization (EUA). This EUA will remain in effect (meaning this test can be used) for the duration of the COVID-19 declaration under Section 564(b)(1) of the Act, 21 U.S.C. section 360bbb-3(b)(1), unless the authorization is terminated or revoked.  Performed at [facility], [place] [address] [place], [place] [address]       Studies: CT ABDOMEN PELVIS WO CONTRAST  Result  Date: 01/17/2022 CLINICAL DATA:  Nausea and vomiting. EXAM: CT ABDOMEN AND PELVIS WITHOUT CONTRAST TECHNIQUE: Multidetector CT imaging of the abdomen and pelvis was performed following the standard protocol without IV contrast. RADIATION DOSE REDUCTION: This exam was performed according to the departmental dose-optimization program which includes automated exposure control, adjustment of the mA and/or kV according to patient size and/or use of iterative reconstruction technique. COMPARISON:  CT abdomen pelvis dated 08/21/2012. FINDINGS: Evaluation of this exam is limited in the absence of intravenous contrast as well as respiratory motion. Lower chest: Partially visualized moderate to large bilateral pleural effusions with large areas of consolidative changes of the lower lobes. Advanced 3 vessel coronary vascular calcification. No intra-abdominal free air.  Small free fluid in the pelvis. Hepatobiliary: Small right liver hypodense lesions are not characterized on this noncontrast CT. No intrahepatic biliary  dilatation. There is layering sludge or small stones within the gallbladder. Pancreas: The pancreas is poorly visualized and suboptimally evaluated. Spleen: Normal in size without focal abnormality. Adrenals/Urinary Tract: The adrenal glands are grossly unremarkable. Moderate bilateral renal parenchyma atrophy. Indeterminate 2.5 cm hypodense lesion in the upper pole of the right kidney, likely a cyst. There is no hydronephrosis on either side. The urinary bladder is partially distended and grossly unremarkable. Stomach/Bowel: There is no bowel obstruction or active inflammation. No evidence of acute appendicitis. Vascular/Lymphatic: Advanced aortoiliac atherosclerotic disease as well as advanced mesenteric vascular calcification. The IVC is grossly unremarkable. No portal venous gas. There is no adenopathy. Reproductive: Hysterectomy. Other: There is diffuse subcutaneous edema and anasarca. Musculoskeletal: Osteopenia with degenerative changes of the spine. No acute osseous pathology. IMPRESSION: 1. No acute intra-abdominal or pelvic pathology. No bowel obstruction. 2. Cholelithiasis. 3. Partially visualized moderate to large bilateral pleural effusions with large areas of consolidative changes of the lower lobes. 4. Aortic Atherosclerosis (ICD10-I70.0). Electronically Signed   By: Anner Crete M.D.   On: 01/17/2022 22:14    Scheduled Meds:  aspirin EC  81 mg Oral Daily   clopidogrel  75 mg Oral Daily   hydrALAZINE  25 mg Oral Q8H   insulin aspart  0-6 Units Subcutaneous Q4H   metoCLOPramide (REGLAN) injection  5 mg Intravenous TID WC & HS   metoprolol tartrate  25 mg Oral Q6H   pantoprazole (PROTONIX) IV  40 mg Intravenous Q24H    Continuous Infusions:  heparin 650 Units/hr (01/18/22 1150)   nitroGLYCERIN 35 mcg/min (01/17/22 2052)     LOS: 3 days     Alma Friendly, MD [facility]  If 7PM-7AM, please contact night-coverage [email or web address] 01/18/2022, 1:05 PM

## 2022-01-18 NOTE — Patient Outreach (Signed)
[facility] Management  01/18/2022  Monique Cabrera 12/07/1935 [id number]   Patient was admitted to the hospital on 01/15/22. Nurse Health Coach will perform case closure and transfer patient to the [facility] Team. Nurse has informed [facility] Coordinator of patient's admission.    Plan: RN Health Coach Discipline Closure Discipline Closure letter sent to PCP   Emelia Loron RN, [place] [phone] [email or web address]

## 2022-01-18 NOTE — Progress Notes (Signed)
ANTICOAGULATION CONSULT NOTE - Follow Up Consult  Pharmacy Consult for Heparin Indication: chest pain/ACS  Allergies  Allergen Reactions   Invokana [Canagliflozin] Itching, Swelling and Other (See Comments)    Vaginal itching, swelling and irritation   Jardiance [Empagliflozin] Itching, Swelling and Other (See Comments)    Vaginal itching and swelling   Tuberculin Ppd Other (See Comments)    Per records from [facility]     Patient Measurements: Height: 5\' 5"  (165.1 cm) Weight: 60.7 kg (133 lb 13.1 oz) IBW/kg (Calculated) : 57 Heparin Dosing Weight: 64.9 kg  Vital Signs: Temp: 97.7 F (36.5 C) (01/23 1238) Temp Source: Oral (01/23 1238) BP: 174/93 (01/23 1238) Pulse Rate: 97 (01/23 1238)  Labs: Recent Labs    01/16/22 0222 01/16/22 0457 01/16/22 0700 01/16/22 2148 01/17/22 0249 01/17/22 1105 01/17/22 1200 01/17/22 2251 01/18/22 0344 01/18/22 1039  HGB 9.0*  --   --   --  9.0*  --   --   --  8.7*  --   HCT 28.3*  --   --   --  28.7*  --   --   --  26.5*  --   PLT 210  --   --   --  192  --   --   --  204  --   HEPARINUNFRC  --   --   --   --   --    < > 0.88* 0.27*  --  0.49  CREATININE 7.59*  --   --   --  8.12*  --   --   --  8.45*  --   TROPONINIHS  --    < > 449* 3,297* 6,368*  --   --   --   --   --    < > = values in this interval not displayed.     Estimated Creatinine Clearance: 4.5 mL/min (A) (by C-G formula based on SCr of 8.45 mg/dL (H)).   Medications:  Scheduled:   aspirin EC  81 mg Oral Daily   clopidogrel  75 mg Oral Daily   hydrALAZINE  25 mg Oral Q8H   insulin aspart  0-6 Units Subcutaneous Q4H   metoCLOPramide (REGLAN) injection  5 mg Intravenous TID WC & HS   metoprolol tartrate  25 mg Oral Q6H   pantoprazole (PROTONIX) IV  40 mg Intravenous Q24H   Infusions:   heparin 650 Units/hr (01/18/22 1150)   nitroGLYCERIN 35 mcg/min (01/17/22 2052)    Assessment: 87 yo F presenting with chest pain, significant bump on troponin. Patient  was not on anticoagulation PTA. Pharmacy consulted to start IV heparin for ACS.   Heparin level this AM is at goal on 650 units/hr. No s/s of overt bleeding noted per RN.    Goal of Therapy:  Heparin level 0.3-0.7 units/ml Monitor platelets by anticoagulation protocol: Yes   Plan:  Continue heparin gtt at 650 units/hr. Daily CBC, heparin level. Monitor for signs/symptoms of bleeding.  Erin Hearing PharmD., BCPS Clinical Pharmacist 01/18/2022 1:29 PM

## 2022-01-18 NOTE — Care Management Important Message (Signed)
Important Message  Patient Details  Name: Monique Cabrera MRN: [id number] Date of Birth: 1935/03/23   Medicare Important Message Given:  Yes     Shelda Altes 01/18/2022, 10:28 AM

## 2022-01-18 NOTE — Plan of Care (Addendum)
Monique Cabrera had goals of care discussion with palliative care and family today. She endorsed that she would like to proceed with IHD. In this case, I discussed LHC with her. We discussed that she had a heart attack and that the management includes heart catheterization. She wanted to proceed. We discussed whether we could reverse her code status for the case, she said she would be full code during the procedure. Will plan for [facility] tomorrow (schedule is full, will take time), then can plan for IHD afterwards per nephrology. NPO at midnight. She has c/f LAD disease. Right radial 2+. IHD post LHC. She's euvolemic and able to lie flat.   The patient understands that risks included but are not limited to stroke (1 in 1000), death (1 in 39), kidney failure [usually temporary] (1 in 500), bleeding (1 in 200), allergic reaction [possibly serious] (1 in 200).

## 2022-01-18 NOTE — H&P (View-Only) (Signed)
Subjective:  She was in distress and agitated this AM. Delirious. Clutching her chest  Objective:  Vitals:   01/17/22 1958 01/18/22 0012 01/18/22 0446 01/18/22 0825  BP: (!) 173/97 (!) 156/90 (!) 151/74 (!) 183/99  Pulse: 95 95 84 96  Resp:    16  Temp: (!) 97.3 F (36.3 C) (!) 97.4 F (36.3 C) (!) 97.4 F (36.3 C) (!) 97.5 F (36.4 C)  TempSrc: Oral Oral Oral Oral  SpO2: 97% 95% 99%   Weight:   60.7 kg   Height:        Intake/Output from previous day:  Intake/Output Summary (Last 24 hours) at 01/18/2022 0907 Last data filed at 01/17/2022 1015 Gross per 24 hour  Intake --  Output 515 ml  Net -515 ml    Physical Exam: GEN: Distressed HEENT: normal Neck supple with no adenopathy Lungs nl wob, clear with no wheezing and good diaphragmatic motion Heart:  S1/S2 S4 gallop no murmur, no rub, gallop or click PMI normal Abdomen:no abdominal distension EXT: PVD RLE amputation  Psych: agitated   Lab Results: Basic Metabolic Panel: Recent Labs    01/16/22 0222 01/17/22 0249 01/18/22 0344  NA 137 138 135  K 4.3 5.1 4.7  CL 106 106 105  CO2 17* 15* 16*  GLUCOSE 103* 202* 164*  BUN 56* 67* 78*  CREATININE 7.59* 8.12* 8.45*  CALCIUM 8.7* 9.1 8.9  MG 1.9  --   --   PHOS  --  9.8* 9.0*   Liver Function Tests: Recent Labs    01/15/22 1631 01/16/22 0222 01/17/22 0249 01/18/22 0344  AST 23 19  --   --   ALT 17 11  --   --   ALKPHOS 53 47  --   --   BILITOT 1.0 0.7  --   --   PROT 6.5 5.4*  --   --   ALBUMIN 3.0* 2.6* 2.7* 2.8*   Recent Labs    01/17/22 1105  LIPASE 23   CBC: Recent Labs    01/17/22 0249 01/18/22 0344  WBC 7.9 11.3*  NEUTROABS 5.6 9.0*  HGB 9.0* 8.7*  HCT 28.7* 26.5*  MCV 97.6 95.3  PLT 192 204     Imaging: CT ABDOMEN PELVIS WO CONTRAST  Result Date: 01/17/2022 CLINICAL DATA:  Nausea and vomiting. EXAM: CT ABDOMEN AND PELVIS WITHOUT CONTRAST TECHNIQUE: Multidetector CT imaging of the abdomen and pelvis was performed  following the standard protocol without IV contrast. RADIATION DOSE REDUCTION: This exam was performed according to the departmental dose-optimization program which includes automated exposure control, adjustment of the mA and/or kV according to patient size and/or use of iterative reconstruction technique. COMPARISON:  CT abdomen pelvis dated 08/21/2012. FINDINGS: Evaluation of this exam is limited in the absence of intravenous contrast as well as respiratory motion. Lower chest: Partially visualized moderate to large bilateral pleural effusions with large areas of consolidative changes of the lower lobes. Advanced 3 vessel coronary vascular calcification. No intra-abdominal free air.  Small free fluid in the pelvis. Hepatobiliary: Small right liver hypodense lesions are not characterized on this noncontrast CT. No intrahepatic biliary dilatation. There is layering sludge or small stones within the gallbladder. Pancreas: The pancreas is poorly visualized and suboptimally evaluated. Spleen: Normal in size without focal abnormality. Adrenals/Urinary Tract: The adrenal glands are grossly unremarkable. Moderate bilateral renal parenchyma atrophy. Indeterminate 2.5 cm hypodense lesion in the upper pole of the right kidney, likely a cyst. There is no hydronephrosis on  either side. The urinary bladder is partially distended and grossly unremarkable. Stomach/Bowel: There is no bowel obstruction or active inflammation. No evidence of acute appendicitis. Vascular/Lymphatic: Advanced aortoiliac atherosclerotic disease as well as advanced mesenteric vascular calcification. The IVC is grossly unremarkable. No portal venous gas. There is no adenopathy. Reproductive: Hysterectomy. Other: There is diffuse subcutaneous edema and anasarca. Musculoskeletal: Osteopenia with degenerative changes of the spine. No acute osseous pathology. IMPRESSION: 1. No acute intra-abdominal or pelvic pathology. No bowel obstruction. 2.  Cholelithiasis. 3. Partially visualized moderate to large bilateral pleural effusions with large areas of consolidative changes of the lower lobes. 4. Aortic Atherosclerosis (ICD10-I70.0). Electronically Signed   By: Anner Crete M.D.   On: 01/17/2022 22:14   DG Abd Portable 1V  Result Date: 01/16/2022 CLINICAL DATA:  Nausea and vomiting. EXAM: PORTABLE ABDOMEN - 1 VIEW COMPARISON:  02/09/2020 FINDINGS: 1459 hours. No gaseous bowel dilatation to suggest obstruction. Dense wall calcification noted in the splenic artery, abdominal aorta and iliac arteries bilaterally. IMPRESSION: 1. Nonobstructive bowel gas pattern. 2. Dense wall calcification in the splenic artery and iliac arteries. 3.  Aortic Atherosclerois (ICD10-170.0) Electronically Signed   By: Misty Stanley M.D.   On: 01/16/2022 15:18   ECHOCARDIOGRAM COMPLETE  Result Date: 01/17/2022    ECHOCARDIOGRAM REPORT   Patient Name:   JAVONA BERGEVIN Date of Exam: 01/17/2022 Medical Rec #:  [id number]        Height:       65.0 in Accession #:    [id number]       Weight:       134.7 lb Date of Birth:  [birth year]        BSA:          1.672 m Patient Age:    87 years         BP:           143/114 mmHg Patient Gender: F                HR:           97 bpm. Exam Location:  Inpatient Procedure: 2D Echo, Cardiac Doppler, Color Doppler and 3D Echo Indications:    CHF-Acute Diastolic D40.81  History:        Patient has prior history of Echocardiogram examinations, most                 recent 02/09/2020. Stroke and PAD, Signs/Symptoms:Edema and                 Altered Mental Status; Risk Factors:Diabetes, Hypertension and                 Dyslipidemia.  Sonographer:    Merrie Roof RDCS Referring Phys: [place] Comments: Image acquisition challenging due to uncooperative patient. The patient woke up and sat straight up and would not lay back down to finish the echo protocol. IMPRESSIONS  1. Septal and apical hypokinesis EF has decreased since echo  done 02/09/20 . Left ventricular ejection fraction, by estimation, is 40 to 45%. The left ventricle has mildly decreased function. The left ventricle has no regional wall motion abnormalities. The left ventricular internal cavity size was mildly dilated. There is mild asymmetric left ventricular hypertrophy of the basal and septal segments. Left ventricular diastolic parameters were normal.  2. Right ventricular systolic function is normal. The right ventricular size is normal.  3. Left atrial size was mildly dilated.  4. The mitral valve is  abnormal. Trivial mitral valve regurgitation. No evidence of mitral stenosis.  5. The aortic valve is tricuspid. There is moderate calcification of the aortic valve. There is moderate thickening of the aortic valve. Aortic valve regurgitation is trivial. Aortic valve sclerosis/calcification is present, without any evidence of aortic stenosis.  6. The inferior vena cava is normal in size with greater than 50% respiratory variability, suggesting right atrial pressure of 3 mmHg. FINDINGS  Left Ventricle: Septal and apical hypokinesis EF has decreased since echo done 02/09/20. Left ventricular ejection fraction, by estimation, is 40 to 45%. The left ventricle has mildly decreased function. The left ventricle has no regional wall motion abnormalities. The left ventricular internal cavity size was mildly dilated. There is mild asymmetric left ventricular hypertrophy of the basal and septal segments. Left ventricular diastolic parameters were normal. Right Ventricle: The right ventricular size is normal. No increase in right ventricular wall thickness. Right ventricular systolic function is normal. Left Atrium: Left atrial size was mildly dilated. Right Atrium: Right atrial size was normal in size. Pericardium: There is no evidence of pericardial effusion. Mitral Valve: The mitral valve is abnormal. There is mild thickening of the mitral valve leaflet(s). There is mild calcification of  the mitral valve leaflet(s). Trivial mitral valve regurgitation. No evidence of mitral valve stenosis. Tricuspid Valve: The tricuspid valve is normal in structure. Tricuspid valve regurgitation is mild . No evidence of tricuspid stenosis. Aortic Valve: The aortic valve is tricuspid. There is moderate calcification of the aortic valve. There is moderate thickening of the aortic valve. Aortic valve regurgitation is trivial. Aortic valve sclerosis/calcification is present, without any evidence of aortic stenosis. Pulmonic Valve: The pulmonic valve was normal in structure. Pulmonic valve regurgitation is mild. No evidence of pulmonic stenosis. Aorta: The aortic root is normal in size and structure. Venous: The inferior vena cava is normal in size with greater than 50% respiratory variability, suggesting right atrial pressure of 3 mmHg. IAS/Shunts: No atrial level shunt detected by color flow Doppler.  LEFT VENTRICLE PLAX 2D LVIDd:         3.70 cm LVIDs:         2.90 cm LV PW:         1.30 cm LV IVS:        1.20 cm LVOT diam:     1.80 cm     3D Volume EF: LVOT Area:     2.54 cm    3D EF:        43 %                            LV EDV:       134 ml                            LV ESV:       76 ml LV Volumes (MOD)           LV SV:        58 ml LV vol d, MOD A4C: 91.7 ml LV vol s, MOD A4C: 56.5 ml LV SV MOD A4C:     91.7 ml LEFT ATRIUM         Index LA diam:    4.10 cm 2.45 cm/m   AORTA Ao Root diam: 3.20 cm Ao Asc diam:  2.90 cm  SHUNTS Systemic Diam: 1.80 cm Jenkins Rouge MD Electronically signed by Jenkins Rouge  MD Signature Date/Time: 01/17/2022/11:57:43 AM    Final     Cardiac Studies:  ECG: no new   Telemetry:  NSR  Echo: 02/09/20 EF 60-65%    TTE 01/17/2022 1. Septal and apical hypokinesis EF has decreased since echo done 02/09/20  . Left ventricular ejection fraction, by estimation, is 40 to 45%. The  left ventricle has mildly decreased function. The left ventricle has no  regional wall motion abnormalities.   The left ventricular internal cavity size was mildly dilated. There is  mild asymmetric left ventricular hypertrophy of the basal and septal  segments. Left ventricular diastolic parameters were normal.   2. Right ventricular systolic function is normal. The right ventricular  size is normal.   3. Left atrial size was mildly dilated.   4. The mitral valve is abnormal. Trivial mitral valve regurgitation. No  evidence of mitral stenosis.   5. The aortic valve is tricuspid. There is moderate calcification of the  aortic valve. There is moderate thickening of the aortic valve. Aortic  valve regurgitation is trivial. Aortic valve sclerosis/calcification is  present, without any evidence of  aortic stenosis.   6. The inferior vena cava is normal in size with greater than 50%  respiratory variability, suggesting right atrial pressure of 3 mmHg.   Medications:    aspirin EC  81 mg Oral Daily   clopidogrel  75 mg Oral Daily   hydrALAZINE  25 mg Oral Q8H   insulin aspart  0-6 Units Subcutaneous Q4H   metoCLOPramide (REGLAN) injection  5 mg Intravenous TID WC & HS   metoprolol tartrate  25 mg Oral Q6H   pantoprazole (PROTONIX) IV  40 mg Intravenous Q24H      heparin 650 Units/hr (01/18/22 0025)   nitroGLYCERIN 35 mcg/min (01/17/22 2052)    Assessment/Plan:  87 y.o. with history CRF, PVD post RLE amputation HTN and CVA admitted with dyspnea and chest pain with evolving SEMI and mildly reduced EF 40-45% with LAD territory hypokinesis that's new since 02/09/2020, in sinus rhythm  Myocardial Infarction:  she  has evolved an MI with troponin up to 6368.  ECG with no acute ST elevation. He was started on heparin and nitro. She's on ASA/Plavix and beta blocker. Will continue. Considering delirium c/f uremia unable to cooperate with IHD, she is high risk for worsening renal failure with LHC. If decision that she goes comfort measures, will not plan for LHC.Otherwise, she is euvolemic - medical  management of MI - Palliative meeting today at 2pm - recommend 48 hours heparin total - continue nitro gtt as BP allows - cont metop 25 mg q6H   Janina Mayo 01/18/2022, 9:07 AM

## 2022-01-18 NOTE — Progress Notes (Addendum)
Subjective:  Does not appear to be change in status-  pt agitated -  seems uncomfortable-  "what do you want"   Objective Vital signs in last 24 hours: Vitals:   01/17/22 1958 01/18/22 0012 01/18/22 0446 01/18/22 0825  BP: (!) 173/97 (!) 156/90 (!) 151/74 (!) 183/99  Pulse: 95 95 84 96  Resp:    16  Temp: (!) 97.3 F (36.3 C) (!) 97.4 F (36.3 C) (!) 97.4 F (36.3 C) (!) 97.5 F (36.4 C)  TempSrc: Oral Oral Oral Oral  SpO2: 97% 95% 99%   Weight:   60.7 kg   Height:       Weight change: -0.3 kg No intake or output data in the 24 hours ending 01/18/22 1128  Assessment/ Plan: Pt is a 87 y.o. yo female with known CKD who was admitted on 01/15/2022 with acute coronary event  Assessment/Plan: 1. Acute coronary event-  now on nitro and heparin-  attempting to medically manage-  concern about doing heart cath given current state and advanced CKD 2. Renal-  apparently had been asked several times as OP if she wanted dialysis and she always declined even when faced with decision that she could die without HD -  this is documented in the [facility] record.  Palliative to meet with family today-  crt worsening since been here-  GFR very low but no acute indications for HD-  could possibly be having sxms related to this CKD 3. Anemia-  hgb dropping on heparin   5. HTN/volume-  hypertensive-  volume seems OK-  on nitro/hydralazine/lopressor   Addendum: I was able to participate in the palliative care meeting today-  patient seemed lucid but did not necessarily recall conversations that she had with Dr. Johnney Ou-  wanted to know more about dialysis which I told her the details on in center HD as it seems that is what she would need-  I also talked to the family present-  they wanted to do whatever it took to help their Mom get better and patient did eventually agree to give dialysis a try.  There are no absolute indications for HD tonight-  I dont know if cardiology is planning a procedure-  if she is still  actively having cardiac ischemia.  I would prefer a more cardiac situation prior to HD if that is possible-  will consult IR for a temp vs permcath for initiation soon-  I anticipate by Wednesday ?   Monique Cabrera    Labs: Basic Metabolic Panel: Recent Labs  Lab 01/16/22 0222 01/17/22 0249 01/18/22 0344  NA 137 138 135  K 4.3 5.1 4.7  CL 106 106 105  CO2 17* 15* 16*  GLUCOSE 103* 202* 164*  BUN 56* 67* 78*  CREATININE 7.59* 8.12* 8.45*  CALCIUM 8.7* 9.1 8.9  PHOS  --  9.8* 9.0*   Liver Function Tests: Recent Labs  Lab 01/15/22 1631 01/16/22 0222 01/17/22 0249 01/18/22 0344  AST 23 19  --   --   ALT 17 11  --   --   ALKPHOS 53 47  --   --   BILITOT 1.0 0.7  --   --   PROT 6.5 5.4*  --   --   ALBUMIN 3.0* 2.6* 2.7* 2.8*   Recent Labs  Lab 01/17/22 1105  LIPASE 23   No results for input(s): AMMONIA in the last 168 hours. CBC: Recent Labs  Lab 01/15/22 1631 01/16/22 0222 01/17/22 0249 01/18/22 0344  WBC 8.7 7.7 7.9 11.3*  NEUTROABS 6.5 5.6 5.6 9.0*  HGB 11.0* 9.0* 9.0* 8.7*  HCT 33.7* 28.3* 28.7* 26.5*  MCV 98.3 96.3 97.6 95.3  PLT 229 210 192 204   Cardiac Enzymes: No results for input(s): CKTOTAL, CKMB, CKMBINDEX, TROPONINI in the last 168 hours. CBG: Recent Labs  Lab 01/17/22 1715 01/17/22 2001 01/18/22 0011 01/18/22 0448 01/18/22 0824  GLUCAP 181* 178* 165* 157* 162*    Iron Studies: No results for input(s): IRON, TIBC, TRANSFERRIN, FERRITIN in the last 72 hours. Studies/Results: CT ABDOMEN PELVIS WO CONTRAST  Result Date: 01/17/2022 CLINICAL DATA:  Nausea and vomiting. EXAM: CT ABDOMEN AND PELVIS WITHOUT CONTRAST TECHNIQUE: Multidetector CT imaging of the abdomen and pelvis was performed following the standard protocol without IV contrast. RADIATION DOSE REDUCTION: This exam was performed according to the departmental dose-optimization program which includes automated exposure control, adjustment of the mA and/or kV according to  patient size and/or use of iterative reconstruction technique. COMPARISON:  CT abdomen pelvis dated 08/21/2012. FINDINGS: Evaluation of this exam is limited in the absence of intravenous contrast as well as respiratory motion. Lower chest: Partially visualized moderate to large bilateral pleural effusions with large areas of consolidative changes of the lower lobes. Advanced 3 vessel coronary vascular calcification. No intra-abdominal free air.  Small free fluid in the pelvis. Hepatobiliary: Small right liver hypodense lesions are not characterized on this noncontrast CT. No intrahepatic biliary dilatation. There is layering sludge or small stones within the gallbladder. Pancreas: The pancreas is poorly visualized and suboptimally evaluated. Spleen: Normal in size without focal abnormality. Adrenals/Urinary Tract: The adrenal glands are grossly unremarkable. Moderate bilateral renal parenchyma atrophy. Indeterminate 2.5 cm hypodense lesion in the upper pole of the right kidney, likely a cyst. There is no hydronephrosis on either side. The urinary bladder is partially distended and grossly unremarkable. Stomach/Bowel: There is no bowel obstruction or active inflammation. No evidence of acute appendicitis. Vascular/Lymphatic: Advanced aortoiliac atherosclerotic disease as well as advanced mesenteric vascular calcification. The IVC is grossly unremarkable. No portal venous gas. There is no adenopathy. Reproductive: Hysterectomy. Other: There is diffuse subcutaneous edema and anasarca. Musculoskeletal: Osteopenia with degenerative changes of the spine. No acute osseous pathology. IMPRESSION: 1. No acute intra-abdominal or pelvic pathology. No bowel obstruction. 2. Cholelithiasis. 3. Partially visualized moderate to large bilateral pleural effusions with large areas of consolidative changes of the lower lobes. 4. Aortic Atherosclerosis (ICD10-I70.0). Electronically Signed   By: Anner Crete M.D.   On: 01/17/2022  22:14   DG Abd Portable 1V  Result Date: 01/16/2022 CLINICAL DATA:  Nausea and vomiting. EXAM: PORTABLE ABDOMEN - 1 VIEW COMPARISON:  02/09/2020 FINDINGS: 1459 hours. No gaseous bowel dilatation to suggest obstruction. Dense wall calcification noted in the splenic artery, abdominal aorta and iliac arteries bilaterally. IMPRESSION: 1. Nonobstructive bowel gas pattern. 2. Dense wall calcification in the splenic artery and iliac arteries. 3.  Aortic Atherosclerois (ICD10-170.0) Electronically Signed   By: Misty Stanley M.D.   On: 01/16/2022 15:18   ECHOCARDIOGRAM COMPLETE  Result Date: 01/17/2022    ECHOCARDIOGRAM REPORT   Patient Name:   Monique Cabrera Date of Exam: 01/17/2022 Medical Rec #:  [id number]        Height:       65.0 in Accession #:    [id number]       Weight:       134.7 lb Date of Birth:  February 14, 1935        BSA:  1.672 m Patient Age:    41 years         BP:           143/114 mmHg Patient Gender: F                HR:           97 bpm. Exam Location:  Inpatient Procedure: 2D Echo, Cardiac Doppler, Color Doppler and 3D Echo Indications:    CHF-Acute Diastolic W10.93  History:        Patient has prior history of Echocardiogram examinations, most                 recent 02/09/2020. Stroke and PAD, Signs/Symptoms:Edema and                 Altered Mental Status; Risk Factors:Diabetes, Hypertension and                 Dyslipidemia.  Sonographer:    Merrie Roof RDCS Referring Phys: [place] Comments: Image acquisition challenging due to uncooperative patient. The patient woke up and sat straight up and would not lay back down to finish the echo protocol. IMPRESSIONS  1. Septal and apical hypokinesis EF has decreased since echo done 02/09/20 . Left ventricular ejection fraction, by estimation, is 40 to 45%. The left ventricle has mildly decreased function. The left ventricle has no regional wall motion abnormalities. The left ventricular internal cavity size was mildly dilated. There  is mild asymmetric left ventricular hypertrophy of the basal and septal segments. Left ventricular diastolic parameters were normal.  2. Right ventricular systolic function is normal. The right ventricular size is normal.  3. Left atrial size was mildly dilated.  4. The mitral valve is abnormal. Trivial mitral valve regurgitation. No evidence of mitral stenosis.  5. The aortic valve is tricuspid. There is moderate calcification of the aortic valve. There is moderate thickening of the aortic valve. Aortic valve regurgitation is trivial. Aortic valve sclerosis/calcification is present, without any evidence of aortic stenosis.  6. The inferior vena cava is normal in size with greater than 50% respiratory variability, suggesting right atrial pressure of 3 mmHg. FINDINGS  Left Ventricle: Septal and apical hypokinesis EF has decreased since echo done 02/09/20. Left ventricular ejection fraction, by estimation, is 40 to 45%. The left ventricle has mildly decreased function. The left ventricle has no regional wall motion abnormalities. The left ventricular internal cavity size was mildly dilated. There is mild asymmetric left ventricular hypertrophy of the basal and septal segments. Left ventricular diastolic parameters were normal. Right Ventricle: The right ventricular size is normal. No increase in right ventricular wall thickness. Right ventricular systolic function is normal. Left Atrium: Left atrial size was mildly dilated. Right Atrium: Right atrial size was normal in size. Pericardium: There is no evidence of pericardial effusion. Mitral Valve: The mitral valve is abnormal. There is mild thickening of the mitral valve leaflet(s). There is mild calcification of the mitral valve leaflet(s). Trivial mitral valve regurgitation. No evidence of mitral valve stenosis. Tricuspid Valve: The tricuspid valve is normal in structure. Tricuspid valve regurgitation is mild . No evidence of tricuspid stenosis. Aortic Valve: The  aortic valve is tricuspid. There is moderate calcification of the aortic valve. There is moderate thickening of the aortic valve. Aortic valve regurgitation is trivial. Aortic valve sclerosis/calcification is present, without any evidence of aortic stenosis. Pulmonic Valve: The pulmonic valve was normal in structure. Pulmonic valve regurgitation is mild. No evidence  of pulmonic stenosis. Aorta: The aortic root is normal in size and structure. Venous: The inferior vena cava is normal in size with greater than 50% respiratory variability, suggesting right atrial pressure of 3 mmHg. IAS/Shunts: No atrial level shunt detected by color flow Doppler.  LEFT VENTRICLE PLAX 2D LVIDd:         3.70 cm LVIDs:         2.90 cm LV PW:         1.30 cm LV IVS:        1.20 cm LVOT diam:     1.80 cm     3D Volume EF: LVOT Area:     2.54 cm    3D EF:        43 %                            LV EDV:       134 ml                            LV ESV:       76 ml LV Volumes (MOD)           LV SV:        58 ml LV vol d, MOD A4C: 91.7 ml LV vol s, MOD A4C: 56.5 ml LV SV MOD A4C:     91.7 ml LEFT ATRIUM         Index LA diam:    4.10 cm 2.45 cm/m   AORTA Ao Root diam: 3.20 cm Ao Asc diam:  2.90 cm  SHUNTS Systemic Diam: 1.80 cm Jenkins Rouge MD Electronically signed by Jenkins Rouge MD Signature Date/Time: 01/17/2022/11:57:43 AM    Final    Medications: Infusions:  heparin 650 Units/hr (01/18/22 0025)   nitroGLYCERIN 35 mcg/min (01/17/22 2052)    Scheduled Medications:  aspirin EC  81 mg Oral Daily   clopidogrel  75 mg Oral Daily   hydrALAZINE  25 mg Oral Q8H   insulin aspart  0-6 Units Subcutaneous Q4H   metoCLOPramide (REGLAN) injection  5 mg Intravenous TID WC & HS   metoprolol tartrate  25 mg Oral Q6H   pantoprazole (PROTONIX) IV  40 mg Intravenous Q24H    have reviewed scheduled and prn medications.  Physical Exam: General: thin, agitated Heart: tachy  Lungs: mostly clear Abdomen: soft, non tender Extremities: no  significant peripheral edema     01/18/2022,11:28 AM  LOS: 3 days

## 2022-01-18 NOTE — Progress Notes (Signed)
Subjective:  Monique Cabrera was in distress and agitated this AM. Delirious. Clutching her chest  Objective:  Vitals:   01/17/22 1958 01/18/22 0012 01/18/22 0446 01/18/22 0825  BP: (!) 173/97 (!) 156/90 (!) 151/74 (!) 183/99  Pulse: 95 95 84 96  Resp:    16  Temp: (!) 97.3 F (36.3 C) (!) 97.4 F (36.3 C) (!) 97.4 F (36.3 C) (!) 97.5 F (36.4 C)  TempSrc: Oral Oral Oral Oral  SpO2: 97% 95% 99%   Weight:   60.7 kg   Height:        Intake/Output from previous day:  Intake/Output Summary (Last 24 hours) at 01/18/2022 0907 Last data filed at 01/17/2022 1015 Gross per 24 hour  Intake --  Output 515 ml  Net -515 ml    Physical Exam: GEN: Distressed HEENT: normal Neck supple with no adenopathy Lungs nl wob, clear with no wheezing and good diaphragmatic motion Heart:  S1/S2 S4 gallop no murmur, no rub, gallop or click PMI normal Abdomen:no abdominal distension EXT: PVD RLE amputation  Psych: agitated   Lab Results: Basic Metabolic Panel: Recent Labs    01/16/22 0222 01/17/22 0249 01/18/22 0344  NA 137 138 135  K 4.3 5.1 4.7  CL 106 106 105  CO2 17* 15* 16*  GLUCOSE 103* 202* 164*  BUN 56* 67* 78*  CREATININE 7.59* 8.12* 8.45*  CALCIUM 8.7* 9.1 8.9  MG 1.9  --   --   PHOS  --  9.8* 9.0*   Liver Function Tests: Recent Labs    01/15/22 1631 01/16/22 0222 01/17/22 0249 01/18/22 0344  AST 23 19  --   --   ALT 17 11  --   --   ALKPHOS 53 47  --   --   BILITOT 1.0 0.7  --   --   PROT 6.5 5.4*  --   --   ALBUMIN 3.0* 2.6* 2.7* 2.8*   Recent Labs    01/17/22 1105  LIPASE 23   CBC: Recent Labs    01/17/22 0249 01/18/22 0344  WBC 7.9 11.3*  NEUTROABS 5.6 9.0*  HGB 9.0* 8.7*  HCT 28.7* 26.5*  MCV 97.6 95.3  PLT 192 204     Imaging: CT ABDOMEN PELVIS WO CONTRAST  Result Date: 01/17/2022 CLINICAL DATA:  Nausea and vomiting. EXAM: CT ABDOMEN AND PELVIS WITHOUT CONTRAST TECHNIQUE: Multidetector CT imaging of the abdomen and pelvis was performed  following the standard protocol without IV contrast. RADIATION DOSE REDUCTION: This exam was performed according to the departmental dose-optimization program which includes automated exposure control, adjustment of the mA and/or kV according to patient size and/or use of iterative reconstruction technique. COMPARISON:  CT abdomen pelvis dated 08/21/2012. FINDINGS: Evaluation of this exam is limited in the absence of intravenous contrast as well as respiratory motion. Lower chest: Partially visualized moderate to large bilateral pleural effusions with large areas of consolidative changes of the lower lobes. Advanced 3 vessel coronary vascular calcification. No intra-abdominal free air.  Small free fluid in the pelvis. Hepatobiliary: Small right liver hypodense lesions are not characterized on this noncontrast CT. No intrahepatic biliary dilatation. There is layering sludge or small stones within the gallbladder. Pancreas: The pancreas is poorly visualized and suboptimally evaluated. Spleen: Normal in size without focal abnormality. Adrenals/Urinary Tract: The adrenal glands are grossly unremarkable. Moderate bilateral renal parenchyma atrophy. Indeterminate 2.5 cm hypodense lesion in the upper pole of the right kidney, likely a cyst. There is no hydronephrosis on  either side. The urinary bladder is partially distended and grossly unremarkable. Stomach/Bowel: There is no bowel obstruction or active inflammation. No evidence of acute appendicitis. Vascular/Lymphatic: Advanced aortoiliac atherosclerotic disease as well as advanced mesenteric vascular calcification. The IVC is grossly unremarkable. No portal venous gas. There is no adenopathy. Reproductive: Hysterectomy. Other: There is diffuse subcutaneous edema and anasarca. Musculoskeletal: Osteopenia with degenerative changes of the spine. No acute osseous pathology. IMPRESSION: 1. No acute intra-abdominal or pelvic pathology. No bowel obstruction. 2.  Cholelithiasis. 3. Partially visualized moderate to large bilateral pleural effusions with large areas of consolidative changes of the lower lobes. 4. Aortic Atherosclerosis (ICD10-I70.0). Electronically Signed   By: Anner Crete M.D.   On: 01/17/2022 22:14   DG Abd Portable 1V  Result Date: 01/16/2022 CLINICAL DATA:  Nausea and vomiting. EXAM: PORTABLE ABDOMEN - 1 VIEW COMPARISON:  02/09/2020 FINDINGS: 1459 hours. No gaseous bowel dilatation to suggest obstruction. Dense wall calcification noted in the splenic artery, abdominal aorta and iliac arteries bilaterally. IMPRESSION: 1. Nonobstructive bowel gas pattern. 2. Dense wall calcification in the splenic artery and iliac arteries. 3.  Aortic Atherosclerois (ICD10-170.0) Electronically Signed   By: Misty Stanley M.D.   On: 01/16/2022 15:18   ECHOCARDIOGRAM COMPLETE  Result Date: 01/17/2022    ECHOCARDIOGRAM REPORT   Patient Name:   Monique Cabrera Date of Exam: 01/17/2022 Medical Rec #:  [id number]        Height:       65.0 in Accession #:    [id number]       Weight:       134.7 lb Date of Birth:  [birth year]        BSA:          1.672 m Patient Age:    87 years         BP:           143/114 mmHg Patient Gender: F                HR:           97 bpm. Exam Location:  Inpatient Procedure: 2D Echo, Cardiac Doppler, Color Doppler and 3D Echo Indications:    CHF-Acute Diastolic O96.29  History:        Patient has prior history of Echocardiogram examinations, most                 recent 02/09/2020. Stroke and PAD, Signs/Symptoms:Edema and                 Altered Mental Status; Risk Factors:Diabetes, Hypertension and                 Dyslipidemia.  Sonographer:    Merrie Roof RDCS Referring Phys: [place]  Comments: Image acquisition challenging due to uncooperative patient. The patient woke up and sat straight up and would not lay back down to finish the echo protocol. IMPRESSIONS  1. Septal and apical hypokinesis EF has decreased since echo  done 02/09/20 . Left ventricular ejection fraction, by estimation, is 40 to 45%. The left ventricle has mildly decreased function. The left ventricle has no regional wall motion abnormalities. The left ventricular internal cavity size was mildly dilated. There is mild asymmetric left ventricular hypertrophy of the basal and septal segments. Left ventricular diastolic parameters were normal.  2. Right ventricular systolic function is normal. The right ventricular size is normal.  3. Left atrial size was mildly dilated.  4. The mitral valve is  abnormal. Trivial mitral valve regurgitation. No evidence of mitral stenosis.  5. The aortic valve is tricuspid. There is moderate calcification of the aortic valve. There is moderate thickening of the aortic valve. Aortic valve regurgitation is trivial. Aortic valve sclerosis/calcification is present, without any evidence of aortic stenosis.  6. The inferior vena cava is normal in size with greater than 50% respiratory variability, suggesting right atrial pressure of 3 mmHg. FINDINGS  Left Ventricle: Septal and apical hypokinesis EF has decreased since echo done 02/09/20. Left ventricular ejection fraction, by estimation, is 40 to 45%. The left ventricle has mildly decreased function. The left ventricle has no regional wall motion abnormalities. The left ventricular internal cavity size was mildly dilated. There is mild asymmetric left ventricular hypertrophy of the basal and septal segments. Left ventricular diastolic parameters were normal. Right Ventricle: The right ventricular size is normal. No increase in right ventricular wall thickness. Right ventricular systolic function is normal. Left Atrium: Left atrial size was mildly dilated. Right Atrium: Right atrial size was normal in size. Pericardium: There is no evidence of pericardial effusion. Mitral Valve: The mitral valve is abnormal. There is mild thickening of the mitral valve leaflet(s). There is mild calcification of  the mitral valve leaflet(s). Trivial mitral valve regurgitation. No evidence of mitral valve stenosis. Tricuspid Valve: The tricuspid valve is normal in structure. Tricuspid valve regurgitation is mild . No evidence of tricuspid stenosis. Aortic Valve: The aortic valve is tricuspid. There is moderate calcification of the aortic valve. There is moderate thickening of the aortic valve. Aortic valve regurgitation is trivial. Aortic valve sclerosis/calcification is present, without any evidence of aortic stenosis. Pulmonic Valve: The pulmonic valve was normal in structure. Pulmonic valve regurgitation is mild. No evidence of pulmonic stenosis. Aorta: The aortic root is normal in size and structure. Venous: The inferior vena cava is normal in size with greater than 50% respiratory variability, suggesting right atrial pressure of 3 mmHg. IAS/Shunts: No atrial level shunt detected by color flow Doppler.  LEFT VENTRICLE PLAX 2D LVIDd:         3.70 cm LVIDs:         2.90 cm LV PW:         1.30 cm LV IVS:        1.20 cm LVOT diam:     1.80 cm     3D Volume EF: LVOT Area:     2.54 cm    3D EF:        43 %                            LV EDV:       134 ml                            LV ESV:       76 ml LV Volumes (MOD)           LV SV:        58 ml LV vol d, MOD A4C: 91.7 ml LV vol s, MOD A4C: 56.5 ml LV SV MOD A4C:     91.7 ml LEFT ATRIUM         Index LA diam:    4.10 cm 2.45 cm/m   AORTA Ao Root diam: 3.20 cm Ao Asc diam:  2.90 cm  SHUNTS Systemic Diam: 1.80 cm Jenkins Rouge MD Electronically signed by Jenkins Rouge  MD Signature Date/Time: 01/17/2022/11:57:43 AM    Final     Cardiac Studies:  ECG: no new   Telemetry:  NSR  Echo: 02/09/20 EF 60-65%    TTE 01/17/2022 1. Septal and apical hypokinesis EF has decreased since echo done 02/09/20  . Left ventricular ejection fraction, by estimation, is 40 to 45%. The  left ventricle has mildly decreased function. The left ventricle has no  regional wall motion abnormalities.   The left ventricular internal cavity size was mildly dilated. There is  mild asymmetric left ventricular hypertrophy of the basal and septal  segments. Left ventricular diastolic parameters were normal.   2. Right ventricular systolic function is normal. The right ventricular  size is normal.   3. Left atrial size was mildly dilated.   4. The mitral valve is abnormal. Trivial mitral valve regurgitation. No  evidence of mitral stenosis.   5. The aortic valve is tricuspid. There is moderate calcification of the  aortic valve. There is moderate thickening of the aortic valve. Aortic  valve regurgitation is trivial. Aortic valve sclerosis/calcification is  present, without any evidence of  aortic stenosis.   6. The inferior vena cava is normal in size with greater than 50%  respiratory variability, suggesting right atrial pressure of 3 mmHg.   Medications:    aspirin EC  81 mg Oral Daily   clopidogrel  75 mg Oral Daily   hydrALAZINE  25 mg Oral Q8H   insulin aspart  0-6 Units Subcutaneous Q4H   metoCLOPramide (REGLAN) injection  5 mg Intravenous TID WC & HS   metoprolol tartrate  25 mg Oral Q6H   pantoprazole (PROTONIX) IV  40 mg Intravenous Q24H      heparin 650 Units/hr (01/18/22 0025)   nitroGLYCERIN 35 mcg/min (01/17/22 2052)    Assessment/Plan:  87 y.o. with history CRF, PVD post RLE amputation HTN and CVA admitted with dyspnea and chest pain with evolving SEMI and mildly reduced EF 40-45% with LAD territory hypokinesis that's new since 02/09/2020, in sinus rhythm  Myocardial Infarction:  Monique Cabrera  has evolved an MI with troponin up to 6368.  ECG with no acute ST elevation. He was started on heparin and nitro. Monique Cabrera's on ASA/Plavix and beta blocker. Will continue. Considering delirium c/f uremia unable to cooperate with IHD, Monique Cabrera is high risk for worsening renal failure with LHC. If decision that Monique Cabrera goes comfort measures, will not plan for LHC.Otherwise, Monique Cabrera is euvolemic - medical  management of MI - Palliative meeting today at 2pm - recommend 48 hours heparin total - continue nitro gtt as BP allows - cont metop 25 mg q6H   Janina Mayo 01/18/2022, 9:07 AM

## 2022-01-18 NOTE — Progress Notes (Signed)
Palliative Care Progress Note, Assessment & Plan   Patient Name: Monique Cabrera       Date: 01/18/2022 DOB: 18-Feb-1935  Age: 87 y.o. MRN#: [id number] Attending Physician: Alma Friendly, MD Primary Care Physician: Wardell Honour, MD Admit Date: 01/15/2022  Reason for Consultation/Follow-up: Establishing goals of care  Subjective: Patient is lying in bed in NAD. She does not acknowledge my presence but is easily arousable. She denies N/V or pain.  Family meeting scheduled via Zoom at 2pm.   HPI: 87 y.o. female  with past medical history of diastolic CHF, CKD (stage V), type 2 diabetes, HTN, and status post right BKA admitted on 01/15/2022 with hypertensive crisis, chest pain, shortness of breath with associated nausea and vomiting.  On admission patient found to have bilateral pleural effusions.  Summary of counseling/coordination of care: After reviewing the patient's chart and assessing the patient, I met with patient at bedside with Dr. Moshe Cipro and Dr. Horris Latino. Patient's son [name] and her daughters Caryl Asp and Ann Held were present via [place].  Medical update given to family from both MDs present. HD inpatient and outpatient reviewed in detail. Risk vs benefits discussed. Cardac issues discussed in detail.   Patient shared she would like to proceed with HD. Family in agreement. Family shared concerns of patient being in pain. Education provided that pain may not resolve unless cardiac and renal issues are resolved.  Questions and concerns addressed. Plan is for HD with cardiology to consult regarding interventions prior to initiation of HD.  Therapeutic silence and active listening provided for patient and family to share their thoughts and emotions regarding patient's current health status.  Discussion of faith and importance of being in God's hands vocalized from patient and family.   Code Status: DNR  Prognosis: Unable to determine  Discharge Planning: To Be Determined  Recommendations/Plan: Plan for HD and recommendations from cardiology Treat the treatable Continue aggressive N/V and pain management  Care plan was discussed with Dr. Moshe Cipro, Dr. Horris Latino, nursing, patient, patient's son and two daughters  Physical Exam Vitals and nursing note reviewed.  Constitutional:      General: She is not in acute distress.    Appearance: She is not ill-appearing.  HENT:     Head: Normocephalic and atraumatic.  Cardiovascular:     Rate and Rhythm: Normal rate.  Pulmonary:     Effort: Pulmonary effort is normal.  Abdominal:     Palpations: Abdomen is soft.  Musculoskeletal:     Comments: Generalized weakness - R BKA  Neurological:     Mental Status: She is alert and oriented to person, place, and time.  Psychiatric:        Mood and Affect: Mood normal. Mood is not anxious.        Behavior: Behavior normal. Behavior is not agitated.            Palliative Assessment/Data: 60%    Total Time 60 minutes  Greater than 50%  of this time was spent counseling and coordinating care related to the above assessment and plan.  Thank you for allowing the Palliative Medicine Team to assist in the care of this patient.  [place] Rosana Berger, DNP, FNP-BC Palliative  Medicine Team Team Phone # [phone]

## 2022-01-19 ENCOUNTER — Encounter (HOSPITAL_COMMUNITY)
Admission: EM | Disposition: A | Discharge status: Home Health | Payer: Self-pay | Source: Home / Self Care | Attending: Internal Medicine

## 2022-01-19 ENCOUNTER — Inpatient Hospital Stay (HOSPITAL_COMMUNITY): Payer: Medicare Other

## 2022-01-19 ENCOUNTER — Encounter (HOSPITAL_COMMUNITY): Payer: Self-pay | Admitting: Internal Medicine

## 2022-01-19 DIAGNOSIS — Z789 Other specified health status: Secondary | ICD-10-CM

## 2022-01-19 HISTORY — PX: IR FLUORO GUIDE CV LINE RIGHT: IMG2283

## 2022-01-19 HISTORY — PX: IR US GUIDE VASC ACCESS RIGHT: IMG2390

## 2022-01-19 LAB — RENAL FUNCTION PANEL
Albumin: 2.8 g/dL — ABNORMAL LOW (ref 3.5–5.0)
Anion gap: 14 (ref 5–15)
BUN: 71 mg/dL — ABNORMAL HIGH (ref 8–23)
CO2: 16 mmol/L — ABNORMAL LOW (ref 22–32)
Calcium: 8.9 mg/dL (ref 8.9–10.3)
Chloride: 103 mmol/L (ref 98–111)
Creatinine, Ser: 9.05 mg/dL — ABNORMAL HIGH (ref 0.44–1.00)
GFR, Estimated: 4 mL/min — ABNORMAL LOW (ref >60)
Glucose, Bld: 137 mg/dL — ABNORMAL HIGH (ref 70–99)
Phosphorus: 8.4 mg/dL — ABNORMAL HIGH (ref 2.5–4.6)
Potassium: 4.3 mmol/L (ref 3.5–5.1)
Sodium: 133 mmol/L — ABNORMAL LOW (ref 135–145)

## 2022-01-19 LAB — CBC WITH DIFFERENTIAL/PLATELET
Abs Immature Granulocytes: 0.1 10*3/uL — ABNORMAL HIGH (ref 0.00–0.07)
Basophils Absolute: 0 10*3/uL (ref 0.0–0.1)
Basophils Relative: 0 %
Eosinophils Absolute: 0 10*3/uL (ref 0.0–0.5)
Eosinophils Relative: 0 %
HCT: 25.1 % — ABNORMAL LOW (ref 36.0–46.0)
Hemoglobin: 8.4 g/dL — ABNORMAL LOW (ref 12.0–15.0)
Immature Granulocytes: 1 %
Lymphocytes Relative: 10 %
Lymphs Abs: 1 10*3/uL (ref 0.7–4.0)
MCH: 31.2 pg (ref 26.0–34.0)
MCHC: 33.5 g/dL (ref 30.0–36.0)
MCV: 93.3 fL (ref 80.0–100.0)
Monocytes Absolute: 1 10*3/uL (ref 0.1–1.0)
Monocytes Relative: 10 %
Neutro Abs: 8.1 10*3/uL — ABNORMAL HIGH (ref 1.7–7.7)
Neutrophils Relative %: 79 %
Platelets: 196 10*3/uL (ref 150–400)
RBC: 2.69 MIL/uL — ABNORMAL LOW (ref 3.87–5.11)
RDW: 13.8 % (ref 11.5–15.5)
WBC: 10.3 10*3/uL (ref 4.0–10.5)
nRBC: 0.2 % (ref 0.0–0.2)

## 2022-01-19 LAB — HEPATITIS B SURFACE ANTIGEN: Hepatitis B Surface Ag: NONREACTIVE

## 2022-01-19 LAB — GLUCOSE, CAPILLARY
Glucose-Capillary: 137 mg/dL — ABNORMAL HIGH (ref 70–99)
Glucose-Capillary: 161 mg/dL — ABNORMAL HIGH (ref 70–99)
Glucose-Capillary: 146 mg/dL — ABNORMAL HIGH (ref 70–99)
Glucose-Capillary: 146 mg/dL — ABNORMAL HIGH (ref 70–99)
Glucose-Capillary: 147 mg/dL — ABNORMAL HIGH (ref 70–99)

## 2022-01-19 LAB — HEPARIN LEVEL (UNFRACTIONATED): Heparin Unfractionated: 0.39 IU/mL (ref 0.30–0.70)

## 2022-01-19 SURGERY — LEFT HEART CATH AND CORONARY ANGIOGRAPHY
Anesthesia: LOCAL

## 2022-01-19 MED ORDER — SODIUM CHLORIDE 0.9 % IV SOLN: 100.0000 mL | INTRAVENOUS | Status: DC | PRN

## 2022-01-19 MED ORDER — PENTAFLUOROPROP-TETRAFLUOROETH EX AERO: 1.0000 "application " | INHALATION_SPRAY | CUTANEOUS | Status: DC | PRN

## 2022-01-19 MED ORDER — HEPARIN SODIUM (PORCINE) 1000 UNIT/ML IJ SOLN
INTRAMUSCULAR | Status: AC
  Filled 2022-01-19: qty 10

## 2022-01-19 MED ORDER — LIDOCAINE-PRILOCAINE 2.5-2.5 % EX CREA
1.0000 "application " | TOPICAL_CREAM | CUTANEOUS | Status: DC | PRN
  Filled 2022-01-19: qty 5

## 2022-01-19 MED ORDER — CHLORHEXIDINE GLUCONATE CLOTH 2 % EX PADS: 6.0000 | MEDICATED_PAD | Freq: Every day | CUTANEOUS | Status: DC

## 2022-01-19 MED ORDER — ALTEPLASE 2 MG IJ SOLR
2.0000 mg | Freq: Once | INTRAMUSCULAR | Status: AC | PRN
  Administered 2022-01-20: 15:00:00 2 mg
  Filled 2022-01-19 (×2): qty 2

## 2022-01-19 MED ORDER — HEPARIN SODIUM (PORCINE) 1000 UNIT/ML DIALYSIS
1000.0000 [IU] | INTRAMUSCULAR | Status: DC | PRN
  Filled 2022-01-19 (×2): qty 1

## 2022-01-19 MED ORDER — VERAPAMIL HCL 2.5 MG/ML IV SOLN
INTRAVENOUS | Status: AC
  Filled 2022-01-19: qty 2

## 2022-01-19 MED ORDER — CHLORHEXIDINE GLUCONATE CLOTH 2 % EX PADS
6.0000 | MEDICATED_PAD | Freq: Every day | CUTANEOUS | Status: DC
  Administered 2022-01-19 – 2022-01-26 (×8): 6 via TOPICAL

## 2022-01-19 MED ORDER — HEPARIN (PORCINE) IN NACL 1000-0.9 UT/500ML-% IV SOLN
INTRAVENOUS | Status: AC
  Filled 2022-01-19: qty 1000

## 2022-01-19 MED ORDER — LIDOCAINE HCL (PF) 1 % IJ SOLN
INTRAMUSCULAR | Status: AC
  Filled 2022-01-19: qty 30

## 2022-01-19 MED ORDER — LIDOCAINE HCL (PF) 1 % IJ SOLN: 5.0000 mL | INTRAMUSCULAR | Status: DC | PRN

## 2022-01-19 MED ORDER — LIDOCAINE HCL 1 % IJ SOLN
INTRAMUSCULAR | Status: AC
  Administered 2022-01-19: 09:00:00 10 mL
  Filled 2022-01-19: qty 20

## 2022-01-19 MED ORDER — DARBEPOETIN ALFA 100 MCG/0.5ML IJ SOSY: 100.0000 ug | PREFILLED_SYRINGE | INTRAMUSCULAR | Status: DC

## 2022-01-19 NOTE — Consult Note (Addendum)
Chief Complaint: Patient was seen in consultation today for non tunneled vs tunneled dialysis catheter placement Chief Complaint  Patient presents with   Shortness of Breath   at the request of Dr Moshe Cipro   Supervising Physician: Corrie Mckusick  Patient Status: [name] - In-pt  History of Present Illness: Monique Cabrera is a 87 y.o. female   CHF; CKD; DM; HTN; R BKA Presents to ED 1/20 with CP and SOB Worsening SOB x few months Troponin over 6000 1/22 + cardiac event For cardiac cath today at 3 pm  CKD-- consulted with Nephrology Followed by [facility] as OP--- has been offered dialysis many time before - has always declined Palliative meeting held yesterday - they are moving ahead with cardiac cath today Want to also move ahead with dialysis catheter placement Renal prefers tunneled cath if we can--- temp if feel more appropriate Planned for probable dialysis initiation Wednesday   Past Medical History:  Diagnosis Date   Acute CVA (cerebrovascular accident) ([place]) 02/18/2017   Acute ischemic stroke ([place])    Acute metabolic encephalopathy 8/50/2774   Acute onset of severe vertigo 02/17/2017   Acute upper respiratory infections of unspecified site    Amputee 08/2019   Anemia    Anemia, unspecified    Arthritis    Atherosclerosis of native arteries of the extremities, unspecified    Chest pain, unspecified    Chronic kidney disease (CKD), stage II (mild)    [facility]   Diabetes mellitus without complication ([place])    Type II   Diarrhea    Disorder of bone and cartilage, unspecified    DNR (do not resuscitate)/DNI(Do Not Intubate) 04/08/2020        Gangrene of right foot ([place])    Herpes zoster with other nervous system complications(053.19)    History of blood transfusion    Hypercalcemia    Hypertension    Nonspecific reaction to tuberculin skin test without active tuberculosis(795.51)    Other and unspecified hyperlipidemia    PAD (peripheral  artery disease) ([place])    Per records from [facility]   Pain in joint, lower leg    Peripheral arterial disease ([place])    Postherpetic neuralgia    Proteinuria    Stroke ([place]) 01/2017   Type II or unspecified type diabetes mellitus with renal manifestations, uncontrolled(250.42)    Unspecified disorder of kidney and ureter     Past Surgical History:  Procedure Laterality Date   ABDOMINAL AORTOGRAM W/LOWER EXTREMITY N/A 10/31/2019   Procedure: ABDOMINAL AORTOGRAM W/LOWER EXTREMITY;  Surgeon: Wellington Hampshire, MD;  Location: [place] [facility];  Service: Cardiovascular;  Laterality: N/A;   ABDOMINAL AORTOGRAM W/LOWER EXTREMITY Left 05/12/2020   Procedure: ABDOMINAL AORTOGRAM W/LOWER EXTREMITY;  Surgeon: Waynetta Sandy, MD;  Location: [place] [facility];  Service: Cardiovascular;  Laterality: Left;   AMPUTATION Right 11/02/2019   Procedure: AMPUTATION BELOW KNEE RIGHT;  Surgeon: Rosetta Posner, MD;  Location: [facility] OR;  Service: Vascular;  Laterality: Right;   COLONOSCOPY     x 2   ENDARTERECTOMY FEMORAL Left 05/21/2020   Procedure: LEFT COMMON FEMORAL ENDARTERECTOMY;  Surgeon: Rosetta Posner, MD;  Location[facility] OR;  Service: Vascular;  Laterality: Left;   FEMORAL-POPLITEAL BYPASS GRAFT Left 05/21/2020   Procedure: LEFT FEMORAL TO BELOW KNEE POPLITEAL ARTERY BYPASS GRAFT;  Surgeon: Rosetta Posner, MD;  Location: [facility] OR;  Service: Vascular;  Laterality: Left;   hysterectomy     INCISION AND DRAINAGE Left  05/27/14   sebacous cyst, ear   PRP Left    Dr. Ricki Miller   removal of cyst from hand     removal of tumor from foot     TONSILLECTOMY      Allergies: Invokana [canagliflozin], Jardiance [empagliflozin], and Tuberculin ppd  Medications: Prior to Admission medications   Medication Sig Start Date End Date Taking? Authorizing Provider  acetaminophen (TYLENOL) 500 MG tablet Take 1,000 mg by mouth every 6 (six) hours as needed for moderate pain or headache.   Yes [provider]  acetaminophen (TYLENOL) 650 MG CR tablet Take 1,300 mg by mouth every 8 (eight) hours as needed for pain.   Yes [provider]  amLODipine (NORVASC) 10 MG tablet Take 1 tablet (10 mg total) by mouth daily. 10/19/21  Yes Wardell Honour, MD  ASPIRIN LOW DOSE 81 MG EC tablet Take 81 mg by mouth daily. 02/29/20  Yes [provider]  atorvastatin (LIPITOR) 40 MG tablet Take 1 tablet (40 mg total) by mouth daily. 06/10/21  Yes Wardell Honour, MD  calcitRIOL (ROCALTROL) 0.25 MCG capsule Take 0.25 mcg by mouth daily.   Yes [provider]  carvedilol (COREG) 25 MG tablet TAKE 1 TABLET BY MOUTH TWICE A DAY WITH MEALS Patient taking differently: Take 25 mg by mouth 2 (two) times daily with a meal. 10/19/21  Yes Wardell Honour, MD  cloNIDine (CATAPRES) 0.2 MG tablet TAKE 1 TABLET BY MOUTH 2 TIMES DAILY. Patient taking differently: Take 0.2 mg by mouth 2 (two) times daily. 12/04/21  Yes Wardell Honour, MD  clopidogrel (PLAVIX) 75 MG tablet Take 1 tablet (75 mg total) by mouth daily. 09/09/21  Yes Wardell Honour, MD  feeding supplement, GLUCERNA SHAKE, (GLUCERNA SHAKE) LIQD Take 237 mLs by mouth as needed (nutritional support). strawberry   Yes [provider]  glucose 5 g chewable tablet Chew 15 g by mouth as needed for low blood sugar.   Yes [provider]  insulin lispro (HUMALOG) 100 UNIT/ML injection Inject 0.03 mLs (3 Units total) into the skin 3 (three) times daily after meals. Patient taking differently: Inject 3 Units into the skin See admin instructions. 3 times daily with meals **Do not take if blood sugar is less 150** 04/03/20  Yes Reed, Tiffany L, DO  Insulin Pen Needle (B-D ULTRAFINE III SHORT PEN) 31G X 8 MM MISC Use to check blood sugar every day. Dx: 11.29; 11.65 07/04/20  Yes Reed, Tiffany L, DO  LANTUS SOLOSTAR 100 UNIT/ML Solostar Pen Inject 20 Units into the skin daily. 12/25/21  Yes Ngetich, [name] C, NP  Multiple Vitamin  (MULTIVITAMIN WITH MINERALS) TABS tablet Take 1 tablet by mouth daily.   Yes [provider]  olmesartan (BENICAR) 20 MG tablet Take 1 tablet (20 mg total) by mouth daily. 02/10/21  Yes Reed, Tiffany L, DO  sodium bicarbonate 650 MG tablet Take 650 mg by mouth 2 (two) times daily. 09/22/21  Yes [provider]  torsemide (DEMADEX) 20 MG tablet Take 1 tablet (20 mg total) by mouth daily. 12/30/21 01/29/22 Yes Ngetich, [name] C, NP  Glucose 15 GM/32ML GEL Use as needed for low blood sugars Patient not taking: Reported on 01/15/2022 01/08/21   Reed, Tiffany L, DO  glucose blood (ONETOUCH VERIO) test strip USE TO TEST BLOOD SUGAR THREE TIMES DAILY. DX: E11.9 Patient taking differently: daily after breakfast. 06/05/21   Wardell Honour, MD     Family History  Problem Relation  Age of Onset   Diabetes Mother    Diabetes Father    Diabetes Sister    Diabetes Sister     Social History   Socioeconomic History   Marital status: Widowed    Spouse name: Not on file   Number of children: 6   Years of education: 15   Highest education level: Not on file  Occupational History    Comment: retired, [employer]  Tobacco Use   Smoking status: Former    Types: Cigarettes   Smokeless tobacco: Never   Tobacco comments:    Quit about age 38  " it was really a habit"  Vaping Use   Vaping Use: Never used  Substance and Sexual Activity   Alcohol use: No    Alcohol/week: 0.0 standard drinks   Drug use: No   Sexual activity: Never  Other Topics Concern   Not on file  Social History Narrative   Has moved in with her daughter.   Caffeine- coffee 2-3 cups daily, soda off and on   Social Determinants of Health   Financial Resource Strain: Low Risk    Difficulty of Paying Living Expenses: Not hard at all  Food Insecurity: No Food Insecurity   Worried About Charity fundraiser in the Last Year: Never true   Ran Out of Food in the Last Year: Never true  Transportation Needs: No Transportation  Needs   Lack of Transportation (Medical): No   Lack of Transportation (Non-Medical): No  Physical Activity: Not on file  Stress: Not on file  Social Connections: Not on file    Review of Systems: A 12 point ROS discussed and pertinent positives are indicated in the HPI above.  All other systems are negative.    Vital Signs: BP (!) 164/92 (BP Location: Left Arm)    Pulse 95    Temp 97.6 F (36.4 C) (Oral)    Resp 16    Ht 5\' 5"  (1.651 m)    Wt 134 lb 14.7 oz (61.2 kg)    SpO2 99%    BMI 22.45 kg/m   Physical Exam Vitals reviewed.  Constitutional:      Comments: Sleepy   HENT:     Mouth/Throat:     Mouth: Mucous membranes are moist.  Cardiovascular:     Rate and Rhythm: Normal rate and regular rhythm.  Pulmonary:     Breath sounds: No wheezing.  Skin:    General: Skin is warm.  Neurological:     Comments: Able to tell me name and dob [place] asleep in converstion  Psychiatric:     Comments: Consented with Dtr Ann Held and son [name] via phone    Imaging: CT ABDOMEN PELVIS WO CONTRAST  Result Date: 01/17/2022 CLINICAL DATA:  Nausea and vomiting. EXAM: CT ABDOMEN AND PELVIS WITHOUT CONTRAST TECHNIQUE: Multidetector CT imaging of the abdomen and pelvis was performed following the standard protocol without IV contrast. RADIATION DOSE REDUCTION: This exam was performed according to the departmental dose-optimization program which includes automated exposure control, adjustment of the mA and/or kV according to patient size and/or use of iterative reconstruction technique. COMPARISON:  CT abdomen pelvis dated 08/21/2012. FINDINGS: Evaluation of this exam is limited in the absence of intravenous contrast as well as respiratory motion. Lower chest: Partially visualized moderate to large bilateral pleural effusions with large areas of consolidative changes of the lower lobes. Advanced 3 vessel coronary vascular calcification. No intra-abdominal free air.  Small free fluid in the pelvis.  Hepatobiliary:  Small right liver hypodense lesions are not characterized on this noncontrast CT. No intrahepatic biliary dilatation. There is layering sludge or small stones within the gallbladder. Pancreas: The pancreas is poorly visualized and suboptimally evaluated. Spleen: Normal in size without focal abnormality. Adrenals/Urinary Tract: The adrenal glands are grossly unremarkable. Moderate bilateral renal parenchyma atrophy. Indeterminate 2.5 cm hypodense lesion in the upper pole of the right kidney, likely a cyst. There is no hydronephrosis on either side. The urinary bladder is partially distended and grossly unremarkable. Stomach/Bowel: There is no bowel obstruction or active inflammation. No evidence of acute appendicitis. Vascular/Lymphatic: Advanced aortoiliac atherosclerotic disease as well as advanced mesenteric vascular calcification. The IVC is grossly unremarkable. No portal venous gas. There is no adenopathy. Reproductive: Hysterectomy. Other: There is diffuse subcutaneous edema and anasarca. Musculoskeletal: Osteopenia with degenerative changes of the spine. No acute osseous pathology. IMPRESSION: 1. No acute intra-abdominal or pelvic pathology. No bowel obstruction. 2. Cholelithiasis. 3. Partially visualized moderate to large bilateral pleural effusions with large areas of consolidative changes of the lower lobes. 4. Aortic Atherosclerosis (ICD10-I70.0). Electronically Signed   By: Anner Crete M.D.   On: 01/17/2022 22:14   DG Chest 2 View  Result Date: 01/15/2022 CLINICAL DATA:  Shortness of breath EXAM: CHEST - 2 VIEW COMPARISON:  12/30/2021 FINDINGS: Transverse diameter of heart is increased. There is interval increase in interstitial markings in the parahilar regions and lower lung fields. There is interval increase in bilateral pleural effusions. Evaluation of lower lung fields for infiltrates is limited by bilateral pleural effusions. There is no pneumothorax. IMPRESSION:  Cardiomegaly. Prominence of interstitial markings in the parahilar regions suggest mild interstitial edema. There is interval increase in small to moderate bilateral pleural effusions. Evaluation of lower lung fields for infiltrates is limited by pleural effusions. Electronically Signed   By: Elmer Picker M.D.   On: 01/15/2022 13:19   DG Chest 2 View  Result Date: 12/30/2021 CLINICAL DATA:  Cough and dyspnea with exertion. EXAM: CHEST - 2 VIEW COMPARISON:  Chest x-ray 09/11/2019. FINDINGS: There are small bilateral pleural effusions. There are patchy airspace opacities in both lung bases. The heart is enlarged. There central pulmonary vascular congestion. The aorta is tortuous with atherosclerotic calcifications. No acute fractures are seen. IMPRESSION: 1. Cardiomegaly with central pulmonary vascular congestion and small effusions. 2. Bibasilar atelectasis/airspace disease. Electronically Signed   By: Ronney Asters M.D.   On: 12/30/2021 18:00   DG Abd Portable 1V  Result Date: 01/16/2022 CLINICAL DATA:  Nausea and vomiting. EXAM: PORTABLE ABDOMEN - 1 VIEW COMPARISON:  02/09/2020 FINDINGS: 1459 hours. No gaseous bowel dilatation to suggest obstruction. Dense wall calcification noted in the splenic artery, abdominal aorta and iliac arteries bilaterally. IMPRESSION: 1. Nonobstructive bowel gas pattern. 2. Dense wall calcification in the splenic artery and iliac arteries. 3.  Aortic Atherosclerois (ICD10-170.0) Electronically Signed   By: Misty Stanley M.D.   On: 01/16/2022 15:18   ECHOCARDIOGRAM COMPLETE  Result Date: 01/17/2022    ECHOCARDIOGRAM REPORT   Patient Name:   Monique Cabrera Date of Exam: 01/17/2022 Medical Rec #:  [id number]        Height:       65.0 in Accession #:    [id number]       Weight:       134.7 lb Date of Birth:  [birth year]        BSA:          1.672 m Patient Age:    63 years  BP:           143/114 mmHg Patient Gender: F                HR:           97 bpm. Exam  Location:  Inpatient Procedure: 2D Echo, Cardiac Doppler, Color Doppler and 3D Echo Indications:    CHF-Acute Diastolic S97.02  History:        Patient has prior history of Echocardiogram examinations, most                 recent 02/09/2020. Stroke and PAD, Signs/Symptoms:Edema and                 Altered Mental Status; Risk Factors:Diabetes, Hypertension and                 Dyslipidemia.  Sonographer:    Merrie Roof RDCS Referring Phys: [place] Comments: Image acquisition challenging due to uncooperative patient. The patient woke up and sat straight up and would not lay back down to finish the echo protocol. IMPRESSIONS  1. Septal and apical hypokinesis EF has decreased since echo done 02/09/20 . Left ventricular ejection fraction, by estimation, is 40 to 45%. The left ventricle has mildly decreased function. The left ventricle has no regional wall motion abnormalities. The left ventricular internal cavity size was mildly dilated. There is mild asymmetric left ventricular hypertrophy of the basal and septal segments. Left ventricular diastolic parameters were normal.  2. Right ventricular systolic function is normal. The right ventricular size is normal.  3. Left atrial size was mildly dilated.  4. The mitral valve is abnormal. Trivial mitral valve regurgitation. No evidence of mitral stenosis.  5. The aortic valve is tricuspid. There is moderate calcification of the aortic valve. There is moderate thickening of the aortic valve. Aortic valve regurgitation is trivial. Aortic valve sclerosis/calcification is present, without any evidence of aortic stenosis.  6. The inferior vena cava is normal in size with greater than 50% respiratory variability, suggesting right atrial pressure of 3 mmHg. FINDINGS  Left Ventricle: Septal and apical hypokinesis EF has decreased since echo done 02/09/20. Left ventricular ejection fraction, by estimation, is 40 to 45%. The left ventricle has mildly decreased  function. The left ventricle has no regional wall motion abnormalities. The left ventricular internal cavity size was mildly dilated. There is mild asymmetric left ventricular hypertrophy of the basal and septal segments. Left ventricular diastolic parameters were normal. Right Ventricle: The right ventricular size is normal. No increase in right ventricular wall thickness. Right ventricular systolic function is normal. Left Atrium: Left atrial size was mildly dilated. Right Atrium: Right atrial size was normal in size. Pericardium: There is no evidence of pericardial effusion. Mitral Valve: The mitral valve is abnormal. There is mild thickening of the mitral valve leaflet(s). There is mild calcification of the mitral valve leaflet(s). Trivial mitral valve regurgitation. No evidence of mitral valve stenosis. Tricuspid Valve: The tricuspid valve is normal in structure. Tricuspid valve regurgitation is mild . No evidence of tricuspid stenosis. Aortic Valve: The aortic valve is tricuspid. There is moderate calcification of the aortic valve. There is moderate thickening of the aortic valve. Aortic valve regurgitation is trivial. Aortic valve sclerosis/calcification is present, without any evidence of aortic stenosis. Pulmonic Valve: The pulmonic valve was normal in structure. Pulmonic valve regurgitation is mild. No evidence of pulmonic stenosis. Aorta: The aortic root is normal in size and structure. Venous: The inferior vena  cava is normal in size with greater than 50% respiratory variability, suggesting right atrial pressure of 3 mmHg. IAS/Shunts: No atrial level shunt detected by color flow Doppler.  LEFT VENTRICLE PLAX 2D LVIDd:         3.70 cm LVIDs:         2.90 cm LV PW:         1.30 cm LV IVS:        1.20 cm LVOT diam:     1.80 cm     3D Volume EF: LVOT Area:     2.54 cm    3D EF:        43 %                            LV EDV:       134 ml                            LV ESV:       76 ml LV Volumes (MOD)            LV SV:        58 ml LV vol d, MOD A4C: 91.7 ml LV vol s, MOD A4C: 56.5 ml LV SV MOD A4C:     91.7 ml LEFT ATRIUM         Index LA diam:    4.10 cm 2.45 cm/m   AORTA Ao Root diam: 3.20 cm Ao Asc diam:  2.90 cm  SHUNTS Systemic Diam: 1.80 cm Jenkins Rouge MD Electronically signed by Jenkins Rouge MD Signature Date/Time: 01/17/2022/11:57:43 AM    Final     Labs:  CBC: Recent Labs    01/16/22 0222 01/17/22 0249 01/18/22 0344 01/19/22 0253  WBC 7.7 7.9 11.3* 10.3  HGB 9.0* 9.0* 8.7* 8.4*  HCT 28.3* 28.7* 26.5* 25.1*  PLT 210 192 204 196    COAGS: No results for input(s): INR, APTT in the last 8760 hours.  BMP: Recent Labs    04/06/21 1216 11/30/21 1404 01/16/22 0222 01/17/22 0249 01/18/22 0344 01/19/22 0253  NA 140   < > 137 138 135 133*  K 4.1   < > 4.3 5.1 4.7 4.3  CL 109   < > 106 106 105 103  CO2 21   < > 17* 15* 16* 16*  GLUCOSE 150*   < > 103* 202* 164* 137*  BUN 47*   < > 56* 67* 78* 71*  CALCIUM 8.9   < > 8.7* 9.1 8.9 8.9  CREATININE 3.66*   < > 7.59* 8.12* 8.45* 9.05*  GFRNONAA 11*   < > 5* 5* 4* 4*  GFRAA 13*  --   --   --   --   --    < > = values in this interval not displayed.    LIVER FUNCTION TESTS: Recent Labs    01/15/22 1631 01/16/22 0222 01/17/22 0249 01/18/22 0344 01/19/22 0253  BILITOT 1.0 0.7  --   --   --   AST 23 19  --   --   --   ALT 17 11  --   --   --   ALKPHOS 53 47  --   --   --   PROT 6.5 5.4*  --   --   --   ALBUMIN 3.0* 2.6* 2.7* 2.8* 2.8*    TUMOR MARKERS: No  results for input(s): AFPTM, CEA, CA199, CHROMGRNA in the last 8760 hours.  Assessment and Plan:  Scheduled today for tunneled vs temp dialysis cath in IR  Worsening renal failure-- Nephrology to initiate dialysis asap +cardiac event; high troponins Planned for Cardiac Cath today Risks and benefits discussed with the patient and family via phone, including, but not limited to bleeding, infection, vascular injury, pneumothorax which may require chest tube placement,  air embolism or even death  All of the patient's questions were answered, patient is agreeable to proceed. Consent signed and in chart.    Thank you for this interesting consult.  I greatly enjoyed meeting RAVENNA LEGORE and look forward to participating in their care.  A copy of this report was sent to the requesting provider on this date.  Electronically Signed: Lavonia Drafts, PA-C 01/19/2022, 7:02 AM   I spent a total of 20 Minutes    in face to face in clinical consultation, greater than 50% of which was counseling/coordinating care for tunneled vs non tunneled dialysis catheter placement

## 2022-01-19 NOTE — Progress Notes (Signed)
Progress note:  After reviewing the patient's chart and family goals of care discussions yesterday, goals are set and clear.  Plan is for HD and evaluation from cardiology.   Patient declared herself a full code not only for cardiac cath but for remainder of hospitalization.  Palliative medicine team will remain available to the medical team and patient throughout her hospitalization.  Please reach out to PMT for any palliative needs moving forward, if family/patient request, or if patient declines.  [place] Ilsa Iha, FNP-BC Palliative Medicine Team Team Phone # [phone]

## 2022-01-19 NOTE — Progress Notes (Signed)
Progress Note  Patient Name: Monique Cabrera Date of Encounter: 01/19/2022  [facility] Cardiologist: Kathlyn Sacramento, MD   Subjective   On interview, patient is alert and oriented x3. Denies any chest pain, SOB, dizziness, headache.  Inpatient Medications    Scheduled Meds:  aspirin EC  81 mg Oral Daily   clopidogrel  75 mg Oral Daily   heparin sodium (porcine)       hydrALAZINE  25 mg Oral Q8H   insulin aspart  0-6 Units Subcutaneous Q4H   lidocaine       metoCLOPramide (REGLAN) injection  5 mg Intravenous TID WC & HS   metoprolol tartrate  25 mg Oral Q6H   pantoprazole (PROTONIX) IV  40 mg Intravenous Q24H   sodium chloride flush  3 mL Intravenous Q12H   Continuous Infusions:  sodium chloride     sodium chloride      ceFAZolin (ANCEF) IV     heparin 650 Units/hr (01/19/22 0552)   nitroGLYCERIN 50 mcg/min (01/18/22 1845)   PRN Meds: sodium chloride, acetaminophen **OR** acetaminophen, fentaNYL (SUBLIMAZE) injection, metoprolol tartrate, ondansetron **OR** ondansetron (ZOFRAN) IV, ondansetron, promethazine (PHENERGAN) injection (IM or IVPB), sodium chloride flush   Vital Signs    Vitals:   01/18/22 1355 01/18/22 2002 01/18/22 2346 01/19/22 0413  BP: 135/84 (!) 159/93 (!) 164/92   Pulse:  93 90 95  Resp:      Temp:  97.6 F (36.4 C) 97.7 F (36.5 C) 97.6 F (36.4 C)  TempSrc:  Oral Oral Oral  SpO2:      Weight:    61.2 kg  Height:        Intake/Output Summary (Last 24 hours) at 01/19/2022 0829 Last data filed at 01/19/2022 9977 Gross per 24 hour  Intake 1014.25 ml  Output --  Net 1014.25 ml   Last 3 Weights 01/19/2022 01/18/2022 01/17/2022  Weight (lbs) 134 lb 14.7 oz 133 lb 13.1 oz 134 lb 7.7 oz  Weight (kg) 61.2 kg 60.7 kg 61 kg      Telemetry    Sinus rhythm, heart rate in the 80s-90s - Personally Reviewed  ECG    No new tracings - Personally Reviewed  Physical Exam   GEN: No acute distress. Sleeping in the bed, easily aroused.   Neck: No  JVD Cardiac: RRR, no murmurs, rubs, or gallops.  Respiratory: Anterior lung exam unlabored GI: Soft, nontender, non-distended  MS: No edema; Right BKA Neuro:  Nonfocal, Alert and oriented x3  Psych: Normal affect   Labs    High Sensitivity Troponin:   Recent Labs  Lab 01/15/22 1818 01/16/22 0457 01/16/22 0700 01/16/22 2148 01/17/22 0249  TROPONINIHS 244* 578* 449* 3,297* 6,368*     Chemistry Recent Labs  Lab 01/15/22 1631 01/16/22 0222 01/17/22 0249 01/18/22 0344 01/19/22 0253  NA 137 137 138 135 133*  K 4.2 4.3 5.1 4.7 4.3  CL 104 106 106 105 103  CO2 18* 17* 15* 16* 16*  GLUCOSE 115* 103* 202* 164* 137*  BUN 51* 56* 67* 78* 71*  CREATININE 7.50* 7.59* 8.12* 8.45* 9.05*  CALCIUM 9.3 8.7* 9.1 8.9 8.9  MG  --  1.9  --   --   --   PROT 6.5 5.4*  --   --   --   ALBUMIN 3.0* 2.6* 2.7* 2.8* 2.8*  AST 23 19  --   --   --   ALT 17 11  --   --   --  ALKPHOS 53 47  --   --   --   BILITOT 1.0 0.7  --   --   --   GFRNONAA 5* 5* 5* 4* 4*  ANIONGAP 15 14 17* 14 14    Lipids  Recent Labs  Lab 01/17/22 0249  CHOL 160  TRIG 82  HDL 61  LDLCALC 83  CHOLHDL 2.6    Hematology Recent Labs  Lab 01/17/22 0249 01/18/22 0344 01/19/22 0253  WBC 7.9 11.3* 10.3  RBC 2.94* 2.78* 2.69*  HGB 9.0* 8.7* 8.4*  HCT 28.7* 26.5* 25.1*  MCV 97.6 95.3 93.3  MCH 30.6 31.3 31.2  MCHC 31.4 32.8 33.5  RDW 13.9 13.9 13.8  PLT 192 204 196   Thyroid No results for input(s): TSH, FREET4 in the last 168 hours.  BNP Recent Labs  Lab 01/15/22 1818  BNP >4,500.0*    DDimer No results for input(s): DDIMER in the last 168 hours.   Radiology    CT ABDOMEN PELVIS WO CONTRAST  Result Date: 01/17/2022 CLINICAL DATA:  Nausea and vomiting. EXAM: CT ABDOMEN AND PELVIS WITHOUT CONTRAST TECHNIQUE: Multidetector CT imaging of the abdomen and pelvis was performed following the standard protocol without IV contrast. RADIATION DOSE REDUCTION: This exam was performed according to the departmental  dose-optimization program which includes automated exposure control, adjustment of the mA and/or kV according to patient size and/or use of iterative reconstruction technique. COMPARISON:  CT abdomen pelvis dated 08/21/2012. FINDINGS: Evaluation of this exam is limited in the absence of intravenous contrast as well as respiratory motion. Lower chest: Partially visualized moderate to large bilateral pleural effusions with large areas of consolidative changes of the lower lobes. Advanced 3 vessel coronary vascular calcification. No intra-abdominal free air.  Small free fluid in the pelvis. Hepatobiliary: Small right liver hypodense lesions are not characterized on this noncontrast CT. No intrahepatic biliary dilatation. There is layering sludge or small stones within the gallbladder. Pancreas: The pancreas is poorly visualized and suboptimally evaluated. Spleen: Normal in size without focal abnormality. Adrenals/Urinary Tract: The adrenal glands are grossly unremarkable. Moderate bilateral renal parenchyma atrophy. Indeterminate 2.5 cm hypodense lesion in the upper pole of the right kidney, likely a cyst. There is no hydronephrosis on either side. The urinary bladder is partially distended and grossly unremarkable. Stomach/Bowel: There is no bowel obstruction or active inflammation. No evidence of acute appendicitis. Vascular/Lymphatic: Advanced aortoiliac atherosclerotic disease as well as advanced mesenteric vascular calcification. The IVC is grossly unremarkable. No portal venous gas. There is no adenopathy. Reproductive: Hysterectomy. Other: There is diffuse subcutaneous edema and anasarca. Musculoskeletal: Osteopenia with degenerative changes of the spine. No acute osseous pathology. IMPRESSION: 1. No acute intra-abdominal or pelvic pathology. No bowel obstruction. 2. Cholelithiasis. 3. Partially visualized moderate to large bilateral pleural effusions with large areas of consolidative changes of the lower lobes.  4. Aortic Atherosclerosis (ICD10-I70.0). Electronically Signed   By: Anner Crete M.D.   On: 01/17/2022 22:14   ECHOCARDIOGRAM COMPLETE  Result Date: 01/17/2022    ECHOCARDIOGRAM REPORT   Patient Name:   Monique Cabrera Date of Exam: 01/17/2022 Medical Rec #:  [id number]        Height:       65.0 in Accession #:    [id number]       Weight:       134.7 lb Date of Birth:  Jun 11, 1935        BSA:          1.672 m Patient Age:  86 years         BP:           143/114 mmHg Patient Gender: F                HR:           97 bpm. Exam Location:  Inpatient Procedure: 2D Echo, Cardiac Doppler, Color Doppler and 3D Echo Indications:    CHF-Acute Diastolic M19.62  History:        Patient has prior history of Echocardiogram examinations, most                 recent 02/09/2020. Stroke and PAD, Signs/Symptoms:Edema and                 Altered Mental Status; Risk Factors:Diabetes, Hypertension and                 Dyslipidemia.  Sonographer:    Merrie Roof RDCS Referring Phys: [place] Comments: Image acquisition challenging due to uncooperative patient. The patient woke up and sat straight up and would not lay back down to finish the echo protocol. IMPRESSIONS  1. Septal and apical hypokinesis EF has decreased since echo done 02/09/20 . Left ventricular ejection fraction, by estimation, is 40 to 45%. The left ventricle has mildly decreased function. The left ventricle has no regional wall motion abnormalities. The left ventricular internal cavity size was mildly dilated. There is mild asymmetric left ventricular hypertrophy of the basal and septal segments. Left ventricular diastolic parameters were normal.  2. Right ventricular systolic function is normal. The right ventricular size is normal.  3. Left atrial size was mildly dilated.  4. The mitral valve is abnormal. Trivial mitral valve regurgitation. No evidence of mitral stenosis.  5. The aortic valve is tricuspid. There is moderate calcification of the  aortic valve. There is moderate thickening of the aortic valve. Aortic valve regurgitation is trivial. Aortic valve sclerosis/calcification is present, without any evidence of aortic stenosis.  6. The inferior vena cava is normal in size with greater than 50% respiratory variability, suggesting right atrial pressure of 3 mmHg. FINDINGS  Left Ventricle: Septal and apical hypokinesis EF has decreased since echo done 02/09/20. Left ventricular ejection fraction, by estimation, is 40 to 45%. The left ventricle has mildly decreased function. The left ventricle has no regional wall motion abnormalities. The left ventricular internal cavity size was mildly dilated. There is mild asymmetric left ventricular hypertrophy of the basal and septal segments. Left ventricular diastolic parameters were normal. Right Ventricle: The right ventricular size is normal. No increase in right ventricular wall thickness. Right ventricular systolic function is normal. Left Atrium: Left atrial size was mildly dilated. Right Atrium: Right atrial size was normal in size. Pericardium: There is no evidence of pericardial effusion. Mitral Valve: The mitral valve is abnormal. There is mild thickening of the mitral valve leaflet(s). There is mild calcification of the mitral valve leaflet(s). Trivial mitral valve regurgitation. No evidence of mitral valve stenosis. Tricuspid Valve: The tricuspid valve is normal in structure. Tricuspid valve regurgitation is mild . No evidence of tricuspid stenosis. Aortic Valve: The aortic valve is tricuspid. There is moderate calcification of the aortic valve. There is moderate thickening of the aortic valve. Aortic valve regurgitation is trivial. Aortic valve sclerosis/calcification is present, without any evidence of aortic stenosis. Pulmonic Valve: The pulmonic valve was normal in structure. Pulmonic valve regurgitation is mild. No evidence of pulmonic stenosis. Aorta: The aortic root is  normal in size and  structure. Venous: The inferior vena cava is normal in size with greater than 50% respiratory variability, suggesting right atrial pressure of 3 mmHg. IAS/Shunts: No atrial level shunt detected by color flow Doppler.  LEFT VENTRICLE PLAX 2D LVIDd:         3.70 cm LVIDs:         2.90 cm LV PW:         1.30 cm LV IVS:        1.20 cm LVOT diam:     1.80 cm     3D Volume EF: LVOT Area:     2.54 cm    3D EF:        43 %                            LV EDV:       134 ml                            LV ESV:       76 ml LV Volumes (MOD)           LV SV:        58 ml LV vol d, MOD A4C: 91.7 ml LV vol s, MOD A4C: 56.5 ml LV SV MOD A4C:     91.7 ml LEFT ATRIUM         Index LA diam:    4.10 cm 2.45 cm/m   AORTA Ao Root diam: 3.20 cm Ao Asc diam:  2.90 cm  SHUNTS Systemic Diam: 1.80 cm Jenkins Rouge MD Electronically signed by Jenkins Rouge MD Signature Date/Time: 01/17/2022/11:57:43 AM    Final     Cardiac Studies   TTE 01/17/2022 1. Septal and apical hypokinesis EF has decreased since echo done 02/09/20  . Left ventricular ejection fraction, by estimation, is 40 to 45%. The  left ventricle has mildly decreased function. The left ventricle has no  regional wall motion abnormalities.  The left ventricular internal cavity size was mildly dilated. There is  mild asymmetric left ventricular hypertrophy of the basal and septal  segments. Left ventricular diastolic parameters were normal.   2. Right ventricular systolic function is normal. The right ventricular  size is normal.   3. Left atrial size was mildly dilated.   4. The mitral valve is abnormal. Trivial mitral valve regurgitation. No  evidence of mitral stenosis.   5. The aortic valve is tricuspid. There is moderate calcification of the  aortic valve. There is moderate thickening of the aortic valve. Aortic  valve regurgitation is trivial. Aortic valve sclerosis/calcification is  present, without any evidence of  aortic stenosis.   6. The inferior vena cava is  normal in size with greater than 50%  respiratory variability, suggesting right atrial pressure of 3 mmHg  Patient Profile     87 y.o. female history CRF, PVD post RLE amputation HTN and CVA admitted with dyspnea and chest pain with evolving SEMI and mildly reduced EF 40-45% with LAD territory hypokinesis that's new since 02/09/2020, in sinus rhythm  Assessment & Plan    Myocardial Infarction: Patient has evolved an MI, ECG without acute ST elevation  - HSTN peaked at 6368 on 1/22  - Echo on 1/22 showed EF 40-45% (decreased from 60-65%), LAD territory hypokinesis (new when compared to previous echo)  - Originally, as patient has advanced renal failure,  is of advanced age, and was experiencing delirium while inpatient, we planned to manage her MI medically and withhold invasive procedures. However, patient and family met with palliative care yesterday. Patient's mentation improved and she was able to express her desires for HD and cardiac interventions. Dr. Harl Bowie discussed LHC with the patient and she consented to the procedure. See Dr. Harl Bowie note 1/23 for consent.  - Started on heparin drip on 1/22. Started on nitro drip on 1/20.  - Continue DAPT with ASA, plavix  - Continue BB, restart home statin  - Planned for LHC today with IHD afterward (per nephrology). Patient has been NPO since midnight   HTN crisis  - Presented with SBP in the 200s, slowly improving, current SBP in 160s  - On nitro drip, hydralazine 68m TID, metoprolol tartrate 25 mg Q6hrs - Currently holding home amlodipine, clonidine, benicar.  - Will plan to adjust BP meds following LHC. Ideally, patient will be able to tolerate daily/BID medications rather than medications that are given 3-4 times daily as this will help with compliance.    CKD stage V  - Managed per nephrology, plans for IHD following LHC  -Temp HD cath placement per ID  - Continue to hold benicar, possibly able to restart if kidney function allows but will  likely discontinue  For questions or updates, please contact [facility] Please consult [email or web address] for contact info under        Signed, KMargie Billet PA-C  01/19/2022, 8:29 AM

## 2022-01-19 NOTE — Progress Notes (Signed)
Notified by nurse that patient had removed her non tunneled dialysis catheter that was placed this morning. Patient reports that it felt "too tight" around her neck and didn't think that the whole thing would come out when she adjusted it. Patient denies any chest pain, trouble breathing. Is alert and oriented x3, able to follow directions. Was planned for cardiac cath today, but given decreased kidney function (creatinine 9.05, BUN 71, eGFR 4) hesitant to proceed without HD access.  Informed Dr. Sallyanne Kuster with Cardiology, Dr. Moshe Cipro with Nephrology. Will rescheduled cardiac cath until tomorrow. Per IR note, they plan to replace dialysis catheter in AM. Discussed plan with daughter, granddaughter who were at bedside.

## 2022-01-19 NOTE — Progress Notes (Signed)
Subjective:  Appreciate IR-  busy day today planned for heart cath, then temp HD cath and possible dialysis -  she already has the temp HD cath in place-  somnolent but arousable-  no questions  Objective Vital signs in last 24 hours: Vitals:   01/18/22 1355 01/18/22 2002 01/18/22 2346 01/19/22 0413  BP: 135/84 (!) 159/93 (!) 164/92   Pulse:  93 90 95  Resp:      Temp:  97.6 F (36.4 C) 97.7 F (36.5 C) 97.6 F (36.4 C)  TempSrc:  Oral Oral Oral  SpO2:      Weight:    61.2 kg  Height:       Weight change: 0.5 kg  Intake/Output Summary (Last 24 hours) at 01/19/2022 1018 Last data filed at 01/19/2022 1610 Gross per 24 hour  Intake 1014.25 ml  Output --  Net 1014.25 ml    Assessment/ Plan: Pt is a 87 y.o. yo female with known advanced CKD who was admitted on 01/15/2022 with acute coronary event  Assessment/Plan: 1. Acute coronary event-  now on nitro and heparin-  attempting to medically manage-  concern about doing heart cath given current state and advanced CKD-  now plans have changed and heart cath is planned for today  2. Renal-  apparently had been asked several times as OP if she wanted dialysis and she always declined even when faced with decision that she could die without HD -  this is documented in the [facility] record.  Palliative care meeting took place yesterday and decision was made to go ahead and pursue HD wanting to do what we could to prolong life.  Now has temp HD cath in place-  will try for first HD after heart cath if able -  if not tonight then will do tomorrow    3. Anemia-  hgb dropping on heparin -  will check iron stores and add on esa   4. HTN/volume-  hypertensive-  volume seems OK-  on nitro/hydralazine/lopressor -  volume does not seem to be a major player at this time  5. Bones- will start phoslo and check PTH      Louis Meckel    Labs: Basic Metabolic Panel: Recent Labs  Lab 01/17/22 0249 01/18/22 0344 01/19/22 0253  NA 138 135 133*   K 5.1 4.7 4.3  CL 106 105 103  CO2 15* 16* 16*  GLUCOSE 202* 164* 137*  BUN 67* 78* 71*  CREATININE 8.12* 8.45* 9.05*  CALCIUM 9.1 8.9 8.9  PHOS 9.8* 9.0* 8.4*   Liver Function Tests: Recent Labs  Lab 01/15/22 1631 01/16/22 0222 01/17/22 0249 01/18/22 0344 01/19/22 0253  AST 23 19  --   --   --   ALT 17 11  --   --   --   ALKPHOS 53 47  --   --   --   BILITOT 1.0 0.7  --   --   --   PROT 6.5 5.4*  --   --   --   ALBUMIN 3.0* 2.6* 2.7* 2.8* 2.8*   Recent Labs  Lab 01/17/22 1105  LIPASE 23   No results for input(s): AMMONIA in the last 168 hours. CBC: Recent Labs  Lab 01/15/22 1631 01/16/22 0222 01/17/22 0249 01/18/22 0344 01/19/22 0253  WBC 8.7 7.7 7.9 11.3* 10.3  NEUTROABS 6.5 5.6 5.6 9.0* 8.1*  HGB 11.0* 9.0* 9.0* 8.7* 8.4*  HCT 33.7* 28.3* 28.7* 26.5* 25.1*  MCV 98.3 96.3  97.6 95.3 93.3  PLT 229 210 192 204 196   Cardiac Enzymes: No results for input(s): CKTOTAL, CKMB, CKMBINDEX, TROPONINI in the last 168 hours. CBG: Recent Labs  Lab 01/18/22 1621 01/18/22 2001 01/18/22 2344 01/19/22 0411 01/19/22 0923  GLUCAP 159* 141* 144* 137* 161*    Iron Studies: No results for input(s): IRON, TIBC, TRANSFERRIN, FERRITIN in the last 72 hours. Studies/Results: CT ABDOMEN PELVIS WO CONTRAST  Result Date: 01/17/2022 CLINICAL DATA:  Nausea and vomiting. EXAM: CT ABDOMEN AND PELVIS WITHOUT CONTRAST TECHNIQUE: Multidetector CT imaging of the abdomen and pelvis was performed following the standard protocol without IV contrast. RADIATION DOSE REDUCTION: This exam was performed according to the departmental dose-optimization program which includes automated exposure control, adjustment of the mA and/or kV according to patient size and/or use of iterative reconstruction technique. COMPARISON:  CT abdomen pelvis dated 08/21/2012. FINDINGS: Evaluation of this exam is limited in the absence of intravenous contrast as well as respiratory motion. Lower chest: Partially  visualized moderate to large bilateral pleural effusions with large areas of consolidative changes of the lower lobes. Advanced 3 vessel coronary vascular calcification. No intra-abdominal free air.  Small free fluid in the pelvis. Hepatobiliary: Small right liver hypodense lesions are not characterized on this noncontrast CT. No intrahepatic biliary dilatation. There is layering sludge or small stones within the gallbladder. Pancreas: The pancreas is poorly visualized and suboptimally evaluated. Spleen: Normal in size without focal abnormality. Adrenals/Urinary Tract: The adrenal glands are grossly unremarkable. Moderate bilateral renal parenchyma atrophy. Indeterminate 2.5 cm hypodense lesion in the upper pole of the right kidney, likely a cyst. There is no hydronephrosis on either side. The urinary bladder is partially distended and grossly unremarkable. Stomach/Bowel: There is no bowel obstruction or active inflammation. No evidence of acute appendicitis. Vascular/Lymphatic: Advanced aortoiliac atherosclerotic disease as well as advanced mesenteric vascular calcification. The IVC is grossly unremarkable. No portal venous gas. There is no adenopathy. Reproductive: Hysterectomy. Other: There is diffuse subcutaneous edema and anasarca. Musculoskeletal: Osteopenia with degenerative changes of the spine. No acute osseous pathology. IMPRESSION: 1. No acute intra-abdominal or pelvic pathology. No bowel obstruction. 2. Cholelithiasis. 3. Partially visualized moderate to large bilateral pleural effusions with large areas of consolidative changes of the lower lobes. 4. Aortic Atherosclerosis (ICD10-I70.0). Electronically Signed   By: Anner Crete M.D.   On: 01/17/2022 22:14   IR Fluoro Guide CV Line Right  Result Date: 01/19/2022 INDICATION: 87 year old female referred for hemodialysis catheter EXAM: IMAGE GUIDED TEMPORARY HEMODIALYSIS CATHETER PLACEMENT MEDICATIONS: None ANESTHESIA/SEDATION: Moderate (conscious)  sedation was not employed during this procedure. Total intra-service moderate Sedation Time: 0 minutes. The patient's level of consciousness and vital signs were monitored continuously by radiology nursing throughout the procedure under my direct supervision. FLUOROSCOPY TIME:  Fluoroscopy Time: 0 minutes 42 seconds (1 mGy). COMPLICATIONS: None PROCEDURE: Informed written consent was obtained from the patient and the patient's family after a thorough discussion of the procedural risks, benefits and alternatives. All questions were addressed. A timeout was performed prior to the initiation of the procedure. The right neck and chest was prepped with chlorhexidine, and draped in the usual sterile fashion using maximum barrier technique (cap and mask, sterile gown, sterile gloves, large sterile sheet, hand hygiene and cutaneous antiseptic). Local anesthesia was attained by infiltration with 1% lidocaine without epinephrine. Ultrasound demonstrated patency of the right internal jugular vein, and this was documented with an image. Under real-time ultrasound guidance, this vein was accessed with a 21 gauge micropuncture needle and  image documentation was performed. A small dermatotomy was made at the access site with an 11 scalpel. A 0.018" wire was advanced into the SVC and the access needle exchanged for a 36F micropuncture vascular sheath. The 0.018" wire was then removed and a 0.035" wire advanced into the IVC. Upon withdrawal of the 018 wire, the wire was marked for appropriate length of the internal portion of the catheter. A 15 cm catheter was selected. Skin and subcutaneous tissues were serially dilated. Catheter was placed on the wire. The catheter tip is positioned in the upper right atrium. This was documented with a spot image. Both ports of the hemodialysis catheter were then tested for excellent function. The ports were then locked with heparinized lock. Patient tolerated the procedure well and remained  hemodynamically stable throughout. No complications were encountered and no significant blood loss was encountered. IMPRESSION: Status post right IJ temporary hemodialysis catheter placement. Signed, Dulcy Fanny. Dellia Nims, RPVI [facility] Radiology Electronically Signed   By: Corrie Mckusick D.O.   On: 01/19/2022 10:08   IR US Guide Vasc Access Right  Result Date: 01/19/2022 INDICATION: 87 year old female referred for hemodialysis catheter EXAM: IMAGE GUIDED TEMPORARY HEMODIALYSIS CATHETER PLACEMENT MEDICATIONS: None ANESTHESIA/SEDATION: Moderate (conscious) sedation was not employed during this procedure. Total intra-service moderate Sedation Time: 0 minutes. The patient's level of consciousness and vital signs were monitored continuously by radiology nursing throughout the procedure under my direct supervision. FLUOROSCOPY TIME:  Fluoroscopy Time: 0 minutes 42 seconds (1 mGy). COMPLICATIONS: None PROCEDURE: Informed written consent was obtained from the patient and the patient's family after a thorough discussion of the procedural risks, benefits and alternatives. All questions were addressed. A timeout was performed prior to the initiation of the procedure. The right neck and chest was prepped with chlorhexidine, and draped in the usual sterile fashion using maximum barrier technique (cap and mask, sterile gown, sterile gloves, large sterile sheet, hand hygiene and cutaneous antiseptic). Local anesthesia was attained by infiltration with 1% lidocaine without epinephrine. Ultrasound demonstrated patency of the right internal jugular vein, and this was documented with an image. Under real-time ultrasound guidance, this vein was accessed with a 21 gauge micropuncture needle and image documentation was performed. A small dermatotomy was made at the access site with an 11 scalpel. A 0.018" wire was advanced into the SVC and the access needle exchanged for a 36F  micropuncture vascular sheath. The 0.018" wire was then removed and a 0.035" wire advanced into the IVC. Upon withdrawal of the 018 wire, the wire was marked for appropriate length of the internal portion of the catheter. A 15 cm catheter was selected. Skin and subcutaneous tissues were serially dilated. Catheter was placed on the wire. The catheter tip is positioned in the upper right atrium. This was documented with a spot image. Both ports of the hemodialysis catheter were then tested for excellent function. The ports were then locked with heparinized lock. Patient tolerated the procedure well and remained hemodynamically stable throughout. No complications were encountered and no significant blood loss was encountered. IMPRESSION: Status post right IJ temporary hemodialysis catheter placement. Signed, Dulcy Fanny. Dellia Nims, RPVI [name] Radiology Electronically Signed   By: Corrie Mckusick D.O.   On: 01/19/2022 10:08   Medications: Infusions:  sodium chloride     sodium chloride      ceFAZolin (ANCEF) IV     heparin 650 Units/hr (01/19/22 0552)   nitroGLYCERIN 50 mcg/min (01/18/22 1845)  Scheduled Medications:  aspirin EC  81 mg Oral Daily   Chlorhexidine Gluconate Cloth  6 each Topical Daily   clopidogrel  75 mg Oral Daily   heparin sodium (porcine)       hydrALAZINE  25 mg Oral Q8H   insulin aspart  0-6 Units Subcutaneous Q4H   metoCLOPramide (REGLAN) injection  5 mg Intravenous TID WC & HS   metoprolol tartrate  25 mg Oral Q6H   pantoprazole (PROTONIX) IV  40 mg Intravenous Q24H   sodium chloride flush  3 mL Intravenous Q12H    have reviewed scheduled and prn medications.  Physical Exam: General: thin, sleeping Heart: tachy  Lungs: mostly clear Abdomen: soft, non tender Extremities: no significant peripheral edema  New right sided temp HD cath placed 1/24     01/19/2022,10:18 AM  LOS: 4 days

## 2022-01-19 NOTE — Progress Notes (Signed)
PROGRESS NOTE    Monique Cabrera  WGN:[id number] DOB: 07/22/1935 DOA: 01/15/2022 PCP: Wardell Honour, MD    Brief Narrative:  87 yo female with hx of diastolic CHF, CKD stage 5(followed by [facility]), type 2 DM, HTN, s/p right BKA, presented to ER with CP and worsening SOB X 1 day. Patient states that she has had shortness of breath for well over 3 months, which has been progressively worsening. Reported some chest pain, when she was short of breath. She denies any lower extremity edema. In the ER, pt noted to hypertensive with SBP >200. Pt with difficult IV access. Took several hours to establish PIV. Started on IV ntg gtts. Labs showed troponin 191 --> 244. Cardiology consulted. Scr 7.5 BUN 51. Bicarb 18, BNP >4500. Due to malignant HTN, elevated troponin, CXR consistent with CHF, [facility] hospitalist consulted for admission.    Assessment & Plan:   Essential hypertension with hypertensive crisis: Presented with blood pressure more than 200, gradually controlling.  Currently remains on nitroglycerin drip along with metoprolol and hydralazine.  Blood pressure acceptable today.  Chest pain, elevated troponin with evidence of non-STEMI: Troponins gradually uptrending and peak at 6368.  EKG with no acute ST changes.  Echocardiogram with reduced ejection fraction.  Seen by cardiology, since patient agreeable for hemodialysis, scheduled for cardiac cath today.  Remains on aspirin and Plavix.  Already on nitroglycerin drip and also on heparin.  Acute systolic heart failure: Probably due to above.  Will need fluid management with dialysis.  CKD stage V, intractable nausea vomiting with likely uremic symptoms: See goal of care discussion below.  Patient decided to go for hemodialysis. 1/24, temporary dialysis catheter was placed by IR and patient pulled it out.  Next planned for tomorrow morning.  Anemia of chronic disease: Hemoglobin gradually drifting down.  Checking iron stores.  Type 2  diabetes on home insulin, well controlled: Currently on insulin and is stable.  Goal of care discussion: Detailed goal of care discussion with previous attending, nephrology and palliative care medicine with family. Patient decided to undergo cardiac cath today and also hemodialysis. Remains poor prognosis.  We will continue to follow.  Palliative will continue to follow.    DVT prophylaxis: SCDs Start: 01/15/22 2041   Code Status: Full code Family Communication: None at the bedside.  Cardiology communicating. Disposition Plan: Status is: Inpatient  Remains inpatient appropriate because: Cardiac cath planned.  Hemodialysis planned.         Consultants:  Cardiology Nephrology Palliative  Procedures:  None  Antimicrobials:  None   Subjective: Examined patient in the morning rounds.  She was sleepy and did not participate in conversation. Came back to check on the patient in the afternoon, she is still sleepy, not interested to talk.  Accidentally pulled out to her temporary dialysis catheter from right neck.  Objective: Vitals:   01/19/22 0413 01/19/22 1142 01/19/22 1257 01/19/22 1323  BP:  (!) 168/100 (!) 166/92   Pulse: 95  94   Resp:  (!) 22  (!) 24  Temp: 97.6 F (36.4 C) (!) 97.4 F (36.3 C)    TempSrc: Oral Oral    SpO2:      Weight: 61.2 kg     Height:        Intake/Output Summary (Last 24 hours) at 01/19/2022 1521 Last data filed at 01/19/2022 0552 Gross per 24 hour  Intake 1014.25 ml  Output --  Net 1014.25 ml   [employer]   01/17/22  7035 01/18/22 0446 01/19/22 0413  Weight: 61 kg 60.7 kg 61.2 kg    Examination:  General: Sick looking.  Not in any distress.  Anxious.  Occasionally restless. Cardiovascular: S1-S2 normal.  Regular rate rhythm. Respiratory: Bilateral basilar crackles.  On room air. Gastrointestinal: Soft.  Nontender.  Bowel sound present. Ext: No deformities.  No edema or cyanosis. Neuro: Alert on stimulation but mostly  sleepy and lethargic.      Data Reviewed: I have personally reviewed following labs and imaging studies  CBC: Recent Labs  Lab 01/15/22 1631 01/16/22 0222 01/17/22 0249 01/18/22 0344 01/19/22 0253  WBC 8.7 7.7 7.9 11.3* 10.3  NEUTROABS 6.5 5.6 5.6 9.0* 8.1*  HGB 11.0* 9.0* 9.0* 8.7* 8.4*  HCT 33.7* 28.3* 28.7* 26.5* 25.1*  MCV 98.3 96.3 97.6 95.3 93.3  PLT 229 210 192 204 009   Basic Metabolic Panel: Recent Labs  Lab 01/15/22 1631 01/16/22 0222 01/17/22 0249 01/18/22 0344 01/19/22 0253  NA 137 137 138 135 133*  K 4.2 4.3 5.1 4.7 4.3  CL 104 106 106 105 103  CO2 18* 17* 15* 16* 16*  GLUCOSE 115* 103* 202* 164* 137*  BUN 51* 56* 67* 78* 71*  CREATININE 7.50* 7.59* 8.12* 8.45* 9.05*  CALCIUM 9.3 8.7* 9.1 8.9 8.9  MG  --  1.9  --   --   --   PHOS  --   --  9.8* 9.0* 8.4*   GFR: Estimated Creatinine Clearance: 4.2 mL/min (A) (by C-G formula based on SCr of 9.05 mg/dL (H)). Liver Function Tests: Recent Labs  Lab 01/15/22 1631 01/16/22 0222 01/17/22 0249 01/18/22 0344 01/19/22 0253  AST 23 19  --   --   --   ALT 17 11  --   --   --   ALKPHOS 53 47  --   --   --   BILITOT 1.0 0.7  --   --   --   PROT 6.5 5.4*  --   --   --   ALBUMIN 3.0* 2.6* 2.7* 2.8* 2.8*   Recent Labs  Lab 01/17/22 1105  LIPASE 23   No results for input(s): AMMONIA in the last 168 hours. Coagulation Profile: No results for input(s): INR, PROTIME in the last 168 hours. Cardiac Enzymes: No results for input(s): CKTOTAL, CKMB, CKMBINDEX, TROPONINI in the last 168 hours. BNP (last 3 results) No results for input(s): PROBNP in the last 8760 hours. HbA1C: No results for input(s): HGBA1C in the last 72 hours. CBG: Recent Labs  Lab 01/18/22 2001 01/18/22 2344 01/19/22 0411 01/19/22 0923 01/19/22 1140  GLUCAP 141* 144* 137* 161* 146*   Lipid Profile: Recent Labs    01/17/22 0249  CHOL 160  HDL 61  LDLCALC 83  TRIG 82  CHOLHDL 2.6   Thyroid Function Tests: No results for  input(s): TSH, T4TOTAL, FREET4, T3FREE, THYROIDAB in the last 72 hours. Anemia Panel: No results for input(s): VITAMINB12, FOLATE, FERRITIN, TIBC, IRON, RETICCTPCT in the last 72 hours. Sepsis Labs: No results for input(s): PROCALCITON, LATICACIDVEN in the last 168 hours.  Recent Results (from the past 240 hour(s))  Resp Panel by RT-PCR (Flu A&B, Covid) Nasopharyngeal Swab     Status: None   Collection Time: 01/15/22  1:36 PM   Specimen: Nasopharyngeal Swab; Nasopharyngeal(NP) swabs in vial transport medium  Result Value Ref Range Status   SARS Coronavirus 2 by RT PCR NEGATIVE NEGATIVE Final    Comment: (NOTE) SARS-CoV-2 target nucleic acids are NOT  DETECTED.  The SARS-CoV-2 RNA is generally detectable in upper respiratory specimens during the acute phase of infection. The lowest concentration of SARS-CoV-2 viral copies this assay can detect is 138 copies/mL. A negative result does not preclude SARS-Cov-2 infection and should not be used as the sole basis for treatment or other patient management decisions. A negative result may occur with  improper specimen collection/handling, submission of specimen other than nasopharyngeal swab, presence of viral mutation(s) within the areas targeted by this assay, and inadequate number of viral copies(<138 copies/mL). A negative result must be combined with clinical observations, patient history, and epidemiological information. The expected result is Negative.  Fact Sheet for Patients:  EntrepreneurPulse.com.au  Fact Sheet for Healthcare Providers:  IncredibleEmployment.be  This test is no t yet approved or cleared by the [place] FDA and  has been authorized for detection and/or diagnosis of SARS-CoV-2 by FDA under an Emergency Use Authorization (EUA). This EUA will remain  in effect (meaning this test can be used) for the duration of the COVID-19 declaration under Section 564(b)(1) of the Act,  21 U.S.C.section 360bbb-3(b)(1), unless the authorization is terminated  or revoked sooner.       Influenza A by PCR NEGATIVE NEGATIVE Final   Influenza B by PCR NEGATIVE NEGATIVE Final    Comment: (NOTE) The Xpert Xpress SARS-CoV-2/FLU/RSV plus assay is intended as an aid in the diagnosis of influenza from Nasopharyngeal swab specimens and should not be used as a sole basis for treatment. Nasal washings and aspirates are unacceptable for Xpert Xpress SARS-CoV-2/FLU/RSV testing.  Fact Sheet for Patients: EntrepreneurPulse.com.au  Fact Sheet for Healthcare Providers: IncredibleEmployment.be  This test is not yet approved or cleared by the [place] FDA and has been authorized for detection and/or diagnosis of SARS-CoV-2 by FDA under an Emergency Use Authorization (EUA). This EUA will remain in effect (meaning this test can be used) for the duration of the COVID-19 declaration under Section 564(b)(1) of the Act, 21 U.S.C. section 360bbb-3(b)(1), unless the authorization is terminated or revoked.  Performed at [facility], [place] [address], [place], [place] [address]          Radiology Studies: CT ABDOMEN PELVIS WO CONTRAST  Result Date: 01/17/2022 CLINICAL DATA:  Nausea and vomiting. EXAM: CT ABDOMEN AND PELVIS WITHOUT CONTRAST TECHNIQUE: Multidetector CT imaging of the abdomen and pelvis was performed following the standard protocol without IV contrast. RADIATION DOSE REDUCTION: This exam was performed according to the departmental dose-optimization program which includes automated exposure control, adjustment of the mA and/or kV according to patient size and/or use of iterative reconstruction technique. COMPARISON:  CT abdomen pelvis dated 08/21/2012. FINDINGS: Evaluation of this exam is limited in the absence of intravenous contrast as well as respiratory motion. Lower chest: Partially visualized moderate to large bilateral pleural  effusions with large areas of consolidative changes of the lower lobes. Advanced 3 vessel coronary vascular calcification. No intra-abdominal free air.  Small free fluid in the pelvis. Hepatobiliary: Small right liver hypodense lesions are not characterized on this noncontrast CT. No intrahepatic biliary dilatation. There is layering sludge or small stones within the gallbladder. Pancreas: The pancreas is poorly visualized and suboptimally evaluated. Spleen: Normal in size without focal abnormality. Adrenals/Urinary Tract: The adrenal glands are grossly unremarkable. Moderate bilateral renal parenchyma atrophy. Indeterminate 2.5 cm hypodense lesion in the upper pole of the right kidney, likely a cyst. There is no hydronephrosis on either side. The urinary bladder is partially distended and grossly unremarkable. Stomach/Bowel: There  is no bowel obstruction or active inflammation. No evidence of acute appendicitis. Vascular/Lymphatic: Advanced aortoiliac atherosclerotic disease as well as advanced mesenteric vascular calcification. The IVC is grossly unremarkable. No portal venous gas. There is no adenopathy. Reproductive: Hysterectomy. Other: There is diffuse subcutaneous edema and anasarca. Musculoskeletal: Osteopenia with degenerative changes of the spine. No acute osseous pathology. IMPRESSION: 1. No acute intra-abdominal or pelvic pathology. No bowel obstruction. 2. Cholelithiasis. 3. Partially visualized moderate to large bilateral pleural effusions with large areas of consolidative changes of the lower lobes. 4. Aortic Atherosclerosis (ICD10-I70.0). Electronically Signed   By: Anner Crete M.D.   On: 01/17/2022 22:14   IR Fluoro Guide CV Line Right  Result Date: 01/19/2022 INDICATION: 87 year old female referred for hemodialysis catheter EXAM: IMAGE GUIDED TEMPORARY HEMODIALYSIS CATHETER PLACEMENT MEDICATIONS: None ANESTHESIA/SEDATION: Moderate (conscious) sedation was not employed during this  procedure. Total intra-service moderate Sedation Time: 0 minutes. The patient's level of consciousness and vital signs were monitored continuously by radiology nursing throughout the procedure under my direct supervision. FLUOROSCOPY TIME:  Fluoroscopy Time: 0 minutes 42 seconds (1 mGy). COMPLICATIONS: None PROCEDURE: Informed written consent was obtained from the patient and the patient's family after a thorough discussion of the procedural risks, benefits and alternatives. All questions were addressed. A timeout was performed prior to the initiation of the procedure. The right neck and chest was prepped with chlorhexidine, and draped in the usual sterile fashion using maximum barrier technique (cap and mask, sterile gown, sterile gloves, large sterile sheet, hand hygiene and cutaneous antiseptic). Local anesthesia was attained by infiltration with 1% lidocaine without epinephrine. Ultrasound demonstrated patency of the right internal jugular vein, and this was documented with an image. Under real-time ultrasound guidance, this vein was accessed with a 21 gauge micropuncture needle and image documentation was performed. A small dermatotomy was made at the access site with an 11 scalpel. A 0.018" wire was advanced into the SVC and the access needle exchanged for a 82F micropuncture vascular sheath. The 0.018" wire was then removed and a 0.035" wire advanced into the IVC. Upon withdrawal of the 018 wire, the wire was marked for appropriate length of the internal portion of the catheter. A 15 cm catheter was selected. Skin and subcutaneous tissues were serially dilated. Catheter was placed on the wire. The catheter tip is positioned in the upper right atrium. This was documented with a spot image. Both ports of the hemodialysis catheter were then tested for excellent function. The ports were then locked with heparinized lock. Patient tolerated the procedure well and remained hemodynamically stable throughout. No  complications were encountered and no significant blood loss was encountered. IMPRESSION: Status post right IJ temporary hemodialysis catheter placement. Signed, Dulcy Fanny. Dellia Nims, RPVI [facility] Electronically Signed   By: Corrie Mckusick D.O.   On: 01/19/2022 10:08   IR US Guide Vasc Access Right  Result Date: 01/19/2022 INDICATION: 87 year old female referred for hemodialysis catheter EXAM: IMAGE GUIDED TEMPORARY HEMODIALYSIS CATHETER PLACEMENT MEDICATIONS: None ANESTHESIA/SEDATION: Moderate (conscious) sedation was not employed during this procedure. Total intra-service moderate Sedation Time: 0 minutes. The patient's level of consciousness and vital signs were monitored continuously by radiology nursing throughout the procedure under my direct supervision. FLUOROSCOPY TIME:  Fluoroscopy Time: 0 minutes 42 seconds (1 mGy). COMPLICATIONS: None PROCEDURE: Informed written consent was obtained from the patient and the patient's family after a thorough discussion of the procedural risks, benefits and alternatives. All questions were addressed. A timeout was performed  prior to the initiation of the procedure. The right neck and chest was prepped with chlorhexidine, and draped in the usual sterile fashion using maximum barrier technique (cap and mask, sterile gown, sterile gloves, large sterile sheet, hand hygiene and cutaneous antiseptic). Local anesthesia was attained by infiltration with 1% lidocaine without epinephrine. Ultrasound demonstrated patency of the right internal jugular vein, and this was documented with an image. Under real-time ultrasound guidance, this vein was accessed with a 21 gauge micropuncture needle and image documentation was performed. A small dermatotomy was made at the access site with an 11 scalpel. A 0.018" wire was advanced into the SVC and the access needle exchanged for a 23F micropuncture vascular sheath. The 0.018" wire  was then removed and a 0.035" wire advanced into the IVC. Upon withdrawal of the 018 wire, the wire was marked for appropriate length of the internal portion of the catheter. A 15 cm catheter was selected. Skin and subcutaneous tissues were serially dilated. Catheter was placed on the wire. The catheter tip is positioned in the upper right atrium. This was documented with a spot image. Both ports of the hemodialysis catheter were then tested for excellent function. The ports were then locked with heparinized lock. Patient tolerated the procedure well and remained hemodynamically stable throughout. No complications were encountered and no significant blood loss was encountered. IMPRESSION: Status post right IJ temporary hemodialysis catheter placement. Signed, Dulcy Fanny. Dellia Nims, RPVI [name] Electronically Signed   By: Corrie Mckusick D.O.   On: 01/19/2022 10:08        Scheduled Meds:  aspirin EC  81 mg Oral Daily   Chlorhexidine Gluconate Cloth  6 each Topical Daily   Chlorhexidine Gluconate Cloth  6 each Topical Q0600   clopidogrel  75 mg Oral Daily   darbepoetin (ARANESP) injection - DIALYSIS  100 mcg Intravenous Q Tue-HD   heparin sodium (porcine)       hydrALAZINE  25 mg Oral Q8H   insulin aspart  0-6 Units Subcutaneous Q4H   metoCLOPramide (REGLAN) injection  5 mg Intravenous TID WC & HS   metoprolol tartrate  25 mg Oral Q6H   pantoprazole (PROTONIX) IV  40 mg Intravenous Q24H   sodium chloride flush  3 mL Intravenous Q12H   Continuous Infusions:  sodium chloride     sodium chloride      ceFAZolin (ANCEF) IV     heparin 650 Units/hr (01/19/22 0552)   nitroGLYCERIN 50 mcg/min (01/19/22 1136)     LOS: 4 days    Time spent: 35 minutes    Barb Merino, MD [facility] Pager [phone]

## 2022-01-19 NOTE — Progress Notes (Signed)
° °  Non tunneled dialysis catheter was placed this am in IR  After bath- while tech was out of room for a few minutes-- pt pulled catheter out per RN Have now placed mittens on pt per RN  Pt to go for cardiac cath in less than an hour today IR will replace dialysis catheter in am  I have discussed with Son [name]-- he is aware of plan and gives consent

## 2022-01-19 NOTE — Progress Notes (Signed)
ANTICOAGULATION CONSULT NOTE - Follow Up Consult  Pharmacy Consult for Heparin Indication: chest pain/ACS  Allergies  Allergen Reactions   Invokana [Canagliflozin] Itching, Swelling and Other (See Comments)    Vaginal itching, swelling and irritation   Jardiance [Empagliflozin] Itching, Swelling and Other (See Comments)    Vaginal itching and swelling   Tuberculin Ppd Other (See Comments)    Per records from [facility]     Patient Measurements: Height: 5\' 5"  (165.1 cm) Weight: 61.2 kg (134 lb 14.7 oz) IBW/kg (Calculated) : 57 Heparin Dosing Weight: 64.9 kg  Vital Signs: Temp: 97.6 F (36.4 C) (01/24 0413) Temp Source: Oral (01/24 0413) BP: 164/92 (01/23 2346) Pulse Rate: 95 (01/24 0413)  Labs: Recent Labs     0000 01/16/22 2148 01/17/22 0249 01/17/22 1105 01/17/22 2251 01/18/22 0344 01/18/22 1039 01/19/22 0253  HGB   < >  --  9.0*  --   --  8.7*  --  8.4*  HCT  --   --  28.7*  --   --  26.5*  --  25.1*  PLT  --   --  192  --   --  204  --  196  HEPARINUNFRC  --   --   --    < > 0.27*  --  0.49 0.39  CREATININE  --   --  8.12*  --   --  8.45*  --  9.05*  TROPONINIHS  --  3,297* 6,368*  --   --   --   --   --    < > = values in this interval not displayed.     Estimated Creatinine Clearance: 4.2 mL/min (A) (by C-G formula based on SCr of 9.05 mg/dL (H)).   Medications:  Scheduled:   aspirin EC  81 mg Oral Daily   clopidogrel  75 mg Oral Daily   hydrALAZINE  25 mg Oral Q8H   insulin aspart  0-6 Units Subcutaneous Q4H   metoCLOPramide (REGLAN) injection  5 mg Intravenous TID WC & HS   metoprolol tartrate  25 mg Oral Q6H   pantoprazole (PROTONIX) IV  40 mg Intravenous Q24H   sodium chloride flush  3 mL Intravenous Q12H   Infusions:   sodium chloride     sodium chloride      ceFAZolin (ANCEF) IV     heparin 650 Units/hr (01/19/22 0552)   nitroGLYCERIN 50 mcg/min (01/18/22 1845)    Assessment: 87 yo F presenting with chest pain, significant bump on  troponin. Patient was not on anticoagulation PTA. Pharmacy consulted to start IV heparin for ACS.   Heparin level this AM is at goal on 650 units/hr. No s/s of overt bleeding noted per RN.    Goal of Therapy:  Heparin level 0.3-0.7 units/ml Monitor platelets by anticoagulation protocol: Yes   Plan:  Continue heparin gtt at 650 units/hr. Plan for cath today  Erin Hearing PharmD., BCPS Clinical Pharmacist 01/19/2022 8:00 AM

## 2022-01-20 ENCOUNTER — Inpatient Hospital Stay (HOSPITAL_COMMUNITY): Payer: Medicare Other

## 2022-01-20 ENCOUNTER — Encounter (HOSPITAL_COMMUNITY)
Admission: EM | Disposition: A | Discharge status: Home Health | Payer: Self-pay | Source: Home / Self Care | Attending: Internal Medicine

## 2022-01-20 DIAGNOSIS — I10 Essential (primary) hypertension: Secondary | ICD-10-CM | POA: Diagnosis not present

## 2022-01-20 DIAGNOSIS — I214 Non-ST elevation (NSTEMI) myocardial infarction: Secondary | ICD-10-CM | POA: Diagnosis present

## 2022-01-20 DIAGNOSIS — R778 Other specified abnormalities of plasma proteins: Secondary | ICD-10-CM | POA: Diagnosis not present

## 2022-01-20 DIAGNOSIS — I2510 Atherosclerotic heart disease of native coronary artery without angina pectoris: Secondary | ICD-10-CM | POA: Diagnosis not present

## 2022-01-20 DIAGNOSIS — N185 Chronic kidney disease, stage 5: Secondary | ICD-10-CM | POA: Diagnosis not present

## 2022-01-20 DIAGNOSIS — I5033 Acute on chronic diastolic (congestive) heart failure: Secondary | ICD-10-CM | POA: Diagnosis not present

## 2022-01-20 HISTORY — PX: LEFT HEART CATH AND CORONARY ANGIOGRAPHY: CATH118249

## 2022-01-20 HISTORY — PX: IR FLUORO GUIDE CV LINE RIGHT: IMG2283

## 2022-01-20 HISTORY — PX: IR US GUIDE VASC ACCESS RIGHT: IMG2390

## 2022-01-20 LAB — CBC WITH DIFFERENTIAL/PLATELET
Abs Immature Granulocytes: 0.12 10*3/uL — ABNORMAL HIGH (ref 0.00–0.07)
Basophils Absolute: 0 10*3/uL (ref 0.0–0.1)
Basophils Relative: 0 %
Eosinophils Absolute: 0 10*3/uL (ref 0.0–0.5)
Eosinophils Relative: 0 %
HCT: 24.9 % — ABNORMAL LOW (ref 36.0–46.0)
Hemoglobin: 8.3 g/dL — ABNORMAL LOW (ref 12.0–15.0)
Immature Granulocytes: 1 %
Lymphocytes Relative: 14 %
Lymphs Abs: 1.2 10*3/uL (ref 0.7–4.0)
MCH: 31.4 pg (ref 26.0–34.0)
MCHC: 33.3 g/dL (ref 30.0–36.0)
MCV: 94.3 fL (ref 80.0–100.0)
Monocytes Absolute: 0.9 10*3/uL (ref 0.1–1.0)
Monocytes Relative: 10 %
Neutro Abs: 6.4 10*3/uL (ref 1.7–7.7)
Neutrophils Relative %: 75 %
Platelets: 205 10*3/uL (ref 150–400)
RBC: 2.64 MIL/uL — ABNORMAL LOW (ref 3.87–5.11)
RDW: 14 % (ref 11.5–15.5)
WBC: 8.6 10*3/uL (ref 4.0–10.5)
nRBC: 0 % (ref 0.0–0.2)

## 2022-01-20 LAB — GLUCOSE, CAPILLARY
Glucose-Capillary: 131 mg/dL — ABNORMAL HIGH (ref 70–99)
Glucose-Capillary: 132 mg/dL — ABNORMAL HIGH (ref 70–99)
Glucose-Capillary: 134 mg/dL — ABNORMAL HIGH (ref 70–99)
Glucose-Capillary: 138 mg/dL — ABNORMAL HIGH (ref 70–99)
Glucose-Capillary: 140 mg/dL — ABNORMAL HIGH (ref 70–99)
Glucose-Capillary: 142 mg/dL — ABNORMAL HIGH (ref 70–99)
Glucose-Capillary: 163 mg/dL — ABNORMAL HIGH (ref 70–99)

## 2022-01-20 LAB — IRON AND TIBC
Iron: 73 ug/dL (ref 28–170)
Saturation Ratios: 36 % — ABNORMAL HIGH (ref 10.4–31.8)
TIBC: 200 ug/dL — ABNORMAL LOW (ref 250–450)
UIBC: 127 ug/dL

## 2022-01-20 LAB — RENAL FUNCTION PANEL
Albumin: 2.7 g/dL — ABNORMAL LOW (ref 3.5–5.0)
Anion gap: 15 (ref 5–15)
BUN: 78 mg/dL — ABNORMAL HIGH (ref 8–23)
CO2: 15 mmol/L — ABNORMAL LOW (ref 22–32)
Calcium: 8.9 mg/dL (ref 8.9–10.3)
Chloride: 99 mmol/L (ref 98–111)
Creatinine, Ser: 9.23 mg/dL — ABNORMAL HIGH (ref 0.44–1.00)
GFR, Estimated: 4 mL/min — ABNORMAL LOW (ref >60)
Glucose, Bld: 140 mg/dL — ABNORMAL HIGH (ref 70–99)
Phosphorus: 8.3 mg/dL — ABNORMAL HIGH (ref 2.5–4.6)
Potassium: 4.4 mmol/L (ref 3.5–5.1)
Sodium: 129 mmol/L — ABNORMAL LOW (ref 135–145)

## 2022-01-20 LAB — FERRITIN: Ferritin: 2430 ng/mL — ABNORMAL HIGH (ref 11–307)

## 2022-01-20 LAB — POCT ACTIVATED CLOTTING TIME: Activated Clotting Time: 137 seconds

## 2022-01-20 LAB — HEPATITIS B SURFACE ANTIBODY,QUALITATIVE: Hep B S Ab: REACTIVE — AB

## 2022-01-20 LAB — HEPARIN LEVEL (UNFRACTIONATED): Heparin Unfractionated: 0.43 IU/mL (ref 0.30–0.70)

## 2022-01-20 SURGERY — LEFT HEART CATH AND CORONARY ANGIOGRAPHY
Anesthesia: LOCAL

## 2022-01-20 MED ORDER — CHLORHEXIDINE GLUCONATE CLOTH 2 % EX PADS
6.0000 | MEDICATED_PAD | Freq: Every day | CUTANEOUS | Status: DC
  Administered 2022-01-21 – 2022-01-22 (×2): 6 via TOPICAL

## 2022-01-20 MED ORDER — HEPARIN SODIUM (PORCINE) 1000 UNIT/ML IJ SOLN
INTRAMUSCULAR | Status: AC
  Filled 2022-01-20: qty 10

## 2022-01-20 MED ORDER — SODIUM CHLORIDE 0.9 % IV SOLN: INTRAVENOUS | Status: DC

## 2022-01-20 MED ORDER — SODIUM CHLORIDE 0.9% FLUSH
3.0000 mL | Freq: Two times a day (BID) | INTRAVENOUS | Status: DC
  Administered 2022-01-20 – 2022-01-28 (×17): 3 mL via INTRAVENOUS

## 2022-01-20 MED ORDER — CLOPIDOGREL BISULFATE 75 MG PO TABS
75.0000 mg | ORAL_TABLET | Freq: Every day | ORAL | Status: DC
  Administered 2022-01-21 – 2022-01-29 (×9): 75 mg via ORAL
  Filled 2022-01-20 (×9): qty 1

## 2022-01-20 MED ORDER — HEPARIN (PORCINE) IN NACL 1000-0.9 UT/500ML-% IV SOLN
INTRAVENOUS | Status: DC | PRN
  Administered 2022-01-20 (×2): 500 mL

## 2022-01-20 MED ORDER — LABETALOL HCL 5 MG/ML IV SOLN: 10.0000 mg | INTRAVENOUS | Status: AC | PRN

## 2022-01-20 MED ORDER — LIDOCAINE HCL 1 % IJ SOLN
INTRAMUSCULAR | Status: AC
  Filled 2022-01-20: qty 20

## 2022-01-20 MED ORDER — ASPIRIN 81 MG PO CHEW
81.0000 mg | CHEWABLE_TABLET | Freq: Every day | ORAL | Status: DC
  Administered 2022-01-21: 81 mg via ORAL
  Filled 2022-01-20: qty 1

## 2022-01-20 MED ORDER — HEPARIN (PORCINE) IN NACL 1000-0.9 UT/500ML-% IV SOLN
INTRAVENOUS | Status: AC
  Filled 2022-01-20: qty 1000

## 2022-01-20 MED ORDER — HYDRALAZINE HCL 20 MG/ML IJ SOLN: 10.0000 mg | INTRAMUSCULAR | Status: AC | PRN

## 2022-01-20 MED ORDER — MIDAZOLAM HCL 2 MG/2ML IJ SOLN
INTRAMUSCULAR | Status: AC
  Filled 2022-01-20: qty 2

## 2022-01-20 MED ORDER — ASPIRIN 81 MG PO CHEW
81.0000 mg | CHEWABLE_TABLET | ORAL | Status: AC
  Administered 2022-01-20: 07:00:00 81 mg via ORAL
  Filled 2022-01-20: qty 1

## 2022-01-20 MED ORDER — MIDAZOLAM HCL 2 MG/2ML IJ SOLN
INTRAMUSCULAR | Status: DC | PRN
  Administered 2022-01-20: .5 mg via INTRAVENOUS

## 2022-01-20 MED ORDER — HYDRALAZINE HCL 20 MG/ML IJ SOLN
INTRAMUSCULAR | Status: AC
  Filled 2022-01-20: qty 1

## 2022-01-20 MED ORDER — IOHEXOL 350 MG/ML SOLN
INTRAVENOUS | Status: DC | PRN
  Administered 2022-01-20: 12:00:00 55 mL

## 2022-01-20 MED ORDER — LIDOCAINE HCL (PF) 1 % IJ SOLN
INTRAMUSCULAR | Status: DC | PRN
  Administered 2022-01-20: 5 mL
  Administered 2022-01-20: 15 mL

## 2022-01-20 MED ORDER — CALCIUM ACETATE (PHOS BINDER) 667 MG PO CAPS
667.0000 mg | ORAL_CAPSULE | Freq: Three times a day (TID) | ORAL | Status: DC
  Administered 2022-01-20 – 2022-01-29 (×21): 667 mg via ORAL
  Filled 2022-01-20 (×22): qty 1

## 2022-01-20 MED ORDER — HYDRALAZINE HCL 20 MG/ML IJ SOLN
INTRAMUSCULAR | Status: DC | PRN
  Administered 2022-01-20: 10 mg via INTRAVENOUS

## 2022-01-20 MED ORDER — LIDOCAINE HCL (PF) 1 % IJ SOLN
INTRAMUSCULAR | Status: AC
  Filled 2022-01-20: qty 30

## 2022-01-20 MED ORDER — ATORVASTATIN CALCIUM 80 MG PO TABS
80.0000 mg | ORAL_TABLET | Freq: Every day | ORAL | Status: DC
  Administered 2022-01-20 – 2022-01-29 (×10): 80 mg via ORAL
  Filled 2022-01-20 (×10): qty 1

## 2022-01-20 MED ORDER — ACETAMINOPHEN 325 MG PO TABS
650.0000 mg | ORAL_TABLET | ORAL | Status: DC | PRN
  Administered 2022-01-26 – 2022-01-28 (×2): 650 mg via ORAL
  Filled 2022-01-20 (×2): qty 2

## 2022-01-20 MED ORDER — SODIUM CHLORIDE 0.9 % IV SOLN: 250.0000 mL | INTRAVENOUS | Status: DC | PRN

## 2022-01-20 MED ORDER — ONDANSETRON HCL 4 MG/2ML IJ SOLN: 4.0000 mg | Freq: Four times a day (QID) | INTRAMUSCULAR | Status: DC | PRN

## 2022-01-20 MED ORDER — SODIUM CHLORIDE 0.9% FLUSH: 3.0000 mL | INTRAVENOUS | Status: DC | PRN

## 2022-01-20 SURGICAL SUPPLY — 13 items
CATH INFINITI 5 FR 3DRC (CATHETERS) ×1 IMPLANT
CATH INFINITI 5FR MULTPACK ANG (CATHETERS) ×1 IMPLANT
ELECT DEFIB PAD ADLT CADENCE (PAD) ×1 IMPLANT
GLIDESHEATH SLEND SS 6F .021 (SHEATH) IMPLANT
GUIDEWIRE INQWIRE 1.5J.035X260 (WIRE) IMPLANT
INQWIRE 1.5J .035X260CM (WIRE)
KIT HEART LEFT (KITS) ×2 IMPLANT
PACK CARDIAC CATHETERIZATION (CUSTOM PROCEDURE TRAY) ×2 IMPLANT
SHEATH PINNACLE 5F 10CM (SHEATH) ×1 IMPLANT
SHEATH PROBE COVER 6X72 (BAG) ×1 IMPLANT
TRANSDUCER W/STOPCOCK (MISCELLANEOUS) ×2 IMPLANT
TUBING CIL FLEX 10 FLL-RA (TUBING) ×1 IMPLANT
WIRE EMERALD 3MM-J .035X150CM (WIRE) ×1 IMPLANT

## 2022-01-20 NOTE — Progress Notes (Signed)
Unable to run pt on HD d/t internal factors, most likely swelling, pt s/p catheter insertion.  Catheter sluggish and resistant to flush, machine Arterial Pressure -320, reversed, same result.  Provider in HD unit made aware at initial interruption and advised cathflo.  Cathflo administered with to maximum efficacy of 2 hours, without improvement.  Provider in unit advised to notify ordering provider. Ordering provider walked onto unit as pt was prepared to return to home unit.

## 2022-01-20 NOTE — Progress Notes (Signed)
Report given to HD, Elsie Saas, Therapist, sports.

## 2022-01-20 NOTE — Progress Notes (Signed)
Spoke to Dr. Hilma Favors about patient's BP (trending upwards) and upcoming BP medications.  Dr. Hilma Favors stated to hold these as patient is scheduled for HD later today.

## 2022-01-20 NOTE — Progress Notes (Signed)
Subjective:  pt with new HD cath in place-  she has mittens on  " I feel like im losing my mind"  due for first HD today   Objective Vital signs in last 24 hours: Vitals:   01/20/22 0222 01/20/22 0429 01/20/22 0451 01/20/22 0852  BP: 127/66 (!) 143/71 (!) 156/76 (!) 166/87  Pulse:  81 83 87  Resp:    20  Temp:   97.8 F (36.6 C) (!) 97.4 F (36.3 C)  TempSrc:   Oral Axillary  SpO2:  100% 100%   Weight:      Height:       Weight change:   Intake/Output Summary (Last 24 hours) at 01/20/2022 1032 Last data filed at 01/20/2022 8502 Gross per 24 hour  Intake 637.59 ml  Output --  Net 637.59 ml    Assessment/ Plan: Pt is a 87 y.o. yo female with known advanced CKD who was admitted on 01/15/2022 with acute coronary event  Assessment/Plan: 1. Acute coronary event-  now on nitro and heparin-  attempting to medically manage-  concern about doing heart cath given current state and advanced CKD-  now plans have changed and heart cath is planned for when patient can cooperate-  she denies CP at present  2. Renal-  apparently had been asked several times as OP if she wanted dialysis and she always declined even when faced with decision that she could die without HD -  this is documented in the [place] record.  Palliative care meeting took place and decision was made to go ahead and pursue HD wanting to do what we could to prolong life.  Now has temp HD cath in place after removing the first-  will try for first HD very soon-  will likely need HD tomorrow as well in an attempt to get her clearer so she can undergo cardiac cath   3. Anemia-  hgb dropping on heparin -   iron stores OK -  have added on esa   4. HTN/volume-  hypertensive-  volume seems OK-  on nitro/hydralazine/lopressor -  volume does not seem to be a major player at this time  5. Bones- started phoslo-  PTH pending      Louis Meckel    Labs: Basic Metabolic Panel: Recent Labs  Lab 01/18/22 0344 01/19/22 0253  01/20/22 0546  NA 135 133* 129*  K 4.7 4.3 4.4  CL 105 103 99  CO2 16* 16* 15*  GLUCOSE 164* 137* 140*  BUN 78* 71* 78*  CREATININE 8.45* 9.05* 9.23*  CALCIUM 8.9 8.9 8.9  PHOS 9.0* 8.4* 8.3*   Liver Function Tests: Recent Labs  Lab 01/15/22 1631 01/16/22 0222 01/17/22 0249 01/18/22 0344 01/19/22 0253 01/20/22 0546  AST 23 19  --   --   --   --   ALT 17 11  --   --   --   --   ALKPHOS 53 47  --   --   --   --   BILITOT 1.0 0.7  --   --   --   --   PROT 6.5 5.4*  --   --   --   --   ALBUMIN 3.0* 2.6*   < > 2.8* 2.8* 2.7*   < > = values in this interval not displayed.   Recent Labs  Lab 01/17/22 1105  LIPASE 23   No results for input(s): AMMONIA in the last 168 hours. CBC: Recent Labs  Lab 01/16/22 0222 01/17/22 0249 01/18/22 0344 01/19/22 0253 01/20/22 0546  WBC 7.7 7.9 11.3* 10.3 8.6  NEUTROABS 5.6 5.6 9.0* 8.1* 6.4  HGB 9.0* 9.0* 8.7* 8.4* 8.3*  HCT 28.3* 28.7* 26.5* 25.1* 24.9*  MCV 96.3 97.6 95.3 93.3 94.3  PLT 210 192 204 196 205   Cardiac Enzymes: No results for input(s): CKTOTAL, CKMB, CKMBINDEX, TROPONINI in the last 168 hours. CBG: Recent Labs  Lab 01/19/22 1544 01/19/22 2028 01/20/22 0038 01/20/22 0448 01/20/22 0749  GLUCAP 146* 147* 131* 134* 142*    Iron Studies:  Recent Labs    01/20/22 0546  IRON 73  TIBC 200*  FERRITIN 2,430*   Studies/Results: IR Fluoro Guide CV Line Right  Result Date: 01/20/2022 INDICATION: Renal failure EXAM: Ultrasound fluoroscopic guided temporary dialysis catheter placement MEDICATIONS: None ANESTHESIA/SEDATION: None FLUOROSCOPY TIME:  Fluoroscopy Time: 0 minutes 24 seconds (1 mGy). COMPLICATIONS: None immediate. PROCEDURE: Informed written consent was obtained from the patient after a thorough discussion of the procedural risks, benefits and alternatives. All questions were addressed. Maximal Sterile Barrier Technique was utilized including caps, mask, sterile gowns, sterile gloves, sterile drape, hand  hygiene and skin antiseptic. A timeout was performed prior to the initiation of the procedure. Right neck prepped and draped in the usual sterile fashion. All elements of maximal sterile barrier were utilized including, cap, mask, sterile gown, sterile gloves, large sterile drape, hand scrubbing and 2% Chlorhexidine for skin cleaning. The right internal jugular vein was evaluated with ultrasound and shown to be patent. A permanent ultrasound image was obtained and placed in the patient's medical record. Using sterile gel and a sterile probe cover, the right internal jugular vein was entered with a 21 ga needle during real time ultrasound guidance. 0.018 inch guidewire placed and 21 ga needle exchanged for transitional dilator set. Utilizing fluoroscopy, 0.035 inch guidewire advanced through the needle without difficulty. Serial dilation performed, and catheter inserted over the guidewire. The tip was positioned in the right atrium. All lumens of the catheter aspirated and flushed well. The dialysis lumens were locked with Heparin. The catheter was secured to the skin with suture. The insertion site was covered with sterile dressing. IMPRESSION: Temporary 15 cm right IJ hemodialysis catheter ready for use. Electronically Signed   By: Miachel Roux M.D.   On: 01/20/2022 09:16   IR Fluoro Guide CV Line Right  Result Date: 01/19/2022 INDICATION: 87 year old female referred for hemodialysis catheter EXAM: IMAGE GUIDED TEMPORARY HEMODIALYSIS CATHETER PLACEMENT MEDICATIONS: None ANESTHESIA/SEDATION: Moderate (conscious) sedation was not employed during this procedure. Total intra-service moderate Sedation Time: 0 minutes. The patient's level of consciousness and vital signs were monitored continuously by radiology nursing throughout the procedure under my direct supervision. FLUOROSCOPY TIME:  Fluoroscopy Time: 0 minutes 42 seconds (1 mGy). COMPLICATIONS: None PROCEDURE: Informed written consent was obtained from the  patient and the patient's family after a thorough discussion of the procedural risks, benefits and alternatives. All questions were addressed. A timeout was performed prior to the initiation of the procedure. The right neck and chest was prepped with chlorhexidine, and draped in the usual sterile fashion using maximum barrier technique (cap and mask, sterile gown, sterile gloves, large sterile sheet, hand hygiene and cutaneous antiseptic). Local anesthesia was attained by infiltration with 1% lidocaine without epinephrine. Ultrasound demonstrated patency of the right internal jugular vein, and this was documented with an image. Under real-time ultrasound guidance, this vein was accessed with a 21 gauge micropuncture needle and image documentation was performed. A  small dermatotomy was made at the access site with an 11 scalpel. A 0.018" wire was advanced into the SVC and the access needle exchanged for a 27F micropuncture vascular sheath. The 0.018" wire was then removed and a 0.035" wire advanced into the IVC. Upon withdrawal of the 018 wire, the wire was marked for appropriate length of the internal portion of the catheter. A 15 cm catheter was selected. Skin and subcutaneous tissues were serially dilated. Catheter was placed on the wire. The catheter tip is positioned in the upper right atrium. This was documented with a spot image. Both ports of the hemodialysis catheter were then tested for excellent function. The ports were then locked with heparinized lock. Patient tolerated the procedure well and remained hemodynamically stable throughout. No complications were encountered and no significant blood loss was encountered. IMPRESSION: Status post right IJ temporary hemodialysis catheter placement. Signed, Dulcy Fanny. Dellia Nims, RPVI [facility] Radiology Electronically Signed   By: Corrie Mckusick D.O.   On: 01/19/2022 10:08   IR US Guide Vasc Access Right  Result  Date: 01/20/2022 INDICATION: Renal failure EXAM: Ultrasound fluoroscopic guided temporary dialysis catheter placement MEDICATIONS: None ANESTHESIA/SEDATION: None FLUOROSCOPY TIME:  Fluoroscopy Time: 0 minutes 24 seconds (1 mGy). COMPLICATIONS: None immediate. PROCEDURE: Informed written consent was obtained from the patient after a thorough discussion of the procedural risks, benefits and alternatives. All questions were addressed. Maximal Sterile Barrier Technique was utilized including caps, mask, sterile gowns, sterile gloves, sterile drape, hand hygiene and skin antiseptic. A timeout was performed prior to the initiation of the procedure. Right neck prepped and draped in the usual sterile fashion. All elements of maximal sterile barrier were utilized including, cap, mask, sterile gown, sterile gloves, large sterile drape, hand scrubbing and 2% Chlorhexidine for skin cleaning. The right internal jugular vein was evaluated with ultrasound and shown to be patent. A permanent ultrasound image was obtained and placed in the patient's medical record. Using sterile gel and a sterile probe cover, the right internal jugular vein was entered with a 21 ga needle during real time ultrasound guidance. 0.018 inch guidewire placed and 21 ga needle exchanged for transitional dilator set. Utilizing fluoroscopy, 0.035 inch guidewire advanced through the needle without difficulty. Serial dilation performed, and catheter inserted over the guidewire. The tip was positioned in the right atrium. All lumens of the catheter aspirated and flushed well. The dialysis lumens were locked with Heparin. The catheter was secured to the skin with suture. The insertion site was covered with sterile dressing. IMPRESSION: Temporary 15 cm right IJ hemodialysis catheter ready for use. Electronically Signed   By: Miachel Roux M.D.   On: 01/20/2022 09:16   IR US Guide Vasc Access Right  Result Date: 01/19/2022 INDICATION: 87 year old female  referred for hemodialysis catheter EXAM: IMAGE GUIDED TEMPORARY HEMODIALYSIS CATHETER PLACEMENT MEDICATIONS: None ANESTHESIA/SEDATION: Moderate (conscious) sedation was not employed during this procedure. Total intra-service moderate Sedation Time: 0 minutes. The patient's level of consciousness and vital signs were monitored continuously by radiology nursing throughout the procedure under my direct supervision. FLUOROSCOPY TIME:  Fluoroscopy Time: 0 minutes 42 seconds (1 mGy). COMPLICATIONS: None PROCEDURE: Informed written consent was obtained from the patient and the patient's family after a thorough discussion of the procedural risks, benefits and alternatives. All questions were addressed. A timeout was performed prior to the initiation of the procedure. The right neck and chest was prepped with chlorhexidine, and draped in the usual sterile fashion using maximum barrier  technique (cap and mask, sterile gown, sterile gloves, large sterile sheet, hand hygiene and cutaneous antiseptic). Local anesthesia was attained by infiltration with 1% lidocaine without epinephrine. Ultrasound demonstrated patency of the right internal jugular vein, and this was documented with an image. Under real-time ultrasound guidance, this vein was accessed with a 21 gauge micropuncture needle and image documentation was performed. A small dermatotomy was made at the access site with an 11 scalpel. A 0.018" wire was advanced into the SVC and the access needle exchanged for a 34F micropuncture vascular sheath. The 0.018" wire was then removed and a 0.035" wire advanced into the IVC. Upon withdrawal of the 018 wire, the wire was marked for appropriate length of the internal portion of the catheter. A 15 cm catheter was selected. Skin and subcutaneous tissues were serially dilated. Catheter was placed on the wire. The catheter tip is positioned in the upper right atrium. This was documented with a spot image. Both ports of the hemodialysis  catheter were then tested for excellent function. The ports were then locked with heparinized lock. Patient tolerated the procedure well and remained hemodynamically stable throughout. No complications were encountered and no significant blood loss was encountered. IMPRESSION: Status post right IJ temporary hemodialysis catheter placement. Signed, Dulcy Fanny. Dellia Nims, RPVI [name] Radiology Electronically Signed   By: Corrie Mckusick D.O.   On: 01/19/2022 10:08   Medications: Infusions:  sodium chloride     sodium chloride     sodium chloride     sodium chloride 10 mL/hr at 01/20/22 9417    ceFAZolin (ANCEF) IV     heparin 650 Units/hr (01/20/22 4081)   nitroGLYCERIN 40 mcg/min (01/20/22 0630)    Scheduled Medications:  aspirin EC  81 mg Oral Daily   Chlorhexidine Gluconate Cloth  6 each Topical Daily   Chlorhexidine Gluconate Cloth  6 each Topical Q0600   clopidogrel  75 mg Oral Daily   darbepoetin (ARANESP) injection - DIALYSIS  100 mcg Intravenous Q Tue-HD   heparin sodium (porcine)       hydrALAZINE  25 mg Oral Q8H   insulin aspart  0-6 Units Subcutaneous Q4H   lidocaine       metoCLOPramide (REGLAN) injection  5 mg Intravenous TID WC & HS   metoprolol tartrate  25 mg Oral Q6H   pantoprazole (PROTONIX) IV  40 mg Intravenous Q24H   sodium chloride flush  3 mL Intravenous Q12H    have reviewed scheduled and prn medications.  Physical Exam: General: thin, mittens on -  agitated Heart: tachy  Lungs: mostly clear Abdomen: soft, non tender Extremities: no significant peripheral edema  New right sided temp HD cath placed 1/24 -  pt removed-  new one placed 1/25 -  bandaged     01/20/2022,10:32 AM  LOS: 5 days

## 2022-01-20 NOTE — Progress Notes (Signed)
Bleeding noted at HD insertion site.  Paged Dr. Dwaine Gale.  Pressure held x 5 minutes  IR called and stated they would notify Alfredia Ferguson NP to come assess.    Spoke to [place] via [employer], she states the patient will likely bleed until she has HD.

## 2022-01-20 NOTE — Plan of Care (Signed)

## 2022-01-20 NOTE — Progress Notes (Signed)
Multiple attempts made by IR today to stop the bleeding from the right IJ temp cath site. Patient just now seen in HD and a purse string sutured was applied. Site still with some slight oozing - thrombi pad and pressure dressing applied. Site appears stable but please call IR if any further bleeding.   Please call IR with any questions.  Soyla Dryer, [place] [phone] 01/20/2022, 4:11 PM

## 2022-01-20 NOTE — Progress Notes (Signed)
I happen to come up to the HD unit-  HD staff informed me that they attempted HD and it was not successful-  tried cath flo and it was still not successful-   I had put in orders for her second treatment tomorrow assuming that she was getting HD  but now will d/c that order and just attempt to do first day orders tomorrow and second on Friday   Monique Cabrera

## 2022-01-20 NOTE — Progress Notes (Signed)
Site area: rt groin arterial sheath Site Prior to Removal:  Level 0 Pressure Applied For: 20 minutes Manual:   yes Patient Status During Pull:  stable Post Pull Site:  Level 0 Post Pull Instructions Given:  yes Post Pull Pulses Present: *Rt BKA Dressing Applied:  gauze and tegaderm Bedrest begins @ 1240 Comments:

## 2022-01-20 NOTE — Progress Notes (Signed)
Progress Note  Patient Name: Monique Cabrera Date of Encounter: 01/20/2022  [facility] Cardiologist: Kathlyn Sacramento, MD   Subjective   Monique Cabrera doing well. No acute distress  Inpatient Medications    Scheduled Meds:  [MAR Hold] aspirin EC  81 mg Oral Daily   [MAR Hold] calcium acetate  667 mg Oral TID WC   [MAR Hold] Chlorhexidine Gluconate Cloth  6 each Topical Daily   [MAR Hold] Chlorhexidine Gluconate Cloth  6 each Topical Q0600   [MAR Hold] clopidogrel  75 mg Oral Daily   [MAR Hold] darbepoetin (ARANESP) injection - DIALYSIS  100 mcg Intravenous Q Tue-HD   heparin sodium (porcine)       [MAR Hold] hydrALAZINE  25 mg Oral Q8H   [MAR Hold] insulin aspart  0-6 Units Subcutaneous Q4H   lidocaine       [MAR Hold] metoCLOPramide (REGLAN) injection  5 mg Intravenous TID WC & HS   [MAR Hold] metoprolol tartrate  25 mg Oral Q6H   [MAR Hold] pantoprazole (PROTONIX) IV  40 mg Intravenous Q24H   [MAR Hold] sodium chloride flush  3 mL Intravenous Q12H   Continuous Infusions:  sodium chloride     [MAR Hold] sodium chloride     [MAR Hold] sodium chloride     sodium chloride 10 mL/hr at 01/20/22 0623   sodium chloride     [MAR Hold]  ceFAZolin (ANCEF) IV     heparin Stopped (01/20/22 1102)   nitroGLYCERIN 40 mcg/min (01/20/22 0630)   PRN Meds: sodium chloride, [MAR Hold] sodium chloride, [MAR Hold] sodium chloride, sodium chloride, [MAR Hold] acetaminophen **OR** [MAR Hold] acetaminophen, [MAR Hold] alteplase, [MAR Hold] fentaNYL (SUBLIMAZE) injection, Heparin (Porcine) in NaCl, [MAR Hold] heparin, hydrALAZINE, iohexol, [MAR Hold] lidocaine (PF), lidocaine (PF), [MAR Hold] lidocaine-prilocaine, [MAR Hold] metoprolol tartrate, midazolam, [MAR Hold] ondansetron **OR** [MAR Hold] ondansetron (ZOFRAN) IV, [MAR Hold] ondansetron, [MAR Hold] pentafluoroprop-tetrafluoroeth, [MAR Hold] promethazine (PHENERGAN) injection (IM or IVPB), sodium chloride flush   Vital Signs    Vitals:    01/20/22 1220 01/20/22 1225 01/20/22 1230 01/20/22 1240  BP: (!) 166/68 (!) 167/83 (!) 157/66 (!) 153/60  Pulse: 84 88 84 84  Resp: 14 (!) 23 (!) 24 (!) 27  Temp:      TempSrc:      SpO2: 100% 100% 100% 100%  Weight:      Height:        Intake/Output Summary (Last 24 hours) at 01/20/2022 1254 Last data filed at 01/20/2022 0651 Gross per 24 hour  Intake 637.59 ml  Output --  Net 637.59 ml   Last 3 Weights 01/19/2022 01/18/2022 01/17/2022  Weight (lbs) 134 lb 14.7 oz 133 lb 13.1 oz 134 lb 7.7 oz  Weight (kg) 61.2 kg 60.7 kg 61 kg      Telemetry    Sinus rhythm- Personally Reviewed  ECG    No new tracings - Personally Reviewed  Physical Exam   GEN: well appearing Neck: No JVD Cardiac: RRR, no murmurs, rubs, or gallops.  Respiratory: Anterior lung exam unlabored GI: Soft, nontender, non-distended  MS: No edema; Right BKA Neuro:  Nonfocal, Alert and oriented x3  Psych: Normal affect   Labs    High Sensitivity Troponin:   Recent Labs  Lab 01/15/22 1818 01/16/22 0457 01/16/22 0700 01/16/22 2148 01/17/22 0249  TROPONINIHS 244* 578* 449* 3,297* 6,368*     Chemistry Recent Labs  Lab 01/15/22 1631 01/16/22 0222 01/17/22 0249 01/18/22 0344 01/19/22 0253 01/20/22  0546  NA 137 137   < > 135 133* 129*  K 4.2 4.3   < > 4.7 4.3 4.4  CL 104 106   < > 105 103 99  CO2 18* 17*   < > 16* 16* 15*  GLUCOSE 115* 103*   < > 164* 137* 140*  BUN 51* 56*   < > 78* 71* 78*  CREATININE 7.50* 7.59*   < > 8.45* 9.05* 9.23*  CALCIUM 9.3 8.7*   < > 8.9 8.9 8.9  MG  --  1.9  --   --   --   --   PROT 6.5 5.4*  --   --   --   --   ALBUMIN 3.0* 2.6*   < > 2.8* 2.8* 2.7*  AST 23 19  --   --   --   --   ALT 17 11  --   --   --   --   ALKPHOS 53 47  --   --   --   --   BILITOT 1.0 0.7  --   --   --   --   GFRNONAA 5* 5*   < > 4* 4* 4*  ANIONGAP 15 14   < > _0 < > = values in this interval not displayed.    Lipids  Recent Labs  Lab 01/17/22 0249  CHOL 160  TRIG 82   HDL 61  LDLCALC 83  CHOLHDL 2.6    Hematology Recent Labs  Lab 01/18/22 0344 01/19/22 0253 01/20/22 0546  WBC 11.3* 10.3 8.6  RBC 2.78* 2.69* 2.64*  HGB 8.7* 8.4* 8.3*  HCT 26.5* 25.1* 24.9*  MCV 95.3 93.3 94.3  MCH 31.3 31.2 31.4  MCHC 32.8 33.5 33.3  RDW 13.9 13.8 14.0  PLT 204 196 205   Thyroid No results for input(s): TSH, FREET4 in the last 168 hours.  BNP Recent Labs  Lab 01/15/22 1818  BNP >4,500.0*    DDimer No results for input(s): DDIMER in the last 168 hours.   Radiology    CARDIAC CATHETERIZATION  Result Date: 01/20/2022   Prox LAD to Mid LAD lesion is 50% stenosed.   1st Mrg lesion is 80% stenosed.   RPDA lesion is 100% stenosed.   Ost RCA lesion is 100% stenosed.   LV end diastolic pressure is moderately elevated. There is evidence for severe multivessel coronary calcification and vessel tortuosity. The LAD has proximal 50% stenosis.  The left circumflex vessel gives rise to a proximal marginal vessel which has focal 80% stenosis on a bend in the vessel; the RCA is totally occluded proximally.  There is extensive left to right collateralization filling the entire RCA up to the near ostial total occlusion. Elevated LVEDP at 26 mm. RECOMMENDATION: Patient is to initiate dialysis today.  Commend optimal blood pressure control and lipid management.  The patient has been on DAPT with aspirin/Plavix and would continue as long as no significant bleeding.   IR Fluoro Guide CV Line Right  Result Date: 01/20/2022 INDICATION: Renal failure EXAM: Ultrasound fluoroscopic guided temporary dialysis catheter placement MEDICATIONS: None ANESTHESIA/SEDATION: None FLUOROSCOPY TIME:  Fluoroscopy Time: 0 minutes 24 seconds (1 mGy). COMPLICATIONS: None immediate. PROCEDURE: Informed written consent was obtained from the patient after a thorough discussion of the procedural risks, benefits and alternatives. All questions were addressed. Maximal Sterile Barrier Technique was utilized  including caps, mask, sterile gowns, sterile gloves, sterile drape, hand hygiene and skin  antiseptic. A timeout was performed prior to the initiation of the procedure. Right neck prepped and draped in the usual sterile fashion. All elements of maximal sterile barrier were utilized including, cap, mask, sterile gown, sterile gloves, large sterile drape, hand scrubbing and 2% Chlorhexidine for skin cleaning. The right internal jugular vein was evaluated with ultrasound and shown to be patent. A permanent ultrasound image was obtained and placed in the patient's medical record. Using sterile gel and a sterile probe cover, the right internal jugular vein was entered with a 21 ga needle during real time ultrasound guidance. 0.018 inch guidewire placed and 21 ga needle exchanged for transitional dilator set. Utilizing fluoroscopy, 0.035 inch guidewire advanced through the needle without difficulty. Serial dilation performed, and catheter inserted over the guidewire. The tip was positioned in the right atrium. All lumens of the catheter aspirated and flushed well. The dialysis lumens were locked with Heparin. The catheter was secured to the skin with suture. The insertion site was covered with sterile dressing. IMPRESSION: Temporary 15 cm right IJ hemodialysis catheter ready for use. Electronically Signed   By: Miachel Roux M.D.   On: 01/20/2022 09:16   IR Fluoro Guide CV Line Right  Result Date: 01/19/2022 INDICATION: 87 year old female referred for hemodialysis catheter EXAM: IMAGE GUIDED TEMPORARY HEMODIALYSIS CATHETER PLACEMENT MEDICATIONS: None ANESTHESIA/SEDATION: Moderate (conscious) sedation was not employed during this procedure. Total intra-service moderate Sedation Time: 0 minutes. The patient's level of consciousness and vital signs were monitored continuously by radiology nursing throughout the procedure under my direct supervision. FLUOROSCOPY TIME:  Fluoroscopy Time: 0 minutes 42 seconds (1 mGy).  COMPLICATIONS: None PROCEDURE: Informed written consent was obtained from the patient and the patient's family after a thorough discussion of the procedural risks, benefits and alternatives. All questions were addressed. A timeout was performed prior to the initiation of the procedure. The right neck and chest was prepped with chlorhexidine, and draped in the usual sterile fashion using maximum barrier technique (cap and mask, sterile gown, sterile gloves, large sterile sheet, hand hygiene and cutaneous antiseptic). Local anesthesia was attained by infiltration with 1% lidocaine without epinephrine. Ultrasound demonstrated patency of the right internal jugular vein, and this was documented with an image. Under real-time ultrasound guidance, this vein was accessed with a 21 gauge micropuncture needle and image documentation was performed. A small dermatotomy was made at the access site with an 11 scalpel. A 0.018" wire was advanced into the SVC and the access needle exchanged for a 70F micropuncture vascular sheath. The 0.018" wire was then removed and a 0.035" wire advanced into the IVC. Upon withdrawal of the 018 wire, the wire was marked for appropriate length of the internal portion of the catheter. A 15 cm catheter was selected. Skin and subcutaneous tissues were serially dilated. Catheter was placed on the wire. The catheter tip is positioned in the upper right atrium. This was documented with a spot image. Both ports of the hemodialysis catheter were then tested for excellent function. The ports were then locked with heparinized lock. Patient tolerated the procedure well and remained hemodynamically stable throughout. No complications were encountered and no significant blood loss was encountered. IMPRESSION: Status post right IJ temporary hemodialysis catheter placement. Signed, Dulcy Fanny. Dellia Nims, RPVI [facility] Radiology Electronically Signed   By: Corrie Mckusick D.O.   On: 01/19/2022 10:08   IR US Guide Vasc Access Right  Result Date: 01/20/2022 INDICATION: Renal failure EXAM: Ultrasound fluoroscopic guided temporary dialysis  catheter placement MEDICATIONS: None ANESTHESIA/SEDATION: None FLUOROSCOPY TIME:  Fluoroscopy Time: 0 minutes 24 seconds (1 mGy). COMPLICATIONS: None immediate. PROCEDURE: Informed written consent was obtained from the patient after a thorough discussion of the procedural risks, benefits and alternatives. All questions were addressed. Maximal Sterile Barrier Technique was utilized including caps, mask, sterile gowns, sterile gloves, sterile drape, hand hygiene and skin antiseptic. A timeout was performed prior to the initiation of the procedure. Right neck prepped and draped in the usual sterile fashion. All elements of maximal sterile barrier were utilized including, cap, mask, sterile gown, sterile gloves, large sterile drape, hand scrubbing and 2% Chlorhexidine for skin cleaning. The right internal jugular vein was evaluated with ultrasound and shown to be patent. A permanent ultrasound image was obtained and placed in the patient's medical record. Using sterile gel and a sterile probe cover, the right internal jugular vein was entered with a 21 ga needle during real time ultrasound guidance. 0.018 inch guidewire placed and 21 ga needle exchanged for transitional dilator set. Utilizing fluoroscopy, 0.035 inch guidewire advanced through the needle without difficulty. Serial dilation performed, and catheter inserted over the guidewire. The tip was positioned in the right atrium. All lumens of the catheter aspirated and flushed well. The dialysis lumens were locked with Heparin. The catheter was secured to the skin with suture. The insertion site was covered with sterile dressing. IMPRESSION: Temporary 15 cm right IJ hemodialysis catheter ready for use. Electronically Signed   By: Miachel Roux M.D.   On: 01/20/2022 09:16   IR US Guide Vasc  Access Right  Result Date: 01/19/2022 INDICATION: 87 year old female referred for hemodialysis catheter EXAM: IMAGE GUIDED TEMPORARY HEMODIALYSIS CATHETER PLACEMENT MEDICATIONS: None ANESTHESIA/SEDATION: Moderate (conscious) sedation was not employed during this procedure. Total intra-service moderate Sedation Time: 0 minutes. The patient's level of consciousness and vital signs were monitored continuously by radiology nursing throughout the procedure under my direct supervision. FLUOROSCOPY TIME:  Fluoroscopy Time: 0 minutes 42 seconds (1 mGy). COMPLICATIONS: None PROCEDURE: Informed written consent was obtained from the patient and the patient's family after a thorough discussion of the procedural risks, benefits and alternatives. All questions were addressed. A timeout was performed prior to the initiation of the procedure. The right neck and chest was prepped with chlorhexidine, and draped in the usual sterile fashion using maximum barrier technique (cap and mask, sterile gown, sterile gloves, large sterile sheet, hand hygiene and cutaneous antiseptic). Local anesthesia was attained by infiltration with 1% lidocaine without epinephrine. Ultrasound demonstrated patency of the right internal jugular vein, and this was documented with an image. Under real-time ultrasound guidance, this vein was accessed with a 21 gauge micropuncture needle and image documentation was performed. A small dermatotomy was made at the access site with an 11 scalpel. A 0.018" wire was advanced into the SVC and the access needle exchanged for a 110F micropuncture vascular sheath. The 0.018" wire was then removed and a 0.035" wire advanced into the IVC. Upon withdrawal of the 018 wire, the wire was marked for appropriate length of the internal portion of the catheter. A 15 cm catheter was selected. Skin and subcutaneous tissues were serially dilated. Catheter was placed on the wire. The catheter tip is positioned in the upper right atrium.  This was documented with a spot image. Both ports of the hemodialysis catheter were then tested for excellent function. The ports were then locked with heparinized lock. Patient tolerated the procedure well and remained hemodynamically stable throughout. No complications were encountered and  no significant blood loss was encountered. IMPRESSION: Status post right IJ temporary hemodialysis catheter placement. Signed, Dulcy Fanny. Dellia Nims, RPVI [facility] Radiology Electronically Signed   By: Corrie Mckusick D.O.   On: 01/19/2022 10:08    Cardiac Studies   TTE 01/17/2022 1. Septal and apical hypokinesis EF has decreased since echo done 02/09/20  . Left ventricular ejection fraction, by estimation, is 40 to 45%. The  left ventricle has mildly decreased function. The left ventricle has no  regional wall motion abnormalities.  The left ventricular internal cavity size was mildly dilated. There is  mild asymmetric left ventricular hypertrophy of the basal and septal  segments. Left ventricular diastolic parameters were normal.   2. Right ventricular systolic function is normal. The right ventricular  size is normal.   3. Left atrial size was mildly dilated.   4. The mitral valve is abnormal. Trivial mitral valve regurgitation. No  evidence of mitral stenosis.   5. The aortic valve is tricuspid. There is moderate calcification of the  aortic valve. There is moderate thickening of the aortic valve. Aortic  valve regurgitation is trivial. Aortic valve sclerosis/calcification is  present, without any evidence of  aortic stenosis.   6. The inferior vena cava is normal in size with greater than 50%  respiratory variability, suggesting right atrial pressure of 3 mmHg  Patient Profile     87 y.o. female history CRF, PVD post RLE amputation HTN and CVA admitted with dyspnea and chest pain with evolving SEMI and mildly reduced EF 40-45% with LAD territory  hypokinesis that's new since 02/09/2020, in sinus rhythm  Assessment & Plan    Myocardial Infarction: Patient has evolved an MI, ECG without acute ST elevation  - HSTN peaked at 6368 on 1/22  - Echo on 1/22 showed EF 40-45% (decreased from 60-65%), LAD territory hypokinesis (new when compared to previous echo)  - Originally, as patient has advanced renal failure, is of advanced age, and was experiencing delirium while inpatient, we planned to manage her MI medically and withhold invasive procedures. However, patient and family met with palliative care yesterday. Patient's mentation improved and she was able to express her desires for HD and cardiac interventions. Dr. Harl Bowie discussed LHC with the patient and she consented to the procedure. See Dr. Harl Bowie note 1/23 for consent.  - s/p LHC today, CTO of her right with L-R collaterals. Small OM1 80s lesion. Plan is for medical management - Continue DAPT with ASA, plavix  - Continue BB, restart home statin   HTN crisis  - Presented with SBP in the 200s, slowly improving, current SBP in 160s  - On nitro drip, hydralazine 51m TID, metoprolol tartrate 25 mg Q6hrs - Currently holding home amlodipine, clonidine, benicar.  - Will plan to adjust BP meds following LHC. Ideally, patient will be able to tolerate daily/BID medications rather than medications that are given 3-4 times daily as this will help with compliance.    CKD stage V  - IR placed temp perm, patient removed; was replaced - ihd per nephrology  For questions or updates, please contact [place]Please consult [email or web address] for contact info under        Signed, BJanina Mayo MD  01/20/2022, 12:54 PM

## 2022-01-20 NOTE — Interval H&P Note (Signed)
Cath Lab Visit (complete for each Cath Lab visit)  Clinical Evaluation Leading to the Procedure:   ACS: Yes.    Non-ACS:    Anginal Classification: CCS IV  Anti-ischemic medical therapy: Maximal Therapy (2 or more classes of medications)  Non-Invasive Test Results: No non-invasive testing performed  Prior CABG: No previous CABG      History and Physical Interval Note:  01/20/2022 11:29 AM  Monique Cabrera  has presented today for surgery, with the diagnosis of chest pain.  The various methods of treatment have been discussed with the patient and family. After consideration of risks, benefits and other options for treatment, the patient has consented to  Procedure(s): LEFT HEART CATH AND CORONARY ANGIOGRAPHY (N/A) as a surgical intervention.  The patient's history has been reviewed, patient examined, no change in status, stable for surgery.  I have reviewed the patient's chart and labs.  Questions were answered to the patient's satisfaction.     Shelva Majestic

## 2022-01-20 NOTE — Progress Notes (Addendum)
Bleeding noted at HD cath insertion site.  Pressure applied x 5 minutes and dressing reinforced with gauze and tape.    Dr. Dwaine Gale (intervention radiology) paged, awaiting call back.  Spoke to [employer], Therapist, sports (IR) to have one of the PAs will come assess

## 2022-01-20 NOTE — Progress Notes (Signed)
PROGRESS NOTE    Monique Cabrera  PYK:[id number] DOB: 1935-10-30 DOA: 01/15/2022 PCP: Wardell Honour, MD    Chief Complaint  Patient presents with   Shortness of Breath    Brief Narrative:  87 yo female with hx of diastolic CHF, CKD stage 5(followed by [facility]), type 2 DM, HTN, s/p right BKA, presented to ER with CP and worsening SOB  well over 3 months, which has been progressively worsening. She denies any lower extremity edema. In the ER, pt noted to hypertensive with SBP >200. Pt with difficult IV access. Took several hours to establish PIV. Started on IV ntg gtts. Labs showed troponin 191 --> 244. Cardiology consulted. Scr 7.5 BUN 51. Bicarb 18, BNP >4500. Due to malignant HTN, elevated troponin, CXR consistent with CHF, [facility] consulted for admission.  Assessment & Plan:   Principal Problem:   Malignant hypertension Active Problems:   Type 2 diabetes mellitus with renal complication (HCC)   Status post below-knee amputation of right lower extremity (HCC)   Chronic kidney disease (CKD), stage V (HCC)   Anemia of chronic renal failure   Elevated troponin   Pleural effusion, bilateral   Acute on chronic diastolic CHF (congestive heart failure) (HCC)   DNR (do not resuscitate)/DNI(Do Not Intubate)   Non-ST elevation (NSTEMI) myocardial infarction ([place])   Hypertensive crisis:  BP parameters have improved.   NSTEMI:  Echo REVIEWED.  Cardiology consulted, and plan for cath today.  Continue with aspirin and plavix, BB and statin.     Stage V CKD with Uremic symptoms -  plan for HD today.  Temp HD cath placed by IR removed by the patient.  Underwent new HD cath today.    Anemia of chronic disease:  Transfuse to keep hemoglobin greater than 7.    Hyponatremia probably from CKD.       Type 2 DM:  CBG (last 3)  Recent Labs    01/20/22 0749 01/20/22 1307 01/20/22 1529  GLUCAP 142* 140* 163*   Resume SSI.  Hemoglobin A1c is 5.7%. resume  home meds.      DVT prophylaxis: heparin.  Code Status: full code.  Family Communication: none at bedside, will call sonand update.  Disposition:   Status is: Inpatient  Remains inpatient appropriate because: further work up with HD and cath.        Consultants:  Cardiology Nephrology.   Procedures: cadiac cath HD.   Antimicrobials: none.    Subjective: Wants to know when she can eat.   Objective: Vitals:   01/20/22 1401 01/20/22 1405 01/20/22 1429 01/20/22 1436  BP:  (!) 154/72 (!) 162/91 (!) 161/85  Pulse:  88 89 88  Resp: (!) 21  19 20   Temp:    (!) 96.9 F (36.1 C)  TempSrc:    Temporal  SpO2:  100% 100% 100%  Weight:   61.3 kg   Height:        Intake/Output Summary (Last 24 hours) at 01/20/2022 1714 Last data filed at 01/20/2022 0651 Gross per 24 hour  Intake 637.59 ml  Output --  Net 637.59 ml   Filed Weights   01/18/22 0446 01/19/22 0413 01/20/22 1429  Weight: 60.7 kg 61.2 kg 61.3 kg    Examination:  General exam: Appears calm and comfortable  Respiratory system: Clear to auscultation. Respiratory effort normal. Cardiovascular system: S1 & S2 heard, RRR. No JVD,  No pedal edema. Gastrointestinal system: Abdomen is nondistended, soft and nontender.  Normal bowel sounds  heard. Central nervous system: Alert and oriented. No focal neurological deficits. Extremities: Symmetric 5 x 5 power. Skin: No rashes, lesions or ulcers Psychiatry: Mood & affect appropriate.     Data Reviewed: I have personally reviewed following labs and imaging studies  CBC: Recent Labs  Lab 01/16/22 0222 01/17/22 0249 01/18/22 0344 01/19/22 0253 01/20/22 0546  WBC 7.7 7.9 11.3* 10.3 8.6  NEUTROABS 5.6 5.6 9.0* 8.1* 6.4  HGB 9.0* 9.0* 8.7* 8.4* 8.3*  HCT 28.3* 28.7* 26.5* 25.1* 24.9*  MCV 96.3 97.6 95.3 93.3 94.3  PLT 210 192 204 196 188    Basic Metabolic Panel: Recent Labs  Lab 01/16/22 0222 01/17/22 0249 01/18/22 0344 01/19/22 0253 01/20/22 0546   NA 137 138 135 133* 129*  K 4.3 5.1 4.7 4.3 4.4  CL 106 106 105 103 99  CO2 17* 15* 16* 16* 15*  GLUCOSE 103* 202* 164* 137* 140*  BUN 56* 67* 78* 71* 78*  CREATININE 7.59* 8.12* 8.45* 9.05* 9.23*  CALCIUM 8.7* 9.1 8.9 8.9 8.9  MG 1.9  --   --   --   --   PHOS  --  9.8* 9.0* 8.4* 8.3*    GFR: Estimated Creatinine Clearance: 4.1 mL/min (A) (by C-G formula based on SCr of 9.23 mg/dL (H)).  Liver Function Tests: Recent Labs  Lab 01/15/22 1631 01/16/22 0222 01/17/22 0249 01/18/22 0344 01/19/22 0253 01/20/22 0546  AST 23 19  --   --   --   --   ALT 17 11  --   --   --   --   ALKPHOS 53 47  --   --   --   --   BILITOT 1.0 0.7  --   --   --   --   PROT 6.5 5.4*  --   --   --   --   ALBUMIN 3.0* 2.6* 2.7* 2.8* 2.8* 2.7*    CBG: Recent Labs  Lab 01/20/22 0038 01/20/22 0448 01/20/22 0749 01/20/22 1307 01/20/22 1529  GLUCAP 131* 134* 142* 140* 163*     Recent Results (from the past 240 hour(s))  Resp Panel by RT-PCR (Flu A&B, Covid) Nasopharyngeal Swab     Status: None   Collection Time: 01/15/22  1:36 PM   Specimen: Nasopharyngeal Swab; Nasopharyngeal(NP) swabs in vial transport medium  Result Value Ref Range Status   SARS Coronavirus 2 by RT PCR NEGATIVE NEGATIVE Final    Comment: (NOTE) SARS-CoV-2 target nucleic acids are NOT DETECTED.  The SARS-CoV-2 RNA is generally detectable in upper respiratory specimens during the acute phase of infection. The lowest concentration of SARS-CoV-2 viral copies this assay can detect is 138 copies/mL. A negative result does not preclude SARS-Cov-2 infection and should not be used as the sole basis for treatment or other patient management decisions. A negative result may occur with  improper specimen collection/handling, submission of specimen other than nasopharyngeal swab, presence of viral mutation(s) within the areas targeted by this assay, and inadequate number of viral copies(<138 copies/mL). A negative result must be  combined with clinical observations, patient history, and epidemiological information. The expected result is Negative.  Fact Sheet for Patients:  EntrepreneurPulse.com.au  Fact Sheet for Healthcare Providers:  IncredibleEmployment.be  This test is no t yet approved or cleared by the [place] FDA and  has been authorized for detection and/or diagnosis of SARS-CoV-2 by FDA under an Emergency Use Authorization (EUA). This EUA will remain  in effect (meaning this test can  be used) for the duration of the COVID-19 declaration under Section 564(b)(1) of the Act, 21 U.S.C.section 360bbb-3(b)(1), unless the authorization is terminated  or revoked sooner.       Influenza A by PCR NEGATIVE NEGATIVE Final   Influenza B by PCR NEGATIVE NEGATIVE Final    Comment: (NOTE) The Xpert Xpress SARS-CoV-2/FLU/RSV plus assay is intended as an aid in the diagnosis of influenza from Nasopharyngeal swab specimens and should not be used as a sole basis for treatment. Nasal washings and aspirates are unacceptable for Xpert Xpress SARS-CoV-2/FLU/RSV testing.  Fact Sheet for Patients: EntrepreneurPulse.com.au  Fact Sheet for Healthcare Providers: IncredibleEmployment.be  This test is not yet approved or cleared by the [place] FDA and has been authorized for detection and/or diagnosis of SARS-CoV-2 by FDA under an Emergency Use Authorization (EUA). This EUA will remain in effect (meaning this test can be used) for the duration of the COVID-19 declaration under Section 564(b)(1) of the Act, 21 U.S.C. section 360bbb-3(b)(1), unless the authorization is terminated or revoked.  Performed at [facility], [place] [address] [place], [place] [address]          Radiology Studies: CARDIAC CATHETERIZATION  Result Date: 01/20/2022   Prox LAD to Mid LAD lesion is 50% stenosed.   1st Mrg lesion is 80% stenosed.   RPDA  lesion is 100% stenosed.   Ost RCA lesion is 100% stenosed.   LV end diastolic pressure is moderately elevated. There is evidence for severe multivessel coronary calcification and vessel tortuosity. The LAD has proximal 50% stenosis.  The left circumflex vessel gives rise to a proximal marginal vessel which has focal 80% stenosis on a bend in the vessel; the RCA is totally occluded proximally.  There is extensive left to right collateralization filling the entire RCA up to the near ostial total occlusion. Elevated LVEDP at 26 mm. RECOMMENDATION: Patient is to initiate dialysis today.  Commend optimal blood pressure control and lipid management.  The patient has been on DAPT with aspirin/Plavix and would continue as long as no significant bleeding.   IR Fluoro Guide CV Line Right  Result Date: 01/20/2022 INDICATION: Renal failure EXAM: Ultrasound fluoroscopic guided temporary dialysis catheter placement MEDICATIONS: None ANESTHESIA/SEDATION: None FLUOROSCOPY TIME:  Fluoroscopy Time: 0 minutes 24 seconds (1 mGy). COMPLICATIONS: None immediate. PROCEDURE: Informed written consent was obtained from the patient after a thorough discussion of the procedural risks, benefits and alternatives. All questions were addressed. Maximal Sterile Barrier Technique was utilized including caps, mask, sterile gowns, sterile gloves, sterile drape, hand hygiene and skin antiseptic. A timeout was performed prior to the initiation of the procedure. Right neck prepped and draped in the usual sterile fashion. All elements of maximal sterile barrier were utilized including, cap, mask, sterile gown, sterile gloves, large sterile drape, hand scrubbing and 2% Chlorhexidine for skin cleaning. The right internal jugular vein was evaluated with ultrasound and shown to be patent. A permanent ultrasound image was obtained and placed in the patient's medical record. Using sterile gel and a sterile probe cover, the right internal jugular vein was  entered with a 21 ga needle during real time ultrasound guidance. 0.018 inch guidewire placed and 21 ga needle exchanged for transitional dilator set. Utilizing fluoroscopy, 0.035 inch guidewire advanced through the needle without difficulty. Serial dilation performed, and catheter inserted over the guidewire. The tip was positioned in the right atrium. All lumens of the catheter aspirated and flushed well. The dialysis lumens were locked with Heparin. The catheter  was secured to the skin with suture. The insertion site was covered with sterile dressing. IMPRESSION: Temporary 15 cm right IJ hemodialysis catheter ready for use. Electronically Signed   By: Miachel Roux M.D.   On: 01/20/2022 09:16   IR Fluoro Guide CV Line Right  Result Date: 01/19/2022 INDICATION: 87 year old female referred for hemodialysis catheter EXAM: IMAGE GUIDED TEMPORARY HEMODIALYSIS CATHETER PLACEMENT MEDICATIONS: None ANESTHESIA/SEDATION: Moderate (conscious) sedation was not employed during this procedure. Total intra-service moderate Sedation Time: 0 minutes. The patient's level of consciousness and vital signs were monitored continuously by radiology nursing throughout the procedure under my direct supervision. FLUOROSCOPY TIME:  Fluoroscopy Time: 0 minutes 42 seconds (1 mGy). COMPLICATIONS: None PROCEDURE: Informed written consent was obtained from the patient and the patient's family after a thorough discussion of the procedural risks, benefits and alternatives. All questions were addressed. A timeout was performed prior to the initiation of the procedure. The right neck and chest was prepped with chlorhexidine, and draped in the usual sterile fashion using maximum barrier technique (cap and mask, sterile gown, sterile gloves, large sterile sheet, hand hygiene and cutaneous antiseptic). Local anesthesia was attained by infiltration with 1% lidocaine without epinephrine. Ultrasound demonstrated patency of the right internal jugular  vein, and this was documented with an image. Under real-time ultrasound guidance, this vein was accessed with a 21 gauge micropuncture needle and image documentation was performed. A small dermatotomy was made at the access site with an 11 scalpel. A 0.018" wire was advanced into the SVC and the access needle exchanged for a 31F micropuncture vascular sheath. The 0.018" wire was then removed and a 0.035" wire advanced into the IVC. Upon withdrawal of the 018 wire, the wire was marked for appropriate length of the internal portion of the catheter. A 15 cm catheter was selected. Skin and subcutaneous tissues were serially dilated. Catheter was placed on the wire. The catheter tip is positioned in the upper right atrium. This was documented with a spot image. Both ports of the hemodialysis catheter were then tested for excellent function. The ports were then locked with heparinized lock. Patient tolerated the procedure well and remained hemodynamically stable throughout. No complications were encountered and no significant blood loss was encountered. IMPRESSION: Status post right IJ temporary hemodialysis catheter placement. Signed, Dulcy Fanny. Dellia Nims, RPVI [facility] Radiology Electronically Signed   By: Corrie Mckusick D.O.   On: 01/19/2022 10:08   IR US Guide Vasc Access Right  Result Date: 01/20/2022 INDICATION: Renal failure EXAM: Ultrasound fluoroscopic guided temporary dialysis catheter placement MEDICATIONS: None ANESTHESIA/SEDATION: None FLUOROSCOPY TIME:  Fluoroscopy Time: 0 minutes 24 seconds (1 mGy). COMPLICATIONS: None immediate. PROCEDURE: Informed written consent was obtained from the patient after a thorough discussion of the procedural risks, benefits and alternatives. All questions were addressed. Maximal Sterile Barrier Technique was utilized including caps, mask, sterile gowns, sterile gloves, sterile drape, hand hygiene and skin antiseptic. A  timeout was performed prior to the initiation of the procedure. Right neck prepped and draped in the usual sterile fashion. All elements of maximal sterile barrier were utilized including, cap, mask, sterile gown, sterile gloves, large sterile drape, hand scrubbing and 2% Chlorhexidine for skin cleaning. The right internal jugular vein was evaluated with ultrasound and shown to be patent. A permanent ultrasound image was obtained and placed in the patient's medical record. Using sterile gel and a sterile probe cover, the right internal jugular vein was entered with a 21 ga needle during  real time ultrasound guidance. 0.018 inch guidewire placed and 21 ga needle exchanged for transitional dilator set. Utilizing fluoroscopy, 0.035 inch guidewire advanced through the needle without difficulty. Serial dilation performed, and catheter inserted over the guidewire. The tip was positioned in the right atrium. All lumens of the catheter aspirated and flushed well. The dialysis lumens were locked with Heparin. The catheter was secured to the skin with suture. The insertion site was covered with sterile dressing. IMPRESSION: Temporary 15 cm right IJ hemodialysis catheter ready for use. Electronically Signed   By: Miachel Roux M.D.   On: 01/20/2022 09:16   IR US Guide Vasc Access Right  Result Date: 01/19/2022 INDICATION: 87 year old female referred for hemodialysis catheter EXAM: IMAGE GUIDED TEMPORARY HEMODIALYSIS CATHETER PLACEMENT MEDICATIONS: None ANESTHESIA/SEDATION: Moderate (conscious) sedation was not employed during this procedure. Total intra-service moderate Sedation Time: 0 minutes. The patient's level of consciousness and vital signs were monitored continuously by radiology nursing throughout the procedure under my direct supervision. FLUOROSCOPY TIME:  Fluoroscopy Time: 0 minutes 42 seconds (1 mGy). COMPLICATIONS: None PROCEDURE: Informed written consent was obtained from the patient and the patient's family  after a thorough discussion of the procedural risks, benefits and alternatives. All questions were addressed. A timeout was performed prior to the initiation of the procedure. The right neck and chest was prepped with chlorhexidine, and draped in the usual sterile fashion using maximum barrier technique (cap and mask, sterile gown, sterile gloves, large sterile sheet, hand hygiene and cutaneous antiseptic). Local anesthesia was attained by infiltration with 1% lidocaine without epinephrine. Ultrasound demonstrated patency of the right internal jugular vein, and this was documented with an image. Under real-time ultrasound guidance, this vein was accessed with a 21 gauge micropuncture needle and image documentation was performed. A small dermatotomy was made at the access site with an 11 scalpel. A 0.018" wire was advanced into the SVC and the access needle exchanged for a 47F micropuncture vascular sheath. The 0.018" wire was then removed and a 0.035" wire advanced into the IVC. Upon withdrawal of the 018 wire, the wire was marked for appropriate length of the internal portion of the catheter. A 15 cm catheter was selected. Skin and subcutaneous tissues were serially dilated. Catheter was placed on the wire. The catheter tip is positioned in the upper right atrium. This was documented with a spot image. Both ports of the hemodialysis catheter were then tested for excellent function. The ports were then locked with heparinized lock. Patient tolerated the procedure well and remained hemodynamically stable throughout. No complications were encountered and no significant blood loss was encountered. IMPRESSION: Status post right IJ temporary hemodialysis catheter placement. Signed, Dulcy Fanny. Dellia Nims, RPVI [facility] Radiology Electronically Signed   By: Corrie Mckusick D.O.   On: 01/19/2022 10:08        Scheduled Meds:  aspirin  81 mg Oral Daily   aspirin EC  81 mg  Oral Daily   atorvastatin  80 mg Oral Daily   calcium acetate  667 mg Oral TID WC   Chlorhexidine Gluconate Cloth  6 each Topical Daily   Chlorhexidine Gluconate Cloth  6 each Topical Q0600   [START ON 01/21/2022] Chlorhexidine Gluconate Cloth  6 each Topical Q0600   [START ON 01/21/2022] clopidogrel  75 mg Oral Q breakfast   darbepoetin (ARANESP) injection - DIALYSIS  100 mcg Intravenous Q Tue-HD   heparin sodium (porcine)       hydrALAZINE  25 mg Oral  Q8H   insulin aspart  0-6 Units Subcutaneous Q4H   lidocaine       metoCLOPramide (REGLAN) injection  5 mg Intravenous TID WC & HS   metoprolol tartrate  25 mg Oral Q6H   pantoprazole (PROTONIX) IV  40 mg Intravenous Q24H   sodium chloride flush  3 mL Intravenous Q12H   sodium chloride flush  3 mL Intravenous Q12H   Continuous Infusions:  sodium chloride     sodium chloride     sodium chloride     heparin Stopped (01/20/22 1102)     LOS: 5 days        Hosie Poisson, MD [facility]   To contact the attending provider between 7A-7P or the covering provider during after hours 7P-7A, please log into the web site [email or web address] and access using universal Echo password for that web site. If you do not have the password, please call the hospital operator.  01/20/2022, 5:14 PM

## 2022-01-20 NOTE — Progress Notes (Signed)
PT Cancellation Note  Patient Details Name: Monique Cabrera MRN: [id number] DOB: 04-14-1935   Cancelled Treatment:    Reason Eval/Treat Not Completed: Medical issues which prohibited therapy  Pt s/p cardiac cath with bedrest until around 17:00.  Will f/u at later date.  Abran Richard, PT Acute Rehab Services Pager [phone] [facility] [place] 01/20/2022, 2:31 PM

## 2022-01-20 NOTE — Progress Notes (Signed)
RN went to assess patient's groin site, bleeding noted at HD cath insertion site and on patient's pillow.  Notified patient's nurse in HD ( Destiny, Therapist, sports).  I also notified and spoke to Dr Dwaine Gale, who states he will come to HD to assess patient shortly.  Obtained CBG on patient while in HD.  CBG= 163

## 2022-01-20 NOTE — Procedures (Signed)
Interventional Radiology Procedure Note  Procedure: Temporary HD catheter.  Ready for use.  Indication: Renal Failure  Findings: Please refer to procedural dictation for full description.  Complications: None  EBL: < 10 mL  Miachel Roux, MD [phone]

## 2022-01-21 ENCOUNTER — Encounter (HOSPITAL_COMMUNITY): Payer: Self-pay | Admitting: Cardiovascular Disease

## 2022-01-21 ENCOUNTER — Inpatient Hospital Stay (HOSPITAL_COMMUNITY): Payer: Medicare Other

## 2022-01-21 DIAGNOSIS — I214 Non-ST elevation (NSTEMI) myocardial infarction: Secondary | ICD-10-CM | POA: Diagnosis not present

## 2022-01-21 DIAGNOSIS — I2510 Atherosclerotic heart disease of native coronary artery without angina pectoris: Secondary | ICD-10-CM | POA: Diagnosis not present

## 2022-01-21 HISTORY — PX: IR FLUORO GUIDE CV LINE RIGHT: IMG2283

## 2022-01-21 LAB — GLUCOSE, CAPILLARY
Glucose-Capillary: 128 mg/dL — ABNORMAL HIGH (ref 70–99)
Glucose-Capillary: 130 mg/dL — ABNORMAL HIGH (ref 70–99)
Glucose-Capillary: 137 mg/dL — ABNORMAL HIGH (ref 70–99)
Glucose-Capillary: 143 mg/dL — ABNORMAL HIGH (ref 70–99)
Glucose-Capillary: 144 mg/dL — ABNORMAL HIGH (ref 70–99)
Glucose-Capillary: 182 mg/dL — ABNORMAL HIGH (ref 70–99)

## 2022-01-21 LAB — CBC
HCT: 25.6 % — ABNORMAL LOW (ref 36.0–46.0)
Hemoglobin: 8.6 g/dL — ABNORMAL LOW (ref 12.0–15.0)
MCH: 31.9 pg (ref 26.0–34.0)
MCHC: 33.6 g/dL (ref 30.0–36.0)
MCV: 94.8 fL (ref 80.0–100.0)
Platelets: 196 10*3/uL (ref 150–400)
RBC: 2.7 MIL/uL — ABNORMAL LOW (ref 3.87–5.11)
RDW: 14.3 % (ref 11.5–15.5)
WBC: 7 10*3/uL (ref 4.0–10.5)
nRBC: 0 % (ref 0.0–0.2)

## 2022-01-21 LAB — PARATHYROID HORMONE, INTACT (NO CA): PTH: 479 pg/mL — ABNORMAL HIGH (ref 15–65)

## 2022-01-21 LAB — CBC WITH DIFFERENTIAL/PLATELET
Abs Immature Granulocytes: 0.07 10*3/uL (ref 0.00–0.07)
Basophils Absolute: 0 10*3/uL (ref 0.0–0.1)
Basophils Relative: 0 %
Eosinophils Absolute: 0.1 10*3/uL (ref 0.0–0.5)
Eosinophils Relative: 1 %
HCT: 26.3 % — ABNORMAL LOW (ref 36.0–46.0)
Hemoglobin: 8.6 g/dL — ABNORMAL LOW (ref 12.0–15.0)
Immature Granulocytes: 1 %
Lymphocytes Relative: 13 %
Lymphs Abs: 0.9 10*3/uL (ref 0.7–4.0)
MCH: 30.8 pg (ref 26.0–34.0)
MCHC: 32.7 g/dL (ref 30.0–36.0)
MCV: 94.3 fL (ref 80.0–100.0)
Monocytes Absolute: 0.8 10*3/uL (ref 0.1–1.0)
Monocytes Relative: 12 %
Neutro Abs: 5.1 10*3/uL (ref 1.7–7.7)
Neutrophils Relative %: 73 %
Platelets: 207 10*3/uL (ref 150–400)
RBC: 2.79 MIL/uL — ABNORMAL LOW (ref 3.87–5.11)
RDW: 14.2 % (ref 11.5–15.5)
WBC: 6.9 10*3/uL (ref 4.0–10.5)
nRBC: 0.4 % — ABNORMAL HIGH (ref 0.0–0.2)

## 2022-01-21 LAB — RENAL FUNCTION PANEL
Albumin: 2.8 g/dL — ABNORMAL LOW (ref 3.5–5.0)
Anion gap: 15 (ref 5–15)
BUN: 80 mg/dL — ABNORMAL HIGH (ref 8–23)
CO2: 16 mmol/L — ABNORMAL LOW (ref 22–32)
Calcium: 9.2 mg/dL (ref 8.9–10.3)
Chloride: 101 mmol/L (ref 98–111)
Creatinine, Ser: 8.87 mg/dL — ABNORMAL HIGH (ref 0.44–1.00)
GFR, Estimated: 4 mL/min — ABNORMAL LOW
Glucose, Bld: 128 mg/dL — ABNORMAL HIGH (ref 70–99)
Phosphorus: 7.9 mg/dL — ABNORMAL HIGH (ref 2.5–4.6)
Potassium: 4.3 mmol/L (ref 3.5–5.1)
Sodium: 132 mmol/L — ABNORMAL LOW (ref 135–145)

## 2022-01-21 LAB — HEPATITIS B SURFACE ANTIBODY, QUANTITATIVE: Hep B S AB Quant (Post): 45 m[IU]/mL (ref >9.9)

## 2022-01-21 MED ORDER — ALTEPLASE 2 MG IJ SOLR
INTRAMUSCULAR | Status: AC
  Administered 2022-01-21: 2 mg
  Filled 2022-01-21: qty 4

## 2022-01-21 MED ORDER — PANTOPRAZOLE SODIUM 40 MG PO TBEC
40.0000 mg | DELAYED_RELEASE_TABLET | Freq: Every day | ORAL | Status: DC
  Administered 2022-01-21 – 2022-01-29 (×9): 40 mg via ORAL
  Filled 2022-01-21 (×9): qty 1

## 2022-01-21 MED ORDER — HYDRALAZINE HCL 50 MG PO TABS
50.0000 mg | ORAL_TABLET | Freq: Three times a day (TID) | ORAL | Status: DC
  Administered 2022-01-21 – 2022-01-23 (×6): 50 mg via ORAL
  Filled 2022-01-21 (×6): qty 1

## 2022-01-21 MED ORDER — HEPARIN SODIUM (PORCINE) 1000 UNIT/ML IJ SOLN
INTRAMUSCULAR | Status: AC
  Administered 2022-01-21: 2.6 mL
  Filled 2022-01-21: qty 10

## 2022-01-21 MED ORDER — CALCITRIOL 0.25 MCG PO CAPS
0.2500 ug | ORAL_CAPSULE | Freq: Every day | ORAL | Status: DC
  Administered 2022-01-21 – 2022-01-29 (×9): 0.25 ug via ORAL
  Filled 2022-01-21 (×9): qty 1

## 2022-01-21 MED ORDER — LIDOCAINE HCL 1 % IJ SOLN
INTRAMUSCULAR | Status: AC
  Administered 2022-01-21: 5 mL
  Filled 2022-01-21: qty 20

## 2022-01-21 NOTE — Care Management Important Message (Signed)
Important Message  Patient Details  Name: KARILYNN CARRANZA MRN: [id number] Date of Birth: 1935/08/22   Medicare Important Message Given:  Yes     Shelda Altes 01/21/2022, 11:59 AM

## 2022-01-21 NOTE — Progress Notes (Signed)
Pt tx to [facility]

## 2022-01-21 NOTE — Plan of Care (Signed)
°  Problem: Clinical Measurements: Goal: Ability to maintain clinical measurements within normal limits will improve 01/21/2022 0253 by Colonel Bald, RN Outcome: Progressing 01/21/2022 0252 by Colonel Bald, RN Outcome: Progressing Goal: Will remain free from infection 01/21/2022 0253 by Colonel Bald, RN Outcome: Progressing 01/21/2022 0252 by Colonel Bald, RN Outcome: Progressing Goal: Diagnostic test results will improve 01/21/2022 0253 by Colonel Bald, RN Outcome: Progressing 01/21/2022 0252 by Colonel Bald, RN Outcome: Progressing Goal: Respiratory complications will improve 01/21/2022 0253 by Colonel Bald, RN Outcome: Progressing 01/21/2022 0252 by Colonel Bald, RN Outcome: Progressing Goal: Cardiovascular complication will be avoided 01/21/2022 0253 by Colonel Bald, RN Outcome: Progressing 01/21/2022 0252 by Colonel Bald, RN Outcome: Progressing

## 2022-01-21 NOTE — Procedures (Signed)
Interventional Radiology Procedure Note  Procedure: Right IJ temp HD cath exchange  Complications: None  Estimated Blood Loss: None  Findings: Fluoro shows current non-tunneled catheter to be kinked at neck due to way it was sutured laterally at exit site. Catheter exchanged for new 16 cm, 13 Fr triple lumen Mahurkar catheter. Course straighter at level of venous access with no kinking. All lumens aspirate and flush well. OK to use.  Monique Cabrera. Kathlene Cote, M.D Pager:  [phone]

## 2022-01-21 NOTE — Progress Notes (Signed)
Pt arrived to hemodialysis with clotted dialysis CVC and Cathflo instilled x 3 hour.  Not successful.  Able to aspirate the Activase but arterial side remains very poor push.  Wont allow for adequate hemodialysis.  Dr Moshe Cipro notified.  Pt sent back to the floor with an IR consult.  6E staff aware.

## 2022-01-21 NOTE — Evaluation (Signed)
Physical Therapy Evaluation Patient Details Name: Monique Cabrera MRN: [id number] DOB: December 14, 1935 Today's Date: 01/21/2022  History of Present Illness  Pt is an 87 y/o female admitted secondary to HTN urgency and NSTEMI. Pt is s/p heart cath on 1/25. Pt also with problems with HD cath and to IR on 1/26. PMH includes ESRD on HD, R BKA, dCHF, HTN, and DM.  Clinical Impression  Pt admitted secondary to problem above with deficits below. Pt requiring min A to stand and transfer to/from [facility]. Pt reports slightly increased weakness. Further mobility limited as pt going to IR. Anticipate pt will progress well and will be able to d/c home with HHPT, however, will need to ensure safety with mobility progression. Should pt not progress well, may need SNF. Will continue to follow acutely.        Recommendations for follow up therapy are one component of a multi-disciplinary discharge planning process, led by the attending physician.  Recommendations may be updated based on patient status, additional functional criteria and insurance authorization.  Follow Up Recommendations Home health PT (pending progression)    Assistance Recommended at Discharge Frequent or constant Supervision/Assistance (initially)  Patient can return home with the following  A little help with walking and/or transfers;A little help with bathing/dressing/bathroom;Help with stairs or ramp for entrance;Assist for transportation;Assistance with cooking/housework    Equipment Recommendations Other (comment) (TBD pending progression)  Recommendations for Other Services       Functional Status Assessment Patient has had a recent decline in their functional status and demonstrates the ability to make significant improvements in function in a reasonable and predictable amount of time.     Precautions / Restrictions Precautions Precautions: Fall Precaution Comments: R BKA at baseline Required Braces or Orthoses: Other Brace Other  Brace: RLE prosthetic Restrictions Weight Bearing Restrictions: No      Mobility  Bed Mobility Overal bed mobility: Modified Independent                  Transfers Overall transfer level: Needs assistance Equipment used: 1 person hand held assist Transfers: Sit to/from Stand, Bed to chair/wheelchair/BSC Sit to Stand: Min assist Stand pivot transfers: Min assist         General transfer comment: Min A for safety and steadying to stand and transfer to/from [name]. Further mobility deferred as transport staff present to take pt to IR.    Ambulation/Gait                  Stairs            Wheelchair Mobility    Modified Rankin (Stroke Patients Only)       Balance Overall balance assessment: Needs assistance Sitting-balance support: No upper extremity supported Sitting balance-Leahy Scale: Good     Standing balance support: Single extremity supported Standing balance-Leahy Scale: Poor Standing balance comment: Reliant on at least 1 UE support                             Pertinent Vitals/Pain Pain Assessment Pain Assessment: No/denies pain    Home Living Family/patient expects to be discharged to:: Private residence Living Arrangements: Other relatives (daughter) Available Help at Discharge: Family Type of Home: House Home Access: Stairs to enter   [employer] of Steps:  (threshold)   Home Layout: One level Home Equipment: Conservation officer, nature (2 wheels);Rollator (4 wheels);Shower seat      Prior Function Prior Level of Function :  Independent/Modified Independent             Mobility Comments: Normally independent with use of prosthetic. Will sometimes use rollator       Hand Dominance        Extremity/Trunk Assessment   Upper Extremity Assessment Upper Extremity Assessment: Defer to OT evaluation    Lower Extremity Assessment Lower Extremity Assessment: RLE deficits/detail RLE Deficits / Details: R BKA  at baseline    Cervical / Trunk Assessment Cervical / Trunk Assessment: Normal  Communication   Communication: No difficulties  Cognition Arousal/Alertness: Awake/alert Behavior During Therapy: WFL for tasks assessed/performed Overall Cognitive Status: No family/caregiver present to determine baseline cognitive functioning                                 General Comments: WFL for basic tasks. RN reports hx of confusion        General Comments      Exercises     Assessment/Plan    PT Assessment Patient needs continued PT services  PT Problem List Decreased strength;Decreased balance;Decreased activity tolerance;Decreased mobility;Decreased knowledge of use of DME;Decreased knowledge of precautions       PT Treatment Interventions DME instruction;Stair training;Gait training;Therapeutic activities;Functional mobility training;Therapeutic exercise;Balance training;Patient/family education    PT Goals (Current goals can be found in the Care Plan section)  Acute Rehab PT Goals Patient Stated Goal: to go home PT Goal Formulation: With patient Time For Goal Achievement: 02/04/22 Potential to Achieve Goals: Good    Frequency Min 3X/week     Co-evaluation               AM-PAC PT "6 Clicks" Mobility  Outcome Measure Help needed turning from your back to your side while in a flat bed without using bedrails?: None Help needed moving from lying on your back to sitting on the side of a flat bed without using bedrails?: None Help needed moving to and from a bed to a chair (including a wheelchair)?: A Little Help needed standing up from a chair using your arms (e.g., wheelchair or bedside chair)?: A Little Help needed to walk in hospital room?: A Little Help needed climbing 3-5 steps with a railing? : A Lot 6 Click Score: 19    End of Session Equipment Utilized During Treatment: Gait belt Activity Tolerance: Patient tolerated treatment well Patient left:  in bed;with call bell/phone within reach;Other (comment) (with transport staff present) Nurse Communication: Mobility status PT Visit Diagnosis: Unsteadiness on feet (R26.81);Muscle weakness (generalized) (M62.81)    Time: 7793-9030 PT Time Calculation (min) (ACUTE ONLY): 20 min   Charges:   PT Evaluation $PT Eval Moderate Complexity: 1 Mod          Reuel Derby, PT, DPT  Acute Rehabilitation Services  Pager: [phone] Office: [phone]   Rudean Hitt 01/21/2022, 3:15 PM

## 2022-01-21 NOTE — Progress Notes (Signed)
PROGRESS NOTE    Monique Cabrera  IPJ:[id number] DOB: 1935/06/08 DOA: 01/15/2022 PCP: Wardell Honour, MD    Chief Complaint  Patient presents with   Shortness of Breath    Brief Narrative:  87 yo female with hx of diastolic CHF, CKD stage 5(followed by [facility]), type 2 DM, HTN, s/p right BKA, presented to ER with CP and worsening SOB  well over 3 months, which has been progressively worsening. She denies any lower extremity edema. In the ER, pt noted to hypertensive with SBP >200. Pt with difficult IV access. Took several hours to establish PIV. Started on IV ntg gtts. Labs showed troponin 191 --> 244. Cardiology consulted. Scr 7.5 BUN 51. Bicarb 18, BNP >4500. Due to malignant HTN, elevated troponin, CXR consistent with CHF, [facility] hospital[facility] consulted for admission.  Assessment & Plan:   Principal Problem:   Malignant hypertension Active Problems:   Type 2 diabetes mellitus with renal complication (HCC)   Status post below-knee amputation of right lower extremity (HCC)   Chronic kidney disease (CKD), stage V (HCC)   Anemia of chronic renal failure   Elevated troponin   Pleural effusion, bilateral   Acute on chronic diastolic CHF (congestive heart failure) (HCC)   DNR (do not resuscitate)/DNI(Do Not Intubate)   Non-ST elevation (NSTEMI) myocardial infarction ([place])   Hypertensive crisis:  BP parameters have improved but still not optimal.   NSTEMI:  Echo reviewed.   Cardiology consulted, underwent cath, plan for medical management.  Continue with aspirin and plavix, BB and statin.     Stage V CKD with Uremic symptoms -  plan for HD today.  Temp HD cath placed by IR removed by the patient.  Underwent new HD catheter placement, but not functioning. IR to fix it.    Anemia of chronic disease:  Transfuse to keep hemoglobin greater than 7.    Hyponatremia probably from CKD.       Type 2 DM:  CBG (last 3)  Recent Labs    01/20/22 2347 01/21/22 0537  01/21/22 0758  GLUCAP 128* 130* 137*    Resume SSI.  Hemoglobin A1c is 5.7%. resume home meds.      DVT prophylaxis: heparin.  Code Status: full code.  Family Communication: none at bedside, will call son and update.  Disposition:   Status is: Inpatient  Remains inpatient appropriate because: further work up with HD       Consultants:  Cardiology Nephrology.   Procedures: cadiac cath HD.   Antimicrobials: none.    Subjective: Sleepy, but wakes up and answers questions.   Objective: Vitals:   01/21/22 0539 01/21/22 0800 01/21/22 0802 01/21/22 0845  BP: (!) 161/82 (!) 164/85 (!) 164/85 (!) 163/86  Pulse: 87 81 81   Resp: 18  16 (!) 24  Temp: 97.7 F (36.5 C)  (!) 97.5 F (36.4 C) 97.9 F (36.6 C)  TempSrc: Oral  Oral Axillary  SpO2: 100% 100% 100% 100%  Weight:      Height:        Intake/Output Summary (Last 24 hours) at 01/21/2022 1115 Last data filed at 01/21/2022 0154 Gross per 24 hour  Intake 529.7 ml  Output 150 ml  Net 379.7 ml    Filed Weights   01/19/22 0413 01/20/22 1429 01/21/22 0500  Weight: 61.2 kg 61.3 kg 60.4 kg    Examination:  General exam: Appears calm and comfortable  Respiratory system: Clear to auscultation. Respiratory effort normal. Cardiovascular system: S1 & S2  heard, RRR. No JVD, . No pedal edema. Gastrointestinal system: Abdomen is nondistended, soft and nontender. Normal bowel sounds heard. Central nervous system: Alert and oriented. No focal neurological deficits. Extremities: Symmetric 5 x 5 power. Skin: No rashes, lesions or ulcers Psychiatry: Mood & affect appropriate.       Data Reviewed: I have personally reviewed following labs and imaging studies  CBC: Recent Labs  Lab 01/17/22 0249 01/18/22 0344 01/19/22 0253 01/20/22 0546 01/21/22 0614  WBC 7.9 11.3* 10.3 8.6 6.9  NEUTROABS 5.6 9.0* 8.1* 6.4 5.1  HGB 9.0* 8.7* 8.4* 8.3* 8.6*  HCT 28.7* 26.5* 25.1* 24.9* 26.3*  MCV 97.6 95.3 93.3 94.3 94.3   PLT 192 204 196 205 207     Basic Metabolic Panel: Recent Labs  Lab 01/16/22 0222 01/17/22 0249 01/18/22 0344 01/19/22 0253 01/20/22 0546 01/21/22 0614  NA 137 138 135 133* 129* 132*  K 4.3 5.1 4.7 4.3 4.4 4.3  CL 106 106 105 103 99 101  CO2 17* 15* 16* 16* 15* 16*  GLUCOSE 103* 202* 164* 137* 140* 128*  BUN 56* 67* 78* 71* 78* 80*  CREATININE 7.59* 8.12* 8.45* 9.05* 9.23* 8.87*  CALCIUM 8.7* 9.1 8.9 8.9 8.9 9.2  MG 1.9  --   --   --   --   --   PHOS  --  9.8* 9.0* 8.4* 8.3* 7.9*     GFR: Estimated Creatinine Clearance: 4.2 mL/min (A) (by C-G formula based on SCr of 8.87 mg/dL (H)).  Liver Function Tests: Recent Labs  Lab 01/15/22 1631 01/16/22 0222 01/17/22 0249 01/18/22 0344 01/19/22 0253 01/20/22 0546 01/21/22 0614  AST 23 19  --   --   --   --   --   ALT 17 11  --   --   --   --   --   ALKPHOS 53 47  --   --   --   --   --   BILITOT 1.0 0.7  --   --   --   --   --   PROT 6.5 5.4*  --   --   --   --   --   ALBUMIN 3.0* 2.6* 2.7* 2.8* 2.8* 2.7* 2.8*     CBG: Recent Labs  Lab 01/20/22 1746 01/20/22 1949 01/20/22 2347 01/21/22 0537 01/21/22 0758  GLUCAP 132* 138* 128* 130* 137*      Recent Results (from the past 240 hour(s))  Resp Panel by RT-PCR (Flu A&B, Covid) Nasopharyngeal Swab     Status: None   Collection Time: 01/15/22  1:36 PM   Specimen: Nasopharyngeal Swab; Nasopharyngeal(NP) swabs in vial transport medium  Result Value Ref Range Status   SARS Coronavirus 2 by RT PCR NEGATIVE NEGATIVE Final    Comment: (NOTE) SARS-CoV-2 target nucleic acids are NOT DETECTED.  The SARS-CoV-2 RNA is generally detectable in upper respiratory specimens during the acute phase of infection. The lowest concentration of SARS-CoV-2 viral copies this assay can detect is 138 copies/mL. A negative result does not preclude SARS-Cov-2 infection and should not be used as the sole basis for treatment or other patient management decisions. A negative result may  occur with  improper specimen collection/handling, submission of specimen other than nasopharyngeal swab, presence of viral mutation(s) within the areas targeted by this assay, and inadequate number of viral copies(<138 copies/mL). A negative result must be combined with clinical observations, patient history, and epidemiological information. The expected result is Negative.  Fact Sheet  for Patients:  EntrepreneurPulse.com.au  Fact Sheet for Healthcare Providers:  IncredibleEmployment.be  This test is no t yet approved or cleared by the [place] FDA and  has been authorized for detection and/or diagnosis of SARS-CoV-2 by FDA under an Emergency Use Authorization (EUA). This EUA will remain  in effect (meaning this test can be used) for the duration of the COVID-19 declaration under Section 564(b)(1) of the Act, 21 U.S.C.section 360bbb-3(b)(1), unless the authorization is terminated  or revoked sooner.       Influenza A by PCR NEGATIVE NEGATIVE Final   Influenza B by PCR NEGATIVE NEGATIVE Final    Comment: (NOTE) The Xpert Xpress SARS-CoV-2/FLU/RSV plus assay is intended as an aid in the diagnosis of influenza from Nasopharyngeal swab specimens and should not be used as a sole basis for treatment. Nasal washings and aspirates are unacceptable for Xpert Xpress SARS-CoV-2/FLU/RSV testing.  Fact Sheet for Patients: EntrepreneurPulse.com.au  Fact Sheet for Healthcare Providers: IncredibleEmployment.be  This test is not yet approved or cleared by the [place] FDA and has been authorized for detection and/or diagnosis of SARS-CoV-2 by FDA under an Emergency Use Authorization (EUA). This EUA will remain in effect (meaning this test can be used) for the duration of the COVID-19 declaration under Section 564(b)(1) of the Act, 21 U.S.C. section 360bbb-3(b)(1), unless the authorization is terminated  or revoked.  Performed at [facility], [place] [address] [place], [place] [address]           Radiology Studies: CARDIAC CATHETERIZATION  Result Date: 01/20/2022   Prox LAD to Mid LAD lesion is 50% stenosed.   1st Mrg lesion is 80% stenosed.   RPDA lesion is 100% stenosed.   Ost RCA lesion is 100% stenosed.   LV end diastolic pressure is moderately elevated. There is evidence for severe multivessel coronary calcification and vessel tortuosity. The LAD has proximal 50% stenosis.  The left circumflex vessel gives rise to a proximal marginal vessel which has focal 80% stenosis on a bend in the vessel; the RCA is totally occluded proximally.  There is extensive left to right collateralization filling the entire RCA up to the near ostial total occlusion. Elevated LVEDP at 26 mm. RECOMMENDATION: Patient is to initiate dialysis today.  Commend optimal blood pressure control and lipid management.  The patient has been on DAPT with aspirin/Plavix and would continue as long as no significant bleeding.   IR Fluoro Guide CV Line Right  Result Date: 01/20/2022 INDICATION: Renal failure EXAM: Ultrasound fluoroscopic guided temporary dialysis catheter placement MEDICATIONS: None ANESTHESIA/SEDATION: None FLUOROSCOPY TIME:  Fluoroscopy Time: 0 minutes 24 seconds (1 mGy). COMPLICATIONS: None immediate. PROCEDURE: Informed written consent was obtained from the patient after a thorough discussion of the procedural risks, benefits and alternatives. All questions were addressed. Maximal Sterile Barrier Technique was utilized including caps, mask, sterile gowns, sterile gloves, sterile drape, hand hygiene and skin antiseptic. A timeout was performed prior to the initiation of the procedure. Right neck prepped and draped in the usual sterile fashion. All elements of maximal sterile barrier were utilized including, cap, mask, sterile gown, sterile gloves, large sterile drape, hand scrubbing and 2% Chlorhexidine for  skin cleaning. The right internal jugular vein was evaluated with ultrasound and shown to be patent. A permanent ultrasound image was obtained and placed in the patient's medical record. Using sterile gel and a sterile probe cover, the right internal jugular vein was entered with a 21 ga needle during real time ultrasound guidance. 0.018  inch guidewire placed and 21 ga needle exchanged for transitional dilator set. Utilizing fluoroscopy, 0.035 inch guidewire advanced through the needle without difficulty. Serial dilation performed, and catheter inserted over the guidewire. The tip was positioned in the right atrium. All lumens of the catheter aspirated and flushed well. The dialysis lumens were locked with Heparin. The catheter was secured to the skin with suture. The insertion site was covered with sterile dressing. IMPRESSION: Temporary 15 cm right IJ hemodialysis catheter ready for use. Electronically Signed   By: Miachel Roux M.D.   On: 01/20/2022 09:16   IR US Guide Vasc Access Right  Result Date: 01/20/2022 INDICATION: Renal failure EXAM: Ultrasound fluoroscopic guided temporary dialysis catheter placement MEDICATIONS: None ANESTHESIA/SEDATION: None FLUOROSCOPY TIME:  Fluoroscopy Time: 0 minutes 24 seconds (1 mGy). COMPLICATIONS: None immediate. PROCEDURE: Informed written consent was obtained from the patient after a thorough discussion of the procedural risks, benefits and alternatives. All questions were addressed. Maximal Sterile Barrier Technique was utilized including caps, mask, sterile gowns, sterile gloves, sterile drape, hand hygiene and skin antiseptic. A timeout was performed prior to the initiation of the procedure. Right neck prepped and draped in the usual sterile fashion. All elements of maximal sterile barrier were utilized including, cap, mask, sterile gown, sterile gloves, large sterile drape, hand scrubbing and 2% Chlorhexidine for skin cleaning. The right internal jugular vein was  evaluated with ultrasound and shown to be patent. A permanent ultrasound image was obtained and placed in the patient's medical record. Using sterile gel and a sterile probe cover, the right internal jugular vein was entered with a 21 ga needle during real time ultrasound guidance. 0.018 inch guidewire placed and 21 ga needle exchanged for transitional dilator set. Utilizing fluoroscopy, 0.035 inch guidewire advanced through the needle without difficulty. Serial dilation performed, and catheter inserted over the guidewire. The tip was positioned in the right atrium. All lumens of the catheter aspirated and flushed well. The dialysis lumens were locked with Heparin. The catheter was secured to the skin with suture. The insertion site was covered with sterile dressing. IMPRESSION: Temporary 15 cm right IJ hemodialysis catheter ready for use. Electronically Signed   By: Miachel Roux M.D.   On: 01/20/2022 09:16        Scheduled Meds:  aspirin  81 mg Oral Daily   aspirin EC  81 mg Oral Daily   atorvastatin  80 mg Oral Daily   calcitRIOL  0.25 mcg Oral Daily   calcium acetate  667 mg Oral TID WC   Chlorhexidine Gluconate Cloth  6 each Topical Daily   Chlorhexidine Gluconate Cloth  6 each Topical Q0600   Chlorhexidine Gluconate Cloth  6 each Topical Q0600   clopidogrel  75 mg Oral Q breakfast   darbepoetin (ARANESP) injection - DIALYSIS  100 mcg Intravenous Q Tue-HD   hydrALAZINE  25 mg Oral Q8H   insulin aspart  0-6 Units Subcutaneous Q4H   metoCLOPramide (REGLAN) injection  5 mg Intravenous TID WC & HS   metoprolol tartrate  25 mg Oral Q6H   pantoprazole (PROTONIX) IV  40 mg Intravenous Q24H   sodium chloride flush  3 mL Intravenous Q12H   sodium chloride flush  3 mL Intravenous Q12H   Continuous Infusions:  sodium chloride       LOS: 6 days        Hosie Poisson, MD [facility]   To contact the attending provider between 7A-7P or the covering provider during after hours  7P-7A,  please log into the web site [email or web address] and access using universal [place] password for that web site. If you do not have the password, please call the hospital operator.  01/21/2022, 11:15 AM

## 2022-01-21 NOTE — Plan of Care (Signed)

## 2022-01-21 NOTE — Progress Notes (Signed)
Subjective:  pt had heart cath-  found blockages but has collaterals-  plan is for medical management-  attempted to do HD late yesterday but cath was not functional -  felt to be due to swelling due to bleeding and trauma -  plan was to let it rest and try today -- unfortunately it did not function today either-  have contacted IR who will try to fix   Objective Vital signs in last 24 hours: Vitals:   01/21/22 0539 01/21/22 0800 01/21/22 0802 01/21/22 0845  BP: (!) 161/82 (!) 164/85 (!) 164/85 (!) 163/86  Pulse: 87 81 81   Resp: 18  16 (!) 24  Temp: 97.7 F (36.5 C)  (!) 97.5 F (36.4 C) 97.9 F (36.6 C)  TempSrc: Oral  Oral Axillary  SpO2: 100% 100% 100% 100%  Weight:      Height:       Weight change:   Intake/Output Summary (Last 24 hours) at 01/21/2022 1057 Last data filed at 01/21/2022 0154 Gross per 24 hour  Intake 529.7 ml  Output 150 ml  Net 379.7 ml    Assessment/ Plan: Pt is a 87 y.o. yo female with known advanced CKD who was admitted on 01/15/2022 with acute coronary event  Assessment/Plan: 1. Acute coronary event-  cath done-  showed blockage but collaterals-  nothing to intervene upon-  medical management -  pt seems more stable in that regard 2. Renal-  apparently had been asked several times as OP if she wanted dialysis and she always declined even when faced with decision that she could die without HD -  this is documented in the [place] record.  Palliative care meeting took place on 1/23 and decision was made to go ahead and pursue HD wanting to do what we could to prolong life.  Now has temp HD cath in place after removing the first-but unfortunately it has not been functional for [place]-   have call into IR to fix so we can proceed with hd-  I really do feel like she will be better if we can get her BUN down   3. Anemia-  hgb dropping on heparin -   iron stores OK -  have added on esa   4. HTN/volume-  hypertensive-  volume seems OK-  on nitro/hydralazine/lopressor -   volume does not seem to be a major player at this time  5. Bones- started phoslo- PTH 479-  will start vitamin D      Louis Meckel    Labs: Basic Metabolic Panel: Recent Labs  Lab 01/19/22 0253 01/20/22 0546 01/21/22 0614  NA 133* 129* 132*  K 4.3 4.4 4.3  CL 103 99 101  CO2 16* 15* 16*  GLUCOSE 137* 140* 128*  BUN 71* 78* 80*  CREATININE 9.05* 9.23* 8.87*  CALCIUM 8.9 8.9 9.2  PHOS 8.4* 8.3* 7.9*   Liver Function Tests: Recent Labs  Lab 01/15/22 1631 01/16/22 0222 01/17/22 0249 01/19/22 0253 01/20/22 0546 01/21/22 0614  AST 23 19  --   --   --   --   ALT 17 11  --   --   --   --   ALKPHOS 53 47  --   --   --   --   BILITOT 1.0 0.7  --   --   --   --   PROT 6.5 5.4*  --   --   --   --   ALBUMIN 3.0* 2.6*   < >  2.8* 2.7* 2.8*   < > = values in this interval not displayed.   Recent Labs  Lab 01/17/22 1105  LIPASE 23   No results for input(s): AMMONIA in the last 168 hours. CBC: Recent Labs  Lab 01/17/22 0249 01/18/22 0344 01/19/22 0253 01/20/22 0546 01/21/22 0614  WBC 7.9 11.3* 10.3 8.6 6.9  NEUTROABS 5.6 9.0* 8.1* 6.4 5.1  HGB 9.0* 8.7* 8.4* 8.3* 8.6*  HCT 28.7* 26.5* 25.1* 24.9* 26.3*  MCV 97.6 95.3 93.3 94.3 94.3  PLT 192 204 196 205 207   Cardiac Enzymes: No results for input(s): CKTOTAL, CKMB, CKMBINDEX, TROPONINI in the last 168 hours. CBG: Recent Labs  Lab 01/20/22 1746 01/20/22 1949 01/20/22 2347 01/21/22 0537 01/21/22 0758  GLUCAP 132* 138* 128* 130* 137*    Iron Studies:  Recent Labs    01/20/22 0546  IRON 73  TIBC 200*  FERRITIN 2,430*   Studies/Results: CARDIAC CATHETERIZATION  Result Date: 01/20/2022   Prox LAD to Mid LAD lesion is 50% stenosed.   1st Mrg lesion is 80% stenosed.   RPDA lesion is 100% stenosed.   Ost RCA lesion is 100% stenosed.   LV end diastolic pressure is moderately elevated. There is evidence for severe multivessel coronary calcification and vessel tortuosity. The LAD has proximal 50%  stenosis.  The left circumflex vessel gives rise to a proximal marginal vessel which has focal 80% stenosis on a bend in the vessel; the RCA is totally occluded proximally.  There is extensive left to right collateralization filling the entire RCA up to the near ostial total occlusion. Elevated LVEDP at 26 mm. RECOMMENDATION: Patient is to initiate dialysis today.  Commend optimal blood pressure control and lipid management.  The patient has been on DAPT with aspirin/Plavix and would continue as long as no significant bleeding.   IR Fluoro Guide CV Line Right  Result Date: 01/20/2022 INDICATION: Renal failure EXAM: Ultrasound fluoroscopic guided temporary dialysis catheter placement MEDICATIONS: None ANESTHESIA/SEDATION: None FLUOROSCOPY TIME:  Fluoroscopy Time: 0 minutes 24 seconds (1 mGy). COMPLICATIONS: None immediate. PROCEDURE: Informed written consent was obtained from the patient after a thorough discussion of the procedural risks, benefits and alternatives. All questions were addressed. Maximal Sterile Barrier Technique was utilized including caps, mask, sterile gowns, sterile gloves, sterile drape, hand hygiene and skin antiseptic. A timeout was performed prior to the initiation of the procedure. Right neck prepped and draped in the usual sterile fashion. All elements of maximal sterile barrier were utilized including, cap, mask, sterile gown, sterile gloves, large sterile drape, hand scrubbing and 2% Chlorhexidine for skin cleaning. The right internal jugular vein was evaluated with ultrasound and shown to be patent. A permanent ultrasound image was obtained and placed in the patient's medical record. Using sterile gel and a sterile probe cover, the right internal jugular vein was entered with a 21 ga needle during real time ultrasound guidance. 0.018 inch guidewire placed and 21 ga needle exchanged for transitional dilator set. Utilizing fluoroscopy, 0.035 inch guidewire advanced through the needle  without difficulty. Serial dilation performed, and catheter inserted over the guidewire. The tip was positioned in the right atrium. All lumens of the catheter aspirated and flushed well. The dialysis lumens were locked with Heparin. The catheter was secured to the skin with suture. The insertion site was covered with sterile dressing. IMPRESSION: Temporary 15 cm right IJ hemodialysis catheter ready for use. Electronically Signed   By: Miachel Roux M.D.   On: 01/20/2022 09:16   IR US  Guide Vasc Access Right  Result Date: 01/20/2022 INDICATION: Renal failure EXAM: Ultrasound fluoroscopic guided temporary dialysis catheter placement MEDICATIONS: None ANESTHESIA/SEDATION: None FLUOROSCOPY TIME:  Fluoroscopy Time: 0 minutes 24 seconds (1 mGy). COMPLICATIONS: None immediate. PROCEDURE: Informed written consent was obtained from the patient after a thorough discussion of the procedural risks, benefits and alternatives. All questions were addressed. Maximal Sterile Barrier Technique was utilized including caps, mask, sterile gowns, sterile gloves, sterile drape, hand hygiene and skin antiseptic. A timeout was performed prior to the initiation of the procedure. Right neck prepped and draped in the usual sterile fashion. All elements of maximal sterile barrier were utilized including, cap, mask, sterile gown, sterile gloves, large sterile drape, hand scrubbing and 2% Chlorhexidine for skin cleaning. The right internal jugular vein was evaluated with ultrasound and shown to be patent. A permanent ultrasound image was obtained and placed in the patient's medical record. Using sterile gel and a sterile probe cover, the right internal jugular vein was entered with a 21 ga needle during real time ultrasound guidance. 0.018 inch guidewire placed and 21 ga needle exchanged for transitional dilator set. Utilizing fluoroscopy, 0.035 inch guidewire advanced through the needle without difficulty. Serial dilation performed, and  catheter inserted over the guidewire. The tip was positioned in the right atrium. All lumens of the catheter aspirated and flushed well. The dialysis lumens were locked with Heparin. The catheter was secured to the skin with suture. The insertion site was covered with sterile dressing. IMPRESSION: Temporary 15 cm right IJ hemodialysis catheter ready for use. Electronically Signed   By: Miachel Roux M.D.   On: 01/20/2022 09:16   Medications: Infusions:  sodium chloride      Scheduled Medications:  aspirin  81 mg Oral Daily   aspirin EC  81 mg Oral Daily   atorvastatin  80 mg Oral Daily   calcium acetate  667 mg Oral TID WC   Chlorhexidine Gluconate Cloth  6 each Topical Daily   Chlorhexidine Gluconate Cloth  6 each Topical Q0600   Chlorhexidine Gluconate Cloth  6 each Topical Q0600   clopidogrel  75 mg Oral Q breakfast   darbepoetin (ARANESP) injection - DIALYSIS  100 mcg Intravenous Q Tue-HD   hydrALAZINE  25 mg Oral Q8H   insulin aspart  0-6 Units Subcutaneous Q4H   metoCLOPramide (REGLAN) injection  5 mg Intravenous TID WC & HS   metoprolol tartrate  25 mg Oral Q6H   pantoprazole (PROTONIX) IV  40 mg Intravenous Q24H   sodium chloride flush  3 mL Intravenous Q12H   sodium chloride flush  3 mL Intravenous Q12H    have reviewed scheduled and prn medications.  Physical Exam: General: thin, mittens off -  less agitated-  did not remember that she had heart cath  Heart: tachy  Lungs: mostly clear Abdomen: soft, non tender Extremities: no significant peripheral edema  New right sided temp HD cath placed 1/24 -  pt removed-  new one placed 1/25 -  bandaged     01/21/2022,10:57 AM  LOS: 6 days

## 2022-01-21 NOTE — Procedures (Signed)
Unable to provide Hemodialysis to pt as the vascath is not working properly.  Instilled TPA in arterial and venous ports.  Plan will be to allow time for TPA to work and then to bring pt back to Hemodialysis in order to give her a treatment in a few hours.  Moshe Cipro MD is aware of plan.

## 2022-01-21 NOTE — Progress Notes (Signed)
Progress Note  Patient Name: Monique Cabrera Date of Encounter: 01/21/2022  [name] Cardiologist: Kathlyn Sacramento, MD   Subjective   She is not as conversive. Mentation waxes and wanes per nursing. She did not tolerate completing IHD  Inpatient Medications    Scheduled Meds:  aspirin  81 mg Oral Daily   aspirin EC  81 mg Oral Daily   atorvastatin  80 mg Oral Daily   calcium acetate  667 mg Oral TID WC   Chlorhexidine Gluconate Cloth  6 each Topical Daily   Chlorhexidine Gluconate Cloth  6 each Topical Q0600   Chlorhexidine Gluconate Cloth  6 each Topical Q0600   clopidogrel  75 mg Oral Q breakfast   darbepoetin (ARANESP) injection - DIALYSIS  100 mcg Intravenous Q Tue-HD   hydrALAZINE  25 mg Oral Q8H   insulin aspart  0-6 Units Subcutaneous Q4H   metoCLOPramide (REGLAN) injection  5 mg Intravenous TID WC & HS   metoprolol tartrate  25 mg Oral Q6H   pantoprazole (PROTONIX) IV  40 mg Intravenous Q24H   sodium chloride flush  3 mL Intravenous Q12H   sodium chloride flush  3 mL Intravenous Q12H   Continuous Infusions:  sodium chloride     PRN Meds: sodium chloride, acetaminophen, fentaNYL (SUBLIMAZE) injection, metoprolol tartrate, ondansetron (ZOFRAN) IV, ondansetron **OR** [DISCONTINUED] ondansetron (ZOFRAN) IV, ondansetron, promethazine (PHENERGAN) injection (IM or IVPB), sodium chloride flush   Vital Signs    Vitals:   01/21/22 0539 01/21/22 0800 01/21/22 0802 01/21/22 0845  BP: (!) 161/82 (!) 164/85 (!) 164/85 (!) 163/86  Pulse: 87 81 81   Resp: 18  16 (!) 24  Temp: 97.7 F (36.5 C)  (!) 97.5 F (36.4 C) 97.9 F (36.6 C)  TempSrc: Oral  Oral Axillary  SpO2: 100% 100% 100% 100%  Weight:      Height:        Intake/Output Summary (Last 24 hours) at 01/21/2022 1102 Last data filed at 01/21/2022 0154 Gross per 24 hour  Intake 529.7 ml  Output 150 ml  Net 379.7 ml   Last 3 Weights 01/21/2022 01/20/2022 01/19/2022  Weight (lbs) 133 lb 2.5 oz 135 lb 2.3 oz  134 lb 14.7 oz  Weight (kg) 60.4 kg 61.3 kg 61.2 kg      Telemetry    Sinus rhythm- Personally Reviewed  ECG    No new tracings - Personally Reviewed  Physical Exam   GEN: well appearing Neck: No JVD Cardiac: RRR, no murmurs, rubs, or gallops.  Respiratory: Anterior lung exam unlabored GI: Soft, nontender, non-distended  MS: No edema; Right BKA Neuro:  Nonfocal, Alert and oriented x3  Psych: Normal affect   Labs    High Sensitivity Troponin:   Recent Labs  Lab 01/15/22 1818 01/16/22 0457 01/16/22 0700 01/16/22 2148 01/17/22 0249  TROPONINIHS 244* 578* 449* 3,297* 6,368*     Chemistry Recent Labs  Lab 01/15/22 1631 01/16/22 0222 01/17/22 0249 01/19/22 0253 01/20/22 0546 01/21/22 0614  NA 137 137   < > 133* 129* 132*  K 4.2 4.3   < > 4.3 4.4 4.3  CL 104 106   < > 103 99 101  CO2 18* 17*   < > 16* 15* 16*  GLUCOSE 115* 103*   < > 137* 140* 128*  BUN 51* 56*   < > 71* 78* 80*  CREATININE 7.50* 7.59*   < > 9.05* 9.23* 8.87*  CALCIUM 9.3 8.7*   < > 8.9  8.9 9.2  MG  --  1.9  --   --   --   --   PROT 6.5 5.4*  --   --   --   --   ALBUMIN 3.0* 2.6*   < > 2.8* 2.7* 2.8*  AST 23 19  --   --   --   --   ALT 17 11  --   --   --   --   ALKPHOS 53 47  --   --   --   --   BILITOT 1.0 0.7  --   --   --   --   GFRNONAA 5* 5*   < > 4* 4* 4*  ANIONGAP 15 14   < > '14 15 15   ' < > = values in this interval not displayed.    Lipids  Recent Labs  Lab 01/17/22 0249  CHOL 160  TRIG 82  HDL 61  LDLCALC 83  CHOLHDL 2.6    Hematology Recent Labs  Lab 01/19/22 0253 01/20/22 0546 01/21/22 0614  WBC 10.3 8.6 6.9  RBC 2.69* 2.64* 2.79*  HGB 8.4* 8.3* 8.6*  HCT 25.1* 24.9* 26.3*  MCV 93.3 94.3 94.3  MCH 31.2 31.4 30.8  MCHC 33.5 33.3 32.7  RDW 13.8 14.0 14.2  PLT 196 205 207   Thyroid No results for input(s): TSH, FREET4 in the last 168 hours.  BNP Recent Labs  Lab 01/15/22 1818  BNP >4,500.0*    DDimer No results for input(s): DDIMER in the last 168  hours.   Radiology    CARDIAC CATHETERIZATION  Result Date: 01/20/2022   Prox LAD to Mid LAD lesion is 50% stenosed.   1st Mrg lesion is 80% stenosed.   RPDA lesion is 100% stenosed.   Ost RCA lesion is 100% stenosed.   LV end diastolic pressure is moderately elevated. There is evidence for severe multivessel coronary calcification and vessel tortuosity. The LAD has proximal 50% stenosis.  The left circumflex vessel gives rise to a proximal marginal vessel which has focal 80% stenosis on a bend in the vessel; the RCA is totally occluded proximally.  There is extensive left to right collateralization filling the entire RCA up to the near ostial total occlusion. Elevated LVEDP at 26 mm. RECOMMENDATION: Patient is to initiate dialysis today.  Commend optimal blood pressure control and lipid management.  The patient has been on DAPT with aspirin/Plavix and would continue as long as no significant bleeding.   IR Fluoro Guide CV Line Right  Result Date: 01/20/2022 INDICATION: Renal failure EXAM: Ultrasound fluoroscopic guided temporary dialysis catheter placement MEDICATIONS: None ANESTHESIA/SEDATION: None FLUOROSCOPY TIME:  Fluoroscopy Time: 0 minutes 24 seconds (1 mGy). COMPLICATIONS: None immediate. PROCEDURE: Informed written consent was obtained from the patient after a thorough discussion of the procedural risks, benefits and alternatives. All questions were addressed. Maximal Sterile Barrier Technique was utilized including caps, mask, sterile gowns, sterile gloves, sterile drape, hand hygiene and skin antiseptic. A timeout was performed prior to the initiation of the procedure. Right neck prepped and draped in the usual sterile fashion. All elements of maximal sterile barrier were utilized including, cap, mask, sterile gown, sterile gloves, large sterile drape, hand scrubbing and 2% Chlorhexidine for skin cleaning. The right internal jugular vein was evaluated with ultrasound and shown to be patent. A  permanent ultrasound image was obtained and placed in the patient's medical record. Using sterile gel and a sterile probe cover, the right internal jugular  vein was entered with a 21 ga needle during real time ultrasound guidance. 0.018 inch guidewire placed and 21 ga needle exchanged for transitional dilator set. Utilizing fluoroscopy, 0.035 inch guidewire advanced through the needle without difficulty. Serial dilation performed, and catheter inserted over the guidewire. The tip was positioned in the right atrium. All lumens of the catheter aspirated and flushed well. The dialysis lumens were locked with Heparin. The catheter was secured to the skin with suture. The insertion site was covered with sterile dressing. IMPRESSION: Temporary 15 cm right IJ hemodialysis catheter ready for use. Electronically Signed   By: Miachel Roux M.D.   On: 01/20/2022 09:16   IR US Guide Vasc Access Right  Result Date: 01/20/2022 INDICATION: Renal failure EXAM: Ultrasound fluoroscopic guided temporary dialysis catheter placement MEDICATIONS: None ANESTHESIA/SEDATION: None FLUOROSCOPY TIME:  Fluoroscopy Time: 0 minutes 24 seconds (1 mGy). COMPLICATIONS: None immediate. PROCEDURE: Informed written consent was obtained from the patient after a thorough discussion of the procedural risks, benefits and alternatives. All questions were addressed. Maximal Sterile Barrier Technique was utilized including caps, mask, sterile gowns, sterile gloves, sterile drape, hand hygiene and skin antiseptic. A timeout was performed prior to the initiation of the procedure. Right neck prepped and draped in the usual sterile fashion. All elements of maximal sterile barrier were utilized including, cap, mask, sterile gown, sterile gloves, large sterile drape, hand scrubbing and 2% Chlorhexidine for skin cleaning. The right internal jugular vein was evaluated with ultrasound and shown to be patent. A permanent ultrasound image was obtained and placed in  the patient's medical record. Using sterile gel and a sterile probe cover, the right internal jugular vein was entered with a 21 ga needle during real time ultrasound guidance. 0.018 inch guidewire placed and 21 ga needle exchanged for transitional dilator set. Utilizing fluoroscopy, 0.035 inch guidewire advanced through the needle without difficulty. Serial dilation performed, and catheter inserted over the guidewire. The tip was positioned in the right atrium. All lumens of the catheter aspirated and flushed well. The dialysis lumens were locked with Heparin. The catheter was secured to the skin with suture. The insertion site was covered with sterile dressing. IMPRESSION: Temporary 15 cm right IJ hemodialysis catheter ready for use. Electronically Signed   By: Miachel Roux M.D.   On: 01/20/2022 09:16    Cardiac Studies   TTE 01/17/2022 1. Septal and apical hypokinesis EF has decreased since echo done 02/09/20  . Left ventricular ejection fraction, by estimation, is 40 to 45%. The  left ventricle has mildly decreased function. The left ventricle has no  regional wall motion abnormalities.  The left ventricular internal cavity size was mildly dilated. There is  mild asymmetric left ventricular hypertrophy of the basal and septal  segments. Left ventricular diastolic parameters were normal.   2. Right ventricular systolic function is normal. The right ventricular  size is normal.   3. Left atrial size was mildly dilated.   4. The mitral valve is abnormal. Trivial mitral valve regurgitation. No  evidence of mitral stenosis.   5. The aortic valve is tricuspid. There is moderate calcification of the  aortic valve. There is moderate thickening of the aortic valve. Aortic  valve regurgitation is trivial. Aortic valve sclerosis/calcification is  present, without any evidence of  aortic stenosis.   6. The inferior vena cava is normal in size with greater than 50%  respiratory variability, suggesting  right atrial pressure of 3 mmHg  Patient Profile     87 y.o.  female history CRF, PVD post RLE amputation HTN and CVA admitted with dyspnea and chest pain with evolving SEMI and mildly reduced EF 40-45% with LAD territory hypokinesis that's new since 02/09/2020, in sinus rhythm; LHC done which showed RCA CTO with L-R collaterals, 80% OM1, LAD mild disease. Plan for medical management  Assessment & Plan    Myocardial Infarction: Patient has evolved an MI, ECG without acute ST elevation  - HSTN peaked at 6368 on 1/22  - Echo on 1/22 showed EF 40-45% (decreased from 60-65%), LAD territory hypokinesis (new when compared to previous echo)  - Originally, as patient has advanced renal failure, is of advanced age, and was experiencing delirium while inpatient, we planned to manage her MI medically and withhold invasive procedures. However, patient and family met with palliative care yesterday. Patient's mentation improved and she was able to express her desires for HD and cardiac interventions. Dr. Harl Bowie discussed LHC with the patient and she consented to the procedure. See Dr. Harl Bowie note 1/23 for consent.  - s/p LHC today, CTO of her right with L-R collaterals. Small OM1 80s lesion. Plan is for medical management - Continue DAPT with ASA, plavix  - Continue BB, restart home statin   HTN crisis  - Presented with SBP in the 200s, slowly improving, current SBP in 160s  - On nitro drip, hydralazine 39m TID, metoprolol tartrate 25 mg Q6hrs - Currently holding home amlodipine, clonidine, benicar.  - Will plan to adjust BP meds following LHC. Ideally, patient will be able to tolerate daily/BID medications rather than medications that are given 3-4 times daily as this will help with compliance.    CKD stage V  - IR placed temp perm, patient removed; was replaced - ihd per nephrology  She is stable from a cardiac standpoint. No plans for further recommendations. Cardiology will sign off. If she is not  tolerating IHD, may be a good idea to re-address this with palliative care and her family to consider comfort measures.  For questions or updates, please contact [email or web address] consult [email or web address] for contact info under        Signed, BJanina Mayo MD  01/21/2022, 11:02 AM

## 2022-01-21 NOTE — Progress Notes (Signed)
PT Cancellation Note  Patient Details Name: Monique Cabrera MRN: [id number] DOB: [birth year]   Cancelled Treatment:    Reason Eval/Treat Not Completed: (P) Patient at procedure or test/unavailable HD was unsuccessful yesterday and she was taken to HD to reattempt this morning. PT will follow back for Evaluation this afternoon as able.   Lexine Jaspers B. Migdalia Dk PT, DPT Acute Rehabilitation Services Pager [phone] Office [phone]    [place] 01/21/2022, 9:09 AM

## 2022-01-22 LAB — CBC WITH DIFFERENTIAL/PLATELET
Abs Immature Granulocytes: 0.08 10*3/uL — ABNORMAL HIGH (ref 0.00–0.07)
Basophils Absolute: 0 10*3/uL (ref 0.0–0.1)
Basophils Relative: 0 %
Eosinophils Absolute: 0.1 10*3/uL (ref 0.0–0.5)
Eosinophils Relative: 2 %
HCT: 25 % — ABNORMAL LOW (ref 36.0–46.0)
Hemoglobin: 8.5 g/dL — ABNORMAL LOW (ref 12.0–15.0)
Immature Granulocytes: 1 %
Lymphocytes Relative: 13 %
Lymphs Abs: 1 10*3/uL (ref 0.7–4.0)
MCH: 32.2 pg (ref 26.0–34.0)
MCHC: 34 g/dL (ref 30.0–36.0)
MCV: 94.7 fL (ref 80.0–100.0)
Monocytes Absolute: 0.9 10*3/uL (ref 0.1–1.0)
Monocytes Relative: 12 %
Neutro Abs: 5.6 10*3/uL (ref 1.7–7.7)
Neutrophils Relative %: 72 %
Platelets: 193 10*3/uL (ref 150–400)
RBC: 2.64 MIL/uL — ABNORMAL LOW (ref 3.87–5.11)
RDW: 14.3 % (ref 11.5–15.5)
WBC: 7.8 10*3/uL (ref 4.0–10.5)
nRBC: 0.3 % — ABNORMAL HIGH (ref 0.0–0.2)

## 2022-01-22 LAB — RENAL FUNCTION PANEL
Albumin: 2.8 g/dL — ABNORMAL LOW (ref 3.5–5.0)
Anion gap: 14 (ref 5–15)
BUN: 81 mg/dL — ABNORMAL HIGH (ref 8–23)
CO2: 17 mmol/L — ABNORMAL LOW (ref 22–32)
Calcium: 8.8 mg/dL — ABNORMAL LOW (ref 8.9–10.3)
Chloride: 99 mmol/L (ref 98–111)
Creatinine, Ser: 8.82 mg/dL — ABNORMAL HIGH (ref 0.44–1.00)
GFR, Estimated: 4 mL/min — ABNORMAL LOW (ref >60)
Glucose, Bld: 108 mg/dL — ABNORMAL HIGH (ref 70–99)
Phosphorus: 7.4 mg/dL — ABNORMAL HIGH (ref 2.5–4.6)
Potassium: 4 mmol/L (ref 3.5–5.1)
Sodium: 130 mmol/L — ABNORMAL LOW (ref 135–145)

## 2022-01-22 LAB — GLUCOSE, CAPILLARY
Glucose-Capillary: 104 mg/dL — ABNORMAL HIGH (ref 70–99)
Glucose-Capillary: 104 mg/dL — ABNORMAL HIGH (ref 70–99)
Glucose-Capillary: 105 mg/dL — ABNORMAL HIGH (ref 70–99)
Glucose-Capillary: 109 mg/dL — ABNORMAL HIGH (ref 70–99)
Glucose-Capillary: 110 mg/dL — ABNORMAL HIGH (ref 70–99)
Glucose-Capillary: 114 mg/dL — ABNORMAL HIGH (ref 70–99)
Glucose-Capillary: 117 mg/dL — ABNORMAL HIGH (ref 70–99)

## 2022-01-22 MED ORDER — CHLORHEXIDINE GLUCONATE CLOTH 2 % EX PADS
6.0000 | MEDICATED_PAD | Freq: Every day | CUTANEOUS | Status: DC
  Administered 2022-01-23 – 2022-01-24 (×2): 6 via TOPICAL

## 2022-01-22 NOTE — Procedures (Signed)
Patient was seen on dialysis and the procedure was supervised.  BFR 300  Via vascath  BP is  154/69.   Patient appears to be tolerating treatment well  Louis Meckel 01/22/2022

## 2022-01-22 NOTE — Progress Notes (Signed)
OT Cancellation Note  Patient Details Name: Monique Cabrera MRN: [id number] DOB: 10/23/1935   Cancelled Treatment:    Reason Eval/Treat Not Completed: Patient at procedure or test/ unavailable (HD)  Jeri Modena 01/22/2022, 10:42 AM  Fleeta Emmer, OTR/L  Acute Rehabilitation Services Pager: [phone] Office: [phone] .

## 2022-01-22 NOTE — Progress Notes (Signed)
Subjective:  pt finally is on HD this AM- appreciate IR helping to establish non tunneled HD cath that is functional -  she is maybe clearer mentally this AM   Objective Vital signs in last 24 hours: Vitals:   01/22/22 0848 01/22/22 0900 01/22/22 0930 01/22/22 1000  BP: (!) 164/80 (!) 154/69 (!) 168/83 (!) 167/89  Pulse: 76 76 78 80  Resp:      Temp:      TempSrc:      SpO2:      Weight:      Height:       Weight change:   Intake/Output Summary (Last 24 hours) at 01/22/2022 1105 Last data filed at 01/22/2022 0600 Gross per 24 hour  Intake 170 ml  Output --  Net 170 ml    Assessment/ Plan: Pt is a 87 y.o. yo female with known advanced CKD who was admitted on 01/15/2022 with acute coronary event  Assessment/Plan: 1. Acute coronary event-  cath done-  showed blockage but collaterals-  nothing to intervene upon-  medical management -  seems more stable in that regard 2. Renal-  apparently had been asked several times as OP if she wanted dialysis and she always declined even when faced with decision that she could die without HD -  this is documented in the [facility] record.  Palliative care meeting took place on 1/23 and decision was made to go ahead and pursue HD wanting to do what we could to prolong life.  Now has 3rd temp HD cath in place that appears to be working-  I really do feel like she will be better if we can get her BUN down-  plan for second HD tomorrow and likely third on Monday.  If all this goes well will consult [facility] for tunneled cath and access     3. Anemia-  hgb dropping on heparin -   iron stores OK -  have added on esa   4. HTN/volume-  hypertensive-  volume seems OK-  on nitro/hydralazine/lopressor -  volume does not seem to be a major player at this time but attempting UF as able   5. Bones- started phoslo- PTH 479-  started vitamin D as well      Louis Meckel    Labs: Basic Metabolic Panel: Recent Labs  Lab 01/20/22 0546 01/21/22 0614  01/22/22 0218  NA 129* 132* 130*  K 4.4 4.3 4.0  CL 99 101 99  CO2 15* 16* 17*  GLUCOSE 140* 128* 108*  BUN 78* 80* 81*  CREATININE 9.23* 8.87* 8.82*  CALCIUM 8.9 9.2 8.8*  PHOS 8.3* 7.9* 7.4*   Liver Function Tests: Recent Labs  Lab 01/15/22 1631 01/16/22 0222 01/17/22 0249 01/20/22 0546 01/21/22 0614 01/22/22 0218  AST 23 19  --   --   --   --   ALT 17 11  --   --   --   --   ALKPHOS 53 47  --   --   --   --   BILITOT 1.0 0.7  --   --   --   --   PROT 6.5 5.4*  --   --   --   --   ALBUMIN 3.0* 2.6*   < > 2.7* 2.8* 2.8*   < > = values in this interval not displayed.   Recent Labs  Lab 01/17/22 1105  LIPASE 23   No results for input(s): AMMONIA in the last 168 hours.  CBC: Recent Labs  Lab 01/19/22 0253 01/20/22 0546 01/21/22 0614 01/21/22 1133 01/22/22 0218  WBC 10.3 8.6 6.9 7.0 7.8  NEUTROABS 8.1* 6.4 5.1  --  5.6  HGB 8.4* 8.3* 8.6* 8.6* 8.5*  HCT 25.1* 24.9* 26.3* 25.6* 25.0*  MCV 93.3 94.3 94.3 94.8 94.7  PLT 196 205 207 196 193   Cardiac Enzymes: No results for input(s): CKTOTAL, CKMB, CKMBINDEX, TROPONINI in the last 168 hours. CBG: Recent Labs  Lab 01/21/22 2010 01/22/22 0008 01/22/22 0346 01/22/22 0647 01/22/22 0726  GLUCAP 182* 114* 109* 117* 110*    Iron Studies:  Recent Labs    01/20/22 0546  IRON 73  TIBC 200*  FERRITIN 2,430*   Studies/Results: CARDIAC CATHETERIZATION  Result Date: 01/20/2022   Prox LAD to Mid LAD lesion is 50% stenosed.   1st Mrg lesion is 80% stenosed.   RPDA lesion is 100% stenosed.   Ost RCA lesion is 100% stenosed.   LV end diastolic pressure is moderately elevated. There is evidence for severe multivessel coronary calcification and vessel tortuosity. The LAD has proximal 50% stenosis.  The left circumflex vessel gives rise to a proximal marginal vessel which has focal 80% stenosis on a bend in the vessel; the RCA is totally occluded proximally.  There is extensive left to right collateralization filling the  entire RCA up to the near ostial total occlusion. Elevated LVEDP at 26 mm. RECOMMENDATION: Patient is to initiate dialysis today.  Commend optimal blood pressure control and lipid management.  The patient has been on DAPT with aspirin/Plavix and would continue as long as no significant bleeding.   IR Fluoro Guide CV Line Right  Result Date: 01/21/2022 CLINICAL DATA:  Nonfunctional right jugular temporary non tunneled dialysis catheter placed yesterday. EXAM: EXCHANGE OF NON-TUNNELED CENTRAL VENOUS HEMODIALYSIS CATHETER UNDER FLUOROSCOPIC GUIDANCE ANESTHESIA/SEDATION: None MEDICATIONS: None FLUOROSCOPY TIME:  18 seconds.  7.0 mGy. PROCEDURE: The procedure, risks, benefits, and alternatives were explained to the patient. Questions regarding the procedure were encouraged and answered. The patient understands and consents to the procedure. A time-out was performed prior to initiating the procedure. The preexisting dialysis catheter and surrounding skin were prepped with chlorhexidine in a sterile fashion, and a sterile drape was applied covering the operative field. Maximum barrier sterile technique with sterile gowns and gloves were used for the procedure. Local anesthesia was provided with 1% lidocaine. The pre-existing dialysis catheter was inspected under fluoroscopy. A retention suture at the skin exit site was cut. The preexisting dialysis catheter was removed over a guidewire. A new 8 French, triple lumen Mahurkar non tunneled hemodialysis catheter measuring 16 cm was advanced over the guidewire under fluoroscopy. Final catheter positioning was confirmed and documented with a fluoroscopic spot image. The catheter was aspirated, flushed with saline, and injected with appropriate volume heparin dwells. A Prolene retention suture was applied at the catheter exit site. COMPLICATIONS: None.  No pneumothorax. FINDINGS: Initial fluoroscopy demonstrates a significant kink in the catheter near the entrance site to  the right internal jugular vein. This was related to how the catheter was sutured at the skin at the exit site laterally. After the retention suture was released, the catheter straightened, releasing the kink under fluoroscopy. Given that this catheter was likely occluded today during hemodialysis, a new catheter was exchanged over a guidewire through the pre-existing venous access. All 3 lumens of the new catheter aspirate and flush normally and the catheter is ready for immediate use. IMPRESSION: Exchange of kinked non tunneled hemodialysis catheter  via the right internal jugular vein. A new 16 cm catheter was placed and positioned such that it is not kinked after placement by fluoroscopy. The catheter is ready for immediate use. Electronically Signed   By: Aletta Edouard M.D.   On: 01/21/2022 16:21   Medications: Infusions:  sodium chloride      Scheduled Medications:  aspirin EC  81 mg Oral Daily   atorvastatin  80 mg Oral Daily   calcitRIOL  0.25 mcg Oral Daily   calcium acetate  667 mg Oral TID WC   Chlorhexidine Gluconate Cloth  6 each Topical Daily   Chlorhexidine Gluconate Cloth  6 each Topical Q0600   Chlorhexidine Gluconate Cloth  6 each Topical Q0600   clopidogrel  75 mg Oral Q breakfast   darbepoetin (ARANESP) injection - DIALYSIS  100 mcg Intravenous Q Tue-HD   hydrALAZINE  50 mg Oral Q8H   insulin aspart  0-6 Units Subcutaneous Q4H   metoCLOPramide (REGLAN) injection  5 mg Intravenous TID WC & HS   metoprolol tartrate  25 mg Oral Q6H   pantoprazole  40 mg Oral Daily   sodium chloride flush  3 mL Intravenous Q12H   sodium chloride flush  3 mL Intravenous Q12H    have reviewed scheduled and prn medications.  Physical Exam: General: thin, seen on HD-   seems happy and clear Heart: tachy  Lungs: mostly clear Abdomen: soft, non tender Extremities: no significant peripheral edema  Newer right sided temp HD cath placed 1/26 -  BFR 300     01/22/2022,11:05 AM  LOS: 7 days

## 2022-01-22 NOTE — Progress Notes (Signed)
PROGRESS NOTE    Monique Cabrera  UKG:[id number] DOB: 1935-11-12 DOA: 01/15/2022 PCP: Wardell Honour, MD    Chief Complaint  Patient presents with   Shortness of Breath    Brief Narrative:  87 yo female with hx of diastolic CHF, CKD stage 5(followed by [facility]), type 2 DM, HTN, s/p right BKA, presented to ER with CP and worsening SOB  well over 3 months, which has been progressively worsening. She denies any lower extremity edema. In the ER, pt noted to hypertensive with SBP >200. Pt with difficult IV access. Took several hours to establish PIV. Started on IV ntg gtts. Labs showed troponin 191 --> 244. Cardiology consulted. Scr 7.5 BUN 51. Bicarb 18, BNP >4500. Due to malignant HTN, elevated troponin, CXR consistent with CHF, [facility] consulted for admission.  Assessment & Plan:   Principal Problem:   Malignant hypertension Active Problems:   Type 2 diabetes mellitus with renal complication (HCC)   Status post below-knee amputation of right lower extremity (HCC)   Chronic kidney disease (CKD), stage V (HCC)   Anemia of chronic renal failure   Elevated troponin   Pleural effusion, bilateral   Acute on chronic diastolic CHF (congestive heart failure) (HCC)   DNR (do not resuscitate)/DNI(Do Not Intubate)   Non-ST elevation (NSTEMI) myocardial infarction ([place])   Hypertensive crisis:  BP parameters have improved , better than yesterday.   NSTEMI:  Echo reviewed.   Cardiology consulted, underwent cath, plan for medical management.  Continue with aspirin and plavix, BB and statin.     Stage V CKD with Uremic symptoms -  plan for HD today.  Temp HD cath placed by IR removed by the patient.  Underwent new HD catheter placement, but not functioning. New one exchanged for a new 16 cm. Plan for HD today,.     Anemia of chronic disease:  Transfuse to keep hemoglobin greater than 7.  Hemoglobin stable around 8.5    Hyponatremia probably from CKD.        Type 2 DM:  CBG (last 3)  Recent Labs    01/22/22 0647 01/22/22 0726 01/22/22 1200  GLUCAP 117* 110* 104*    Resume SSI.  Hemoglobin A1c is 5.7%. resume home meds.  No changes in regimen.      DVT prophylaxis: heparin.  Code Status: full code.  Family Communication: none at bedside, updated son.  Disposition:   Status is: Inpatient  Remains inpatient appropriate because: further work up with HD       Consultants:  Cardiology Nephrology.   Procedures: cadiac cath HD.   Antimicrobials: none.    Subjective: No new complaints.   Objective: Vitals:   01/22/22 0930 01/22/22 1000 01/22/22 1138 01/22/22 1153  BP: (!) 168/83 (!) 167/89 (!) (P) 161/84 (!) 157/84  Pulse: 78 80  81  Resp:   (!) (P) 29 20  Temp:   (P) 97.8 F (36.6 C) (!) 97.4 F (36.3 C)  TempSrc:   (P) Oral Oral  SpO2:   (P) 100%   Weight:      Height:        Intake/Output Summary (Last 24 hours) at 01/22/2022 1207 Last data filed at 01/22/2022 1117 Gross per 24 hour  Intake 170 ml  Output 1035 ml  Net -865 ml    Filed Weights   01/21/22 0500 01/21/22 0544 01/22/22 0832  Weight: 60.4 kg 61.4 kg 60.9 kg    Examination:  General exam: Appears calm and comfortable  Respiratory system: Clear to auscultation. Respiratory effort normal. Cardiovascular system: S1 & S2 heard, RRR. No JVD,  Gastrointestinal system: Abdomen is nondistended, soft and nontender. Normal bowel sounds heard. Central nervous system: Alert and oriented. No focal neurological deficits. Extremities: Symmetric 5 x 5 power. Skin: No rashes, lesions or ulcers Psychiatry:Mood & affect appropriate.        Data Reviewed: I have personally reviewed following labs and imaging studies  CBC: Recent Labs  Lab 01/18/22 0344 01/19/22 0253 01/20/22 0546 01/21/22 0614 01/21/22 1133 01/22/22 0218  WBC 11.3* 10.3 8.6 6.9 7.0 7.8  NEUTROABS 9.0* 8.1* 6.4 5.1  --  5.6  HGB 8.7* 8.4* 8.3* 8.6* 8.6* 8.5*   HCT 26.5* 25.1* 24.9* 26.3* 25.6* 25.0*  MCV 95.3 93.3 94.3 94.3 94.8 94.7  PLT 204 196 205 207 196 193     Basic Metabolic Panel: Recent Labs  Lab 01/16/22 0222 01/17/22 0249 01/18/22 0344 01/19/22 0253 01/20/22 0546 01/21/22 0614 01/22/22 0218  NA 137   < > 135 133* 129* 132* 130*  K 4.3   < > 4.7 4.3 4.4 4.3 4.0  CL 106   < > 105 103 99 101 99  CO2 17*   < > 16* 16* 15* 16* 17*  GLUCOSE 103*   < > 164* 137* 140* 128* 108*  BUN 56*   < > 78* 71* 78* 80* 81*  CREATININE 7.59*   < > 8.45* 9.05* 9.23* 8.87* 8.82*  CALCIUM 8.7*   < > 8.9 8.9 8.9 9.2 8.8*  MG 1.9  --   --   --   --   --   --   PHOS  --    < > 9.0* 8.4* 8.3* 7.9* 7.4*   < > = values in this interval not displayed.     GFR: Estimated Creatinine Clearance: 4.3 mL/min (A) (by C-G formula based on SCr of 8.82 mg/dL (H)).  Liver Function Tests: Recent Labs  Lab 01/15/22 1631 01/16/22 0222 01/17/22 0249 01/18/22 0344 01/19/22 0253 01/20/22 0546 01/21/22 0614 01/22/22 0218  AST 23 19  --   --   --   --   --   --   ALT 17 11  --   --   --   --   --   --   ALKPHOS 53 47  --   --   --   --   --   --   BILITOT 1.0 0.7  --   --   --   --   --   --   PROT 6.5 5.4*  --   --   --   --   --   --   ALBUMIN 3.0* 2.6*   < > 2.8* 2.8* 2.7* 2.8* 2.8*   < > = values in this interval not displayed.     CBG: Recent Labs  Lab 01/22/22 0008 01/22/22 0346 01/22/22 0647 01/22/22 0726 01/22/22 1200  GLUCAP 114* 109* 117* 110* 104*      Recent Results (from the past 240 hour(s))  Resp Panel by RT-PCR (Flu A&B, Covid) Nasopharyngeal Swab     Status: None   Collection Time: 01/15/22  1:36 PM   Specimen: Nasopharyngeal Swab; Nasopharyngeal(NP) swabs in vial transport medium  Result Value Ref Range Status   SARS Coronavirus 2 by RT PCR NEGATIVE NEGATIVE Final    Comment: (NOTE) SARS-CoV-2 target nucleic acids are NOT DETECTED.  The SARS-CoV-2 RNA is generally detectable in upper  respiratory specimens during  the acute phase of infection. The lowest concentration of SARS-CoV-2 viral copies this assay can detect is 138 copies/mL. A negative result does not preclude SARS-Cov-2 infection and should not be used as the sole basis for treatment or other patient management decisions. A negative result may occur with  improper specimen collection/handling, submission of specimen other than nasopharyngeal swab, presence of viral mutation(s) within the areas targeted by this assay, and inadequate number of viral copies(<138 copies/mL). A negative result must be combined with clinical observations, patient history, and epidemiological information. The expected result is Negative.  Fact Sheet for Patients:  EntrepreneurPulse.com.au  Fact Sheet for Healthcare Providers:  IncredibleEmployment.be  This test is no t yet approved or cleared by the [place] FDA and  has been authorized for detection and/or diagnosis of SARS-CoV-2 by FDA under an Emergency Use Authorization (EUA). This EUA will remain  in effect (meaning this test can be used) for the duration of the COVID-19 declaration under Section 564(b)(1) of the Act, 21 U.S.C.section 360bbb-3(b)(1), unless the authorization is terminated  or revoked sooner.       Influenza A by PCR NEGATIVE NEGATIVE Final   Influenza B by PCR NEGATIVE NEGATIVE Final    Comment: (NOTE) The Xpert Xpress SARS-CoV-2/FLU/RSV plus assay is intended as an aid in the diagnosis of influenza from Nasopharyngeal swab specimens and should not be used as a sole basis for treatment. Nasal washings and aspirates are unacceptable for Xpert Xpress SARS-CoV-2/FLU/RSV testing.  Fact Sheet for Patients: EntrepreneurPulse.com.au  Fact Sheet for Healthcare Providers: IncredibleEmployment.be  This test is not yet approved or cleared by the [place] FDA and has been authorized for detection and/or  diagnosis of SARS-CoV-2 by FDA under an Emergency Use Authorization (EUA). This EUA will remain in effect (meaning this test can be used) for the duration of the COVID-19 declaration under Section 564(b)(1) of the Act, 21 U.S.C. section 360bbb-3(b)(1), unless the authorization is terminated or revoked.  Performed at [facility], [place] [address] [place], [place] [address]           Radiology Studies: IR Fluoro Guide CV Line Right  Result Date: 01/21/2022 CLINICAL DATA:  Nonfunctional right jugular temporary non tunneled dialysis catheter placed yesterday. EXAM: EXCHANGE OF NON-TUNNELED CENTRAL VENOUS HEMODIALYSIS CATHETER UNDER FLUOROSCOPIC GUIDANCE ANESTHESIA/SEDATION: None MEDICATIONS: None FLUOROSCOPY TIME:  18 seconds.  7.0 mGy. PROCEDURE: The procedure, risks, benefits, and alternatives were explained to the patient. Questions regarding the procedure were encouraged and answered. The patient understands and consents to the procedure. A time-out was performed prior to initiating the procedure. The preexisting dialysis catheter and surrounding skin were prepped with chlorhexidine in a sterile fashion, and a sterile drape was applied covering the operative field. Maximum barrier sterile technique with sterile gowns and gloves were used for the procedure. Local anesthesia was provided with 1% lidocaine. The pre-existing dialysis catheter was inspected under fluoroscopy. A retention suture at the skin exit site was cut. The preexisting dialysis catheter was removed over a guidewire. A new 49 French, triple lumen Mahurkar non tunneled hemodialysis catheter measuring 16 cm was advanced over the guidewire under fluoroscopy. Final catheter positioning was confirmed and documented with a fluoroscopic spot image. The catheter was aspirated, flushed with saline, and injected with appropriate volume heparin dwells. A Prolene retention suture was applied at the catheter exit site. COMPLICATIONS:  None.  No pneumothorax. FINDINGS: Initial fluoroscopy demonstrates a significant kink in the catheter near the entrance site to  the right internal jugular vein. This was related to how the catheter was sutured at the skin at the exit site laterally. After the retention suture was released, the catheter straightened, releasing the kink under fluoroscopy. Given that this catheter was likely occluded today during hemodialysis, a new catheter was exchanged over a guidewire through the pre-existing venous access. All 3 lumens of the new catheter aspirate and flush normally and the catheter is ready for immediate use. IMPRESSION: Exchange of kinked non tunneled hemodialysis catheter via the right internal jugular vein. A new 16 cm catheter was placed and positioned such that it is not kinked after placement by fluoroscopy. The catheter is ready for immediate use. Electronically Signed   By: Aletta Edouard M.D.   On: 01/21/2022 16:21        Scheduled Meds:  aspirin EC  81 mg Oral Daily   atorvastatin  80 mg Oral Daily   calcitRIOL  0.25 mcg Oral Daily   calcium acetate  667 mg Oral TID WC   Chlorhexidine Gluconate Cloth  6 each Topical Daily   Chlorhexidine Gluconate Cloth  6 each Topical Q0600   Chlorhexidine Gluconate Cloth  6 each Topical Q0600   clopidogrel  75 mg Oral Q breakfast   darbepoetin (ARANESP) injection - DIALYSIS  100 mcg Intravenous Q Tue-HD   hydrALAZINE  50 mg Oral Q8H   insulin aspart  0-6 Units Subcutaneous Q4H   metoCLOPramide (REGLAN) injection  5 mg Intravenous TID WC & HS   metoprolol tartrate  25 mg Oral Q6H   pantoprazole  40 mg Oral Daily   sodium chloride flush  3 mL Intravenous Q12H   sodium chloride flush  3 mL Intravenous Q12H   Continuous Infusions:  sodium chloride       LOS: 7 days        Hosie Poisson, MD [facility]   To contact the attending provider between 7A-7P or the covering provider during after hours 7P-7A, please log into the web  site [email or web address] and access using universal [place] password for that web site. If you do not have the password, please call the hospital operator.  01/22/2022, 12:07 PM

## 2022-01-22 NOTE — Progress Notes (Signed)
PT Cancellation Note  Patient Details Name: Monique Cabrera MRN: [id number] DOB: 03/27/1935   Cancelled Treatment:    Reason Eval/Treat Not Completed: Fatigue/lethargy limiting ability to participate this afternoon, reports she has been busy all day long and requests PT return at a later date. Will attempt to follow up over the weekend as time/schedule allows.   West Carbo, PT, DPT   Acute Rehabilitation Department Pager #: [phone]  Sandra Cockayne 01/22/2022, 3:42 PM

## 2022-01-23 LAB — GLUCOSE, CAPILLARY
Glucose-Capillary: 106 mg/dL — ABNORMAL HIGH (ref 70–99)
Glucose-Capillary: 122 mg/dL — ABNORMAL HIGH (ref 70–99)
Glucose-Capillary: 146 mg/dL — ABNORMAL HIGH (ref 70–99)
Glucose-Capillary: 77 mg/dL (ref 70–99)
Glucose-Capillary: 98 mg/dL (ref 70–99)

## 2022-01-23 LAB — CBC WITH DIFFERENTIAL/PLATELET
Abs Immature Granulocytes: 0.12 10*3/uL — ABNORMAL HIGH (ref 0.00–0.07)
Basophils Absolute: 0 10*3/uL (ref 0.0–0.1)
Basophils Relative: 0 %
Eosinophils Absolute: 0.1 10*3/uL (ref 0.0–0.5)
Eosinophils Relative: 1 %
HCT: 25.8 % — ABNORMAL LOW (ref 36.0–46.0)
Hemoglobin: 8.3 g/dL — ABNORMAL LOW (ref 12.0–15.0)
Immature Granulocytes: 1 %
Lymphocytes Relative: 12 %
Lymphs Abs: 1.1 10*3/uL (ref 0.7–4.0)
MCH: 30.5 pg (ref 26.0–34.0)
MCHC: 32.2 g/dL (ref 30.0–36.0)
MCV: 94.9 fL (ref 80.0–100.0)
Monocytes Absolute: 1 10*3/uL (ref 0.1–1.0)
Monocytes Relative: 11 %
Neutro Abs: 6.9 10*3/uL (ref 1.7–7.7)
Neutrophils Relative %: 75 %
Platelets: 187 10*3/uL (ref 150–400)
RBC: 2.72 MIL/uL — ABNORMAL LOW (ref 3.87–5.11)
RDW: 14.7 % (ref 11.5–15.5)
WBC: 9.2 10*3/uL (ref 4.0–10.5)
nRBC: 0 % (ref 0.0–0.2)

## 2022-01-23 LAB — RENAL FUNCTION PANEL
Albumin: 2.7 g/dL — ABNORMAL LOW (ref 3.5–5.0)
Anion gap: 15 (ref 5–15)
BUN: 45 mg/dL — ABNORMAL HIGH (ref 8–23)
CO2: 16 mmol/L — ABNORMAL LOW (ref 22–32)
Calcium: 8.7 mg/dL — ABNORMAL LOW (ref 8.9–10.3)
Chloride: 101 mmol/L (ref 98–111)
Creatinine, Ser: 6.1 mg/dL — ABNORMAL HIGH (ref 0.44–1.00)
GFR, Estimated: 6 mL/min — ABNORMAL LOW (ref >60)
Glucose, Bld: 100 mg/dL — ABNORMAL HIGH (ref 70–99)
Phosphorus: 5.3 mg/dL — ABNORMAL HIGH (ref 2.5–4.6)
Potassium: 4.3 mmol/L (ref 3.5–5.1)
Sodium: 132 mmol/L — ABNORMAL LOW (ref 135–145)

## 2022-01-23 MED ORDER — HEPARIN SODIUM (PORCINE) 1000 UNIT/ML IJ SOLN
INTRAMUSCULAR | Status: AC
  Filled 2022-01-23: qty 1

## 2022-01-23 MED ORDER — HYDRALAZINE HCL 50 MG PO TABS
75.0000 mg | ORAL_TABLET | Freq: Three times a day (TID) | ORAL | Status: DC
  Administered 2022-01-23 – 2022-01-28 (×13): 75 mg via ORAL
  Filled 2022-01-23 (×14): qty 1

## 2022-01-23 NOTE — Plan of Care (Signed)
  Problem: Safety: Goal: Ability to remain free from injury will improve Outcome: Progressing   

## 2022-01-23 NOTE — Progress Notes (Signed)
Physical Therapy Treatment Patient Details Name: Monique Cabrera MRN: [id number] DOB: 1935-02-25 Today's Date: 01/23/2022   History of Present Illness Pt is an 87 y/o female admitted secondary to HTN urgency and NSTEMI. Pt is s/p heart cath on 1/25. Pt also with problems with HD cath and to IR on 1/26. PMH includes ESRD on HD, R BKA, dCHF, HTN, and DM.    PT Comments    The pt was eager to mobilize this afternoon, and was able to demo good progression of ambulation distance compared to evaluation, but demos significant limitation in stability and endurance compared to baseline mobility. The pt was initially able to complete sit-stand with minG and use of RW and ambulated with minG, but progressed to need for modA with ambulation with exertion after ~40 ft. The pt is highly motivated to improve endurance to pre-hospital baseline, and will benefit from skilled PT acutely and following d/c to achieve this goal.     Recommendations for follow up therapy are one component of a multi-disciplinary discharge planning process, led by the attending physician.  Recommendations may be updated based on patient status, additional functional criteria and insurance authorization.  Follow Up Recommendations  Home health PT     Assistance Recommended at Discharge Frequent or constant Supervision/Assistance  Patient can return home with the following A little help with walking and/or transfers;A little help with bathing/dressing/bathroom;Help with stairs or ramp for entrance;Assist for transportation;Assistance with cooking/housework   Equipment Recommendations  None recommended by PT    Recommendations for Other Services       Precautions / Restrictions Precautions Precautions: Fall Precaution Comments: R BKA at baseline, has prosthetic Required Braces or Orthoses: Other Brace Other Brace: RLE prosthetic Restrictions Weight Bearing Restrictions: No     Mobility  Bed Mobility Overal bed  mobility: Modified Independent                  Transfers Overall transfer level: Needs assistance Equipment used: Rolling walker (2 wheels) Transfers: Sit to/from Stand Sit to Stand: Min guard, Min assist           General transfer comment: minG to power up, minA from low surface without armrests. poor eccentric control    Ambulation/Gait Ambulation/Gait assistance: Min guard, Mod assist Gait Distance (Feet): 35 Feet (+ 15 ft + 10 ft after seated rest) Assistive device: Rolling walker (2 wheels) Gait Pattern/deviations: Step-to pattern, Decreased stride length, Trunk flexed Gait velocity: decreased Gait velocity interpretation: <1.31 ft/sec, indicative of household ambulator   General Gait Details: pt with small steps and  heavy reliance on BUE support. progressed from minG to [place] with fatigue      Balance Overall balance assessment: Needs assistance Sitting-balance support: No upper extremity supported Sitting balance-Leahy Scale: Good     Standing balance support: Single extremity supported Standing balance-Leahy Scale: Poor Standing balance comment: Reliant on at least 1 UE support                            Cognition Arousal/Alertness: Awake/alert Behavior During Therapy: WFL for tasks assessed/performed Overall Cognitive Status: No family/caregiver present to determine baseline cognitive functioning                                 General Comments: functional for tasks during session, poor insight to endurance        Exercises  General Comments General comments (skin integrity, edema, etc.): VSS on RA      Pertinent Vitals/Pain Pain Assessment Pain Assessment: No/denies pain     PT Goals (current goals can now be found in the care plan section) Acute Rehab PT Goals Patient Stated Goal: to go home PT Goal Formulation: With patient Time For Goal Achievement: 02/04/22 Potential to Achieve Goals: Good Progress  towards PT goals: Progressing toward goals    Frequency    Min 3X/week      PT Plan Current plan remains appropriate       AM-PAC PT "6 Clicks" Mobility   Outcome Measure  Help needed turning from your back to your side while in a flat bed without using bedrails?: None Help needed moving from lying on your back to sitting on the side of a flat bed without using bedrails?: None Help needed moving to and from a bed to a chair (including a wheelchair)?: A Little Help needed standing up from a chair using your arms (e.g., wheelchair or bedside chair)?: A Little Help needed to walk in hospital room?: A Little Help needed climbing 3-5 steps with a railing? : A Lot 6 Click Score: 19    End of Session Equipment Utilized During Treatment: Gait belt Activity Tolerance: Patient tolerated treatment well Patient left: in chair;with call bell/phone within reach;with chair alarm set;with nursing/sitter in room Nurse Communication: Mobility status PT Visit Diagnosis: Unsteadiness on feet (R26.81);Muscle weakness (generalized) (M62.81)     Time: 4268-3419 PT Time Calculation (min) (ACUTE ONLY): 25 min  Charges:  $Therapeutic Exercise: 23-37 mins                     [place], PT, DPT   Acute Rehabilitation Department Pager #: [phone]   Sandra Cockayne 01/23/2022, 5:58 PM

## 2022-01-23 NOTE — Progress Notes (Signed)
PROGRESS NOTE    Monique Cabrera  ZOX:[id number] DOB: 1935/04/06 DOA: 01/15/2022 PCP: Wardell Honour, MD    Chief Complaint  Patient presents with   Shortness of Breath    Brief Narrative:  87 yo female with hx of diastolic CHF, CKD stage 5(followed by [facility]), type 2 DM, HTN, s/p right BKA, presented to ER with CP and worsening SOB  well over 3 months, which has been progressively worsening. She denies any lower extremity edema. In the ER, pt noted to hypertensive with SBP >200. Pt with difficult IV access. Took several hours to establish PIV. Started on IV ntg gtts. Labs showed troponin 191 --> 244. Cardiology consulted. Scr 7.5 BUN 51. Bicarb 18, BNP >4500. Due to malignant HTN, elevated troponin, CXR consistent with CHF, [facility] consulted for admission.  Assessment & Plan:   Principal Problem:   Malignant hypertension Active Problems:   Type 2 diabetes mellitus with renal complication (HCC)   Status post below-knee amputation of right lower extremity (HCC)   Chronic kidney disease (CKD), stage V (HCC)   Anemia of chronic renal failure   Elevated troponin   Pleural effusion, bilateral   Acute on chronic diastolic CHF (congestive heart failure) (HCC)   DNR (do not resuscitate)/DNI(Do Not Intubate)   Non-ST elevation (NSTEMI) myocardial infarction [facility])   Hypertensive crisis:  Much improved. Increase hydralazine 75 mg TID.   NSTEMI:  Echo reviewed.   Cardiology consulted, underwent cath, plan for medical management.  Continue with aspirin and plavix, BB and statin.     Stage V CKD with Uremic symptoms Temp HD cath placed by IR removed by the patient.  Underwent new HD catheter placement, but not functioning. New one exchanged for a new 16 cm. Nephrology on board and following.  Pt tolerating HD. Will need CLIP.     Anemia of chronic disease:  Transfuse to keep hemoglobin greater than 7.  Hemoglobin stable around 8   Hyponatremia probably  from CKD.  Improving.    Type 2 DM:  CBG (last 3)  Recent Labs    01/23/22 0442 01/23/22 1304 01/23/22 1602  GLUCAP 106* 77 122*    Resume SSI.  Hemoglobin A1c is 5.7%. resume home meds.  No changes in regimen.      DVT prophylaxis: heparin.  Code Status: full code.  Family Communication: none at bedside, updated son.  Disposition:   Status is: Inpatient  Remains inpatient appropriate because: further work up with HD       Consultants:  Cardiology Nephrology.   Procedures: cadiac cath HD.   Antimicrobials: none.    Subjective: No new complaints.   Objective: Vitals:   01/23/22 1146 01/23/22 1246 01/23/22 1401 01/23/22 1716  BP: (!) 162/88 (!) 168/84 (!) 159/84 (!) 163/79  Pulse: 80  81 93  Resp: 18 18 16 16   Temp: (!) 97 F (36.1 C) (!) 97.4 F (36.3 C) (!) 97.5 F (36.4 C) 97.8 F (36.6 C)  TempSrc: Oral  Oral Oral  SpO2: 98% 98%  98%  Weight: 60.1 kg     Height:        Intake/Output Summary (Last 24 hours) at 01/23/2022 1810 Last data filed at 01/23/2022 1300 Gross per 24 hour  Intake 120 ml  Output 1000 ml  Net -880 ml    Filed Weights   01/23/22 0528 01/23/22 0810 01/23/22 1146  Weight: 61.6 kg 61.1 kg 60.1 kg    Examination:  General exam: Appears calm and  comfortable  Respiratory system: Clear to auscultation. Respiratory effort normal. Cardiovascular system: S1 & S2 heard, RRR. No JVD,. No pedal edema. Gastrointestinal system: Abdomen is nondistended, soft and nontender. Normal bowel sounds heard. Central nervous system: Alert and oriented. No focal neurological deficits. Extremities: Symmetric 5 x 5 power. Skin: No rashes,  Psychiatry:  Mood & affect appropriate.          Data Reviewed: I have personally reviewed following labs and imaging studies  CBC: Recent Labs  Lab 01/19/22 0253 01/20/22 0546 01/21/22 0614 01/21/22 1133 01/22/22 0218 01/23/22 0236  WBC 10.3 8.6 6.9 7.0 7.8 9.2  NEUTROABS 8.1* 6.4 5.1   --  5.6 6.9  HGB 8.4* 8.3* 8.6* 8.6* 8.5* 8.3*  HCT 25.1* 24.9* 26.3* 25.6* 25.0* 25.8*  MCV 93.3 94.3 94.3 94.8 94.7 94.9  PLT 196 205 207 196 193 187     Basic Metabolic Panel: Recent Labs  Lab 01/19/22 0253 01/20/22 0546 01/21/22 0614 01/22/22 0218 01/23/22 0236  NA 133* 129* 132* 130* 132*  K 4.3 4.4 4.3 4.0 4.3  CL 103 99 101 99 101  CO2 16* 15* 16* 17* 16*  GLUCOSE 137* 140* 128* 108* 100*  BUN 71* 78* 80* 81* 45*  CREATININE 9.05* 9.23* 8.87* 8.82* 6.10*  CALCIUM 8.9 8.9 9.2 8.8* 8.7*  PHOS 8.4* 8.3* 7.9* 7.4* 5.3*     GFR: Estimated Creatinine Clearance: 6.2 mL/min (A) (by C-G formula based on SCr of 6.1 mg/dL (H)).  Liver Function Tests: Recent Labs  Lab 01/19/22 0253 01/20/22 0546 01/21/22 0614 01/22/22 0218 01/23/22 0236  ALBUMIN 2.8* 2.7* 2.8* 2.8* 2.7*     CBG: Recent Labs  Lab 01/22/22 2021 01/23/22 0011 01/23/22 0442 01/23/22 1304 01/23/22 1602  GLUCAP 104* 98 106* 77 122*      Recent Results (from the past 240 hour(s))  Resp Panel by RT-PCR (Flu A&B, Covid) Nasopharyngeal Swab     Status: None   Collection Time: 01/15/22  1:36 PM   Specimen: Nasopharyngeal Swab; Nasopharyngeal(NP) swabs in vial transport medium  Result Value Ref Range Status   SARS Coronavirus 2 by RT PCR NEGATIVE NEGATIVE Final    Comment: (NOTE) SARS-CoV-2 target nucleic acids are NOT DETECTED.  The SARS-CoV-2 RNA is generally detectable in upper respiratory specimens during the acute phase of infection. The lowest concentration of SARS-CoV-2 viral copies this assay can detect is 138 copies/mL. A negative result does not preclude SARS-Cov-2 infection and should not be used as the sole basis for treatment or other patient management decisions. A negative result may occur with  improper specimen collection/handling, submission of specimen other than nasopharyngeal swab, presence of viral mutation(s) within the areas targeted by this assay, and inadequate number  of viral copies(<138 copies/mL). A negative result must be combined with clinical observations, patient history, and epidemiological information. The expected result is Negative.  Fact Sheet for Patients:  EntrepreneurPulse.com.au  Fact Sheet for Healthcare Providers:  IncredibleEmployment.be  This test is no t yet approved or cleared by the [place] FDA and  has been authorized for detection and/or diagnosis of SARS-CoV-2 by FDA under an Emergency Use Authorization (EUA). This EUA will remain  in effect (meaning this test can be used) for the duration of the COVID-19 declaration under Section 564(b)(1) of the Act, 21 U.S.C.section 360bbb-3(b)(1), unless the authorization is terminated  or revoked sooner.       Influenza A by PCR NEGATIVE NEGATIVE Final   Influenza B by PCR NEGATIVE NEGATIVE Final  Comment: (NOTE) The Xpert Xpress SARS-CoV-2/FLU/RSV plus assay is intended as an aid in the diagnosis of influenza from Nasopharyngeal swab specimens and should not be used as a sole basis for treatment. Nasal washings and aspirates are unacceptable for Xpert Xpress SARS-CoV-2/FLU/RSV testing.  Fact Sheet for Patients: EntrepreneurPulse.com.au  Fact Sheet for Healthcare Providers: IncredibleEmployment.be  This test is not yet approved or cleared by the [place] FDA and has been authorized for detection and/or diagnosis of SARS-CoV-2 by FDA under an Emergency Use Authorization (EUA). This EUA will remain in effect (meaning this test can be used) for the duration of the COVID-19 declaration under Section 564(b)(1) of the Act, 21 U.S.C. section 360bbb-3(b)(1), unless the authorization is terminated or revoked.  Performed at [facility], [place] [address] [place], [place] [address]           Radiology Studies: No results found.      Scheduled Meds:  aspirin EC  81 mg Oral Daily    atorvastatin  80 mg Oral Daily   calcitRIOL  0.25 mcg Oral Daily   calcium acetate  667 mg Oral TID WC   Chlorhexidine Gluconate Cloth  6 each Topical Daily   Chlorhexidine Gluconate Cloth  6 each Topical Q0600   clopidogrel  75 mg Oral Q breakfast   darbepoetin (ARANESP) injection - DIALYSIS  100 mcg Intravenous Q Tue-HD   hydrALAZINE  50 mg Oral Q8H   insulin aspart  0-6 Units Subcutaneous Q4H   metoCLOPramide (REGLAN) injection  5 mg Intravenous TID WC & HS   metoprolol tartrate  25 mg Oral Q6H   pantoprazole  40 mg Oral Daily   sodium chloride flush  3 mL Intravenous Q12H   sodium chloride flush  3 mL Intravenous Q12H   Continuous Infusions:  sodium chloride       LOS: 8 days        Hosie Poisson, MD [facility]   To contact the attending provider between 7A-7P or the covering provider during after hours 7P-7A, please log into the web site [email or web address] and access using universal [place] password for that web site. If you do not have the password, please call the hospital operator.  01/23/2022, 6:10 PM

## 2022-01-23 NOTE — Procedures (Signed)
Patient was seen on dialysis and the procedure was supervised.  BFR 300  Via vascath BP is  high-  push UF a little.   Patient appears to be tolerating treatment well  Louis Meckel 01/23/2022

## 2022-01-23 NOTE — Progress Notes (Signed)
Subjective:  seen on HD for second treatment -   pre BUN 45 -  looks clearer    Objective Vital signs in last 24 hours: Vitals:   01/23/22 0810 01/23/22 0822 01/23/22 0830 01/23/22 0900  BP: (!) 184/95 (!) 177/104 (!) 173/100 (!) 176/93  Pulse: 83 77 82 79  Resp: (!) 22 (!) 22 16 16   Temp: 97.9 F (36.6 C)     TempSrc: Oral     SpO2: 100% 99% 100% 99%  Weight: 61.1 kg     Height:       Weight change:   Intake/Output Summary (Last 24 hours) at 01/23/2022 0908 Last data filed at 01/22/2022 1800 Gross per 24 hour  Intake 340 ml  Output 1035 ml  Net -695 ml    Assessment/ Plan: Pt is a 87 y.o. yo female with known advanced CKD who was admitted on 01/15/2022 with acute coronary event  Assessment/Plan: 1. Acute coronary event-  cath done-  showed blockage but collaterals-  nothing to intervene upon-  medical management -  seems more stable in that regard 2. Renal-  apparently had been asked several times as OP if she wanted dialysis and she always declined even when faced with decision that she could die without HD -  documented in the [place] record.  Palliative care meeting took place on 1/23 and decision was made to go ahead and pursue HD wanting to do what we could to prolong life.  Now has 3rd temp HD cath in place that appears to be working-  first HD 1/27, second today 1/28 and 3rd for Monday 1/30- will need to work on perm cath and av access per VVS early next week-  I  have talked with renal navigator regarding this patient regarding CLIP   3. Anemia-  hgb dropping on heparin -   iron stores OK -  have added on esa   4. HTN/volume-  hypertensive-  volume seems heavy-  on nitro/hydralazine/lopressor -   UF as able with HD  5. Bones- started phoslo- PTH 479-  started vitamin D as well      Louis Meckel    Labs: Basic Metabolic Panel: Recent Labs  Lab 01/21/22 0614 01/22/22 0218 01/23/22 0236  NA 132* 130* 132*  K 4.3 4.0 4.3  CL 101 99 101  CO2 16* 17* 16*   GLUCOSE 128* 108* 100*  BUN 80* 81* 45*  CREATININE 8.87* 8.82* 6.10*  CALCIUM 9.2 8.8* 8.7*  PHOS 7.9* 7.4* 5.3*   Liver Function Tests: Recent Labs  Lab 01/21/22 0614 01/22/22 0218 01/23/22 0236  ALBUMIN 2.8* 2.8* 2.7*   Recent Labs  Lab 01/17/22 1105  LIPASE 23   No results for input(s): AMMONIA in the last 168 hours. CBC: Recent Labs  Lab 01/20/22 0546 01/21/22 0614 01/21/22 1133 01/22/22 0218 01/23/22 0236  WBC 8.6 6.9 7.0 7.8 9.2  NEUTROABS 6.4 5.1  --  5.6 6.9  HGB 8.3* 8.6* 8.6* 8.5* 8.3*  HCT 24.9* 26.3* 25.6* 25.0* 25.8*  MCV 94.3 94.3 94.8 94.7 94.9  PLT 205 207 196 193 187   Cardiac Enzymes: No results for input(s): CKTOTAL, CKMB, CKMBINDEX, TROPONINI in the last 168 hours. CBG: Recent Labs  Lab 01/22/22 1200 01/22/22 1609 01/22/22 2021 01/23/22 0011 01/23/22 0442  GLUCAP 104* 105* 104* 98 106*    Iron Studies:  No results for input(s): IRON, TIBC, TRANSFERRIN, FERRITIN in the last 72 hours.  Studies/Results: IR Fluoro Guide CV Line Right  Result Date: 01/21/2022 CLINICAL DATA:  Nonfunctional right jugular temporary non tunneled dialysis catheter placed yesterday. EXAM: EXCHANGE OF NON-TUNNELED CENTRAL VENOUS HEMODIALYSIS CATHETER UNDER FLUOROSCOPIC GUIDANCE ANESTHESIA/SEDATION: None MEDICATIONS: None FLUOROSCOPY TIME:  18 seconds.  7.0 mGy. PROCEDURE: The procedure, risks, benefits, and alternatives were explained to the patient. Questions regarding the procedure were encouraged and answered. The patient understands and consents to the procedure. A time-out was performed prior to initiating the procedure. The preexisting dialysis catheter and surrounding skin were prepped with chlorhexidine in a sterile fashion, and a sterile drape was applied covering the operative field. Maximum barrier sterile technique with sterile gowns and gloves were used for the procedure. Local anesthesia was provided with 1% lidocaine. The pre-existing dialysis catheter  was inspected under fluoroscopy. A retention suture at the skin exit site was cut. The preexisting dialysis catheter was removed over a guidewire. A new 89 French, triple lumen Mahurkar non tunneled hemodialysis catheter measuring 16 cm was advanced over the guidewire under fluoroscopy. Final catheter positioning was confirmed and documented with a fluoroscopic spot image. The catheter was aspirated, flushed with saline, and injected with appropriate volume heparin dwells. A Prolene retention suture was applied at the catheter exit site. COMPLICATIONS: None.  No pneumothorax. FINDINGS: Initial fluoroscopy demonstrates a significant kink in the catheter near the entrance site to the right internal jugular vein. This was related to how the catheter was sutured at the skin at the exit site laterally. After the retention suture was released, the catheter straightened, releasing the kink under fluoroscopy. Given that this catheter was likely occluded today during hemodialysis, a new catheter was exchanged over a guidewire through the pre-existing venous access. All 3 lumens of the new catheter aspirate and flush normally and the catheter is ready for immediate use. IMPRESSION: Exchange of kinked non tunneled hemodialysis catheter via the right internal jugular vein. A new 16 cm catheter was placed and positioned such that it is not kinked after placement by fluoroscopy. The catheter is ready for immediate use. Electronically Signed   By: Aletta Edouard M.D.   On: 01/21/2022 16:21   Medications: Infusions:  sodium chloride      Scheduled Medications:  aspirin EC  81 mg Oral Daily   atorvastatin  80 mg Oral Daily   calcitRIOL  0.25 mcg Oral Daily   calcium acetate  667 mg Oral TID WC   Chlorhexidine Gluconate Cloth  6 each Topical Daily   Chlorhexidine Gluconate Cloth  6 each Topical Q0600   clopidogrel  75 mg Oral Q breakfast   darbepoetin (ARANESP) injection - DIALYSIS  100 mcg Intravenous Q Tue-HD    hydrALAZINE  50 mg Oral Q8H   insulin aspart  0-6 Units Subcutaneous Q4H   metoCLOPramide (REGLAN) injection  5 mg Intravenous TID WC & HS   metoprolol tartrate  25 mg Oral Q6H   pantoprazole  40 mg Oral Daily   sodium chloride flush  3 mL Intravenous Q12H   sodium chloride flush  3 mL Intravenous Q12H    have reviewed scheduled and prn medications.  Physical Exam: General: thin, seen on HD-   seems happy and clear Heart: tachy  Lungs: mostly clear Abdomen: soft, non tender Extremities: actually does have some pitting edema  Newer right sided temp HD cath placed 1/26 -  BFR 300     01/23/2022,9:08 AM  LOS: 8 days

## 2022-01-24 LAB — GLUCOSE, CAPILLARY
Glucose-Capillary: 130 mg/dL — ABNORMAL HIGH (ref 70–99)
Glucose-Capillary: 134 mg/dL — ABNORMAL HIGH (ref 70–99)
Glucose-Capillary: 137 mg/dL — ABNORMAL HIGH (ref 70–99)
Glucose-Capillary: 139 mg/dL — ABNORMAL HIGH (ref 70–99)
Glucose-Capillary: 156 mg/dL — ABNORMAL HIGH (ref 70–99)
Glucose-Capillary: 173 mg/dL — ABNORMAL HIGH (ref 70–99)

## 2022-01-24 LAB — RENAL FUNCTION PANEL
Albumin: 2.6 g/dL — ABNORMAL LOW (ref 3.5–5.0)
Anion gap: 12 (ref 5–15)
BUN: 22 mg/dL (ref 8–23)
CO2: 23 mmol/L (ref 22–32)
Calcium: 8.5 mg/dL — ABNORMAL LOW (ref 8.9–10.3)
Chloride: 98 mmol/L (ref 98–111)
Creatinine, Ser: 4.08 mg/dL — ABNORMAL HIGH (ref 0.44–1.00)
GFR, Estimated: 10 mL/min — ABNORMAL LOW (ref >60)
Glucose, Bld: 129 mg/dL — ABNORMAL HIGH (ref 70–99)
Phosphorus: 3.6 mg/dL (ref 2.5–4.6)
Potassium: 3.5 mmol/L (ref 3.5–5.1)
Sodium: 133 mmol/L — ABNORMAL LOW (ref 135–145)

## 2022-01-24 LAB — CBC WITH DIFFERENTIAL/PLATELET
Abs Immature Granulocytes: 0.12 10*3/uL — ABNORMAL HIGH (ref 0.00–0.07)
Basophils Absolute: 0 10*3/uL (ref 0.0–0.1)
Basophils Relative: 0 %
Eosinophils Absolute: 0.1 10*3/uL (ref 0.0–0.5)
Eosinophils Relative: 1 %
HCT: 26.5 % — ABNORMAL LOW (ref 36.0–46.0)
Hemoglobin: 8.3 g/dL — ABNORMAL LOW (ref 12.0–15.0)
Immature Granulocytes: 1 %
Lymphocytes Relative: 9 %
Lymphs Abs: 0.9 10*3/uL (ref 0.7–4.0)
MCH: 30.7 pg (ref 26.0–34.0)
MCHC: 31.3 g/dL (ref 30.0–36.0)
MCV: 98.1 fL (ref 80.0–100.0)
Monocytes Absolute: 1.5 10*3/uL — ABNORMAL HIGH (ref 0.1–1.0)
Monocytes Relative: 15 %
Neutro Abs: 7.6 10*3/uL (ref 1.7–7.7)
Neutrophils Relative %: 74 %
Platelets: 191 10*3/uL (ref 150–400)
RBC: 2.7 MIL/uL — ABNORMAL LOW (ref 3.87–5.11)
RDW: 14.9 % (ref 11.5–15.5)
WBC: 10.2 10*3/uL (ref 4.0–10.5)
nRBC: 0 % (ref 0.0–0.2)

## 2022-01-24 MED ORDER — TRAZODONE HCL 50 MG PO TABS
50.0000 mg | ORAL_TABLET | Freq: Every evening | ORAL | Status: DC | PRN
  Administered 2022-01-24 – 2022-01-28 (×4): 50 mg via ORAL
  Filled 2022-01-24 (×5): qty 1

## 2022-01-24 MED ORDER — CHLORHEXIDINE GLUCONATE CLOTH 2 % EX PADS
6.0000 | MEDICATED_PAD | Freq: Every day | CUTANEOUS | Status: DC
  Administered 2022-01-25 – 2022-01-29 (×5): 6 via TOPICAL

## 2022-01-24 NOTE — Progress Notes (Signed)
PROGRESS NOTE    Monique Cabrera  VFI:[id number] DOB: 09-04-1935 DOA: 01/15/2022 PCP: Wardell Honour, MD    Chief Complaint  Patient presents with   Shortness of Breath    Brief Narrative:  87 yo female with hx of diastolic CHF, CKD stage 5(followed by [facility]), type 2 DM, HTN, s/p right BKA, presented to ER with CP and worsening SOB  well over 3 months, which has been progressively worsening. She denies any lower extremity edema. In the ER, pt noted to hypertensive with SBP >200. Pt with difficult IV access. Took several hours to establish PIV. Started on IV ntg gtts. Labs showed troponin 191 --> 244. Cardiology consulted. Scr 7.5 BUN 51. Bicarb 18, BNP >4500. Due to malignant HTN, elevated troponin, CXR consistent with CHF, [facility] consulted for admission.   Assessment & Plan:   Principal Problem:   Malignant hypertension Active Problems:   Type 2 diabetes mellitus with renal complication (HCC)   Status post below-knee amputation of right lower extremity (HCC)   Chronic kidney disease (CKD), stage V (HCC)   Anemia of chronic renal failure   Elevated troponin   Pleural effusion, bilateral   Acute on chronic diastolic CHF (congestive heart failure) (HCC)   DNR (do not resuscitate)/DNI(Do Not Intubate)   Non-ST elevation (NSTEMI) myocardial infarction [facility])   Hypertensive crisis:  Much improved. Increase hydralazine 75 mg TID.  Bp parameters are better today.   NSTEMI:  Echo reviewed.   Cardiology consulted, underwent cath, plan for medical management.  Continue with aspirin and plavix, BB and statin.     Stage V CKD with Uremic symptoms Temp HD cath placed by IR removed by the patient.  Underwent new HD catheter placement, but not functioning. New one exchanged for a new 16 cm. Nephrology on board and following.  Pt tolerating HD. Next HD tomorrow. Will need CLIP.     Anemia of chronic disease:  Transfuse to keep hemoglobin greater than 7.   Hemoglobin stable around 8   Hyponatremia probably from CKD.  Improving.    Type 2 DM:  CBG (last 3)  Recent Labs    01/24/22 0420 01/24/22 0734 01/24/22 1129  GLUCAP 130* 156* 134*    Resume SSI.  Hemoglobin A1c is 5.7%. resume home meds.  No changes in regimen.      DVT prophylaxis: heparin.  Code Status: full code.  Family Communication: None at bedside.  Disposition:   Status is: Inpatient  Remains inpatient appropriate because: further work up with HD       Consultants:  Cardiology Nephrology.   Procedures: cadiac cath HD.   Antimicrobials: none.    Subjective: No chest pain or sob.   Objective: Vitals:   01/24/22 0444 01/24/22 0903 01/24/22 1309 01/24/22 1353  BP:  (!) 143/73 (!) 157/88   Pulse:  77 86 81  Resp:  16  16  Temp:  97.8 F (36.6 C)  97.9 F (36.6 C)  TempSrc:  Oral  Oral  SpO2:  99%  97%  Weight: 61 kg     Height:        Intake/Output Summary (Last 24 hours) at 01/24/2022 1653 Last data filed at 01/24/2022 1500 Gross per 24 hour  Intake 358 ml  Output 0 ml  Net 358 ml    Filed Weights   01/23/22 0810 01/23/22 1146 01/24/22 0444  Weight: 61.1 kg 60.1 kg 61 kg    Examination:  General exam: Appears calm and comfortable  Respiratory system: Clear to auscultation. Respiratory effort normal. Cardiovascular system: S1 & S2 heard, RRR. No JVD,  Gastrointestinal system: Abdomen is nondistended, soft and nontender. . Normal bowel sounds heard. Central nervous system: Alert and oriented. No focal neurological deficits. Extremities: Symmetric 5 x 5 power. Skin: No rashes, . Psychiatry:  Mood & affect appropriate.           Data Reviewed: I have personally reviewed following labs and imaging studies  CBC: Recent Labs  Lab 01/20/22 0546 01/21/22 0614 01/21/22 1133 01/22/22 0218 01/23/22 0236 01/24/22 0454  WBC 8.6 6.9 7.0 7.8 9.2 10.2  NEUTROABS 6.4 5.1  --  5.6 6.9 7.6  HGB 8.3* 8.6* 8.6* 8.5* 8.3*  8.3*  HCT 24.9* 26.3* 25.6* 25.0* 25.8* 26.5*  MCV 94.3 94.3 94.8 94.7 94.9 98.1  PLT 205 207 196 193 187 191     Basic Metabolic Panel: Recent Labs  Lab 01/20/22 0546 01/21/22 0614 01/22/22 0218 01/23/22 0236 01/24/22 0141  NA 129* 132* 130* 132* 133*  K 4.4 4.3 4.0 4.3 3.5  CL 99 101 99 101 98  CO2 15* 16* 17* 16* 23  GLUCOSE 140* 128* 108* 100* 129*  BUN 78* 80* 81* 45* 22  CREATININE 9.23* 8.87* 8.82* 6.10* 4.08*  CALCIUM 8.9 9.2 8.8* 8.7* 8.5*  PHOS 8.3* 7.9* 7.4* 5.3* 3.6     GFR: Estimated Creatinine Clearance: 9.2 mL/min (A) (by C-G formula based on SCr of 4.08 mg/dL (H)).  Liver Function Tests: Recent Labs  Lab 01/20/22 0546 01/21/22 0614 01/22/22 0218 01/23/22 0236 01/24/22 0141  ALBUMIN 2.7* 2.8* 2.8* 2.7* 2.6*     CBG: Recent Labs  Lab 01/23/22 2046 01/24/22 0031 01/24/22 0420 01/24/22 0734 01/24/22 1129  GLUCAP 146* 137* 130* 156* 134*      Recent Results (from the past 240 hour(s))  Resp Panel by RT-PCR (Flu A&B, Covid) Nasopharyngeal Swab     Status: None   Collection Time: 01/15/22  1:36 PM   Specimen: Nasopharyngeal Swab; Nasopharyngeal(NP) swabs in vial transport medium  Result Value Ref Range Status   SARS Coronavirus 2 by RT PCR NEGATIVE NEGATIVE Final    Comment: (NOTE) SARS-CoV-2 target nucleic acids are NOT DETECTED.  The SARS-CoV-2 RNA is generally detectable in upper respiratory specimens during the acute phase of infection. The lowest concentration of SARS-CoV-2 viral copies this assay can detect is 138 copies/mL. A negative result does not preclude SARS-Cov-2 infection and should not be used as the sole basis for treatment or other patient management decisions. A negative result may occur with  improper specimen collection/handling, submission of specimen other than nasopharyngeal swab, presence of viral mutation(s) within the areas targeted by this assay, and inadequate number of viral copies(<138 copies/mL). A  negative result must be combined with clinical observations, patient history, and epidemiological information. The expected result is Negative.  Fact Sheet for Patients:  EntrepreneurPulse.com.au  Fact Sheet for Healthcare Providers:  IncredibleEmployment.be  This test is no t yet approved or cleared by the [place] FDA and  has been authorized for detection and/or diagnosis of SARS-CoV-2 by FDA under an Emergency Use Authorization (EUA). This EUA will remain  in effect (meaning this test can be used) for the duration of the COVID-19 declaration under Section 564(b)(1) of the Act, 21 U.S.C.section 360bbb-3(b)(1), unless the authorization is terminated  or revoked sooner.       Influenza A by PCR NEGATIVE NEGATIVE Final   Influenza B by PCR NEGATIVE NEGATIVE Final  Comment: (NOTE) The Xpert Xpress SARS-CoV-2/FLU/RSV plus assay is intended as an aid in the diagnosis of influenza from Nasopharyngeal swab specimens and should not be used as a sole basis for treatment. Nasal washings and aspirates are unacceptable for Xpert Xpress SARS-CoV-2/FLU/RSV testing.  Fact Sheet for Patients: EntrepreneurPulse.com.au  Fact Sheet for Healthcare Providers: IncredibleEmployment.be  This test is not yet approved or cleared by the [place] FDA and has been authorized for detection and/or diagnosis of SARS-CoV-2 by FDA under an Emergency Use Authorization (EUA). This EUA will remain in effect (meaning this test can be used) for the duration of the COVID-19 declaration under Section 564(b)(1) of the Act, 21 U.S.C. section 360bbb-3(b)(1), unless the authorization is terminated or revoked.  Performed at [facility], [place] [address], [place], [place] [address]           Radiology Studies: No results found.      Scheduled Meds:  aspirin EC  81 mg Oral Daily   atorvastatin  80 mg Oral Daily    calcitRIOL  0.25 mcg Oral Daily   calcium acetate  667 mg Oral TID WC   Chlorhexidine Gluconate Cloth  6 each Topical Daily   Chlorhexidine Gluconate Cloth  6 each Topical Q0600   clopidogrel  75 mg Oral Q breakfast   darbepoetin (ARANESP) injection - DIALYSIS  100 mcg Intravenous Q Tue-HD   hydrALAZINE  75 mg Oral Q8H   insulin aspart  0-6 Units Subcutaneous Q4H   metoCLOPramide (REGLAN) injection  5 mg Intravenous TID WC & HS   metoprolol tartrate  25 mg Oral Q6H   pantoprazole  40 mg Oral Daily   sodium chloride flush  3 mL Intravenous Q12H   sodium chloride flush  3 mL Intravenous Q12H   Continuous Infusions:  sodium chloride       LOS: 9 days        Hosie Poisson, MD [facility]   To contact the attending provider between 7A-7P or the covering provider during after hours 7P-7A, please log into the web site [email or web address] and access using universal [place] password for that web site. If you do not have the password, please call the hospital operator.  01/24/2022, 4:53 PM

## 2022-01-24 NOTE — Progress Notes (Signed)
Subjective:  had second HD yest-  removed 1000-  BUN down to 22 -  she is sleepy this AM-  starting to work with PT    Objective Vital signs in last 24 hours: Vitals:   01/23/22 1946 01/24/22 0414 01/24/22 0444 01/24/22 0903  BP: (!) 159/77 (!) 145/64  (!) 143/73  Pulse: 81 83  77  Resp: 18 16  16   Temp: 97.6 F (36.4 C) 97.8 F (36.6 C)  97.8 F (36.6 C)  TempSrc: Oral Oral  Oral  SpO2: 98% 97%  99%  Weight:   61 kg   Height:       Weight change: 0.2 kg  Intake/Output Summary (Last 24 hours) at 01/24/2022 7858 Last data filed at 01/24/2022 0900 Gross per 24 hour  Intake 360 ml  Output 1000 ml  Net -640 ml    Assessment/ Plan: Pt is a 87 y.o. yo female with known advanced CKD who was admitted on 01/15/2022 with acute coronary event  Assessment/Plan: 1. Acute coronary event-  cath done-  showed blockage but collaterals-  nothing to intervene upon-  medical management -  seems more stable in that regard 2. Renal-  apparently had been asked several times as OP if she wanted dialysis and she always declined even when faced with decision that she could die without HD -  documented in the [facility] record.  Palliative care meeting took place on 1/23 and decision was made to go ahead and pursue HD wanting to do what we could to prolong life.  Now has 3rd temp HD cath in place that appears to be working-  first HD 1/27, second 1/28 and 3rd for Monday 1/30- will need to work on perm cath and av access per VVS next week-  I  have talked with renal navigator regarding this patient regarding CLIP   3. Anemia-  hgb dropping on heparin -   iron stores OK -  have added on esa   4. HTN/volume-  hypertensive-  volume seems heavy-  on nitro/hydralazine/lopressor -   UF as able with HD  5. Bones- started phoslo- PTH 479-  started vitamin D as well      Louis Meckel    Labs: Basic Metabolic Panel: Recent Labs  Lab 01/22/22 0218 01/23/22 0236 01/24/22 0141  NA 130* 132* 133*  K 4.0  4.3 3.5  CL 99 101 98  CO2 17* 16* 23  GLUCOSE 108* 100* 129*  BUN 81* 45* 22  CREATININE 8.82* 6.10* 4.08*  CALCIUM 8.8* 8.7* 8.5*  PHOS 7.4* 5.3* 3.6   Liver Function Tests: Recent Labs  Lab 01/22/22 0218 01/23/22 0236 01/24/22 0141  ALBUMIN 2.8* 2.7* 2.6*   Recent Labs  Lab 01/17/22 1105  LIPASE 23   No results for input(s): AMMONIA in the last 168 hours. CBC: Recent Labs  Lab 01/21/22 0614 01/21/22 1133 01/22/22 0218 01/23/22 0236 01/24/22 0454  WBC 6.9 7.0 7.8 9.2 10.2  NEUTROABS 5.1  --  5.6 6.9 7.6  HGB 8.6* 8.6* 8.5* 8.3* 8.3*  HCT 26.3* 25.6* 25.0* 25.8* 26.5*  MCV 94.3 94.8 94.7 94.9 98.1  PLT 207 196 193 187 191   Cardiac Enzymes: No results for input(s): CKTOTAL, CKMB, CKMBINDEX, TROPONINI in the last 168 hours. CBG: Recent Labs  Lab 01/23/22 1602 01/23/22 2046 01/24/22 0031 01/24/22 0420 01/24/22 0734  GLUCAP 122* 146* 137* 130* 156*    Iron Studies:  No results for input(s): IRON, TIBC, TRANSFERRIN, FERRITIN in the  last 72 hours.  Studies/Results: No results found. Medications: Infusions:  sodium chloride      Scheduled Medications:  aspirin EC  81 mg Oral Daily   atorvastatin  80 mg Oral Daily   calcitRIOL  0.25 mcg Oral Daily   calcium acetate  667 mg Oral TID WC   Chlorhexidine Gluconate Cloth  6 each Topical Daily   Chlorhexidine Gluconate Cloth  6 each Topical Q0600   clopidogrel  75 mg Oral Q breakfast   darbepoetin (ARANESP) injection - DIALYSIS  100 mcg Intravenous Q Tue-HD   hydrALAZINE  75 mg Oral Q8H   insulin aspart  0-6 Units Subcutaneous Q4H   metoCLOPramide (REGLAN) injection  5 mg Intravenous TID WC & HS   metoprolol tartrate  25 mg Oral Q6H   pantoprazole  40 mg Oral Daily   sodium chloride flush  3 mL Intravenous Q12H   sodium chloride flush  3 mL Intravenous Q12H    have reviewed scheduled and prn medications.  Physical Exam: General: thin, seen on HD-   sleepy this AM Heart: tachy  Lungs: mostly  clear Abdomen: soft, non tender Extremities: actually does have some pitting edema  Newer right sided temp HD cath placed 1/26 -  BFR 300     01/24/2022,9:53 AM  LOS: 9 days

## 2022-01-24 NOTE — Evaluation (Signed)
Occupational Therapy Evaluation Patient Details Name: Monique Cabrera MRN: [id number] DOB: [birth year] Today's Date: 01/24/2022   History of Present Illness Pt is an 87 y/o female admitted secondary to HTN urgency and NSTEMI. Pt is s/p heart cath on 1/25. Pt also with problems with HD cath and to IR on 1/26. PMH includes ESRD on HD, R BKA, dCHF, HTN, and DM.   Clinical Impression   Pt admitted with above. She demonstrates the below listed deficits and will benefit from continued OT to maximize safety and independence with BADLs.  Pt presents to OT with generalized weakness, decreased activity tolerance, impaired balance.  She is able to complete ADLs with min guard assist, but does fatigue.  She reports she lives with her daughter, who works.  She reports she is typically modified independent with ADLs and IADLs (does not drive), but has had progressing difficulties with IADLs and community tasks for the past 1-3 months.  Will follow.        Recommendations for follow up therapy are one component of a multi-disciplinary discharge planning process, led by the attending physician.  Recommendations may be updated based on patient status, additional functional criteria and insurance authorization.   Follow Up Recommendations  Home health OT    Assistance Recommended at Discharge Intermittent Supervision/Assistance  Patient can return home with the following Assistance with cooking/housework;A little help with bathing/dressing/bathroom;Assist for transportation;Direct supervision/assist for medications management    Functional Status Assessment  Patient has had a recent decline in their functional status and demonstrates the ability to make significant improvements in function in a reasonable and predictable amount of time.  Equipment Recommendations  None recommended by OT    Recommendations for Other Services       Precautions / Restrictions Precautions Precautions: Fall Precaution  Comments: R BKA at baseline, has prosthetic Required Braces or Orthoses: Other Brace Other Brace: RLE prosthetic Restrictions Weight Bearing Restrictions: No      Mobility Bed Mobility Overal bed mobility: Modified Independent                  Transfers Overall transfer level: Needs assistance Equipment used: Rolling walker (2 wheels) Transfers: Sit to/from Stand, Bed to chair/wheelchair/BSC Sit to Stand: Min guard Stand pivot transfers: Min guard   Step pivot transfers: Min guard            Balance Overall balance assessment: Needs assistance Sitting-balance support: No upper extremity supported Sitting balance-Leahy Scale: Good     Standing balance support: Single extremity supported, Reliant on assistive device for balance Standing balance-Leahy Scale: Poor                             ADL either performed or assessed with clinical judgement   ADL Overall ADL's : Needs assistance/impaired Eating/Feeding: Independent   Grooming: Wash/dry hands;Wash/dry face;Oral care;Sitting   Upper Body Bathing: Set up;Sitting   Lower Body Bathing: Min guard;Sit to/from stand   Upper Body Dressing : Set up;Sitting   Lower Body Dressing: Min guard;Sit to/from stand Lower Body Dressing Details (indicate cue type and reason): able to don prosthesis with min guard for standing - mild balance loss Toilet Transfer: Min guard;Ambulation;BSC/3in1;Rolling walker (2 wheels)   Toileting- Clothing Manipulation and Hygiene: Min guard;Sit to/from stand       Functional mobility during ADLs: Min guard;Rolling walker (2 wheels)       Vision Baseline Vision/History: 1 Wears glasses Patient Visual Report: No  change from baseline       Perception     Praxis      Pertinent Vitals/Pain Pain Assessment Pain Assessment: No/denies pain     Hand Dominance Right   Extremity/Trunk Assessment Upper Extremity Assessment Upper Extremity Assessment: Generalized  weakness   Lower Extremity Assessment Lower Extremity Assessment: Defer to PT evaluation RLE Deficits / Details: R BKA at baseline   Cervical / Trunk Assessment Cervical / Trunk Assessment: Normal   Communication Communication Communication: No difficulties   Cognition Arousal/Alertness: Awake/alert Behavior During Therapy: WFL for tasks assessed/performed Overall Cognitive Status: Within Functional Limits for tasks assessed                                 General Comments: WFL for basic tasks.  May need further assessment of more complex cognition to ensure she can manage medications safely     General Comments  VSS on RA    Exercises     Shoulder Instructions      Home Living Family/patient expects to be discharged to:: Private residence Living Arrangements: Other relatives Available Help at Discharge: Family;Available PRN/intermittently Type of Home: House Home Access: Stairs to enter     Home Layout: One level     Bathroom Shower/Tub: Occupational psychologist: Standard     Home Equipment: Conservation officer, nature (2 wheels);Rollator (4 wheels);Shower seat;Other (comment) (Rt LE prosthesis)   Additional Comments: Pt reports daughter works 2 jobs, and grandson helps her sometimes      Prior Functioning/Environment Prior Level of Function : Independent/Modified Independent             Mobility Comments: Normally independent with use of prosthetic. Will sometimes use rollator ADLs Comments: Pt reports she performs ADLs mod I, but has been struggling with IADLs such as meal prep and community tasks over the past 1-3 months        OT Problem List: Decreased activity tolerance;Decreased strength;Impaired balance (sitting and/or standing)      OT Treatment/Interventions: Self-care/ADL training;Therapeutic exercise;DME and/or AE instruction;Energy conservation;Therapeutic activities;Patient/family education;Cognitive remediation/compensation     OT Goals(Current goals can be found in the care plan section) Acute Rehab OT Goals Patient Stated Goal: to be able to cook for self again OT Goal Formulation: With patient Time For Goal Achievement: 02/07/22 Potential to Achieve Goals: Good ADL Goals Pt Will Perform Grooming: with modified independence;standing Pt Will Perform Lower Body Bathing: with modified independence;sit to/from stand Pt Will Perform Lower Body Dressing: with modified independence;sit to/from stand Pt Will Transfer to Toilet: with modified independence;ambulating;regular height toilet;grab bars Pt Will Perform Toileting - Clothing Manipulation and hygiene: with modified independence;sit to/from stand Pt/caregiver will Perform Home Exercise Program: Increased strength;Right Upper extremity;Left upper extremity;With written HEP provided;With theraband Additional ADL Goal #1: Pt will be indepedent with simulated medication management activity Additional ADL Goal #2: Pt will independently incorporate energy conservation strategies into daily tasks  OT Frequency: Min 2X/week    Co-evaluation              AM-PAC OT "6 Clicks" Daily Activity     Outcome Measure Help from another person eating meals?: None Help from another person taking care of personal grooming?: A Little Help from another person toileting, which includes using toliet, bedpan, or urinal?: A Little Help from another person bathing (including washing, rinsing, drying)?: A Little Help from another person to put on and taking off regular  upper body clothing?: A Little Help from another person to put on and taking off regular lower body clothing?: A Little 6 Click Score: 19   End of Session Equipment Utilized During Treatment: Gait belt;Rolling walker (2 wheels) Nurse Communication: Mobility status  Activity Tolerance: Patient limited by fatigue Patient left: in chair;with call bell/phone within reach;with chair alarm set  OT Visit Diagnosis:  Unsteadiness on feet (R26.81)                Time: 2712-9290 OT Time Calculation (min): 25 min Charges:  OT General Charges $OT Visit: 1 Visit OT Evaluation $OT Eval Moderate Complexity: 1 Mod OT Treatments $Self Care/Home Management : 8-22 mins  Nilsa Nutting., OTR/L Acute Rehabilitation Services Pager [phone] Office [phone]   Lucille Passy M 01/24/2022, 10:31 AM

## 2022-01-25 LAB — GLUCOSE, CAPILLARY
Glucose-Capillary: 118 mg/dL — ABNORMAL HIGH (ref 70–99)
Glucose-Capillary: 114 mg/dL — ABNORMAL HIGH (ref 70–99)
Glucose-Capillary: 102 mg/dL — ABNORMAL HIGH (ref 70–99)
Glucose-Capillary: 122 mg/dL — ABNORMAL HIGH (ref 70–99)

## 2022-01-25 LAB — RENAL FUNCTION PANEL
Albumin: 2.4 g/dL — ABNORMAL LOW (ref 3.5–5.0)
Anion gap: 12 (ref 5–15)
BUN: 26 mg/dL — ABNORMAL HIGH (ref 8–23)
CO2: 24 mmol/L (ref 22–32)
Calcium: 8.5 mg/dL — ABNORMAL LOW (ref 8.9–10.3)
Chloride: 97 mmol/L — ABNORMAL LOW (ref 98–111)
Creatinine, Ser: 5.01 mg/dL — ABNORMAL HIGH (ref 0.44–1.00)
GFR, Estimated: 8 mL/min — ABNORMAL LOW
Glucose, Bld: 120 mg/dL — ABNORMAL HIGH (ref 70–99)
Phosphorus: 3.9 mg/dL (ref 2.5–4.6)
Potassium: 3.4 mmol/L — ABNORMAL LOW (ref 3.5–5.1)
Sodium: 133 mmol/L — ABNORMAL LOW (ref 135–145)

## 2022-01-25 LAB — CBC WITH DIFFERENTIAL/PLATELET
Abs Immature Granulocytes: 0.15 10*3/uL — ABNORMAL HIGH (ref 0.00–0.07)
Basophils Absolute: 0 10*3/uL (ref 0.0–0.1)
Basophils Relative: 0 %
Eosinophils Absolute: 0.2 10*3/uL (ref 0.0–0.5)
Eosinophils Relative: 2 %
HCT: 24.6 % — ABNORMAL LOW (ref 36.0–46.0)
Hemoglobin: 8.1 g/dL — ABNORMAL LOW (ref 12.0–15.0)
Immature Granulocytes: 2 %
Lymphocytes Relative: 11 %
Lymphs Abs: 0.9 10*3/uL (ref 0.7–4.0)
MCH: 31.5 pg (ref 26.0–34.0)
MCHC: 32.9 g/dL (ref 30.0–36.0)
MCV: 95.7 fL (ref 80.0–100.0)
Monocytes Absolute: 1.3 10*3/uL — ABNORMAL HIGH (ref 0.1–1.0)
Monocytes Relative: 16 %
Neutro Abs: 5.6 10*3/uL (ref 1.7–7.7)
Neutrophils Relative %: 69 %
Platelets: 163 10*3/uL (ref 150–400)
RBC: 2.57 MIL/uL — ABNORMAL LOW (ref 3.87–5.11)
RDW: 14.8 % (ref 11.5–15.5)
WBC: 8.2 10*3/uL (ref 4.0–10.5)
nRBC: 0 % (ref 0.0–0.2)

## 2022-01-25 MED ORDER — DARBEPOETIN ALFA 100 MCG/0.5ML IJ SOSY
100.0000 ug | PREFILLED_SYRINGE | INTRAMUSCULAR | Status: DC
  Filled 2022-01-25: qty 0.5

## 2022-01-25 MED ORDER — HEPARIN SODIUM (PORCINE) 1000 UNIT/ML IJ SOLN
INTRAMUSCULAR | Status: AC
  Administered 2022-01-25: 3000 [IU]
  Filled 2022-01-25: qty 3

## 2022-01-25 NOTE — Progress Notes (Signed)
Requested to see pt for out-pt HD arrangements. Met with pt at bedside to discuss role and process. Pt requested that navigator contact pt's dtr, Jody, to discuss details regarding out-pt HD arrangements. Spoke to pt's dtr, Jody, via phone. Dtr advised that pt prefers a clinic close to pt's home if possible. Discussed possible options with pt's dtr who prefers [place] or [facility]. Referral made to [facility] admissions this afternoon. Pt prefers a MWF 2nd shift appt if possible. Pt's dtrs plan to transport pt to/from HD appts. Will follow and assist.    Melven Sartorius Renal Navigator [phone]

## 2022-01-25 NOTE — Progress Notes (Signed)
PROGRESS NOTE    Monique Cabrera  VQM:[id number] DOB: 1935-10-09 DOA: 01/15/2022 PCP: Wardell Honour, MD    Chief Complaint  Patient presents with   Shortness of Breath    Brief Narrative:  87 yo female with hx of diastolic CHF, CKD stage 5(followed by [facility]), type 2 DM, HTN, s/p right BKA, presented to ER with CP and worsening SOB  well over 3 months, which has been progressively worsening. She denies any lower extremity edema. In the ER, pt noted to hypertensive with SBP >200. Pt with difficult IV access. Took several hours to establish PIV. Started on IV ntg gtts. Labs showed troponin 191 --> 244. Cardiology consulted. Scr 7.5 BUN 51. Bicarb 18, BNP >4500. Due to malignant HTN, elevated troponin, CXR consistent with CHF, [facility] hospitalist consulted for admission.  She underwent LHC, cardiology recommended medical management. Meanwhile she was started on HD for stage V CKD. CLIP in progress. She will need perm cath this week.   Assessment & Plan:   Principal Problem:   Malignant hypertension Active Problems:   Type 2 diabetes mellitus with renal complication (HCC)   Status post below-knee amputation of right lower extremity (HCC)   Chronic kidney disease (CKD), stage V (HCC)   Anemia of chronic renal failure   Elevated troponin   Pleural effusion, bilateral   Acute on chronic diastolic CHF (congestive heart failure) (HCC)   DNR (do not resuscitate)/DNI(Do Not Intubate)   Non-ST elevation (NSTEMI) myocardial infarction [facility])   Hypertensive crisis:  Resolved.  Increase hydralazine 75 mg TID.  Bp parameters are better today.   NSTEMI:  Echo reviewed.  Pt denies any chest pain or sob.  Cardiology consulted, underwent cath, plan for medical management.  Continue with aspirin and plavix, BB and statin.     Stage V CKD with Uremic symptoms Temp HD cath placed by IR removed by the patient.  Underwent new HD catheter placement, but not functioning. New one  exchanged for a new 16 cm.  Nephrology on board and following.  CLIP in progress. Will need permanent access.  Will follow.     Anemia of chronic disease:  Transfuse to keep hemoglobin greater than 7.  Hemoglobin stable around 8   Hyponatremia probably from CKD.  Improving.    Type 2 DM:  CBG (last 3)  Recent Labs    01/24/22 1508 01/24/22 2107 01/25/22 0737  GLUCAP 173* 139* 114*    Resume SSI.  Hemoglobin A1c is 5.7%. resume home meds.  No changes in regimen.   Mild hypokalemia:  Monitor.    Mild hyponatremia  Sodium of 133.    DVT prophylaxis: heparin.  Code Status: full code.  Family Communication: None at bedside.  Disposition:   Status is: Inpatient  Remains inpatient appropriate because: further work up with HD       Consultants:  Cardiology Nephrology.   Procedures:  cadiac cath HD.   Antimicrobials: none.    Subjective: No new complaints.   Objective: Vitals:   01/25/22 1000 01/25/22 1030 01/25/22 1100 01/25/22 1210  BP: 129/64 133/60 123/79 (P) 133/73  Pulse:      Resp: 19 (!) 27 (!) 26 (!) (P) 24  Temp:    (P) 98.2 F (36.8 C)  TempSrc:      SpO2: 100% 100% 100%   Weight:    (P) 59.8 kg  Height:        Intake/Output Summary (Last 24 hours) at 01/25/2022 1309 Last data filed  at 01/25/2022 1210 Gross per 24 hour  Intake 118 ml  Output 1500 ml  Net -1382 ml    Filed Weights   01/25/22 0500 01/25/22 0800 01/25/22 1210  Weight: 61 kg 61.1 kg (P) 59.8 kg    Examination:  General exam: Appears calm and comfortable  Respiratory system: Clear to auscultation. Respiratory effort normal. Cardiovascular system: S1 & S2 heard, RRR. No JVD,  No pedal edema. Gastrointestinal system: Abdomen is nondistended, soft and nontender.  Normal bowel sounds heard. Central nervous system: Alert and oriented. No focal neurological deficits. Extremities: Symmetric 5 x 5 power. Skin: No rashes, lesions or ulcers Psychiatry:  Mood &  affect appropriate.            Data Reviewed: I have personally reviewed following labs and imaging studies  CBC: Recent Labs  Lab 01/21/22 0614 01/21/22 1133 01/22/22 0218 01/23/22 0236 01/24/22 0454 01/25/22 0600  WBC 6.9 7.0 7.8 9.2 10.2 8.2  NEUTROABS 5.1  --  5.6 6.9 7.6 5.6  HGB 8.6* 8.6* 8.5* 8.3* 8.3* 8.1*  HCT 26.3* 25.6* 25.0* 25.8* 26.5* 24.6*  MCV 94.3 94.8 94.7 94.9 98.1 95.7  PLT 207 196 193 187 191 163     Basic Metabolic Panel: Recent Labs  Lab 01/21/22 0614 01/22/22 0218 01/23/22 0236 01/24/22 0141 01/25/22 0559  NA 132* 130* 132* 133* 133*  K 4.3 4.0 4.3 3.5 3.4*  CL 101 99 101 98 97*  CO2 16* 17* 16* 23 24  GLUCOSE 128* 108* 100* 129* 120*  BUN 80* 81* 45* 22 26*  CREATININE 8.87* 8.82* 6.10* 4.08* 5.01*  CALCIUM 9.2 8.8* 8.7* 8.5* 8.5*  PHOS 7.9* 7.4* 5.3* 3.6 3.9     GFR: Estimated Creatinine Clearance: 7.5 mL/min (A) (by C-G formula based on SCr of 5.01 mg/dL (H)).  Liver Function Tests: Recent Labs  Lab 01/21/22 0614 01/22/22 0218 01/23/22 0236 01/24/22 0141 01/25/22 0559  ALBUMIN 2.8* 2.8* 2.7* 2.6* 2.4*     CBG: Recent Labs  Lab 01/24/22 0734 01/24/22 1129 01/24/22 1508 01/24/22 2107 01/25/22 0737  GLUCAP 156* 134* 173* 139* 114*      Recent Results (from the past 240 hour(s))  Resp Panel by RT-PCR (Flu A&B, Covid) Nasopharyngeal Swab     Status: None   Collection Time: 01/15/22  1:36 PM   Specimen: Nasopharyngeal Swab; Nasopharyngeal(NP) swabs in vial transport medium  Result Value Ref Range Status   SARS Coronavirus 2 by RT PCR NEGATIVE NEGATIVE Final    Comment: (NOTE) SARS-CoV-2 target nucleic acids are NOT DETECTED.  The SARS-CoV-2 RNA is generally detectable in upper respiratory specimens during the acute phase of infection. The lowest concentration of SARS-CoV-2 viral copies this assay can detect is 138 copies/mL. A negative result does not preclude SARS-Cov-2 infection and should not be used as  the sole basis for treatment or other patient management decisions. A negative result may occur with  improper specimen collection/handling, submission of specimen other than nasopharyngeal swab, presence of viral mutation(s) within the areas targeted by this assay, and inadequate number of viral copies(<138 copies/mL). A negative result must be combined with clinical observations, patient history, and epidemiological information. The expected result is Negative.  Fact Sheet for Patients:  EntrepreneurPulse.com.au  Fact Sheet for Healthcare Providers:  IncredibleEmployment.be  This test is no t yet approved or cleared by the [place] FDA and  has been authorized for detection and/or diagnosis of SARS-CoV-2 by FDA under an Emergency Use Authorization (EUA). This EUA will  remain  in effect (meaning this test can be used) for the duration of the COVID-19 declaration under Section 564(b)(1) of the Act, 21 U.S.C.section 360bbb-3(b)(1), unless the authorization is terminated  or revoked sooner.       Influenza A by PCR NEGATIVE NEGATIVE Final   Influenza B by PCR NEGATIVE NEGATIVE Final    Comment: (NOTE) The Xpert Xpress SARS-CoV-2/FLU/RSV plus assay is intended as an aid in the diagnosis of influenza from Nasopharyngeal swab specimens and should not be used as a sole basis for treatment. Nasal washings and aspirates are unacceptable for Xpert Xpress SARS-CoV-2/FLU/RSV testing.  Fact Sheet for Patients: EntrepreneurPulse.com.au  Fact Sheet for Healthcare Providers: IncredibleEmployment.be  This test is not yet approved or cleared by the [place] FDA and has been authorized for detection and/or diagnosis of SARS-CoV-2 by FDA under an Emergency Use Authorization (EUA). This EUA will remain in effect (meaning this test can be used) for the duration of the COVID-19 declaration under Section 564(b)(1) of  the Act, 21 U.S.C. section 360bbb-3(b)(1), unless the authorization is terminated or revoked.  Performed at [facility], [place] [address] [place], [place] [address]           Radiology Studies: No results found.      Scheduled Meds:  aspirin EC  81 mg Oral Daily   atorvastatin  80 mg Oral Daily   calcitRIOL  0.25 mcg Oral Daily   calcium acetate  667 mg Oral TID WC   Chlorhexidine Gluconate Cloth  6 each Topical Daily   Chlorhexidine Gluconate Cloth  6 each Topical Q0600   clopidogrel  75 mg Oral Q breakfast   [START ON 01/27/2022] darbepoetin (ARANESP) injection - DIALYSIS  100 mcg Intravenous Q Wed-HD   hydrALAZINE  75 mg Oral Q8H   insulin aspart  0-6 Units Subcutaneous Q4H   metoCLOPramide (REGLAN) injection  5 mg Intravenous TID WC & HS   metoprolol tartrate  25 mg Oral Q6H   pantoprazole  40 mg Oral Daily   sodium chloride flush  3 mL Intravenous Q12H   sodium chloride flush  3 mL Intravenous Q12H   Continuous Infusions:  sodium chloride       LOS: 10 days        Hosie Poisson, MD [facility]   To contact the attending provider between 7A-7P or the covering provider during after hours 7P-7A, please log into the web site [email or web address] and access using universal [place] password for that web site. If you do not have the password, please call the hospital operator.  01/25/2022, 1:09 PM

## 2022-01-25 NOTE — TOC Initial Note (Signed)
Transition of Care [facility]) - Initial/Assessment Note    Patient Details  Name: Monique Cabrera MRN: [id number] Date of Birth: 1935-10-11  Transition of Care [facility]) CM/SW Contact:    Bethena Roys, RN Phone Number: 01/25/2022, 3:43 PM  Clinical Narrative:   Risk for readmission assessment completed. Prior to arrival patient was from home with daughter Otila Kluver. Patient reports that she has a cane in the home. Patient states that her daughters take her to appointments and she has no issues obtaining medications. Case Manager discussed home health services with the patient and she feels that she will benefit from services. Patient asked Case Manager to speak with daughter Otila Kluver on 01-26-22 since she is at work today to discuss agency of choice. Renal Navigator is following the patient for outpatient HD Center. Case Manager will continue to follow for additional needs.                 Expected Discharge Plan: [place] Barriers to Discharge: Continued Medical Work up   Patient Goals and CMS Choice Patient states their goals for this hospitalization and ongoing recovery are:: to return home      Expected Discharge Plan and Services Expected Discharge Plan: [place] In-house Referral: Hospice / [place] (Renal Navigator.) Discharge Planning Services: CM Consult Post Acute Care Choice: [place] arrangements for the past 2 months: Single Family Home                 DME Arranged: N/A DME Agency: NA       HH Arranged: PT, OT          Prior Living Arrangements/Services Living arrangements for the past 2 months: Single Family Home Lives with:: Adult Children (Patient lives with Otila Kluver) Patient language and need for interpreter reviewed:: Yes Do you feel safe going back to the place where you live?: Yes      Need for Family Participation in Patient Care: Yes (Comment) Care giver support system in place?: Yes (comment) Current  home services: DME (Patient has a cane in the home.) Criminal Activity/Legal Involvement Pertinent to Current Situation/Hospitalization: No - Comment as needed  Activities of Daily Living Home Assistive Devices/Equipment: Prosthesis    Permission Sought/Granted Permission sought to share information with : Facility Sport and exercise psychologist, Case Optician, dispensing granted to share information with : Yes, Verbal Permission Granted        Permission granted to share info w Relationship: Otila Kluver Daughter     Emotional Assessment Appearance:: Malodorous Attitude/Demeanor/Rapport: Engaged Affect (typically observed): Appropriate Orientation: : Oriented to Situation, Oriented to  Time, Oriented to Place, Oriented to Self Alcohol / Substance Use: Not Applicable Psych Involvement: No (comment)  Admission diagnosis:  Malignant hypertension [I10] SOB (shortness of breath) [R06.02] Pleural effusion [J90] Hypertensive emergency [I16.1] Chest pain, unspecified type [R07.9] Patient Active Problem List   Diagnosis Date Noted   Non-ST elevation (NSTEMI) myocardial infarction ([place])    Malignant hypertension 01/15/2022   Elevated troponin 01/15/2022   Pleural effusion, bilateral 01/15/2022   Acute on chronic diastolic CHF (congestive heart failure) ([place]) 01/15/2022   Dyspnea 11/27/2021   Chronic kidney disease (CKD), stage V ([place]) 10/02/2021   Anemia of chronic renal failure 10/02/2021   PAD (peripheral artery disease) ([place]) 05/21/2020   DNR (do not resuscitate)/DNI(Do Not Intubate) 04/08/2020   Status post below-knee amputation of right lower extremity ([place]) 11/07/2019   Hyponatremia 10/29/2019   Chronic diastolic CHF (congestive heart failure) ([place])  10/29/2019   Stable proliferative diabetic retinopathy of both eyes associated with type 2 diabetes mellitus ([place]) 06/05/2018   Internuclear ophthalmoplegia of left eye    Benign paroxysmal positional vertigo    Abnormality of gait    Vertigo  02/17/2017   Atherosclerosis of native artery of extremity with intermittent claudication ([place]) 03/11/2014   Diabetic retinopathy ([place]) 03/11/2014   Retinal hemorrhage due to secondary diabetes ([place]) 03/11/2014   Type 2 diabetes mellitus with renal complication ([place]) 25/95/6387   Chronic hepatitis C without hepatic coma ([place]) 03/11/2014   Hyperlipidemia 03/11/2014   Hypoglycemia 04/16/2013   Disorder of bone and cartilage, unspecified    Other and unspecified hyperlipidemia    Essential hypertension, benign    Atherosclerosis of native artery of extremity ([place])    Peripheral arterial disease ([place])    Anemia    Postherpetic neuralgia    Hypertension    PCP:  Wardell Honour, MD Pharmacy:   [id number] - [place], [address] [place] [address] Phone: [phone] Fax: [phone]    Readmission Risk Interventions Readmission Risk Prevention Plan 01/25/2022 11/02/2019  Transportation Screening Complete Complete  PCP or Specialist Appt within 3-5 Days Complete Complete  HRI or Home Care Consult Complete Complete  Social Work Consult for [place] Planning/Counseling Complete Complete  Palliative Care Screening Complete Not Applicable  Medication Review Press photographer) Complete Referral to Pharmacy  Some recent data might be hidden

## 2022-01-25 NOTE — Progress Notes (Signed)
Mobility Specialist Progress Note    01/25/22 1603  Mobility  Activity Transferred from chair to bed  Level of Assistance Contact guard assist, steadying assist  Assistive Device Front wheel walker  Distance Ambulated (ft) 2 ft  Activity Response Tolerated well  $Mobility charge 1 Mobility   Pt left with RN present.   [facility] Mobility Specialist  M.S. 2C and 6E: [phone] M.S. 4E: [phone]

## 2022-01-25 NOTE — Progress Notes (Signed)
PT Cancellation Note  Patient Details Name: Monique Cabrera MRN: [id number] DOB: 10/16/1935   Cancelled Treatment:    Reason Eval/Treat Not Completed: Patient at procedure or test/unavailable (HD)   Monique Cabrera 01/25/2022, 11:18 AM Bayard Males, PT Acute Rehabilitation Services Pager: [phone] Office: [phone]

## 2022-01-25 NOTE — Progress Notes (Signed)
Subjective: Patient somewhat confused and slow to wake up this morning.  Denies any issues.  Objective Vital signs in last 24 hours: Vitals:   01/25/22 0900 01/25/22 0930 01/25/22 1000 01/25/22 1030  BP: 136/69 (!) 135/56 129/64 133/60  Pulse:      Resp: (!) 24 16 19  (!) 27  Temp:      TempSrc:      SpO2: 100% 100% 100% 100%  Weight:      Height:       Weight change: -0.1 kg  Intake/Output Summary (Last 24 hours) at 01/25/2022 1107 Last data filed at 01/24/2022 1500 Gross per 24 hour  Intake 118 ml  Output --  Net 118 ml    Assessment/ Plan: Pt is a 87 y.o. yo female with known advanced CKD who was admitted on 01/15/2022 with acute coronary event  Assessment/Plan: 1. Acute coronary event-  cath done-  showed blockage but collaterals-  nothing to intervene upon-  medical management -  seems more stable in that regard  2. Renal-  apparently had been asked several times as OP if she wanted dialysis and she always declined even when faced with decision that she could die without HD -  documented in the [facility] record.  Palliative care meeting took place on 1/23 and decision was made to go ahead and pursue HD wanting to do what we could to prolong life.    Planning for dialysis today and then maintain MWF schedule. Undergoing CLIP process. Started discussions regarding AVF creation and patient hesitant today. Will continue discussions and possibly involve family. Likely involve VVS tomorrow with AVF + transition temp cath to [facility].   3. Anemia-hemoglobin stable around 8.  Iron stores replete.  ESA on board.  Consider increasing  4. HTN/volume-blood pressure has improved with medications and optimization of fluid status  5. Bones- started phoslo and vitamin D based on labs     Reesa Chew    Labs: Basic Metabolic Panel: Recent Labs  Lab 01/23/22 0236 01/24/22 0141 01/25/22 0559  NA 132* 133* 133*  K 4.3 3.5 3.4*  CL 101 98 97*  CO2 16* 23 24  GLUCOSE 100* 129* 120*   BUN 45* 22 26*  CREATININE 6.10* 4.08* 5.01*  CALCIUM 8.7* 8.5* 8.5*  PHOS 5.3* 3.6 3.9   Liver Function Tests: Recent Labs  Lab 01/23/22 0236 01/24/22 0141 01/25/22 0559  ALBUMIN 2.7* 2.6* 2.4*   No results for input(s): LIPASE, AMYLASE in the last 168 hours.  No results for input(s): AMMONIA in the last 168 hours. CBC: Recent Labs  Lab 01/21/22 1133 01/22/22 0218 01/23/22 0236 01/24/22 0454 01/25/22 0600  WBC 7.0 7.8 9.2 10.2 8.2  NEUTROABS  --  5.6 6.9 7.6 5.6  HGB 8.6* 8.5* 8.3* 8.3* 8.1*  HCT 25.6* 25.0* 25.8* 26.5* 24.6*  MCV 94.8 94.7 94.9 98.1 95.7  PLT 196 193 187 191 163   Cardiac Enzymes: No results for input(s): CKTOTAL, CKMB, CKMBINDEX, TROPONINI in the last 168 hours. CBG: Recent Labs  Lab 01/24/22 0734 01/24/22 1129 01/24/22 1508 01/24/22 2107 01/25/22 0737  GLUCAP 156* 134* 173* 139* 114*    Iron Studies:  No results for input(s): IRON, TIBC, TRANSFERRIN, FERRITIN in the last 72 hours.  Studies/Results: No results found. Medications: Infusions:  sodium chloride      Scheduled Medications:  aspirin EC  81 mg Oral Daily   atorvastatin  80 mg Oral Daily   calcitRIOL  0.25 mcg Oral Daily  calcium acetate  667 mg Oral TID WC   Chlorhexidine Gluconate Cloth  6 each Topical Daily   Chlorhexidine Gluconate Cloth  6 each Topical Q0600   clopidogrel  75 mg Oral Q breakfast   darbepoetin (ARANESP) injection - DIALYSIS  100 mcg Intravenous Q Tue-HD   heparin sodium (porcine)       hydrALAZINE  75 mg Oral Q8H   insulin aspart  0-6 Units Subcutaneous Q4H   metoCLOPramide (REGLAN) injection  5 mg Intravenous TID WC & HS   metoprolol tartrate  25 mg Oral Q6H   pantoprazole  40 mg Oral Daily   sodium chloride flush  3 mL Intravenous Q12H   sodium chloride flush  3 mL Intravenous Q12H    have reviewed scheduled and prn medications.  Physical Exam: General: thin, lying in bed, no distress Heart: tachy, no audible murmur Lungs: Bilateral  chest rise with no increased work of breathing Abdomen: soft, non tender Extremities: actually does have some pitting edema  neuro: Awake, oriented to person and place but not situation    01/25/2022,11:07 AM  LOS: 10 days

## 2022-01-25 NOTE — Progress Notes (Signed)
Physical Therapy Treatment Patient Details Name: Monique Cabrera MRN: [id number] DOB: 07/10/1935 Today's Date: 01/25/2022   History of Present Illness 87 y/o female admitted 1/20 with HTN urgency and NSTEMI. Pt is s/p heart cath via Rt groin on 1/25. Pt has begun HD since admission with 3 temp HD caths placed with last in IR on 1/26. PMHx:CKD, R BKA, dCHF, HTN, DM, diabetic retinopathy, PAD, hep C.    PT Comments    Pt progressing steadily towards her physical therapy goals, exhibiting improved activity tolerance and ambulation distance. She is very motivated to get out of the bed. Pt ambulating ~60 ft with a walker at a min guard assist level. Pt endorses feeling weaker than baseline. Will benefit from HHPT to address.     Recommendations for follow up therapy are one component of a multi-disciplinary discharge planning process, led by the attending physician.  Recommendations may be updated based on patient status, additional functional criteria and insurance authorization.  Follow Up Recommendations  Home health PT     Assistance Recommended at Discharge Frequent or constant Supervision/Assistance  Patient can return home with the following A little help with walking and/or transfers;A little help with bathing/dressing/bathroom;Help with stairs or ramp for entrance;Assist for transportation;Assistance with cooking/housework   Equipment Recommendations  None recommended by PT    Recommendations for Other Services       Precautions / Restrictions Precautions Precautions: Fall Precaution Comments: R BKA at baseline, has prosthetic Restrictions Weight Bearing Restrictions: No     Mobility  Bed Mobility Overal bed mobility: Modified Independent                  Transfers Overall transfer level: Needs assistance Equipment used: Rolling walker (2 wheels) Transfers: Sit to/from Stand Sit to Stand: Min guard, Min assist           General transfer comment: Min guard  for standing up from edge of bed, minA to rise from chair with no arms    Ambulation/Gait Ambulation/Gait assistance: Min guard Gait Distance (Feet): 60 Feet Assistive device: Rolling walker (2 wheels) Gait Pattern/deviations: Step-through pattern, Decreased stride length Gait velocity: decreased     General Gait Details: Pt ambulating ~50 ft, then an additional ~10 ft with a seated rest break. decreased R pelvic rotation   Stairs             Wheelchair Mobility    Modified Rankin (Stroke Patients Only)       Balance Overall balance assessment: Needs assistance Sitting-balance support: No upper extremity supported Sitting balance-Leahy Scale: Good     Standing balance support: Bilateral upper extremity supported, During functional activity, Reliant on assistive device for balance Standing balance-Leahy Scale: Poor                              Cognition Arousal/Alertness: Awake/alert Behavior During Therapy: WFL for tasks assessed/performed Overall Cognitive Status: Within Functional Limits for tasks assessed                                          Exercises      General Comments        Pertinent Vitals/Pain Pain Assessment Pain Assessment: No/denies pain    Home Living  Prior Function            PT Goals (current goals can now be found in the care plan section) Acute Rehab PT Goals Patient Stated Goal: get stronger, walk, get up to chair and bedside commode Potential to Achieve Goals: Good Progress towards PT goals: Progressing toward goals    Frequency    Min 3X/week      PT Plan Current plan remains appropriate    Co-evaluation              AM-PAC PT "6 Clicks" Mobility   Outcome Measure  Help needed turning from your back to your side while in a flat bed without using bedrails?: None Help needed moving from lying on your back to sitting on the side of a flat  bed without using bedrails?: None Help needed moving to and from a bed to a chair (including a wheelchair)?: A Little Help needed standing up from a chair using your arms (e.g., wheelchair or bedside chair)?: A Little Help needed to walk in hospital room?: A Little Help needed climbing 3-5 steps with a railing? : A Lot 6 Click Score: 19    End of Session Equipment Utilized During Treatment: Gait belt Activity Tolerance: Patient tolerated treatment well Patient left: in chair;with call bell/phone within reach;with chair alarm set;with nursing/sitter in room Nurse Communication: Mobility status PT Visit Diagnosis: Unsteadiness on feet (R26.81);Muscle weakness (generalized) (M62.81)     Time: 1314-3888 PT Time Calculation (min) (ACUTE ONLY): 20 min  Charges:  $Therapeutic Activity: 8-22 mins                     Wyona Almas, PT, DPT Acute Rehabilitation Services Pager [phone] Office [phone]    Deno Etienne 01/25/2022, 3:05 PM

## 2022-01-25 NOTE — Progress Notes (Signed)
Palliative Care Progress Note, Assessment & Plan   Patient Name: Monique Cabrera       Date: 01/25/2022 DOB: 1935/07/16  Age: 87 y.o. MRN#: [id number] Attending Physician: Hosie Poisson, MD Primary Care Physician: Wardell Honour, MD Admit Date: 01/15/2022  Reason for Consultation/Follow-up: Establishing goals of care  Subjective: Patient is sitting in bed and receiving bedside HD.  She greets me and is able to make her wishes known.  She is in no apparent distress and denies pain.  She states she is cold and I adjusted her gown and blankets for comfort.  HPI: 87 y.o. female  with past medical history of diastolic CHF, CKD (stage V), type 2 diabetes, HTN, and status post right BKA admitted on 01/15/2022 with hypertensive crisis, chest pain, shortness of breath with associated nausea and vomiting.  On admission patient found to have bilateral pleural effusions.   After family meeting on 1/23, patient is full code/full scope.  Patient has agreed to hemodialysis and cardiac cath.  Cardiology recommends medical management.  Temporary HD cath was placed by IR but removed by patient.  Patient received new HD catheter but was not functioning.  Catheter exchanged for 16 cm.  After several complications from HD access patient tolerated HD on 1/27 and 1/29.  Summary of counseling/coordination of care: After reviewing the patient's chart and assessing the patient at bedside, I spoke with patient regarding her current health status.  She shared she does not really know what is going on but that she does everything is happening to try to make her feel better.  She shares she is feeling better but has moments of nervousness.  Therapeutic silence and active listening provided for patient to share her thoughts and emotions  regarding her nervousness and current health status.  She shares she has put everything in God's hands and does not need to be bothered with the details of what is happening to her medically.  Education provided that she is receiving hemodialysis to help filter out her blood.  I shared her kidneys are no longer functioning and that these machines are doing the job that her kidneys can no longer do.  Patient seems surprised by this but also said thank you for explaining it.  Also shared that she had entered the hospital with severe nausea, vomiting, and chest pain.  Patient denies all those currently.  I asked if she was having any of these issues overnight and she denied them.  She says she feels fine and has no complaints right now.  Questions and concerns were addressed.  I shared that palliative medicine team will continue to follow the patient throughout her hospitalization.  We will shadow her chart for now and intervene if medical team has palliative needs, if patient declines, or if family requests.  Code Status: Full code  Prognosis: Unable to determine  Discharge Planning: To Be Determined  Recommendations/Plan: Goals remain clear-full code/full scope  Care plan was discussed with patient  Physical Exam Vitals and nursing note reviewed.  Constitutional:      General: She is not in acute distress.    Appearance: She is well-developed. She is not toxic-appearing.  HENT:  Head: Normocephalic and atraumatic.  Cardiovascular:     Rate and Rhythm: Normal rate.  Pulmonary:     Effort: Pulmonary effort is normal.  Abdominal:     Palpations: Abdomen is soft.  Musculoskeletal:     Comments: Generalized weakness  Skin:    General: Skin is warm.  Neurological:     Mental Status: She is alert and oriented to person, place, and time.     Comments: Alert to self, place, and situation, but cannot give details of hospitalization or current medical treatment plans  Psychiatric:         Mood and Affect: Mood normal. Mood is not anxious.        Behavior: Behavior is not agitated.            Palliative Assessment/Data: 50%    Total Time 25 minutes  Greater than 50%  of this time was spent counseling and coordinating care related to the above assessment and plan.  Thank you for allowing the Palliative Medicine Team to assist in the care of this patient.  [place] Ilsa Iha, FNP-BC Palliative Medicine Team Team Phone # [phone]

## 2022-01-25 NOTE — Care Management Important Message (Signed)
Important Message  Patient Details  Name: Monique Cabrera MRN: [id number] Date of Birth: 10-08-1935   Medicare Important Message Given:  Yes     Shelda Altes 01/25/2022, 8:19 AM

## 2022-01-26 ENCOUNTER — Encounter (HOSPITAL_COMMUNITY): Payer: Medicare Other

## 2022-01-26 ENCOUNTER — Inpatient Hospital Stay (HOSPITAL_COMMUNITY)
Admission: RE | Admit: 2022-01-26 | Discharge: 2022-01-26 | Disposition: A | Discharge status: Home / Self Care | Payer: Medicare Other | Source: Ambulatory Visit | Attending: Internal Medicine | Admitting: Internal Medicine

## 2022-01-26 DIAGNOSIS — N186 End stage renal disease: Secondary | ICD-10-CM

## 2022-01-26 LAB — RENAL FUNCTION PANEL
Albumin: 2.4 g/dL — ABNORMAL LOW (ref 3.5–5.0)
Anion gap: 10 (ref 5–15)
BUN: 12 mg/dL (ref 8–23)
CO2: 25 mmol/L (ref 22–32)
Calcium: 8.6 mg/dL — ABNORMAL LOW (ref 8.9–10.3)
Chloride: 98 mmol/L (ref 98–111)
Creatinine, Ser: 3.42 mg/dL — ABNORMAL HIGH (ref 0.44–1.00)
GFR, Estimated: 13 mL/min — ABNORMAL LOW (ref >60)
Glucose, Bld: 122 mg/dL — ABNORMAL HIGH (ref 70–99)
Phosphorus: 2.7 mg/dL (ref 2.5–4.6)
Potassium: 3.4 mmol/L — ABNORMAL LOW (ref 3.5–5.1)
Sodium: 133 mmol/L — ABNORMAL LOW (ref 135–145)

## 2022-01-26 LAB — CBC WITH DIFFERENTIAL/PLATELET
Abs Immature Granulocytes: 0.12 10*3/uL — ABNORMAL HIGH (ref 0.00–0.07)
Basophils Absolute: 0 10*3/uL (ref 0.0–0.1)
Basophils Relative: 0 %
Eosinophils Absolute: 0.1 10*3/uL (ref 0.0–0.5)
Eosinophils Relative: 1 %
HCT: 25.9 % — ABNORMAL LOW (ref 36.0–46.0)
Hemoglobin: 8.2 g/dL — ABNORMAL LOW (ref 12.0–15.0)
Immature Granulocytes: 2 %
Lymphocytes Relative: 10 %
Lymphs Abs: 0.8 10*3/uL (ref 0.7–4.0)
MCH: 30.8 pg (ref 26.0–34.0)
MCHC: 31.7 g/dL (ref 30.0–36.0)
MCV: 97.4 fL (ref 80.0–100.0)
Monocytes Absolute: 1.5 10*3/uL — ABNORMAL HIGH (ref 0.1–1.0)
Monocytes Relative: 19 %
Neutro Abs: 5.2 10*3/uL (ref 1.7–7.7)
Neutrophils Relative %: 68 %
Platelets: 170 10*3/uL (ref 150–400)
RBC: 2.66 MIL/uL — ABNORMAL LOW (ref 3.87–5.11)
RDW: 14.8 % (ref 11.5–15.5)
WBC: 7.7 10*3/uL (ref 4.0–10.5)
nRBC: 0 % (ref 0.0–0.2)

## 2022-01-26 LAB — GLUCOSE, CAPILLARY
Glucose-Capillary: 111 mg/dL — ABNORMAL HIGH (ref 70–99)
Glucose-Capillary: 121 mg/dL — ABNORMAL HIGH (ref 70–99)
Glucose-Capillary: 120 mg/dL — ABNORMAL HIGH (ref 70–99)
Glucose-Capillary: 126 mg/dL — ABNORMAL HIGH (ref 70–99)
Glucose-Capillary: 114 mg/dL — ABNORMAL HIGH (ref 70–99)

## 2022-01-26 MED ORDER — INSULIN ASPART 100 UNIT/ML IJ SOLN: 0.0000 [IU] | Freq: Three times a day (TID) | INTRAMUSCULAR | Status: DC

## 2022-01-26 MED ORDER — METOPROLOL TARTRATE 50 MG PO TABS
50.0000 mg | ORAL_TABLET | Freq: Two times a day (BID) | ORAL | Status: DC
  Administered 2022-01-26 – 2022-01-29 (×5): 50 mg via ORAL
  Filled 2022-01-26 (×5): qty 1

## 2022-01-26 NOTE — Progress Notes (Signed)
Occupational Therapy Treatment Patient Details Name: Monique Cabrera MRN: [id number] DOB: Aug 26, 1935 Today's Date: 01/26/2022   History of present illness 87 y/o female admitted 1/20 with HTN urgency and NSTEMI. Pt is s/p heart cath via Rt groin on 1/25. Pt has begun HD since admission with 3 temp HD caths placed with last in IR on 1/26. PMHx:CKD, R BKA, dCHF, HTN, DM, diabetic retinopathy, PAD, hep C.   OT comments  Patient received sitting in recliner and agreeable to OT session. Patient performed BUE strengthening exercises with level 1 theraband and instructions on exercises. Patient able to donn left shoe and required min assist to stand from recliner and min guard to ambulate to sink for grooming tasks. Patient was reliant on sink for balance while standing. Patient making good progress with OT treatment. Acute OT to continue to follow.    Recommendations for follow up therapy are one component of a multi-disciplinary discharge planning process, led by the attending physician.  Recommendations may be updated based on patient status, additional functional criteria and insurance authorization.    Follow Up Recommendations  Home health OT    Assistance Recommended at Discharge Intermittent Supervision/Assistance  Patient can return home with the following  Assistance with cooking/housework;A little help with bathing/dressing/bathroom;Assist for transportation;Direct supervision/assist for medications management   Equipment Recommendations  None recommended by OT    Recommendations for Other Services      Precautions / Restrictions Precautions Precautions: Fall Precaution Comments: R BKA at baseline, has prosthetic Required Braces or Orthoses: Other Brace Other Brace: RLE prosthetic       Mobility Bed Mobility Overal bed mobility: Modified Independent             General bed mobility comments: up in recliner    Transfers Overall transfer level: Needs  assistance Equipment used: Rolling walker (2 wheels) Transfers: Sit to/from Stand Sit to Stand: Min assist     Step pivot transfers: Min guard     General transfer comment: min assist to power up and min guard while up for mobility and transfers     Balance Overall balance assessment: Needs assistance Sitting-balance support: No upper extremity supported Sitting balance-Leahy Scale: Good     Standing balance support: Bilateral upper extremity supported, Single extremity supported, During functional activity Standing balance-Leahy Scale: Poor Standing balance comment: Reliant on at least 1 UE support during grooming tasks                           ADL either performed or assessed with clinical judgement   ADL Overall ADL's : Needs assistance/impaired     Grooming: Wash/dry hands;Wash/dry face;Supervision/safety;Standing Grooming Details (indicate cue type and reason): stood at sink for grooming             Lower Body Dressing: Supervision/safety Lower Body Dressing Details (indicate cue type and reason): donned left shoe               General ADL Comments: performed grooming standing at sink    Extremity/Trunk Assessment              Vision       Perception     Praxis      Cognition Arousal/Alertness: Awake/alert Behavior During Therapy: WFL for tasks assessed/performed Overall Cognitive Status: Within Functional Limits for tasks assessed  General Comments: unsure of day of week.  Believed she had lunch delivered earlier and it arrived during visit        Exercises Exercises: General Upper Extremity General Exercises - Upper Extremity Shoulder Flexion: Strengthening, Both, 10 reps, Seated, Theraband Theraband Level (Shoulder Flexion): Level 1 (Yellow) Shoulder ABduction: Strengthening, Both, 10 reps, Seated, Theraband Theraband Level (Shoulder Abduction): Level 1 (Yellow) Elbow Flexion:  Strengthening, Both, 10 reps, Theraband Theraband Level (Elbow Flexion): Level 1 (Yellow) Elbow Extension: Strengthening, Both, 10 reps, Seated, Theraband Theraband Level (Elbow Extension): Level 1 (Yellow)    Shoulder Instructions       General Comments      Pertinent Vitals/ Pain       Pain Assessment Pain Assessment: No/denies pain  Home Living                                          Prior Functioning/Environment              Frequency  Min 2X/week        Progress Toward Goals  OT Goals(current goals can now be found in the care plan section)  Progress towards OT goals: Progressing toward goals  Acute Rehab OT Goals Patient Stated Goal: go home OT Goal Formulation: With patient Time For Goal Achievement: 02/07/22 Potential to Achieve Goals: Good ADL Goals Pt Will Perform Grooming: with modified independence;standing Pt Will Perform Lower Body Bathing: with modified independence;sit to/from stand Pt Will Perform Lower Body Dressing: with modified independence;sit to/from stand Pt Will Transfer to Toilet: with modified independence;ambulating;regular height toilet;grab bars Pt Will Perform Toileting - Clothing Manipulation and hygiene: with modified independence;sit to/from stand Pt/caregiver will Perform Home Exercise Program: Increased strength;Right Upper extremity;Left upper extremity;With written HEP provided;With theraband Additional ADL Goal #1: Pt will be indepedent with simulated medication management activity Additional ADL Goal #2: Pt will independently incorporate energy conservation strategies into daily tasks  Plan Discharge plan remains appropriate    Co-evaluation                 AM-PAC OT "6 Clicks" Daily Activity     Outcome Measure   Help from another person eating meals?: None Help from another person taking care of personal grooming?: A Little Help from another person toileting, which includes using toliet,  bedpan, or urinal?: A Little Help from another person bathing (including washing, rinsing, drying)?: A Little Help from another person to put on and taking off regular upper body clothing?: A Little Help from another person to put on and taking off regular lower body clothing?: A Little 6 Click Score: 19    End of Session Equipment Utilized During Treatment: Gait belt;Rolling walker (2 wheels)  OT Visit Diagnosis: Unsteadiness on feet (R26.81)   Activity Tolerance Patient limited by fatigue   Patient Left in chair;with call bell/phone within reach;with chair alarm set   Nurse Communication Mobility status        Time: 2563-8937 OT Time Calculation (min): 34 min  Charges: OT General Charges $OT Visit: 1 Visit OT Treatments $Self Care/Home Management : 8-22 mins $Therapeutic Exercise: 8-22 mins  Lodema Hong, OTA Acute Rehabilitation Services  Pager [phone] Office [place] 01/26/2022, 2:07 PM

## 2022-01-26 NOTE — Progress Notes (Signed)
Subjective: Patient more awake and alert today.  Denies any issues with dialysis yesterday.  Discussed that she is unlikely to have recovery of kidney function and that she will need a more permanent option for dialysis.  She now understands this and is willing to see vascular surgery.  Objective Vital signs in last 24 hours: Vitals:   01/25/22 1317 01/25/22 2052 01/26/22 0444 01/26/22 0500  BP:  (!) 154/70 (!) 149/74   Pulse:  75 79   Resp: 20 18    Temp:  97.8 F (36.6 C) 98.3 F (36.8 C)   TempSrc:  Axillary Oral   SpO2:  98% 95%   Weight:    60 kg  Height:       Weight change: 0.1 kg  Intake/Output Summary (Last 24 hours) at 01/26/2022 1000 Last data filed at 01/25/2022 1900 Gross per 24 hour  Intake 240 ml  Output 1500 ml  Net -1260 ml    Assessment/ Plan: Pt is a 87 y.o. yo female with known advanced CKD who was admitted on 01/15/2022 with acute coronary event  Assessment/Plan: 1. Acute coronary event-  cath done-  showed blockage but collaterals-  nothing to intervene upon-  medical management -  seems more stable in that regard  2. Renal-  apparently had been asked several times as OP if she wanted dialysis and she always declined even when faced with decision that she could die without HD -  documented in the [facility] record.  Palliative care meeting took place on 1/23 and decision was made to go ahead and pursue HD wanting to do what we could to prolong life.    Maintain MWF schedule for now. Undergoing CLIP process. Started discussions regarding AVF creation and patient was initially hesitant but more open to it today.   -Consult VVS for transition of temporary HD cath to tunneled HD cath + AVF/G. Appreciate help   3. Anemia-hemoglobin stable around 8.  Iron stores replete.  ESA on board.  Consider increasing  4. HTN/volume-blood pressure has improved with medications and optimization of fluid status  5. Bones- started phoslo and vitamin D based on labs     Reesa Chew    Labs: Basic Metabolic Panel: Recent Labs  Lab 01/24/22 0141 01/25/22 0559 01/26/22 0706  NA 133* 133* 133*  K 3.5 3.4* 3.4*  CL 98 97* 98  CO2 23 24 25   GLUCOSE 129* 120* 122*  BUN 22 26* 12  CREATININE 4.08* 5.01* 3.42*  CALCIUM 8.5* 8.5* 8.6*  PHOS 3.6 3.9 2.7   Liver Function Tests: Recent Labs  Lab 01/24/22 0141 01/25/22 0559 01/26/22 0706  ALBUMIN 2.6* 2.4* 2.4*   No results for input(s): LIPASE, AMYLASE in the last 168 hours.  No results for input(s): AMMONIA in the last 168 hours. CBC: Recent Labs  Lab 01/22/22 0218 01/23/22 0236 01/24/22 0454 01/25/22 0600 01/26/22 0706  WBC 7.8 9.2 10.2 8.2 7.7  NEUTROABS 5.6 6.9 7.6 5.6 5.2  HGB 8.5* 8.3* 8.3* 8.1* 8.2*  HCT 25.0* 25.8* 26.5* 24.6* 25.9*  MCV 94.7 94.9 98.1 95.7 97.4  PLT 193 187 191 163 170   Cardiac Enzymes: No results for input(s): CKTOTAL, CKMB, CKMBINDEX, TROPONINI in the last 168 hours. CBG: Recent Labs  Lab 01/25/22 0737 01/25/22 1310 01/25/22 2143 01/26/22 0447 01/26/22 0734  GLUCAP 114* 102* 122* 111* 121*    Iron Studies:  No results for input(s): IRON, TIBC, TRANSFERRIN, FERRITIN in the last 72 hours.  Studies/Results:  No results found. Medications: Infusions:  sodium chloride      Scheduled Medications:  aspirin EC  81 mg Oral Daily   atorvastatin  80 mg Oral Daily   calcitRIOL  0.25 mcg Oral Daily   calcium acetate  667 mg Oral TID WC   Chlorhexidine Gluconate Cloth  6 each Topical Daily   Chlorhexidine Gluconate Cloth  6 each Topical Q0600   clopidogrel  75 mg Oral Q breakfast   [START ON 01/27/2022] darbepoetin (ARANESP) injection - DIALYSIS  100 mcg Intravenous Q Wed-HD   hydrALAZINE  75 mg Oral Q8H   insulin aspart  0-6 Units Subcutaneous Q4H   metoCLOPramide (REGLAN) injection  5 mg Intravenous TID WC & HS   metoprolol tartrate  25 mg Oral Q6H   pantoprazole  40 mg Oral Daily   sodium chloride flush  3 mL Intravenous Q12H   sodium chloride flush   3 mL Intravenous Q12H    have reviewed scheduled and prn medications.  Physical Exam: General: thin, lying in bed, no distress Heart: tachy, no audible murmur Lungs: Bilateral chest rise with no increased work of breathing Abdomen: soft, non tender Extremities: actually does have some pitting edema  neuro: Awake, oriented to person and place but not situation    01/26/2022,10:00 AM  LOS: 11 days

## 2022-01-26 NOTE — Consult Note (Signed)
[facility] Inpatient Consult   01/26/2022  RONDELL FRICK 09-18-1935 [id number]  [place] Management [facility] CM)   Patient chart screened for [facility] CM services with noted high risk score for unplanned readmission. Patient had been active with [name] CM health coach prior to hospital admission.   Spoke with patient at bedside. HIPAA verified. Reviewed [facility] CM services. Patient states that she has 3 daughters who help her with care needs. States that she would like post hospital follow up for chronic disease management as she will have outpatient dialysis.  Plan: Will refer to [place] RN case manager for post hospital chronic disease and care coordination services.  Of note, [facility] Care Management services does not replace or interfere with any services that are arranged by inpatient case management or social work.   Netta Cedars, MSN, RN [facility] [employer] [phone]  Toll free office [phone]

## 2022-01-26 NOTE — Progress Notes (Signed)
Pt has been accepted at [facility] on TTS schedule. For first appt, pt will need to arrive at 10:25 to complete paperwork prior to 11:25 chair time (unless pt starts on a Saturday). Update provided to nephrologist. Message left for pt's dtr, Jody, to discuss appt details. Will assist as needed.   Melven Sartorius Renal Navigator [phone]

## 2022-01-26 NOTE — Consult Note (Addendum)
[facility] [place] CONSULT NOTE  VASCULAR SURGERY ASSESSMENT & PLAN:   END-STAGE RENAL DISEASE: We have been consulted for hemodialysis access.  She is right-handed.  She has a PICC line in the right arm.  Therefore we will plan access of the left arm pending her vein map and arterial duplex.  I have ordered to remove the IV from the left arm.  I have discussed the indications for the procedure and the potential complications with the patient.  Once we have a more definitive plan I will discussed this with the family.  I have scheduled her surgery for Friday.   Gae Gallop, MD 1:04 PM   MRN : [id number]  Reason for Consult: ESRD right temp cath placed by IR  Referring Physician: Dr. Osborne Casco  History of Present Illness: 87 y/o female who presented to the ED with CP and SOB.   She underwent cardiac cath without intervention.  Cardiology recommends medical management .  She has history of CKD now ESRD who has started HD this admission via temp cath placed by IR.  She has been hesitant in the past to accept permanent access.  She denies chest implants.  Today she is willing to proceed with plans.    She is know to our practice and was followed by Dr. Donnetta Hutching for surveillance of PAD and left lower extremity bypass graft with Gore-tex graft 05/16/20.   She has history of right BKA in November 2020 by Dr. Donnetta Hutching.    Past medical history includes CP CAD, HTN, DM.  She is medially managed on ASA, Plavix and statin.       Current Facility-Administered Medications  Medication Dose Route Frequency Provider Last Rate Last Admin   0.9 %  sodium chloride infusion  250 mL Intravenous PRN Troy Sine, MD       acetaminophen (TYLENOL) tablet 650 mg  650 mg Oral Q4H PRN Troy Sine, MD       aspirin EC tablet 81 mg  81 mg Oral Daily Kristopher Oppenheim, DO   81 mg at 01/26/22 7902   atorvastatin (LIPITOR) tablet 80 mg  80 mg Oral Daily Troy Sine, MD   80 mg at 01/26/22 4097    calcitRIOL (ROCALTROL) capsule 0.25 mcg  0.25 mcg Oral Daily Corliss Parish, MD   0.25 mcg at 01/26/22 0903   calcium acetate (PHOSLO) capsule 667 mg  667 mg Oral TID WC Corliss Parish, MD   667 mg at 01/26/22 0859   Chlorhexidine Gluconate Cloth 2 % PADS 6 each  6 each Topical Daily Barb Merino, MD   6 each at 01/26/22 0859   Chlorhexidine Gluconate Cloth 2 % PADS 6 each  6 each Topical Q0600 Corliss Parish, MD   6 each at 01/26/22 0550   clopidogrel (PLAVIX) tablet 75 mg  75 mg Oral Q breakfast Troy Sine, MD   75 mg at 01/26/22 0858   [START ON 01/27/2022] Darbepoetin Alfa (ARANESP) injection 100 mcg  100 mcg Intravenous Q Wed-HD Pham, Minh Q, RPH-CPP       fentaNYL (SUBLIMAZE) injection 12.5 mcg  12.5 mcg Intravenous Q2H PRN Jordan Hawks, FNP   12.5 mcg at 01/18/22 1142   hydrALAZINE (APRESOLINE) tablet 75 mg  75 mg Oral Q8H Hosie Poisson, MD   75 mg at 01/26/22 0536   insulin aspart (novoLOG) injection 0-6 Units  0-6 Units Subcutaneous Q4H Alma Friendly, MD   1 Units at 01/24/22 1510  metoCLOPramide (REGLAN) injection 5 mg  5 mg Intravenous TID WC & HS Jordan Hawks, FNP   5 mg at 01/26/22 0858   metoprolol tartrate (LOPRESSOR) injection 5 mg  5 mg Intravenous Q8H PRN Alma Friendly, MD   5 mg at 01/16/22 2057   metoprolol tartrate (LOPRESSOR) tablet 25 mg  25 mg Oral Q6H Tannenbaum, [place], MD   25 mg at 01/26/22 0536   ondansetron (ZOFRAN) injection 4 mg  4 mg Intravenous Q6H PRN Troy Sine, MD       ondansetron [facility]) tablet 4 mg  4 mg Oral Q6H PRN Kristopher Oppenheim, DO       ondansetron (ZOFRAN-ODT) disintegrating tablet 4 mg  4 mg Oral Q6H PRN Jordan Hawks, FNP   4 mg at 01/17/22 1412   pantoprazole (PROTONIX) EC tablet 40 mg  40 mg Oral Daily Hosie Poisson, MD   40 mg at 01/26/22 0903   promethazine (PHENERGAN) injection 12.5 mg  12.5 mg Intramuscular Q6H PRN Alma Friendly, MD       sodium chloride flush (NS) 0.9 % injection 3 mL  3 mL  Intravenous Q12H Bhagat, Bhavinkumar, PA   3 mL at 01/26/22 0904   sodium chloride flush (NS) 0.9 % injection 3 mL  3 mL Intravenous Q12H Troy Sine, MD   3 mL at 01/26/22 0904   sodium chloride flush (NS) 0.9 % injection 3 mL  3 mL Intravenous PRN Troy Sine, MD       traZODone (DESYREL) tablet 50 mg  50 mg Oral QHS PRN Chotiner, Yevonne Aline, MD   50 mg at 01/25/22 2053    Pt meds include: Statin :Yes Betablocker: Yes ASA: Yes Other anticoagulants/antiplatelets: none  Past Medical History:  Diagnosis Date   Acute CVA (cerebrovascular accident) ([place]) 02/18/2017   Acute ischemic stroke ([place])    Acute metabolic encephalopathy 08/28/4096   Acute onset of severe vertigo 02/17/2017   Acute upper respiratory infections of unspecified site    Amputee 08/2019   Anemia    Anemia, unspecified    Arthritis    Atherosclerosis of native arteries of the extremities, unspecified    Chest pain, unspecified    Chronic kidney disease (CKD), stage II (mild)    [facility]   Diabetes mellitus without complication ([place])    Type II   Diarrhea    Disorder of bone and cartilage, unspecified    DNR (do not resuscitate)/DNI(Do Not Intubate) 04/08/2020        Gangrene of right foot (HCC)    Herpes zoster with other nervous system complications(053.19)    History of blood transfusion    Hypercalcemia    Hypertension    Nonspecific reaction to tuberculin skin test without active tuberculosis(795.51)    Other and unspecified hyperlipidemia    PAD (peripheral artery disease) ([place])    Per [facility] from [facility]   Pain in joint, lower leg    Peripheral arterial disease ([place])    Postherpetic neuralgia    Proteinuria    Stroke ([place]) 01/2017   Type II or unspecified type diabetes mellitus with renal manifestations, uncontrolled(250.42)    Unspecified disorder of kidney and ureter     Past Surgical History:  Procedure Laterality Date   ABDOMINAL AORTOGRAM W/LOWER EXTREMITY N/A 10/31/2019    Procedure: ABDOMINAL AORTOGRAM W/LOWER EXTREMITY;  Surgeon: Wellington Hampshire, MD;  Location: [facility];  Service: Cardiovascular;  Laterality: N/A;   ABDOMINAL AORTOGRAM W/LOWER EXTREMITY Left  05/12/2020   Procedure: ABDOMINAL AORTOGRAM W/LOWER EXTREMITY;  Surgeon: Waynetta Sandy, MD;  Location: [facility];  Service: Cardiovascular;  Laterality: Left;   AMPUTATION Right 11/02/2019   Procedure: AMPUTATION BELOW KNEE RIGHT;  Surgeon: Rosetta Posner, MD;  Location: [facility];  Service: Vascular;  Laterality: Right;   COLONOSCOPY     x 2   ENDARTERECTOMY FEMORAL Left 05/21/2020   Procedure: LEFT COMMON FEMORAL ENDARTERECTOMY;  Surgeon: Rosetta Posner, MD;  Location: [facility];  Service: Vascular;  Laterality: Left;   FEMORAL-POPLITEAL BYPASS GRAFT Left 05/21/2020   Procedure: LEFT FEMORAL TO BELOW KNEE POPLITEAL ARTERY BYPASS GRAFT;  Surgeon: Rosetta Posner, MD;  Location: [facility];  Service: Vascular;  Laterality: Left;   hysterectomy     INCISION AND DRAINAGE Left 05/27/14   sebacous cyst, ear   IR FLUORO GUIDE CV LINE RIGHT  01/19/2022   IR FLUORO GUIDE CV LINE RIGHT  01/20/2022   IR FLUORO GUIDE CV LINE RIGHT  01/21/2022   IR US GUIDE VASC ACCESS RIGHT  01/19/2022   IR US GUIDE VASC ACCESS RIGHT  01/20/2022   LEFT HEART CATH AND CORONARY ANGIOGRAPHY N/A 01/20/2022   Procedure: LEFT HEART CATH AND CORONARY ANGIOGRAPHY;  Surgeon: Troy Sine, MD;  Location: [facility];  Service: Cardiovascular;  Laterality: N/A;   PRP Left    Dr. Ricki Miller   removal of cyst from hand     removal of tumor from foot     TONSILLECTOMY      Social History Social History   Tobacco Use   Smoking status: Former    Types: Cigarettes   Smokeless tobacco: Never   Tobacco comments:    Quit about age 33  " it was really a habit"  Vaping Use   Vaping Use: Never used  Substance Use Topics   Alcohol use: No    Alcohol/week: 0.0 standard drinks   Drug use: No    Family History Family  History  Problem Relation Age of Onset   Diabetes Mother    Diabetes Father    Diabetes Sister    Diabetes Sister     Allergies  Allergen Reactions   Invokana [Canagliflozin] Itching, Swelling and Other (See Comments)    Vaginal itching, swelling and irritation   Jardiance [Empagliflozin] Itching, Swelling and Other (See Comments)    Vaginal itching and swelling   Tuberculin Ppd Other (See Comments)    Per records from [facility]: [ ]  Weight loss, [ ]  Fever, [ ]  chills Neurologic: [ ]  Dizziness, [ ]  Blackouts, [ ]  Seizure [ ]  Stroke, [ ]  "Mini stroke", [ ]  Slurred speech, [ ]  Temporary blindness; [ ]  weakness in arms or legs, [ ]  Hoarseness [ ]  Dysphagia Cardiac: [ x] Chest pain/pressure, [ ]  Shortness of breath at rest [x ] Shortness of breath with exertion, [ ]  Atrial fibrillation or irregular heartbeat  Vascular: [ x] history ofPain in legs with walking, [ ]  Pain in legs at rest, [ ]  Pain in legs at night,  [ ]  Non-healing ulcer, [ ]  Blood clot in vein/DVT,   Pulmonary: [ ]  Home oxygen, [ ]  Productive cough, [ ]  Coughing up blood, [ ]  Asthma,  [ ]  Wheezing [ ]  COPD Musculoskeletal:  [ ]  Arthritis, [ ]  Low back pain, [ ]  Joint pain Hematologic: [ ]  Easy Bruising, [ ]  Anemia; [ ]  Hepatitis Gastrointestinal: [ ]   Blood in stool, [ ]  Gastroesophageal Reflux/heartburn, Urinary: [ ]  chronic Kidney disease, [ x] on HD - [ x] MWF or [ ]  TTHS, [ ]  Burning with urination, [ ]  Difficulty urinating Skin: [ ]  Rashes, [ ]  Wounds Psychological: [ ]  Anxiety, [ ]  Depression  Physical Examination Vitals:   01/25/22 2052 01/26/22 0444 01/26/22 0500 01/26/22 0800  BP: (!) 154/70 (!) 149/74  (!) 144/82  Pulse: 75 79  74  Resp: 18   14  Temp: 97.8 F (36.6 C) 98.3 F (36.8 C)  98.7 F (37.1 C)  TempSrc: Axillary Oral  Oral  SpO2: 98% 95%  98%  Weight:   60 kg   Height:       Body mass index is 22.01 kg/m.  General:  WDWN in NAD HENT: WNL Eyes:  Pupils equal Pulmonary: normal non-labored breathing , without Rales, rhonchi,  wheezing Cardiac: RRR, without  Murmurs, rubs or gallops; No carotid bruits Abdomen: soft, NT, no masses Skin: no rashes, ulcers noted;  no Gangrene , no cellulitis; no open wounds;   Vascular Exam/Pulses:palpable brachial pulses B, hand warm and well perfused without palpable radial pulses.  Skin without ischemic changes.   Musculoskeletal: no muscle wasting or atrophy; no edema  Neurologic: A&O X 3; Appropriate Affect ;  SENSATION: normal; MOTOR FUNCTION: right BKA Speech is fluent/normal   Significant Diagnostic Studies: CBC Lab Results  Component Value Date   WBC 7.7 01/26/2022   HGB 8.2 (L) 01/26/2022   HCT 25.9 (L) 01/26/2022   MCV 97.4 01/26/2022   PLT 170 01/26/2022    BMET    Component Value Date/Time   NA 133 (L) 01/26/2022 0706   NA 128 (L) 10/22/2019 1056   K 3.4 (L) 01/26/2022 0706   CL 98 01/26/2022 0706   CO2 25 01/26/2022 0706   GLUCOSE 122 (H) 01/26/2022 0706   BUN 12 01/26/2022 0706   BUN 17 10/22/2019 1056   CREATININE 3.42 (H) 01/26/2022 0706   CREATININE 3.66 (H) 04/06/2021 1216   CALCIUM 8.6 (L) 01/26/2022 0706   CALCIUM 8.7 11/30/2021 1404   GFRNONAA 13 (L) 01/26/2022 0706   GFRNONAA 11 (L) 04/06/2021 1216   GFRAA 13 (L) 04/06/2021 1216   Estimated Creatinine Clearance: 11 mL/min (A) (by C-G formula based on SCr of 3.42 mg/dL (H)).  COAG Lab Results  Component Value Date   INR 0.9 05/21/2020   INR 1.0 02/08/2020   INR 1.3 (H) 10/29/2019     Non-Invasive Vascular Imaging: Pending vein mapping  ASSESSMENT/PLAN:  Acute on CKD now requiring HD via temp HD cath placed by IR this admission.  We have been asked to place permanent access.  The patient is right hand dominant.  I have ordered vain mapping.  We will plan left UE AV fistula verse graft.  She wants MD to discuss plans with her family prior to her consenting.      Roxy Horseman 01/26/2022 10:26 AM

## 2022-01-26 NOTE — Progress Notes (Signed)
PROGRESS NOTE    Monique Cabrera  MWN:[id number] DOB: Mar 31, 1935 DOA: 01/15/2022 PCP: Wardell Honour, MD    Chief Complaint  Patient presents with   Shortness of Breath    Brief Narrative:  87 yo female with hx of diastolic CHF, CKD stage 5(followed by [facility]), type 2 DM, HTN, s/p right BKA, presented to ER with CP and worsening SOB  well over 3 months, which has been progressively worsening. She denies any lower extremity edema. In the ER, pt noted to hypertensive with SBP >200. Pt with difficult IV access. Took several hours to establish PIV. Started on IV ntg gtts. Labs showed troponin 191 --> 244. Cardiology consulted. Scr 7.5 BUN 51. Bicarb 18, BNP >4500. Due to malignant HTN, elevated troponin, CXR consistent with CHF, [facility] consulted for admission.  She underwent LHC, cardiology recommended medical management. Meanwhile she was started on HD for stage V CKD. CLIP in progress. Vascular surgery consulted for HD access, vein mapping today and plan for LEV ue AV fistula vs graft. Plan for OR on Friday.  Patient has been accepted at [facility] on TTS schedule.  Pt seen and examined today, no new complaints.   Assessment & Plan:   Principal Problem:   Malignant hypertension Active Problems:   Type 2 diabetes mellitus with renal complication (HCC)   Status post below-knee amputation of right lower extremity (HCC)   Chronic kidney disease (CKD), stage V (HCC)   Anemia of chronic renal failure   Elevated troponin   Pleural effusion, bilateral   Acute on chronic diastolic CHF (congestive heart failure) (HCC)   DNR (do not resuscitate)/DNI(Do Not Intubate)   Non-ST elevation (NSTEMI) myocardial infarction [facility])   Hypertensive crisis:  Resolved.  Currently on hydralazine 75 mg TID, metoprolol 50 mg BID, . Bp parameters continue to be sub optimal on the non HD days  NSTEMI:  Echo reviewed.  Pt denies any chest pain or sob.  Cardiology consulted, underwent  cath, plan for medical management.  Continue with aspirin and plavix, BB and statin.     Stage V CKD with Uremic symptoms Temp HD cath placed by IR removed by the patient.  Underwent new HD catheter placement, but not functioning. New one exchanged for a new 16 cm.  Nephrology on board and following.  Vascular surgery consulted for HD access, vein mapping today and plan for LEV ue AV fistula vs graft. Plan for OR on Friday.  Patient has been accepted at [facility] on TTS schedule.     Anemia of chronic disease:  Transfuse to keep hemoglobin greater than 7.  Hemoglobin stable around 8   Hyponatremia probably from CKD.  Improving.    Type 2 DM:  CBG (last 3)  Recent Labs    01/26/22 0734 01/26/22 1136 01/26/22 1600  GLUCAP 121* 120* 126*    Resume SSI.  Hemoglobin A1c is 5.7%. resume home meds.  No changes in regimen.   Mild hypokalemia:  Monitor.    Mild hyponatremia  Sodium of 133.    DVT prophylaxis: heparin.  Code Status: full code.  Family Communication: None at bedside.  Disposition:   Status is: Inpatient  Remains inpatient appropriate because: further work up with HD       Consultants:  Cardiology Nephrology.   Procedures:  cadiac cath HD.   Antimicrobials: none.    Subjective: No new complaints.   Objective: Vitals:   01/26/22 0800 01/26/22 1141 01/26/22 1400 01/26/22 1543  BP: Marland Kitchen)  144/82 (!) 159/70 (!) 169/76   Pulse: 74 76 88   Resp: 14 16    Temp: 98.7 F (37.1 C) (!) 96.8 F (36 C)    TempSrc: Oral Axillary    SpO2: 98% 100% (!) 80% 100%  Weight:      Height:        Intake/Output Summary (Last 24 hours) at 01/26/2022 1630 Last data filed at 01/25/2022 1900 Gross per 24 hour  Intake 240 ml  Output 0 ml  Net 240 ml    Filed Weights   01/25/22 0800 01/25/22 1210 01/26/22 0500  Weight: 61.1 kg (P) 59.8 kg 60 kg    Examination:  General exam: Appears calm and comfortable  Respiratory system: Clear to  auscultation. Respiratory effort normal. Cardiovascular system: S1 & S2 heard, RRR. No JVD, No pedal edema. Gastrointestinal system: Abdomen is nondistended, soft and nontender. . Normal bowel sounds heard. Central nervous system: Alert and oriented. No focal neurological deficits. Extremities: Symmetric 5 x 5 power. Skin: No rashes, lesions or ulcers Psychiatry: Judgement and insight appear normal. Mood & affect appropriate.             Data Reviewed: I have personally reviewed following labs and imaging studies  CBC: Recent Labs  Lab 01/22/22 0218 01/23/22 0236 01/24/22 0454 01/25/22 0600 01/26/22 0706  WBC 7.8 9.2 10.2 8.2 7.7  NEUTROABS 5.6 6.9 7.6 5.6 5.2  HGB 8.5* 8.3* 8.3* 8.1* 8.2*  HCT 25.0* 25.8* 26.5* 24.6* 25.9*  MCV 94.7 94.9 98.1 95.7 97.4  PLT 193 187 191 163 170     Basic Metabolic Panel: Recent Labs  Lab 01/22/22 0218 01/23/22 0236 01/24/22 0141 01/25/22 0559 01/26/22 0706  NA 130* 132* 133* 133* 133*  K 4.0 4.3 3.5 3.4* 3.4*  CL 99 101 98 97* 98  CO2 17* 16* 23 24 25   GLUCOSE 108* 100* 129* 120* 122*  BUN 81* 45* 22 26* 12  CREATININE 8.82* 6.10* 4.08* 5.01* 3.42*  CALCIUM 8.8* 8.7* 8.5* 8.5* 8.6*  PHOS 7.4* 5.3* 3.6 3.9 2.7     GFR: Estimated Creatinine Clearance: 11 mL/min (A) (by C-G formula based on SCr of 3.42 mg/dL (H)).  Liver Function Tests: Recent Labs  Lab 01/22/22 0218 01/23/22 0236 01/24/22 0141 01/25/22 0559 01/26/22 0706  ALBUMIN 2.8* 2.7* 2.6* 2.4* 2.4*     CBG: Recent Labs  Lab 01/25/22 2143 01/26/22 0447 01/26/22 0734 01/26/22 1136 01/26/22 1600  GLUCAP 122* 111* 121* 120* 126*      No results found for this or any previous visit (from the past 240 hour(s)).         Radiology Studies: No results found.      Scheduled Meds:  aspirin EC  81 mg Oral Daily   atorvastatin  80 mg Oral Daily   calcitRIOL  0.25 mcg Oral Daily   calcium acetate  667 mg Oral TID WC   Chlorhexidine  Gluconate Cloth  6 each Topical Daily   Chlorhexidine Gluconate Cloth  6 each Topical Q0600   clopidogrel  75 mg Oral Q breakfast   [START ON 01/27/2022] darbepoetin (ARANESP) injection - DIALYSIS  100 mcg Intravenous Q Wed-HD   hydrALAZINE  75 mg Oral Q8H   insulin aspart  0-6 Units Subcutaneous TID WC   metoCLOPramide (REGLAN) injection  5 mg Intravenous TID WC & HS   metoprolol tartrate  25 mg Oral Q6H   pantoprazole  40 mg Oral Daily   sodium chloride flush  3  mL Intravenous Q12H   sodium chloride flush  3 mL Intravenous Q12H   Continuous Infusions:  sodium chloride       LOS: 11 days        Hosie Poisson, MD [facility]   To contact the attending provider between 7A-7P or the covering provider during after hours 7P-7A, please log into the web site [email or web address] and access using universal [place] password for that web site. If you do not have the password, please call the hospital operator.  01/26/2022, 4:30 PM

## 2022-01-26 NOTE — Plan of Care (Signed)
°  Problem: Health Behavior/Discharge Planning: Goal: Ability to manage health-related needs will improve Outcome: Progressing   Problem: Clinical Measurements: Goal: Ability to maintain clinical measurements within normal limits will improve Outcome: Progressing   Problem: Clinical Measurements: Goal: Respiratory complications will improve Outcome: Progressing   Problem: Clinical Measurements: Goal: Cardiovascular complication will be avoided Outcome: Progressing   Problem: Skin Integrity: Goal: Risk for impaired skin integrity will decrease Outcome: Progressing   Problem: Cardiac: Goal: Ability to achieve and maintain adequate cardiopulmonary perfusion will improve Outcome: Progressing

## 2022-01-27 ENCOUNTER — Inpatient Hospital Stay (HOSPITAL_COMMUNITY): Payer: Medicare Other

## 2022-01-27 DIAGNOSIS — N186 End stage renal disease: Secondary | ICD-10-CM | POA: Diagnosis not present

## 2022-01-27 DIAGNOSIS — Z01810 Encounter for preprocedural cardiovascular examination: Secondary | ICD-10-CM | POA: Diagnosis not present

## 2022-01-27 DIAGNOSIS — I120 Hypertensive chronic kidney disease with stage 5 chronic kidney disease or end stage renal disease: Secondary | ICD-10-CM | POA: Diagnosis not present

## 2022-01-27 DIAGNOSIS — R5381 Other malaise: Secondary | ICD-10-CM | POA: Diagnosis present

## 2022-01-27 DIAGNOSIS — E440 Moderate protein-calorie malnutrition: Secondary | ICD-10-CM | POA: Diagnosis present

## 2022-01-27 DIAGNOSIS — Z992 Dependence on renal dialysis: Secondary | ICD-10-CM | POA: Diagnosis not present

## 2022-01-27 LAB — CBC WITH DIFFERENTIAL/PLATELET
Abs Immature Granulocytes: 0.12 10*3/uL — ABNORMAL HIGH (ref 0.00–0.07)
Basophils Absolute: 0 10*3/uL (ref 0.0–0.1)
Basophils Relative: 0 %
Eosinophils Absolute: 0.1 10*3/uL (ref 0.0–0.5)
Eosinophils Relative: 1 %
HCT: 25.5 % — ABNORMAL LOW (ref 36.0–46.0)
Hemoglobin: 8.1 g/dL — ABNORMAL LOW (ref 12.0–15.0)
Immature Granulocytes: 2 %
Lymphocytes Relative: 12 %
Lymphs Abs: 0.9 10*3/uL (ref 0.7–4.0)
MCH: 30.9 pg (ref 26.0–34.0)
MCHC: 31.8 g/dL (ref 30.0–36.0)
MCV: 97.3 fL (ref 80.0–100.0)
Monocytes Absolute: 1.5 10*3/uL — ABNORMAL HIGH (ref 0.1–1.0)
Monocytes Relative: 20 %
Neutro Abs: 5 10*3/uL (ref 1.7–7.7)
Neutrophils Relative %: 65 %
Platelets: 163 10*3/uL (ref 150–400)
RBC: 2.62 MIL/uL — ABNORMAL LOW (ref 3.87–5.11)
RDW: 14.6 % (ref 11.5–15.5)
WBC: 7.7 10*3/uL (ref 4.0–10.5)
nRBC: 0 % (ref 0.0–0.2)

## 2022-01-27 LAB — RENAL FUNCTION PANEL
Albumin: 2.5 g/dL — ABNORMAL LOW (ref 3.5–5.0)
Anion gap: 12 (ref 5–15)
BUN: 17 mg/dL (ref 8–23)
CO2: 24 mmol/L (ref 22–32)
Calcium: 9 mg/dL (ref 8.9–10.3)
Chloride: 99 mmol/L (ref 98–111)
Creatinine, Ser: 4.11 mg/dL — ABNORMAL HIGH (ref 0.44–1.00)
GFR, Estimated: 10 mL/min — ABNORMAL LOW (ref >60)
Glucose, Bld: 100 mg/dL — ABNORMAL HIGH (ref 70–99)
Phosphorus: 3.2 mg/dL (ref 2.5–4.6)
Potassium: 3.6 mmol/L (ref 3.5–5.1)
Sodium: 135 mmol/L (ref 135–145)

## 2022-01-27 LAB — GLUCOSE, CAPILLARY
Glucose-Capillary: 107 mg/dL — ABNORMAL HIGH (ref 70–99)
Glucose-Capillary: 111 mg/dL — ABNORMAL HIGH (ref 70–99)
Glucose-Capillary: 113 mg/dL — ABNORMAL HIGH (ref 70–99)
Glucose-Capillary: 117 mg/dL — ABNORMAL HIGH (ref 70–99)
Glucose-Capillary: 118 mg/dL — ABNORMAL HIGH (ref 70–99)
Glucose-Capillary: 118 mg/dL — ABNORMAL HIGH (ref 70–99)

## 2022-01-27 MED ORDER — GLUCERNA SHAKE PO LIQD
237.0000 mL | Freq: Two times a day (BID) | ORAL | Status: DC
  Administered 2022-01-27 – 2022-01-29 (×3): 237 mL via ORAL

## 2022-01-27 MED ORDER — ASCORBIC ACID 500 MG PO TABS
250.0000 mg | ORAL_TABLET | Freq: Every day | ORAL | Status: DC
  Administered 2022-01-27 – 2022-01-29 (×3): 250 mg via ORAL
  Filled 2022-01-27 (×3): qty 1

## 2022-01-27 MED ORDER — DARBEPOETIN ALFA 150 MCG/0.3ML IJ SOSY
150.0000 ug | PREFILLED_SYRINGE | INTRAMUSCULAR | Status: DC
  Filled 2022-01-27: qty 0.3

## 2022-01-27 MED ORDER — DARBEPOETIN ALFA 150 MCG/0.3ML IJ SOSY: 150.0000 ug | PREFILLED_SYRINGE | INTRAMUSCULAR | Status: DC

## 2022-01-27 MED ORDER — RENA-VITE PO TABS
1.0000 | ORAL_TABLET | Freq: Every day | ORAL | Status: DC
  Administered 2022-01-27 – 2022-01-28 (×2): 1 via ORAL
  Filled 2022-01-27 (×2): qty 1

## 2022-01-27 NOTE — H&P (View-Only) (Signed)
°  Progress Note    01/27/2022 7:49 AM 7 Days Post-Op  Subjective:  No complaints   Vitals:   01/27/22 0005 01/27/22 0406  BP: (!) 147/71 139/90  Pulse: 72 79  Resp: 18 19  Temp: (!) 97.5 F (36.4 C) 97.8 F (36.6 C)  SpO2: 94% 98%   Physical Exam: Lungs:  non labored Extremities:  palpable L radial pulse Neurologic: A&O  CBC    Component Value Date/Time   WBC 7.7 01/27/2022 0305   RBC 2.62 (L) 01/27/2022 0305   HGB 8.1 (L) 01/27/2022 0305   HGB 8.6 (L) 10/22/2019 1056   HCT 25.5 (L) 01/27/2022 0305   HCT 26.7 (L) 10/22/2019 1056   PLT 163 01/27/2022 0305   PLT 508 (H) 10/22/2019 1056   MCV 97.3 01/27/2022 0305   MCV 91 10/22/2019 1056   MCH 30.9 01/27/2022 0305   MCHC 31.8 01/27/2022 0305   RDW 14.6 01/27/2022 0305   RDW 12.0 10/22/2019 1056   LYMPHSABS 0.9 01/27/2022 0305   LYMPHSABS 1.6 10/22/2019 1056   MONOABS 1.5 (H) 01/27/2022 0305   EOSABS 0.1 01/27/2022 0305   EOSABS 0.1 10/22/2019 1056   BASOSABS 0.0 01/27/2022 0305   BASOSABS 0.1 10/22/2019 1056    BMET    Component Value Date/Time   NA 135 01/27/2022 0305   NA 128 (L) 10/22/2019 1056   K 3.6 01/27/2022 0305   CL 99 01/27/2022 0305   CO2 24 01/27/2022 0305   GLUCOSE 100 (H) 01/27/2022 0305   BUN 17 01/27/2022 0305   BUN 17 10/22/2019 1056   CREATININE 4.11 (H) 01/27/2022 0305   CREATININE 3.66 (H) 04/06/2021 1216   CALCIUM 9.0 01/27/2022 0305   CALCIUM 8.7 11/30/2021 1404   GFRNONAA 10 (L) 01/27/2022 0305   GFRNONAA 11 (L) 04/06/2021 1216   GFRAA 13 (L) 04/06/2021 1216    INR    Component Value Date/Time   INR 0.9 05/21/2020 0948     Intake/Output Summary (Last 24 hours) at 01/27/2022 0749 Last data filed at 01/27/2022 0000 Gross per 24 hour  Intake 120 ml  Output 350 ml  Net -230 ml     Assessment/Plan:  87 y.o. female with ESRD on HD via temp cath R IJ 7 Days Post-Op   Plan is for L arm AVF vs AVG and R IJ TDC placement Friday with Dr. Scot Dock Vein mapping and L arm  arterial duplex pending All questions were answered and she is agreeable to proceed; the patient's daughter is visiting later this morning and we will be available if she has any questions Consent ordered   Dagoberto Ligas, PA-C [facility] [phone] 01/27/2022 7:49 AM

## 2022-01-27 NOTE — Progress Notes (Signed)
Subjective: Patient feels well today with no complaints  Objective Vital signs in last 24 hours: Vitals:   01/26/22 2107 01/27/22 0005 01/27/22 0406 01/27/22 0423  BP: (!) 147/64 (!) 147/71 139/90   Pulse: 86 72 79   Resp: 19 18 19    Temp: 97.8 F (36.6 C) (!) 97.5 F (36.4 C) 97.8 F (36.6 C)   TempSrc: Oral Oral Oral   SpO2: 97% 94% 98%   Weight:    60.5 kg  Height:       Weight change: -0.6 kg  Intake/Output Summary (Last 24 hours) at 01/27/2022 1231 Last data filed at 01/27/2022 0000 Gross per 24 hour  Intake 120 ml  Output 350 ml  Net -230 ml    Assessment/ Plan: Pt is a 87 y.o. yo female with known advanced CKD who was admitted on 01/15/2022 with acute coronary event  Assessment/Plan: 1. Acute coronary event-  cath done-  showed blockage but collaterals-  nothing to intervene upon-  medical management -  seems more stable in that regard  2. Renal-  apparently had been asked several times as OP if she wanted dialysis and she always declined even when faced with decision that she could die without HD -  documented in the [facility] record.  Palliative care meeting took place on 1/23 and decision was made to go ahead and pursue HD wanting to do what we could to prolong life.    -Holding dialysis today and plan to switch to TTS schedule; next dialysis tomorrow -Has been accepted for [facility] schedule; she will be stable to start outpatient after access creation -VVS consulted; plan for AVF/G on Friday; appreciate help   3. Anemia-hemoglobin stable around 8.  Iron stores replete.  ESA on board. Increase to 131mcg weekly starting tomorrow  4. HTN/volume-blood pressure has improved with medications and optimization of fluid status  5. Bones- started phoslo and vitamin D based on labs     Reesa Chew    Labs: Basic Metabolic Panel: Recent Labs  Lab 01/25/22 0559 01/26/22 0706 01/27/22 0305  NA 133* 133* 135  K 3.4* 3.4* 3.6  CL 97* 98 99  CO2 24 25 24    GLUCOSE 120* 122* 100*  BUN 26* 12 17  CREATININE 5.01* 3.42* 4.11*  CALCIUM 8.5* 8.6* 9.0  PHOS 3.9 2.7 3.2   Liver Function Tests: Recent Labs  Lab 01/25/22 0559 01/26/22 0706 01/27/22 0305  ALBUMIN 2.4* 2.4* 2.5*   No results for input(s): LIPASE, AMYLASE in the last 168 hours.  No results for input(s): AMMONIA in the last 168 hours. CBC: Recent Labs  Lab 01/23/22 0236 01/24/22 0454 01/25/22 0600 01/26/22 0706 01/27/22 0305  WBC 9.2 10.2 8.2 7.7 7.7  NEUTROABS 6.9 7.6 5.6 5.2 5.0  HGB 8.3* 8.3* 8.1* 8.2* 8.1*  HCT 25.8* 26.5* 24.6* 25.9* 25.5*  MCV 94.9 98.1 95.7 97.4 97.3  PLT 187 191 163 170 163   Cardiac Enzymes: No results for input(s): CKTOTAL, CKMB, CKMBINDEX, TROPONINI in the last 168 hours. CBG: Recent Labs  Lab 01/26/22 2106 01/27/22 0003 01/27/22 0402 01/27/22 0734 01/27/22 1149  GLUCAP 114* 113* 111* 117* 107*    Iron Studies:  No results for input(s): IRON, TIBC, TRANSFERRIN, FERRITIN in the last 72 hours.  Studies/Results: No results found. Medications: Infusions:  sodium chloride      Scheduled Medications:  vitamin C  250 mg Oral Daily   aspirin EC  81 mg Oral Daily   atorvastatin  80 mg Oral Daily   calcitRIOL  0.25 mcg Oral Daily   calcium acetate  667 mg Oral TID WC   Chlorhexidine Gluconate Cloth  6 each Topical Q0600   clopidogrel  75 mg Oral Q breakfast   darbepoetin (ARANESP) injection - DIALYSIS  100 mcg Intravenous Q Wed-HD   feeding supplement (GLUCERNA SHAKE)  237 mL Oral BID BM   hydrALAZINE  75 mg Oral Q8H   insulin aspart  0-6 Units Subcutaneous TID WC   metoCLOPramide (REGLAN) injection  5 mg Intravenous TID WC & HS   metoprolol tartrate  50 mg Oral BID   multivitamin  1 tablet Oral QHS   pantoprazole  40 mg Oral Daily   sodium chloride flush  3 mL Intravenous Q12H   sodium chloride flush  3 mL Intravenous Q12H    have reviewed scheduled and prn medications.  Physical Exam: General: thin, lying in bed, no  distress Heart: tachy, no audible murmur Lungs: Bilateral chest rise with no increased work of breathing Abdomen: soft, non tender Extremities: actually does have some pitting edema  neuro: Awake, oriented to person and place but not situation    01/27/2022,12:31 PM  LOS: 12 days

## 2022-01-27 NOTE — Progress Notes (Signed)
Met with pt and pt's dtr, Jody, at bedside today to discuss out-pt HD arrangements. Schedule letter provided to pt and pt's dtr. Pt accepted at [facility] on TTS. Pt will need to arrive at 10:25 for first appt for 11:25 chair time. Discussed with pt and pt's dtr that if pt is d/c on Friday after procedure that pt will need to start at out-pt clinic on Saturday (if clinic agreeable to Saturday start date). Also advised pt and pt's dtr that if pt is d/c Saturday am and clinic agreeable, pt may need to receive HD as out-pt. Will add HD arrangements to AVS. Will assist as needed.   Melven Sartorius Renal Navigator [phone]

## 2022-01-27 NOTE — Progress Notes (Signed)
Initial Nutrition Assessment  DOCUMENTATION CODES:  Non-severe (moderate) malnutrition in context of chronic illness  INTERVENTION:  Recommend liberalizing diet to regular for poor PO intake and advanced age. Glucerna Shake po BID, each supplement provides 220 kcal and 10 grams of protein Renavite daily  NUTRITION DIAGNOSIS:  Moderate Malnutrition (in the context of chronic illness) related to decreased appetite as evidenced by mild fat depletion, moderate muscle depletion.  GOAL:  Patient will meet greater than or equal to 90% of their needs  MONITOR:  PO intake  REASON FOR ASSESSMENT:  LOS    ASSESSMENT:  87 y.o. female with hx of CHF, CKD5, type 2 DM, HTN, HLD, hx of CVA, PAD, s/p right BKA, presents to ER with chest pain and SOB.  Workup consistent with a hypertensive emergency an concern for fluid overload. Imaging showed bilateral pleural effusions. Pt was started on HD this admission with plan for perm cath placement 2/2.  1/25 - temporary HD cath placed, HD initiated 1/26 - temporary cath exchanged  Pt resting in bed at the time of assessment. Breakfast tray at bedside poorly consumed. Pt reports that she hasn't had an appetite for about 3 months. Has only been consuming 2 meals a day and will sometimes have a glucerna 1x/d at home. Pt reports that pta, she was independent of ADLs and ambulated well with her prosthetic leg.   Discussed pt's elevated needs due to HD and the importance of adequate nutrition in preventing loss of lean body mass. Pt agreeable to glucerna (prefers strawberry) and also agreeable to try and increase her intake. Will liberalize diet as pt's appetite is poor and her advanced age, discussed with MD.  Pt did request to get out of bed and move to bedside chair, communicated request to RN and NT.    Average Meal Intake: 1/20-2/1: 42% average intake x 8 recorded meals (0-100%)  Nutritionally Relevant Medications: Scheduled Meds:  atorvastatin  80  mg Oral Daily   calcitRIOL  0.25 mcg Oral Daily   calcium acetate  667 mg Oral TID WC   insulin aspart  0-6 Units Subcutaneous TID WC   metoCLOPramide injection  5 mg Intravenous TID WC & HS   pantoprazole  40 mg Oral Daily   PRN Meds: ondansetron, promethazine  Labs Reviewed: Creatinine 4.11   NUTRITION - FOCUSED PHYSICAL EXAM: Flowsheet Row Most Recent Value  Orbital Region Mild depletion  Upper Arm Region No depletion  Thoracic and Lumbar Region No depletion  Buccal Region Mild depletion  Temple Region Moderate depletion  Clavicle Bone Region Moderate depletion  Clavicle and Acromion Bone Region Mild depletion  Scapular Bone Region Mild depletion  Dorsal Hand No depletion  Patellar Region Moderate depletion  Anterior Thigh Region Moderate depletion  Posterior Calf Region Mild depletion  Edema (RD Assessment) None  Hair Reviewed  Eyes Reviewed  Mouth Reviewed  Skin Reviewed  Nails Reviewed   Diet Order:   Diet Order             Diet regular Room service appropriate? Yes; Fluid consistency: Thin  Diet effective now                   EDUCATION NEEDS:  Education needs have been addressed  Skin:  Skin Assessment: Reviewed RN Assessment  Last BM:  1/26  Height:  Ht Readings from Last 1 Encounters:  01/15/22 5\' 5"  (1.651 m)    Weight:  Wt Readings from Last 1 Encounters:  01/27/22 60.5 kg  Ideal Body Weight:  53.5 kg (adjusted by 5.9% for BKA)  BMI:  Adjusted Body mass index is 23.6 kg/m. (Adjusted for BKA using 60.5 kg)  Estimated Nutritional Needs:  Kcal:  1600-1800 kcal/d Protein:  80-90 g/d Fluid:  1L + UOP   Ranell Patrick, RD, LDN Clinical Dietitian RD pager # available in [facility]  After hours/weekend pager # available in [facility]

## 2022-01-27 NOTE — Plan of Care (Signed)
°  Problem: Clinical Measurements: Goal: Ability to maintain clinical measurements within normal limits will improve Outcome: Progressing   Problem: Clinical Measurements: Goal: Diagnostic test results will improve Outcome: Progressing   Problem: Clinical Measurements: Goal: Respiratory complications will improve Outcome: Progressing   Problem: Clinical Measurements: Goal: Cardiovascular complication will be avoided Outcome: Progressing   Problem: Activity: Goal: Risk for activity intolerance will decrease Outcome: Progressing   Problem: Coping: Goal: Level of anxiety will decrease Outcome: Progressing   Problem: Pain Managment: Goal: General experience of comfort will improve Outcome: Progressing   Problem: Safety: Goal: Ability to remain free from injury will improve Outcome: Progressing   Problem: Skin Integrity: Goal: Risk for impaired skin integrity will decrease Outcome: Progressing

## 2022-01-27 NOTE — Progress Notes (Signed)
Progress Note   Monique Cabrera: Monique Cabrera CBJ:[id number] DOB: May 14, 1935 DOA: 01/15/2022     12 DOS: the Monique Cabrera was seen and examined on 01/27/2022   Brief hospital course: Monique Cabrera is a 87 year old female with past medical history significant for chronic diastolic congestive heart failure, CKD stage V, type 2 diabetes mellitus, essential hypertension, s/p right BKA who presented to [facility] ED on 1/20 with progressive shortness of breath and chest pain over the past 3 months.  Denies nausea/vomiting.  In the ED, Monique Cabrera was noted to be hypertensive with SBP greater than 200.  Sodium 137, potassium 4.2, chloride 104, CO2 18, glucose 115, BUN 51, creatinine 7.50, AST 23, ALT 17, total bilirubin is at 1.0.  WBC 8.7, hemoglobin 11.0, platelets 229.  BNP greater than 4500.  High sensitive troponin 191>244.  Chest x-ray with cardiomegaly, mild interstitial edema, increase in moderate bilateral pleural effusions.  Cardiology consulted.  TRH consulted for further evaluation management of malignant hypertension, elevated troponin, CHF exacerbation and progressive renal failure.   Assessment and Plan: * Malignant hypertension- (present on admission) Upon presentation to the ED, SBP's noted to be in the 200s.  Likely secondary to worsening renal failure, volume overload. --Metoprolol tartrate 50 mg p.o. BID --Hydralazine 75 mg PO TID --Continue monitor BP and adjust antihypertensives as needed  Acute on chronic diastolic CHF (congestive heart failure) ([place])- (present on admission) Monique Cabrera presenting with progressive shortness of breath, noted elevated BNP greater than 4500 with chest x-ray findings of vascular congestion and worsening pleural effusions.  Likely secondary to worsening renal failure; now started on dialysis. --Continue volume management per HD  Chronic kidney disease (CKD), stage V ([place])- (present on admission) Monique Cabrera with Hx of CKD stage 5, follows with [facility].  Per last  office notes in December 2022, Monique Cabrera declined renal replacement therapy.  She wanted to avoid hospitalizations.  Monique Cabrera with creatinine elevated 7.50 with elevated BUN; now agreeing for hemodialysis. --Nephrology following, appreciate assistance --Continue HD on TTS schedule; outpatient seat obtained [facility] --pending AV fistula vs graft placement and tunneled TDC on 2/3 by vascular surgery   Type 2 diabetes mellitus with renal complication ([place])- (present on admission) Hemoglobin A1c 5.7, well controlled.  Home medications include Lantus 20 units [place] Cabrera, Humalog 3 units TIDAC --SSI for coverage --CBGs qAC/HS  Non-ST elevation (NSTEMI) myocardial infarction [facility])- (present on admission) High sensitive troponin elevated 191 on admission and peaked at 6368.  TTE with LVEF 40-45%, LAD territory hypokinesis.  Cardiology was consulted and Monique Cabrera underwent left heart catheterization 1/25 with proximal LAD-mid LAD lesion 50% stenosed, first marginal 80 sent stenosed, RPDA 100% stenosed, ostial RCA 100% stenosed.  Cardiology recommended GMD T with medical management --Aspirin --Plavix --Metoprolol tartrate 50 mg p.o. twice Cabrera --Atorvastatin p.o. Cabrera  Elevated troponin Cardiology has been consulted to manage pt's elevated troponin. Check echo in AM. EKG without acute ST-T changes.   Anemia of chronic renal failure- (present on admission) Hemoglobin 8.1, stable -- Transfuse for hemoglobin less than 7.0 or active bleeding.  Malnutrition of moderate degree- (present on admission) Body mass index is 22.2 kg/m.  Nutrition Status: Nutrition Problem: Moderate Malnutrition (in the context of chronic illness) Etiology: decreased appetite Signs/Symptoms: mild fat depletion, moderate muscle depletion Interventions: Refer to RD note for recommendations -- Dietitian following, encourage increase oral intake, supplementation    Status post below-knee amputation of right lower  extremity ([place])- (present on admission) Chronic.  Hyperlipidemia- (present on admission) --Atorvastatin 80  mg p.o. Cabrera  Physical deconditioning- (present on admission) PT/OT currently recommending home health. --Continue therapy efforts while inpatient  DNR (do not resuscitate)/DNI(Do Not Intubate)- (present on admission) Has a DNR form that was signed in April 2021.     Pleural effusion, bilateral On RA. If SOB becomes an issues, may need bilateral thoracenteses.        Subjective:  Monique Cabrera seen examined at bedside, resting comfortably.  No family present.  Awaiting AV fistula versus graft placement with temporary dialysis catheter planned for Friday.  Denies headache, no fever/chills/night sweats, no nausea/vomiting/diarrhea, no chest pain, no palpitations, no shortness of breath, no abdominal pain, no weakness, no fatigue, no paresthesias.  No acute events overnight per nursing staff.  Physical Exam: Vitals:   01/27/22 0406 01/27/22 0423 01/27/22 0815 01/27/22 1515  BP: 139/90  (!) 142/86 (!) 167/68  Pulse: 79  79   Resp: 19     Temp: 97.8 F (36.6 C)  98.2 F (36.8 C)   TempSrc: Oral  Oral   SpO2: 98%  98%   Weight:  60.5 kg    Height:       Physical Exam GEN: 87 yo female in NAD, alert and oriented x 3, chronically ill in appearance HEENT: NCAT, PERRL, EOMI, sclera clear, MMM PULM: CTAB w/o wheezes/crackles, normal respiratory effort, on room air CV: RRR w/o M/G/R no JVD, temporary right IJ HD catheter noted GI: abd soft, NTND, NABS, no R/G/M MSK: no peripheral edema, muscle strength globally intact 5/5 bilateral upper/lower extremities NEURO: CN II-XII intact, no focal deficits, sensation to light touch intact PSYCH: normal mood/affect Integumentary: dry/intact, no rashes or wounds   Data Reviewed:  Reviewed CBC and BMP this morning.  Family Communication: No family present at bedside this morning  Disposition: Status is: Inpatient Remains  inpatient appropriate because: Pending AV fistula versus graft placement with tunneled temporary dialysis catheter planned for Friday, hopeful for discharge thereafter for continued HD outpatient.  Plan home health on discharge.          Planned Discharge Destination: Home with Home Health     Time spent: 49 minutes spent on chart review, discussion with nursing staff, consultants, updating family and interview/physical exam; more than 50% of that time was spent in counseling and/or coordination of care.  Author: Lida Berkery J [place] DO 01/27/2022 6:02 PM  For on call review [email or web address].

## 2022-01-27 NOTE — Progress Notes (Signed)
BUE pre AVF arterial duplex has been completed.  Attempted BUE vein mapping but patient refusing at this time.  Patient states she is "feeling nervous and can't sit still."  Will re-attempt exam as schedule permits.   Results can be found under chart review under CV PROC. 01/27/2022 1:16 PM Darby Shadwick RVT, RDMS

## 2022-01-27 NOTE — Assessment & Plan Note (Addendum)
Atorvastatin 80 mg p.o. daily ?

## 2022-01-27 NOTE — Assessment & Plan Note (Signed)
PT/OT currently recommending home health. --Continue therapy efforts while inpatient

## 2022-01-27 NOTE — Progress Notes (Addendum)
°  Progress Note    01/27/2022 7:49 AM 7 Days Post-Op  Subjective:  No complaints   Vitals:   01/27/22 0005 01/27/22 0406  BP: (!) 147/71 139/90  Pulse: 72 79  Resp: 18 19  Temp: (!) 97.5 F (36.4 C) 97.8 F (36.6 C)  SpO2: 94% 98%   Physical Exam: Lungs:  non labored Extremities:  palpable L radial pulse Neurologic: A&O  CBC    Component Value Date/Time   WBC 7.7 01/27/2022 0305   RBC 2.62 (L) 01/27/2022 0305   HGB 8.1 (L) 01/27/2022 0305   HGB 8.6 (L) 10/22/2019 1056   HCT 25.5 (L) 01/27/2022 0305   HCT 26.7 (L) 10/22/2019 1056   PLT 163 01/27/2022 0305   PLT 508 (H) 10/22/2019 1056   MCV 97.3 01/27/2022 0305   MCV 91 10/22/2019 1056   MCH 30.9 01/27/2022 0305   MCHC 31.8 01/27/2022 0305   RDW 14.6 01/27/2022 0305   RDW 12.0 10/22/2019 1056   LYMPHSABS 0.9 01/27/2022 0305   LYMPHSABS 1.6 10/22/2019 1056   MONOABS 1.5 (H) 01/27/2022 0305   EOSABS 0.1 01/27/2022 0305   EOSABS 0.1 10/22/2019 1056   BASOSABS 0.0 01/27/2022 0305   BASOSABS 0.1 10/22/2019 1056    BMET    Component Value Date/Time   NA 135 01/27/2022 0305   NA 128 (L) 10/22/2019 1056   K 3.6 01/27/2022 0305   CL 99 01/27/2022 0305   CO2 24 01/27/2022 0305   GLUCOSE 100 (H) 01/27/2022 0305   BUN 17 01/27/2022 0305   BUN 17 10/22/2019 1056   CREATININE 4.11 (H) 01/27/2022 0305   CREATININE 3.66 (H) 04/06/2021 1216   CALCIUM 9.0 01/27/2022 0305   CALCIUM 8.7 11/30/2021 1404   GFRNONAA 10 (L) 01/27/2022 0305   GFRNONAA 11 (L) 04/06/2021 1216   GFRAA 13 (L) 04/06/2021 1216    INR    Component Value Date/Time   INR 0.9 05/21/2020 0948     Intake/Output Summary (Last 24 hours) at 01/27/2022 0749 Last data filed at 01/27/2022 0000 Gross per 24 hour  Intake 120 ml  Output 350 ml  Net -230 ml     Assessment/Plan:  87 y.o. female with ESRD on HD via temp cath R IJ 7 Days Post-Op   Plan is for L arm AVF vs AVG and R IJ TDC placement Friday with Dr. Scot Dock Vein mapping and L arm  arterial duplex pending All questions were answered and she is agreeable to proceed; the patient's daughter is visiting later this morning and we will be available if she has any questions Consent ordered   Dagoberto Ligas, PA-C [facility] [phone] 01/27/2022 7:49 AM

## 2022-01-27 NOTE — Progress Notes (Signed)
Physical Therapy Treatment Patient Details Name: Monique Cabrera MRN: [id number] DOB: April 15, 1935 Today's Date: 01/27/2022   History of Present Illness 87 y/o female admitted 1/20 with HTN urgency and NSTEMI. Pt is s/p heart cath via Rt groin on 1/25. Pt has begun HD since admission with 3 temp HD caths placed with last in IR on 1/26. PMHx:CKD, R BKA, dCHF, HTN, DM, diabetic retinopathy, PAD, hep C.    PT Comments    Pt confused why PT is seeing her in acute setting, states "I get around by myself at home". PT explained hospital-related deconditioning and importance of daily mobility to regain and maintain activity tolerance and strength. Pt overall requiring min assist for transfers and short-distance gait today, limited by dizziness, "weakheadedness, and period of blurred vision after 40 ft gait. BP 167/68 at this time, 146/90 upon return to room with lessened symptoms. RN aware. PT to continue to follow.    Recommendations for follow up therapy are one component of a multi-disciplinary discharge planning process, led by the attending physician.  Recommendations may be updated based on patient status, additional functional criteria and insurance authorization.  Follow Up Recommendations  Home health PT     Assistance Recommended at Discharge Frequent or constant Supervision/Assistance  Patient can return home with the following A little help with walking and/or transfers;A little help with bathing/dressing/bathroom;Help with stairs or ramp for entrance;Assist for transportation;Assistance with cooking/housework   Equipment Recommendations  None recommended by PT    Recommendations for Other Services       Precautions / Restrictions Precautions Precautions: Fall Precaution Comments: R BKA at baseline, has prosthetic Required Braces or Orthoses: Other Brace Other Brace: RLE prosthetic Restrictions Weight Bearing Restrictions: No     Mobility  Bed Mobility Overal bed mobility:  Needs Assistance Bed Mobility: Sit to Supine       Sit to supine: Supervision, HOB elevated   General bed mobility comments: increased time, HOB elevated    Transfers Overall transfer level: Needs assistance Equipment used: Rolling walker (2 wheels) Transfers: Sit to/from Stand Sit to Stand: Min assist           General transfer comment: assist for power up, rise, and steadying. STS x2, from recliner and chair in hallway.    Ambulation/Gait Ambulation/Gait assistance: Min guard Gait Distance (Feet): 40 Feet (x2, seated rest break) Assistive device: Rolling walker (2 wheels) Gait Pattern/deviations: Step-through pattern, Decreased stride length, Trunk flexed Gait velocity: decr     General Gait Details: cues for increasing foot clearance and placement in RW, pt with period of blurry vision and dizziness, BP 167/68 when assessed and felt improved after seated rest but continued to have min dizziness.   Stairs             Wheelchair Mobility    Modified Rankin (Stroke Patients Only)       Balance Overall balance assessment: Needs assistance Sitting-balance support: No upper extremity supported Sitting balance-Leahy Scale: Good     Standing balance support: Bilateral upper extremity supported, Single extremity supported, During functional activity Standing balance-Leahy Scale: Poor                              Cognition Arousal/Alertness: Awake/alert Behavior During Therapy: WFL for tasks assessed/performed Overall Cognitive Status: Within Functional Limits for tasks assessed  General Comments: pt lacks some insight into deficits, states "I don't know why I need physical therapy, I move around just fine at home" but has been here for x12 days and is not at mobility baseline.        Exercises      General Comments        Pertinent Vitals/Pain Pain Assessment Pain Assessment: No/denies  pain    Home Living                          Prior Function            PT Goals (current goals can now be found in the care plan section) Acute Rehab PT Goals Patient Stated Goal: get stronger, walk Time For Goal Achievement: 02/04/22 Potential to Achieve Goals: Good Progress towards PT goals: Progressing toward goals    Frequency    Min 3X/week      PT Plan Current plan remains appropriate    Co-evaluation              AM-PAC PT "6 Clicks" Mobility   Outcome Measure  Help needed turning from your back to your side while in a flat bed without using bedrails?: None Help needed moving from lying on your back to sitting on the side of a flat bed without using bedrails?: A Little Help needed moving to and from a bed to a chair (including a wheelchair)?: A Little Help needed standing up from a chair using your arms (e.g., wheelchair or bedside chair)?: A Little Help needed to walk in hospital room?: A Little Help needed climbing 3-5 steps with a railing? : A Lot 6 Click Score: 18    End of Session   Activity Tolerance: Patient limited by fatigue Patient left: in bed;with call bell/phone within reach;with bed alarm set Nurse Communication: Mobility status PT Visit Diagnosis: Unsteadiness on feet (R26.81);Muscle weakness (generalized) (M62.81)     Time: 1610-9604 PT Time Calculation (min) (ACUTE ONLY): 21 min  Charges:  $Gait Training: 8-22 mins                    Stacie Glaze, PT DPT Acute Rehabilitation Services Pager [phone]  Office [phone]    Louis Matte 01/27/2022, 4:24 PM

## 2022-01-27 NOTE — Assessment & Plan Note (Addendum)
High sensitive troponin elevated 191 on admission and peaked at 6368.  TTE with LVEF 40-45%, LAD territory hypokinesis.  Cardiology was consulted and patient underwent left heart catheterization 1/25 with proximal LAD-mid LAD lesion 50% stenosed, first marginal 80 sent stenosed, RPDA 100% stenosed, ostial RCA 100% stenosed.  Cardiology recommended GMDT with medical management.  Continue aspirin, Plavix, metoprolol tartrate 50 mg p.o. twice daily, atorvastatin 80 mg p.o. daily.  Outpatient follow-up scheduled with cardiology.

## 2022-01-27 NOTE — Assessment & Plan Note (Addendum)
Body mass index is 22.2 kg/m.  Nutrition Status: Nutrition Problem: Moderate Malnutrition (in the context of chronic illness) Etiology: decreased appetite Signs/Symptoms: mild fat depletion, moderate muscle depletion Interventions: Refer to RD note for recommendations Dietitian following, encourage increase oral intake, supplementation

## 2022-01-27 NOTE — Hospital Course (Signed)
Monique Cabrera is a 87 year old female with past medical history significant for chronic diastolic congestive heart failure, CKD stage V, type 2 diabetes mellitus, essential hypertension, s/p right BKA who presented to [facility] ED on 1/20 with progressive shortness of breath and chest pain over the past 3 months.  Denies nausea/vomiting.  In the ED, patient was noted to be hypertensive with SBP greater than 200.  Sodium 137, potassium 4.2, chloride 104, CO2 18, glucose 115, BUN 51, creatinine 7.50, AST 23, ALT 17, total bilirubin is at 1.0.  WBC 8.7, hemoglobin 11.0, platelets 229.  BNP greater than 4500.  High sensitive troponin 191>244.  Chest x-ray with cardiomegaly, mild interstitial edema, increase in moderate bilateral pleural effusions.  Cardiology consulted.  TRH consulted for further evaluation management of malignant hypertension, elevated troponin, CHF exacerbation and progressive renal failure.

## 2022-01-27 NOTE — TOC Progression Note (Signed)
Transition of Care [facility]) - Progression Note    Patient Details  Name: Monique Cabrera MRN: [id number] Date of Birth: 07/07/1935  Transition of [facility]) CM/SW Contact  Graves-Bigelow, Ocie Cornfield, RN Phone Number: 01/27/2022, 4:21 PM  Clinical Narrative:   Case Manager spoke with daughter Otila Kluver and she is agreeable to her mom having Physical/Occupational Therapy in the home. Daughter did not have an agency preference. Case Manager called Latricia Heft and Nanine Means they will not be able to service the patient. Case Manager then called Alvis Lemmings and they can service the patient for PT/OT and start of care to begin within 24-48 hours post transition home. Case Manager will follow for orders.    Expected Discharge Plan: [place] Barriers to Discharge: Continued Medical Work up  Expected Discharge Plan and Services Expected Discharge Plan: [place] In-house Referral: Hospice / Palliative Care (Renal Navigator.) Discharge Planning Services: CM Consult Post Acute Care Choice: [place] arrangements for the past 2 months: Single Family Home                 DME Arranged: N/A DME Agency: NA       HH Arranged: PT, OT HH Agency: [place] Date HH Agency Contacted: 01/27/22 Time [place]: 1620 Representative spoke with at [place]: [place]   Readmission Risk Interventions Readmission Risk Prevention Plan 01/25/2022 11/02/2019  Transportation Screening Complete Complete  PCP or Specialist Appt within 3-5 Days Complete Complete  HRI or [place] Complete Complete  Social Work Consult for [place] Planning/Counseling Complete Complete  Palliative Care Screening Complete Not Applicable  Medication Review Press photographer) Complete Referral to Pharmacy  Some recent data might be hidden

## 2022-01-28 ENCOUNTER — Inpatient Hospital Stay (HOSPITAL_COMMUNITY): Payer: Medicare Other

## 2022-01-28 ENCOUNTER — Encounter (HOSPITAL_COMMUNITY): Payer: Medicare Other

## 2022-01-28 DIAGNOSIS — N186 End stage renal disease: Secondary | ICD-10-CM

## 2022-01-28 LAB — CBC WITH DIFFERENTIAL/PLATELET
Abs Immature Granulocytes: 0.12 10*3/uL — ABNORMAL HIGH (ref 0.00–0.07)
Basophils Absolute: 0 10*3/uL (ref 0.0–0.1)
Basophils Relative: 0 %
Eosinophils Absolute: 0.1 10*3/uL (ref 0.0–0.5)
Eosinophils Relative: 1 %
HCT: 26 % — ABNORMAL LOW (ref 36.0–46.0)
Hemoglobin: 8.2 g/dL — ABNORMAL LOW (ref 12.0–15.0)
Immature Granulocytes: 2 %
Lymphocytes Relative: 12 %
Lymphs Abs: 1 10*3/uL (ref 0.7–4.0)
MCH: 31.1 pg (ref 26.0–34.0)
MCHC: 31.5 g/dL (ref 30.0–36.0)
MCV: 98.5 fL (ref 80.0–100.0)
Monocytes Absolute: 1.3 10*3/uL — ABNORMAL HIGH (ref 0.1–1.0)
Monocytes Relative: 16 %
Neutro Abs: 5.8 10*3/uL (ref 1.7–7.7)
Neutrophils Relative %: 69 %
Platelets: 176 10*3/uL (ref 150–400)
RBC: 2.64 MIL/uL — ABNORMAL LOW (ref 3.87–5.11)
RDW: 14.6 % (ref 11.5–15.5)
WBC: 8.3 10*3/uL (ref 4.0–10.5)
nRBC: 0 % (ref 0.0–0.2)

## 2022-01-28 LAB — GLUCOSE, CAPILLARY
Glucose-Capillary: 108 mg/dL — ABNORMAL HIGH (ref 70–99)
Glucose-Capillary: 111 mg/dL — ABNORMAL HIGH (ref 70–99)
Glucose-Capillary: 111 mg/dL — ABNORMAL HIGH (ref 70–99)
Glucose-Capillary: 115 mg/dL — ABNORMAL HIGH (ref 70–99)
Glucose-Capillary: 117 mg/dL — ABNORMAL HIGH (ref 70–99)
Glucose-Capillary: 125 mg/dL — ABNORMAL HIGH (ref 70–99)

## 2022-01-28 LAB — RENAL FUNCTION PANEL
Albumin: 2.7 g/dL — ABNORMAL LOW (ref 3.5–5.0)
Anion gap: 16 — ABNORMAL HIGH (ref 5–15)
BUN: 24 mg/dL — ABNORMAL HIGH (ref 8–23)
CO2: 21 mmol/L — ABNORMAL LOW (ref 22–32)
Calcium: 8.8 mg/dL — ABNORMAL LOW (ref 8.9–10.3)
Chloride: 96 mmol/L — ABNORMAL LOW (ref 98–111)
Creatinine, Ser: 5.1 mg/dL — ABNORMAL HIGH (ref 0.44–1.00)
GFR, Estimated: 8 mL/min — ABNORMAL LOW (ref >60)
Glucose, Bld: 114 mg/dL — ABNORMAL HIGH (ref 70–99)
Phosphorus: 3.8 mg/dL (ref 2.5–4.6)
Potassium: 3.8 mmol/L (ref 3.5–5.1)
Sodium: 133 mmol/L — ABNORMAL LOW (ref 135–145)

## 2022-01-28 NOTE — Progress Notes (Signed)
OT Cancellation Note  Patient Details Name: Monique Cabrera MRN: [id number] DOB: 1935-11-28   Cancelled Treatment:    Reason Eval/Treat Not Completed: Patient declined, no reason specified (Patient stated she did not feel well and did not want to participate.  Will attempt later today if time allows.) Lodema Hong, [place]  Pager [phone] Office [phone]  Trixie Dredge 01/28/2022, 1:25 PM

## 2022-01-28 NOTE — Progress Notes (Signed)
Progress Note   Patient: Monique Cabrera GUR:[id number] DOB: 06/21/1935 DOA: 01/15/2022     13 DOS: the patient was seen and examined on 01/28/2022   Brief hospital course: Monique Cabrera is a 87 year old female with past medical history significant for chronic diastolic congestive heart failure, CKD stage V, type 2 diabetes mellitus, essential hypertension, s/p right BKA who presented to [facility] ED on 1/20 with progressive shortness of breath and chest pain over the past 3 months.  Denies nausea/vomiting.  In the ED, patient was noted to be hypertensive with SBP greater than 200.  Sodium 137, potassium 4.2, chloride 104, CO2 18, glucose 115, BUN 51, creatinine 7.50, AST 23, ALT 17, total bilirubin is at 1.0.  WBC 8.7, hemoglobin 11.0, platelets 229.  BNP greater than 4500.  High sensitive troponin 191>244.  Chest x-ray with cardiomegaly, mild interstitial edema, increase in moderate bilateral pleural effusions.  Cardiology consulted.  TRH consulted for further evaluation management of malignant hypertension, elevated troponin, CHF exacerbation and progressive renal failure.   Assessment and Plan: * Malignant hypertension- (present on admission) Upon presentation to the ED, SBP's noted to be in the 200s.  Likely secondary to worsening renal failure, volume overload. --Metoprolol tartrate 50 mg p.o. BID --Hydralazine 75 mg PO TID --Continue monitor BP and adjust antihypertensives as needed  Acute on chronic diastolic CHF (congestive heart failure) ([name])- (present on admission) Patient presenting with progressive shortness of breath, noted elevated BNP greater than 4500 with chest x-ray findings of vascular congestion and worsening pleural effusions.  Likely secondary to worsening renal failure; now started on dialysis. --Continue volume management per HD  Chronic kidney disease (CKD), stage V ([name])- (present on admission) Patient with Hx of CKD stage 5, follows with [facility].  Per last  office notes in December 2022, patient declined renal replacement therapy.  She wanted to avoid hospitalizations.  Patient with creatinine elevated 7.50 with elevated BUN; now agreeing for hemodialysis. --Nephrology following, appreciate assistance --Continue HD on TTS schedule; outpatient seat obtained at [facility] clinic --pending AV fistula vs graft placement and tunneled TDC on 2/3 by vascular surgery --NPO after midnight   Type 2 diabetes mellitus with renal complication ([name])- (present on admission) Hemoglobin A1c 5.7, well controlled.  Home medications include Lantus 20 units [place] daily, Humalog 3 units TIDAC --SSI for coverage --CBGs qAC/HS  Non-ST elevation (NSTEMI) myocardial infarction [name])- (present on admission) High sensitive troponin elevated 191 on admission and peaked at 6368.  TTE with LVEF 40-45%, LAD territory hypokinesis.  Cardiology was consulted and patient underwent left heart catheterization 1/25 with proximal LAD-mid LAD lesion 50% stenosed, first marginal 80 sent stenosed, RPDA 100% stenosed, ostial RCA 100% stenosed.  Cardiology recommended GMD T with medical management --Aspirin --Plavix --Metoprolol tartrate 50 mg p.o. twice daily --Atorvastatin p.o. daily  Elevated troponin Cardiology has been consulted to manage pt's elevated troponin. Check echo in AM. EKG without acute ST-T changes.   Anemia of chronic renal failure- (present on admission) Hemoglobin 8.2, stable --Transfuse for hemoglobin less than 7.0 or active bleeding.  Malnutrition of moderate degree- (present on admission) Body mass index is 22.2 kg/m.  Nutrition Status: Nutrition Problem: Moderate Malnutrition (in the context of chronic illness) Etiology: decreased appetite Signs/Symptoms: mild fat depletion, moderate muscle depletion Interventions: Refer to RD note for recommendations -- Dietitian following, encourage increase oral intake, supplementation    Status post below-knee  amputation of right lower extremity ([name])- (present on admission) Chronic.  Hyperlipidemia- (present on admission) --  Atorvastatin 80 mg p.o. daily  Physical deconditioning- (present on admission) PT/OT currently recommending home health. --Continue therapy efforts while inpatient  DNR (do not resuscitate)/DNI(Do Not Intubate)- (present on admission) Has a DNR form that was signed in April 2021.     Pleural effusion, bilateral On RA. If SOB becomes an issues, may need bilateral thoracenteses.        Subjective:  Patient seen examined bedside, resting comfortably.  Sitting in bedside chair.  No specific complaints this morning.  Awaiting planned AV fistula versus graft placement with tunneled HD catheter tomorrow.  Possible discharge home following procedure versus Saturday depending on the clinical course.  No other specific questions or concerns at this time.  Denies headache, no dizziness, no chest pain, no shortness of breath, no abdominal pain, no fever/chills/night sweats, no nausea/vomiting/diarrhea, no weakness, no fatigue, no paresthesias.  No acute events overnight per nursing staff.  Physical Exam: Vitals:   01/28/22 0002 01/28/22 0457 01/28/22 0941 01/28/22 1030  BP: (!) 151/80 (!) 155/79  (!) 164/85  Pulse: 70 78 84   Resp: 16 19 (!) 22   Temp: 97.6 F (36.4 C) 97.8 F (36.6 C) (!) 97.4 F (36.3 C)   TempSrc: Oral Oral Oral   SpO2: 97% 100% 99%   Weight:  57.2 kg    Height:       Physical Exam GEN: 87 yo female in NAD, alert and oriented x 3, chronically ill in appearance HEENT: NCAT, PERRL, EOMI, sclera clear, MMM PULM: CTAB w/o wheezes/crackles, normal respiratory effort, on room air CV: RRR w/o M/G/R, temporary right IJ HD catheter noted GI: abd soft, NTND, NABS, no R/G/M MSK: no peripheral edema, muscle strength globally intact 5/5 bilateral upper/lower extremities NEURO: CN II-XII intact, no focal deficits, sensation to light touch intact PSYCH:  normal mood/affect Integumentary: dry/intact, no rashes or wounds   Data Reviewed:  BMP and CBC reviewed this morning  Family Communication: No family present at bedside this morning  Disposition: Status is: Inpatient Remains inpatient appropriate because: Pending AV fistula versus graft placement with tunneled HD catheter planned by vascular surgery, Dr. Doren Custard tomorrow.  Possible discharge home following the procedure versus discharge on Saturday depending on clinical course.  Has HD seat at [facility] clinic on a Tuesday Thursday Saturday schedule set up.          Planned Discharge Destination: Home with Home Health     Time spent: 42 minutes spent on chart review, discussion with nursing staff, consultants, updating family and interview/physical exam; more than 50% of that time was spent in counseling and/or coordination of care.  Author: Donnamarie Poag [place] DO 01/28/2022 12:13 PM  For on call review [email or web address].

## 2022-01-28 NOTE — Progress Notes (Signed)
Bilateral upper extremity vein mapping completed. Refer to "CV Proc" under chart review to view preliminary results.  01/28/2022 11:25 AM Kelby Aline., MHA, RVT, RDCS, RDMS

## 2022-01-28 NOTE — Progress Notes (Signed)
Subjective: Patient continues to feel well today and has no complaints.  Walking around room  Objective Vital signs in last 24 hours: Vitals:   01/27/22 1515 01/27/22 2008 01/28/22 0002 01/28/22 0457  BP: (!) 167/68 (!) 154/75 (!) 151/80 (!) 155/79  Pulse:  79 70 78  Resp:  17 16 19   Temp:  97.6 F (36.4 C) 97.6 F (36.4 C) 97.8 F (36.6 C)  TempSrc:  Oral Oral Oral  SpO2:  98% 97% 100%  Weight:    57.2 kg  Height:       Weight change: -3.301 kg  Intake/Output Summary (Last 24 hours) at 01/28/2022 7616 Last data filed at 01/27/2022 2100 Gross per 24 hour  Intake --  Output 180 ml  Net -180 ml    Assessment/ Plan: Pt is a 87 y.o. yo female with known advanced CKD who was admitted on 01/15/2022 with acute coronary event  Assessment/Plan: 1. Acute coronary event-  cath done-  showed blockage but collaterals-  nothing to intervene upon-  medical management -  seems more stable in that regard  2. Renal-  apparently had been asked several times as OP if she wanted dialysis and she always declined even when faced with decision that she could die without HD -  documented in the [facility] record.  Palliative care meeting took place on 1/23 and decision was made to go ahead and pursue HD wanting to do what we could to prolong life.    -Plan for dialysis today to maintain a TTS schedule -Has been accepted for [facility] schedule -VVS consulted; plan for AVF/G on Friday; appreciate help   3. Anemia-hemoglobin stable around 8.  Iron stores replete.  ESA on board. Increase to 192mcg weekly starting today  4. HTN/volume-blood pressure has improved with medications and optimization of fluid status  5. Bones- started phoslo and vitamin D based on labs  Dispo: The patient will be stable for discharge from nephrology perspective as early as tomorrow for access creation.  If she does stay into Saturday we will plan for dialysis that morning.     Reesa Chew    Labs: Basic Metabolic  Panel: Recent Labs  Lab 01/26/22 0706 01/27/22 0305 01/28/22 0500  NA 133* 135 133*  K 3.4* 3.6 3.8  CL 98 99 96*  CO2 25 24 21*  GLUCOSE 122* 100* 114*  BUN 12 17 24*  CREATININE 3.42* 4.11* 5.10*  CALCIUM 8.6* 9.0 8.8*  PHOS 2.7 3.2 3.8   Liver Function Tests: Recent Labs  Lab 01/26/22 0706 01/27/22 0305 01/28/22 0500  ALBUMIN 2.4* 2.5* 2.7*   No results for input(s): LIPASE, AMYLASE in the last 168 hours.  No results for input(s): AMMONIA in the last 168 hours. CBC: Recent Labs  Lab 01/24/22 0454 01/25/22 0600 01/26/22 0706 01/27/22 0305 01/28/22 0500  WBC 10.2 8.2 7.7 7.7 8.3  NEUTROABS 7.6 5.6 5.2 5.0 5.8  HGB 8.3* 8.1* 8.2* 8.1* 8.2*  HCT 26.5* 24.6* 25.9* 25.5* 26.0*  MCV 98.1 95.7 97.4 97.3 98.5  PLT 191 163 170 163 176   Cardiac Enzymes: No results for input(s): CKTOTAL, CKMB, CKMBINDEX, TROPONINI in the last 168 hours. CBG: Recent Labs  Lab 01/27/22 1719 01/27/22 2006 01/28/22 0001 01/28/22 0451 01/28/22 0735  GLUCAP 118* 118* 111* 108* 117*    Iron Studies:  No results for input(s): IRON, TIBC, TRANSFERRIN, FERRITIN in the last 72 hours.  Studies/Results: VAS [place] UPPER EXTREMITY ARTERIAL DUPLEX  Result Date: 01/27/2022  UPPER EXTREMITY DUPLEX STUDY Patient Name:  ROBEN TATSCH  Date of Exam:   01/27/2022 Medical Rec #: [id number]         Accession #:    [id number] Date of Birth: 06-27-1935         Patient Gender: F Patient Age:   87 years Exam Location:  [facility] Procedure:      VAS [place] UPPER EXTREMITY ARTERIAL DUPLEX Referring Phys: Harrell Gave DICKSON --------------------------------------------------------------------------------  Indications: Pre-op AVF.  Risk Factors:  Hypertension, hyperlipidemia, Diabetes, past history of smoking,                prior MI. Other Factors: CHF, PAD, CKD5 Comparison Study: No previous exams Performing Technologist: Jody Hill RVT, RDMS  Examination Guidelines: A complete evaluation includes B-mode  imaging, spectral Doppler, color Doppler, and power Doppler as needed of all accessible portions of each vessel. Bilateral testing is considered an integral part of a complete examination. Limited examinations for reoccurring indications may be performed as noted.  Right Doppler Findings: +-------------+----------+--------+--------+--------+  Site          PSV (cm/s) Waveform Stenosis Comments  +-------------+----------+--------+--------+--------+  Brachial Dist 44                                     +-------------+----------+--------+--------+--------+  Radial Prox   60                                     +-------------+----------+--------+--------+--------+  Radial Mid    47                                     +-------------+----------+--------+--------+--------+  Radial Dist   38                                     +-------------+----------+--------+--------+--------+  Ulnar Prox    21                                     +-------------+----------+--------+--------+--------+  Ulnar Mid     23                                     +-------------+----------+--------+--------+--------+   Right Pre-Dialysis Findings: +----------------------+---------+-------------------+----------+--------------+  Location               PSV       Intralum. Diam.     Waveform   Comments                                (cm/s)    (cm)                                           +----------------------+---------+-------------------+----------+--------------+  Brachial Antecub.      27        0.43  biphasic                    fossa                                                                           +----------------------+---------+-------------------+----------+--------------+  Radial Art at Wrist    38        0.14                monophasic calcific                                                                         plaque          +----------------------+---------+-------------------+----------+--------------+  Ulnar  Art at Wrist     0                             absent     calcific                                                                         plaque          +----------------------+---------+-------------------+----------+--------------+   Left Doppler Findings: +-------------+----------+--------+--------+--------+  Site          PSV (cm/s) Waveform Stenosis Comments  +-------------+----------+--------+--------+--------+  Brachial Dist 37                                     +-------------+----------+--------+--------+--------+  Radial Prox   25                                     +-------------+----------+--------+--------+--------+  Radial Mid    47                                     +-------------+----------+--------+--------+--------+  Radial Dist   30                                     +-------------+----------+--------+--------+--------+  Ulnar Prox    36                                     +-------------+----------+--------+--------+--------+  Ulnar Mid     31                                     +-------------+----------+--------+--------+--------+  Left Pre-Dialysis Findings: +----------------------+---------+-------------------+----------+--------------+  Location               PSV       Intralum. Diam.     Waveform   Comments                                (cm/s)    (cm)                                           +----------------------+---------+-------------------+----------+--------------+  Brachial Antecub.      37        0.40                biphasic                    fossa                                                                           +----------------------+---------+-------------------+----------+--------------+  Radial Art at Wrist    30        0.12                monophasic calcific                                                                         plaque          +----------------------+---------+-------------------+----------+--------------+  Ulnar Art at Wrist     0                              absent     calcific                                                                         plaque          +----------------------+---------+-------------------+----------+--------------+  Summary:  Right: Medial calcification noted in both radial and ulnar arteries.        Distal ulnar artery is occluded. Left: Medial calcification noted in both radial and ulnar arteries.       Distal ulnar artery is occluded. *See table(s) above for measurements and observations. Electronically signed by Deitra Mayo MD on 01/27/2022 at 2:43:37 PM.    Final    Medications: Infusions:  sodium chloride      Scheduled Medications:  vitamin C  250 mg Oral Daily   aspirin EC  81 mg Oral Daily   atorvastatin  80 mg Oral Daily   calcitRIOL  0.25 mcg Oral Daily   calcium acetate  667 mg Oral TID WC   Chlorhexidine Gluconate Cloth  6 each Topical Q0600   clopidogrel  75 mg Oral Q breakfast   darbepoetin (ARANESP) injection - DIALYSIS  150 mcg Intravenous Q Thu-HD   feeding supplement (GLUCERNA SHAKE)  237 mL Oral BID BM   hydrALAZINE  75 mg Oral Q8H   insulin aspart  0-6 Units Subcutaneous TID WC   metoCLOPramide (REGLAN) injection  5 mg Intravenous TID WC & HS   metoprolol tartrate  50 mg Oral BID   multivitamin  1 tablet Oral QHS   pantoprazole  40 mg Oral Daily   sodium chloride flush  3 mL Intravenous Q12H   sodium chloride flush  3 mL Intravenous Q12H    have reviewed scheduled and prn medications.  Physical Exam: General: thin, sitting in chair, no distress Heart: Normal rate Lungs: Bilateral chest rise with no increased work of breathing Abdomen: soft, non tender Extremities: Trace edema in the ankles, warm and well perfused neuro: Awake, oriented to person and place    01/28/2022,9:51 AM  LOS: 13 days

## 2022-01-28 NOTE — Plan of Care (Signed)
°  Problem: Clinical Measurements: Goal: Ability to maintain clinical measurements within normal limits will improve Outcome: Progressing   Problem: Clinical Measurements: Goal: Respiratory complications will improve Outcome: Progressing   Problem: Clinical Measurements: Goal: Cardiovascular complication will be avoided Outcome: Progressing   Problem: Nutrition: Goal: Adequate nutrition will be maintained Outcome: Progressing   Problem: Pain Managment: Goal: General experience of comfort will improve Outcome: Progressing   Problem: Safety: Goal: Ability to remain free from injury will improve Outcome: Progressing   Problem: Cardiac: Goal: Ability to achieve and maintain adequate cardiopulmonary perfusion will improve Outcome: Progressing

## 2022-01-28 NOTE — Progress Notes (Signed)
VASCULAR SURGERY ASSESSMENT & PLAN:   END-STAGE RENAL DISEASE: She is scheduled for a left arm AV fistula or AV graft tomorrow.  Her arterial duplex scan shows evidence of upper extremity arterial occlusive disease.  On both sides there is a monophasic radial signal with an absent ulnar signal.  The radial and ulnar arteries are calcified.  The brachial artery on the left measures 4 mm in diameter.  Her vein map is pending.  I plan on placement of an AV fistula or AV graft tomorrow.  She will be at increased risk for steal given her arterial duplex findings.  We will try everything possible to prevent steal but I have explained that this is certainly a risk.  She will also need her temporary catheter changed to a tunneled dialysis catheter.  All of her questions were answered and she is agreeable to proceed.  Orders have been written.   SUBJECTIVE:   No specific complaints this morning.  PHYSICAL EXAM:   Vitals:   01/27/22 1515 01/27/22 2008 01/28/22 0002 01/28/22 0457  BP: (!) 167/68 (!) 154/75 (!) 151/80 (!) 155/79  Pulse:  79 70 78  Resp:  17 16 19   Temp:  97.6 F (36.4 C) 97.6 F (36.4 C) 97.8 F (36.6 C)  TempSrc:  Oral Oral Oral  SpO2:  98% 97% 100%  Weight:    57.2 kg  Height:       Weakly palpable left radial pulse.  LABS:   Lab Results  Component Value Date   WBC 8.3 01/28/2022   HGB 8.2 (L) 01/28/2022   HCT 26.0 (L) 01/28/2022   MCV 98.5 01/28/2022   PLT 176 01/28/2022   Lab Results  Component Value Date   CREATININE 5.10 (H) 01/28/2022   Lab Results  Component Value Date   INR 0.9 05/21/2020   CBG (last 3)  Recent Labs    01/28/22 0001 01/28/22 0451 01/28/22 0735  GLUCAP 111* 108* 117*    PROBLEM LIST:    Principal Problem:   Malignant hypertension Active Problems:   Type 2 diabetes mellitus with renal complication (HCC)   Hyperlipidemia   Status post below-knee amputation of right lower extremity (HCC)   Chronic kidney disease (CKD),  stage V (HCC)   Anemia of chronic renal failure   Acute on chronic diastolic CHF (congestive heart failure) (HCC)   DNR (do not resuscitate)/DNI(Do Not Intubate)   Non-ST elevation (NSTEMI) myocardial infarction (HCC)   Malnutrition of moderate degree   Physical deconditioning   CURRENT MEDS:    vitamin C  250 mg Oral Daily   aspirin EC  81 mg Oral Daily   atorvastatin  80 mg Oral Daily   calcitRIOL  0.25 mcg Oral Daily   calcium acetate  667 mg Oral TID WC   Chlorhexidine Gluconate Cloth  6 each Topical Q0600   clopidogrel  75 mg Oral Q breakfast   darbepoetin (ARANESP) injection - DIALYSIS  150 mcg Intravenous Q Thu-HD   feeding supplement (GLUCERNA SHAKE)  237 mL Oral BID BM   hydrALAZINE  75 mg Oral Q8H   insulin aspart  0-6 Units Subcutaneous TID WC   metoCLOPramide (REGLAN) injection  5 mg Intravenous TID WC & HS   metoprolol tartrate  50 mg Oral BID   multivitamin  1 tablet Oral QHS   pantoprazole  40 mg Oral Daily   sodium chloride flush  3 mL Intravenous Q12H   sodium chloride flush  3 mL Intravenous Q12H  Deitra Mayo Office: [phone] 01/28/2022

## 2022-01-29 ENCOUNTER — Inpatient Hospital Stay (HOSPITAL_COMMUNITY): Payer: Medicare Other | Admitting: Anesthesiology

## 2022-01-29 ENCOUNTER — Encounter (HOSPITAL_COMMUNITY)
Admission: EM | Disposition: A | Discharge status: Home Health | Payer: Self-pay | Source: Home / Self Care | Attending: Internal Medicine

## 2022-01-29 ENCOUNTER — Inpatient Hospital Stay (HOSPITAL_COMMUNITY): Payer: Medicare Other

## 2022-01-29 ENCOUNTER — Other Ambulatory Visit: Payer: Self-pay

## 2022-01-29 DIAGNOSIS — Z992 Dependence on renal dialysis: Secondary | ICD-10-CM

## 2022-01-29 DIAGNOSIS — N186 End stage renal disease: Secondary | ICD-10-CM

## 2022-01-29 HISTORY — PX: ULTRASOUND GUIDANCE FOR VASCULAR ACCESS: SHX6516

## 2022-01-29 HISTORY — PX: AV FISTULA PLACEMENT: SHX1204

## 2022-01-29 HISTORY — PX: INSERTION OF DIALYSIS CATHETER: SHX1324

## 2022-01-29 LAB — RENAL FUNCTION PANEL
Albumin: 2.8 g/dL — ABNORMAL LOW (ref 3.5–5.0)
Anion gap: 15 (ref 5–15)
BUN: 30 mg/dL — ABNORMAL HIGH (ref 8–23)
CO2: 20 mmol/L — ABNORMAL LOW (ref 22–32)
Calcium: 8.9 mg/dL (ref 8.9–10.3)
Chloride: 98 mmol/L (ref 98–111)
Creatinine, Ser: 6 mg/dL — ABNORMAL HIGH (ref 0.44–1.00)
GFR, Estimated: 6 mL/min — ABNORMAL LOW (ref >60)
Glucose, Bld: 124 mg/dL — ABNORMAL HIGH (ref 70–99)
Phosphorus: 4.3 mg/dL (ref 2.5–4.6)
Potassium: 3.9 mmol/L (ref 3.5–5.1)
Sodium: 133 mmol/L — ABNORMAL LOW (ref 135–145)

## 2022-01-29 LAB — CBC WITH DIFFERENTIAL/PLATELET
Abs Immature Granulocytes: 0.12 10*3/uL — ABNORMAL HIGH (ref 0.00–0.07)
Basophils Absolute: 0 10*3/uL (ref 0.0–0.1)
Basophils Relative: 0 %
Eosinophils Absolute: 0.1 10*3/uL (ref 0.0–0.5)
Eosinophils Relative: 1 %
HCT: 26.2 % — ABNORMAL LOW (ref 36.0–46.0)
Hemoglobin: 8.4 g/dL — ABNORMAL LOW (ref 12.0–15.0)
Immature Granulocytes: 1 %
Lymphocytes Relative: 13 %
Lymphs Abs: 1.2 10*3/uL (ref 0.7–4.0)
MCH: 31.3 pg (ref 26.0–34.0)
MCHC: 32.1 g/dL (ref 30.0–36.0)
MCV: 97.8 fL (ref 80.0–100.0)
Monocytes Absolute: 1.2 10*3/uL — ABNORMAL HIGH (ref 0.1–1.0)
Monocytes Relative: 13 %
Neutro Abs: 6.8 10*3/uL (ref 1.7–7.7)
Neutrophils Relative %: 72 %
Platelets: 177 10*3/uL (ref 150–400)
RBC: 2.68 MIL/uL — ABNORMAL LOW (ref 3.87–5.11)
RDW: 14.6 % (ref 11.5–15.5)
WBC: 9.5 10*3/uL (ref 4.0–10.5)
nRBC: 0 % (ref 0.0–0.2)

## 2022-01-29 LAB — GLUCOSE, CAPILLARY
Glucose-Capillary: 121 mg/dL — ABNORMAL HIGH (ref 70–99)
Glucose-Capillary: 123 mg/dL — ABNORMAL HIGH (ref 70–99)
Glucose-Capillary: 127 mg/dL — ABNORMAL HIGH (ref 70–99)
Glucose-Capillary: 136 mg/dL — ABNORMAL HIGH (ref 70–99)

## 2022-01-29 SURGERY — ARTERIOVENOUS (AV) FISTULA CREATION
Anesthesia: General | Site: Neck | Laterality: Right

## 2022-01-29 MED ORDER — LIDOCAINE-EPINEPHRINE (PF) 1 %-1:200000 IJ SOLN
INTRAMUSCULAR | Status: DC | PRN
  Administered 2022-01-29: 28 mL

## 2022-01-29 MED ORDER — DEXAMETHASONE SODIUM PHOSPHATE 4 MG/ML IJ SOLN
INTRAMUSCULAR | Status: DC | PRN
Start: 2022-01-29 — End: 2022-01-29
  Administered 2022-01-29: 4 mg via INTRAVENOUS

## 2022-01-29 MED ORDER — HEPARIN SODIUM (PORCINE) 1000 UNIT/ML IJ SOLN
INTRAMUSCULAR | Status: DC | PRN
Start: 2022-01-29 — End: 2022-01-29
  Administered 2022-01-29: 4000 [IU] via INTRAVENOUS

## 2022-01-29 MED ORDER — 0.9 % SODIUM CHLORIDE (POUR BTL) OPTIME
TOPICAL | Status: DC | PRN
  Administered 2022-01-29: 1000 mL

## 2022-01-29 MED ORDER — LIDOCAINE 2% (20 MG/ML) 5 ML SYRINGE
INTRAMUSCULAR | Status: DC | PRN
  Administered 2022-01-29: 60 mg via INTRAVENOUS

## 2022-01-29 MED ORDER — CEFAZOLIN SODIUM-DEXTROSE 2-3 GM-%(50ML) IV SOLR
INTRAVENOUS | Status: DC | PRN
Start: 2022-01-29 — End: 2022-01-29
  Administered 2022-01-29: 2 g via INTRAVENOUS

## 2022-01-29 MED ORDER — PHENYLEPHRINE 40 MCG/ML (10ML) SYRINGE FOR IV PUSH (FOR BLOOD PRESSURE SUPPORT)
PREFILLED_SYRINGE | INTRAVENOUS | Status: AC
  Filled 2022-01-29: qty 10

## 2022-01-29 MED ORDER — FENTANYL CITRATE (PF) 100 MCG/2ML IJ SOLN
INTRAMUSCULAR | Status: DC | PRN
  Administered 2022-01-29: 50 ug via INTRAVENOUS

## 2022-01-29 MED ORDER — PHENYLEPHRINE 40 MCG/ML (10ML) SYRINGE FOR IV PUSH (FOR BLOOD PRESSURE SUPPORT)
PREFILLED_SYRINGE | INTRAVENOUS | Status: DC | PRN
  Administered 2022-01-29 (×3): 120 ug via INTRAVENOUS
  Administered 2022-01-29: 80 ug via INTRAVENOUS

## 2022-01-29 MED ORDER — PROTAMINE SULFATE 10 MG/ML IV SOLN
INTRAVENOUS | Status: DC | PRN
  Administered 2022-01-29: 10 mg via INTRAVENOUS
  Administered 2022-01-29: 30 mg via INTRAVENOUS

## 2022-01-29 MED ORDER — LIDOCAINE HCL (PF) 1 % IJ SOLN
INTRAMUSCULAR | Status: AC
  Filled 2022-01-29: qty 30

## 2022-01-29 MED ORDER — ONDANSETRON HCL 4 MG/2ML IJ SOLN
INTRAMUSCULAR | Status: AC
  Filled 2022-01-29: qty 2

## 2022-01-29 MED ORDER — DEXAMETHASONE SODIUM PHOSPHATE 10 MG/ML IJ SOLN
INTRAMUSCULAR | Status: AC
  Filled 2022-01-29: qty 1

## 2022-01-29 MED ORDER — HYDRALAZINE HCL 100 MG PO TABS: 100.0000 mg | ORAL_TABLET | Freq: Three times a day (TID) | ORAL | 2 refills | Status: AC

## 2022-01-29 MED ORDER — ONDANSETRON HCL 4 MG/2ML IJ SOLN: 4.0000 mg | Freq: Once | INTRAMUSCULAR | Status: DC | PRN

## 2022-01-29 MED ORDER — ATORVASTATIN CALCIUM 80 MG PO TABS: 80.0000 mg | ORAL_TABLET | Freq: Every day | ORAL | 2 refills | Status: AC

## 2022-01-29 MED ORDER — PHENYLEPHRINE HCL-NACL 20-0.9 MG/250ML-% IV SOLN: INTRAVENOUS | Status: DC | PRN

## 2022-01-29 MED ORDER — ONDANSETRON HCL 4 MG/2ML IJ SOLN
INTRAMUSCULAR | Status: DC | PRN
  Administered 2022-01-29: 4 mg via INTRAVENOUS

## 2022-01-29 MED ORDER — HEPARIN 6000 UNIT IRRIGATION SOLUTION
Status: DC | PRN
  Administered 2022-01-29: 1

## 2022-01-29 MED ORDER — FENTANYL CITRATE (PF) 100 MCG/2ML IJ SOLN
INTRAMUSCULAR | Status: AC
  Filled 2022-01-29: qty 2

## 2022-01-29 MED ORDER — PROPOFOL 10 MG/ML IV BOLUS
INTRAVENOUS | Status: AC
  Filled 2022-01-29: qty 40

## 2022-01-29 MED ORDER — PHENYLEPHRINE HCL-NACL 20-0.9 MG/250ML-% IV SOLN
INTRAVENOUS | Status: DC | PRN
  Administered 2022-01-29: 100 ug/min via INTRAVENOUS

## 2022-01-29 MED ORDER — PROPOFOL 10 MG/ML IV BOLUS
INTRAVENOUS | Status: DC | PRN
Start: 2022-01-29 — End: 2022-01-29
  Administered 2022-01-29: 80 mg via INTRAVENOUS

## 2022-01-29 MED ORDER — HYDRALAZINE HCL 50 MG PO TABS: 100.0000 mg | ORAL_TABLET | Freq: Three times a day (TID) | ORAL | Status: DC

## 2022-01-29 MED ORDER — CALCIUM ACETATE (PHOS BINDER) 667 MG PO CAPS: 667.0000 mg | ORAL_CAPSULE | Freq: Three times a day (TID) | ORAL | 2 refills | Status: AC

## 2022-01-29 MED ORDER — PANTOPRAZOLE SODIUM 40 MG PO TBEC: 40.0000 mg | DELAYED_RELEASE_TABLET | Freq: Every day | ORAL | 2 refills | Status: AC

## 2022-01-29 MED ORDER — RENA-VITE PO TABS: 1.0000 | ORAL_TABLET | Freq: Every day | ORAL | 2 refills | Status: AC

## 2022-01-29 MED ORDER — LIDOCAINE-EPINEPHRINE (PF) 1 %-1:200000 IJ SOLN
INTRAMUSCULAR | Status: AC
  Filled 2022-01-29: qty 30

## 2022-01-29 MED ORDER — HEMOSTATIC AGENTS (NO CHARGE) OPTIME
TOPICAL | Status: DC | PRN
  Administered 2022-01-29: 1 via TOPICAL

## 2022-01-29 MED ORDER — LIDOCAINE 2% (20 MG/ML) 5 ML SYRINGE
INTRAMUSCULAR | Status: AC
  Filled 2022-01-29: qty 5

## 2022-01-29 MED ORDER — HEPARIN SODIUM (PORCINE) 1000 UNIT/ML IJ SOLN
INTRAMUSCULAR | Status: DC | PRN
  Administered 2022-01-29: 5000 [IU] via INTRAVENOUS

## 2022-01-29 MED ORDER — METOPROLOL TARTRATE 50 MG PO TABS: 50.0000 mg | ORAL_TABLET | Freq: Two times a day (BID) | ORAL | 2 refills | Status: AC

## 2022-01-29 MED ORDER — FENTANYL CITRATE (PF) 100 MCG/2ML IJ SOLN
25.0000 ug | INTRAMUSCULAR | Status: DC | PRN
  Administered 2022-01-29: 25 ug via INTRAVENOUS

## 2022-01-29 MED ORDER — HEPARIN SODIUM (PORCINE) 1000 UNIT/ML IJ SOLN
INTRAMUSCULAR | Status: AC
  Filled 2022-01-29: qty 10

## 2022-01-29 MED ORDER — FENTANYL CITRATE (PF) 250 MCG/5ML IJ SOLN
INTRAMUSCULAR | Status: AC
  Filled 2022-01-29: qty 5

## 2022-01-29 MED ORDER — CEFAZOLIN SODIUM 1 G IJ SOLR
INTRAMUSCULAR | Status: AC
  Filled 2022-01-29: qty 20

## 2022-01-29 MED ORDER — HEPARIN 6000 UNIT IRRIGATION SOLUTION
Status: AC
  Filled 2022-01-29: qty 500

## 2022-01-29 SURGICAL SUPPLY — 48 items
ARMBAND PINK RESTRICT EXTREMIT (MISCELLANEOUS) ×8 IMPLANT
BAG COUNTER SPONGE SURGICOUNT (BAG) ×4 IMPLANT
CANISTER SUCT 3000ML PPV (MISCELLANEOUS) ×4 IMPLANT
CANNULA VESSEL 3MM 2 BLNT TIP (CANNULA) ×4 IMPLANT
CATH PALINDROME-P 23CM W/VT (CATHETERS) ×1 IMPLANT
CLIP TI WIDE RED SMALL 6 (CLIP) ×1 IMPLANT
CLIP VESOCCLUDE MED 6/CT (CLIP) ×4 IMPLANT
CLIP VESOCCLUDE SM WIDE 6/CT (CLIP) ×5 IMPLANT
COVER PROBE W GEL 5X96 (DRAPES) IMPLANT
DECANTER SPIKE VIAL GLASS SM (MISCELLANEOUS) ×4 IMPLANT
DERMABOND ADVANCED (GAUZE/BANDAGES/DRESSINGS) ×1
DERMABOND ADVANCED .7 DNX12 (GAUZE/BANDAGES/DRESSINGS) ×3 IMPLANT
DRSG COVADERM 4X6 (GAUZE/BANDAGES/DRESSINGS) ×1 IMPLANT
ELECT REM PT RETURN 9FT ADLT (ELECTROSURGICAL) ×4
ELECTRODE REM PT RTRN 9FT ADLT (ELECTROSURGICAL) ×3 IMPLANT
GAUZE 4X4 16PLY ~~LOC~~+RFID DBL (SPONGE) ×1 IMPLANT
GLOVE SRG 8 PF TXTR STRL LF DI (GLOVE) ×3 IMPLANT
GLOVE SURG ENC MOIS LTX SZ7.5 (GLOVE) ×4 IMPLANT
GLOVE SURG POLY ORTHO LF SZ7.5 (GLOVE) IMPLANT
GLOVE SURG UNDER LTX SZ8 (GLOVE) ×4 IMPLANT
GLOVE SURG UNDER POLY LF SZ8 (GLOVE) ×4
GOWN STRL REUS W/ TWL LRG LVL3 (GOWN DISPOSABLE) ×9 IMPLANT
GOWN STRL REUS W/TWL LRG LVL3 (GOWN DISPOSABLE) ×12
GRAFT GORETEX STRT 4-7X45 (Vascular Products) ×1 IMPLANT
HEMOSTAT HEMOBLAST BELLOWS (HEMOSTASIS) ×1 IMPLANT
KIT BASIN OR (CUSTOM PROCEDURE TRAY) ×4 IMPLANT
KIT TURNOVER KIT B (KITS) ×4 IMPLANT
NDL 18GX1X1/2 (RX/OR ONLY) (NEEDLE) IMPLANT
NDL BEVEL SYR 25X1 (NEEDLE) IMPLANT
NEEDLE 18GX1X1/2 (RX/OR ONLY) (NEEDLE) ×4 IMPLANT
NEEDLE BEVEL SYR 25X1 (NEEDLE) ×4 IMPLANT
NS IRRIG 1000ML POUR BTL (IV SOLUTION) ×4 IMPLANT
PACK CV ACCESS (CUSTOM PROCEDURE TRAY) ×4 IMPLANT
PAD ARMBOARD 7.5X6 YLW CONV (MISCELLANEOUS) ×8 IMPLANT
PADDING CAST ABS 4INX4YD NS (CAST SUPPLIES) ×1
PADDING CAST ABS COTTON 4X4 ST (CAST SUPPLIES) IMPLANT
SPONGE SURGIFOAM ABS GEL 100 (HEMOSTASIS) IMPLANT
SPONGE T-LAP 18X18 ~~LOC~~+RFID (SPONGE) ×2 IMPLANT
SUT ETHILON 3 0 PS 1 (SUTURE) ×1 IMPLANT
SUT MNCRL AB 4-0 PS2 18 (SUTURE) ×6 IMPLANT
SUT PROLENE 6 0 BV (SUTURE) ×7 IMPLANT
SUT SILK 2 0 SH (SUTURE) ×1 IMPLANT
SUT VIC AB 3-0 SH 27 (SUTURE) ×8
SUT VIC AB 3-0 SH 27X BRD (SUTURE) ×3 IMPLANT
SYR 10ML LL (SYRINGE) ×1 IMPLANT
TOWEL GREEN STERILE (TOWEL DISPOSABLE) ×4 IMPLANT
UNDERPAD 30X36 HEAVY ABSORB (UNDERPADS AND DIAPERS) ×4 IMPLANT
WATER STERILE IRR 1000ML POUR (IV SOLUTION) ×4 IMPLANT

## 2022-01-29 NOTE — Progress Notes (Addendum)
Physical Therapy Treatment Patient Details Name: Monique Cabrera MRN: [id number] DOB: 11-18-1935 Today's Date: 01/29/2022   History of Present Illness 87 y/o female admitted 1/20 with HTN urgency and NSTEMI. Pt is s/p heart cath via Rt groin on 1/25. Pt has begun HD since admission with 3 temp HD caths placed with last in IR on 1/26. 2/3 fistula placed. PMHx:CKD, R BKA, dCHF, HTN, DM, diabetic retinopathy, PAD, hep C.    PT Comments    Pt with orders for D/C yet remains lethargic and groggy post procedure. RN direct request to assess and assist pt for readiness for D/C. Pt not oriented other than to self with slow processing and physical assist to don all clothes and transfer from bed to chair with RW. RN aware of safety concerns and fall risk in current state with hands on assist to prevent falls at this time.     Recommendations for follow up therapy are one component of a multi-disciplinary discharge planning process, led by the attending physician.  Recommendations may be updated based on patient status, additional functional criteria and insurance authorization.  Follow Up Recommendations  Home health PT     Assistance Recommended at Discharge Frequent or constant Supervision/Assistance  Patient can return home with the following A little help with walking and/or transfers;A little help with bathing/dressing/bathroom;Help with stairs or ramp for entrance;Assist for transportation;Assistance with cooking/housework   Equipment Recommendations  None recommended by PT    Recommendations for Other Services       Precautions / Restrictions Precautions Precautions: Fall Precaution Comments: R BKA at baseline, has prosthetic     Mobility  Bed Mobility Overal bed mobility: Needs Assistance Bed Mobility: Supine to Sit     Supine to sit: Min assist, HOB elevated     General bed mobility comments: HOB 20 degrees with min assist to sit EOB. pt able to return to trunk in supine to  pull up underwear and pants with assist to regain sitting    Transfers Overall transfer level: Needs assistance   Transfers: Sit to/from Stand, Bed to chair/wheelchair/BSC Sit to Stand: Min assist Stand pivot transfers: Min assist         General transfer comment: min assist with cues for sequence to rise from surface with RW x 2 and pivot bed to chair. Pt needing cues to clear right foot with stepping    Ambulation/Gait               General Gait Details: not safe to attempt based on cognition   Stairs             Wheelchair Mobility    Modified Rankin (Stroke Patients Only)       Balance Overall balance assessment: Needs assistance   Sitting balance-Leahy Scale: Poor Sitting balance - Comments: guarding EOB   Standing balance support: Bilateral upper extremity supported Standing balance-Leahy Scale: Poor Standing balance comment: bil UE support on RW with min assist for balance                            Cognition Arousal/Alertness: Awake/alert Behavior During Therapy: Flat affect Overall Cognitive Status: Impaired/Different from baseline Area of Impairment: Memory, Orientation, Attention                 Orientation Level: Disoriented to, Time, Situation Current Attention Level: Focused Memory: Decreased short-term memory         General Comments: pt remains  very groggy after anesthesia and not oriented other than self, increased processing time with decreased memory and safety        Exercises      General Comments        Pertinent Vitals/Pain Pain Assessment Pain Assessment: No/denies pain    Home Living                          Prior Function            PT Goals (current goals can now be found in the care plan section) Progress towards PT goals: Not progressing toward goals - comment (due to decreased cognition, lethargy post procedure)    Frequency    Min 3X/week      PT Plan Current  plan remains appropriate    Co-evaluation              AM-PAC PT "6 Clicks" Mobility   Outcome Measure  Help needed turning from your back to your side while in a flat bed without using bedrails?: A Little Help needed moving from lying on your back to sitting on the side of a flat bed without using bedrails?: A Little Help needed moving to and from a bed to a chair (including a wheelchair)?: A Little Help needed standing up from a chair using your arms (e.g., wheelchair or bedside chair)?: A Little Help needed to walk in hospital room?: A Lot Help needed climbing 3-5 steps with a railing? : Total 6 Click Score: 15    End of Session   Activity Tolerance: Patient limited by lethargy Patient left: in chair;with call bell/phone within reach;with chair alarm set Nurse Communication: Mobility status;Precautions PT Visit Diagnosis: Unsteadiness on feet (R26.81);Muscle weakness (generalized) (M62.81)     Time: 6283-6629 PT Time Calculation (min) (ACUTE ONLY): 20 min  Charges:  $Therapeutic Activity: 8-22 mins                     Jessicamarie Amiri P, PT Acute Rehabilitation Services Pager: [phone] Office: [place] 01/29/2022, 1:58 PM

## 2022-01-29 NOTE — Interval H&P Note (Signed)
History and Physical Interval Note:  01/29/2022 7:15 AM  Monique Cabrera  has presented today for surgery, with the diagnosis of END STAGE RENAL DISEASE.  The various methods of treatment have been discussed with the patient and family. After consideration of risks, benefits and other options for treatment, the patient has consented to  Procedure(s): LEFT ARM ARTERIOVENOUS (AV) FISTULA CREATION VERSUS GRAFT (Left) as a surgical intervention.  The patient's history has been reviewed, patient examined, no change in status, stable for surgery.  I have reviewed the patient's chart and labs.  Questions were answered to the patient's satisfaction.     Deitra Mayo

## 2022-01-29 NOTE — Progress Notes (Signed)
Reviewed d/c instructions and new meds with daughter, updated daughter on pt's procedure today and pt still being alittle weak, daughter verbalizes she will be able to hold on to the pt and help her walk from car to the house. No distress noted, AV site clean dry intact. PIV d/c.

## 2022-01-29 NOTE — Anesthesia Procedure Notes (Signed)
Procedure Name: LMA Insertion Date/Time: 01/29/2022 7:37 AM Performed by: Lieutenant Diego, CRNA Pre-anesthesia Checklist: Patient identified, Emergency Drugs available, Suction available and Patient being monitored Patient Re-evaluated:Patient Re-evaluated prior to induction Oxygen Delivery Method: Circle system utilized Preoxygenation: Pre-oxygenation with 100% oxygen Induction Type: IV induction Ventilation: Mask ventilation without difficulty LMA: LMA inserted LMA Size: 4.0 Number of attempts: 1 Placement Confirmation: positive ETCO2 and breath sounds checked- equal and bilateral Tube secured with: Tape Dental Injury: Teeth and Oropharynx as per pre-operative assessment

## 2022-01-29 NOTE — Anesthesia Preprocedure Evaluation (Addendum)
Anesthesia Evaluation  Patient identified by MRN, date of birth, ID band Patient awake    Reviewed: Allergy & Precautions, NPO status , Patient's Chart, lab work & pertinent test results, reviewed documented beta blocker date and time   Airway Mallampati: II  TM Distance: >3 FB Neck ROM: Full    Dental  (+) Dental Advisory Given, Edentulous Upper, Edentulous Lower   Pulmonary former smoker,    Pulmonary exam normal breath sounds clear to auscultation       Cardiovascular hypertension, Pt. on medications and Pt. on home beta blockers + Past MI, + Peripheral Vascular Disease and +CHF  Normal cardiovascular exam Rhythm:Regular Rate:Normal  TTE with LVEF 40-45%, LAD territory hypokinesis.   Neuro/Psych CVA negative psych ROS   GI/Hepatic negative GI ROS, Neg liver ROS,   Endo/Other  diabetes, Type 2, Insulin Dependent  Renal/GU ESRFRenal disease     Musculoskeletal  (+) Arthritis ,   Abdominal   Peds  Hematology  (+) Blood dyscrasia (Plavix), anemia ,   Anesthesia Other Findings Day of surgery medications reviewed with the patient.  Reproductive/Obstetrics                            Anesthesia Physical Anesthesia Plan  ASA: 4  Anesthesia Plan: General   Post-op Pain Management: Tylenol PO (pre-op)   Induction: Intravenous  PONV Risk Score and Plan: 3 and Dexamethasone and Ondansetron  Airway Management Planned: LMA  Additional Equipment:   Intra-op Plan:   Post-operative Plan: Extubation in OR  Informed Consent: I have reviewed the patients History and Physical, chart, labs and discussed the procedure including the risks, benefits and alternatives for the proposed anesthesia with the patient or authorized representative who has indicated his/her understanding and acceptance.     Dental advisory given  Plan Discussed with: CRNA  Anesthesia Plan Comments:          Anesthesia Quick Evaluation

## 2022-01-29 NOTE — Transfer of Care (Signed)
Immediate Anesthesia Transfer of Care Note  Patient: Monique Cabrera  Procedure(s) Performed: LEFT ARM ARTERIOVENOUS (AV) FISTULA WITH PLACEMENT OF GORE-TEX GRAFT (4-7MM STRETCH VASCULAR GRAFT) (Left: Arm Lower) INSERTION OF PALINDROME 23CM (PRECISION CHRONIC CATHETER KIT) DIALYSIS CATHETER (Right: Neck) ULTRASOUND GUIDANCE FOR VASCULAR ACCESS  Patient Location: PACU  Anesthesia Type:General  Level of Consciousness: awake  Airway & Oxygen Therapy: Patient Spontanous Breathing and Patient connected to face mask oxygen  Post-op Assessment: Report given to RN and Post -op Vital signs reviewed and stable  Post vital signs: Reviewed and stable  Last Vitals:  Vitals Value Taken Time  BP 144/78 01/29/22 1046  Temp    Pulse 65 01/29/22 1048  Resp 25 01/29/22 1048  SpO2 100 % 01/29/22 1048  Vitals shown include unvalidated device data.  Last Pain:  Vitals:   01/29/22 0447  TempSrc: Oral  PainSc:       Patients Stated Pain Goal: 0 (75/64/33 2951)  Complications: No notable events documented.

## 2022-01-29 NOTE — Progress Notes (Signed)
Pt for d/c later today. Pt will start at [facility] tomorrow. Spoke to pt's dtr, Jody, via phone to make her aware that pt will need to arrive at clinic tomorrow at 11:00 and 10:30 on Tuesday for paperwork. Dtr voices understanding. Contacted [facility] and spoke to [place]. Clinic advised pt will start tomorrow. Contacted renal PA regarding clinic's need for orders for tomorrow.   Melven Sartorius Renal Navigator [phone]

## 2022-01-29 NOTE — Progress Notes (Signed)
PT Cancellation Note  Patient Details Name: FELIPE CABELL MRN: [id number] DOB: [birth year]   Cancelled Treatment:    Reason Eval/Treat Not Completed: Patient at procedure or test/unavailable   Sheryll Dymek B Larri [place]ton 01/29/2022, 7:17 AM Bayard Males, PT Acute Rehabilitation Services Pager: [phone] Office: [phone]

## 2022-01-29 NOTE — Progress Notes (Signed)
Subjective: Patient was seen after surgery.  Groggy and unable to answer questions.  Surgery went well with no significant problems.  Objective Vital signs in last 24 hours: Vitals:   01/29/22 1100 01/29/22 1115 01/29/22 1145 01/29/22 1208  BP: (!) 127/53  (!) 107/48 (!) 113/48  Pulse: 72 77 68 69  Resp: 14 (!) 22 20 20   Temp:   (!) 97 F (36.1 C)   TempSrc:      SpO2: 95% 95% 92% 92%  Weight:      Height:       Weight change: -1.361 kg  Intake/Output Summary (Last 24 hours) at 01/29/2022 1224 Last data filed at 01/29/2022 1145 Gross per 24 hour  Intake 790 ml  Output 100 ml  Net 690 ml    Assessment/ Plan: Pt is a 87 y.o. yo female with known advanced CKD who was admitted on 01/15/2022 with acute coronary event  Assessment/Plan: 1. Acute coronary event-  cath done-  showed blockage but collaterals-  nothing to intervene upon-  medical management -  seems more stable in that regard  2. Renal-  apparently had been asked several times as OP if she wanted dialysis and she always declined even when faced with decision that she could die without HD -  documented in the [place] record.  Palliative care meeting took place on 1/23 and decision was made to go ahead and pursue HD wanting to do what we could to prolong life.    -Plan for dialysis today to maintain a TTS schedule -Missed dialysis yesterday.  Because labs look good can hold off for dialysis tomorrow.  She can likely discharge today and do dialysis tomorrow outpatient -Has been accepted for [place] TTS schedule -[facility] following; underwent AVG and tunneled dialysis catheter placement today.  Greatly appreciate help   3. Anemia-hemoglobin stable around 8.  Iron stores replete.  Maintain ESA outpatient  4. HTN/volume-blood pressure has improved with medications and optimization of fluid status  5. Bones- started phoslo and vitamin D based on labs  Dispo: The patient is stable to discharge today from nephrology perspective.  Can  start dialysis tomorrow outpatient     Monique Cabrera    Labs: Basic Metabolic Panel: Recent Labs  Lab 01/27/22 0305 01/28/22 0500 01/29/22 0500  NA 135 133* 133*  K 3.6 3.8 3.9  CL 99 96* 98  CO2 24 21* 20*  GLUCOSE 100* 114* 124*  BUN 17 24* 30*  CREATININE 4.11* 5.10* 6.00*  CALCIUM 9.0 8.8* 8.9  PHOS 3.2 3.8 4.3   Liver Function Tests: Recent Labs  Lab 01/27/22 0305 01/28/22 0500 01/29/22 0500  ALBUMIN 2.5* 2.7* 2.8*   No results for input(s): LIPASE, AMYLASE in the last 168 hours.  No results for input(s): AMMONIA in the last 168 hours. CBC: Recent Labs  Lab 01/25/22 0600 01/26/22 0706 01/27/22 0305 01/28/22 0500 01/29/22 0500  WBC 8.2 7.7 7.7 8.3 9.5  NEUTROABS 5.6 5.2 5.0 5.8 6.8  HGB 8.1* 8.2* 8.1* 8.2* 8.4*  HCT 24.6* 25.9* 25.5* 26.0* 26.2*  MCV 95.7 97.4 97.3 98.5 97.8  PLT 163 170 163 176 177   Cardiac Enzymes: No results for input(s): CKTOTAL, CKMB, CKMBINDEX, TROPONINI in the last 168 hours. CBG: Recent Labs  Lab 01/28/22 2039 01/29/22 0002 01/29/22 0429 01/29/22 1046 01/29/22 1207  GLUCAP 115* 121* 127* 123* 136*    Iron Studies:  No results for input(s): IRON, TIBC, TRANSFERRIN, FERRITIN in the last 72 hours.  Studies/Results:  DG Chest Port 1 View  Result Date: 01/29/2022 CLINICAL DATA:  Hemodialysis catheter placement EXAM: PORTABLE CHEST 1 VIEW COMPARISON:  Chest radiograph 01/15/2022 FINDINGS: There is a new tunneled right-sided catheter terminating in the lower SVC/cavoatrial junction. The heart is enlarged, unchanged. The upper mediastinal contours are stable, with dense calcified atherosclerotic plaque of the thoracic aorta. There are bilateral pleural effusions with adjacent airspace opacity likely reflecting atelectasis. Since the study from 01/15/2022, aeration of the lower lobes is slightly improved, more so on the right. The upper lungs well aerated. There is no pneumothorax. There is no acute osseous abnormality.  IMPRESSION: 1. Tunneled right-sided vascular catheter in place without evidence of complication. No pneumothorax. 2. Bilateral pleural effusions with adjacent airspace disease likely reflecting atelectasis, with slightly improved aeration particularly on the right since 01/15/2022 Electronically Signed   By: Valetta Mole M.D.   On: 01/29/2022 11:04   DG C-Arm 1-60 Min-No Report  Result Date: 01/29/2022 Fluoroscopy was utilized by the requesting physician.  No radiographic interpretation.   VAS [place] UPPER EXTREMITY ARTERIAL DUPLEX  Result Date: 01/27/2022  UPPER EXTREMITY DUPLEX STUDY Patient Name:  Monique Cabrera  Date of Exam:   01/27/2022 Medical Rec #: [id number]         Accession #:    [id number] Date of Birth: [birth year]         Patient Gender: F Patient Age:   26 years Exam Location:  [facility] Procedure:      VAS [place] UPPER EXTREMITY ARTERIAL DUPLEX Referring Phys: Harrell Gave DICKSON --------------------------------------------------------------------------------  Indications: Pre-op AVF.  Risk Factors:  Hypertension, hyperlipidemia, Diabetes, past history of smoking,                prior MI. Other Factors: CHF, PAD, CKD5 Comparison Study: No previous exams Performing Technologist: Jody Hill RVT, RDMS  Examination Guidelines: A complete evaluation includes B-mode imaging, spectral Doppler, color Doppler, and power Doppler as needed of all accessible portions of each vessel. Bilateral testing is considered an integral part of a complete examination. Limited examinations for reoccurring indications may be performed as noted.  Right Doppler Findings: +-------------+----------+--------+--------+--------+  Site          PSV (cm/s) Waveform Stenosis Comments  +-------------+----------+--------+--------+--------+  Brachial Dist 44                                     +-------------+----------+--------+--------+--------+  Radial Prox   60                                      +-------------+----------+--------+--------+--------+  Radial Mid    47                                     +-------------+----------+--------+--------+--------+  Radial Dist   38                                     +-------------+----------+--------+--------+--------+  Ulnar Prox    21                                     +-------------+----------+--------+--------+--------+  Ulnar Mid     23                                     +-------------+----------+--------+--------+--------+   Right Pre-Dialysis Findings: +----------------------+---------+-------------------+----------+--------------+  Location               PSV       Intralum. Diam.     Waveform   Comments                                (cm/s)    (cm)                                           +----------------------+---------+-------------------+----------+--------------+  Brachial Antecub.      27        0.43                biphasic                    fossa                                                                           +----------------------+---------+-------------------+----------+--------------+  Radial Art at Wrist    38        0.14                monophasic calcific                                                                         plaque          +----------------------+---------+-------------------+----------+--------------+  Ulnar Art at Wrist     0                             absent     calcific                                                                         plaque          +----------------------+---------+-------------------+----------+--------------+   Left Doppler Findings: +-------------+----------+--------+--------+--------+  Site          PSV (cm/s) Waveform Stenosis Comments  +-------------+----------+--------+--------+--------+  Brachial Dist 37                                     +-------------+----------+--------+--------+--------+  Radial Prox   25                                      +-------------+----------+--------+--------+--------+  Radial Mid    47                                     +-------------+----------+--------+--------+--------+  Radial Dist   30                                     +-------------+----------+--------+--------+--------+  Ulnar Prox    36                                     +-------------+----------+--------+--------+--------+  Ulnar Mid     31                                     +-------------+----------+--------+--------+--------+   Left Pre-Dialysis Findings: +----------------------+---------+-------------------+----------+--------------+  Location               PSV       Intralum. Diam.     Waveform   Comments                                (cm/s)    (cm)                                           +----------------------+---------+-------------------+----------+--------------+  Brachial Antecub.      37        0.40                biphasic                    fossa                                                                           +----------------------+---------+-------------------+----------+--------------+  Radial Art at Wrist    30        0.12                monophasic calcific                                                                         plaque          +----------------------+---------+-------------------+----------+--------------+  Ulnar Art at Wrist     0  absent     calcific                                                                         plaque          +----------------------+---------+-------------------+----------+--------------+  Summary:  Right: Medial calcification noted in both radial and ulnar arteries.        Distal ulnar artery is occluded. Left: Medial calcification noted in both radial and ulnar arteries.       Distal ulnar artery is occluded. *See table(s) above for measurements and observations. Electronically signed by Deitra Mayo MD on 01/27/2022 at 2:43:37 PM.    Final    VAS [place]  UPPER EXT VEIN MAPPING (PRE-OP AVF)  Result Date: 01/28/2022 [place] Patient Name:  Monique Cabrera  Date of Exam:   01/28/2022 Medical Rec #: [id number]         Accession #:    [id number] Date of Birth: [birth year]         Patient Gender: F Patient Age:   20 years Exam Location:  [facility] Procedure:      VAS [place] UPPER EXT VEIN MAPPING (PRE-OP AVF) Referring Phys: Harrell Gave DICKSON --------------------------------------------------------------------------------  Indications: Pre-access. Comparison Study: No prior study Performing Technologist: Maudry Mayhew MHA, RDMS, RVT, RDCS  Examination Guidelines: A complete evaluation includes B-mode imaging, spectral Doppler, color Doppler, and power Doppler as needed of all accessible portions of each vessel. Bilateral testing is considered an integral part of a complete examination. Limited examinations for reoccurring indications may be performed as noted. +-----------------+-------------+----------+--------------+  Right Cephalic    Diameter (cm) Depth (cm)    Findings     +-----------------+-------------+----------+--------------+  Shoulder              0.15                                 +-----------------+-------------+----------+--------------+  Prox upper arm        0.10                                 +-----------------+-------------+----------+--------------+  Mid upper arm         0.07                                 +-----------------+-------------+----------+--------------+  Dist upper arm                             not visualized  +-----------------+-------------+----------+--------------+  Antecubital fossa                          not visualized  +-----------------+-------------+----------+--------------+  Prox forearm                               not visualized  +-----------------+-------------+----------+--------------+  Mid forearm  not visualized   +-----------------+-------------+----------+--------------+  Dist forearm                               not visualized  +-----------------+-------------+----------+--------------+  Wrist                                      not visualized  +-----------------+-------------+----------+--------------+ +-----------------+-------------+----------+--------------+  Right Basilic     Diameter (cm) Depth (cm)    Findings     +-----------------+-------------+----------+--------------+  Prox upper arm        0.25                                 +-----------------+-------------+----------+--------------+  Mid upper arm         0.21                                 +-----------------+-------------+----------+--------------+  Dist upper arm                             not visualized  +-----------------+-------------+----------+--------------+  Antecubital fossa                          not visualized  +-----------------+-------------+----------+--------------+  Prox forearm                               not visualized  +-----------------+-------------+----------+--------------+  Mid forearm                                not visualized  +-----------------+-------------+----------+--------------+  Distal forearm                             not visualized  +-----------------+-------------+----------+--------------+  Wrist                                      not visualized  +-----------------+-------------+----------+--------------+ +-----------------+-------------+----------+--------------+  Left Cephalic     Diameter (cm) Depth (cm)    Findings     +-----------------+-------------+----------+--------------+  Shoulder              0.09                                 +-----------------+-------------+----------+--------------+  Prox upper arm        0.05                                 +-----------------+-------------+----------+--------------+  Mid upper arm         0.11                                  +-----------------+-------------+----------+--------------+  Dist upper arm        0.11  thrombus     +-----------------+-------------+----------+--------------+  Antecubital fossa     0.11                    thrombus     +-----------------+-------------+----------+--------------+  Prox forearm          0.29                    thrombus     +-----------------+-------------+----------+--------------+  Mid forearm           0.22                                 +-----------------+-------------+----------+--------------+  Dist forearm                               not visualized  +-----------------+-------------+----------+--------------+  Wrist                                      not visualized  +-----------------+-------------+----------+--------------+ +-----------------+-------------+----------+--------------+  Left Basilic      Diameter (cm) Depth (cm)    Findings     +-----------------+-------------+----------+--------------+  Mid upper arm         0.26                                 +-----------------+-------------+----------+--------------+  Dist upper arm        0.21                                 +-----------------+-------------+----------+--------------+  Antecubital fossa     0.30                                 +-----------------+-------------+----------+--------------+  Prox forearm                               not visualized  +-----------------+-------------+----------+--------------+  Mid forearm                                not visualized  +-----------------+-------------+----------+--------------+  Distal forearm                             not visualized  +-----------------+-------------+----------+--------------+  Wrist                                      not visualized  +-----------------+-------------+----------+--------------+ *See table(s) above for measurements and observations.  Diagnosing physician: Orlie Pollen Electronically signed by Orlie Pollen on 01/28/2022 at  5:52:04 PM.    Final    Medications: Infusions:  sodium chloride      Scheduled Medications:  vitamin C  250 mg Oral Daily   aspirin EC  81 mg Oral Daily   atorvastatin  80 mg Oral Daily   calcitRIOL  0.25 mcg Oral Daily   calcium acetate  667 mg Oral TID WC   Chlorhexidine Gluconate  Cloth  6 each Topical Q0600   clopidogrel  75 mg Oral Q breakfast   darbepoetin (ARANESP) injection - DIALYSIS  150 mcg Intravenous Q Thu-HD   feeding supplement (GLUCERNA SHAKE)  237 mL Oral BID BM   fentaNYL       hydrALAZINE  100 mg Oral Q8H   insulin aspart  0-6 Units Subcutaneous TID WC   metoCLOPramide (REGLAN) injection  5 mg Intravenous TID WC & HS   metoprolol tartrate  50 mg Oral BID   multivitamin  1 tablet Oral QHS   pantoprazole  40 mg Oral Daily   sodium chloride flush  3 mL Intravenous Q12H   sodium chloride flush  3 mL Intravenous Q12H    have reviewed scheduled and prn medications.  Physical Exam: General: Lying in bed, no distress Heart: Normal rate Lungs: Bilateral chest rise with no increased work of breathing Abdomen: soft, non tender Extremities: No edema, warm and well perfused neuro: Awake, oriented to person and place    01/29/2022,12:24 PM  LOS: 14 days

## 2022-01-29 NOTE — Anesthesia Postprocedure Evaluation (Signed)
Anesthesia Post Note  Patient: [name]  Procedure(s) Performed: LEFT ARM ARTERIOVENOUS (AV) FISTULA WITH PLACEMENT OF GORE-TEX GRAFT (4-7MM STRETCH VASCULAR GRAFT) (Left: Arm Lower) INSERTION OF PALINDROME 23CM (PRECISION CHRONIC CATHETER KIT) DIALYSIS CATHETER (Right: Neck) ULTRASOUND GUIDANCE FOR VASCULAR ACCESS     Patient location during evaluation: PACU Anesthesia Type: General Level of consciousness: awake and alert Pain management: pain level controlled Vital Signs Assessment: post-procedure vital signs reviewed and stable Respiratory status: spontaneous breathing, nonlabored ventilation and respiratory function stable Cardiovascular status: blood pressure returned to baseline and stable Postop Assessment: no apparent nausea or vomiting Anesthetic complications: no   No notable events documented.  Last Vitals:  Vitals:   01/29/22 1208 01/29/22 1325  BP: (!) 113/48 98/67  Pulse: 69 62  Resp: 20   Temp:  (!) 36.2 C  SpO2: 92% 93%    Last Pain:  Vitals:   01/29/22 1325  TempSrc: Oral  PainSc:                  Santa Lighter

## 2022-01-29 NOTE — Op Note (Signed)
NAME: Monique Cabrera    MRN: [id number] DOB: October 21, 1935    DATE OF OPERATION: 01/29/2022  PREOP DIAGNOSIS:    End-stage renal disease  POSTOP DIAGNOSIS:    Same  PROCEDURE:    Placement of right IJ tunneled dialysis catheter Placement of new left upper arm AV graft (4-7 mm PTFE graft)  SURGEON: Judeth Cornfield. Scot Dock, MD  ASSIST: Ardyth Man, RNFA  ANESTHESIA: General  EBL: Minimal  INDICATIONS:    Monique Cabrera is a 87 y.o. female who just began dialysis.  She had a temporary right IJ tunneled dialysis catheter and needed permanent access.  Of note her preoperative arterial Doppler study showed evidence of upper extremity arterial occlusive disease bilaterally so she was at increased risk for steal.  Her veins were small and she was not a candidate for a fistula.  FINDINGS:   The brachial vein at the antecubital level was small and therefore I placed an upper arm graft.  TECHNIQUE:   The patient was taken to the operating room and received a general anesthetic.  The neck and upper chest were prepped and draped in the usual sterile fashion.  I elected to change the catheter that she had over a wire.  The cath was clamped and divided.  The exit site for a 23 sonometer tunneled dialysis catheter was selected and the catheter brought through the tunnel.  I then passed a wire through the old temporary catheter and then the dilator and peel-away sheath were advanced over the wire after the old catheter was removed.  The peel-away sheath and wire were removed.  The catheter was passed to the peel-away sheath and positioned at the cavoatrial junction.  Both ports withdrew easily were then flushed with heparinized saline.  The catheter was secured at its exit site with a 3-0 nylon suture.  The IJ cannulation site was closed with a 4-0 Monocryl.  Sterile dressing was applied.  Attention was then turned to the left arm.  The left arm was prepped and draped in usual sterile fashion.   A longitudinal incision was made just above the antecubital level.  Here the brachial artery and adjacent brachial vein were dissected free.  The vein was small and I therefore elected to place an upper arm graft.  Separate longitudinal incision was made beneath the axilla.  A 4-7 mm PTFE graft was then tunneled in the upper arm and the patient was heparinized.  The brachial artery was clamped proximally and distally and a longitudinal arteriotomy was made.  Of note the artery did not have significant plaque at this level.  It was a 4 mm artery.  Only a short segment of the 4 mm into the graft was excised, the graft was spatulated and sewn end-to-side to the brachial artery using continuous 6-0 Prolene suture.  The graft was imported the appropriate length for anastomosis to the high brachial vein.  The vein was ligated distally and spatulated proximally.  The graft was cut to the appropriate length spatulated and sewn into into the vein using continuous 6-0 Prolene suture.  At the completion there was an excellent thrill in the graft and a good radial signal with the Doppler.  Hemostasis was obtained in the wounds.  The wounds were then each closed with 2 deep layers of 3-0 Vicryl and the skin closed with 4-0 Monocryl.  Dermabond was applied.  The patient tolerated the procedure well and was transferred to the recovery room in stable condition.  All needle and sponge counts were correct.  Given the complexity of the case a first assistant was necessary in order to expedient the procedure and safely perform the technical aspects of the operation.  Monique Mayo, MD, FACS [facility]  DATE OF DICTATION:   01/29/2022

## 2022-01-29 NOTE — Progress Notes (Signed)
Transferred to OR. 2 rings and 2 ear rings placed on patient belongings at bedside with Neoma Laming, RN.

## 2022-01-29 NOTE — Discharge Summary (Signed)
Physician Discharge Summary   Patient: Monique Cabrera MRN: [id number] DOB: Jul 11, 1935  Admit date:     01/15/2022  Discharge date: 01/29/22  Discharge Physician: Shaunda Tipping J [place]   PCP: Wardell Honour, MD   Recommendations at discharge:   Follow-up with PCP in 7-10 days following hospitalization Follow-up with cardiology, Coletta Memos, NP on 02/08/2022 at 8:25 AM Follow-up with vascular surgery, Dr. Scot Dock as scheduled Starting hemodialysis at [name] clinic on Tuesday/Thursday/Saturday schedule, first session on 01/30/2022 arrive at 10:25 AM for 11:25 AM chair time  Discharge Diagnoses: Principal Problem:   Malignant hypertension Active Problems:   Acute on chronic diastolic CHF (congestive heart failure) (HCC)   Chronic kidney disease (CKD), stage V (HCC)   Type 2 diabetes mellitus with renal complication (HCC)   Non-ST elevation (NSTEMI) myocardial infarction ([place])   Anemia of chronic renal failure   Malnutrition of moderate degree   Hyperlipidemia   Status post below-knee amputation of right lower extremity ([place])   Physical deconditioning   DNR (do not resuscitate)/DNI(Do Not Intubate)  Resolved Problems:   * No resolved hospital problems. *   Hospital Course: Monique Cabrera is a 87 year old female with past medical history significant for chronic diastolic congestive heart failure, CKD stage V, type 2 diabetes mellitus, essential hypertension, s/p right BKA who presented to [name] ED on 1/20 with progressive shortness of breath and chest pain over the past 3 months.  Denies nausea/vomiting.  In the ED, patient was noted to be hypertensive with SBP greater than 200.  Sodium 137, potassium 4.2, chloride 104, CO2 18, glucose 115, BUN 51, creatinine 7.50, AST 23, ALT 17, total bilirubin is at 1.0.  WBC 8.7, hemoglobin 11.0, platelets 229.  BNP greater than 4500.  High sensitive troponin 191>244.  Chest x-ray with cardiomegaly, mild interstitial edema, increase in moderate  bilateral pleural effusions.  Cardiology consulted.  TRH consulted for further evaluation management of malignant hypertension, elevated troponin, CHF exacerbation and progressive renal failure.   Assessment and Plan: * Malignant hypertension- (present on admission) Upon presentation to the ED, SBP's noted to be in the 200s.  Likely secondary to worsening renal failure, volume overload.  Patient was started on hemodialysis.  Continue metoprolol tartrate 50 mg p.o. twice daily, hydralazine 100 mg p.o. 3 times daily.  Outpatient follow-up with PCP.  Acute on chronic diastolic CHF (congestive heart failure) ([place])- (present on admission) Patient presenting with progressive shortness of breath, noted elevated BNP greater than 4500 with chest x-ray findings of vascular congestion and worsening pleural effusions.  Likely secondary to worsening renal failure; now started on dialysis. Continue volume management per HD  Chronic kidney disease (CKD), stage V ([name])- (present on admission) Patient with Hx of CKD stage 5, follows with [facility].  Per last office notes in December 2022, patient declined renal replacement therapy.  She wanted to avoid hospitalizations.  Patient with creatinine elevated 7.50 with elevated BUN; now agreeing for hemodialysis.  Initially a temporary HD catheter was placed by interventional radiology.  The patient was started on hemodialysis while inpatient.  Vascular surgery was consulted and patient underwent placement of a right IJ tunneled HD catheter and placement of a new left upper arm AV graft by Dr. Scot Dock on 01/29/2022. Continue HD on TTS schedule; outpatient seat obtained at [facility] clinic; first session will be on 01/30/2022.   Type 2 diabetes mellitus with renal complication ([place])- (present on admission) Hemoglobin A1c 5.7, well controlled.  Home medications include Lantus 20  units [place] daily, Humalog 3 units TIDAC.   Non-ST elevation (NSTEMI) myocardial infarction  [facility])- (present on admission) High sensitive troponin elevated 191 on admission and peaked at 6368.  TTE with LVEF 40-45%, LAD territory hypokinesis.  Cardiology was consulted and patient underwent left heart catheterization 1/25 with proximal LAD-mid LAD lesion 50% stenosed, first marginal 80 sent stenosed, RPDA 100% stenosed, ostial RCA 100% stenosed.  Cardiology recommended GMDT with medical management.  Continue aspirin, Plavix, metoprolol tartrate 50 mg p.o. twice daily, atorvastatin 80 mg p.o. daily.  Outpatient follow-up scheduled with cardiology.   Anemia of chronic renal failure- (present on admission) Hemoglobin 8.4 at time of discharge, stable.  Malnutrition of moderate degree- (present on admission) Body mass index is 22.2 kg/m.  Nutrition Status: Nutrition Problem: Moderate Malnutrition (in the context of chronic illness) Etiology: decreased appetite Signs/Symptoms: mild fat depletion, moderate muscle depletion Interventions: Refer to RD note for recommendations Dietitian following, encourage increase oral intake, supplementation    Elevated troponin Cardiology was consulted and followed due to patient's elevated troponin.  EKG without acute ST-T changes.  TTE with LVEF 40 to 45%, LV mildly decreased function, no LV regional wall motion normalities, mild asymmetric LVH, trivial MR, no aortic stenosis.  Outpatient follow-up scheduled with cardiology.   Status post below-knee amputation of right lower extremity ([place])- (present on admission) Chronic.  Hyperlipidemia- (present on admission) Atorvastatin 80 mg p.o. daily  Physical deconditioning- (present on admission) PT/OT currently recommending home health. --Continue therapy efforts while inpatient  DNR (do not resuscitate)/DNI(Do Not Intubate)- (present on admission) Has a DNR form that was signed in April 2021.     Pleural effusion, bilateral Oxygenating well on room air.  Continue volume management with  hemodialysis.           Consultants: Cardiology, nephrology, vascular surgery Procedures performed: Temporary dialysis catheter placement, tunneled HD catheter placement, AV graft placement Disposition: Home health Diet recommendation:  Discharge Diet Orders (From admission, onward)     Start     Ordered   01/29/22 0000  Diet - low sodium heart healthy        01/29/22 1224           Renal diet  DISCHARGE MEDICATION: Allergies as of 01/29/2022       Reactions   Invokana [canagliflozin] Itching, Swelling, Other (See Comments)   Vaginal itching, swelling and irritation   Jardiance [empagliflozin] Itching, Swelling, Other (See Comments)   Vaginal itching and swelling   Tuberculin Ppd Other (See Comments)   Per records from [facility]         Medication List     STOP taking these medications    amLODipine 10 MG tablet Commonly known as: NORVASC   carvedilol 25 MG tablet Commonly known as: COREG   cloNIDine 0.2 MG tablet Commonly known as: CATAPRES   olmesartan 20 MG tablet Commonly known as: BENICAR   sodium bicarbonate 650 MG tablet   torsemide 20 MG tablet Commonly known as: DEMADEX       TAKE these medications    acetaminophen 500 MG tablet Commonly known as: TYLENOL Take 1,000 mg by mouth every 6 (six) hours as needed for moderate pain or headache.   acetaminophen 650 MG CR tablet Commonly known as: TYLENOL Take 1,300 mg by mouth every 8 (eight) hours as needed for pain.   Aspirin Low Dose 81 MG EC tablet Generic drug: aspirin Take 81 mg by mouth daily.   atorvastatin 80 MG tablet Commonly known  as: LIPITOR Take 1 tablet (80 mg total) by mouth daily. Start taking on: January 30, 2022 What changed:  medication strength how much to take   B-D ULTRAFINE III SHORT PEN 31G X 8 MM Misc Generic drug: Insulin Pen Needle Use to check blood sugar every day. Dx: 11.29; 11.65   calcitRIOL 0.25 MCG capsule Commonly known as:  ROCALTROL Take 0.25 mcg by mouth daily.   calcium acetate 667 MG capsule Commonly known as: PHOSLO Take 1 capsule (667 mg total) by mouth 3 (three) times daily with meals.   clopidogrel 75 MG tablet Commonly known as: PLAVIX Take 1 tablet (75 mg total) by mouth daily.   feeding supplement (GLUCERNA SHAKE) Liqd Take 237 mLs by mouth as needed (nutritional support). strawberry   glucose 5 g chewable tablet Chew 15 g by mouth as needed for low blood sugar. What changed: Another medication with the same name was removed. Continue taking this medication, and follow the directions you see here.   hydrALAZINE 100 MG tablet Commonly known as: APRESOLINE Take 1 tablet (100 mg total) by mouth every 8 (eight) hours.   insulin lispro 100 UNIT/ML injection Commonly known as: HumaLOG Inject 0.03 mLs (3 Units total) into the skin 3 (three) times daily after meals. What changed:  when to take this additional instructions   Lantus SoloStar 100 UNIT/ML Solostar Pen Generic drug: insulin glargine Inject 20 Units into the skin daily.   metoprolol tartrate 50 MG tablet Commonly known as: LOPRESSOR Take 1 tablet (50 mg total) by mouth 2 (two) times daily.   multivitamin Tabs tablet Take 1 tablet by mouth at bedtime.   multivitamin with minerals Tabs tablet Take 1 tablet by mouth daily.   OneTouch Verio test strip Generic drug: glucose blood USE TO TEST BLOOD SUGAR THREE TIMES DAILY. DX: E11.9 What changed:  when to take this additional instructions   pantoprazole 40 MG tablet Commonly known as: PROTONIX Take 1 tablet (40 mg total) by mouth daily. Start taking on: January 30, 2022        Follow-up Information     Minus Breeding, MD Follow up on 02/08/2022.   Specialty: Cardiology Why: at 8:25 AM with his nurse practitioner Coletta Memos Contact information: [address] [place] [address] [place], [facility] Follow up.    Why: Schedule is Tuesday,Thursday, Saturday.  For first appointment, arrive at 10:25 to complete paperwork for 11:25 chair time. Contact information: [place] [address] [place], [facility] Follow up.   Specialty: Home Health Services Why: Home Health Physical and Occupational Therapy-office to call with a visit time. Contact information: [place] [address] [place] [address] [phone]         Angelia Mould, MD. Schedule an appointment as soon as possible for a visit.   Specialties: Vascular Surgery, Cardiology Contact information: [address] [place] [address] [phone]                 Discharge Exam: Filed Weights   01/27/22 0423 01/28/22 0457 01/29/22 0447  Weight: 60.5 kg 57.2 kg 55.8 kg   Physical Exam GEN: 87 yo female in NAD, alert and oriented x 3, elderly in appearance HEENT: NCAT, PERRL, EOMI, sclera clear, MMM PULM: CTAB w/o wheezes/crackles, normal respiratory effort, on room air CV: RRR w/o M/G/R GI: abd soft, NTND, NABS, no  R/G/M MSK: no peripheral edema, right BKA noted, moves all extremities independently NEURO: CN II-XII intact, no focal deficits, sensation to light touch intact PSYCH: normal mood/affect Integumentary: dry/intact, no rashes or wounds   Condition at discharge: stable  The results of significant diagnostics from this hospitalization (including imaging, microbiology, ancillary and laboratory) are listed below for reference.   Imaging Studies: CT ABDOMEN PELVIS WO CONTRAST  Result Date: 01/17/2022 CLINICAL DATA:  Nausea and vomiting. EXAM: CT ABDOMEN AND PELVIS WITHOUT CONTRAST TECHNIQUE: Multidetector CT imaging of the abdomen and pelvis was performed following the standard protocol without IV contrast. RADIATION DOSE REDUCTION: This exam was performed according to the departmental dose-optimization program which includes automated exposure control,  adjustment of the mA and/or kV according to patient size and/or use of iterative reconstruction technique. COMPARISON:  CT abdomen pelvis dated 08/21/2012. FINDINGS: Evaluation of this exam is limited in the absence of intravenous contrast as well as respiratory motion. Lower chest: Partially visualized moderate to large bilateral pleural effusions with large areas of consolidative changes of the lower lobes. Advanced 3 vessel coronary vascular calcification. No intra-abdominal free air.  Small free fluid in the pelvis. Hepatobiliary: Small right liver hypodense lesions are not characterized on this noncontrast CT. No intrahepatic biliary dilatation. There is layering sludge or small stones within the gallbladder. Pancreas: The pancreas is poorly visualized and suboptimally evaluated. Spleen: Normal in size without focal abnormality. Adrenals/Urinary Tract: The adrenal glands are grossly unremarkable. Moderate bilateral renal parenchyma atrophy. Indeterminate 2.5 cm hypodense lesion in the upper pole of the right kidney, likely a cyst. There is no hydronephrosis on either side. The urinary bladder is partially distended and grossly unremarkable. Stomach/Bowel: There is no bowel obstruction or active inflammation. No evidence of acute appendicitis. Vascular/Lymphatic: Advanced aortoiliac atherosclerotic disease as well as advanced mesenteric vascular calcification. The IVC is grossly unremarkable. No portal venous gas. There is no adenopathy. Reproductive: Hysterectomy. Other: There is diffuse subcutaneous edema and anasarca. Musculoskeletal: Osteopenia with degenerative changes of the spine. No acute osseous pathology. IMPRESSION: 1. No acute intra-abdominal or pelvic pathology. No bowel obstruction. 2. Cholelithiasis. 3. Partially visualized moderate to large bilateral pleural effusions with large areas of consolidative changes of the lower lobes. 4. Aortic Atherosclerosis (ICD10-I70.0). Electronically Signed   By:  Anner Crete M.D.   On: 01/17/2022 22:14   DG Chest 2 View  Result Date: 01/15/2022 CLINICAL DATA:  Shortness of breath EXAM: CHEST - 2 VIEW COMPARISON:  12/30/2021 FINDINGS: Transverse diameter of heart is increased. There is interval increase in interstitial markings in the parahilar regions and lower lung fields. There is interval increase in bilateral pleural effusions. Evaluation of lower lung fields for infiltrates is limited by bilateral pleural effusions. There is no pneumothorax. IMPRESSION: Cardiomegaly. Prominence of interstitial markings in the parahilar regions suggest mild interstitial edema. There is interval increase in small to moderate bilateral pleural effusions. Evaluation of lower lung fields for infiltrates is limited by pleural effusions. Electronically Signed   By: Elmer Picker M.D.   On: 01/15/2022 13:19   DG Chest 2 View  Result Date: 12/30/2021 CLINICAL DATA:  Cough and dyspnea with exertion. EXAM: CHEST - 2 VIEW COMPARISON:  Chest x-ray 09/11/2019. FINDINGS: There are small bilateral pleural effusions. There are patchy airspace opacities in both lung bases. The heart is enlarged. There central pulmonary vascular congestion. The aorta is tortuous with atherosclerotic calcifications. No acute fractures are seen. IMPRESSION: 1. Cardiomegaly with central pulmonary vascular congestion and small effusions. 2. Bibasilar  atelectasis/airspace disease. Electronically Signed   By: Ronney Asters M.D.   On: 12/30/2021 18:00   CARDIAC CATHETERIZATION  Result Date: 01/20/2022   Prox LAD to Mid LAD lesion is 50% stenosed.   1st Mrg lesion is 80% stenosed.   RPDA lesion is 100% stenosed.   Ost RCA lesion is 100% stenosed.   LV end diastolic pressure is moderately elevated. There is evidence for severe multivessel coronary calcification and vessel tortuosity. The LAD has proximal 50% stenosis.  The left circumflex vessel gives rise to a proximal marginal vessel which has focal 80%  stenosis on a bend in the vessel; the RCA is totally occluded proximally.  There is extensive left to right collateralization filling the entire RCA up to the near ostial total occlusion. Elevated LVEDP at 26 mm. RECOMMENDATION: Patient is to initiate dialysis today.  Commend optimal blood pressure control and lipid management.  The patient has been on DAPT with aspirin/Plavix and would continue as long as no significant bleeding.   IR Fluoro Guide CV Line Right  Result Date: 01/21/2022 CLINICAL DATA:  Nonfunctional right jugular temporary non tunneled dialysis catheter placed yesterday. EXAM: EXCHANGE OF NON-TUNNELED CENTRAL VENOUS HEMODIALYSIS CATHETER UNDER FLUOROSCOPIC GUIDANCE ANESTHESIA/SEDATION: None MEDICATIONS: None FLUOROSCOPY TIME:  18 seconds.  7.0 mGy. PROCEDURE: The procedure, risks, benefits, and alternatives were explained to the patient. Questions regarding the procedure were encouraged and answered. The patient understands and consents to the procedure. A time-out was performed prior to initiating the procedure. The preexisting dialysis catheter and surrounding skin were prepped with chlorhexidine in a sterile fashion, and a sterile drape was applied covering the operative field. Maximum barrier sterile technique with sterile gowns and gloves were used for the procedure. Local anesthesia was provided with 1% lidocaine. The pre-existing dialysis catheter was inspected under fluoroscopy. A retention suture at the skin exit site was cut. The preexisting dialysis catheter was removed over a guidewire. A new 32 French, triple lumen [place]kar non tunneled hemodialysis catheter measuring 16 cm was advanced over the guidewire under fluoroscopy. Final catheter positioning was confirmed and documented with a fluoroscopic spot image. The catheter was aspirated, flushed with saline, and injected with appropriate volume heparin dwells. A Prolene retention suture was applied at the catheter exit site.  COMPLICATIONS: None.  No pneumothorax. FINDINGS: Initial fluoroscopy demonstrates a significant kink in the catheter near the entrance site to the right internal jugular vein. This was related to how the catheter was sutured at the skin at the exit site laterally. After the retention suture was released, the catheter straightened, releasing the kink under fluoroscopy. Given that this catheter was likely occluded today during hemodialysis, a new catheter was exchanged over a guidewire through the pre-existing venous access. All 3 lumens of the new catheter aspirate and flush normally and the catheter is ready for immediate use. IMPRESSION: Exchange of kinked non tunneled hemodialysis catheter via the right internal jugular vein. A new 16 cm catheter was placed and positioned such that it is not kinked after placement by fluoroscopy. The catheter is ready for immediate use. Electronically Signed   By: Aletta Edouard M.D.   On: 01/21/2022 16:21   IR Fluoro Guide CV Line Right  Result Date: 01/20/2022 INDICATION: Renal failure EXAM: Ultrasound fluoroscopic guided temporary dialysis catheter placement MEDICATIONS: None ANESTHESIA/SEDATION: None FLUOROSCOPY TIME:  Fluoroscopy Time: 0 minutes 24 seconds (1 mGy). COMPLICATIONS: None immediate. PROCEDURE: Informed written consent was obtained from the patient after a thorough discussion of the procedural risks, benefits  and alternatives. All questions were addressed. Maximal Sterile Barrier Technique was utilized including caps, mask, sterile gowns, sterile gloves, sterile drape, hand hygiene and skin antiseptic. A timeout was performed prior to the initiation of the procedure. Right neck prepped and draped in the usual sterile fashion. All elements of maximal sterile barrier were utilized including, cap, mask, sterile gown, sterile gloves, large sterile drape, hand scrubbing and 2% Chlorhexidine for skin cleaning. The right internal jugular vein was evaluated with  ultrasound and shown to be patent. A permanent ultrasound image was obtained and placed in the patient's medical record. Using sterile gel and a sterile probe cover, the right internal jugular vein was entered with a 21 ga needle during real time ultrasound guidance. 0.018 inch guidewire placed and 21 ga needle exchanged for transitional dilator set. Utilizing fluoroscopy, 0.035 inch guidewire advanced through the needle without difficulty. Serial dilation performed, and catheter inserted over the guidewire. The tip was positioned in the right atrium. All lumens of the catheter aspirated and flushed well. The dialysis lumens were locked with Heparin. The catheter was secured to the skin with suture. The insertion site was covered with sterile dressing. IMPRESSION: Temporary 15 cm right IJ hemodialysis catheter ready for use. Electronically Signed   By: Miachel Roux M.D.   On: 01/20/2022 09:16   IR Fluoro Guide CV Line Right  Result Date: 01/19/2022 INDICATION: 87 year old female referred for hemodialysis catheter EXAM: IMAGE GUIDED TEMPORARY HEMODIALYSIS CATHETER PLACEMENT MEDICATIONS: None ANESTHESIA/SEDATION: Moderate (conscious) sedation was not employed during this procedure. Total intra-service moderate Sedation Time: 0 minutes. The patient's level of consciousness and vital signs were monitored continuously by radiology nursing throughout the procedure under my direct supervision. FLUOROSCOPY TIME:  Fluoroscopy Time: 0 minutes 42 seconds (1 mGy). COMPLICATIONS: None PROCEDURE: Informed written consent was obtained from the patient and the patient's family after a thorough discussion of the procedural risks, benefits and alternatives. All questions were addressed. A timeout was performed prior to the initiation of the procedure. The right neck and chest was prepped with chlorhexidine, and draped in the usual sterile fashion using maximum barrier technique (cap and mask, sterile gown, sterile gloves, large  sterile sheet, hand hygiene and cutaneous antiseptic). Local anesthesia was attained by infiltration with 1% lidocaine without epinephrine. Ultrasound demonstrated patency of the right internal jugular vein, and this was documented with an image. Under real-time ultrasound guidance, this vein was accessed with a 21 gauge micropuncture needle and image documentation was performed. A small dermatotomy was made at the access site with an 11 scalpel. A 0.018" wire was advanced into the SVC and the access needle exchanged for a 66F micropuncture vascular sheath. The 0.018" wire was then removed and a 0.035" wire advanced into the IVC. Upon withdrawal of the 018 wire, the wire was marked for appropriate length of the internal portion of the catheter. A 15 cm catheter was selected. Skin and subcutaneous tissues were serially dilated. Catheter was placed on the wire. The catheter tip is positioned in the upper right atrium. This was documented with a spot image. Both ports of the hemodialysis catheter were then tested for excellent function. The ports were then locked with heparinized lock. Patient tolerated the procedure well and remained hemodynamically stable throughout. No complications were encountered and no significant blood loss was encountered. IMPRESSION: Status post right IJ temporary hemodialysis catheter placement. Signed, Dulcy Fanny. Dellia Nims, [facility] Electronically Signed   By: Corrie Mckusick D.O.  On: 01/19/2022 10:08   IR US Guide Vasc Access Right  Result Date: 01/20/2022 INDICATION: Renal failure EXAM: Ultrasound fluoroscopic guided temporary dialysis catheter placement MEDICATIONS: None ANESTHESIA/SEDATION: None FLUOROSCOPY TIME:  Fluoroscopy Time: 0 minutes 24 seconds (1 mGy). COMPLICATIONS: None immediate. PROCEDURE: Informed written consent was obtained from the patient after a thorough discussion of the procedural risks, benefits  and alternatives. All questions were addressed. Maximal Sterile Barrier Technique was utilized including caps, mask, sterile gowns, sterile gloves, sterile drape, hand hygiene and skin antiseptic. A timeout was performed prior to the initiation of the procedure. Right neck prepped and draped in the usual sterile fashion. All elements of maximal sterile barrier were utilized including, cap, mask, sterile gown, sterile gloves, large sterile drape, hand scrubbing and 2% Chlorhexidine for skin cleaning. The right internal jugular vein was evaluated with ultrasound and shown to be patent. A permanent ultrasound image was obtained and placed in the patient's medical record. Using sterile gel and a sterile probe cover, the right internal jugular vein was entered with a 21 ga needle during real time ultrasound guidance. 0.018 inch guidewire placed and 21 ga needle exchanged for transitional dilator set. Utilizing fluoroscopy, 0.035 inch guidewire advanced through the needle without difficulty. Serial dilation performed, and catheter inserted over the guidewire. The tip was positioned in the right atrium. All lumens of the catheter aspirated and flushed well. The dialysis lumens were locked with Heparin. The catheter was secured to the skin with suture. The insertion site was covered with sterile dressing. IMPRESSION: Temporary 15 cm right IJ hemodialysis catheter ready for use. Electronically Signed   By: Miachel Roux M.D.   On: 01/20/2022 09:16   IR US Guide Vasc Access Right  Result Date: 01/19/2022 INDICATION: 87 year old female referred for hemodialysis catheter EXAM: IMAGE GUIDED TEMPORARY HEMODIALYSIS CATHETER PLACEMENT MEDICATIONS: None ANESTHESIA/SEDATION: Moderate (conscious) sedation was not employed during this procedure. Total intra-service moderate Sedation Time: 0 minutes. The patient's level of consciousness and vital signs were monitored continuously by radiology nursing throughout the procedure under my  direct supervision. FLUOROSCOPY TIME:  Fluoroscopy Time: 0 minutes 42 seconds (1 mGy). COMPLICATIONS: None PROCEDURE: Informed written consent was obtained from the patient and the patient's family after a thorough discussion of the procedural risks, benefits and alternatives. All questions were addressed. A timeout was performed prior to the initiation of the procedure. The right neck and chest was prepped with chlorhexidine, and draped in the usual sterile fashion using maximum barrier technique (cap and mask, sterile gown, sterile gloves, large sterile sheet, hand hygiene and cutaneous antiseptic). Local anesthesia was attained by infiltration with 1% lidocaine without epinephrine. Ultrasound demonstrated patency of the right internal jugular vein, and this was documented with an image. Under real-time ultrasound guidance, this vein was accessed with a 21 gauge micropuncture needle and image documentation was performed. A small dermatotomy was made at the access site with an 11 scalpel. A 0.018" wire was advanced into the SVC and the access needle exchanged for a 30F micropuncture vascular sheath. The 0.018" wire was then removed and a 0.035" wire advanced into the IVC. Upon withdrawal of the 018 wire, the wire was marked for appropriate length of the internal portion of the catheter. A 15 cm catheter was selected. Skin and subcutaneous tissues were serially dilated. Catheter was placed on the wire. The catheter tip is positioned in the upper right atrium. This was documented with a spot image. Both ports of the hemodialysis catheter were then tested for excellent  function. The ports were then locked with heparinized lock. Patient tolerated the procedure well and remained hemodynamically stable throughout. No complications were encountered and no significant blood loss was encountered. IMPRESSION: Status post right IJ temporary hemodialysis catheter placement. Signed, Dulcy Fanny. Dellia Nims, RPVI [facility] Radiology Electronically Signed   By: Corrie Mckusick D.O.   On: 01/19/2022 10:08   DG Chest Port 1 View  Result Date: 01/29/2022 CLINICAL DATA:  Hemodialysis catheter placement EXAM: PORTABLE CHEST 1 VIEW COMPARISON:  Chest radiograph 01/15/2022 FINDINGS: There is a new tunneled right-sided catheter terminating in the lower SVC/cavoatrial junction. The heart is enlarged, unchanged. The upper mediastinal contours are stable, with dense calcified atherosclerotic plaque of the thoracic aorta. There are bilateral pleural effusions with adjacent airspace opacity likely reflecting atelectasis. Since the study from 01/15/2022, aeration of the lower lobes is slightly improved, more so on the right. The upper lungs well aerated. There is no pneumothorax. There is no acute osseous abnormality. IMPRESSION: 1. Tunneled right-sided vascular catheter in place without evidence of complication. No pneumothorax. 2. Bilateral pleural effusions with adjacent airspace disease likely reflecting atelectasis, with slightly improved aeration particularly on the right since 01/15/2022 Electronically Signed   By: Valetta Mole M.D.   On: 01/29/2022 11:04   DG Abd Portable 1V  Result Date: 01/16/2022 CLINICAL DATA:  Nausea and vomiting. EXAM: PORTABLE ABDOMEN - 1 VIEW COMPARISON:  02/09/2020 FINDINGS: 1459 hours. No gaseous bowel dilatation to suggest obstruction. Dense wall calcification noted in the splenic artery, abdominal aorta and iliac arteries bilaterally. IMPRESSION: 1. Nonobstructive bowel gas pattern. 2. Dense wall calcification in the splenic artery and iliac arteries. 3.  Aortic Atherosclerois (ICD10-170.0) Electronically Signed   By: Misty Stanley M.D.   On: 01/16/2022 15:18   DG C-Arm 1-60 Min-No Report  Result Date: 01/29/2022 Fluoroscopy was utilized by the requesting physician.  No radiographic interpretation.   VAS [place] UPPER EXTREMITY ARTERIAL DUPLEX  Result Date:  01/27/2022  UPPER EXTREMITY DUPLEX STUDY Patient Name:  Monique Cabrera  Date of Exam:   01/27/2022 Medical Rec #: [id number]         Accession #:    [id number] Date of Birth: 01-15-1935         Patient Gender: F Patient Age:   18 years Exam Location:  [facility] Procedure:      VAS [place] UPPER EXTREMITY ARTERIAL DUPLEX Referring Phys: Harrell Gave DICKSON --------------------------------------------------------------------------------  Indications: Pre-op AVF.  Risk Factors:  Hypertension, hyperlipidemia, Diabetes, past history of smoking,                prior MI. Other Factors: CHF, PAD, CKD5 Comparison Study: No previous exams Performing Technologist: Jody Hill RVT, RDMS  Examination Guidelines: A complete evaluation includes B-mode imaging, spectral Doppler, color Doppler, and power Doppler as needed of all accessible portions of each vessel. Bilateral testing is considered an integral part of a complete examination. Limited examinations for reoccurring indications may be performed as noted.  Right Doppler Findings: +-------------+----------+--------+--------+--------+  Site          PSV (cm/s) Waveform Stenosis Comments  +-------------+----------+--------+--------+--------+  Brachial Dist 44                                     +-------------+----------+--------+--------+--------+  Radial Prox   60                                     +-------------+----------+--------+--------+--------+  Radial Mid    47                                     +-------------+----------+--------+--------+--------+  Radial Dist   38                                     +-------------+----------+--------+--------+--------+  Ulnar Prox    21                                     +-------------+----------+--------+--------+--------+  Ulnar Mid     23                                     +-------------+----------+--------+--------+--------+   Right Pre-Dialysis Findings:  +----------------------+---------+-------------------+----------+--------------+  Location               PSV       Intralum. Diam.     Waveform   Comments                                (cm/s)    (cm)                                           +----------------------+---------+-------------------+----------+--------------+  Brachial Antecub.      27        0.43                biphasic                    fossa                                                                           +----------------------+---------+-------------------+----------+--------------+  Radial Art at Wrist    38        0.14                monophasic calcific                                                                         plaque          +----------------------+---------+-------------------+----------+--------------+  Ulnar Art at Wrist     0                             absent     calcific  plaque          +----------------------+---------+-------------------+----------+--------------+   Left Doppler Findings: +-------------+----------+--------+--------+--------+  Site          PSV (cm/s) Waveform Stenosis Comments  +-------------+----------+--------+--------+--------+  Brachial Dist 37                                     +-------------+----------+--------+--------+--------+  Radial Prox   25                                     +-------------+----------+--------+--------+--------+  Radial Mid    47                                     +-------------+----------+--------+--------+--------+  Radial Dist   30                                     +-------------+----------+--------+--------+--------+  Ulnar Prox    36                                     +-------------+----------+--------+--------+--------+  Ulnar Mid     31                                     +-------------+----------+--------+--------+--------+   Left Pre-Dialysis Findings:  +----------------------+---------+-------------------+----------+--------------+  Location               PSV       Intralum. Diam.     Waveform   Comments                                (cm/s)    (cm)                                           +----------------------+---------+-------------------+----------+--------------+  Brachial Antecub.      37        0.40                biphasic                    fossa                                                                           +----------------------+---------+-------------------+----------+--------------+  Radial Art at Wrist    30        0.12                monophasic calcific  plaque          +----------------------+---------+-------------------+----------+--------------+  Ulnar Art at Wrist     0                             absent     calcific                                                                         plaque          +----------------------+---------+-------------------+----------+--------------+  Summary:  Right: Medial calcification noted in both radial and ulnar arteries.        Distal ulnar artery is occluded. Left: Medial calcification noted in both radial and ulnar arteries.       Distal ulnar artery is occluded. *See table(s) above for measurements and observations. Electronically signed by Deitra Mayo MD on 01/27/2022 at 2:43:37 PM.    Final    ECHOCARDIOGRAM COMPLETE  Result Date: 01/17/2022    ECHOCARDIOGRAM REPORT   Patient Name:   Monique Cabrera Date of Exam: 01/17/2022 Medical Rec #:  [id number]        Height:       65.0 in Accession #:    [id number]       Weight:       134.7 lb Date of Birth:  1935-01-22        BSA:          1.672 m Patient Age:    71 years         BP:           143/114 mmHg Patient Gender: F                HR:           97 bpm. Exam Location:  Inpatient Procedure: 2D Echo, Cardiac Doppler, Color Doppler and 3D Echo Indications:     CHF-Acute Diastolic M76.72  History:        Patient has prior history of Echocardiogram examinations, most                 recent 02/09/2020. Stroke and PAD, Signs/Symptoms:Edema and                 Altered Mental Status; Risk Factors:Diabetes, Hypertension and                 Dyslipidemia.  Sonographer:    Merrie Roof RDCS Referring Phys: [place] Comments: Image acquisition challenging due to uncooperative patient. The patient woke up and sat straight up and would not lay back down to finish the echo protocol. IMPRESSIONS  1. Septal and apical hypokinesis EF has decreased since echo done 02/09/20 . Left ventricular ejection fraction, by estimation, is 40 to 45%. The left ventricle has mildly decreased function. The left ventricle has no regional wall motion abnormalities. The left ventricular internal cavity size was mildly dilated. There is mild asymmetric left ventricular hypertrophy of the basal and septal segments. Left ventricular diastolic parameters were normal.  2. Right ventricular systolic function is normal. The right ventricular size is normal.  3. Left atrial size was mildly dilated.  4. The mitral valve is abnormal.  Trivial mitral valve regurgitation. No evidence of mitral stenosis.  5. The aortic valve is tricuspid. There is moderate calcification of the aortic valve. There is moderate thickening of the aortic valve. Aortic valve regurgitation is trivial. Aortic valve sclerosis/calcification is present, without any evidence of aortic stenosis.  6. The inferior vena cava is normal in size with greater than 50% respiratory variability, suggesting right atrial pressure of 3 mmHg. FINDINGS  Left Ventricle: Septal and apical hypokinesis EF has decreased since echo done 02/09/20. Left ventricular ejection fraction, by estimation, is 40 to 45%. The left ventricle has mildly decreased function. The left ventricle has no regional wall motion abnormalities. The left ventricular internal cavity  size was mildly dilated. There is mild asymmetric left ventricular hypertrophy of the basal and septal segments. Left ventricular diastolic parameters were normal. Right Ventricle: The right ventricular size is normal. No increase in right ventricular wall thickness. Right ventricular systolic function is normal. Left Atrium: Left atrial size was mildly dilated. Right Atrium: Right atrial size was normal in size. Pericardium: There is no evidence of pericardial effusion. Mitral Valve: The mitral valve is abnormal. There is mild thickening of the mitral valve leaflet(s). There is mild calcification of the mitral valve leaflet(s). Trivial mitral valve regurgitation. No evidence of mitral valve stenosis. Tricuspid Valve: The tricuspid valve is normal in structure. Tricuspid valve regurgitation is mild . No evidence of tricuspid stenosis. Aortic Valve: The aortic valve is tricuspid. There is moderate calcification of the aortic valve. There is moderate thickening of the aortic valve. Aortic valve regurgitation is trivial. Aortic valve sclerosis/calcification is present, without any evidence of aortic stenosis. Pulmonic Valve: The pulmonic valve was normal in structure. Pulmonic valve regurgitation is mild. No evidence of pulmonic stenosis. Aorta: The aortic root is normal in size and structure. Venous: The inferior vena cava is normal in size with greater than 50% respiratory variability, suggesting right atrial pressure of 3 mmHg. IAS/Shunts: No atrial level shunt detected by color flow Doppler.  LEFT VENTRICLE PLAX 2D LVIDd:         3.70 cm LVIDs:         2.90 cm LV PW:         1.30 cm LV IVS:        1.20 cm LVOT diam:     1.80 cm     3D Volume EF: LVOT Area:     2.54 cm    3D EF:        43 %                            LV EDV:       134 ml                            LV ESV:       76 ml LV Volumes (MOD)           LV SV:        58 ml LV vol d, MOD A4C: 91.7 ml LV vol s, MOD A4C: 56.5 ml LV SV MOD A4C:     91.7 ml LEFT  ATRIUM         Index LA diam:    4.10 cm 2.45 cm/m   AORTA Ao Root diam: 3.20 cm Ao Asc diam:  2.90 cm  SHUNTS Systemic Diam: 1.80 cm Jenkins Rouge MD Electronically signed by Jenkins Rouge  MD Signature Date/Time: 01/17/2022/11:57:43 AM    Final    VAS [place] UPPER EXT VEIN MAPPING (PRE-OP AVF)  Result Date: 01/28/2022 UPPER EXTREMITY VEIN MAPPING Patient Name:  Monique Cabrera  Date of Exam:   01/28/2022 Medical Rec #: [id number]         Accession #:    [id number] Date of Birth: [birth year]         Patient Gender: F Patient Age:   9 years Exam Location:  [facility] Procedure:      VAS [place] UPPER EXT VEIN MAPPING (PRE-OP AVF) Referring Phys: Harrell Gave DICKSON --------------------------------------------------------------------------------  Indications: Pre-access. Comparison Study: No prior study Performing Technologist: Maudry Mayhew MHA, RDMS, RVT, RDCS  Examination Guidelines: A complete evaluation includes B-mode imaging, spectral Doppler, color Doppler, and power Doppler as needed of all accessible portions of each vessel. Bilateral testing is considered an integral part of a complete examination. Limited examinations for reoccurring indications may be performed as noted. +-----------------+-------------+----------+--------------+  Right Cephalic    Diameter (cm) Depth (cm)    Findings     +-----------------+-------------+----------+--------------+  Shoulder              0.15                                 +-----------------+-------------+----------+--------------+  Prox upper arm        0.10                                 +-----------------+-------------+----------+--------------+  Mid upper arm         0.07                                 +-----------------+-------------+----------+--------------+  Dist upper arm                             not visualized  +-----------------+-------------+----------+--------------+  Antecubital fossa                          not visualized   +-----------------+-------------+----------+--------------+  Prox forearm                               not visualized  +-----------------+-------------+----------+--------------+  Mid forearm                                not visualized  +-----------------+-------------+----------+--------------+  Dist forearm                               not visualized  +-----------------+-------------+----------+--------------+  Wrist                                      not visualized  +-----------------+-------------+----------+--------------+ +-----------------+-------------+----------+--------------+  Right Basilic     Diameter (cm) Depth (cm)    Findings     +-----------------+-------------+----------+--------------+  Prox upper arm        0.25                                 +-----------------+-------------+----------+--------------+  Mid upper arm         0.21                                 +-----------------+-------------+----------+--------------+  Dist upper arm                             not visualized  +-----------------+-------------+----------+--------------+  Antecubital fossa                          not visualized  +-----------------+-------------+----------+--------------+  Prox forearm                               not visualized  +-----------------+-------------+----------+--------------+  Mid forearm                                not visualized  +-----------------+-------------+----------+--------------+  Distal forearm                             not visualized  +-----------------+-------------+----------+--------------+  Wrist                                      not visualized  +-----------------+-------------+----------+--------------+ +-----------------+-------------+----------+--------------+  Left Cephalic     Diameter (cm) Depth (cm)    Findings     +-----------------+-------------+----------+--------------+  Shoulder              0.09                                  +-----------------+-------------+----------+--------------+  Prox upper arm        0.05                                 +-----------------+-------------+----------+--------------+  Mid upper arm         0.11                                 +-----------------+-------------+----------+--------------+  Dist upper arm        0.11                    thrombus     +-----------------+-------------+----------+--------------+  Antecubital fossa     0.11                    thrombus     +-----------------+-------------+----------+--------------+  Prox forearm          0.29                    thrombus     +-----------------+-------------+----------+--------------+  Mid forearm           0.22                                 +-----------------+-------------+----------+--------------+  Dist forearm  not visualized  +-----------------+-------------+----------+--------------+  Wrist                                      not visualized  +-----------------+-------------+----------+--------------+ +-----------------+-------------+----------+--------------+  Left Basilic      Diameter (cm) Depth (cm)    Findings     +-----------------+-------------+----------+--------------+  Mid upper arm         0.26                                 +-----------------+-------------+----------+--------------+  Dist upper arm        0.21                                 +-----------------+-------------+----------+--------------+  Antecubital fossa     0.30                                 +-----------------+-------------+----------+--------------+  Prox forearm                               not visualized  +-----------------+-------------+----------+--------------+  Mid forearm                                not visualized  +-----------------+-------------+----------+--------------+  Distal forearm                             not visualized  +-----------------+-------------+----------+--------------+  Wrist                                       not visualized  +-----------------+-------------+----------+--------------+ *See table(s) above for measurements and observations.  Diagnosing physician: Orlie Pollen Electronically signed by Orlie Pollen on 01/28/2022 at 5:52:04 PM.    Final     Microbiology: Results for orders placed or performed during the hospital encounter of 01/15/22  Resp Panel by RT-PCR (Flu A&B, Covid) Nasopharyngeal Swab     Status: None   Collection Time: 01/15/22  1:36 PM   Specimen: Nasopharyngeal Swab; Nasopharyngeal(NP) swabs in vial transport medium  Result Value Ref Range Status   SARS Coronavirus 2 by RT PCR NEGATIVE NEGATIVE Final    Comment: (NOTE) SARS-CoV-2 target nucleic acids are NOT DETECTED.  The SARS-CoV-2 RNA is generally detectable in upper respiratory specimens during the acute phase of infection. The lowest concentration of SARS-CoV-2 viral copies this assay can detect is 138 copies/mL. A negative result does not preclude SARS-Cov-2 infection and should not be used as the sole basis for treatment or other patient management decisions. A negative result may occur with  improper specimen collection/handling, submission of specimen other than nasopharyngeal swab, presence of viral mutation(s) within the areas targeted by this assay, and inadequate number of viral copies(<138 copies/mL). A negative result must be combined with clinical observations, patient history, and epidemiological information. The expected result is Negative.  Fact Sheet for Patients:  EntrepreneurPulse.com.au  Fact Sheet for Healthcare Providers:  IncredibleEmployment.be  This test is no t yet approved or cleared by the [place]  FDA and  has been authorized for detection and/or diagnosis of SARS-CoV-2 by FDA under an Emergency Use Authorization (EUA). This EUA will remain  in effect (meaning this test can be used) for the duration of the COVID-19 declaration  under Section 564(b)(1) of the Act, 21 U.S.C.section 360bbb-3(b)(1), unless the authorization is terminated  or revoked sooner.       Influenza A by PCR NEGATIVE NEGATIVE Final   Influenza B by PCR NEGATIVE NEGATIVE Final    Comment: (NOTE) The Xpert Xpress SARS-CoV-2/FLU/RSV plus assay is intended as an aid in the diagnosis of influenza from Nasopharyngeal swab specimens and should not be used as a sole basis for treatment. Nasal washings and aspirates are unacceptable for Xpert Xpress SARS-CoV-2/FLU/RSV testing.  Fact Sheet for Patients: EntrepreneurPulse.com.au  Fact Sheet for Healthcare Providers: IncredibleEmployment.be  This test is not yet approved or cleared by the [place] FDA and has been authorized for detection and/or diagnosis of SARS-CoV-2 by FDA under an Emergency Use Authorization (EUA). This EUA will remain in effect (meaning this test can be used) for the duration of the COVID-19 declaration under Section 564(b)(1) of the Act, 21 U.S.C. section 360bbb-3(b)(1), unless the authorization is terminated or revoked.  Performed at [facility], [place] [address] [place], [place] [address]     Labs: CBC: Recent Labs  Lab 01/25/22 0600 01/26/22 0706 01/27/22 0305 01/28/22 0500 01/29/22 0500  WBC 8.2 7.7 7.7 8.3 9.5  NEUTROABS 5.6 5.2 5.0 5.8 6.8  HGB 8.1* 8.2* 8.1* 8.2* 8.4*  HCT 24.6* 25.9* 25.5* 26.0* 26.2*  MCV 95.7 97.4 97.3 98.5 97.8  PLT 163 170 163 176 235   Basic Metabolic Panel: Recent Labs  Lab 01/25/22 0559 01/26/22 0706 01/27/22 0305 01/28/22 0500 01/29/22 0500  NA 133* 133* 135 133* 133*  K 3.4* 3.4* 3.6 3.8 3.9  CL 97* 98 99 96* 98  CO2 24 25 24  21* 20*  GLUCOSE 120* 122* 100* 114* 124*  BUN 26* 12 17 24* 30*  CREATININE 5.01* 3.42* 4.11* 5.10* 6.00*  CALCIUM 8.5* 8.6* 9.0 8.8* 8.9  PHOS 3.9 2.7 3.2 3.8 4.3   Liver Function Tests: Recent Labs  Lab 01/25/22 0559 01/26/22 0706  01/27/22 0305 01/28/22 0500 01/29/22 0500  ALBUMIN 2.4* 2.4* 2.5* 2.7* 2.8*   CBG: Recent Labs  Lab 01/28/22 2039 01/29/22 0002 01/29/22 0429 01/29/22 1046 01/29/22 1207  GLUCAP 115* 121* 127* 123* 136*    Discharge time spent: greater than 30 minutes.  Signed: Mills Mitton J [place] DO [facility] 01/29/2022

## 2022-01-30 ENCOUNTER — Telehealth (HOSPITAL_COMMUNITY): Payer: Self-pay | Admitting: Nephrology

## 2022-01-30 DIAGNOSIS — Z992 Dependence on renal dialysis: Secondary | ICD-10-CM | POA: Diagnosis not present

## 2022-01-30 DIAGNOSIS — N2581 Secondary hyperparathyroidism of renal origin: Secondary | ICD-10-CM | POA: Diagnosis not present

## 2022-01-30 DIAGNOSIS — D631 Anemia in chronic kidney disease: Secondary | ICD-10-CM | POA: Diagnosis not present

## 2022-01-30 DIAGNOSIS — T8249XD Other complication of vascular dialysis catheter, subsequent encounter: Secondary | ICD-10-CM | POA: Diagnosis not present

## 2022-01-30 DIAGNOSIS — D689 Coagulation defect, unspecified: Secondary | ICD-10-CM | POA: Diagnosis not present

## 2022-01-30 DIAGNOSIS — E1122 Type 2 diabetes mellitus with diabetic chronic kidney disease: Secondary | ICD-10-CM | POA: Diagnosis not present

## 2022-01-30 DIAGNOSIS — N186 End stage renal disease: Secondary | ICD-10-CM | POA: Diagnosis not present

## 2022-01-30 DIAGNOSIS — D509 Iron deficiency anemia, unspecified: Secondary | ICD-10-CM | POA: Diagnosis not present

## 2022-01-30 NOTE — Telephone Encounter (Signed)
Transition of care contact from inpatient facility  Date of Discharge: 01/29/22 Date of Contact:01/30/22 - attempted Method of contact: Phone  Attempted to contact patient to discuss transition of care from inpatient admission. Patient did not answer the phone. Message was left on the patient's voicemail with call back number [phone].   Veneta Penton, PA-C [employer] Pager [phone]

## 2022-02-01 ENCOUNTER — Telehealth: Payer: Self-pay | Admitting: *Deleted

## 2022-02-01 DIAGNOSIS — J9811 Atelectasis: Secondary | ICD-10-CM | POA: Diagnosis not present

## 2022-02-01 DIAGNOSIS — I088 Other rheumatic multiple valve diseases: Secondary | ICD-10-CM | POA: Diagnosis not present

## 2022-02-01 DIAGNOSIS — I214 Non-ST elevation (NSTEMI) myocardial infarction: Secondary | ICD-10-CM | POA: Diagnosis not present

## 2022-02-01 DIAGNOSIS — E1122 Type 2 diabetes mellitus with diabetic chronic kidney disease: Secondary | ICD-10-CM | POA: Diagnosis not present

## 2022-02-01 DIAGNOSIS — Z794 Long term (current) use of insulin: Secondary | ICD-10-CM | POA: Diagnosis not present

## 2022-02-01 DIAGNOSIS — Z7982 Long term (current) use of aspirin: Secondary | ICD-10-CM | POA: Diagnosis not present

## 2022-02-01 DIAGNOSIS — N186 End stage renal disease: Secondary | ICD-10-CM | POA: Diagnosis not present

## 2022-02-01 DIAGNOSIS — E440 Moderate protein-calorie malnutrition: Secondary | ICD-10-CM | POA: Diagnosis not present

## 2022-02-01 DIAGNOSIS — I5033 Acute on chronic diastolic (congestive) heart failure: Secondary | ICD-10-CM | POA: Diagnosis not present

## 2022-02-01 DIAGNOSIS — E785 Hyperlipidemia, unspecified: Secondary | ICD-10-CM | POA: Diagnosis not present

## 2022-02-01 DIAGNOSIS — Z9181 History of falling: Secondary | ICD-10-CM | POA: Diagnosis not present

## 2022-02-01 DIAGNOSIS — I700 Atherosclerosis of aorta: Secondary | ICD-10-CM | POA: Diagnosis not present

## 2022-02-01 DIAGNOSIS — Z48812 Encounter for surgical aftercare following surgery on the circulatory system: Secondary | ICD-10-CM | POA: Diagnosis not present

## 2022-02-01 DIAGNOSIS — D631 Anemia in chronic kidney disease: Secondary | ICD-10-CM | POA: Diagnosis not present

## 2022-02-01 DIAGNOSIS — Z89511 Acquired absence of right leg below knee: Secondary | ICD-10-CM | POA: Diagnosis not present

## 2022-02-01 DIAGNOSIS — Z992 Dependence on renal dialysis: Secondary | ICD-10-CM | POA: Diagnosis not present

## 2022-02-01 DIAGNOSIS — Z7902 Long term (current) use of antithrombotics/antiplatelets: Secondary | ICD-10-CM | POA: Diagnosis not present

## 2022-02-01 DIAGNOSIS — I132 Hypertensive heart and chronic kidney disease with heart failure and with stage 5 chronic kidney disease, or end stage renal disease: Secondary | ICD-10-CM | POA: Diagnosis not present

## 2022-02-01 DIAGNOSIS — K8020 Calculus of gallbladder without cholecystitis without obstruction: Secondary | ICD-10-CM | POA: Diagnosis not present

## 2022-02-01 NOTE — Telephone Encounter (Signed)
Transition Care Management Unsuccessful Follow-up Telephone Call  Date of discharge and from where:  01/29/2022 [place]  Attempts:  1st Attempt  Reason for unsuccessful TCM follow-up call:  Unable to leave message

## 2022-02-01 NOTE — Patient Outreach (Signed)
[facility]) Care Management  02/01/2022  Monique Cabrera 07-22-1935 [id number]   Received hospital referral from Netta Cedars, RN for RN case manager for chronic disease management services. Assigned to Niel Hummer, RN Care Coordinator for follow up.  Thank you, [place] Care Management Assistant

## 2022-02-02 ENCOUNTER — Encounter (HOSPITAL_COMMUNITY): Payer: Self-pay | Admitting: Vascular Surgery

## 2022-02-02 DIAGNOSIS — Z992 Dependence on renal dialysis: Secondary | ICD-10-CM | POA: Diagnosis not present

## 2022-02-02 DIAGNOSIS — N2581 Secondary hyperparathyroidism of renal origin: Secondary | ICD-10-CM | POA: Diagnosis not present

## 2022-02-02 DIAGNOSIS — D631 Anemia in chronic kidney disease: Secondary | ICD-10-CM | POA: Diagnosis not present

## 2022-02-02 DIAGNOSIS — D509 Iron deficiency anemia, unspecified: Secondary | ICD-10-CM | POA: Diagnosis not present

## 2022-02-02 DIAGNOSIS — N186 End stage renal disease: Secondary | ICD-10-CM | POA: Diagnosis not present

## 2022-02-02 DIAGNOSIS — D689 Coagulation defect, unspecified: Secondary | ICD-10-CM | POA: Diagnosis not present

## 2022-02-02 DIAGNOSIS — T8249XD Other complication of vascular dialysis catheter, subsequent encounter: Secondary | ICD-10-CM | POA: Diagnosis not present

## 2022-02-02 DIAGNOSIS — I959 Hypotension, unspecified: Secondary | ICD-10-CM | POA: Diagnosis not present

## 2022-02-02 DIAGNOSIS — E1122 Type 2 diabetes mellitus with diabetic chronic kidney disease: Secondary | ICD-10-CM | POA: Diagnosis not present

## 2022-02-02 NOTE — Telephone Encounter (Signed)
Transition Care Management Unsuccessful Follow-up Telephone Call  Date of discharge and from where:  01/29/2022 [place]  Attempts:  2nd Attempt  Reason for unsuccessful TCM follow-up call:  Unable to leave message

## 2022-02-03 ENCOUNTER — Other Ambulatory Visit: Payer: Self-pay | Admitting: *Deleted

## 2022-02-03 ENCOUNTER — Telehealth: Payer: Self-pay | Admitting: *Deleted

## 2022-02-03 DIAGNOSIS — R4020 Unspecified coma: Secondary | ICD-10-CM | POA: Diagnosis not present

## 2022-02-03 DIAGNOSIS — I499 Cardiac arrhythmia, unspecified: Secondary | ICD-10-CM | POA: Diagnosis not present

## 2022-02-03 DIAGNOSIS — Z743 Need for continuous supervision: Secondary | ICD-10-CM | POA: Diagnosis not present

## 2022-02-03 DIAGNOSIS — R404 Transient alteration of awareness: Secondary | ICD-10-CM | POA: Diagnosis not present

## 2022-02-03 DIAGNOSIS — I469 Cardiac arrest, cause unspecified: Secondary | ICD-10-CM | POA: Diagnosis not present

## 2022-02-03 NOTE — Patient Outreach (Signed)
[facility]) Care Management  February 15, 2022  Monique Cabrera 1935/02/28 [id number]   Referral Received: 02/01/2022 Initial Outreach 2022/02/15-Pt deceased  RN spoke with the son Monique Cabrera) who has indicated pt expired this morning. RN offered condolences and moral support for his loss.   No additional outreach calls needed at this time.  Raina Mina, RN Care Management Coordinator [place] Office [phone]

## 2022-02-03 NOTE — Telephone Encounter (Signed)
Transition Care Management Unsuccessful Follow-up Telephone Call  Date of discharge and from where:  01/29/2022 [facility] Cabrera  Attempts:  3rd Attempt  Reason for unsuccessful TCM follow-up call:  Unable to leave message Physical Therapist with [facility] will encourage patient to call office to schedule an appointment .

## 2022-02-03 NOTE — Telephone Encounter (Signed)
Sreejesh with Monique Cabrera called requesting verbal orders for PT 2x3weeks, 1x2weeks.  Verbal orders given. Informed him that I have been trying to get in contact with patient to schedule a TOC.   He stated that when he sees her this week he will encourage her to call our office to schedule an appointment.

## 2022-02-04 ENCOUNTER — Other Ambulatory Visit: Payer: Self-pay

## 2022-02-04 DIAGNOSIS — N189 Chronic kidney disease, unspecified: Secondary | ICD-10-CM

## 2022-02-04 NOTE — Progress Notes (Unsigned)
Cardiology Clinic Note   Patient Name: Monique Cabrera Date of Encounter: 02/04/2022  Primary Care Provider:  Wardell Honour, MD Primary Cardiologist:  Minus Breeding, MD  Patient Profile    Monique Cabrera 87 year old female presents to the clinic today for follow-up evaluation of her hypertension and peripheral artery disease.  Past Medical History    Past Medical History:  Diagnosis Date   Acute CVA (cerebrovascular accident) ([place]) 02/18/2017   Acute ischemic stroke ([place])    Acute metabolic encephalopathy 5/72/6203   Acute onset of severe vertigo 02/17/2017   Acute upper respiratory infections of unspecified site    Amputee 08/2019   Anemia    Anemia, unspecified    Arthritis    Atherosclerosis of native arteries of the extremities, unspecified    Chest pain, unspecified    Chronic kidney disease (CKD), stage II (mild)    [facility]   Diabetes mellitus without complication ([place])    Type II   Diarrhea    Disorder of bone and cartilage, unspecified    DNR (do not resuscitate)/DNI(Do Not Intubate) 04/08/2020        Gangrene of right foot ([place])    Herpes zoster with other nervous system complications(053.19)    History of blood transfusion    Hypercalcemia    Hypertension    Nonspecific reaction to tuberculin skin test without active tuberculosis(795.51)    Other and unspecified hyperlipidemia    PAD (peripheral artery disease) ([place])    Per records from [facility]   Pain in joint, lower leg    Peripheral arterial disease ([place])    Postherpetic neuralgia    Proteinuria    Stroke ([place]) 01/2017   Type II or unspecified type diabetes mellitus with renal manifestations, uncontrolled(250.42)    Unspecified disorder of kidney and ureter    Past Surgical History:  Procedure Laterality Date   ABDOMINAL AORTOGRAM W/LOWER EXTREMITY N/A 10/31/2019   Procedure: ABDOMINAL AORTOGRAM W/LOWER EXTREMITY;  Surgeon: Wellington Hampshire, MD;  Location: [place] [facility];   Service: Cardiovascular;  Laterality: N/A;   ABDOMINAL AORTOGRAM W/LOWER EXTREMITY Left 05/12/2020   Procedure: ABDOMINAL AORTOGRAM W/LOWER EXTREMITY;  Surgeon: Waynetta Sandy, MD;  Location: [place] [facility];  Service: Cardiovascular;  Laterality: Left;   AMPUTATION Right 11/02/2019   Procedure: AMPUTATION BELOW KNEE RIGHT;  Surgeon: Rosetta Posner, MD;  Location: [facility] OR;  Service: Vascular;  Laterality: Right;   AV FISTULA PLACEMENT Left 01/29/2022   Procedure: LEFT ARM ARTERIOVENOUS (AV) FISTULA WITH PLACEMENT OF GORE-TEX GRAFT (4-7MM STRETCH VASCULAR GRAFT);  Surgeon: Angelia Mould, MD;  Location: [facility] OR;  Service: Vascular;  Laterality: Left;   COLONOSCOPY     x 2   ENDARTERECTOMY FEMORAL Left 05/21/2020   Procedure: LEFT COMMON FEMORAL ENDARTERECTOMY;  Surgeon: Rosetta Posner, MD;  Location: [place];  Service: Vascular;  Laterality: Left;   FEMORAL-POPLITEAL BYPASS GRAFT Left 05/21/2020   Procedure: LEFT FEMORAL TO BELOW KNEE POPLITEAL ARTERY BYPASS GRAFT;  Surgeon: Rosetta Posner, MD;  Location: [place];  Service: Vascular;  Laterality: Left;   hysterectomy     INCISION AND DRAINAGE Left 05/27/14   sebacous cyst, ear   INSERTION OF DIALYSIS CATHETER Right 01/29/2022   Procedure: INSERTION OF PALINDROME 23CM (PRECISION CHRONIC CATHETER KIT) DIALYSIS CATHETER;  Surgeon: Angelia Mould, MD;  Location: [place];  Service: Vascular;  Laterality: Right;   IR FLUORO GUIDE CV LINE RIGHT  01/19/2022   IR FLUORO GUIDE  CV LINE RIGHT  01/20/2022   IR FLUORO GUIDE CV LINE RIGHT  01/21/2022   IR US GUIDE VASC ACCESS RIGHT  01/19/2022   IR US GUIDE VASC ACCESS RIGHT  01/20/2022   LEFT HEART CATH AND CORONARY ANGIOGRAPHY N/A 01/20/2022   Procedure: LEFT HEART CATH AND CORONARY ANGIOGRAPHY;  Surgeon: Troy Sine, MD;  Location: [place] [facility];  Service: Cardiovascular;  Laterality: N/A;   PRP Left    Dr. Ricki Miller   removal of cyst from hand     removal of tumor from foot      [place]  01/29/2022   Procedure: [place];  Surgeon: Angelia Mould, MD;  Location: [place];  Service: Vascular;;    Allergies  Allergies  Allergen Reactions   Invokana [Canagliflozin] Itching, Swelling and Other (See Comments)    Vaginal itching, swelling and irritation   Jardiance [Empagliflozin] Itching, Swelling and Other (See Comments)    Vaginal itching and swelling   Tuberculin Ppd Other (See Comments)    Per records from [place] has a PMH of essential hypertension, PAD, retinal hematoma due to diabetes, chronic diastolic CHF, NSTEMI, chronic hepatitis C, vertigo, HLD, dyspnea, and CKD stage V.  Her PMH also includes right lower extremity amputation and CVA.    She was admitted on 2123 with chest pain with evolving NSTEMI and mildly reduced EF 40-45%.  She was noted to have LAD hypokinesis that was new since 02/09/2020.  Her EKG showed sinus rhythm.  She underwent cardiac catheterization 01/20/2022 which showed RCA chronic total occlusion with left-right collaterals, 80% OM1, mild LAD disease, and medical management was recommended.  She was discharged on 01/29/2022 and also underwent left arm AV fistula placement  She presents to the clinic today for follow-up evaluation and states***  *** denies chest pain, shortness of breath, lower extremity edema, fatigue, palpitations, melena, hematuria, hemoptysis, diaphoresis, weakness, presyncope, syncope, orthopnea, and PND.   Home Medications    Prior to Admission medications   Medication Sig Start Date End Date Taking? Authorizing Provider  acetaminophen (TYLENOL) 500 MG tablet Take 1,000 mg by mouth every 6 (six) hours as needed for moderate pain or headache.    [provider]  acetaminophen (TYLENOL) 650 MG CR tablet Take 1,300 mg by mouth every 8 (eight) hours as needed for pain.     [provider]  ASPIRIN LOW DOSE 81 MG EC tablet Take 81 mg by mouth daily. 02/29/20   [provider]  atorvastatin (LIPITOR) 80 MG tablet Take 1 tablet (80 mg total) by mouth daily. 01/30/22 04/30/22  [place] Eric J, DO  calcitRIOL (ROCALTROL) 0.25 MCG capsule Take 0.25 mcg by mouth daily.    [provider]  calcium acetate (PHOSLO) 667 MG capsule Take 1 capsule (667 mg total) by mouth 3 (three) times daily with meals. 01/29/22 04/29/22  [place] Eric J, DO  clopidogrel (PLAVIX) 75 MG tablet Take 1 tablet (75 mg total) by mouth daily. 09/09/21   Wardell Honour, MD  feeding supplement, GLUCERNA SHAKE, (GLUCERNA SHAKE) LIQD Take 237 mLs by mouth as needed (nutritional support). strawberry    [provider]  glucose 5 g chewable tablet Chew 15 g by mouth as needed for low blood sugar.    [provider]  glucose blood (ONETOUCH VERIO) test strip USE TO  TEST BLOOD SUGAR THREE TIMES DAILY. DX: E11.9 Patient taking differently: daily after breakfast. 06/05/21   Wardell Honour, MD  hydrALAZINE (APRESOLINE) 100 MG tablet Take 1 tablet (100 mg total) by mouth every 8 (eight) hours. 01/29/22 04/29/22  [place] Eric J, DO  insulin lispro (HUMALOG) 100 UNIT/ML injection Inject 0.03 mLs (3 Units total) into the skin 3 (three) times daily after meals. Patient taking differently: Inject 3 Units into the skin See admin instructions. 3 times daily with meals **Do not take if blood sugar is less 150** 04/03/20   Reed, Tiffany L, DO  Insulin Pen Needle (B-D ULTRAFINE III SHORT PEN) 31G X 8 MM MISC Use to check blood sugar every day. Dx: 11.29; 11.65 07/04/20   Reed, Tiffany L, DO  LANTUS SOLOSTAR 100 UNIT/ML Solostar Pen Inject 20 Units into the skin daily. 12/25/21   Ngetich, [name] C, NP  metoprolol tartrate (LOPRESSOR) 50 MG tablet Take 1 tablet (50 mg total) by mouth 2 (two) times daily. 01/29/22 04/29/22  [place] Eric J, DO  Multiple Vitamin (MULTIVITAMIN WITH MINERALS) TABS tablet  Take 1 tablet by mouth daily.    [provider]  multivitamin (RENA-VIT) TABS tablet Take 1 tablet by mouth at bedtime. 01/29/22 04/29/22  [place] Donnamarie Poag, DO  pantoprazole (PROTONIX) 40 MG tablet Take 1 tablet (40 mg total) by mouth daily. 01/30/22 04/30/22  [place] Eric J, DO    Family History    Family History  Problem Relation Age of Onset   Diabetes Mother    Diabetes Father    Diabetes Sister    Diabetes Sister    She indicated that her mother is deceased. She indicated that her father is deceased. She indicated that only one of her four sisters is alive. She indicated that her brother is deceased. She indicated that her daughter is deceased. She indicated that her son is alive.  Social History    Social History   Socioeconomic History   Marital status: Widowed    Spouse name: Not on file   Number of children: 6   Years of education: 15   Highest education level: Not on file  Occupational History    Comment: retired, [employer]  Tobacco Use   Smoking status: Former    Types: Cigarettes   Smokeless tobacco: Never   Tobacco comments:    Quit about age 23  " it was really a habit"  Vaping Use   Vaping Use: Never used  Substance and Sexual Activity   Alcohol use: No    Alcohol/week: 0.0 standard drinks   Drug use: No   Sexual activity: Never  Other Topics Concern   Not on file  Social History Narrative   Has moved in with her daughter.   Caffeine- coffee 2-3 cups daily, soda off and on   Social Determinants of Health   Financial Resource Strain: Low Risk    Difficulty of Paying Living Expenses: Not hard at all  Food Insecurity: No Food Insecurity   Worried About Charity fundraiser in the Last Year: Never true   Ran Out of Food in the Last Year: Never true  Transportation Needs: No Transportation Needs   Lack of Transportation (Medical): No   Lack of Transportation (Non-Medical): No  Physical Activity: Not on file  Stress: Not on file  Social Connections: Not  on file  Intimate Partner Violence: Not on file     Review of Systems    General:  No chills, fever,  night sweats or weight changes.  Cardiovascular:  No chest pain, dyspnea on exertion, edema, orthopnea, palpitations, paroxysmal nocturnal dyspnea. Dermatological: No rash, lesions/masses Respiratory: No cough, dyspnea Urologic: No hematuria, dysuria Abdominal:   No nausea, vomiting, diarrhea, bright red blood per rectum, melena, or hematemesis Neurologic:  No visual changes, wkns, changes in mental status. All other systems reviewed and are otherwise negative except as noted above.  Physical Exam    VS:  There were no vitals taken for this visit. , BMI There is no height or weight on file to calculate BMI. GEN: Well nourished, well developed, in no acute distress. HEENT: normal. Neck: Supple, no JVD, carotid bruits, or masses. Cardiac: RRR, no murmurs, rubs, or gallops. No clubbing, cyanosis, edema.  Radials/DP/PT 2+ and equal bilaterally.  Respiratory:  Respirations regular and unlabored, clear to auscultation bilaterally. GI: Soft, nontender, nondistended, BS + x 4. MS: no deformity or atrophy. Skin: warm and dry, no rash. Neuro:  Strength and sensation are intact. Psych: Normal affect.  Accessory Clinical Findings    Recent Labs: 01/15/2022: B Natriuretic Peptide >4,500.0 01/16/2022: ALT 11; Magnesium 1.9 01/29/2022: BUN 30; Creatinine, Ser 6.00; Hemoglobin 8.4; Platelets 177; Potassium 3.9; Sodium 133   Recent Lipid Panel    Component Value Date/Time   CHOL 160 01/17/2022 0249   CHOL 204 (H) 11/24/2015 0938   TRIG 82 01/17/2022 0249   HDL 61 01/17/2022 0249   HDL 53 11/24/2015 0938   CHOLHDL 2.6 01/17/2022 0249   VLDL 16 01/17/2022 0249   LDLCALC 83 01/17/2022 0249   LDLCALC 68 04/06/2021 1216    ECG personally reviewed by me today- *** - No acute changes  Echocardiogram 12/17/2022 1. Septal and apical hypokinesis EF has decreased since echo done 02/09/20  . Left  ventricular ejection fraction, by estimation, is 40 to 45%. The  left ventricle has mildly decreased function. The left ventricle has no  regional wall motion abnormalities.  The left ventricular internal cavity size was mildly dilated. There is  mild asymmetric left ventricular hypertrophy of the basal and septal  segments. Left ventricular diastolic parameters were normal.   2. Right ventricular systolic function is normal. The right ventricular  size is normal.   3. Left atrial size was mildly dilated.   4. The mitral valve is abnormal. Trivial mitral valve regurgitation. No  evidence of mitral stenosis.   5. The aortic valve is tricuspid. There is moderate calcification of the  aortic valve. There is moderate thickening of the aortic valve. Aortic  valve regurgitation is trivial. Aortic valve sclerosis/calcification is  present, without any evidence of  aortic stenosis.   6. The inferior vena cava is normal in size with greater than 50%  respiratory variability, suggesting right atrial pressure of 3 mmHg  Cardiac catheterization 01/20/2022   Prox LAD to Mid LAD lesion is 50% stenosed.   1st Mrg lesion is 80% stenosed.   RPDA lesion is 100% stenosed.   Ost RCA lesion is 100% stenosed.   LV end diastolic pressure is moderately elevated.   There is evidence for severe multivessel coronary calcification and vessel tortuosity.   The LAD has proximal 50% stenosis.  The left circumflex vessel gives rise to a proximal marginal vessel which has focal 80% stenosis on a bend in the vessel; the RCA is totally occluded proximally.  There is extensive left to right collateralization filling the entire RCA up to the near ostial total occlusion.   Elevated LVEDP at 26 mm.  RECOMMENDATION: Patient is to initiate dialysis today.  Commend optimal blood pressure control and lipid management.  The patient has been on DAPT with aspirin/Plavix and would continue as long as no significant  bleeding.  Diagnostic Dominance: Right Intervention  Assessment & Plan   1.  NSTEMI-denies chest pain today.  Underwent cardiac catheterization 01/20/2022 which showed chronic total occlusion of the right coronary artery with left-right collaterals, OM1 80% lesion.  Medical management was recommended.  Her high-sensitivity troponins peaked at 6368 on 122.  Her echocardiogram 122 showed an EF of 40-45% with LAD territory hypokinesis which was new from her previous echo. Continue aspirin, Plavix, beta-blocker Heart healthy low-sodium diet-salty 6 given Increase physical activity as tolerated  Essential hypertension-BP today***.  Controlled with metoprolol, hydralazine Continue amlodipine, clonidine, Benicar Heart healthy low-sodium diet-salty 6 given Increase physical activity as tolerated Maintain blood pressure log  Hyperlipidemia-01/17/2022: Cholesterol 160; HDL 61; LDL Cholesterol 83; Triglycerides 82; VLDL 16 Low-sodium high-fiber diet  CKD stage V-creatinine 6.00 on 01/29/2022.  She received inpatient hemodialysis during her admission.  It was felt that if she did not tolerate inpatient hemodialysis a palliative care consult and consideration of comfort measures would be appropriate.  AV fistula placed 01/29/2022   Disposition: Follow-up with Dr. Percival Spanish in 2-3 months.   Jossie Ng. [place] NP-C    02/04/2022, 12:23 PM [address] Office [phone] Fax [phone]  Notice: This dictation was prepared with Dragon dictation along with smaller phrase technology. Any transcriptional errors that result from this process are unintentional and may not be corrected upon review.  I spent***minutes examining this patient, reviewing medications, and using patient centered shared decision making involving her cardiac care.  Prior to her visit I spent greater than 20 minutes reviewing her past medical history,  medications, and prior cardiac  tests.

## 2022-02-05 ENCOUNTER — Telehealth: Payer: Self-pay | Admitting: *Deleted

## 2022-02-05 NOTE — Telephone Encounter (Signed)
Eliezer Lofts, Rehab Manager with [facility] called and stated that patient passed away in her sleep last night.   FYI

## 2022-02-08 ENCOUNTER — Ambulatory Visit: Payer: Medicare Other | Admitting: General Practice

## 2022-02-11 ENCOUNTER — Encounter: Payer: Medicare Other | Admitting: Vascular Surgery

## 2022-02-11 ENCOUNTER — Other Ambulatory Visit (HOSPITAL_COMMUNITY): Payer: Medicare Other

## 2022-02-23 DIAGNOSIS — Z992 Dependence on renal dialysis: Secondary | ICD-10-CM | POA: Diagnosis not present

## 2022-02-23 DIAGNOSIS — N186 End stage renal disease: Secondary | ICD-10-CM | POA: Diagnosis not present

## 2022-02-23 DIAGNOSIS — I120 Hypertensive chronic kidney disease with stage 5 chronic kidney disease or end stage renal disease: Secondary | ICD-10-CM | POA: Diagnosis not present

## 2022-02-24 DEATH — deceased

## 2022-03-03 ENCOUNTER — Telehealth: Payer: Self-pay

## 2022-03-03 NOTE — Telephone Encounter (Signed)
Monique Cabrera with [facility] states they are waiting to close a case on the attached patient from early February 2023. ? ?Monique Cabrera states a plan of care has been faxed to [place] several times, however they never receive it back. Order number for documents is [phone]. ? ?I checked the outgoing fax bin and Dr.Miller's desk and was unable to locate any paperwork on patient that requires a signature. I asked Monique Cabrera to refax and we will have Dr.Miller review and sign. Fax number was confirmed.  ? ? ? ?

## 2022-03-04 NOTE — Telephone Encounter (Signed)
Below is Dr.Miller's response sent via routing comment: ? ?Wardell Honour, MD  You Just now (1:53 PM)  ? ?They asked for an ongoing order; I returned the form without signing to the outgoing basket in the provider office noting that she has expired   ? ?Forms were received yesterday and indicate the dates of service that was provided prior to patients expiration date. Forms are in your review and sign folder for Tuesday  ?

## 2022-03-05 ENCOUNTER — Encounter: Payer: Medicare Other | Admitting: Family

## 2022-04-27 ENCOUNTER — Ambulatory Visit: Payer: Medicare Other | Admitting: Family Medicine
